# Patient Record
Sex: Male | Born: 1965 | Race: White | Hispanic: No | Smoking: Former smoker
Health system: Southern US, Community
[De-identification: ages and names within clinical notes are randomized; demographics above are authoritative.]

## PROBLEM LIST (undated history)

## (undated) DIAGNOSIS — K860 Alcohol-induced chronic pancreatitis: Secondary | ICD-10-CM

## (undated) DIAGNOSIS — E119 Type 2 diabetes mellitus without complications: Secondary | ICD-10-CM

## (undated) DIAGNOSIS — R609 Edema, unspecified: Secondary | ICD-10-CM

## (undated) DIAGNOSIS — M1990 Unspecified osteoarthritis, unspecified site: Secondary | ICD-10-CM

## (undated) DIAGNOSIS — E785 Hyperlipidemia, unspecified: Secondary | ICD-10-CM

## (undated) DIAGNOSIS — S83242A Other tear of medial meniscus, current injury, left knee, initial encounter: Secondary | ICD-10-CM

## (undated) DIAGNOSIS — F419 Anxiety disorder, unspecified: Secondary | ICD-10-CM

## (undated) DIAGNOSIS — G4730 Sleep apnea, unspecified: Secondary | ICD-10-CM

## (undated) DIAGNOSIS — N529 Male erectile dysfunction, unspecified: Secondary | ICD-10-CM

## (undated) DIAGNOSIS — T7840XA Allergy, unspecified, initial encounter: Secondary | ICD-10-CM

## (undated) DIAGNOSIS — F32A Depression, unspecified: Secondary | ICD-10-CM

## (undated) DIAGNOSIS — E039 Hypothyroidism, unspecified: Secondary | ICD-10-CM

## (undated) DIAGNOSIS — R011 Cardiac murmur, unspecified: Secondary | ICD-10-CM

## (undated) DIAGNOSIS — R002 Palpitations: Secondary | ICD-10-CM

## (undated) DIAGNOSIS — G709 Myoneural disorder, unspecified: Secondary | ICD-10-CM

## (undated) DIAGNOSIS — I10 Essential (primary) hypertension: Secondary | ICD-10-CM

## (undated) DIAGNOSIS — R0789 Other chest pain: Secondary | ICD-10-CM

## (undated) DIAGNOSIS — F329 Major depressive disorder, single episode, unspecified: Secondary | ICD-10-CM

## (undated) DIAGNOSIS — E049 Nontoxic goiter, unspecified: Secondary | ICD-10-CM

## (undated) DIAGNOSIS — K219 Gastro-esophageal reflux disease without esophagitis: Secondary | ICD-10-CM

## (undated) DIAGNOSIS — R0989 Other specified symptoms and signs involving the circulatory and respiratory systems: Secondary | ICD-10-CM

## (undated) DIAGNOSIS — K760 Fatty (change of) liver, not elsewhere classified: Secondary | ICD-10-CM

## (undated) DIAGNOSIS — R072 Precordial pain: Secondary | ICD-10-CM

## (undated) HISTORY — DX: Allergy, unspecified, initial encounter: T78.40XA

## (undated) HISTORY — DX: Precordial pain: R07.2

## (undated) HISTORY — DX: Other tear of medial meniscus, current injury, left knee, initial encounter: S83.242A

## (undated) HISTORY — DX: Cardiac murmur, unspecified: R01.1

## (undated) HISTORY — PX: CARDIAC CATHETERIZATION: SHX172

## (undated) HISTORY — DX: Anxiety disorder, unspecified: F41.9

## (undated) HISTORY — DX: Edema, unspecified: R60.9

## (undated) HISTORY — DX: Major depressive disorder, single episode, unspecified: F32.9

## (undated) HISTORY — PX: WISDOM TOOTH EXTRACTION: SHX21

## (undated) HISTORY — DX: Depression, unspecified: F32.A

## (undated) HISTORY — DX: Sleep apnea, unspecified: G47.30

## (undated) HISTORY — DX: Palpitations: R00.2

## (undated) HISTORY — DX: Other chest pain: R07.89

## (undated) HISTORY — DX: Other specified symptoms and signs involving the circulatory and respiratory systems: R09.89

## (undated) HISTORY — DX: Type 2 diabetes mellitus without complications: E11.9

## (undated) HISTORY — DX: Male erectile dysfunction, unspecified: N52.9

## (undated) HISTORY — DX: Hyperlipidemia, unspecified: E78.5

## (undated) HISTORY — PX: COLONOSCOPY: SHX174

## (undated) HISTORY — PX: TONSILLECTOMY: SUR1361

## (undated) HISTORY — PX: JOINT REPLACEMENT: SHX530

## (undated) HISTORY — DX: Nontoxic goiter, unspecified: E04.9

---
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
## 2000-04-24 HISTORY — PX: APPENDECTOMY: SHX54

## 2004-05-10 ENCOUNTER — Emergency Department: Payer: Self-pay | Admitting: Emergency Medicine

## 2004-05-17 ENCOUNTER — Ambulatory Visit: Payer: Self-pay | Admitting: Internal Medicine

## 2005-01-12 ENCOUNTER — Encounter: Admission: RE | Admit: 2005-01-12 | Discharge: 2005-01-12 | Payer: Self-pay | Admitting: Neurosurgery

## 2005-01-27 ENCOUNTER — Encounter: Admission: RE | Admit: 2005-01-27 | Discharge: 2005-01-27 | Payer: Self-pay | Admitting: Neurosurgery

## 2005-02-09 ENCOUNTER — Encounter: Admission: RE | Admit: 2005-02-09 | Discharge: 2005-02-09 | Payer: Self-pay | Admitting: Neurosurgery

## 2005-06-16 ENCOUNTER — Ambulatory Visit (HOSPITAL_COMMUNITY): Admission: RE | Admit: 2005-06-16 | Discharge: 2005-06-16 | Payer: Self-pay | Admitting: Neurosurgery

## 2005-10-15 ENCOUNTER — Other Ambulatory Visit: Payer: Self-pay

## 2005-10-15 ENCOUNTER — Emergency Department: Payer: Self-pay | Admitting: Emergency Medicine

## 2006-01-12 ENCOUNTER — Ambulatory Visit: Payer: Self-pay | Admitting: Internal Medicine

## 2006-01-19 ENCOUNTER — Ambulatory Visit: Payer: Self-pay | Admitting: Internal Medicine

## 2006-01-25 ENCOUNTER — Encounter: Payer: Self-pay | Admitting: Cardiology

## 2006-01-25 ENCOUNTER — Ambulatory Visit: Payer: Self-pay

## 2006-02-06 ENCOUNTER — Ambulatory Visit (HOSPITAL_COMMUNITY): Admission: RE | Admit: 2006-02-06 | Discharge: 2006-02-06 | Payer: Self-pay | Admitting: Internal Medicine

## 2006-05-02 ENCOUNTER — Emergency Department: Payer: Self-pay | Admitting: Emergency Medicine

## 2007-01-05 ENCOUNTER — Emergency Department: Payer: Self-pay | Admitting: Emergency Medicine

## 2007-01-05 ENCOUNTER — Other Ambulatory Visit: Payer: Self-pay

## 2007-01-08 ENCOUNTER — Ambulatory Visit: Payer: Self-pay | Admitting: Internal Medicine

## 2007-01-08 LAB — CONVERTED CEMR LAB
Basophils Absolute: 0 10*3/uL (ref 0.0–0.1)
Basophils Relative: 0 % (ref 0.0–1.0)
Eosinophils Absolute: 0.2 10*3/uL (ref 0.0–0.6)
Eosinophils Relative: 3.5 % (ref 0.0–5.0)
Free T4: 0.6 ng/dL (ref 0.6–1.6)
HCT: 40.1 % (ref 39.0–52.0)
Hemoglobin: 13.6 g/dL (ref 13.0–17.0)
Lymphocytes Relative: 17 % (ref 12.0–46.0)
MCHC: 33.9 g/dL (ref 30.0–36.0)
MCV: 91.3 fL (ref 78.0–100.0)
Monocytes Absolute: 0.4 10*3/uL (ref 0.2–0.7)
Monocytes Relative: 5.9 % (ref 3.0–11.0)
Neutro Abs: 4.6 10*3/uL (ref 1.4–7.7)
Neutrophils Relative %: 73.6 % (ref 43.0–77.0)
Platelets: 354 10*3/uL (ref 150–400)
RBC: 4.39 M/uL (ref 4.22–5.81)
RDW: 11.8 % (ref 11.5–14.6)
TSH: 1.79 microintl units/mL (ref 0.35–5.50)
WBC: 6.3 10*3/uL (ref 4.5–10.5)

## 2007-01-09 ENCOUNTER — Ambulatory Visit: Payer: Self-pay | Admitting: Pulmonary Disease

## 2007-01-21 ENCOUNTER — Ambulatory Visit: Payer: Self-pay | Admitting: Pulmonary Disease

## 2007-01-21 ENCOUNTER — Ambulatory Visit (HOSPITAL_BASED_OUTPATIENT_CLINIC_OR_DEPARTMENT_OTHER): Admission: RE | Admit: 2007-01-21 | Discharge: 2007-01-21 | Payer: Self-pay | Admitting: Pulmonary Disease

## 2007-03-15 ENCOUNTER — Ambulatory Visit: Payer: Self-pay | Admitting: Pulmonary Disease

## 2007-05-06 ENCOUNTER — Ambulatory Visit: Payer: Self-pay | Admitting: Internal Medicine

## 2007-05-23 ENCOUNTER — Ambulatory Visit: Payer: Self-pay | Admitting: Internal Medicine

## 2007-05-27 ENCOUNTER — Ambulatory Visit: Payer: Self-pay | Admitting: Internal Medicine

## 2007-09-20 ENCOUNTER — Ambulatory Visit: Payer: Self-pay | Admitting: Internal Medicine

## 2007-11-04 ENCOUNTER — Ambulatory Visit: Payer: Self-pay | Admitting: Internal Medicine

## 2007-12-31 ENCOUNTER — Ambulatory Visit: Payer: Self-pay | Admitting: Internal Medicine

## 2008-01-20 ENCOUNTER — Ambulatory Visit: Payer: Self-pay | Admitting: Internal Medicine

## 2008-01-20 LAB — CONVERTED CEMR LAB
Basophils Absolute: 0 10*3/uL (ref 0.0–0.1)
Basophils Relative: 0.7 % (ref 0.0–3.0)
Eosinophils Absolute: 0.2 10*3/uL (ref 0.0–0.7)
Eosinophils Relative: 3.5 % (ref 0.0–5.0)
HCT: 36.2 % — ABNORMAL LOW (ref 39.0–52.0)
Hemoglobin: 12.6 g/dL — ABNORMAL LOW (ref 13.0–17.0)
Lymphocytes Relative: 17.6 % (ref 12.0–46.0)
MCHC: 34.7 g/dL (ref 30.0–36.0)
MCV: 91.6 fL (ref 78.0–100.0)
Monocytes Absolute: 0.5 10*3/uL (ref 0.1–1.0)
Monocytes Relative: 9.1 % (ref 3.0–12.0)
Neutro Abs: 3.7 10*3/uL (ref 1.4–7.7)
Neutrophils Relative %: 69.1 % (ref 43.0–77.0)
Platelets: 319 10*3/uL (ref 150–400)
RBC: 3.95 M/uL — ABNORMAL LOW (ref 4.22–5.81)
RDW: 11.6 % (ref 11.5–14.6)
Sed Rate: 12 mm/hr (ref 0–16)
WBC: 5.3 10*3/uL (ref 4.5–10.5)

## 2008-09-24 ENCOUNTER — Encounter: Payer: Self-pay | Admitting: Internal Medicine

## 2008-10-21 ENCOUNTER — Encounter: Payer: Self-pay | Admitting: Internal Medicine

## 2008-12-29 ENCOUNTER — Emergency Department: Payer: Self-pay

## 2008-12-29 ENCOUNTER — Encounter: Payer: Self-pay | Admitting: Internal Medicine

## 2008-12-30 ENCOUNTER — Telehealth: Payer: Self-pay | Admitting: Internal Medicine

## 2008-12-30 ENCOUNTER — Encounter: Payer: Self-pay | Admitting: Internal Medicine

## 2008-12-31 ENCOUNTER — Encounter (INDEPENDENT_AMBULATORY_CARE_PROVIDER_SITE_OTHER): Payer: Self-pay | Admitting: *Deleted

## 2008-12-31 ENCOUNTER — Ambulatory Visit: Payer: Self-pay | Admitting: Internal Medicine

## 2008-12-31 DIAGNOSIS — I10 Essential (primary) hypertension: Secondary | ICD-10-CM | POA: Insufficient documentation

## 2008-12-31 DIAGNOSIS — R072 Precordial pain: Secondary | ICD-10-CM | POA: Insufficient documentation

## 2009-01-01 LAB — CONVERTED CEMR LAB
BUN: 15 mg/dL (ref 6–23)
CO2: 24 meq/L (ref 19–32)
Calcium: 9.7 mg/dL (ref 8.4–10.5)
Chloride: 101 meq/L (ref 96–112)
Creatinine, Ser: 0.91 mg/dL (ref 0.40–1.50)
Glucose, Bld: 87 mg/dL (ref 70–99)
HCT: 43.5 % (ref 39.0–52.0)
Hemoglobin: 14.2 g/dL (ref 13.0–17.0)
INR: 0.9 (ref 0.0–1.5)
MCHC: 32.6 g/dL (ref 30.0–36.0)
MCV: 94 fL (ref 78.0–100.0)
Platelets: 335 10*3/uL (ref 150–400)
Potassium: 4.3 meq/L (ref 3.5–5.3)
Prothrombin Time: 12.4 s (ref 11.6–15.2)
RBC: 4.63 M/uL (ref 4.22–5.81)
RDW: 13.1 % (ref 11.5–15.5)
Sodium: 138 meq/L (ref 135–145)
WBC: 5.5 10*3/uL (ref 4.0–10.5)

## 2009-01-04 ENCOUNTER — Inpatient Hospital Stay (HOSPITAL_BASED_OUTPATIENT_CLINIC_OR_DEPARTMENT_OTHER): Admission: RE | Admit: 2009-01-04 | Discharge: 2009-01-04 | Payer: Self-pay | Admitting: Internal Medicine

## 2009-01-04 ENCOUNTER — Ambulatory Visit: Payer: Self-pay | Admitting: Internal Medicine

## 2009-01-13 DIAGNOSIS — F411 Generalized anxiety disorder: Secondary | ICD-10-CM | POA: Insufficient documentation

## 2009-01-13 DIAGNOSIS — E785 Hyperlipidemia, unspecified: Secondary | ICD-10-CM

## 2009-01-13 DIAGNOSIS — E049 Nontoxic goiter, unspecified: Secondary | ICD-10-CM | POA: Insufficient documentation

## 2009-01-13 DIAGNOSIS — Z9889 Other specified postprocedural states: Secondary | ICD-10-CM | POA: Insufficient documentation

## 2009-01-13 DIAGNOSIS — I10 Essential (primary) hypertension: Secondary | ICD-10-CM | POA: Insufficient documentation

## 2009-01-13 DIAGNOSIS — Z8679 Personal history of other diseases of the circulatory system: Secondary | ICD-10-CM | POA: Insufficient documentation

## 2009-01-13 DIAGNOSIS — E1169 Type 2 diabetes mellitus with other specified complication: Secondary | ICD-10-CM | POA: Insufficient documentation

## 2009-01-13 DIAGNOSIS — Z9089 Acquired absence of other organs: Secondary | ICD-10-CM | POA: Insufficient documentation

## 2009-01-13 DIAGNOSIS — G4733 Obstructive sleep apnea (adult) (pediatric): Secondary | ICD-10-CM | POA: Insufficient documentation

## 2009-01-21 ENCOUNTER — Ambulatory Visit: Payer: Self-pay | Admitting: Internal Medicine

## 2009-04-24 HISTORY — PX: TOTAL KNEE ARTHROPLASTY: SHX125

## 2009-04-30 ENCOUNTER — Encounter: Payer: Self-pay | Admitting: Internal Medicine

## 2009-06-30 ENCOUNTER — Encounter: Payer: Self-pay | Admitting: Internal Medicine

## 2009-07-13 ENCOUNTER — Encounter: Payer: Self-pay | Admitting: Pulmonary Disease

## 2009-07-23 ENCOUNTER — Other Ambulatory Visit: Payer: Self-pay | Admitting: Internal Medicine

## 2009-08-24 ENCOUNTER — Ambulatory Visit: Payer: Self-pay | Admitting: Internal Medicine

## 2009-09-29 ENCOUNTER — Encounter: Admission: RE | Admit: 2009-09-29 | Discharge: 2009-09-29 | Payer: Self-pay | Admitting: Neurosurgery

## 2009-11-12 ENCOUNTER — Other Ambulatory Visit: Payer: Self-pay | Admitting: Internal Medicine

## 2010-03-22 ENCOUNTER — Other Ambulatory Visit: Payer: Self-pay | Admitting: Internal Medicine

## 2010-05-26 NOTE — Letter (Signed)
Summary: LMN for CPAP Supplies/[facility]  LMN for CPAP Supplies/[facility]   Imported By: Sherian Rein 07/16/2009 12:15:37  _____________________________________________________________________  External Attachment:    Type:   Image     Comment:   External Document

## 2010-05-26 NOTE — Progress Notes (Signed)
Summary: [facility]   Imported By: Harlon Flor 07/05/2009 14:38:53  _____________________________________________________________________  External Attachment:    Type:   Image     Comment:   External Document

## 2010-05-26 NOTE — Letter (Signed)
[facility]   Imported By: Harlon Flor 05/07/2009 16:12:46  _____________________________________________________________________  External Attachment:    Type:   Image     Comment:   External Document

## 2010-08-12 ENCOUNTER — Other Ambulatory Visit: Payer: Self-pay | Admitting: Internal Medicine

## 2010-08-12 LAB — TSH: TSH: 2.72 u[IU]/mL (ref <=5.90)

## 2010-09-06 NOTE — Procedures (Signed)
NAME:  [name], [name] NO.:  [id number]   MEDICAL RECORD NO.:  [id number]           PATIENT TYPE:  OUT   LOCATION:  SLEEP CENTER                 FACILITY:  [facility]   PHYSICIAN:  Coralyn Helling, MD        DATE OF BIRTH:  Mar 23, 1966   DATE OF STUDY:  01/21/2007                            NOCTURNAL POLYSOMNOGRAM   REFERRING PHYSICIAN:   REFERRING PHYSICIAN:  Coralyn Helling, MD.   INDICATION FOR STUDY:  This is an individual, who has a history of  hypertension, sleep disruption, and excessive daytime sleepiness.  He is  referred to the Sleep Lab for evaluation of hypersomnia with obstructive  sleep apnea.   EPWORTH SLEEPINESS SCORE:  Fifteen.   MEDICATIONS:  Wellbutrin-SR, Omeprazole, Lisinopril,  hydrochlorothiazide, Metoprolol, Celexa, Pravastatin, aspirin, Xanax,  Ultram, and a multivitamin.   SLEEP ARCHITECTURE:  Total recording time was 419 minutes, total sleep  time was 357 minutes, sleep efficiency was 85%, sleep latency was 21  minutes, REM latency was 185 minutes.  The patient was observed in all  stages of sleep.  The patient slept in both the supine and non-supine  position.   RESPIRATORY DATA:  The average respiratory rate was 18.  The patient  followed the split night study protocol.  During the diagnostic portion  of the test, he was found to have an apnea/hypopnea index of 18.  The  events were exclusively obstructive in nature.  Loud snoring was noted  by the technician.  It appeared the patient had a preponderance of  events in the supine position.  During the therapeutic portion of the  test, the patient was titrated from a CPAP pressure setting of 5 to 7  centimeters of water.  At a CPAP pressure setting of 7 centimeters of  water, the apnea/hypopnea index was reduced to 2.6.  At this pressure  setting, the patient was observed in both REM sleep and supine sleep,  and snoring was eliminated.   OXYGEN DATA:  The baseline oxygenation was 94%.  The  mean oxygen  saturation nadir was 56%.  At a CPAP pressure setting of 7 centimeters  of water the mean oxygenation during non-REM sleep was 96%, the mean  oxygenation during REM sleep was 95%, the minimal oxygenation during non-  REM sleep was 86%, and the minimal oxygenation during REM sleep was 80%.   CARDIAC DATA:  The rhythm strip showed a sinus rhythm with an average  heart rate of 56 and occasional PVCs.   MOVEMENT-PARASOMNIA:  The periodic limb movement index was 16.   IMPRESSIONS:  This was a split night study protocol.  During the  diagnostic portion of the test, the patient was found to have moderate  obstructive sleep apnea with an apnea/hypopnea index of 18 and an oxygen  saturation of 56%.  During the therapeutic portion of the test, he was  titrated to a continuous positive airway pressure (CPAP) setting of 7  centimeters of water with a reduction in his apnea/hypopnea index to  2.6.  At this pressure setting, he was observed in rapid eye movement  (REM) sleep, supine sleep, and snoring was  eliminated.      Coralyn Helling, MD  Diplomat, American Board of Sleep Medicine  Electronically Signed     VS/MEDQ  D:  01/22/2007 16:03:15  T:  01/22/2007 21:58:06  Job:  [id number]

## 2010-09-06 NOTE — Assessment & Plan Note (Signed)
[facility]                             PULMONARY OFFICE NOTE   [name], Joshua                      MRN:          [id number]  DATE:03/15/2007                            DOB:          1965/06/20    I saw Mr. Delay in follow up for his sleep apnea.   Since his last visit with me, he had undergone an overnight  polysomnogram on January 21, 2007. This was a split-night study  protocol. During the diagnostic portion of the test, he was found to  have moderate obstructive sleep apnea with an apnea/hypopnea index of 18  and an oxygen saturation nadir of 56%. During the therapeutic portion of  the test, he was titrated to a CPAP pressure setting of 7 with a  reduction of his apnea/hypopnea index to 2.6.   He has been since been started on CPAP of 7 cm of water. He said he was  initially tried on a nasal mask but had a problem with mouth leak. He  was then switched to a full face mask, and he has a humidifier. He is  not having any problems as far as nasal congestion, nasal dryness or  mouth dryness. He is not having any problems as far as air leaking from  his mask either. He says that he is sleeping much better and feels like  he has more energy during the day. He will occasionally wake up to find  the mask has been misplaced but then puts it back on and sleeps with it  through the remainder of the night. I had reviewed results of his sleep  study with him. I had again gone over the diagnosis of sleep apnea with  him. I also reviewed alternative treatments for his sleep apnea  including an oral appliance and surgical intervention. However, he  decided that given his good response to CPAP therapy he would like to  continue with this.   I have encouraged him to maintain his compliance with the CPAP. I plan  on following up with him in approximately four to six months. I have,  however, advised him to call me if he has any problems, and if that is  the case, then we could consider having him undergo an auto CPAP  titration study at home to determine if any further adjustments need to  be made on his pressure settings.     Coralyn Helling, MD  Electronically Signed    VS/MedQ  DD: 03/15/2007  DT: 03/16/2007  Job #: [id number]   cc:   Bevelyn Buckles. Bensimhon, MD  Virgel Manifold, M.D.

## 2010-09-06 NOTE — Assessment & Plan Note (Signed)
[facility]                             PULMONARY OFFICE NOTE   [name], [name]                      MRN:          [id number]  DATE:01/09/2007                            DOB:          11/20/1965    REFERRING PHYSICIAN:  Bevelyn Buckles. Bensimhon, MD   I met Mr. [name] today for evaluation of sleep difficulties.   He says his symptoms started about a year ago.  At that time he had  stopped smoking, and since then he has gained approximately 10 pounds.  He says since then he has always felt tired and falls asleep very  easily; for example, when he is watching TV.  He does snore at night and  will occasionally wake up with a choking sensation.  He tends to breathe  through his mouth.  He has trouble sleeping on his back.  He also wakes  up sometimes feeling anxious, although he sometimes relaxes during the  day as well.  He does talk in his sleep, and he will grind his teeth  while he is asleep at night.  His typical sleep pattern is that he goes  to bed between 9:30 and 11 o'clock.  He falls asleep very easily.  He  wakes up once or twice during the night to use the bathroom which he  says is a new occurrence.  He will wake up at 6 in the morning, but  still feels quite tired.  He will sleep in until about 8:00 in the  morning on weekends without much benefit.  He will also occasionally  take a nap, but this does not seem to help him either.  His Epworth  score today is 16/24.  He will occasionally take a Tylenol PM to help  him fall asleep at night, although he does not really need to do this  that often.  He drinks 3-5 cups of coffee during the day as well as  three sodas during the day.  There is no history of restless leg  syndrome.  There is no history of sleep walking.  He denies any history  of nightmares or night terrors, sleep hallucinations, sleep paralysis or  cataplexy.   PAST MEDICAL HISTORY:  Significant for hypertension, elevated  cholesterol, tonsillectomy, and appendectomy.   MEDICATIONS:  1. Lisinopril 20 mg daily.  2. Metoprolol 50 mg q.h.s.  3. Pravastatin daily.  4. Wellbutrin 150 mg b.i.d.  5. Multivitamin daily.   HE HAS AN ALLERGY  TO BIAXIN, which he said caused him to develop  swelling in his tongue.   SOCIAL HISTORY:  He is married.  He works as a Surveyor, quantity.  He quit  smoking in November 2007.  He used to smoke two packs of cigarettes a  day for 20 years.  Has occasional alcohol use.   FAMILY HISTORY:  Significant for his father with heart disease and his  mother who had hypothyroidism.   REVIEW OF SYSTEMS:  Unremarkable.   PHYSICAL EXAMINATION:  VITAL SIGNS:  He is 6 feet tall, 188 pounds,  temperature 98, blood pressure 110/70, heart rate  72, oxygen saturation  98% on room air.  HEENT:  Pupils reactive.  There is no sinus tenderness, no nasal  discharge.  He has a  Mallampati 2 airway with a slight overbite.  NECK:  There is no lymphadenopathy, no thyromegaly.  HEART:  With S1, S2.  CHEST:  No wheezing or rales.  ABDOMEN:  Soft, nontender, positive bowel sounds.  EXTREMITIES:  There is no edema, cyanosis or clubbing.  NEUROLOGICAL:  No focal deficits were appreciated.   IMPRESSION:  Symptoms suggestive of sleep apnea.  This is of particular  concern given the fact that he does have hypertension and is on two  medications for this.  To further evaluate this I will arrange for him  to undergo an overnight polysomnogram.  In the meantime it was discussed  with him the importance of diet, exercise, and weight reduction as well  as the avoidance of alcohol and sedatives.  Driving precautions were  reviewed with him as well.   I will follow up with him after I have a chance to review his sleep  study.     Coralyn Helling, MD  Electronically Signed    VS/MedQ  DD: 01/09/2007  DT: 01/10/2007  Job #: [id number]   cc:   Bevelyn Buckles. Bensimhon, MD

## 2010-09-06 NOTE — Assessment & Plan Note (Signed)
[facility]                            CARDIOLOGY OFFICE NOTE   KEATON, BEICHNER                      MRN:          [id number]  DATE:01/20/2008                            DOB:          1965/10/19    PRIMARY CARE PHYSICIAN:  Duncan Dull, MD   INTERVAL HISTORY:  [name] is a 45 year old male with a history of  hypertension, hyperlipidemia, chest pain with a negative Myoview back in  2007, and sleep apnea.  He presents today for routine followup.   He does not feel well today.  Over the past few months, he has been  having problems with increasing fatigue as well as profuse sweating.  This can happen at any time.  He does have night sweats but also has  sweats during the day or with just minimal activity.  He denies any  chest pain with this.  No significant dyspnea.  He does note  palpitations which occur once or twice a week.  These last a few minutes  and then resolve.   His primary care physician checked his thyroid panel and at both points  his TSH was moderately suppressed.  He had a thyroid scan, apparently  this showed no significant uptake but there seems to be a question of  whether or not he got enough tracer.  He is very concerned about his  thyroid status and asking for a possible referral to an endocrinologist.   CURRENT MEDICATIONS:  1. Lisinopril 20 a day.  2. Pravastatin 40 a day.  3. Wellbutrin 150 b.i.d.  4. Multivitamin.  5. Celexa.  6. Aspirin 81 mg a day.   PHYSICAL EXAMINATION:  GENERAL:  He is no acute distress.  He ambulates  around the clinic without any respiratory difficulty.  VITAL SIGNS:  Blood pressure is 115/60, heart rate 78, and weight is  193.  HEENT:  Normal.  NECK:  Supple.  There is no JVD.  Carotids are 2+ bilaterally without  bruits.  There appears to be a question of a mild goiter particularly on  the left.  I do not appreciate any lymphadenopathy.  CARDIAC:  PMI is nondisplaced.  Regular rate and  rhythm.  No murmurs,  rubs, or gallops.  LUNGS:  Clear.  ABDOMEN:  Soft, nontender, and nondistended.  No hepatosplenomegaly.  No  bruits.  No masses appreciated.  Good bowel sounds.  EXTREMITIES:  Warm with no cyanosis, clubbing, or edema.  Good pulses.  No rashes.  NEURO:  Alert and oriented x3.  Cranial nerves II through XII are  intact.  Moves all 4  extremities without difficulty.  Affect is  pleasant.   EKG shows sinus rhythm at a rate of 78.  No ST-T wave abnormalities.   ASSESSMENT/PLAN:  1. Fatigue and sweats, this is of unclear etiology.  We did discuss      the possibility of this being related to his thyroid; however with      suppressed TSH, I would suspect hyperthyroidism unless he had      overall failure of the pituitary and thyroid axis.  For his  request, I have referred him to the [facility] and      he is scheduled see Dr. Rosanne Gutting on February 05, 2008.  2. Palpitations.  We will put a monitor on him to further evaluate.  3. Sweats.  I am also concerned that possibly he may have an      underlying illness here.  He did say that on his most recent lab      work, his white count was a little bit low.  We will send off an      LDH, a sed rate, and also check a CBC with a manual diff.  I did      not at that time discuss with him about possible HIV panel.      However, if he does once again have leukopenia, I think this would      be reasonable to pursue as well.  4. Cardiac status.  On reviewing his Myoview from a few years ago, he      did have significant ST changes; however, the perfusion images were      totally normal.  I doubt this is cardiac, but he does have a      worsening exercise tolerance and exertional sweating.  Should his      remainder of his workup be negative, we did discuss the possibility      of cardiac catheterization down the road to clearly define his      coronary anatomy.     Bevelyn Buckles. Bensimhon, MD   Electronically Signed    DRB/MedQ  DD: 01/20/2008  DT: 01/21/2008  Job #: [id number]   cc:   Duncan Dull, M.D.  Alessandra Bevels. Julien Girt, MD

## 2010-09-06 NOTE — Assessment & Plan Note (Signed)
[facility]                            CARDIOLOGY OFFICE NOTE   [name], [name]                      MRN:          [id number]  DATE:01/08/2007                            DOB:          Jan 08, 1966    PRIMARY CARE PHYSICIAN:   INTERVAL HISTORY:  [name] is a very pleasant 45 year old male who is  married to [name], whom we follow in the Heart Failure Clinic.  He  has a history of hypertension, hyperlipidemia, and chest pain.  He  recently underwent an echocardiogram and Myoview, which were both  negative.  He presents today for routine followup.   He denies any further chest pain or shortness of breath.  His main  complaint is profound fatigue. He says he can fall asleep just at any  point.  His wife says he snores quite a bit, but denies any awaking to  apnea.  The last visit, we did notice a mild goiter.  He had a thyroid  ultrasound, which was negative.  His T4 was mildly low.  The TSH was in  a normal range.  He did follow up with his primary care physician, who  felt that this was within the normal range.   CURRENT MEDICATIONS:  1. Wellbutrin 150 b.i.d.  2. Celexa.  3. Lopressor 50 a day.  4. Aspirin 81.  5. Multivitamin.  6. Pravastatin 40.  7. Lisinopril/hydrochlorothiazide 20/25.   PHYSICAL EXAMINATION:  He is in no acute distress.  Ambulates around the  clinic without any respiratory difficulty.  Affect is pleasant.  Blood pressure 120/86.  Heart rate is 82.  Weight is 186.  HEENT:  Normal.  NECK:  Supple.  There is a mild goiter.  There is no obvious nodules.  There is no JVD.  Carotids are 2+ bilaterally without any bruits.  CARDIAC:  PMI is not displaced.  Regular rate and rhythm.  No murmurs,  rubs, or gallops.  LUNGS:  Clear.  ABDOMEN:  Soft, nontender, and non-distended.  No hepatosplenomegaly.  No bruits.  No masses.  Good bowel sounds.  EXTREMITIES:  Warm with no cyanosis, clubbing, or edema.  Good pulses.  No  rash.  His nails are normal.  NEUROLOGIC:  Alert and oriented x3.  Cranial nerves 2 through 12 grossly  intact.  Moves all  4 extremities without difficulty.  Affect is very  pleasant.  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  No acute ST-T wave abnormalities.   ASSESSMENT AND PLAN:  1. Fatigue.  I am very suspicious for sleep apnea.  We will refer him      to Dr. Coralyn [name] for possible sleep study.  We will also check a      CBC and a TSH, and free T4.  2. Chest pain.  This is resolved.  Stress test is reassuring.  3. Hypertension.  Well controlled.  4. Hyperlipidemia.  Followed by Dr. Arlana [name].   DISPOSITION:  We will see him back in clinic for routine followup in  several months.  [name]. Bensimhon, MD  Electronically Signed    DRB/MedQ  DD: 01/08/2007  DT: 01/09/2007  Job #: [id number]   cc:   Joshua Helling, MD  Joshua Salina, MD

## 2010-09-06 NOTE — Assessment & Plan Note (Signed)
[facility]                            CARDIOLOGY OFFICE NOTE   [name], [name]                      MRN:          [id number]  DATE:05/06/2007                            DOB:          12-Aug-1965    PRIMARY CARE PHYSICIAN:  Dr. Dewaine Oats.   INTERVAL HISTORY:  [name] is a very pleasant 45 year old male married  to [name] who we follow in the heart failure clinic.  He has a  history of hypertension, hyperlipidemia, chest pain and sleep apnea.  He  has had a recent negative Myoview.  He presents today for routine  followup.   He has been feeling much better since starting his CPAP.  He has much  more energy.  No chest pain or shortness of breath.   CURRENT MEDICATIONS:  1. Lisinopril 20 daily.  2. Metoprolol 50 daily.  3. Pravastatin 40 daily.  4. Wellbutrin 150 mg b.i.d.  5. Multivitamin.  6. Celexa.  7. Aspirin 81 a day.   PHYSICAL EXAMINATION:  GENERAL APPEARANCE:  He is well-appearing in no  acute distress.  He ambulates around the clinic without any respiratory  difficulty.  VITAL SIGNS:  Blood pressure is 126/72, heart rate is 59, weight is 196.  HEENT:  Normal.  NECK:  Supple.  Question of mild goiter.  No jugular venous distention.  Carotids are 2+ bilaterally without any bruits.  There is no  lymphadenopathy.  CARDIAC:  PMI is nondisplaced.  There is a regular rate and rhythm.  No  murmurs, rubs or gallops.  LUNGS:  Clear.  ABDOMEN:  Soft, nontender, nondistended.  No hepatosplenomegaly.  No  bruits.  No masses.  Good bowel sounds.  EXTREMITIES:  Are warm with no cyanosis, clubbing or edema.  Good  pulses.  No rashes.  NEUROLOGICAL:  Alert and oriented x3.  Cranial nerves II-XII are intact.  Moves all four extremities without difficulty.  Affect is pleasant.   CLINICAL DATA:  EKG shows sinus bradycardia at a rate of 59 with left  anterior fascicular block and nonspecific T wave changes, flattening  laterally.   ASSESSMENT AND PLAN:  1. Fatigue.  This is much improved with treatment of his sleep apnea.  2. Hypertension, well controlled.  3. Mildly abnormal electrocardiogram; T waves are slightly different      from previous, but not      diagnostically so.  He has recently had a normal Myoview.  We will      continue to follow and consider repeat stress testing in 6 months      when he returns.     Bevelyn Buckles. Bensimhon, MD  Electronically Signed    DRB/MedQ  DD: 05/06/2007  DT: 05/07/2007  Job #: [id number]   cc:   Dewaine Oats

## 2010-09-09 NOTE — Assessment & Plan Note (Signed)
[facility]                              CARDIOLOGY OFFICE NOTE   TERRIS, GERMANO                      MRN:          [id number]  DATE:01/12/2006                            DOB:          1966/01/03    PATIENT IDENTIFICATION:  Mr. Agne is very pleasant 45 year old male with  history of hypertension and hyperlipidemia who presents for a new patient  evaluation.   PAST MEDICAL HISTORY:  1. Hypertension.  2. Hyperlipidemia.      a.     Cholesterol from May of 2007, total cholesterol 187,       triglycerides 62, HDL 31, LDL 144.  3. History of chest pain, no previous catheterizations or stress test.  4. Depression.   CURRENT MEDICATIONS:  1. Wellbutrin 150 a day.  2. Celexa.  3. Lopressor 50 once a day.  4. Aspirin 81 mg a day.   ALLERGIES:  VICODIN.   SOCIAL HISTORY:  Lives in [place] with his wife and two children.  He  works as a Production designer, theatre/television/film man.  He smokes one pack of tobacco a day for 20  years.  Denies any significant alcohol intake.   HISTORY OF PRESENT ILLNESS:  Mr. Ausborn is a very pleasant 45 year old male  as above.  He denies any known history of coronary artery disease.  He has  never had a stress test or cardiac catheterization.  Several months ago, he  presented to the ER not feeling well.  He was found to have a blood pressure  in the 190/110 range.  He was started on Lopressor.  He recently came to the  Heart Failure Clinic with his wife Archie Patten and discussed his blood pressure  with Dorian Pod, ACNP.  He was referred here.  He also has a history of  chest pain in September of 2006 and was evaluated in [facility] Emergency  Room.  He was advised to stay overnight for a stress test; however, he  refused.  He has had occasional chest pain since that time but no clear  exertional symptoms.  He does stay fairly active at work, though he does not  exercise on a regular basis.  He also recently had an EKG and was noted  to  have a left axis deviation.  He was also concerned about that.   REVIEW OF SYSTEMS:  He denies orthopnea, PND, lower extremity edema,  palpitations, syncope, presyncope.  No bright red blood per rectum, no  melena, no claudication.  The remainder of the review of systems is negative  except for HPI and problem list.   PHYSICAL EXAMINATION:  He is well-appearing in no acute distress.  Ambulates  around the clinic quickly with no respiratory distress.  Blood pressure is  174/78 with a heart rate of 70.  His weight is 183.  HEENT:  Sclerae  anicteric.  EOMI.  There is no xanthelasma.  Mucus membranes are moist.  Neck has a very prominent, smooth goiter.  No JVD.  Carotids are 2+  bilaterally.  He has a soft bruit on the right.  CARDIAC:  Regular rate and rhythm with a 2/6 systolic ejection murmur at the  right sternal border.  S2 is well preserved.  There is no rub or gallop.  LUNGS:  Clear.  ABDOMEN:  Soft, nontender, and nondistended.  No hepatosplenomegaly, no  bruits, no masses.  EXTREMITIES:  Warm with no clubbing, cyanosis, or edema.  NEUROLOGICAL:  He is alert and oriented x3.  Cranial nerves II through XII  are intact.  Moves all four extremities without difficulty.  Affect is  bright.   EKG shows normal sinus rhythm with a left anterior hemiblock and a  nonspecific intraventricular conduction delay at 104 msec.  No significant  ST-T waves abnormalities.   ASSESSMENT/PLAN:  1. Hypertension:  This is poorly controlled.  We will start him on      lisinopril/HCTZ 20/25 mg a day.  We will check a BMET in one week.  See      him back in three weeks to continue titrating his medications.  2. Hyperlipidemia:  Start Pravastatin 40 a day.  3. Chest pain:  This is fairly atypical but he does have a significant      amount of risk factors.  We will proceed with a treadmill Myoview.  4. Goiter:  Check thyroid-stimulating hormones and free T4 and also      thyroid ultrasound.  5.  Left bruit:  He will need a carotid ultrasound.  6. Murmur:  Suspect this is a benign outflow tract murmur.  He will need      an echocardiogram to definitively evaluate.   DISPOSITION:  Return to clinic in three weeks for further evaluation.  I did  spend a significant amount of time explaining his various medical conditions  to both him and his wife.                                Bevelyn Buckles. Bensimhon, MD    DRB/MedQ  DD:  01/12/2006  DT:  01/15/2006  Job #:  [id number]

## 2010-10-03 ENCOUNTER — Telehealth: Payer: Self-pay | Admitting: *Deleted

## 2010-10-03 DIAGNOSIS — R609 Edema, unspecified: Secondary | ICD-10-CM

## 2010-10-03 NOTE — Telephone Encounter (Signed)
Per wife pt has been having lower ext edema and fatigue per Dr Gala Romney sch echo, order placed pt's wife will sch

## 2010-10-05 ENCOUNTER — Encounter: Payer: Self-pay | Admitting: *Deleted

## 2010-10-07 ENCOUNTER — Ambulatory Visit (HOSPITAL_COMMUNITY): Payer: BC Managed Care – PPO | Attending: Internal Medicine

## 2010-10-07 DIAGNOSIS — I059 Rheumatic mitral valve disease, unspecified: Secondary | ICD-10-CM | POA: Insufficient documentation

## 2010-10-07 DIAGNOSIS — R609 Edema, unspecified: Secondary | ICD-10-CM

## 2010-10-07 DIAGNOSIS — I10 Essential (primary) hypertension: Secondary | ICD-10-CM | POA: Insufficient documentation

## 2010-10-07 DIAGNOSIS — R5381 Other malaise: Secondary | ICD-10-CM | POA: Insufficient documentation

## 2010-10-07 DIAGNOSIS — E785 Hyperlipidemia, unspecified: Secondary | ICD-10-CM | POA: Insufficient documentation

## 2010-10-07 DIAGNOSIS — I079 Rheumatic tricuspid valve disease, unspecified: Secondary | ICD-10-CM | POA: Insufficient documentation

## 2010-10-13 ENCOUNTER — Telehealth: Payer: Self-pay | Admitting: Internal Medicine

## 2010-10-13 NOTE — Telephone Encounter (Signed)
Pt aware of results 

## 2010-10-13 NOTE — Telephone Encounter (Signed)
Pt would like results of echo °

## 2010-10-19 ENCOUNTER — Telehealth: Payer: Self-pay | Admitting: *Deleted

## 2010-10-19 DIAGNOSIS — R0602 Shortness of breath: Secondary | ICD-10-CM

## 2010-10-19 NOTE — Telephone Encounter (Signed)
Pt is aware order placed for CT scan

## 2010-10-19 NOTE — Telephone Encounter (Signed)
Message copied by Noralee Space on Wed Oct 19, 2010  3:11 PM ------      Message from: Dolores Patty      Created: Sun Oct 16, 2010 10:24 PM       RV is dilated. Given edema and dyspnea needs chest CT with contrast to exclude PE. Set him up for appt to see me afterwards.

## 2010-10-20 ENCOUNTER — Ambulatory Visit (INDEPENDENT_AMBULATORY_CARE_PROVIDER_SITE_OTHER)
Admission: RE | Admit: 2010-10-20 | Discharge: 2010-10-20 | Disposition: A | Discharge status: Home / Self Care | Payer: BC Managed Care – PPO | Source: Ambulatory Visit | Attending: Cardiology | Admitting: Cardiology

## 2010-10-20 ENCOUNTER — Encounter: Payer: Self-pay | Admitting: *Deleted

## 2010-10-20 DIAGNOSIS — R0602 Shortness of breath: Secondary | ICD-10-CM

## 2010-10-20 HISTORY — DX: Essential (primary) hypertension: I10

## 2010-10-20 MED ORDER — IOHEXOL 300 MG/ML  SOLN
100.0000 mL | Freq: Once | INTRAMUSCULAR | Status: AC | PRN
  Administered 2010-10-20: 100 mL via INTRAVENOUS

## 2010-11-09 ENCOUNTER — Encounter: Payer: Self-pay | Admitting: Internal Medicine

## 2010-12-08 ENCOUNTER — Encounter: Payer: Self-pay | Admitting: Internal Medicine

## 2010-12-15 ENCOUNTER — Ambulatory Visit (INDEPENDENT_AMBULATORY_CARE_PROVIDER_SITE_OTHER): Payer: BC Managed Care – PPO | Admitting: Pulmonary Disease

## 2010-12-15 ENCOUNTER — Encounter: Payer: Self-pay | Admitting: Pulmonary Disease

## 2010-12-15 VITALS — BP 120/8 | HR 70 | Temp 98.4°F | Ht 72.0 in | Wt 225.8 lb

## 2010-12-15 DIAGNOSIS — G4730 Sleep apnea, unspecified: Secondary | ICD-10-CM

## 2010-12-15 DIAGNOSIS — G4762 Sleep related leg cramps: Secondary | ICD-10-CM | POA: Insufficient documentation

## 2010-12-15 NOTE — Assessment & Plan Note (Signed)
He has noticed more frequent leg cramps.  This could be related to worsening sleep apnea.  Will reassess after his sleep apnea is controlled better.

## 2010-12-15 NOTE — Patient Instructions (Signed)
Will check CPAP settings at home with [facility] Follow up in 3 months

## 2010-12-15 NOTE — Assessment & Plan Note (Signed)
He has history of moderate sleep apnea.  He had initial good response to CPAP.  He has gained weight and noticed more trouble with his sleep since.  I suspect he needs to have his CPAP settings adjusted.  Will arrange for auto CPAP titration at home.  If this is unsuccessful, he will need an in lab study.  Emphasized the importance of weight loss.

## 2010-12-15 NOTE — Progress Notes (Signed)
  Subjective:    Patient ID: [name], male    DOB: 12/08/1965, 45 y.o.   MRN: [id number]  HPI 45 yo male with OSA.  I last saw [name] in 2008.  He had sleep study from 01/21/07>>AHI 18, SpO2 low 56%.  CPAP 7 cm H2O>>AHI 2.6.  He was started on CPAP, but did not follow up after that.  He goes to bed at 9 pm.  He takes about 30 minutes to fall asleep.  He wakes up several times during the night.  He gets out of bed at 630 am.  He does not feel like he gets the same pressure from his machine.  His wife hears him snoring some again.  He will occasionally talk in his sleep.  The patient denies sleep walking, bruxism, or nightmares.  There is no history of restless legs.  The patient denies sleep hallucinations, sleep paralysis, or cataplexy.  He has gained about 25 lbs.  He quit smoking.  He has occasional alcohol use.  He is not using anything to help him fall asleep. He drinks a Pepsi in the morning.  Epworth score is 15 out of 24.  Past Medical History  Diagnosis Date  . Hypertension     essential, benign  . Anxiety and depression   . Chest pain     hx  . Sleep apnea   . Goiter   . HLD (hyperlipidemia)   . Palpitation     hx  . Other chest pain     tightness, pressure  . Precordial pain   . Murmur   . Bruit     L  . Impotence of organic origin   . Edema      Family History  Problem Relation Age of Onset  . Heart disease Father      History   Social History  . Marital Status: Married   Occupational History  . Maintenance supervisor    Social History Main Topics  . Smoking status: Former Smoker -- 2.0 packs/day for 18 years    Types: Cigarettes    Quit date: 04/24/2005   Comment: quit 20 years ago   . Alcohol Use: Yes     1 a day  . Drug Use: No  . Sexually Active: Not on file    Social History Narrative   Married, gets regular exercise.      No Known Allergies   Review of Systems  Respiratory: Positive for shortness of breath.     Psychiatric/Behavioral: Positive for dysphoric mood.       Objective:   Physical Exam BP 120/8  Pulse 70  Temp(Src) 98.4 F (36.9 C) (Oral)  Ht 6' (1.829 m)  Wt 225 lb 12.8 oz (102.422 kg)  BMI 30.62 kg/m2  SpO2 96%  General - Obese HEENT - PERRLA, EOMI, no sinus tenderness, no oral exudate, MP 3, no LAN, no thyromegaly Cardiac - s1s2 regular, no murmur, pulses symmetric Chest - no wheeze/rales Abd - obese, soft, nontender Ext - no e/c/c Neuro - normal strength, CN intact Psych - normal mood/behavior     Assessment & Plan:

## 2010-12-23 ENCOUNTER — Encounter: Payer: Self-pay | Admitting: Internal Medicine

## 2010-12-23 ENCOUNTER — Ambulatory Visit (INDEPENDENT_AMBULATORY_CARE_PROVIDER_SITE_OTHER): Payer: BC Managed Care – PPO | Admitting: Internal Medicine

## 2010-12-23 ENCOUNTER — Other Ambulatory Visit: Payer: BC Managed Care – PPO

## 2010-12-23 VITALS — BP 129/84 | HR 97 | Temp 98.3°F | Resp 16 | Ht 72.0 in | Wt 224.5 lb

## 2010-12-23 DIAGNOSIS — M5431 Sciatica, right side: Secondary | ICD-10-CM

## 2010-12-23 DIAGNOSIS — R5383 Other fatigue: Secondary | ICD-10-CM

## 2010-12-23 DIAGNOSIS — I10 Essential (primary) hypertension: Secondary | ICD-10-CM

## 2010-12-23 DIAGNOSIS — E538 Deficiency of other specified B group vitamins: Secondary | ICD-10-CM

## 2010-12-23 DIAGNOSIS — M5430 Sciatica, unspecified side: Secondary | ICD-10-CM

## 2010-12-23 DIAGNOSIS — M2550 Pain in unspecified joint: Secondary | ICD-10-CM

## 2010-12-23 DIAGNOSIS — R5381 Other malaise: Secondary | ICD-10-CM

## 2010-12-23 DIAGNOSIS — Z9889 Other specified postprocedural states: Secondary | ICD-10-CM

## 2010-12-23 NOTE — Patient Instructions (Signed)
We will check bloodwork today to look for inflammatory arthritis.  We are scheduling you for an MRI of the lumbar spine and an x ray of the right shoulder.

## 2010-12-23 NOTE — Progress Notes (Signed)
  Subjective:    Patient ID: [name], male    DOB: May 01, 1965, 45 y.o.   MRN: [id number]  HPI 45 yo wi chief complaint of chronic fatigue since his thyroid disorder was daignosed,  2-3 yrs. Uses CPAP for sleep apnea and workup is underway to check setting,  Dr. Craige Cotta.  Occasional insomnia lasting an hour or so.  Also having burning sensatio nin right hip for the last 3 months, unprovoked,  Right shoulder has constant pain for 1 years , unprovoked. Both joints become more painful as the day goes on.  Also both knees somewhat painful, r knee replacement jan 2011  More painful.    Past Medical History  Diagnosis Date  . Hypertension     essential, benign  . Anxiety and depression   . Chest pain     hx  . Sleep apnea   . Goiter   . HLD (hyperlipidemia)   . Palpitation     hx  . Other chest pain     tightness, pressure  . Precordial pain   . Murmur   . Bruit     L  . Impotence of organic origin   . Edema       Review of Systems     Objective:  BP 129/84  Pulse 97  Temp(Src) 98.3 F (36.8 C) (Oral)  Resp 16  Ht 6' (1.829 m)  Wt 224 lb 8 oz (101.833 kg)  BMI 30.45 kg/m2  SpO2 96%    Physical Exam  Constitutional: He is oriented to person, place, and time.  HENT:  Head: Normocephalic and atraumatic.  Mouth/Throat: Oropharynx is clear and moist.  Eyes: Conjunctivae and EOM are normal.  Neck: Normal range of motion. Neck supple. No JVD present. No thyromegaly present.  Cardiovascular: Normal rate, regular rhythm and normal heart sounds.   Pulmonary/Chest: Effort normal and breath sounds normal. He has no wheezes. He has no rales.  Abdominal: Soft. Bowel sounds are normal. He exhibits no mass. There is no tenderness. There is no rebound.  Musculoskeletal: He exhibits tenderness. He exhibits no edema.       right shoulder movements are painful with abduction and internal rotation Right hip nontender but positive straight leg lift  Neurological: He is alert and  oriented to person, place, and time. He has normal reflexes.  Skin: Skin is warm and dry.  Psychiatric: He has a normal mood and affect.         Assessment & Plan:

## 2010-12-24 ENCOUNTER — Encounter: Payer: Self-pay | Admitting: Internal Medicine

## 2010-12-24 DIAGNOSIS — M2550 Pain in unspecified joint: Secondary | ICD-10-CM | POA: Insufficient documentation

## 2010-12-24 DIAGNOSIS — G8929 Other chronic pain: Secondary | ICD-10-CM | POA: Insufficient documentation

## 2010-12-24 DIAGNOSIS — M5430 Sciatica, unspecified side: Secondary | ICD-10-CM | POA: Insufficient documentation

## 2010-12-24 DIAGNOSIS — M5386 Other specified dorsopathies, lumbar region: Secondary | ICD-10-CM | POA: Insufficient documentation

## 2010-12-24 DIAGNOSIS — M5431 Sciatica, right side: Secondary | ICD-10-CM | POA: Insufficient documentation

## 2010-12-24 DIAGNOSIS — R5383 Other fatigue: Secondary | ICD-10-CM | POA: Insufficient documentation

## 2010-12-24 LAB — RHEUMATOID FACTOR: Rhuematoid fact SerPl-aCnc: <10 IU/mL (ref <=14)

## 2010-12-24 LAB — CBC WITH DIFFERENTIAL/PLATELET
Basophils Absolute: 0 10*3/uL (ref 0.0–0.1)
Eosinophils Relative: 3 % (ref 0–5)
HCT: 41 % (ref 39.0–52.0)
Hemoglobin: 13.5 g/dL (ref 13.0–17.0)
Lymphocytes Relative: 20 % (ref 12–46)
Lymphs Abs: 0.9 10*3/uL (ref 0.7–4.0)
MCV: 95.1 fL (ref 78.0–100.0)
Monocytes Absolute: 0.5 10*3/uL (ref 0.1–1.0)
Monocytes Relative: 10 % (ref 3–12)
Neutro Abs: 3.1 10*3/uL (ref 1.7–7.7)
RDW: 13.1 % (ref 11.5–15.5)
WBC: 4.7 10*3/uL (ref 4.0–10.5)

## 2010-12-24 LAB — VITAMIN B12: Vitamin B-12: 338 pg/mL (ref 211–911)

## 2010-12-24 NOTE — Assessment & Plan Note (Signed)
Well controlled on current medications.  No changes today. 

## 2010-12-24 NOTE — Assessment & Plan Note (Signed)
He continues to have knee pain aggravated by prolonged weight bearing.

## 2010-12-24 NOTE — Assessment & Plan Note (Signed)
Given his low back pain, right hip pain with positive straight leg lift. Will order MRI of lumbar spine to rule out compormised nerve root.

## 2010-12-24 NOTE — Assessment & Plan Note (Addendum)
He has multiple reasons for persistent fatigue, including anxiety aggravated by his wife Tanya's medical condition and prognosis, sleep apnea, chronic pain, and overweight with lack of regular  physical activity.  Will rule out anemia and autoimmune inflammatory arthritis as contributors.

## 2010-12-24 NOTE — Assessment & Plan Note (Addendum)
He has multiple large joint complaints with creiptus and limited ROM noted on exam.  No obvious synovitis but he is requesting workup for RA and SLE.  Labs ordered includign ESR, CRP ANA RF and anti CCP.

## 2010-12-27 ENCOUNTER — Telehealth: Payer: Self-pay | Admitting: Internal Medicine

## 2010-12-27 NOTE — Telephone Encounter (Signed)
Please call patient with lab results

## 2011-01-04 ENCOUNTER — Telehealth: Payer: Self-pay | Admitting: Pulmonary Disease

## 2011-01-04 NOTE — Telephone Encounter (Signed)
I spoke with pt and he states his DME company was faxing over his cpap download. Pt is requesting these results. Pt aware Dr. Craige Cotta is out of the office this week. Please advise Dr. Craige Cotta. Thanks  Carver Fila, CMA

## 2011-01-09 ENCOUNTER — Telehealth: Payer: Self-pay | Admitting: Pulmonary Disease

## 2011-01-09 ENCOUNTER — Encounter: Payer: Self-pay | Admitting: Pulmonary Disease

## 2011-01-09 NOTE — Telephone Encounter (Signed)
Duplicate msg.

## 2011-01-09 NOTE — Telephone Encounter (Signed)
Left message for patient to return my call.

## 2011-01-10 NOTE — Telephone Encounter (Signed)
Patient notified by Mikhaila Roh W. 

## 2011-01-10 NOTE — Telephone Encounter (Signed)
I spoke with [facility] and they state they are going to fax over download now to triage fax. Will await fax.

## 2011-01-10 NOTE — Telephone Encounter (Signed)
I have not received report yet.  Can we confirm that DME send report.

## 2011-01-10 NOTE — Telephone Encounter (Signed)
Please advise Dr. Craige Cotta I have placed these results in your look at. Thanks  Carver Fila, CMA

## 2011-01-12 ENCOUNTER — Telehealth: Payer: Self-pay | Admitting: Pulmonary Disease

## 2011-01-12 ENCOUNTER — Encounter: Payer: Self-pay | Admitting: Pulmonary Disease

## 2011-01-12 DIAGNOSIS — G4733 Obstructive sleep apnea (adult) (pediatric): Secondary | ICD-10-CM

## 2011-01-12 NOTE — Telephone Encounter (Signed)
Auto CPAP 12/19/10 to 12/29/10>>Used on 9 of 10 nights with average 6 hrs 41 min.  Optimal pressure 14 cm H2O with average AHI 2.7.  Results d/w pt over phone.  Will increase CPAP to 14 cm H2O.

## 2011-01-22 ENCOUNTER — Ambulatory Visit: Payer: Self-pay | Admitting: Internal Medicine

## 2011-01-24 ENCOUNTER — Telehealth: Payer: Self-pay | Admitting: Internal Medicine

## 2011-01-24 NOTE — Telephone Encounter (Signed)
Notified patient of results.  He stated he wanted to call me back about the PT and where he would like to go for that.

## 2011-01-24 NOTE — Telephone Encounter (Signed)
Please review phone note from 01/12/11.  I spoke with pt about download results then.

## 2011-01-24 NOTE — Telephone Encounter (Signed)
Message copied by Edd Fabian on Tue Jan 24, 2011  5:58 PM ------      Message from: Duncan Dull      Created: Tue Jan 24, 2011  1:16 PM      Regarding: MRI results        [name]'s lumbar MRI showed only mild disk herniation at L2 and L3 levels with no involvement of the spinal cord or the nerve roots. I wouel recommend PT for back strengthening exercises and trial of a TENS Unit./

## 2011-01-24 NOTE — Telephone Encounter (Signed)
Dr.  Craige Cotta, have you had a chance to review these results yet?  Pls advise.  Thanks!

## 2011-01-27 ENCOUNTER — Other Ambulatory Visit: Payer: Self-pay | Admitting: *Deleted

## 2011-01-27 MED ORDER — LEVOTHYROXINE SODIUM 88 MCG PO TABS
88.0000 ug | ORAL_TABLET | Freq: Every day | ORAL | Status: DC
Start: 2011-01-27 — End: 2011-03-22

## 2011-02-10 ENCOUNTER — Encounter: Payer: Self-pay | Admitting: Internal Medicine

## 2011-02-13 ENCOUNTER — Other Ambulatory Visit: Payer: Self-pay | Admitting: Internal Medicine

## 2011-02-13 MED ORDER — HYDROCODONE-ACETAMINOPHEN 7.5-750 MG PO TABS: 1.0000 | ORAL_TABLET | Freq: Four times a day (QID) | ORAL | Status: DC | PRN

## 2011-02-21 ENCOUNTER — Other Ambulatory Visit: Payer: Self-pay | Admitting: Internal Medicine

## 2011-02-21 MED ORDER — ALPRAZOLAM 0.5 MG PO TABS: 0.5000 mg | ORAL_TABLET | Freq: Every evening | ORAL | Status: DC | PRN

## 2011-02-24 ENCOUNTER — Other Ambulatory Visit: Payer: Self-pay | Admitting: Internal Medicine

## 2011-02-24 MED ORDER — LISINOPRIL-HYDROCHLOROTHIAZIDE 20-25 MG PO TABS: 1.0000 | ORAL_TABLET | Freq: Every day | ORAL | Status: DC

## 2011-03-13 ENCOUNTER — Other Ambulatory Visit: Payer: Self-pay | Admitting: Internal Medicine

## 2011-03-13 MED ORDER — METOPROLOL TARTRATE 25 MG PO TABS: 25.0000 mg | ORAL_TABLET | Freq: Two times a day (BID) | ORAL | Status: DC

## 2011-03-22 ENCOUNTER — Other Ambulatory Visit: Payer: Self-pay | Admitting: Internal Medicine

## 2011-03-22 MED ORDER — ARIPIPRAZOLE 5 MG PO TABS: 5.0000 mg | ORAL_TABLET | Freq: Every day | ORAL | Status: DC

## 2011-03-22 MED ORDER — BUPROPION HCL ER (SR) 150 MG PO TB12: 150.0000 mg | ORAL_TABLET | Freq: Two times a day (BID) | ORAL | Status: DC

## 2011-03-22 MED ORDER — LEVOTHYROXINE SODIUM 88 MCG PO TABS: 88.0000 ug | ORAL_TABLET | Freq: Every day | ORAL | Status: DC

## 2011-03-24 ENCOUNTER — Other Ambulatory Visit: Payer: Self-pay | Admitting: Internal Medicine

## 2011-03-24 MED ORDER — LISINOPRIL-HYDROCHLOROTHIAZIDE 20-25 MG PO TABS: 1.0000 | ORAL_TABLET | Freq: Every day | ORAL | Status: DC

## 2011-05-25 ENCOUNTER — Other Ambulatory Visit: Payer: Self-pay | Admitting: *Deleted

## 2011-05-25 MED ORDER — CITALOPRAM HYDROBROMIDE 20 MG PO TABS: 20.0000 mg | ORAL_TABLET | Freq: Every day | ORAL | Status: DC

## 2011-05-25 NOTE — Telephone Encounter (Signed)
Faxed request from [facility] [address], last filled 04/22/11.

## 2011-06-07 ENCOUNTER — Inpatient Hospital Stay: Payer: Self-pay | Admitting: Student

## 2011-06-07 LAB — URINALYSIS, COMPLETE
Blood: NEGATIVE
Glucose,UR: NEGATIVE mg/dL (ref 0–75)
Leukocyte Esterase: NEGATIVE
Nitrite: NEGATIVE
Ph: 5 (ref 4.5–8.0)
Protein: NEGATIVE
RBC,UR: NONE SEEN /HPF (ref 0–5)
WBC UR: 4 /HPF (ref 0–5)

## 2011-06-07 LAB — COMPREHENSIVE METABOLIC PANEL
Alkaline Phosphatase: 79 U/L (ref 50–136)
Anion Gap: 10 (ref 7–16)
BUN: 19 mg/dL — ABNORMAL HIGH (ref 7–18)
Bilirubin,Total: 1 mg/dL (ref 0.2–1.0)
Calcium, Total: 9.7 mg/dL (ref 8.5–10.1)
Chloride: 97 mmol/L — ABNORMAL LOW (ref 98–107)
Creatinine: 1.28 mg/dL (ref 0.60–1.30)
EGFR (African American): >60
EGFR (Non-African Amer.): >60
Osmolality: 279 (ref 275–301)
SGOT(AST): 102 U/L — ABNORMAL HIGH (ref 15–37)
SGPT (ALT): 121 U/L — ABNORMAL HIGH
Sodium: 138 mmol/L (ref 136–145)
Total Protein: 8 g/dL (ref 6.4–8.2)

## 2011-06-07 LAB — LIPASE, BLOOD: Lipase: 71 U/L — ABNORMAL LOW (ref 73–393)

## 2011-06-07 LAB — CLOSTRIDIUM DIFFICILE BY PCR

## 2011-06-07 LAB — CBC
HGB: 16.1 g/dL (ref 13.0–18.0)
MCHC: 33.5 g/dL (ref 32.0–36.0)
Platelet: 235 10*3/uL (ref 150–440)
RBC: 4.99 10*6/uL (ref 4.40–5.90)
RDW: 13.2 % (ref 11.5–14.5)
WBC: 10 10*3/uL (ref 3.8–10.6)

## 2011-06-08 LAB — BASIC METABOLIC PANEL
Calcium, Total: 7.7 mg/dL — ABNORMAL LOW (ref 8.5–10.1)
Chloride: 104 mmol/L (ref 98–107)
Co2: 28 mmol/L (ref 21–32)
Creatinine: 1.08 mg/dL (ref 0.60–1.30)
EGFR (African American): >60
EGFR (Non-African Amer.): >60
Glucose: 125 mg/dL — ABNORMAL HIGH (ref 65–99)
Osmolality: 283 (ref 275–301)
Sodium: 141 mmol/L (ref 136–145)

## 2011-06-10 LAB — BASIC METABOLIC PANEL
Calcium, Total: 8.3 mg/dL — ABNORMAL LOW (ref 8.5–10.1)
Chloride: 103 mmol/L (ref 98–107)
Co2: 31 mmol/L (ref 21–32)
Creatinine: 1.11 mg/dL (ref 0.60–1.30)
EGFR (Non-African Amer.): >60
Potassium: 4.1 mmol/L (ref 3.5–5.1)
Sodium: 142 mmol/L (ref 136–145)

## 2011-06-10 LAB — CBC WITH DIFFERENTIAL/PLATELET
Basophil #: 0 10*3/uL (ref 0.0–0.1)
Eosinophil %: 6.8 %
HCT: 39.7 % — ABNORMAL LOW (ref 40.0–52.0)
HGB: 13.5 g/dL (ref 13.0–18.0)
Lymphocyte #: 0.8 10*3/uL — ABNORMAL LOW (ref 1.0–3.6)
Lymphocyte %: 23.8 %
MCH: 32.4 pg (ref 26.0–34.0)
MCV: 96 fL (ref 80–100)
Monocyte #: 0.5 10*3/uL (ref 0.0–0.7)
Neutrophil #: 2 10*3/uL (ref 1.4–6.5)
Platelet: 215 10*3/uL (ref 150–440)
RBC: 4.16 10*6/uL — ABNORMAL LOW (ref 4.40–5.90)
RDW: 13.4 % (ref 11.5–14.5)

## 2011-06-21 ENCOUNTER — Other Ambulatory Visit: Payer: Self-pay | Admitting: Internal Medicine

## 2011-06-23 ENCOUNTER — Other Ambulatory Visit: Payer: Self-pay | Admitting: Internal Medicine

## 2011-06-23 MED ORDER — ALPRAZOLAM 0.5 MG PO TABS: 0.5000 mg | ORAL_TABLET | Freq: Every evening | ORAL | Status: DC | PRN

## 2011-06-25 MED ORDER — HYDROCODONE-ACETAMINOPHEN 7.5-750 MG PO TABS: 1.0000 | ORAL_TABLET | Freq: Four times a day (QID) | ORAL | Status: DC | PRN

## 2011-07-20 ENCOUNTER — Other Ambulatory Visit: Payer: Self-pay | Admitting: *Deleted

## 2011-07-20 MED ORDER — METOPROLOL TARTRATE 25 MG PO TABS: 25.0000 mg | ORAL_TABLET | Freq: Two times a day (BID) | ORAL | Status: DC

## 2011-07-20 MED ORDER — LEVOTHYROXINE SODIUM 88 MCG PO TABS: 88.0000 ug | ORAL_TABLET | Freq: Every day | ORAL | Status: DC

## 2011-07-20 MED ORDER — LISINOPRIL-HYDROCHLOROTHIAZIDE 20-25 MG PO TABS: 1.0000 | ORAL_TABLET | Freq: Every day | ORAL | Status: DC

## 2011-09-25 ENCOUNTER — Other Ambulatory Visit: Payer: Self-pay | Admitting: Internal Medicine

## 2011-09-25 MED ORDER — ARIPIPRAZOLE 5 MG PO TABS: 5.0000 mg | ORAL_TABLET | Freq: Every day | ORAL | Status: DC

## 2011-09-25 MED ORDER — BUPROPION HCL ER (SR) 150 MG PO TB12: 150.0000 mg | ORAL_TABLET | Freq: Two times a day (BID) | ORAL | Status: DC

## 2011-10-17 ENCOUNTER — Ambulatory Visit (INDEPENDENT_AMBULATORY_CARE_PROVIDER_SITE_OTHER): Payer: BC Managed Care – PPO | Admitting: Internal Medicine

## 2011-10-17 ENCOUNTER — Encounter: Payer: Self-pay | Admitting: Internal Medicine

## 2011-10-17 VITALS — BP 112/68 | HR 66 | Temp 98.4°F | Resp 16 | Ht 72.0 in | Wt 237.0 lb

## 2011-10-17 DIAGNOSIS — R7401 Elevation of levels of liver transaminase levels: Secondary | ICD-10-CM

## 2011-10-17 DIAGNOSIS — R748 Abnormal levels of other serum enzymes: Secondary | ICD-10-CM

## 2011-10-17 DIAGNOSIS — E785 Hyperlipidemia, unspecified: Secondary | ICD-10-CM

## 2011-10-17 DIAGNOSIS — Z8042 Family history of malignant neoplasm of prostate: Secondary | ICD-10-CM

## 2011-10-17 DIAGNOSIS — K219 Gastro-esophageal reflux disease without esophagitis: Secondary | ICD-10-CM

## 2011-10-17 DIAGNOSIS — Z9889 Other specified postprocedural states: Secondary | ICD-10-CM

## 2011-10-17 DIAGNOSIS — R5383 Other fatigue: Secondary | ICD-10-CM

## 2011-10-17 DIAGNOSIS — M2550 Pain in unspecified joint: Secondary | ICD-10-CM

## 2011-10-17 DIAGNOSIS — R5381 Other malaise: Secondary | ICD-10-CM

## 2011-10-17 DIAGNOSIS — E039 Hypothyroidism, unspecified: Secondary | ICD-10-CM

## 2011-10-17 DIAGNOSIS — F411 Generalized anxiety disorder: Secondary | ICD-10-CM

## 2011-10-17 DIAGNOSIS — E559 Vitamin D deficiency, unspecified: Secondary | ICD-10-CM

## 2011-10-17 DIAGNOSIS — G4733 Obstructive sleep apnea (adult) (pediatric): Secondary | ICD-10-CM

## 2011-10-17 DIAGNOSIS — I10 Essential (primary) hypertension: Secondary | ICD-10-CM

## 2011-10-17 LAB — LIPID PANEL
HDL: 37.2 mg/dL — ABNORMAL LOW (ref >39.00)
VLDL: 62.4 mg/dL — ABNORMAL HIGH (ref 0.0–40.0)

## 2011-10-17 LAB — PSA: PSA: 0.75 ng/mL (ref 0.10–4.00)

## 2011-10-17 LAB — TSH: TSH: 1.48 u[IU]/mL (ref 0.35–5.50)

## 2011-10-17 MED ORDER — TRAMADOL HCL 50 MG PO TABS: 50.0000 mg | ORAL_TABLET | Freq: Four times a day (QID) | ORAL | Status: DC | PRN

## 2011-10-17 MED ORDER — LISINOPRIL-HYDROCHLOROTHIAZIDE 20-25 MG PO TABS: 1.0000 | ORAL_TABLET | Freq: Every day | ORAL | Status: DC

## 2011-10-17 MED ORDER — METHOCARBAMOL 500 MG PO TABS: 500.0000 mg | ORAL_TABLET | Freq: Three times a day (TID) | ORAL | Status: DC | PRN

## 2011-10-17 MED ORDER — HYDROCODONE-ACETAMINOPHEN 7.5-750 MG PO TABS: 1.0000 | ORAL_TABLET | Freq: Four times a day (QID) | ORAL | Status: DC | PRN

## 2011-10-17 MED ORDER — CITALOPRAM HYDROBROMIDE 20 MG PO TABS: 20.0000 mg | ORAL_TABLET | Freq: Every day | ORAL | Status: DC

## 2011-10-17 MED ORDER — METOPROLOL TARTRATE 25 MG PO TABS: 25.0000 mg | ORAL_TABLET | Freq: Two times a day (BID) | ORAL | Status: DC

## 2011-10-17 MED ORDER — OMEPRAZOLE 20 MG PO CPDR: 20.0000 mg | DELAYED_RELEASE_CAPSULE | Freq: Every day | ORAL | Status: DC

## 2011-10-17 NOTE — Progress Notes (Signed)
Patient ID: [name], male   DOB: 1966-03-22, 47 y.o.   MRN: [id number] Patient Active Problem List  Diagnosis  . GOITER  . HYPERLIPIDEMIA  . ANXIETY DEPRESSION  . ESSENTIAL HYPERTENSION, BENIGN  . HYPERTENSION  . OSA (obstructive sleep apnea)  . PRECORDIAL PAIN  . Personal history of unspecified circulatory disease  . APPENDECTOMY, HX OF  . ARTHROSCOPY, KNEE, HX OF  . Sleep related leg cramps  . Fatigue  . Polyarthralgia  . Sciatica of right side  . Sciatica    Subjective:  CC:   Chief Complaint  Patient presents with  . Annual Exam    HPI:   [name] a 46 y.o. male who presents    The following portions of the patient's history were reviewed and updated as appropriate: Allergies, current medications, and problem list.  Past Medical History  Diagnosis Date  . Hypertension     essential, benign  . Anxiety and depression   . Chest pain     hx  . Sleep apnea   . HLD (hyperlipidemia)   . Palpitation     hx  . Other chest pain     tightness, pressure  . Precordial pain   . Murmur   . Bruit     L  . Impotence of organic origin   . Edema   . Goiter     Past Surgical History  Procedure Date  . Knee arthroscopy   . Appendectomy 2002    done at [facility]  . Total knee arthroplasty     right    Current Outpatient Prescriptions on File Prior to Visit  Medication Sig Dispense Refill  . ALPRAZolam (XANAX) 0.5 MG tablet Take 1 tablet (0.5 mg total) by mouth at bedtime as needed.  30 tablet  3  . ARIPiprazole (ABILIFY) 5 MG tablet Take 1 tablet (5 mg total) by mouth daily.  30 tablet  5  . aspirin 81 MG EC tablet Take 81 mg by mouth daily.        Marland Kitchen buPROPion (WELLBUTRIN SR) 150 MG 12 hr tablet Take 1 tablet (150 mg total) by mouth 2 (two) times daily.  60 tablet  5  . citalopram (CELEXA) 20 MG tablet Take 1 tablet (20 mg total) by mouth daily.  30 tablet  6  . Cyanocobalamin 2500 MCG TABS Take 1 tablet by mouth every other day.        .  levothyroxine (SYNTHROID) 88 MCG tablet Take 1 tablet (88 mcg total) by mouth daily.  30 tablet  3  . lisinopril-hydrochlorothiazide (PRINZIDE,ZESTORETIC) 20-25 MG per tablet Take 1 tablet by mouth daily.  30 tablet  3  . metoprolol tartrate (LOPRESSOR) 25 MG tablet Take 1 tablet (25 mg total) by mouth 2 (two) times daily.  60 tablet  3  . Multiple Vitamin (MULTIVITAMIN) tablet Take 1 tablet by mouth daily.        Marland Kitchen omeprazole (PRILOSEC) 20 MG capsule Take 1 capsule (20 mg total) by mouth daily.  30 capsule  6  . vitamin C (ASCORBIC ACID) 500 MG tablet Take 500 mg by mouth daily.           Review of Systems:  12 point review of systems is negative except what is addressed in the HPI,       Objective:  BP 112/68  Pulse 66  Temp 98.4 F (36.9 C) (Oral)  Resp 16  Ht 6' (1.829 m)  Wt 237 lb (107.502  kg)  BMI 32.14 kg/m2  SpO2 93%  General:   BP 112/68  Pulse 66  Temp 98.4 F (36.9 C) (Oral)  Resp 16  Ht 6' (1.829 m)  Wt 237 lb (107.502 kg)  BMI 32.14 kg/m2  SpO2 93%  General Appearance:    Obese, alert, cooperative, no distress, appears stated age,  Head:    Normocephalic, without obvious abnormality, atraumatic  Eyes:    PERRL, conjunctiva/corneas clear, EOM's intact, both eyes       Ears:    Normal TM's and external ear canals, both ears  Nose:   Nares normal, septum midline, mucosa normal, no drainage   or sinus tenderness  Throat:   Lips, mucosa, and tongue normal; teeth and gums normal  Neck:   Supple, symmetrical, trachea midline, no adenopathy;       thyroid:  No enlargement/tenderness/nodules; no carotid   bruit or JVD  Back:     Symmetric, no curvature, ROM normal, no CVA tenderness  Lungs:     Clear to auscultation bilaterally, respirations unlabored  Chest wall:    No tenderness or deformity  Heart:    Regular rate and rhythm, S1 and S2 normal, no murmur, rub   or gallop  Abdomen:     Soft, non-tender, bowel sounds active all four quadrants,    no  masses, no organomegaly  Extremities:   Extremities normal, atraumatic, no cyanosis or edema  Pulses:   2+ and symmetric all extremities  Skin:   Skin color, texture, turgor normal, no rashes or lesions  Lymph nodes:   Cervical, supraclavicular, and axillary nodes normal  Urologic:                                              Normal testicles, penis, no inguinal hernias  Neurologic:   CNII-XII intact. Normal strength, sensation and reflexes      Throughout    Assessment and Plan:   HYPERTENSION Well controlled on current regimen. Renal function stable, no changes today.  OSA (obstructive sleep apnea) Compliant with CPAP use per patient report  ARTHROSCOPY, KNEE, HX OF He continues to have bilateral knee pain  aggravated by his weight.  Recommended weight loss, regular exercise.,  Vicodin refill given.   Polyarthralgia He continues to have bilateral knee pain  aggravated by his weight.  Recommended weight loss, regular exercise.,  Vicodin refill given.    HYPERLIPIDEMIA Repeat lipids due   Updated Medication List Outpatient Encounter Prescriptions as of 10/17/2011  Medication Sig Dispense Refill  . ALPRAZolam (XANAX) 0.5 MG tablet Take 1 tablet (0.5 mg total) by mouth at bedtime as needed.  30 tablet  3  . ARIPiprazole (ABILIFY) 5 MG tablet Take 1 tablet (5 mg total) by mouth daily.  30 tablet  5  . aspirin 81 MG EC tablet Take 81 mg by mouth daily.        Marland Kitchen buPROPion (WELLBUTRIN SR) 150 MG 12 hr tablet Take 1 tablet (150 mg total) by mouth 2 (two) times daily.  60 tablet  5  . citalopram (CELEXA) 20 MG tablet Take 1 tablet (20 mg total) by mouth daily.  30 tablet  6  . Cyanocobalamin 2500 MCG TABS Take 1 tablet by mouth every other day.        Marland Kitchen HYDROcodone-acetaminophen (VICODIN ES) 7.5-750 MG per tablet Take 1 tablet by  mouth every 6 (six) hours as needed for pain.  120 tablet  1  . levothyroxine (SYNTHROID) 88 MCG tablet Take 1 tablet (88 mcg total) by mouth daily.  30 tablet   3  . lisinopril-hydrochlorothiazide (PRINZIDE,ZESTORETIC) 20-25 MG per tablet Take 1 tablet by mouth daily.  30 tablet  3  . methocarbamol (ROBAXIN) 500 MG tablet Take 1 tablet (500 mg total) by mouth every 8 (eight) hours as needed.  90 tablet  3  . metoprolol tartrate (LOPRESSOR) 25 MG tablet Take 1 tablet (25 mg total) by mouth 2 (two) times daily.  60 tablet  3  . Multiple Vitamin (MULTIVITAMIN) tablet Take 1 tablet by mouth daily.        Marland Kitchen omeprazole (PRILOSEC) 20 MG capsule Take 1 capsule (20 mg total) by mouth daily.  30 capsule  6  . traMADol (ULTRAM) 50 MG tablet Take 1 tablet (50 mg total) by mouth every 6 (six) hours as needed.  60 tablet  3  . vitamin C (ASCORBIC ACID) 500 MG tablet Take 500 mg by mouth daily.        Marland Kitchen DISCONTD: citalopram (CELEXA) 20 MG tablet Take 1 tablet (20 mg total) by mouth daily.  30 tablet  6  . DISCONTD: HYDROcodone-acetaminophen (VICODIN ES) 7.5-750 MG per tablet Take 1 tablet by mouth every 6 (six) hours as needed for pain.  120 tablet  1  . DISCONTD: lisinopril-hydrochlorothiazide (PRINZIDE,ZESTORETIC) 20-25 MG per tablet Take 1 tablet by mouth daily.  30 tablet  3  . DISCONTD: methocarbamol (ROBAXIN) 500 MG tablet Take 500 mg by mouth every 8 (eight) hours as needed.        Marland Kitchen DISCONTD: metoprolol tartrate (LOPRESSOR) 25 MG tablet Take 1 tablet (25 mg total) by mouth 2 (two) times daily.  60 tablet  3  . DISCONTD: omeprazole (PRILOSEC) 20 MG capsule Take 20 mg by mouth daily.        Marland Kitchen DISCONTD: traMADol (ULTRAM) 50 MG tablet Take 50 mg by mouth every 6 (six) hours as needed.        Marland Kitchen DISCONTD: lisinopril-hydrochlorothiazide (PRINZIDE,ZESTORETIC) 20-25 MG per tablet Take 1 tablet by mouth daily.  30 tablet  3  . DISCONTD: metoprolol tartrate (LOPRESSOR) 25 MG tablet Take 1 tablet (25 mg total) by mouth 2 (two) times daily.  60 tablet  3

## 2011-10-17 NOTE — Patient Instructions (Addendum)
Consider the Low Glycemic Index Diet and 6 smaller meals daily .  This boosts your metabolism and regulates your sugars:   7 AM Low carbohydrate Protein  Shakes (EAS Carb Control  Or Atkins ,  Available everywhere,   In  cases at [employer] )  2.5 carbs  (Add or substitute a toasted sandwhich thin w/ peanut butter)  10 AM: Protein bar by Atkins (snack size,  Chocolate lover's variety at  [employer])    Lunch: sandwich on pita bread or flatbread (Joseph's makes a pita bread and a flat bread , available at [employer] and [employer]; Toufayah makes a low carb flatbread available at [employer] and [employer]) Mission makes a low carb whole wheat tortilla available at [employer],and most grocery stores   3 PM:  Mid day :  Another protein bar,  Or a  cheese stick, 1/4 cup of almonds, walnuts, pistachios, pecans, peanuts,  Macadamia nuts  6 PM  Dinner:  "mean and green:"  Meat/chicken/fish, salad, and green veggie : use ranch, vinagrette,  Blue cheese, etc  9 PM snack : Breyer's low carb fudgsicle or  ice cream bar (Carb Smart), or  Weight Watcher's ice cream bar , or another protein shake  

## 2011-10-18 ENCOUNTER — Telehealth: Payer: Self-pay | Admitting: Internal Medicine

## 2011-10-18 ENCOUNTER — Encounter: Payer: Self-pay | Admitting: Internal Medicine

## 2011-10-18 LAB — COMPLETE METABOLIC PANEL WITH GFR
AST: 62 U/L — ABNORMAL HIGH (ref 0–37)
Albumin: 4.4 g/dL (ref 3.5–5.2)
Alkaline Phosphatase: 73 U/L (ref 39–117)
BUN: 18 mg/dL (ref 6–23)
Calcium: 9.7 mg/dL (ref 8.4–10.5)
Chloride: 96 mEq/L (ref 96–112)
Glucose, Bld: 89 mg/dL (ref 70–99)
Potassium: 4.5 mEq/L (ref 3.5–5.3)
Sodium: 137 mEq/L (ref 135–145)
Total Protein: 6.9 g/dL (ref 6.0–8.3)

## 2011-10-18 NOTE — Addendum Note (Signed)
Addended by: Duncan Dull on: 10/18/2011 05:39 PM   Modules accepted: Orders

## 2011-10-18 NOTE — Assessment & Plan Note (Signed)
Well controlled on current regimen. Renal function stable, no changes today. 

## 2011-10-18 NOTE — Assessment & Plan Note (Signed)
He continues to have bilateral knee pain  aggravated by his weight.  Recommended weight loss, regular exercise.,  Vicodin refill given.  

## 2011-10-18 NOTE — Telephone Encounter (Signed)
Pt called checking on lab results   Please put results on my chart

## 2011-10-18 NOTE — Assessment & Plan Note (Signed)
He continues to have bilateral knee pain  aggravated by his weight.  Recommended weight loss, regular exercise.,  Vicodin refill given.

## 2011-10-18 NOTE — Telephone Encounter (Signed)
Patient is requesting labs to be released to mychart.

## 2011-10-18 NOTE — Assessment & Plan Note (Signed)
Repeat lipids due.  

## 2011-10-18 NOTE — Assessment & Plan Note (Signed)
Compliant with CPAP use per patient report

## 2011-10-19 ENCOUNTER — Telehealth: Payer: Self-pay | Admitting: Internal Medicine

## 2011-10-19 NOTE — Telephone Encounter (Signed)
Patient notified

## 2011-10-19 NOTE — Telephone Encounter (Signed)
Patient sent this message through MyChart:  I would rather my ultrasound be conducted at [facility]. Thank You [name]

## 2011-10-19 NOTE — Telephone Encounter (Signed)
I anticipated that.  Order is in [employer]

## 2011-10-20 ENCOUNTER — Other Ambulatory Visit (INDEPENDENT_AMBULATORY_CARE_PROVIDER_SITE_OTHER): Payer: BC Managed Care – PPO | Admitting: *Deleted

## 2011-10-20 ENCOUNTER — Telehealth: Payer: Self-pay | Admitting: Internal Medicine

## 2011-10-20 DIAGNOSIS — R748 Abnormal levels of other serum enzymes: Secondary | ICD-10-CM

## 2011-10-20 DIAGNOSIS — R7401 Elevation of levels of liver transaminase levels: Secondary | ICD-10-CM

## 2011-10-20 NOTE — Telephone Encounter (Signed)
Added to lab schedule today

## 2011-10-21 LAB — HEPATITIS B SURFACE ANTIGEN: Hepatitis B Surface Ag: NEGATIVE

## 2011-10-21 LAB — IRON AND TIBC: Iron: 150 ug/dL (ref 42–165)

## 2011-10-24 ENCOUNTER — Telehealth: Payer: Self-pay | Admitting: Internal Medicine

## 2011-10-24 ENCOUNTER — Ambulatory Visit (HOSPITAL_COMMUNITY)
Admission: RE | Admit: 2011-10-24 | Discharge: 2011-10-24 | Disposition: A | Discharge status: Home / Self Care | Payer: BC Managed Care – PPO | Source: Ambulatory Visit | Attending: Internal Medicine | Admitting: Internal Medicine

## 2011-10-24 DIAGNOSIS — R7401 Elevation of levels of liver transaminase levels: Secondary | ICD-10-CM | POA: Insufficient documentation

## 2011-10-24 DIAGNOSIS — R748 Abnormal levels of other serum enzymes: Secondary | ICD-10-CM

## 2011-10-24 DIAGNOSIS — E7800 Pure hypercholesterolemia, unspecified: Secondary | ICD-10-CM | POA: Insufficient documentation

## 2011-10-24 DIAGNOSIS — K7689 Other specified diseases of liver: Secondary | ICD-10-CM | POA: Insufficient documentation

## 2011-10-24 DIAGNOSIS — R7402 Elevation of levels of lactic acid dehydrogenase (LDH): Secondary | ICD-10-CM | POA: Insufficient documentation

## 2011-10-24 LAB — PROTEIN ELECTROPHORESIS, SERUM
Albumin ELP: 63.7 % (ref 55.8–66.1)
Beta 2: 3.5 % (ref 3.2–6.5)

## 2011-10-24 NOTE — Telephone Encounter (Signed)
Please tell him I am aware that he wants them but that I will post them when they are complete.  They are not complete yet.

## 2011-10-24 NOTE — Telephone Encounter (Signed)
Patient is wanting his lab results from last week to be posted to my chart.

## 2011-10-24 NOTE — Telephone Encounter (Signed)
Patient notified

## 2011-10-30 ENCOUNTER — Telehealth: Payer: Self-pay | Admitting: Internal Medicine

## 2011-10-30 NOTE — Telephone Encounter (Signed)
His abdominal ultrasound was normal except for signs of fatty liver.

## 2011-11-01 ENCOUNTER — Other Ambulatory Visit: Payer: Self-pay | Admitting: *Deleted

## 2011-11-01 MED ORDER — ALPRAZOLAM 0.5 MG PO TABS: 0.5000 mg | ORAL_TABLET | Freq: Every evening | ORAL | Status: DC | PRN

## 2011-11-02 NOTE — Telephone Encounter (Signed)
Rx called to [facility]. 

## 2011-11-26 ENCOUNTER — Telehealth: Payer: Self-pay | Admitting: Internal Medicine

## 2011-11-26 DIAGNOSIS — I10 Essential (primary) hypertension: Secondary | ICD-10-CM

## 2011-11-26 MED ORDER — LISINOPRIL-HYDROCHLOROTHIAZIDE 20-25 MG PO TABS: 1.0000 | ORAL_TABLET | Freq: Every day | ORAL | Status: DC

## 2011-11-26 NOTE — Addendum Note (Signed)
Addended by: Duncan Dull on: 11/26/2011 10:28 PM   Modules accepted: Orders

## 2011-11-27 ENCOUNTER — Other Ambulatory Visit: Payer: Self-pay | Admitting: *Deleted

## 2011-11-27 MED ORDER — LEVOTHYROXINE SODIUM 88 MCG PO TABS: 88.0000 ug | ORAL_TABLET | Freq: Every day | ORAL | Status: DC

## 2011-11-30 ENCOUNTER — Other Ambulatory Visit: Payer: Self-pay | Admitting: Internal Medicine

## 2011-11-30 DIAGNOSIS — M2550 Pain in unspecified joint: Secondary | ICD-10-CM

## 2011-12-01 MED ORDER — HYDROCODONE-ACETAMINOPHEN 7.5-750 MG PO TABS: 1.0000 | ORAL_TABLET | Freq: Four times a day (QID) | ORAL | Status: DC | PRN

## 2011-12-04 ENCOUNTER — Other Ambulatory Visit: Payer: Self-pay | Admitting: *Deleted

## 2011-12-04 MED ORDER — ARIPIPRAZOLE 5 MG PO TABS: 5.0000 mg | ORAL_TABLET | Freq: Every day | ORAL | Status: DC

## 2011-12-13 ENCOUNTER — Telehealth: Payer: Self-pay | Admitting: Internal Medicine

## 2011-12-13 NOTE — Telephone Encounter (Signed)
Dr. Darrick Huntsman when would you like for patient to come back for follow up, and do you want him to have labs done prior to follow up. If so what labs would you like ordered.

## 2011-12-13 NOTE — Telephone Encounter (Signed)
I sent him an e mail on July 3 with rec's to return in 6 monhts ,  CMEt and fasting lipids prior to visit.

## 2011-12-13 NOTE — Telephone Encounter (Signed)
Pt wanted to know when he needed to come back for follow up appointment and labs

## 2011-12-14 NOTE — Telephone Encounter (Signed)
Patient notified. He will schedule appt .

## 2011-12-25 ENCOUNTER — Other Ambulatory Visit: Payer: Self-pay | Admitting: Internal Medicine

## 2011-12-26 ENCOUNTER — Other Ambulatory Visit: Payer: Self-pay | Admitting: *Deleted

## 2011-12-26 DIAGNOSIS — F411 Generalized anxiety disorder: Secondary | ICD-10-CM

## 2011-12-27 ENCOUNTER — Other Ambulatory Visit: Payer: Self-pay | Admitting: *Deleted

## 2011-12-27 DIAGNOSIS — F411 Generalized anxiety disorder: Secondary | ICD-10-CM

## 2011-12-27 MED ORDER — CITALOPRAM HYDROBROMIDE 20 MG PO TABS: 20.0000 mg | ORAL_TABLET | Freq: Every day | ORAL | Status: DC

## 2012-01-12 ENCOUNTER — Other Ambulatory Visit: Payer: Self-pay | Admitting: Internal Medicine

## 2012-01-12 DIAGNOSIS — M2550 Pain in unspecified joint: Secondary | ICD-10-CM

## 2012-01-12 MED ORDER — HYDROCODONE-ACETAMINOPHEN 7.5-750 MG PO TABS: 1.0000 | ORAL_TABLET | Freq: Four times a day (QID) | ORAL | Status: DC | PRN

## 2012-02-11 ENCOUNTER — Other Ambulatory Visit: Payer: Self-pay | Admitting: Internal Medicine

## 2012-02-20 ENCOUNTER — Other Ambulatory Visit: Payer: Self-pay | Admitting: Internal Medicine

## 2012-02-20 ENCOUNTER — Telehealth: Payer: Self-pay | Admitting: Internal Medicine

## 2012-02-20 DIAGNOSIS — M2550 Pain in unspecified joint: Secondary | ICD-10-CM

## 2012-02-20 MED ORDER — HYDROCODONE-ACETAMINOPHEN 7.5-750 MG PO TABS: 1.0000 | ORAL_TABLET | Freq: Three times a day (TID) | ORAL | Status: DC | PRN

## 2012-02-20 NOTE — Telephone Encounter (Signed)
maximum 3 tablets daily  On the hydrocodone or #90/month

## 2012-02-20 NOTE — Telephone Encounter (Signed)
Request for controlled substance Hydrocodon-acetaminoph 7.5-750 Sig: take 1 tablet every 6 hours as needed for pain

## 2012-02-21 ENCOUNTER — Other Ambulatory Visit: Payer: Self-pay

## 2012-02-21 ENCOUNTER — Telehealth: Payer: Self-pay

## 2012-02-21 NOTE — Telephone Encounter (Signed)
Rx faxed to [facility] @ [phone] 

## 2012-02-21 NOTE — Telephone Encounter (Signed)
Refilled ysterday.

## 2012-02-21 NOTE — Telephone Encounter (Signed)
Refill request for controlled substance Hydrocodon- acetaminoph 7.5 -750 Sig: take 1 tablet every 6 hours as needed for pain

## 2012-02-21 NOTE — Telephone Encounter (Signed)
Hydrocodone Acetaminophen 7.5-750 mg faxed to [facility] [phone]

## 2012-02-22 ENCOUNTER — Other Ambulatory Visit: Payer: Self-pay

## 2012-02-23 ENCOUNTER — Other Ambulatory Visit: Payer: Self-pay | Admitting: Internal Medicine

## 2012-02-23 MED ORDER — ALPRAZOLAM 0.5 MG PO TABS: 0.5000 mg | ORAL_TABLET | Freq: Every evening | ORAL | Status: DC | PRN

## 2012-03-10 ENCOUNTER — Other Ambulatory Visit: Payer: Self-pay | Admitting: Internal Medicine

## 2012-03-11 NOTE — Telephone Encounter (Signed)
Hey Dr. Darrick Huntsman,  I am not sure if protocol allows me to order this. Please advise if you want me to refill

## 2012-03-12 ENCOUNTER — Other Ambulatory Visit: Payer: Self-pay

## 2012-03-12 DIAGNOSIS — E291 Testicular hypofunction: Secondary | ICD-10-CM | POA: Insufficient documentation

## 2012-03-22 ENCOUNTER — Other Ambulatory Visit: Payer: Self-pay

## 2012-03-22 ENCOUNTER — Other Ambulatory Visit: Payer: Self-pay | Admitting: Internal Medicine

## 2012-03-22 DIAGNOSIS — I10 Essential (primary) hypertension: Secondary | ICD-10-CM

## 2012-03-22 MED ORDER — LISINOPRIL-HYDROCHLOROTHIAZIDE 20-25 MG PO TABS: 1.0000 | ORAL_TABLET | Freq: Every day | ORAL | Status: DC

## 2012-03-22 NOTE — Telephone Encounter (Signed)
Lisinopril- Hydrochlorothiazide 20-25 mg # 30 3 R sent electronic to [facility]

## 2012-03-29 ENCOUNTER — Ambulatory Visit (INDEPENDENT_AMBULATORY_CARE_PROVIDER_SITE_OTHER): Payer: BC Managed Care – PPO | Admitting: Pulmonary Disease

## 2012-03-29 ENCOUNTER — Encounter: Payer: Self-pay | Admitting: *Deleted

## 2012-03-29 ENCOUNTER — Encounter: Payer: Self-pay | Admitting: Pulmonary Disease

## 2012-03-29 VITALS — BP 138/82 | HR 75 | Temp 98.0°F | Ht 72.0 in | Wt 236.8 lb

## 2012-03-29 DIAGNOSIS — G4733 Obstructive sleep apnea (adult) (pediatric): Secondary | ICD-10-CM

## 2012-03-29 NOTE — Patient Instructions (Signed)
Will arrange for new CPAP machine and mask refit Will get report from new CPAP machine and call with results Follow up in 1 year

## 2012-03-29 NOTE — Assessment & Plan Note (Signed)
He needs a new machine.  Will have this provided through his DME.  Will have them check his mask fit.  Will get download from his new machine, and then determine if he needs adjustment to pressure settings.

## 2012-03-29 NOTE — Progress Notes (Signed)
Chief Complaint  Patient presents with  . Follow-up    Pt reports breathing is ok, wearing CPAP approx 9 hrs /day-- reports feeling like hes "not getting enough air in mask at times"    History of Present Illness: [name] is a 46 y.o. male with OSA.  He uses his machine every night.  He does not feel like he gets enough pressure from his machine at times.  His mouth has been getting dryer.  He is not sure if he is snoring again.   Tests: PSG 01/21/07>>AHI 18, SpO2 low 56%. CPAP 7 cm H2O>>AHI 2.6 Auto CPAP 12/19/10 to 12/29/10>>Used on 9 of 10 nights with average 6 hrs 41 min. Optimal pressure 14 cm H2O with average AHI 2.7.  Past Medical History  Diagnosis Date  . Hypertension     essential, benign  . Anxiety and depression   . Chest pain     hx  . Sleep apnea   . HLD (hyperlipidemia)   . Palpitation     hx  . Other chest pain     tightness, pressure  . Precordial pain   . Murmur   . Bruit     L  . Impotence of organic origin   . Edema   . Goiter     Past Surgical History  Procedure Date  . Knee arthroscopy   . Appendectomy 2002    done at [facility]  . Total knee arthroplasty     right    Current Outpatient Prescriptions on File Prior to Visit  Medication Sig Dispense Refill  . ALPRAZolam (XANAX) 0.5 MG tablet Take 1 tablet (0.5 mg total) by mouth at bedtime as needed.  30 tablet  3  . ARIPiprazole (ABILIFY) 5 MG tablet Take 1 tablet (5 mg total) by mouth daily.  30 tablet  5  . aspirin 81 MG EC tablet Take 81 mg by mouth daily.        Marland Kitchen buPROPion (WELLBUTRIN SR) 150 MG 12 hr tablet Take 1 tablet (150 mg total) by mouth 2 (two) times daily.  60 tablet  5  . citalopram (CELEXA) 20 MG tablet Take 1 tablet (20 mg total) by mouth daily.  30 tablet  6  . HYDROcodone-acetaminophen (VICODIN ES) 7.5-750 MG per tablet Take 1 tablet by mouth every 8 (eight) hours as needed for pain.  90 tablet  2  . levothyroxine (SYNTHROID, LEVOTHROID) 88 MCG tablet Take 1 tablet  (88 mcg total) by mouth daily.  30 tablet  3  . lisinopril-hydrochlorothiazide (PRINZIDE,ZESTORETIC) 20-25 MG per tablet Take 1 tablet by mouth daily.  90 tablet  3  . methocarbamol (ROBAXIN) 500 MG tablet TAKE 1 TABLET BY MOUTH EVERY 8 HOURS AS NEEDED  90 tablet  3  . Multiple Vitamin (MULTIVITAMIN) tablet Take 1 tablet by mouth daily.        Marland Kitchen omeprazole (PRILOSEC) 20 MG capsule Take 1 capsule (20 mg total) by mouth daily.  30 capsule  6  . traMADol (ULTRAM) 50 MG tablet Take 1 tablet (50 mg total) by mouth every 6 (six) hours as needed.  60 tablet  3  . vitamin C (ASCORBIC ACID) 500 MG tablet Take 500 mg by mouth daily.        . Cyanocobalamin 2500 MCG TABS Take 1 tablet by mouth every other day.        . metoprolol tartrate (LOPRESSOR) 25 MG tablet Take 1 tablet (25 mg total) by mouth 2 (two) times  daily.  60 tablet  3  . [DISCONTINUED] lisinopril-hydrochlorothiazide (PRINZIDE,ZESTORETIC) 20-25 MG per tablet TAKE 1 TABLET BY MOUTH DAILY.  30 tablet  3  . [DISCONTINUED] lisinopril-hydrochlorothiazide (PRINZIDE,ZESTORETIC) 20-25 MG per tablet Take 1 tablet by mouth daily.  30 tablet  3  . [DISCONTINUED] metoprolol tartrate (LOPRESSOR) 25 MG tablet TAKE 1 TABLET (25 MG TOTAL) BY MOUTH 2 (TWO) TIMES DAILY.  60 tablet  3    No Known Allergies   Physical Exam: Filed Vitals:   03/29/12 1520  BP: 138/82  Pulse: 75  Temp: 98 F (36.7 C)  TempSrc: Oral  Height: 6' (1.829 m)  Weight: 236 lb 12.8 oz (107.412 kg)  SpO2: 96%    Current Encounter SPO2  03/29/12 1520 96%  10/17/11 1322 93%  12/23/10 0931 96%    Wt Readings from Last 3 Encounters:  03/29/12 236 lb 12.8 oz (107.412 kg)  10/17/11 237 lb (107.502 kg)  12/23/10 224 lb 8 oz (101.833 kg)    Body mass index is 32.12 kg/(m^2).   General - No distress ENT - No sinus tenderness, no oral exudate, no LAN Cardiac - s1s2 regular, no murmur Chest - No wheeze/rales/dullness, good air entry, normal respiratory excursion Back - No  focal tenderness Abd - Soft, non-tender Ext - No edema Neuro - Normal strength Skin - No rashes Psych - Normal mood, and behavior   Assessment/Plan:  Coralyn Helling, MD [facility] Pager:  [phone] 03/29/2012, 3:30 PM

## 2012-04-11 ENCOUNTER — Telehealth: Payer: Self-pay | Admitting: Internal Medicine

## 2012-04-11 DIAGNOSIS — E039 Hypothyroidism, unspecified: Secondary | ICD-10-CM

## 2012-04-11 DIAGNOSIS — R7989 Other specified abnormal findings of blood chemistry: Secondary | ICD-10-CM

## 2012-04-11 DIAGNOSIS — E785 Hyperlipidemia, unspecified: Secondary | ICD-10-CM

## 2012-04-11 NOTE — Telephone Encounter (Signed)
Nothing is required from [place], just please see request for blood work.

## 2012-04-11 NOTE — Telephone Encounter (Signed)
How is he having a physical on the 26th?  I am not in the office that day.  I will order the labs but please clarify his appt date

## 2012-04-11 NOTE — Telephone Encounter (Signed)
Caller: Karen/Patient; Phone: [phone]; Reason for Call: Patient is scheduled for Physical on December 26th.  He wants to make sure on his blood work panel that Thyroid and Liver Enzymes are tested.  Advised I would forward request to the physician.  Understanding expressed.

## 2012-04-12 NOTE — Telephone Encounter (Signed)
He has a 6 month follow up with you on 04/22/12.

## 2012-04-18 ENCOUNTER — Other Ambulatory Visit (INDEPENDENT_AMBULATORY_CARE_PROVIDER_SITE_OTHER): Payer: BC Managed Care – PPO

## 2012-04-18 DIAGNOSIS — E039 Hypothyroidism, unspecified: Secondary | ICD-10-CM

## 2012-04-18 DIAGNOSIS — E785 Hyperlipidemia, unspecified: Secondary | ICD-10-CM

## 2012-04-18 DIAGNOSIS — R7989 Other specified abnormal findings of blood chemistry: Secondary | ICD-10-CM

## 2012-04-18 LAB — COMPREHENSIVE METABOLIC PANEL
ALT: 98 U/L — ABNORMAL HIGH (ref 0–53)
AST: 80 U/L — ABNORMAL HIGH (ref 0–37)
Alkaline Phosphatase: 86 U/L (ref 39–117)
BUN: 12 mg/dL (ref 6–23)
Creatinine, Ser: 1 mg/dL (ref 0.4–1.5)
Total Bilirubin: 0.8 mg/dL (ref 0.3–1.2)

## 2012-04-18 LAB — LIPID PANEL
HDL: 30.9 mg/dL — ABNORMAL LOW (ref >39.00)
Total CHOL/HDL Ratio: 7
Triglycerides: 373 mg/dL — ABNORMAL HIGH (ref 0.0–149.0)
VLDL: 74.6 mg/dL — ABNORMAL HIGH (ref 0.0–40.0)

## 2012-04-18 LAB — TSH: TSH: 2.18 u[IU]/mL (ref 0.35–5.50)

## 2012-04-18 LAB — LDL CHOLESTEROL, DIRECT: Direct LDL: 153.6 mg/dL

## 2012-04-18 NOTE — Addendum Note (Signed)
Addended by: [name] on: 04/18/2012 07:34 PM   Modules accepted: Orders

## 2012-04-19 ENCOUNTER — Other Ambulatory Visit: Payer: BC Managed Care – PPO

## 2012-04-19 DIAGNOSIS — R7989 Other specified abnormal findings of blood chemistry: Secondary | ICD-10-CM

## 2012-04-22 ENCOUNTER — Encounter: Payer: Self-pay | Admitting: Internal Medicine

## 2012-04-22 ENCOUNTER — Ambulatory Visit (INDEPENDENT_AMBULATORY_CARE_PROVIDER_SITE_OTHER): Payer: BC Managed Care – PPO | Admitting: Internal Medicine

## 2012-04-22 VITALS — BP 130/82 | HR 68 | Temp 98.7°F | Resp 16 | Wt 234.0 lb

## 2012-04-22 DIAGNOSIS — K760 Fatty (change of) liver, not elsewhere classified: Secondary | ICD-10-CM | POA: Insufficient documentation

## 2012-04-22 DIAGNOSIS — R7401 Elevation of levels of liver transaminase levels: Secondary | ICD-10-CM

## 2012-04-22 DIAGNOSIS — G4733 Obstructive sleep apnea (adult) (pediatric): Secondary | ICD-10-CM

## 2012-04-22 DIAGNOSIS — E785 Hyperlipidemia, unspecified: Secondary | ICD-10-CM

## 2012-04-22 DIAGNOSIS — R7309 Other abnormal glucose: Secondary | ICD-10-CM

## 2012-04-22 LAB — FERRITIN: Ferritin: 310.4 ng/mL (ref 22.0–322.0)

## 2012-04-22 NOTE — Assessment & Plan Note (Signed)
Diabetes suspected. a1c drawn and pending,  Low GI diet and wt loss recommended.

## 2012-04-22 NOTE — Assessment & Plan Note (Signed)
Elevated trigs, LDL and low HDL.  Low GI diet trial.

## 2012-04-22 NOTE — Assessment & Plan Note (Signed)
Fatty liver suspected,  But serologies for automimmune disorders ordered and abd ultrasound,

## 2012-04-22 NOTE — Patient Instructions (Addendum)
Your labs were abnormal in several ways.   1) You may be developing diabetes mellitus  2) Your liver enzymes suggest that you may have a condition called fatty liver.  I am ruling out other causes today with blood work.  3) Your cholesterol is high   Losing weight (24 lbs is your first goal) will help you manage all of these conditions.  I am recommending a low glycemic index diet.  This is  my version of a  "Low GI"  Diet:  All of the foods can be found at grocery stores and in bulk at [employer].  The Atkins protein bars and shakes are available in more varieties at [employer], [employer] and [employer].     7 AM Breakfast:  Low carbohydrate Protein  Shakes (I recommend the EAS AdvantEdge "Carb Control" shakes  Or the low carb shakes by Atkins.   Both are available everywhere:  In  cases at [employer]Js  Or in 4 packs at grocery stores and pharmacies  2.5 carbs  (Alternative is  a toasted Arnold's Sandwhich Thin w/ peanut butter, a "Bagel Thin" with cream cheese and salmon) or  a scrambled egg burrito made with a low carb tortilla .  Avoid cereal and bananas, oatmeal too unless you are cooking the old fashioned kind that takes 30-40 minutes to prepare.  the rest is overly processed, has minimal fiber, and is loaded with carbohydrates!   10 AM: Protein bar by Atkins (the snack size, under 200 cal).  There are many varieties , available widely again or in bulk in limited varieties at BJs)  Other so called "protein bars" tend to be loaded with carbohydrates.  Remember, in food advertising, the word "energy" is synonymous for " carbohydrate."  Lunch: sandwich of [place], (or any lunchmeat, grilled meat or canned tuna), fresh avocado, mayonnaise  and cheese on a lower carbohydrate pita bread, flatbread, or tortilla . Ok to use regular mayonnaise. The bread is the only source or carbohydrate that can be decreased (Joseph's makes a pita bread and a flat bread that are 50 cal and 4 net carbs ; Toufayan makes a low carb  flatbread that's 100 cal and 9 net carbs  and  Mission makes a low carb whole wheat tortilla  That is 210 cal and 6 net carbs)  3 PM:  Mid day :  Another protein bar,  Or a  cheese stick (100 cal, 0 carbs),  Or 1 ounce of  almonds, walnuts, pistachios, pecans, peanuts,  Macadamia nuts. Or a Dannon light n Fit greek yogurt, 80 cal 8 net carbs . Avoid "granola"; the dried cranberries and raisins are loaded with carbohydrates. Mixed nuts ok if no raisins or cranberries or dried fruit.      6 PM  Dinner:  "mean and green:"  Meat/chicken/fish or a high protein legume; , with a green salad, and a low GI  Veggie (broccoli, cauliflower, green beans, spinach, brussel sprouts. Lima beans) : Avoid "Low fat dressings, as well as Reyne Dumas and [address]! They are loaded with sugar! Instead use ranch, vinagrette,  Blue cheese, etc.  There is a low carb pasta by Dreamfield's available at [employer] that is acceptable and tastes great. Try Michel Angel's chicken piccata over low carb pasta. The chicken dish is 0 carbs, and can be found in frozen section at BJs and [employer]. Also try [employer]" (pulled pork, no sauce,  0 carbs) and his pot roast.   both  are in the refrigerated section at BJs  You should also substitute Dreamfield's pasta for regular pasta. It tastes great and is low carb  9 PM snack : Breyer's "low carb" fudgsicle or  ice cream bar (Carb Smart line), or  Weight Watcher's ice cream bar , or another "no sugar added" ice cream;a serving of fresh berries/cherries with whipped cream (Avoid bananas, pineapple, grapes  and watermelon on a regular basis because they are high in sugar)   Remember that snack Substitutions should be less than 15 to 20 carbs  Per serving. Remember to subtract fiber grams and sugar alcohols to get the "net carbs."

## 2012-04-22 NOTE — Progress Notes (Signed)
Patient ID: [name], male   DOB: [birth year], 46 y.o.   MRN: [id number]  Patient Active Problem List  Diagnosis  . HYPERLIPIDEMIA  . ANXIETY DEPRESSION  . HYPERTENSION  . OSA (obstructive sleep apnea)  . Polyarthralgia  . Sciatica of right side  . Elevated glucose    Subjective:  CC:   Chief Complaint  Patient presents with  . Follow-up    HPI:   [name] a 46 y.o. male who presents Follow up on chronic problems and new problems.  1) Heel spur right heel , large, no relief with injection,  [employer] orthotics ordered by [facility]  But has not picked it up yet. 2) Cradle cap diagnosed by derm in [place].  Itching ,  Put on a shamppo and itching and scaling are resolving. 2) abnormal labs.  Elevated fasting glucose, and elevated LFTS.    Past Medical History  Diagnosis Date  . Hypertension     essential, benign  . Anxiety and depression   . Chest pain     hx  . Sleep apnea   . HLD (hyperlipidemia)   . Palpitation     hx  . Other chest pain     tightness, pressure  . Precordial pain   . Murmur   . Bruit     L  . Impotence of organic origin   . Edema   . Goiter     Past Surgical History  Procedure Date  . Knee arthroscopy   . Appendectomy 2002    done at [facility]  . Total knee arthroplasty     right         The following portions of the patient's history were reviewed and updated as appropriate: Allergies, current medications, and problem list.    Review of Systems:   12 Pt  review of systems was negative except those addressed in the HPI,     History   Social History  . Marital Status: Married    Spouse Name: N/A    Number of Children: N/A  . Years of Education: N/A   Occupational History  . Maintenance supervisor    Social History Main Topics  . Smoking status: Former Smoker -- 2.0 packs/day for 18 years    Types: Cigarettes    Quit date: 04/24/2005  . Smokeless tobacco: Never Used     Comment: quit 20 years ago    . Alcohol Use: Yes     Comment: 1 a day  . Drug Use: No  . Sexually Active: Not on file   Other Topics Concern  . Not on file   Social History Narrative   Married, gets regular exercise.     Objective:  BP 130/82  Pulse 68  Temp 98.7 F (37.1 C) (Oral)  Resp 16  Wt 234 lb (106.142 kg)  SpO2 96%  General appearance: alert, cooperative and appears stated age Ears: normal TM's and external ear canals both ears Throat: lips, mucosa, and tongue normal; teeth and gums normal Neck: no adenopathy, no carotid bruit, supple, symmetrical, trachea midline and thyroid not enlarged, symmetric, no tenderness/mass/nodules Back: symmetric, no curvature. ROM normal. No CVA tenderness. Lungs: clear to auscultation bilaterally Heart: regular rate and rhythm, S1, S2 normal, no murmur, click, rub or gallop Abdomen: soft, non-tender; bowel sounds normal; no masses,  no organomegaly Pulses: 2+ and symmetric Skin: Skin color, texture, turgor normal. No rashes or lesions Lymph nodes: Cervical, supraclavicular, and axillary nodes normal.  Assessment and Plan:  HYPERLIPIDEMIA Elevated trigs, LDL and low HDL.  Low GI diet trial.   OSA (obstructive sleep apnea) Wearing CPAP a minimum of 6 hours per night   Elevated glucose Diabetes suspected. a1c drawn and pending,  Low GI diet and wt loss recommended.   Transaminitis Fatty liver suspected,  But serologies for automimmune disorders ordered and abd ultrasound,    Updated Medication List Outpatient Encounter Prescriptions as of 04/22/2012  Medication Sig Dispense Refill  . ALPRAZolam (XANAX) 0.5 MG tablet Take 1 tablet (0.5 mg total) by mouth at bedtime as needed.  30 tablet  3  . aspirin 81 MG EC tablet Take 81 mg by mouth daily.        Marland Kitchen buPROPion (WELLBUTRIN SR) 150 MG 12 hr tablet Take 1 tablet (150 mg total) by mouth 2 (two) times daily.  60 tablet  5  . citalopram (CELEXA) 20 MG tablet Take 1 tablet (20 mg total) by mouth daily.  30  tablet  6  . Cyanocobalamin 2500 MCG TABS Take 1 tablet by mouth every other day.        Marland Kitchen HYDROcodone-acetaminophen (VICODIN ES) 7.5-750 MG per tablet Take 1 tablet by mouth every 8 (eight) hours as needed for pain.  90 tablet  2  . levothyroxine (SYNTHROID, LEVOTHROID) 88 MCG tablet Take 1 tablet (88 mcg total) by mouth daily.  30 tablet  3  . lisinopril-hydrochlorothiazide (PRINZIDE,ZESTORETIC) 20-25 MG per tablet Take 1 tablet by mouth daily.  90 tablet  3  . methocarbamol (ROBAXIN) 500 MG tablet TAKE 1 TABLET BY MOUTH EVERY 8 HOURS AS NEEDED  90 tablet  3  . metoprolol tartrate (LOPRESSOR) 25 MG tablet Take 1 tablet (25 mg total) by mouth 2 (two) times daily.  60 tablet  3  . Multiple Vitamin (MULTIVITAMIN) tablet Take 1 tablet by mouth daily.        Marland Kitchen omeprazole (PRILOSEC) 20 MG capsule Take 1 capsule (20 mg total) by mouth daily.  30 capsule  6  . traMADol (ULTRAM) 50 MG tablet Take 1 tablet (50 mg total) by mouth every 6 (six) hours as needed.  60 tablet  3  . vitamin C (ASCORBIC ACID) 500 MG tablet Take 500 mg by mouth daily.        . [DISCONTINUED] ARIPiprazole (ABILIFY) 5 MG tablet Take 1 tablet (5 mg total) by mouth daily.  30 tablet  5

## 2012-04-22 NOTE — Assessment & Plan Note (Signed)
Wearing CPAP a minimum of 6 hours per night

## 2012-04-23 LAB — HEPATITIS C ANTIBODY: HCV Ab: NEGATIVE

## 2012-04-23 LAB — IRON AND TIBC: %SAT: 22 % (ref 20–55)

## 2012-04-23 LAB — HEPATITIS B SURFACE ANTIBODY,QUALITATIVE: Hep B S Ab: NONREACTIVE

## 2012-04-25 ENCOUNTER — Telehealth: Payer: Self-pay | Admitting: Internal Medicine

## 2012-04-25 LAB — PROTEIN ELECTROPHORESIS, SERUM
Albumin ELP: 61.7 % (ref 55.8–66.1)
Alpha-2-Globulin: 10 % (ref 7.1–11.8)
Beta 2: 5.1 % (ref 3.2–6.5)
Beta Globulin: 7 % (ref 4.7–7.2)
Total Protein, Serum Electrophoresis: 6.7 g/dL (ref 6.0–8.3)

## 2012-04-25 NOTE — Telephone Encounter (Signed)
Please ask patient to return for a hgba1c ,.  It was not drawn unfortunately on the last two draw despite being ordered.  And cannot be added.  No rush.

## 2012-04-29 ENCOUNTER — Other Ambulatory Visit: Payer: Self-pay | Admitting: Internal Medicine

## 2012-04-29 NOTE — Telephone Encounter (Signed)
Bupropion SR 150 mg tablet  Take 1 tablet by mouth 2 times daily  # 60

## 2012-04-29 NOTE — Telephone Encounter (Signed)
Patient notified of need to return to lab and have HGBA1C drawn.

## 2012-04-30 MED ORDER — BUPROPION HCL ER (SR) 150 MG PO TB12: 150.0000 mg | ORAL_TABLET | Freq: Two times a day (BID) | ORAL | Status: DC

## 2012-04-30 NOTE — Telephone Encounter (Signed)
Ok to refill,  Authorized in epic 

## 2012-04-30 NOTE — Telephone Encounter (Signed)
Buproprion refill request. Med last filled on 6/3 #60 with 5 refills. Pt last seen on 12/30. Ok to refill?

## 2012-04-30 NOTE — Addendum Note (Signed)
Addended by: [name] on: 04/30/2012 03:21 PM   Modules accepted: Orders

## 2012-04-30 NOTE — Addendum Note (Signed)
Addended by: Jackson Latino on: 04/30/2012 05:17 PM   Modules accepted: Orders

## 2012-04-30 NOTE — Telephone Encounter (Signed)
Med phoned in °

## 2012-05-02 ENCOUNTER — Other Ambulatory Visit: Payer: Self-pay | Admitting: Internal Medicine

## 2012-05-04 NOTE — Telephone Encounter (Signed)
Med filled.  

## 2012-05-17 ENCOUNTER — Encounter: Payer: Self-pay | Admitting: Internal Medicine

## 2012-05-17 DIAGNOSIS — L219 Seborrheic dermatitis, unspecified: Secondary | ICD-10-CM | POA: Insufficient documentation

## 2012-07-04 ENCOUNTER — Other Ambulatory Visit: Payer: Self-pay | Admitting: Internal Medicine

## 2012-07-22 ENCOUNTER — Ambulatory Visit: Payer: BC Managed Care – PPO | Admitting: Internal Medicine

## 2012-07-23 ENCOUNTER — Telehealth: Payer: Self-pay | Admitting: Emergency Medicine

## 2012-07-23 NOTE — Telephone Encounter (Signed)
Proceed with order placed

## 2012-07-23 NOTE — Telephone Encounter (Signed)
Patient called requesting to have a liver ultrasound done. Looks like he had an apt in January for an U/S. He states he cannot remember if he ever went to this apt. Would you like to see him before scheduling another u/s or just proceed with the order placed?

## 2012-07-27 ENCOUNTER — Other Ambulatory Visit: Payer: Self-pay | Admitting: Internal Medicine

## 2012-07-28 NOTE — Telephone Encounter (Signed)
Ok to refill vicodin ,  Authorized in epic

## 2012-07-29 NOTE — Telephone Encounter (Signed)
Med filled on 4/7.

## 2012-07-30 ENCOUNTER — Ambulatory Visit: Payer: Self-pay | Admitting: Internal Medicine

## 2012-07-31 ENCOUNTER — Telehealth: Payer: Self-pay | Admitting: Internal Medicine

## 2012-07-31 NOTE — Telephone Encounter (Signed)
Left message on voice mail  to call back

## 2012-07-31 NOTE — Telephone Encounter (Signed)
I have not received the report ,  Will call him tomorrow when i have received it.  Where did he have it done?

## 2012-07-31 NOTE — Telephone Encounter (Signed)
Patient calling to get results of Abdominal UltraSound done approximately 7:30 am yesterday 07/30/2012. Please review, patient can be reached at [phone].

## 2012-08-01 ENCOUNTER — Telehealth: Payer: Self-pay | Admitting: Internal Medicine

## 2012-08-01 NOTE — Telephone Encounter (Signed)
Waiting for [facility] to fax results to office.

## 2012-08-01 NOTE — Telephone Encounter (Signed)
He still has a fatty liver.  No other abnormalities seen

## 2012-08-01 NOTE — Telephone Encounter (Signed)
[facility] and requested the results again. Waiting for them to fax.

## 2012-08-01 NOTE — Telephone Encounter (Signed)
Pt called back please call him   [phone]

## 2012-08-01 NOTE — Telephone Encounter (Signed)
These results were placed in [facility] folder.

## 2012-08-01 NOTE — Telephone Encounter (Signed)
Pt is calling back about getting his abdominal scan results

## 2012-08-02 NOTE — Telephone Encounter (Signed)
Pt notified of the results.  

## 2012-08-04 ENCOUNTER — Other Ambulatory Visit: Payer: Self-pay | Admitting: Internal Medicine

## 2012-08-29 ENCOUNTER — Encounter: Payer: Self-pay | Admitting: Internal Medicine

## 2012-09-15 ENCOUNTER — Other Ambulatory Visit: Payer: Self-pay | Admitting: Internal Medicine

## 2012-09-17 NOTE — Telephone Encounter (Signed)
OK to refill, last visit 04/22/12?

## 2012-09-18 NOTE — Telephone Encounter (Signed)
Rx faxed to pharmacy. Advised pt that refill done for 1 month, but that he would need a followup visit before the next refill. States he will call back to schedule this appointment.

## 2012-09-18 NOTE — Telephone Encounter (Signed)
Refill one 30 days only. he needs a  6 month follow up prior to more refills on alpraqzolam

## 2012-10-11 ENCOUNTER — Ambulatory Visit (INDEPENDENT_AMBULATORY_CARE_PROVIDER_SITE_OTHER): Payer: BC Managed Care – PPO | Admitting: Adult Health

## 2012-10-11 ENCOUNTER — Telehealth: Payer: Self-pay | Admitting: Internal Medicine

## 2012-10-11 ENCOUNTER — Encounter: Payer: Self-pay | Admitting: Adult Health

## 2012-10-11 VITALS — BP 120/62 | HR 68 | Temp 98.0°F | Resp 12 | Wt 234.0 lb

## 2012-10-11 DIAGNOSIS — H60399 Other infective otitis externa, unspecified ear: Secondary | ICD-10-CM | POA: Insufficient documentation

## 2012-10-11 DIAGNOSIS — H9191 Unspecified hearing loss, right ear: Secondary | ICD-10-CM

## 2012-10-11 DIAGNOSIS — E039 Hypothyroidism, unspecified: Secondary | ICD-10-CM

## 2012-10-11 DIAGNOSIS — K760 Fatty (change of) liver, not elsewhere classified: Secondary | ICD-10-CM

## 2012-10-11 DIAGNOSIS — H9190 Unspecified hearing loss, unspecified ear: Secondary | ICD-10-CM

## 2012-10-11 MED ORDER — LEVOFLOXACIN 500 MG PO TABS: ORAL_TABLET | ORAL | Status: DC

## 2012-10-11 NOTE — Telephone Encounter (Signed)
Pt came in today wanting to know if he could get could get his labs done prior to make an appointment for a med refill.  Pt stated he would like to get thyroid checked

## 2012-10-11 NOTE — Patient Instructions (Addendum)
  Start Levaquin 500 mg daily for 7 days.  You may also want to try Dimetapp elixir (decongestant) to see if this helps with the fullness feeling.  Return to the office on Monday for follow up.

## 2012-10-11 NOTE — Telephone Encounter (Signed)
Patient Information:  Caller Name: Cardell  Phone: [phone]  Patient: [name], [name]  Gender: Male  DOB: 06-24-1965  Age: 47 Years  PCP: Duncan Dull (Adults only)  Office Follow Up:  Does the office need to follow up with this patient?: No  Instructions For The Office: N/A   Symptoms  Reason For Call & Symptoms: pt states he cannot hear out of right ear at all.  Pt states he woke up this am and it was like that.  Pt also reports having a headache  Reviewed Health History In EMR: Yes  Reviewed Medications In EMR: Yes  Reviewed Allergies In EMR: Yes  Reviewed Surgeries / Procedures: Yes  Date of Onset of Symptoms: 10/11/2012  Treatments Tried: Tylenol  Treatments Tried Worked: No  Guideline(s) Used:  Ear - Congestion  Hearing Loss  Disposition Per Guideline:   Go to Office Now  Reason For Disposition Reached:   Hearing loss in one or both ears of sudden onset and present now  Advice Given:  N/A  Patient Will Follow Care Advice:  YES  Appointment Scheduled:  10/11/2012 15:30:00 Appointment Scheduled Provider:  Orville Govern

## 2012-10-11 NOTE — Assessment & Plan Note (Addendum)
Acute onset upon awakening. White frothy substance from right ear. Gently irrigated but not improvement. Start levaquin and follow up on Monday. May need referral to ENT.

## 2012-10-11 NOTE — Progress Notes (Signed)
  Subjective:    Patient ID: [name], male    DOB: 1965/06/01, 47 y.o.   MRN: [id number]  HPI  Patient is a 47 year old male who presents with complaints of waking up with inability to hear out of the right ear. He denies recent upper respiratory infection. He denies postnasal drip or any drainage. Denies fever or chills. He does not have any tinnitus but does have any buzzing sound in his right ear.   Current Outpatient Prescriptions on File Prior to Visit  Medication Sig Dispense Refill  . ALPRAZolam (XANAX) 0.5 MG tablet Take 1 tablet (0.5 mg total) by mouth at bedtime as needed.  30 tablet  3  . aspirin 81 MG EC tablet Take 81 mg by mouth daily.        Marland Kitchen buPROPion (WELLBUTRIN SR) 150 MG 12 hr tablet Take 1 tablet (150 mg total) by mouth 2 (two) times daily.  60 tablet  5  . citalopram (CELEXA) 20 MG tablet TAKE 1 TABLET (20 MG TOTAL) BY MOUTH DAILY.  30 tablet  6  . HYDROcodone-acetaminophen (VICODIN ES) 7.5-750 MG per tablet TAKE 1 TABLETBY MOUTH EVERY 8HRS AS NEEDED FOR PAIN  90 tablet  2  . levothyroxine (SYNTHROID, LEVOTHROID) 88 MCG tablet TAKE 1 TABLET (88 MCG TOTAL) BY MOUTH DAILY.  30 tablet  5  . lisinopril-hydrochlorothiazide (PRINZIDE,ZESTORETIC) 20-25 MG per tablet TAKE 1 TABLET BY MOUTH DAILY.  30 tablet  3  . methocarbamol (ROBAXIN) 500 MG tablet TAKE 1 TABLET BY MOUTH EVERY 8 HOURS AS NEEDED  90 tablet  3  . metoprolol tartrate (LOPRESSOR) 25 MG tablet Take 1 tablet (25 mg total) by mouth 2 (two) times daily.  60 tablet  3  . Multiple Vitamin (MULTIVITAMIN) tablet Take 1 tablet by mouth daily.        Marland Kitchen omeprazole (PRILOSEC) 20 MG capsule TAKE 1 CAPSULE (20 MG TOTAL) BY MOUTH DAILY.  30 capsule  6  . traMADol (ULTRAM) 50 MG tablet Take 1 tablet (50 mg total) by mouth every 6 (six) hours as needed.  60 tablet  3  . vitamin C (ASCORBIC ACID) 500 MG tablet Take 500 mg by mouth daily.         No current facility-administered medications on file prior to visit.      Review of Systems  Constitutional: Negative for fever and chills.  HENT: Negative for congestion, rhinorrhea, postnasal drip, sinus pressure and tinnitus.        Buzzing in right ear. Denies ear pain     BP 120/62  Pulse 68  Temp(Src) 98 F (36.7 C) (Oral)  Resp 12  Wt 234 lb (106.142 kg)  BMI 31.73 kg/m2  SpO2 97%    Objective:   Physical Exam  Constitutional: He is oriented to person, place, and time.  HENT:  White frothy substance within right ear canal obstructing TM. Pt without pain.  Neurological: He is alert and oriented to person, place, and time.  Psychiatric: He has a normal mood and affect. His behavior is normal. Judgment and thought content normal.          Assessment & Plan:

## 2012-10-12 NOTE — Telephone Encounter (Signed)
Certainly ,  Labs ordered,   mychart message sent to patient

## 2012-10-14 ENCOUNTER — Ambulatory Visit: Payer: BC Managed Care – PPO | Admitting: Adult Health

## 2012-10-18 ENCOUNTER — Other Ambulatory Visit: Payer: BC Managed Care – PPO

## 2012-10-22 ENCOUNTER — Encounter: Payer: Self-pay | Admitting: *Deleted

## 2012-10-22 ENCOUNTER — Encounter: Payer: Self-pay | Admitting: Internal Medicine

## 2012-10-22 ENCOUNTER — Ambulatory Visit (INDEPENDENT_AMBULATORY_CARE_PROVIDER_SITE_OTHER): Payer: BC Managed Care – PPO | Admitting: Internal Medicine

## 2012-10-22 VITALS — BP 132/76 | HR 65 | Temp 98.6°F | Resp 14 | Wt 236.5 lb

## 2012-10-22 DIAGNOSIS — E039 Hypothyroidism, unspecified: Secondary | ICD-10-CM

## 2012-10-22 DIAGNOSIS — R7401 Elevation of levels of liver transaminase levels: Secondary | ICD-10-CM

## 2012-10-22 DIAGNOSIS — E1169 Type 2 diabetes mellitus with other specified complication: Secondary | ICD-10-CM

## 2012-10-22 DIAGNOSIS — E669 Obesity, unspecified: Secondary | ICD-10-CM

## 2012-10-22 DIAGNOSIS — E119 Type 2 diabetes mellitus without complications: Secondary | ICD-10-CM

## 2012-10-22 DIAGNOSIS — F411 Generalized anxiety disorder: Secondary | ICD-10-CM

## 2012-10-22 DIAGNOSIS — K7689 Other specified diseases of liver: Secondary | ICD-10-CM

## 2012-10-22 DIAGNOSIS — E785 Hyperlipidemia, unspecified: Secondary | ICD-10-CM

## 2012-10-22 DIAGNOSIS — K760 Fatty (change of) liver, not elsewhere classified: Secondary | ICD-10-CM

## 2012-10-22 DIAGNOSIS — H60391 Other infective otitis externa, right ear: Secondary | ICD-10-CM

## 2012-10-22 DIAGNOSIS — H60399 Other infective otitis externa, unspecified ear: Secondary | ICD-10-CM

## 2012-10-22 DIAGNOSIS — R7309 Other abnormal glucose: Secondary | ICD-10-CM

## 2012-10-22 LAB — COMPREHENSIVE METABOLIC PANEL
CO2: 33 mEq/L — ABNORMAL HIGH (ref 19–32)
Calcium: 9.9 mg/dL (ref 8.4–10.5)
GFR: 89.95 mL/min (ref >60.00)
Glucose, Bld: 178 mg/dL — ABNORMAL HIGH (ref 70–99)
Sodium: 134 mEq/L — ABNORMAL LOW (ref 135–145)
Total Bilirubin: 0.8 mg/dL (ref 0.3–1.2)
Total Protein: 7.2 g/dL (ref 6.0–8.3)

## 2012-10-22 LAB — LIPID PANEL: HDL: 37 mg/dL — ABNORMAL LOW (ref >39.00)

## 2012-10-22 LAB — TSH: TSH: 2.83 u[IU]/mL (ref 0.35–5.50)

## 2012-10-22 MED ORDER — ALPRAZOLAM 0.5 MG PO TABS
0.5000 mg | ORAL_TABLET | Freq: Every evening | ORAL | Status: DC | PRN
Start: 2012-10-22 — End: 2012-10-22

## 2012-10-22 MED ORDER — ALPRAZOLAM 0.5 MG PO TABS: 0.5000 mg | ORAL_TABLET | Freq: Every evening | ORAL | Status: DC | PRN

## 2012-10-22 MED ORDER — HYDROCODONE-ACETAMINOPHEN 7.5-750 MG PO TABS: ORAL_TABLET | ORAL | Status: DC

## 2012-10-22 NOTE — Patient Instructions (Addendum)
You need to lose 24 lbs over the next 6 to 8 months, This is  One version of a  "Low GI"  Diet:  It will  allow you to lose 6 to 10 lbs  per month if you follow it carefully.  Your goal with exercise is a minimum of 30 minutes of aerobic exercise 5 days per week (Walking does not count once it becomes easy!)    All of the foods can be found at grocery stores and in bulk at [employer].  The Atkins protein bars and shakes are available in more varieties at Target, WalMart and [employer]'s Foods.     7 AM Breakfast:  Choose from the following:  Low carbohydrate Protein  Shakes (I recommend the EAS AdvantEdge "Carb Control" shakes  Or the low carb shakes by Atkins.    2.5 carbs   Arnold's "Sandwhich Thin"toasted  w/ peanut butter (no jelly: about 20 net carbs  "Bagel Thin" with cream cheese and salmon: about 20 carbs   a scrambled egg/bacon/cheese burrito made with Mission's "carb balance" whole wheat tortilla  (about 10 net carbs )   Avoid cereal and bananas, oatmeal and cream of wheat and grits. They are loaded with carbohydrates!   10 AM: high protein snack  Protein bar by Atkins (the snack size, under 200 cal, usually < 6 net carbs).    A stick of cheese:  Around 1 carb,  100 cal     Dannon Light n Fit [place] Yogurt  (80 cal, 8 carbs)  Other so called "protein bars" and Greek yogurts tend to be loaded with carbohydrates.  Remember, in food advertising, the word "energy" is synonymous for " carbohydrate."  Lunch:   A Sandwich using the bread choices listed, Can use any  Eggs,  lunchmeat, grilled meat or canned tuna), avocado, regular mayo/mustard  and cheese.  A Salad using blue cheese, ranch,  Goddess or vinagrette,  No croutons or "confetti" and no "candied nuts" but regular nuts OK.   No pretzels or chips.  Pickles and miniature sweet peppers are a good low carb alternative that provide a "crunch"  The bread is the only source of carbohydrate in a sandwich and  can be decreased by trying some of  these alternatives to traditional loaf bread  Joseph's makes a pita bread and a flat bread that are 50 cal and 4 net carbs available at BJs and WalMart.  This can be toasted to use with hummous as well  Toufayan makes a low carb flatbread that's 100 cal and 9 net carbs available at [employer] and [employer] makes 2 sizes of  Low carb whole wheat tortilla  (The large one is 210 cal and 6 net carbs) Avoid "Low fat dressings, as well as Reyne Dumas and [address] dressings They are loaded with sugar!   3 PM/ Mid day  Snack:  Consider  1 ounce of  almonds, walnuts, pistachios, pecans, peanuts,  Macadamia nuts or a nut medley.  Avoid "granola"; the dried cranberries and raisins are loaded with carbohydrates. Mixed nuts as long as there are no raisins,  cranberries or dried fruit.     6 PM  Dinner:     Meat/fowl/fish with a green salad, and either broccoli, cauliflower, green beans, spinach, brussel sprouts or  Lima beans. DO NOT BREAD THE PROTEIN!!      There is a low carb pasta by Dreamfield's that is acceptable and tastes great: only 5 digestible carbs/serving.(  All grocery stores but BJs carry it )  Try Kai Levins Angelo's chicken piccata or chicken or eggplant parm over low carb pasta.(Lowes and BJs)   Clifton Custard Sanchez's "Carnitas" (pulled pork, no sauce,  0 carbs) or his beef pot roast to make a dinner burrito (at BJ's)  Pesto over low carb pasta (bj's sells a good quality pesto in the center refrigerated section of the deli   Whole wheat pasta is still full of digestible carbs and  Not as low in glycemic index as Dreamfield's.   Brown rice is still rice,  So skip the rice and noodles if you eat [place] or [name] (or at least limit to 1/2 cup)  9 PM snack :   Breyer's "low carb" fudgsicle or  ice cream bar (Carb Smart line), or  Weight Watcher's ice cream bar , or another "no sugar added" ice cream;  a serving of fresh berries/cherries with whipped cream   Cheese or DANNON[employer]S [place] [place]  YOGURT  Avoid bananas, pineapple, grapes  and watermelon on a regular basis because they are high in sugar.  THINK OF THEM AS DESSERT  Remember that snack Substitutions should be less than 10 NET carbs per serving and meals < 20 carbs. Remember to subtract fiber grams to get the "net carbs."

## 2012-10-22 NOTE — Progress Notes (Signed)
Patient ID: [name], male   DOB: 1965/12/30, 47 y.o.   MRN: [id number]  Patient Active Problem List   Diagnosis Date Noted  . Diabetes mellitus type 2 in obese 10/23/2012  . Otitis, externa, infective 10/11/2012  . Seborrheic dermatitis, unspecified 05/17/2012  . Elevated glucose 04/22/2012  . Fatty liver 04/22/2012  . Polyarthralgia 12/24/2010  . Sciatica of right side 12/24/2010  . HYPERLIPIDEMIA 01/13/2009  . ANXIETY DEPRESSION 01/13/2009  . HYPERTENSION 01/13/2009  . OSA (obstructive sleep apnea) 01/13/2009    Subjective:  CC:   Chief Complaint  Patient presents with  . Follow-up    medication refills    HPI:   [name] a 47 y.o. male who presents Follow up on elevated liver enzymes and abdominal ultrasound which suggest fatty liver.  abnornally elevated glucose readings also concerning for new onset diabetes  Hgba1c pending.  Diet reviewed. He drinks one alcoholic beverage per night.  Not exercising,  skips lunch a lot. Drinks a sugared soft drink daily .  Eats chicken dried steak every 2 weeks.  other meals are often restaurant food.  BMI is 32.    Past Medical History  Diagnosis Date  . Hypertension     essential, benign  . Anxiety and depression   . Chest pain     hx  . Sleep apnea   . HLD (hyperlipidemia)   . Palpitation     hx  . Other chest pain     tightness, pressure  . Precordial pain   . Murmur   . Bruit     L  . Impotence of organic origin   . Edema   . Goiter     Past Surgical History  Procedure Laterality Date  . Knee arthroscopy    . Appendectomy  2002    done at [facility]  . Total knee arthroplasty      right       The following portions of the patient's history were reviewed and updated as appropriate: Allergies, current medications, and problem list.    Review of Systems:   Patient denies headache, fevers, malaise, unintentional weight loss, skin rash, eye pain, sinus congestion and sinus pain, sore throat,  dysphagia,  hemoptysis , cough, dyspnea, wheezing, chest pain, palpitations, orthopnea, edema, abdominal pain, nausea, melena, diarrhea, constipation, flank pain, dysuria, hematuria, urinary  Frequency, nocturia, numbness, tingling, seizures,  Focal weakness, Loss of consciousness,  Tremor, insomnia, depression, anxiety, and suicidal ideation.     History   Social History  . Marital Status: Married    Spouse Name: N/A    Number of Children: N/A  . Years of Education: N/A   Occupational History  . Maintenance supervisor    Social History Main Topics  . Smoking status: Former Smoker -- 2.00 packs/day for 18 years    Types: Cigarettes    Quit date: 04/24/2005  . Smokeless tobacco: Never Used     Comment: quit 20 years ago   . Alcohol Use: Yes     Comment: 1 a day  . Drug Use: No  . Sexually Active: Not on file   Other Topics Concern  . Not on file   Social History Narrative   Married, gets regular exercise.     Objective:  BP 132/76  Pulse 65  Temp(Src) 98.6 F (37 C) (Oral)  Resp 14  Wt 236 lb 8 oz (107.276 kg)  BMI 32.07 kg/m2  SpO2 98%  General appearance: alert, cooperative and appears  stated age Ears: right TM occluded by pearly gray exudate filling canal.  Normal left  TM's  Throat: lips, mucosa, and tongue normal; teeth and gums normal Neck: no adenopathy, no carotid bruit, supple, symmetrical, trachea midline and thyroid not enlarged, symmetric, no tenderness/mass/nodules Back: symmetric, no curvature. ROM normal. No CVA tenderness. Lungs: clear to auscultation bilaterally Heart: regular rate and rhythm, S1, S2 normal, no murmur, click, rub or gallop Abdomen: soft, non-tender; bowel sounds normal; no masses,  no organomegaly Pulses: 2+ and symmetric Skin: Skin color, texture, turgor normal. No rashes or lesions Lymph nodes: Cervical, supraclavicular, and axillary nodes normal.  Assessment and Plan:  Fatty liver  Based on abdominal ultrasound, metabolic  syndrome and serologies negative for other causes of hepatitis.   Discussed  the long term consequences including cirrhosis,  as well as management with weight loss of 24 lbs (10%) and control of hyperlipidemia, and tentative diagosis of DM.  Low glycemic index advised and referral to [name] at [facility] for definitive diagnosis.    Otitis, externa, infective Persistent exudative infection of right ear noted on June 20th , treated with oral levaquin  Without improvement   Advised to follow up with ENT for drainage and treatment for presumed fungal infection    HYPERLIPIDEMIA LDl 163 and trigs 248 in the setting of new onset DM with fasting glucose of 178.  Will discuss therapy at follow up after low glycemic index diet. Has been employed.  Diabetes mellitus type 2 in obese fasting glucose was 178.,  But hgba1c was not resulted.  Return in one month for instruction on checking blood sugars. Low glyceminc index diet discussed and advised.   A total of 40 minutes was spent with patient more than half of which was spent in counseling, reviewing records from other prviders and coordination of care.  Updated Medication List Outpatient Encounter Prescriptions as of 10/22/2012  Medication Sig Dispense Refill  . ALPRAZolam (XANAX) 0.5 MG tablet Take 1 tablet (0.5 mg total) by mouth at bedtime as needed.  30 tablet  3  . aspirin 81 MG EC tablet Take 81 mg by mouth daily.        . Multiple Vitamin (MULTIVITAMIN) tablet Take 1 tablet by mouth daily.        . traMADol (ULTRAM) 50 MG tablet Take 1 tablet (50 mg total) by mouth every 6 (six) hours as needed.  60 tablet  3  . vitamin C (ASCORBIC ACID) 500 MG tablet Take 500 mg by mouth daily.        . [DISCONTINUED] ALPRAZolam (XANAX) 0.5 MG tablet Take 1 tablet (0.5 mg total) by mouth at bedtime as needed.  30 tablet  3  . [DISCONTINUED] ALPRAZolam (XANAX) 0.5 MG tablet Take 1 tablet (0.5 mg total) by mouth at bedtime as needed.  30 tablet  3  . [DISCONTINUED]  ALPRAZolam (XANAX) 0.5 MG tablet Take 1 tablet (0.5 mg total) by mouth at bedtime as needed.  30 tablet  3  . [DISCONTINUED] buPROPion (WELLBUTRIN SR) 150 MG 12 hr tablet Take 1 tablet (150 mg total) by mouth 2 (two) times daily.  60 tablet  5  . [DISCONTINUED] citalopram (CELEXA) 20 MG tablet TAKE 1 TABLET (20 MG TOTAL) BY MOUTH DAILY.  30 tablet  6  . [DISCONTINUED] HYDROcodone-acetaminophen (VICODIN ES) 7.5-750 MG per tablet TAKE 1 TABLETBY MOUTH EVERY 8HRS AS NEEDED FOR PAIN  90 tablet  2  . [DISCONTINUED] HYDROcodone-acetaminophen (VICODIN ES) 7.5-750 MG per tablet TAKE  1 TABLETBY MOUTH EVERY 8HRS AS NEEDED FOR PAIN  90 tablet  2  . [DISCONTINUED] levothyroxine (SYNTHROID, LEVOTHROID) 88 MCG tablet TAKE 1 TABLET (88 MCG TOTAL) BY MOUTH DAILY.  30 tablet  5  . [DISCONTINUED] lisinopril-hydrochlorothiazide (PRINZIDE,ZESTORETIC) 20-25 MG per tablet TAKE 1 TABLET BY MOUTH DAILY.  30 tablet  3  . [DISCONTINUED] methocarbamol (ROBAXIN) 500 MG tablet TAKE 1 TABLET BY MOUTH EVERY 8 HOURS AS NEEDED  90 tablet  3  . [DISCONTINUED] omeprazole (PRILOSEC) 20 MG capsule TAKE 1 CAPSULE (20 MG TOTAL) BY MOUTH DAILY.  30 capsule  6  . [DISCONTINUED] levofloxacin (LEVAQUIN) 500 MG tablet Take 1 tablet daily for 7 days.  7 tablet  0  . [DISCONTINUED] metoprolol tartrate (LOPRESSOR) 25 MG tablet Take 1 tablet (25 mg total) by mouth 2 (two) times daily.  60 tablet  3   No facility-administered encounter medications on file as of 10/22/2012.     Orders Placed This Encounter  Procedures  . LDL cholesterol, direct  . Hemoglobin A1c  . Microalbumin / creatinine urine ratio  . Ambulatory referral to Gastroenterology    Return in about 3 months (around 01/22/2013).

## 2012-10-23 ENCOUNTER — Encounter (HOSPITAL_COMMUNITY): Payer: Self-pay | Admitting: Emergency Medicine

## 2012-10-23 ENCOUNTER — Emergency Department (HOSPITAL_COMMUNITY)
Admission: EM | Admit: 2012-10-23 | Discharge: 2012-10-23 | Disposition: A | Discharge status: Home / Self Care | Payer: BC Managed Care – PPO | Attending: Emergency Medicine | Admitting: Emergency Medicine

## 2012-10-23 DIAGNOSIS — Z87891 Personal history of nicotine dependence: Secondary | ICD-10-CM | POA: Insufficient documentation

## 2012-10-23 DIAGNOSIS — Z79899 Other long term (current) drug therapy: Secondary | ICD-10-CM | POA: Insufficient documentation

## 2012-10-23 DIAGNOSIS — E049 Nontoxic goiter, unspecified: Secondary | ICD-10-CM | POA: Insufficient documentation

## 2012-10-23 DIAGNOSIS — Z7982 Long term (current) use of aspirin: Secondary | ICD-10-CM | POA: Insufficient documentation

## 2012-10-23 DIAGNOSIS — E669 Obesity, unspecified: Secondary | ICD-10-CM | POA: Insufficient documentation

## 2012-10-23 DIAGNOSIS — R011 Cardiac murmur, unspecified: Secondary | ICD-10-CM | POA: Insufficient documentation

## 2012-10-23 DIAGNOSIS — E119 Type 2 diabetes mellitus without complications: Secondary | ICD-10-CM | POA: Insufficient documentation

## 2012-10-23 DIAGNOSIS — H9191 Unspecified hearing loss, right ear: Secondary | ICD-10-CM

## 2012-10-23 DIAGNOSIS — E1140 Type 2 diabetes mellitus with diabetic neuropathy, unspecified: Secondary | ICD-10-CM | POA: Insufficient documentation

## 2012-10-23 DIAGNOSIS — F341 Dysthymic disorder: Secondary | ICD-10-CM | POA: Insufficient documentation

## 2012-10-23 DIAGNOSIS — H6091 Unspecified otitis externa, right ear: Secondary | ICD-10-CM

## 2012-10-23 DIAGNOSIS — I10 Essential (primary) hypertension: Secondary | ICD-10-CM | POA: Insufficient documentation

## 2012-10-23 DIAGNOSIS — H9319 Tinnitus, unspecified ear: Secondary | ICD-10-CM | POA: Insufficient documentation

## 2012-10-23 DIAGNOSIS — J3489 Other specified disorders of nose and nasal sinuses: Secondary | ICD-10-CM | POA: Insufficient documentation

## 2012-10-23 DIAGNOSIS — E785 Hyperlipidemia, unspecified: Secondary | ICD-10-CM | POA: Insufficient documentation

## 2012-10-23 DIAGNOSIS — H9190 Unspecified hearing loss, unspecified ear: Secondary | ICD-10-CM | POA: Insufficient documentation

## 2012-10-23 DIAGNOSIS — H6690 Otitis media, unspecified, unspecified ear: Secondary | ICD-10-CM | POA: Insufficient documentation

## 2012-10-23 DIAGNOSIS — H6691 Otitis media, unspecified, right ear: Secondary | ICD-10-CM

## 2012-10-23 DIAGNOSIS — H60399 Other infective otitis externa, unspecified ear: Secondary | ICD-10-CM | POA: Insufficient documentation

## 2012-10-23 MED ORDER — AMOXICILLIN-POT CLAVULANATE 875-125 MG PO TABS: 1.0000 | ORAL_TABLET | Freq: Two times a day (BID) | ORAL | Status: DC

## 2012-10-23 MED ORDER — OFLOXACIN 0.3 % OT SOLN: 5.0000 [drp] | Freq: Two times a day (BID) | OTIC | Status: DC

## 2012-10-23 NOTE — ED Notes (Signed)
Pt c/o right ear pain x 10 days; pt sts seen by ENT and given drops but not feeling better; pt sts itching in ear

## 2012-10-23 NOTE — Assessment & Plan Note (Signed)
LDl 163 and trigs 248 in the setting of new onset DM with fasting glucose of 178.  Will discuss therapy at follow up after low glycemic index diet. Has been employed.

## 2012-10-23 NOTE — Assessment & Plan Note (Addendum)
Persistent exudative infection of right ear noted on June 20th , treated with oral levaquin  Without improvement   Advised to follow up with ENT for drainage and treatment for presumed fungal infection

## 2012-10-23 NOTE — Assessment & Plan Note (Addendum)
Based on abdominal ultrasound, metabolic syndrome and serologies negative for other causes of hepatitis.   Discussed  the long term consequences including cirrhosis,  as well as management with weight loss of 24 lbs (10%) and control of hyperlipidemia, and tentative diagosis of DM.  Low glycemic index advised and referral to Marlou Starks at [facility] for definitive diagnosis.

## 2012-10-23 NOTE — ED Notes (Signed)
Pt returned to room  

## 2012-10-23 NOTE — Assessment & Plan Note (Signed)
hgba1c has been ordered but is still pending.

## 2012-10-23 NOTE — ED Notes (Signed)
Pt has pager from second floor OR waiting. His wife is having sx today. States he has to go to talk to her surgeon at this time.

## 2012-10-23 NOTE — Assessment & Plan Note (Addendum)
fasting glucose was 178.,  But hgba1c was not resulted.  Return in one month for instruction on checking blood sugars. Low glyceminc index diet discussed and advised.

## 2012-10-23 NOTE — ED Provider Notes (Signed)
History    CSN: [id number] Arrival date & time 10/23/12  1250  First MD Initiated Contact with Patient 10/23/12 1345     Chief Complaint  Patient presents with  . Otalgia   (Consider location/radiation/quality/duration/timing/severity/associated sxs/prior Treatment) Patient is a 47 y.o. male presenting with ear pain. The history is provided by the patient and medical records. No language interpreter was used.  Otalgia Location:  Right Quality:  Pressure Severity:  Mild Onset quality:  Gradual Duration:  2 weeks Timing:  Constant Progression:  Unchanged Chronicity:  New Context: not direct blow, not elevation change, not foreign body in ear, not loud noise and no water in ear   Relieved by:  Nothing Exacerbated by: nothing. Associated symptoms: tinnitus   Associated symptoms: no abdominal pain, no congestion, no cough, no diarrhea, no ear discharge, no fever, no headaches, no hearing loss, no neck pain, no rash, no rhinorrhea, no sore throat and no vomiting   Risk factors: no recent travel, no chronic ear infection and no prior ear surgery     JULIEN OSCAR is a 47 y.o. male  with a hx of HTN, goiter presents to the Emergency Department complaining of gradual, persistent, progressively worsening right ear itching and decreased beginning 2 weeks ago. Associated symptoms include mild pain, decreased hearing and severe itching in the ear.  Nothing makes it better and nothing makes it worse.  Pt denies fever, chills, neck pain, chest pain, SOB, abd pain, N/V/D, jaw pain, dental pain, sore throat, sinus congestion.  Pt saw PCP 2 weeks ago and gave Levaquin Rx for 7 days.  Pt saw ENT who gave drops for infection after clearing out cerumen from the right ear.  [facility] Dr Willeen Cass.     Past Medical History  Diagnosis Date  . Hypertension     essential, benign  . Anxiety and depression   . Chest pain     hx  . Sleep apnea   . HLD (hyperlipidemia)   . Palpitation     hx  .  Other chest pain     tightness, pressure  . Precordial pain   . Murmur   . Bruit     L  . Impotence of organic origin   . Edema   . Goiter    Past Surgical History  Procedure Laterality Date  . Knee arthroscopy    . Appendectomy  2002    done at [facility]  . Total knee arthroplasty      right   Family History  Problem Relation Age of Onset  . Heart disease Father    History  Substance Use Topics  . Smoking status: Former Smoker -- 2.00 packs/day for 18 years    Types: Cigarettes    Quit date: 04/24/2005  . Smokeless tobacco: Never Used     Comment: quit 20 years ago   . Alcohol Use: Yes     Comment: 1 a day    Review of Systems  Constitutional: Negative for fever, chills, appetite change and fatigue.  HENT: Positive for ear pain and tinnitus. Negative for hearing loss, congestion, sore throat, rhinorrhea, mouth sores, neck pain, neck stiffness, postnasal drip, sinus pressure and ear discharge.   Eyes: Negative for visual disturbance.  Respiratory: Negative for cough, chest tightness, shortness of breath, wheezing and stridor.   Cardiovascular: Negative for chest pain, palpitations and leg swelling.  Gastrointestinal: Negative for nausea, vomiting, abdominal pain and diarrhea.  Genitourinary: Negative for dysuria, urgency, frequency and  hematuria.  Musculoskeletal: Negative for myalgias, back pain and arthralgias.  Skin: Negative for rash.  Neurological: Negative for syncope, light-headedness, numbness and headaches.  Hematological: Negative for adenopathy.  Psychiatric/Behavioral: The patient is not nervous/anxious.   All other systems reviewed and are negative.    Allergies  Review of patient's allergies indicates no known allergies.  Home Medications   Current Outpatient Rx  Name  Route  Sig  Dispense  Refill  . ALPRAZolam (XANAX) 0.5 MG tablet   Oral   Take 1 tablet (0.5 mg total) by mouth at bedtime as needed.   30 tablet   3   . aspirin 81 MG EC tablet    Oral   Take 81 mg by mouth daily.           Marland Kitchen buPROPion (WELLBUTRIN SR) 150 MG 12 hr tablet   Oral   Take 150 mg by mouth 2 (two) times daily.         . citalopram (CELEXA) 20 MG tablet   Oral   Take 20 mg by mouth daily.         Marland Kitchen HYDROcodone-acetaminophen (VICODIN ES) 7.5-750 MG per tablet   Oral   Take 1 tablet by mouth every 6 (six) hours as needed for pain.         Marland Kitchen levothyroxine (SYNTHROID, LEVOTHROID) 88 MCG tablet   Oral   Take 88 mcg by mouth daily before breakfast.         . lisinopril-hydrochlorothiazide (PRINZIDE,ZESTORETIC) 20-25 MG per tablet   Oral   Take 1 tablet by mouth daily.         . methocarbamol (ROBAXIN) 500 MG tablet   Oral   Take 500 mg by mouth every 8 (eight) hours as needed (muscle spasms).         . metoprolol tartrate (LOPRESSOR) 25 MG tablet   Oral   Take 25 mg by mouth 2 (two) times daily.         . Multiple Vitamin (MULTIVITAMIN) tablet   Oral   Take 1 tablet by mouth daily.           Marland Kitchen omeprazole (PRILOSEC) 20 MG capsule   Oral   Take 20 mg by mouth daily.         . traMADol (ULTRAM) 50 MG tablet   Oral   Take 1 tablet (50 mg total) by mouth every 6 (six) hours as needed.   60 tablet   3   . vitamin C (ASCORBIC ACID) 500 MG tablet   Oral   Take 500 mg by mouth daily.           Marland Kitchen amoxicillin-clavulanate (AUGMENTIN) 875-125 MG per tablet   Oral   Take 1 tablet by mouth every 12 (twelve) hours.   14 tablet   0   . ofloxacin (FLOXIN) 0.3 % otic solution   Otic   Place 5 drops in ear(s) 2 (two) times daily.   5 mL   0    BP 124/73  Pulse 67  Temp(Src) 98 F (36.7 C) (Oral)  Resp 16  SpO2 96% Physical Exam  Constitutional: He is oriented to person, place, and time. He appears well-developed and well-nourished. No distress.  HENT:  Head: Normocephalic and atraumatic.  Right Ear: There is drainage. Decreased hearing is noted.  Left Ear: Tympanic membrane, external ear and ear canal normal.   Nose: Mucosal edema and rhinorrhea present. No epistaxis. Right sinus exhibits no maxillary sinus tenderness and  no frontal sinus tenderness. Left sinus exhibits no maxillary sinus tenderness and no frontal sinus tenderness.  Mouth/Throat: Uvula is midline and mucous membranes are normal. Mucous membranes are not pale and not cyanotic. No oropharyngeal exudate, posterior oropharyngeal edema, posterior oropharyngeal erythema or tonsillar abscesses.  Right ear with swelling and erythema of the canal Canal occluded by purulent drainage  Eyes: Conjunctivae and EOM are normal. Pupils are equal, round, and reactive to light.  Neck: Normal range of motion and full passive range of motion without pain. Neck supple.  Cardiovascular: Normal rate, normal heart sounds and intact distal pulses.   No murmur heard. Pulmonary/Chest: Effort normal and breath sounds normal. No stridor. No respiratory distress. He has no wheezes.  Musculoskeletal: Normal range of motion.  Lymphadenopathy:    He has no cervical adenopathy.  Neurological: He is alert and oriented to person, place, and time. No cranial nerve deficit. He exhibits normal muscle tone. Coordination normal.  No cranial nerve deficit  Skin: Skin is warm and dry. No rash noted. He is not diaphoretic. No erythema.  Psychiatric: He has a normal mood and affect.    ED Course  Procedures (including critical care time) Labs Reviewed - No data to display No results found. 1. Otitis media not resolved, right   2. Hearing loss of right ear   3. Otitis externa of right ear     MDM  [name] presents with persistent OE.  No canal occlusion, Pt afebrile in NAD. Exam non concerning for mastoiditis, cellulitis or malignant OE. Concern for possible fungal infection as exudate is "fluffy" in appearance.    After ear wash; canal significantly erythematous and mildly edematous.  TM erythematous, bulging with purulent middle ear effusion.  Will treat with  by mouth antibiotics as well as ofloxacin.  Advised ENT follow up in 2-3 days for further evaluation.  I have also discussed reasons to return immediately to the ER.  Patient expresses understanding and agrees with plan.    Dahlia Client Adrik Khim, PA-C 10/23/12 1447

## 2012-10-23 NOTE — ED Provider Notes (Signed)
Medical screening examination/treatment/procedure(s) were performed by non-physician practitioner and as supervising physician I was immediately available for consultation/collaboration.   Gwyneth Sprout, MD 10/23/12 1534

## 2012-10-27 NOTE — Addendum Note (Signed)
Addended by: [name] on: 10/27/2012 09:22 AM   Modules accepted: Orders

## 2012-11-06 ENCOUNTER — Other Ambulatory Visit: Payer: Self-pay | Admitting: Internal Medicine

## 2012-11-06 NOTE — Telephone Encounter (Signed)
Okay to refill? 

## 2012-11-10 ENCOUNTER — Other Ambulatory Visit: Payer: Self-pay | Admitting: Internal Medicine

## 2012-12-01 ENCOUNTER — Other Ambulatory Visit: Payer: Self-pay | Admitting: Internal Medicine

## 2012-12-06 ENCOUNTER — Other Ambulatory Visit: Payer: Self-pay | Admitting: Internal Medicine

## 2013-01-02 ENCOUNTER — Other Ambulatory Visit: Payer: Self-pay | Admitting: Internal Medicine

## 2013-01-02 NOTE — Telephone Encounter (Signed)
Okay to refill Robaxin 

## 2013-01-22 ENCOUNTER — Ambulatory Visit: Payer: BC Managed Care – PPO | Admitting: Internal Medicine

## 2013-01-23 LAB — HEMOGLOBIN A1C: Hgb A1c MFr Bld: 7.8 % — AB (ref 4.0–6.0)

## 2013-01-29 ENCOUNTER — Encounter: Payer: Self-pay | Admitting: *Deleted

## 2013-01-30 ENCOUNTER — Encounter: Payer: Self-pay | Admitting: Internal Medicine

## 2013-01-30 ENCOUNTER — Ambulatory Visit (INDEPENDENT_AMBULATORY_CARE_PROVIDER_SITE_OTHER): Payer: BC Managed Care – PPO | Admitting: Internal Medicine

## 2013-01-30 VITALS — BP 142/88 | HR 75 | Temp 98.4°F | Resp 14 | Ht 72.0 in | Wt 236.0 lb

## 2013-01-30 DIAGNOSIS — Z2911 Encounter for prophylactic immunotherapy for respiratory syncytial virus (RSV): Secondary | ICD-10-CM

## 2013-01-30 DIAGNOSIS — K760 Fatty (change of) liver, not elsewhere classified: Secondary | ICD-10-CM

## 2013-01-30 DIAGNOSIS — K7689 Other specified diseases of liver: Secondary | ICD-10-CM

## 2013-01-30 DIAGNOSIS — E785 Hyperlipidemia, unspecified: Secondary | ICD-10-CM

## 2013-01-30 DIAGNOSIS — E119 Type 2 diabetes mellitus without complications: Secondary | ICD-10-CM

## 2013-01-30 DIAGNOSIS — E669 Obesity, unspecified: Secondary | ICD-10-CM

## 2013-01-30 DIAGNOSIS — E1169 Type 2 diabetes mellitus with other specified complication: Secondary | ICD-10-CM

## 2013-01-30 MED ORDER — HYDROCODONE-ACETAMINOPHEN 7.5-325 MG PO TABS: ORAL_TABLET | ORAL | Status: DC

## 2013-01-30 NOTE — Patient Instructions (Signed)
Check your blood sugars once daily.   Choose from the following times:   Fasting   2 hr post prandial   Record in this book.  Jot down what you ate before  Send me the blood sugars via MyChart every 2 weeks   This is  One version of a  "Low GI"  Diet:  It will still lower your blood sugars and allow you to lose 4 to 8  lbs  per month if you follow it carefully.  Your goal with exercise is a minimum of 30 minutes of aerobic exercise 5 days per week (Walking does not count once it becomes easy!)    All of the foods can be found at grocery stores and in bulk at [employer].  The Atkins protein bars and shakes are available in more varieties at Target, WalMart and [employer]'s Foods.     7 AM Breakfast:  Choose from the following:  Low carbohydrate Protein  Shakes (I recommend the EAS AdvantEdge "Carb Control" shakes  Or the low carb shakes by Atkins.    2.5 carbs   Arnold's "Sandwhich Thin"toasted  w/ peanut butter (no jelly: about 20 net carbs  "Bagel Thin" with cream cheese and salmon: about 20 carbs   a scrambled egg/bacon/cheese burrito made with Mission's "carb balance" whole wheat tortilla  (about 10 net carbs )   Avoid cereal and bananas, oatmeal and cream of wheat and grits. They are loaded with carbohydrates!   10 AM: high protein snack  Protein bar by Atkins (the snack size, under 200 cal, usually < 6 net carbs).    A stick of cheese:  Around 1 carb,  100 cal     Dannon Light n Fit [place] Yogurt  (80 cal, 8 carbs)  Other so called "protein bars" and Greek yogurts tend to be loaded with carbohydrates.  Remember, in food advertising, the word "energy" is synonymous for " carbohydrate."  Lunch:   A Sandwich using the bread choices listed, Can use any  Eggs,  lunchmeat, grilled meat or canned tuna), avocado, regular mayo/mustard  and cheese.  A Salad using blue cheese, ranch,  Goddess or vinagrette,  No croutons or "confetti" and no "candied nuts" but regular nuts OK.   No pretzels or  chips.  Pickles and miniature sweet peppers are a good low carb alternative that provide a "crunch"  The bread is the only source of carbohydrate in a sandwich and  can be decreased by trying some of these alternatives to traditional loaf bread  Joseph's makes a pita bread and a flat bread that are 50 cal and 4 net carbs available at BJs and WalMart.  This can be toasted to use with hummous as well  Toufayan makes a low carb flatbread that's 100 cal and 9 net carbs available at [employer] and [employer] makes 2 sizes of  Low carb whole wheat tortilla  (The large one is 210 cal and 6 net carbs) Avoid "Low fat dressings, as well as Reyne Dumas and [address] dressings They are loaded with sugar!   3 PM/ Mid day  Snack:  Consider  1 ounce of  almonds, walnuts, pistachios, pecans, peanuts,  Macadamia nuts or a nut medley.  Avoid "granola"; the dried cranberries and raisins are loaded with carbohydrates. Mixed nuts as long as there are no raisins,  cranberries or dried fruit.     6 PM  Dinner:     Meat/fowl/fish with a green salad,  and either broccoli, cauliflower, green beans, spinach, brussel sprouts or  Lima beans. DO NOT BREAD THE PROTEIN!!      There is a low carb pasta by Dreamfield's that is acceptable and tastes great: only 5 digestible carbs/serving.( All grocery stores but BJs carry it )  Try Kai Levins Angelo's chicken piccata or chicken or eggplant parm over low carb pasta.(Lowes and BJs)   Clifton Custard Sanchez's "Carnitas" (pulled pork, no sauce,  0 carbs) or his beef pot roast to make a dinner burrito (at BJ's)  Pesto over low carb pasta (bj's sells a good quality pesto in the center refrigerated section of the deli   Whole wheat pasta is still full of digestible carbs and  Not as low in glycemic index as Dreamfield's.   Brown rice is still rice,  So skip the rice and noodles if you eat [place] or [name] (or at least limit to 1/2 cup)  9 PM snack :   Breyer's "low carb" fudgsicle or  ice  cream bar (Carb Smart line), or  Weight Watcher's ice cream bar , or another "no sugar added" ice cream;  a serving of fresh berries/cherries with whipped cream   Cheese or DANNON'S LlGHT N FIT [place] YOGURT  Avoid bananas, pineapple, grapes  and watermelon on a regular basis because they are high in sugar.  THINK OF THEM AS DESSERT  Remember that snack Substitutions should be less than 10 NET carbs per serving and meals < 20 carbs. Remember to subtract fiber grams to get the "net carbs."

## 2013-02-01 ENCOUNTER — Other Ambulatory Visit: Payer: Self-pay | Admitting: Internal Medicine

## 2013-02-01 ENCOUNTER — Encounter: Payer: Self-pay | Admitting: Internal Medicine

## 2013-02-01 NOTE — Progress Notes (Signed)
Patient ID: [name], male   DOB: 1965-07-07, 47 y.o.   MRN: [id number]   Patient Active Problem List   Diagnosis Date Noted  . Diabetes mellitus type 2 in obese 10/23/2012  . Seborrheic dermatitis, unspecified 05/17/2012  . Elevated glucose 04/22/2012  . Fatty liver 04/22/2012  . Polyarthralgia 12/24/2010  . Sciatica of right side 12/24/2010  . HYPERLIPIDEMIA 01/13/2009  . ANXIETY DEPRESSION 01/13/2009  . HYPERTENSION 01/13/2009  . OSA (obstructive sleep apnea) 01/13/2009    Subjective:  CC:   Chief Complaint  Patient presents with  . Follow-up    3 m0nth    HPI:   [name] a 47 y.o. male who presents for follow up on chronic issues including presumed fatty liver, hypothyroidism, obesity, and new diagnosis of diabetes mellitus type 2. hgba1c 7.8 .   After last visit in July, he was treated in ER the following day for persistent otitis and was treated for otitis externa as well as otitis media. The drainage has resolved and his hearing loss has resolved.  Patient did not return for the additional blood test that I have been trying to get since December until last week, has not lost any weight or changed his diet since July. His wife is on the heart transplant list  and the emotional and financial toll of her cardiomyopathy has been enormous on him. They have several children, one of which is still in high school.     Past Medical History  Diagnosis Date  . Hypertension     essential, benign  . Anxiety and depression   . Chest pain     hx  . Sleep apnea   . HLD (hyperlipidemia)   . Palpitation     hx  . Other chest pain     tightness, pressure  . Precordial pain   . Murmur   . Bruit     L  . Impotence of organic origin   . Edema   . Goiter     Past Surgical History  Procedure Laterality Date  . Knee arthroscopy    . Appendectomy  2002    done at [facility]  . Total knee arthroplasty      right    The following portions of the patient's history  were reviewed and updated as appropriate: Allergies, current medications, and problem list.    Review of Systems:  Patient denies headache, fevers, malaise, unintentional weight loss, skin rash, eye pain, sinus congestion and sinus pain, sore throat, dysphagia,  hemoptysis , cough, dyspnea, wheezing, chest pain, palpitations, orthopnea, edema, abdominal pain, nausea, melena, diarrhea, constipation, flank pain, dysuria, hematuria, urinary  Frequency, nocturia, numbness, tingling, seizures,  Focal weakness, Loss of consciousness,  Tremor, insomnia, depression, anxiety, and suicidal ideation.     History   Social History  . Marital Status: Married    Spouse Name: N/A    Number of Children: N/A  . Years of Education: N/A   Occupational History  . Maintenance supervisor    Social History Main Topics  . Smoking status: Former Smoker -- 2.00 packs/day for 18 years    Types: Cigarettes    Quit date: 04/24/2005  . Smokeless tobacco: Never Used     Comment: quit 20 years ago   . Alcohol Use: Yes     Comment: 1 a day  . Drug Use: No  . Sexual Activity: Not on file   Other Topics Concern  . Not on file   Social  History Narrative   Married, gets regular exercise.     Objective:  Filed Vitals:   01/30/13 0806  BP: 142/88  Pulse: 75  Temp: 98.4 F (36.9 C)  Resp: 14     General appearance: alert, cooperative and appears stated age Ears: normal TM's and external ear canals both ears Back: symmetric, no curvature. ROM normal. No CVA tenderness. Lungs: clear to auscultation bilaterally Heart: regular rate and rhythm, S1, S2 normal, no murmur, click, rub or gallop Abdomen: soft, non-tender; bowel sounds normal; no masses,  no organomegaly Pulses: 2+ and symmetric Skin: Skin color, texture, turgor normal. No rashes or lesions Lymph nodes: Cervical, supraclavicular, and axillary nodes normal. Foot exam:  Nails are well trimmed,  No callouses,  Sensation intact to  microfilament  Assessment and Plan:  Diabetes mellitus type 2 in obese I have addressed his Aqc of 7.8 , along with his  BMI and lipid panel ans recommended wt loss of 10% of body weigh over the next 6 months using a low glycemic index diet and regular exercise a minimum of 5 days per week. He was instructed on how to check his blood sugars, advised to check his blood sugars once daily at various times and keep a log. He will return in 3 months.   HYPERLIPIDEMIA Lab Results  Component Value Date   CHOL 228* 10/22/2012   HDL 37.00* 10/22/2012   LDLDIRECT 163.7 10/22/2012   TRIG 248.0* 10/22/2012   CHOLHDL 6 10/22/2012   New ACC guidelines recommend starting patients aged 76 or higher on moderate intensity statin therapy for history of diabetes and concurrent LDL between 70-189.  Discussed this with him today. He does not want to start statin therapy at this time. He would like to give a sincere effort to the low glycemic index diet and regular exercise to achieve weight loss and repeat his lipids in 3 months.. I have recommended that he get a diabetic eye exam today. I've also recommended that he start taking a baby aspirin daily. Foot Exam is normal.  Fatty liver  Based on abdominal ultrasound, new onset DM Type 2 and serologies negative for other causes of hepatitis.   Discussed  the long term consequences including cirrhosis,  as well as management with weight loss of 24 lbs (10%) and control of hyperlipidemia, and tentative diagosis of DM.  Low glycemic index advised and referral to Marlou Starks at [facility] was made for definitive diagnosis with liver biopsy     A total of 40 minutes was spent with patient more than half of which was spent in counseling, reviewing records and coordination of care.  Updated Medication List Outpatient Encounter Prescriptions as of 01/30/2013  Medication Sig Dispense Refill  . ALPRAZolam (XANAX) 0.5 MG tablet TAKE 1 TABLET BY MOUTH AT BEDTIME AS NEEDED  30 tablet  0  .  amoxicillin-clavulanate (AUGMENTIN) 875-125 MG per tablet Take 1 tablet by mouth every 12 (twelve) hours. Take four tablets one hour before dental work.      Marland Kitchen aspirin 81 MG EC tablet Take 81 mg by mouth daily.        Marland Kitchen buPROPion (WELLBUTRIN SR) 150 MG 12 hr tablet Take 150 mg by mouth 2 (two) times daily.      . citalopram (CELEXA) 20 MG tablet Take 20 mg by mouth daily.      Marland Kitchen HYDROcodone-acetaminophen (NORCO) 7.5-325 MG per tablet TAKE 1 TABLET BY MOUTH EVERY 8 HOURS AS NEEDED FOR PAIN  90 tablet  0  . levothyroxine (SYNTHROID, LEVOTHROID) 88 MCG tablet Take 88 mcg by mouth daily before breakfast.      . lisinopril-hydrochlorothiazide (PRINZIDE,ZESTORETIC) 20-25 MG per tablet Take 1 tablet by mouth daily.      . methocarbamol (ROBAXIN) 500 MG tablet TAKE 1 TABLET BY MOUTH EVERY 8 HOURS AS NEEDED  90 tablet  3  . metoprolol tartrate (LOPRESSOR) 25 MG tablet Take 25 mg by mouth 2 (two) times daily.      . Multiple Vitamin (MULTIVITAMIN) tablet Take 1 tablet by mouth daily.        Marland Kitchen omeprazole (PRILOSEC) 20 MG capsule Take 20 mg by mouth daily.      . traMADol (ULTRAM) 50 MG tablet Take 1 tablet (50 mg total) by mouth every 6 (six) hours as needed.  60 tablet  3  . vitamin C (ASCORBIC ACID) 500 MG tablet Take 500 mg by mouth daily.        . [DISCONTINUED] amoxicillin-clavulanate (AUGMENTIN) 875-125 MG per tablet Take 1 tablet by mouth every 12 (twelve) hours.  14 tablet  0  . [DISCONTINUED] HYDROcodone-acetaminophen (NORCO) 7.5-325 MG per tablet TAKE 1 TABLET BY MOUTH EVERY 8 HOURS AS NEEDED FOR PAIN  90 tablet  2  . [DISCONTINUED] citalopram (CELEXA) 20 MG tablet TAKE 1 TABLET (20 MG TOTAL) BY MOUTH DAILY.  30 tablet  2  . [DISCONTINUED] HYDROcodone-acetaminophen (VICODIN ES) 7.5-750 MG per tablet Take 1 tablet by mouth every 6 (six) hours as needed for pain.      . [DISCONTINUED] lisinopril-hydrochlorothiazide (PRINZIDE,ZESTORETIC) 20-25 MG per tablet TAKE 1 TABLET BY MOUTH DAILY.  30 tablet  5   . [DISCONTINUED] lisinopril-hydrochlorothiazide (PRINZIDE,ZESTORETIC) 20-25 MG per tablet TAKE 1 TABLET BY MOUTH DAILY.  30 tablet  3  . [DISCONTINUED] metoprolol tartrate (LOPRESSOR) 25 MG tablet TAKE 1 TABLET (25 MG TOTAL) BY MOUTH 2 (TWO) TIMES DAILY.  60 tablet  3  . [DISCONTINUED] ofloxacin (FLOXIN) 0.3 % otic solution Place 5 drops in ear(s) 2 (two) times daily.  5 mL  0  . [DISCONTINUED] omeprazole (PRILOSEC) 20 MG capsule TAKE 1 CAPSULE (20 MG TOTAL) BY MOUTH DAILY.  30 capsule  5   No facility-administered encounter medications on file as of 01/30/2013.     Orders Placed This Encounter  Procedures  . Flu Vaccine QUAD 36+ mos PF IM (Fluarix)  . Hemoglobin A1c  . Ambulatory referral to Ophthalmology    No Follow-up on file.

## 2013-02-01 NOTE — Assessment & Plan Note (Addendum)
Lab Results  Component Value Date   CHOL 228* 10/22/2012   HDL 37.00* 10/22/2012   LDLDIRECT 163.7 10/22/2012   TRIG 248.0* 10/22/2012   CHOLHDL 6 10/22/2012   New ACC guidelines recommend starting patients aged 48 or higher on moderate intensity statin therapy for history of diabetes and concurrent LDL between 70-189.  Discussed this with him today. He does not want to start statin therapy at this time. He would like to give a sincere effort to the low glycemic index diet and regular exercise to achieve weight loss and repeat his lipids in 3 months.. I have recommended that he get a diabetic eye exam today. I've also recommended that he start taking a baby aspirin daily. Foot Exam is normal.

## 2013-02-01 NOTE — Assessment & Plan Note (Signed)
I have addressed his Aqc of 7.8 , along with his  BMI and lipid panel ans recommended wt loss of 10% of body weigh over the next 6 months using a low glycemic index diet and regular exercise a minimum of 5 days per week. He was instructed on how to check his blood sugars, advised to check his blood sugars once daily at various times and keep a log. He will return in 3 months.

## 2013-02-01 NOTE — Assessment & Plan Note (Signed)
Based on abdominal ultrasound, new onset DM Type 2 and serologies negative for other causes of hepatitis.   Discussed  the long term consequences including cirrhosis,  as well as management with weight loss of 24 lbs (10%) and control of hyperlipidemia, and tentative diagosis of DM.  Low glycemic index advised and referral to [name] at [facility] was made for definitive diagnosis with liver biopsy

## 2013-02-03 ENCOUNTER — Other Ambulatory Visit: Payer: Self-pay | Admitting: Internal Medicine

## 2013-02-03 NOTE — Telephone Encounter (Signed)
Pt states he thought all of his meds were being electronically sent at his last visit 10/9.  States his pharmacy does not have any ready and that his pharmacy has sent [place] requests with no response

## 2013-02-04 MED ORDER — LISINOPRIL-HYDROCHLOROTHIAZIDE 20-25 MG PO TABS: 1.0000 | ORAL_TABLET | Freq: Every day | ORAL | Status: DC

## 2013-02-04 MED ORDER — METOPROLOL TARTRATE 25 MG PO TABS: 25.0000 mg | ORAL_TABLET | Freq: Two times a day (BID) | ORAL | Status: DC

## 2013-02-04 MED ORDER — CITALOPRAM HYDROBROMIDE 20 MG PO TABS: 20.0000 mg | ORAL_TABLET | Freq: Every day | ORAL | Status: DC

## 2013-02-04 MED ORDER — OMEPRAZOLE 20 MG PO CPDR: 20.0000 mg | DELAYED_RELEASE_CAPSULE | Freq: Every day | ORAL | Status: DC

## 2013-02-04 NOTE — Telephone Encounter (Signed)
Pt notified Rx's sent to pharmacy. 

## 2013-02-04 NOTE — Telephone Encounter (Signed)
Eprescribed.

## 2013-02-06 ENCOUNTER — Other Ambulatory Visit: Payer: Self-pay | Admitting: Internal Medicine

## 2013-02-06 MED ORDER — GLUCOSE BLOOD VI STRP: ORAL_STRIP | Status: DC

## 2013-02-06 MED ORDER — ONETOUCH ULTRASOFT LANCETS MISC: Status: DC

## 2013-02-06 NOTE — Telephone Encounter (Signed)
Asking if Contour Next  test strips and lancets can be sent to [facility] [address].  Also asking if this can have refills.  Pt would really like to keep a good check on his blood sugars and is making great efforts to improve.  Was told [facility].  Patient is completely out at this time and really needs these today.  Please contact pt to let him know this has been done.

## 2013-02-18 ENCOUNTER — Encounter: Payer: Self-pay | Admitting: Internal Medicine

## 2013-03-12 ENCOUNTER — Encounter: Payer: Self-pay | Admitting: Adult Health

## 2013-03-12 ENCOUNTER — Telehealth: Payer: Self-pay | Admitting: *Deleted

## 2013-03-12 ENCOUNTER — Ambulatory Visit (INDEPENDENT_AMBULATORY_CARE_PROVIDER_SITE_OTHER): Payer: BC Managed Care – PPO | Admitting: Adult Health

## 2013-03-12 ENCOUNTER — Encounter: Payer: Self-pay | Admitting: *Deleted

## 2013-03-12 VITALS — BP 132/80 | Temp 98.3°F | Resp 14 | Wt 227.0 lb

## 2013-03-12 DIAGNOSIS — K429 Umbilical hernia without obstruction or gangrene: Secondary | ICD-10-CM | POA: Insufficient documentation

## 2013-03-12 MED ORDER — GLUCOSE BLOOD VI STRP: ORAL_STRIP | Status: DC

## 2013-03-12 NOTE — Telephone Encounter (Signed)
Patient Information:  Caller Name: Jadarrius  Phone: [phone]  Patient: [name], [name]  Gender: Male  DOB: Sep 18, 1965  Age: 47 Years  PCP: Duncan Dull (Adults only)  Office Follow Up:  Does the office need to follow up with this patient?: No  Instructions For The Office: N/A   Symptoms  Reason For Call & Symptoms: Constant pain at small area in abdomen while sitting and pain in navel area if pressure applied today.-03/12/13. He has dime sized area of protrusion at [employer] for past 6 months but not hurting until today. Recently changed diet and lost 12 pounds in past 1 1/2 months. He is walking several times per week for exercise, denies any strenuous activity or lifting. Pain level 6-10.   Reviewed Health History In EMR: Yes  Reviewed Medications In EMR: Yes  Reviewed Allergies In EMR: Yes  Reviewed Surgeries / Procedures: Yes  Date of Onset of Symptoms: 03/12/2013  Guideline(s) Used:  Abdominal Pain - Male  Disposition Per Guideline:   Go to ED Now (or to Office with PCP Approval)  Reason For Disposition Reached:   Constant abdominal pain lasting > 2 hours  Advice Given:  Rest:  Lie down and rest until you feel better.  Expected Course:  With harmless causes, the pain is usually better or goes away within 2 hours. With viral gastroenteritis ("stomach flu"), belly cramps may precede each bout of vomiting or diarrhea and may last 2-3 days. With serious causes (such as appendicitis) the pain becomes constant and more severe.  Call Back If:  Abdominal pain is constant and present for more than 2 hours  Abdominal pains come and go and are present for more than 24 hours  You become worse.  Patient Will Follow Care Advice:  YES  Appointment Scheduled:  03/12/2013 14:45:00 Appointment Scheduled Provider:  Orville Govern

## 2013-03-12 NOTE — Progress Notes (Signed)
  Subjective:    Patient ID: [name], male    DOB: March 04, 1966, 47 y.o.   MRN: [id number]  HPI  Patient is a pleasant 47 year old male who presents to clinic with complaints of abdominal pain that began this morning. Patient had noticed an area right above his umbilicus approximately 6 months ago. It was not painful prior to today. He denies constipation, melena, nausea/ vomiting, fever.   Current Outpatient Prescriptions on File Prior to Visit  Medication Sig Dispense Refill  . ALPRAZolam (XANAX) 0.5 MG tablet TAKE 1 TABLET BY MOUTH AT BEDTIME AS NEEDED  30 tablet  0  . amoxicillin-clavulanate (AUGMENTIN) 875-125 MG per tablet Take 1 tablet by mouth every 12 (twelve) hours. Take four tablets one hour before dental work.      Marland Kitchen aspirin 81 MG EC tablet Take 81 mg by mouth daily.        Marland Kitchen buPROPion (WELLBUTRIN SR) 150 MG 12 hr tablet Take 150 mg by mouth 2 (two) times daily.      Marland Kitchen buPROPion (WELLBUTRIN SR) 150 MG 12 hr tablet TAKE 1 TABLET BY MOUTH TWICE A DAY  60 tablet  5  . citalopram (CELEXA) 20 MG tablet Take 1 tablet (20 mg total) by mouth daily.  30 tablet  2  . HYDROcodone-acetaminophen (NORCO) 7.5-325 MG per tablet TAKE 1 TABLET BY MOUTH EVERY 8 HOURS AS NEEDED FOR PAIN  90 tablet  0  . Lancets (ONETOUCH ULTRASOFT) lancets Use as instructed  100 each  3  . levothyroxine (SYNTHROID, LEVOTHROID) 88 MCG tablet Take 88 mcg by mouth daily before breakfast.      . levothyroxine (SYNTHROID, LEVOTHROID) 88 MCG tablet TAKE 1 TABLET (88 MCG TOTAL) BY MOUTH DAILY.  30 tablet  5  . lisinopril-hydrochlorothiazide (PRINZIDE,ZESTORETIC) 20-25 MG per tablet Take 1 tablet by mouth daily.  30 tablet  5  . methocarbamol (ROBAXIN) 500 MG tablet TAKE 1 TABLET BY MOUTH EVERY 8 HOURS AS NEEDED  90 tablet  3  . metoprolol tartrate (LOPRESSOR) 25 MG tablet Take 1 tablet (25 mg total) by mouth 2 (two) times daily.  60 tablet  5  . Multiple Vitamin (MULTIVITAMIN) tablet Take 1 tablet by mouth daily.         Marland Kitchen omeprazole (PRILOSEC) 20 MG capsule Take 1 capsule (20 mg total) by mouth daily.  30 capsule  5  . traMADol (ULTRAM) 50 MG tablet Take 1 tablet (50 mg total) by mouth every 6 (six) hours as needed.  60 tablet  3  . vitamin C (ASCORBIC ACID) 500 MG tablet Take 500 mg by mouth daily.         No current facility-administered medications on file prior to visit.     Review of Systems  Gastrointestinal: Positive for abdominal pain. Negative for nausea, vomiting, constipation, blood in stool and anal bleeding.       Right above umbilicus       Objective:   Physical Exam  Constitutional: He is oriented to person, place, and time.  Pulmonary/Chest: Effort normal. No respiratory distress.  Abdominal: Soft. Bowel sounds are normal. There is no rebound and no guarding.  Umbilical hernia. No discoloration noted.  Neurological: He is alert and oriented to person, place, and time.  Skin: Skin is warm and dry.  Psychiatric: He has a normal mood and affect. His behavior is normal.          Assessment & Plan:

## 2013-03-12 NOTE — Telephone Encounter (Signed)
Pt coming in today @ 2:45 to see [name]

## 2013-03-12 NOTE — Assessment & Plan Note (Signed)
First noted 6 months ago. Pain started today. Refer to surgery for further evaluation and recommendations.

## 2013-03-12 NOTE — Telephone Encounter (Signed)
Patient ask for sig to read on test strips to check CBG two times daily so insurance will pay. I corrected this and sent to new script to patient pharmacy. FYI

## 2013-04-06 ENCOUNTER — Emergency Department: Payer: Self-pay | Admitting: Emergency Medicine

## 2013-04-06 LAB — CBC WITH DIFFERENTIAL/PLATELET
HCT: 44.1 % (ref 40.0–52.0)
HGB: 15 g/dL (ref 13.0–18.0)
Lymphocyte #: 0.7 10*3/uL — ABNORMAL LOW (ref 1.0–3.6)
Lymphocyte %: 16.9 %
MCH: 31.7 pg (ref 26.0–34.0)
MCHC: 34.1 g/dL (ref 32.0–36.0)
MCV: 93 fL (ref 80–100)
Monocyte %: 8.9 %
Neutrophil #: 3 10*3/uL (ref 1.4–6.5)
Neutrophil %: 70.4 %
Platelet: 299 10*3/uL (ref 150–440)

## 2013-04-06 LAB — URINALYSIS, COMPLETE
Bacteria: NONE SEEN
Bilirubin,UR: NEGATIVE
Glucose,UR: 50 mg/dL (ref 0–75)
Ketone: NEGATIVE
Leukocyte Esterase: NEGATIVE
Nitrite: NEGATIVE
Ph: 5 (ref 4.5–8.0)
Protein: NEGATIVE
Squamous Epithelial: NONE SEEN
WBC UR: NONE SEEN /HPF (ref 0–5)

## 2013-04-06 LAB — ETHANOL
Ethanol %: 0.077 % (ref 0.000–0.080)
Ethanol: 77 mg/dL

## 2013-04-06 LAB — COMPREHENSIVE METABOLIC PANEL
Alkaline Phosphatase: 111 U/L
Anion Gap: 5 — ABNORMAL LOW (ref 7–16)
BUN: 11 mg/dL (ref 7–18)
Chloride: 105 mmol/L (ref 98–107)
Co2: 29 mmol/L (ref 21–32)
Creatinine: 0.86 mg/dL (ref 0.60–1.30)
EGFR (African American): >60
EGFR (Non-African Amer.): >60
Glucose: 163 mg/dL — ABNORMAL HIGH (ref 65–99)
Osmolality: 281 (ref 275–301)
Potassium: 3.9 mmol/L (ref 3.5–5.1)
SGOT(AST): 60 U/L — ABNORMAL HIGH (ref 15–37)
Total Protein: 7.7 g/dL (ref 6.4–8.2)

## 2013-04-06 LAB — LIPASE, BLOOD: Lipase: 125 U/L (ref 73–393)

## 2013-04-09 ENCOUNTER — Ambulatory Visit: Payer: BC Managed Care – PPO | Admitting: Pulmonary Disease

## 2013-04-10 ENCOUNTER — Encounter (HOSPITAL_COMMUNITY): Payer: Self-pay | Admitting: Emergency Medicine

## 2013-04-10 ENCOUNTER — Telehealth (HOSPITAL_COMMUNITY): Payer: Self-pay | Admitting: *Deleted

## 2013-04-10 ENCOUNTER — Emergency Department (HOSPITAL_COMMUNITY)
Admission: EM | Admit: 2013-04-10 | Discharge: 2013-04-10 | Disposition: A | Discharge status: Home / Self Care | Payer: BC Managed Care – PPO | Attending: Emergency Medicine | Admitting: Emergency Medicine

## 2013-04-10 ENCOUNTER — Emergency Department (HOSPITAL_COMMUNITY): Payer: BC Managed Care – PPO

## 2013-04-10 DIAGNOSIS — Z7982 Long term (current) use of aspirin: Secondary | ICD-10-CM | POA: Insufficient documentation

## 2013-04-10 DIAGNOSIS — E785 Hyperlipidemia, unspecified: Secondary | ICD-10-CM | POA: Insufficient documentation

## 2013-04-10 DIAGNOSIS — R1084 Generalized abdominal pain: Secondary | ICD-10-CM | POA: Insufficient documentation

## 2013-04-10 DIAGNOSIS — Z79899 Other long term (current) drug therapy: Secondary | ICD-10-CM | POA: Insufficient documentation

## 2013-04-10 DIAGNOSIS — R079 Chest pain, unspecified: Secondary | ICD-10-CM | POA: Insufficient documentation

## 2013-04-10 DIAGNOSIS — Z87448 Personal history of other diseases of urinary system: Secondary | ICD-10-CM | POA: Insufficient documentation

## 2013-04-10 DIAGNOSIS — Z8639 Personal history of other endocrine, nutritional and metabolic disease: Secondary | ICD-10-CM | POA: Insufficient documentation

## 2013-04-10 DIAGNOSIS — F411 Generalized anxiety disorder: Secondary | ICD-10-CM | POA: Insufficient documentation

## 2013-04-10 DIAGNOSIS — R011 Cardiac murmur, unspecified: Secondary | ICD-10-CM | POA: Insufficient documentation

## 2013-04-10 DIAGNOSIS — Z9089 Acquired absence of other organs: Secondary | ICD-10-CM | POA: Insufficient documentation

## 2013-04-10 DIAGNOSIS — K5900 Constipation, unspecified: Secondary | ICD-10-CM | POA: Insufficient documentation

## 2013-04-10 DIAGNOSIS — R110 Nausea: Secondary | ICD-10-CM | POA: Insufficient documentation

## 2013-04-10 DIAGNOSIS — I10 Essential (primary) hypertension: Secondary | ICD-10-CM | POA: Insufficient documentation

## 2013-04-10 DIAGNOSIS — F3289 Other specified depressive episodes: Secondary | ICD-10-CM | POA: Insufficient documentation

## 2013-04-10 DIAGNOSIS — Z87891 Personal history of nicotine dependence: Secondary | ICD-10-CM | POA: Insufficient documentation

## 2013-04-10 DIAGNOSIS — M549 Dorsalgia, unspecified: Secondary | ICD-10-CM | POA: Insufficient documentation

## 2013-04-10 DIAGNOSIS — Z862 Personal history of diseases of the blood and blood-forming organs and certain disorders involving the immune mechanism: Secondary | ICD-10-CM | POA: Insufficient documentation

## 2013-04-10 DIAGNOSIS — F329 Major depressive disorder, single episode, unspecified: Secondary | ICD-10-CM | POA: Insufficient documentation

## 2013-04-10 DIAGNOSIS — R109 Unspecified abdominal pain: Secondary | ICD-10-CM

## 2013-04-10 LAB — URINALYSIS, ROUTINE W REFLEX MICROSCOPIC
Bilirubin Urine: NEGATIVE
Hgb urine dipstick: NEGATIVE
Ketones, ur: 15 mg/dL — AB
Nitrite: NEGATIVE
Protein, ur: NEGATIVE mg/dL
Specific Gravity, Urine: 1.01 (ref 1.005–1.030)
Urobilinogen, UA: 0.2 mg/dL (ref 0.0–1.0)
pH: 7 (ref 5.0–8.0)

## 2013-04-10 LAB — HEPATIC FUNCTION PANEL
ALT: 39 U/L (ref 0–53)
AST: 27 U/L (ref 0–37)
Alkaline Phosphatase: 97 U/L (ref 39–117)
Bilirubin, Direct: 0.1 mg/dL (ref 0.0–0.3)
Indirect Bilirubin: 0.4 mg/dL (ref 0.3–0.9)
Total Bilirubin: 0.5 mg/dL (ref 0.3–1.2)

## 2013-04-10 LAB — POCT I-STAT TROPONIN I

## 2013-04-10 LAB — CBC
HCT: 42.8 % (ref 39.0–52.0)
MCHC: 34.6 g/dL (ref 30.0–36.0)
MCV: 92.8 fL (ref 78.0–100.0)
RDW: 12.3 % (ref 11.5–15.5)

## 2013-04-10 LAB — BASIC METABOLIC PANEL
BUN: 14 mg/dL (ref 6–23)
Creatinine, Ser: 1.05 mg/dL (ref 0.50–1.35)
GFR calc Af Amer: >90 mL/min (ref >90)
GFR calc non Af Amer: 82 mL/min — ABNORMAL LOW (ref >90)
Potassium: 4.4 mEq/L (ref 3.5–5.1)

## 2013-04-10 MED ORDER — FENTANYL CITRATE 0.05 MG/ML IJ SOLN
50.0000 ug | Freq: Once | INTRAMUSCULAR | Status: AC
  Administered 2013-04-10: 50 ug via INTRAVENOUS
  Filled 2013-04-10: qty 2

## 2013-04-10 MED ORDER — POLYETHYLENE GLYCOL 3350 17 G PO PACK: 17.0000 g | PACK | Freq: Two times a day (BID) | ORAL | Status: DC

## 2013-04-10 MED ORDER — ONDANSETRON HCL 4 MG/2ML IJ SOLN
4.0000 mg | Freq: Once | INTRAMUSCULAR | Status: AC
  Administered 2013-04-10: 4 mg via INTRAVENOUS
  Filled 2013-04-10: qty 2

## 2013-04-10 MED ORDER — ONDANSETRON HCL 4 MG/2ML IJ SOLN: 4.0000 mg | Freq: Once | INTRAMUSCULAR | Status: DC

## 2013-04-10 MED ORDER — HYDROMORPHONE HCL PF 1 MG/ML IJ SOLN: 1.0000 mg | Freq: Once | INTRAMUSCULAR | Status: DC

## 2013-04-10 MED ORDER — DICYCLOMINE HCL 20 MG PO TABS: 20.0000 mg | ORAL_TABLET | Freq: Two times a day (BID) | ORAL | Status: DC

## 2013-04-10 NOTE — ED Notes (Signed)
Patient unable to void at this time

## 2013-04-10 NOTE — ED Provider Notes (Signed)
CSN: [id number]     Arrival date & time 04/10/13  1155 History   First MD Initiated Contact with Patient 04/10/13 1509     Chief Complaint  Patient presents with  . Chest Pain  . Abdominal Pain   (Consider location/radiation/quality/duration/timing/severity/associated sxs/prior Treatment) HPI Comments: 47 year old male presents with 6 days of epigastric abdominal pain and has now become diffuse and occasionally radiates up his chest and back. Is also not had a bowel movement during this time. He denies any bloody or black stools before this. He said a little bit of nausea but no vomiting. He has double his Prilosec to 40 mg but has not had any help. Denies any fevers or chills. Denies ever having abdominal surgery. He's been to 2 hospitals during this time, to [facility] hospital and [place] get a CT scan and was supposedly negative. Yesterday he went to [facility] and had an ultrasound was negative. He feels like his abdomen is bloating during this time. He's tried "a stool softener" has not had any stools since then.   Past Medical History  Diagnosis Date  . Hypertension     essential, benign  . Anxiety and depression   . Chest pain     hx  . Sleep apnea   . HLD (hyperlipidemia)   . Palpitation     hx  . Other chest pain     tightness, pressure  . Precordial pain   . Murmur   . Bruit     L  . Impotence of organic origin   . Edema   . Goiter    Past Surgical History  Procedure Laterality Date  . Knee arthroscopy    . Appendectomy  2002    done at [facility]  . Total knee arthroplasty      right   Family History  Problem Relation Age of Onset  . Heart disease Father    History  Substance Use Topics  . Smoking status: Former Smoker -- 2.00 packs/day for 18 years    Types: Cigarettes    Quit date: 04/24/2005  . Smokeless tobacco: Never Used     Comment: quit 20 years ago   . Alcohol Use: Yes     Comment: 1 a day    Review of Systems  Constitutional: Negative for fever and  chills.  Respiratory: Negative for shortness of breath.   Cardiovascular: Positive for chest pain.  Gastrointestinal: Positive for nausea, abdominal pain and constipation. Negative for vomiting, diarrhea and blood in stool.  Genitourinary: Negative for dysuria.  Musculoskeletal: Positive for back pain.  All other systems reviewed and are negative.    Allergies  Review of patient's allergies indicates no known allergies.  Home Medications   Current Outpatient Rx  Name  Route  Sig  Dispense  Refill  . ALPRAZolam (XANAX) 0.5 MG tablet   Oral   Take 0.5 mg by mouth at bedtime as needed for sleep.         Marland Kitchen aspirin 81 MG EC tablet   Oral   Take 81 mg by mouth daily.           Marland Kitchen buPROPion (WELLBUTRIN SR) 150 MG 12 hr tablet   Oral   Take 150 mg by mouth 2 (two) times daily.         . citalopram (CELEXA) 20 MG tablet   Oral   Take 1 tablet (20 mg total) by mouth daily.   30 tablet   2   . HYDROcodone-acetaminophen (  NORCO/VICODIN) 5-325 MG per tablet   Oral   Take 1 tablet by mouth every morning.         Marland Kitchen levothyroxine (SYNTHROID, LEVOTHROID) 88 MCG tablet   Oral   Take 88 mcg by mouth daily before breakfast.         . lisinopril-hydrochlorothiazide (PRINZIDE,ZESTORETIC) 20-25 MG per tablet   Oral   Take 1 tablet by mouth daily.   30 tablet   5   . methocarbamol (ROBAXIN) 500 MG tablet   Oral   Take 500 mg by mouth every 8 (eight) hours as needed for muscle spasms.         . metoprolol tartrate (LOPRESSOR) 25 MG tablet   Oral   Take 1 tablet (25 mg total) by mouth 2 (two) times daily.   60 tablet   5   . Multiple Vitamin (MULTIVITAMIN) tablet   Oral   Take 1 tablet by mouth daily.           Marland Kitchen omeprazole (PRILOSEC) 40 MG capsule   Oral   Take 40 mg by mouth daily.         Bertram Gala Glycol-Propyl Glycol (SYSTANE OP)   Both Eyes   Place 1 drop into both eyes daily as needed (dryness).         . traMADol (ULTRAM) 50 MG tablet   Oral    Take 50 mg by mouth every 6 (six) hours as needed for moderate pain.         . vitamin C (ASCORBIC ACID) 500 MG tablet   Oral   Take 500 mg by mouth daily.            BP 161/87  Pulse 59  Temp(Src) 97.7 F (36.5 C) (Oral)  Resp 13  Ht 6' (1.829 m)  Wt 222 lb (100.699 kg)  BMI 30.10 kg/m2  SpO2 96% Physical Exam  Nursing note and vitals reviewed. Constitutional: He is oriented to person, place, and time. He appears well-developed and well-nourished.  HENT:  Head: Normocephalic and atraumatic.  Right Ear: External ear normal.  Left Ear: External ear normal.  Nose: Nose normal.  Eyes: Right eye exhibits no discharge. Left eye exhibits no discharge.  Neck: Neck supple.  Cardiovascular: Normal rate, regular rhythm, normal heart sounds and intact distal pulses.   Pulmonary/Chest: Effort normal and breath sounds normal. He has no wheezes. He has no rales. He exhibits no tenderness.  Abdominal: Soft. There is tenderness (mild).  Genitourinary: Prostate normal. Rectal exam shows no external hemorrhoid, no internal hemorrhoid, no mass and no tenderness.  No stool appreciated  Musculoskeletal: He exhibits no edema.  Neurological: He is alert and oriented to person, place, and time.  Skin: Skin is warm and dry.    ED Course  Procedures (including critical care time) Labs Review Labs Reviewed  BASIC METABOLIC PANEL - Abnormal; Notable for the following:    Glucose, Bld 112 (*)    GFR calc non Af Amer 82 (*)    All other components within normal limits  URINALYSIS, ROUTINE W REFLEX MICROSCOPIC - Abnormal; Notable for the following:    Ketones, ur 15 (*)    All other components within normal limits  CBC  HEPATIC FUNCTION PANEL  LIPASE, BLOOD  GLUCOSE, CAPILLARY  POCT I-STAT TROPONIN I   Imaging Review Dg Chest 2 View  04/10/2013   CLINICAL DATA:  Chest pain.  EXAM: CHEST  2 VIEW  COMPARISON:  None.  FINDINGS: The heart  size and mediastinal contours are within normal  limits. Both lungs are clear. No pleural effusion or pneumothorax is noted. The visualized skeletal structures are unremarkable.  IMPRESSION: No acute cardiopulmonary abnormality seen.   Electronically Signed   By: Roque Lias M.D.   On: 04/10/2013 13:23   Dg Abd Acute W/chest  04/10/2013   CLINICAL DATA:  Abdominal pain and constipation  EXAM: ACUTE ABDOMEN SERIES (ABDOMEN 2 VIEW & CHEST 1 VIEW)  COMPARISON:  Chest radiograph April 10, 2013  FINDINGS: PA chest: Lungs are clear. Heart size and pulmonary vascularity are normal. No adenopathy.  Supine and upright abdomen: There is stool throughout the colon. There is a relative paucity of gas. No obstruction or free air seen. No abnormal calcifications.  IMPRESSION: Paucity of gas. While this finding may be seen normally, it also may be seen as a function of enteritis or early ileus. Obstruction is not felt to be present. No edema or consolidation.   Electronically Signed   By: Bretta Bang M.D.   On: 04/10/2013 16:30    EKG Interpretation    Date/Time:  Thursday April 10 2013 11:59:16 EST Ventricular Rate:  66 PR Interval:  148 QRS Duration: 104 QT Interval:  424 QTC Calculation: 444 R Axis:   -96 Text Interpretation:  Normal sinus rhythm Right superior axis deviation Nonspecific T wave abnormality Confirmed by Clester Chlebowski  MD, Lilley Hubble ([id number]) on 04/10/2013 3:10:52 PM            MDM   1. Abdominal pain   2. Constipation    His abd exam is nonspecific, mild tenderness. He likely has some gastritis sx but has unchanged Hgb over 1 week per old records I reviewed. Has moderate stool on AAS, no obvious SBO. Possible early ileus. No fecal impaction on my exam. Has only tried stool softeners. By reviewed [facility] and [place] records, he has normal CT and U/S, I feel that constipation is likely the cause of his pain and distention. Recommend enemas and miralax, and encouraged PCP f/u. Wife requesting GI f/u.     Audree Camel,  MD 04/11/13 [phone]

## 2013-04-10 NOTE — ED Notes (Addendum)
Pt reports going to 3 hospitals for same. Having chest pain, upper and lower abd. Has been told its possibly an ulcer, having nausea, constipation and headache. ekg done at triage, no acute distress noted at this time.

## 2013-04-10 NOTE — ED Notes (Signed)
Pt wanting his work note faxed to his work office at [phone], Shelda Jakes.

## 2013-04-14 ENCOUNTER — Telehealth (HOSPITAL_COMMUNITY): Payer: Self-pay | Admitting: *Deleted

## 2013-04-14 DIAGNOSIS — R109 Unspecified abdominal pain: Secondary | ICD-10-CM

## 2013-04-14 NOTE — Telephone Encounter (Signed)
Per Dr Gala Romney pt needs referral to GI ASAP for continuous severe abd pain, Pt sch for appt tomorrow at 10 with Mike Gip, PA pt aware

## 2013-04-15 ENCOUNTER — Encounter: Payer: Self-pay | Admitting: General Surgery

## 2013-04-15 ENCOUNTER — Ambulatory Visit (INDEPENDENT_AMBULATORY_CARE_PROVIDER_SITE_OTHER): Payer: BC Managed Care – PPO | Admitting: Physician Assistant

## 2013-04-15 ENCOUNTER — Ambulatory Visit (INDEPENDENT_AMBULATORY_CARE_PROVIDER_SITE_OTHER): Payer: BC Managed Care – PPO | Admitting: General Surgery

## 2013-04-15 ENCOUNTER — Encounter: Payer: Self-pay | Admitting: Physician Assistant

## 2013-04-15 ENCOUNTER — Encounter: Payer: Self-pay | Admitting: Internal Medicine

## 2013-04-15 VITALS — BP 112/68 | HR 60 | Ht 72.0 in | Wt 225.6 lb

## 2013-04-15 VITALS — BP 164/84 | HR 74 | Resp 12 | Ht 72.0 in | Wt 223.0 lb

## 2013-04-15 DIAGNOSIS — R1013 Epigastric pain: Secondary | ICD-10-CM

## 2013-04-15 DIAGNOSIS — K429 Umbilical hernia without obstruction or gangrene: Secondary | ICD-10-CM

## 2013-04-15 DIAGNOSIS — R110 Nausea: Secondary | ICD-10-CM

## 2013-04-15 MED ORDER — HYDROCODONE-ACETAMINOPHEN 5-325 MG PO TABS: ORAL_TABLET | ORAL | Status: DC

## 2013-04-15 MED ORDER — OMEPRAZOLE 40 MG PO CPDR: DELAYED_RELEASE_CAPSULE | ORAL | Status: DC

## 2013-04-15 NOTE — Progress Notes (Signed)
Subjective:    Patient ID: [name], male    DOB: March 09, 1966, 47 y.o.   MRN: [id number]  HPI  [name] is a pleasant 47 year old white male new to GI today referred by primary care for evaluation of severe abdominal pain. Patient has history of hypertension ,hyperlipidemia, and anxiety as well as sleep apnea. He has diabetes maintained on oral agents and states that he has an umbilical hernia for which he is seeing a Careers adviser in [place] regarding Psychologist, forensic. He presents at this time with complaints of acute epigastric pain onset about 10 days ago. He stated this came on fairly abruptly and has been persistent. Patient states pain started in the epigastrium and then over the next few days seem to radiate into his lower abdomen. Since onset of his pain he has also been significantly constipated. He has not had any melena or hematochezia. He has had some nausea but no vomiting and his appetite has been decreased. Marland Kitchen He says at times it feels like the pain is going straight through into his back. He has been taking some MiraLax which has been helpful. He had been on a baby aspirin daily but no other regular aspirin or NSAIDs.  He had an ER visit in [place] on 04/10/2013 labs done there were unremarkable including hepatic panel CBC and be met. He had plain films done which were negative . He had had an ultrasound in April of 2014 which showed fatty liver and and unremarkable gallbladder. He has been on omeprazole 20 mg by mouth every morning long term for reflux symptoms that was increased to 40 mg over the past 10 days but has not noticed much difference.    Review of Systems  Constitutional: Positive for appetite change.  HENT: Negative.   Eyes: Negative.   Respiratory: Negative.   Cardiovascular: Negative.   Gastrointestinal: Positive for nausea and abdominal pain.  Endocrine: Negative.   Genitourinary: Negative.   Musculoskeletal: Negative.   Skin: Negative.   Allergic/Immunologic:  Negative.   Neurological: Negative.   Hematological: Negative.   Psychiatric/Behavioral: Negative.    Outpatient Prescriptions Prior to Visit  Medication Sig Dispense Refill  . ALPRAZolam (XANAX) 0.5 MG tablet Take 0.5 mg by mouth at bedtime as needed for sleep.      Marland Kitchen aspirin 81 MG EC tablet Take 81 mg by mouth daily.        Marland Kitchen buPROPion (WELLBUTRIN SR) 150 MG 12 hr tablet Take 150 mg by mouth 2 (two) times daily.      . citalopram (CELEXA) 20 MG tablet Take 1 tablet (20 mg total) by mouth daily.  30 tablet  2  . dicyclomine (BENTYL) 20 MG tablet Take 1 tablet (20 mg total) by mouth 2 (two) times daily.  20 tablet  0  . levothyroxine (SYNTHROID, LEVOTHROID) 88 MCG tablet Take 88 mcg by mouth daily before breakfast.      . lisinopril-hydrochlorothiazide (PRINZIDE,ZESTORETIC) 20-25 MG per tablet Take 1 tablet by mouth daily.  30 tablet  5  . methocarbamol (ROBAXIN) 500 MG tablet Take 500 mg by mouth every 8 (eight) hours as needed for muscle spasms.      . metoprolol tartrate (LOPRESSOR) 25 MG tablet Take 1 tablet (25 mg total) by mouth 2 (two) times daily.  60 tablet  5  . Multiple Vitamin (MULTIVITAMIN) tablet Take 1 tablet by mouth daily.        Bertram Gala Glycol-Propyl Glycol (SYSTANE OP) Place 1 drop into both eyes daily  as needed (dryness).      . polyethylene glycol (MIRALAX / GLYCOLAX) packet Take 17 g by mouth 2 (two) times daily.  14 each  0  . traMADol (ULTRAM) 50 MG tablet Take 50 mg by mouth every 6 (six) hours as needed for moderate pain.      . vitamin C (ASCORBIC ACID) 500 MG tablet Take 500 mg by mouth daily.        Marland Kitchen HYDROcodone-acetaminophen (NORCO/VICODIN) 5-325 MG per tablet Take 1 tablet by mouth every morning.      Marland Kitchen omeprazole (PRILOSEC) 40 MG capsule Take 40 mg by mouth daily.       No facility-administered medications prior to visit.   No Known Allergies Patient Active Problem List   Diagnosis Date Noted  . Umbilical hernia 03/12/2013  . Diabetes mellitus type 2  in obese 10/23/2012  . Seborrheic dermatitis, unspecified 05/17/2012  . Elevated glucose 04/22/2012  . Fatty liver 04/22/2012  . Polyarthralgia 12/24/2010  . Sciatica of right side 12/24/2010  . HYPERLIPIDEMIA 01/13/2009  . ANXIETY DEPRESSION 01/13/2009  . HYPERTENSION 01/13/2009  . OSA (obstructive sleep apnea) 01/13/2009   History  Substance Use Topics  . Smoking status: Former Smoker -- 2.00 packs/day for 18 years    Types: Cigarettes    Quit date: 04/24/2005  . Smokeless tobacco: Never Used     Comment: quit 20 years ago   . Alcohol Use: Yes     Comment: 1 a day   family history includes Heart disease in his father.     Objective:   Physical Exam  well-developed white male in no acute distress, pleasant blood pressure 112/68 pulse 60 height 6 foot weight 225. HEENT; nontraumatic normocephalic EOMI PERRLA sclera anicteric, Supple; no JVD, Cardiovascular; regular rate and rhythm with S1-S2 no murmur or gallop, Pulmonary; clear bilaterally, Abdomen; soft he has mild generalized tenderness but is more tender in the epigastrium and right upper quadrant there is no guarding or rebound no palpable mass or hepatosplenomegaly bowel sounds are present, Rectal; exam not done, Extremities ;no clubbing cyanosis or edema skin warm and dry, Psych; mood and affect appropriate        Assessment & Plan:    #61 47 year old male with 10 day history of epigastric and upper abdominal pain with nausea. Initial workup through the emergency room with plain films and labs both unremarkable. Thus far patient with minimal response to twice a day PPI. Upper abnormal ultrasound 6 months ago was unremarkable with the exception of fatty liver. Etiology of his current pain is not clear will rule out peptic ulcer disease, duodenitis gastritis versus other intra-abdominal inflammatory process #2 diabetes mellitus #3 umbilical hernia #4 sleep apnea #5 hypertension #6 hyperlipidemia  Plan; schedule for CT scan  of the abdomen and pelvis with contrast Increase omeprazole to 40 mg by mouth twice daily x2 weeks then back to once daily if better Continue MiraLax on a when necessary basis Avoid NSAIDs use Tylenol as needed for pain Schedule for upper endoscopy with Dr. Sandria Bales discussed in detail with the patient and he is agreeable to proceed Further plans pending results of above.

## 2013-04-15 NOTE — Patient Instructions (Addendum)
Patient to be scheduled for hernia repair.   Hernia, Surgical Repair A hernia occurs when an internal organ pushes out through a weak spot in the belly (abdominal) wall muscles. Hernias commonly occur in the groin and around the navel. Hernias often can be pushed back into place (reduced). Most hernias tend to get worse over time. Problems occur when abdominal contents get stuck in the opening (incarcerated hernia). The blood supply gets cut off (strangulated hernia). This is an emergency and needs surgery. Otherwise, hernia repair can be an elective procedure. This means you can schedule this at your convenience when an emergency is not present. Because complications can occur, if you decide to repair the hernia, it is best to do it soon. When it becomes an emergency procedure, there is increased risk of complications after surgery. CAUSES   Heavy lifting.  Obesity.  Prolonged coughing.  Straining to move your bowels.  Hernias can also occur through a cut (incision) by a surgeonafter an abdominal operation. HOME CARE INSTRUCTIONS Before the repair:  Bed rest is not required. You may continue your normal activities, but avoid heavy lifting (more than 10 pounds) or straining. Cough gently. If you are a smoker, it is best to stop. Even the best hernia repair can break down with the continual strain of coughing.  Do not wear anything tight over your hernia. Do not try to keep it in with an outside bandage or truss. These can damage abdominal contents if they are trapped in the hernia sac.  Eat a normal diet. Avoid constipation. Straining over long periods of time to have a bowel movement will increase hernia size. It also can breakdown repairs. If you cannot do this with diet alone, laxatives or stool softeners may be used. PRIOR TO SURGERY, SEEK IMMEDIATE MEDICAL CARE IF: You have problems (symptoms) of a trapped (incarcerated) hernia. Symptoms include:  An oral temperature above 102 F  (38.9 C) develops, or as your caregiver suggests.  Increasing abdominal pain.  Feeling sick to your stomach(nausea) and vomiting.  You stop passing gas or stool.  The hernia is stuck outside the abdomen, looks discolored, feels hard, or is tender.  You have any changes in your bowel habits or in the hernia that is unusual for you. LET YOUR CAREGIVERS KNOW ABOUT THE FOLLOWING:  Allergies.  Medications taken including herbs, eye drops, over the counter medications, and creams.  Use of steroids (by mouth or creams).  Family or personal history of problems with anesthetics or Novocaine.  Possibility of pregnancy, if this applies.  Personal history of blood clots (thrombophlebitis).  Family or personal history of bleeding or blood problems.  Previous surgery.  Other health problems. BEFORE THE PROCEDURE You should be present 1 hour prior to your procedure, or as directed by your caregiver.  AFTER THE PROCEDURE After surgery, you will be taken to the recovery area. A nurse will watch and check your progress there. Once you are awake, stable, and taking fluids well, you will be allowed to go home as long as there are no problems. Once home, an ice pack (wrapped in a light towel) applied to your operative site may help with discomfort. It may also keep the swelling down. Do not lift anything heavier than 10 pounds (4.55 kilograms). Take showers not baths. Do not drive while taking narcotics. Follow instructions as suggested by your caregiver.  SEEK IMMEDIATE MEDICAL CARE IF: After surgery:  There is redness, swelling, or increasing pain in the wound.  There  is pus coming from the wound.  There is drainage from a wound lasting longer than 1 day.  An unexplained oral temperature above 102 F (38.9 C) develops.  You notice a foul smell coming from the wound or dressing.  There is a breaking open of a wound (edged not staying together) after the sutures have been removed.  You  notice increasing pain in the shoulders (shoulder strap areas).  You develop dizzy episodes or fainting while standing.  You develop persistent nausea or vomiting.  You develop a rash.  You have difficulty breathing.  You develop any reaction or side effects to medications given. MAKE SURE YOU:   Understand these instructions.  Will watch your condition.  Will get help right away if you are not doing well or get worse. Document Released: 10/04/2000 Document Revised: 07/03/2011 Document Reviewed: 08/27/2007 [facility] Patient Information 2014 [place], [place].  Patient's surgery has been scheduled for 05-15-13 at [facility]. It is okay for patient to continue 81 mg aspirin.

## 2013-04-15 NOTE — Patient Instructions (Addendum)
You have been scheduled for an endoscopy with propofol. Please follow written instructions given to you at your visit today. If you use inhalers (even only as needed), please bring them with you on the day of your procedure. Your physician has requested that you go to www.startemmi.com and enter the access code given to you at your visit today. This web site gives a general overview about your procedure. However, you should still follow specific instructions given to you by our office regarding your preparation for the procedure.   We sent a prescription for Omeprazole 40 mg twice daily for one month. Call us a week before you are out and we will send a prescirpion for one daily.     You have been scheduled for a CT scan of the abdomen and pelvis at [facility] ([address]---this is in the same building as Architectural technologist).   You are scheduled on 04-21-2013 at 4:00 PM  You should arrive at 3:45 PM   prior to your appointment time for registration. Please follow the written instructions below on the day of your exam:  WARNING: IF YOU ARE ALLERGIC TO IODINE/X-RAY DYE, PLEASE NOTIFY RADIOLOGY IMMEDIATELY AT [phone]! YOU WILL BE GIVEN A 13 HOUR PREMEDICATION PREP.  1) Do not eat or drink anything after Noon (4 hours prior to your test) 2) You have been given 2 bottles of oral contrast to drink. The solution may taste better if refrigerated, but do NOT add ice or any other liquid to this solution. Shake  well before drinking.    Drink 1 bottle of contrast @ 2:00 Pm  (2 hours prior to your exam)  Drink 1 bottle of contrast @ 3:00 PM  (1 hour prior to your exam)  You may take any medications as prescribed with a small amount of water except for the following: Metformin, Glucophage, Glucovance, Avandamet, Riomet, Fortamet, Actoplus Met, Janumet, Glumetza or Metaglip. The above medications must be held the day of the exam AND 48 hours after the exam.  The purpose of you drinking  the oral contrast is to aid in the visualization of your intestinal tract. The contrast solution may cause some diarrhea. Before your exam is started, you will be given a small amount of fluid to drink. Depending on your individual set of symptoms, you may also receive an intravenous injection of x-ray contrast/dye. Plan on being at [facility] for 30 minutes or long, depending on the type of exam you are having performed.  If you have any questions regarding your exam or if you need to reschedule, you may call the CT department at [phone] between the hours of 8:00 am and 5:00 pm, Monday-Friday.  We have given you a written prescription for Norco Hydrocodone acetametophin 5/325 mg. Amy Esterwood PA-C signed the prescription for you to take to your pharmacy. ________________________________________________________________________

## 2013-04-15 NOTE — Progress Notes (Signed)
Patient ID: [name], male   DOB: 02/05/1966, 47 y.o.   MRN: [id number]  Chief Complaint  Patient presents with  . Other    Evaluation of umbilical hernia    HPI EDWARD GUTHMILLER is a 47 y.o. male who presents for an evaluation of an umbilical hernia. The patient states he noticed it approximately 6 months ago. It has gotten larger in size and has become painful. The describes the pain as an ache that is constant. It becomes more painful with touch.   The patient experienced some upper abdominal painEarlier this month, and this was associated with some nausea and vomiting as well as a change in his bowel pattern. He was evaluated at the ED on April 06, 2013. CT scan was obtained at that time showed no acute process. Laboratory studies were unremarkable.   HPI  Past Medical History  Diagnosis Date  . Hypertension     essential, benign  . Anxiety and depression   . Chest pain     hx  . Sleep apnea   . HLD (hyperlipidemia)   . Palpitation     hx  . Other chest pain     tightness, pressure  . Precordial pain   . Murmur   . Bruit     L  . Impotence of organic origin   . Edema   . Goiter     Past Surgical History  Procedure Laterality Date  . Knee arthroscopy    . Appendectomy  2002    done at [facility]  . Total knee arthroplasty      right    Family History  Problem Relation Age of Onset  . Heart disease Father     Social History History  Substance Use Topics  . Smoking status: Former Smoker -- 2.00 packs/day for 18 years    Types: Cigarettes    Quit date: 04/24/2005  . Smokeless tobacco: Never Used     Comment: quit 20 years ago   . Alcohol Use: Yes     Comment: 1 a day    No Known Allergies  Current Outpatient Prescriptions  Medication Sig Dispense Refill  . ALPRAZolam (XANAX) 0.5 MG tablet Take 0.5 mg by mouth at bedtime as needed for sleep.      Marland Kitchen amoxicillin (AMOXIL) 500 MG capsule Take 500 mg by mouth. Take 4 capsules prior to dental procedures.       Marland Kitchen aspirin 81 MG EC tablet Take 81 mg by mouth daily.        Marland Kitchen buPROPion (WELLBUTRIN SR) 150 MG 12 hr tablet Take 150 mg by mouth 2 (two) times daily.      . citalopram (CELEXA) 20 MG tablet Take 1 tablet (20 mg total) by mouth daily.  30 tablet  2  . HYDROcodone-acetaminophen (NORCO/VICODIN) 5-325 MG per tablet Take 1 twice daily as needed for pain.  20 tablet  0  . ibuprofen (ADVIL,MOTRIN) 200 MG tablet Take 400 mg by mouth every 6 (six) hours as needed.      Marland Kitchen levothyroxine (SYNTHROID, LEVOTHROID) 88 MCG tablet Take 88 mcg by mouth daily before breakfast.      . lisinopril-hydrochlorothiazide (PRINZIDE,ZESTORETIC) 20-25 MG per tablet Take 1 tablet by mouth daily.  30 tablet  5  . methocarbamol (ROBAXIN) 500 MG tablet Take 500 mg by mouth every 8 (eight) hours as needed for muscle spasms.      . metoprolol tartrate (LOPRESSOR) 25 MG tablet Take 1 tablet (25 mg  total) by mouth 2 (two) times daily.  60 tablet  5  . Multiple Vitamin (MULTIVITAMIN) tablet Take 1 tablet by mouth daily.        Marland Kitchen omeprazole (PRILOSEC) 40 MG capsule Take 1 tab twice daily for one month.  60 capsule  0  . traMADol (ULTRAM) 50 MG tablet Take 50 mg by mouth every 6 (six) hours as needed for moderate pain.       No current facility-administered medications for this visit.    Review of Systems Review of Systems  Constitutional: Negative.   Respiratory: Negative.   Cardiovascular: Negative.     Blood pressure 164/84, pulse 74, resp. rate 12, height 6' (1.829 m), weight 223 lb (101.152 kg).  Physical Exam Physical Exam  Constitutional: He is oriented to person, place, and time. He appears well-developed and well-nourished.  Neck: No thyromegaly present.  Cardiovascular: Normal rate, regular rhythm and normal heart sounds.   No murmur heard. Pulmonary/Chest: Effort normal and breath sounds normal.  Abdominal: Soft. Normal appearance and bowel sounds are normal. There is tenderness in the periumbilical area. A  hernia (8 mm defect at the umbilicus. ) is present.  Lymphadenopathy:    He has no cervical adenopathy.  Neurological: He is alert and oriented to person, place, and time.  Skin: Skin is warm and dry.    Data Reviewed Laboratory studies dated April 06, 2013 showed had elevated blood sugar 163, normal electrolytes, creatinine 0.86 with an estimated GFR of greater than 60. Lipase 125, alcohol 0.077%. Liver function studies notable for a scant elevation of the SGOT at 60. Negative troponin. CBC showed a white blood cell count of 4200 with 71% probably, 70% lymphocytes. Hemoglobin 15.0. ECG: Incomplete right bundle branch block, normal sinus rhythm, left axis deviation. Normal urinalysis.   Assessment    Abdominal pain, no clear etiology.  Symptomatic umbilical hernia.     Plan    The role of elective hernia repair was reviewed. It is unlikely that mesh will be required, but the patient is amenable to placement if clinically indicated. The GI office will be notified that he did have a CT scan earlier this month to avoid duplication of services.     Patient's surgery has been scheduled for 05-15-13 at [facility]. It is okay for patient to continue 81 mg aspirin.    Earline Mayotte 04/15/2013, 8:31 PM   who

## 2013-04-21 ENCOUNTER — Inpatient Hospital Stay: Admission: RE | Admit: 2013-04-21 | Payer: BC Managed Care – PPO | Source: Ambulatory Visit

## 2013-04-22 ENCOUNTER — Telehealth: Payer: Self-pay | Admitting: Internal Medicine

## 2013-04-22 NOTE — Telephone Encounter (Signed)
Pt called to get a refill on hydrocodine 7.5/325 His wife will be here tomorrow and he wanted to know if she could pick this up

## 2013-04-23 ENCOUNTER — Telehealth: Payer: Self-pay | Admitting: Internal Medicine

## 2013-04-23 ENCOUNTER — Other Ambulatory Visit: Payer: Self-pay | Admitting: Internal Medicine

## 2013-04-23 MED ORDER — HYDROCODONE-ACETAMINOPHEN 7.5-325 MG PO TABS: 1.0000 | ORAL_TABLET | Freq: Four times a day (QID) | ORAL | Status: DC | PRN

## 2013-04-23 NOTE — Telephone Encounter (Signed)
Please check with Mr [facility] pharmacy re hydrocodone refills.  Got #20 on Dec 23 from GI . Chart also suggests he got #90 on Dec 18th from ER doc. I cannot refill until Jan 28th if that is the case

## 2013-04-23 NOTE — Telephone Encounter (Signed)
Spoke with Tiffany at [facility], pt has not filled the 04/15/13 Rx 5/325 mg # 20, it is still on file. Last refill for Hydrocodone 7.5/325 mg #90 on 03/21/13

## 2013-04-23 NOTE — Telephone Encounter (Signed)
Pt notified Rx ready for pickup 

## 2013-04-23 NOTE — Telephone Encounter (Signed)
Patient received vicdoin on dec 23 rd from another doc so refill cannot be done at this time utnil i have time to research it.,

## 2013-04-25 NOTE — Telephone Encounter (Signed)
Rx was printed on 04/23/13

## 2013-04-30 ENCOUNTER — Ambulatory Visit (AMBULATORY_SURGERY_CENTER): Payer: BC Managed Care – PPO | Admitting: Gastroenterology

## 2013-04-30 ENCOUNTER — Other Ambulatory Visit: Payer: Self-pay | Admitting: General Surgery

## 2013-04-30 ENCOUNTER — Encounter: Payer: Self-pay | Admitting: Gastroenterology

## 2013-04-30 ENCOUNTER — Other Ambulatory Visit: Payer: Self-pay

## 2013-04-30 VITALS — BP 149/88 | HR 79 | Temp 98.2°F | Resp 12 | Ht 72.0 in | Wt 225.0 lb

## 2013-04-30 DIAGNOSIS — K429 Umbilical hernia without obstruction or gangrene: Secondary | ICD-10-CM

## 2013-04-30 DIAGNOSIS — R1013 Epigastric pain: Secondary | ICD-10-CM

## 2013-04-30 HISTORY — PX: UPPER GASTROINTESTINAL ENDOSCOPY: SHX188

## 2013-04-30 MED ORDER — CILIDINIUM-CHLORDIAZEPOXIDE 2.5-5 MG PO CAPS: 1.0000 | ORAL_CAPSULE | Freq: Three times a day (TID) | ORAL | Status: DC

## 2013-04-30 MED ORDER — SODIUM CHLORIDE 0.9 % IV SOLN: 500.0000 mL | INTRAVENOUS | Status: DC

## 2013-04-30 NOTE — Telephone Encounter (Signed)
Printed RX and faxed it, cannot go electronically

## 2013-04-30 NOTE — Addendum Note (Signed)
Addended by: Hope Pigeon A on: 04/30/2013 01:35 PM   Modules accepted: Orders

## 2013-04-30 NOTE — Progress Notes (Signed)
Report to pacu rn, vss, bbs=clear 

## 2013-04-30 NOTE — Op Note (Signed)
[facility]  [address]. [place], [address]   ENDOSCOPY PROCEDURE REPORT  PATIENT: Joshua, [name]  MR#: [id number] BIRTHDATE: 1965/08/17 , 47  yrs. old GENDER: Male ENDOSCOPIST:Shavana Calder Consuello Masse, MD, [facility] REFERRED BY: PROCEDURE DATE:  04/30/2013 PROCEDURE:   EGD w/ biopsy for H.pylori ASA CLASS:    Class III INDICATIONS: abdominal pain. MEDICATION: propofol (Diprivan) 200mg  IV TOPICAL ANESTHETIC:  DESCRIPTION OF PROCEDURE:   After the risks and benefits of the procedure were explained, informed consent was obtained.  The LB [id number]  endoscope was introduced through the mouth  and advanced to the second portion of the duodenum .  The instrument was slowly withdrawn as the mucosa was fully examined.      ESOPHAGUS: The mucosa of the esophagus appeared normal.  DUODENUM: The duodenal mucosa showed no abnormalities.  STOMACH: There was mild antral gastropathy noted. CLO biopsy done,no ulcers noted   Retroflexed views revealed no abnormalities.    The scope was then withdrawn from the patient and the procedure completed.  COMPLICATIONS: There were no complications.   ENDOSCOPIC IMPRESSION: 1.   The mucosa of the esophagus appeared normal 2.   The duodenal mucosa showed no abnormalities 3.   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

## 2013-04-30 NOTE — Patient Instructions (Signed)

## 2013-04-30 NOTE — Progress Notes (Signed)
Called into the procedure room after the egd was completed.  Nevin Bloodgood, CMA reported CLo test taken @ 11:50.  Maw

## 2013-05-01 ENCOUNTER — Telehealth: Payer: Self-pay | Admitting: *Deleted

## 2013-05-01 LAB — HELICOBACTER PYLORI SCREEN-BIOPSY: UREASE: NEGATIVE

## 2013-05-01 NOTE — Telephone Encounter (Signed)
  Follow up Call-  Call back number 04/30/2013  Post procedure Call Back phone  # [phone]  Permission to leave phone message Yes     Patient questions:  Do you have a fever, pain , or abdominal swelling? no Pain Score  0 *  Have you tolerated food without any problems? yes  Have you been able to return to your normal activities? yes  Do you have any questions about your discharge instructions: Diet   no Medications  no Follow up visit  no  Do you have questions or concerns about your Care? no  Actions: * If pain score is 4 or above: No action needed, pain <4.

## 2013-05-02 ENCOUNTER — Encounter: Payer: Self-pay | Admitting: Gastroenterology

## 2013-05-06 ENCOUNTER — Ambulatory Visit: Payer: Self-pay | Admitting: General Surgery

## 2013-05-06 LAB — POTASSIUM: Potassium: 4.2 mmol/L (ref 3.5–5.1)

## 2013-05-15 ENCOUNTER — Ambulatory Visit: Payer: Self-pay | Admitting: General Surgery

## 2013-05-15 DIAGNOSIS — K429 Umbilical hernia without obstruction or gangrene: Secondary | ICD-10-CM

## 2013-05-15 HISTORY — PX: HERNIA REPAIR: SHX51

## 2013-05-16 ENCOUNTER — Encounter: Payer: Self-pay | Admitting: General Surgery

## 2013-05-21 ENCOUNTER — Ambulatory Visit (INDEPENDENT_AMBULATORY_CARE_PROVIDER_SITE_OTHER): Payer: BC Managed Care – PPO | Admitting: General Surgery

## 2013-05-21 ENCOUNTER — Encounter: Payer: Self-pay | Admitting: General Surgery

## 2013-05-21 VITALS — BP 112/58 | HR 58 | Resp 12 | Ht 72.0 in | Wt 224.0 lb

## 2013-05-21 DIAGNOSIS — K429 Umbilical hernia without obstruction or gangrene: Secondary | ICD-10-CM

## 2013-05-21 NOTE — Progress Notes (Signed)
Patient ID: JABRE HEO, male   DOB: [birth year], 48 y.o.   MRN: [id number]  Chief Complaint  Patient presents with  . Routine Post Op    umbilical hernia    HPI SCOTTY WEIGELT is a 48 y.o. male.  Here today for postoperative visit he had umbilical hernia 10-31-60. States he is getting along well. No gastrointestinal issues. States he is back at work.  HPI  Past Medical History  Diagnosis Date  . Hypertension     essential, benign  . Anxiety and depression   . Chest pain     hx  . Sleep apnea   . HLD (hyperlipidemia)   . Palpitation     hx  . Other chest pain     tightness, pressure  . Precordial pain   . Murmur   . Bruit     L  . Impotence of organic origin   . Edema   . Goiter     Past Surgical History  Procedure Laterality Date  . Knee arthroscopy    . Appendectomy  2002    done at [facility]  . Total knee arthroplasty      right  . Hernia repair  6-94-85    umbilical    Family History  Problem Relation Age of Onset  . Heart disease Father     Social History History  Substance Use Topics  . Smoking status: Former Smoker -- 2.00 packs/day for 18 years    Types: Cigarettes    Quit date: 04/24/2005  . Smokeless tobacco: Never Used     Comment: quit 20 years ago   . Alcohol Use: Yes     Comment: 1 a day    No Known Allergies  Current Outpatient Prescriptions  Medication Sig Dispense Refill  . ALPRAZolam (XANAX) 0.5 MG tablet Take 0.5 mg by mouth at bedtime as needed for sleep.      Marland Kitchen amoxicillin (AMOXIL) 500 MG capsule Take 500 mg by mouth. Take 4 capsules prior to dental procedures.      Marland Kitchen aspirin 81 MG EC tablet Take 81 mg by mouth daily.        Marland Kitchen buPROPion (WELLBUTRIN SR) 150 MG 12 hr tablet Take 150 mg by mouth 2 (two) times daily.      . citalopram (CELEXA) 20 MG tablet Take 1 tablet (20 mg total) by mouth daily.  30 tablet  2  . clidinium-chlordiazePOXIDE (LIBRAX) 5-2.5 MG per capsule Take 1 capsule by mouth 3 (three) times daily before meals.   60 capsule  3  . HYDROcodone-acetaminophen (NORCO) 7.5-325 MG per tablet Take 1 tablet by mouth every 6 (six) hours as needed for moderate pain.  120 tablet  0  . ibuprofen (ADVIL,MOTRIN) 200 MG tablet Take 400 mg by mouth every 6 (six) hours as needed.      Marland Kitchen levothyroxine (SYNTHROID, LEVOTHROID) 88 MCG tablet Take 88 mcg by mouth daily before breakfast.      . lisinopril-hydrochlorothiazide (PRINZIDE,ZESTORETIC) 20-25 MG per tablet Take 1 tablet by mouth daily.  30 tablet  5  . methocarbamol (ROBAXIN) 500 MG tablet Take 500 mg by mouth every 8 (eight) hours as needed for muscle spasms.      . metoprolol tartrate (LOPRESSOR) 25 MG tablet Take 1 tablet (25 mg total) by mouth 2 (two) times daily.  60 tablet  5  . Multiple Vitamin (MULTIVITAMIN) tablet Take 1 tablet by mouth daily.        Marland Kitchen omeprazole (PRILOSEC)  40 MG capsule Take 1 tab twice daily for one month.  60 capsule  0  . traMADol (ULTRAM) 50 MG tablet Take 50 mg by mouth every 6 (six) hours as needed for moderate pain.       No current facility-administered medications for this visit.    Review of Systems Review of Systems  Constitutional: Negative.   Respiratory: Negative.   Cardiovascular: Negative.   Gastrointestinal: Negative for nausea, vomiting, diarrhea and constipation.    Blood pressure 112/58, pulse 58, resp. rate 12, height 6' (1.829 m), weight 224 lb (101.606 kg).  Physical Exam Physical Exam  Constitutional: He is oriented to person, place, and time. He appears well-developed and well-nourished.  Abdominal: Soft. Normal appearance.  Incision healing, minimal swelling.  Neurological: He is alert and oriented to person, place, and time.  Skin: Skin is warm and dry.    Data Reviewed None.   Assessment    Doing well status post primary repair of middle hernia.     Plan    Proper lifting techniques reviewed. Resume activities as tolerated. Follow up here will be on an as needed basis.       Robert Bellow 05/22/2013, 6:52 PM

## 2013-05-21 NOTE — Patient Instructions (Addendum)
The patient is aware to call back for any questions or concerns.    Proper lifting techniques reviewed. Resume activities as tolerated. 

## 2013-05-22 ENCOUNTER — Telehealth: Payer: Self-pay | Admitting: *Deleted

## 2013-05-22 NOTE — Telephone Encounter (Signed)
Talked with the patient, sates he is still using the heating pad for comfort and that it helps.  Encouraged to give healing several more weeks and to monitor the area for changes.  Call for further questions or concerns.

## 2013-05-22 NOTE — Telephone Encounter (Signed)
Pt called stating that he had umbilical hernia repair surgery about a week ago with Dr.Byrnett and he was seen yesterday 05/21/13 in the office, he wanted to ask you a question that he forgot to ask when he left, when he bends over to tie his shoes he has a burning sensation at the surgical site. He wanted to know is that normal? His number to reach him at is [phone]

## 2013-05-26 ENCOUNTER — Telehealth: Payer: Self-pay | Admitting: Gastroenterology

## 2013-05-26 NOTE — Telephone Encounter (Signed)
[facility]ed [facility] and spoke with Helyn App to change RX amount RX was changed from 60-90 and two refills

## 2013-05-30 ENCOUNTER — Other Ambulatory Visit: Payer: Self-pay | Admitting: Internal Medicine

## 2013-05-30 ENCOUNTER — Telehealth: Payer: Self-pay | Admitting: *Deleted

## 2013-05-30 MED ORDER — BAYER MICROLET LANCETS MISC: Status: DC

## 2013-05-30 NOTE — Telephone Encounter (Signed)
Refill sent electronically

## 2013-05-30 NOTE — Telephone Encounter (Signed)
Refill request  Microlet Lancets

## 2013-06-22 ENCOUNTER — Other Ambulatory Visit: Payer: Self-pay | Admitting: Internal Medicine

## 2013-06-23 NOTE — Telephone Encounter (Signed)
Last visit 01/30/13, refill?

## 2013-06-25 ENCOUNTER — Other Ambulatory Visit: Payer: Self-pay | Admitting: *Deleted

## 2013-06-26 ENCOUNTER — Other Ambulatory Visit: Payer: Self-pay | Admitting: *Deleted

## 2013-06-26 ENCOUNTER — Telehealth: Payer: Self-pay | Admitting: *Deleted

## 2013-06-26 NOTE — Telephone Encounter (Signed)
Okay to refill? 

## 2013-06-26 NOTE — Telephone Encounter (Signed)
He last saw me in October and was diagnosed with DM and asked to return in 3 months.  Since he has not done so, I will not refill his narcotics until he is seen.

## 2013-06-26 NOTE — Telephone Encounter (Signed)
Patient called requesting refill on Norco last OV 03/12/13 with Charolette Forward NP please advise.

## 2013-07-15 ENCOUNTER — Ambulatory Visit (INDEPENDENT_AMBULATORY_CARE_PROVIDER_SITE_OTHER): Payer: BC Managed Care – PPO | Admitting: Internal Medicine

## 2013-07-15 ENCOUNTER — Encounter: Payer: Self-pay | Admitting: Internal Medicine

## 2013-07-15 VITALS — BP 136/76 | HR 65 | Temp 98.3°F | Resp 16 | Wt 229.2 lb

## 2013-07-15 DIAGNOSIS — E119 Type 2 diabetes mellitus without complications: Secondary | ICD-10-CM

## 2013-07-15 DIAGNOSIS — E785 Hyperlipidemia, unspecified: Secondary | ICD-10-CM

## 2013-07-15 DIAGNOSIS — E669 Obesity, unspecified: Secondary | ICD-10-CM

## 2013-07-15 DIAGNOSIS — K7689 Other specified diseases of liver: Secondary | ICD-10-CM

## 2013-07-15 DIAGNOSIS — F341 Dysthymic disorder: Secondary | ICD-10-CM

## 2013-07-15 DIAGNOSIS — E039 Hypothyroidism, unspecified: Secondary | ICD-10-CM

## 2013-07-15 DIAGNOSIS — I10 Essential (primary) hypertension: Secondary | ICD-10-CM

## 2013-07-15 DIAGNOSIS — K760 Fatty (change of) liver, not elsewhere classified: Secondary | ICD-10-CM

## 2013-07-15 DIAGNOSIS — E1169 Type 2 diabetes mellitus with other specified complication: Secondary | ICD-10-CM

## 2013-07-15 MED ORDER — HYDROCODONE-ACETAMINOPHEN 7.5-325 MG PO TABS: 1.0000 | ORAL_TABLET | Freq: Four times a day (QID) | ORAL | Status: DC | PRN

## 2013-07-15 NOTE — Patient Instructions (Signed)
Your blood sugars are doing much better!  Your A1c should be close to 7.0 on your next check  Return for fasting   labs including thyroid as soon as possible

## 2013-07-15 NOTE — Progress Notes (Signed)
Patient ID: [name], male   DOB: 27-Oct-1965, 48 y.o.   MRN: [id number]   Patient Active Problem List   Diagnosis Date Noted  . Umbilical hernia 26/71/2458  . Diabetes mellitus type 2 in obese 10/23/2012  . Seborrheic dermatitis, unspecified 05/17/2012  . Elevated glucose 04/22/2012  . Fatty liver 04/22/2012  . Polyarthralgia 12/24/2010  . Sciatica of right side 12/24/2010  . HYPERLIPIDEMIA 01/13/2009  . ANXIETY DEPRESSION 01/13/2009  . HYPERTENSION 01/13/2009  . OSA (obstructive sleep apnea) 01/13/2009    Subjective:  CC:   Chief Complaint  Patient presents with  . Follow-up  . Diabetes    HPI:   [name] is a 48 y.o. male who presents for  Follow up on diabetes,  Last seen in October 2014.  At that time had not embraced the diagnosis and was not checking sugars or trying to lose weight.   Did not return for 3 month follow up.  Lab Results  Component Value Date   HGBA1C 7.8* 01/23/2013    He has  modified  His diet.  Has given up sodas.  Drinks Used to drink and sprite.  And has given up the sprite. Still drinks bourbon 2 ounces or less per night.  No potatoes.  Random sugars have been < 150  When he checks,  And  Have been no higher than 155.  No weight loss yet but his waist cirucference has been reduced by one men's size .   In the interim he was hospitalized  for abdominal pain.  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  Constipation resolved  With nightly use of a stool softener   He also had his umbilical  hernia repaired by Dr Job Founds. Marland Kitchen   He is wearing his CPAP every night 8-to 9 hours er night 8 yrs ago ,  Dr Halford Chessman   Past Medical History  Diagnosis Date  . Hypertension     essential, benign  . Anxiety and depression   . Chest pain     hx  . Sleep apnea   . HLD  (hyperlipidemia)   . Palpitation     hx  . Other chest pain     tightness, pressure  . Precordial pain   . Murmur   . Bruit     L  . Impotence of organic origin   . Edema   . Goiter     Past Surgical History  Procedure Laterality Date  . Knee arthroscopy    . Appendectomy  2002    done at [facility]  . Total knee arthroplasty      right  . Hernia repair  0-99-83    umbilical       The following portions of the patient's history were reviewed and updated as appropriate: Allergies, current medications, and problem list.    Review of Systems:   Patient denies headache, fevers, malaise, unintentional weight loss, skin rash, eye pain, sinus congestion and sinus pain, sore throat, dysphagia,  hemoptysis , cough, dyspnea, wheezing, chest pain, palpitations, orthopnea, edema, abdominal pain, nausea, melena, diarrhea, constipation, flank pain, dysuria, hematuria, urinary  Frequency, nocturia, numbness, tingling, seizures,  Focal weakness, Loss of consciousness,  Tremor, insomnia, depression, anxiety, and suicidal ideation.     History   Social History  . Marital  Status: Married    Spouse Name: N/A    Number of Children: 2  . Years of Education: N/A   Occupational History  . Maintenance supervisor   . SERVICE DIRECTOR    Social History Main Topics  . Smoking status: Former Smoker -- 2.00 packs/day for 18 years    Types: Cigarettes    Quit date: 04/24/2005  . Smokeless tobacco: Never Used     Comment: quit 20 years ago   . Alcohol Use: Yes     Comment: 1 a day  . Drug Use: No  . Sexual Activity: Not on file   Other Topics Concern  . Not on file   Social History Narrative   Married, gets regular exercise.     Objective:  Filed Vitals:   07/15/13 1556  BP: 136/76  Pulse: 65  Temp: 98.3 F (36.8 C)  Resp: 16     General appearance: alert, cooperative and appears stated age Ears: normal TM's and external ear canals both ears Throat: lips, mucosa, and tongue  normal; teeth and gums normal Neck: no adenopathy, no carotid bruit, supple, symmetrical, trachea midline and thyroid not enlarged, symmetric, no tenderness/mass/nodules Back: symmetric, no curvature. ROM normal. No CVA tenderness. Lungs: clear to auscultation bilaterally Heart: regular rate and rhythm, S1, S2 normal, no murmur, click, rub or gallop Abdomen: soft, non-tender; bowel sounds normal; no masses,  no organomegaly Pulses: 2+ and symmetric Skin: Skin color, texture, turgor normal. No rashes or lesions Lymph nodes: Cervical, supraclavicular, and axillary nodes normal.  Assessment and Plan:  HYPERLIPIDEMIA Patient has been modifying diet in an effort to avoid starting statin , which was recommended in October according to [facility] guidelines due to concurrent diagnosis of DM Type 2 and fatty liver .  He will return for fasting lipids. Lab Results  Component Value Date   CHOL 228* 10/22/2012   HDL 37.00* 10/22/2012   LDLDIRECT 163.7 10/22/2012   TRIG 248.0* 10/22/2012   CHOLHDL 6 10/22/2012     Fatty liver  Based on abdominal ultrasound, new onset DM Type 2 and serologies negative for other causes of hepatitis.   Discussed  the long term consequences including cirrhosis,  as well as management with weight loss of 24 lbs (10%) and control of hyperlipidemia, and  DM.  He is folowing a lower glycemic index and was referred to Deatra James at [facility] for definitive diagnosis with liver biopsy  Which was done in October but results are not reviewable  Despite ue of EPIC portal.        ANXIETY DEPRESSION With recent admission for abdominal pain at [facility] , normal endoscopy.  His discharge medications suggest diagnosis is  IBD (librium, prilosec and stool softener).  Records not viewable on EPIC portal .  He is symptim free currently.   Diabetes mellitus type 2 in obese He has been modifying his diet and check BS once daily .  Reminder for annual diabetic eye exam given..  Foot exam done. Meds reviewed  and he is on a baby aspirin daily,  No statin per patient preference and an ACE inhibitor.  Urine to be tested   for protein.    HYPERTENSION Well controlled on current regimen. Renal function stable, no changes today.  A total of 40 minutes was spent with patient more than half of which was spent in counseling, reviewing records from other providers and coordination of care.  Updated Medication List Outpatient Encounter Prescriptions as of 07/15/2013  Medication Sig  .  ALPRAZolam (XANAX) 0.5 MG tablet TAKE 1 TABLET BY MOUTH AT BEDTIME AS NEEDED  . amoxicillin (AMOXIL) 500 MG capsule Take 500 mg by mouth. Take 4 capsules prior to dental procedures.  Marland Kitchen aspirin 81 MG EC tablet Take 81 mg by mouth daily.    Marland Kitchen BAYER MICROLET LANCETS lancets Use as instructed  . buPROPion (WELLBUTRIN SR) 150 MG 12 hr tablet Take 150 mg by mouth 2 (two) times daily.  . citalopram (CELEXA) 20 MG tablet TAKE 1 TABLET (20 MG TOTAL) BY MOUTH DAILY.  . clidinium-chlordiazePOXIDE (LIBRAX) 5-2.5 MG per capsule Take 1 capsule by mouth 3 (three) times daily before meals.  Marland Kitchen HYDROcodone-acetaminophen (NORCO) 7.5-325 MG per tablet Take 1 tablet by mouth every 6 (six) hours as needed for moderate pain.  Marland Kitchen ibuprofen (ADVIL,MOTRIN) 200 MG tablet Take 400 mg by mouth every 6 (six) hours as needed.  Marland Kitchen levothyroxine (SYNTHROID, LEVOTHROID) 88 MCG tablet Take 88 mcg by mouth daily before breakfast.  . lisinopril-hydrochlorothiazide (PRINZIDE,ZESTORETIC) 20-25 MG per tablet Take 1 tablet by mouth daily.  . methocarbamol (ROBAXIN) 500 MG tablet Take 500 mg by mouth every 8 (eight) hours as needed for muscle spasms.  . metoprolol tartrate (LOPRESSOR) 25 MG tablet Take 1 tablet (25 mg total) by mouth 2 (two) times daily.  . Multiple Vitamin (MULTIVITAMIN) tablet Take 1 tablet by mouth daily.    Marland Kitchen omeprazole (PRILOSEC) 40 MG capsule Take 1 tab twice daily for one month.  . traMADol (ULTRAM) 50 MG tablet Take 50 mg by mouth every 6 (six)  hours as needed for moderate pain.  . [DISCONTINUED] HYDROcodone-acetaminophen (NORCO) 7.5-325 MG per tablet Take 1 tablet by mouth every 6 (six) hours as needed for moderate pain.  . [DISCONTINUED] citalopram (CELEXA) 20 MG tablet Take 1 tablet (20 mg total) by mouth daily.     Orders Placed This Encounter  Procedures  . TSH  . Hemoglobin A1c  . Lipid panel  . Microalbumin / creatinine urine ratio  . Comprehensive metabolic panel    Return in about 3 months (around 10/15/2013) for follow up diabetes.

## 2013-07-15 NOTE — Progress Notes (Signed)
Pre-visit discussion using our clinic review tool. No additional management support is needed unless otherwise documented below in the visit note.  

## 2013-07-17 ENCOUNTER — Encounter: Payer: Self-pay | Admitting: Internal Medicine

## 2013-07-17 NOTE — Assessment & Plan Note (Signed)
With recent admission for abdominal pain at [facility] , normal endoscopy.  His discharge medications suggest diagnosis is  IBD (librium, prilosec and stool softener).  Records not viewable on EPIC portal .  He is symptim free currently.

## 2013-07-17 NOTE — Assessment & Plan Note (Signed)
Well controlled on current regimen. Renal function stable, no changes today. 

## 2013-07-17 NOTE — Assessment & Plan Note (Signed)
He has been modifying his diet and check BS once daily .  Reminder for annual diabetic eye exam given..  Foot exam done. Meds reviewed and he is on a baby aspirin daily,  No statin per patient preference and an ACE inhibitor.  Urine to be tested   for protein.

## 2013-07-17 NOTE — Assessment & Plan Note (Addendum)
Based on abdominal ultrasound, new onset DM Type 2 and serologies negative for other causes of hepatitis.   Discussed  the long term consequences including cirrhosis,  as well as management with weight loss of 24 lbs (10%) and control of hyperlipidemia, and  DM.  He is folowing a lower glycemic index and was referred to Deatra James at [facility] for definitive diagnosis with liver biopsy  Which was done in October but results are not reviewable  Despite ue of EPIC portal.

## 2013-07-17 NOTE — Assessment & Plan Note (Addendum)
Patient has been modifying diet in an effort to avoid starting statin , which was recommended in October according to [facility] guidelines due to concurrent diagnosis of DM Type 2 and fatty liver .  He will return for fasting lipids. Lab Results  Component Value Date   CHOL 228* 10/22/2012   HDL 37.00* 10/22/2012   LDLDIRECT 163.7 10/22/2012   TRIG 248.0* 10/22/2012   CHOLHDL 6 10/22/2012

## 2013-07-22 ENCOUNTER — Telehealth: Payer: Self-pay

## 2013-07-22 NOTE — Telephone Encounter (Signed)
Relevant patient education assigned to patient using Emmi. ° °

## 2013-07-23 ENCOUNTER — Other Ambulatory Visit (INDEPENDENT_AMBULATORY_CARE_PROVIDER_SITE_OTHER): Payer: BC Managed Care – PPO

## 2013-07-23 DIAGNOSIS — E039 Hypothyroidism, unspecified: Secondary | ICD-10-CM

## 2013-07-23 DIAGNOSIS — E119 Type 2 diabetes mellitus without complications: Secondary | ICD-10-CM

## 2013-07-23 LAB — LIPID PANEL
CHOL/HDL RATIO: 7
Cholesterol: 219 mg/dL — ABNORMAL HIGH (ref 0–200)
HDL: 31.7 mg/dL — AB (ref >39.00)
LDL Cholesterol: 118 mg/dL — ABNORMAL HIGH (ref 0–99)
Triglycerides: 347 mg/dL — ABNORMAL HIGH (ref 0.0–149.0)
VLDL: 69.4 mg/dL — ABNORMAL HIGH (ref 0.0–40.0)

## 2013-07-23 LAB — COMPREHENSIVE METABOLIC PANEL
ALBUMIN: 4.3 g/dL (ref 3.5–5.2)
ALK PHOS: 76 U/L (ref 39–117)
ALT: 28 U/L (ref 0–53)
AST: 26 U/L (ref 0–37)
BILIRUBIN TOTAL: 0.7 mg/dL (ref 0.3–1.2)
BUN: 13 mg/dL (ref 6–23)
CO2: 28 mEq/L (ref 19–32)
Calcium: 8.9 mg/dL (ref 8.4–10.5)
Chloride: 101 mEq/L (ref 96–112)
Creatinine, Ser: 0.9 mg/dL (ref 0.4–1.5)
GFR: 93.05 mL/min (ref >60.00)
GLUCOSE: 135 mg/dL — AB (ref 70–99)
POTASSIUM: 4.2 meq/L (ref 3.5–5.1)
SODIUM: 136 meq/L (ref 135–145)
TOTAL PROTEIN: 6.9 g/dL (ref 6.0–8.3)

## 2013-07-23 LAB — TSH: TSH: 3.19 u[IU]/mL (ref 0.35–5.50)

## 2013-07-23 LAB — MICROALBUMIN / CREATININE URINE RATIO
Creatinine,U: 166.7 mg/dL
MICROALB/CREAT RATIO: 0.8 mg/g (ref 0.0–30.0)
Microalb, Ur: 1.3 mg/dL (ref 0.0–1.9)

## 2013-07-23 LAB — HEMOGLOBIN A1C: HEMOGLOBIN A1C: 6.2 % (ref 4.6–6.5)

## 2013-07-25 ENCOUNTER — Encounter: Payer: Self-pay | Admitting: Internal Medicine

## 2013-07-28 LAB — HM DIABETES EYE EXAM

## 2013-07-29 NOTE — Telephone Encounter (Signed)
Mailed unread MyChart message

## 2013-08-05 ENCOUNTER — Other Ambulatory Visit: Payer: Self-pay | Admitting: *Deleted

## 2013-08-05 MED ORDER — LEVOTHYROXINE SODIUM 88 MCG PO TABS: 88.0000 ug | ORAL_TABLET | Freq: Every day | ORAL | Status: DC

## 2013-08-05 MED ORDER — BUPROPION HCL ER (SR) 150 MG PO TB12: 150.0000 mg | ORAL_TABLET | Freq: Two times a day (BID) | ORAL | Status: DC

## 2013-08-06 ENCOUNTER — Encounter: Payer: Self-pay | Admitting: Pulmonary Disease

## 2013-08-06 ENCOUNTER — Ambulatory Visit (INDEPENDENT_AMBULATORY_CARE_PROVIDER_SITE_OTHER): Payer: BC Managed Care – PPO | Admitting: Pulmonary Disease

## 2013-08-06 VITALS — BP 142/96 | HR 65 | Ht 72.0 in | Wt 232.0 lb

## 2013-08-06 DIAGNOSIS — G4733 Obstructive sleep apnea (adult) (pediatric): Secondary | ICD-10-CM

## 2013-08-06 NOTE — Assessment & Plan Note (Signed)
He reports compliance with CPAP, but is having more difficulty with his sleep and daytime sleepiness.  His machine is > 49 years old, and he needs a new CPAP.  Will arrange for new auto CPAP device with range of 5 to 15 cm H2O.  Will then get download after he uses new device for several weeks.  If he still has difficulty after this, then he will need in lab sleep study to further assess.

## 2013-08-06 NOTE — Patient Instructions (Signed)
Will arrange for new CPAP machine Will get report from new CPAP machine and call with results Follow up in 1 year

## 2013-08-06 NOTE — Progress Notes (Signed)
Chief Complaint  Patient presents with  . Sleep Apnea    Currently using CPAP machine every night. Does not feel well rested after use. Not sleeping well.    History of Present Illness: [name] is a 48 y.o. male with OSA.  He remains compliant with CPAP.  He is concerned that he is not sleeping as well as before.  He is snoring more, and feels tired during the day.  He has a full face mask, and this fits well >> he last got new mask about 6 months ago.  He goes to bed at 10 pm, and wakes up at 630 am.  He sleeps through the night.  He has the same CPAP machine since 2008.  He uses [employer] for his DME.  He has not had any change to his health or medications since his last visit.  Tests: PSG 01/21/07>>AHI 18, SpO2 low 56%. CPAP 7 cm H2O>>AHI 2.6 Auto CPAP 12/19/10 to 12/29/10>>Used on 9 of 10 nights with average 6 hrs 41 min. Optimal pressure 14 cm H2O with average AHI 2.7.  He  has a past medical history of Hypertension; Anxiety and depression; Chest pain; Sleep apnea; HLD (hyperlipidemia); Palpitation; Other chest pain; Precordial pain; Murmur; Bruit; Impotence of organic origin; Edema; and Goiter.  He  has past surgical history that includes Knee arthroscopy; Appendectomy (2002); Total knee arthroplasty; and Hernia repair (05-15-13).   Current Outpatient Prescriptions on File Prior to Visit  Medication Sig Dispense Refill  . ALPRAZolam (XANAX) 0.5 MG tablet TAKE 1 TABLET BY MOUTH AT BEDTIME AS NEEDED  30 tablet  2  . amoxicillin (AMOXIL) 500 MG capsule Take 500 mg by mouth. Take 4 capsules prior to dental procedures.      Marland Kitchen aspirin 81 MG EC tablet Take 81 mg by mouth daily.        Marland Kitchen BAYER MICROLET LANCETS lancets Use as instructed  100 each  12  . buPROPion (WELLBUTRIN SR) 150 MG 12 hr tablet Take 1 tablet (150 mg total) by mouth 2 (two) times daily.  60 tablet  2  . citalopram (CELEXA) 20 MG tablet TAKE 1 TABLET (20 MG TOTAL) BY MOUTH DAILY.  30 tablet  2  .  clidinium-chlordiazePOXIDE (LIBRAX) 5-2.5 MG per capsule Take 1 capsule by mouth 3 (three) times daily before meals.  60 capsule  3  . HYDROcodone-acetaminophen (NORCO) 7.5-325 MG per tablet Take 1 tablet by mouth every 6 (six) hours as needed for moderate pain.  120 tablet  0  . ibuprofen (ADVIL,MOTRIN) 200 MG tablet Take 400 mg by mouth every 6 (six) hours as needed.      Marland Kitchen levothyroxine (SYNTHROID, LEVOTHROID) 88 MCG tablet Take 1 tablet (88 mcg total) by mouth daily before breakfast.  30 tablet  10  . lisinopril-hydrochlorothiazide (PRINZIDE,ZESTORETIC) 20-25 MG per tablet Take 1 tablet by mouth daily.  30 tablet  5  . methocarbamol (ROBAXIN) 500 MG tablet Take 500 mg by mouth every 8 (eight) hours as needed for muscle spasms.      . metoprolol tartrate (LOPRESSOR) 25 MG tablet Take 1 tablet (25 mg total) by mouth 2 (two) times daily.  60 tablet  5  . Multiple Vitamin (MULTIVITAMIN) tablet Take 1 tablet by mouth daily.        Marland Kitchen omeprazole (PRILOSEC) 40 MG capsule Take 1 tab twice daily for one month.  60 capsule  0  . traMADol (ULTRAM) 50 MG tablet Take 50 mg by mouth every 6 (  six) hours as needed for moderate pain.       No current facility-administered medications on file prior to visit.    No Known Allergies   Physical Exam:  General - No distress ENT - No sinus tenderness, no oral exudate, no LAN, MP 3 Cardiac - s1s2 regular, no murmur Chest - No wheeze/rales/dullness, good air entry, normal respiratory excursion Back - No focal tenderness Abd - Soft, non-tender Ext - No edema Neuro - Normal strength Skin - No rashes Psych - Normal mood, and behavior   Assessment/Plan:  Chesley Mires, MD [employer] Pulmonary/Critical Care/Sleep Pager:  [phone] 08/06/2013, 4:38 PM

## 2013-08-12 ENCOUNTER — Other Ambulatory Visit: Payer: Self-pay | Admitting: *Deleted

## 2013-08-12 MED ORDER — METOPROLOL TARTRATE 25 MG PO TABS: 25.0000 mg | ORAL_TABLET | Freq: Two times a day (BID) | ORAL | Status: DC

## 2013-08-17 ENCOUNTER — Other Ambulatory Visit: Payer: Self-pay | Admitting: Internal Medicine

## 2013-08-18 NOTE — Telephone Encounter (Signed)
Last visit 07/15/13

## 2013-08-20 ENCOUNTER — Other Ambulatory Visit: Payer: Self-pay | Admitting: Internal Medicine

## 2013-08-20 ENCOUNTER — Other Ambulatory Visit: Payer: Self-pay | Admitting: Gastroenterology

## 2013-08-20 NOTE — Telephone Encounter (Signed)
Patient had an office visit with you in 2014. Patient had a procedure with Dr. Sharlett Iles on 04-30-2013. Librax was prescribed on 04-30-2013. Patient is requesting a refill. Is it okay to refill?

## 2013-08-20 NOTE — Telephone Encounter (Signed)
Yes,ok to refill x8

## 2013-08-26 ENCOUNTER — Other Ambulatory Visit (HOSPITAL_COMMUNITY): Payer: Self-pay | Admitting: Orthopedic Surgery

## 2013-08-26 DIAGNOSIS — T84038A Mechanical loosening of other internal prosthetic joint, initial encounter: Secondary | ICD-10-CM

## 2013-08-26 DIAGNOSIS — Z96659 Presence of unspecified artificial knee joint: Secondary | ICD-10-CM

## 2013-09-09 ENCOUNTER — Encounter (HOSPITAL_COMMUNITY)
Admission: RE | Admit: 2013-09-09 | Discharge: 2013-09-09 | Disposition: A | Discharge status: Home / Self Care | Payer: BC Managed Care – PPO | Source: Ambulatory Visit | Attending: Orthopedic Surgery | Admitting: Orthopedic Surgery

## 2013-09-09 DIAGNOSIS — Z96659 Presence of unspecified artificial knee joint: Secondary | ICD-10-CM | POA: Insufficient documentation

## 2013-09-09 DIAGNOSIS — Y929 Unspecified place or not applicable: Secondary | ICD-10-CM | POA: Insufficient documentation

## 2013-09-09 DIAGNOSIS — T84038A Mechanical loosening of other internal prosthetic joint, initial encounter: Secondary | ICD-10-CM

## 2013-09-09 DIAGNOSIS — Y849 Medical procedure, unspecified as the cause of abnormal reaction of the patient, or of later complication, without mention of misadventure at the time of the procedure: Secondary | ICD-10-CM | POA: Insufficient documentation

## 2013-09-09 DIAGNOSIS — T84039A Mechanical loosening of unspecified internal prosthetic joint, initial encounter: Secondary | ICD-10-CM | POA: Insufficient documentation

## 2013-09-09 MED ORDER — TECHNETIUM TC 99M MEDRONATE IV KIT: 24.8000 | PACK | Freq: Once | INTRAVENOUS | Status: AC | PRN

## 2013-09-19 ENCOUNTER — Other Ambulatory Visit: Payer: Self-pay | Admitting: Internal Medicine

## 2013-09-22 ENCOUNTER — Telehealth: Payer: Self-pay | Admitting: Internal Medicine

## 2013-09-22 MED ORDER — HYDROCODONE-ACETAMINOPHEN 7.5-325 MG PO TABS: 1.0000 | ORAL_TABLET | Freq: Four times a day (QID) | ORAL | Status: DC | PRN

## 2013-09-22 NOTE — Telephone Encounter (Signed)
Ok to refill,  printed rx  

## 2013-09-22 NOTE — Telephone Encounter (Signed)
Patient requesting refill on Hydrocodone ok to fill last fill 07/15/13

## 2013-09-22 NOTE — Telephone Encounter (Signed)
Patient notified and script placed at front desk. 

## 2013-10-07 ENCOUNTER — Telehealth: Payer: Self-pay | Admitting: Pulmonary Disease

## 2013-10-07 NOTE — Telephone Encounter (Signed)
Spoke with pt, he is aware of cpap results and recs.  Nothing further needed.

## 2013-10-07 NOTE — Telephone Encounter (Signed)
Auto CPAP 08/19/13 to 09/17/13 >> Used on 30 of 30 nights with average 8 hrs 4 min.  Average AHI 4.1 with median CPAP 10 cm H2O and 95 th percentile CPAP 13 cm H2O.  Will have my nurse inform pt that CPAP report looks good.  No change to current set up needed.

## 2013-10-20 ENCOUNTER — Ambulatory Visit: Payer: BC Managed Care – PPO | Admitting: Internal Medicine

## 2013-10-27 ENCOUNTER — Encounter: Payer: Self-pay | Admitting: Internal Medicine

## 2013-10-27 ENCOUNTER — Ambulatory Visit (INDEPENDENT_AMBULATORY_CARE_PROVIDER_SITE_OTHER): Payer: BC Managed Care – PPO | Admitting: Internal Medicine

## 2013-10-27 VITALS — BP 114/58 | HR 71 | Temp 98.6°F | Resp 16 | Wt 226.1 lb

## 2013-10-27 DIAGNOSIS — M25569 Pain in unspecified knee: Secondary | ICD-10-CM

## 2013-10-27 DIAGNOSIS — G8929 Other chronic pain: Secondary | ICD-10-CM

## 2013-10-27 DIAGNOSIS — K7689 Other specified diseases of liver: Secondary | ICD-10-CM

## 2013-10-27 DIAGNOSIS — K760 Fatty (change of) liver, not elsewhere classified: Secondary | ICD-10-CM

## 2013-10-27 DIAGNOSIS — E785 Hyperlipidemia, unspecified: Secondary | ICD-10-CM

## 2013-10-27 DIAGNOSIS — E119 Type 2 diabetes mellitus without complications: Secondary | ICD-10-CM

## 2013-10-27 DIAGNOSIS — R1013 Epigastric pain: Secondary | ICD-10-CM

## 2013-10-27 DIAGNOSIS — E669 Obesity, unspecified: Secondary | ICD-10-CM

## 2013-10-27 DIAGNOSIS — F411 Generalized anxiety disorder: Secondary | ICD-10-CM

## 2013-10-27 DIAGNOSIS — G4733 Obstructive sleep apnea (adult) (pediatric): Secondary | ICD-10-CM

## 2013-10-27 DIAGNOSIS — E039 Hypothyroidism, unspecified: Secondary | ICD-10-CM

## 2013-10-27 DIAGNOSIS — M25561 Pain in right knee: Secondary | ICD-10-CM

## 2013-10-27 DIAGNOSIS — E1169 Type 2 diabetes mellitus with other specified complication: Secondary | ICD-10-CM

## 2013-10-27 DIAGNOSIS — R110 Nausea: Secondary | ICD-10-CM

## 2013-10-27 LAB — HM DIABETES FOOT EXAM: HM DIABETIC FOOT EXAM: NORMAL

## 2013-10-27 MED ORDER — HYDROCODONE-ACETAMINOPHEN 7.5-325 MG PO TABS: 1.0000 | ORAL_TABLET | Freq: Four times a day (QID) | ORAL | Status: DC | PRN

## 2013-10-27 MED ORDER — ALPRAZOLAM 0.5 MG PO TABS: ORAL_TABLET | ORAL | Status: DC

## 2013-10-27 MED ORDER — OMEPRAZOLE 40 MG PO CPDR: DELAYED_RELEASE_CAPSULE | ORAL | Status: DC

## 2013-10-27 NOTE — Progress Notes (Addendum)
Patient ID: [name], male   DOB: 1966/01/14, 48 y.o.   MRN: [id number]   Patient Active Problem List   Diagnosis Date Noted  . Obesity, unspecified 10/29/2013  . Chronic knee pain 10/29/2013  . Umbilical hernia 02/54/2706  . Diabetes mellitus type 2 in obese 10/23/2012  . Seborrheic dermatitis, unspecified 05/17/2012  . Fatty liver 04/22/2012  . Polyarthralgia 12/24/2010  . Sciatica of right side 12/24/2010  . HYPERLIPIDEMIA 01/13/2009  . ANXIETY DEPRESSION 01/13/2009  . HYPERTENSION 01/13/2009  . OSA (obstructive sleep apnea) 01/13/2009    Subjective:  CC:   No chief complaint on file.   HPI:   [name] is a 48 y.o. male who presents for 3 month follow up on uncontrolled DM, fatty liver, hyperlipidemia  His knee pain is worse.  He has Has seen 2 orthopedic surgeons about his pain s/p right knee replacement history and underwent a Bone scan which suggested he may be loosening hardware  He has been checking his sugars aboutthree tines per week  87 today at 3 pm   Am fasting 5:20 `124,  119    Past Medical History  Diagnosis Date  . Hypertension     essential, benign  . Anxiety and depression   . Chest pain     hx  . Sleep apnea   . HLD (hyperlipidemia)   . Palpitation     hx  . Other chest pain     tightness, pressure  . Precordial pain   . Murmur   . Bruit     L  . Impotence of organic origin   . Edema   . Goiter     Past Surgical History  Procedure Laterality Date  . Knee arthroscopy    . Appendectomy  2002    done at [facility]  . Total knee arthroplasty      right  . Hernia repair  2-37-62    umbilical       The following portions of the patient's history were reviewed and updated as appropriate: Allergies, current medications, and problem list.    Review of Systems:   Patient denies headache, fevers, malaise, unintentional weight loss, skin rash, eye pain, sinus congestion and sinus pain, sore throat, dysphagia,  hemoptysis ,  cough, dyspnea, wheezing, chest pain, palpitations, orthopnea, edema, abdominal pain, nausea, melena, diarrhea, constipation, flank pain, dysuria, hematuria, urinary  Frequency, nocturia, numbness, tingling, seizures,  Focal weakness, Loss of consciousness,  Tremor, insomnia, depression, anxiety, and suicidal ideation.     History   Social History  . Marital Status: Married    Spouse Name: N/A    Number of Children: 2  . Years of Education: N/A   Occupational History  . Maintenance supervisor   . SERVICE DIRECTOR    Social History Main Topics  . Smoking status: Former Smoker -- 2.00 packs/day for 18 years    Types: Cigarettes    Quit date: 04/24/2005  . Smokeless tobacco: Never Used     Comment: quit 20 years ago   . Alcohol Use: Yes     Comment: 1 a day  . Drug Use: No  . Sexual Activity: Not on file   Other Topics Concern  . Not on file   Social History Narrative   Married, gets regular exercise.     Objective:  Filed Vitals:   10/27/13 1621  BP: 114/58  Pulse: 71  Temp: 98.6 F (37 C)  Resp: 16     General  appearance: alert, cooperative and appears stated age Ears: normal TM's and external ear canals both ears Throat: lips, mucosa, and tongue normal; teeth and gums normal Neck: no adenopathy, no carotid bruit, supple, symmetrical, trachea midline and thyroid not enlarged, symmetric, no tenderness/mass/nodules Back: symmetric, no curvature. ROM normal. No CVA tenderness. Lungs: clear to auscultation bilaterally Heart: regular rate and rhythm, S1, S2 normal, no murmur, click, rub or gallop Abdomen: soft, non-tender; bowel sounds normal; no masses,  no organomegaly Pulses: 2+ and symmetric Skin: Skin color, texture, turgor normal. No rashes or lesions Lymph nodes: Cervical, supraclavicular, and axillary nodes normal.  Assessment and Plan:  OSA (obstructive sleep apnea) He is wearing CPAP a minimum of 6 hours per night     Diabetes mellitus type 2 in  obese Well controlled. He has been modifying his diet and check BS rarely .  Reminder for annual diabetic eye exam given..  Foot exam done. Meds reviewed and he is on a baby aspirin daily,  No statin per patient preference and an ACE inhibitor.  Urine to be tested   for protein.   Lab Results  Component Value Date   HGBA1C 6.3 10/27/2013   Lab Results  Component Value Date   MICROALBUR 0.6 10/27/2013       Fatty liver  Based on abdominal ultrasound, new onset DM Type 2 and serologies negative for other causes of hepatitis.   Discussed  the long term consequences including cirrhosis,  as well as management with weight loss of 24 lbs (10%) and control of hyperlipidemia, and  DM.  He is following a lower glycemic index and was referred to Deatra James at [facility] for definitive diagnosis with liver biopsy  Which was done in October but results are not reviewable  Despite ue of EPIC portal.          Obesity, unspecified I have congratulated him in reduction of   BMI and encouraged  Continued weight loss with goal of 10% of body weigh over the next 6 months using a low glycemic index diet and regular exercise a minimum of 5 days per week.    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  I would like to repeat them in 3 months with your next A1c,  If they are still > 300,  I will recommend starting fenofibrate to lower them.  Remember that excessive carbohydrates (including excessive alcohol),  Too many starches,  and lack of regular exercise are all associated with elevated triglycerides, so modifying any of these factors will help.    Updated Medication List Outpatient Encounter Prescriptions as of 10/27/2013  Medication Sig  . ALPRAZolam (XANAX) 0.5 MG tablet TAKE 1 TABLET BY MOUTH AT BEDTIME AS NEEDED  . amoxicillin (AMOXIL) 500 MG capsule Take  500 mg by mouth. Take 4 capsules prior to dental procedures.  Marland Kitchen aspirin 81 MG EC tablet Take 81 mg by mouth daily.    Marland Kitchen BAYER MICROLET LANCETS lancets Use as instructed  . buPROPion (WELLBUTRIN SR) 150 MG 12 hr tablet Take 1 tablet (150 mg total) by mouth 2 (two) times daily.  . citalopram (CELEXA) 20 MG tablet TAKE 1 TABLET BY MOUTH DAILY.  . clidinium-chlordiazePOXIDE (LIBRAX) 5-2.5 MG per capsule TAKE ONE CAPSULE BY MOUTH 3 TIMES A DAY BEFORE MEALS  . HYDROcodone-acetaminophen (NORCO) 7.5-325 MG per tablet Take 1 tablet  by mouth every 6 (six) hours as needed for moderate pain.  Marland Kitchen ibuprofen (ADVIL,MOTRIN) 200 MG tablet Take 400 mg by mouth every 6 (six) hours as needed.  Marland Kitchen levothyroxine (SYNTHROID, LEVOTHROID) 88 MCG tablet Take 1 tablet (88 mcg total) by mouth daily before breakfast.  . lisinopril-hydrochlorothiazide (PRINZIDE,ZESTORETIC) 20-25 MG per tablet Take 1 tablet by mouth daily.  Marland Kitchen lisinopril-hydrochlorothiazide (PRINZIDE,ZESTORETIC) 20-25 MG per tablet TAKE 1 TABLET BY MOUTH EVERY DAY  . methocarbamol (ROBAXIN) 500 MG tablet Take 500 mg by mouth every 8 (eight) hours as needed for muscle spasms.  . methocarbamol (ROBAXIN) 500 MG tablet TAKE 1 TABLET BY MOUTH EVERY 8 HOURS AS NEEDED  . metoprolol tartrate (LOPRESSOR) 25 MG tablet Take 1 tablet (25 mg total) by mouth 2 (two) times daily.  . Multiple Vitamin (MULTIVITAMIN) tablet Take 1 tablet by mouth daily.    Marland Kitchen omeprazole (PRILOSEC) 40 MG capsule Take 1 tab twice daily  . traMADol (ULTRAM) 50 MG tablet Take 50 mg by mouth every 6 (six) hours as needed for moderate pain.  . [DISCONTINUED] ALPRAZolam (XANAX) 0.5 MG tablet TAKE 1 TABLET BY MOUTH AT BEDTIME AS NEEDED  . [DISCONTINUED] HYDROcodone-acetaminophen (NORCO) 7.5-325 MG per tablet Take 1 tablet by mouth every 6 (six) hours as needed for moderate pain.  . [DISCONTINUED] omeprazole (PRILOSEC) 40 MG capsule Take 1 tab twice daily for one month.     Orders Placed This Encounter   Procedures  . Comprehensive metabolic panel  . Lipid panel  . Microalbumin / creatinine urine ratio  . TSH  . Hemoglobin A1c  . Microalbumin / creatinine urine ratio  . Lipid panel  . Comprehensive metabolic panel  . Hemoglobin A1c  . HM DIABETES EYE EXAM  . HM DIABETES FOOT EXAM    Return in about 3 months (around 01/27/2014) for follow up diabetes.

## 2013-10-27 NOTE — Progress Notes (Signed)
Pre visit review using our clinic review tool, if applicable. No additional management support is needed unless otherwise documented below in the visit note. 

## 2013-10-27 NOTE — Patient Instructions (Signed)
You should try to reduce your prilosec to once daily .Marland Kitchen It is not good to stay on twice daily dosing long term   Good job on the weight loss!!  Your goal is 23 lbs over the next 6 to 12 months  Try substituting the following to help cut calories and carbs from your diet :  [employer] low carb pita breads and flat breads ([employer] and [employer])  Tastes better toasted!!  Blue Diamond Unsweetened almond /coconut  Milk  45 cal   1 carb    Dannon Lt n fit greek Yogurt

## 2013-10-28 LAB — COMPREHENSIVE METABOLIC PANEL
ALK PHOS: 79 U/L (ref 39–117)
ALT: 26 U/L (ref 0–53)
AST: 28 U/L (ref 0–37)
Albumin: 4.3 g/dL (ref 3.5–5.2)
BILIRUBIN TOTAL: 0.7 mg/dL (ref 0.2–1.2)
BUN: 16 mg/dL (ref 6–23)
CO2: 32 mEq/L (ref 19–32)
Calcium: 9.8 mg/dL (ref 8.4–10.5)
Chloride: 97 mEq/L (ref 96–112)
Creatinine, Ser: 1 mg/dL (ref 0.4–1.5)
GFR: 86.41 mL/min (ref >60.00)
GLUCOSE: 98 mg/dL (ref 70–99)
Potassium: 4.4 mEq/L (ref 3.5–5.1)
SODIUM: 135 meq/L (ref 135–145)
TOTAL PROTEIN: 7 g/dL (ref 6.0–8.3)

## 2013-10-28 LAB — LIPID PANEL
CHOL/HDL RATIO: 7
CHOLESTEROL: 223 mg/dL — AB (ref 0–200)
HDL: 32.4 mg/dL — ABNORMAL LOW (ref >39.00)
LDL Cholesterol: 129 mg/dL — ABNORMAL HIGH (ref 0–99)
NonHDL: 190.6
Triglycerides: 307 mg/dL — ABNORMAL HIGH (ref 0.0–149.0)
VLDL: 61.4 mg/dL — AB (ref 0.0–40.0)

## 2013-10-28 LAB — HEMOGLOBIN A1C: Hgb A1c MFr Bld: 6.3 % (ref 4.6–6.5)

## 2013-10-28 LAB — MICROALBUMIN / CREATININE URINE RATIO
Creatinine,U: 56.7 mg/dL
Microalb Creat Ratio: 1.1 mg/g (ref 0.0–30.0)
Microalb, Ur: 0.6 mg/dL (ref 0.0–1.9)

## 2013-10-28 LAB — TSH: TSH: 2.58 u[IU]/mL (ref 0.35–4.50)

## 2013-10-29 DIAGNOSIS — M25569 Pain in unspecified knee: Secondary | ICD-10-CM

## 2013-10-29 DIAGNOSIS — G8929 Other chronic pain: Secondary | ICD-10-CM | POA: Insufficient documentation

## 2013-10-29 DIAGNOSIS — E663 Overweight: Secondary | ICD-10-CM | POA: Insufficient documentation

## 2013-10-29 NOTE — Assessment & Plan Note (Signed)
I have congratulated him in reduction of   BMI and encouraged  Continued weight loss with goal of 10% of body weigh over the next 6 months using a low glycemic index diet and regular exercise a minimum of 5 days per week.   

## 2013-10-29 NOTE — Assessment & Plan Note (Signed)
Well controlled. He has been modifying his diet and check BS rarely .  Reminder for annual diabetic eye exam given..  Foot exam done. Meds reviewed and he is on a baby aspirin daily,  No statin per patient preference and an ACE inhibitor.  Urine to be tested   for protein.   Lab Results  Component Value Date   HGBA1C 6.3 10/27/2013   Lab Results  Component Value Date   MICROALBUR 0.6 10/27/2013

## 2013-10-29 NOTE — Assessment & Plan Note (Signed)
Based on abdominal ultrasound, new onset DM Type 2 and serologies negative for other causes of hepatitis.   Discussed  the long term consequences including cirrhosis,  as well as management with weight loss of 24 lbs (10%) and control of hyperlipidemia, and  DM.  He is following a lower glycemic index and was referred to Deatra James at [facility] for definitive diagnosis with liver biopsy  Which was done in October but results are not reviewable  Despite ue of EPIC portal.

## 2013-10-29 NOTE — Assessment & Plan Note (Signed)
He is wearing CPAP a minimum of 6 hours per night

## 2013-10-29 NOTE — Assessment & Plan Note (Signed)
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

## 2013-10-30 ENCOUNTER — Encounter: Payer: Self-pay | Admitting: Internal Medicine

## 2013-11-02 ENCOUNTER — Encounter: Payer: Self-pay | Admitting: Internal Medicine

## 2013-11-02 NOTE — Addendum Note (Signed)
Addended by: [name] on: 11/02/2013 09:34 AM   Modules accepted: Orders

## 2013-11-02 NOTE — Assessment & Plan Note (Signed)
diabetes is now under excellent control good control but triglycerides remain  > 300.  I would like to repeat them in 3 months with your next A1c,  If they are still > 300,  I will recommend starting fenofibrate to lower them.  Remember that excessive carbohydrates (including excessive alcohol),  Too many starches,  and lack of regular exercise are all associated with elevated triglycerides, so modifying any of these factors will help.

## 2013-11-06 NOTE — Telephone Encounter (Signed)
Mailed unread message to pt  

## 2013-11-10 ENCOUNTER — Other Ambulatory Visit: Payer: Self-pay | Admitting: Internal Medicine

## 2013-11-10 NOTE — Telephone Encounter (Signed)
Refill

## 2013-11-12 NOTE — Telephone Encounter (Signed)
Ok to refill,  Refill sent  

## 2013-11-14 ENCOUNTER — Encounter: Payer: Self-pay | Admitting: *Deleted

## 2013-11-14 NOTE — Progress Notes (Signed)
Chart reviewed for DM bundle. Last OV 10/27/13, appt sch 10/15. BP within parameters at last check

## 2013-11-19 ENCOUNTER — Other Ambulatory Visit: Payer: Self-pay | Admitting: Orthopaedic Surgery

## 2013-11-19 DIAGNOSIS — M542 Cervicalgia: Secondary | ICD-10-CM

## 2013-11-26 ENCOUNTER — Ambulatory Visit
Admission: RE | Admit: 2013-11-26 | Discharge: 2013-11-26 | Disposition: A | Discharge status: Home / Self Care | Payer: BC Managed Care – PPO | Source: Ambulatory Visit | Attending: Orthopaedic Surgery | Admitting: Orthopaedic Surgery

## 2013-11-26 DIAGNOSIS — M542 Cervicalgia: Secondary | ICD-10-CM

## 2013-12-08 ENCOUNTER — Telehealth: Payer: Self-pay | Admitting: Internal Medicine

## 2013-12-08 DIAGNOSIS — G8929 Other chronic pain: Secondary | ICD-10-CM

## 2013-12-08 DIAGNOSIS — M25561 Pain in right knee: Principal | ICD-10-CM

## 2013-12-08 MED ORDER — HYDROCODONE-ACETAMINOPHEN 7.5-325 MG PO TABS: 1.0000 | ORAL_TABLET | Freq: Four times a day (QID) | ORAL | Status: DC | PRN

## 2013-12-08 NOTE — Telephone Encounter (Signed)
Ok to fill?  Last fill 10/27/13

## 2013-12-08 NOTE — Telephone Encounter (Signed)
Pt needs a refill of hydrocodone. °

## 2013-12-08 NOTE — Telephone Encounter (Signed)
Ok to refill,  printed rx  

## 2013-12-09 NOTE — Telephone Encounter (Signed)
Patient aware script ready for pick up placed  At front desk.

## 2014-01-12 ENCOUNTER — Other Ambulatory Visit: Payer: Self-pay | Admitting: *Deleted

## 2014-01-12 DIAGNOSIS — R110 Nausea: Secondary | ICD-10-CM

## 2014-01-12 DIAGNOSIS — R1013 Epigastric pain: Secondary | ICD-10-CM

## 2014-01-12 NOTE — Telephone Encounter (Signed)
error 

## 2014-01-16 ENCOUNTER — Telehealth: Payer: Self-pay | Admitting: Internal Medicine

## 2014-01-16 DIAGNOSIS — M25561 Pain in right knee: Principal | ICD-10-CM

## 2014-01-16 DIAGNOSIS — G8929 Other chronic pain: Secondary | ICD-10-CM

## 2014-01-16 MED ORDER — HYDROCODONE-ACETAMINOPHEN 7.5-325 MG PO TABS: 1.0000 | ORAL_TABLET | Freq: Four times a day (QID) | ORAL | Status: DC | PRN

## 2014-01-16 NOTE — Telephone Encounter (Signed)
Last refill #120 8.17.15, last OV 7.6.15.  Please advise refill

## 2014-01-16 NOTE — Telephone Encounter (Signed)
He can pick up on Monday

## 2014-01-16 NOTE — Telephone Encounter (Signed)
HYDROcodone-acetaminophen (NORCO) 7.5-325 MG per tablet

## 2014-01-19 MED ORDER — HYDROCODONE-ACETAMINOPHEN 7.5-325 MG PO TABS: 1.0000 | ORAL_TABLET | Freq: Four times a day (QID) | ORAL | Status: DC | PRN

## 2014-01-19 NOTE — Telephone Encounter (Signed)
Left message, notifying Rx ready for pickup

## 2014-01-19 NOTE — Telephone Encounter (Signed)
Reprinted for Dr. Derrel Nip script not on printer.,

## 2014-01-21 ENCOUNTER — Other Ambulatory Visit: Payer: Self-pay | Admitting: Internal Medicine

## 2014-01-21 NOTE — Telephone Encounter (Signed)
Last visit 10/27/13, ok refill?

## 2014-01-21 NOTE — Telephone Encounter (Signed)
Ok to refill,  Refill sent  

## 2014-01-26 ENCOUNTER — Ambulatory Visit: Payer: BC Managed Care – PPO | Admitting: Internal Medicine

## 2014-02-08 ENCOUNTER — Other Ambulatory Visit: Payer: Self-pay | Admitting: Internal Medicine

## 2014-02-10 ENCOUNTER — Telehealth: Payer: Self-pay | Admitting: *Deleted

## 2014-02-10 NOTE — Telephone Encounter (Signed)
Too soon,  Cannot refill until oct 28

## 2014-02-10 NOTE — Telephone Encounter (Signed)
Spoke with pt advised of MDs message.  Pt states he will request refill at appoint on 10.26.15.

## 2014-02-10 NOTE — Telephone Encounter (Signed)
Pt called requesting Hydrocodone refill.  Last appoint 7.6.15, last refill 9.28.15 next appoint 10.26.15.  Please advise refill

## 2014-02-16 ENCOUNTER — Ambulatory Visit (INDEPENDENT_AMBULATORY_CARE_PROVIDER_SITE_OTHER): Payer: BC Managed Care – PPO | Admitting: Internal Medicine

## 2014-02-16 ENCOUNTER — Encounter: Payer: Self-pay | Admitting: Internal Medicine

## 2014-02-16 VITALS — BP 144/82 | HR 65 | Temp 97.9°F | Resp 16 | Ht 72.0 in | Wt 226.0 lb

## 2014-02-16 DIAGNOSIS — I10 Essential (primary) hypertension: Secondary | ICD-10-CM

## 2014-02-16 DIAGNOSIS — E119 Type 2 diabetes mellitus without complications: Secondary | ICD-10-CM

## 2014-02-16 DIAGNOSIS — M5431 Sciatica, right side: Secondary | ICD-10-CM

## 2014-02-16 DIAGNOSIS — Z23 Encounter for immunization: Secondary | ICD-10-CM

## 2014-02-16 DIAGNOSIS — M792 Neuralgia and neuritis, unspecified: Secondary | ICD-10-CM

## 2014-02-16 DIAGNOSIS — E1169 Type 2 diabetes mellitus with other specified complication: Secondary | ICD-10-CM

## 2014-02-16 DIAGNOSIS — G8929 Other chronic pain: Secondary | ICD-10-CM

## 2014-02-16 DIAGNOSIS — M25561 Pain in right knee: Secondary | ICD-10-CM

## 2014-02-16 DIAGNOSIS — G4733 Obstructive sleep apnea (adult) (pediatric): Secondary | ICD-10-CM

## 2014-02-16 DIAGNOSIS — E669 Obesity, unspecified: Secondary | ICD-10-CM

## 2014-02-16 MED ORDER — HYDROCODONE-ACETAMINOPHEN 7.5-325 MG PO TABS: 1.0000 | ORAL_TABLET | Freq: Four times a day (QID) | ORAL | Status: DC | PRN

## 2014-02-16 NOTE — Patient Instructions (Signed)
Your neck and arm pai n is coming from degenerative changes in your cervical spine that are pushing on the nerve roots that supply your shoulder and arm  I have refilled your vicodin for 3 months  Please return for fasting labs when you can .  You should get the pneumonia vaccine when you come for the labs

## 2014-02-16 NOTE — Progress Notes (Signed)
Pre-visit discussion using our clinic review tool. No additional management support is needed unless otherwise documented below in the visit note.  

## 2014-02-16 NOTE — Progress Notes (Signed)
Patient ID: [name], male   DOB: Feb 13, 1966, 48 y.o.   MRN: [id number]  Patient Active Problem List   Diagnosis Date Noted  . Radicular pain in left arm 02/17/2014  . Obesity, unspecified 10/29/2013  . Chronic knee pain 10/29/2013  . Umbilical hernia 55/73/2202  . Diabetes mellitus type 2 in obese 10/23/2012  . Seborrheic dermatitis, unspecified 05/17/2012  . Fatty liver 04/22/2012  . Polyarthralgia 12/24/2010  . Sciatica of right side 12/24/2010  . HYPERLIPIDEMIA 01/13/2009  . ANXIETY DEPRESSION 01/13/2009  . Essential hypertension 01/13/2009  . OSA (obstructive sleep apnea) 01/13/2009    Subjective:  CC:   Chief Complaint  Patient presents with  . Follow-up    3 month follow up  . Diabetes    HPI:   [name] is a 48 y.o. male who presents for Follow up on multiple issues,  Including diet controlled DM complicated by obesity and chronic pain, hypertension, hyperlipidemia and fatty liver.  He continues to require use of 4 vicodin daily to manage pain from multiple orthopedic issues including right knee DJD and sciatica. Apparently was treated in an ER in August for left arm pain an d numbness extending to fingers of left hand.  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   His anxiety and fatigue are substantial.  Wife [name] still struggling with life threatening cardiomyopathy,  On the transplant list, but stable s/p placement of LVAD by [facility].  Patient is proud of her being featured in a 3 minute video,  Pulled it up on his cell phone to play it for me today   Family has gone through bankruptcy .  He is still working hard to retain the family house.   Right knee pain is persistent, prosthessis may have loosened m  Suggested by recent bone scan  ordered by [facility] during second opinion workup of  persistent pain post arthroplasty.   Checks his blood sugars periodically ,  All under 130 fasting and under 150 post prandially.    Past Medical History  Diagnosis Date  . Hypertension     essential, benign  . Anxiety and depression   . Chest pain     hx  . Sleep apnea   . HLD (hyperlipidemia)   . Palpitation     hx  . Other chest pain     tightness, pressure  . Precordial pain   . Murmur   . Bruit     L  . Impotence of organic origin   . Edema   . Goiter   . Diabetes mellitus without complication   . Anxiety     Past Surgical History  Procedure Laterality Date  . Knee arthroscopy    . Appendectomy  2002    done at [facility]  . Total knee arthroplasty      right  . Hernia repair  [id number]    umbilical  . Joint replacement Right     right knee       The following portions of the patient's history were reviewed and updated as appropriate: Allergies, current medications, and problem list.    Review of Systems:   Patient denies headache, fevers, malaise, unintentional weight loss, skin rash, eye pain, sinus congestion and sinus pain, sore throat, dysphagia,  hemoptysis , cough, dyspnea, wheezing,  chest pain, palpitations, orthopnea, edema, abdominal pain, nausea, melena, diarrhea, constipation, flank pain, dysuria, hematuria, urinary  Frequency, nocturia, numbness, tingling, seizures,  Focal weakness, Loss of consciousness,  Tremor, insomnia, depression, anxiety, and suicidal ideation.     History   Social History  . Marital Status: Married    Spouse Name: N/A    Number of Children: 2  . Years of Education: N/A   Occupational History  . Maintenance supervisor   . SERVICE DIRECTOR    Social History Main Topics  . Smoking status: Former Smoker -- 2.00 packs/day for 18 years    Types: Cigarettes    Quit date: 04/24/2005  . Smokeless tobacco: Never Used     Comment: quit 20 years ago   . Alcohol Use: 16.0 oz/week    14 Drinks containing 0.5 oz of alcohol, 3  Shots of liquor, 12 Cans of beer per week     Comment: 1 a day  . Drug Use: No  . Sexual Activity: Yes    Birth Control/ Protection: Post-menopausal   Other Topics Concern  . Not on file   Social History Narrative   Married, gets regular exercise.     Objective:  Filed Vitals:   02/16/14 1746  BP: 144/82  Pulse: 65  Temp: 97.9 F (36.6 C)  Resp: 16     General appearance: alert, cooperative and appears stated age Ears: normal TM's and external ear canals both ears Throat: lips, mucosa, and tongue normal; teeth and gums normal Neck: no adenopathy, no carotid bruit, supple, symmetrical, trachea midline and thyroid not enlarged, symmetric, no tenderness/mass/nodules Back: symmetric, no curvature. ROM normal. No CVA tenderness. Lungs: clear to auscultation bilaterally Heart: regular rate and rhythm, S1, S2 normal, no murmur, click, rub or gallop Abdomen: soft, non-tender; bowel sounds normal; no masses,  no organomegaly Pulses: 2+ and symmetric Skin: Skin color, texture, turgor normal. No rashes or lesions Lymph nodes: Cervical, supraclavicular, and axillary nodes normal.  Assessment and Plan:  Essential hypertension Elevated today.  Reviewed list of meds, patient is not taking OTC meds that could be causing,. It.  Have asked patient to recheck bp at home a minimum of 5 times over the next 4 weeks and call readings to office for adjustment of medications.    OSA (obstructive sleep apnea) Diagnosed by sleep study. She is wearing her CPAP every night a minimum of 6 hours per night and notes improved daytime wakefulness and decreased fatigue   Diabetes mellitus type 2 in obese Well controlled. He has been modifying his diet and checks BS rarely .  Reminder for annual diabetic eye exam given..  Foot exam done. Meds reviewed and he is on a baby aspirin daily,  No statin per patient preference and an ACE inhibitor.  Urine negative  for protein.   Lab Results  Component Value  Date   HGBA1C 6.3 10/27/2013   Lab Results  Component Value Date   MICROALBUR 0.6 10/27/2013         Sciatica of right side Symptoms of radiculopathy are intermittent,  MRI Lumbar spine done Sept 2012 showed no spinal stenosis or foraminal stenosis.   Continue activity modification,  Weight loss , core strengthening needed and vicodin refilled.    Radicular pain in left arm He has intermittent pain and numbness involving fingers 3,4,5, right hand without loss of strength,  cotntinue gabapentin and vicodin,  No surgery given myultiple financial burdens including recent bankruptcy,   Chronic knee pain Now wearing  brace and external fixator,  continue vicodin  ,  Refills for #120 /month x 3 months given .  Narcotics contract in place.   A total of 30 minutes was spent with patient more than half of which was spent in counseling patient on the above mentioned issues , reviewing and explaining recent labs and imaging studies done, and coordination of care.  Updated Medication List Outpatient Encounter Prescriptions as of 02/16/2014  Medication Sig  . ALPRAZolam (XANAX) 0.5 MG tablet TAKE 1 TABLET BY MOUTH AT BEDTIME AS NEEDED  . aspirin 81 MG EC tablet Take 81 mg by mouth daily.    Marland Kitchen BAYER MICROLET LANCETS lancets Use as instructed  . buPROPion (WELLBUTRIN SR) 150 MG 12 hr tablet Take 1 tablet (150 mg total) by mouth 2 (two) times daily.  . citalopram (CELEXA) 20 MG tablet TAKE 1 TABLET BY MOUTH DAILY.  . clidinium-chlordiazePOXIDE (LIBRAX) 5-2.5 MG per capsule TAKE ONE CAPSULE BY MOUTH 3 TIMES A DAY BEFORE MEALS  . gabapentin (NEURONTIN) 100 MG capsule Take 100 mg by mouth 2 (two) times daily.  Marland Kitchen HYDROcodone-acetaminophen (NORCO) 7.5-325 MG per tablet Take 1 tablet by mouth every 6 (six) hours as needed for moderate pain.  Marland Kitchen ibuprofen (ADVIL,MOTRIN) 200 MG tablet Take 400 mg by mouth every 6 (six) hours as needed.  Marland Kitchen levothyroxine (SYNTHROID, LEVOTHROID) 88 MCG tablet Take 1 tablet (88  mcg total) by mouth daily before breakfast.  . lisinopril-hydrochlorothiazide (PRINZIDE,ZESTORETIC) 20-25 MG per tablet Take 1 tablet by mouth daily.  Marland Kitchen lisinopril-hydrochlorothiazide (PRINZIDE,ZESTORETIC) 20-25 MG per tablet TAKE 1 TABLET BY MOUTH EVERY DAY  . methocarbamol (ROBAXIN) 500 MG tablet TAKE 1 TABLET BY MOUTH EVERY 8 HOURS AS NEEDED  . metoprolol tartrate (LOPRESSOR) 25 MG tablet Take 1 tablet (25 mg total) by mouth 2 (two) times daily.  . Multiple Vitamin (MULTIVITAMIN) tablet Take 1 tablet by mouth daily.    Marland Kitchen omeprazole (PRILOSEC) 40 MG capsule Take 1 tab twice daily  . traMADol (ULTRAM) 50 MG tablet Take 50 mg by mouth every 6 (six) hours as needed for moderate pain.  . [DISCONTINUED] HYDROcodone-acetaminophen (NORCO) 7.5-325 MG per tablet Take 1 tablet by mouth every 6 (six) hours as needed for moderate pain.  . [DISCONTINUED] HYDROcodone-acetaminophen (NORCO) 7.5-325 MG per tablet Take 1 tablet by mouth every 6 (six) hours as needed for moderate pain.  . [DISCONTINUED] HYDROcodone-acetaminophen (NORCO) 7.5-325 MG per tablet Take 1 tablet by mouth every 6 (six) hours as needed for moderate pain.  Marland Kitchen amoxicillin (AMOXIL) 500 MG capsule Take 500 mg by mouth. Take 4 capsules prior to dental procedures.     No orders of the defined types were placed in this encounter.    No Follow-up on file.

## 2014-02-17 ENCOUNTER — Encounter: Payer: Self-pay | Admitting: Internal Medicine

## 2014-02-17 DIAGNOSIS — M47812 Spondylosis without myelopathy or radiculopathy, cervical region: Secondary | ICD-10-CM | POA: Insufficient documentation

## 2014-02-17 NOTE — Assessment & Plan Note (Signed)
Diagnosed by sleep study. She is wearing her CPAP every night a minimum of 6 hours per night and notes improved daytime wakefulness and decreased fatigue  

## 2014-02-17 NOTE — Assessment & Plan Note (Signed)
Now wearing brace and external fixator,  continue vicodin  ,  Refills for #120 /month x 3 months given .  Narcotics contract in place.

## 2014-02-17 NOTE — Assessment & Plan Note (Signed)
He has intermittent pain and numbness involving fingers 3,4,5, right hand without loss of strength,  cotntinue gabapentin and vicodin,  No surgery given myultiple financial burdens including recent bankruptcy,

## 2014-02-17 NOTE — Assessment & Plan Note (Signed)
Well controlled. He has been modifying his diet and checks BS rarely .  Reminder for annual diabetic eye exam given..  Foot exam done. Meds reviewed and he is on a baby aspirin daily,  No statin per patient preference and an ACE inhibitor.  Urine negative  for protein.   Lab Results  Component Value Date   HGBA1C 6.3 10/27/2013   Lab Results  Component Value Date   MICROALBUR 0.6 10/27/2013

## 2014-02-17 NOTE — Assessment & Plan Note (Signed)
Elevated today.  Reviewed list of meds, patient is not taking OTC meds that could be causing,. It.  Have asked patient to recheck bp at home a minimum of 5 times over the next 4 weeks and call readings to office for adjustment of medications.   

## 2014-02-17 NOTE — Assessment & Plan Note (Addendum)
Symptoms of radiculopathy are intermittent,  MRI Lumbar spine done Sept 2012 showed no spinal stenosis or foraminal stenosis.   Continue activity modification,  Weight loss , core strengthening needed and vicodin refilled.

## 2014-03-09 ENCOUNTER — Telehealth: Payer: Self-pay | Admitting: Internal Medicine

## 2014-03-09 DIAGNOSIS — M5432 Sciatica, left side: Secondary | ICD-10-CM

## 2014-03-09 NOTE — Telephone Encounter (Signed)
Having lower back and left leg pain needing an appointment to be referred to a neurologist.

## 2014-03-09 NOTE — Telephone Encounter (Signed)
Is he sure about seeing a neurologist? Why?  A neurologist does not treat sciatica,  But a neurosurgeon does

## 2014-03-10 NOTE — Telephone Encounter (Signed)
Referral is in process as requested to [facility]

## 2014-03-10 NOTE — Telephone Encounter (Signed)
Patient notified

## 2014-03-10 NOTE — Telephone Encounter (Signed)
Spoke with pt.  He states he meant Neurosurgeon.  He has seen Dr Glenna Fellows in [place] before.  Please advise

## 2014-03-11 ENCOUNTER — Other Ambulatory Visit: Payer: Self-pay | Admitting: Physician Assistant

## 2014-03-13 ENCOUNTER — Telehealth: Payer: Self-pay

## 2014-03-13 NOTE — Telephone Encounter (Signed)
The patient called and is hoping to get an update on his referral.

## 2014-04-15 ENCOUNTER — Other Ambulatory Visit (INDEPENDENT_AMBULATORY_CARE_PROVIDER_SITE_OTHER): Payer: BC Managed Care – PPO

## 2014-04-15 ENCOUNTER — Other Ambulatory Visit: Payer: Self-pay | Admitting: Internal Medicine

## 2014-04-15 DIAGNOSIS — E669 Obesity, unspecified: Secondary | ICD-10-CM

## 2014-04-15 DIAGNOSIS — E1169 Type 2 diabetes mellitus with other specified complication: Secondary | ICD-10-CM

## 2014-04-15 DIAGNOSIS — E119 Type 2 diabetes mellitus without complications: Secondary | ICD-10-CM

## 2014-04-15 DIAGNOSIS — K760 Fatty (change of) liver, not elsewhere classified: Secondary | ICD-10-CM

## 2014-04-15 LAB — LIPID PANEL
Cholesterol: 218 mg/dL — ABNORMAL HIGH (ref 0–200)
HDL: 26 mg/dL — ABNORMAL LOW (ref >39.00)
NONHDL: 192
Total CHOL/HDL Ratio: 8
Triglycerides: 212 mg/dL — ABNORMAL HIGH (ref 0.0–149.0)
VLDL: 42.4 mg/dL — ABNORMAL HIGH (ref 0.0–40.0)

## 2014-04-15 LAB — COMPREHENSIVE METABOLIC PANEL
ALBUMIN: 4.2 g/dL (ref 3.5–5.2)
ALT: 21 U/L (ref 0–53)
AST: 21 U/L (ref 0–37)
Alkaline Phosphatase: 76 U/L (ref 39–117)
BUN: 17 mg/dL (ref 6–23)
CHLORIDE: 100 meq/L (ref 96–112)
CO2: 30 meq/L (ref 19–32)
Calcium: 9.2 mg/dL (ref 8.4–10.5)
Creatinine, Ser: 1 mg/dL (ref 0.4–1.5)
GFR: 84.26 mL/min (ref >60.00)
GLUCOSE: 128 mg/dL — AB (ref 70–99)
POTASSIUM: 4.7 meq/L (ref 3.5–5.1)
SODIUM: 137 meq/L (ref 135–145)
Total Bilirubin: 0.7 mg/dL (ref 0.2–1.2)
Total Protein: 7.4 g/dL (ref 6.0–8.3)

## 2014-04-15 LAB — LDL CHOLESTEROL, DIRECT: Direct LDL: 163 mg/dL

## 2014-04-15 LAB — HEMOGLOBIN A1C: HEMOGLOBIN A1C: 6.3 % (ref 4.6–6.5)

## 2014-04-20 ENCOUNTER — Other Ambulatory Visit: Payer: Self-pay | Admitting: *Deleted

## 2014-04-20 ENCOUNTER — Encounter: Payer: Self-pay | Admitting: Internal Medicine

## 2014-04-20 DIAGNOSIS — F411 Generalized anxiety disorder: Secondary | ICD-10-CM

## 2014-04-20 DIAGNOSIS — K219 Gastro-esophageal reflux disease without esophagitis: Secondary | ICD-10-CM | POA: Insufficient documentation

## 2014-04-20 MED ORDER — ALPRAZOLAM 0.5 MG PO TABS: ORAL_TABLET | ORAL | Status: DC

## 2014-04-20 NOTE — Telephone Encounter (Signed)
Last visit 02/16/14

## 2014-04-20 NOTE — Telephone Encounter (Signed)
Ok to refill,  Authorized in epic for phone in  

## 2014-04-20 NOTE — Telephone Encounter (Signed)
Phoned to pharmacy 

## 2014-04-21 ENCOUNTER — Other Ambulatory Visit: Payer: Self-pay | Admitting: Internal Medicine

## 2014-04-21 DIAGNOSIS — M25561 Pain in right knee: Principal | ICD-10-CM

## 2014-04-21 DIAGNOSIS — G8929 Other chronic pain: Secondary | ICD-10-CM

## 2014-04-22 MED ORDER — HYDROCODONE-ACETAMINOPHEN 7.5-325 MG PO TABS: 1.0000 | ORAL_TABLET | Freq: Four times a day (QID) | ORAL | Status: DC | PRN

## 2014-04-22 NOTE — Telephone Encounter (Signed)
Chart reflects that he was given a refill in late October that was dated for refill on Dec 28th.  Please clarify with patient. I cannot give him another for refill for same date so I have printed the one for refill jan 28.  If he states that he lost the other one,  You can reprint but I will no longer print refills for  3 months at a time going forward

## 2014-04-22 NOTE — Telephone Encounter (Signed)
Sent mcyhart for patient to clarify

## 2014-04-22 NOTE — Telephone Encounter (Signed)
Last visit 02/16/14

## 2014-05-02 ENCOUNTER — Other Ambulatory Visit: Payer: Self-pay | Admitting: Internal Medicine

## 2014-05-05 ENCOUNTER — Other Ambulatory Visit (HOSPITAL_COMMUNITY): Payer: Self-pay | Admitting: Orthopedic Surgery

## 2014-05-05 DIAGNOSIS — Z96651 Presence of right artificial knee joint: Secondary | ICD-10-CM

## 2014-05-05 DIAGNOSIS — M25561 Pain in right knee: Secondary | ICD-10-CM

## 2014-05-05 DIAGNOSIS — M5441 Lumbago with sciatica, right side: Secondary | ICD-10-CM

## 2014-05-14 ENCOUNTER — Other Ambulatory Visit: Payer: Self-pay | Admitting: *Deleted

## 2014-05-14 MED ORDER — METOPROLOL TARTRATE 25 MG PO TABS: 25.0000 mg | ORAL_TABLET | Freq: Two times a day (BID) | ORAL | Status: DC

## 2014-05-14 MED ORDER — BUPROPION HCL ER (SR) 150 MG PO TB12: 150.0000 mg | ORAL_TABLET | Freq: Two times a day (BID) | ORAL | Status: DC

## 2014-05-21 ENCOUNTER — Ambulatory Visit (HOSPITAL_COMMUNITY)
Admission: RE | Admit: 2014-05-21 | Discharge: 2014-05-21 | Disposition: A | Discharge status: Home / Self Care | Payer: BLUE CROSS/BLUE SHIELD | Source: Ambulatory Visit | Attending: Orthopedic Surgery | Admitting: Orthopedic Surgery

## 2014-05-21 DIAGNOSIS — M1288 Other specific arthropathies, not elsewhere classified, other specified site: Secondary | ICD-10-CM | POA: Diagnosis not present

## 2014-05-21 DIAGNOSIS — M5441 Lumbago with sciatica, right side: Secondary | ICD-10-CM

## 2014-05-21 DIAGNOSIS — Z96651 Presence of right artificial knee joint: Secondary | ICD-10-CM | POA: Diagnosis not present

## 2014-05-21 DIAGNOSIS — M5126 Other intervertebral disc displacement, lumbar region: Secondary | ICD-10-CM | POA: Insufficient documentation

## 2014-05-21 DIAGNOSIS — M25561 Pain in right knee: Secondary | ICD-10-CM | POA: Diagnosis not present

## 2014-05-21 DIAGNOSIS — M545 Low back pain: Secondary | ICD-10-CM | POA: Insufficient documentation

## 2014-05-21 MED ORDER — TECHNETIUM TC 99M MEDRONATE IV KIT
26.0000 | PACK | Freq: Once | INTRAVENOUS | Status: AC | PRN
  Administered 2014-05-21: 26 via INTRAVENOUS

## 2014-05-31 ENCOUNTER — Other Ambulatory Visit: Payer: Self-pay | Admitting: Internal Medicine

## 2014-06-02 ENCOUNTER — Other Ambulatory Visit (HOSPITAL_COMMUNITY): Payer: Self-pay | Admitting: Orthopedic Surgery

## 2014-06-11 ENCOUNTER — Encounter (HOSPITAL_COMMUNITY): Payer: Self-pay

## 2014-06-11 ENCOUNTER — Ambulatory Visit (HOSPITAL_COMMUNITY)
Admission: RE | Admit: 2014-06-11 | Discharge: 2014-06-11 | Disposition: A | Discharge status: Home / Self Care | Payer: BLUE CROSS/BLUE SHIELD | Source: Ambulatory Visit | Attending: Orthopedic Surgery | Admitting: Orthopedic Surgery

## 2014-06-11 ENCOUNTER — Encounter (HOSPITAL_COMMUNITY)
Admission: RE | Admit: 2014-06-11 | Discharge: 2014-06-11 | Disposition: A | Discharge status: Home / Self Care | Payer: BLUE CROSS/BLUE SHIELD | Source: Ambulatory Visit | Attending: Orthopedic Surgery | Admitting: Orthopedic Surgery

## 2014-06-11 DIAGNOSIS — Z01818 Encounter for other preprocedural examination: Secondary | ICD-10-CM | POA: Diagnosis present

## 2014-06-11 DIAGNOSIS — Z01812 Encounter for preprocedural laboratory examination: Secondary | ICD-10-CM | POA: Insufficient documentation

## 2014-06-11 DIAGNOSIS — I10 Essential (primary) hypertension: Secondary | ICD-10-CM | POA: Diagnosis not present

## 2014-06-11 DIAGNOSIS — Z01810 Encounter for preprocedural cardiovascular examination: Secondary | ICD-10-CM | POA: Diagnosis not present

## 2014-06-11 HISTORY — DX: Hypothyroidism, unspecified: E03.9

## 2014-06-11 HISTORY — DX: Fatty (change of) liver, not elsewhere classified: K76.0

## 2014-06-11 HISTORY — DX: Gastro-esophageal reflux disease without esophagitis: K21.9

## 2014-06-11 LAB — SURGICAL PCR SCREEN
MRSA, PCR: NEGATIVE
Staphylococcus aureus: NEGATIVE

## 2014-06-11 LAB — BASIC METABOLIC PANEL
Anion gap: 9 (ref 5–15)
BUN: 23 mg/dL (ref 6–23)
CO2: 26 mmol/L (ref 19–32)
CREATININE: 1.19 mg/dL (ref 0.50–1.35)
Calcium: 9.8 mg/dL (ref 8.4–10.5)
Chloride: 101 mmol/L (ref 96–112)
GFR calc non Af Amer: 70 mL/min — ABNORMAL LOW (ref >90)
GFR, EST AFRICAN AMERICAN: 81 mL/min — AB (ref >90)
Glucose, Bld: 97 mg/dL (ref 70–99)
Potassium: 4.3 mmol/L (ref 3.5–5.1)
Sodium: 136 mmol/L (ref 135–145)

## 2014-06-11 LAB — ABO/RH: ABO/RH(D): A POS

## 2014-06-11 LAB — CBC
HCT: 40.3 % (ref 39.0–52.0)
Hemoglobin: 13.6 g/dL (ref 13.0–17.0)
MCH: 30.7 pg (ref 26.0–34.0)
MCHC: 33.7 g/dL (ref 30.0–36.0)
MCV: 91 fL (ref 78.0–100.0)
PLATELETS: 280 10*3/uL (ref 150–400)
RBC: 4.43 MIL/uL (ref 4.22–5.81)
RDW: 12.8 % (ref 11.5–15.5)
WBC: 5.8 10*3/uL (ref 4.0–10.5)

## 2014-06-11 LAB — URINALYSIS, ROUTINE W REFLEX MICROSCOPIC
Bilirubin Urine: NEGATIVE
GLUCOSE, UA: NEGATIVE mg/dL
Hgb urine dipstick: NEGATIVE
KETONES UR: NEGATIVE mg/dL
Leukocytes, UA: NEGATIVE
NITRITE: NEGATIVE
Protein, ur: NEGATIVE mg/dL
Specific Gravity, Urine: 1.007 (ref 1.005–1.030)
Urobilinogen, UA: 0.2 mg/dL (ref 0.0–1.0)
pH: 5.5 (ref 5.0–8.0)

## 2014-06-11 LAB — TYPE AND SCREEN
ABO/RH(D): A POS
ANTIBODY SCREEN: NEGATIVE

## 2014-06-11 NOTE — Progress Notes (Signed)
Pre-op orders not signed. Called Dr. Randel Pigg office and spoke with receptionist and she will send him a message to sign orders.

## 2014-06-11 NOTE — Pre-Procedure Instructions (Signed)
[name]  06/11/2014   Your procedure is scheduled on:  Tuesday, June 23, 2014 at 12:00 PM.   Report to [facility] Entrance "A" Admitting Office at 10:00 AM.   Call this number if you have problems the morning of surgery: [phone]               Any questions prior to day of surgery, please call [phone] between 8 & 4 PM.   Remember:   Do not eat food or drink liquids after midnight Monday, 06/22/14.   Take these medicines the morning of surgery with A SIP OF WATER: Wellbutrin, Celexa, Gabapentin, Synthroid, Metoprolol, Omeprazole, Hydrocodone or Tramadol - if needed, Methocarbamol - if needed   Stop Aspirin, Vitamins and NSAIDS (Ibuprofen, Aleve, etc.) 7 days prior to surgery.   Do not wear jewelry.  Do not wear lotions, powders, or cologne. You may wear deodorant.  Men may shave face and neck.  Do not bring valuables to the hospital.  [facility] is not responsible                  for any belongings or valuables.               Contacts, dentures or bridgework may not be worn into surgery.  Leave suitcase in the car. After surgery it may be brought to your room.  For patients admitted to the hospital, discharge time is determined by your                treatment team.               Special Instructions: [facility] - Preparing for Surgery  Before surgery, you can play an important role.  Because skin is not sterile, your skin needs to be as free of germs as possible.  You can reduce the number of germs on you skin by washing with CHG (chlorahexidine gluconate) soap before surgery.  CHG is an antiseptic cleaner which kills germs and bonds with the skin to continue killing germs even after washing.  Please DO NOT use if you have an allergy to CHG or antibacterial soaps.  If your skin becomes reddened/irritated stop using the CHG and inform your nurse when you arrive at Short Stay.  Do not shave (including legs and underarms) for at least 48 hours prior to the  first CHG shower.  You may shave your face.  Please follow these instructions carefully:   1.  Shower with CHG Soap the night before surgery and the                                morning of Surgery.  2.  If you choose to wash your hair, wash your hair first as usual with your       normal shampoo.  3.  After you shampoo, rinse your hair and body thoroughly to remove the                      Shampoo.  4.  Use CHG as you would any other liquid soap.  You can apply chg directly       to the skin and wash gently with scrungie or a clean washcloth.  5.  Apply the CHG Soap to your body ONLY FROM THE NECK DOWN.        Do not use on open wounds or  open sores.  Avoid contact with your eyes, ears, mouth and genitals (private parts).  Wash genitals (private parts) with your normal soap.  6.  Wash thoroughly, paying special attention to the area where your surgery        will be performed.  7.  Thoroughly rinse your body with warm water from the neck down.  8.  DO NOT shower/wash with your normal soap after using and rinsing off       the CHG Soap.  9.  Pat yourself dry with a clean towel.            10.  Wear clean pajamas.            11.  Place clean sheets on your bed the night of your first shower and do not        sleep with pets.  Day of Surgery  Do not apply any lotions the morning of surgery.  Please wear clean clothes to the hospital.     Please read over the following fact sheets that you were given: Pain Booklet, Coughing and Deep Breathing, Blood Transfusion Information, MRSA Information and Surgical Site Infection Prevention

## 2014-06-12 NOTE — Progress Notes (Addendum)
Anesthesia Chart Review:  Pt is 50 year old male scheduled for R total knee revision on 06/23/2014 with Dr. Marlou Sa.   PMH: HTN, DM, hyperlipidemia, fatty liver, OSA, palpitation, bruit (not heard on 10/27/2013 pcp visit), goiter, hypothyroidism. Former smoker. BMI 31.  Preoperative labs reviewed.  LFTs normal in 03/2014.   Chest x-ray reviewed. No acute cardiopulmonary disease.  EKG: NSR. Left axis deviation. Nonspecific T wave abnormality. When compared to prior nonspecific T wave changes are new  Echo 10/07/2010: - Left ventricle: The cavity size was normal. Wall thickness was increased in a pattern of mild LVH. Systolic function was normal. The estimated ejection fraction was in the range of 55% to 60%. Wall motion was normal; there were no regional wall motion abnormalities. - Mitral valve: Mild regurgitation. - Left atrium: The atrium was mildly dilated. - Right ventricle: The cavity size was mildly dilated. Systolic function was mildly reduced.  Cardiac cath 01/04/2009: -Normal coronary arteries -Normal LV function with mildly elevated LV end-diastolic pressures.   Discussed case with Dr. Conrad Catawba. Pt will need cardiac clearance prior to surgery. Kim in Dr. Randel Pigg office notified.   Willeen Cass, FNP-BC [facility] Short Stay Surgical [facility]/Anesthesiology Phone: [phone] 06/12/2014 4:32 PM  Addendum: Patient was seen by cardiologist Dr. Haroldine Laws this afternoon.  His note states, "Pre-op CV exam - he has normal coronary artereies by cath in 2010 without any ischemic symptoms since. His ECG is chronically abnormally. He is at low-risk for cardiac complications with knee surgery and can proceed for TKA revision without further cardiac w/u. Given mildly dilated RV on previous echo, at some point should have repeat echo but this no rush as he is completely asx."  George Hugh [facility] Short Stay Center/Anesthesiology Phone [phone] 06/17/2014 6:35 PM

## 2014-06-12 NOTE — ED Notes (Signed)
Pt states he had a cardiac workup several years ago and he was told everything looked ok. He denies any recent chest pain or sob.

## 2014-06-13 LAB — URINE CULTURE
Colony Count: NO GROWTH
Culture: NO GROWTH

## 2014-06-16 ENCOUNTER — Other Ambulatory Visit: Payer: Self-pay | Admitting: Physician Assistant

## 2014-06-17 ENCOUNTER — Ambulatory Visit (HOSPITAL_COMMUNITY)
Admission: RE | Admit: 2014-06-17 | Discharge: 2014-06-17 | Disposition: A | Discharge status: Home / Self Care | Payer: BLUE CROSS/BLUE SHIELD | Source: Ambulatory Visit | Attending: Internal Medicine | Admitting: Internal Medicine

## 2014-06-17 VITALS — BP 140/76 | HR 73 | Wt 226.5 lb

## 2014-06-17 DIAGNOSIS — Z01810 Encounter for preprocedural cardiovascular examination: Secondary | ICD-10-CM | POA: Diagnosis present

## 2014-06-17 DIAGNOSIS — I10 Essential (primary) hypertension: Secondary | ICD-10-CM | POA: Insufficient documentation

## 2014-06-17 DIAGNOSIS — Z791 Long term (current) use of non-steroidal anti-inflammatories (NSAID): Secondary | ICD-10-CM | POA: Insufficient documentation

## 2014-06-17 DIAGNOSIS — Z79891 Long term (current) use of opiate analgesic: Secondary | ICD-10-CM | POA: Insufficient documentation

## 2014-06-17 DIAGNOSIS — R9431 Abnormal electrocardiogram [ECG] [EKG]: Secondary | ICD-10-CM | POA: Diagnosis not present

## 2014-06-17 DIAGNOSIS — E785 Hyperlipidemia, unspecified: Secondary | ICD-10-CM | POA: Diagnosis not present

## 2014-06-17 DIAGNOSIS — Z79899 Other long term (current) drug therapy: Secondary | ICD-10-CM | POA: Insufficient documentation

## 2014-06-17 DIAGNOSIS — Z87891 Personal history of nicotine dependence: Secondary | ICD-10-CM | POA: Insufficient documentation

## 2014-06-17 DIAGNOSIS — I517 Cardiomegaly: Secondary | ICD-10-CM | POA: Diagnosis not present

## 2014-06-17 DIAGNOSIS — Z0000 Encounter for general adult medical examination without abnormal findings: Secondary | ICD-10-CM | POA: Insufficient documentation

## 2014-06-17 DIAGNOSIS — Z8249 Family history of ischemic heart disease and other diseases of the circulatory system: Secondary | ICD-10-CM | POA: Insufficient documentation

## 2014-06-17 DIAGNOSIS — Z7982 Long term (current) use of aspirin: Secondary | ICD-10-CM | POA: Insufficient documentation

## 2014-06-17 NOTE — Progress Notes (Signed)
Patient ID: MADYX [name], male   DOB: Nov 08, 1965, 49 y.o.   MRN: [id number]  HPI:  [name] is a 49 y/o male with HTN and HL. Who is referred for pre-op cardiac evaluation prior to R TKA revision.  Denies h/o known cardiac disease. Has had abnormal T waves on ECG and had normal Myoview in 2009 followed by cath in 9/10 with normal coronaries and EF 65-70%. Echo 2012 with mildly dilated RV. Started on CPAP.    Has done well. No CP. Activity limited by knee pain. He is pending revision of R TKA by Dr. Marlou Sa.     Review of Systems:     Cardiac Review of Systems: {Y] = yes [ ]  = no  Chest Pain [    ]  Resting SOB [   ] Exertional SOB  [  ]  Orthopnea [  ]   Pedal Edema [   ]    Palpitations [  ] Syncope  [  ]   Presyncope [   ]  General Review of Systems: [Y] = yes [  ]=no Constitional: recent weight change [  ]; anorexia [  ]; fatigue [  ]; nausea [  ]; night sweats [  ]; fever [  ]; or chills [  ];                                                                                                                    Dental: poor dentition[  ];   Eye : blurred vision [  ]; diplopia [   ]; vision changes [  ];  Amaurosis fugax[  ]; Resp: cough [  ];  wheezing[  ];  hemoptysis[  ]; shortness of breath[  ]; paroxysmal nocturnal dyspnea[  ]; dyspnea on exertion[  ]; or orthopnea[  ];  GI:  gallstones[  ], vomiting[  ];  dysphagia[  ]; melena[  ];  hematochezia [  ]; heartburn[  ];   Hx of  Colonoscopy[  ]; GU: kidney stones [  ]; hematuria[  ];   dysuria [  ];  nocturia[  ];  history of     obstruction [  ];                 Skin: rash, swelling[  ];, hair loss[  ];  peripheral edema[  ];  or itching[  ]; Musculosketetal: myalgias[  ];  joint swelling[ y ];  joint erythema[  ];  joint pain[y  ];  back pain[  ];  Heme/Lymph: bruising[  ];  bleeding[  ];  anemia[  ];  Neuro: TIA[  ];  headaches[  ];  stroke[  ];  vertigo[  ];  seizures[  ];   paresthesias[  ];  difficulty walking[  ];  Psych:depression[  ];  anxiety[  ];  Endocrine: diabetes[  ];  thyroid dysfunction[  ];  Other:    Past Medical History  Diagnosis Date  . Hypertension     essential,  benign  . Anxiety and depression   . Chest pain     hx  . HLD (hyperlipidemia)   . Palpitation     hx  . Other chest pain     tightness, pressure  . Precordial pain   . Murmur   . Bruit     L  . Impotence of organic origin   . Edema   . Goiter   . Anxiety   . Sleep apnea     uses cpap  . Diabetes mellitus without complication     "borderline"  . Hypothyroidism   . GERD (gastroesophageal reflux disease)     uses Omeprazole  . Fatty liver     Current Outpatient Prescriptions  Medication Sig Dispense Refill  . ALPRAZolam (XANAX) 0.5 MG tablet TAKE 1 TABLET BY MOUTH AT BEDTIME AS NEEDED (Patient taking differently: TAKE 1 TABLET BY MOUTH AT BEDTIME AS NEEDED FOR ANXIETY and in AM if needed) 30 tablet 5  . amoxicillin (AMOXIL) 500 MG capsule Take 500 mg by mouth. Take 4 capsules prior to dental procedures.    Marland Kitchen aspirin 81 MG EC tablet Take 81 mg by mouth daily.      Marland Kitchen BAYER MICROLET LANCETS lancets Use as instructed 100 each 12  . buPROPion (WELLBUTRIN SR) 150 MG 12 hr tablet Take 1 tablet (150 mg total) by mouth 2 (two) times daily. 60 tablet 2  . citalopram (CELEXA) 20 MG tablet TAKE 1 TABLET BY MOUTH DAILY. 30 tablet 4  . clidinium-chlordiazePOXIDE (LIBRAX) 5-2.5 MG per capsule Take 1 capsule by mouth 3 (three) times daily before meals. 90 capsule 0  . gabapentin (NEURONTIN) 100 MG capsule Take 100 mg by mouth 2 (two) times daily.    Marland Kitchen HYDROcodone-acetaminophen (NORCO) 10-325 MG per tablet Take 1 tablet by mouth every 6 (six) hours as needed. for pain  0  . HYDROcodone-acetaminophen (NORCO) 7.5-325 MG per tablet Take 1 tablet by mouth every 6 (six) hours as needed for moderate pain. 120 tablet 0  . ibuprofen (ADVIL,MOTRIN) 200 MG tablet Take 400 mg by mouth every 6 (six) hours as needed for mild pain or moderate pain.     Marland Kitchen  levothyroxine (SYNTHROID, LEVOTHROID) 88 MCG tablet Take 1 tablet (88 mcg total) by mouth daily before breakfast. 30 tablet 10  . lisinopril-hydrochlorothiazide (PRINZIDE,ZESTORETIC) 20-25 MG per tablet Take 1 tablet by mouth daily. 30 tablet 5  . methocarbamol (ROBAXIN) 500 MG tablet TAKE 1 TABLET BY MOUTH EVERY 8 HOURS AS NEEDED (Patient taking differently: TAKE 1 TABLET BY MOUTH EVERY 8 HOURS AS NEEDED FOR MUSCLE SPASMS) 90 tablet 2  . metoprolol tartrate (LOPRESSOR) 25 MG tablet Take 1 tablet (25 mg total) by mouth 2 (two) times daily. 60 tablet 5  . Multiple Vitamin (MULTIVITAMIN) tablet Take 1 tablet by mouth daily.      Marland Kitchen omeprazole (PRILOSEC) 40 MG capsule TAKE ONE CAPSULE BY MOUTH TWICE A DAY 60 capsule 6  . traMADol (ULTRAM) 50 MG tablet Take 50 mg by mouth every 6 (six) hours as needed for moderate pain.     No current facility-administered medications for this encounter.     No Known Allergies  History   Social History  . Marital Status: Married    Spouse Name: N/A  . Number of Children: 2  . Years of Education: N/A   Occupational History  . Maintenance supervisor   . SERVICE DIRECTOR    Social History Main Topics  . Smoking status: Former Smoker --  2.00 packs/day for 18 years    Types: Cigarettes    Quit date: 04/24/2005  . Smokeless tobacco: Never Used     Comment: quit 20 years ago   . Alcohol Use: 16.0 oz/week    12 Cans of beer, 3 Shots of liquor, 14 Standard drinks or equivalent per week     Comment: 1 a day  . Drug Use: No  . Sexual Activity: Yes   Other Topics Concern  . Not on file   Social History Narrative   Married, gets regular exercise.     Family History  Problem Relation Age of Onset  . Heart disease Father     PHYSICAL EXAM: Filed Vitals:   06/17/14 1538  BP: 140/76  Pulse: 73   General:  Well appearing. No respiratory difficulty HEENT: normal Neck: supple. no JVD. Carotids 2+ bilat; no bruits. No lymphadenopathy or thryomegaly  appreciated. Cor: PMI nondisplaced. Regular rate & rhythm. No rubs, gallops or murmurs. Lungs: clear Abdomen: soft, nontender, nondistended. No hepatosplenomegaly. No bruits or masses. Good bowel sounds. Extremities: no cyanosis, clubbing, rash, edema Neuro: alert & oriented x 3, cranial nerves grossly intact. moves all 4 extremities w/o difficulty. Affect pleasant.  ECG: NSR 70 non-specific T wave abnormlaities   No results found for this or any previous visit (from the past 24 hour(s)). No results found.   ASSESSMENT & PLAN: 1. Pre-op CV exam - he has normal coronary artereies by cath in 2010 without any ischemic symptoms since. His ECG is chronically abnormally. He is at low-risk for cardiac complications with knee surgery and can proceed for TKA revision without further cardiac w/u. Given mildly dilated RV on previous echo, at some point should have repeat echo but this no rush as he is completely asx.    Raimundo Corbit,MD 4:18 PM

## 2014-06-18 NOTE — H&P (Signed)
TOTAL KNEE REVISION ADMISSION H&P  Patient is being admitted for right revision total knee arthroplasty.  Subjective:  Chief Complaint:right knee pain.  HPI: [name], 49 y.o. male, has a history of pain and functional disability in the right knee(s) due to arthritis and failed previous arthroplasty and patient has failed non-surgical conservative treatments for greater than 12 weeks to include NSAID's and/or analgesics, flexibility and strengthening excercises, use of assistive devices and activity modification. The indications for the revision of the total knee arthroplasty are loosening of one or more components. Onset of symptoms was gradual starting 5 years ago with gradually worsening course since that time.  Prior procedures on the right knee(s) include arthroplasty.  Patient currently rates pain in the right knee(s) at 8 out of 10 with activity. There is night pain, worsening of pain with activity and weight bearing, pain that interferes with activities of daily living, pain with passive range of motion, crepitus and joint swelling.  Patient has evidence of prosthetic loosening by imaging studies. This condition presents safety issues increasing the risk of falls. This patient has had A workup including negative laboratory and negative aspiration for infection.  There is no current active infection.  Patient Active Problem List   Diagnosis Date Noted  . Pre-operative cardiovascular examination 06/17/2014  . Enlarged RV (right ventricle) 06/17/2014  . Radicular pain in left arm 02/17/2014  . Obesity, unspecified 10/29/2013  . Chronic knee pain 10/29/2013  . Umbilical hernia 23/76/2831  . Diabetes mellitus type 2 in obese 10/23/2012  . Seborrheic dermatitis, unspecified 05/17/2012  . Fatty liver 04/22/2012  . Polyarthralgia 12/24/2010  . Sciatica of right side 12/24/2010  . HYPERLIPIDEMIA 01/13/2009  . ANXIETY DEPRESSION 01/13/2009  . Essential hypertension 01/13/2009  . OSA  (obstructive sleep apnea) 01/13/2009   Past Medical History  Diagnosis Date  . Hypertension     essential, benign  . Anxiety and depression   . Chest pain     hx  . HLD (hyperlipidemia)   . Palpitation     hx  . Other chest pain     tightness, pressure  . Precordial pain   . Murmur   . Bruit     L  . Impotence of organic origin   . Edema   . Goiter   . Anxiety   . Sleep apnea     uses cpap  . Diabetes mellitus without complication     "borderline"  . Hypothyroidism   . GERD (gastroesophageal reflux disease)     uses Omeprazole  . Fatty liver     Past Surgical History  Procedure Laterality Date  . Knee arthroscopy    . Appendectomy  2002    done at [facility]  . Total knee arthroplasty      right  . Hernia repair  09-08-59    umbilical  . Joint replacement Right     right knee  . Tonsillectomy    . Colonoscopy    . Cardiac catheterization      No prescriptions prior to admission   No Known Allergies  History  Substance Use Topics  . Smoking status: Former Smoker -- 2.00 packs/day for 18 years    Types: Cigarettes    Quit date: 04/24/2005  . Smokeless tobacco: Never Used     Comment: quit 20 years ago   . Alcohol Use: 16.0 oz/week    12 Cans of beer, 3 Shots of liquor, 14 Standard drinks or equivalent per week  Comment: 1 a day    Family History  Problem Relation Age of Onset  . Heart disease Father       Review of Systems  Constitutional: Negative.   HENT: Negative.   Eyes: Negative.   Respiratory: Negative.   Cardiovascular: Negative.   Gastrointestinal: Negative.   Genitourinary: Negative.   Musculoskeletal: Positive for joint pain.  Skin: Negative.   Neurological: Negative.   Endo/Heme/Allergies: Negative.   Psychiatric/Behavioral: Negative.      Objective:  Physical Exam  Constitutional: He appears well-developed.  HENT:  Head: Normocephalic.  Eyes: Pupils are equal, round, and reactive to light.  Neck: Normal range of motion.   Cardiovascular: Normal rate.   Respiratory: Effort normal.  Neurological: He is alert.  Skin: Skin is warm.  Psychiatric: He has a normal mood and affect.   examination of the right knee demonstrates well-healed surgical incision mild effusion medial joint line tenderness intact extensor mechanism no groin pain internal extra rotation leg no lymphadenopathy range of motion is from full extension to 110 of flexion there is good patellar stability  Vital signs in last 24 hours:    Labs:  Estimated body mass index is 30.64 kg/(m^2) as calculated from the following:   Height as of 02/16/14: 6' (1.829 m).   Weight as of 02/16/14: 102.513 kg (226 lb).  Imaging Review Plain radiographs demonstrate moderate degenerative joint disease of the right knee(s). The overall alignment is neutral.There is evidence of loosening of the femoral and tibial components. The bone quality appears to be good for age and reported activity level. There is cystic change below the medial aspect of the tibial plateau which is enlarged from prior radiographs loosening is present on bone scan in this area. Clinically the patient has most symptoms in this area as well.  Assessment/Plan:  End stage arthritis, right knee(s) with failed previous arthroplasty.   The patient history, physical examination, clinical judgment of the provider and imaging studies are consistent with end stage degenerative joint disease of the right knee(s), previous total knee arthroplasty. Revision total knee arthroplasty is deemed medically necessary. The treatment options including medical management, injection therapy, arthroscopy and revision arthroplasty were discussed at length. The risks and benefits of revision total knee arthroplasty were presented and reviewed. The risks due to aseptic loosening, infection, stiffness, patella tracking problems, thromboembolic complications and other imponderables were discussed. The patient acknowledged the  explanation, agreed to proceed with the plan and consent was signed. Patient is being admitted for inpatient treatment for surgery, pain control, PT, OT, prophylactic antibiotics, VTE prophylaxis, progressive ambulation and ADL's and discharge planning.The patient is planning to be discharged home with home health services

## 2014-06-22 MED ORDER — CEFAZOLIN SODIUM-DEXTROSE 2-3 GM-% IV SOLR
2.0000 g | INTRAVENOUS | Status: AC
  Administered 2014-06-23 (×2): 2 g via INTRAVENOUS
  Filled 2014-06-22: qty 50

## 2014-06-22 MED ORDER — CHLORHEXIDINE GLUCONATE 4 % EX LIQD
60.0000 mL | Freq: Once | CUTANEOUS | Status: DC
  Filled 2014-06-22: qty 60

## 2014-06-23 ENCOUNTER — Encounter (HOSPITAL_COMMUNITY)
Admission: RE | Disposition: A | Discharge status: Home Health | Payer: Self-pay | Source: Ambulatory Visit | Attending: Orthopedic Surgery

## 2014-06-23 ENCOUNTER — Inpatient Hospital Stay (HOSPITAL_COMMUNITY): Payer: BLUE CROSS/BLUE SHIELD | Admitting: Emergency Medicine

## 2014-06-23 ENCOUNTER — Encounter (HOSPITAL_COMMUNITY): Payer: Self-pay | Admitting: *Deleted

## 2014-06-23 ENCOUNTER — Inpatient Hospital Stay (HOSPITAL_COMMUNITY)
Admission: RE | Admit: 2014-06-23 | Discharge: 2014-06-26 | DRG: 468 | Disposition: A | Discharge status: Home Health | Payer: BLUE CROSS/BLUE SHIELD | Source: Ambulatory Visit | Attending: Orthopedic Surgery | Admitting: Orthopedic Surgery

## 2014-06-23 ENCOUNTER — Inpatient Hospital Stay (HOSPITAL_COMMUNITY): Payer: BLUE CROSS/BLUE SHIELD | Admitting: Certified Registered Nurse Anesthetist

## 2014-06-23 DIAGNOSIS — K219 Gastro-esophageal reflux disease without esophagitis: Secondary | ICD-10-CM | POA: Diagnosis present

## 2014-06-23 DIAGNOSIS — Z87891 Personal history of nicotine dependence: Secondary | ICD-10-CM | POA: Diagnosis not present

## 2014-06-23 DIAGNOSIS — E039 Hypothyroidism, unspecified: Secondary | ICD-10-CM | POA: Diagnosis present

## 2014-06-23 DIAGNOSIS — E669 Obesity, unspecified: Secondary | ICD-10-CM | POA: Diagnosis present

## 2014-06-23 DIAGNOSIS — M179 Osteoarthritis of knee, unspecified: Secondary | ICD-10-CM | POA: Diagnosis present

## 2014-06-23 DIAGNOSIS — E119 Type 2 diabetes mellitus without complications: Secondary | ICD-10-CM | POA: Diagnosis present

## 2014-06-23 DIAGNOSIS — E785 Hyperlipidemia, unspecified: Secondary | ICD-10-CM | POA: Diagnosis present

## 2014-06-23 DIAGNOSIS — K760 Fatty (change of) liver, not elsewhere classified: Secondary | ICD-10-CM | POA: Diagnosis present

## 2014-06-23 DIAGNOSIS — F418 Other specified anxiety disorders: Secondary | ICD-10-CM | POA: Diagnosis present

## 2014-06-23 DIAGNOSIS — G4733 Obstructive sleep apnea (adult) (pediatric): Secondary | ICD-10-CM | POA: Diagnosis present

## 2014-06-23 DIAGNOSIS — I10 Essential (primary) hypertension: Secondary | ICD-10-CM | POA: Diagnosis present

## 2014-06-23 DIAGNOSIS — Y838 Other surgical procedures as the cause of abnormal reaction of the patient, or of later complication, without mention of misadventure at the time of the procedure: Secondary | ICD-10-CM | POA: Diagnosis present

## 2014-06-23 DIAGNOSIS — T84032A Mechanical loosening of internal right knee prosthetic joint, initial encounter: Secondary | ICD-10-CM | POA: Diagnosis present

## 2014-06-23 DIAGNOSIS — M1710 Unilateral primary osteoarthritis, unspecified knee: Secondary | ICD-10-CM | POA: Diagnosis present

## 2014-06-23 DIAGNOSIS — M25561 Pain in right knee: Secondary | ICD-10-CM | POA: Diagnosis present

## 2014-06-23 HISTORY — PX: TOTAL KNEE REVISION: SHX996

## 2014-06-23 HISTORY — PX: REVISION TOTAL KNEE ARTHROPLASTY: SHX767

## 2014-06-23 LAB — GLUCOSE, CAPILLARY
GLUCOSE-CAPILLARY: 114 mg/dL — AB (ref 70–99)
GLUCOSE-CAPILLARY: 126 mg/dL — AB (ref 70–99)
Glucose-Capillary: 114 mg/dL — ABNORMAL HIGH (ref 70–99)
Glucose-Capillary: 94 mg/dL (ref 70–99)

## 2014-06-23 SURGERY — TOTAL KNEE REVISION
Anesthesia: Spinal | Site: Knee | Laterality: Right

## 2014-06-23 MED ORDER — BUPIVACAINE LIPOSOME 1.3 % IJ SUSP
INTRAMUSCULAR | Status: DC | PRN
  Administered 2014-06-23: 20 mL

## 2014-06-23 MED ORDER — PHENOL 1.4 % MT LIQD: 1.0000 | OROMUCOSAL | Status: DC | PRN

## 2014-06-23 MED ORDER — HYDROCHLOROTHIAZIDE 25 MG PO TABS
25.0000 mg | ORAL_TABLET | Freq: Every day | ORAL | Status: DC
  Administered 2014-06-25 – 2014-06-26 (×2): 25 mg via ORAL
  Filled 2014-06-23 (×2): qty 1

## 2014-06-23 MED ORDER — WARFARIN SODIUM 5 MG PO TABS
10.0000 mg | ORAL_TABLET | Freq: Once | ORAL | Status: AC
  Administered 2014-06-23: 10 mg via ORAL
  Filled 2014-06-23: qty 2

## 2014-06-23 MED ORDER — HYDROMORPHONE HCL 1 MG/ML IJ SOLN
INTRAMUSCULAR | Status: AC
  Administered 2014-06-23: 0.5 mg via INTRAVENOUS
  Filled 2014-06-23: qty 1

## 2014-06-23 MED ORDER — MIDAZOLAM HCL 5 MG/5ML IJ SOLN
INTRAMUSCULAR | Status: DC | PRN
  Administered 2014-06-23 (×2): 1 mg via INTRAVENOUS

## 2014-06-23 MED ORDER — ACETAMINOPHEN 650 MG RE SUPP: 650.0000 mg | Freq: Four times a day (QID) | RECTAL | Status: DC | PRN

## 2014-06-23 MED ORDER — METOPROLOL TARTRATE 25 MG PO TABS
25.0000 mg | ORAL_TABLET | Freq: Two times a day (BID) | ORAL | Status: DC
  Administered 2014-06-23 – 2014-06-26 (×5): 25 mg via ORAL
  Filled 2014-06-23 (×7): qty 1

## 2014-06-23 MED ORDER — OXYCODONE HCL 5 MG PO TABS
5.0000 mg | ORAL_TABLET | ORAL | Status: DC | PRN
  Administered 2014-06-23 – 2014-06-25 (×7): 10 mg via ORAL
  Administered 2014-06-25: 5 mg via ORAL
  Administered 2014-06-26: 10 mg via ORAL
  Filled 2014-06-23 (×3): qty 2
  Filled 2014-06-23: qty 1
  Filled 2014-06-23 (×4): qty 2

## 2014-06-23 MED ORDER — METHOCARBAMOL 500 MG PO TABS
500.0000 mg | ORAL_TABLET | Freq: Three times a day (TID) | ORAL | Status: DC | PRN
  Administered 2014-06-23 – 2014-06-24 (×3): 500 mg via ORAL
  Filled 2014-06-23 (×3): qty 1

## 2014-06-23 MED ORDER — METOCLOPRAMIDE HCL 10 MG PO TABS: 5.0000 mg | ORAL_TABLET | Freq: Three times a day (TID) | ORAL | Status: DC | PRN

## 2014-06-23 MED ORDER — 0.9 % SODIUM CHLORIDE (POUR BTL) OPTIME
TOPICAL | Status: DC | PRN
  Administered 2014-06-23 (×4): 1000 mL

## 2014-06-23 MED ORDER — OXYCODONE HCL 5 MG PO TABS
ORAL_TABLET | ORAL | Status: AC
  Administered 2014-06-23: 10 mg via ORAL
  Filled 2014-06-23: qty 2

## 2014-06-23 MED ORDER — PROPOFOL 10 MG/ML IV BOLUS
INTRAVENOUS | Status: AC
  Filled 2014-06-23: qty 20

## 2014-06-23 MED ORDER — ONDANSETRON HCL 4 MG/2ML IJ SOLN
INTRAMUSCULAR | Status: AC
  Filled 2014-06-23: qty 2

## 2014-06-23 MED ORDER — POTASSIUM CHLORIDE IN NACL 20-0.9 MEQ/L-% IV SOLN
INTRAVENOUS | Status: AC
  Administered 2014-06-23: 22:00:00 via INTRAVENOUS
  Filled 2014-06-23 (×2): qty 1000

## 2014-06-23 MED ORDER — CLONIDINE HCL (ANALGESIA) 100 MCG/ML EP SOLN
150.0000 ug | Freq: Once | EPIDURAL | Status: DC
  Filled 2014-06-23: qty 1.5

## 2014-06-23 MED ORDER — BUPROPION HCL ER (SR) 150 MG PO TB12
150.0000 mg | ORAL_TABLET | Freq: Two times a day (BID) | ORAL | Status: DC
  Administered 2014-06-23 – 2014-06-26 (×6): 150 mg via ORAL
  Filled 2014-06-23 (×6): qty 1

## 2014-06-23 MED ORDER — HYDROMORPHONE HCL 1 MG/ML IJ SOLN
0.2500 mg | INTRAMUSCULAR | Status: DC | PRN
  Administered 2014-06-23 (×2): 0.5 mg via INTRAVENOUS

## 2014-06-23 MED ORDER — LACTATED RINGERS IV SOLN
INTRAVENOUS | Status: DC
  Administered 2014-06-23 (×3): via INTRAVENOUS

## 2014-06-23 MED ORDER — METOCLOPRAMIDE HCL 5 MG/ML IJ SOLN: 5.0000 mg | Freq: Three times a day (TID) | INTRAMUSCULAR | Status: DC | PRN

## 2014-06-23 MED ORDER — MIDAZOLAM HCL 2 MG/2ML IJ SOLN
INTRAMUSCULAR | Status: AC
  Filled 2014-06-23: qty 2

## 2014-06-23 MED ORDER — ONDANSETRON HCL 4 MG/2ML IJ SOLN
INTRAMUSCULAR | Status: DC | PRN
  Administered 2014-06-23: 4 mg via INTRAVENOUS

## 2014-06-23 MED ORDER — BUPIVACAINE HCL (PF) 0.25 % IJ SOLN
INTRAMUSCULAR | Status: AC
  Filled 2014-06-23: qty 30

## 2014-06-23 MED ORDER — PHENYLEPHRINE 40 MCG/ML (10ML) SYRINGE FOR IV PUSH (FOR BLOOD PRESSURE SUPPORT)
PREFILLED_SYRINGE | INTRAVENOUS | Status: AC
  Filled 2014-06-23: qty 10

## 2014-06-23 MED ORDER — ONDANSETRON HCL 4 MG/2ML IJ SOLN
4.0000 mg | Freq: Four times a day (QID) | INTRAMUSCULAR | Status: DC | PRN
  Administered 2014-06-24: 4 mg via INTRAVENOUS
  Filled 2014-06-23: qty 2

## 2014-06-23 MED ORDER — LEVOTHYROXINE SODIUM 88 MCG PO TABS
88.0000 ug | ORAL_TABLET | Freq: Every day | ORAL | Status: DC
  Administered 2014-06-24 – 2014-06-25 (×2): 88 ug via ORAL
  Filled 2014-06-23 (×3): qty 1

## 2014-06-23 MED ORDER — ACETAMINOPHEN 325 MG PO TABS
650.0000 mg | ORAL_TABLET | Freq: Four times a day (QID) | ORAL | Status: DC | PRN
  Administered 2014-06-24: 650 mg via ORAL
  Filled 2014-06-23 (×2): qty 2

## 2014-06-23 MED ORDER — ALPRAZOLAM 0.5 MG PO TABS
0.5000 mg | ORAL_TABLET | Freq: Three times a day (TID) | ORAL | Status: DC | PRN
  Administered 2014-06-24 (×3): 0.5 mg via ORAL
  Filled 2014-06-23 (×3): qty 1

## 2014-06-23 MED ORDER — METHOCARBAMOL 500 MG PO TABS
ORAL_TABLET | ORAL | Status: AC
  Filled 2014-06-23: qty 1

## 2014-06-23 MED ORDER — ONDANSETRON HCL 4 MG PO TABS: 4.0000 mg | ORAL_TABLET | Freq: Four times a day (QID) | ORAL | Status: DC | PRN

## 2014-06-23 MED ORDER — WARFARIN VIDEO: Freq: Once | Status: DC

## 2014-06-23 MED ORDER — CEFAZOLIN SODIUM 1-5 GM-% IV SOLN
1.0000 g | Freq: Three times a day (TID) | INTRAVENOUS | Status: AC
  Administered 2014-06-24 (×2): 1 g via INTRAVENOUS
  Filled 2014-06-23 (×2): qty 50

## 2014-06-23 MED ORDER — COUMADIN BOOK
Freq: Once | Status: AC
  Administered 2014-06-24: 02:00:00
  Filled 2014-06-23: qty 1

## 2014-06-23 MED ORDER — MORPHINE SULFATE 4 MG/ML IJ SOLN
INTRAMUSCULAR | Status: AC
  Filled 2014-06-23: qty 2

## 2014-06-23 MED ORDER — FENTANYL CITRATE 0.05 MG/ML IJ SOLN
INTRAMUSCULAR | Status: DC | PRN
  Administered 2014-06-23: 50 ug via INTRAVENOUS

## 2014-06-23 MED ORDER — FENTANYL CITRATE 0.05 MG/ML IJ SOLN
INTRAMUSCULAR | Status: AC
  Filled 2014-06-23: qty 5

## 2014-06-23 MED ORDER — MENTHOL 3 MG MT LOZG
1.0000 | LOZENGE | OROMUCOSAL | Status: DC | PRN
  Filled 2014-06-23: qty 9

## 2014-06-23 MED ORDER — PHENYLEPHRINE HCL 10 MG/ML IJ SOLN
INTRAMUSCULAR | Status: DC | PRN
  Administered 2014-06-23: 40 ug via INTRAVENOUS

## 2014-06-23 MED ORDER — LISINOPRIL 20 MG PO TABS
20.0000 mg | ORAL_TABLET | Freq: Every day | ORAL | Status: DC
  Administered 2014-06-23 – 2014-06-26 (×4): 20 mg via ORAL
  Filled 2014-06-23 (×3): qty 1

## 2014-06-23 MED ORDER — HYDROMORPHONE HCL 1 MG/ML IJ SOLN
1.0000 mg | INTRAMUSCULAR | Status: DC | PRN
  Administered 2014-06-23 – 2014-06-25 (×12): 1 mg via INTRAVENOUS
  Filled 2014-06-23 (×12): qty 1

## 2014-06-23 MED ORDER — BUPIVACAINE LIPOSOME 1.3 % IJ SUSP
20.0000 mL | Freq: Once | INTRAMUSCULAR | Status: DC
  Filled 2014-06-23: qty 20

## 2014-06-23 MED ORDER — SODIUM CHLORIDE 0.9 % IR SOLN
Status: DC | PRN
  Administered 2014-06-23 (×2): 3000 mL

## 2014-06-23 MED ORDER — KETAMINE HCL 100 MG/ML IJ SOLN
INTRAMUSCULAR | Status: DC | PRN
  Administered 2014-06-23 (×4): 10 mg via INTRAVENOUS

## 2014-06-23 MED ORDER — ARTIFICIAL TEARS OP OINT
TOPICAL_OINTMENT | OPHTHALMIC | Status: AC
  Filled 2014-06-23: qty 3.5

## 2014-06-23 MED ORDER — WARFARIN - PHARMACIST DOSING INPATIENT: Freq: Every day | Status: DC

## 2014-06-23 MED ORDER — ADULT MULTIVITAMIN W/MINERALS CH
1.0000 | ORAL_TABLET | Freq: Every day | ORAL | Status: DC
  Administered 2014-06-24 – 2014-06-26 (×3): 1 via ORAL
  Filled 2014-06-23 (×5): qty 1

## 2014-06-23 MED ORDER — TETRACAINE HCL 1 % IJ SOLN
INTRAMUSCULAR | Status: DC | PRN
  Administered 2014-06-23: 15 mg via INTRASPINAL

## 2014-06-23 MED ORDER — BUPIVACAINE-EPINEPHRINE 0.5% -1:200000 IJ SOLN
INTRAMUSCULAR | Status: DC | PRN
Start: 2014-06-23 — End: 2014-06-23
  Administered 2014-06-23: 10 mL

## 2014-06-23 MED ORDER — CEFAZOLIN SODIUM-DEXTROSE 2-3 GM-% IV SOLR
INTRAVENOUS | Status: AC
  Filled 2014-06-23: qty 50

## 2014-06-23 MED ORDER — CILIDINIUM-CHLORDIAZEPOXIDE 2.5-5 MG PO CAPS
1.0000 | ORAL_CAPSULE | Freq: Three times a day (TID) | ORAL | Status: DC
  Administered 2014-06-24 – 2014-06-26 (×6): 1 via ORAL
  Filled 2014-06-23 (×10): qty 1

## 2014-06-23 MED ORDER — STERILE WATER FOR INJECTION IJ SOLN
INTRAMUSCULAR | Status: AC
  Filled 2014-06-23: qty 20

## 2014-06-23 MED ORDER — GABAPENTIN 100 MG PO CAPS
100.0000 mg | ORAL_CAPSULE | Freq: Two times a day (BID) | ORAL | Status: DC
  Administered 2014-06-23 – 2014-06-26 (×6): 100 mg via ORAL
  Filled 2014-06-23 (×6): qty 1

## 2014-06-23 MED ORDER — MORPHINE SULFATE 4 MG/ML IJ SOLN
INTRAMUSCULAR | Status: DC | PRN
  Administered 2014-06-23: 8 mg

## 2014-06-23 MED ORDER — CITALOPRAM HYDROBROMIDE 20 MG PO TABS
20.0000 mg | ORAL_TABLET | Freq: Every day | ORAL | Status: DC
  Administered 2014-06-24 – 2014-06-26 (×3): 20 mg via ORAL
  Filled 2014-06-23 (×3): qty 1

## 2014-06-23 MED ORDER — BUPIVACAINE-EPINEPHRINE (PF) 0.5% -1:200000 IJ SOLN
INTRAMUSCULAR | Status: AC
  Filled 2014-06-23: qty 30

## 2014-06-23 MED ORDER — TRANEXAMIC ACID 100 MG/ML IV SOLN
2000.0000 mg | INTRAVENOUS | Status: DC | PRN
  Administered 2014-06-23: 2000 mg via INTRAVENOUS

## 2014-06-23 MED ORDER — PROPOFOL INFUSION 10 MG/ML OPTIME
INTRAVENOUS | Status: DC | PRN
  Administered 2014-06-23 (×3): via INTRAVENOUS
  Administered 2014-06-23: 75 ug/kg/min via INTRAVENOUS

## 2014-06-23 MED ORDER — SODIUM CHLORIDE 0.9 % IJ SOLN
INTRAMUSCULAR | Status: DC | PRN
  Administered 2014-06-23: 40 mL

## 2014-06-23 MED ORDER — PANTOPRAZOLE SODIUM 40 MG PO TBEC
40.0000 mg | DELAYED_RELEASE_TABLET | Freq: Every day | ORAL | Status: DC
  Administered 2014-06-24 – 2014-06-26 (×3): 40 mg via ORAL
  Filled 2014-06-23 (×3): qty 1

## 2014-06-23 MED ORDER — TRANEXAMIC ACID 100 MG/ML IV SOLN
2000.0000 mg | Freq: Once | INTRAVENOUS | Status: DC
  Filled 2014-06-23 (×2): qty 20

## 2014-06-23 MED ORDER — LISINOPRIL-HYDROCHLOROTHIAZIDE 20-25 MG PO TABS: 1.0000 | ORAL_TABLET | Freq: Every day | ORAL | Status: DC

## 2014-06-23 MED ORDER — CLONIDINE HCL (ANALGESIA) 100 MCG/ML EP SOLN
EPIDURAL | Status: DC | PRN
  Administered 2014-06-23: 100 ug

## 2014-06-23 SURGICAL SUPPLY — 107 items
ADAPTER BOLT FEMORAL +2/-2 (Knees) ×2 IMPLANT
BANDAGE ELASTIC 4 VELCRO ST LF (GAUZE/BANDAGES/DRESSINGS) ×2 IMPLANT
BANDAGE ELASTIC 6 VELCRO ST LF (GAUZE/BANDAGES/DRESSINGS) ×6 IMPLANT
BANDAGE ESMARK 6X9 LF (GAUZE/BANDAGES/DRESSINGS) ×1 IMPLANT
BLADE OSCIL/SAGITTAL W/10 ST (BLADE) ×2 IMPLANT
BLADE SAG 18X100X1.27 (BLADE) ×2 IMPLANT
BLADE SAW SGTL 13.0X1.19X90.0M (BLADE) ×2 IMPLANT
BLADE SAW SGTL 81X20 HD (BLADE) ×2 IMPLANT
BLADE SURG 10 STRL SS (BLADE) ×8 IMPLANT
BNDG COHESIVE 6X5 TAN STRL LF (GAUZE/BANDAGES/DRESSINGS) ×2 IMPLANT
BNDG ELASTIC 6X15 VLCR STRL LF (GAUZE/BANDAGES/DRESSINGS) ×2 IMPLANT
BNDG ESMARK 6X9 LF (GAUZE/BANDAGES/DRESSINGS) ×2
BONE CANC CHIPS 20CC PCAN1/4 (Bone Implant) ×2 IMPLANT
BOWL SMART MIX CTS (DISPOSABLE) ×4 IMPLANT
CEMENT BONE SIMPLEX SPEEDSET (Cement) ×10 IMPLANT
CEMENT RESTRICTOR DEPUY SZ 3 (Cement) ×2 IMPLANT
CHIPS CANC BONE 20CC PCAN1/4 (Bone Implant) ×1 IMPLANT
COMP FEM CEM RT SZ3 (Orthopedic Implant) ×2 IMPLANT
COMPONENT FEM CEM RT SZ3 (Orthopedic Implant) ×1 IMPLANT
CONT SPEC 4OZ CLIKSEAL STRL BL (MISCELLANEOUS) ×4 IMPLANT
COVER BACK TABLE 24X17X13 BIG (DRAPES) IMPLANT
COVER SURGICAL LIGHT HANDLE (MISCELLANEOUS) ×2 IMPLANT
CUFF TOURNIQUET SINGLE 34IN LL (TOURNIQUET CUFF) ×2 IMPLANT
CUFF TOURNIQUET SINGLE 44IN (TOURNIQUET CUFF) IMPLANT
DISTAL WEDGE PFC 4MM RIGHT (Knees) ×4 IMPLANT
DRAPE IMP U-DRAPE 54X76 (DRAPES) ×2 IMPLANT
DRAPE INCISE IOBAN 66X45 STRL (DRAPES) IMPLANT
DRAPE ORTHO SPLIT 77X108 STRL (DRAPES) ×3
DRAPE PROXIMA HALF (DRAPES) ×2 IMPLANT
DRAPE SURG ORHT 6 SPLT 77X108 (DRAPES) ×3 IMPLANT
DRAPE U-SHAPE 47X51 STRL (DRAPES) ×2 IMPLANT
DRAPE X-RAY CASS 24X20 (DRAPES) IMPLANT
DRSG AQUACEL AG ADV 3.5X14 (GAUZE/BANDAGES/DRESSINGS) ×2 IMPLANT
DRSG PAD ABDOMINAL 8X10 ST (GAUZE/BANDAGES/DRESSINGS) ×4 IMPLANT
DURAPREP 26ML APPLICATOR (WOUND CARE) ×2 IMPLANT
ELECT REM PT RETURN 9FT ADLT (ELECTROSURGICAL) ×2
ELECTRODE REM PT RTRN 9FT ADLT (ELECTROSURGICAL) ×1 IMPLANT
EVACUATOR 1/8 PVC DRAIN (DRAIN) ×2 IMPLANT
FACESHIELD WRAPAROUND (MASK) ×2 IMPLANT
FEMORAL ADAPTER (Orthopedic Implant) ×2 IMPLANT
GAUZE SPONGE 4X4 12PLY STRL (GAUZE/BANDAGES/DRESSINGS) ×2 IMPLANT
GAUZE XEROFORM 5X9 LF (GAUZE/BANDAGES/DRESSINGS) ×2 IMPLANT
GLOVE BIO SURGEON ST LM GN SZ9 (GLOVE) ×2 IMPLANT
GLOVE BIOGEL PI IND STRL 7.0 (GLOVE) ×1 IMPLANT
GLOVE BIOGEL PI IND STRL 7.5 (GLOVE) IMPLANT
GLOVE BIOGEL PI IND STRL 8 (GLOVE) ×1 IMPLANT
GLOVE BIOGEL PI INDICATOR 7.0 (GLOVE) ×1
GLOVE BIOGEL PI INDICATOR 7.5 (GLOVE)
GLOVE BIOGEL PI INDICATOR 8 (GLOVE) ×1
GLOVE ECLIPSE 7.0 STRL STRAW (GLOVE) ×4 IMPLANT
GLOVE SURG ORTHO 8.0 STRL STRW (GLOVE) ×4 IMPLANT
GLOVE SURG SS PI 7.0 STRL IVOR (GLOVE) ×2 IMPLANT
GOWN STRL REUS W/ TWL LRG LVL3 (GOWN DISPOSABLE) ×3 IMPLANT
GOWN STRL REUS W/ TWL XL LVL3 (GOWN DISPOSABLE) ×1 IMPLANT
GOWN STRL REUS W/TWL LRG LVL3 (GOWN DISPOSABLE) ×3
GOWN STRL REUS W/TWL XL LVL3 (GOWN DISPOSABLE) ×1
HANDPIECE INTERPULSE COAX TIP (DISPOSABLE) ×1
HOOD PEEL AWAY FACE SHEILD DIS (HOOD) ×10 IMPLANT
IMMOBILIZER KNEE 20 (SOFTGOODS) IMPLANT
IMMOBILIZER KNEE 22 UNIV (SOFTGOODS) ×2 IMPLANT
IMMOBILIZER KNEE 24 THIGH 36 (MISCELLANEOUS) ×1 IMPLANT
IMMOBILIZER KNEE 24 UNIV (MISCELLANEOUS) ×2
INSERT TIBIAL TC3 15.0 (Knees) ×2 IMPLANT
KIT BASIN OR (CUSTOM PROCEDURE TRAY) ×2 IMPLANT
KIT ROOM TURNOVER OR (KITS) ×2 IMPLANT
MANIFOLD NEPTUNE II (INSTRUMENTS) ×2 IMPLANT
NEEDLE 18GX1X1/2 (RX/OR ONLY) (NEEDLE) ×2 IMPLANT
NEEDLE SPNL 18GX3.5 QUINCKE PK (NEEDLE) ×4 IMPLANT
NS IRRIG 1000ML POUR BTL (IV SOLUTION) ×2 IMPLANT
PACK TOTAL JOINT (CUSTOM PROCEDURE TRAY) ×2 IMPLANT
PACK UNIVERSAL I (CUSTOM PROCEDURE TRAY) ×2 IMPLANT
PAD ARMBOARD 7.5X6 YLW CONV (MISCELLANEOUS) ×4 IMPLANT
PAD CAST 4YDX4 CTTN HI CHSV (CAST SUPPLIES) ×1 IMPLANT
PADDING CAST COTTON 4X4 STRL (CAST SUPPLIES) ×1
PADDING CAST COTTON 6X4 STRL (CAST SUPPLIES) ×2 IMPLANT
POST AVE PFC 4MM (Knees) ×2 IMPLANT
POST AVE PFC 8MM (Knees) ×2 IMPLANT
RUBBERBAND STERILE (MISCELLANEOUS) ×2 IMPLANT
SET HNDPC FAN SPRY TIP SCT (DISPOSABLE) ×1 IMPLANT
SLEEVE UNIV FEM DIST PRO SZ 31 (Sleeve) ×2 IMPLANT
SPONGE LAP 18X18 X RAY DECT (DISPOSABLE) ×2 IMPLANT
STAPLER VISISTAT 35W (STAPLE) ×2 IMPLANT
STEM FLUTED UNIV REV 75X20 (Stem) ×2 IMPLANT
STEM TIBIA PFC 13X60MM (Stem) ×2 IMPLANT
STRIP CLOSURE SKIN 1/2X4 (GAUZE/BANDAGES/DRESSINGS) ×2 IMPLANT
SUCTION FRAZIER TIP 10 FR DISP (SUCTIONS) ×2 IMPLANT
SUT ETHILON 3 0 PS 1 (SUTURE) ×12 IMPLANT
SUT PROLENE 3 0 PS 2 (SUTURE) ×2 IMPLANT
SUT VIC AB 0 CTB1 27 (SUTURE) ×6 IMPLANT
SUT VIC AB 1 CT1 27 (SUTURE) ×7
SUT VIC AB 1 CT1 27XBRD ANBCTR (SUTURE) ×7 IMPLANT
SUT VIC AB 2-0 CT1 27 (SUTURE) ×4
SUT VIC AB 2-0 CT1 TAPERPNT 27 (SUTURE) ×4 IMPLANT
SWAB CULTURE LIQUID MINI MALE (MISCELLANEOUS) ×2 IMPLANT
SYR 30ML LL (SYRINGE) ×4 IMPLANT
SYR 30ML SLIP (SYRINGE) ×2 IMPLANT
SYR TB 1ML LUER SLIP (SYRINGE) ×2 IMPLANT
TOWEL OR 17X24 6PK STRL BLUE (TOWEL DISPOSABLE) ×2 IMPLANT
TOWEL OR 17X26 10 PK STRL BLUE (TOWEL DISPOSABLE) ×4 IMPLANT
TRAY FOLEY CATH 16FRSI W/METER (SET/KITS/TRAYS/PACK) ×2 IMPLANT
TRAY REVISION SZ 4 (Knees) ×2 IMPLANT
TRAY SLEEVE CEM ML (Knees) ×2 IMPLANT
TUBE ANAEROBIC SPECIMEN COL (MISCELLANEOUS) ×4 IMPLANT
WATER STERILE IRR 1000ML POUR (IV SOLUTION) ×6 IMPLANT
WEDGE DISTAL PFC RIGHT 4MM (Knees) ×2 IMPLANT
WEDGE SZ 4 15MM (Knees) ×4 IMPLANT
YANKAUER SUCT BULB TIP NO VENT (SUCTIONS) ×2 IMPLANT

## 2014-06-23 NOTE — Interval H&P Note (Signed)
History and Physical Interval Note:  06/23/2014 1:40 PM  [name]  has presented today for surgery, with the diagnosis of RIGHT LOOSE TOTAL KNEE ARTHROPLASTY  The various methods of treatment have been discussed with the patient and family. After consideration of risks, benefits and other options for treatment, the patient has consented to  Procedure(s): TOTAL KNEE REVISION (Right) as a surgical intervention .  The patient's history has been reviewed, patient examined, no change in status, stable for surgery.  I have reviewed the patient's chart and labs.  Questions were answered to the patient's satisfaction.     Kresta Templeman SCOTT

## 2014-06-23 NOTE — Anesthesia Preprocedure Evaluation (Addendum)
Anesthesia Evaluation  Patient identified by MRN, date of birth, ID band Patient awake    Reviewed: Allergy & Precautions, H&P , NPO status , Patient's Chart, lab work & pertinent test results, reviewed documented beta blocker date and time   Airway Mallampati: II  TM Distance: >3 FB Neck ROM: Full    Dental no notable dental hx. (+) Teeth Intact, Dental Advisory Given   Pulmonary sleep apnea and Continuous Positive Airway Pressure Ventilation , former smoker,  breath sounds clear to auscultation  Pulmonary exam normal       Cardiovascular hypertension, Pt. on medications and Pt. on home beta blockers Rhythm:Regular Rate:Normal     Neuro/Psych Anxiety Depression negative neurological ROS     GI/Hepatic Neg liver ROS, GERD-  Medicated and Controlled,  Endo/Other  diabetes, Type 2, Oral Hypoglycemic AgentsHypothyroidism   Renal/GU negative Renal ROS  negative genitourinary   Musculoskeletal   Abdominal   Peds  Hematology negative hematology ROS (+)   Anesthesia Other Findings   Reproductive/Obstetrics negative OB ROS                           Anesthesia Physical Anesthesia Plan  ASA: III  Anesthesia Plan: Spinal   Post-op Pain Management:    Induction: Intravenous  Airway Management Planned: Simple Face Mask  Additional Equipment:   Intra-op Plan:   Post-operative Plan:   Informed Consent: I have reviewed the patients History and Physical, chart, labs and discussed the procedure including the risks, benefits and alternatives for the proposed anesthesia with the patient or authorized representative who has indicated his/her understanding and acceptance.   Dental advisory given  Plan Discussed with: CRNA and Surgeon  Anesthesia Plan Comments: (Pt has his cpap machine with him and will wear tonight.)       Anesthesia Quick Evaluation

## 2014-06-23 NOTE — Brief Op Note (Signed)
06/23/2014  6:50 PM  PATIENT:  Sunday Corn  49 y.o. male  PRE-OPERATIVE DIAGNOSIS:  RIGHT LOOSE TOTAL KNEE ARTHROPLASTY  POST-OPERATIVE DIAGNOSIS:  Right loose total Knee arthroplasty  PROCEDURE:  Procedure(s): TOTAL KNEE REVISION  SURGEON:  Surgeon(s): Meredith Pel, MD  ASSISTANT: carla bethune rnfa  ANESTHESIA:   spinal  EBL: 400 ml    Total I/O In: 2500 [I.V.:2500] Out: 725 [Urine:325; Blood:400]  BLOOD ADMINISTERED: none  DRAINS: none   LOCAL MEDICATIONS USED: exparel marcaine morphine clonidine SPECIMEN:  Fluid and tissue for culture  COUNTS:  YES  TOURNIQUET:   Total Tourniquet Time Documented: Thigh (Right) - 121 minutes Thigh (Right) - 41 minutes Total: Thigh (Right) - 162 minutes   DICTATION: .Other Dictation: Dictation Number [id number]  PLAN OF CARE: Admit to inpatient   PATIENT DISPOSITION:  PACU - hemodynamically stable

## 2014-06-23 NOTE — Transfer of Care (Signed)
Immediate Anesthesia Transfer of Care Note  Patient: [name]  Procedure(s) Performed: Procedure(s): TOTAL KNEE REVISION (Right)  Patient Location: PACU  Anesthesia Type:MAC and Spinal  Level of Consciousness: awake, alert  and oriented  Airway & Oxygen Therapy: Patient Spontanous Breathing and Patient connected to nasal cannula oxygen  Post-op Assessment: Report given to RN and Post -op Vital signs reviewed and stable  Post vital signs: Reviewed and stable  Last Vitals:  Filed Vitals:   06/23/14 0954  BP: 158/99  Pulse: 71  Temp: 36.8 C  Resp: 20    Complications: No apparent anesthesia complications

## 2014-06-23 NOTE — Anesthesia Postprocedure Evaluation (Signed)
  Anesthesia Post-op Note  Patient: [name]  Procedure(s) Performed: Procedure(s): TOTAL KNEE REVISION (Right)  Patient Location: PACU  Anesthesia Type:Spinal  Level of Consciousness: awake and alert   Airway and Oxygen Therapy: Patient Spontanous Breathing  Post-op Pain: mild  Post-op Assessment: Post-op Vital signs reviewed  Post-op Vital Signs: stable  Last Vitals:  Filed Vitals:   06/23/14 2032  BP: 124/79  Pulse: 80  Temp: 36.8 C  Resp: 18    Complications: No apparent anesthesia complications

## 2014-06-23 NOTE — Progress Notes (Signed)
ANTICOAGULATION CONSULT NOTE - Initial Consult  Pharmacy Consult for warfarin Indication: VTE prophylaxis  No Known Allergies  Patient Measurements:    Vital Signs: Temp: 98.2 F (36.8 C) (03/01 2032) Temp Source: Oral (03/01 2032) BP: 124/79 mmHg (03/01 2032) Pulse Rate: 80 (03/01 2032)  Labs: No results for input(s): HGB, HCT, PLT, APTT, LABPROT, INR, HEPARINUNFRC, CREATININE, CKTOTAL, CKMB, TROPONINI in the last 72 hours.  Estimated Creatinine Clearance: 93 mL/min (by C-G formula based on Cr of 1.19).   Medical History: Past Medical History  Diagnosis Date  . Hypertension     essential, benign  . Anxiety and depression   . Chest pain     hx  . HLD (hyperlipidemia)   . Palpitation     hx  . Other chest pain     tightness, pressure  . Precordial pain   . Murmur   . Bruit     L  . Impotence of organic origin   . Edema   . Goiter   . Anxiety   . Sleep apnea     uses cpap  . Diabetes mellitus without complication     "borderline"  . Hypothyroidism   . GERD (gastroesophageal reflux disease)     uses Omeprazole  . Fatty liver     Medications:  Scheduled:  . buPROPion  150 mg Oral BID  .  ceFAZolin (ANCEF) IV  1 g Intravenous 3 times per day  . citalopram  20 mg Oral Daily  . [START ON 06/24/2014] clidinium-chlordiazePOXIDE  1 capsule Oral TID AC  . gabapentin  100 mg Oral BID  . [START ON 06/24/2014] levothyroxine  88 mcg Oral QAC breakfast  . lisinopril-hydrochlorothiazide  1 tablet Oral Daily  . methocarbamol      . metoprolol tartrate  25 mg Oral BID  . multivitamin  1 tablet Oral Daily  . pantoprazole  40 mg Oral Daily  . tranexamic acid (CYKLOKAPRON) topical -INTRAOP  2,000 mg Topical Once    Assessment: 49 year old male status post TKA to start warfarin for VTE prophylaxis.  Patient was not on anticoagulation prior to admission.  CBC on 2/18 was within normal limits. No further labs yet this admission.   Goal of Therapy:  INR 2-3 Monitor  platelets by anticoagulation protocol: Yes   Plan:  Warfarin 10mg  po x1 tonight per oral anticoagulation protocol.  Daily PT/INR.  Warfarin book and video to initiate education.   Sloan Leiter, PharmD, BCPS Clinical Pharmacist [phone] 06/23/2014,8:44 PM

## 2014-06-23 NOTE — Anesthesia Procedure Notes (Signed)
Spinal Patient location during procedure: OR Start time: 06/23/2014 2:03 PM End time: 06/23/2014 2:10 PM Staffing Anesthesiologist: Roderic Palau E Performed by: anesthesiologist  Preanesthetic Checklist Completed: patient identified, surgical consent, pre-op evaluation, timeout performed, IV checked, risks and benefits discussed and monitors and equipment checked Spinal Block Patient position: sitting Prep: Betadine Patient monitoring: cardiac monitor, continuous pulse ox and blood pressure Approach: midline Location: L3-4 Injection technique: single-shot Needle Needle type: Pencan  Needle gauge: 24 G Needle length: 9 cm Assessment Sensory level: T6 Additional Notes Functioning IV was confirmed and monitors were applied. Sterile prep and drape, including hand hygiene and sterile gloves were used. The patient was positioned and the spine was prepped. The skin was anesthetized with lidocaine.  Free flow of clear CSF was obtained prior to injecting local anesthetic into the CSF.  The spinal needle aspirated freely following injection.  The needle was carefully withdrawn.  The patient tolerated the procedure well.

## 2014-06-24 ENCOUNTER — Encounter (HOSPITAL_COMMUNITY): Payer: Self-pay | Admitting: General Practice

## 2014-06-24 LAB — PROTIME-INR
INR: 1.06 (ref 0.00–1.49)
PROTHROMBIN TIME: 13.9 s (ref 11.6–15.2)

## 2014-06-24 LAB — BASIC METABOLIC PANEL
Anion gap: 8 (ref 5–15)
BUN: 22 mg/dL (ref 6–23)
CALCIUM: 7.9 mg/dL — AB (ref 8.4–10.5)
CHLORIDE: 101 mmol/L (ref 96–112)
CO2: 26 mmol/L (ref 19–32)
Creatinine, Ser: 1.64 mg/dL — ABNORMAL HIGH (ref 0.50–1.35)
GFR calc Af Amer: 55 mL/min — ABNORMAL LOW (ref >90)
GFR, EST NON AFRICAN AMERICAN: 48 mL/min — AB (ref >90)
Glucose, Bld: 113 mg/dL — ABNORMAL HIGH (ref 70–99)
Potassium: 5.3 mmol/L — ABNORMAL HIGH (ref 3.5–5.1)
Sodium: 135 mmol/L (ref 135–145)

## 2014-06-24 LAB — CBC
HCT: 32.4 % — ABNORMAL LOW (ref 39.0–52.0)
Hemoglobin: 10.7 g/dL — ABNORMAL LOW (ref 13.0–17.0)
MCH: 31.4 pg (ref 26.0–34.0)
MCHC: 33 g/dL (ref 30.0–36.0)
MCV: 95 fL (ref 78.0–100.0)
PLATELETS: 287 10*3/uL (ref 150–400)
RBC: 3.41 MIL/uL — ABNORMAL LOW (ref 4.22–5.81)
RDW: 13.3 % (ref 11.5–15.5)
WBC: 9.2 10*3/uL (ref 4.0–10.5)

## 2014-06-24 MED ORDER — WARFARIN SODIUM 5 MG PO TABS
10.0000 mg | ORAL_TABLET | Freq: Once | ORAL | Status: AC
  Administered 2014-06-24: 10 mg via ORAL
  Filled 2014-06-24: qty 2

## 2014-06-24 MED ORDER — ZOLPIDEM TARTRATE 5 MG PO TABS
5.0000 mg | ORAL_TABLET | Freq: Every evening | ORAL | Status: DC | PRN
  Administered 2014-06-24 – 2014-06-25 (×2): 5 mg via ORAL
  Filled 2014-06-24 (×2): qty 1

## 2014-06-24 NOTE — Progress Notes (Signed)
Orthopedic Tech Progress Note Patient Details:  [name] 1965/12/02 [id number]  CPM Right Knee CPM Right Knee: On Right Knee Flexion (Degrees): 45 Right Knee Extension (Degrees): 0   [name] 06/24/2014, 11:05 AM

## 2014-06-24 NOTE — Op Note (Signed)
NAME:  [name], [name] NO.:  [id number]  MEDICAL RECORD NO.:  [id number]  LOCATION:  5N07C                        FACILITY:  [facility]  PHYSICIAN:  Anderson Malta, M.D.    DATE OF BIRTH:  Jun 07, 1965  DATE OF PROCEDURE: DATE OF DISCHARGE:                              OPERATIVE REPORT   PREOPERATIVE DIAGNOSIS:  Right loose total knee.  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  SURGEON:  Anderson Malta, M.D.  ASSISTANT:  Laure Kidney, RNFA.  TOURNIQUET TIME:  Two hours at 300 mmHg.  Tourniquet was released for about 45 minutes and then placed again for 40 minutes for cementing.  COMPONENTS UTILIZED:  DePuy tibial tray rotating platform revision MBT size 4 cemented with 15 mm step wedge, cemented metaphyseal sleeve, 60 mm cemented stem femur size 3, PC 3, 4 mm augments to distal aspect of joint lines, 8 mm posterolateral augments, 4 mm posteromedial augment, press-fit stem with press-fit femoral sleeve, stem was 20 mm in diameter, rotating platform tibial insert, 15 mm patella was not revised.  INDICATIONS:  [name] is a patient with incapacitating right knee pain, presents for operative management after explanation of risks and benefits.  OPERATIVE FINDINGS:  The patient had good range of motion, good stability to varus-valgus stress at 0 and 90 degrees.  Upon entering the knee, there was clear fluid, which was sent for culture and sensitivity. 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  No evidence of acute inflammation on pathologic analysis.  PROCEDURE IN DETAIL:  The patient was brought to operating room where spinal anesthetic was induced.  Preop IV antibiotics were given.  The time-out was called.  Right leg was prescrubbed with alcohol  and Betadine, allowed to air dry, prepped with DuraPrep solution and draped in sterile manner.  [name] was used to cover the operative field.  Leg was elevated and tagged with the Esmarch wrap.  Tourniquet was inflated. Anterior approach to knee was made.  Skin and subcutaneous tissue were sharply divided.  Upon entering the knee, there was a significant serous fluid, which was sent for culture.  Significant amount of rice bodies and particle reaction was present throughout all compartments of the knee.  The large portion of this was sent to pathology, which determined there was no acute inflammation.  This will also be sent for culture. At this time, patella could not be everted.  The interface of the patella was inspected, was essentially overgrown with soft tissue and thus, no evidence of significant particle disease was present on the patella.  However significant particle disease was present around the femoral and tibial components as well as underneath.  Using oscillating saw, ACL blade, the femoral and tibial components were extracted.  The tibial component was loose.  Femoral component was also loose, but not as loose as the tibial component.  Again undermining the cement, bone interface was a significant amount of cystic changes in the bone consistent with particle disease.  These areas were cleared out and  curetted.  They were also bone grafted later in the case.  Following extraction of the femoral and tibial components, the tibial revision was started.  The patient was then varus to begin with tibial component, a cleanup cut actually took about 1-2 mm of bone around the rim of the medial tibia, but about a cm on the lateral side.  Went down to the top of the fibular head.  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  There was one defect posteriorly, which measured about 1.5 cm, which was not incapsulated bicortical rim.  There was good cortical rim  support around the rest of the tibia.  Following the initial tibial resection, tibia was prepared for cemented stem and a cemented keel.  This was performed with the collaterals and posterior structures well protected.  The femur was then also prepared by sizing initially size 4, but then size 3.  Size 3 was chosen because of the necessity for posterior condylar augments.  The guide was placed into the femur and the distal femoral cut was made in 5 degrees of valgus. Chamfer cuts and posterior cuts were then made with 8 mm augment required laterally-posteriorly, 4 mm augment required medially- posteriorly.  At this time, the femoral canal was prepared and reamed up to a size 22 except a press-fit 20 stem.  It was also prepared for a press-fit metaphyseal sleeve.  Correct rotation of the component was noted.  Trial reduction was performed initially with the femoral trial and tibial trial, which was given a very large insert.  It was decided at that time to distalize the femur 4 mm distal augments and to move the joint line from the tibia proximally about 15 mm.  With those trials in position, the patient had excellent stability.  Full extension, full flexion in great stability varus-valgus stress with a 15 mm spacer in position.  Trial components removed.  Tourniquet initially released. Thorough irrigation was performed.  Synovectomy performed on all structures.  Exparel injected into the soft tissue.  Thorough irrigation again performed.  During preparation for cementing, the cement restrictor was placed.  During preparation, more irrigation was performed.  Leg was then elevated and exsanguinated.  The Esmarch wrap was placed again for 40 minutes while cementation occurred.  Again, the patella had minimal to no wear and was stable, and did not have evidence of particle disease.  At this time, the tibia was cemented and the femur was cemented.  In the same stability, parameters were  maintained with the 15 mm PC 3 rotating platform spacer.  Patella tracked excellent using no thumbs technique.  The tourniquet again released after cement had hardened and excess cement was removed.  Thorough irrigation was again performed and tranexamic acid topically was placed.  At this time, the knee was closed using #1 Vicryl, 0 Vicryl, 2-0 Vicryl, and running 3- 0 Prolene.  Solution of Marcaine, morphine, and clonidine were injected into the knee.  Bulky wrap and knee immobilizer were placed.  The patient tolerated the procedure well without immediate complication. Transferred to recovery room in stable condition.     Anderson Malta, M.D.     GSD/MEDQ  D:  06/23/2014  T:  06/24/2014  Job:  [id number]

## 2014-06-24 NOTE — Care Management Note (Signed)
CARE MANAGEMENT NOTE 06/24/2014  Patient:  YOUNG, MULVEY   Account Number:  [id number]  Date Initiated:  06/24/2014  Documentation initiated by:  Ricki Miller  Subjective/Objective Assessment:   45 yr. old male admitted with right loose total knee arthroplasty. patient had a right total knee arthroplasty.     Action/Plan:   CM spoke with patient concerning home health and DME needs. patient preoperatively setup with [facility], no changes. Has wife and daughter to assist at discharge.   Anticipated DC Date:  06/25/2014   Anticipated DC Plan:  [place]  CM consult      PAC Choice  [place]   Choice offered to / List presented to:  C-1 Patient   DME arranged  3-N-1  [place]  CPM      DME agency  [employer]     [place] arranged  HH-1 RN  [facility]      [place] agency  [facility]   Status of service:  Completed, signed off Medicare Important Message given?   (If response is "NO", the following Medicare IM given date fields will be blank) Date Medicare IM given:   Medicare IM given by:   Date Additional Medicare IM given:   Additional Medicare IM given by:    Discharge Disposition:  [place]  Per UR Regulation:  Reviewed for med. necessity/level of care/duration of stay

## 2014-06-24 NOTE — Progress Notes (Signed)
Physical Therapy Treatment Note  Clinical Impression: Pt with increased pain this afternoon compared to AM. Pt with minimal tolerance of CPM, on 50 min at 45 deg. Pt did tolerate amb well. From strength stand point can graduate from [facility] however awaiting MD to change orders. Pt c/o stiffness in R knee from being in [place]. Acute PT to con't to follow to progress mobility as able.    06/24/14 1600  PT Visit Information  Last PT Received On 06/24/14  Assistance Needed +1  History of Present Illness 49 yo old male s/p RIGHT LOOSE TOTAL KNEE ARTHROPLASTY  PT Time Calculation  PT Start Time (ACUTE ONLY) 1545  PT Stop Time (ACUTE ONLY) 1611  PT Time Calculation (min) (ACUTE ONLY) 26 min  Subjective Data  Subjective "I hurt so bad, I couldn't even tolerate CPM."  Patient Stated Goal stop hurting  Precautions  Precautions Knee  Precaution Comments Reviewed knee precautions and importance of no pillow under knee  Required Braces or Orthoses Knee Immobilizer - Right  Knee Immobilizer - Right On except when in CPM  Restrictions  Weight Bearing Restrictions Yes  RLE Weight Bearing WBAT  Pain Assessment  Pain Assessment 0-10  Pain Score 6  Pain Location R knee  Pain Descriptors / Indicators Aching (stiff)  Pain Intervention(s) Monitored during session  Cognition  Arousal/Alertness Awake/alert  Behavior During Therapy WFL for tasks assessed/performed  Overall Cognitive Status Within Functional Limits for tasks assessed  Bed Mobility  Overal bed mobility Needs Assistance  Bed Mobility Supine to Sit  Rolling Supervision  Sidelying to sit Supervision  General bed mobility comments increaed time, labored effort but able to manage LE indep  Transfers  Overall transfer level Needs assistance  Equipment used Rolling walker (2 wheeled)  Transfers Sit to/from Stand  Sit to Stand Supervision  General transfer comment Cues for hand placement and overall safety  Ambulation/Gait  Ambulation/Gait  assistance Min guard  Ambulation Distance (Feet) 200 Feet  Assistive device Rolling walker (2 wheeled)  Gait Pattern/deviations Step-to pattern;Step-through pattern;Decreased step length - right;Decreased stance time - right;Decreased weight shift to right  Gait velocity slow  General Gait Details pt able to transition to step through gait pattern with v/c's,  v/c's to relax shoulders and increase R LE WBing   Exercises  Exercises Other exercises  Other Exercises  Other Exercises worked on increasing R Knee ROM with CPM due to pt's significant pain with active and AA R knee ROM. pt unable to tolerate past 56 deg on CPM. pt reported 10/10 pain  PT - End of Session  Equipment Utilized During Treatment Gait belt;Right knee immobilizer  Activity Tolerance Patient limited by pain  Patient left in bed;with call bell/phone within reach;in CPM;with family/visitor present  Nurse Communication Mobility status  PT - Assessment/Plan  PT Plan Current plan remains appropriate  PT Frequency (ACUTE ONLY) 7X/week  Follow Up Recommendations Home health PT;Supervision/Assistance - 24 hour  PT equipment None recommended by PT  PT Goal Progression  Progress towards PT goals Progressing toward goals  PT General Charges  $$ ACUTE PT VISIT 1 Procedure  PT Treatments  $Gait Training 8-22 mins  $Therapeutic Exercise 8-22 mins   Kittie Plater, PT, DPT Pager #: [phone] Office #: [phone]

## 2014-06-24 NOTE — Progress Notes (Signed)
Utilization review completed.  

## 2014-06-24 NOTE — Discharge Instructions (Signed)
Information on my medicine - Coumadin   (Warfarin)  This medication education was reviewed with me or my healthcare representative as part of my discharge preparation.  The pharmacist that spoke with me during my hospital stay was:  Sindy Gu[place], [facility]  Why was Coumadin prescribed for you? Coumadin was prescribed for you because you have a blood clot or a medical condition that can cause an increased risk of forming blood clots. Blood clots can cause serious health problems by blocking the flow of blood to the heart, lung, or brain. Coumadin can prevent harmful blood clots from forming. As a reminder your indication for Coumadin is:   Blood Clot Prevention After Orthopedic Surgery  What test will check on my response to Coumadin? While on Coumadin (warfarin) you will need to have an INR test regularly to ensure that your dose is keeping you in the desired range. The INR (international normalized ratio) number is calculated from the result of the laboratory test called prothrombin time (PT).  If an INR APPOINTMENT HAS NOT ALREADY BEEN MADE FOR YOU please schedule an appointment to have this lab work done by your health care provider within 7 days. Your INR goal is usually a number between:  2 to 3 or your provider may give you a more narrow range like 2-2.5.  Ask your health care provider during an office visit what your goal INR is.  What  do you need to  know  About  COUMADIN? Take Coumadin (warfarin) exactly as prescribed by your healthcare provider about the same time each day.  DO NOT stop taking without talking to the doctor who prescribed the medication.  Stopping without other blood clot prevention medication to take the place of Coumadin may increase your risk of developing a new clot or stroke.  Get refills before you run out.  What do you do if you miss a dose? If you miss a dose, take it as soon as you remember on the same day then continue your regularly scheduled regimen the next  day.  Do not take two doses of Coumadin at the same time.  Important Safety Information A possible side effect of Coumadin (Warfarin) is an increased risk of bleeding. You should call your healthcare provider right away if you experience any of the following: ? Bleeding from an injury or your nose that does not stop. ? Unusual colored urine (red or dark brown) or unusual colored stools (red or black). ? Unusual bruising for unknown reasons. ? A serious fall or if you hit your head (even if there is no bleeding).  Some foods or medicines interact with Coumadin (warfarin) and might alter your response to warfarin. To help avoid this: ? Eat a balanced diet, maintaining a consistent amount of Vitamin K. ? Notify your provider about major diet changes you plan to make. ? Avoid alcohol or limit your intake to 1 drink for women and 2 drinks for men per day. (1 drink is 5 oz. wine, 12 oz. beer, or 1.5 oz. liquor.)  Make sure that ANY health care provider who prescribes medication for you knows that you are taking Coumadin (warfarin).  Also make sure the healthcare provider who is monitoring your Coumadin knows when you have started a new medication including herbals and non-prescription products.  Coumadin (Warfarin)  Major Drug Interactions  Increased Warfarin Effect Decreased Warfarin Effect  Alcohol (large quantities) Antibiotics (esp. Septra/Bactrim, Flagyl, Cipro) Amiodarone (Cordarone) Aspirin (ASA) Cimetidine (Tagamet) Megestrol (Megace) NSAIDs (ibuprofen,  naproxen, etc.) °Piroxicam (Feldene) °Propafenone (Rythmol SR) °Propranolol (Inderal) °Isoniazid (INH) °Posaconazole (Noxafil) Barbiturates (Phenobarbital) °Carbamazepine (Tegretol) °Chlordiazepoxide (Librium) °Cholestyramine (Questran) °Griseofulvin °Oral Contraceptives °Rifampin °Sucralfate (Carafate) °Vitamin K  ° °Coumadin® (Warfarin) Major Herbal Interactions  °Increased Warfarin Effect Decreased Warfarin Effect   °Garlic °Ginseng °Ginkgo biloba Coenzyme Q10 °Green tea °St. John’s wort   ° °Coumadin® (Warfarin) FOOD Interactions  °Eat a consistent number of servings per week of foods HIGH in Vitamin K °(1 serving = ½ cup)  °Collards (cooked, or boiled & drained) °Kale (cooked, or boiled & drained) °Mustard greens (cooked, or boiled & drained) °Parsley *serving size only = ¼ cup °Spinach (cooked, or boiled & drained) °Swiss chard (cooked, or boiled & drained) °Turnip greens (cooked, or boiled & drained)  °Eat a consistent number of servings per week of foods MEDIUM-HIGH in Vitamin K °(1 serving = 1 cup)  °Asparagus (cooked, or boiled & drained) °Broccoli (cooked, boiled & drained, or raw & chopped) °Brussel sprouts (cooked, or boiled & drained) *serving size only = ½ cup °Lettuce, raw (green leaf, endive, romaine) °Spinach, raw °Turnip greens, raw & chopped  ° °These websites have more information on Coumadin (warfarin):  www.coumadin.com; °www.ahrq.gov/consumer/coumadin.htm; ° ° °

## 2014-06-24 NOTE — Progress Notes (Signed)
Subjective: Pt stable - pain controlled   Objective: Vital signs in last 24 hours: Temp:  [97.9 F (36.6 C)-98.8 F (37.1 C)] 98.8 F (37.1 C) (03/02 0248) Pulse Rate:  [71-99] 91 (03/02 0248) Resp:  [8-20] 16 (03/02 0400) BP: (92-158)/(56-99) 95/56 mmHg (03/02 0248) SpO2:  [94 %-100 %] 96 % (03/02 0400)  Intake/Output from previous day: 03/01 0701 - 03/02 0700 In: 3220 [P.O.:720; I.V.:2500] Out: 850 [Urine:450; Blood:400] Intake/Output this shift: Total I/O In: 720 [P.O.:720] Out: 125 [Urine:125]  Exam:  Neurovascular intact Sensation intact distally Intact pulses distally  Labs: No results for input(s): HGB in the last 72 hours. No results for input(s): WBC, RBC, HCT, PLT in the last 72 hours. No results for input(s): NA, K, CL, CO2, BUN, CREATININE, GLUCOSE, CALCIUM in the last 72 hours. No results for input(s): LABPT, INR in the last 72 hours.  Assessment/Plan: PT and CPM today   DEAN,GREGORY SCOTT 06/24/2014, 6:56 AM

## 2014-06-24 NOTE — Evaluation (Signed)
Physical Therapy Evaluation Patient Details Name: [name] MRN: [id number] DOB: 1965/10/13 Today's Date: 06/24/2014   History of Present Illness  49 yo old male s/p RIGHT LOOSE TOTAL KNEE ARTHROPLASTY  Clinical Impression  Pt is s/p TKA resulting in the deficits listed below (see PT Problem List). Pt tolerating OOB mobility well. Pt will benefit from skilled PT to increase their independence and safety with mobility to allow discharge to the venue listed below.      Follow Up Recommendations Home health PT;Supervision/Assistance - 24 hour    Equipment Recommendations  None recommended by PT    Recommendations for Other Services       Precautions / Restrictions Precautions Precautions: Knee Precaution Comments: Reviewed knee precautions and importance of no pillow under knee Required Braces or Orthoses: Knee Immobilizer - Right Knee Immobilizer - Right: On except when in CPM Restrictions Weight Bearing Restrictions: Yes RLE Weight Bearing: Weight bearing as tolerated      Mobility  Bed Mobility Overal bed mobility: Needs Assistance Bed Mobility: Rolling;Sidelying to Sit Rolling: Supervision Sidelying to sit: Supervision       General bed mobility comments: pt up in chair upon PT arrival  Transfers Overall transfer level: Needs assistance Equipment used: Rolling walker (2 wheeled) Transfers: Sit to/from Stand Sit to Stand: Supervision         General transfer comment: Cues for hand placement and overall safety  Ambulation/Gait Ambulation/Gait assistance: Min guard Ambulation Distance (Feet): 200 Feet Assistive device: Rolling walker (2 wheeled) Gait Pattern/deviations: Step-through pattern;Antalgic Gait velocity: slow/guarded   General Gait Details: pt able to transition to step through gait pattern with v/c's,  v/c's to relax shoulders and increase R LE WBing   Stairs            Wheelchair Mobility    Modified Rankin (Stroke Patients  Only)       Balance Overall balance assessment: Needs assistance Sitting-balance support: No upper extremity supported;Feet supported Sitting balance-Leahy Scale: Good     Standing balance support: Bilateral upper extremity supported Standing balance-Leahy Scale: Fair                               Pertinent Vitals/Pain Pain Assessment: 0-10 Pain Score: 6  Pain Location: R knee Pain Descriptors / Indicators: Aching;Throbbing Pain Intervention(s): Monitored during session    Home Living Family/patient expects to be discharged to:: Private residence Living Arrangements: Spouse/significant other;Children Available Help at Discharge: Family;Available 24 hours/day (wife is diabled, on a heart transplant list) Type of Home: House Home Access: Stairs to enter   [employer] of Steps: 3 Home Layout: Two level;Bed/bath upstairs Home Equipment: Bedside commode;Walker - 2 wheels Additional Comments: Pt states he plans to sleep on the couch and take sponge baths post discharge until he is able to tolerate stairs    Prior Function Level of Independence: Independent               Hand Dominance   Dominant Hand: Right    Extremity/Trunk Assessment   Upper Extremity Assessment: Overall WFL for tasks assessed           Lower Extremity Assessment: RLE deficits/detail RLE Deficits / Details: able to complete quad set and SLR, initiate active knee flexion    Cervical / Trunk Assessment: Normal  Communication   Communication: No difficulties  Cognition Arousal/Alertness: Awake/alert Behavior During Therapy: WFL for tasks assessed/performed Overall Cognitive Status: Within Functional Limits  for tasks assessed                      General Comments      Exercises Total Joint Exercises Ankle Circles/Pumps: AROM;Both;10 reps;Supine Quad Sets: AROM;Right;10 reps;Supine Heel Slides: Right;AAROM;10 reps;Supine Goniometric ROM: 0-58 active R  knee flexion      Assessment/Plan    PT Assessment Patient needs continued PT services  PT Diagnosis Difficulty walking;Acute pain   PT Problem List Decreased strength;Decreased range of motion;Decreased activity tolerance;Decreased balance;Decreased mobility  PT Treatment Interventions DME instruction;Gait training;Stair training;Functional mobility training;Therapeutic activities;Therapeutic exercise   PT Goals (Current goals can be found in the Care Plan section) Acute Rehab PT Goals Patient Stated Goal: go home PT Goal Formulation: With patient Time For Goal Achievement: 07/01/14 Potential to Achieve Goals: Good    Frequency 7X/week   Barriers to discharge        Co-evaluation               End of Session Equipment Utilized During Treatment: Gait belt;Right knee immobilizer Activity Tolerance: Patient tolerated treatment well Patient left: in bed;with call bell/phone within reach Nurse Communication: Mobility status         Time: 2585-2778 PT Time Calculation (min) (ACUTE ONLY): 32 min   Charges:   PT Evaluation $Initial PT Evaluation Tier I: 1 Procedure PT Treatments $Gait Training: 8-22 mins   PT G CodesKingsley Callander 06/24/2014, 12:20 PM   Kittie Plater, PT, DPT Pager #: [phone] Office #: [phone]

## 2014-06-24 NOTE — Evaluation (Signed)
Occupational Therapy Evaluation Patient Details Name: [name] MRN: [id number] DOB: 1965/06/12 Today's Date: 06/24/2014    History of Present Illness 49 yo old male s/p RIGHT LOOSE TOTAL KNEE ARTHROPLASTY   Clinical Impression   Patient independent PTA. Patient currently requires up to mod assist for LB ADLs and supervision for functional mobility/transfers. Patient will benefit from acute OT to increase overall independence in the areas of ADLs, functional mobility, and overall safety in order to safely discharge home with wife.     Follow Up Recommendations  No OT follow up;Supervision - Intermittent    Equipment Recommendations  Other (comment) (AE - reacher, sock aid, LH sponge, LH shoe horn)    Recommendations for Other Services  None at this time     Precautions / Restrictions Precautions Precautions: Knee Precaution Comments: Reviewed knee precautions and importance of no pillow under knee Required Braces or Orthoses: Knee Immobilizer - Right Knee Immobilizer - Right: On except when in CPM Restrictions Weight Bearing Restrictions: Yes RLE Weight Bearing: Weight bearing as tolerated      Mobility Bed Mobility Overal bed mobility: Needs Assistance Bed Mobility: Rolling;Sidelying to Sit Rolling: Supervision Sidelying to sit: Supervision General bed mobility comments: Using bed rails and HOB elevated  Transfers Overall transfer level: Needs assistance Equipment used: Rolling walker (2 wheeled) Transfers: Sit to/from Stand Sit to Stand: Supervision General transfer comment: Cues for hand placement and overall safety    Balance Overall balance assessment: Needs assistance Sitting-balance support: No upper extremity supported;Feet supported Sitting balance-Leahy Scale: Good     Standing balance support: Bilateral upper extremity supported;During functional activity Standing balance-Leahy Scale: Fair     ADL Overall ADL's : Needs  assistance/impaired Eating/Feeding: Independent;Sitting   Grooming: Supervision/safety;Standing   Upper Body Bathing: Set up;Sitting   Lower Body Bathing: Moderate assistance;Sit to/from stand   Upper Body Dressing : Set up;Sitting   Lower Body Dressing: Moderate assistance;Sit to/from stand   Toilet Transfer: Supervision/safety;BSC;RW;Ambulation   Toileting- Water quality scientist and Hygiene: Supervision/safety;Sit to/from stand       Functional mobility during ADLs: Supervision/safety General ADL Comments: Patient unable to reach RLE for LB ADLs. Educated patient on AE for LB ADLs and patient interested, plan to introduce and demonstrate AE usage during next OT session prior to patient's d/c>home. Patient overall supervision for functional mobility/transfers using RW. Also plan to educate patient on transfers in/out of tub/shower using BSC.     Pertinent Vitals/Pain Pain Assessment: 0-10 Pain Score: 6  Pain Location: right knee Pain Descriptors / Indicators: Aching;Throbbing Pain Intervention(s): Monitored during session;Repositioned     Hand Dominance Right   Extremity/Trunk Assessment Upper Extremity Assessment Upper Extremity Assessment: Overall WFL for tasks assessed   Lower Extremity Assessment Lower Extremity Assessment: Defer to PT evaluation   Cervical / Trunk Assessment Cervical / Trunk Assessment: Normal   Communication Communication Communication: No difficulties   Cognition Arousal/Alertness: Awake/alert Behavior During Therapy: WFL for tasks assessed/performed Overall Cognitive Status: Within Functional Limits for tasks assessed             Home Living Family/patient expects to be discharged to:: Private residence Living Arrangements: Spouse/significant other;Children (wife and daughter) Available Help at Discharge: Family;Available 24 hours/day (wife is diabled, on a heart transplant list) Type of Home: House Home Access: Stairs to  enter [employer] of Steps: 3   Home Layout: Two level;Bed/bath upstairs Alternate Level Stairs-Number of Steps: flight Alternate Level Stairs-Rails: Can reach both Bathroom Shower/Tub: Tub/shower unit;Curtain   Biochemist, clinical:  Standard     Home Equipment: Bedside commode;Walker - 2 wheels   Additional Comments: Pt states he plans to sleep on the couch and take sponge baths post discharge until he is able to tolerate stairs      Prior Functioning/Environment Level of Independence: Independent     OT Diagnosis: Generalized weakness;Acute pain   OT Problem List: Decreased strength;Decreased range of motion;Decreased activity tolerance;Impaired balance (sitting and/or standing);Decreased safety awareness;Decreased knowledge of use of DME or AE;Decreased knowledge of precautions;Pain   OT Treatment/Interventions: Self-care/ADL training;Therapeutic exercise;Energy conservation;DME and/or AE instruction;Therapeutic activities;Patient/family education;Balance training    OT Goals(Current goals can be found in the care plan section) Acute Rehab OT Goals Patient Stated Goal: "get my foley out" OT Goal Formulation: With patient Time For Goal Achievement: 07/01/14 Potential to Achieve Goals: Good ADL Goals Pt Will Perform Grooming: with modified independence;standing Pt Will Perform Upper Body Bathing: Independently;sitting Pt Will Perform Lower Body Bathing: with modified independence;sit to/from stand;with adaptive equipment Pt Will Perform Upper Body Dressing: Independently;sitting Pt Will Perform Lower Body Dressing: with modified independence;sit to/from stand;with adaptive equipment Pt Will Transfer to Toilet: with modified independence;ambulating;bedside commode Pt Will Perform Toileting - Clothing Manipulation and hygiene: with modified independence;sit to/from stand Pt Will Perform Tub/Shower Transfer: Tub transfer;with modified independence;3 in 1;rolling  walker;ambulating Additional ADL Goal #1: Patient will be independent with verbalizing and adhereing to knee precautions 100% of the time.   OT Frequency: Min 2X/week   Barriers to D/C: None known at this time          End of Session Equipment Utilized During Treatment: Rolling walker;Right knee immobilizer  Activity Tolerance: Patient tolerated treatment well Patient left: in chair;with call bell/phone within reach   Time: 0811-0842 OT Time Calculation (min): 31 min Charges:  OT General Charges $OT Visit: 1 Procedure OT Evaluation $Initial OT Evaluation Tier I: 1 Procedure OT Treatments $Self Care/Home Management : 8-22 mins  Levita Monical , MS, OTR/L, CLT Pager: [phone]  06/24/2014, 8:56 AM

## 2014-06-24 NOTE — Progress Notes (Signed)
ANTICOAGULATION CONSULT NOTE - Follow-up Consult  Pharmacy Consult for warfarin Indication: VTE prophylaxis  No Known Allergies  Patient Measurements:    Vital Signs: Temp: 98.9 F (37.2 C) (03/02 0708) Temp Source: Oral (03/02 0708) BP: 92/59 mmHg (03/02 0708) Pulse Rate: 97 (03/02 0708)  Labs:  Recent Labs  06/24/14 0649  HGB 10.7*  HCT 32.4*  PLT 287  LABPROT 13.9  INR 1.06  CREATININE 1.64*    Estimated Creatinine Clearance: 67.5 mL/min (by C-G formula based on Cr of 1.64).  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 INR 1.06 past first 10mg  dose of coumadin given. No baseline done. Hgb down a bit, plt ok. No bleeding noted.  Goal of Therapy:  INR 2-3 Monitor platelets by anticoagulation protocol: Yes   Plan:  Coumadin 10mg  po again tonight Daily INR  Sherlon Handing, PharmD, BCPS Clinical pharmacist, pager [phone] 06/24/2014,8:31 AM

## 2014-06-25 LAB — CBC
HEMATOCRIT: 30.3 % — AB (ref 39.0–52.0)
Hemoglobin: 10.2 g/dL — ABNORMAL LOW (ref 13.0–17.0)
MCH: 31 pg (ref 26.0–34.0)
MCHC: 33.7 g/dL (ref 30.0–36.0)
MCV: 92.1 fL (ref 78.0–100.0)
Platelets: 222 10*3/uL (ref 150–400)
RBC: 3.29 MIL/uL — AB (ref 4.22–5.81)
RDW: 13.2 % (ref 11.5–15.5)
WBC: 7.4 10*3/uL (ref 4.0–10.5)

## 2014-06-25 LAB — PROTIME-INR
INR: 1.34 (ref 0.00–1.49)
Prothrombin Time: 16.7 seconds — ABNORMAL HIGH (ref 11.6–15.2)

## 2014-06-25 MED ORDER — WARFARIN SODIUM 5 MG PO TABS
10.0000 mg | ORAL_TABLET | Freq: Once | ORAL | Status: AC
  Administered 2014-06-25: 10 mg via ORAL
  Filled 2014-06-25: qty 2

## 2014-06-25 MED ORDER — OXYCODONE HCL ER 10 MG PO T12A
10.0000 mg | EXTENDED_RELEASE_TABLET | Freq: Two times a day (BID) | ORAL | Status: DC
  Administered 2014-06-25 – 2014-06-26 (×3): 10 mg via ORAL
  Filled 2014-06-25 (×3): qty 1

## 2014-06-25 MED ORDER — ACETAMINOPHEN 325 MG PO TABS
650.0000 mg | ORAL_TABLET | Freq: Four times a day (QID) | ORAL | Status: DC | PRN
  Administered 2014-06-25 – 2014-06-26 (×4): 650 mg via ORAL
  Filled 2014-06-25 (×4): qty 2

## 2014-06-25 NOTE — Progress Notes (Signed)
ANTICOAGULATION CONSULT NOTE - Follow-up Consult  Pharmacy Consult for warfarin Indication: VTE prophylaxis  No Known Allergies  Patient Measurements:    Vital Signs: Temp: 99.6 F (37.6 C) (03/03 0604) Temp Source: Oral (03/03 0604) BP: 158/84 mmHg (03/03 0604) Pulse Rate: 103 (03/03 0604)  Labs:  Recent Labs  06/24/14 0649 06/25/14 0548  HGB 10.7* 10.2*  HCT 32.4* 30.3*  PLT 287 222  LABPROT 13.9 16.7*  INR 1.06 1.34  CREATININE 1.64*  --     Estimated Creatinine Clearance: 67.5 mL/min (by C-G formula based on Cr of 1.64).  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 INR moving nicely - up to 1.34 on 10mg  coumadin x 2 days. Hgb down a bit, plt ok. No bleeding noted.  Goal of Therapy:  INR 2-3 Monitor platelets by anticoagulation protocol: Yes   Plan:  Coumadin 10mg  po again tonight Daily INR  Sherlon Handing, PharmD, BCPS Clinical pharmacist, pager [phone] 06/25/2014,10:09 AM

## 2014-06-25 NOTE — Progress Notes (Signed)
Physical Therapy Treatment Patient Details Name: [name] MRN: [id number] DOB: [birth year] Today's Date: 06/25/2014    History of Present Illness 49 yo old male s/p RIGHT  TOTAL KNEE ARTHROPLASTY    PT Comments    Pt is progressing well with his PT goals and is anticipating d/c to home w/ HHPT tomorrow or Sat.  Pt will be seen again tomorrow to continue ambulatory activities and therapeutic exercises to increase pt's functional independence.     Follow Up Recommendations  Home health PT;Supervision/Assistance - 24 hour     Equipment Recommendations  None recommended by PT    Recommendations for Other Services       Precautions / Restrictions Precautions Precautions: Knee Restrictions Weight Bearing Restrictions: Yes RLE Weight Bearing: Weight bearing as tolerated    Mobility  Bed Mobility Overal bed mobility: Needs Assistance Bed Mobility: Supine to Sit;Sit to Supine     Supine to sit: Modified independent (Device/Increase time) Sit to supine: Modified independent (Device/Increase time) (hook leg technique; increased time)   General bed mobility comments: L leg hook under R leg to get in and out of bed; increased time  Transfers Overall transfer level: Needs assistance Equipment used: Rolling walker (2 wheeled) Transfers: Sit to/from Stand Sit to Stand: Supervision         General transfer comment: verbal cue to be sure RW is in front of pt so it will be there when he stands  Ambulation/Gait Ambulation/Gait assistance: Supervision Ambulation Distance (Feet): 200 Feet Assistive device: Rolling walker (2 wheeled) Gait Pattern/deviations: Decreased step length - right;Decreased step length - left;Step-through pattern;Antalgic Gait velocity: decreased Gait velocity interpretation: Below normal speed for age/gender     Stairs            Wheelchair Mobility    Modified Rankin (Stroke Patients Only)       Balance Overall balance assessment:  Needs assistance Sitting-balance support: Feet supported;No upper extremity supported Sitting balance-Leahy Scale: Good     Standing balance support: Bilateral upper extremity supported Standing balance-Leahy Scale: Fair                      Cognition Arousal/Alertness: Awake/alert Behavior During Therapy: WFL for tasks assessed/performed Overall Cognitive Status: Within Functional Limits for tasks assessed                      Exercises Total Joint Exercises Ankle Circles/Pumps: AROM;Both;10 reps;Supine Short Arc Quad: 10 reps;Right;AROM;Supine Heel Slides: 10 reps;Supine;Right;AROM Hip ABduction/ADduction: 5 reps;Supine;AROM;Right Straight Leg Raises: 5 reps;Supine;Right;AROM Knee Flexion: 5 reps;Right;Seated;AROM    General Comments        Pertinent Vitals/Pain Pain Assessment: 0-10 Pain Score: 5  Pain Location: R knee Pain Descriptors / Indicators: Aching;Burning;Discomfort Pain Intervention(s): Limited activity within patient's tolerance;Monitored during session;Repositioned;Ice applied    Home Living                      Prior Function            PT Goals (current goals can now be found in the care plan section) Acute Rehab PT Goals Patient Stated Goal: stop hurting Progress towards PT goals: Progressing toward goals    Frequency  7X/week    PT Plan Current plan remains appropriate    Co-evaluation             End of Session Equipment Utilized During Treatment: Gait belt Activity Tolerance: Patient tolerated treatment well Patient left:  in bed;with call bell/phone within reach;with family/visitor present;with SCD's reapplied     Time: 9833-8250 PT Time Calculation (min) (ACUTE ONLY): 20 min  Charges:  $Gait Training: 8-22 mins                    G CodesJoslyn Hy PT, [place] [phone] 06/25/2014, 4:15 PM

## 2014-06-25 NOTE — Progress Notes (Signed)
Subjective: Pt stable - c/o headache  Objective: Vital signs in last 24 hours: Temp:  [99.6 F (37.6 C)-101.6 F (38.7 C)] 99.6 F (37.6 C) (03/03 0604) Pulse Rate:  [103-120] 103 (03/03 0604) Resp:  [16] 16 (03/03 0400) BP: (132-158)/(56-84) 158/84 mmHg (03/03 0604) SpO2:  [94 %-98 %] 96 % (03/03 0604)  Intake/Output from previous day: 03/02 0701 - 03/03 0700 In: 960 [P.O.:960] Out: 4200 [Urine:4200] Intake/Output this shift:    Exam:  Sensation intact distally Dorsiflexion/Plantar flexion intact Compartment soft  Labs:  Recent Labs  06/24/14 0649 06/25/14 0548  HGB 10.7* 10.2*    Recent Labs  06/24/14 0649 06/25/14 0548  WBC 9.2 7.4  RBC 3.41* 3.29*  HCT 32.4* 30.3*  PLT 287 222    Recent Labs  06/24/14 0649  NA 135  K 5.3*  CL 101  CO2 26  BUN 22  CREATININE 1.64*  GLUCOSE 113*  CALCIUM 7.9*    Recent Labs  06/24/14 0649 06/25/14 0548  INR 1.06 1.34    Assessment/Plan: Plan pt and cpm today - dc iv dilaudid - possible dc am   DEAN,GREGORY SCOTT 06/25/2014, 8:09 AM

## 2014-06-25 NOTE — Progress Notes (Signed)
Physical Therapy Treatment Patient Details Name: [name] MRN: [id number] DOB: 14-Mar-1966 Today's Date: 06/25/2014    History of Present Illness 49 yo old male s/p RIGHT LOOSE TOTAL KNEE ARTHROPLASTY    PT Comments    Pt was able to navigate 2 steps and ambulated 200 ft today.  Tolerated R TKA exercises well today.  Pt is expecting d/c to home tomorrow or Saturday.  Follow Up Recommendations  Home health PT;Supervision/Assistance - 24 hour     Equipment Recommendations  None recommended by PT    Recommendations for Other Services       Precautions / Restrictions Precautions Precautions: Knee Precaution Comments: Reviewed knee precautions and importance of no pillow under knee Required Braces or Orthoses:  (ambulated w/o KI this session) Restrictions Weight Bearing Restrictions: Yes RLE Weight Bearing: Weight bearing as tolerated    Mobility  Bed Mobility Overal bed mobility: Modified Independent Bed Mobility: Supine to Sit     Supine to sit: Modified independent (Device/Increase time) (increased time)        Transfers Overall transfer level: Needs assistance Equipment used: Rolling walker (2 wheeled) Transfers: Sit to/from Stand Sit to Stand: Supervision         General transfer comment: verbal cues to bend knee to tolerance during sit<>stand  Ambulation/Gait Ambulation/Gait assistance: Min guard Ambulation Distance (Feet): 200 Feet Assistive device: Rolling walker (2 wheeled) Gait Pattern/deviations: Step-to pattern;Decreased step length - left;Decreased stance time - right;Antalgic Gait velocity: decreased Gait velocity interpretation: Below normal speed for age/gender General Gait Details: pt able to gradually increase amount of WB through RLE   Stairs Stairs: Yes Stairs assistance: Min guard Stair Management: One rail Right;Forwards;Step to pattern Number of Stairs: 2 General stair comments: Pt grasping railing tightly using BUE strength  to dec WB RLE  Wheelchair Mobility    Modified Rankin (Stroke Patients Only)       Balance Overall balance assessment: Needs assistance Sitting-balance support: Feet supported;No upper extremity supported Sitting balance-Leahy Scale: Good     Standing balance support: Bilateral upper extremity supported Standing balance-Leahy Scale: Fair                      Cognition Arousal/Alertness: Awake/alert Behavior During Therapy: WFL for tasks assessed/performed Overall Cognitive Status: Within Functional Limits for tasks assessed                      Exercises Total Joint Exercises Towel Squeeze: Right;10 reps;Seated;AROM (5 second holds) Short Arc [employer]: AROM;AAROM;10 reps;Seated;Right Heel Slides: AROM;AAROM;Right;10 reps;Seated Knee Flexion: AROM;AAROM;Right;10 reps;Seated Goniometric ROM: 8-76 Marching in Standing: AROM;Seated;20 reps;Both (**Marching in sitting)    General Comments        Pertinent Vitals/Pain Pain Assessment: 0-10 Pain Score: 5  Pain Location: R knee Pain Descriptors / Indicators: Aching;Throbbing;Guarding;Grimacing;Discomfort Pain Intervention(s): Limited activity within patient's tolerance;Monitored during session;Repositioned;Patient requesting pain meds-RN notified;RN gave pain meds during session (notified RN of pt's request for tylenol, RN administered)    Home Living                      Prior Function            PT Goals (current goals can now be found in the care plan section) Acute Rehab PT Goals Patient Stated Goal: stop hurting Progress towards PT goals: Progressing toward goals    Frequency  7X/week    PT Plan Current plan remains appropriate    Co-evaluation  End of Session Equipment Utilized During Treatment: Gait belt Activity Tolerance: Patient limited by pain Patient left: in chair;with call bell/phone within reach;with family/visitor present     Time: 4859-2763 PT Time  Calculation (min) (ACUTE ONLY): 45 min  Charges:  $Gait Training: 23-37 mins $Therapeutic Exercise: 8-22 mins                    G CodesJoslyn Hy PT, [place] [phone] 06/25/2014, 9:36 AM

## 2014-06-25 NOTE — Clinical Social Work Note (Signed)
CSW Consult Acknowledged:   Covering CSW received a consult for SNF placement. Covering CSW reviewed chart, per  PT evaluation the appropriate level of care Home Health PT. Covering CSW will sign off please consult if SNF is needed.   [place], MSW, [place]

## 2014-06-25 NOTE — Progress Notes (Signed)
Occupational Therapy Treatment Patient Details Name: [name] MRN: [id number] DOB: 08-29-1965 Today's Date: 06/25/2014    History of present illness 49 yo old male s/p RIGHT  TOTAL KNEE ARTHROPLASTY   OT comments  All education completed regarding compensatory techniques and use of AE and DME for ADL and functional mobility. OT goals appropriate for D/C. Pt limited today by pain. OT signing off.   Follow Up Recommendations  No OT follow up;Supervision - Intermittent    Equipment Recommendations  Other (comment)    Recommendations for Other Services      Precautions / Restrictions Precautions Precautions: Knee Restrictions RLE Weight Bearing: Weight bearing as tolerated       Mobility Bed Mobility Overal bed mobility: Needs Assistance             General bed mobility comments: demonstrated how to use L foot under leg to slide leg on/off bed  Transfers       Sit to Stand: Supervision              Balance    S RW level                               ADL                                       Functional mobility during ADLs: Supervision/safety General ADL Comments: Completed education regarding use of AE for LB ADL. Educated wife on tub transfer techniques using 3 in 1. Wife verbalized understanding. Educated pt at bed level due to pain. Pt verbalized understanding and stated he did not need to perform transfer tomorrow.                                       Cognition   Behavior During Therapy: WFL for tasks assessed/performed Overall Cognitive Status: Within Functional Limits for tasks assessed                                                       Pertinent Vitals/ Pain       Pain Assessment: 0-10 Pain Score: 8  Pain Location: R knee Pain Descriptors / Indicators: Aching;Constant;Grimacing Pain Intervention(s): Limited activity within patient's tolerance;Monitored during  session;Repositioned;Ice applied  Home Living                                          Prior Functioning/Environment              Frequency       Progress Toward Goals  OT Goals(current goals can now be found in the care plan section)  Progress towards OT goals: Goals met/education completed, patient discharged from OT (appropriate for D/C)  Acute Rehab OT Goals Patient Stated Goal: stop hurting OT Goal Formulation: With patient Time For Goal Achievement: 07/01/14 Potential to Achieve Goals: Good ADL Goals Pt Will Perform Grooming: with modified independence;standing Pt Will Perform Upper Body Bathing: Independently;sitting Pt Will Perform Lower Body Bathing:  with modified independence;sit to/from stand;with adaptive equipment Pt Will Perform Upper Body Dressing: Independently;sitting Pt Will Perform Lower Body Dressing: with modified independence;sit to/from stand;with adaptive equipment Pt Will Transfer to Toilet: with modified independence;ambulating;bedside commode Pt Will Perform Toileting - Clothing Manipulation and hygiene: with modified independence;sit to/from stand Pt Will Perform Tub/Shower Transfer: Tub transfer;with modified independence;3 in 1;rolling walker;ambulating Additional ADL Goal #1: Patient will be independent with verbalizing and adhereing to knee precautions 100% of the time.   Plan Discharge plan remains appropriate;Other (comment) (goals appropriate for D/C)    Co-evaluation                 End of Session Equipment Utilized During Treatment: Rolling walker   Activity Tolerance Patient limited by pain   Patient Left in bed;with call bell/phone within reach;with family/visitor present   Nurse Communication Mobility status;Patient requests pain meds        Time: 5189-8421 OT Time Calculation (min): 23 min  Charges: OT General Charges $OT Visit: 1 Procedure OT Treatments $Self Care/Home Management : 23-37  mins  [name],[name] 06/25/2014, 1:19 PM   [facility], OTR/L  [phone] 06/25/2014

## 2014-06-26 LAB — CBC
HEMATOCRIT: 28.5 % — AB (ref 39.0–52.0)
HEMOGLOBIN: 9.6 g/dL — AB (ref 13.0–17.0)
MCH: 31.2 pg (ref 26.0–34.0)
MCHC: 33.7 g/dL (ref 30.0–36.0)
MCV: 92.5 fL (ref 78.0–100.0)
Platelets: 221 10*3/uL (ref 150–400)
RBC: 3.08 MIL/uL — AB (ref 4.22–5.81)
RDW: 13.2 % (ref 11.5–15.5)
WBC: 8.9 10*3/uL (ref 4.0–10.5)

## 2014-06-26 LAB — PROTIME-INR
INR: 1.62 — ABNORMAL HIGH (ref 0.00–1.49)
PROTHROMBIN TIME: 19.4 s — AB (ref 11.6–15.2)

## 2014-06-26 MED ORDER — OXYCODONE HCL 5 MG PO TABS: 5.0000 mg | ORAL_TABLET | ORAL | Status: DC | PRN

## 2014-06-26 MED ORDER — METHOCARBAMOL 500 MG PO TABS: 500.0000 mg | ORAL_TABLET | Freq: Three times a day (TID) | ORAL | Status: DC | PRN

## 2014-06-26 MED ORDER — WARFARIN SODIUM 5 MG PO TABS: 10.0000 mg | ORAL_TABLET | Freq: Once | ORAL | Status: DC

## 2014-06-26 MED ORDER — WARFARIN SODIUM 5 MG PO TABS: 5.0000 mg | ORAL_TABLET | Freq: Every day | ORAL | Status: DC

## 2014-06-26 MED ORDER — OXYCODONE HCL ER 10 MG PO T12A: 10.0000 mg | EXTENDED_RELEASE_TABLET | Freq: Two times a day (BID) | ORAL | Status: DC

## 2014-06-26 NOTE — Progress Notes (Signed)
Subjective: Pt stable - doing ok on cpm   Objective: Vital signs in last 24 hours: Temp:  [98.7 F (37.1 C)-99.1 F (37.3 C)] 98.7 F (37.1 C) (03/04 0603) Pulse Rate:  [51-95] 94 (03/04 0603) Resp:  [14-16] 16 (03/04 0603) BP: (119-165)/(68-86) 119/68 mmHg (03/04 0603) SpO2:  [90 %-93 %] 90 % (03/04 0603)  Intake/Output from previous day: 03/03 0701 - 03/04 0700 In: 720 [P.O.:720] Out: 1500 [Urine:1500] Intake/Output this shift:    Exam:  Neurovascular intact Sensation intact distally Intact pulses distally  Labs:  Recent Labs  06/24/14 0649 06/25/14 0548 06/26/14 0615  HGB 10.7* 10.2* 9.6*    Recent Labs  06/25/14 0548 06/26/14 0615  WBC 7.4 8.9  RBC 3.29* 3.08*  HCT 30.3* 28.5*  PLT 222 221    Recent Labs  06/24/14 0649  NA 135  K 5.3*  CL 101  CO2 26  BUN 22  CREATININE 1.64*  GLUCOSE 113*  CALCIUM 7.9*    Recent Labs  06/25/14 0548 06/26/14 0615  INR 1.34 1.62*    Assessment/Plan: Plan dc today or sat pending pain control   Torrence Hammack SCOTT 06/26/2014, 8:19 AM

## 2014-06-26 NOTE — Progress Notes (Signed)
Physical Therapy Treatment Patient Details Name: [name] MRN: [id number] DOB: July 10, 1965 Today's Date: 06/26/2014    History of Present Illness 49 yo old male s/p RIGHT  TOTAL KNEE ARTHROPLASTY    PT Comments    Pt is progressing well w/ PT exercises and gait improved following verbal cues to focus on heel strike and toe off.  Pt is anticipating d/c either today or tomorrow depending on pt's pain status.    Follow Up Recommendations  Home health PT;Supervision/Assistance - 24 hour     Equipment Recommendations  None recommended by PT    Recommendations for Other Services       Precautions / Restrictions Precautions Precautions: Knee Restrictions Weight Bearing Restrictions: Yes RLE Weight Bearing: Weight bearing as tolerated    Mobility  Bed Mobility Overal bed mobility: Modified Independent Bed Mobility: Supine to Sit;Sit to Supine     Supine to sit: Modified independent (Device/Increase time) Sit to supine: Modified independent (Device/Increase time)   General bed mobility comments:  (using LLE to hook under R to bring RLE onto/off of bed)  Transfers Overall transfer level: Needs assistance Equipment used: Rolling walker (2 wheeled) Transfers: Sit to/from Stand Sit to Stand: Supervision         General transfer comment: verbal cue to be sure RW is in front of pt so it will be there when he stands  Ambulation/Gait Ambulation/Gait assistance: Supervision Ambulation Distance (Feet): 220 Feet Assistive device: Rolling walker (2 wheeled) Gait Pattern/deviations: Step-through pattern;Antalgic;Decreased step length - right;Decreased step length - left Gait velocity: decreased Gait velocity interpretation: Below normal speed for age/gender General Gait Details:  verbal cues to keep RW close while ambulating and to practice heel strike and toe off which pt was able to demonstrate following instruction   Stairs            Wheelchair Mobility     Modified Rankin (Stroke Patients Only)       Balance Overall balance assessment: Needs assistance Sitting-balance support: No upper extremity supported;Feet supported Sitting balance-Leahy Scale: Good     Standing balance support: No upper extremity supported Standing balance-Leahy Scale: Fair                      Cognition Arousal/Alertness: Awake/alert Behavior During Therapy: WFL for tasks assessed/performed Overall Cognitive Status: Within Functional Limits for tasks assessed                      Exercises Total Joint Exercises Ankle Circles/Pumps: AROM;Both;10 reps;Supine Towel Squeeze: Right;10 reps;Seated;AROM (5 second holds) Short Arc Quad: 10 reps;Right;AROM;Supine Heel Slides: 10 reps;Supine;Right;AROM Hip ABduction/ADduction: 10 reps;Right;AROM;Supine Straight Leg Raises: 10 reps;Right;AROM;Supine Knee Flexion: 10 reps;Seated;AROM;Right Goniometric ROM: 6-81    General Comments General comments (skin integrity, edema, etc.): Doctor visited during session and PT communicated to doctor that pt was progressing well      Pertinent Vitals/Pain Pain Assessment: 0-10 Pain Score: 8  Pain Location: R knee Pain Descriptors / Indicators: Aching;Discomfort;Headache Pain Intervention(s): Limited activity within patient's tolerance;Monitored during session;Repositioned    Home Living                      Prior Function            PT Goals (current goals can now be found in the care plan section) Acute Rehab PT Goals Patient Stated Goal: stop hurting Progress towards PT goals: Progressing toward goals    Frequency  7X/week  PT Plan Current plan remains appropriate    Co-evaluation             End of Session Equipment Utilized During Treatment: Gait belt Activity Tolerance: Patient tolerated treatment well;Patient limited by fatigue Patient left: in bed;in CPM;with call bell/phone within reach;with family/visitor present  (pt instructed to continue ankle pumps)     Time: 2620-3559 PT Time Calculation (min) (ACUTE ONLY): 30 min  Charges:  $Gait Training: 8-22 mins $Therapeutic Exercise: 8-22 mins                     G CodesJoslyn Hy PT, [place] [phone] #[id number] 06/26/2014, 10:55 AM

## 2014-06-26 NOTE — Progress Notes (Signed)
ANTICOAGULATION CONSULT NOTE - Follow Up Consult  Pharmacy Consult for coumadin Indication: VTE prophylaxis  No Known Allergies   Vital Signs: Temp: 98.7 F (37.1 C) (03/04 0603) BP: 119/68 mmHg (03/04 0603) Pulse Rate: 94 (03/04 0603)  Labs:  Recent Labs  06/24/14 0649 06/25/14 0548 06/26/14 0615  HGB 10.7* 10.2* 9.6*  HCT 32.4* 30.3* 28.5*  PLT 287 222 221  LABPROT 13.9 16.7* 19.4*  INR 1.06 1.34 1.62*  CREATININE 1.64*  --   --     Estimated Creatinine Clearance: 67.5 mL/min (by C-G formula based on Cr of 1.64).  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  INR is sub-therapeutic but is trending up nicely to 1.62 today after three doses of 10mg .  Hgb down slightly to 9.6, plt ok.  No bleeding documented.  Goal of Therapy:  INR 2-3    Plan:  - Repeat coumadin 10mg  PO x1 today - d/c home soon  Lynelle Doctor 06/26/2014,11:07 AM

## 2014-06-26 NOTE — Discharge Summary (Signed)
Physician Discharge Summary  Patient ID: [name] MRN: [id number] DOB/AGE: 10/02/1965 49 y.o.  Admit date: 06/23/2014 Discharge date: 06/26/2014  Admission Diagnoses:  Active Problems:   Arthritis of knee loose TKA right  Discharge Diagnoses:  Same  Surgeries: Procedure(s): TOTAL KNEE REVISION on 06/23/2014   Consultants:    Discharged Condition: Stable  Hospital Course: [name] is an 49 y.o. male who was admitted 06/23/2014 with a chief complaint of right knee pain, and found to have a diagnosis of loose TKA.  They were brought to the operating room on 06/23/2014 and underwent the above named procedures.  Revision went well.Pain ok on dc - incision ok also - will be wbat and f/u 10 days  Antibiotics given:  Anti-infectives    Start     Dose/Rate Route Frequency Ordered Stop   06/24/14 0200  ceFAZolin (ANCEF) IVPB 1 g/50 mL premix     1 g 100 mL/hr over 30 Minutes Intravenous 3 times per day 06/23/14 2043 06/24/14 1026   06/23/14 0600  ceFAZolin (ANCEF) IVPB 2 g/50 mL premix     2 g 100 mL/hr over 30 Minutes Intravenous On call to O.R. 06/22/14 1409 06/23/14 1803    .  Recent vital signs:  Filed Vitals:   06/26/14 1116  BP: 119/68  Pulse:   Temp:   Resp:     Recent laboratory studies:  Results for orders placed or performed during the hospital encounter of 06/23/14  Tissue culture  Result Value Ref Range   Specimen Description TISSUE KNEE RIGHT    Special Requests NONE    Gram Stain      NO WBC SEEN NO ORGANISMS SEEN Performed at [facility]    Culture      NO GROWTH 2 DAYS Performed at [facility]    Report Status PENDING   Anaerobic culture  Result Value Ref Range   Specimen Description FLUID SYNOVIAL KNEE RIGHT    Special Requests NONE    Gram Stain      FEW WBC PRESENT,BOTH PMN AND MONONUCLEAR NO ORGANISMS SEEN Performed at [place]; CULTURE IN PROGRESS FOR 5  DAYS Performed at [facility]    Report Status PENDING   Body fluid culture  Result Value Ref Range   Specimen Description FLUID SYNOVIAL KNEE RIGHT    Special Requests NONE    Gram Stain      FEW WBC PRESENT,BOTH PMN AND MONONUCLEAR NO ORGANISMS SEEN Performed at [facility]    Culture      NO GROWTH 2 DAYS Performed at [facility]    Report Status PENDING   Tissue culture  Result Value Ref Range   Specimen Description TISSUE KNEE RIGHT    Special Requests NONE    Gram Stain      NO WBC SEEN NO ORGANISMS SEEN Performed at [facility]    Culture      NO GROWTH 2 DAYS Performed at [facility]    Report Status PENDING   Glucose, capillary  Result Value Ref Range   Glucose-Capillary 114 (H) 70 - 99 mg/dL  Glucose, capillary  Result Value Ref Range   Glucose-Capillary 114 (H) 70 - 99 mg/dL  Glucose, capillary  Result Value Ref Range   Glucose-Capillary 94 70 - 99 mg/dL  CBC  Result Value Ref Range   WBC 9.2 4.0 - 10.5  K/uL   RBC 3.41 (L) 4.22 - 5.81 MIL/uL   Hemoglobin 10.7 (L) 13.0 - 17.0 g/dL   HCT 32.4 (L) 39.0 - 52.0 %   MCV 95.0 78.0 - 100.0 fL   MCH 31.4 26.0 - 34.0 pg   MCHC 33.0 30.0 - 36.0 g/dL   RDW 13.3 11.5 - 15.5 %   Platelets 287 150 - 400 K/uL  Basic metabolic panel  Result Value Ref Range   Sodium 135 135 - 145 mmol/L   Potassium 5.3 (H) 3.5 - 5.1 mmol/L   Chloride 101 96 - 112 mmol/L   CO2 26 19 - 32 mmol/L   Glucose, Bld 113 (H) 70 - 99 mg/dL   BUN 22 6 - 23 mg/dL   Creatinine, Ser 1.64 (H) 0.50 - 1.35 mg/dL   Calcium 7.9 (L) 8.4 - 10.5 mg/dL   GFR calc non Af Amer 48 (L) >90 mL/min   GFR calc Af Amer 55 (L) >90 mL/min   Anion gap 8 5 - 15  Protime-INR  Result Value Ref Range   Prothrombin Time 13.9 11.6 - 15.2 seconds   INR 1.06 0.00 - 1.49  Glucose, capillary  Result Value Ref Range   Glucose-Capillary 126 (H) 70 - 99 mg/dL  Protime-INR  Result Value Ref Range   Prothrombin Time 16.7  (H) 11.6 - 15.2 seconds   INR 1.34 0.00 - 1.49  CBC  Result Value Ref Range   WBC 7.4 4.0 - 10.5 K/uL   RBC 3.29 (L) 4.22 - 5.81 MIL/uL   Hemoglobin 10.2 (L) 13.0 - 17.0 g/dL   HCT 30.3 (L) 39.0 - 52.0 %   MCV 92.1 78.0 - 100.0 fL   MCH 31.0 26.0 - 34.0 pg   MCHC 33.7 30.0 - 36.0 g/dL   RDW 13.2 11.5 - 15.5 %   Platelets 222 150 - 400 K/uL  Protime-INR  Result Value Ref Range   Prothrombin Time 19.4 (H) 11.6 - 15.2 seconds   INR 1.62 (H) 0.00 - 1.49  CBC  Result Value Ref Range   WBC 8.9 4.0 - 10.5 K/uL   RBC 3.08 (L) 4.22 - 5.81 MIL/uL   Hemoglobin 9.6 (L) 13.0 - 17.0 g/dL   HCT 28.5 (L) 39.0 - 52.0 %   MCV 92.5 78.0 - 100.0 fL   MCH 31.2 26.0 - 34.0 pg   MCHC 33.7 30.0 - 36.0 g/dL   RDW 13.2 11.5 - 15.5 %   Platelets 221 150 - 400 K/uL    Discharge Medications:     Medication List    STOP taking these medications        amoxicillin 500 MG capsule  Commonly known as:  AMOXIL     aspirin 81 MG EC tablet     gabapentin 100 MG capsule  Commonly known as:  NEURONTIN     HYDROcodone-acetaminophen 10-325 MG per tablet  Commonly known as:  NORCO     HYDROcodone-acetaminophen 7.5-325 MG per tablet  Commonly known as:  NORCO     ibuprofen 200 MG tablet  Commonly known as:  ADVIL,MOTRIN     traMADol 50 MG tablet  Commonly known as:  ULTRAM      TAKE these medications        ALPRAZolam 0.5 MG tablet  Commonly known as:  XANAX  TAKE 1 TABLET BY MOUTH AT BEDTIME AS NEEDED     BAYER MICROLET LANCETS lancets  Use as instructed     buPROPion 150 MG  12 hr tablet  Commonly known as:  WELLBUTRIN SR  Take 1 tablet (150 mg total) by mouth 2 (two) times daily.     citalopram 20 MG tablet  Commonly known as:  CELEXA  TAKE 1 TABLET BY MOUTH DAILY.     clidinium-chlordiazePOXIDE 5-2.5 MG per capsule  Commonly known as:  LIBRAX  Take 1 capsule by mouth 3 (three) times daily before meals.     levothyroxine 88 MCG tablet  Commonly known as:  SYNTHROID, LEVOTHROID   Take 1 tablet (88 mcg total) by mouth daily before breakfast.     lisinopril-hydrochlorothiazide 20-25 MG per tablet  Commonly known as:  PRINZIDE,ZESTORETIC  Take 1 tablet by mouth daily.     methocarbamol 500 MG tablet  Commonly known as:  ROBAXIN  Take 1 tablet (500 mg total) by mouth every 8 (eight) hours as needed for muscle spasms.     metoprolol tartrate 25 MG tablet  Commonly known as:  LOPRESSOR  Take 1 tablet (25 mg total) by mouth 2 (two) times daily.     multivitamin tablet  Take 1 tablet by mouth daily.     omeprazole 40 MG capsule  Commonly known as:  PRILOSEC  TAKE ONE CAPSULE BY MOUTH TWICE A DAY     oxyCODONE 5 MG immediate release tablet  Commonly known as:  Oxy IR/ROXICODONE  Take 1-2 tablets (5-10 mg total) by mouth every 3 (three) hours as needed for breakthrough pain.     OxyCODONE 10 mg T12a 12 hr tablet  Commonly known as:  OXYCONTIN  Take 1 tablet (10 mg total) by mouth every 12 (twelve) hours.     warfarin 5 MG tablet  Commonly known as:  COUMADIN  Take 1 tablet (5 mg total) by mouth daily.        Diagnostic Studies: Dg Chest 2 View  06/12/2014   CLINICAL DATA:  Hypertension.  EXAM: CHEST  2 VIEW  COMPARISON:  04/10/2013.  FINDINGS: Mediastinum and hilar structures normal. Heart size stable. Stable mild pleural thickening noted bilateral consistent with scarring. Pleural thickening is a slightly nodular component. No interim change. No pneumothorax. No acute bony abnormality.  IMPRESSION: No acute cardiopulmonary disease.   Electronically Signed   By: Marcello Moores  Register   On: 06/12/2014 08:18    Disposition: 01-Home or Self Care      Discharge Instructions    Call MD / Call 911    Complete by:  As directed   If you experience chest pain or shortness of breath, CALL 911 and be transported to the hospital emergency room.  If you develope a fever above 101 F, pus (white drainage) or increased drainage or redness at the wound, or calf pain, call  your surgeon's office.     Constipation Prevention    Complete by:  As directed   Drink plenty of fluids.  Prune juice may be helpful.  You may use a stool softener, such as Colace (over the counter) 100 mg twice a day.  Use MiraLax (over the counter) for constipation as needed.     Diet - low sodium heart healthy    Complete by:  As directed      Discharge instructions    Complete by:  As directed   Remove dressing on Tuesday cpm 4 - 6 hours per day Keep incision dry Weight bearing as tolerated     Increase activity slowly as tolerated    Complete by:  As directed  Follow-up Information    Follow up with [facility]   Why:  Someone from [facility] will contact you concerning start date and time for your physical therapy.   Contact information:   [address] [place] [address] [phone]        Signed: Meredith Pel 06/26/2014, 5:35 PM

## 2014-06-27 ENCOUNTER — Other Ambulatory Visit: Payer: Self-pay | Admitting: Internal Medicine

## 2014-06-27 LAB — BODY FLUID CULTURE: Culture: NO GROWTH

## 2014-06-27 LAB — TISSUE CULTURE
CULTURE: NO GROWTH
Culture: NO GROWTH
GRAM STAIN: NONE SEEN
Gram Stain: NONE SEEN

## 2014-06-28 LAB — ANAEROBIC CULTURE

## 2014-07-06 ENCOUNTER — Other Ambulatory Visit: Payer: Self-pay | Admitting: *Deleted

## 2014-07-06 DIAGNOSIS — M25561 Pain in right knee: Principal | ICD-10-CM

## 2014-07-06 DIAGNOSIS — G8929 Other chronic pain: Secondary | ICD-10-CM

## 2014-07-06 NOTE — Progress Notes (Signed)
error 

## 2014-07-06 NOTE — Telephone Encounter (Signed)
Pt called, requesting refill hydrocodone. Last refill 02/16/14

## 2014-07-07 MED ORDER — HYDROCODONE-ACETAMINOPHEN 7.5-325 MG PO TABS: 1.0000 | ORAL_TABLET | Freq: Four times a day (QID) | ORAL | Status: DC | PRN

## 2014-07-07 NOTE — Telephone Encounter (Signed)
Pt notified Rx ready for pickup 

## 2014-07-07 NOTE — Telephone Encounter (Signed)
Ok to refill,  printed rx  

## 2014-07-09 ENCOUNTER — Telehealth: Payer: Self-pay | Admitting: *Deleted

## 2014-07-09 NOTE — Telephone Encounter (Signed)
[name] from [facility] called states Dr Marlou Sa prescribed Coumadin after knee surgery, pt is now receiving outpatient care and [facility] from [place] requests whether Dr Derrel Nip would manage Coumadin for approximately 2 weeks.  Please advise

## 2014-07-10 NOTE — Telephone Encounter (Signed)
Yes i will manage

## 2014-07-10 NOTE — Telephone Encounter (Signed)
Spoke with Jenny Reichmann, advised of MDs message.  She reports pts last INR was drawn 3.15.16 and it was 1.7 pt is taking 5 mg Coumadin at this time.  His next INR is today.  She will fax all results to [facility] at [phone].

## 2014-07-12 ENCOUNTER — Telehealth: Payer: Self-pay | Admitting: Internal Medicine

## 2014-07-12 DIAGNOSIS — E033 Postinfectious hypothyroidism: Secondary | ICD-10-CM

## 2014-07-12 DIAGNOSIS — D62 Acute posthemorrhagic anemia: Secondary | ICD-10-CM

## 2014-07-12 DIAGNOSIS — E119 Type 2 diabetes mellitus without complications: Secondary | ICD-10-CM

## 2014-07-12 NOTE — Telephone Encounter (Signed)
I am still waiting for the INR from 3/18 from [place].  I only have the one from 3/15 which was 1.7

## 2014-07-13 NOTE — Telephone Encounter (Signed)
Patient is scheduled for this Thursday at this lab prior to receiving message this OK

## 2014-07-13 NOTE — Telephone Encounter (Signed)
Coumadin level is therapeutic,  Continue current regimen and repeat PT/INR in one week 

## 2014-07-13 NOTE — Telephone Encounter (Signed)
Called [facility] and ask Jenny Reichmann concerning PT_INR , awaiting return call.

## 2014-07-13 NOTE — Telephone Encounter (Signed)
INR 2.2 on Friday 07/10/14 Cindy called back from [place] to report and Is to Fax copy. Patient regimen  5 Mg Coumadin.

## 2014-07-13 NOTE — Telephone Encounter (Signed)
Yes Thursday is fine

## 2014-07-15 ENCOUNTER — Other Ambulatory Visit: Payer: Self-pay | Admitting: *Deleted

## 2014-07-15 ENCOUNTER — Other Ambulatory Visit: Payer: Self-pay | Admitting: Physician Assistant

## 2014-07-15 ENCOUNTER — Other Ambulatory Visit: Payer: Self-pay | Admitting: Internal Medicine

## 2014-07-15 MED ORDER — LEVOTHYROXINE SODIUM 88 MCG PO TABS
88.0000 ug | ORAL_TABLET | Freq: Every day | ORAL | Status: DC
Start: 2014-07-15 — End: 2014-11-17

## 2014-07-16 ENCOUNTER — Other Ambulatory Visit: Payer: BLUE CROSS/BLUE SHIELD

## 2014-08-04 ENCOUNTER — Ambulatory Visit (INDEPENDENT_AMBULATORY_CARE_PROVIDER_SITE_OTHER): Payer: BLUE CROSS/BLUE SHIELD | Admitting: Internal Medicine

## 2014-08-04 ENCOUNTER — Encounter: Payer: Self-pay | Admitting: Internal Medicine

## 2014-08-04 VITALS — BP 138/72 | HR 79 | Temp 98.2°F | Resp 12 | Ht 72.0 in | Wt 212.8 lb

## 2014-08-04 DIAGNOSIS — D62 Acute posthemorrhagic anemia: Secondary | ICD-10-CM

## 2014-08-04 DIAGNOSIS — E119 Type 2 diabetes mellitus without complications: Secondary | ICD-10-CM

## 2014-08-04 DIAGNOSIS — E785 Hyperlipidemia, unspecified: Secondary | ICD-10-CM

## 2014-08-04 DIAGNOSIS — M25561 Pain in right knee: Secondary | ICD-10-CM

## 2014-08-04 DIAGNOSIS — E1169 Type 2 diabetes mellitus with other specified complication: Secondary | ICD-10-CM

## 2014-08-04 DIAGNOSIS — K760 Fatty (change of) liver, not elsewhere classified: Secondary | ICD-10-CM

## 2014-08-04 DIAGNOSIS — I10 Essential (primary) hypertension: Secondary | ICD-10-CM | POA: Diagnosis not present

## 2014-08-04 DIAGNOSIS — G8929 Other chronic pain: Secondary | ICD-10-CM | POA: Diagnosis not present

## 2014-08-04 DIAGNOSIS — E669 Obesity, unspecified: Secondary | ICD-10-CM

## 2014-08-04 MED ORDER — HYDROCODONE-ACETAMINOPHEN 7.5-325 MG PO TABS: 1.0000 | ORAL_TABLET | Freq: Four times a day (QID) | ORAL | Status: AC | PRN

## 2014-08-04 MED ORDER — HYDROCODONE-ACETAMINOPHEN 7.5-325 MG PO TABS: 1.0000 | ORAL_TABLET | Freq: Four times a day (QID) | ORAL | Status: DC | PRN

## 2014-08-04 NOTE — Progress Notes (Signed)
Patient ID: [name], male   DOB: 10-Feb-1966, 49 y.o.   MRN: [id number]  Patient Active Problem List   Diagnosis Date Noted  . Arthritis of knee 06/23/2014  . Pre-operative cardiovascular examination 06/17/2014  . Enlarged RV (right ventricle) 06/17/2014  . Radicular pain in left arm 02/17/2014  . Obesity 10/29/2013  . Chronic knee pain 10/29/2013  . Umbilical hernia 13/11/6576  . Diabetes mellitus type 2 in obese 10/23/2012  . Seborrheic dermatitis, unspecified 05/17/2012  . Fatty liver 04/22/2012  . Polyarthralgia 12/24/2010  . Sciatica of right side 12/24/2010  . Hyperlipidemia associated with type 2 diabetes mellitus 01/13/2009  . ANXIETY DEPRESSION 01/13/2009  . Essential hypertension 01/13/2009  . OSA (obstructive sleep apnea) 01/13/2009    Subjective:  CC:   Chief Complaint  Patient presents with  . Follow-up    medication refill    HPI:   [name] is a 49 y.o. male who presents for  Follow up on type 2 Dm  With obesity and CKD.  Marland Kitchen He feels generally well, is exercising several times per week and checking blood sugars once daily at variable times.  BS have been under 130 fasting and < 150 post prandially.  Denies any recent hypoglyemic events.  Taking his medications as directed. Following a carbohydrate modified diet 6 days per week. Denies numbness, burning and tingling of extremities. Appetite is good.  Hast lost 14 lbs intentionally,  despite undergoing knee replacement on March 2 of the  right knee  by dr dean due to tibia loosening of prosthesis 5 hour surgery , including bone graft.      He is still having a lot of bone pain, from his surgery   But it is improving.        Past Medical History  Diagnosis Date  . Hypertension     essential, benign  . Anxiety and depression   . Chest pain     hx  . HLD (hyperlipidemia)   . Palpitation     hx  . Other chest pain     tightness, pressure  . Precordial pain   . Murmur   . Bruit     L  .  Impotence of organic origin   . Edema   . Goiter   . Anxiety   . Sleep apnea     uses cpap  . Diabetes mellitus without complication     "borderline"  . Hypothyroidism   . GERD (gastroesophageal reflux disease)     uses Omeprazole  . Fatty liver     Past Surgical History  Procedure Laterality Date  . Knee arthroscopy    . Appendectomy  2002    done at [facility]  . Total knee arthroplasty      right  . Hernia repair  [id number]    umbilical  . Joint replacement Right     right knee  . Tonsillectomy    . Colonoscopy    . Cardiac catheterization    . Revision total knee arthroplasty Right 06/23/2014    DR Marlou Sa  . Total knee revision Right 06/23/2014    Procedure: TOTAL KNEE REVISION;  Surgeon: Meredith Pel, MD;  Location: [place];  Service: Orthopedics;  Laterality: Right;       The following portions of the patient's history were reviewed and updated as appropriate: Allergies, current medications, and problem list.    Review of Systems:   Patient denies headache, fevers, malaise, unintentional weight loss, skin  rash, eye pain, sinus congestion and sinus pain, sore throat, dysphagia,  hemoptysis , cough, dyspnea, wheezing, chest pain, palpitations, orthopnea, edema, abdominal pain, nausea, melena, diarrhea, constipation, flank pain, dysuria, hematuria, urinary  Frequency, nocturia, numbness, tingling, seizures,  Focal weakness, Loss of consciousness,  Tremor, insomnia, depression, anxiety, and suicidal ideation.     History   Social History  . Marital Status: Married    Spouse Name: N/A  . Number of Children: 2  . Years of Education: N/A   Occupational History  . Maintenance supervisor   . SERVICE DIRECTOR    Social History Main Topics  . Smoking status: Former Smoker -- 2.00 packs/day for 18 years    Types: Cigarettes    Quit date: 04/24/2005  . Smokeless tobacco: Never Used     Comment: quit 20 years ago   . Alcohol Use: 16.0 oz/week    12 Cans of beer, 3  Shots of liquor, 14 Standard drinks or equivalent per week     Comment: 1 a day  . Drug Use: No  . Sexual Activity: Yes   Other Topics Concern  . Not on file   Social History Narrative   Married, gets regular exercise.     Objective:  Filed Vitals:   08/04/14 1557  BP: 138/72  Pulse: 79  Temp: 98.2 F (36.8 C)  Resp: 12     General appearance: alert, cooperative and appears stated age Ears: normal TM's and external ear canals both ears Throat: lips, mucosa, and tongue normal; teeth and gums normal Neck: no adenopathy, no carotid bruit, supple, symmetrical, trachea midline and thyroid not enlarged, symmetric, no tenderness/mass/nodules Back: symmetric, no curvature. ROM normal. No CVA tenderness. Lungs: clear to auscultation bilaterally Heart: regular rate and rhythm, S1, S2 normal, no murmur, click, rub or gallop Abdomen: soft, non-tender; bowel sounds normal; no masses,  no organomegaly Pulses: 2+ and symmetric Skin: Skin color, texture, turgor normal. No rashes or lesions Lymph nodes: Cervical, supraclavicular, and axillary nodes normal.  Assessment and Plan:  Essential hypertension Elevated today.  Reviewed list of meds, patient is not taking OTC meds that could be causing,. It.  Have asked patient to recheck bp at home a minimum of 5 times over the next 4 weeks and call readings to office for adjustment of medications.   Lab Results  Component Value Date   CREATININE 1.16 08/04/2014   Lab Results  Component Value Date   NA 136 08/04/2014   K 4.3 08/04/2014   CL 101 08/04/2014   CO2 26 08/04/2014       Fatty liver  coonfirmed with liver biopsy done at [facility] Nov 2014.  He  is following a lower glycemic index, has improved glycrmic control and lost  Weight.  Liver enzymes are now normal.     Lab Results  Component Value Date   ALT 15 08/04/2014   AST 20 08/04/2014   ALKPHOS 114 08/04/2014   BILITOT 0.3 08/04/2014             Diabetes  mellitus type 2 in obese Currently well-controlled on current medications .  hemoglobin A1c is at goal of less than 7.0 . Patient is reminded to schedule an annual eye exam and foot exam is normal today. Patient has no microalbuminuria. Patient is tolerating statin therapy for CAD risk reduction and on ACE/ARB for renal protection and hypertension.  Lab Results  Component Value Date   HGBA1C 5.3 08/04/2014   Lab Results  Component Value  Date   MICROALBUR 1.5 08/04/2014   Lab Results  Component Value Date   ALT 15 08/04/2014   AST 20 08/04/2014   ALKPHOS 114 08/04/2014   BILITOT 0.3 08/04/2014       Obesity I have congratulated him in reduction of   BMI and encouraged  Continued weight loss with goal of 10% of body weigh over the next 6 months using a low glycemic index diet and regular exercise a minimum of 5 days per week.     Hyperlipidemia associated with type 2 diabetes mellitus triglycerides have improved but are still > 300., with low HDL and LDL > 100.  Recommending trial of crestor. .  . Lab Results  Component Value Date   CHOL 224* 08/04/2014   HDL 36.50* 08/04/2014   LDLCALC 129* 10/27/2013   LDLDIRECT 155.0 08/04/2014   TRIG 305.0* 08/04/2014   CHOLHDL 6 08/04/2014        Updated Medication List Outpatient Encounter Prescriptions as of 08/04/2014  Medication Sig  . ALPRAZolam (XANAX) 0.5 MG tablet TAKE 1 TABLET BY MOUTH AT BEDTIME AS NEEDED (Patient taking differently: TAKE 1 TABLET BY MOUTH AT BEDTIME AS NEEDED FOR ANXIETY and in AM if needed)  . BAYER MICROLET LANCETS lancets Use as instructed  . buPROPion (WELLBUTRIN SR) 150 MG 12 hr tablet Take 1 tablet (150 mg total) by mouth 2 (two) times daily.  . citalopram (CELEXA) 20 MG tablet TAKE 1 TABLET BY MOUTH DAILY.  . clidinium-chlordiazePOXIDE (LIBRAX) 5-2.5 MG per capsule Take 1 capsule by mouth 3 (three) times daily before meals.  Marland Kitchen HYDROcodone-acetaminophen (NORCO) 7.5-325 MG per tablet Take 1 tablet  by mouth every 6 (six) hours as needed for moderate pain.  Marland Kitchen HYDROcodone-acetaminophen (NORCO) 7.5-325 MG per tablet Take 1 tablet by mouth every 6 (six) hours as needed for moderate pain.  Marland Kitchen levothyroxine (SYNTHROID, LEVOTHROID) 88 MCG tablet Take 1 tablet (88 mcg total) by mouth daily before breakfast.  . lisinopril-hydrochlorothiazide (PRINZIDE,ZESTORETIC) 20-25 MG per tablet Take 1 tablet by mouth daily.  . methocarbamol (ROBAXIN) 500 MG tablet Take 1 tablet (500 mg total) by mouth every 8 (eight) hours as needed for muscle spasms.  . metoprolol tartrate (LOPRESSOR) 25 MG tablet Take 1 tablet (25 mg total) by mouth 2 (two) times daily.  . Multiple Vitamin (MULTIVITAMIN) tablet Take 1 tablet by mouth daily.    Marland Kitchen omeprazole (PRILOSEC) 40 MG capsule TAKE ONE CAPSULE BY MOUTH TWICE A DAY  . [DISCONTINUED] HYDROcodone-acetaminophen (NORCO) 7.5-325 MG per tablet Take 1 tablet by mouth every 6 (six) hours as needed for moderate pain.  . [DISCONTINUED] HYDROcodone-acetaminophen (NORCO) 7.5-325 MG per tablet Take 1 tablet by mouth every 6 (six) hours as needed for moderate pain.  . [DISCONTINUED] oxyCODONE (OXY IR/ROXICODONE) 5 MG immediate release tablet Take 1-2 tablets (5-10 mg total) by mouth every 3 (three) hours as needed for breakthrough pain. (Patient not taking: Reported on 08/04/2014)  . [DISCONTINUED] OxyCODONE (OXYCONTIN) 10 mg T12A 12 hr tablet Take 1 tablet (10 mg total) by mouth every 12 (twelve) hours. (Patient not taking: Reported on 08/04/2014)  . [DISCONTINUED] warfarin (COUMADIN) 5 MG tablet Take 1 tablet (5 mg total) by mouth daily. (Patient not taking: Reported on 08/04/2014)     Orders Placed This Encounter  Procedures  . CBC with Differential/Platelet  . Lipid panel  . Hemoglobin A1c  . Comprehensive metabolic panel  . LDL cholesterol, direct    No Follow-up on file.

## 2014-08-04 NOTE — Patient Instructions (Signed)
CONGRATULATIONS!!!!    i'll see you again in 3  Months   Don't forget o=to have an annual eye exam   If your kidney function is normal,  i will send in an anti inflammatory

## 2014-08-05 LAB — COMPREHENSIVE METABOLIC PANEL
ALT: 15 U/L (ref 0–53)
AST: 20 U/L (ref 0–37)
Albumin: 4.1 g/dL (ref 3.5–5.2)
Alkaline Phosphatase: 114 U/L (ref 39–117)
BILIRUBIN TOTAL: 0.3 mg/dL (ref 0.2–1.2)
BUN: 20 mg/dL (ref 6–23)
CALCIUM: 9.7 mg/dL (ref 8.4–10.5)
CHLORIDE: 101 meq/L (ref 96–112)
CO2: 26 meq/L (ref 19–32)
Creatinine, Ser: 1.16 mg/dL (ref 0.40–1.50)
GFR: 70.91 mL/min (ref >60.00)
Glucose, Bld: 66 mg/dL — ABNORMAL LOW (ref 70–99)
Potassium: 4.3 mEq/L (ref 3.5–5.1)
Sodium: 136 mEq/L (ref 135–145)
Total Protein: 7.1 g/dL (ref 6.0–8.3)

## 2014-08-05 LAB — CBC WITH DIFFERENTIAL/PLATELET
BASOS ABS: 0.1 10*3/uL (ref 0.0–0.1)
BASOS PCT: 1.4 % (ref 0.0–3.0)
EOS ABS: 0.2 10*3/uL (ref 0.0–0.7)
Eosinophils Relative: 4.4 % (ref 0.0–5.0)
HEMATOCRIT: 36.5 % — AB (ref 39.0–52.0)
HEMOGLOBIN: 12.5 g/dL — AB (ref 13.0–17.0)
LYMPHS ABS: 0.8 10*3/uL (ref 0.7–4.0)
Lymphocytes Relative: 15.7 % (ref 12.0–46.0)
MCHC: 34.3 g/dL (ref 30.0–36.0)
MCV: 89.8 fl (ref 78.0–100.0)
MONO ABS: 0.4 10*3/uL (ref 0.1–1.0)
Monocytes Relative: 8.6 % (ref 3.0–12.0)
NEUTROS ABS: 3.5 10*3/uL (ref 1.4–7.7)
Neutrophils Relative %: 69.9 % (ref 43.0–77.0)
Platelets: 335 10*3/uL (ref 150.0–400.0)
RBC: 4.07 Mil/uL — ABNORMAL LOW (ref 4.22–5.81)
RDW: 14.3 % (ref 11.5–15.5)
WBC: 5 10*3/uL (ref 4.0–10.5)

## 2014-08-05 LAB — LIPID PANEL
Cholesterol: 224 mg/dL — ABNORMAL HIGH (ref 0–200)
HDL: 36.5 mg/dL — ABNORMAL LOW (ref >39.00)
NonHDL: 187.5
TRIGLYCERIDES: 305 mg/dL — AB (ref 0.0–149.0)
Total CHOL/HDL Ratio: 6
VLDL: 61 mg/dL — AB (ref 0.0–40.0)

## 2014-08-05 LAB — MICROALBUMIN / CREATININE URINE RATIO
Creatinine,U: 74.7 mg/dL
Microalb Creat Ratio: 2 mg/g (ref 0.0–30.0)
Microalb, Ur: 1.5 mg/dL (ref 0.0–1.9)

## 2014-08-05 LAB — HEMOGLOBIN A1C: HEMOGLOBIN A1C: 5.3 % (ref 4.6–6.5)

## 2014-08-05 LAB — LDL CHOLESTEROL, DIRECT: Direct LDL: 155 mg/dL

## 2014-08-06 ENCOUNTER — Encounter: Payer: Self-pay | Admitting: Internal Medicine

## 2014-08-06 NOTE — Assessment & Plan Note (Addendum)
coonfirmed with liver biopsy done at [facility] Nov 2014.  He  is following a lower glycemic index, has improved glycrmic control and lost  Weight.  Liver enzymes are now normal.     Lab Results  Component Value Date   ALT 15 08/04/2014   AST 20 08/04/2014   ALKPHOS 114 08/04/2014   BILITOT 0.3 08/04/2014

## 2014-08-06 NOTE — Assessment & Plan Note (Signed)
Currently well-controlled on current medications .  hemoglobin A1c is at goal of less than 7.0 . Patient is reminded to schedule an annual eye exam and foot exam is normal today. Patient has no microalbuminuria. Patient is tolerating statin therapy for CAD risk reduction and on ACE/ARB for renal protection and hypertension.  Lab Results  Component Value Date   HGBA1C 5.3 08/04/2014   Lab Results  Component Value Date   MICROALBUR 1.5 08/04/2014   Lab Results  Component Value Date   ALT 15 08/04/2014   AST 20 08/04/2014   ALKPHOS 114 08/04/2014   BILITOT 0.3 08/04/2014

## 2014-08-06 NOTE — Assessment & Plan Note (Signed)
I have congratulated him in reduction of   BMI and encouraged  Continued weight loss with goal of 10% of body weigh over the next 6 months using a low glycemic index diet and regular exercise a minimum of 5 days per week.   

## 2014-08-06 NOTE — Assessment & Plan Note (Addendum)
Elevated today.  Reviewed list of meds, patient is not taking OTC meds that could be causing,. It.  Have asked patient to recheck bp at home a minimum of 5 times over the next 4 weeks and call readings to office for adjustment of medications.   Lab Results  Component Value Date   CREATININE 1.16 08/04/2014   Lab Results  Component Value Date   NA 136 08/04/2014   K 4.3 08/04/2014   CL 101 08/04/2014   CO2 26 08/04/2014

## 2014-08-06 NOTE — Assessment & Plan Note (Signed)
triglycerides have improved but are still > 300., with low HDL and LDL > 100.  Recommending trial of crestor. .  . Lab Results  Component Value Date   CHOL 224* 08/04/2014   HDL 36.50* 08/04/2014   LDLCALC 129* 10/27/2013   LDLDIRECT 155.0 08/04/2014   TRIG 305.0* 08/04/2014   CHOLHDL 6 08/04/2014

## 2014-08-07 ENCOUNTER — Other Ambulatory Visit: Payer: Self-pay | Admitting: Internal Medicine

## 2014-08-07 ENCOUNTER — Encounter: Payer: Self-pay | Admitting: Internal Medicine

## 2014-08-07 MED ORDER — ROSUVASTATIN CALCIUM 20 MG PO TABS: 20.0000 mg | ORAL_TABLET | Freq: Every day | ORAL | Status: DC

## 2014-08-15 NOTE — Op Note (Signed)
PATIENT NAME:  [name], [name] MR#:  [id number] DATE OF BIRTH:  1965-06-25  DATE OF PROCEDURE:  05/15/2013  PREOPERATIVE DIAGNOSIS:  Symptomatic umbilical hernia.   POSTOPERATIVE DIAGNOSIS:  Symptomatic umbilical hernia.   OPERATIVE PROCEDURE:  Umbilical hernia repair.   OPERATING SURGEON:  Robert Bellow, MD  ANESTHESIA:  General by LMA under Dr. Boston Service, Marcaine 0.5% plain 30 mL local infiltration, Toradol 30 mg.   ESTIMATED BLOOD LOSS:  Minimal.   CLINICAL NOTE:  This 49 year old male has developed a symptomatic umbilical hernia. He was admitted for elective repair. He received Kefzol prior to the procedure.   OPERATIVE NOTE:  With the patient under adequate general anesthesia and hair previously removed with clippers, the abdomen was prepped with ChloraPrep and draped. An infraumbilical incision was made and carried down through the skin and subcutaneous tissue after infiltration of local anesthesia for postoperative comfort. The umbilical skin was elevated off the underlying hernia sac, and the sac transected at the level of the fascia. The adipose tissue was returned to the abdominal cavity. A simple repair with interrupted 0 Surgilon sutures was completed. The umbilical skin was tacked to the fascia with 3-0 Vicryl. Toradol was placed in the wound for postoperative analgesia. The adipose layer was closed with running 3-0 Vicryl, and the skin closed with a running 4-0 Vicryl subcuticular suture. Benzoin, Steri-Strips, Telfa, and Tegaderm dressing was then applied.   The patient tolerated the procedure well and was taken to the recovery room in stable condition.    ____________________________ Robert Bellow, MD jwb:ms D: 05/15/2013 20:14:13 ET T: 05/15/2013 21:22:13 ET JOB#: [id number]  cc: Robert Bellow, MD, <Dictator> Deborra Medina, MD Noami Bove Amedeo Kinsman MD ELECTRONICALLY SIGNED 05/17/2013 5:56

## 2014-08-16 NOTE — H&P (Signed)
PATIENT NAME:  [name], [name] MR#:  [id number] DATE OF BIRTH:  1965-09-13  DATE OF ADMISSION:  06/07/2011  PRIMARY CARE PHYSICIAN: Dr. Deborra Medina  CHIEF COMPLAINT: Diarrhea, nausea, vomiting.   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 He has had contact with patient's there that have had the Norovirus. Yesterday started feeling bad with body aches, this morning he started developing fever, diarrhea, nausea, and vomiting.   PAST MEDICAL HISTORY: Hypertension.   PAST SURGICAL HISTORY: Appendectomy.   ALLERGIES: Vicodin.   CURRENT MEDICATIONS:  1. Wellbutrin SR 150 mg daily (for smoking cessation). 2. Hydrochlorothiazide 25 mg daily.  3. Aspirin 81 mg daily.   SOCIAL HISTORY: Does not smoke. Drinks about 2 to 3 drinks a week of alcohol.   FAMILY HISTORY: Significant for coronary artery disease.    REVIEW OF SYSTEMS: CONSTITUTIONAL: He has had fevers. EYES: No blurred vision. ENT: No hearing loss. CARDIOVASCULAR: No chest pain. PULMONARY: No shortness of breath. GASTROINTESTINAL: He has had nausea, vomiting and diarrhea. GENITOURINARY: No dysuria. ENDOCRINE: No heat or cold intolerance. INTEGUMENT: No rash. MUSCULOSKELETAL: Occasional joint pain. NEUROLOGIC: No numbness or weakness.   PHYSICAL EXAMINATION:  VITAL SIGNS: Temperature 101.2, pulse 108, respirations 20, blood pressure 116/69.   GENERAL: This is a well-nourished white male who appears ill.   HEENT: The pupils are equal, round, reactive to light. Sclerae is not icteric. Oral mucosa is moist. Oropharynx is clear. Nasopharynx is clear.   NECK: Supple. No JVD, lymphadenopathy or thyromegaly.   CARDIOVASCULAR: Regular rate and rhythm. No murmurs, rubs, or gallops.   LUNGS: Clear to auscultation. No dullness to percussion.   ABDOMEN: Soft, nontender, nondistended. Bowel sounds are positive. No hepatosplenomegaly. No masses.   EXTREMITIES: There is no edema. No joint  deformity. There is full range of motion in all extremities.   NEUROLOGIC: Cranial nerves II through XII are intact. He is alert and oriented x4.   SKIN: Moist with no rash.   ASSESSMENT AND PLAN:  1. Viral gastroenteritis. I suspect this is Norovirus, will check for that. Will give him supportive care with IV fluids, antiemetics.  2. Diarrhea. Again, will give him supportive care.  3. Dehydration. Give him IV fluids aggressively.  4. Hypertension. Continue his hydrochlorothiazide.   TIME SPENT ON ADMISSION: 45 minutes.   ____________________________ Baxter Hire, MD jdj:cms D: 06/07/2011 17:41:45 ET T: 06/08/2011 06:37:03 ET JOB#: [id number] cc: Baxter Hire, MD, <Dictator> Deborra Medina, MD Baxter Hire MD ELECTRONICALLY SIGNED 06/17/2011 17:32

## 2014-08-16 NOTE — Discharge Summary (Signed)
PATIENT NAME:  [name], [name] MR#:  [id number] DATE OF BIRTH:  1965-10-07  DATE OF ADMISSION:  06/07/2011 DATE OF DISCHARGE:  06/10/2011  CHIEF COMPLAINT:  Nausea, vomiting, and diarrhea.  DISCHARGE DIAGNOSES:  1. Viral gastroenteritis.  2. Dehydration.  3. Mild acute renal failure.  4. Hypertension.  5. Elevated liver function tests.   DISCHARGE MEDICATIONS:  1. Aspirin 81 mg daily.  2. Hydrochlorothiazide/lisinopril 25/20 mg daily.  3. Wellbutrin SR 150 mg daily.  4. Zofran 4 mg p.o. every six hours as needed for nausea.   DISPOSITION: Home.   DIET: Low sodium, ADA diet.   ACTIVITY: As tolerated.   FOLLOWUP: Please follow up with your PCP in 1 to 2 weeks.   CHIEF COMPLAINT: The patient is a 49 year old Caucasian male with hypertension who had contact with a patient with Norovirus and presented with nausea, vomiting, and diarrhea. For full details, please see the History and Physical dictated on 06/07/2011 by Dr. Edwina Barth.   HOSPITAL COURSE: The patient was started on IV fluids. The patient was also started on p.r.n. Zofran and initially blood pressure medications were held. She had mild acute renal insufficiency with elevation of BUN to 19, and creatinine being 1.28. The patient also had mild elevation of LFTs. The patient's diet was slowly increased. IV fluids were continued and stool samples were sent. They have been negative for Clostridium difficile. No WBCs were found either. At this point the patient has been tolerating GI soft and will be discharged with outpatient followup with his PCP as his symptoms have resolved. The likely etiology is viral gastroenteritis.   CODE STATUS: The patient is a FULL CODE.   TOTAL TIME SPENT: 35 minutes.    ____________________________ Vivien Presto, MD sa:bjt D: 06/10/2011 13:30:35 ET T: 06/10/2011 16:12:06 ET JOB#: [id number]  cc: Vivien Presto, MD, <Dictator> Vivien Presto MD ELECTRONICALLY SIGNED 06/16/2011 15:26

## 2014-08-17 ENCOUNTER — Emergency Department
Admit: 2014-08-17 | Disposition: A | Discharge status: Home / Self Care | Payer: Self-pay | Admitting: Emergency Medicine

## 2014-08-17 LAB — CBC WITH DIFFERENTIAL/PLATELET
Basophil #: 0 10*3/uL (ref 0.0–0.1)
Basophil %: 0.4 %
EOS ABS: 0.1 10*3/uL (ref 0.0–0.7)
Eosinophil %: 1.8 %
HCT: 40.5 % (ref 40.0–52.0)
HGB: 13.5 g/dL (ref 13.0–18.0)
Lymphocyte #: 0.5 10*3/uL — ABNORMAL LOW (ref 1.0–3.6)
Lymphocyte %: 8.7 %
MCH: 30.7 pg (ref 26.0–34.0)
MCHC: 33.4 g/dL (ref 32.0–36.0)
MCV: 92 fL (ref 80–100)
Monocyte #: 0.5 x10 3/mm (ref 0.2–1.0)
Monocyte %: 9 %
NEUTROS ABS: 4.9 10*3/uL (ref 1.4–6.5)
Neutrophil %: 80.1 %
PLATELETS: 305 10*3/uL (ref 150–440)
RBC: 4.41 10*6/uL (ref 4.40–5.90)
RDW: 14.5 % (ref 11.5–14.5)
WBC: 6.1 10*3/uL (ref 3.8–10.6)

## 2014-08-17 LAB — URINALYSIS, COMPLETE
Bacteria: NONE SEEN
Bilirubin,UR: NEGATIVE
Blood: NEGATIVE
GLUCOSE, UR: NEGATIVE mg/dL (ref 0–75)
Ketone: NEGATIVE
LEUKOCYTE ESTERASE: NEGATIVE
Nitrite: NEGATIVE
Ph: 6 (ref 4.5–8.0)
Protein: NEGATIVE
SPECIFIC GRAVITY: 1.004 (ref 1.003–1.030)
Squamous Epithelial: NONE SEEN
WBC UR: NONE SEEN /HPF (ref 0–5)

## 2014-08-17 LAB — COMPREHENSIVE METABOLIC PANEL
ALK PHOS: 109 U/L
ANION GAP: 10 (ref 7–16)
Albumin: 4.4 g/dL
BILIRUBIN TOTAL: 0.4 mg/dL
BUN: 20 mg/dL
CHLORIDE: 101 mmol/L
CO2: 27 mmol/L
CREATININE: 1.18 mg/dL
Calcium, Total: 8.8 mg/dL — ABNORMAL LOW
EGFR (African American): >60
Glucose: 99 mg/dL
POTASSIUM: 2.9 mmol/L — AB
SGOT(AST): 25 U/L
SGPT (ALT): 17 U/L
Sodium: 138 mmol/L
Total Protein: 7.7 g/dL

## 2014-08-17 LAB — LIPASE, BLOOD: Lipase: 30 U/L

## 2014-08-17 LAB — CLOSTRIDIUM DIFFICILE(ARMC)

## 2014-08-18 ENCOUNTER — Other Ambulatory Visit: Payer: Self-pay | Admitting: Internal Medicine

## 2014-09-03 ENCOUNTER — Encounter: Payer: Self-pay | Admitting: Internal Medicine

## 2014-09-23 ENCOUNTER — Telehealth: Payer: Self-pay | Admitting: Internal Medicine

## 2014-09-23 NOTE — Telephone Encounter (Signed)
FYI, Patient is on the schedule tomorrow with Dr. Gilford Rile per [facility] note.

## 2014-09-23 NOTE — Telephone Encounter (Signed)
[facility] Patient Name: RYLEE NUZUM DOB: [birth year] Initial Comment Caller states he removed a Tick on Sunday. Now he has a red spot at the site, with pink. Sore to the touch, and itching. Nurse Assessment Nurse: Markus Daft, RN, Sherre Poot Date/Time (Eastern Time): 09/23/2014 12:30:19 PM Confirm and document reason for call. If symptomatic, describe symptoms. ---Caller states he removed a Tick by his front right waistline on Sunday. Noticed itching yesterday, and now he has a red spot at the site with pink. Sore to the touch. No fever/cold-hot chills. Has the patient traveled out of the country within the last 30 days? ---Not Applicable Does the patient require triage? ---Yes Related visit to physician within the last 2 weeks? ---No Does the PT have any chronic conditions? (i.e. diabetes, asthma, etc.) ---Yes List chronic conditions. ---HTN Guidelines Guideline Title Affirmed Question Affirmed Notes Tick Bite [1] 2 to 14 days following tick bite AND [2] widespread rash or headache AND [3] no fever headache today Final Disposition User See Physician within [address] Hours [place], [place], [place] Comments Appt made with Dr. Ronette Deter tomorrow, 09/24/14 at 7 am.

## 2014-09-24 ENCOUNTER — Ambulatory Visit (INDEPENDENT_AMBULATORY_CARE_PROVIDER_SITE_OTHER): Payer: BLUE CROSS/BLUE SHIELD | Admitting: Internal Medicine

## 2014-09-24 ENCOUNTER — Encounter: Payer: Self-pay | Admitting: Internal Medicine

## 2014-09-24 VITALS — BP 118/70 | HR 70 | Temp 98.2°F | Ht 72.0 in | Wt 212.5 lb

## 2014-09-24 DIAGNOSIS — W57XXXA Bitten or stung by nonvenomous insect and other nonvenomous arthropods, initial encounter: Secondary | ICD-10-CM | POA: Diagnosis not present

## 2014-09-24 DIAGNOSIS — T148 Other injury of unspecified body region: Secondary | ICD-10-CM | POA: Diagnosis not present

## 2014-09-24 MED ORDER — DOXYCYCLINE HYCLATE 100 MG PO TABS
100.0000 mg | ORAL_TABLET | Freq: Two times a day (BID) | ORAL | Status: DC
Start: 2014-09-24 — End: 2014-11-03

## 2014-09-24 MED ORDER — GENTAMICIN SULFATE 0.1 % EX OINT: 1.0000 "application " | TOPICAL_OINTMENT | Freq: Three times a day (TID) | CUTANEOUS | Status: DC

## 2014-09-24 NOTE — Patient Instructions (Signed)
Start Doxycycline 100mg  twice daily. Start topical Gentamicin, pea sized amount twice daily.   Tick Bite Information Ticks are insects that attach themselves to the skin and draw blood for food. There are various types of ticks. Common types include wood ticks and deer ticks. Most ticks live in shrubs and grassy areas. Ticks can climb onto your body when you make contact with leaves or grass where the tick is waiting. The most common places on the body for ticks to attach themselves are the scalp, neck, armpits, waist, and groin. Most tick bites are harmless, but sometimes ticks carry germs that cause diseases. These germs can be spread to a person during the tick's feeding process. The chance of a disease spreading through a tick bite depends on:   The type of tick.  Time of year.   How long the tick is attached.   Geographic location.  HOW CAN YOU PREVENT TICK BITES? Take these steps to help prevent tick bites when you are outdoors:  Wear protective clothing. Long sleeves and long pants are best.   Wear white clothes so you can see ticks more easily.  Tuck your pant legs into your socks.   If walking on a trail, stay in the middle of the trail to avoid brushing against bushes.  Avoid walking through areas with long grass.  Put insect repellent on all exposed skin and along boot tops, pant legs, and sleeve cuffs.   Check clothing, hair, and skin repeatedly and before going inside.   Brush off any ticks that are not attached.  Take a shower or bath as soon as possible after being outdoors.  WHAT IS THE PROPER WAY TO REMOVE A TICK? Ticks should be removed as soon as possible to help prevent diseases caused by tick bites. 1. If latex gloves are available, put them on before trying to remove a tick.  2. Using fine-point tweezers, grasp the tick as close to the skin as possible. You may also use curved forceps or a tick removal tool. Grasp the tick as close to its head as  possible. Avoid grasping the tick on its body. 3. Pull gently with steady upward pressure until the tick lets go. Do not twist the tick or jerk it suddenly. This may break off the tick's head or mouth parts. 4. Do not squeeze or crush the tick's body. This could force disease-carrying fluids from the tick into your body.  5. After the tick is removed, wash the bite area and your hands with soap and water or other disinfectant such as alcohol. 6. Apply a small amount of antiseptic cream or ointment to the bite site.  7. Wash and disinfect any instruments that were used.  Do not try to remove a tick by applying a hot match, petroleum jelly, or fingernail polish to the tick. These methods do not work and may increase the chances of disease being spread from the tick bite.  WHEN SHOULD YOU SEEK MEDICAL CARE? Contact your health care provider if you are unable to remove a tick from your skin or if a part of the tick breaks off and is stuck in the skin.  After a tick bite, you need to be aware of signs and symptoms that could be related to diseases spread by ticks. Contact your health care provider if you develop any of the following in the days or weeks after the tick bite:  Unexplained fever.  Rash. A circular rash that appears days or weeks  after the tick bite may indicate the possibility of Lyme disease. The rash may resemble a target with a bull's-eye and may occur at a different part of your body than the tick bite.  Redness and swelling in the area of the tick bite.   Tender, swollen lymph glands.   Diarrhea.   Weight loss.   Cough.   Fatigue.   Muscle, joint, or bone pain.   Abdominal pain.   Headache.   Lethargy or a change in your level of consciousness.  Difficulty walking or moving your legs.   Numbness in the legs.   Paralysis.  Shortness of breath.   Confusion.   Repeated vomiting.  Document Released: 04/07/2000 Document Revised: 01/29/2013  Document Reviewed: 09/18/2012 [facility] Patient Information 2015 [place], [place]. This information is not intended to replace advice given to you by your health care provider. Make sure you discuss any questions you have with your health care provider.

## 2014-09-24 NOTE — Progress Notes (Signed)
Pre visit review using our clinic review tool, if applicable. No additional management support is needed unless otherwise documented below in the visit note. 

## 2014-09-24 NOTE — Assessment & Plan Note (Signed)
Tick bite with headache. Will start Doxycycline to cover RMSF. Start Gentamicin to site. Follow up prn if recurrent headache, fever, chills, or other concerns.

## 2014-09-24 NOTE — Progress Notes (Signed)
   Subjective:    Patient ID: [name], male    DOB: 1966/01/15, 49 y.o.   MRN: [id number]  HPI  49YO male presents for acute visit.  Tick bite on Sunday, lower abdomen. Started having HA yesterday. No rash. No fever. Took Ibuprofen for headache yesterday with resolution. Notes some redness at site.  Past medical, surgical, family and social history per today's encounter.  Review of Systems  Constitutional: Negative for fever, chills, activity change, appetite change, fatigue and unexpected weight change.  Eyes: Negative for visual disturbance.  Respiratory: Negative for cough and shortness of breath.   Cardiovascular: Negative for chest pain, palpitations and leg swelling.  Gastrointestinal: Negative for abdominal pain and abdominal distention.  Genitourinary: Negative for dysuria, urgency and difficulty urinating.  Musculoskeletal: Negative for arthralgias and gait problem.  Skin: Positive for color change. Negative for rash.  Neurological: Positive for headaches.  Hematological: Negative for adenopathy.  Psychiatric/Behavioral: Negative for sleep disturbance and dysphoric mood. The patient is not nervous/anxious.        Objective:    BP 118/70 mmHg  Pulse 70  Temp(Src) 98.2 F (36.8 C) (Oral)  Ht 6' (1.829 m)  Wt 212 lb 8 oz (96.389 kg)  BMI 28.81 kg/m2  SpO2 97% Physical Exam  Constitutional: He is oriented to person, place, and time. He appears well-developed and well-nourished. No distress.  HENT:  Head: Normocephalic and atraumatic.  Right Ear: External ear normal.  Left Ear: External ear normal.  Nose: Nose normal.  Mouth/Throat: Oropharynx is clear and moist.  Eyes: Conjunctivae and EOM are normal. Pupils are equal, round, and reactive to light. Right eye exhibits no discharge. Left eye exhibits no discharge. No scleral icterus.  Neck: Normal range of motion. Neck supple. No tracheal deviation present. No thyromegaly present.  Cardiovascular: Normal rate,  regular rhythm and normal heart sounds.  Exam reveals no gallop and no friction rub.   No murmur heard. Pulmonary/Chest: Effort normal. No accessory muscle usage. No tachypnea. He has no decreased breath sounds. He has no rhonchi.  Musculoskeletal: Normal range of motion. He exhibits no edema.  Lymphadenopathy:    He has no cervical adenopathy.  Neurological: He is alert and oriented to person, place, and time. No cranial nerve deficit. Coordination normal.  Skin: Skin is warm and dry. No rash noted. He is not diaphoretic. No erythema. No pallor.     Psychiatric: He has a normal mood and affect. His behavior is normal. Judgment and thought content normal.          Assessment & Plan:   Problem List Items Addressed This Visit      Unprioritized   Tick bite - Primary    Tick bite with headache. Will start Doxycycline to cover RMSF. Start Gentamicin to site. Follow up prn if recurrent headache, fever, chills, or other concerns.      Relevant Medications   doxycycline (VIBRA-TABS) 100 MG tablet   gentamicin ointment (GARAMYCIN) 0.1 %       Return if symptoms worsen or fail to improve.

## 2014-10-18 ENCOUNTER — Other Ambulatory Visit: Payer: Self-pay | Admitting: Internal Medicine

## 2014-10-18 ENCOUNTER — Other Ambulatory Visit: Payer: Self-pay | Admitting: Physician Assistant

## 2014-10-20 ENCOUNTER — Other Ambulatory Visit: Payer: Self-pay | Admitting: Internal Medicine

## 2014-10-20 ENCOUNTER — Other Ambulatory Visit: Payer: Self-pay | Admitting: *Deleted

## 2014-10-20 DIAGNOSIS — F411 Generalized anxiety disorder: Secondary | ICD-10-CM

## 2014-10-20 MED ORDER — ALPRAZOLAM 0.5 MG PO TABS: ORAL_TABLET | ORAL | Status: DC

## 2014-10-20 NOTE — Telephone Encounter (Signed)
Rx phoned into pharmacy, see other enconuter

## 2014-10-20 NOTE — Telephone Encounter (Signed)
Has follow up appt scheduled 11/03/14, refill?

## 2014-10-20 NOTE — Progress Notes (Signed)
Rx phoned into pharmacy.

## 2014-11-03 ENCOUNTER — Encounter: Payer: Self-pay | Admitting: Internal Medicine

## 2014-11-03 ENCOUNTER — Ambulatory Visit (INDEPENDENT_AMBULATORY_CARE_PROVIDER_SITE_OTHER): Payer: BLUE CROSS/BLUE SHIELD | Admitting: Internal Medicine

## 2014-11-03 VITALS — BP 130/70 | HR 63 | Temp 98.3°F | Resp 14 | Ht 73.0 in | Wt 219.8 lb

## 2014-11-03 DIAGNOSIS — E119 Type 2 diabetes mellitus without complications: Secondary | ICD-10-CM | POA: Diagnosis not present

## 2014-11-03 DIAGNOSIS — E663 Overweight: Secondary | ICD-10-CM

## 2014-11-03 DIAGNOSIS — E669 Obesity, unspecified: Secondary | ICD-10-CM | POA: Diagnosis not present

## 2014-11-03 DIAGNOSIS — E785 Hyperlipidemia, unspecified: Secondary | ICD-10-CM

## 2014-11-03 DIAGNOSIS — K760 Fatty (change of) liver, not elsewhere classified: Secondary | ICD-10-CM | POA: Diagnosis not present

## 2014-11-03 DIAGNOSIS — I10 Essential (primary) hypertension: Secondary | ICD-10-CM

## 2014-11-03 DIAGNOSIS — M25561 Pain in right knee: Secondary | ICD-10-CM | POA: Diagnosis not present

## 2014-11-03 DIAGNOSIS — G8929 Other chronic pain: Secondary | ICD-10-CM

## 2014-11-03 DIAGNOSIS — E1169 Type 2 diabetes mellitus with other specified complication: Secondary | ICD-10-CM | POA: Diagnosis not present

## 2014-11-03 MED ORDER — HYDROCODONE-ACETAMINOPHEN 10-325 MG PO TABS: 1.0000 | ORAL_TABLET | Freq: Four times a day (QID) | ORAL | Status: DC | PRN

## 2014-11-03 MED ORDER — HYDROCODONE-ACETAMINOPHEN 10-325 MG PO TABS: 1.0000 | ORAL_TABLET | Freq: Four times a day (QID) | ORAL | Status: AC | PRN

## 2014-11-03 MED ORDER — ROSUVASTATIN CALCIUM 20 MG PO TABS
20.0000 mg | ORAL_TABLET | Freq: Every day | ORAL | Status: DC
Start: 2014-11-03 — End: 2015-04-06

## 2014-11-03 NOTE — Progress Notes (Signed)
Pre visit review using our clinic review tool, if applicable. No additional management support is needed unless otherwise documented below in the visit note. 

## 2014-11-04 LAB — COMPREHENSIVE METABOLIC PANEL
ALBUMIN: 4.3 g/dL (ref 3.5–5.2)
ALT: 16 U/L (ref 0–53)
AST: 20 U/L (ref 0–37)
Alkaline Phosphatase: 90 U/L (ref 39–117)
BILIRUBIN TOTAL: 0.4 mg/dL (ref 0.2–1.2)
BUN: 20 mg/dL (ref 6–23)
CHLORIDE: 101 meq/L (ref 96–112)
CO2: 28 mEq/L (ref 19–32)
Calcium: 9.2 mg/dL (ref 8.4–10.5)
Creatinine, Ser: 0.98 mg/dL (ref 0.40–1.50)
GFR: 86.05 mL/min (ref >60.00)
Glucose, Bld: 74 mg/dL (ref 70–99)
POTASSIUM: 4 meq/L (ref 3.5–5.1)
SODIUM: 137 meq/L (ref 135–145)
TOTAL PROTEIN: 7.1 g/dL (ref 6.0–8.3)

## 2014-11-04 LAB — LIPID PANEL
CHOLESTEROL: 213 mg/dL — AB (ref 0–200)
HDL: 46.1 mg/dL (ref >39.00)
LDL Cholesterol: 144 mg/dL — ABNORMAL HIGH (ref 0–99)
NONHDL: 166.9
Total CHOL/HDL Ratio: 5
Triglycerides: 117 mg/dL (ref 0.0–149.0)
VLDL: 23.4 mg/dL (ref 0.0–40.0)

## 2014-11-04 LAB — LDL CHOLESTEROL, DIRECT: Direct LDL: 159 mg/dL

## 2014-11-04 LAB — HEMOGLOBIN A1C: Hgb A1c MFr Bld: 5.9 % (ref 4.6–6.5)

## 2014-11-05 ENCOUNTER — Encounter: Payer: Self-pay | Admitting: Internal Medicine

## 2014-11-06 NOTE — Assessment & Plan Note (Signed)
I have congratulated him in reduction of   BMI and encouraged  Continued weight loss with goal of 10% of body weight over the next 6 months using a low glycemic index diet and regular exercise a minimum of 5 days per week.

## 2014-11-06 NOTE — Assessment & Plan Note (Signed)
Well controlled on current regimen. Renal function stable, no changes today.  Lab Results  Component Value Date   CREATININE 0.98 11/03/2014   Lab Results  Component Value Date   NA 137 11/03/2014   K 4.0 11/03/2014   CL 101 11/03/2014   CO2 28 11/03/2014

## 2014-11-06 NOTE — Progress Notes (Signed)
Subjective:  Patient ID: [name], male    DOB: Mar 16, 1966  Age: 49 y.o. MRN: [id number]  CC: The primary encounter diagnosis was Fatty liver. Diagnoses of Hyperlipidemia associated with type 2 diabetes mellitus, Diabetes mellitus type 2 in obese, Chronic knee pain, right, Essential hypertension, and Overweight were also pertinent to this visit.  HPI [name] presents for follow up on diabetes mellitus, diet controlled with obesity and fatty liver.    He feels generally well, is exercising several times per week and checking blood sugars once daily at variable times.  BS have been under 130 fasting and < 150 post prandially.  Denies any recent hypoglyemic events.  Taking his medications as directed. Following a carbohydrate modified diet 6 days per week. Denies numbness, burning and tingling of extremities. Appetite is good.    Outpatient Prescriptions Prior to Visit  Medication Sig Dispense Refill  . ALPRAZolam (XANAX) 0.5 MG tablet TAKE 1 TABLET BY MOUTH AT BEDTIME AS NEEDED 30 tablet 5  . BAYER MICROLET LANCETS lancets Use as instructed 100 each 12  . buPROPion (WELLBUTRIN SR) 150 MG 12 hr tablet TAKE 1 TABLET (150 MG TOTAL) BY MOUTH 2 (TWO) TIMES DAILY. 60 tablet 2  . citalopram (CELEXA) 20 MG tablet TAKE 1 TABLET BY MOUTH DAILY. 30 tablet 2  . clidinium-chlordiazePOXIDE (LIBRAX) 5-2.5 MG per capsule TAKE 1 CAPSULE BY MOUTH 3 TIMES DAILY BEFORE MEALS *NEED APPOINTMENT BEFORE FURTHER REFILLS* 90 capsule 0  . levothyroxine (SYNTHROID, LEVOTHROID) 88 MCG tablet Take 1 tablet (88 mcg total) by mouth daily before breakfast. 30 tablet 3  . lisinopril-hydrochlorothiazide (PRINZIDE,ZESTORETIC) 20-25 MG per tablet TAKE 1 TABLET BY MOUTH EVERY DAY 30 tablet 5  . methocarbamol (ROBAXIN) 500 MG tablet Take 1 tablet (500 mg total) by mouth every 8 (eight) hours as needed for muscle spasms. 30 tablet 0  . metoprolol tartrate (LOPRESSOR) 25 MG tablet Take 1 tablet (25 mg total) by mouth 2  (two) times daily. 60 tablet 5  . Multiple Vitamin (MULTIVITAMIN) tablet Take 1 tablet by mouth daily.      Marland Kitchen omeprazole (PRILOSEC) 40 MG capsule TAKE ONE CAPSULE BY MOUTH TWICE A DAY 60 capsule 6  . doxycycline (VIBRA-TABS) 100 MG tablet Take 1 tablet (100 mg total) by mouth 2 (two) times daily. 20 tablet 0  . gentamicin ointment (GARAMYCIN) 0.1 % Apply 1 application topically 3 (three) times daily. 15 g 0  . lisinopril-hydrochlorothiazide (PRINZIDE,ZESTORETIC) 20-25 MG per tablet Take 1 tablet by mouth daily. 30 tablet 5  . rosuvastatin (CRESTOR) 20 MG tablet Take 1 tablet (20 mg total) by mouth daily. 30 tablet 11   No facility-administered medications prior to visit.    Review of Systems;  Patient denies headache, fevers, malaise, unintentional weight loss, skin rash, eye pain, sinus congestion and sinus pain, sore throat, dysphagia,  hemoptysis , cough, dyspnea, wheezing, chest pain, palpitations, orthopnea, edema, abdominal pain, nausea, melena, diarrhea, constipation, flank pain, dysuria, hematuria, urinary  Frequency, nocturia, numbness, tingling, seizures,  Focal weakness, Loss of consciousness,  Tremor, insomnia, depression, anxiety, and suicidal ideation.      Objective:  BP 130/70 mmHg  Pulse 63  Temp(Src) 98.3 F (36.8 C) (Oral)  Resp 14  Ht 6\' 1"  (1.854 m)  Wt 219 lb 12.8 oz (99.701 kg)  BMI 29.01 kg/m2  SpO2 96%  BP Readings from Last 3 Encounters:  11/03/14 130/70  09/24/14 118/70  08/04/14 138/72    Wt Readings from Last 3 Encounters:  11/03/14 219 lb 12.8 oz (99.701 kg)  09/24/14 212 lb 8 oz (96.389 kg)  08/04/14 212 lb 12 oz (96.503 kg)    General appearance: alert, cooperative and appears stated age Ears: normal TM's and external ear canals both ears Throat: lips, mucosa, and tongue normal; teeth and gums normal Neck: no adenopathy, no carotid bruit, supple, symmetrical, trachea midline and thyroid not enlarged, symmetric, no  tenderness/mass/nodules Back: symmetric, no curvature. ROM normal. No CVA tenderness. Lungs: clear to auscultation bilaterally Heart: regular rate and rhythm, S1, S2 normal, no murmur, click, rub or gallop Abdomen: soft, non-tender; bowel sounds normal; no masses,  no organomegaly Pulses: 2+ and symmetric Skin: Skin color, texture, turgor normal. No rashes or lesions Lymph nodes: Cervical, supraclavicular, and axillary nodes normal.  Lab Results  Component Value Date   HGBA1C 5.9 11/03/2014   HGBA1C 5.3 08/04/2014   HGBA1C 6.3 04/15/2014    Lab Results  Component Value Date   CREATININE 0.98 11/03/2014   CREATININE 1.18 08/17/2014   CREATININE 1.16 08/04/2014    Lab Results  Component Value Date   WBC 6.1 08/17/2014   HGB 13.5 08/17/2014   HCT 40.5 08/17/2014   PLT 305 08/17/2014   GLUCOSE 74 11/03/2014   CHOL 213* 11/03/2014   TRIG 117.0 11/03/2014   HDL 46.10 11/03/2014   LDLDIRECT 159.0 11/03/2014   LDLCALC 144* 11/03/2014   ALT 16 11/03/2014   AST 20 11/03/2014   NA 137 11/03/2014   K 4.0 11/03/2014   CL 101 11/03/2014   CREATININE 0.98 11/03/2014   BUN 20 11/03/2014   CO2 28 11/03/2014   TSH 2.58 10/27/2013   PSA 0.75 10/17/2011   INR 1.62* 06/26/2014   HGBA1C 5.9 11/03/2014   MICROALBUR 1.5 08/04/2014    No results found.  Assessment & Plan:   Problem List Items Addressed This Visit      Unprioritized   Essential hypertension    Well controlled on current regimen. Renal function stable, no changes today.  Lab Results  Component Value Date   CREATININE 0.98 11/03/2014   Lab Results  Component Value Date   NA 137 11/03/2014   K 4.0 11/03/2014   CL 101 11/03/2014   CO2 28 11/03/2014         Relevant Medications   rosuvastatin (CRESTOR) 20 MG tablet   Fatty liver - Primary     coonfirmed with liver biopsy done at [facility] Nov 2014.  He  is following a lower glycemic index, has improved glycrmic control and lost  Weight.  Liver enzymes are now  normal.     Lab Results  Component Value Date   ALT 16 11/03/2014   AST 20 11/03/2014   ALKPHOS 90 11/03/2014   BILITOT 0.4 11/03/2014                  Relevant Orders   Comprehensive metabolic panel (Completed)   Diabetes mellitus type 2 in obese    Currently well-controlled on diet alone  .  hemoglobin A1c is at goal of less than 7.0 . Patient is reminded to schedule an annual eye exam and foot exam is normal today. Patient has no microalbuminuria. Patient is tolerating statin therapy for CAD risk reduction and on ACE/ARB for renal protection and hypertension.  Lab Results  Component Value Date   HGBA1C 5.9 11/03/2014   Lab Results  Component Value Date   MICROALBUR 1.5 08/04/2014   Lab Results  Component Value Date   ALT  16 11/03/2014   AST 20 11/03/2014   ALKPHOS 90 11/03/2014   BILITOT 0.4 11/03/2014            Relevant Medications   rosuvastatin (CRESTOR) 20 MG tablet   Other Relevant Orders   Hemoglobin A1c (Completed)   Overweight    I have congratulated him in reduction of   BMI and encouraged  Continued weight loss with goal of 10% of body weight over the next 6 months using a low glycemic index diet and regular exercise a minimum of 5 days per week.        Chronic knee pain      continue vicodin  ,  Refills for #120 /month x 3 months given .  Narcotics contract in place.         Hyperlipidemia associated with type 2 diabetes mellitus   Relevant Medications   rosuvastatin (CRESTOR) 20 MG tablet   Other Relevant Orders   Lipid panel (Completed)   LDL cholesterol, direct (Completed)      I have discontinued Mr. Hanssen's doxycycline and gentamicin ointment. I have also changed his HYDROcodone-acetaminophen. Additionally, I am having him maintain his multivitamin, BAYER MICROLET LANCETS, omeprazole, metoprolol tartrate, methocarbamol, levothyroxine, buPROPion, lisinopril-hydrochlorothiazide, citalopram, clidinium-chlordiazePOXIDE, ALPRAZolam,  HYDROcodone-acetaminophen, and rosuvastatin.  Meds ordered this encounter  Medications  . DISCONTD: HYDROcodone-acetaminophen (NORCO) 10-325 MG per tablet    Sig: Take 1 tablet by mouth every 6 (six) hours as needed. for pain    Dispense:  30 tablet    Refill:  0  . HYDROcodone-acetaminophen (NORCO) 10-325 MG per tablet    Sig: Take 1 tablet by mouth every 6 (six) hours as needed. for pain.  May refill on or after December 04 2014    Dispense:  30 tablet    Refill:  0  . HYDROcodone-acetaminophen (NORCO) 10-325 MG per tablet    Sig: Take 1 tablet by mouth every 6 (six) hours as needed. for pain    Dispense:  30 tablet    Refill:  0  . rosuvastatin (CRESTOR) 20 MG tablet    Sig: Take 1 tablet (20 mg total) by mouth daily.    Dispense:  30 tablet    Refill:  11    Medications Discontinued During This Encounter  Medication Reason  . doxycycline (VIBRA-TABS) 100 MG tablet Completed Course  . gentamicin ointment (GARAMYCIN) 0.1 % Completed Course  . lisinopril-hydrochlorothiazide (PRINZIDE,ZESTORETIC) 20-25 MG per tablet Completed Course  . HYDROcodone-acetaminophen (NORCO) 10-325 MG per tablet Reorder  . rosuvastatin (CRESTOR) 20 MG tablet Reorder    Follow-up: Return in about 8 weeks (around 12/29/2014).   [name], MD

## 2014-11-06 NOTE — Assessment & Plan Note (Signed)
coonfirmed with liver biopsy done at [facility] Nov 2014.  He  is following a lower glycemic index, has improved glycrmic control and lost  Weight.  Liver enzymes are now normal.     Lab Results  Component Value Date   ALT 16 11/03/2014   AST 20 11/03/2014   ALKPHOS 90 11/03/2014   BILITOT 0.4 11/03/2014

## 2014-11-06 NOTE — Assessment & Plan Note (Signed)
continue vicodin  ,  Refills for #120 /month x 3 months given .  Narcotics contract in place.

## 2014-11-06 NOTE — Assessment & Plan Note (Signed)
Currently well-controlled on diet alone  .  hemoglobin A1c is at goal of less than 7.0 . Patient is reminded to schedule an annual eye exam and foot exam is normal today. Patient has no microalbuminuria. Patient is tolerating statin therapy for CAD risk reduction and on ACE/ARB for renal protection and hypertension.  Lab Results  Component Value Date   HGBA1C 5.9 11/03/2014   Lab Results  Component Value Date   MICROALBUR 1.5 08/04/2014   Lab Results  Component Value Date   ALT 16 11/03/2014   AST 20 11/03/2014   ALKPHOS 90 11/03/2014   BILITOT 0.4 11/03/2014

## 2014-11-17 ENCOUNTER — Other Ambulatory Visit: Payer: Self-pay | Admitting: Internal Medicine

## 2014-12-30 ENCOUNTER — Other Ambulatory Visit: Payer: Self-pay | Admitting: Internal Medicine

## 2015-01-12 ENCOUNTER — Other Ambulatory Visit: Payer: Self-pay | Admitting: Sports Medicine

## 2015-01-12 DIAGNOSIS — G8929 Other chronic pain: Secondary | ICD-10-CM

## 2015-01-12 DIAGNOSIS — M25512 Pain in left shoulder: Principal | ICD-10-CM

## 2015-01-19 ENCOUNTER — Other Ambulatory Visit: Payer: Self-pay | Admitting: Internal Medicine

## 2015-01-24 ENCOUNTER — Other Ambulatory Visit: Payer: Self-pay | Admitting: Physician Assistant

## 2015-01-27 ENCOUNTER — Ambulatory Visit
Admission: RE | Admit: 2015-01-27 | Discharge: 2015-01-27 | Disposition: A | Discharge status: Home / Self Care | Payer: BLUE CROSS/BLUE SHIELD | Source: Ambulatory Visit | Attending: Sports Medicine | Admitting: Sports Medicine

## 2015-01-27 DIAGNOSIS — M25512 Pain in left shoulder: Principal | ICD-10-CM

## 2015-01-27 DIAGNOSIS — G8929 Other chronic pain: Secondary | ICD-10-CM

## 2015-01-27 MED ORDER — IOHEXOL 180 MG/ML  SOLN
15.0000 mL | Freq: Once | INTRAMUSCULAR | Status: DC | PRN
  Administered 2015-01-27: 15 mL via INTRA_ARTICULAR

## 2015-02-01 ENCOUNTER — Other Ambulatory Visit: Payer: Self-pay | Admitting: Physician Assistant

## 2015-02-11 ENCOUNTER — Other Ambulatory Visit (HOSPITAL_COMMUNITY): Payer: Self-pay | Admitting: Orthopedic Surgery

## 2015-02-15 ENCOUNTER — Encounter (HOSPITAL_COMMUNITY): Payer: Self-pay | Admitting: *Deleted

## 2015-02-15 MED ORDER — CEFAZOLIN SODIUM-DEXTROSE 2-3 GM-% IV SOLR
2.0000 g | INTRAVENOUS | Status: AC
  Administered 2015-02-16 (×2): 2 g via INTRAVENOUS
  Filled 2015-02-15: qty 50

## 2015-02-15 MED ORDER — CHLORHEXIDINE GLUCONATE 4 % EX LIQD: 60.0000 mL | Freq: Once | CUTANEOUS | Status: DC

## 2015-02-16 ENCOUNTER — Ambulatory Visit (HOSPITAL_COMMUNITY): Payer: BLUE CROSS/BLUE SHIELD | Admitting: Anesthesiology

## 2015-02-16 ENCOUNTER — Encounter (HOSPITAL_COMMUNITY)
Admission: RE | Disposition: A | Discharge status: Home / Self Care | Payer: Self-pay | Source: Ambulatory Visit | Attending: Orthopedic Surgery

## 2015-02-16 ENCOUNTER — Ambulatory Visit (HOSPITAL_COMMUNITY)
Admission: RE | Admit: 2015-02-16 | Discharge: 2015-02-16 | Disposition: A | Discharge status: Home / Self Care | Payer: BLUE CROSS/BLUE SHIELD | Source: Ambulatory Visit | Attending: Orthopedic Surgery | Admitting: Orthopedic Surgery

## 2015-02-16 ENCOUNTER — Encounter (HOSPITAL_COMMUNITY): Payer: Self-pay | Admitting: *Deleted

## 2015-02-16 DIAGNOSIS — X58XXXA Exposure to other specified factors, initial encounter: Secondary | ICD-10-CM | POA: Insufficient documentation

## 2015-02-16 DIAGNOSIS — E785 Hyperlipidemia, unspecified: Secondary | ICD-10-CM | POA: Insufficient documentation

## 2015-02-16 DIAGNOSIS — Z7982 Long term (current) use of aspirin: Secondary | ICD-10-CM | POA: Insufficient documentation

## 2015-02-16 DIAGNOSIS — F329 Major depressive disorder, single episode, unspecified: Secondary | ICD-10-CM | POA: Diagnosis not present

## 2015-02-16 DIAGNOSIS — M75102 Unspecified rotator cuff tear or rupture of left shoulder, not specified as traumatic: Secondary | ICD-10-CM | POA: Insufficient documentation

## 2015-02-16 DIAGNOSIS — G4730 Sleep apnea, unspecified: Secondary | ICD-10-CM | POA: Insufficient documentation

## 2015-02-16 DIAGNOSIS — F419 Anxiety disorder, unspecified: Secondary | ICD-10-CM | POA: Diagnosis not present

## 2015-02-16 DIAGNOSIS — E119 Type 2 diabetes mellitus without complications: Secondary | ICD-10-CM | POA: Diagnosis not present

## 2015-02-16 DIAGNOSIS — M7522 Bicipital tendinitis, left shoulder: Secondary | ICD-10-CM | POA: Insufficient documentation

## 2015-02-16 DIAGNOSIS — I10 Essential (primary) hypertension: Secondary | ICD-10-CM | POA: Diagnosis not present

## 2015-02-16 DIAGNOSIS — Z87891 Personal history of nicotine dependence: Secondary | ICD-10-CM | POA: Diagnosis not present

## 2015-02-16 DIAGNOSIS — E039 Hypothyroidism, unspecified: Secondary | ICD-10-CM | POA: Insufficient documentation

## 2015-02-16 DIAGNOSIS — Y9289 Other specified places as the place of occurrence of the external cause: Secondary | ICD-10-CM | POA: Diagnosis not present

## 2015-02-16 DIAGNOSIS — K219 Gastro-esophageal reflux disease without esophagitis: Secondary | ICD-10-CM | POA: Insufficient documentation

## 2015-02-16 DIAGNOSIS — Y998 Other external cause status: Secondary | ICD-10-CM | POA: Insufficient documentation

## 2015-02-16 DIAGNOSIS — S43432A Superior glenoid labrum lesion of left shoulder, initial encounter: Secondary | ICD-10-CM | POA: Diagnosis not present

## 2015-02-16 DIAGNOSIS — Y9389 Activity, other specified: Secondary | ICD-10-CM | POA: Diagnosis not present

## 2015-02-16 LAB — BASIC METABOLIC PANEL
ANION GAP: 9 (ref 5–15)
BUN: 14 mg/dL (ref 6–20)
CALCIUM: 9.2 mg/dL (ref 8.9–10.3)
CHLORIDE: 103 mmol/L (ref 101–111)
CO2: 28 mmol/L (ref 22–32)
Creatinine, Ser: 0.87 mg/dL (ref 0.61–1.24)
GFR calc Af Amer: >60 mL/min (ref >60)
GFR calc non Af Amer: >60 mL/min (ref >60)
GLUCOSE: 110 mg/dL — AB (ref 65–99)
Potassium: 4.9 mmol/L (ref 3.5–5.1)
Sodium: 140 mmol/L (ref 135–145)

## 2015-02-16 LAB — CBC
HEMATOCRIT: 42.8 % (ref 39.0–52.0)
HEMOGLOBIN: 14.1 g/dL (ref 13.0–17.0)
MCH: 30.6 pg (ref 26.0–34.0)
MCHC: 32.9 g/dL (ref 30.0–36.0)
MCV: 92.8 fL (ref 78.0–100.0)
Platelets: 259 10*3/uL (ref 150–400)
RBC: 4.61 MIL/uL (ref 4.22–5.81)
RDW: 12.8 % (ref 11.5–15.5)
WBC: 3.5 10*3/uL — ABNORMAL LOW (ref 4.0–10.5)

## 2015-02-16 SURGERY — SHOULDER ARTHROSCOPY WITH ROTATOR CUFF REPAIR AND OPEN BICEPS TENODESIS
Anesthesia: General | Site: Shoulder | Laterality: Left

## 2015-02-16 MED ORDER — PHENYLEPHRINE HCL 10 MG/ML IJ SOLN
INTRAMUSCULAR | Status: AC
  Filled 2015-02-16: qty 1

## 2015-02-16 MED ORDER — ROCURONIUM BROMIDE 100 MG/10ML IV SOLN
INTRAVENOUS | Status: DC | PRN
  Administered 2015-02-16: 50 mg via INTRAVENOUS

## 2015-02-16 MED ORDER — NEOSTIGMINE METHYLSULFATE 10 MG/10ML IV SOLN
INTRAVENOUS | Status: DC | PRN
  Administered 2015-02-16: 2 mg via INTRAVENOUS

## 2015-02-16 MED ORDER — BUPIVACAINE HCL (PF) 0.25 % IJ SOLN
INTRAMUSCULAR | Status: AC
  Filled 2015-02-16: qty 30

## 2015-02-16 MED ORDER — PHENYLEPHRINE HCL 10 MG/ML IJ SOLN
10.0000 mg | INTRAVENOUS | Status: DC | PRN
  Administered 2015-02-16: 17:00:00 via INTRAVENOUS
  Administered 2015-02-16: 80 ug/min via INTRAVENOUS

## 2015-02-16 MED ORDER — LACTATED RINGERS IV SOLN
INTRAVENOUS | Status: DC
  Administered 2015-02-16 (×3): via INTRAVENOUS

## 2015-02-16 MED ORDER — EPINEPHRINE HCL 1 MG/ML IJ SOLN
INTRAMUSCULAR | Status: AC
  Filled 2015-02-16: qty 1

## 2015-02-16 MED ORDER — EPINEPHRINE HCL 1 MG/ML IJ SOLN
INTRAMUSCULAR | Status: DC | PRN
  Administered 2015-02-16: .1 mg

## 2015-02-16 MED ORDER — SODIUM CHLORIDE 0.9 % IR SOLN
Status: DC | PRN
  Administered 2015-02-16: 6000 mL

## 2015-02-16 MED ORDER — MIDAZOLAM HCL 2 MG/2ML IJ SOLN
INTRAMUSCULAR | Status: AC
  Filled 2015-02-16: qty 4

## 2015-02-16 MED ORDER — ALBUMIN HUMAN 5 % IV SOLN
INTRAVENOUS | Status: DC | PRN
  Administered 2015-02-16 (×2): via INTRAVENOUS

## 2015-02-16 MED ORDER — MIDAZOLAM HCL 2 MG/2ML IJ SOLN
INTRAMUSCULAR | Status: AC
  Administered 2015-02-16: 2 mg via INTRAVENOUS
  Filled 2015-02-16: qty 2

## 2015-02-16 MED ORDER — HYDROMORPHONE HCL 1 MG/ML IJ SOLN
INTRAMUSCULAR | Status: AC
  Filled 2015-02-16: qty 1

## 2015-02-16 MED ORDER — PROPOFOL 10 MG/ML IV BOLUS
INTRAVENOUS | Status: AC
  Filled 2015-02-16: qty 20

## 2015-02-16 MED ORDER — ONDANSETRON HCL 4 MG/2ML IJ SOLN
INTRAMUSCULAR | Status: AC
  Filled 2015-02-16: qty 2

## 2015-02-16 MED ORDER — ONDANSETRON HCL 4 MG/2ML IJ SOLN
4.0000 mg | Freq: Once | INTRAMUSCULAR | Status: AC | PRN
  Administered 2015-02-16: 4 mg via INTRAVENOUS

## 2015-02-16 MED ORDER — HYDROMORPHONE HCL 1 MG/ML IJ SOLN
0.2500 mg | INTRAMUSCULAR | Status: DC | PRN
  Administered 2015-02-16 (×4): 0.5 mg via INTRAVENOUS

## 2015-02-16 MED ORDER — METHOCARBAMOL 500 MG PO TABS
ORAL_TABLET | ORAL | Status: AC
  Administered 2015-02-16: 500 mg
  Filled 2015-02-16: qty 1

## 2015-02-16 MED ORDER — FENTANYL CITRATE (PF) 100 MCG/2ML IJ SOLN
INTRAMUSCULAR | Status: AC
  Administered 2015-02-16: 100 ug via INTRAVENOUS
  Filled 2015-02-16: qty 2

## 2015-02-16 MED ORDER — FENTANYL CITRATE (PF) 250 MCG/5ML IJ SOLN
INTRAMUSCULAR | Status: AC
  Filled 2015-02-16: qty 5

## 2015-02-16 MED ORDER — LIDOCAINE HCL (CARDIAC) 20 MG/ML IV SOLN
INTRAVENOUS | Status: DC | PRN
  Administered 2015-02-16: 30 mg via INTRAVENOUS

## 2015-02-16 MED ORDER — ONDANSETRON HCL 4 MG/2ML IJ SOLN
INTRAMUSCULAR | Status: DC | PRN
  Administered 2015-02-16: 4 mg via INTRAVENOUS

## 2015-02-16 MED ORDER — OXYCODONE HCL 5 MG PO TABS
5.0000 mg | ORAL_TABLET | Freq: Once | ORAL | Status: AC | PRN
  Administered 2015-02-16: 5 mg via ORAL

## 2015-02-16 MED ORDER — GLYCOPYRROLATE 0.2 MG/ML IJ SOLN
INTRAMUSCULAR | Status: DC | PRN
  Administered 2015-02-16: .15 mg via INTRAVENOUS

## 2015-02-16 MED ORDER — FENTANYL CITRATE (PF) 100 MCG/2ML IJ SOLN
INTRAMUSCULAR | Status: DC | PRN
  Administered 2015-02-16: 100 ug via INTRAVENOUS
  Administered 2015-02-16: 50 ug via INTRAVENOUS

## 2015-02-16 MED ORDER — ARTIFICIAL TEARS OP OINT
TOPICAL_OINTMENT | OPHTHALMIC | Status: DC | PRN
  Administered 2015-02-16: 1 via OPHTHALMIC

## 2015-02-16 MED ORDER — OXYCODONE HCL 5 MG PO TABS
ORAL_TABLET | ORAL | Status: AC
  Filled 2015-02-16: qty 1

## 2015-02-16 MED ORDER — OXYCODONE HCL 5 MG/5ML PO SOLN: 5.0000 mg | Freq: Once | ORAL | Status: AC | PRN

## 2015-02-16 MED ORDER — BUPIVACAINE HCL (PF) 0.25 % IJ SOLN
INTRAMUSCULAR | Status: DC | PRN
  Administered 2015-02-16: 10 mL

## 2015-02-16 MED ORDER — SODIUM CHLORIDE 0.9 % IJ SOLN
INTRAMUSCULAR | Status: DC | PRN
  Administered 2015-02-16: 50 mL

## 2015-02-16 MED ORDER — PROPOFOL 10 MG/ML IV BOLUS
INTRAVENOUS | Status: DC | PRN
Start: 2015-02-16 — End: 2015-02-16
  Administered 2015-02-16: 180 mg via INTRAVENOUS

## 2015-02-16 SURGICAL SUPPLY — 72 items
ANCHOR PEEK CORKSCREW 4.5 (Anchor) ×2 IMPLANT
ANCHOR SUT  BIOC ST 3X14.5 (Orthopedic Implant) ×2 IMPLANT
ANCHOR SUT BIO SW 4.75X19.1 (Anchor) ×8 IMPLANT
ANCHOR SUT BIOC ST 3X14.5 (Orthopedic Implant) ×2 IMPLANT
BENZOIN TINCTURE PRP APPL 2/3 (GAUZE/BANDAGES/DRESSINGS) ×2 IMPLANT
BLADE CUTTER GATOR 3.5 (BLADE) ×2 IMPLANT
BLADE GREAT WHITE 4.2 (BLADE) ×2 IMPLANT
BLADE SURG 11 STRL SS (BLADE) ×2 IMPLANT
BUR OVAL 4.0 (BURR) ×2 IMPLANT
BUR OVAL 6.0 (BURR) ×2 IMPLANT
CANNULA TWIST IN 8.25X7CM (CANNULA) ×2 IMPLANT
CLSR STERI-STRIP ANTIMIC 1/2X4 (GAUZE/BANDAGES/DRESSINGS) ×2 IMPLANT
COVER SURGICAL LIGHT HANDLE (MISCELLANEOUS) ×2 IMPLANT
DRAPE INCISE IOBAN 66X45 STRL (DRAPES) ×4 IMPLANT
DRAPE STERI 35X30 U-POUCH (DRAPES) ×2 IMPLANT
DRAPE U-SHAPE 47X51 STRL (DRAPES) ×4 IMPLANT
DRSG MEPILEX BORDER 4X4 (GAUZE/BANDAGES/DRESSINGS) ×2 IMPLANT
DRSG MEPILEX BORDER 4X8 (GAUZE/BANDAGES/DRESSINGS) ×2 IMPLANT
DRSG PAD ABDOMINAL 8X10 ST (GAUZE/BANDAGES/DRESSINGS) ×6 IMPLANT
DURAPREP 26ML APPLICATOR (WOUND CARE) ×2 IMPLANT
ELECT REM PT RETURN 9FT ADLT (ELECTROSURGICAL) ×2
ELECTRODE REM PT RTRN 9FT ADLT (ELECTROSURGICAL) ×1 IMPLANT
FILTER STRAW FLUID ASPIR (MISCELLANEOUS) ×2 IMPLANT
GAUZE SPONGE 4X4 12PLY STRL (GAUZE/BANDAGES/DRESSINGS) ×2 IMPLANT
GAUZE XEROFORM 1X8 LF (GAUZE/BANDAGES/DRESSINGS) ×2 IMPLANT
GLOVE BIO SURGEON ST LM GN SZ9 (GLOVE) ×2 IMPLANT
GLOVE BIOGEL PI IND STRL 8 (GLOVE) ×1 IMPLANT
GLOVE BIOGEL PI INDICATOR 8 (GLOVE) ×1
GLOVE SURG ORTHO 8.0 STRL STRW (GLOVE) ×2 IMPLANT
GOWN STRL REUS W/ TWL LRG LVL3 (GOWN DISPOSABLE) ×3 IMPLANT
GOWN STRL REUS W/TWL LRG LVL3 (GOWN DISPOSABLE) ×3
KIT BASIN OR (CUSTOM PROCEDURE TRAY) ×2 IMPLANT
KIT ROOM TURNOVER OR (KITS) ×2 IMPLANT
KIT SUTURETAK 3.0 INSERT PERC (KITS) ×2 IMPLANT
LASSO CRESCENT QUICKPASS (SUTURE) ×2 IMPLANT
MANIFOLD NEPTUNE II (INSTRUMENTS) ×2 IMPLANT
NDL SUT 6 .5 CRC .975X.05 MAYO (NEEDLE) ×1 IMPLANT
NEEDLE HYPO 25X1 1.5 SAFETY (NEEDLE) ×2 IMPLANT
NEEDLE MAYO TAPER (NEEDLE) ×1
NEEDLE SCORPION MULTI FIRE (NEEDLE) ×2 IMPLANT
NEEDLE SPNL 18GX3.5 QUINCKE PK (NEEDLE) ×4 IMPLANT
NS IRRIG 1000ML POUR BTL (IV SOLUTION) ×2 IMPLANT
PACK SHOULDER (CUSTOM PROCEDURE TRAY) ×2 IMPLANT
PAD ARMBOARD 7.5X6 YLW CONV (MISCELLANEOUS) ×4 IMPLANT
PROBE MEASUREMENT AR SCOPE 60D (MISCELLANEOUS) ×2 IMPLANT
PUSHLOCK PEEK 4.5X24 (Orthopedic Implant) ×2 IMPLANT
RESTRAINT HEAD UNIVERSAL NS (MISCELLANEOUS) ×2 IMPLANT
SCREW TENODESIS BIOCOMP 7MM (Screw) ×2 IMPLANT
SET ARTHROSCOPY TUBING (MISCELLANEOUS) ×1
SET ARTHROSCOPY TUBING LN (MISCELLANEOUS) ×1 IMPLANT
SLING ARM IMMOBILIZER LRG (SOFTGOODS) IMPLANT
SPONGE LAP 4X18 X RAY DECT (DISPOSABLE) ×4 IMPLANT
STRIP CLOSURE SKIN 1/2X4 (GAUZE/BANDAGES/DRESSINGS) ×2 IMPLANT
SUCTION FRAZIER TIP 10 FR DISP (SUCTIONS) ×2 IMPLANT
SUT ETHILON 3 0 PS 1 (SUTURE) ×2 IMPLANT
SUT FIBERWIRE #2 38 T-5 BLUE (SUTURE)
SUT PROLENE 3 0 PS 2 (SUTURE) ×2 IMPLANT
SUT VIC AB 0 CT1 27 (SUTURE) ×2
SUT VIC AB 0 CT1 27XBRD ANBCTR (SUTURE) ×2 IMPLANT
SUT VIC AB 1 CT1 27 (SUTURE) ×1
SUT VIC AB 1 CT1 27XBRD ANBCTR (SUTURE) ×1 IMPLANT
SUT VIC AB 2-0 CT1 27 (SUTURE) ×1
SUT VIC AB 2-0 CT1 TAPERPNT 27 (SUTURE) ×1 IMPLANT
SUTURE FIBERWR #2 38 T-5 BLUE (SUTURE) IMPLANT
SYR 20CC LL (SYRINGE) ×4 IMPLANT
SYR 3ML LL SCALE MARK (SYRINGE) ×2 IMPLANT
SYR TB 1ML LUER SLIP (SYRINGE) ×2 IMPLANT
TISSUE ARTHOFLEX THICK 3MM (Tissue) ×2 IMPLANT
TOWEL OR 17X24 6PK STRL BLUE (TOWEL DISPOSABLE) ×2 IMPLANT
TOWEL OR 17X26 10 PK STRL BLUE (TOWEL DISPOSABLE) ×2 IMPLANT
WAND HAND CNTRL MULTIVAC 90 (MISCELLANEOUS) ×2 IMPLANT
WATER STERILE IRR 1000ML POUR (IV SOLUTION) ×2 IMPLANT

## 2015-02-16 NOTE — H&P (Signed)
[name] is an 49 y.o. male.   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 Patient reports pain and weakness in the left shoulder there is been refractory to nonoperative management does not report any prior symptoms with the shoulder prior to the injury. MRI scanning demonstrates significant rotator cuff tear with retraction but no muscle atrophy of the supraspinatus and infraspinatus no family history of DVT or pulmonary embolism patient is doing well from his right knee surgery revision  Past Medical History  Diagnosis Date  . Hypertension     essential, benign  . Anxiety and depression   . Chest pain     hx  . HLD (hyperlipidemia)   . Palpitation     hx  . Other chest pain     tightness, pressure  . Precordial pain   . Bruit     L  . Impotence of organic origin   . Edema   . Goiter   . Anxiety   . Sleep apnea     uses cpap  . Diabetes mellitus without complication ([place])     "borderline"  . Hypothyroidism   . GERD (gastroesophageal reflux disease)     uses Omeprazole  . Fatty liver   . Murmur     Past Surgical History  Procedure Laterality Date  . Appendectomy  2002    done at [facility]  . Total knee arthroplasty Right 2011    right  . Hernia repair  7-89-38    umbilical  . Joint replacement      right knee  . Tonsillectomy    . Colonoscopy    . Cardiac catheterization    . Revision total knee arthroplasty Right 06/23/2014    DR Marlou Sa  . Total knee revision Right 06/23/2014    Procedure: TOTAL KNEE REVISION;  Surgeon: Meredith Pel, MD;  Location: [facility];  Service: Orthopedics;  Laterality: Right;    Family History  Problem Relation Age of Onset  . Heart disease Father    Social History:  reports that he quit smoking about 9 years ago. His smoking use included Cigarettes. He has a 36 pack-year smoking history. He has never used  smokeless tobacco. He reports that he drinks about 10.2 oz of alcohol per week. He reports that he does not use illicit drugs.  Allergies: No Known Allergies  Medications Prior to Admission  Medication Sig Dispense Refill  . ALPRAZolam (XANAX) 0.5 MG tablet TAKE 1 TABLET BY MOUTH AT BEDTIME AS NEEDED (Patient taking differently: Take 0.5 mg by mouth at bedtime. ) 30 tablet 5  . aspirin EC 81 MG tablet Take 81 mg by mouth daily.    Marland Kitchen buPROPion (WELLBUTRIN SR) 150 MG 12 hr tablet TAKE 1 TABLET (150 MG TOTAL) BY MOUTH 2 (TWO) TIMES DAILY. 60 tablet 2  . citalopram (CELEXA) 20 MG tablet TAKE 1 TABLET BY MOUTH DAILY. 30 tablet 4  . clidinium-chlordiazePOXIDE (LIBRAX) 5-2.5 MG capsule Take 1 capsule by mouth 3 (three) times daily before meals. 90 capsule 0  . gabapentin (NEURONTIN) 100 MG capsule Take 100 mg by mouth 3 (three) times daily.  0  . HYDROcodone-acetaminophen (NORCO) 10-325 MG tablet Take 1 tablet by mouth every 6 (six) hours as needed (pain).   0  . ibuprofen (ADVIL,MOTRIN) 200 MG tablet Take 400 mg by mouth every 6 (six) hours as needed (  pain).    . levothyroxine (SYNTHROID, LEVOTHROID) 88 MCG tablet TAKE 1 TABLET BY MOUTH DAILY BEFORE BREAKFAST. 30 tablet 2  . lisinopril-hydrochlorothiazide (PRINZIDE,ZESTORETIC) 20-25 MG per tablet TAKE 1 TABLET BY MOUTH EVERY DAY 30 tablet 5  . methocarbamol (ROBAXIN) 500 MG tablet Take 1 tablet (500 mg total) by mouth every 8 (eight) hours as needed for muscle spasms. 30 tablet 0  . metoprolol tartrate (LOPRESSOR) 25 MG tablet TAKE 1 TABLET (25 MG TOTAL) BY MOUTH 2 (TWO) TIMES DAILY. 60 tablet 2  . Multiple Vitamin (MULTIVITAMIN WITH MINERALS) TABS tablet Take 1 tablet by mouth daily.    Marland Kitchen omeprazole (PRILOSEC) 40 MG capsule TAKE ONE CAPSULE BY MOUTH TWICE A DAY 60 capsule 6  . oxyCODONE (OXY IR/ROXICODONE) 5 MG immediate release tablet Take 5 mg by mouth 2 (two) times daily as needed for breakthrough pain.   0  . Polyethyl Glycol-Propyl Glycol  (SYSTANE OP) Place 1 drop into both eyes daily as needed (burning/irritation).    . rosuvastatin (CRESTOR) 20 MG tablet Take 1 tablet (20 mg total) by mouth daily. 30 tablet 11  . BAYER MICROLET LANCETS lancets Use as instructed 100 each 12    Results for orders placed or performed during the hospital encounter of 02/16/15 (from the past 48 hour(s))  Basic metabolic panel     Status: Abnormal   Collection Time: 02/16/15 10:32 AM  Result Value Ref Range   Sodium 140 135 - 145 mmol/L   Potassium 4.9 3.5 - 5.1 mmol/L   Chloride 103 101 - 111 mmol/L   CO2 28 22 - 32 mmol/L   Glucose, Bld 110 (H) 65 - 99 mg/dL   BUN 14 6 - 20 mg/dL   Creatinine, Ser 0.87 0.61 - 1.24 mg/dL   Calcium 9.2 8.9 - 10.3 mg/dL   GFR calc non Af Amer >60 >60 mL/min   GFR calc Af Amer >60 >60 mL/min    Comment: (NOTE) The eGFR has been calculated using the CKD EPI equation. This calculation has not been validated in all clinical situations. eGFR's persistently <60 mL/min signify possible Chronic Kidney Disease.    Anion gap 9 5 - 15  CBC     Status: Abnormal   Collection Time: 02/16/15 10:32 AM  Result Value Ref Range   WBC 3.5 (L) 4.0 - 10.5 K/uL   RBC 4.61 4.22 - 5.81 MIL/uL   Hemoglobin 14.1 13.0 - 17.0 g/dL   HCT 42.8 39.0 - 52.0 %   MCV 92.8 78.0 - 100.0 fL   MCH 30.6 26.0 - 34.0 pg   MCHC 32.9 30.0 - 36.0 g/dL   RDW 12.8 11.5 - 15.5 %   Platelets 259 150 - 400 K/uL   No results found.  Review of Systems  Constitutional: Negative.   HENT: Negative.   Eyes: Negative.   Respiratory: Negative.   Cardiovascular: Negative.   Gastrointestinal: Negative.   Genitourinary: Negative.   Musculoskeletal: Positive for joint pain.  Skin: Negative.   Neurological: Negative.   Endo/Heme/Allergies: Negative.   Psychiatric/Behavioral: Negative.     Blood pressure 133/83, pulse 59, temperature 97.1 F (36.2 C), temperature source Oral, resp. rate 13, height 6' (1.829 m), weight 95.255 kg (210 lb), SpO2 95  %. Physical Exam  Constitutional: He appears well-developed.  HENT:  Head: Normocephalic.  Eyes: Pupils are equal, round, and reactive to light.  Neck: Normal range of motion.  Cardiovascular: Normal rate.   Respiratory: Effort normal.  GI: Soft.  Neurological:  He is alert.  Skin: Skin is warm.  Psychiatric: He has a normal mood and affect.   examination of the left shoulder demonstrates full passive range of motion but weakness to external rotation strength. Has weakness to abduction as well. No Popeye deformity is present. Coarse grinding and crepitus present with passive range of motion of the shoulder there is no before meals joint tenderness   Assessment/Plan Impression is left shoulder rotator cuff tear. Plan patient has a large rotator cuff tear but without muscle atrophy. I think the tears retracted but may be repairable. Plan at this time is for arthroscopy biceps tendon release attempted rotator cuff repair does not possible or if we cannot reestablish the anterior posterior force couple on the shoulder then we will proceed with superior capsular reconstruction. Risk and benefits of either procedure discussed with patient we will and to infection or vessel damage shoulder stiffness weakness in complete healing as well as potential need for more surgery all questions answered  Jorma Tassinari SCOTT 02/16/2015, 1:00 PM

## 2015-02-16 NOTE — Brief Op Note (Signed)
02/16/2015  5:50 PM  PATIENT:  Sunday Corn  49 y.o. male  PRE-OPERATIVE DIAGNOSIS:  Left Shoulder Rotator Cuff Tear  POST-OPERATIVE DIAGNOSIS:  Left Shoulder Rotator Cuff Tear  PROCEDURE:  Procedure(s): LEFT SHOULDER ARTHROSCOPY WITHAND BICEPS TENODESIS,SUPERIOR CAPSULAR RECONSTRUCTION.  SURGEON:  Surgeon(s): Meredith Pel, MD  ASSISTANT: Clearnce Hasten RNFA  ANESTHESIA:   general  EBL: 25 ml    Total I/O In: 2500 [I.V.:2000; IV Piggyback:500] Out: 250 [Blood:250]  BLOOD ADMINISTERED: none  DRAINS: none   LOCAL MEDICATIONS USED:  none  SPECIMEN:  No Specimen  COUNTS:  YES  TOURNIQUET:  * No tourniquets in log *  DICTATION: .Other Dictation: Dictation Number [id number]  PLAN OF CARE: Discharge to home after PACU  PATIENT DISPOSITION:  PACU - hemodynamically stable

## 2015-02-16 NOTE — Progress Notes (Signed)
Orthopedic Tech Progress Note Patient Details:  [name] [birth year] [id number] Ordered by Dr. Alycia Rossetti Devices Type of Ortho Device: Shoulder abduction pillow Ortho Device/Splint Interventions: [name] 02/16/2015, 5:55 PM

## 2015-02-16 NOTE — Transfer of Care (Signed)
Immediate Anesthesia Transfer of Care Note  Patient: [name]  Procedure(s) Performed: Procedure(s) with comments: LEFT SHOULDER ARTHROSCOPY WITH ROTATOR CUFF REPAIR AND BICEPS TENODESIS, POSSIBLE SUPERIOR CAPSULAR RECONSTRUCTION. (Left) - LEFT SHOULDER DIAGNOSTIC OPERATIVE ARTHROSCOPY, BICEPS TENODESIS, ROTATOR CUFF REPAIR, POSSIBLE SUPERIOR CAPSULAR RECONSTRUCTION.   Patient Location: PACU  Anesthesia Type:General  Level of Consciousness: awake, alert  and oriented  Airway & Oxygen Therapy: Patient Spontanous Breathing and Patient connected to nasal cannula oxygen  Post-op Assessment: Report given to RN and Post -op Vital signs reviewed and stable  Post vital signs: Reviewed and stable  Last Vitals:  Filed Vitals:   02/16/15 1815  BP: 153/80  Pulse:   Temp:   Resp:     Complications: No apparent anesthesia complications

## 2015-02-16 NOTE — Anesthesia Procedure Notes (Addendum)
Procedure Name: Intubation Date/Time: 02/16/2015 1:32 PM Performed by: Williemae Area B Pre-anesthesia Checklist: Patient identified, Emergency Drugs available, Suction available and Patient being monitored Patient Re-evaluated:Patient Re-evaluated prior to inductionOxygen Delivery Method: Circle system utilized Preoxygenation: Pre-oxygenation with 100% oxygen Intubation Type: IV induction Ventilation: Mask ventilation without difficulty and Oral airway inserted - appropriate to patient size Laryngoscope Size: Mac, 4 and Glidescope Grade View: Grade II Tube type: Oral Tube size: 7.5 mm Number of attempts: 1 Airway Equipment and Method: Stylet and Video-laryngoscopy Placement Confirmation: ETT inserted through vocal cords under direct vision,  breath sounds checked- equal and bilateral and positive ETCO2 Secured at: 21 (cm at teeth) cm Tube secured with: Tape Dental Injury: Teeth and Oropharynx as per pre-operative assessment     Anesthesia Regional Block:  Interscalene brachial plexus block  Pre-Anesthetic Checklist: ,, timeout performed, Correct Patient, Correct Site, Correct Laterality, Correct Procedure, Correct Position, site marked, Risks and benefits discussed,  Surgical consent,  Pre-op evaluation,  At surgeon's request and post-op pain management  Laterality: Left  Prep: chloraprep       Needles:  Injection technique: Single-shot  Needle Type: Echogenic Stimulator Needle     Needle Length: 9cm 9 cm Needle Gauge: 21 and 21 G    Additional Needles:  Procedures: ultrasound guided (picture in chart) Interscalene brachial plexus block Narrative:  Start time: 02/16/2015 1:45 PM End time: 02/16/2015 1:50 PM Injection made incrementally with aspirations every 5 mL.  Performed by: Personally   Additional Notes: 20 cc 0.5% Bupivacaine 1:200 Epi injected easily

## 2015-02-16 NOTE — Anesthesia Preprocedure Evaluation (Addendum)
Anesthesia Evaluation  Patient identified by MRN, date of birth, ID band Patient awake    Reviewed: Allergy & Precautions, NPO status , Patient's Chart, lab work & pertinent test results  Airway Mallampati: II  TM Distance: >3 FB Neck ROM: Full    Dental  (+) Teeth Intact, Dental Advisory Given, Caps   Pulmonary sleep apnea and Continuous Positive Airway Pressure Ventilation , former smoker,    breath sounds clear to auscultation       Cardiovascular hypertension, Pt. on home beta blockers and Pt. on medications  Rhythm:Regular Rate:Normal     Neuro/Psych    GI/Hepatic   Endo/Other  diabetes, Well Controlled, Type 2Hypothyroidism   Renal/GU      Musculoskeletal   Abdominal   Peds  Hematology   Anesthesia Other Findings Crowns in back  Reproductive/Obstetrics                            Anesthesia Physical Anesthesia Plan  ASA: III  Anesthesia Plan: General   Post-op Pain Management:    Induction: Intravenous  Airway Management Planned: Oral ETT  Additional Equipment:   Intra-op Plan:   Post-operative Plan:   Informed Consent: I have reviewed the patients History and Physical, chart, labs and discussed the procedure including the risks, benefits and alternatives for the proposed anesthesia with the patient or authorized representative who has indicated his/her understanding and acceptance.   Dental advisory given  Plan Discussed with: CRNA and Anesthesiologist  Anesthesia Plan Comments:         Anesthesia Quick Evaluation

## 2015-02-16 NOTE — Anesthesia Postprocedure Evaluation (Signed)
  Anesthesia Post-op Note  Patient: [name]  Procedure(s) Performed: Procedure(s) with comments: LEFT SHOULDER ARTHROSCOPY WITH ROTATOR CUFF REPAIR AND BICEPS TENODESIS, POSSIBLE SUPERIOR CAPSULAR RECONSTRUCTION. (Left) - LEFT SHOULDER DIAGNOSTIC OPERATIVE ARTHROSCOPY, BICEPS TENODESIS, ROTATOR CUFF REPAIR, POSSIBLE SUPERIOR CAPSULAR RECONSTRUCTION.   Patient Location: PACU  Anesthesia Type: General   Level of Consciousness: awake, alert  and oriented  Airway and Oxygen Therapy: Patient Spontanous Breathing  Post-op Pain: moderate  Post-op Assessment: Post-op Vital signs reviewed  Post-op Vital Signs: Reviewed  Last Vitals:  Filed Vitals:   02/16/15 1915  BP:   Pulse: 92  Temp: 36.8 C  Resp: 14    Complications: No apparent anesthesia complications

## 2015-02-17 ENCOUNTER — Other Ambulatory Visit: Payer: Self-pay | Admitting: Internal Medicine

## 2015-02-17 NOTE — Op Note (Signed)
NAME:  Joshua, [name] NO.:  [id number]  MEDICAL RECORD NO.:  [id number]  LOCATION:  [facility]                         FACILITY:  [facility]  PHYSICIAN:  Anderson Malta, M.D.    DATE OF BIRTH:  Mar 16, 1966  DATE OF PROCEDURE: DATE OF DISCHARGE:  02/16/2015                              OPERATIVE REPORT   PREOPERATIVE DIAGNOSIS:  Left shoulder rotator cuff tear biceps tendonitis.  POSTOPERATIVE DIAGNOSIS:  Left shoulder rotator cuff tear biceps tendonitis.  PROCEDURE:  Left shoulder arthroscopy, labral debridement, biceps tendon release, attempted rotator cuff repair, open superior capsular reconstruction.  SURGEON:  Anderson Malta, MD  ASSISTANT:  Laure Kidney.  INDICATIONS:  [name] is a 49 year old patient with left shoulder pain presents for operative management of massive supraspinatus infraspinatus rotator cuff tear after explanation of risks and benefits.  OPERATIVE FINDINGS: 1. The patient had excellent passive range of motion with full forward     flexion, external rotation 15 degrees, abduction to about 60     degrees, femoral abduction was about 95 degrees. 2. Diagnostic arthroscopy. 3. Retracted tears of the supraspinatus and infraspinatus. 4. SLAP tear and biceps tendonitis. 5. Generally intact glenohumeral articular surfaces. 6. Mild bursitis.  PROCEDURE IN DETAIL:  The patient was brought to operating room where general endotracheal anesthesia was induced.  Preop antibiotics were administered.  Time-out was called.  The patient was placed in a beach- chair position with the head in neutral position.  Left shoulder was examined under anesthesia.  SCDs were placed.  Time-out was called. Left shoulder was prescrubbed with alcohol and Betadine and allowed to air dry, prepped with DuraPrep solution, draped in sterile manner. [name] was used to cover the axilla.  Posterior portal was created 2 cm medial and inferior to posterolateral margin of the  acromion. Diagnostic arthroscopy was performed.  Anterior portal created under direct visualization.  The patient had some remnants of rotator cuff, mostly bursitis was present.  The infraspinatus was retracted.  Biceps tendon had significant tendinitis and was released.  Labrum was debrided extensively.  An attempt was then made using both Arthrocare wand and the periosteal elevator to develop a plane between the rotator cuff in the glenoid.  Some mobilization was possible.  At this time, it was determined to make an attempt at repair.  Instruments were removed. Incision was made off the anterolateral margin of the acromion and deltoid split to measured distance of 4 cm which was marked with #1 Vicryl suture.  The remnant rotator cuff was attempted to be mobilized 1- 2 cm which was not enough to come near the articular margin footprint interface.  At this time, it was decided to perform superior capsule reconstruction, deltoid split closed, arthroscopy reperformed.  Superior glenoid was prepared with a rasp and bur and then 2 anchors were placed at about the 11 o'clock and 2 o'clock position.  This separation here was measured at about 20 mm.  The suture anchors were then placed percutaneously.  Posterior anchor was 2.9, anterior anchor had to be changed over to a 4.5 corkscrew because of the bone, but it did obtain good purchase.  Measurements were then made anterior to  posterior and medial to lateral.  The deltoid split was then reopened and superior capsular allograft was then cut to the appropriate size.  Suture was passed through the medial limb and then tied down.  Four sutures passed through each of the 2 limbs.  At this time, there was really no posterior infraspinatus with which to suture the graft.  SwiveLock were then placed at the anterior interface between the humeral head and the footprint as well as the posterior interface between the humeral head and the footprint near  the infraspinatus attachment.  Prior to doing this, the biceps tendon was tenodesed into the bicipital groove using a 7 mm bioabsorbable corkscrew.  Then, at this time, the graft was placed and under appropriate tension, holes were placed in the lateral aspect of the graft through which 2 tapes were placed through the anterior hole, 2 tapes placed through the posterior hole, and then the 2 other sutures then placed into the graft to achieve spread and tension.  At this time, with pulling under appropriate tension, the graft was secured using 2 SwiveLock using cross technique of the tapes and sutures.  On all, a good capsular reconstruction was achieved in order to allow for inferiorization of the humeral head.  Arm was taken through a range of motion, found to have good movement.  The CA ligament was not released. At this time, thorough irrigation was performed.  Deltoid split closed using 0 Vicryl suture, 3-0 Vicryl suture, and a 3-0 Monocryl. Arthroscopy portals closed using 3-0 nylon.  A Mepilex dressings and sling applied.  The patient tolerated the procedure well without immediate complication.     Anderson Malta, M.D.     GSD/MEDQ  D:  02/16/2015  T:  02/17/2015  Job:  [id number]

## 2015-02-25 ENCOUNTER — Encounter (HOSPITAL_COMMUNITY): Payer: Self-pay | Admitting: Orthopedic Surgery

## 2015-03-12 NOTE — Addendum Note (Signed)
Addendum  created 03/12/15 1017 by Roberts Gaudy, MD   Modules edited: Anesthesia Blocks and Procedures, Clinical Notes   Clinical Notes:  File: [id number]

## 2015-03-30 ENCOUNTER — Encounter: Payer: Self-pay | Admitting: Internal Medicine

## 2015-03-30 ENCOUNTER — Ambulatory Visit (INDEPENDENT_AMBULATORY_CARE_PROVIDER_SITE_OTHER): Payer: BLUE CROSS/BLUE SHIELD | Admitting: Internal Medicine

## 2015-03-30 VITALS — BP 118/74 | HR 70 | Temp 98.2°F | Resp 12 | Ht 73.0 in | Wt 212.5 lb

## 2015-03-30 DIAGNOSIS — F341 Dysthymic disorder: Secondary | ICD-10-CM | POA: Diagnosis not present

## 2015-03-30 DIAGNOSIS — E034 Atrophy of thyroid (acquired): Secondary | ICD-10-CM

## 2015-03-30 DIAGNOSIS — Z125 Encounter for screening for malignant neoplasm of prostate: Secondary | ICD-10-CM | POA: Diagnosis not present

## 2015-03-30 DIAGNOSIS — Z23 Encounter for immunization: Secondary | ICD-10-CM

## 2015-03-30 DIAGNOSIS — K760 Fatty (change of) liver, not elsewhere classified: Secondary | ICD-10-CM

## 2015-03-30 DIAGNOSIS — R232 Flushing: Secondary | ICD-10-CM | POA: Diagnosis not present

## 2015-03-30 DIAGNOSIS — M25561 Pain in right knee: Secondary | ICD-10-CM

## 2015-03-30 DIAGNOSIS — E669 Obesity, unspecified: Secondary | ICD-10-CM | POA: Diagnosis not present

## 2015-03-30 DIAGNOSIS — E119 Type 2 diabetes mellitus without complications: Secondary | ICD-10-CM

## 2015-03-30 DIAGNOSIS — E663 Overweight: Secondary | ICD-10-CM

## 2015-03-30 DIAGNOSIS — E785 Hyperlipidemia, unspecified: Secondary | ICD-10-CM

## 2015-03-30 DIAGNOSIS — E1169 Type 2 diabetes mellitus with other specified complication: Secondary | ICD-10-CM

## 2015-03-30 DIAGNOSIS — E038 Other specified hypothyroidism: Secondary | ICD-10-CM | POA: Diagnosis not present

## 2015-03-30 DIAGNOSIS — F411 Generalized anxiety disorder: Secondary | ICD-10-CM

## 2015-03-30 DIAGNOSIS — G8929 Other chronic pain: Secondary | ICD-10-CM

## 2015-03-30 DIAGNOSIS — L219 Seborrheic dermatitis, unspecified: Secondary | ICD-10-CM

## 2015-03-30 LAB — MICROALBUMIN / CREATININE URINE RATIO
Creatinine,U: 198 mg/dL
Microalb Creat Ratio: 0.4 mg/g (ref 0.0–30.0)
Microalb, Ur: 0.8 mg/dL (ref 0.0–1.9)

## 2015-03-30 LAB — CBC WITH DIFFERENTIAL/PLATELET
BASOS ABS: 0 10*3/uL (ref 0.0–0.1)
Basophils Relative: 0.2 % (ref 0.0–3.0)
EOS ABS: 0.2 10*3/uL (ref 0.0–0.7)
Eosinophils Relative: 3 % (ref 0.0–5.0)
HEMATOCRIT: 44.2 % (ref 39.0–52.0)
Hemoglobin: 14.8 g/dL (ref 13.0–17.0)
LYMPHS PCT: 17.5 % (ref 12.0–46.0)
Lymphs Abs: 1.3 10*3/uL (ref 0.7–4.0)
MCHC: 33.4 g/dL (ref 30.0–36.0)
MCV: 92.6 fl (ref 78.0–100.0)
MONOS PCT: 7.6 % (ref 3.0–12.0)
Monocytes Absolute: 0.6 10*3/uL (ref 0.1–1.0)
NEUTROS ABS: 5.4 10*3/uL (ref 1.4–7.7)
Neutrophils Relative %: 71.7 % (ref 43.0–77.0)
Platelets: 334 10*3/uL (ref 150.0–400.0)
RBC: 4.78 Mil/uL (ref 4.22–5.81)
RDW: 13 % (ref 11.5–15.5)
WBC: 7.6 10*3/uL (ref 4.0–10.5)

## 2015-03-30 LAB — LDL CHOLESTEROL, DIRECT: Direct LDL: 152 mg/dL

## 2015-03-30 LAB — COMPREHENSIVE METABOLIC PANEL
ALBUMIN: 4.2 g/dL (ref 3.5–5.2)
ALK PHOS: 113 U/L (ref 39–117)
ALT: 15 U/L (ref 0–53)
AST: 17 U/L (ref 0–37)
BILIRUBIN TOTAL: 0.4 mg/dL (ref 0.2–1.2)
BUN: 15 mg/dL (ref 6–23)
CALCIUM: 9.3 mg/dL (ref 8.4–10.5)
CO2: 31 mEq/L (ref 19–32)
Chloride: 99 mEq/L (ref 96–112)
Creatinine, Ser: 0.97 mg/dL (ref 0.40–1.50)
GFR: 86.93 mL/min (ref >60.00)
GLUCOSE: 90 mg/dL (ref 70–99)
POTASSIUM: 4.5 meq/L (ref 3.5–5.1)
Sodium: 138 mEq/L (ref 135–145)
TOTAL PROTEIN: 6.8 g/dL (ref 6.0–8.3)

## 2015-03-30 LAB — LIPID PANEL
CHOL/HDL RATIO: 6
Cholesterol: 213 mg/dL — ABNORMAL HIGH (ref 0–200)
HDL: 33.7 mg/dL — AB (ref >39.00)
NONHDL: 179.69
TRIGLYCERIDES: 306 mg/dL — AB (ref 0.0–149.0)
VLDL: 61.2 mg/dL — AB (ref 0.0–40.0)

## 2015-03-30 LAB — TSH: TSH: 2.3 u[IU]/mL (ref 0.35–4.50)

## 2015-03-30 LAB — HEMOGLOBIN A1C: Hgb A1c MFr Bld: 5.8 % (ref 4.6–6.5)

## 2015-03-30 LAB — PSA: PSA: 0.65 ng/mL (ref 0.10–4.00)

## 2015-03-30 MED ORDER — CLOBETASOL PROPIONATE 0.05 % EX LIQD: Freq: Two times a day (BID) | CUTANEOUS | Status: DC

## 2015-03-30 MED ORDER — TRAMADOL HCL 50 MG PO TABS: 50.0000 mg | ORAL_TABLET | Freq: Three times a day (TID) | ORAL | Status: DC | PRN

## 2015-03-30 MED ORDER — CLOBETASOL PROPIONATE 0.05 % EX SHAM: MEDICATED_SHAMPOO | CUTANEOUS | Status: DC

## 2015-03-30 MED ORDER — DICYCLOMINE HCL 20 MG PO TABS
20.0000 mg | ORAL_TABLET | Freq: Four times a day (QID) | ORAL | Status: DC
Start: 2015-03-30 — End: 2015-12-21

## 2015-03-30 MED ORDER — MELOXICAM 15 MG PO TABS: 15.0000 mg | ORAL_TABLET | Freq: Every day | ORAL | Status: DC

## 2015-03-30 NOTE — Progress Notes (Signed)
Subjective:  Patient ID: [name], male    DOB: [birth year]  Age: 49 y.o. MRN: [id number]  CC: The primary encounter diagnosis was Hyperlipidemia associated with type 2 diabetes mellitus ([place]). Diagnoses of ANXIETY DEPRESSION, Diabetes mellitus type 2 in obese [facility] Chronic knee pain, right, Seborrheic dermatitis, unspecified, Fatty liver, Flushing reaction, Prostate cancer screening, Hypothyroidism due to acquired atrophy of thyroid, Need for prophylactic vaccination against Streptococcus pneumoniae (pneumococcus), Generalized anxiety disorder, and Overweight were also pertinent to this visit.  HPI [name] presents for follow up on chronic issues . patient is reqquesting refill on Vicodin,  However he has recently had oxycodone 10 mg refilled on Nov 23,  prescribed by his orthopedic surgeon after his left shoulder surgery  By  Dr Marlou Sa.  He is using oxycodone before PT which is done 3 times weekly  And Has 12 left,.    .     He has been using Librax  without which he reports  having post prandial spasm and cramping,  Never tired  Dicyclomine.   Recurrence of sweating .  Last recurrence resulted in a testosterone replacement by endocrine   DM:  He has not been checking his blood sugars bd he does not have a working glucometer    Outpatient Prescriptions Prior to Visit  Medication Sig Dispense Refill  . aspirin EC 81 MG tablet Take 81 mg by mouth daily.    Marland Kitchen BAYER MICROLET LANCETS lancets Use as instructed 100 each 12  . ibuprofen (ADVIL,MOTRIN) 200 MG tablet Take 400 mg by mouth every 6 (six) hours as needed (pain).    Marland Kitchen levothyroxine (SYNTHROID, LEVOTHROID) 88 MCG tablet TAKE 1 TABLET BY MOUTH DAILY BEFORE BREAKFAST. 30 tablet 2  . Multiple Vitamin (MULTIVITAMIN WITH MINERALS) TABS tablet Take 1 tablet by mouth daily.    Vladimir Faster Glycol-Propyl Glycol (SYSTANE OP) Place 1 drop into both eyes daily as needed (burning/irritation).    . rosuvastatin (CRESTOR) 20 MG tablet Take  1 tablet (20 mg total) by mouth daily. 30 tablet 11  . ALPRAZolam (XANAX) 0.5 MG tablet TAKE 1 TABLET BY MOUTH AT BEDTIME AS NEEDED (Patient taking differently: Take 0.5 mg by mouth at bedtime. ) 30 tablet 5  . buPROPion (WELLBUTRIN SR) 150 MG 12 hr tablet TAKE 1 TABLET (150 MG TOTAL) BY MOUTH 2 (TWO) TIMES DAILY. 60 tablet 2  . citalopram (CELEXA) 20 MG tablet TAKE 1 TABLET BY MOUTH DAILY. 30 tablet 4  . clidinium-chlordiazePOXIDE (LIBRAX) 5-2.5 MG capsule Take 1 capsule by mouth 3 (three) times daily before meals. 90 capsule 0  . gabapentin (NEURONTIN) 100 MG capsule Take 100 mg by mouth 3 (three) times daily.  0  . lisinopril-hydrochlorothiazide (PRINZIDE,ZESTORETIC) 20-25 MG per tablet TAKE 1 TABLET BY MOUTH EVERY DAY 30 tablet 5  . methocarbamol (ROBAXIN) 500 MG tablet Take 1 tablet (500 mg total) by mouth every 8 (eight) hours as needed for muscle spasms. 30 tablet 0  . metoprolol tartrate (LOPRESSOR) 25 MG tablet TAKE 1 TABLET (25 MG TOTAL) BY MOUTH 2 (TWO) TIMES DAILY. 60 tablet 2  . omeprazole (PRILOSEC) 40 MG capsule TAKE ONE CAPSULE BY MOUTH TWICE A DAY 60 capsule 6   No facility-administered medications prior to visit.    Review of Systems;  Patient denies headache, fevers, malaise, unintentional weight loss, skin rash, eye pain, sinus congestion and sinus pain, sore throat, dysphagia,  hemoptysis , cough, dyspnea, wheezing, chest pain, palpitations, orthopnea, edema, abdominal pain, nausea, melena,  diarrhea, constipation, flank pain, dysuria, hematuria, urinary  Frequency, nocturia, numbness, tingling, seizures,  Focal weakness, Loss of consciousness,  Tremor, insomnia, depression, anxiety, and suicidal ideation.      Objective:  BP 118/74 mmHg  Pulse 70  Temp(Src) 98.2 F (36.8 C) (Oral)  Resp 12  Ht 6\' 1"  (1.854 m)  Wt 212 lb 8 oz (96.389 kg)  BMI 28.04 kg/m2  SpO2 95%  BP Readings from Last 3 Encounters:  03/30/15 118/74  02/16/15 146/85  11/03/14 130/70    Wt  Readings from Last 3 Encounters:  03/30/15 212 lb 8 oz (96.389 kg)  02/16/15 210 lb (95.255 kg)  11/03/14 219 lb 12.8 oz (99.701 kg)    General appearance: alert, cooperative and appears stated age Ears: normal TM's and external ear canals both ears Throat: lips, mucosa, and tongue normal; teeth and gums normal Neck: no adenopathy, no carotid bruit, supple, symmetrical, trachea midline and thyroid not enlarged, symmetric, no tenderness/mass/nodules Back: symmetric, no curvature. ROM normal. No CVA tenderness. Lungs: clear to auscultation bilaterally Heart: regular rate and rhythm, S1, S2 normal, no murmur, click, rub or gallop Abdomen: soft, non-tender; bowel sounds normal; no masses,  no organomegaly Pulses: 2+ and symmetric Skin: Skin color, texture, turgor normal. No rashes or lesions Lymph nodes: Cervical, supraclavicular, and axillary nodes normal.  Lab Results  Component Value Date   HGBA1C 5.8 03/30/2015   HGBA1C 5.9 11/03/2014   HGBA1C 5.3 08/04/2014    Lab Results  Component Value Date   CREATININE 0.97 03/30/2015   CREATININE 0.87 02/16/2015   CREATININE 0.98 11/03/2014    Lab Results  Component Value Date   WBC 7.6 03/30/2015   HGB 14.8 03/30/2015   HCT 44.2 03/30/2015   PLT 334.0 03/30/2015   GLUCOSE 90 03/30/2015   CHOL 213* 03/30/2015   TRIG 306.0* 03/30/2015   HDL 33.70* 03/30/2015   LDLDIRECT 152.0 03/30/2015   LDLCALC 144* 11/03/2014   ALT 15 03/30/2015   AST 17 03/30/2015   NA 138 03/30/2015   K 4.5 03/30/2015   CL 99 03/30/2015   CREATININE 0.97 03/30/2015   BUN 15 03/30/2015   CO2 31 03/30/2015   TSH 2.30 03/30/2015   PSA 0.65 03/30/2015   INR 1.62* 06/26/2014   HGBA1C 5.8 03/30/2015   MICROALBUR 0.8 03/30/2015    No results found.  Assessment & Plan:   Problem List Items Addressed This Visit    Hyperlipidemia associated with type 2 diabetes mellitus ([place]) - Primary    triglycerides have improved but are still > 300., with low HDL  and LDL > 100.  Recommending trial of crestor. .  . Lab Results  Component Value Date   CHOL 213* 03/30/2015   HDL 33.70* 03/30/2015   LDLCALC 144* 11/03/2014   LDLDIRECT 152.0 03/30/2015   TRIG 306.0* 03/30/2015   CHOLHDL 6 03/30/2015            Relevant Medications   lisinopril-hydrochlorothiazide (PRINZIDE,ZESTORETIC) 20-25 MG tablet   metoprolol tartrate (LOPRESSOR) 25 MG tablet   ANXIETY DEPRESSION    Manifested with IBS and insomnia.  Continue wellbutrin, celexa, alprazolam  qhs prn       Relevant Medications   buPROPion (WELLBUTRIN SR) 150 MG 12 hr tablet   citalopram (CELEXA) 20 MG tablet   Fatty liver     coonfirmed with liver biopsy done at [facility] Nov 2014.  He  is following a lower glycemic index, has improved glycemic control and lost  weight.  Liver  enzymes are now normal.   He has had Hep A and B vaccines   Lab Results  Component Value Date   ALT 15 03/30/2015   AST 17 03/30/2015   ALKPHOS 113 03/30/2015   BILITOT 0.4 03/30/2015                    Diabetes mellitus type 2 in obese ([facility])    Currently well-controlled on diet alone  .  hemoglobin A1c is  less than 6.0 .nd he is losing weight. Patient is reminded to schedule an annual eye exam and foot exam is normal today. Patient has no microalbuminuria. Patient is tolerating statin therapy for CAD risk reduction and on ACE/ARB for renal protection and hypertension.  Lab Results  Component Value Date   HGBA1C 5.8 03/30/2015   Lab Results  Component Value Date   MICROALBUR 0.8 03/30/2015   Lab Results  Component Value Date   ALT 15 03/30/2015   AST 17 03/30/2015   ALKPHOS 113 03/30/2015   BILITOT 0.4 03/30/2015              Relevant Medications   lisinopril-hydrochlorothiazide (PRINZIDE,ZESTORETIC) 20-25 MG tablet   Other Relevant Orders   Hemoglobin A1c (Completed)   LDL cholesterol, direct (Completed)   Lipid panel (Completed)   Microalbumin / creatinine urine ratio  (Completed)   Comprehensive metabolic panel (Completed)   Overweight    I have congratulated him in reduction of   BMI and encouraged  Continued weight loss with goal of 10% of body weigh over the next 6 months using a low glycemic index diet and regular exercise a minimum of 5 days per week.        Chronic knee pain    Discussed his need to wean off of narcotics due to dependence,  vicodin has not been refilled, since he is  currently taking oxycodone post shoulder surgery      Flushing reaction    Etiology unclear,  Previous endocrinology evaluation for same yielded diagnosis of low testosterone      Relevant Medications   lisinopril-hydrochlorothiazide (PRINZIDE,ZESTORETIC) 20-25 MG tablet   metoprolol tartrate (LOPRESSOR) 25 MG tablet   Other Relevant Orders   CBC with Differential/Platelet (Completed)   Testosterone, Free, Total, SHBG   Seborrheic dermatitis, unspecified    Other Visit Diagnoses    Prostate cancer screening        Relevant Orders    PSA (Completed)    Hypothyroidism due to acquired atrophy of thyroid        Relevant Medications    metoprolol tartrate (LOPRESSOR) 25 MG tablet    Other Relevant Orders    TSH (Completed)    Need for prophylactic vaccination against Streptococcus pneumoniae (pneumococcus)        Relevant Orders    Pneumococcal polysaccharide vaccine 23-valent greater than or equal to 2yo subcutaneous/IM (Completed)    Generalized anxiety disorder           I have discontinued [name]'s ALPRAZolam and clidinium-chlordiazePOXIDE. I have also changed his citalopram, gabapentin, lisinopril-hydrochlorothiazide, and omeprazole. Additionally, I am having him start on dicyclomine, Clobetasol Propionate, Clobetasol Propionate, traMADol, and meloxicam. Lastly, I am having him maintain his BAYER MICROLET LANCETS, rosuvastatin, multivitamin with minerals, ibuprofen, aspirin EC, Polyethyl Glycol-Propyl Glycol (SYSTANE OP), levothyroxine,  HYDROcodone-acetaminophen, Oxycodone HCl, buPROPion, methocarbamol, and metoprolol tartrate.  Meds ordered this encounter  Medications  . HYDROcodone-acetaminophen (NORCO) 10-325 MG tablet    Sig: Take 1 tablet by mouth every  6 (six) hours as needed.    Refill:  0  . Oxycodone HCl 10 MG TABS    Sig: Take 1 tablet by mouth 3 (three) times daily as needed. For severe pain    Refill:  0  . dicyclomine (BENTYL) 20 MG tablet    Sig: Take 1 tablet (20 mg total) by mouth every 6 (six) hours.    Dispense:  120 tablet    Refill:  5  . Clobetasol Propionate (TEMOVATE) 0.05 % external spray    Sig: Apply topically 2 (two) times daily.    Dispense:  125 mL    Refill:  5  . Clobetasol Propionate 0.05 % shampoo    Sig: Apply to dry scalp and leave on for 15 minutes    Dispense:  118 mL    Refill:  5  . traMADol (ULTRAM) 50 MG tablet    Sig: Take 1 tablet (50 mg total) by mouth every 8 (eight) hours as needed.    Dispense:  90 tablet    Refill:  2  . meloxicam (MOBIC) 15 MG tablet    Sig: Take 1 tablet (15 mg total) by mouth daily. For joint pain    Dispense:  30 tablet    Refill:  2  . DISCONTD: ALPRAZolam (XANAX) 0.5 MG tablet    Sig: TAKE 1 TABLET BY MOUTH AT BEDTIME AS NEEDED    Dispense:  30 tablet    Refill:  5  . buPROPion (WELLBUTRIN SR) 150 MG 12 hr tablet    Sig: TAKE 1 TABLET (150 MG TOTAL) BY MOUTH 2 (TWO) TIMES DAILY.    Dispense:  60 tablet    Refill:  2  . citalopram (CELEXA) 20 MG tablet    Sig: Take 1 tablet (20 mg total) by mouth daily.    Dispense:  30 tablet    Refill:  4  . gabapentin (NEURONTIN) 100 MG capsule    Sig: Take 1 capsule (100 mg total) by mouth 3 (three) times daily.    Dispense:  90 capsule    Refill:  4  . lisinopril-hydrochlorothiazide (PRINZIDE,ZESTORETIC) 20-25 MG tablet    Sig: Take 1 tablet by mouth daily.    Dispense:  30 tablet    Refill:  5  . methocarbamol (ROBAXIN) 500 MG tablet    Sig: Take 1 tablet (500 mg total) by mouth every 8  (eight) hours as needed for muscle spasms.    Dispense:  30 tablet    Refill:  0  . metoprolol tartrate (LOPRESSOR) 25 MG tablet    Sig: TAKE 1 TABLET (25 MG TOTAL) BY MOUTH 2 (TWO) TIMES DAILY.    Dispense:  60 tablet    Refill:  2  . omeprazole (PRILOSEC) 40 MG capsule    Sig: Take 1 capsule (40 mg total) by mouth 2 (two) times daily.    Dispense:  60 capsule    Refill:  6    Medications Discontinued During This Encounter  Medication Reason  . clidinium-chlordiazePOXIDE (LIBRAX) 5-2.5 MG capsule   . ALPRAZolam (XANAX) 0.5 MG tablet Reorder  . buPROPion (WELLBUTRIN SR) 150 MG 12 hr tablet Reorder  . citalopram (CELEXA) 20 MG tablet Reorder  . gabapentin (NEURONTIN) 100 MG capsule Reorder  . lisinopril-hydrochlorothiazide (PRINZIDE,ZESTORETIC) 20-25 MG per tablet Reorder  . methocarbamol (ROBAXIN) 500 MG tablet Reorder  . metoprolol tartrate (LOPRESSOR) 25 MG tablet Reorder  . omeprazole (PRILOSEC) 40 MG capsule Reorder    Follow-up: No Follow-up on  file.   [name], MD

## 2015-03-30 NOTE — Progress Notes (Signed)
Pre-visit discussion using our clinic review tool. No additional management support is needed unless otherwise documented below in the visit note.  

## 2015-03-30 NOTE — Patient Instructions (Addendum)
I want to work with you to reduce your narcotic dependence, now that your orthopedic issues are being corrected.  I will not be able to refill the hydrocodone currently because you are using oxycodone, so I want to take this opportunity to transition you off of Vicodin and onto tramadol , meloxicam,  And Robaxin  I will send tramadol and meloxicam to your pharmacy.  You cna use the tramadol up to 3 times daily if needed,  I have also substituted Bentyl for Librax,  Because it is not habit forming (Librax is a controlled substance and a lot like valium).  YOu can take it 30 minutes prior to all meals (4 daily) to reduce the cramping

## 2015-03-31 ENCOUNTER — Other Ambulatory Visit: Payer: Self-pay | Admitting: *Deleted

## 2015-03-31 DIAGNOSIS — F411 Generalized anxiety disorder: Secondary | ICD-10-CM

## 2015-03-31 MED ORDER — ALPRAZOLAM 0.5 MG PO TABS: ORAL_TABLET | ORAL | Status: DC

## 2015-03-31 MED ORDER — BUPROPION HCL ER (SR) 150 MG PO TB12: ORAL_TABLET | ORAL | Status: DC

## 2015-03-31 MED ORDER — GABAPENTIN 100 MG PO CAPS: 100.0000 mg | ORAL_CAPSULE | Freq: Three times a day (TID) | ORAL | Status: DC

## 2015-03-31 MED ORDER — OMEPRAZOLE 40 MG PO CPDR: 40.0000 mg | DELAYED_RELEASE_CAPSULE | Freq: Two times a day (BID) | ORAL | Status: DC

## 2015-03-31 MED ORDER — METOPROLOL TARTRATE 25 MG PO TABS: ORAL_TABLET | ORAL | Status: DC

## 2015-03-31 MED ORDER — METHOCARBAMOL 500 MG PO TABS: 500.0000 mg | ORAL_TABLET | Freq: Three times a day (TID) | ORAL | Status: DC | PRN

## 2015-03-31 MED ORDER — LISINOPRIL-HYDROCHLOROTHIAZIDE 20-25 MG PO TABS: 1.0000 | ORAL_TABLET | Freq: Every day | ORAL | Status: DC

## 2015-03-31 MED ORDER — CITALOPRAM HYDROBROMIDE 20 MG PO TABS: 20.0000 mg | ORAL_TABLET | Freq: Every day | ORAL | Status: DC

## 2015-03-31 NOTE — Assessment & Plan Note (Addendum)
Currently well-controlled on diet alone  .  hemoglobin A1c is  less than 6.0 .nd he is losing weight. Patient is reminded to schedule an annual eye exam and foot exam is normal today. Patient has no microalbuminuria. Patient is tolerating statin therapy for CAD risk reduction and on ACE/ARB for renal protection and hypertension. Glucometer given.   Lab Results  Component Value Date   HGBA1C 5.8 03/30/2015   Lab Results  Component Value Date   MICROALBUR 0.8 03/30/2015   Lab Results  Component Value Date   ALT 15 03/30/2015   AST 17 03/30/2015   ALKPHOS 113 03/30/2015   BILITOT 0.4 03/30/2015

## 2015-03-31 NOTE — Assessment & Plan Note (Addendum)
coonfirmed with liver biopsy done at [facility] Nov 2014.  He  is following a lower glycemic index, has improved glycemic control and lost  weight.  Liver enzymes are now normal.   He has had Hep A and B vaccines   Lab Results  Component Value Date   ALT 15 03/30/2015   AST 17 03/30/2015   ALKPHOS 113 03/30/2015   BILITOT 0.4 03/30/2015

## 2015-03-31 NOTE — Assessment & Plan Note (Signed)
Etiology unclear,  Previous endocrinology evaluation for same yielded diagnosis of low testosterone

## 2015-03-31 NOTE — Assessment & Plan Note (Signed)
triglycerides have improved but are still > 300., with low HDL and LDL > 100.  Recommending trial of crestor. .  . Lab Results  Component Value Date   CHOL 213* 03/30/2015   HDL 33.70* 03/30/2015   LDLCALC 144* 11/03/2014   LDLDIRECT 152.0 03/30/2015   TRIG 306.0* 03/30/2015   CHOLHDL 6 03/30/2015

## 2015-03-31 NOTE — Assessment & Plan Note (Signed)
Manifested with IBS and insomnia.  Continue wellbutrin, celexa, alprazolam  qhs prn

## 2015-03-31 NOTE — Assessment & Plan Note (Signed)
Discussed his need to wean off of narcotics due to dependence,  vicodin has not been refilled, since he is  currently taking oxycodone post shoulder surgery

## 2015-03-31 NOTE — Assessment & Plan Note (Signed)
I have congratulated him in reduction of   BMI and encouraged  Continued weight loss with goal of 10% of body weigh over the next 6 months using a low glycemic index diet and regular exercise a minimum of 5 days per week.   

## 2015-04-01 LAB — TESTOSTERONE, FREE, TOTAL, SHBG
SEX HORMONE BINDING: 15 nmol/L (ref 10–50)
TESTOSTERONE FREE: 68.3 pg/mL (ref 47.0–244.0)
TESTOSTERONE-% FREE: 2.8 % (ref 1.6–2.9)
Testosterone: 243 ng/dL — ABNORMAL LOW (ref 300–890)

## 2015-04-02 ENCOUNTER — Encounter: Payer: Self-pay | Admitting: Internal Medicine

## 2015-04-06 MED ORDER — ROSUVASTATIN CALCIUM 20 MG PO TABS: 20.0000 mg | ORAL_TABLET | Freq: Every day | ORAL | Status: DC

## 2015-05-15 ENCOUNTER — Other Ambulatory Visit: Payer: Self-pay | Admitting: Internal Medicine

## 2015-05-16 ENCOUNTER — Other Ambulatory Visit: Payer: Self-pay | Admitting: Internal Medicine

## 2015-06-20 ENCOUNTER — Other Ambulatory Visit: Payer: Self-pay | Admitting: Internal Medicine

## 2015-06-21 ENCOUNTER — Other Ambulatory Visit: Payer: Self-pay | Admitting: Internal Medicine

## 2015-06-21 NOTE — Telephone Encounter (Signed)
refilled 

## 2015-06-21 NOTE — Telephone Encounter (Signed)
Ok to refill Robaxin? °

## 2015-08-08 ENCOUNTER — Other Ambulatory Visit: Payer: Self-pay | Admitting: Internal Medicine

## 2015-08-26 LAB — BASIC METABOLIC PANEL
BUN: 23 mg/dL — AB (ref 4–21)
CREATININE: 1.3 mg/dL (ref 0.6–1.3)
GLUCOSE: 95 mg/dL
POTASSIUM: 3.8 mmol/L (ref 3.4–5.3)
Sodium: 139 mmol/L (ref 137–147)

## 2015-08-26 LAB — HEPATIC FUNCTION PANEL
ALT: 23 U/L (ref 10–40)
AST: 26 U/L (ref 14–40)
Alkaline Phosphatase: 83 U/L (ref 25–125)

## 2015-08-27 ENCOUNTER — Encounter (HOSPITAL_COMMUNITY)
Admission: RE | Disposition: A | Discharge status: Home / Self Care | Payer: Self-pay | Source: Ambulatory Visit | Attending: Internal Medicine

## 2015-08-27 ENCOUNTER — Other Ambulatory Visit: Payer: Self-pay | Admitting: Internal Medicine

## 2015-08-27 ENCOUNTER — Ambulatory Visit (HOSPITAL_COMMUNITY)
Admission: RE | Admit: 2015-08-27 | Discharge: 2015-08-27 | Disposition: A | Discharge status: Home / Self Care | Payer: BLUE CROSS/BLUE SHIELD | Source: Ambulatory Visit | Attending: Internal Medicine | Admitting: Internal Medicine

## 2015-08-27 ENCOUNTER — Telehealth (HOSPITAL_COMMUNITY): Payer: Self-pay | Admitting: *Deleted

## 2015-08-27 ENCOUNTER — Other Ambulatory Visit (HOSPITAL_COMMUNITY): Payer: Self-pay | Admitting: *Deleted

## 2015-08-27 DIAGNOSIS — I10 Essential (primary) hypertension: Secondary | ICD-10-CM | POA: Insufficient documentation

## 2015-08-27 DIAGNOSIS — E049 Nontoxic goiter, unspecified: Secondary | ICD-10-CM | POA: Insufficient documentation

## 2015-08-27 DIAGNOSIS — G4730 Sleep apnea, unspecified: Secondary | ICD-10-CM | POA: Insufficient documentation

## 2015-08-27 DIAGNOSIS — E119 Type 2 diabetes mellitus without complications: Secondary | ICD-10-CM | POA: Insufficient documentation

## 2015-08-27 DIAGNOSIS — Z7982 Long term (current) use of aspirin: Secondary | ICD-10-CM | POA: Insufficient documentation

## 2015-08-27 DIAGNOSIS — E039 Hypothyroidism, unspecified: Secondary | ICD-10-CM | POA: Diagnosis not present

## 2015-08-27 DIAGNOSIS — F419 Anxiety disorder, unspecified: Secondary | ICD-10-CM | POA: Insufficient documentation

## 2015-08-27 DIAGNOSIS — N521 Erectile dysfunction due to diseases classified elsewhere: Secondary | ICD-10-CM | POA: Diagnosis not present

## 2015-08-27 DIAGNOSIS — K760 Fatty (change of) liver, not elsewhere classified: Secondary | ICD-10-CM | POA: Diagnosis not present

## 2015-08-27 DIAGNOSIS — R011 Cardiac murmur, unspecified: Secondary | ICD-10-CM | POA: Insufficient documentation

## 2015-08-27 DIAGNOSIS — K219 Gastro-esophageal reflux disease without esophagitis: Secondary | ICD-10-CM | POA: Insufficient documentation

## 2015-08-27 DIAGNOSIS — R072 Precordial pain: Secondary | ICD-10-CM | POA: Diagnosis not present

## 2015-08-27 DIAGNOSIS — I2510 Atherosclerotic heart disease of native coronary artery without angina pectoris: Secondary | ICD-10-CM | POA: Insufficient documentation

## 2015-08-27 DIAGNOSIS — R079 Chest pain, unspecified: Secondary | ICD-10-CM | POA: Insufficient documentation

## 2015-08-27 DIAGNOSIS — E785 Hyperlipidemia, unspecified: Secondary | ICD-10-CM | POA: Diagnosis not present

## 2015-08-27 DIAGNOSIS — F329 Major depressive disorder, single episode, unspecified: Secondary | ICD-10-CM | POA: Insufficient documentation

## 2015-08-27 DIAGNOSIS — Z87891 Personal history of nicotine dependence: Secondary | ICD-10-CM | POA: Diagnosis not present

## 2015-08-27 HISTORY — PX: CARDIAC CATHETERIZATION: SHX172

## 2015-08-27 LAB — BASIC METABOLIC PANEL
Anion gap: 9 (ref 5–15)
BUN: 21 mg/dL — AB (ref 6–20)
CHLORIDE: 102 mmol/L (ref 101–111)
CO2: 26 mmol/L (ref 22–32)
CREATININE: 1.01 mg/dL (ref 0.61–1.24)
Calcium: 9.3 mg/dL (ref 8.9–10.3)
Glucose, Bld: 99 mg/dL (ref 65–99)
Potassium: 4 mmol/L (ref 3.5–5.1)
SODIUM: 137 mmol/L (ref 135–145)

## 2015-08-27 LAB — CBC
HCT: 41.1 % (ref 39.0–52.0)
HEMOGLOBIN: 14 g/dL (ref 13.0–17.0)
MCH: 31.5 pg (ref 26.0–34.0)
MCHC: 34.1 g/dL (ref 30.0–36.0)
MCV: 92.4 fL (ref 78.0–100.0)
PLATELETS: 272 10*3/uL (ref 150–400)
RBC: 4.45 MIL/uL (ref 4.22–5.81)
RDW: 12.1 % (ref 11.5–15.5)
WBC: 4.9 10*3/uL (ref 4.0–10.5)

## 2015-08-27 LAB — PROTIME-INR
INR: 0.98 (ref 0.00–1.49)
Prothrombin Time: 13.2 seconds (ref 11.6–15.2)

## 2015-08-27 SURGERY — LEFT HEART CATH AND CORONARY ANGIOGRAPHY

## 2015-08-27 MED ORDER — IOPAMIDOL (ISOVUE-370) INJECTION 76%
INTRAVENOUS | Status: DC | PRN
  Administered 2015-08-27: 40 mL via INTRA_ARTERIAL

## 2015-08-27 MED ORDER — SODIUM CHLORIDE 0.9 % IV SOLN: 250.0000 mL | INTRAVENOUS | Status: DC | PRN

## 2015-08-27 MED ORDER — LIDOCAINE HCL (PF) 1 % IJ SOLN
INTRAMUSCULAR | Status: AC
  Filled 2015-08-27: qty 30

## 2015-08-27 MED ORDER — HEPARIN (PORCINE) IN NACL 2-0.9 UNIT/ML-% IJ SOLN
INTRAMUSCULAR | Status: DC | PRN
  Administered 2015-08-27: 1500 mL

## 2015-08-27 MED ORDER — FENTANYL CITRATE (PF) 100 MCG/2ML IJ SOLN
INTRAMUSCULAR | Status: AC
  Filled 2015-08-27: qty 2

## 2015-08-27 MED ORDER — MIDAZOLAM HCL 2 MG/2ML IJ SOLN
INTRAMUSCULAR | Status: AC
  Filled 2015-08-27: qty 2

## 2015-08-27 MED ORDER — SODIUM CHLORIDE 0.9% FLUSH: 3.0000 mL | INTRAVENOUS | Status: DC | PRN

## 2015-08-27 MED ORDER — DIAZEPAM 5 MG PO TABS
5.0000 mg | ORAL_TABLET | ORAL | Status: AC
  Administered 2015-08-27: 5 mg via ORAL
  Filled 2015-08-27: qty 1

## 2015-08-27 MED ORDER — SODIUM CHLORIDE 0.9 % IV SOLN
INTRAVENOUS | Status: DC
  Administered 2015-08-27: 14:00:00 via INTRAVENOUS

## 2015-08-27 MED ORDER — IOPAMIDOL (ISOVUE-370) INJECTION 76%
INTRAVENOUS | Status: AC
  Filled 2015-08-27: qty 100

## 2015-08-27 MED ORDER — MIDAZOLAM HCL 2 MG/2ML IJ SOLN
INTRAMUSCULAR | Status: DC | PRN
  Administered 2015-08-27: 2 mg via INTRAVENOUS
  Administered 2015-08-27: 1 mg via INTRAVENOUS

## 2015-08-27 MED ORDER — FENTANYL CITRATE (PF) 100 MCG/2ML IJ SOLN
INTRAMUSCULAR | Status: DC | PRN
  Administered 2015-08-27: 50 ug via INTRAVENOUS

## 2015-08-27 MED ORDER — SODIUM CHLORIDE 0.9 % IV SOLN: INTRAVENOUS | Status: AC

## 2015-08-27 MED ORDER — SODIUM CHLORIDE 0.9% FLUSH: 3.0000 mL | Freq: Two times a day (BID) | INTRAVENOUS | Status: DC

## 2015-08-27 MED ORDER — HEPARIN SODIUM (PORCINE) 1000 UNIT/ML IJ SOLN
INTRAMUSCULAR | Status: DC | PRN
  Administered 2015-08-27: 4500 [IU] via INTRAVENOUS

## 2015-08-27 MED ORDER — ONDANSETRON HCL 4 MG/2ML IJ SOLN: 4.0000 mg | Freq: Four times a day (QID) | INTRAMUSCULAR | Status: DC | PRN

## 2015-08-27 MED ORDER — HEPARIN (PORCINE) IN NACL 2-0.9 UNIT/ML-% IJ SOLN
INTRAMUSCULAR | Status: AC
  Filled 2015-08-27: qty 1000

## 2015-08-27 MED ORDER — VERAPAMIL HCL 2.5 MG/ML IV SOLN
INTRAVENOUS | Status: AC
  Filled 2015-08-27: qty 2

## 2015-08-27 MED ORDER — LIDOCAINE HCL (PF) 1 % IJ SOLN
INTRAMUSCULAR | Status: DC | PRN
  Administered 2015-08-27: 2 mL via INTRADERMAL

## 2015-08-27 MED ORDER — HEPARIN (PORCINE) IN NACL 2-0.9 UNIT/ML-% IJ SOLN
INTRAMUSCULAR | Status: AC
  Filled 2015-08-27: qty 500

## 2015-08-27 MED ORDER — ACETAMINOPHEN 325 MG PO TABS
650.0000 mg | ORAL_TABLET | ORAL | Status: DC | PRN
  Administered 2015-08-27: 650 mg via ORAL

## 2015-08-27 MED ORDER — ACETAMINOPHEN 325 MG PO TABS
ORAL_TABLET | ORAL | Status: AC
  Filled 2015-08-27: qty 2

## 2015-08-27 MED ORDER — ASPIRIN 81 MG PO CHEW: 81.0000 mg | CHEWABLE_TABLET | ORAL | Status: DC

## 2015-08-27 MED ORDER — DIAZEPAM 5 MG PO TABS
ORAL_TABLET | ORAL | Status: AC
  Filled 2015-08-27: qty 1

## 2015-08-27 MED ORDER — VERAPAMIL HCL 2.5 MG/ML IV SOLN
INTRAVENOUS | Status: DC | PRN
  Administered 2015-08-27: 10 mL via INTRA_ARTERIAL

## 2015-08-27 SURGICAL SUPPLY — 10 items
CATH INFINITI 5 FR JL3.5 (CATHETERS) ×2 IMPLANT
CATH INFINITI 5FR ANG PIGTAIL (CATHETERS) ×2 IMPLANT
CATH INFINITI JR4 5F (CATHETERS) ×2 IMPLANT
DEVICE RAD COMP TR BAND LRG (VASCULAR PRODUCTS) ×2 IMPLANT
GLIDESHEATH SLEND SS 6F .021 (SHEATH) ×2 IMPLANT
KIT HEART LEFT (KITS) ×2 IMPLANT
PACK CARDIAC CATHETERIZATION (CUSTOM PROCEDURE TRAY) ×2 IMPLANT
TRANSDUCER W/STOPCOCK (MISCELLANEOUS) ×2 IMPLANT
TUBING CIL FLEX 10 FLL-RA (TUBING) ×2 IMPLANT
WIRE SAFE-T 1.5MM-J .035X260CM (WIRE) ×2 IMPLANT

## 2015-08-27 NOTE — Progress Notes (Signed)
C/o HA 7/10 pt took motrin this am without relief. Dr Jeffie Pollock was paged x 2, no return call, Amy Clegg PA was paged orders followed.

## 2015-08-27 NOTE — Telephone Encounter (Signed)
Per Dr Haroldine Laws pt seen in [facility] last night and needs LHC today for chest pain.  Pt sch for 3 pm, orders in pt aware

## 2015-08-27 NOTE — H&P (Signed)
Referring Physician: [facility]. [name]  Reason for Consultation: CP   HPI:  49 y/o male with HTN, HL, DM and anxiety.  No known CAD. Yesterday develop several episodes of exertional CP with diaphoresis. Went to [facility] ER. BP very elevated 180/110. ECG non-scute. Trop negative x 2. Unable to do coronary CT so referred for follow-up here.   Has intermittent episodes of CP. + DOE.    Review of Systems:     Cardiac Review of Systems: {Y] = yes [ ]  = no  Chest Pain Joshua.[name]    ]  Resting SOB [   ] Exertional SOB  Joshua.[name]  ]  [name] [  ]   Pedal Edema [   ]    Palpitations [  ] Syncope  [  ]   Presyncope [   ]  General Review of Systems: [Y] = yes [  ]=no Constitional: recent weight change [  ]; anorexia [  ]; fatigue [  ]; nausea [  ]; night sweats [  ]; fever [  ]; or chills [  ];                                                                     Eyes : blurred vision [  ]; diplopia [   ]; vision changes [  ];  Amaurosis fugax[  ]; Resp: cough [  ];  wheezing[  ];  hemoptysis[  ];  PND [  ];  GI:  gallstones[  ], vomiting[  ];  dysphagia[  ]; melena[  ];  hematochezia [  ]; heartburn[  ];   GU: kidney stones [  ]; hematuria[  ];   dysuria [  ];  nocturia[  ]; incontinence [  ];             Skin: rash, swelling[  ];, hair loss[  ];  peripheral edema[  ];  or itching[  ]; Musculosketetal: myalgias[  ];  joint swelling[y  ];  joint erythema[  ];  joint pain[ y ];  back pain[  ];  Heme/Lymph: bruising[  ];  bleeding[  ];  anemia[  ];  Neuro: TIA[  ];  headaches[  ];  stroke[  ];  vertigo[  ];  seizures[  ];   paresthesias[  ];  difficulty walking[  ];  Psych:depression[  ]; anxiety[ y ];  Endocrine: diabetes[  y];  thyroid dysfunction[  ];  Other:  Past Medical History  Diagnosis Date  . Hypertension     essential, benign  . Anxiety and depression   . Chest pain     hx  . HLD (hyperlipidemia)   . Palpitation     hx  . Other chest pain     tightness, pressure  . Precordial pain     . Bruit     L  . Impotence of organic origin   . Edema   . Goiter   . Anxiety   . Sleep apnea     uses cpap  . Diabetes mellitus without complication ([place])     "borderline"  . Hypothyroidism   . GERD (gastroesophageal reflux disease)     uses Omeprazole  . Fatty liver   . Murmur  Medications Prior to Admission  Medication Sig Dispense Refill  . ALPRAZolam (XANAX) 0.5 MG tablet TAKE 1 TABLET BY MOUTH AT BEDTIME AS NEEDED 30 tablet 5  . aspirin EC 81 MG tablet Take 81 mg by mouth daily.    Marland Kitchen BAYER MICROLET LANCETS lancets Use as instructed 100 each 12  . buPROPion (WELLBUTRIN SR) 150 MG 12 hr tablet TAKE 1 TABLET (150 MG TOTAL) BY MOUTH 2 (TWO) TIMES DAILY. 60 tablet 0  . citalopram (CELEXA) 20 MG tablet Take 1 tablet (20 mg total) by mouth daily. 30 tablet 4  . Clobetasol Propionate (TEMOVATE) 0.05 % external spray Apply topically 2 (two) times daily. 125 mL 5  . Clobetasol Propionate 0.05 % shampoo Apply to dry scalp and leave on for 15 minutes 118 mL 5  . dicyclomine (BENTYL) 20 MG tablet Take 1 tablet (20 mg total) by mouth every 6 (six) hours. 120 tablet 5  . gabapentin (NEURONTIN) 100 MG capsule Take 1 capsule (100 mg total) by mouth 3 (three) times daily. 90 capsule 4  . HYDROcodone-acetaminophen (NORCO) 10-325 MG tablet Take 1 tablet by mouth every 6 (six) hours as needed.  0  . ibuprofen (ADVIL,MOTRIN) 200 MG tablet Take 400 mg by mouth every 6 (six) hours as needed (pain).    Marland Kitchen levothyroxine (SYNTHROID, LEVOTHROID) 88 MCG tablet TAKE 1 TABLET BY MOUTH DAILY BEFORE BREAKFAST. 30 tablet 5  . levothyroxine (SYNTHROID, LEVOTHROID) 88 MCG tablet TAKE 1 TABLET BY MOUTH DAILY BEFORE BREAKFAST. 30 tablet 2  . lisinopril-hydrochlorothiazide (PRINZIDE,ZESTORETIC) 20-25 MG tablet Take 1 tablet by mouth daily. 30 tablet 5  . meloxicam (MOBIC) 15 MG tablet Take 1 tablet (15 mg total) by mouth daily. For joint pain 30 tablet 2  . methocarbamol (ROBAXIN) 500 MG tablet Take 1 tablet  (500 mg total) by mouth every 8 (eight) hours as needed for muscle spasms. 30 tablet 0  . methocarbamol (ROBAXIN) 500 MG tablet TAKE 1 TABLET BY MOUTH EVERY 8 HOURS AS NEEDED 90 tablet 0  . methocarbamol (ROBAXIN) 500 MG tablet TAKE 1 TABLET BY MOUTH EVERY 8 HOURS AS NEEDED 90 tablet 2  . metoprolol tartrate (LOPRESSOR) 25 MG tablet TAKE 1 TABLET (25 MG TOTAL) BY MOUTH 2 (TWO) TIMES DAILY. 60 tablet 0  . Multiple Vitamin (MULTIVITAMIN WITH MINERALS) TABS tablet Take 1 tablet by mouth daily.    Marland Kitchen omeprazole (PRILOSEC) 40 MG capsule Take 1 capsule (40 mg total) by mouth 2 (two) times daily. 60 capsule 6  . Oxycodone HCl 10 MG TABS Take 1 tablet by mouth 3 (three) times daily as needed. For severe pain  0  . Polyethyl Glycol-Propyl Glycol (SYSTANE OP) Place 1 drop into both eyes daily as needed (burning/irritation).    . rosuvastatin (CRESTOR) 20 MG tablet Take 1 tablet (20 mg total) by mouth daily. 90 tablet 2  . traMADol (ULTRAM) 50 MG tablet Take 1 tablet (50 mg total) by mouth every 8 (eight) hours as needed. 90 tablet 2       Infusions:   No Known Allergies  Social History   Social History  . Marital Status: Married    Spouse Name: N/A  . Number of Children: 2  . Years of Education: N/A   Occupational History  . Maintenance supervisor   . SERVICE DIRECTOR    Social History Main Topics  . Smoking status: Former Smoker -- 2.00 packs/day for 18 years    Types: Cigarettes    Quit date: 04/24/2005  .  Smokeless tobacco: Never Used     Comment: quit 20 years ago   . Alcohol Use: 10.2 oz/week    3 Shots of liquor, 14 Standard drinks or equivalent per week     Comment: 1 a day  . Drug Use: No  . Sexual Activity: Yes   Other Topics Concern  . Not on file   Social History Narrative   Married, gets regular exercise.     Family History  Problem Relation Age of Onset  . Heart disease Father     PHYSICAL EXAM: There were no vitals filed for this visit.  No intake or  output data in the 24 hours ending 08/27/15 1323  General:  Well appearing. No respiratory difficulty HEENT: normal Neck: supple. no JVD. Carotids 2+ bilat; no bruits. No lymphadenopathy or thryomegaly appreciated. Cor: PMI nondisplaced. Regular rate & rhythm. No rubs, gallops or murmurs. Lungs: clear Abdomen: soft, nontender, nondistended. No hepatosplenomegaly. No bruits or masses. Good bowel sounds. Extremities: no cyanosis, clubbing, rash, edema Neuro: alert & oriented x 3, cranial nerves grossly intact. moves all 4 extremities w/o difficulty. Affect pleasant.  ECG: pending  No results found for this or any previous visit (from the past 24 hour(s)). No results found.   ASSESSMENT: 1. Chest pain concerning for unstable angina 2. Severe HTN 3. HL 4. DM2 5. Anxiety  PLAN/DISCUSSION:  Given risk factors and nature of symptoms. Will proceed with cardiac cath to further evaluate.   I have reviewed the risks, indications, and alternatives to angioplasty and stenting with the patient. Risks include but are not limited to bleeding, infection, vascular injury, stroke, myocardial infection, arrhythmia, kidney injury, radiation-related injury in the case of prolonged fluoroscopy use, emergency cardiac surgery, and death. The patient understands the risks of serious complication is low (123456) and he agrees to proceed.   Bensimhon, Daniel,MD 1:27 PM

## 2015-08-27 NOTE — Interval H&P Note (Signed)
History and Physical Interval Note:  08/27/2015 3:33 PM  Sunday Corn  has presented today for surgery, with the diagnosis of cp  The various methods of treatment have been discussed with the patient and family. After consideration of risks, benefits and other options for treatment, the patient has consented to  Procedure(s): Left Heart Cath and Coronary Angiography (N/A) and possible angioplasty as a surgical intervention .  The patient's history has been reviewed, patient examined, no change in status, stable for surgery.  I have reviewed the patient's chart and labs.  Questions were answered to the patient's satisfaction.     Reyli Schroth, Quillian Quince

## 2015-08-27 NOTE — Discharge Instructions (Signed)
Radial Site Care Refer to this sheet in the next few weeks. These instructions provide you with information about caring for yourself after your procedure. Your health care provider may also give you more specific instructions. Your treatment has been planned according to current medical practices, but problems sometimes occur. Call your health care provider if you have any problems or questions after your procedure. WHAT TO EXPECT AFTER THE PROCEDURE After your procedure, it is typical to have the following:  Bruising at the radial site that usually fades within 1-2 weeks.  Blood collecting in the tissue (hematoma) that may be painful to the touch. It should usually decrease in size and tenderness within 1-2 weeks. HOME CARE INSTRUCTIONS DO NOT DRIVE TILL Sunday [id number]  Take medicines only as directed by your health care provider.  You may shower 24-48 hours after the procedure or as directed by your health care provider. Remove the bandage (dressing) and gently wash the site with plain soap and water. Pat the area dry with a clean towel. Do not rub the site, because this may cause bleeding.  Do not take baths, swim, or use a hot tub until your health care provider approves.  Check your insertion site every day for redness, swelling, or drainage.  Do not apply powder or lotion to the site.  Do not flex or bend the affected arm for 24 hours or as directed by your health care provider.  Do not push or pull heavy objects with the affected arm for 24 hours or as directed by your health care provider.  Do not lift over 10 lb (4.5 kg) for 5 days after your procedure or as directed by your health care provider.  Ask your health care provider when it is okay to:  Return to work or school.  Resume usual physical activities or sports.  Resume sexual activity.  Do not drive home if you are discharged the same day as the procedure. Have someone else drive you.  You may drive 24 hours after  the procedure unless otherwise instructed by your health care provider.  Do not operate machinery or power tools for 24 hours after the procedure.  If your procedure was done as an outpatient procedure, which means that you went home the same day as your procedure, a responsible adult should be with you for the first 24 hours after you arrive home.  Keep all follow-up visits as directed by your health care provider. This is important. SEEK MEDICAL CARE IF:  You have a fever.  You have chills.  You have increased bleeding from the radial site. Hold pressure on the site. CALL 911 SEEK IMMEDIATE MEDICAL CARE IF:  You have unusual pain at the radial site.  You have redness, warmth, or swelling at the radial site.  You have drainage (other than a small amount of blood on the dressing) from the radial site.  The radial site is bleeding, and the bleeding does not stop after 30 minutes of holding steady pressure on the site.  Your arm or hand becomes pale, cool, tingly, or numb.   This information is not intended to replace advice given to you by your health care provider. Make sure you discuss any questions you have with your health care provider.   Document Released: 05/13/2010 Document Revised: 05/01/2014 Document Reviewed: 10/27/2013 Elsevier Interactive Patient Education [employer].

## 2015-08-30 ENCOUNTER — Encounter (HOSPITAL_COMMUNITY): Payer: Self-pay | Admitting: Internal Medicine

## 2015-08-30 NOTE — Research (Signed)
CADLAD Informed Consent   Subject Name: ESEQUIEL KLEINFELTER  Subject met inclusion and exclusion criteria.  The informed consent form, study requirements and expectations were reviewed with the subject and questions and concerns were addressed prior to the signing of the consent form.  The subject verbalized understanding of the trail requirements.  The subject agreed to participate in the CADLAD trial and signed the informed consent.  The informed consent was obtained prior to performance of any protocol-specific procedures for the subject.  A copy of the signed informed consent was given to the subject and a copy was placed in the subject's medical record.  Sandie Ano 08/27/15 2:35 pm

## 2015-09-01 ENCOUNTER — Other Ambulatory Visit: Payer: Self-pay | Admitting: Internal Medicine

## 2015-09-02 NOTE — Telephone Encounter (Signed)
Last OV was in 12/16, please advise a refill, thanks

## 2015-09-03 NOTE — Telephone Encounter (Signed)
Refill for 30 days only.  OFFICE VISIT NEEDED prior to any more refills 

## 2015-09-06 ENCOUNTER — Other Ambulatory Visit: Payer: Self-pay | Admitting: Internal Medicine

## 2015-09-06 DIAGNOSIS — E785 Hyperlipidemia, unspecified: Secondary | ICD-10-CM

## 2015-09-06 DIAGNOSIS — E669 Obesity, unspecified: Secondary | ICD-10-CM

## 2015-09-06 DIAGNOSIS — E1169 Type 2 diabetes mellitus with other specified complication: Secondary | ICD-10-CM

## 2015-09-06 DIAGNOSIS — E034 Atrophy of thyroid (acquired): Secondary | ICD-10-CM

## 2015-09-06 DIAGNOSIS — K760 Fatty (change of) liver, not elsewhere classified: Secondary | ICD-10-CM

## 2015-09-07 NOTE — Telephone Encounter (Signed)
Patient was advised needs appointment for further refills Left message needs to call and schedule appointment and sent [facility] chart message. I scheduled patient for Monday at 4.30 awaiting patient to approve appointment time and date. Ok to fill for 30 days.

## 2015-09-13 ENCOUNTER — Encounter: Payer: Self-pay | Admitting: Internal Medicine

## 2015-09-13 ENCOUNTER — Ambulatory Visit (INDEPENDENT_AMBULATORY_CARE_PROVIDER_SITE_OTHER): Payer: BLUE CROSS/BLUE SHIELD | Admitting: Internal Medicine

## 2015-09-13 VITALS — BP 160/84 | HR 72 | Temp 98.1°F | Resp 12 | Ht 73.0 in | Wt 216.2 lb

## 2015-09-13 DIAGNOSIS — F411 Generalized anxiety disorder: Secondary | ICD-10-CM

## 2015-09-13 DIAGNOSIS — E663 Overweight: Secondary | ICD-10-CM

## 2015-09-13 DIAGNOSIS — M75122 Complete rotator cuff tear or rupture of left shoulder, not specified as traumatic: Secondary | ICD-10-CM

## 2015-09-13 DIAGNOSIS — E1169 Type 2 diabetes mellitus with other specified complication: Secondary | ICD-10-CM

## 2015-09-13 DIAGNOSIS — E038 Other specified hypothyroidism: Secondary | ICD-10-CM

## 2015-09-13 DIAGNOSIS — K760 Fatty (change of) liver, not elsewhere classified: Secondary | ICD-10-CM

## 2015-09-13 DIAGNOSIS — E785 Hyperlipidemia, unspecified: Secondary | ICD-10-CM

## 2015-09-13 DIAGNOSIS — I10 Essential (primary) hypertension: Secondary | ICD-10-CM

## 2015-09-13 DIAGNOSIS — E669 Obesity, unspecified: Secondary | ICD-10-CM | POA: Diagnosis not present

## 2015-09-13 DIAGNOSIS — E119 Type 2 diabetes mellitus without complications: Secondary | ICD-10-CM

## 2015-09-13 DIAGNOSIS — G4733 Obstructive sleep apnea (adult) (pediatric): Secondary | ICD-10-CM

## 2015-09-13 DIAGNOSIS — F341 Dysthymic disorder: Secondary | ICD-10-CM

## 2015-09-13 DIAGNOSIS — E034 Atrophy of thyroid (acquired): Secondary | ICD-10-CM

## 2015-09-13 MED ORDER — ALPRAZOLAM 0.5 MG PO TABS: ORAL_TABLET | ORAL | Status: DC

## 2015-09-13 MED ORDER — ALPRAZOLAM 0.5 MG PO TABS: 0.5000 mg | ORAL_TABLET | Freq: Two times a day (BID) | ORAL | Status: DC | PRN

## 2015-09-13 MED ORDER — SERTRALINE HCL 100 MG PO TABS: 100.0000 mg | ORAL_TABLET | Freq: Every day | ORAL | Status: DC

## 2015-09-13 NOTE — Patient Instructions (Addendum)
Changing celexa to zoloft on Friday to get your anxiety under better control:  Starting Friday:   take a whole celexa in am .  Take 1/2 zoloft in evening   Saturday:  Take no celexa,  But take a Full tablet of zoloft in the evening .    To avoid low blood sugars, keep a KIND bar on you!!

## 2015-09-13 NOTE — Progress Notes (Signed)
Subjective:  Patient ID: [name], male    DOB: [birth year]  Age: 50 y.o. MRN: [name]  CC: The primary encounter diagnosis was Generalized anxiety disorder. Diagnoses of Fatty liver, Diabetes mellitus type 2 in obese ([place]), Hyperlipidemia associated with type 2 diabetes mellitus ([place]), Hypothyroidism due to acquired atrophy of thyroid, Overweight, OSA (obstructive sleep apnea), Essential hypertension, Complete rotator cuff tear of left shoulder, and ANXIETY DEPRESSION were also pertinent to this visit.  HPI [name] presents for follow up on multiple chronic issues including  Hypertension, chronic pain, type 2 DM, diet controlled, overweight ,  fatty liver ,and  Hypothyroidism. Last seen Dec 2016.  HTN: BP is elevated today despite reported compliance with medications.  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  He cites recent stress before leaving work today as the cause.   Chronic pain:  Low back., left shoulder,  And right  knee.  He has weaned himself off of oxycodone and vicodin and is using tramadol .  No refills of oxycodone and hydrocodone since 2016. 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 His left shoulder has a rotator cuff syndrome with complete tear and retraction of  Supraspinatus and infraspinatus tendons by  Oct 2016  MRI .   DM: he checks sugars occasionally.  Not daily.  Reports fro memory that his fasting Sugars have been < 130  And post prandials  160.  Following a low GI diet most days. Not exercising due to orthopedic issues and work schedule. 4 lb wt gain since December.    Lab Results  Component Value Date   HGBA1C 5.8 03/30/2015        Outpatient Prescriptions Prior to Visit  Medication Sig Dispense Refill  . aspirin EC 81 MG tablet Take 81 mg by mouth daily.    Marland Kitchen BAYER MICROLET LANCETS lancets Use as instructed 100 each 12  . buPROPion (WELLBUTRIN SR) 150 MG 12 hr tablet TAKE 1 TABLET (150  MG TOTAL) BY MOUTH 2 (TWO) TIMES DAILY.**NEEDS OFFICE VISIT** 60 tablet 0  . Clobetasol Propionate (TEMOVATE) 0.05 % external spray Apply topically 2 (two) times daily. 125 mL 5  . Clobetasol Propionate 0.05 % shampoo Apply to dry scalp and leave on for 15 minutes 118 mL 5  . dicyclomine (BENTYL) 20 MG tablet Take 1 tablet (20 mg total) by mouth every 6 (six) hours. 120 tablet 5  . gabapentin (NEURONTIN) 100 MG capsule TAKE 1 CAPSULE (100 MG TOTAL) BY MOUTH 3 (THREE) TIMES DAILY.**NEEDS OFFICE VISIT** 90 capsule 0  . ibuprofen (ADVIL,MOTRIN) 200 MG tablet Take 400 mg by mouth every 6 (six) hours as needed (pain).    Marland Kitchen levothyroxine (SYNTHROID, LEVOTHROID) 88 MCG tablet TAKE 1 TABLET BY MOUTH DAILY BEFORE BREAKFAST. 30 tablet 5  . levothyroxine (SYNTHROID, LEVOTHROID) 88 MCG tablet TAKE 1 TABLET BY MOUTH DAILY BEFORE BREAKFAST. 30 tablet 2  . lisinopril-hydrochlorothiazide (PRINZIDE,ZESTORETIC) 20-25 MG tablet Take 1 tablet by mouth daily. 30 tablet 5  . meloxicam (MOBIC) 15 MG tablet Take 1 tablet (15 mg total) by mouth daily. For joint pain 30 tablet 2  . methocarbamol (ROBAXIN) 500 MG tablet Take 1 tablet (500 mg total) by mouth every 8 (eight) hours as needed for muscle spasms. 30 tablet 0  . methocarbamol (ROBAXIN) 500 MG tablet TAKE 1 TABLET BY MOUTH EVERY 8 HOURS AS NEEDED 90 tablet 0  . methocarbamol (ROBAXIN) 500 MG tablet TAKE  1 TABLET BY MOUTH EVERY 8 HOURS 90 tablet 2  . metoprolol tartrate (LOPRESSOR) 25 MG tablet TAKE 1 TABLET (25 MG TOTAL) BY MOUTH 2 (TWO) TIMES DAILY. 60 tablet 0  . Multiple Vitamin (MULTIVITAMIN WITH MINERALS) TABS tablet Take 1 tablet by mouth daily.    Marland Kitchen omeprazole (PRILOSEC) 40 MG capsule Take 1 capsule (40 mg total) by mouth 2 (two) times daily. 60 capsule 6  . Polyethyl Glycol-Propyl Glycol (SYSTANE OP) Place 1 drop into both eyes daily as needed (burning/irritation).    . rosuvastatin (CRESTOR) 20 MG tablet Take 1 tablet (20 mg total) by mouth daily. 90 tablet  2  . traMADol (ULTRAM) 50 MG tablet TAKE 1 TABLET BY MOUTH EVERY 8 HOURS AS NEEDED FOR PAIN 90 tablet 0  . ALPRAZolam (XANAX) 0.5 MG tablet TAKE 1 TABLET BY MOUTH AT BEDTIME AS NEEDED 30 tablet 5  . citalopram (CELEXA) 20 MG tablet Take 1 tablet (20 mg total) by mouth daily. 30 tablet 4  . HYDROcodone-acetaminophen (NORCO) 10-325 MG tablet Take 1 tablet by mouth every 6 (six) hours as needed. Reported on 09/13/2015  0  . Oxycodone HCl 10 MG TABS Take 1 tablet by mouth 3 (three) times daily as needed. Reported on 09/13/2015  0   No facility-administered medications prior to visit.    Review of Systems;  Patient denies headache, fevers, malaise, unintentional weight loss, skin rash, eye pain, sinus congestion and sinus pain, sore throat, dysphagia,  hemoptysis , cough, dyspnea, wheezing, chest pain, palpitations, orthopnea, edema, abdominal pain, nausea, melena, diarrhea, constipation, flank pain, dysuria, hematuria, urinary  Frequency, nocturia, numbness, tingling, seizures,  Focal weakness, Loss of consciousness,  Tremor,  depression,  and suicidal ideation.      Objective:  BP 160/84 mmHg  Pulse 72  Temp(Src) 98.1 F (36.7 C) (Oral)  Resp 12  Ht 6\' 1"  (1.854 m)  Wt 216 lb 4 oz (98.09 kg)  BMI 28.54 kg/m2  SpO2 95%  BP Readings from Last 3 Encounters:  09/13/15 160/84  08/27/15 121/60  03/30/15 118/74    Wt Readings from Last 3 Encounters:  09/13/15 216 lb 4 oz (98.09 kg)  08/27/15 210 lb (95.255 kg)  03/30/15 212 lb 8 oz (96.389 kg)    General appearance: alert, cooperative and appears stated age Ears: normal TM's and external ear canals both ears Throat: lips, mucosa, and tongue normal; teeth and gums normal Neck: no adenopathy, no carotid bruit, supple, symmetrical, trachea midline and thyroid not enlarged, symmetric, no tenderness/mass/nodules Back: symmetric, no curvature. ROM normal. No CVA tenderness. Lungs: clear to auscultation bilaterally Heart: regular rate and  rhythm, S1, S2 normal, no murmur, click, rub or gallop Abdomen: soft, non-tender; bowel sounds normal; no masses,  no organomegaly Pulses: 2+ and symmetric Skin: Skin color, texture, turgor normal. No rashes or lesions Lymph nodes: Cervical, supraclavicular, and axillary nodes normal.  Lab Results  Component Value Date   HGBA1C 5.8 03/30/2015   HGBA1C 5.9 11/03/2014   HGBA1C 5.3 08/04/2014    Lab Results  Component Value Date   CREATININE 1.01 08/27/2015   CREATININE 1.3 08/26/2015   CREATININE 0.97 03/30/2015    Lab Results  Component Value Date   WBC 4.9 08/27/2015   HGB 14.0 08/27/2015   HCT 41.1 08/27/2015   PLT 272 08/27/2015   GLUCOSE 99 08/27/2015   CHOL 213* 03/30/2015   TRIG 306.0* 03/30/2015   HDL 33.70* 03/30/2015   LDLDIRECT 152.0 03/30/2015   LDLCALC 144* 11/03/2014  ALT 23 08/26/2015   AST 26 08/26/2015   NA 137 08/27/2015   K 4.0 08/27/2015   CL 102 08/27/2015   CREATININE 1.01 08/27/2015   BUN 21* 08/27/2015   CO2 26 08/27/2015   TSH 2.30 03/30/2015   PSA 0.65 03/30/2015   INR 0.98 08/27/2015   HGBA1C 5.8 03/30/2015   MICROALBUR 0.8 03/30/2015    No results found.  Assessment & Plan:   Problem List Items Addressed This Visit    Hyperlipidemia associated with type 2 diabetes mellitus ([place])    He was prescribed  Crestor in December and  Will return for fasting lipids . Tolerating medication   . Lab Results  Component Value Date   CHOL 213* 03/30/2015   HDL 33.70* 03/30/2015   LDLCALC 144* 11/03/2014   LDLDIRECT 152.0 03/30/2015   TRIG 306.0* 03/30/2015   CHOLHDL 6 03/30/2015              ANXIETY DEPRESSION    With frequent insomnia and persistent feeligns of anxiety despite use of celexa 20 mg ..  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  Trial of zoloft as a substitution for celexa  100 mg dose given uncontrolled symptoms.  Refilling alprazolam for once daily use,  Prn  additional doses not more than 3/week       Relevant Medications   sertraline (ZOLOFT) 100 MG tablet   ALPRAZolam (XANAX) 0.5 MG tablet   Essential hypertension    elevated today despite compliance with medications . He will check his blood pressure several times over the next 3-4 weeks and to submit readings for evaluation. Urine microalbumin to creatinine ratio will be repeated today .  Lab Results  Component Value Date   MICROALBUR 0.8 03/30/2015         OSA (obstructive sleep apnea)    Diagnosed by sleep study. Managed by Pulmonology,  he is wearing his CPAP every night a minimum of 6 hours per night and notes improved daytime wakefulness and decreased fatigue         Fatty liver     coonfirmed with liver biopsy done at [facility] Nov 2014.  He  is following a lower glycemic index, has improved glycemic control and has tried to lose weight.   He has had Hep A and B vaccines . Repeat liver enzymes are normal per [facility].   Lab Results  Component Value Date   ALT 23 08/26/2015   AST 26 08/26/2015   ALKPHOS 83 08/26/2015   BILITOT 0.4 03/30/2015                      Diabetes mellitus type 2 in obese [facility])    Diagnosed in Oct 2014 with a1c of 7.8.  Currently well-controlled on diet alone  .  Last  A1c was  less than 6.0 .. Patient is reminded to schedule an annual eye exam and foot exam is normal today. Patient has no history of microalbuminuria. Patient is tolerating statin therapy for CAD risk reduction and on ACE/ARB for renal protection and hypertension.  Lab Results  Component Value Date   HGBA1C 5.8 03/30/2015   Lab Results  Component Value Date   MICROALBUR 0.8 03/30/2015   Lab Results  Component Value Date   ALT 23 08/26/2015   AST 26 08/26/2015   ALKPHOS 83 08/26/2015   BILITOT 0.4 03/30/2015  Overweight    I have addressed  BMI and recommended wt loss of 10% of body weight over the next 6 months using a low glycemic  index diet and regular exercise a minimum of 5 days per week.        Complete rotator cuff tear of left shoulder   Hypothyroidism due to acquired atrophy of thyroid    Previously treated for hyperthyroidism, now hypothyroid.  TSh due  Lab Results  Component Value Date   TSH 2.30 03/30/2015          Other Visit Diagnoses    Generalized anxiety disorder    -  Primary    Relevant Medications    sertraline (ZOLOFT) 100 MG tablet    ALPRAZolam (XANAX) 0.5 MG tablet       I have discontinued Mr. Reigel HYDROcodone-acetaminophen, Oxycodone HCl, citalopram, and ALPRAZolam. I have also changed his ALPRAZolam. Additionally, I am having him start on sertraline. Lastly, I am having him maintain his BAYER MICROLET LANCETS, multivitamin with minerals, ibuprofen, aspirin EC, Polyethyl Glycol-Propyl Glycol (SYSTANE OP), dicyclomine, Clobetasol Propionate, Clobetasol Propionate, meloxicam, lisinopril-hydrochlorothiazide, methocarbamol, omeprazole, rosuvastatin, methocarbamol, levothyroxine, levothyroxine, traMADol, methocarbamol, gabapentin, metoprolol tartrate, and buPROPion.  Meds ordered this encounter  Medications  . sertraline (ZOLOFT) 100 MG tablet    Sig: Take 1 tablet (100 mg total) by mouth daily.    Dispense:  30 tablet    Refill:  3  . DISCONTD: ALPRAZolam (XANAX) 0.5 MG tablet    Sig: TAKE 1 TABLET BY MOUTH AT BEDTIME AS NEEDED    Dispense:  45 tablet    Refill:  5  . ALPRAZolam (XANAX) 0.5 MG tablet    Sig: Take 1 tablet (0.5 mg total) by mouth 2 (two) times daily as needed for anxiety.    Dispense:  45 tablet    Refill:  5    Medications Discontinued During This Encounter  Medication Reason  . ALPRAZolam (XANAX) 0.5 MG tablet Reorder  . ALPRAZolam (XANAX) 0.5 MG tablet Reorder  . citalopram (CELEXA) 20 MG tablet Change in therapy  . Oxycodone HCl 10 MG TABS   . HYDROcodone-acetaminophen (NORCO) 10-325 MG tablet     Follow-up: No Follow-up on file.   [name], MD

## 2015-09-13 NOTE — Assessment & Plan Note (Addendum)
Diagnosed in Oct 2014 with a1c of 7.8.  Currently well-controlled on diet alone  .  Last  A1c was  less than 6.0 .. Patient is reminded to schedule an annual eye exam and foot exam is normal today. Patient has no history of microalbuminuria. Patient is tolerating statin therapy for CAD risk reduction and on ACE/ARB for renal protection and hypertension.  Lab Results  Component Value Date   HGBA1C 5.8 03/30/2015   Lab Results  Component Value Date   MICROALBUR 0.8 03/30/2015   Lab Results  Component Value Date   ALT 23 08/26/2015   AST 26 08/26/2015   ALKPHOS 83 08/26/2015   BILITOT 0.4 03/30/2015

## 2015-09-13 NOTE — Progress Notes (Signed)
Pre-visit discussion using our clinic review tool. No additional management support is needed unless otherwise documented below in the visit note.  

## 2015-09-14 DIAGNOSIS — M75121 Complete rotator cuff tear or rupture of right shoulder, not specified as traumatic: Secondary | ICD-10-CM | POA: Insufficient documentation

## 2015-09-14 DIAGNOSIS — E034 Atrophy of thyroid (acquired): Secondary | ICD-10-CM | POA: Insufficient documentation

## 2015-09-14 DIAGNOSIS — M75122 Complete rotator cuff tear or rupture of left shoulder, not specified as traumatic: Secondary | ICD-10-CM | POA: Insufficient documentation

## 2015-09-14 LAB — HEMOGLOBIN A1C: Hgb A1c MFr Bld: 6 % (ref 4.6–6.5)

## 2015-09-14 LAB — LIPID PANEL
CHOLESTEROL: 153 mg/dL (ref 0–200)
HDL: 37.4 mg/dL — AB (ref >39.00)
LDL Cholesterol: 75 mg/dL (ref 0–99)
NonHDL: 115.13
Total CHOL/HDL Ratio: 4
Triglycerides: 200 mg/dL — ABNORMAL HIGH (ref 0.0–149.0)
VLDL: 40 mg/dL (ref 0.0–40.0)

## 2015-09-14 LAB — MICROALBUMIN / CREATININE URINE RATIO
Creatinine,U: 20 mg/dL
Microalb Creat Ratio: 3.5 mg/g (ref 0.0–30.0)
Microalb, Ur: <0.7 mg/dL (ref 0.0–1.9)

## 2015-09-14 LAB — LDL CHOLESTEROL, DIRECT: LDL DIRECT: 99 mg/dL

## 2015-09-14 LAB — TSH: TSH: 2.58 u[IU]/mL (ref 0.35–4.50)

## 2015-09-14 NOTE — Assessment & Plan Note (Addendum)
coonfirmed with liver biopsy done at [facility] Nov 2014.  He  is following a lower glycemic index, has improved glycemic control and has tried to lose weight.   He has had Hep A and B vaccines . Repeat liver enzymes are normal per [facility].   Lab Results  Component Value Date   ALT 23 08/26/2015   AST 26 08/26/2015   ALKPHOS 83 08/26/2015   BILITOT 0.4 03/30/2015

## 2015-09-14 NOTE — Assessment & Plan Note (Signed)
Previously treated for hyperthyroidism, now hypothyroid.  TSh due  Lab Results  Component Value Date   TSH 2.30 03/30/2015

## 2015-09-14 NOTE — Assessment & Plan Note (Signed)
I have addressed  BMI and recommended wt loss of 10% of body weight over the next 6 months using a low glycemic index diet and regular exercise a minimum of 5 days per week.   

## 2015-09-14 NOTE — Assessment & Plan Note (Signed)
With frequent insomnia and persistent feeligns of anxiety despite use of celexa 20 mg ..  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  Trial of zoloft as a substitution for celexa  100 mg dose given uncontrolled symptoms.  Refilling alprazolam for once daily use,  Prn additional doses not more than 3/week

## 2015-09-14 NOTE — Assessment & Plan Note (Signed)
Diagnosed by sleep study. Managed by Pulmonology,  he is wearing his CPAP every night a minimum of 6 hours per night and notes improved daytime wakefulness and decreased fatigue

## 2015-09-14 NOTE — Assessment & Plan Note (Signed)
He was prescribed  Crestor in December and  Will return for fasting lipids . Tolerating medication   . Lab Results  Component Value Date   CHOL 213* 03/30/2015   HDL 33.70* 03/30/2015   LDLCALC 144* 11/03/2014   LDLDIRECT 152.0 03/30/2015   TRIG 306.0* 03/30/2015   CHOLHDL 6 03/30/2015

## 2015-09-14 NOTE — Assessment & Plan Note (Signed)
elevated today despite compliance with medications . He will check his blood pressure several times over the next 3-4 weeks and to submit readings for evaluation. Urine microalbumin to creatinine ratio will be repeated today .  Lab Results  Component Value Date   MICROALBUR 0.8 03/30/2015

## 2015-09-16 ENCOUNTER — Encounter: Payer: Self-pay | Admitting: Internal Medicine

## 2015-10-01 ENCOUNTER — Other Ambulatory Visit: Payer: Self-pay | Admitting: Internal Medicine

## 2015-10-06 ENCOUNTER — Other Ambulatory Visit: Payer: Self-pay | Admitting: Internal Medicine

## 2015-10-07 NOTE — Telephone Encounter (Signed)
Last 09/13/15 last fill was 5/16 for 90 no refills tramadol

## 2015-10-11 ENCOUNTER — Encounter: Payer: Self-pay | Admitting: Internal Medicine

## 2015-10-14 ENCOUNTER — Encounter: Payer: Self-pay | Admitting: Family Medicine

## 2015-10-14 ENCOUNTER — Ambulatory Visit (INDEPENDENT_AMBULATORY_CARE_PROVIDER_SITE_OTHER): Payer: BLUE CROSS/BLUE SHIELD | Admitting: Family Medicine

## 2015-10-14 VITALS — BP 132/82 | HR 69 | Temp 98.2°F | Ht 73.0 in | Wt 213.2 lb

## 2015-10-14 DIAGNOSIS — S70361A Insect bite (nonvenomous), right thigh, initial encounter: Secondary | ICD-10-CM | POA: Diagnosis not present

## 2015-10-14 DIAGNOSIS — W57XXXA Bitten or stung by nonvenomous insect and other nonvenomous arthropods, initial encounter: Secondary | ICD-10-CM

## 2015-10-14 MED ORDER — TRIAMCINOLONE ACETONIDE 0.1 % EX OINT: 1.0000 "application " | TOPICAL_OINTMENT | Freq: Two times a day (BID) | CUTANEOUS | Status: DC

## 2015-10-14 NOTE — Patient Instructions (Signed)
Appears to be a local reaction.  Use the topical ointment and follow up if it worsens or fails to improve.  Take care  Dr. Lacinda Axon   Tick Bite Information Ticks are insects that attach themselves to the skin and draw blood for food. There are various types of ticks. Common types include wood ticks and deer ticks. Most ticks live in shrubs and grassy areas. Ticks can climb onto your body when you make contact with leaves or grass where the tick is waiting. The most common places on the body for ticks to attach themselves are the scalp, neck, armpits, waist, and groin. Most tick bites are harmless, but sometimes ticks carry germs that cause diseases. These germs can be spread to a person during the tick's feeding process. The chance of a disease spreading through a tick bite depends on:   The type of tick.  Time of year.   How long the tick is attached.   Geographic location.  HOW CAN YOU PREVENT TICK BITES? Take these steps to help prevent tick bites when you are outdoors:  Wear protective clothing. Long sleeves and long pants are best.   Wear white clothes so you can see ticks more easily.  Tuck your pant legs into your socks.   If walking on a trail, stay in the middle of the trail to avoid brushing against bushes.  Avoid walking through areas with long grass.  Put insect repellent on all exposed skin and along boot tops, pant legs, and sleeve cuffs.   Check clothing, hair, and skin repeatedly and before going inside.   Brush off any ticks that are not attached.  Take a shower or bath as soon as possible after being outdoors.  WHAT IS THE PROPER WAY TO REMOVE A TICK? Ticks should be removed as soon as possible to help prevent diseases caused by tick bites. 1. If latex gloves are available, put them on before trying to remove a tick.  2. Using fine-point tweezers, grasp the tick as close to the skin as possible. You may also use curved forceps or a tick removal tool.  Grasp the tick as close to its head as possible. Avoid grasping the tick on its body. 3. Pull gently with steady upward pressure until the tick lets go. Do not twist the tick or jerk it suddenly. This may break off the tick's head or mouth parts. 4. Do not squeeze or crush the tick's body. This could force disease-carrying fluids from the tick into your body.  5. After the tick is removed, wash the bite area and your hands with soap and water or other disinfectant such as alcohol. 6. Apply a small amount of antiseptic cream or ointment to the bite site.  7. Wash and disinfect any instruments that were used.  Do not try to remove a tick by applying a hot match, petroleum jelly, or fingernail polish to the tick. These methods do not work and may increase the chances of disease being spread from the tick bite.  WHEN SHOULD YOU SEEK MEDICAL CARE? Contact your health care provider if you are unable to remove a tick from your skin or if a part of the tick breaks off and is stuck in the skin.  After a tick bite, you need to be aware of signs and symptoms that could be related to diseases spread by ticks. Contact your health care provider if you develop any of the following in the days or weeks after the tick  bite:  Unexplained fever.  Rash. A circular rash that appears days or weeks after the tick bite may indicate the possibility of Lyme disease. The rash may resemble a target with a bull's-eye and may occur at a different part of your body than the tick bite.  Redness and swelling in the area of the tick bite.   Tender, swollen lymph glands.   Diarrhea.   Weight loss.   Cough.   Fatigue.   Muscle, joint, or bone pain.   Abdominal pain.   Headache.   Lethargy or a change in your level of consciousness.  Difficulty walking or moving your legs.   Numbness in the legs.   Paralysis.  Shortness of breath.   Confusion.   Repeated vomiting.    This information is  not intended to replace advice given to you by your health care provider. Make sure you discuss any questions you have with your health care provider.   Document Released: 04/07/2000 Document Revised: 05/01/2014 Document Reviewed: 09/18/2012 Elsevier Interactive Patient Education [employer].

## 2015-10-14 NOTE — Progress Notes (Signed)
Pre visit review using our clinic review tool, if applicable. No additional management support is needed unless otherwise documented below in the visit note. 

## 2015-10-15 DIAGNOSIS — W57XXXA Bitten or stung by nonvenomous insect and other nonvenomous arthropods, initial encounter: Secondary | ICD-10-CM | POA: Insufficient documentation

## 2015-10-15 NOTE — Progress Notes (Signed)
Subjective:  Patient ID: [name], male    DOB: 07-Nov-1965  Age: 50 y.o. MRN: [id number]  CC: Tick bite  HPI:  50 year old male with a past medical history of arthritis and polyarthralgia presents with complaints of tick bite.  Patient reports that he started take on him on Saturday. He removed it. Tick bite was at his left upper thigh/inguinal region. Patient states that the area by has been worsening and he's been experiencing significant joint pain, headache. No associated fevers or chills. No known exacerbating or relieving factors. He is concerned about his symptoms and thinks it may be secondary to the tick bite. No other complaints at this time.  Social Hx   Social History   Social History  . Marital Status: Married    Spouse Name: N/A  . Number of Children: 2  . Years of Education: N/A   Occupational History  . Maintenance supervisor   . SERVICE DIRECTOR    Social History Main Topics  . Smoking status: Former Smoker -- 2.00 packs/day for 18 years    Types: Cigarettes    Quit date: 04/24/2005  . Smokeless tobacco: Never Used     Comment: quit 20 years ago   . Alcohol Use: 10.2 oz/week    3 Shots of liquor, 14 Standard drinks or equivalent per week     Comment: 1 a day  . Drug Use: No  . Sexual Activity: Yes   Other Topics Concern  . None   Social History Narrative   Married, gets regular exercise.    Review of Systems  Constitutional: Negative for fever.  Musculoskeletal: Positive for arthralgias.  Skin:       Tick bite.  Neurological: Positive for headaches.   Objective:  BP 132/82 mmHg  Pulse 69  Temp(Src) 98.2 F (36.8 C) (Oral)  Ht 6\' 1"  (1.854 m)  Wt 213 lb 4 oz (96.73 kg)  BMI 28.14 kg/m2  SpO2 96%  BP/Weight 10/14/2015 [id number]  Systolic BP [id number]  Diastolic BP 82 84 60  Wt. (Lbs) 213.25 216.25 210  BMI 28.14 28.54 28.47   Physical Exam  Constitutional: He is oriented to person, place, and time. He appears  well-developed. No distress.  Pulmonary/Chest: Effort normal.  Neurological: He is alert and oriented to person, place, and time.  Skin:  R upper thigh just below inguinal crease - area of tick bite with small central eschar and mild surrounding erythema (~2.5 cm). No fluctuance or drainage.  Psychiatric: He has a normal mood and affect.  Vitals reviewed.  Lab Results  Component Value Date   WBC 4.9 08/27/2015   HGB 14.0 08/27/2015   HCT 41.1 08/27/2015   PLT 272 08/27/2015   GLUCOSE 99 08/27/2015   CHOL 153 09/13/2015   TRIG 200.0* 09/13/2015   HDL 37.40* 09/13/2015   LDLDIRECT 99.0 09/13/2015   LDLCALC 75 09/13/2015   ALT 23 08/26/2015   AST 26 08/26/2015   NA 137 08/27/2015   K 4.0 08/27/2015   CL 102 08/27/2015   CREATININE 1.01 08/27/2015   BUN 21* 08/27/2015   CO2 26 08/27/2015   TSH 2.58 09/13/2015   PSA 0.65 03/30/2015   INR 0.98 08/27/2015   HGBA1C 6.0 09/13/2015   MICROALBUR <0.7 09/13/2015   Assessment & Plan:   Problem List Items Addressed This Visit    Tick bite - Primary    New acute problem. Site of tick bite appears to be just a local reaction.  No cellulitis or developing abscess. Patient's other associated symptoms appear to be chronic. He has known polyarthralgias and they're all documented in the chart. Will hold off on antibiotic treatment at this time. Triamcinolone for erythema/itching/local reaction.         Meds ordered this encounter  Medications  . triamcinolone ointment (KENALOG) 0.1 %    Sig: Apply 1 application topically 2 (two) times daily.    Dispense:  30 g    Refill:  0    Follow-up: PRN  Joy

## 2015-10-15 NOTE — Assessment & Plan Note (Signed)
New acute problem. Site of tick bite appears to be just a local reaction. No cellulitis or developing abscess. Patient's other associated symptoms appear to be chronic. He has known polyarthralgias and they're all documented in the chart. Will hold off on antibiotic treatment at this time. Triamcinolone for erythema/itching/local reaction.

## 2015-10-21 NOTE — Telephone Encounter (Signed)
This was missed bc it was not bold when sent

## 2015-10-22 NOTE — Telephone Encounter (Signed)
Don't know why was unbold was sent the way I always send sorry.

## 2015-10-25 ENCOUNTER — Other Ambulatory Visit: Payer: Self-pay

## 2015-10-25 MED ORDER — LISINOPRIL-HYDROCHLOROTHIAZIDE 20-25 MG PO TABS: 1.0000 | ORAL_TABLET | Freq: Every day | ORAL | Status: DC

## 2015-10-25 NOTE — Telephone Encounter (Signed)
Medication refilled

## 2015-10-30 ENCOUNTER — Other Ambulatory Visit: Payer: Self-pay | Admitting: Internal Medicine

## 2015-11-25 ENCOUNTER — Other Ambulatory Visit: Payer: Self-pay | Admitting: Internal Medicine

## 2015-12-21 ENCOUNTER — Other Ambulatory Visit: Payer: Self-pay | Admitting: Internal Medicine

## 2016-01-04 ENCOUNTER — Other Ambulatory Visit: Payer: Self-pay | Admitting: Internal Medicine

## 2016-01-07 ENCOUNTER — Encounter: Payer: Self-pay | Admitting: Internal Medicine

## 2016-01-08 ENCOUNTER — Other Ambulatory Visit: Payer: Self-pay | Admitting: Internal Medicine

## 2016-01-27 ENCOUNTER — Other Ambulatory Visit: Payer: Self-pay | Admitting: Internal Medicine

## 2016-01-27 DIAGNOSIS — F411 Generalized anxiety disorder: Secondary | ICD-10-CM

## 2016-01-29 ENCOUNTER — Other Ambulatory Visit: Payer: Self-pay | Admitting: Internal Medicine

## 2016-02-17 ENCOUNTER — Encounter: Payer: Self-pay | Admitting: Internal Medicine

## 2016-02-24 ENCOUNTER — Other Ambulatory Visit: Payer: Self-pay | Admitting: Internal Medicine

## 2016-02-28 ENCOUNTER — Other Ambulatory Visit: Payer: Self-pay | Admitting: Internal Medicine

## 2016-03-20 ENCOUNTER — Encounter: Payer: Self-pay | Admitting: Internal Medicine

## 2016-03-21 ENCOUNTER — Other Ambulatory Visit: Payer: Self-pay | Admitting: Internal Medicine

## 2016-03-31 ENCOUNTER — Ambulatory Visit (INDEPENDENT_AMBULATORY_CARE_PROVIDER_SITE_OTHER): Payer: BLUE CROSS/BLUE SHIELD | Admitting: Family Medicine

## 2016-03-31 ENCOUNTER — Encounter: Payer: Self-pay | Admitting: Family Medicine

## 2016-03-31 DIAGNOSIS — J069 Acute upper respiratory infection, unspecified: Secondary | ICD-10-CM | POA: Diagnosis not present

## 2016-03-31 DIAGNOSIS — B9789 Other viral agents as the cause of diseases classified elsewhere: Secondary | ICD-10-CM | POA: Diagnosis not present

## 2016-03-31 NOTE — Patient Instructions (Addendum)
This is viral.  Tylenol/ibuprofen as needed.  OTC Flonase and an antihistamine for the drainage.  Take care  Dr. Lacinda Axon

## 2016-03-31 NOTE — Progress Notes (Signed)
Subjective:  Patient ID: [name], male    DOB: Mar 14, 1966  Age: 50 y.o. MRN: [name]  CC: ST, congestion, HA, cough  HPI:  50 year old male presents for an acute visit with the above complaints.  Patient states he woke up this morning with sore throat, congestion, headache. He states that it is painful to cough. No associated fever. No medications or interventions tried. No known exacerbating or relieving factors. No other associated symptoms. No other complaints this time.  Social Hx   Social History   Social History  . Marital status: Married    Spouse name: N/A  . Number of children: 2  . Years of education: N/A   Occupational History  . Maintenance Lexicographer  . SERVICE DIRECTOR [employer]   Social History Main Topics  . Smoking status: Former Smoker    Packs/day: 2.00    Years: 18.00    Types: Cigarettes    Quit date: 04/24/2005  . Smokeless tobacco: Never Used     Comment: quit 20 years ago   . Alcohol use 10.2 oz/week    3 Shots of liquor, 14 Standard drinks or equivalent per week     Comment: 1 a day  . Drug use: No  . Sexual activity: Yes   Other Topics Concern  . None   Social History Narrative   Married, gets regular exercise.     Review of Systems  Constitutional: Negative for fever.  HENT: Positive for congestion and sore throat.   Respiratory: Positive for cough.   Neurological: Positive for headaches.   Objective:  BP 125/73 (BP Location: Left Arm, Patient Position: Sitting, Cuff Size: Normal)   Pulse 66   Temp 98.2 F (36.8 C) (Oral)   Resp 14   Wt 218 lb 4 oz (99 kg)   SpO2 98%   BMI 28.79 kg/m   BP/Weight 03/31/2016 10/14/2015 99991111  Systolic BP 0000000 [id number] 0000000  Diastolic BP 73 82 84  Wt. (Lbs) 218.25 213.25 216.25  BMI 28.79 28.14 28.54   Physical Exam  Constitutional: He is oriented to person, place, and time. He appears well-developed. No distress.  HENT:  Head: Normocephalic and  atraumatic.  Mouth/Throat: Oropharynx is clear and moist. No oropharyngeal exudate.  Normal TM's bilaterally.  Cardiovascular: Normal rate and regular rhythm.   Pulmonary/Chest: Effort normal and breath sounds normal.  Neurological: He is alert and oriented to person, place, and time.  Psychiatric: He has a normal mood and affect.  Vitals reviewed.  Lab Results  Component Value Date   WBC 4.9 08/27/2015   HGB 14.0 08/27/2015   HCT 41.1 08/27/2015   PLT 272 08/27/2015   GLUCOSE 99 08/27/2015   CHOL 153 09/13/2015   TRIG 200.0 (H) 09/13/2015   HDL 37.40 (L) 09/13/2015   LDLDIRECT 99.0 09/13/2015   LDLCALC 75 09/13/2015   ALT 23 08/26/2015   AST 26 08/26/2015   NA 137 08/27/2015   K 4.0 08/27/2015   CL 102 08/27/2015   CREATININE 1.01 08/27/2015   BUN 21 (H) 08/27/2015   CO2 26 08/27/2015   TSH 2.58 09/13/2015   PSA 0.65 03/30/2015   INR 0.98 08/27/2015   HGBA1C 6.0 09/13/2015   MICROALBUR <0.7 09/13/2015    Assessment & Plan:   Problem List Items Addressed This Visit    Viral URI    New acute problem. Exam unremarkable. No indication for antibiotics. Advised over-the-counter ibuprofen and Tylenol as well as Flonase and  an antihistamine.        Follow-up: PRN  [place]

## 2016-03-31 NOTE — Assessment & Plan Note (Signed)
New acute problem. Exam unremarkable. No indication for antibiotics. Advised over-the-counter ibuprofen and Tylenol as well as Flonase and an antihistamine.

## 2016-03-31 NOTE — Progress Notes (Signed)
Pre visit review using our clinic review tool, if applicable. No additional management support is needed unless otherwise documented below in the visit note. 

## 2016-04-18 ENCOUNTER — Other Ambulatory Visit: Payer: Self-pay | Admitting: Internal Medicine

## 2016-04-19 NOTE — Telephone Encounter (Signed)
Last OV 09/13/15 patient does not have a scheduled follow up.

## 2016-04-29 ENCOUNTER — Emergency Department: Payer: BLUE CROSS/BLUE SHIELD

## 2016-04-29 ENCOUNTER — Emergency Department
Admission: EM | Admit: 2016-04-29 | Discharge: 2016-04-29 | Disposition: A | Discharge status: Home / Self Care | Payer: BLUE CROSS/BLUE SHIELD | Attending: Emergency Medicine | Admitting: Emergency Medicine

## 2016-04-29 ENCOUNTER — Encounter: Payer: Self-pay | Admitting: Emergency Medicine

## 2016-04-29 DIAGNOSIS — E039 Hypothyroidism, unspecified: Secondary | ICD-10-CM | POA: Diagnosis not present

## 2016-04-29 DIAGNOSIS — Z87891 Personal history of nicotine dependence: Secondary | ICD-10-CM | POA: Insufficient documentation

## 2016-04-29 DIAGNOSIS — Z7982 Long term (current) use of aspirin: Secondary | ICD-10-CM | POA: Insufficient documentation

## 2016-04-29 DIAGNOSIS — Z79899 Other long term (current) drug therapy: Secondary | ICD-10-CM | POA: Insufficient documentation

## 2016-04-29 DIAGNOSIS — R1032 Left lower quadrant pain: Secondary | ICD-10-CM | POA: Diagnosis present

## 2016-04-29 DIAGNOSIS — I10 Essential (primary) hypertension: Secondary | ICD-10-CM | POA: Insufficient documentation

## 2016-04-29 DIAGNOSIS — E119 Type 2 diabetes mellitus without complications: Secondary | ICD-10-CM | POA: Insufficient documentation

## 2016-04-29 DIAGNOSIS — K5732 Diverticulitis of large intestine without perforation or abscess without bleeding: Secondary | ICD-10-CM | POA: Diagnosis not present

## 2016-04-29 LAB — URINALYSIS, COMPLETE (UACMP) WITH MICROSCOPIC
BACTERIA UA: NONE SEEN
Bilirubin Urine: NEGATIVE
Glucose, UA: NEGATIVE mg/dL
HGB URINE DIPSTICK: NEGATIVE
Ketones, ur: NEGATIVE mg/dL
Leukocytes, UA: NEGATIVE
NITRITE: NEGATIVE
PH: 6 (ref 5.0–8.0)
Protein, ur: NEGATIVE mg/dL
SPECIFIC GRAVITY, URINE: 1.004 — AB (ref 1.005–1.030)
Squamous Epithelial / LPF: NONE SEEN
WBC UA: NONE SEEN WBC/hpf (ref 0–5)

## 2016-04-29 LAB — CBC
HCT: 43 % (ref 40.0–52.0)
HEMOGLOBIN: 14.7 g/dL (ref 13.0–18.0)
MCH: 31.3 pg (ref 26.0–34.0)
MCHC: 34.1 g/dL (ref 32.0–36.0)
MCV: 91.8 fL (ref 80.0–100.0)
Platelets: 268 10*3/uL (ref 150–440)
RBC: 4.68 MIL/uL (ref 4.40–5.90)
RDW: 13.2 % (ref 11.5–14.5)
WBC: 11.3 10*3/uL — ABNORMAL HIGH (ref 3.8–10.6)

## 2016-04-29 LAB — COMPREHENSIVE METABOLIC PANEL
ALBUMIN: 4.8 g/dL (ref 3.5–5.0)
ALK PHOS: 102 U/L (ref 38–126)
ALT: 25 U/L (ref 17–63)
ANION GAP: 6 (ref 5–15)
AST: 26 U/L (ref 15–41)
BILIRUBIN TOTAL: 1 mg/dL (ref 0.3–1.2)
BUN: 22 mg/dL — ABNORMAL HIGH (ref 6–20)
CALCIUM: 9.3 mg/dL (ref 8.9–10.3)
CO2: 30 mmol/L (ref 22–32)
Chloride: 100 mmol/L — ABNORMAL LOW (ref 101–111)
Creatinine, Ser: 0.98 mg/dL (ref 0.61–1.24)
GFR calc Af Amer: >60 mL/min (ref >60)
GFR calc non Af Amer: >60 mL/min (ref >60)
GLUCOSE: 115 mg/dL — AB (ref 65–99)
Potassium: 4.1 mmol/L (ref 3.5–5.1)
SODIUM: 136 mmol/L (ref 135–145)
TOTAL PROTEIN: 7.9 g/dL (ref 6.5–8.1)

## 2016-04-29 LAB — LIPASE, BLOOD: Lipase: 28 U/L (ref 11–51)

## 2016-04-29 MED ORDER — IOPAMIDOL (ISOVUE-300) INJECTION 61%
100.0000 mL | Freq: Once | INTRAVENOUS | Status: AC | PRN
  Administered 2016-04-29: 100 mL via INTRAVENOUS

## 2016-04-29 MED ORDER — IOPAMIDOL (ISOVUE-300) INJECTION 61%
30.0000 mL | Freq: Once | INTRAVENOUS | Status: AC
Start: 2016-04-29 — End: 2016-04-29
  Administered 2016-04-29: 30 mL via ORAL

## 2016-04-29 MED ORDER — ONDANSETRON 4 MG PO TBDP: 4.0000 mg | ORAL_TABLET | Freq: Three times a day (TID) | ORAL | 0 refills | Status: DC | PRN

## 2016-04-29 MED ORDER — KETOROLAC TROMETHAMINE 30 MG/ML IJ SOLN
15.0000 mg | INTRAMUSCULAR | Status: AC
  Administered 2016-04-29: 15 mg via INTRAVENOUS
  Filled 2016-04-29: qty 1

## 2016-04-29 MED ORDER — CIPROFLOXACIN HCL 500 MG PO TABS: 500.0000 mg | ORAL_TABLET | Freq: Two times a day (BID) | ORAL | 0 refills | Status: DC

## 2016-04-29 MED ORDER — OXYCODONE-ACETAMINOPHEN 5-325 MG PO TABS
1.0000 | ORAL_TABLET | Freq: Four times a day (QID) | ORAL | 0 refills | Status: DC | PRN
Start: 2016-04-29 — End: 2016-09-06

## 2016-04-29 MED ORDER — SODIUM CHLORIDE 0.9 % IV BOLUS (SEPSIS)
1000.0000 mL | Freq: Once | INTRAVENOUS | Status: AC
  Administered 2016-04-29: 1000 mL via INTRAVENOUS

## 2016-04-29 MED ORDER — METRONIDAZOLE 500 MG PO TABS
500.0000 mg | ORAL_TABLET | Freq: Three times a day (TID) | ORAL | 0 refills | Status: DC
Start: 2016-04-29 — End: 2016-06-05

## 2016-04-29 MED ORDER — NAPROXEN 500 MG PO TABS: 500.0000 mg | ORAL_TABLET | Freq: Two times a day (BID) | ORAL | 0 refills | Status: DC

## 2016-04-29 NOTE — ED Provider Notes (Signed)
[facility] Emergency Department Provider Note  ____________________________________________  Time seen: Approximately 3:38 PM  I have reviewed the triage vital signs and the nursing notes.   HISTORY  Chief Complaint Groin Pain and Abdominal Pain (LLQ)    HPI [name] is a 51 y.o. male who complains of left lower quadrant abdominal pain that started this morning on getting out of bed. Was not sudden or related to any particular movement. No trauma. No fevers chills sweats vomiting or diarrhea. No chest pain or shortness of breath or other symptoms. Never had anything like this before. Did have a brief episode of sharp left flank pain yesterday afternoon while at work which resolved. He's been tolerating oral intake just fine. No history of kidney stones or diverticulitis. Pain is severe, sharp and aching. Worse with movement and palpation of the area. Nonradiating.    Past Medical History:  Diagnosis Date  . Anxiety   . Anxiety and depression   . Bruit    L  . Chest pain    hx  . Diabetes mellitus without complication ([place])    "borderline"  . Edema   . Fatty liver   . GERD (gastroesophageal reflux disease)    uses Omeprazole  . Goiter   . HLD (hyperlipidemia)   . Hypertension    essential, benign  . Hypothyroidism   . Impotence of organic origin   . Murmur   . Other chest pain    tightness, pressure  . Palpitation    hx  . Precordial pain   . Sleep apnea    uses cpap     Patient Active Problem List   Diagnosis Date Noted  . Viral URI 03/31/2016  . 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 Hypothyroidism due to acquired atrophy of thyroid 09/14/2015  . Flushing reaction 03/30/2015  . Arthritis of knee 06/23/2014  . Pre-operative cardiovascular examination 06/17/2014  . Enlarged RV (right ventricle) 06/17/2014  . Cervical stenosis of spine 02/17/2014  . Overweight 10/29/2013  . Chronic knee pain 10/29/2013  .  Umbilical hernia [id number]  . Diabetes mellitus type 2 in obese ([place]) 10/23/2012  . Seborrheic dermatitis, unspecified 05/17/2012  . Fatty liver 04/22/2012  . Polyarthralgia 12/24/2010  . Sciatica of right side 12/24/2010  . Hyperlipidemia associated with type 2 diabetes mellitus ([place]) 01/13/2009  . ANXIETY DEPRESSION 01/13/2009  . Essential hypertension 01/13/2009  . OSA (obstructive sleep apnea) 01/13/2009     Past Surgical History:  Procedure Laterality Date  . APPENDECTOMY  2002   done at [facility]  . CARDIAC CATHETERIZATION    . CARDIAC CATHETERIZATION N/A 08/27/2015   Procedure: Left Heart Cath and Coronary Angiography;  Surgeon: Jolaine Artist, MD;  Location: [facility];  Service: Cardiovascular;  Laterality: N/A;  . COLONOSCOPY    . HERNIA REPAIR  [id number]   umbilical  . JOINT REPLACEMENT     right knee  . REVISION TOTAL KNEE ARTHROPLASTY Right 06/23/2014   DR Marlou Sa  . TONSILLECTOMY    . TOTAL KNEE ARTHROPLASTY Right 2011   right  . TOTAL KNEE REVISION Right 06/23/2014   Procedure: TOTAL KNEE REVISION;  Surgeon: Meredith Pel, MD;  Location: [facility];  Service: Orthopedics;  Laterality: Right;     Prior to Admission medications   Medication Sig Start Date End Date Taking? Authorizing Provider  ALPRAZolam (XANAX) 0.5 MG tablet TAKE 1 TABLET BY MOUTH 2 TIMES DAILY AS NEEDED FOR ANXIETY 04/19/16  [name], MD  aspirin EC 81 MG tablet Take 81 mg by mouth daily.    Historical Provider, MD  BAYER MICROLET LANCETS lancets Use as instructed 05/30/13   [name], MD  buPROPion [facility] SR) 150 MG 12 hr tablet TAKE 1 TABLET BY MOUTH TWICE A DAY (NEEDS OFFICE VISIT) 03/22/16   [name], MD  ciprofloxacin (CIPRO) 500 MG tablet Take 1 tablet (500 mg total) by mouth 2 (two) times daily. 04/29/16   Carrie Mew, MD  Clobetasol Propionate (TEMOVATE) 0.05 % external spray Apply topically 2 (two) times daily. 03/30/15   [name], MD  Clobetasol  Propionate 0.05 % shampoo Apply to dry scalp and leave on for 15 minutes 03/30/15   [name], MD  dicyclomine (BENTYL) 20 MG tablet TAKE 1 TABLET (20 MG TOTAL) BY MOUTH EVERY 6 (SIX) HOURS. 12/21/15   [name], MD  gabapentin (NEURONTIN) 100 MG capsule TAKE 1 CAPSULE (100 MG TOTAL) BY MOUTH 3 (THREE) TIMES DAILY.**NEEDS OFFICE VISIT** 09/07/15   [name], MD  ibuprofen (ADVIL,MOTRIN) 200 MG tablet Take 400 mg by mouth every 6 (six) hours as needed (pain).    Historical Provider, MD  levothyroxine (SYNTHROID, LEVOTHROID) 88 MCG tablet TAKE 1 TABLET BY MOUTH DAILY BEFORE BREAKFAST. 01/10/16   [name], MD  lisinopril-hydrochlorothiazide (PRINZIDE,ZESTORETIC) 20-25 MG tablet Take 1 tablet by mouth daily. 10/25/15   [name], MD  meloxicam (MOBIC) 15 MG tablet Take 1 tablet (15 mg total) by mouth daily. For joint pain 03/30/15   [name], MD  methocarbamol (ROBAXIN) 500 MG tablet TAKE 1 TABLET BY MOUTH EVERY 8 HOURS AS NEEDED 05/17/15   [name], MD  methocarbamol (ROBAXIN) 500 MG tablet TAKE 1 TABLET BY MOUTH EVERY 8 HOURS 04/19/16   [name], MD  metoprolol tartrate (LOPRESSOR) 25 MG tablet TAKE 1 TABLET BY MOUTH TWICE A DAY 02/29/16   [name], MD  metroNIDAZOLE (FLAGYL) 500 MG tablet Take 1 tablet (500 mg total) by mouth 3 (three) times daily. 04/29/16   Carrie Mew, MD  Multiple Vitamin (MULTIVITAMIN WITH MINERALS) TABS tablet Take 1 tablet by mouth daily.    Historical Provider, MD  naproxen (NAPROSYN) 500 MG tablet Take 1 tablet (500 mg total) by mouth 2 (two) times daily with a meal. 04/29/16   Carrie Mew, MD  omeprazole (PRILOSEC) 40 MG capsule TAKE 1 CAPSULE (40 MG TOTAL) BY MOUTH 2 (TWO) TIMES DAILY. 02/24/16   [name], MD  ondansetron (ZOFRAN ODT) 4 MG disintegrating tablet Take 1 tablet (4 mg total) by mouth every 8 (eight) hours as needed for nausea or vomiting. 04/29/16   Carrie Mew, MD  oxyCODONE-acetaminophen (ROXICET) 5-325 MG  tablet Take 1 tablet by mouth every 6 (six) hours as needed for severe pain. 04/29/16   Carrie Mew, MD  Polyethyl Glycol-Propyl Glycol (SYSTANE OP) Place 1 drop into both eyes daily as needed (burning/irritation). Reported on 10/14/2015    Historical Provider, MD  rosuvastatin (CRESTOR) 20 MG tablet TAKE 1 TABLET (20 MG TOTAL) BY MOUTH DAILY. 01/04/16   [name], MD  sertraline (ZOLOFT) 100 MG tablet TAKE 1 TABLET (100 MG TOTAL) BY MOUTH DAILY. 01/27/16   [name], MD  traMADol (ULTRAM) 50 MG tablet TAKE 1 TABLET BY MOUTH EVERY 8 HOURS AS NEEDED FOR PAIN 10/21/15   [name], MD  triamcinolone ointment (KENALOG) 0.1 % Apply 1 application topically  2 (two) times daily. 10/14/15   Coral Spikes, DO     Allergies Patient has no known allergies.   Family History  Problem Relation Age of Onset  . Heart disease Father     Social History Social History  Substance Use Topics  . Smoking status: Former Smoker    Packs/day: 2.00    Years: 18.00    Types: Cigarettes    Quit date: 04/24/2005  . Smokeless tobacco: Never Used     Comment: quit 20 years ago   . Alcohol use 10.2 oz/week    3 Shots of liquor, 14 Standard drinks or equivalent per week     Comment: 1 a day    Review of Systems  Constitutional:   No fever or chills.  ENT:   No sore throat. No rhinorrhea. Cardiovascular:   No chest pain. Respiratory:   No dyspnea or cough. Gastrointestinal:   Left lower quadrant abdominal pain as above without vomiting and diarrhea.  Genitourinary:   Negative for dysuria or difficulty urinating. Musculoskeletal:   Negative for focal pain or swelling Neurological:   Negative for headaches 10-point ROS otherwise negative.  ____________________________________________   PHYSICAL EXAM:  VITAL SIGNS: ED Triage Vitals [04/29/16 1340]  Enc Vitals Group     BP (!) 141/86     Pulse Rate 99     Resp 20     Temp 98.8 F (37.1 C)     Temp Source Oral     SpO2 98 %     Weight  212 lb (96.2 kg)     Height 6' (1.829 m)     Head Circumference      Peak Flow      Pain Score 8     Pain Loc      Pain Edu?      Excl. in [place]?     Vital signs reviewed, nursing assessments reviewed.   Constitutional:   Alert and oriented. Well appearing and in no distress. Eyes:   No scleral icterus. No conjunctival pallor. PERRL. EOMI.  No nystagmus. ENT   Head:   Normocephalic and atraumatic.   Nose:   No congestion/rhinnorhea. No septal hematoma   Mouth/Throat:   MMM, no pharyngeal erythema. No peritonsillar mass.    Neck:   No stridor. No SubQ emphysema. No meningismus. Hematological/Lymphatic/Immunilogical:   No cervical lymphadenopathy. Cardiovascular:   RRR. Symmetric bilateral radial and DP pulses.  No murmurs.  Respiratory:   Normal respiratory effort without tachypnea nor retractions. Breath sounds are clear and equal bilaterally. No wheezes/rales/rhonchi. Gastrointestinal:   Soft With focal tenderness in the left lower quadrant.. Non distended. There is no CVA tenderness.  No rebound, rigidity, or guarding. Genitourinary:   Normal. No inguinal mass or tenderness. No palpable hernias. Musculoskeletal:   Nontender with normal range of motion in all extremities. No joint effusions.  No lower extremity tenderness.  No edema. Neurologic:   Normal speech and language.  CN 2-10 normal. Motor grossly intact. No gross focal neurologic deficits are appreciated.  Skin:    Skin is warm, dry and intact. No rash noted.  No petechiae, purpura, or bullae.  ____________________________________________    LABS (pertinent positives/negatives) (all labs ordered are listed, but only abnormal results are displayed) Labs Reviewed  COMPREHENSIVE METABOLIC PANEL - Abnormal; Notable for the following:       Result Value   Chloride 100 (*)    Glucose, Bld 115 (*)    BUN 22 (*)  All other components within normal limits  CBC - Abnormal; Notable for the following:    WBC  11.3 (*)    All other components within normal limits  URINALYSIS, COMPLETE (UACMP) WITH MICROSCOPIC - Abnormal; Notable for the following:    Color, Urine COLORLESS (*)    APPearance CLEAR (*)    Specific Gravity, Urine 1.004 (*)    All other components within normal limits  LIPASE, BLOOD   ____________________________________________   EKG    ____________________________________________    RADIOLOGY  CT abdomen and pelvis reveals uncomplicated sigmoid diverticulitis.  ____________________________________________   PROCEDURES Procedures  ____________________________________________   INITIAL IMPRESSION / ASSESSMENT AND PLAN / ED COURSE  Pertinent labs & imaging results that were available during my care of the patient were reviewed by me and considered in my medical decision making (see chart for details).  Patient well appearing no acute distress. Presents with left lower quadrant abdominal pain with severe tenderness in that area. We do CT scan given his unrevealing workup on labs. Highest differential diagnoses are ureterolithiasis and diverticulitis. Low suspicion for aneurysm dissection or mesenteric ischemia.     Clinical Course     ----------------------------------------- 5:37 PM on 04/29/2016 -----------------------------------------  Pain controlled. We'll discharge home with Cipro Flagyl and analgesics. Follow-up with primary care. ____________________________________________   FINAL CLINICAL IMPRESSION(S) / ED DIAGNOSES  Final diagnoses:  LLQ pain  Diverticulitis of large intestine without perforation or abscess without bleeding      New Prescriptions   CIPROFLOXACIN (CIPRO) 500 MG TABLET    Take 1 tablet (500 mg total) by mouth 2 (two) times daily.   METRONIDAZOLE (FLAGYL) 500 MG TABLET    Take 1 tablet (500 mg total) by mouth 3 (three) times daily.   NAPROXEN (NAPROSYN) 500 MG TABLET    Take 1 tablet (500 mg total) by mouth 2 (two) times  daily with a meal.   ONDANSETRON (ZOFRAN ODT) 4 MG DISINTEGRATING TABLET    Take 1 tablet (4 mg total) by mouth every 8 (eight) hours as needed for nausea or vomiting.   OXYCODONE-ACETAMINOPHEN (ROXICET) 5-325 MG TABLET    Take 1 tablet by mouth every 6 (six) hours as needed for severe pain.     Portions of this note were generated with dragon dictation software. Dictation errors may occur despite best attempts at proofreading.    Carrie Mew, MD 04/29/16 [phone]

## 2016-04-29 NOTE — ED Notes (Signed)
Notified CT scan that pt done with contrast

## 2016-04-29 NOTE — ED Triage Notes (Signed)
Pt c/o intermittent 8/10 LLQ abd/groin pain starting this morning. Took ibuprofen @ 1000. No c/o dysuria.

## 2016-04-29 NOTE — ED Notes (Signed)
Pt to CT scan.

## 2016-05-01 ENCOUNTER — Other Ambulatory Visit: Payer: Self-pay | Admitting: Internal Medicine

## 2016-05-02 ENCOUNTER — Encounter: Payer: Self-pay | Admitting: Emergency Medicine

## 2016-05-02 ENCOUNTER — Emergency Department
Admission: EM | Admit: 2016-05-02 | Discharge: 2016-05-02 | Disposition: A | Discharge status: Home / Self Care | Payer: BLUE CROSS/BLUE SHIELD | Attending: Student in an Organized Health Care Education/Training Program | Admitting: Student in an Organized Health Care Education/Training Program

## 2016-05-02 ENCOUNTER — Encounter: Payer: Self-pay | Admitting: Internal Medicine

## 2016-05-02 ENCOUNTER — Emergency Department: Payer: BLUE CROSS/BLUE SHIELD

## 2016-05-02 ENCOUNTER — Telehealth: Payer: Self-pay | Admitting: Internal Medicine

## 2016-05-02 DIAGNOSIS — R1032 Left lower quadrant pain: Secondary | ICD-10-CM

## 2016-05-02 DIAGNOSIS — Z96651 Presence of right artificial knee joint: Secondary | ICD-10-CM | POA: Diagnosis not present

## 2016-05-02 DIAGNOSIS — Z7982 Long term (current) use of aspirin: Secondary | ICD-10-CM | POA: Diagnosis not present

## 2016-05-02 DIAGNOSIS — I10 Essential (primary) hypertension: Secondary | ICD-10-CM | POA: Insufficient documentation

## 2016-05-02 DIAGNOSIS — E119 Type 2 diabetes mellitus without complications: Secondary | ICD-10-CM | POA: Insufficient documentation

## 2016-05-02 DIAGNOSIS — E039 Hypothyroidism, unspecified: Secondary | ICD-10-CM | POA: Insufficient documentation

## 2016-05-02 DIAGNOSIS — Z87891 Personal history of nicotine dependence: Secondary | ICD-10-CM | POA: Insufficient documentation

## 2016-05-02 DIAGNOSIS — K5732 Diverticulitis of large intestine without perforation or abscess without bleeding: Secondary | ICD-10-CM

## 2016-05-02 LAB — COMPREHENSIVE METABOLIC PANEL
ALBUMIN: 4.5 g/dL (ref 3.5–5.0)
ALT: 29 U/L (ref 17–63)
AST: 31 U/L (ref 15–41)
Alkaline Phosphatase: 84 U/L (ref 38–126)
Anion gap: 8 (ref 5–15)
BILIRUBIN TOTAL: 0.6 mg/dL (ref 0.3–1.2)
BUN: 17 mg/dL (ref 6–20)
CHLORIDE: 99 mmol/L — AB (ref 101–111)
CO2: 29 mmol/L (ref 22–32)
CREATININE: 0.93 mg/dL (ref 0.61–1.24)
Calcium: 9.6 mg/dL (ref 8.9–10.3)
GFR calc Af Amer: >60 mL/min (ref >60)
GLUCOSE: 130 mg/dL — AB (ref 65–99)
Potassium: 4.1 mmol/L (ref 3.5–5.1)
Sodium: 136 mmol/L (ref 135–145)
TOTAL PROTEIN: 7.8 g/dL (ref 6.5–8.1)

## 2016-05-02 LAB — CBC
HCT: 44.8 % (ref 40.0–52.0)
Hemoglobin: 15.3 g/dL (ref 13.0–18.0)
MCH: 31.5 pg (ref 26.0–34.0)
MCHC: 34.1 g/dL (ref 32.0–36.0)
MCV: 92.2 fL (ref 80.0–100.0)
PLATELETS: 298 10*3/uL (ref 150–440)
RBC: 4.86 MIL/uL (ref 4.40–5.90)
RDW: 12.7 % (ref 11.5–14.5)
WBC: 5.9 10*3/uL (ref 3.8–10.6)

## 2016-05-02 LAB — URINALYSIS, COMPLETE (UACMP) WITH MICROSCOPIC
BACTERIA UA: NONE SEEN
Bilirubin Urine: NEGATIVE
GLUCOSE, UA: NEGATIVE mg/dL
Hgb urine dipstick: NEGATIVE
KETONES UR: NEGATIVE mg/dL
LEUKOCYTES UA: NEGATIVE
Nitrite: NEGATIVE
PH: 6 (ref 5.0–8.0)
Protein, ur: NEGATIVE mg/dL
SPECIFIC GRAVITY, URINE: 1.005 (ref 1.005–1.030)
SQUAMOUS EPITHELIAL / LPF: NONE SEEN

## 2016-05-02 LAB — LIPASE, BLOOD: Lipase: 19 U/L (ref 11–51)

## 2016-05-02 MED ORDER — OXYCODONE-ACETAMINOPHEN 5-325 MG PO TABS
ORAL_TABLET | ORAL | Status: AC
  Filled 2016-05-02: qty 1

## 2016-05-02 MED ORDER — PROMETHAZINE HCL 25 MG/ML IJ SOLN
12.5000 mg | Freq: Once | INTRAMUSCULAR | Status: AC
  Administered 2016-05-02: 12.5 mg via INTRAVENOUS
  Filled 2016-05-02 (×2): qty 1

## 2016-05-02 MED ORDER — HYDROCODONE-ACETAMINOPHEN 5-325 MG PO TABS
1.0000 | ORAL_TABLET | Freq: Once | ORAL | Status: AC
  Administered 2016-05-02: 1 via ORAL
  Filled 2016-05-02: qty 1

## 2016-05-02 MED ORDER — OXYCODONE-ACETAMINOPHEN 5-325 MG PO TABS
1.0000 | ORAL_TABLET | ORAL | Status: DC | PRN
  Administered 2016-05-02: 1 via ORAL

## 2016-05-02 MED ORDER — PROMETHAZINE HCL 12.5 MG PO TABS: 12.5000 mg | ORAL_TABLET | Freq: Three times a day (TID) | ORAL | 0 refills | Status: DC | PRN

## 2016-05-02 MED ORDER — POLYETHYLENE GLYCOL 3350 17 G PO PACK: 17.0000 g | PACK | Freq: Every day | ORAL | 0 refills | Status: DC

## 2016-05-02 MED ORDER — POLYETHYLENE GLYCOL 3350 17 G PO PACK
PACK | ORAL | Status: AC
  Filled 2016-05-02: qty 1

## 2016-05-02 MED ORDER — SODIUM CHLORIDE 0.9 % IV BOLUS (SEPSIS)
1000.0000 mL | Freq: Once | INTRAVENOUS | Status: AC
  Administered 2016-05-02: 1000 mL via INTRAVENOUS

## 2016-05-02 MED ORDER — IOPAMIDOL (ISOVUE-300) INJECTION 61%
100.0000 mL | Freq: Once | INTRAVENOUS | Status: AC | PRN
  Administered 2016-05-02: 100 mL via INTRAVENOUS
  Filled 2016-05-02: qty 100

## 2016-05-02 MED ORDER — MORPHINE SULFATE (PF) 4 MG/ML IV SOLN
4.0000 mg | INTRAVENOUS | Status: DC | PRN
  Administered 2016-05-02: 4 mg via INTRAVENOUS
  Filled 2016-05-02: qty 1

## 2016-05-02 MED ORDER — POLYETHYLENE GLYCOL 3350 17 G PO PACK
17.0000 g | PACK | Freq: Every day | ORAL | Status: DC
  Administered 2016-05-02: 17 g via ORAL

## 2016-05-02 NOTE — Telephone Encounter (Signed)
CT report as follows:  1. Acute diverticulitis involving the sigmoid colon. No evidence of perforation or abscess. 2. 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 3. Mild median lobe prostate gland enlargement likely indicating early BPH.  WBC 11.3 Patient placed on Cipro 500mg  BID and  Flaygl 500 mg TID,  No probtotic.

## 2016-05-02 NOTE — Telephone Encounter (Signed)
CLEAR LIQUID DIET.  BOWELS NEED TO MOVE DAILY TO PREVENT MORE PAIN FORM CONSTIPATION.  CAN USE MIRALAX/STOOL SOFTENERS TO AID BOWELS.  I WILL CALL IN PHENERGAM FOR NAUSEA.   IF HE SPIKES A FEVER OF 101 OR HIGHER, IN PRESENCE OF SEVER PAIN,  NEEDS TO RETURN TO ER FOR URGENT EVALUATION TO MAKE SURE HE HAS NOT PERFORATED

## 2016-05-02 NOTE — ED Provider Notes (Signed)
[facility] Emergency Department Provider Note    First MD Initiated Contact with Patient 05/02/16 1947     (approximate)  I have reviewed the triage vital signs and the nursing notes.   HISTORY  Chief Complaint Abdominal Pain    HPI [name] is a 51 y.o. male patient with recent diagnosis of, located diverticulitis seen on the sixth of this month and discharged on Cipro Flagyl and symptomatically management we presents today due to worsening abdominal pain that occurs any time he is up walking or moving. States the pain is okay when he is laying down or sitting. States that he got up to try to get food and his medications and had severe 10 out of 10 pain. Pain is reproducible with palpation to left lower abdomen. Has had normal stools. No blood. Is feeling nauseated without any vomiting. No fevers or chills. He is been compliant with his antibiotics.   Past Medical History:  Diagnosis Date  . Anxiety   . Anxiety and depression   . Bruit    L  . Chest pain    hx  . Diabetes mellitus without complication ([place])    "borderline"  . Edema   . Fatty liver   . GERD (gastroesophageal reflux disease)    uses Omeprazole  . Goiter   . HLD (hyperlipidemia)   . Hypertension    essential, benign  . Hypothyroidism   . Impotence of organic origin   . Murmur   . Other chest pain    tightness, pressure  . Palpitation    hx  . Precordial pain   . Sleep apnea    uses cpap   Family History  Problem Relation Age of Onset  . Heart disease Father    Past Surgical History:  Procedure Laterality Date  . APPENDECTOMY  2002   done at [facility]  . CARDIAC CATHETERIZATION    . CARDIAC CATHETERIZATION N/A 08/27/2015   Procedure: Left Heart Cath and Coronary Angiography;  Surgeon: Jolaine Artist, MD;  Location: [facility];  Service: Cardiovascular;  Laterality: N/A;  . COLONOSCOPY    . HERNIA REPAIR  [id number]   umbilical  . JOINT REPLACEMENT     right knee  . REVISION TOTAL KNEE ARTHROPLASTY Right 06/23/2014   DR Marlou Sa  . TONSILLECTOMY    . TOTAL KNEE ARTHROPLASTY Right 2011   right  . TOTAL KNEE REVISION Right 06/23/2014   Procedure: TOTAL KNEE REVISION;  Surgeon: Meredith Pel, MD;  Location: [place];  Service: Orthopedics;  Laterality: Right;   Patient Active Problem List   Diagnosis Date Noted  . Viral URI 03/31/2016  . 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 Hypothyroidism due to acquired atrophy of thyroid 09/14/2015  . Flushing reaction 03/30/2015  . Arthritis of knee 06/23/2014  . Pre-operative cardiovascular examination 06/17/2014  . Enlarged RV (right ventricle) 06/17/2014  . Cervical stenosis of spine 02/17/2014  . Overweight 10/29/2013  . Chronic knee pain 10/29/2013  . Umbilical hernia [id number]  . Diabetes mellitus type 2 in obese ([place]) 10/23/2012  . Seborrheic dermatitis, unspecified 05/17/2012  . Fatty liver 04/22/2012  . Polyarthralgia 12/24/2010  . Sciatica of right side 12/24/2010  . Hyperlipidemia associated with type 2 diabetes mellitus ([place]) 01/13/2009  . ANXIETY DEPRESSION 01/13/2009  . Essential hypertension 01/13/2009  . OSA (obstructive sleep apnea) 01/13/2009      Prior to Admission medications   Medication Sig Start Date  End Date Taking? Authorizing Provider  ALPRAZolam Duanne Moron) 0.5 MG tablet TAKE 1 TABLET BY MOUTH 2 TIMES DAILY AS NEEDED FOR ANXIETY 04/19/16   [name], MD  aspirin EC 81 MG tablet Take 81 mg by mouth daily.    Historical Provider, MD  BAYER MICROLET LANCETS lancets Use as instructed 05/30/13   [name], MD  buPROPion [facility] SR) 150 MG 12 hr tablet TAKE 1 TABLET BY MOUTH TWICE A DAY (NEEDS OFFICE VISIT) 03/22/16   [name], MD  ciprofloxacin (CIPRO) 500 MG tablet Take 1 tablet (500 mg total) by mouth 2 (two) times daily. 04/29/16   Carrie Mew, MD  Clobetasol Propionate (TEMOVATE) 0.05 % external spray Apply topically 2 (two)  times daily. 03/30/15   [name], MD  Clobetasol Propionate 0.05 % shampoo Apply to dry scalp and leave on for 15 minutes 03/30/15   [name], MD  dicyclomine (BENTYL) 20 MG tablet TAKE 1 TABLET (20 MG TOTAL) BY MOUTH EVERY 6 (SIX) HOURS. 12/21/15   [name], MD  gabapentin (NEURONTIN) 100 MG capsule TAKE 1 CAPSULE (100 MG TOTAL) BY MOUTH 3 (THREE) TIMES DAILY.**NEEDS OFFICE VISIT** 09/07/15   [name], MD  ibuprofen (ADVIL,MOTRIN) 200 MG tablet Take 400 mg by mouth every 6 (six) hours as needed (pain).    Historical Provider, MD  levothyroxine (SYNTHROID, LEVOTHROID) 88 MCG tablet TAKE 1 TABLET BY MOUTH DAILY BEFORE BREAKFAST. 01/10/16   [name], MD  lisinopril-hydrochlorothiazide (PRINZIDE,ZESTORETIC) 20-25 MG tablet TAKE 1 TABLET BY MOUTH DAILY. 05/01/16   [name], MD  meloxicam (MOBIC) 15 MG tablet Take 1 tablet (15 mg total) by mouth daily. For joint pain 03/30/15   [name], MD  methocarbamol (ROBAXIN) 500 MG tablet TAKE 1 TABLET BY MOUTH EVERY 8 HOURS AS NEEDED 05/17/15   [name], MD  methocarbamol (ROBAXIN) 500 MG tablet TAKE 1 TABLET BY MOUTH EVERY 8 HOURS 04/19/16   [name], MD  metoprolol tartrate (LOPRESSOR) 25 MG tablet TAKE 1 TABLET BY MOUTH TWICE A DAY 02/29/16   [name], MD  metroNIDAZOLE (FLAGYL) 500 MG tablet Take 1 tablet (500 mg total) by mouth 3 (three) times daily. 04/29/16   Carrie Mew, MD  Multiple Vitamin (MULTIVITAMIN WITH MINERALS) TABS tablet Take 1 tablet by mouth daily.    Historical Provider, MD  naproxen (NAPROSYN) 500 MG tablet Take 1 tablet (500 mg total) by mouth 2 (two) times daily with a meal. 04/29/16   Carrie Mew, MD  omeprazole (PRILOSEC) 40 MG capsule TAKE 1 CAPSULE (40 MG TOTAL) BY MOUTH 2 (TWO) TIMES DAILY. 02/24/16   [name], MD  ondansetron (ZOFRAN ODT) 4 MG disintegrating tablet Take 1 tablet (4 mg total) by mouth every 8 (eight) hours as needed for nausea or vomiting. 04/29/16   Carrie Mew, MD  oxyCODONE-acetaminophen (ROXICET) 5-325 MG tablet Take 1 tablet by mouth every 6 (six) hours as needed for severe pain. 04/29/16   Carrie Mew, MD  Polyethyl Glycol-Propyl Glycol (SYSTANE OP) Place 1 drop into both eyes daily as needed (burning/irritation). Reported on 10/14/2015    Historical Provider, MD  promethazine (PHENERGAN) 12.5 MG tablet Take 1 tablet (12.5 mg total) by mouth every 8 (eight) hours as needed for nausea or vomiting. 05/02/16   [name], MD  rosuvastatin (CRESTOR) 20 MG tablet TAKE 1 TABLET (20 MG TOTAL) BY MOUTH DAILY. 01/04/16   [name], MD  sertraline (ZOLOFT) 100 MG tablet TAKE 1 TABLET (100 MG TOTAL) BY MOUTH DAILY. 01/27/16   [name], MD  traMADol (ULTRAM) 50 MG tablet TAKE 1 TABLET BY MOUTH EVERY 8 HOURS AS NEEDED FOR PAIN 10/21/15   [name], MD  triamcinolone ointment (KENALOG) 0.1 % Apply 1 application topically 2 (two) times daily. 10/14/15   Coral Spikes, DO    Allergies Patient has no known allergies.    Social History Social History  Substance Use Topics  . Smoking status: Former Smoker    Packs/day: 2.00    Years: 18.00    Types: Cigarettes    Quit date: 04/24/2005  . Smokeless tobacco: Never Used     Comment: quit 20 years ago   . Alcohol use 10.2 oz/week    3 Shots of liquor, 14 Standard drinks or equivalent per week     Comment: 1 a day    Review of Systems Patient denies headaches, rhinorrhea, blurry vision, numbness, shortness of breath, chest pain, edema, cough, abdominal pain, nausea, vomiting, diarrhea, dysuria, fevers, rashes or hallucinations unless otherwise stated above in HPI. ____________________________________________   PHYSICAL EXAM:  VITAL SIGNS: Vitals:   05/02/16 2004 05/02/16 2148  BP: (!) 158/120 (!) 172/85  Pulse: 97 77  Resp: 18 18  Temp:      Constitutional: Alert and oriented. Well appearing and in no acute distress. Eyes: Conjunctivae are normal. PERRL. EOMI. Head:  Atraumatic. Nose: No congestion/rhinnorhea. Mouth/Throat: Mucous membranes are moist.  Oropharynx non-erythematous. Neck: No stridor. Painless ROM. No cervical spine tenderness to palpation Hematological/Lymphatic/Immunilogical: No cervical lymphadenopathy. Cardiovascular: Normal rate, regular rhythm. Grossly normal heart sounds.  Good peripheral circulation. Respiratory: Normal respiratory effort.  No retractions. Lungs CTAB. Gastrointestinal: Soft with acute TTP to LLQ, no diffuse peritonitis. No distention. No abdominal bruits. No CVA tenderness. Musculoskeletal: No lower extremity tenderness nor edema.  No joint effusions. Neurologic:  Normal speech and language. No gross focal neurologic deficits are appreciated. No gait instability. Skin:  Skin is warm, dry and intact. No rash noted. Psychiatric: Mood and affect are normal. Speech and behavior are normal.  ____________________________________________   LABS (all labs ordered are listed, but only abnormal results are displayed)  Results for orders placed or performed during the hospital encounter of 05/02/16 (from the past 24 hour(s))  Lipase, blood     Status: None   Collection Time: 05/02/16  3:19 PM  Result Value Ref Range   Lipase 19 11 - 51 U/L  Comprehensive metabolic panel     Status: Abnormal   Collection Time: 05/02/16  3:19 PM  Result Value Ref Range   Sodium 136 135 - 145 mmol/L   Potassium 4.1 3.5 - 5.1 mmol/L   Chloride 99 (L) 101 - 111 mmol/L   CO2 29 22 - 32 mmol/L   Glucose, Bld 130 (H) 65 - 99 mg/dL   BUN 17 6 - 20 mg/dL   Creatinine, Ser 0.93 0.61 - 1.24 mg/dL   Calcium 9.6 8.9 - 10.3 mg/dL   Total Protein 7.8 6.5 - 8.1 g/dL   Albumin 4.5 3.5 - 5.0 g/dL   AST 31 15 - 41 U/L   ALT 29 17 - 63 U/L   Alkaline Phosphatase 84 38 - 126 U/L   Total Bilirubin 0.6 0.3 - 1.2 mg/dL   GFR calc non Af Amer >60 >60 mL/min   GFR calc Af Amer >60 >60 mL/min   Anion gap 8 5 - 15  CBC  Status: None   Collection  Time: 05/02/16  3:19 PM  Result Value Ref Range   WBC 5.9 3.8 - 10.6 K/uL   RBC 4.86 4.40 - 5.90 MIL/uL   Hemoglobin 15.3 13.0 - 18.0 g/dL   HCT 44.8 40.0 - 52.0 %   MCV 92.2 80.0 - 100.0 fL   MCH 31.5 26.0 - 34.0 pg   MCHC 34.1 32.0 - 36.0 g/dL   RDW 12.7 11.5 - 14.5 %   Platelets 298 150 - 440 K/uL  Urinalysis, Complete w Microscopic     Status: Abnormal   Collection Time: 05/02/16  3:19 PM  Result Value Ref Range   Color, Urine STRAW (A) YELLOW   APPearance CLEAR (A) CLEAR   Specific Gravity, Urine 1.005 1.005 - 1.030   pH 6.0 5.0 - 8.0   Glucose, UA NEGATIVE NEGATIVE mg/dL   Hgb urine dipstick NEGATIVE NEGATIVE   Bilirubin Urine NEGATIVE NEGATIVE   Ketones, ur NEGATIVE NEGATIVE mg/dL   Protein, ur NEGATIVE NEGATIVE mg/dL   Nitrite NEGATIVE NEGATIVE   Leukocytes, UA NEGATIVE NEGATIVE   RBC / HPF 0-5 0 - 5 RBC/hpf   WBC, UA 0-5 0 - 5 WBC/hpf   Bacteria, UA NONE SEEN NONE SEEN   Squamous Epithelial / LPF NONE SEEN NONE SEEN   ____________________________________________ ____________________________________________  RADIOLOGY  I personally reviewed all radiographic images ordered to evaluate for the above acute complaints and reviewed radiology reports and findings.  These findings were personally discussed with the patient.  Please see medical record for radiology report.  ____________________________________________   PROCEDURES  Procedure(s) performed:  Procedures    Critical Care performed: no ____________________________________________   INITIAL IMPRESSION / ASSESSMENT AND PLAN / ED COURSE  Pertinent labs & imaging results that were available during my care of the patient were reviewed by me and considered in my medical decision making (see chart for details).  DDX: diverticulitits, abscess, perf, colitis, constipation  [name] is a 51 y.o. who presents to the ED with left lower quadrant pain status post recent diagnosis of diverticulitis.  Patient is afebrile hemodynamically stable and appears well-perfused but is quite tender to palpation left lower quadrant. No leukocytosis or fever but patient has been on antibiotics. Based on the worsening pain I will order CT imaging to evaluate for any formation of abscess or perforation.  The patient will be placed on continuous pulse oximetry and telemetry for monitoring.  Laboratory evaluation will be sent to evaluate for the above complaints.     Clinical Course as of May 02 2345  Tue May 02, 2016  2124 CT imaging is without any evidence of perforation or abscess. It appears that the diverticular inflammation is improving. Blood work is otherwise reassuring. Patient states pain has resolved after pain medication. Requesting something to eat. He feel patient is stable for further outpatient management given his reassuring evaluation here with good outpatient follow-up.  Patient was able to tolerate PO and was able to ambulate with a steady gait.  Have discussed with the patient and available family all diagnostics and treatments performed thus far and all questions were answered to the best of my ability. The patient demonstrates understanding and agreement with plan.   [PR]    Clinical Course User Index [PR] Merlyn Lot, MD     ____________________________________________   FINAL CLINICAL IMPRESSION(S) / ED DIAGNOSES  Final diagnoses:  Left lower quadrant pain  Diverticulitis of large intestine without perforation or abscess without bleeding  NEW MEDICATIONS STARTED DURING THIS VISIT:  New Prescriptions   No medications on file     Note:  This document was prepared using Dragon voice recognition software and may include unintentional dictation errors.    Merlyn Lot, MD 05/02/16 [phone]

## 2016-05-02 NOTE — ED Triage Notes (Signed)
Patient presents to the ED with left lower quadrant pain since Saturday.  Patient was seen in the ED on 1/6 and was diagnosed with diverticulitis.  Patient was given prescription for flagyl, cipro, percocet, naproxen, and zofran.  Patient states pain is tolerable when sitting or standing but lying down pain is severe.  Patient states he was concerned because although the pain medication helps his pain is severe without the pain medication and he thought it should be better by now.  Patient called PCP and was not able to make an appointment and was instructed to come back to the ED if pain continued.

## 2016-05-02 NOTE — ED Notes (Signed)
Pt discharged to home.  Friend driving.  Discharge instructions reviewed.  Verbalized understanding.  No questions or concerns at this time.  Teach back verified.  Pt in NAD.  No items left in ED.   

## 2016-05-02 NOTE — ED Notes (Signed)
Pt states that he has PM dose of BP medications to take when he gets home.  EDP notified.

## 2016-05-02 NOTE — Telephone Encounter (Signed)
Pt called and stated that he went to the ED on Saturday c/o lower left abdominal pain, they did a CT scan and found that he has diverticulitis. They gave him metroNIDAZOLE (FLAGYL) 500 MG tablet, and ciprofloxacin (CIPRO) 500 MG tablet and since then he is complain of nausea, continuing lower left abdominal pain, when he stands and walks it is worse, better when he lays down. Please advise, thank you!  Call pt @ [phone]

## 2016-05-02 NOTE — Telephone Encounter (Signed)
Patient notified and voiced understanding.

## 2016-05-02 NOTE — Telephone Encounter (Signed)
Spoke with patient he was in ED states  Left lower abdominal pain became unbearable and went back to ED . He had already spoken with Juliann Pulse in office.

## 2016-05-02 NOTE — ED Notes (Signed)
Pt states that the pain is worse with walking and sitting straight up.

## 2016-05-17 ENCOUNTER — Telehealth: Payer: Self-pay | Admitting: Radiology

## 2016-05-17 ENCOUNTER — Other Ambulatory Visit: Payer: Self-pay | Admitting: Internal Medicine

## 2016-05-17 DIAGNOSIS — F411 Generalized anxiety disorder: Secondary | ICD-10-CM

## 2016-05-17 NOTE — Telephone Encounter (Signed)
Please disregard last telephone note, routed to [place] in [place].

## 2016-05-17 NOTE — Telephone Encounter (Signed)
Pt last refill for his Gabapentin was on 04/18/16 and last refill on Zoloft was on 04/20/16. Pt last OV to address issues were on 09/13/15. Pt has no future scheduled appts at this time. Need office visit before refilling?

## 2016-05-17 NOTE — Telephone Encounter (Signed)
No will refill or 30 days .  Please set up.

## 2016-05-17 NOTE — Telephone Encounter (Signed)
Can you please make appt for pt for next set of refill? Thank you.

## 2016-05-17 NOTE — Telephone Encounter (Signed)
Can you please make appt for pt for next set of refills?

## 2016-06-01 ENCOUNTER — Other Ambulatory Visit: Payer: Self-pay | Admitting: Internal Medicine

## 2016-06-01 NOTE — Telephone Encounter (Signed)
Last OV  09/13/15 Next OV 06/05/16 LF methocarbamol 04/19/16 # 90 w/ 0 Rf.  Ok to Rf?

## 2016-06-05 ENCOUNTER — Encounter: Payer: Self-pay | Admitting: Internal Medicine

## 2016-06-05 ENCOUNTER — Ambulatory Visit (INDEPENDENT_AMBULATORY_CARE_PROVIDER_SITE_OTHER): Payer: BLUE CROSS/BLUE SHIELD | Admitting: Internal Medicine

## 2016-06-05 VITALS — BP 150/80 | HR 82 | Resp 16 | Wt 213.0 lb

## 2016-06-05 DIAGNOSIS — E785 Hyperlipidemia, unspecified: Secondary | ICD-10-CM | POA: Diagnosis not present

## 2016-06-05 DIAGNOSIS — I10 Essential (primary) hypertension: Secondary | ICD-10-CM | POA: Diagnosis not present

## 2016-06-05 DIAGNOSIS — E038 Other specified hypothyroidism: Secondary | ICD-10-CM | POA: Diagnosis not present

## 2016-06-05 DIAGNOSIS — F411 Generalized anxiety disorder: Secondary | ICD-10-CM | POA: Diagnosis not present

## 2016-06-05 DIAGNOSIS — M75122 Complete rotator cuff tear or rupture of left shoulder, not specified as traumatic: Secondary | ICD-10-CM

## 2016-06-05 DIAGNOSIS — Z79899 Other long term (current) drug therapy: Secondary | ICD-10-CM | POA: Diagnosis not present

## 2016-06-05 DIAGNOSIS — K760 Fatty (change of) liver, not elsewhere classified: Secondary | ICD-10-CM

## 2016-06-05 DIAGNOSIS — E063 Autoimmune thyroiditis: Secondary | ICD-10-CM | POA: Diagnosis not present

## 2016-06-05 DIAGNOSIS — E669 Obesity, unspecified: Secondary | ICD-10-CM

## 2016-06-05 DIAGNOSIS — E1169 Type 2 diabetes mellitus with other specified complication: Secondary | ICD-10-CM | POA: Diagnosis not present

## 2016-06-05 DIAGNOSIS — E034 Atrophy of thyroid (acquired): Secondary | ICD-10-CM

## 2016-06-05 MED ORDER — DICYCLOMINE HCL 20 MG PO TABS: 20.0000 mg | ORAL_TABLET | Freq: Four times a day (QID) | ORAL | 5 refills | Status: DC

## 2016-06-05 MED ORDER — GABAPENTIN 100 MG PO CAPS: ORAL_CAPSULE | ORAL | 5 refills | Status: DC

## 2016-06-05 MED ORDER — ROSUVASTATIN CALCIUM 20 MG PO TABS: 20.0000 mg | ORAL_TABLET | Freq: Every day | ORAL | 2 refills | Status: DC

## 2016-06-05 MED ORDER — ONDANSETRON 4 MG PO TBDP: 4.0000 mg | ORAL_TABLET | Freq: Three times a day (TID) | ORAL | 5 refills | Status: DC | PRN

## 2016-06-05 MED ORDER — CLOBETASOL PROPIONATE 0.05 % EX LIQD: Freq: Two times a day (BID) | CUTANEOUS | 5 refills | Status: DC

## 2016-06-05 MED ORDER — METOPROLOL TARTRATE 25 MG PO TABS: 25.0000 mg | ORAL_TABLET | Freq: Two times a day (BID) | ORAL | 5 refills | Status: DC

## 2016-06-05 MED ORDER — CLOBETASOL PROPIONATE 0.05 % EX SHAM: MEDICATED_SHAMPOO | CUTANEOUS | 5 refills | Status: DC

## 2016-06-05 MED ORDER — LISINOPRIL-HYDROCHLOROTHIAZIDE 20-25 MG PO TABS
1.0000 | ORAL_TABLET | Freq: Every day | ORAL | 5 refills | Status: DC
Start: 2016-06-05 — End: 2017-02-01

## 2016-06-05 MED ORDER — PROMETHAZINE HCL 12.5 MG PO TABS: 12.5000 mg | ORAL_TABLET | Freq: Three times a day (TID) | ORAL | 0 refills | Status: DC | PRN

## 2016-06-05 MED ORDER — METHOCARBAMOL 500 MG PO TABS: 500.0000 mg | ORAL_TABLET | Freq: Three times a day (TID) | ORAL | 5 refills | Status: DC | PRN

## 2016-06-05 MED ORDER — ALPRAZOLAM 0.5 MG PO TABS: 0.5000 mg | ORAL_TABLET | Freq: Two times a day (BID) | ORAL | 5 refills | Status: DC | PRN

## 2016-06-05 MED ORDER — OMEPRAZOLE 40 MG PO CPDR: 40.0000 mg | DELAYED_RELEASE_CAPSULE | Freq: Two times a day (BID) | ORAL | 6 refills | Status: DC

## 2016-06-05 MED ORDER — TRAMADOL HCL 50 MG PO TABS: 50.0000 mg | ORAL_TABLET | Freq: Three times a day (TID) | ORAL | 0 refills | Status: DC | PRN

## 2016-06-05 MED ORDER — SERTRALINE HCL 100 MG PO TABS: 100.0000 mg | ORAL_TABLET | Freq: Every day | ORAL | 0 refills | Status: DC

## 2016-06-05 MED ORDER — BUPROPION HCL ER (SR) 150 MG PO TB12: 150.0000 mg | ORAL_TABLET | Freq: Two times a day (BID) | ORAL | 5 refills | Status: DC

## 2016-06-05 NOTE — Progress Notes (Signed)
Pre visit review using our clinic review tool, if applicable. No additional management support is needed unless otherwise documented below in the visit note. 

## 2016-06-05 NOTE — Progress Notes (Signed)
Subjective:  Patient ID: [name], male    DOB: [birth year]  Age: 51 y.o. MRN: [id number]  CC: The primary encounter diagnosis was Hyperlipidemia associated with type 2 diabetes mellitus ([place]). Diagnoses of Generalized anxiety disorder, Essential hypertension, Diabetes mellitus type 2 in obese [facility]), Long-term use of high-risk medication, Hypothyroidism due to Hashimoto's thyroiditis, Hypothyroidism due to acquired atrophy of thyroid, Complete rotator cuff tear of left shoulder, Anxiety state, and Fatty liver were also pertinent to this visit.  HPI [name] presents for follow up on multiple issues including GAD HTN, hypothyroidism, obesity with OSA and Type 2 DM .   he has had an intentional weight loss of 3 lbs since last visit, and 23 lbs since diagnosis in 2014 by reducing the sugar in his diet.  Hhe has been keeping her carbohydrate content well under the recommended  45 carbs per meal. He is not checking his blood sugars.    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 His shoulder struck the desk and chair. The injury occurred on October 4 and he reported the injury.  He has been receiving treatment that through workmen's comp. Had PT for several months without imaging, and when  the arm pain and loss of strength did not improve,  imaging was finally allowed and an MRi noted rotator cuff tear and a biceps tear.  He underwent corrective surgery of both tears on Jan 20 at [place] by [facility]. His  Pain is being managed by the orthopedist PA. He has been out of work since October and is concerned that his job is in jeopardy.  He was told recently that he was expected to return to work on March 1.  He is very anxious about the potential loss of income, since he has a wife to support along with a teenage son.  He has hired an Editor, commissioning Value Date   MICROALBUR <0.7 09/13/2015     Lab Results  Component Value Date   HGBA1C 6.0 09/13/2015     Outpatient Medications Prior to Visit  Medication Sig Dispense Refill  . aspirin EC 81 MG tablet Take 81 mg by mouth daily.    Marland Kitchen BAYER MICROLET LANCETS lancets Use as instructed 100 each 12  . ibuprofen (ADVIL,MOTRIN) 200 MG tablet Take 400 mg by mouth every 6 (six) hours as needed (pain).    Marland Kitchen levothyroxine (SYNTHROID, LEVOTHROID) 88 MCG tablet TAKE 1 TABLET BY MOUTH DAILY BEFORE BREAKFAST. 30 tablet 4  . Multiple Vitamin (MULTIVITAMIN WITH MINERALS) TABS tablet Take 1 tablet by mouth daily.    . naproxen (NAPROSYN) 500 MG tablet Take 1 tablet (500 mg total) by mouth 2 (two) times daily with a meal. 20 tablet 0  . oxyCODONE-acetaminophen (ROXICET) 5-325 MG tablet Take 1 tablet by mouth every 6 (six) hours as needed for severe pain. 10 tablet 0  . Polyethyl Glycol-Propyl Glycol (SYSTANE OP) Place 1 drop into both eyes daily as needed (burning/irritation). Reported on 10/14/2015    . ALPRAZolam (XANAX) 0.5 MG tablet TAKE 1 TABLET BY MOUTH 2 TIMES DAILY AS NEEDED FOR ANXIETY 45 tablet 0  . buPROPion (WELLBUTRIN SR) 150 MG 12 hr tablet TAKE 1 TABLET BY MOUTH TWICE A DAY (NEEDS OFFICE VISIT) 60 tablet 2  .  ciprofloxacin (CIPRO) 500 MG tablet Take 1 tablet (500 mg total) by mouth 2 (two) times daily. 14 tablet 0  . Clobetasol Propionate (TEMOVATE) 0.05 % external spray Apply topically 2 (two) times daily. 125 mL 5  . Clobetasol Propionate 0.05 % shampoo Apply to dry scalp and leave on for 15 minutes 118 mL 5  . dicyclomine (BENTYL) 20 MG tablet TAKE 1 TABLET (20 MG TOTAL) BY MOUTH EVERY 6 (SIX) HOURS. 120 tablet 5  . gabapentin (NEURONTIN) 100 MG capsule TAKE ONE CAPSULE BY MOUTH 3 TIMES A DAY (NEEDS OFFICE VISIT) 90 capsule 0  . lisinopril-hydrochlorothiazide (PRINZIDE,ZESTORETIC) 20-25 MG tablet TAKE 1 TABLET BY MOUTH DAILY. 30 tablet 2  . methocarbamol (ROBAXIN) 500  MG tablet TAKE 1 TABLET BY MOUTH EVERY 8 HOURS AS NEEDED 90 tablet 0  . methocarbamol (ROBAXIN) 500 MG tablet TAKE 1 TABLET BY MOUTH EVERY 8 HOURS 90 tablet 0  . metoprolol tartrate (LOPRESSOR) 25 MG tablet TAKE 1 TABLET BY MOUTH TWICE A DAY 60 tablet 2  . metroNIDAZOLE (FLAGYL) 500 MG tablet Take 1 tablet (500 mg total) by mouth 3 (three) times daily. 30 tablet 0  . omeprazole (PRILOSEC) 40 MG capsule TAKE 1 CAPSULE (40 MG TOTAL) BY MOUTH 2 (TWO) TIMES DAILY. 60 capsule 6  . ondansetron (ZOFRAN ODT) 4 MG disintegrating tablet Take 1 tablet (4 mg total) by mouth every 8 (eight) hours as needed for nausea or vomiting. 20 tablet 0  . promethazine (PHENERGAN) 12.5 MG tablet Take 1 tablet (12.5 mg total) by mouth every 8 (eight) hours as needed for nausea or vomiting. 20 tablet 0  . rosuvastatin (CRESTOR) 20 MG tablet TAKE 1 TABLET (20 MG TOTAL) BY MOUTH DAILY. 90 tablet 2  . sertraline (ZOLOFT) 100 MG tablet TAKE 1 TABLET (100 MG TOTAL) BY MOUTH DAILY. 30 tablet 0  . traMADol (ULTRAM) 50 MG tablet TAKE 1 TABLET BY MOUTH EVERY 8 HOURS AS NEEDED FOR PAIN 90 tablet 3  . meloxicam (MOBIC) 15 MG tablet Take 1 tablet (15 mg total) by mouth daily. For joint pain (Patient not taking: Reported on 06/05/2016) 30 tablet 2  . polyethylene glycol (MIRALAX / GLYCOLAX) packet Take 17 g by mouth daily. Mix one tablespoon with 8oz of your favorite juice or water every day until you are having soft formed stools. Then start taking once daily if you didn't have a stool the day before. (Patient not taking: Reported on 06/05/2016) 30 each 0  . triamcinolone ointment (KENALOG) 0.1 % Apply 1 application topically 2 (two) times daily. (Patient not taking: Reported on 06/05/2016) 30 g 0   No facility-administered medications prior to visit.     Review of Systems;  Patient denies headache, fevers, malaise, unintentional weight loss, skin rash, eye pain, sinus congestion and sinus pain, sore throat, dysphagia,  hemoptysis ,  cough, dyspnea, wheezing, chest pain, palpitations, orthopnea, edema, abdominal pain, nausea, melena, diarrhea, constipation, flank pain, dysuria, hematuria, urinary  Frequency, nocturia, numbness, tingling, seizures,  Focal weakness, Loss of consciousness,  Tremor, insomnia, depression, anxiety, and suicidal ideation.      Objective:  BP (!) 150/80   Pulse 82   Resp 16   Wt 213 lb (96.6 kg)   SpO2 97%   BMI 28.89 kg/m   BP Readings from Last 3 Encounters:  06/05/16 (!) 150/80  05/02/16 (!) 172/85  04/29/16 (!) 164/86    Wt Readings from Last 3 Encounters:  06/05/16 213 lb (96.6 kg)  05/02/16 210 lb (95.3  kg)  04/29/16 212 lb (96.2 kg)    General appearance: alert, cooperative and appears stated age Ears: normal TM's and external ear canals both ears Throat: lips, mucosa, and tongue normal; teeth and gums normal Neck: no adenopathy, no carotid bruit, supple, symmetrical, trachea midline and thyroid not enlarged, symmetric, no tenderness/mass/nodules Back: symmetric, no curvature. ROM normal. No CVA tenderness. Lungs: clear to auscultation bilaterally Heart: regular rate and rhythm, S1, S2 normal, no murmur, click, rub or gallop Abdomen: soft, non-tender; bowel sounds normal; no masses,  no organomegaly Pulses: 2+ and symmetric Skin: Skin color, texture, turgor normal. No rashes or lesions Lymph nodes: Cervical, supraclavicular, and axillary nodes normal.  Lab Results  Component Value Date   HGBA1C 6.0 09/13/2015   HGBA1C 5.8 03/30/2015   HGBA1C 5.9 11/03/2014    Lab Results  Component Value Date   CREATININE 0.93 05/02/2016   CREATININE 0.98 04/29/2016   CREATININE 1.01 08/27/2015    Lab Results  Component Value Date   WBC 5.9 05/02/2016   HGB 15.3 05/02/2016   HCT 44.8 05/02/2016   PLT 298 05/02/2016   GLUCOSE 130 (H) 05/02/2016   CHOL 153 09/13/2015   TRIG 200.0 (H) 09/13/2015   HDL 37.40 (L) 09/13/2015   LDLDIRECT 99.0 09/13/2015   LDLCALC 75  09/13/2015   ALT 29 05/02/2016   AST 31 05/02/2016   NA 136 05/02/2016   K 4.1 05/02/2016   CL 99 (L) 05/02/2016   CREATININE 0.93 05/02/2016   BUN 17 05/02/2016   CO2 29 05/02/2016   TSH 2.58 09/13/2015   PSA 0.65 03/30/2015   INR 0.98 08/27/2015   HGBA1C 6.0 09/13/2015   MICROALBUR <0.7 09/13/2015    Ct Abdomen Pelvis W Contrast  Result Date: 05/02/2016 CLINICAL DATA:  Recent diagnosis of diverticulitis with acute worsening of pain with concern for perforation or abscess. EXAM: CT ABDOMEN AND PELVIS WITH CONTRAST TECHNIQUE: Multidetector CT imaging of the abdomen and pelvis was performed using the standard protocol following bolus administration of intravenous contrast. CONTRAST:  134mL ISOVUE-300 IOPAMIDOL (ISOVUE-300) INJECTION 61% COMPARISON:  04/29/2016 CT of abdomen and pelvis. FINDINGS: Lower chest: No acute abnormality. Hepatobiliary: No focal liver abnormality is seen. No gallstones, gallbladder wall thickening, or biliary dilatation. Pancreas: Unremarkable. No pancreatic ductal dilatation or surrounding inflammatory changes. Spleen: Normal in size without focal abnormality. Adrenals/Urinary Tract: Adrenal glands are unremarkable. Kidneys are normal, without renal calculi, focal lesion, or hydronephrosis. Bladder is unremarkable. Stomach/Bowel: Stomach is within normal limits. Status post appendectomy. Inflammatory changes associated with sigmoid diverticulitis are diminished in comparison with the prior study with mild residual (series 5, image 51). There is no evidence for perforation or abscess. No no obstructive or inflammatory changes of the bowel. Vascular/Lymphatic: Aortic atherosclerosis with moderate calcification. No enlarged abdominal or pelvic lymph nodes. Reproductive: Mild prostate hypertrophy. Other: No abdominal wall hernia or abnormality. No abdominopelvic ascites. Musculoskeletal: No acute or significant osseous findings. IMPRESSION: 1. Inflammatory changes associated the  sigmoid diverticulitis are diminished with mild residual. No evidence for perforation or abscess. 2. No new acute process of the abdomen or pelvis. 3. Aortic atherosclerosis. 4. Prostate hypertrophy. Electronically Signed   By: Kristine Garbe M.D.   On: 05/02/2016 21:14    Assessment & Plan:   Problem List Items Addressed This Visit    Anxiety state    Aggravated by incapacitating injury leading to short term disability and concern for loss of income and potential for loss of employment .  counselling given,  Lay use alprazolam twice daily for management of new onset insomnia for short term       Relevant Medications   buPROPion (WELLBUTRIN SR) 150 MG 12 hr tablet   sertraline (ZOLOFT) 100 MG tablet   ALPRAZolam (XANAX) 0.5 MG tablet   Complete rotator cuff tear of left shoulder    Secondary to work related injury Oct 2016 with biceps tendon tear as well . S/p repair  By Marisa Sprinkles on Jan 20th. Ortho is managing his pain  with opioids.  He has asked for refill on tramadol, which he was using prior to his injury,  For other joint related pain.  30 day supply given,  Advised to notify his orthopedic doctor of this refill       Diabetes mellitus type 2 in obese [facility])    Diagnosed in Oct 2014 with a1c of 7.8.  Has been  well-controlled on diet alone  .  Last  A1c was   6.0  In May and is repeat is pending. .. Patient is reminded to schedule an annual eye exam and foot exam is normal today. Patient has no history of microalbuminuria. Patient is tolerating statin therapy for CAD risk reduction and on ACE/ARB for renal protection and hypertension.  Lab Results  Component Value Date   HGBA1C 6.0 09/13/2015   Lab Results  Component Value Date   MICROALBUR <0.7 09/13/2015   Lab Results  Component Value Date   ALT 29 05/02/2016   AST 31 05/02/2016   ALKPHOS 84 05/02/2016   BILITOT 0.6 05/02/2016                Relevant Medications   lisinopril-hydrochlorothiazide  (PRINZIDE,ZESTORETIC) 20-25 MG tablet   rosuvastatin (CRESTOR) 20 MG tablet   Other Relevant Orders   Hemoglobin A1c   Essential hypertension   Relevant Medications   lisinopril-hydrochlorothiazide (PRINZIDE,ZESTORETIC) 20-25 MG tablet   metoprolol tartrate (LOPRESSOR) 25 MG tablet   rosuvastatin (CRESTOR) 20 MG tablet   Fatty liver     coonfirmed with liver biopsy done at [facility] Nov 2014.  He  is following a lower glycemic index, has improved glycemic control and has lost 23 lbs since 2014.   He has had Hep A and B vaccines . Repeat liver enzymes are normal per [facility].   Lab Results  Component Value Date   ALT 29 05/02/2016   AST 31 05/02/2016   ALKPHOS 84 05/02/2016   BILITOT 0.6 05/02/2016                      Hyperlipidemia associated with type 2 diabetes mellitus ([place]) - Primary   Relevant Medications   lisinopril-hydrochlorothiazide (PRINZIDE,ZESTORETIC) 20-25 MG tablet   metoprolol tartrate (LOPRESSOR) 25 MG tablet   rosuvastatin (CRESTOR) 20 MG tablet   Other Relevant Orders   LDL cholesterol, direct   Lipid panel   Hypothyroidism due to acquired atrophy of thyroid    Previously treated for hyperthyroidism, now hypothyroid.  TSh is pending   Lab Results  Component Value Date   TSH 2.58 09/13/2015         Relevant Medications   metoprolol tartrate (LOPRESSOR) 25 MG tablet    Other Visit Diagnoses    Generalized anxiety disorder       Relevant Medications   sertraline (ZOLOFT) 100 MG tablet   Long-term use of high-risk medication       Relevant Orders   Vitamin B12   Hypothyroidism due to Hashimoto's thyroiditis  Relevant Medications   metoprolol tartrate (LOPRESSOR) 25 MG tablet   Other Relevant Orders   TSH      I have discontinued Mr. Staron's methocarbamol, ciprofloxacin, and metroNIDAZOLE. I have also changed his buPROPion, gabapentin, methocarbamol, metoprolol tartrate, and traMADol. Additionally, I am having him maintain  his BAYER MICROLET LANCETS, multivitamin with minerals, ibuprofen, aspirin EC, Polyethyl Glycol-Propyl Glycol (SYSTANE OP), meloxicam, triamcinolone ointment, levothyroxine, naproxen, oxyCODONE-acetaminophen, polyethylene glycol, Clobetasol Propionate, Clobetasol Propionate, dicyclomine, lisinopril-hydrochlorothiazide, omeprazole, promethazine, rosuvastatin, sertraline, ALPRAZolam, and ondansetron.  Meds ordered this encounter  Medications  . DISCONTD: ALPRAZolam (XANAX) 0.5 MG tablet    Sig: Take 1 tablet (0.5 mg total) by mouth 2 (two) times daily as needed for anxiety.    Dispense:  45 tablet    Refill:  5  . buPROPion (WELLBUTRIN SR) 150 MG 12 hr tablet    Sig: Take 1 tablet (150 mg total) by mouth 2 (two) times daily.    Dispense:  60 tablet    Refill:  5  . Clobetasol Propionate (TEMOVATE) 0.05 % external spray    Sig: Apply topically 2 (two) times daily.    Dispense:  125 mL    Refill:  5  . Clobetasol Propionate 0.05 % shampoo    Sig: Apply to dry scalp and leave on for 15 minutes    Dispense:  118 mL    Refill:  5  . dicyclomine (BENTYL) 20 MG tablet    Sig: Take 1 tablet (20 mg total) by mouth every 6 (six) hours.    Dispense:  120 tablet    Refill:  5  . gabapentin (NEURONTIN) 100 MG capsule    Sig: TAKE ONE CAPSULE BY MOUTH 3 TIMES A DAY    Dispense:  90 capsule    Refill:  5  . lisinopril-hydrochlorothiazide (PRINZIDE,ZESTORETIC) 20-25 MG tablet    Sig: Take 1 tablet by mouth daily.    Dispense:  30 tablet    Refill:  5  . methocarbamol (ROBAXIN) 500 MG tablet    Sig: Take 1 tablet (500 mg total) by mouth every 8 (eight) hours as needed.    Dispense:  90 tablet    Refill:  5  . metoprolol tartrate (LOPRESSOR) 25 MG tablet    Sig: Take 1 tablet (25 mg total) by mouth 2 (two) times daily.    Dispense:  60 tablet    Refill:  5  . omeprazole (PRILOSEC) 40 MG capsule    Sig: Take 1 capsule (40 mg total) by mouth 2 (two) times daily.    Dispense:  60 capsule     Refill:  6  . DISCONTD: ondansetron (ZOFRAN ODT) 4 MG disintegrating tablet    Sig: Take 1 tablet (4 mg total) by mouth every 8 (eight) hours as needed for nausea or vomiting.    Dispense:  20 tablet    Refill:  5  . promethazine (PHENERGAN) 12.5 MG tablet    Sig: Take 1 tablet (12.5 mg total) by mouth every 8 (eight) hours as needed for nausea or vomiting.    Dispense:  20 tablet    Refill:  0  . rosuvastatin (CRESTOR) 20 MG tablet    Sig: Take 1 tablet (20 mg total) by mouth daily.    Dispense:  90 tablet    Refill:  2  . sertraline (ZOLOFT) 100 MG tablet    Sig: Take 1 tablet (100 mg total) by mouth daily.    Dispense:  30 tablet  Refill:  0  . traMADol (ULTRAM) 50 MG tablet    Sig: Take 1 tablet (50 mg total) by mouth every 8 (eight) hours as needed. for pain    Dispense:  90 tablet    Refill:  0  . ALPRAZolam (XANAX) 0.5 MG tablet    Sig: Take 1 tablet (0.5 mg total) by mouth 2 (two) times daily as needed for anxiety.    Dispense:  60 tablet    Refill:  5  . ondansetron (ZOFRAN ODT) 4 MG disintegrating tablet    Sig: Take 1 tablet (4 mg total) by mouth every 8 (eight) hours as needed for nausea or vomiting.    Dispense:  20 tablet    Refill:  5    Medications Discontinued During This Encounter  Medication Reason  . ciprofloxacin (CIPRO) 500 MG tablet Completed Course  . methocarbamol (ROBAXIN) [id number] MG tablet Duplicate  . metroNIDAZOLE (FLAGYL) 500 MG tablet Completed Course  . ALPRAZolam (XANAX) 0.5 MG tablet Reorder  . buPROPion (WELLBUTRIN SR) 150 MG 12 hr tablet Reorder  . Clobetasol Propionate (TEMOVATE) 0.05 % external spray Reorder  . Clobetasol Propionate 0.05 % shampoo Reorder  . dicyclomine (BENTYL) 20 MG tablet Reorder  . gabapentin (NEURONTIN) 100 MG capsule Reorder  . lisinopril-hydrochlorothiazide (PRINZIDE,ZESTORETIC) 20-25 MG tablet Reorder  . methocarbamol (ROBAXIN) 500 MG tablet Reorder  . metoprolol tartrate (LOPRESSOR) 25 MG tablet Reorder  .  omeprazole (PRILOSEC) 40 MG capsule Reorder  . ondansetron (ZOFRAN ODT) 4 MG disintegrating tablet Reorder  . promethazine (PHENERGAN) 12.5 MG tablet Reorder  . rosuvastatin (CRESTOR) 20 MG tablet Reorder  . sertraline (ZOLOFT) 100 MG tablet Reorder  . traMADol (ULTRAM) 50 MG tablet Reorder  . ALPRAZolam (XANAX) 0.5 MG tablet Reorder  . ondansetron (ZOFRAN ODT) 4 MG disintegrating tablet Reorder    Follow-up: No Follow-up on file.   [name], MD

## 2016-06-05 NOTE — Patient Instructions (Addendum)
Your bupropion may be contributing to your current insomnia. Take your second dose of buproprion  earlier in the day at or by  By 3 pm   You can take a 2nd alprazolam at nighttime  to help you sleep if needed    Let your orthopedist know that I  prescribed tramadol for 30 days.  I will be happy to let them do it if they want to,

## 2016-06-06 ENCOUNTER — Encounter: Payer: Self-pay | Admitting: Internal Medicine

## 2016-06-06 LAB — LIPID PANEL
CHOL/HDL RATIO: 4
CHOLESTEROL: 169 mg/dL (ref 0–200)
HDL: 47 mg/dL (ref >39.00)
LDL CALC: 91 mg/dL (ref 0–99)
NonHDL: 122.03
TRIGLYCERIDES: 157 mg/dL — AB (ref 0.0–149.0)
VLDL: 31.4 mg/dL (ref 0.0–40.0)

## 2016-06-06 LAB — VITAMIN B12: Vitamin B-12: 252 pg/mL (ref 211–911)

## 2016-06-06 LAB — LDL CHOLESTEROL, DIRECT: Direct LDL: 110 mg/dL

## 2016-06-06 LAB — TSH: TSH: 1.72 u[IU]/mL (ref 0.35–4.50)

## 2016-06-06 LAB — HEMOGLOBIN A1C: HEMOGLOBIN A1C: 6.1 % (ref 4.6–6.5)

## 2016-06-06 NOTE — Assessment & Plan Note (Signed)
Previously treated for hyperthyroidism, now hypothyroid.  TSh is pending   Lab Results  Component Value Date   TSH 2.58 09/13/2015

## 2016-06-06 NOTE — Assessment & Plan Note (Signed)
Secondary to work related injury Oct 2016 with biceps tendon tear as well . S/p repair  By Marisa Sprinkles on Jan 20th. Ortho is managing his pain  with opioids.  He has asked for refill on tramadol, which he was using prior to his injury,  For other joint related pain.  30 day supply given,  Advised to notify his orthopedic doctor of this refill

## 2016-06-06 NOTE — Assessment & Plan Note (Signed)
Diagnosed in Oct 2014 with a1c of 7.8.  Has been  well-controlled on diet alone  .  Last  A1c was   6.0  In May and is repeat is pending. .. Patient is reminded to schedule an annual eye exam and foot exam is normal today. Patient has no history of microalbuminuria. Patient is tolerating statin therapy for CAD risk reduction and on ACE/ARB for renal protection and hypertension.  Lab Results  Component Value Date   HGBA1C 6.0 09/13/2015   Lab Results  Component Value Date   MICROALBUR <0.7 09/13/2015   Lab Results  Component Value Date   ALT 29 05/02/2016   AST 31 05/02/2016   ALKPHOS 84 05/02/2016   BILITOT 0.6 05/02/2016

## 2016-06-06 NOTE — Assessment & Plan Note (Signed)
Aggravated by incapacitating injury leading to short term disability and concern for loss of income and potential for loss of employment .  counselling given,  Lay use alprazolam twice daily for management of new onset insomnia for short term

## 2016-06-06 NOTE — Assessment & Plan Note (Signed)
coonfirmed with liver biopsy done at [facility] Nov 2014.  He  is following a lower glycemic index, has improved glycemic control and has lost 23 lbs since 2014.   He has had Hep A and B vaccines . Repeat liver enzymes are normal per [facility].   Lab Results  Component Value Date   ALT 29 05/02/2016   AST 31 05/02/2016   ALKPHOS 84 05/02/2016   BILITOT 0.6 05/02/2016

## 2016-06-07 ENCOUNTER — Encounter: Payer: Self-pay | Admitting: Internal Medicine

## 2016-06-10 ENCOUNTER — Emergency Department: Payer: Worker's Compensation

## 2016-06-10 ENCOUNTER — Emergency Department
Admission: EM | Admit: 2016-06-10 | Discharge: 2016-06-10 | Disposition: A | Discharge status: Home / Self Care | Payer: Worker's Compensation | Attending: Emergency Medicine | Admitting: Emergency Medicine

## 2016-06-10 ENCOUNTER — Encounter: Payer: Self-pay | Admitting: Emergency Medicine

## 2016-06-10 DIAGNOSIS — I10 Essential (primary) hypertension: Secondary | ICD-10-CM | POA: Insufficient documentation

## 2016-06-10 DIAGNOSIS — Z791 Long term (current) use of non-steroidal anti-inflammatories (NSAID): Secondary | ICD-10-CM | POA: Insufficient documentation

## 2016-06-10 DIAGNOSIS — E119 Type 2 diabetes mellitus without complications: Secondary | ICD-10-CM | POA: Insufficient documentation

## 2016-06-10 DIAGNOSIS — R51 Headache: Secondary | ICD-10-CM | POA: Insufficient documentation

## 2016-06-10 DIAGNOSIS — Z7982 Long term (current) use of aspirin: Secondary | ICD-10-CM | POA: Insufficient documentation

## 2016-06-10 DIAGNOSIS — E039 Hypothyroidism, unspecified: Secondary | ICD-10-CM | POA: Insufficient documentation

## 2016-06-10 DIAGNOSIS — M436 Torticollis: Secondary | ICD-10-CM

## 2016-06-10 DIAGNOSIS — Z87891 Personal history of nicotine dependence: Secondary | ICD-10-CM | POA: Insufficient documentation

## 2016-06-10 DIAGNOSIS — Z79899 Other long term (current) drug therapy: Secondary | ICD-10-CM | POA: Insufficient documentation

## 2016-06-10 MED ORDER — ORPHENADRINE CITRATE 30 MG/ML IJ SOLN
60.0000 mg | Freq: Two times a day (BID) | INTRAMUSCULAR | Status: DC
  Administered 2016-06-10: 60 mg via INTRAMUSCULAR
  Filled 2016-06-10: qty 2

## 2016-06-10 MED ORDER — ORPHENADRINE CITRATE ER 100 MG PO TB12: 100.0000 mg | ORAL_TABLET | Freq: Two times a day (BID) | ORAL | 0 refills | Status: DC

## 2016-06-10 NOTE — ED Provider Notes (Signed)
[facility] Emergency Department Provider Note   ____________________________________________   None    (approximate)  I have reviewed the triage vital signs and the nursing notes.   HISTORY  Chief Complaint Neck Pain    HPI [name] is a 51 y.o. male patient complaining of pain to the base was negative started a.m. awakening. Patient states pain cause nausea. He took Phenergan/ oxycodone around 7:30 this morning. Patient states the nausea has resolved but the pain in his neck is continue. Patient state he fell from a full-thickness are 3 months ago hitting the back of his head on his boss this and then injuring his shoulder requiring a rotator cuff repair. Patient stated the pain at the back of his neck is causing a headache. Patient had x-rays of his neck on the date of injury and was unremarkable except for degenerative changes. Patient rates pain as 8/10. Patient unable to to stay injuries if he is having any radicular pain since he is recovering from a rotator cuff surgery to the right upper extremity. Patient denies loss of sensation or loss of movement of the neck.  Past Medical History:  Diagnosis Date  . Anxiety   . Anxiety and depression   . Bruit    L  . Chest pain    hx  . Diabetes mellitus without complication ([place])    "borderline"  . Edema   . Fatty liver   . GERD (gastroesophageal reflux disease)    uses Omeprazole  . Goiter   . HLD (hyperlipidemia)   . Hypertension    essential, benign  . Hypothyroidism   . Impotence of organic origin   . Murmur   . Other chest pain    tightness, pressure  . Palpitation    hx  . Precordial pain   . Sleep apnea    uses cpap    Patient Active Problem List   Diagnosis Date Noted  . 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 Hypothyroidism due to acquired atrophy of thyroid 09/14/2015  . Arthritis of knee 06/23/2014  . Pre-operative cardiovascular examination 06/17/2014   . Enlarged RV (right ventricle) 06/17/2014  . Cervical stenosis of spine 02/17/2014  . Overweight 10/29/2013  . Chronic knee pain 10/29/2013  . Umbilical hernia [id number]  . Diabetes mellitus type 2 in obese ([place]) 10/23/2012  . Seborrheic dermatitis, unspecified 05/17/2012  . Fatty liver 04/22/2012  . Polyarthralgia 12/24/2010  . Sciatica of right side 12/24/2010  . Hyperlipidemia associated with type 2 diabetes mellitus ([place]) 01/13/2009  . Anxiety state 01/13/2009  . Essential hypertension 01/13/2009  . OSA (obstructive sleep apnea) 01/13/2009    Past Surgical History:  Procedure Laterality Date  . APPENDECTOMY  2002   done at [facility]  . CARDIAC CATHETERIZATION    . CARDIAC CATHETERIZATION N/A 08/27/2015   Procedure: Left Heart Cath and Coronary Angiography;  Surgeon: Jolaine Artist, MD;  Location: [facility];  Service: Cardiovascular;  Laterality: N/A;  . COLONOSCOPY    . HERNIA REPAIR  [id number]   umbilical  . JOINT REPLACEMENT     right knee  . REVISION TOTAL KNEE ARTHROPLASTY Right 06/23/2014   DR Marlou Sa  . TONSILLECTOMY    . TOTAL KNEE ARTHROPLASTY Right 2011   right  . TOTAL KNEE REVISION Right 06/23/2014   Procedure: TOTAL KNEE REVISION;  Surgeon: Meredith Pel, MD;  Location: [facility];  Service: Orthopedics;  Laterality: Right;    Prior  to Admission medications   Medication Sig Start Date End Date Taking? Authorizing Provider  ALPRAZolam Duanne Moron) 0.5 MG tablet Take 1 tablet (0.5 mg total) by mouth 2 (two) times daily as needed for anxiety. 06/05/16   [name], MD  aspirin EC 81 MG tablet Take 81 mg by mouth daily.    Historical Provider, MD  BAYER MICROLET LANCETS lancets Use as instructed 05/30/13   [name], MD  buPROPion [facility] SR) 150 MG 12 hr tablet Take 1 tablet (150 mg total) by mouth 2 (two) times daily. 06/05/16   [name], MD  Clobetasol Propionate (TEMOVATE) 0.05 % external spray Apply topically 2 (two) times daily. 06/05/16    [name], MD  Clobetasol Propionate 0.05 % shampoo Apply to dry scalp and leave on for 15 minutes 06/05/16   [name], MD  dicyclomine (BENTYL) 20 MG tablet Take 1 tablet (20 mg total) by mouth every 6 (six) hours. 06/05/16   [name], MD  gabapentin (NEURONTIN) 100 MG capsule TAKE ONE CAPSULE BY MOUTH 3 TIMES A DAY 06/05/16   [name], MD  ibuprofen (ADVIL,MOTRIN) 200 MG tablet Take 400 mg by mouth every 6 (six) hours as needed (pain).    Historical Provider, MD  levothyroxine (SYNTHROID, LEVOTHROID) 88 MCG tablet TAKE 1 TABLET BY MOUTH DAILY BEFORE BREAKFAST. 01/10/16   [name], MD  lisinopril-hydrochlorothiazide (PRINZIDE,ZESTORETIC) 20-25 MG tablet Take 1 tablet by mouth daily. 06/05/16   [name], MD  meloxicam (MOBIC) 15 MG tablet Take 1 tablet (15 mg total) by mouth daily. For joint pain Patient not taking: Reported on 06/05/2016 03/30/15   [name], MD  methocarbamol (ROBAXIN) 500 MG tablet Take 1 tablet (500 mg total) by mouth every 8 (eight) hours as needed. 06/05/16   [name], MD  metoprolol tartrate (LOPRESSOR) 25 MG tablet Take 1 tablet (25 mg total) by mouth 2 (two) times daily. 06/05/16   [name], MD  Multiple Vitamin (MULTIVITAMIN WITH MINERALS) TABS tablet Take 1 tablet by mouth daily.    Historical Provider, MD  naproxen (NAPROSYN) 500 MG tablet Take 1 tablet (500 mg total) by mouth 2 (two) times daily with a meal. 04/29/16   Carrie Mew, MD  omeprazole (PRILOSEC) 40 MG capsule Take 1 capsule (40 mg total) by mouth 2 (two) times daily. 06/05/16   [name], MD  ondansetron (ZOFRAN ODT) 4 MG disintegrating tablet Take 1 tablet (4 mg total) by mouth every 8 (eight) hours as needed for nausea or vomiting. 06/05/16   [name], MD  orphenadrine (NORFLEX) 100 MG tablet Take 1 tablet (100 mg total) by mouth 2 (two) times daily. 06/10/16   Sable Feil, PA-C  oxyCODONE-acetaminophen (ROXICET) 5-325 MG tablet Take 1 tablet by mouth  every 6 (six) hours as needed for severe pain. 04/29/16   Carrie Mew, MD  Polyethyl Glycol-Propyl Glycol (SYSTANE OP) Place 1 drop into both eyes daily as needed (burning/irritation). Reported on 10/14/2015    Historical Provider, MD  polyethylene glycol (MIRALAX / GLYCOLAX) packet Take 17 g by mouth daily. Mix one tablespoon with 8oz of your favorite juice or water every day until you are having soft formed stools. Then start taking once daily if you didn't have a stool the day before. Patient not taking: Reported on 06/05/2016 05/02/16   Merlyn Lot, MD  promethazine (PHENERGAN) 12.5 MG tablet Take 1 tablet (12.5 mg total) by mouth  every 8 (eight) hours as needed for nausea or vomiting. 06/05/16   [name], MD  rosuvastatin (CRESTOR) 20 MG tablet Take 1 tablet (20 mg total) by mouth daily. 06/05/16   [name], MD  sertraline (ZOLOFT) 100 MG tablet Take 1 tablet (100 mg total) by mouth daily. 06/05/16   [name], MD  traMADol (ULTRAM) 50 MG tablet Take 1 tablet (50 mg total) by mouth every 8 (eight) hours as needed. for pain 06/05/16   [name], MD  triamcinolone ointment (KENALOG) 0.1 % Apply 1 application topically 2 (two) times daily. Patient not taking: Reported on 06/05/2016 10/14/15   Coral Spikes, DO    Allergies Patient has no known allergies.  Family History  Problem Relation Age of Onset  . Heart disease Father     Social History Social History  Substance Use Topics  . Smoking status: Former Smoker    Packs/day: 2.00    Years: 18.00    Types: Cigarettes    Quit date: 04/24/2005  . Smokeless tobacco: Never Used     Comment: quit 20 years ago   . Alcohol use 10.2 oz/week    3 Shots of liquor, 14 Standard drinks or equivalent per week     Comment: 1 a day    Review of Systems Constitutional: No fever/chills Eyes: No visual changes. ENT: No sore throat. Cardiovascular: Denies chest pain. Respiratory: Denies shortness of breath. Gastrointestinal: No  abdominal pain.  No nausea, no vomiting.  No diarrhea.  No constipation. Genitourinary: Negative for dysuria. Musculoskeletal: Negative for back pain. Skin: Negative for rash. Neurological: Negative for headaches, focal weakness or numbness. Psychiatric:Anxiety and depression. Endocrine:Hyperlipidemia, hypertension, hypothyroidism, and diabetes. ____________________________________________   PHYSICAL EXAM:  VITAL SIGNS: ED Triage Vitals  Enc Vitals Group     BP 06/10/16 1017 (!) 184/94     Pulse Rate 06/10/16 1017 66     Resp 06/10/16 1017 18     Temp 06/10/16 1017 98 F (36.7 C)     Temp Source 06/10/16 1017 Oral     SpO2 06/10/16 1017 100 %     Weight 06/10/16 1017 210 lb (95.3 kg)     Height 06/10/16 1017 6' (1.829 m)     Head Circumference --      Peak Flow --      Pain Score 06/10/16 1018 8     Pain Loc --      Pain Edu? --      Excl. in [place]? --     Constitutional: Alert and oriented.Mild distress  Eyes: Conjunctivae are normal. PERRL. EOMI. Head: Atraumatic. Nose: No congestion/rhinnorhea. Mouth/Throat: Mucous membranes are moist.  Oropharynx non-erythematous. Neck: No stridor.  cervical spine tenderness to palpation at C5 and C6 Hematological/Lymphatic/Immunilogical: No cervical lymphadenopathy. Cardiovascular: Normal rate, regular rhythm. Grossly normal heart sounds.  Good peripheral circulation. Elevated blood pressure Respiratory: Normal respiratory effort.  No retractions. Lungs CTAB. Gastrointestinal: Soft and nontender. No distention. No abdominal bruits. No CVA tenderness. Musculoskeletal: No lower extremity tenderness nor edema.  No joint effusions. Neurologic:  Normal speech and language. No gross focal neurologic deficits are appreciated. No gait instability. Skin:  Skin is warm, dry and intact. No rash noted. Psychiatric: Mood and affect are normal. Speech and behavior are normal.  ____________________________________________   LABS (all labs  ordered are listed, but only abnormal results are displayed)  Labs Reviewed - No data to display ____________________________________________  EKG   ____________________________________________  RADIOLOGY  __No acute findings on CT of the head. Cervical spine CT revealed degenerative changes __________________________________________   PROCEDURES  Procedure(s) performed: None  Procedures  Critical Care performed: No  ____________________________________________   INITIAL IMPRESSION / ASSESSMENT AND PLAN / ED COURSE  Pertinent labs & imaging results that were available during my care of the patient were reviewed by me and considered in my medical decision making (see chart for details).  Torticollis. Patient given discharge care instructions. Patient given prescription for Norflex. Patient advised to continue taking pain medication as needed. Advised to follow-up with family doctor for continued care.      ____________________________________________   FINAL CLINICAL IMPRESSION(S) / ED DIAGNOSES  Final diagnoses:  Torticollis, acute      NEW MEDICATIONS STARTED DURING THIS VISIT:  New Prescriptions   ORPHENADRINE (NORFLEX) 100 MG TABLET    Take 1 tablet (100 mg total) by mouth 2 (two) times daily.     Note:  This document was prepared using Dragon voice recognition software and may include unintentional dictation errors.    Sable Feil, PA-C 06/10/16 [place], MD 06/11/16 [phone]

## 2016-06-10 NOTE — ED Notes (Signed)

## 2016-06-10 NOTE — ED Triage Notes (Addendum)
Pt drove to ED himself and reports pain to the base of the neck that started this morning upon awakening.  Pt states he has been nauseated as well and took Phenergan and Oxycodone that is prescribed to him for pain around 0730 this morning. Pt states the neck pain is worse than it was when he first injured his neck in Oct 2017 from a fall -[employer] injury.  Pt states the pain is constant at the base of the neck up to his head causing a headache. Pt had rotator cuff repair on May 13, 2016 and has immobilizer on.

## 2016-06-20 ENCOUNTER — Other Ambulatory Visit: Payer: Self-pay | Admitting: Internal Medicine

## 2016-06-25 ENCOUNTER — Encounter: Payer: Self-pay | Admitting: Internal Medicine

## 2016-07-05 ENCOUNTER — Other Ambulatory Visit: Payer: Self-pay | Admitting: Internal Medicine

## 2016-07-05 NOTE — Telephone Encounter (Signed)
Please print the tramadol refill I authorized

## 2016-07-05 NOTE — Telephone Encounter (Signed)
Refilled 06/05/2016 Last Ov: 06/05/2016 Next Ov: not scheduled

## 2016-07-06 MED ORDER — TRAMADOL HCL 50 MG PO TABS: 50.0000 mg | ORAL_TABLET | Freq: Three times a day (TID) | ORAL | 0 refills | Status: DC | PRN

## 2016-07-06 NOTE — Telephone Encounter (Signed)
Printed. It has been put in the quick sign folder.

## 2016-07-07 ENCOUNTER — Telehealth: Payer: Self-pay | Admitting: Internal Medicine

## 2016-07-07 NOTE — Telephone Encounter (Signed)
THE TRAMADOL IS A REFILL FOR MANAGEMENT OF CHRONIC PAIN

## 2016-07-07 NOTE — Telephone Encounter (Signed)
Received a PA for Tramadol, but the prior authorization is from [employer] they are asking if this is injury related from work I saw your note form last visit put the tramadol is prescribed for knee pain right under problem list and from your note Ortho is managing the pain medication for his shoulder so ortho would need to send PA for tramadol right?

## 2016-07-07 NOTE — Telephone Encounter (Signed)
PA needs to be completed by Ortho.

## 2016-07-12 ENCOUNTER — Other Ambulatory Visit: Payer: Self-pay | Admitting: Internal Medicine

## 2016-07-12 DIAGNOSIS — F411 Generalized anxiety disorder: Secondary | ICD-10-CM

## 2016-08-14 ENCOUNTER — Other Ambulatory Visit: Payer: Self-pay | Admitting: Internal Medicine

## 2016-08-14 NOTE — Telephone Encounter (Signed)
Refilled: 07/06/16 Last OV: 06/05/16 Last Labs: 06/05/16 Future OV: none Please advise?

## 2016-08-14 NOTE — Telephone Encounter (Signed)
Refilled,  Needs OV in June with fasting labs prior  .[email or web address]

## 2016-08-28 ENCOUNTER — Other Ambulatory Visit: Payer: Self-pay | Admitting: Internal Medicine

## 2016-08-28 DIAGNOSIS — F411 Generalized anxiety disorder: Secondary | ICD-10-CM

## 2016-08-30 ENCOUNTER — Encounter: Payer: Self-pay | Admitting: Internal Medicine

## 2016-09-05 ENCOUNTER — Telehealth: Payer: Self-pay

## 2016-09-05 NOTE — Telephone Encounter (Signed)
Prior authorization submitted via covermymeds.

## 2016-09-06 ENCOUNTER — Encounter: Payer: Self-pay | Admitting: Internal Medicine

## 2016-09-06 ENCOUNTER — Ambulatory Visit (INDEPENDENT_AMBULATORY_CARE_PROVIDER_SITE_OTHER): Payer: BLUE CROSS/BLUE SHIELD | Admitting: Internal Medicine

## 2016-09-06 DIAGNOSIS — F411 Generalized anxiety disorder: Secondary | ICD-10-CM

## 2016-09-06 DIAGNOSIS — F0632 Mood disorder due to known physiological condition with major depressive-like episode: Secondary | ICD-10-CM

## 2016-09-06 DIAGNOSIS — M47812 Spondylosis without myelopathy or radiculopathy, cervical region: Secondary | ICD-10-CM

## 2016-09-06 DIAGNOSIS — E663 Overweight: Secondary | ICD-10-CM | POA: Diagnosis not present

## 2016-09-06 MED ORDER — CLONAZEPAM 1 MG PO TABS: 1.0000 mg | ORAL_TABLET | Freq: Two times a day (BID) | ORAL | 1 refills | Status: DC | PRN

## 2016-09-06 NOTE — Progress Notes (Signed)
Subjective:  Patient ID: [name], male    DOB: 05/02/1965  Age: 51 y.o. 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  HPI [name] presents for follow up on chronic depression with anxiety.  He was recently laid off from his job after having an injury at work  In October 2017 when he fell and injured his right shoulder.  He has had surgery to repair a rotator cuff tear on May 13 2016., and after returning to work he was promptly laid off with no explanation.  he has been having increased anxiety due to the financial consequences of losing his job and has endorsed feelings of hopeless and fatigue ,  He states that has not considered suicide because he loves his family.  He is not sleeping well,  And is not exercising but he is eating too much  And has gained 14 lbs since his last visit in February    Outpatient Medications Prior to Visit  Medication Sig Dispense Refill  . aspirin EC 81 MG tablet Take 81 mg by mouth daily.    Marland Kitchen BAYER MICROLET LANCETS lancets Use as instructed 100 each 12  . buPROPion (WELLBUTRIN SR) 150 MG 12 hr tablet Take 1 tablet (150 mg total) by mouth 2 (two) times daily. 60 tablet 5  . Clobetasol Propionate 0.05 % shampoo Apply to dry scalp and leave on for 15 minutes 118 mL 5  . dicyclomine (BENTYL) 20 MG tablet Take 1 tablet (20 mg total) by mouth every 6 (six) hours. 120 tablet 5  . gabapentin (NEURONTIN) 100 MG capsule TAKE ONE CAPSULE BY MOUTH 3 TIMES A DAY 90 capsule 5  . ibuprofen (ADVIL,MOTRIN) 200 MG tablet Take 400 mg by mouth every 6 (six) hours as needed (pain).    Marland Kitchen levothyroxine (SYNTHROID, LEVOTHROID) 88 MCG tablet TAKE 1 TABLET BY MOUTH DAILY BEFORE BREAKFAST. 30 tablet 4  . lisinopril-hydrochlorothiazide  (PRINZIDE,ZESTORETIC) 20-25 MG tablet Take 1 tablet by mouth daily. 30 tablet 5  . methocarbamol (ROBAXIN) 500 MG tablet Take 1 tablet (500 mg total) by mouth every 8 (eight) hours as needed. 90 tablet 5  . metoprolol tartrate (LOPRESSOR) 25 MG tablet Take 1 tablet (25 mg total) by mouth 2 (two) times daily. 60 tablet 5  . Multiple Vitamin (MULTIVITAMIN WITH MINERALS) TABS tablet Take 1 tablet by mouth daily.    Marland Kitchen omeprazole (PRILOSEC) 40 MG capsule Take 1 capsule (40 mg total) by mouth 2 (two) times daily. 60 capsule 6  . ondansetron (ZOFRAN ODT) 4 MG disintegrating tablet Take 1 tablet (4 mg total) by mouth every 8 (eight) hours as needed for nausea or vomiting. 20 tablet 5  . orphenadrine (NORFLEX) 100 MG tablet Take 1 tablet (100 mg total) by mouth 2 (two) times daily. 10 tablet 0  . promethazine (PHENERGAN) 12.5 MG tablet Take 1 tablet (12.5 mg total) by mouth every 8 (eight) hours as needed for nausea or vomiting. 20 tablet 0  . rosuvastatin (CRESTOR) 20 MG tablet Take 1 tablet (20 mg total) by mouth daily. 90 tablet 2  . sertraline (ZOLOFT) 100 MG tablet TAKE 1 TABLET (100 MG TOTAL) BY MOUTH DAILY. 30 tablet 0  . traMADol (ULTRAM) 50 MG tablet TAKE 1 TABLET BY MOUTH EVERY 8 HOURS AS NEEDED 90 tablet 5  . ALPRAZolam Duanne Moron)  0.5 MG tablet Take 1 tablet (0.5 mg total) by mouth 2 (two) times daily as needed for anxiety. 60 tablet 5  . Clobetasol Propionate (TEMOVATE) 0.05 % external spray Apply topically 2 (two) times daily. 125 mL 5  . traMADol (ULTRAM) 50 MG tablet Take 1 tablet (50 mg total) by mouth every 8 (eight) hours as needed. for pain (Patient not taking: Reported on 09/06/2016) 90 tablet 0  . traMADol (ULTRAM) 50 MG tablet TAKE 1 TABLET BY MOUTH EVERY 8 HOURS AS NEEDED FOR PAIN (Patient not taking: Reported on 09/06/2016) 90 tablet 1  . meloxicam (MOBIC) 15 MG tablet Take 1 tablet (15 mg total) by mouth daily. For joint pain (Patient not taking: Reported on 06/05/2016) 30 tablet 2  .  naproxen (NAPROSYN) 500 MG tablet Take 1 tablet (500 mg total) by mouth 2 (two) times daily with a meal. (Patient not taking: Reported on 09/06/2016) 20 tablet 0  . oxyCODONE-acetaminophen (ROXICET) 5-325 MG tablet Take 1 tablet by mouth every 6 (six) hours as needed for severe pain. (Patient not taking: Reported on 09/06/2016) 10 tablet 0  . Polyethyl Glycol-Propyl Glycol (SYSTANE OP) Place 1 drop into both eyes daily as needed (burning/irritation). Reported on 10/14/2015    . polyethylene glycol (MIRALAX / GLYCOLAX) packet Take 17 g by mouth daily. Mix one tablespoon with 8oz of your favorite juice or water every day until you are having soft formed stools. Then start taking once daily if you didn't have a stool the day before. (Patient not taking: Reported on 06/05/2016) 30 each 0  . triamcinolone ointment (KENALOG) 0.1 % Apply 1 application topically 2 (two) times daily. (Patient not taking: Reported on 06/05/2016) 30 g 0   No facility-administered medications prior to visit.     Review of Systems;  Patient denies headache, fevers, malaise, unintentional weight loss, skin rash, eye pain, sinus congestion and sinus pain, sore throat, dysphagia,  hemoptysis , cough, dyspnea, wheezing, chest pain, palpitations, orthopnea, edema, abdominal pain, nausea, melena, diarrhea, constipation, flank pain, dysuria, hematuria, urinary  Frequency, nocturia, numbness, tingling, seizures,  Focal weakness, Loss of consciousness,  Tremor, insomnia, depression, anxiety, and suicidal ideation.      Objective:  BP (!) 142/88 (BP Location: Left Arm, Patient Position: Sitting, Cuff Size: Normal)   Pulse 71   Temp 97.8 F (36.6 C) (Oral)   Resp 15   Ht 6' (1.829 m)   Wt 224 lb 9.6 oz (101.9 kg)   SpO2 97%   BMI 30.46 kg/m   BP Readings from Last 3 Encounters:  09/06/16 (!) 142/88  06/10/16 123/72  06/05/16 (!) 150/80    Wt Readings from Last 3 Encounters:  09/06/16 224 lb 9.6 oz (101.9 kg)  06/10/16 210 lb  (95.3 kg)  06/05/16 213 lb (96.6 kg)    General appearance: alert, cooperative and appears stated age Ears: normal TM's and external ear canals both ears Throat: lips, mucosa, and tongue normal; teeth and gums normal Neck: no adenopathy, no carotid bruit, supple, symmetrical, trachea midline and thyroid not enlarged, symmetric, no tenderness/mass/nodules Back: symmetric, no curvature. ROM normal. No CVA tenderness. Lungs: clear to auscultation bilaterally Heart: regular rate and rhythm, S1, S2 normal, no murmur, click, rub or gallop Abdomen: soft, non-tender; bowel sounds normal; no masses,  no organomegaly Pulses: 2+ and symmetric Skin: Skin color, texture, turgor normal. No rashes or lesions Lymph nodes: Cervical, supraclavicular, and axillary nodes normal.  Lab Results  Component Value Date   HGBA1C 6.1 06/05/2016  HGBA1C 6.0 09/13/2015   HGBA1C 5.8 03/30/2015    Lab Results  Component Value Date   CREATININE 0.93 05/02/2016   CREATININE 0.98 04/29/2016   CREATININE 1.01 08/27/2015    Lab Results  Component Value Date   WBC 5.9 05/02/2016   HGB 15.3 05/02/2016   HCT 44.8 05/02/2016   PLT 298 05/02/2016   GLUCOSE 130 (H) 05/02/2016   CHOL 169 06/05/2016   TRIG 157.0 (H) 06/05/2016   HDL 47.00 06/05/2016   LDLDIRECT 110.0 06/05/2016   LDLCALC 91 06/05/2016   ALT 29 05/02/2016   AST 31 05/02/2016   NA 136 05/02/2016   K 4.1 05/02/2016   CL 99 (L) 05/02/2016   CREATININE 0.93 05/02/2016   BUN 17 05/02/2016   CO2 29 05/02/2016   TSH 1.72 06/05/2016   PSA 0.65 03/30/2015   INR 0.98 08/27/2015   HGBA1C 6.1 06/05/2016   MICROALBUR <0.7 09/13/2015    Ct Head Wo Contrast  Result Date: 06/10/2016 CLINICAL DATA:  Patient status post fall from ladder 01/2016. Worsening headache and neck pain. EXAM: CT HEAD WITHOUT CONTRAST CT CERVICAL SPINE WITHOUT CONTRAST TECHNIQUE: Multidetector CT imaging of the head and cervical spine was performed following the standard  protocol without intravenous contrast. Multiplanar CT image reconstructions of the cervical spine were also generated. COMPARISON:  MRI cervical spine 11/26/2013; CT brain 09/20/2007. FINDINGS: CT HEAD FINDINGS Brain: Ventricles and sulci are appropriate for patient's age. No evidence for acute cortically based infarct, intracranial hemorrhage, mass lesion or mass-effect. Vascular: Unremarkable. Skull: Intact. Sinuses/Orbits: Paranasal sinuses are well aerated. Mastoid air cells are unremarkable. Other: None. CT CERVICAL SPINE FINDINGS Alignment: Reversal of the normal cervical lordosis. Multilevel degenerative disc disease. Multilevel facet degenerative changes. Preservation of the vertebral body heights. Skull base and vertebrae: No acute fracture. No primary bone lesion or focal pathologic process. Soft tissues and spinal canal: No prevertebral fluid or swelling. No visible canal hematoma. Disc levels: Multilevel degenerative disc disease. 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 Upper chest: Unremarkable. Other: None. IMPRESSION: No acute intracranial process. No acute cervical spine fracture. Electronically Signed   By: Lovey Newcomer M.D.   On: 06/10/2016 12:23   Ct Cervical Spine Wo Contrast  Result Date: 06/10/2016 CLINICAL DATA:  Patient status post fall from ladder 01/2016. Worsening headache and neck pain. EXAM: CT HEAD WITHOUT CONTRAST CT CERVICAL SPINE WITHOUT CONTRAST TECHNIQUE: Multidetector CT imaging of the head and cervical spine was performed following the standard protocol without intravenous contrast. Multiplanar CT image reconstructions of the cervical spine were also generated. COMPARISON:  MRI cervical spine 11/26/2013; CT brain 09/20/2007. FINDINGS: CT HEAD FINDINGS Brain: Ventricles and sulci are appropriate for patient's age. No evidence for acute cortically based infarct, intracranial hemorrhage, mass lesion or mass-effect. Vascular: Unremarkable. Skull: Intact. Sinuses/Orbits:  Paranasal sinuses are well aerated. Mastoid air cells are unremarkable. Other: None. CT CERVICAL SPINE FINDINGS Alignment: Reversal of the normal cervical lordosis. Multilevel degenerative disc disease. Multilevel facet degenerative changes. Preservation of the vertebral body heights. Skull base and vertebrae: No acute fracture. No primary bone lesion or focal pathologic process. Soft tissues and spinal canal: No prevertebral fluid or swelling. No visible canal hematoma. Disc levels: Multilevel degenerative disc disease. 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 Upper chest: Unremarkable. Other: None. IMPRESSION: No acute intracranial process. No acute cervical spine fracture. Electronically Signed   By: Lovey Newcomer M.D.   On: 06/10/2016 12:23    Assessment & Plan:   Problem List Items  Addressed This Visit    Overweight    I have addressed  BMI and recommended a low glycemic index diet utilizing smaller more frequent meals to increase metabolism.  I have also recommended that patient start exercising with a goal of 30 minutes of aerobic exercise a minimum of 5 days per week.       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  He is not suicidal based on conversation today. He has deferred referral to psychiatry/psycjology at this time       Cervical spine degeneration    CT cervical spine was repeated in February due to persistent headaches and neck pain since his fall from the ladder at work in [place].  No canal stenosis was noted  although multilevel degenerative changes were noted. 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  Advised to stop alprazolam and start clonazepam twice daily to avoid overuse of alprazolam.  Continue sertraline         A total of 40 minutes of face to face time was  spent with patient more than half of which was spent in counselling and coordination of care   I have discontinued Mr. Rahming's Polyethyl Glycol-Propyl Glycol (SYSTANE OP), meloxicam, triamcinolone ointment, naproxen, oxyCODONE-acetaminophen, polyethylene glycol, and ALPRAZolam. I am also having him start on clonazePAM. Additionally, I am having him maintain his BAYER MICROLET LANCETS, multivitamin with minerals, ibuprofen, aspirin EC, buPROPion, Clobetasol Propionate, dicyclomine, gabapentin, lisinopril-hydrochlorothiazide, methocarbamol, metoprolol tartrate, omeprazole, promethazine, rosuvastatin, ondansetron, orphenadrine, levothyroxine, traMADol, traMADol, traMADol, and sertraline.  Meds ordered this encounter  Medications  . clonazePAM (KLONOPIN) 1 MG tablet    Sig: Take 1 tablet (1 mg total) by mouth 2 (two) times daily as needed for anxiety.    Dispense:  30 tablet    Refill:  1    Medications Discontinued During This Encounter  Medication Reason  . meloxicam (MOBIC) 15 MG tablet Patient has not taken in last 30 days  . naproxen (NAPROSYN) 500 MG tablet Patient has not taken in last 30 days  . oxyCODONE-acetaminophen (ROXICET) 5-325 MG tablet Patient has not taken in last 30 days  . Polyethyl Glycol-Propyl Glycol (SYSTANE OP) Patient has not taken in last 30 days  . polyethylene glycol (MIRALAX / GLYCOLAX) packet Patient has not taken in last 30 days  . triamcinolone ointment (KENALOG) 0.1 % Patient has not taken in last 30 days  . ALPRAZolam (XANAX) 0.5 MG tablet     Follow-up: No Follow-up on file.   [name], MD

## 2016-09-06 NOTE — Patient Instructions (Signed)
I am prescribing an alternative to alprazolam to help control your anxiety better   The new medication  is called clonazepam and lasts longer than alprazolam , so you can use it twice daily .   DO NOT TAKE BOTH.   If you are not feeling more focuses and refreshed after 2 weeks ,  We need to change your medication

## 2016-09-08 MED ORDER — HALOBETASOL PROPIONATE 0.05 % EX CREA: TOPICAL_CREAM | Freq: Two times a day (BID) | CUTANEOUS | 0 refills | Status: DC

## 2016-09-08 NOTE — Telephone Encounter (Signed)
Request for clobetasol 0.05 % liquid is denied.   Augmented betamethasone 0.05% gel/ointment, Halobetasol 0.05 % cream/ointment  Is preferred .  Please advise.

## 2016-09-08 NOTE — Addendum Note (Signed)
Addended by: [name] on: 09/08/2016 04:28 PM   Modules accepted: Orders

## 2016-09-08 NOTE — Telephone Encounter (Signed)
Patient advised and verbalized understanding 

## 2016-09-08 NOTE — Telephone Encounter (Signed)
Halobetasol cream

## 2016-09-09 DIAGNOSIS — F0632 Mood disorder due to known physiological condition with major depressive-like episode: Secondary | ICD-10-CM | POA: Insufficient documentation

## 2016-09-09 NOTE — Assessment & Plan Note (Signed)
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  Advised to stop alprazolam and start clonazepam twice daily to avoid overuse of alprazolam.  Continue sertraline

## 2016-09-09 NOTE — Assessment & Plan Note (Signed)
CT cervical spine was repeated in February due to persistent headaches and neck pain since his fall from the ladder at work in [place].  No canal stenosis was noted  although multilevel degenerative changes were noted. ,

## 2016-09-09 NOTE — Assessment & Plan Note (Addendum)
Continue wellbutrin 150 mg bid  And zoloft 100 mg daily .  He is not suicidal based on conversation today. He has deferred referral to psychiatry/psycjology at this time

## 2016-09-09 NOTE — Assessment & Plan Note (Signed)
I have addressed  BMI and recommended a low glycemic index diet utilizing smaller more frequent meals to increase metabolism.  I have also recommended that patient start exercising with a goal of 30 minutes of aerobic exercise a minimum of 5 days per week.  

## 2016-09-19 ENCOUNTER — Encounter: Payer: Self-pay | Admitting: Internal Medicine

## 2016-09-19 NOTE — Telephone Encounter (Signed)
He is taking 1 mg twice a day, prior prescription was for only 30pills so he is requesting a 30day supply for twice a day dosing. thanks

## 2016-09-21 MED ORDER — CLONAZEPAM 1 MG PO TABS: 1.0000 mg | ORAL_TABLET | Freq: Two times a day (BID) | ORAL | 2 refills | Status: DC | PRN

## 2016-09-21 NOTE — Telephone Encounter (Signed)
Refilled for #60/month 2 refills

## 2016-10-22 ENCOUNTER — Other Ambulatory Visit: Payer: Self-pay | Admitting: Internal Medicine

## 2016-10-22 DIAGNOSIS — F411 Generalized anxiety disorder: Secondary | ICD-10-CM

## 2016-10-23 ENCOUNTER — Ambulatory Visit (INDEPENDENT_AMBULATORY_CARE_PROVIDER_SITE_OTHER): Payer: BLUE CROSS/BLUE SHIELD | Admitting: Internal Medicine

## 2016-10-23 ENCOUNTER — Encounter: Payer: Self-pay | Admitting: Internal Medicine

## 2016-10-23 VITALS — BP 110/64 | HR 95 | Temp 98.3°F | Resp 15 | Ht 72.0 in | Wt 219.6 lb

## 2016-10-23 DIAGNOSIS — R419 Unspecified symptoms and signs involving cognitive functions and awareness: Secondary | ICD-10-CM

## 2016-10-23 DIAGNOSIS — E1169 Type 2 diabetes mellitus with other specified complication: Secondary | ICD-10-CM | POA: Diagnosis not present

## 2016-10-23 DIAGNOSIS — L03116 Cellulitis of left lower limb: Secondary | ICD-10-CM

## 2016-10-23 DIAGNOSIS — Z79899 Other long term (current) drug therapy: Secondary | ICD-10-CM

## 2016-10-23 DIAGNOSIS — E669 Obesity, unspecified: Secondary | ICD-10-CM | POA: Diagnosis not present

## 2016-10-23 DIAGNOSIS — F411 Generalized anxiety disorder: Secondary | ICD-10-CM | POA: Diagnosis not present

## 2016-10-23 HISTORY — DX: Unspecified symptoms and signs involving cognitive functions and awareness: R41.9

## 2016-10-23 LAB — HEMOGLOBIN A1C: HEMOGLOBIN A1C: 6 % (ref 4.6–6.5)

## 2016-10-23 LAB — COMPREHENSIVE METABOLIC PANEL
ALBUMIN: 4.4 g/dL (ref 3.5–5.2)
ALK PHOS: 81 U/L (ref 39–117)
ALT: 27 U/L (ref 0–53)
AST: 25 U/L (ref 0–37)
BUN: 19 mg/dL (ref 6–23)
CALCIUM: 9.6 mg/dL (ref 8.4–10.5)
CHLORIDE: 101 meq/L (ref 96–112)
CO2: 29 mEq/L (ref 19–32)
CREATININE: 1.01 mg/dL (ref 0.40–1.50)
GFR: 82.46 mL/min (ref >60.00)
Glucose, Bld: 80 mg/dL (ref 70–99)
POTASSIUM: 5.1 meq/L (ref 3.5–5.1)
SODIUM: 137 meq/L (ref 135–145)
TOTAL PROTEIN: 7.1 g/dL (ref 6.0–8.3)
Total Bilirubin: 0.5 mg/dL (ref 0.2–1.2)

## 2016-10-23 MED ORDER — CEPHALEXIN 500 MG PO CAPS: 500.0000 mg | ORAL_CAPSULE | Freq: Four times a day (QID) | ORAL | 0 refills | Status: DC

## 2016-10-23 MED ORDER — SERTRALINE HCL 100 MG PO TABS: 150.0000 mg | ORAL_TABLET | Freq: Every day | ORAL | 2 refills | Status: DC

## 2016-10-23 NOTE — Assessment & Plan Note (Signed)
His MMSE was 30/30.  Reassured that his anxiety was more likely the cause of his concentration deficits.

## 2016-10-23 NOTE — Assessment & Plan Note (Signed)
Secondary to tick bite. Keflex prescribed

## 2016-10-23 NOTE — Patient Instructions (Addendum)
Your memory is fine !  It's your anxiety that's making your memory a problem.  I want you to increase the zoloft to 150 mg daily .  If you develop agitation (increased nervousness( twitching or fevers,  STOP IT IMMEDIATELY ) and let me know

## 2016-10-23 NOTE — Progress Notes (Signed)
Subjective:  Patient ID: [name], male    DOB: 10/14/1965  Age: 51 y.o. MRN: [id number]  CC: The primary encounter diagnosis was Diabetes mellitus type 2 in obese ([place]). Diagnoses of Generalized anxiety disorder, Long-term use of high-risk medication, Cognitive complaints with normal neuropsychological exam, Cellulitis of left thigh, and Anxiety state were also pertinent to this visit.  HPI  [name] presents for evaluation of memory loss. Patient has FH of alzheimers Dementia and is concerned he has iinheridted a congnitive disorder  Patient  has GAD complicated by loss of work due to occupation injury at work [employer] comp  Has trouble finding words when speaking , random occurrences/words,   loses train of thought while talking ,  Feels like he is starting to stutter. Very anxious  Sleep ok averaging 7 to 8 hours,  Unemployed so waking up naturally .  "hard for me to be happy" at times, not all the time,  Enjoys social events.  Gets unhappy when alone .   2) GAD:   Taking wellbutrin twice daily,  zoloft at night and clonazepam twice daily    3) left groin cellultiis after tick bite.  No fevers, myalgias or headaches    Outpatient Medications Prior to Visit  Medication Sig Dispense Refill  . aspirin EC 81 MG tablet Take 81 mg by mouth daily.    Marland Kitchen BAYER MICROLET LANCETS lancets Use as instructed 100 each 12  . buPROPion (WELLBUTRIN SR) 150 MG 12 hr tablet Take 1 tablet (150 mg total) by mouth 2 (two) times daily. 60 tablet 5  . Clobetasol Propionate 0.05 % shampoo Apply to dry scalp and leave on for 15 minutes 118 mL 5  . clonazePAM (KLONOPIN) 1 MG tablet Take 1 tablet (1 mg total) by mouth 2 (two) times daily as needed for anxiety. 60 tablet 2  . dicyclomine (BENTYL) 20 MG tablet Take 1 tablet (20 mg total) by mouth every 6 (six) hours. 120 tablet 5  . gabapentin (NEURONTIN) 100 MG capsule TAKE ONE CAPSULE BY MOUTH 3 TIMES A DAY 90 capsule 5  . halobetasol  (ULTRAVATE) 0.05 % cream Apply topically 2 (two) times daily. 50 g 0  . ibuprofen (ADVIL,MOTRIN) 200 MG tablet Take 400 mg by mouth every 6 (six) hours as needed (pain).    Marland Kitchen levothyroxine (SYNTHROID, LEVOTHROID) 88 MCG tablet TAKE 1 TABLET BY MOUTH DAILY BEFORE BREAKFAST. 30 tablet 4  . lisinopril-hydrochlorothiazide (PRINZIDE,ZESTORETIC) 20-25 MG tablet Take 1 tablet by mouth daily. 30 tablet 5  . methocarbamol (ROBAXIN) 500 MG tablet Take 1 tablet (500 mg total) by mouth every 8 (eight) hours as needed. 90 tablet 5  . metoprolol tartrate (LOPRESSOR) 25 MG tablet Take 1 tablet (25 mg total) by mouth 2 (two) times daily. 60 tablet 5  . Multiple Vitamin (MULTIVITAMIN WITH MINERALS) TABS tablet Take 1 tablet by mouth daily.    Marland Kitchen omeprazole (PRILOSEC) 40 MG capsule Take 1 capsule (40 mg total) by mouth 2 (two) times daily. 60 capsule 6  . ondansetron (ZOFRAN ODT) 4 MG disintegrating tablet Take 1 tablet (4 mg total) by mouth every 8 (eight) hours as needed for nausea or vomiting. 20 tablet 5  . orphenadrine (NORFLEX) 100 MG tablet Take 1 tablet (100 mg total) by mouth 2 (two) times daily. 10 tablet 0  . promethazine (PHENERGAN) 12.5 MG tablet Take 1 tablet (12.5 mg total) by mouth every 8 (eight) hours as needed for nausea or vomiting. 20 tablet 0  .  rosuvastatin (CRESTOR) 20 MG tablet Take 1 tablet (20 mg total) by mouth daily. 90 tablet 2  . traMADol (ULTRAM) 50 MG tablet TAKE 1 TABLET BY MOUTH EVERY 8 HOURS AS NEEDED 90 tablet 5  . sertraline (ZOLOFT) 100 MG tablet TAKE 1 TABLET BY MOUTH EVERY DAY 30 tablet 0  . traMADol (ULTRAM) 50 MG tablet Take 1 tablet (50 mg total) by mouth every 8 (eight) hours as needed. for pain (Patient not taking: Reported on 09/06/2016) 90 tablet 0  . traMADol (ULTRAM) 50 MG tablet TAKE 1 TABLET BY MOUTH EVERY 8 HOURS AS NEEDED FOR PAIN (Patient not taking: Reported on 09/06/2016) 90 tablet 1   No facility-administered medications prior to visit.     Review of  Systems;  Patient denies headache, fevers, malaise, unintentional weight loss, skin rash, eye pain, sinus congestion and sinus pain, sore throat, dysphagia,  hemoptysis , cough, dyspnea, wheezing, chest pain, palpitations, orthopnea, edema, abdominal pain, nausea, melena, diarrhea, constipation, flank pain, dysuria, hematuria, urinary  Frequency, nocturia, numbness, tingling, seizures,  Focal weakness, Loss of consciousness,  Tremor, insomnia, depression, anxiety, and suicidal ideation.      Objective:  BP 110/64 (BP Location: Left Arm, Patient Position: Sitting, Cuff Size: Normal)   Pulse 95   Temp 98.3 F (36.8 C) (Oral)   Resp 15   Ht 6' (1.829 m)   Wt 219 lb 9.6 oz (99.6 kg)   SpO2 95%   BMI 29.78 kg/m   BP Readings from Last 3 Encounters:  10/23/16 110/64  09/06/16 (!) 142/88  06/10/16 123/72    Wt Readings from Last 3 Encounters:  10/23/16 219 lb 9.6 oz (99.6 kg)  09/06/16 224 lb 9.6 oz (101.9 kg)  06/10/16 210 lb (95.3 kg)    General appearance: alert, cooperative and appears stated age Ears: normal TM's and external ear canals both ears Throat: lips, mucosa, and tongue normal; teeth and gums normal Neck: no adenopathy, no carotid bruit, supple, symmetrical, trachea midline and thyroid not enlarged, symmetric, no tenderness/mass/nodules Back: symmetric, no curvature. ROM normal. No CVA tenderness. Lungs: clear to auscultation bilaterally Heart: regular rate and rhythm, S1, S2 normal, no murmur, click, rub or gallop Abdomen: soft, non-tender; bowel sounds normal; no masses,  no organomegaly Pulses: 2+ and symmetric Skin: Skin color, texture, turgor normal. No rashes or lesions Lymph nodes: Cervical, supraclavicular, and axillary nodes normal.  Lab Results  Component Value Date   HGBA1C 6.0 10/23/2016   HGBA1C 6.1 06/05/2016   HGBA1C 6.0 09/13/2015    Lab Results  Component Value Date   CREATININE 1.01 10/23/2016   CREATININE 0.93 05/02/2016   CREATININE 0.98  04/29/2016    Lab Results  Component Value Date   WBC 5.9 05/02/2016   HGB 15.3 05/02/2016   HCT 44.8 05/02/2016   PLT 298 05/02/2016   GLUCOSE 80 10/23/2016   CHOL 169 06/05/2016   TRIG 157.0 (H) 06/05/2016   HDL 47.00 06/05/2016   LDLDIRECT 110.0 06/05/2016   LDLCALC 91 06/05/2016   ALT 27 10/23/2016   AST 25 10/23/2016   NA 137 10/23/2016   K 5.1 10/23/2016   CL 101 10/23/2016   CREATININE 1.01 10/23/2016   BUN 19 10/23/2016   CO2 29 10/23/2016   TSH 1.72 06/05/2016   PSA 0.65 03/30/2015   INR 0.98 08/27/2015   HGBA1C 6.0 10/23/2016   MICROALBUR <0.7 09/13/2015    Ct Head Wo Contrast  Result Date: 06/10/2016 CLINICAL DATA:  Patient status post fall from  ladder 01/2016. Worsening headache and neck pain. EXAM: CT HEAD WITHOUT CONTRAST CT CERVICAL SPINE WITHOUT CONTRAST TECHNIQUE: Multidetector CT imaging of the head and cervical spine was performed following the standard protocol without intravenous contrast. Multiplanar CT image reconstructions of the cervical spine were also generated. COMPARISON:  MRI cervical spine 11/26/2013; CT brain 09/20/2007. FINDINGS: CT HEAD FINDINGS Brain: Ventricles and sulci are appropriate for patient's age. No evidence for acute cortically based infarct, intracranial hemorrhage, mass lesion or mass-effect. Vascular: Unremarkable. Skull: Intact. Sinuses/Orbits: Paranasal sinuses are well aerated. Mastoid air cells are unremarkable. Other: None. CT CERVICAL SPINE FINDINGS Alignment: Reversal of the normal cervical lordosis. Multilevel degenerative disc disease. Multilevel facet degenerative changes. Preservation of the vertebral body heights. Skull base and vertebrae: No acute fracture. No primary bone lesion or focal pathologic process. Soft tissues and spinal canal: No prevertebral fluid or swelling. No visible canal hematoma. Disc levels: Multilevel degenerative disc disease. 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 Upper chest:  Unremarkable. Other: None. IMPRESSION: No acute intracranial process. No acute cervical spine fracture. Electronically Signed   By: Lovey Newcomer M.D.   On: 06/10/2016 12:23   Ct Cervical Spine Wo Contrast  Result Date: 06/10/2016 CLINICAL DATA:  Patient status post fall from ladder 01/2016. Worsening headache and neck pain. EXAM: CT HEAD WITHOUT CONTRAST CT CERVICAL SPINE WITHOUT CONTRAST TECHNIQUE: Multidetector CT imaging of the head and cervical spine was performed following the standard protocol without intravenous contrast. Multiplanar CT image reconstructions of the cervical spine were also generated. COMPARISON:  MRI cervical spine 11/26/2013; CT brain 09/20/2007. FINDINGS: CT HEAD FINDINGS Brain: Ventricles and sulci are appropriate for patient's age. No evidence for acute cortically based infarct, intracranial hemorrhage, mass lesion or mass-effect. Vascular: Unremarkable. Skull: Intact. Sinuses/Orbits: Paranasal sinuses are well aerated. Mastoid air cells are unremarkable. Other: None. CT CERVICAL SPINE FINDINGS Alignment: Reversal of the normal cervical lordosis. Multilevel degenerative disc disease. Multilevel facet degenerative changes. Preservation of the vertebral body heights. Skull base and vertebrae: No acute fracture. No primary bone lesion or focal pathologic process. Soft tissues and spinal canal: No prevertebral fluid or swelling. No visible canal hematoma. Disc levels: Multilevel degenerative disc disease. 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 Upper chest: Unremarkable. Other: None. IMPRESSION: No acute intracranial process. No acute cervical spine fracture. Electronically Signed   By: Lovey Newcomer M.D.   On: 06/10/2016 12:23    Assessment & Plan:   Problem List Items Addressed This Visit    Anxiety state    Increasing zoloft to 150 mg daily       Relevant Medications   sertraline (ZOLOFT) 100 MG tablet   Diabetes mellitus type 2 in obese ([facility]) - Primary   Relevant  Orders   Hemoglobin A1c (Completed)   Cognitive complaints with normal neuropsychological exam    His MMSE was 30/30.  Reassured that his anxiety was more likely the cause of his concentration deficits.        Cellulitis of left thigh    Secondary to tick bite. Keflex prescribed        Other Visit Diagnoses    Generalized anxiety disorder       Relevant Medications   sertraline (ZOLOFT) 100 MG tablet   Long-term use of high-risk medication       Relevant Orders   Comprehensive metabolic panel (Completed)     A total of 25 minutes of face to face time was spent with patient more than half of which was spent  in counselling about the above mentioned conditions  and coordination of care  I have changed Mr. Teed's sertraline. I am also having him start on cephALEXin. Additionally, I am having him maintain his BAYER MICROLET LANCETS, multivitamin with minerals, ibuprofen, aspirin EC, buPROPion, Clobetasol Propionate, dicyclomine, gabapentin, lisinopril-hydrochlorothiazide, methocarbamol, metoprolol tartrate, omeprazole, promethazine, rosuvastatin, ondansetron, orphenadrine, levothyroxine, traMADol, halobetasol, and clonazePAM.  Meds ordered this encounter  Medications  . cephALEXin (KEFLEX) 500 MG capsule    Sig: Take 1 capsule (500 mg total) by mouth 4 (four) times daily.    Dispense:  28 capsule    Refill:  0  . sertraline (ZOLOFT) 100 MG tablet    Sig: Take 1.5 tablets (150 mg total) by mouth daily.    Dispense:  45 tablet    Refill:  2    Medications Discontinued During This Encounter  Medication Reason  . traMADol (ULTRAM) 50 MG tablet Patient has not taken in last 30 days  . traMADol (ULTRAM) 50 MG tablet Duplicate  . sertraline (ZOLOFT) 100 MG tablet Reorder    Follow-up: Return in about 3 months (around 01/23/2017).   [name], MD

## 2016-10-23 NOTE — Assessment & Plan Note (Signed)
Increasing zoloft to 150 mg daily

## 2016-10-24 ENCOUNTER — Encounter: Payer: Self-pay | Admitting: Internal Medicine

## 2016-11-19 ENCOUNTER — Other Ambulatory Visit: Payer: Self-pay | Admitting: Internal Medicine

## 2016-11-19 DIAGNOSIS — F411 Generalized anxiety disorder: Secondary | ICD-10-CM

## 2016-11-20 ENCOUNTER — Other Ambulatory Visit: Payer: Self-pay | Admitting: Internal Medicine

## 2016-11-20 ENCOUNTER — Emergency Department: Payer: BLUE CROSS/BLUE SHIELD

## 2016-11-20 ENCOUNTER — Encounter: Payer: Self-pay | Admitting: Emergency Medicine

## 2016-11-20 ENCOUNTER — Emergency Department
Admission: EM | Admit: 2016-11-20 | Discharge: 2016-11-20 | Disposition: A | Discharge status: Home / Self Care | Payer: BLUE CROSS/BLUE SHIELD | Attending: Emergency Medicine | Admitting: Emergency Medicine

## 2016-11-20 DIAGNOSIS — F411 Generalized anxiety disorder: Secondary | ICD-10-CM

## 2016-11-20 DIAGNOSIS — E119 Type 2 diabetes mellitus without complications: Secondary | ICD-10-CM | POA: Diagnosis not present

## 2016-11-20 DIAGNOSIS — R1011 Right upper quadrant pain: Secondary | ICD-10-CM

## 2016-11-20 DIAGNOSIS — Z96651 Presence of right artificial knee joint: Secondary | ICD-10-CM | POA: Diagnosis not present

## 2016-11-20 DIAGNOSIS — E039 Hypothyroidism, unspecified: Secondary | ICD-10-CM | POA: Diagnosis not present

## 2016-11-20 DIAGNOSIS — K859 Acute pancreatitis without necrosis or infection, unspecified: Secondary | ICD-10-CM | POA: Diagnosis not present

## 2016-11-20 DIAGNOSIS — K860 Alcohol-induced chronic pancreatitis: Secondary | ICD-10-CM

## 2016-11-20 DIAGNOSIS — I10 Essential (primary) hypertension: Secondary | ICD-10-CM | POA: Insufficient documentation

## 2016-11-20 DIAGNOSIS — R079 Chest pain, unspecified: Secondary | ICD-10-CM | POA: Diagnosis present

## 2016-11-20 DIAGNOSIS — Z87891 Personal history of nicotine dependence: Secondary | ICD-10-CM | POA: Diagnosis not present

## 2016-11-20 DIAGNOSIS — Z79899 Other long term (current) drug therapy: Secondary | ICD-10-CM | POA: Diagnosis not present

## 2016-11-20 DIAGNOSIS — Z7982 Long term (current) use of aspirin: Secondary | ICD-10-CM | POA: Insufficient documentation

## 2016-11-20 HISTORY — DX: Alcohol-induced chronic pancreatitis: K86.0

## 2016-11-20 LAB — CBC
HCT: 41.7 % (ref 40.0–52.0)
HEMOGLOBIN: 14.1 g/dL (ref 13.0–18.0)
MCH: 32.3 pg (ref 26.0–34.0)
MCHC: 33.9 g/dL (ref 32.0–36.0)
MCV: 95.1 fL (ref 80.0–100.0)
PLATELETS: 266 10*3/uL (ref 150–440)
RBC: 4.39 MIL/uL — AB (ref 4.40–5.90)
RDW: 13.3 % (ref 11.5–14.5)
WBC: 6.8 10*3/uL (ref 3.8–10.6)

## 2016-11-20 LAB — BASIC METABOLIC PANEL
ANION GAP: 6 (ref 5–15)
BUN: 30 mg/dL — ABNORMAL HIGH (ref 6–20)
CHLORIDE: 103 mmol/L (ref 101–111)
CO2: 25 mmol/L (ref 22–32)
CREATININE: 1.33 mg/dL — AB (ref 0.61–1.24)
Calcium: 9.1 mg/dL (ref 8.9–10.3)
GFR calc non Af Amer: 60 mL/min — ABNORMAL LOW (ref >60)
Glucose, Bld: 128 mg/dL — ABNORMAL HIGH (ref 65–99)
Potassium: 4.3 mmol/L (ref 3.5–5.1)
SODIUM: 134 mmol/L — AB (ref 135–145)

## 2016-11-20 LAB — HEPATIC FUNCTION PANEL
ALT: 26 U/L (ref 17–63)
AST: 29 U/L (ref 15–41)
Albumin: 4.3 g/dL (ref 3.5–5.0)
Alkaline Phosphatase: 107 U/L (ref 38–126)
BILIRUBIN TOTAL: 0.6 mg/dL (ref 0.3–1.2)
Total Protein: 7.4 g/dL (ref 6.5–8.1)

## 2016-11-20 LAB — TROPONIN I: Troponin I: <0.03 ng/mL (ref <0.03)

## 2016-11-20 LAB — LIPASE, BLOOD: LIPASE: 274 U/L — AB (ref 11–51)

## 2016-11-20 MED ORDER — MORPHINE SULFATE (PF) 4 MG/ML IV SOLN
4.0000 mg | Freq: Once | INTRAVENOUS | Status: AC
  Administered 2016-11-20: 4 mg via INTRAVENOUS
  Filled 2016-11-20: qty 1

## 2016-11-20 MED ORDER — OXYCODONE-ACETAMINOPHEN 5-325 MG PO TABS: 1.0000 | ORAL_TABLET | Freq: Four times a day (QID) | ORAL | 0 refills | Status: DC | PRN

## 2016-11-20 MED ORDER — ONDANSETRON HCL 4 MG/2ML IJ SOLN
4.0000 mg | Freq: Once | INTRAMUSCULAR | Status: AC
  Administered 2016-11-20: 4 mg via INTRAVENOUS
  Filled 2016-11-20: qty 2

## 2016-11-20 MED ORDER — ACETAMINOPHEN 500 MG PO TABS
1000.0000 mg | ORAL_TABLET | Freq: Once | ORAL | Status: AC
  Administered 2016-11-20: 1000 mg via ORAL
  Filled 2016-11-20: qty 2

## 2016-11-20 MED ORDER — ONDANSETRON HCL 4 MG PO TABS: 4.0000 mg | ORAL_TABLET | Freq: Three times a day (TID) | ORAL | 0 refills | Status: DC | PRN

## 2016-11-20 MED ORDER — OXYCODONE HCL 5 MG PO TABS
5.0000 mg | ORAL_TABLET | Freq: Once | ORAL | Status: AC
  Administered 2016-11-20: 5 mg via ORAL
  Filled 2016-11-20: qty 1

## 2016-11-20 MED ORDER — IOPAMIDOL (ISOVUE-300) INJECTION 61%
100.0000 mL | Freq: Once | INTRAVENOUS | Status: AC | PRN
  Administered 2016-11-20: 100 mL via INTRAVENOUS

## 2016-11-20 MED ORDER — SODIUM CHLORIDE 0.9 % IV BOLUS (SEPSIS)
1000.0000 mL | Freq: Once | INTRAVENOUS | Status: AC
  Administered 2016-11-20: 1000 mL via INTRAVENOUS

## 2016-11-20 NOTE — Telephone Encounter (Signed)
This was prescribed on in February 2018, he is currently in the ED.  Please advise, thanks

## 2016-11-20 NOTE — ED Provider Notes (Signed)
[facility] Emergency Department Provider Note  ____________________________________________  Time seen: Approximately 6:09 PM  I have reviewed the triage vital signs and the nursing notes.   HISTORY  Chief Complaint Chest Pain   HPI [name] is a 51 y.o. male with a history of fatty liver, GERD, and hypertension who presents for evaluation of abdominal pain. Patient reports that at 1:30 he had a biscuit sandwich and an hour later started having pain located underneath his chest bone. He reports the pain as a constant cramping radiating into his back between his shoulder blades associated with significant nausea. He reports that over the course of the last few hours the pain has now migrated diffusely to the left abdominal region. He is complaining currently of 8 out of 10 sharp pain located in his epigastric and left quadrants, constant and nonradiating. No vomiting, no diarrhea or constipation, no fever or chills, no dysuria or hematuria, no shortness of breath, no cough or congestion. Patient reports a similar episode a week ago however that one lasted an hour and resolved without intervention and patient did not seek care. His only prior abdominal surgery was an appendectomy many years ago. Patient denies heavy alcohol use or NSAID use.  Past Medical History:  Diagnosis Date  . Anxiety   . Anxiety and depression   . Bruit    L  . Chest pain    hx  . Diabetes mellitus without complication ([place])    "borderline"  . Edema   . Fatty liver   . GERD (gastroesophageal reflux disease)    uses Omeprazole  . Goiter   . HLD (hyperlipidemia)   . Hypertension    essential, benign  . Hypothyroidism   . Impotence of organic origin   . Murmur   . Other chest pain    tightness, pressure  . Palpitation    hx  . Precordial pain   . Sleep apnea    uses cpap    Patient Active Problem List   Diagnosis Date Noted  . Cognitive complaints with normal  neuropsychological exam 10/23/2016  . Cellulitis of left thigh 10/23/2016  . 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 Hypothyroidism due to acquired atrophy of thyroid 09/14/2015  . Arthritis of knee 06/23/2014  . Pre-operative cardiovascular examination 06/17/2014  . Enlarged RV (right ventricle) 06/17/2014  . Cervical spine degeneration 02/17/2014  . Overweight 10/29/2013  . Chronic knee pain 10/29/2013  . Umbilical hernia 64/33/2951  . Diabetes mellitus type 2 in obese ([place]) 10/23/2012  . Seborrheic dermatitis, unspecified 05/17/2012  . Fatty liver 04/22/2012  . Polyarthralgia 12/24/2010  . Sciatica of right side 12/24/2010  . Hyperlipidemia associated with type 2 diabetes mellitus ([place]) 01/13/2009  . Anxiety state 01/13/2009  . Essential hypertension 01/13/2009  . OSA (obstructive sleep apnea) 01/13/2009    Past Surgical History:  Procedure Laterality Date  . APPENDECTOMY  2002   done at [facility]  . CARDIAC CATHETERIZATION    . CARDIAC CATHETERIZATION N/A 08/27/2015   Procedure: Left Heart Cath and Coronary Angiography;  Surgeon: Jolaine Artist, MD;  Location: [place] [facility];  Service: Cardiovascular;  Laterality: N/A;  . COLONOSCOPY    . HERNIA REPAIR  8-84-16   umbilical  . JOINT REPLACEMENT     right knee  . REVISION TOTAL KNEE ARTHROPLASTY Right 06/23/2014   DR Marlou Sa  .  TONSILLECTOMY    . TOTAL KNEE ARTHROPLASTY Right 2011   right  . TOTAL KNEE REVISION Right 06/23/2014   Procedure: TOTAL KNEE REVISION;  Surgeon: Meredith Pel, MD;  Location: [place];  Service: Orthopedics;  Laterality: Right;    Prior to Admission medications   Medication Sig Start Date End Date Taking? Authorizing Provider  aspirin EC 81 MG tablet Take 81 mg by mouth daily.    [provider]  BAYER MICROLET LANCETS lancets Use as instructed 05/30/13   [name], MD  buPROPion [facility] SR) 150 MG 12 hr tablet Take 1 tablet (150 mg total) by mouth 2 (two) times daily. 06/05/16   [name], MD  cephALEXin (KEFLEX) 500 MG capsule Take 1 capsule (500 mg total) by mouth 4 (four) times daily. 10/23/16   [name], MD  Clobetasol Propionate 0.05 % shampoo Apply to dry scalp and leave on for 15 minutes 06/05/16   [name], MD  clonazePAM (KLONOPIN) 1 MG tablet Take 1 tablet (1 mg total) by mouth 2 (two) times daily as needed for anxiety. 09/21/16   [name], MD  dicyclomine (BENTYL) 20 MG tablet Take 1 tablet (20 mg total) by mouth every 6 (six) hours. 06/05/16   [name], MD  gabapentin (NEURONTIN) 100 MG capsule TAKE ONE CAPSULE BY MOUTH 3 TIMES A DAY 06/05/16   [name], MD  halobetasol (ULTRAVATE) 0.05 % cream Apply topically 2 (two) times daily. 09/08/16   [name], MD  ibuprofen (ADVIL,MOTRIN) 200 MG tablet Take 400 mg by mouth every 6 (six) hours as needed (pain).    [provider]  levothyroxine (SYNTHROID, LEVOTHROID) 88 MCG tablet TAKE 1 TABLET BY MOUTH DAILY BEFORE BREAKFAST. 06/20/16   [name], MD  lisinopril-hydrochlorothiazide (PRINZIDE,ZESTORETIC) 20-25 MG tablet Take 1 tablet by mouth daily. 06/05/16   [name], MD  methocarbamol (ROBAXIN) 500 MG tablet Take 1 tablet (500 mg total) by mouth every 8 (eight) hours as needed. 06/05/16   [name], MD  metoprolol tartrate (LOPRESSOR) 25 MG tablet Take 1 tablet (25 mg total) by mouth 2 (two) times daily. 06/05/16   [name], MD  Multiple Vitamin (MULTIVITAMIN WITH MINERALS) TABS tablet Take 1 tablet by mouth daily.    [provider]  omeprazole (PRILOSEC) 40 MG capsule Take 1 capsule (40 mg total) by mouth 2 (two) times daily. 06/05/16   [name], MD  ondansetron (ZOFRAN ODT) 4 MG disintegrating tablet Take 1 tablet (4 mg total) by mouth every 8 (eight) hours as needed for nausea or vomiting. 06/05/16   [name], MD  ondansetron (ZOFRAN) 4 MG tablet Take 1 tablet (4 mg total) by mouth every 8 (eight) hours as needed for nausea or vomiting. 11/20/16   Alfred Levins, [place], MD  orphenadrine (NORFLEX) 100 MG tablet Take 1 tablet (100 mg total) by mouth 2 (two) times daily. 06/10/16   Sable Feil, PA-C  oxyCODONE-acetaminophen (ROXICET) 5-325 MG tablet Take 1 tablet by mouth every 6 (six) hours as needed. 11/20/16 11/20/17  Rudene Re, MD  promethazine (PHENERGAN) 12.5 MG tablet Take 1 tablet (12.5 mg total) by mouth every 8 (eight) hours as needed for nausea or vomiting. 06/05/16   [name], MD  rosuvastatin (CRESTOR) 20 MG tablet Take 1 tablet (20 mg total) by mouth daily. 06/05/16   [name], MD  sertraline (ZOLOFT) 100 MG tablet Take  1.5 tablets (150 mg total) by mouth daily. 10/23/16   [name], MD  traMADol (ULTRAM) 50 MG tablet TAKE 1 TABLET BY MOUTH EVERY 8 HOURS AS NEEDED 07/05/16   [name], MD    Allergies Patient has no known allergies.  Family History  Problem Relation Age of Onset  . Heart disease Father     Social History Social History  Substance Use Topics  . Smoking status: Former Smoker    Packs/day: 2.00    Years: 18.00    Types: Cigarettes    Quit date: 04/24/2005  . Smokeless tobacco: Never Used     Comment: quit 20 years ago   . Alcohol use 10.2 oz/week    3 Shots of liquor, 14 Standard drinks or equivalent per week     Comment: 1 a day    Review of Systems  Constitutional: Negative for fever. Eyes: Negative for visual changes. ENT: Negative for sore throat. Neck: No neck pain  Cardiovascular: Negative for chest pain. Respiratory: Negative for shortness of breath. Gastrointestinal: + abdominal pain and nausea. No vomiting or diarrhea. Genitourinary: Negative for dysuria. Musculoskeletal: Negative for back pain. Skin: Negative for rash. Neurological: Negative for headaches, weakness or numbness. Psych: No SI or  HI  ____________________________________________   PHYSICAL EXAM:  VITAL SIGNS: ED Triage Vitals  Enc Vitals Group     BP 11/20/16 1441 104/74     Pulse Rate 11/20/16 1441 87     Resp 11/20/16 1441 18     Temp 11/20/16 1441 98.4 F (36.9 C)     Temp Source 11/20/16 1441 Oral     SpO2 11/20/16 1441 100 %     Weight 11/20/16 1441 214 lb (97.1 kg)     Height 11/20/16 1441 6' (1.829 m)     Head Circumference --      Peak Flow --      Pain Score 11/20/16 1440 10     Pain Loc --      Pain Edu? --      Excl. in [place]? --     Constitutional: Alert and oriented. Well appearing and in no apparent distress. HEENT:      Head: Normocephalic and atraumatic.         Eyes: Conjunctivae are normal. Sclera is non-icteric.       Mouth/Throat: Mucous membranes are moist.       Neck: Supple with no signs of meningismus. Cardiovascular: Regular rate and rhythm. No murmurs, gallops, or rubs. 2+ symmetrical distal pulses are present in all extremities. No JVD. Respiratory: Normal respiratory effort. Lungs are clear to auscultation bilaterally. No wheezes, crackles, or rhonchi.  Gastrointestinal: Soft, ttp over the epigastric region/ LUQ/LLQ, and non distended with positive bowel sounds. No rebound or guarding. Musculoskeletal: Nontender with normal range of motion in all extremities. No edema, cyanosis, or erythema of extremities. Neurologic: Normal speech and language. Face is symmetric. Moving all extremities. No gross focal neurologic deficits are appreciated. Skin: Skin is warm, dry and intact. No rash noted. Psychiatric: Mood and affect are normal. Speech and behavior are normal.  ____________________________________________   LABS (all labs ordered are listed, but only abnormal results are displayed)  Labs Reviewed  BASIC METABOLIC PANEL - Abnormal; Notable for the following:       Result Value   Sodium 134 (*)    Glucose, Bld 128 (*)    BUN 30 (*)    Creatinine, Ser 1.33 (*)    GFR  calc non Af Amer 60 (*)    All other components within normal limits  CBC - Abnormal; Notable for the following:    RBC 4.39 (*)    All other components within normal limits  HEPATIC FUNCTION PANEL - Abnormal; Notable for the following:    Bilirubin, Direct <0.1 (*)    All other components within normal limits  LIPASE, BLOOD - Abnormal; Notable for the following:    Lipase 274 (*)    All other components within normal limits  TROPONIN I   ____________________________________________  EKG  ED ECG REPORT I, Rudene Re, the attending physician, personally viewed and interpreted this ECG.  Normal sinus rhythm, rate of 76, normal intervals, normal axis, no ST elevations or depressions, T-wave inversion in lead 3 and flattening in aVF. Unchanged from prior from May 2017 ____________________________________________  RADIOLOGY  CXR: 1. Mild inflammatory changes abutting the second portion of the duodenum most consistent with do the 90s. An early acute pancreatitis as the primary cause of the inflammation is less likely but not excluded. Correlation with pancreatic enzymes recommended. 2. Moderate colonic stool burden. No bowel obstruction. 3. Fatty liver. 4. Aortic Atherosclerosis   RUQ [place]: Negative for gallstones Fatty infiltration liver with focal fatty sparing adjacent to the gallbladder. ____________________________________________   PROCEDURES  Procedure(s) performed: None Procedures Critical Care performed:  None ____________________________________________   INITIAL IMPRESSION / ASSESSMENT AND PLAN / ED COURSE  51 y.o. male with a history of fatty liver, GERD, and hypertension who presents for evaluation of abdominal pain. Ddx gastritis, peptic ulcer disease, diverticulitis, gallbladder disease, pancreatitis, kidney stone. Right upper quadrant ultrasound with no evidence of cholelithiasis or cholecystitis. Blood work showing slightly elevated creatinine of  1.33 we'll give IV fluids, remaining of the BMP, CBC, and troponin were no acute findings. We'll get LFTs and lipase will send patient for CT abdomen and pelvis. We'll treat his symptoms with IV fluids, IV morphine, and IV Zofran.  _________________________ 8:31 PM on 11/20/2016 -----------------------------------------  CT and labs concerning for acute pancreatitis. Patient tells me that he drinks a couple of beers every night and may be a few more over the weekend. I explained to him that with a negative ultrasound the most likely cause of his pancreatitis is due to alcohol. His pain is well-controlled and is tolerating by mouth. To be discharged home on Percocet, Zofran, no alcohol, slowly advancing his diet, and follow-up with his primary care doctor. Counseling on alcohol cessation was given to the patient.    Pertinent labs & imaging results that were available during my care of the patient were reviewed by me and considered in my medical decision making (see chart for details).    ____________________________________________   FINAL CLINICAL IMPRESSION(S) / ED DIAGNOSES  Final diagnoses:  Acute pancreatitis without infection or necrosis, unspecified pancreatitis type      NEW MEDICATIONS STARTED DURING THIS VISIT:  New Prescriptions   ONDANSETRON (ZOFRAN) 4 MG TABLET    Take 1 tablet (4 mg total) by mouth every 8 (eight) hours as needed for nausea or vomiting.   OXYCODONE-ACETAMINOPHEN (ROXICET) 5-325 MG TABLET    Take 1 tablet by mouth every 6 (six) hours as needed.     Note:  This document was prepared using Dragon voice recognition software and may include unintentional dictation errors.    Rudene Re, MD 11/20/16 2038

## 2016-11-20 NOTE — ED Notes (Signed)
Patient instructed not to drive for 6 hours following administration of pain meds. Patient verbalized understanding and states he has a ride waiting in the lobby. Ambulatory to lobby with NAD noted.

## 2016-11-20 NOTE — ED Triage Notes (Signed)
Pt reports central and right chest pain that radiates to back that began today. Pt reports associated nausea. Pt reports having an episode similar to this several weeks ago but did not seek treatment. Pt states he still has his gallbladder.

## 2016-11-20 NOTE — Discharge Instructions (Signed)
Take pain medication and nausea medication as prescribed. Slowly advance your diet starting with clear liquids, followed by bland solids after 24 hours. Follow-up with your primary care doctor in 2 days. Return to the emergency room if your pain is getting worse, if your nausea is worse, if you have a fever. Stop alcohol intake for 2 weeks. Please cut on your alcohol intake as this is the most likely cause of your pancreatitis.

## 2016-11-21 NOTE — Telephone Encounter (Signed)
Denied.  He was sent home from ED with alcoholic pancreatitis and was given Zofran.  He should use zofran,  He will need an appointment  asap to discuss his alcohol and concurrent narcotics use.

## 2016-11-24 ENCOUNTER — Encounter: Payer: Self-pay | Admitting: Internal Medicine

## 2016-11-24 ENCOUNTER — Telehealth: Payer: Self-pay | Admitting: Internal Medicine

## 2016-11-24 ENCOUNTER — Ambulatory Visit (INDEPENDENT_AMBULATORY_CARE_PROVIDER_SITE_OTHER): Payer: BLUE CROSS/BLUE SHIELD | Admitting: Internal Medicine

## 2016-11-24 VITALS — BP 112/62 | HR 68 | Temp 98.6°F | Resp 15 | Ht 72.0 in | Wt 211.8 lb

## 2016-11-24 DIAGNOSIS — K860 Alcohol-induced chronic pancreatitis: Secondary | ICD-10-CM | POA: Diagnosis not present

## 2016-11-24 DIAGNOSIS — T783XXA Angioneurotic edema, initial encounter: Secondary | ICD-10-CM | POA: Diagnosis not present

## 2016-11-24 DIAGNOSIS — K649 Unspecified hemorrhoids: Secondary | ICD-10-CM | POA: Diagnosis not present

## 2016-11-24 DIAGNOSIS — K852 Alcohol induced acute pancreatitis without necrosis or infection: Secondary | ICD-10-CM | POA: Diagnosis not present

## 2016-11-24 DIAGNOSIS — K760 Fatty (change of) liver, not elsewhere classified: Secondary | ICD-10-CM

## 2016-11-24 LAB — LIPASE: Lipase: 47 U/L (ref 11.0–59.0)

## 2016-11-24 MED ORDER — METHOCARBAMOL 500 MG PO TABS: 500.0000 mg | ORAL_TABLET | Freq: Three times a day (TID) | ORAL | 5 refills | Status: DC | PRN

## 2016-11-24 MED ORDER — PRAMOXINE-HC 1-2 % EX GEL: 1.0000 "application " | Freq: Two times a day (BID) | CUTANEOUS | 3 refills | Status: DC

## 2016-11-24 MED ORDER — TRAMADOL HCL 50 MG PO TABS: 50.0000 mg | ORAL_TABLET | Freq: Three times a day (TID) | ORAL | 5 refills | Status: DC | PRN

## 2016-11-24 MED ORDER — PREDNISONE 10 MG PO TABS: ORAL_TABLET | ORAL | 0 refills | Status: DC

## 2016-11-24 MED ORDER — HYDROCORTISONE 2.5 % RE CREA: 1.0000 "application " | TOPICAL_CREAM | Freq: Two times a day (BID) | RECTAL | 0 refills | Status: DC

## 2016-11-24 NOTE — Telephone Encounter (Signed)
Pharmacy called and stated that pt needs a PA for his traMADol (ULTRAM) 50 MG tablet for more than 7 days at a time. Please advise, thank you!  PA phone # [phone]

## 2016-11-24 NOTE — Patient Instructions (Addendum)
yOUR CONSTIPATION IS AGGRAVATED BY USE OF OXYCODONE.  YOU NEED TO ADD  Dulcolax .  Ex lax or sennakot  S for your constipation .  CONTINUE STOOL SOFTENER  PRAMOXINE AND HYDROCORTISONE FOR YOUR BLEEDING HEMORRHOIDS,  REFERRAL TO DR BYRNETT IN PROGRESS.   You had AN ALLERGIC REACTION  TO AMBIEN   Benadryl can be taken every 6 to 8 hours for allergic reactions Continue zantac every 6 hours for the next 48 hours  You can start the prednisone taper today and reduce the benadryl to evening only starting tomorrow (use to help you sleep)  SIMPLIFY YOUR DIET TO CLEAR LIQUIDS FOR THE NEXT 24 HOURS,  IF YOUR PAIN DOES NOT RETURN,  YOU CAN ADVANCE TO GRITS AND EGGS AGAIN    YOU HAVE THREE 3 REASONS TO AVOID ALCOHOL:  IT CAUSED YOUR PANCREATITIS IT CAN ACCELERATE YOUR FATTY LIVER INTO CIRRHOSIS YOU ARE TAKING CONTROLLED SUBSTANCES THAT SHOULD NOT BE COMBINED WITH ALCOHOL (TRAMADOL AND CLONAZEPAM)

## 2016-11-24 NOTE — Progress Notes (Signed)
Subjective:  Patient ID: [name], male    DOB: 1965/06/27  Age: 51 y.o. MRN: [id number]  CC: The primary encounter diagnosis was Bleeding hemorrhoid. Diagnoses of Alcohol-induced chronic pancreatitis ([place]), Fatty liver, Alcohol-induced acute pancreatitis without infection or necrosis, and Angioedema of lips, initial encounter were also pertinent to this visit.  HPI [name] presents for ER follow up and acute allergic reaction.    1) patient [name] for the first time last night, (prescribed by [facility] MD Dr Christella Hartigan )   Woke up with swollen , numb upper lip.  States that his upper lip was so swollen it felt hard.   Patient was triaged by phone, no tongue swelling or trouble swallowing.  Told to take 25 mg benadryl and 75 mg zantac, which he did around 9 am  This morning.  Swelling has improved slightly and he feels sedated by the benadryl.   2) alcoholic pancreatitis/duodenitis : patient seen in ER on July 30 for abdominal pain and diagnosed with acute pancreatitis.  U/s was normal,  He  told the ER doctor that he drinks "a few beers " every night , and a few more over the weekend. He was advised to stop drinking alcohol.  Given rx for percocet and zofran . In the interim, patient requested a refill on phenergan from this office, which was denied   He states that his pain improved so he advanced his diet  to egg and grits, potatoes and chicken noodle soup. Currently notes that his pain returns about  10 to 15 minutes after eating.  severity 6-7/10 , eases off with rest after 30 minutes to an hour.   3) has been suffering from a bleeding  hemorrhoid  And his constipation has not responded to two doses of metamucil      Outpatient Medications Prior to Visit  Medication Sig Dispense Refill  . aspirin EC 81 MG tablet Take 81 mg by mouth daily.    Marland Kitchen BAYER MICROLET LANCETS lancets Use as instructed 100 each 12  . buPROPion (WELLBUTRIN SR) 150 MG 12 hr tablet Take 1 tablet  (150 mg total) by mouth 2 (two) times daily. 60 tablet 5  . cephALEXin (KEFLEX) 500 MG capsule Take 1 capsule (500 mg total) by mouth 4 (four) times daily. 28 capsule 0  . Clobetasol Propionate 0.05 % shampoo Apply to dry scalp and leave on for 15 minutes 118 mL 5  . clonazePAM (KLONOPIN) 1 MG tablet Take 1 tablet (1 mg total) by mouth 2 (two) times daily as needed for anxiety. 60 tablet 2  . dicyclomine (BENTYL) 20 MG tablet Take 1 tablet (20 mg total) by mouth every 6 (six) hours. 120 tablet 5  . gabapentin (NEURONTIN) 100 MG capsule TAKE ONE CAPSULE BY MOUTH 3 TIMES A DAY 90 capsule 5  . halobetasol (ULTRAVATE) 0.05 % cream Apply topically 2 (two) times daily. 50 g 0  . ibuprofen (ADVIL,MOTRIN) 200 MG tablet Take 400 mg by mouth every 6 (six) hours as needed (pain).    Marland Kitchen levothyroxine (SYNTHROID, LEVOTHROID) 88 MCG tablet TAKE 1 TABLET BY MOUTH DAILY BEFORE BREAKFAST. 30 tablet 4  . lisinopril-hydrochlorothiazide (PRINZIDE,ZESTORETIC) 20-25 MG tablet Take 1 tablet by mouth daily. 30 tablet 5  . metoprolol tartrate (LOPRESSOR) 25 MG tablet Take 1 tablet (25 mg total) by mouth 2 (two) times daily. 60 tablet 5  . Multiple Vitamin (MULTIVITAMIN WITH MINERALS) TABS tablet Take 1 tablet by mouth daily.    Marland Kitchen  omeprazole (PRILOSEC) 40 MG capsule Take 1 capsule (40 mg total) by mouth 2 (two) times daily. 60 capsule 6  . ondansetron (ZOFRAN) 4 MG tablet Take 1 tablet (4 mg total) by mouth every 8 (eight) hours as needed for nausea or vomiting. 20 tablet 0  . orphenadrine (NORFLEX) 100 MG tablet Take 1 tablet (100 mg total) by mouth 2 (two) times daily. 10 tablet 0  . oxyCODONE-acetaminophen (ROXICET) 5-325 MG tablet Take 1 tablet by mouth every 6 (six) hours as needed. 20 tablet 0  . promethazine (PHENERGAN) 12.5 MG tablet Take 1 tablet (12.5 mg total) by mouth every 8 (eight) hours as needed for nausea or vomiting. 20 tablet 0  . rosuvastatin (CRESTOR) 20 MG tablet Take 1 tablet (20 mg total) by mouth  daily. 90 tablet 2  . sertraline (ZOLOFT) 100 MG tablet Take 1.5 tablets (150 mg total) by mouth daily. 45 tablet 2  . methocarbamol (ROBAXIN) 500 MG tablet Take 1 tablet (500 mg total) by mouth every 8 (eight) hours as needed. 90 tablet 5  . traMADol (ULTRAM) 50 MG tablet TAKE 1 TABLET BY MOUTH EVERY 8 HOURS AS NEEDED 90 tablet 5  . ondansetron (ZOFRAN ODT) 4 MG disintegrating tablet Take 1 tablet (4 mg total) by mouth every 8 (eight) hours as needed for nausea or vomiting. (Patient not taking: Reported on 11/24/2016) 20 tablet 5  . sertraline (ZOLOFT) 100 MG tablet TAKE 1 TABLET BY MOUTH EVERY DAY (Patient not taking: Reported on 11/24/2016) 30 tablet 0   No facility-administered medications prior to visit.     Review of Systems;  Patient denies headache, fevers, malaise, unintentional weight loss, skin rash, eye pain, sinus congestion and sinus pain, sore throat, dysphagia,  hemoptysis , cough, dyspnea, wheezing, chest pain, palpitations, orthopnea, edema, abdominal pain, nausea, melena, diarrhea, constipation, flank pain, dysuria, hematuria, urinary  Frequency, nocturia, numbness, tingling, seizures,  Focal weakness, Loss of consciousness,  Tremor, insomnia, depression, anxiety, and suicidal ideation.      Objective:    BP Readings from Last 3 Encounters:  11/24/16 112/62  11/20/16 116/71  10/23/16 110/64    Wt Readings from Last 3 Encounters:  11/24/16 211 lb 12.8 oz (96.1 kg)  11/20/16 214 lb (97.1 kg)  10/23/16 219 lb 9.6 oz (99.6 kg)    General appearance: alert, cooperative and appears stated age Ears: normal TM's and external ear canals both ears Throat: lips, mucosa, and tongue normal; teeth and gums normal Neck: no adenopathy, no carotid bruit, supple, symmetrical, trachea midline and thyroid not enlarged, symmetric, no tenderness/mass/nodules Back: symmetric, no curvature. ROM normal. No CVA tenderness. Lungs: clear to auscultation bilaterally Heart: regular rate tender  to deep palpation in mid ipgastric area without guarding vo, bowel sounds normal; no masses,  no organomegaly Pulses: 2+ and symmetric Skin: Skin color, texture, turgor normal. No rashes or lesions Lymph nodes: Cervical, supraclavicular, and axillary nodes normal.  Lab Results  Component Value Date   HGBA1C 6.0 10/23/2016   HGBA1C 6.1 06/05/2016   HGBA1C 6.0 09/13/2015    Lab Results  Component Value Date   CREATININE 1.33 (H) 11/20/2016   CREATININE 1.01 10/23/2016   CREATININE 0.93 05/02/2016    Lab Results  Component Value Date   WBC 6.8 11/20/2016   HGB 14.1 11/20/2016   HCT 41.7 11/20/2016   PLT 266 11/20/2016   GLUCOSE 128 (H) 11/20/2016   CHOL 169 06/05/2016   TRIG 157.0 (H) 06/05/2016   HDL 47.00 06/05/2016  LDLDIRECT 110.0 06/05/2016   LDLCALC 91 06/05/2016   ALT 26 11/20/2016   AST 29 11/20/2016   NA 134 (L) 11/20/2016   K 4.3 11/20/2016   CL 103 11/20/2016   CREATININE 1.33 (H) 11/20/2016   BUN 30 (H) 11/20/2016   CO2 25 11/20/2016   TSH 1.72 06/05/2016   PSA 0.65 03/30/2015   INR 0.98 08/27/2015   HGBA1C 6.0 10/23/2016   MICROALBUR <0.7 09/13/2015    Dg Chest 2 View  Result Date: 11/20/2016 CLINICAL DATA:  Chest pain radiating to the right ribcage into the back. Symptoms began 30 minutes prior to presentation. History of hypertension, former smoker. EXAM: CHEST  2 VIEW COMPARISON:  Chest x-ray of June 11, 2014 FINDINGS: The lungs are adequately inflated. There is no focal infiltrate, pleural effusion, or pneumothorax. The heart and pulmonary vascularity are normal. The mediastinum is normal in width. The trachea is midline. The bony thorax exhibits no acute abnormality. IMPRESSION: There is no pneumonia, CHF, nor other acute cardiopulmonary abnormality. Electronically Signed   By: [name]  Martinique M.D.   On: 11/20/2016 15:10   Ct Abdomen Pelvis W Contrast  Result Date: 11/20/2016 CLINICAL DATA:  51 year old male with generalized abdominal pain and  nausea. EXAM: CT ABDOMEN AND PELVIS WITH CONTRAST TECHNIQUE: Multidetector CT imaging of the abdomen and pelvis was performed using the standard protocol following bolus administration of intravenous contrast. CONTRAST:  173mL ISOVUE-300 IOPAMIDOL (ISOVUE-300) INJECTION 61% COMPARISON:  Abdominal ultrasound dated 11/20/2016 and CT dated 05/02/2016 FINDINGS: Lower chest: The visualized lung bases are clear. No intra-abdominal free air or free fluid. Hepatobiliary: Mild diffuse fatty infiltration of the liver. No intrahepatic biliary ductal dilatation. The gallbladder is unremarkable. Pancreas: The pancreas is unremarkable. Mild hazy appearance of the fat adjacent to the uncinate process of the pancreas likely related to inflammatory changes of the second portion of the duodenum. Acute pancreatitis as the primary cause of the inflammatory process is less likely but not entirely excluded. Correlation with pancreatic enzymes recommended. There is no peripancreatic fluid collection or abscess. Spleen: Normal in size without focal abnormality. Adrenals/Urinary Tract: Adrenal glands are unremarkable. Kidneys are normal, without renal calculi, focal lesion, or hydronephrosis. Bladder is unremarkable. Stomach/Bowel: There is mild inflammatory changes of the second portion of the duodenum concerning for duodenitis. Clinical correlation is recommended. Small scattered colonic diverticula without active inflammatory changes noted. There is moderate stool throughout the colon. No evidence of bowel obstruction. There is fecalization of multiple loops of distal small bowel which may represent increased transit time or small intestinal bacterial overgrowth. Appendectomy. Vascular/Lymphatic: There is moderate aortoiliac atherosclerotic disease. The origins of the celiac axis, SMA, IMA and the renal arteries are patent. The SMV, splenic vein, and main portal vein are patent. No portal venous gas identified. There is no adenopathy.  Reproductive: The prostate and seminal vesicles are grossly unremarkable. No pelvic mass. Other: Small fat containing bilateral inguinal hernia. Musculoskeletal: No acute or significant osseous findings. IMPRESSION: 1. Mild inflammatory changes abutting the second portion of the duodenum most consistent with do the 90s. An early acute pancreatitis as the primary cause of the inflammation is less likely but not excluded. Correlation with pancreatic enzymes recommended. 2. Moderate colonic stool burden.  No bowel obstruction. 3. Fatty liver. 4.  Aortic Atherosclerosis (ICD10-I70.0). Electronically Signed   By: Anner Crete M.D.   On: 11/20/2016 18:48   US Abdomen Limited Ruq  Result Date: 11/20/2016 CLINICAL DATA:  Right upper quadrant pain EXAM: ULTRASOUND ABDOMEN LIMITED  RIGHT UPPER QUADRANT COMPARISON:  CT 05/02/2016.  Ultrasound 07/30/2012 FINDINGS: Gallbladder: No gallstones or wall thickening visualized. No sonographic Murphy sign noted by sonographer. Common bile duct: Diameter: 3 mm Liver: Liver is diffusely hyperechoic with an area of ill-defined hypoechogenicity adjacent to the gallbladder fossa likely an area of fatty sparing. This measures 2.6 x 1.5 cm IMPRESSION: Negative for gallstones Fatty infiltration liver with focal fatty sparing adjacent to the gallbladder. Electronically Signed   By: Franchot Gallo M.D.   On: 11/20/2016 16:30    Assessment & Plan:   Problem List Items Addressed This Visit    Pancreatitis, alcoholic, acute    Reviewed diagnosis with patient., which was made with CT abdomen, elevated lipase and U/S negative for gallstones and biliary duct dilation .   CT was done,   Strongly advised to abstain from all alcohol indefinitely given concurrent diagnosis of fatty liver and chronic pain /insomnia managed with tramadol.  Current post prandial pain noted,  Advised to return to clear liquid diet until pain resolves.       Relevant Medications   traMADol (ULTRAM) 50 MG  tablet   Fatty liver    Reviewed the inherent risk in continuining to use alcohol with diagnosis of fatty liver      Angioedema of lips    Patient has been taking lisinopril for 6 months with no reaction.  Attributed to Ambien since Upper lip swelling started after taking zolpidem for the first time.  Depo medrol, steroid taper, antihistamine blockade. If symptoms recur will need to stop lisinopril as well        Other Visit Diagnoses    Bleeding hemorrhoid    -  Primary   Relevant Orders   Ambulatory referral to General Surgery   Alcohol-induced chronic pancreatitis (HCC)       Relevant Medications   traMADol (ULTRAM) 50 MG tablet   Other Relevant Orders   Lipase (Completed)   Ethanol     A total of 40 minutes was spent with patient more than half of which was spent in counseling patient on the above mentioned issues , reviewing and explaining recent labs and imaging studies done, and coordination of care.   I have changed Mr. Edmundson's traMADol. I am also having him start on Pramoxine-HC, hydrocortisone, and predniSONE. Additionally, I am having him maintain his BAYER MICROLET LANCETS, multivitamin with minerals, ibuprofen, aspirin EC, buPROPion, Clobetasol Propionate, dicyclomine, gabapentin, lisinopril-hydrochlorothiazide, metoprolol tartrate, omeprazole, promethazine, rosuvastatin, orphenadrine, levothyroxine, halobetasol, clonazePAM, cephALEXin, sertraline, ondansetron, oxyCODONE-acetaminophen, zolpidem, and methocarbamol.  Meds ordered this encounter  Medications  . zolpidem (AMBIEN) 10 MG tablet    Sig: Ambien 10 mg tablet  1 po hs prn insomnia  . Pramoxine-HC 1-2 % GEL    Sig: Apply 1 application topically 2 (two) times daily.    Dispense:  29 g    Refill:  3  . hydrocortisone (ANUSOL-HC) 2.5 % rectal cream    Sig: Place 1 application rectally 2 (two) times daily.    Dispense:  30 g    Refill:  0  . traMADol (ULTRAM) 50 MG tablet    Sig: Take 1 tablet (50 mg total) by  mouth every 8 (eight) hours as needed.    Dispense:  90 tablet    Refill:  5    Not to exceed 5 additional fills before 12/02/2016.  . methocarbamol (ROBAXIN) 500 MG tablet    Sig: Take 1 tablet (500 mg total) by mouth every 8 (eight) hours as  needed.    Dispense:  90 tablet    Refill:  5  . predniSONE (DELTASONE) 10 MG tablet    Sig: 6 tablets on Day 1 , then reduce by 1 tablet daily until gone    Dispense:  21 tablet    Refill:  0    Medications Discontinued During This Encounter  Medication Reason  . ondansetron (ZOFRAN ODT) 4 MG disintegrating tablet Duplicate  . sertraline (ZOLOFT) 100 MG tablet Patient has not taken in last 30 days  . traMADol (ULTRAM) 50 MG tablet Reorder  . methocarbamol (ROBAXIN) 500 MG tablet Reorder    Follow-up: No Follow-up on file.   [name], MD

## 2016-11-24 NOTE — Telephone Encounter (Signed)
Patient sent in MyChart message stating he took [place] last night and woke up with swollen upper lip. No difficulty breathing or acute distress. Instructed patient to take 25 mg of benadryl and 20 mg of pepcid or zantac per doctors orders. Scheduled to see PCP at 11:30.

## 2016-11-25 ENCOUNTER — Encounter: Payer: Self-pay | Admitting: Internal Medicine

## 2016-11-26 DIAGNOSIS — T783XXA Angioneurotic edema, initial encounter: Secondary | ICD-10-CM | POA: Insufficient documentation

## 2016-11-26 DIAGNOSIS — K852 Alcohol induced acute pancreatitis without necrosis or infection: Secondary | ICD-10-CM | POA: Insufficient documentation

## 2016-11-26 NOTE — Assessment & Plan Note (Signed)
Reviewed the inherent risk in continuining to use alcohol with diagnosis of fatty liver

## 2016-11-26 NOTE — Assessment & Plan Note (Addendum)
Reviewed diagnosis with patient., which was made with CT abdomen, elevated lipase and U/S negative for gallstones and biliary duct dilation .   CT was done,   Strongly advised to abstain from all alcohol indefinitely given concurrent diagnosis of fatty liver and chronic pain /insomnia managed with tramadol.  Current post prandial pain noted,  Advised to return to clear liquid diet until pain resolves.

## 2016-11-26 NOTE — Assessment & Plan Note (Signed)
Patient has been taking lisinopril for 6 months with no reaction.  Attributed to Ambien since Upper lip swelling started after taking zolpidem for the first time.  Depo medrol, steroid taper, antihistamine blockade. If symptoms recur will need to stop lisinopril as well

## 2016-11-27 ENCOUNTER — Encounter: Payer: Self-pay | Admitting: General Surgery

## 2016-11-27 ENCOUNTER — Ambulatory Visit: Payer: BLUE CROSS/BLUE SHIELD | Admitting: Internal Medicine

## 2016-11-28 LAB — ETHANOL: ETHANOL LVL: NEGATIVE %

## 2016-11-28 NOTE — Telephone Encounter (Signed)
PA submitted on covermymeds and has been approved. Pharmacist and pt have both been notified.

## 2016-12-09 ENCOUNTER — Other Ambulatory Visit: Payer: Self-pay | Admitting: Internal Medicine

## 2016-12-12 ENCOUNTER — Ambulatory Visit (INDEPENDENT_AMBULATORY_CARE_PROVIDER_SITE_OTHER): Payer: BLUE CROSS/BLUE SHIELD | Admitting: General Surgery

## 2016-12-12 ENCOUNTER — Encounter: Payer: Self-pay | Admitting: General Surgery

## 2016-12-12 VITALS — BP 130/68 | HR 70 | Resp 14 | Ht 72.0 in | Wt 216.0 lb

## 2016-12-12 DIAGNOSIS — K644 Residual hemorrhoidal skin tags: Secondary | ICD-10-CM | POA: Diagnosis not present

## 2016-12-12 NOTE — Progress Notes (Signed)
Patient ID: [name], male   DOB: September 24, 1965, 51 y.o.   MRN: [id number]  Chief Complaint  Patient presents with  . Hemorrhoids    HPI [name] is a 51 y.o. male here today for a evaluation of bleeding hemorrhoids. Patient states  He has had some constipation three weeks ago. He states he noticed some bright red blood on the paper and some in the bowl. He states he has hemorrhoids for about three years now.  Previous bleeding episode 6 months earlier. Patient states he uses stool softeners.  HPI  Past Medical History:  Diagnosis Date  . Anxiety   . Anxiety and depression   . Bruit    L  . Chest pain    hx  . Diabetes mellitus without complication ([place])    "borderline"  . Edema   . Fatty liver   . GERD (gastroesophageal reflux disease)    uses Omeprazole  . Goiter   . HLD (hyperlipidemia)   . Hypertension    essential, benign  . Hypothyroidism   . Impotence of organic origin   . Murmur   . Other chest pain    tightness, pressure  . Palpitation    hx  . Precordial pain   . Sleep apnea    uses cpap    Past Surgical History:  Procedure Laterality Date  . APPENDECTOMY  2002   done at [facility]  . CARDIAC CATHETERIZATION    . CARDIAC CATHETERIZATION N/A 08/27/2015   Procedure: Left Heart Cath and Coronary Angiography;  Surgeon: Jolaine Artist, MD;  Location: [facility];  Service: Cardiovascular;  Laterality: N/A;  . COLONOSCOPY    . HERNIA REPAIR  6-38-75   umbilical  . JOINT REPLACEMENT     right knee  . REVISION TOTAL KNEE ARTHROPLASTY Right 06/23/2014   DR Marlou Sa  . TONSILLECTOMY    . TOTAL KNEE ARTHROPLASTY Right 2011   right  . TOTAL KNEE REVISION Right 06/23/2014   Procedure: TOTAL KNEE REVISION;  Surgeon: Meredith Pel, MD;  Location: [place] [facility];  Service: Orthopedics;  Laterality: Right;    Family History  Problem Relation Age of Onset  . Heart disease Father     Social History Social History  Substance Use Topics  . Smoking status:  Former Smoker    Packs/day: 2.00    Years: 18.00    Types: Cigarettes    Quit date: 04/24/2005  . Smokeless tobacco: Never Used     Comment: quit 20 years ago   . Alcohol use 10.2 oz/week    3 Shots of liquor, 14 Standard drinks or equivalent per week     Comment: 1 a day    Allergies  Allergen Reactions  . Zolpidem     Current Outpatient Prescriptions  Medication Sig Dispense Refill  . aspirin EC 81 MG tablet Take 81 mg by mouth daily.    Marland Kitchen BAYER MICROLET LANCETS lancets Use as instructed 100 each 12  . buPROPion (WELLBUTRIN SR) 150 MG 12 hr tablet Take 1 tablet (150 mg total) by mouth 2 (two) times daily. 60 tablet 5  . cephALEXin (KEFLEX) 500 MG capsule Take 1 capsule (500 mg total) by mouth 4 (four) times daily. 28 capsule 0  . Clobetasol Propionate 0.05 % shampoo Apply to dry scalp and leave on for 15 minutes 118 mL 5  . clonazePAM (KLONOPIN) 1 MG tablet Take 1 tablet (1 mg total) by mouth 2 (two) times daily as needed  for anxiety. 60 tablet 2  . dicyclomine (BENTYL) 20 MG tablet TAKE 1 TABLET (20 MG TOTAL) BY MOUTH EVERY 6 (SIX) HOURS. 120 tablet 5  . gabapentin (NEURONTIN) 100 MG capsule TAKE ONE CAPSULE BY MOUTH 3 TIMES A DAY 90 capsule 5  . halobetasol (ULTRAVATE) 0.05 % cream Apply topically 2 (two) times daily. 50 g 0  . hydrocortisone (ANUSOL-HC) 2.5 % rectal cream Place 1 application rectally 2 (two) times daily. 30 g 0  . ibuprofen (ADVIL,MOTRIN) 200 MG tablet Take 400 mg by mouth every 6 (six) hours as needed (pain).    Marland Kitchen levothyroxine (SYNTHROID, LEVOTHROID) 88 MCG tablet TAKE 1 TABLET BY MOUTH DAILY BEFORE BREAKFAST. 30 tablet 4  . lisinopril-hydrochlorothiazide (PRINZIDE,ZESTORETIC) 20-25 MG tablet Take 1 tablet by mouth daily. 30 tablet 5  . methocarbamol (ROBAXIN) 500 MG tablet Take 1 tablet (500 mg total) by mouth every 8 (eight) hours as needed. 90 tablet 5  . metoprolol tartrate (LOPRESSOR) 25 MG tablet Take 1 tablet (25 mg total) by mouth 2 (two) times daily. 60  tablet 5  . Multiple Vitamin (MULTIVITAMIN WITH MINERALS) TABS tablet Take 1 tablet by mouth daily.    Marland Kitchen omeprazole (PRILOSEC) 40 MG capsule Take 1 capsule (40 mg total) by mouth 2 (two) times daily. 60 capsule 6  . ondansetron (ZOFRAN) 4 MG tablet Take 1 tablet (4 mg total) by mouth every 8 (eight) hours as needed for nausea or vomiting. 20 tablet 0  . orphenadrine (NORFLEX) 100 MG tablet Take 1 tablet (100 mg total) by mouth 2 (two) times daily. 10 tablet 0  . oxyCODONE-acetaminophen (ROXICET) 5-325 MG tablet Take 1 tablet by mouth every 6 (six) hours as needed. 20 tablet 0  . Pramoxine-HC 1-2 % GEL Apply 1 application topically 2 (two) times daily. 29 g 3  . predniSONE (DELTASONE) 10 MG tablet 6 tablets on Day 1 , then reduce by 1 tablet daily until gone 21 tablet 0  . promethazine (PHENERGAN) 12.5 MG tablet Take 1 tablet (12.5 mg total) by mouth every 8 (eight) hours as needed for nausea or vomiting. 20 tablet 0  . rosuvastatin (CRESTOR) 20 MG tablet Take 1 tablet (20 mg total) by mouth daily. 90 tablet 2  . sertraline (ZOLOFT) 100 MG tablet Take 1.5 tablets (150 mg total) by mouth daily. 45 tablet 2  . traMADol (ULTRAM) 50 MG tablet Take 1 tablet (50 mg total) by mouth every 8 (eight) hours as needed. 90 tablet 5  . zolpidem (AMBIEN) 10 MG tablet Ambien 10 mg tablet  1 po hs prn insomnia     No current facility-administered medications for this visit.     Review of Systems Review of Systems  Constitutional: Negative.   Respiratory: Negative.   Cardiovascular: Negative.   Gastrointestinal: Positive for anal bleeding.    Blood pressure 130/68, pulse 70, resp. rate 14, height 6' (1.829 m), weight 216 lb (98 kg).  Physical Exam Physical Exam  Constitutional: He is oriented to person, place, and time. He appears well-developed and well-nourished.  Eyes: Conjunctivae are normal. No scleral icterus.  Neck: Neck supple.  Cardiovascular: Normal rate, regular rhythm and normal heart  sounds.   Pulmonary/Chest: Effort normal and breath sounds normal.  Abdominal: Soft. Normal appearance and bowel sounds are normal. There is no tenderness.  Genitourinary: Rectum normal and prostate normal.  Lymphadenopathy:    He has no cervical adenopathy.  Neurological: He is alert and oriented to person, place, and time.  Skin: Skin  is warm and dry.    Data Reviewed CBC dated 11/20/2016 showed a hemoglobin of 14.1 with an MCV of 95, white blood cell count 6800.  Assessment    History of rectal bleeding, candidate for screening colonoscopy.    Plan          Colonoscopy with possible biopsy/polypectomy prn: Information regarding the procedure, including its potential risks and complications (including but not limited to perforation of the bowel, which may require emergency surgery to repair, and bleeding) was verbally given to the patient. Educational information regarding lower intestinal endoscopy was given to the patient. Written instructions for how to complete the bowel prep using Miralax were provided. The importance of drinking ample fluids to avoid dehydration as a result of the prep emphasized.  HPI, Physical Exam, Assessment and Plan have been scribed under the direction and in the presence of Hervey Ard, MD.  Gaspar Cola, CMA  I have completed the exam and reviewed the above documentation for accuracy and completeness.  I agree with the above.  Haematologist has been used and any errors in dictation or transcription are unintentional.  Hervey Ard, M.D., F.A.C.S.  Robert Bellow 12/15/2016, 5:44 PM

## 2016-12-12 NOTE — Patient Instructions (Signed)
Colonoscopy, Adult A colonoscopy is an exam to look at the entire large intestine. During the exam, a lubricated, bendable tube is inserted into the anus and then passed into the rectum, colon, and other parts of the large intestine. A colonoscopy is often done as a part of normal colorectal screening or in response to certain symptoms, such as anemia, persistent diarrhea, abdominal pain, and blood in the stool. The exam can help screen for and diagnose medical problems, including:  Tumors.  Polyps.  Inflammation.  Areas of bleeding.  Tell a health care provider about:  Any allergies you have.  All medicines you are taking, including vitamins, herbs, eye drops, creams, and over-the-counter medicines.  Any problems you or family members have had with anesthetic medicines.  Any blood disorders you have.  Any surgeries you have had.  Any medical conditions you have.  Any problems you have had passing stool. What are the risks? Generally, this is a safe procedure. However, problems may occur, including:  Bleeding.  A tear in the intestine.  A reaction to medicines given during the exam.  Infection (rare).  What happens before the procedure? Eating and drinking restrictions Follow instructions from your health care provider about eating and drinking, which may include:  A few days before the procedure - follow a low-fiber diet. Avoid nuts, seeds, dried fruit, raw fruits, and vegetables.  1-3 days before the procedure - follow a clear liquid diet. Drink only clear liquids, such as clear broth or bouillon, black coffee or tea, clear juice, clear soft drinks or sports drinks, gelatin dessert, and popsicles. Avoid any liquids that contain red or purple dye.  On the day of the procedure - do not eat or drink anything during the 2 hours before the procedure, or within the time period that your health care provider recommends.  Bowel prep If you were prescribed an oral bowel prep  to clean out your colon:  Take it as told by your health care provider. Starting the day before your procedure, you will need to drink a large amount of medicated liquid. The liquid will cause you to have multiple loose stools until your stool is almost clear or light green.  If your skin or anus gets irritated from diarrhea, you may use these to relieve the irritation: ? Medicated wipes, such as adult wet wipes with aloe and vitamin E. ? A skin soothing-product like petroleum jelly.  If you vomit while drinking the bowel prep, take a break for up to 60 minutes and then begin the bowel prep again. If vomiting continues and you cannot take the bowel prep without vomiting, call your health care provider.  General instructions  Ask your health care provider about changing or stopping your regular medicines. This is especially important if you are taking diabetes medicines or blood thinners.  Plan to have someone take you home from the hospital or clinic. What happens during the procedure?  An IV tube may be inserted into one of your veins.  You will be given medicine to help you relax (sedative).  To reduce your risk of infection: ? Your health care team will wash or sanitize their hands. ? Your anal area will be washed with soap.  You will be asked to lie on your side with your knees bent.  Your health care provider will lubricate a long, thin, flexible tube. The tube will have a camera and a light on the end.  The tube will be inserted into your   anus.  The tube will be gently eased through your rectum and colon.  Air will be delivered into your colon to keep it open. You may feel some pressure or cramping.  The camera will be used to take images during the procedure.  A small tissue sample may be removed from your body to be examined under a microscope (biopsy). If any potential problems are found, the tissue will be sent to a lab for testing.  If small polyps are found, your  health care provider may remove them and have them checked for cancer cells.  The tube that was inserted into your anus will be slowly removed. The procedure may vary among health care providers and hospitals. What happens after the procedure?  Your blood pressure, heart rate, breathing rate, and blood oxygen level will be monitored until the medicines you were given have worn off.  Do not drive for 24 hours after the exam.  You may have a small amount of blood in your stool.  You may pass gas and have mild abdominal cramping or bloating due to the air that was used to inflate your colon during the exam.  It is up to you to get the results of your procedure. Ask your health care provider, or the department performing the procedure, when your results will be ready. This information is not intended to replace advice given to you by your health care provider. Make sure you discuss any questions you have with your health care provider. Document Released: 04/07/2000 Document Revised: 02/09/2016 Document Reviewed: 06/22/2015 Elsevier Interactive Patient Education  2018 [employer]  

## 2016-12-19 ENCOUNTER — Other Ambulatory Visit: Payer: Self-pay | Admitting: Internal Medicine

## 2016-12-21 ENCOUNTER — Encounter (INDEPENDENT_AMBULATORY_CARE_PROVIDER_SITE_OTHER): Payer: Self-pay | Admitting: Orthopedic Surgery

## 2016-12-21 ENCOUNTER — Ambulatory Visit (INDEPENDENT_AMBULATORY_CARE_PROVIDER_SITE_OTHER): Payer: BLUE CROSS/BLUE SHIELD | Admitting: Orthopedic Surgery

## 2016-12-21 DIAGNOSIS — M75121 Complete rotator cuff tear or rupture of right shoulder, not specified as traumatic: Secondary | ICD-10-CM

## 2016-12-21 NOTE — Progress Notes (Signed)
Office Visit Note   Patient: [name]           Date of Birth: Feb 21, 1966           MRN: [id number] Visit Date: 12/21/2016 Requested by: Joshua Mc, MD [name] [place] [name], [name] [name] PCP: Joshua Mc, MD  Subjective: Chief Complaint  Patient presents with  . Right Shoulder - Pain    HPI: [name] is a 51 year old patient who injured his right shoulder at work in October 2017.  He had surgery in January 2018 at [facility] or toward the.  Into physical therapy.  He is still having a lot of pain.  The pain wakes him from sleep at night.  Describes constant pain.  He is still out on workman's comp.  He is actually RA settles his case with workman's comp.  He has likely lost his job according to [name].  He's had previous left shoulder rotator cuff repair done with good result.              ROS: All systems reviewed are negative as they relate to the chief complaint within the history of present illness.  Patient denies  fevers or chills.   Assessment & Plan: Visit Diagnoses: No diagnosis found.  Plan: Impression is recurrent rotator cuff tear right shoulder following arthroscopic rotator cuff repair done at [facility] or [facility].  MRI scan is reviewed.  He does have recurrent rotator cuff tearing.  He's gotten to do well with this long-term particularly in his type of physical work.  Plan is arthroscopy and cuff evaluation with likely mini open repair.  Risks and benefits are discussed including not limited to infection or vessel damage incomplete healing incomplete restoration of function as well as potential failure of the repair.  Patient understands risks and benefits and wishes to proceed.  All questions answered.  Anticipate out of work time on the order of 3 months minimum following the surgery.  He will also need a CPM machine or abduction brace following surgery.  Follow-Up Instructions: No Follow-up on file.   Orders:  No orders of the defined types were  placed in this encounter.  No orders of the defined types were placed in this encounter.     Procedures: No procedures performed   Clinical Data: No additional findings.  Objective: Vital Signs: There were no vitals taken for this visit.  Physical Exam:   Constitutional: Patient appears well-developed HEENT:  Head: Normocephalic Eyes:EOM are normal Neck: Normal range of motion Cardiovascular: Normal rate Pulmonary/chest: Effort normal Neurologic: Patient is alert Skin: Skin is warm Psychiatric: Patient has normal mood and affect    Ortho Exam: Orthopedic exam demonstrates full active and passive range of motion of the left shoulder.  The right shoulder has pain with abduction and forward flexion course grinding and crepitus with internal/external rotation.  Well-healed surgical incision from biceps tenodesis.  Arthroscopic portals are all well-healed.  No acromioclavicular joint tenderness to direct palpation.  Specialty Comments:  No specialty comments available.  Imaging: No results found.   PMFS History: Patient Active Problem List   Diagnosis Date Noted  . Pancreatitis, alcoholic, acute 09/73/5329  . Angioedema of lips 11/26/2016  . Cognitive complaints with normal neuropsychological exam 10/23/2016  . 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 Hypothyroidism due to acquired atrophy of thyroid 09/14/2015  . Arthritis of knee  06/23/2014  . Enlarged RV (right ventricle) 06/17/2014  . Cervical spine degeneration 02/17/2014  . Overweight 10/29/2013  . Chronic knee pain 10/29/2013  . Diabetes mellitus type 2 in obese ([place]) 10/23/2012  . Seborrheic dermatitis, unspecified 05/17/2012  . Fatty liver 04/22/2012  . Polyarthralgia 12/24/2010  . Sciatica of right side 12/24/2010  . Hyperlipidemia associated with type 2 diabetes mellitus ([place])  01/13/2009  . Anxiety state 01/13/2009  . Essential hypertension 01/13/2009  . OSA (obstructive sleep apnea) 01/13/2009   Past Medical History:  Diagnosis Date  . Anxiety   . Anxiety and depression   . Bruit    L  . Chest pain    hx  . Diabetes mellitus without complication ([place])    "borderline"  . Edema   . Fatty liver   . GERD (gastroesophageal reflux disease)    uses Omeprazole  . Goiter   . HLD (hyperlipidemia)   . Hypertension    essential, benign  . Hypothyroidism   . Impotence of organic origin   . Murmur   . Other chest pain    tightness, pressure  . Palpitation    hx  . Precordial pain   . Sleep apnea    uses cpap    Family History  Problem Relation Age of Onset  . Heart disease Father     Past Surgical History:  Procedure Laterality Date  . APPENDECTOMY  2002   done at [facility]  . CARDIAC CATHETERIZATION    . CARDIAC CATHETERIZATION N/A 08/27/2015   Procedure: Left Heart Cath and Coronary Angiography;  Surgeon: Jolaine Artist, MD;  Location: [place] [facility];  Service: Cardiovascular;  Laterality: N/A;  . COLONOSCOPY    . HERNIA REPAIR  1-44-81   umbilical  . JOINT REPLACEMENT     right knee  . REVISION TOTAL KNEE ARTHROPLASTY Right 06/23/2014   DR Marlou Sa  . TONSILLECTOMY    . TOTAL KNEE ARTHROPLASTY Right 2011   right  . TOTAL KNEE REVISION Right 06/23/2014   Procedure: TOTAL KNEE REVISION;  Surgeon: Meredith Pel, MD;  Location: [facility];  Service: Orthopedics;  Laterality: Right;   Social History   Occupational History  . Maintenance Lexicographer  . SERVICE DIRECTOR [employer]   Social History Main Topics  . Smoking status: Former Smoker    Packs/day: 2.00    Years: 18.00    Types: Cigarettes    Quit date: 04/24/2005  . Smokeless tobacco: Never Used     Comment: quit 20 years ago   . Alcohol use 10.2 oz/week    3 Shots of liquor, 14 Standard drinks or equivalent per week     Comment: 1 a day  . Drug  use: No  . Sexual activity: Yes

## 2016-12-27 ENCOUNTER — Encounter: Payer: Self-pay | Admitting: Internal Medicine

## 2016-12-28 ENCOUNTER — Encounter: Payer: Self-pay | Admitting: Internal Medicine

## 2016-12-29 ENCOUNTER — Other Ambulatory Visit: Payer: Self-pay | Admitting: Internal Medicine

## 2016-12-29 DIAGNOSIS — R6889 Other general symptoms and signs: Secondary | ICD-10-CM

## 2016-12-29 DIAGNOSIS — R5383 Other fatigue: Secondary | ICD-10-CM

## 2016-12-29 NOTE — Progress Notes (Unsigned)
th

## 2017-01-04 ENCOUNTER — Other Ambulatory Visit (INDEPENDENT_AMBULATORY_CARE_PROVIDER_SITE_OTHER): Payer: BLUE CROSS/BLUE SHIELD

## 2017-01-04 DIAGNOSIS — R5383 Other fatigue: Secondary | ICD-10-CM | POA: Diagnosis not present

## 2017-01-04 LAB — COMPREHENSIVE METABOLIC PANEL
ALBUMIN: 4.2 g/dL (ref 3.5–5.2)
ALK PHOS: 93 U/L (ref 39–117)
ALT: 24 U/L (ref 0–53)
AST: 24 U/L (ref 0–37)
BUN: 17 mg/dL (ref 6–23)
CALCIUM: 9.3 mg/dL (ref 8.4–10.5)
CO2: 31 mEq/L (ref 19–32)
Chloride: 103 mEq/L (ref 96–112)
Creatinine, Ser: 0.93 mg/dL (ref 0.40–1.50)
GFR: 90.63 mL/min (ref >60.00)
Glucose, Bld: 121 mg/dL — ABNORMAL HIGH (ref 70–99)
POTASSIUM: 4.4 meq/L (ref 3.5–5.1)
SODIUM: 141 meq/L (ref 135–145)
TOTAL PROTEIN: 6.6 g/dL (ref 6.0–8.3)
Total Bilirubin: 0.4 mg/dL (ref 0.2–1.2)

## 2017-01-04 LAB — T4, FREE: Free T4: 0.78 ng/dL (ref 0.60–1.60)

## 2017-01-04 LAB — TSH: TSH: 1.98 u[IU]/mL (ref 0.35–4.50)

## 2017-01-08 LAB — TESTOS,TOTAL,FREE AND SHBG (FEMALE)
FREE TESTOSTERONE: 51.4 pg/mL (ref 35.0–155.0)
SEX HORMONE BINDING: 16 nmol/L (ref 10–50)
TESTOSTERONE, TOTAL, LC-MS-MS: 345 ng/dL (ref 250–1100)

## 2017-01-10 ENCOUNTER — Encounter: Payer: Self-pay | Admitting: Internal Medicine

## 2017-01-11 ENCOUNTER — Other Ambulatory Visit: Payer: Self-pay | Admitting: Internal Medicine

## 2017-02-01 ENCOUNTER — Other Ambulatory Visit: Payer: Self-pay | Admitting: Internal Medicine

## 2017-02-04 ENCOUNTER — Other Ambulatory Visit: Payer: Self-pay | Admitting: Internal Medicine

## 2017-02-04 DIAGNOSIS — F411 Generalized anxiety disorder: Secondary | ICD-10-CM

## 2017-02-07 ENCOUNTER — Other Ambulatory Visit: Payer: Self-pay | Admitting: Internal Medicine

## 2017-02-08 ENCOUNTER — Other Ambulatory Visit: Payer: Self-pay | Admitting: Internal Medicine

## 2017-02-08 NOTE — Telephone Encounter (Signed)
CLONAZEPAM REFILLED FOR ONE MONTH .DOSE REDUCED TO 0.5 MG TWICE DAILY DUE TO CONCURRENT USE OF OXYCODONE   HAS TO BE SEEN FOR ADDITIONAL REFILLS

## 2017-02-08 NOTE — Telephone Encounter (Signed)
Last OV 11/24/2016 Next OV not scheduled Last refill 09/21/2016

## 2017-02-09 ENCOUNTER — Encounter: Payer: Self-pay | Admitting: Emergency Medicine

## 2017-02-09 ENCOUNTER — Telehealth: Payer: Self-pay

## 2017-02-09 ENCOUNTER — Emergency Department: Payer: BLUE CROSS/BLUE SHIELD

## 2017-02-09 ENCOUNTER — Emergency Department
Admission: EM | Admit: 2017-02-09 | Discharge: 2017-02-09 | Disposition: A | Discharge status: Home / Self Care | Payer: BLUE CROSS/BLUE SHIELD | Attending: Student in an Organized Health Care Education/Training Program | Admitting: Student in an Organized Health Care Education/Training Program

## 2017-02-09 DIAGNOSIS — E119 Type 2 diabetes mellitus without complications: Secondary | ICD-10-CM | POA: Diagnosis not present

## 2017-02-09 DIAGNOSIS — I10 Essential (primary) hypertension: Secondary | ICD-10-CM | POA: Insufficient documentation

## 2017-02-09 DIAGNOSIS — F13939 Sedative, hypnotic or anxiolytic use, unspecified with withdrawal, unspecified: Secondary | ICD-10-CM

## 2017-02-09 DIAGNOSIS — F418 Other specified anxiety disorders: Secondary | ICD-10-CM | POA: Insufficient documentation

## 2017-02-09 DIAGNOSIS — E039 Hypothyroidism, unspecified: Secondary | ICD-10-CM | POA: Insufficient documentation

## 2017-02-09 DIAGNOSIS — F13239 Sedative, hypnotic or anxiolytic dependence with withdrawal, unspecified: Secondary | ICD-10-CM | POA: Insufficient documentation

## 2017-02-09 DIAGNOSIS — Z7982 Long term (current) use of aspirin: Secondary | ICD-10-CM | POA: Insufficient documentation

## 2017-02-09 DIAGNOSIS — Z96651 Presence of right artificial knee joint: Secondary | ICD-10-CM | POA: Diagnosis not present

## 2017-02-09 DIAGNOSIS — Z87891 Personal history of nicotine dependence: Secondary | ICD-10-CM | POA: Insufficient documentation

## 2017-02-09 LAB — URINALYSIS, ROUTINE W REFLEX MICROSCOPIC
BILIRUBIN URINE: NEGATIVE
Glucose, UA: NEGATIVE mg/dL
Hgb urine dipstick: NEGATIVE
Ketones, ur: NEGATIVE mg/dL
Leukocytes, UA: NEGATIVE
NITRITE: NEGATIVE
PH: 6 (ref 5.0–8.0)
Protein, ur: NEGATIVE mg/dL
SPECIFIC GRAVITY, URINE: 1.008 (ref 1.005–1.030)

## 2017-02-09 LAB — COMPREHENSIVE METABOLIC PANEL
ALBUMIN: 4.1 g/dL (ref 3.5–5.0)
ALT: 26 U/L (ref 17–63)
ANION GAP: 10 (ref 5–15)
AST: 29 U/L (ref 15–41)
Alkaline Phosphatase: 82 U/L (ref 38–126)
BUN: 18 mg/dL (ref 6–20)
CHLORIDE: 101 mmol/L (ref 101–111)
CO2: 29 mmol/L (ref 22–32)
Calcium: 9.1 mg/dL (ref 8.9–10.3)
Creatinine, Ser: 0.92 mg/dL (ref 0.61–1.24)
GFR calc Af Amer: >60 mL/min (ref >60)
GLUCOSE: 104 mg/dL — AB (ref 65–99)
POTASSIUM: 3.9 mmol/L (ref 3.5–5.1)
Sodium: 140 mmol/L (ref 135–145)
TOTAL PROTEIN: 7 g/dL (ref 6.5–8.1)
Total Bilirubin: 0.6 mg/dL (ref 0.3–1.2)

## 2017-02-09 LAB — CBC WITH DIFFERENTIAL/PLATELET
Basophils Absolute: 0 10*3/uL (ref 0–0.1)
Basophils Relative: 1 %
Eosinophils Absolute: 0.2 10*3/uL (ref 0–0.7)
Eosinophils Relative: 4 %
HEMATOCRIT: 41.7 % (ref 40.0–52.0)
HEMOGLOBIN: 14.3 g/dL (ref 13.0–18.0)
LYMPHS ABS: 0.7 10*3/uL — AB (ref 1.0–3.6)
LYMPHS PCT: 14 %
MCH: 31.9 pg (ref 26.0–34.0)
MCHC: 34.3 g/dL (ref 32.0–36.0)
MCV: 93.2 fL (ref 80.0–100.0)
MONOS PCT: 10 %
Monocytes Absolute: 0.5 10*3/uL (ref 0.2–1.0)
NEUTROS ABS: 3.7 10*3/uL (ref 1.4–6.5)
NEUTROS PCT: 71 %
Platelets: 226 10*3/uL (ref 150–440)
RBC: 4.47 MIL/uL (ref 4.40–5.90)
RDW: 13 % (ref 11.5–14.5)
WBC: 5.1 10*3/uL (ref 3.8–10.6)

## 2017-02-09 LAB — URINE DRUG SCREEN, QUALITATIVE (ARMC ONLY)
AMPHETAMINES, UR SCREEN: NOT DETECTED
Barbiturates, Ur Screen: NOT DETECTED
Benzodiazepine, Ur Scrn: NOT DETECTED
CANNABINOID 50 NG, UR ~~LOC~~: NOT DETECTED
COCAINE METABOLITE, UR ~~LOC~~: NOT DETECTED
MDMA (ECSTASY) UR SCREEN: NOT DETECTED
Methadone Scn, Ur: NOT DETECTED
Opiate, Ur Screen: NOT DETECTED
Phencyclidine (PCP) Ur S: NOT DETECTED
TRICYCLIC, UR SCREEN: NOT DETECTED

## 2017-02-09 LAB — ETHANOL

## 2017-02-09 MED ORDER — LORAZEPAM 1 MG PO TABS
1.0000 mg | ORAL_TABLET | Freq: Once | ORAL | Status: AC
  Administered 2017-02-09: 1 mg via ORAL
  Filled 2017-02-09: qty 1

## 2017-02-09 MED ORDER — CHLORDIAZEPOXIDE HCL 25 MG PO CAPS
25.0000 mg | ORAL_CAPSULE | Freq: Once | ORAL | Status: AC
  Administered 2017-02-09: 25 mg via ORAL
  Filled 2017-02-09: qty 1

## 2017-02-09 MED ORDER — LORAZEPAM 2 MG/ML IJ SOLN
1.0000 mg | Freq: Once | INTRAMUSCULAR | Status: AC
  Administered 2017-02-09: 1 mg via INTRAVENOUS
  Filled 2017-02-09: qty 1

## 2017-02-09 NOTE — ED Notes (Signed)
Patient transported to MRI 

## 2017-02-09 NOTE — ED Notes (Signed)
Pt requested to use restroom outside of room. Pt ambulated to restroom in hallway without difficulty with steady gait.

## 2017-02-09 NOTE — ED Notes (Signed)
MRI screening patient on phone at this time 

## 2017-02-09 NOTE — Telephone Encounter (Signed)
Patient came in the office to pick up his Rx klonopin,  Normally takes in morning last took on Sunday, called Pharmacy for refill and refill was completed here yesterday. He tried to call the office last night and got the after hours service and they advised to go to a walk-in clinic and he declined.   Patient is very jittery and speech is jumbled. Hard to put words together, noticed this was really bad yesterday.  Yesterday had headache, nauseated. He worked yesterday and his manager told him that he should go get seen.  BP 158/96 in left arm , HR 67, oxygen saturation is 97%.  No Pain, no numbness, no chest pain noted. Took med's this morning  Except for the klonopin since he didn't have it.  BS 190, ate breakfast this am already.     Vitals repeated. 158/90 BP. Seems to have settled down.  Per the patient he is not on oxycodone any more, since July for his surgery.   Spoke with the PCP, reviewed symptoms.  Per PCP [place] database showed refill of oxycodone form ED that was filled in August.  Advised that he really needs ED evaluation as advised last night.    [place] office team lead got wife on the phone she is on her way to the office.  After speaking with the patients wife, she would like him taken via ambulance.   Called 911 and awaiting Ambulance.

## 2017-02-09 NOTE — ED Triage Notes (Signed)
Pt arrived via EMS for reports of possible medication withdrawal symptoms from PCP office. Pt reports has been out of klonopin since Sunday this week. Pt reports yesterday he was at work and began to feel anxious, nauseated, had a headache and his legs felt numb. Pt reports yesterday his speech became garbled as well. Pt reports took a coworker's klonopin and his symptoms got some better and his speech cleared. Pt reports was at PCP today for refill. Pt speech garbled on arrival but became clearer during the triage process.EMS reports 176/96, HR 66, 97% RA.

## 2017-02-09 NOTE — Telephone Encounter (Signed)
Patient is withdrawing from clonazepam .  I cannot treat him here,  He needs to be treated in ER to prevent seizures.  He will need to schedule a follow up appt to discuss weaning from this medication

## 2017-02-09 NOTE — ED Provider Notes (Signed)
[facility] Emergency Department Provider Note    First MD Initiated Contact with Patient 02/09/17 [phone]     (approximate)  I have reviewed the triage vital signs and the nursing notes.   HISTORY  Chief Complaint Medication Withdrawal    HPI [name] is a 51 y.o. male with a history of anxiety and depression on Klonopin twice daily for the past 6 months presents with jitteriness, confusion, difficulty completing sentences and sense of nervousness started yesterday.  Patient's last dose of Klonopin was on Sunday.  Was trying to get it refilled at [facility] but it did not get approved until this morning.  Due to worsening symptoms the patient called his primary care doctor who told him to come to the ER for further evaluation.  Patient does appear agitated and hypervigilant.  Denies any numbness or tingling at this time.  Does have a mild headache.  Past Medical History:  Diagnosis Date  . Anxiety   . Anxiety and depression   . Bruit    L  . Chest pain    hx  . Diabetes mellitus without complication ([place])    "borderline"  . Edema   . Fatty liver   . GERD (gastroesophageal reflux disease)    uses Omeprazole  . Goiter   . HLD (hyperlipidemia)   . Hypertension    essential, benign  . Hypothyroidism   . Impotence of organic origin   . Murmur   . Other chest pain    tightness, pressure  . Palpitation    hx  . Precordial pain   . Sleep apnea    uses cpap   Family History  Problem Relation Age of Onset  . Heart disease Father    Past Surgical History:  Procedure Laterality Date  . APPENDECTOMY  2002   done at [facility]  . CARDIAC CATHETERIZATION    . CARDIAC CATHETERIZATION N/A 08/27/2015   Procedure: Left Heart Cath and Coronary Angiography;  Surgeon: Jolaine Artist, MD;  Location: [place] [facility];  Service: Cardiovascular;  Laterality: N/A;  . COLONOSCOPY    . HERNIA REPAIR  0-34-74   umbilical  . JOINT REPLACEMENT     right knee  .  REVISION TOTAL KNEE ARTHROPLASTY Right 06/23/2014   DR Marlou Sa  . TONSILLECTOMY    . TOTAL KNEE ARTHROPLASTY Right 2011   right  . TOTAL KNEE REVISION Right 06/23/2014   Procedure: TOTAL KNEE REVISION;  Surgeon: Meredith Pel, MD;  Location: [place];  Service: Orthopedics;  Laterality: Right;   Patient Active Problem List   Diagnosis Date Noted  . Pancreatitis, alcoholic, acute 25/95/6387  . Angioedema of lips 11/26/2016  . Cognitive complaints with normal neuropsychological exam 10/23/2016  . 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 Hypothyroidism due to acquired atrophy of thyroid 09/14/2015  . Arthritis of knee 06/23/2014  . Enlarged RV (right ventricle) 06/17/2014  . Cervical spine degeneration 02/17/2014  . Overweight 10/29/2013  . Chronic knee pain 10/29/2013  . Diabetes mellitus type 2 in obese ([place]) 10/23/2012  . Seborrheic dermatitis, unspecified 05/17/2012  . Fatty liver 04/22/2012  . Polyarthralgia 12/24/2010  . Sciatica of right side 12/24/2010  . Hyperlipidemia associated with type 2 diabetes mellitus ([place]) 01/13/2009  . Anxiety state 01/13/2009  . Essential hypertension 01/13/2009  . OSA (obstructive sleep apnea) 01/13/2009      Prior  to Admission medications   Medication Sig Start Date End Date Taking? Authorizing Provider  aspirin EC 81 MG tablet Take 81 mg by mouth daily.    [provider]  BAYER MICROLET LANCETS lancets Use as instructed 05/30/13   [name], MD  buPROPion [facility] [address] SR) 150 MG 12 hr tablet Take 1 tablet (150 mg total) by mouth 2 (two) times daily. 06/05/16   [name], MD  cephALEXin (KEFLEX) 500 MG capsule Take 1 capsule (500 mg total) by mouth 4 (four) times daily. 10/23/16   [name], MD  Clobetasol Propionate 0.05 % shampoo Apply to dry scalp and leave on for 15 minutes 06/05/16   [name],  MD  clonazePAM (KLONOPIN) 0.5 MG tablet Take 1 tablet (0.5 mg total) by mouth 2 (two) times daily as needed for anxiety. 02/08/17   [name], MD  clonazePAM (KLONOPIN) 1 MG tablet TAKE 1 TABLET BY MOUTH TWICE A DAY AS NEEDED FOR ANXIETY 02/09/17   [name], MD  dicyclomine (BENTYL) 20 MG tablet TAKE 1 TABLET (20 MG TOTAL) BY MOUTH EVERY 6 (SIX) HOURS. 12/11/16   [name], MD  gabapentin (NEURONTIN) 100 MG capsule TAKE ONE CAPSULE BY MOUTH 3 TIMES A DAY 12/19/16   [name], MD  halobetasol (ULTRAVATE) 0.05 % cream Apply topically 2 (two) times daily. 09/08/16   [name], MD  hydrocortisone (ANUSOL-HC) 2.5 % rectal cream Place 1 application rectally 2 (two) times daily. 11/24/16   [name], MD  ibuprofen (ADVIL,MOTRIN) 200 MG tablet Take 400 mg by mouth every 6 (six) hours as needed (pain).    [provider]  levothyroxine (SYNTHROID, LEVOTHROID) 88 MCG tablet TAKE 1 TABLET BY MOUTH DAILY BEFORE BREAKFAST. 12/11/16   [name], MD  lisinopril-hydrochlorothiazide (PRINZIDE,ZESTORETIC) 20-25 MG tablet TAKE 1 TABLET BY MOUTH DAILY. 02/01/17   [name], MD  methocarbamol (ROBAXIN) 500 MG tablet Take 1 tablet (500 mg total) by mouth every 8 (eight) hours as needed. 11/24/16   [name], MD  metoprolol tartrate (LOPRESSOR) 25 MG tablet TAKE 1 TABLET (25 MG TOTAL) BY MOUTH 2 (TWO) TIMES DAILY. 02/01/17   [name], MD  Multiple Vitamin (MULTIVITAMIN WITH MINERALS) TABS tablet Take 1 tablet by mouth daily.    [provider]  omeprazole (PRILOSEC) 40 MG capsule Take 1 capsule (40 mg total) by mouth 2 (two) times daily. 06/05/16   [name], MD  ondansetron (ZOFRAN) 4 MG tablet Take 1 tablet (4 mg total) by mouth every 8 (eight) hours as needed for nausea or vomiting. 11/20/16   Alfred Levins, [place], MD  orphenadrine (NORFLEX) 100 MG tablet Take 1 tablet (100 mg total) by mouth 2 (two) times daily. 06/10/16   Sable Feil, PA-C    oxyCODONE-acetaminophen (ROXICET) 5-325 MG tablet Take 1 tablet by mouth every 6 (six) hours as needed. 11/20/16 11/20/17  Rudene Re, MD  Pramoxine-HC 1-2 % GEL Apply 1 application topically 2 (two) times daily. 11/24/16   [name], MD  predniSONE (DELTASONE) 10 MG tablet 6 tablets on Day 1 , then reduce by 1 tablet daily until gone 11/24/16   [name], MD  promethazine (PHENERGAN) 12.5 MG tablet Take 1 tablet (12.5 mg total) by mouth every 8 (eight) hours as needed for nausea or vomiting. 06/05/16   [name], MD  rosuvastatin (CRESTOR) 20 MG tablet Take 1 tablet (20 mg total) by  mouth daily. 06/05/16   [name], MD  sertraline (ZOLOFT) 100 MG tablet TAKE 1.5 TABLETS (150 MG TOTAL) BY MOUTH DAILY. 02/05/17   [name], MD  traMADol (ULTRAM) 50 MG tablet Take 1 tablet (50 mg total) by mouth every 8 (eight) hours as needed. 11/24/16   [name], MD  zolpidem (AMBIEN) 10 MG tablet Ambien 10 mg tablet  1 po hs prn insomnia    [provider]    Allergies Zolpidem    Social History Social History  Substance Use Topics  . Smoking status: Former Smoker    Packs/day: 2.00    Years: 18.00    Types: Cigarettes    Quit date: 04/24/2005  . Smokeless tobacco: Never Used     Comment: quit 20 years ago   . Alcohol use 10.2 oz/week    3 Shots of liquor, 14 Standard drinks or equivalent per week     Comment: 1 a day    Review of Systems Patient denies headaches, rhinorrhea, blurry vision, numbness, shortness of breath, chest pain, edema, cough, abdominal pain, nausea, vomiting, diarrhea, dysuria, fevers, rashes or hallucinations unless otherwise stated above in HPI. ____________________________________________   PHYSICAL EXAM:  VITAL SIGNS: Vitals:   02/09/17 0924 02/09/17 1028  BP: (!) 175/91   Pulse: 60   Resp: 18   Temp: 98.1 F (36.7 C) 98.1 F (36.7 C)  SpO2: 99%     Constitutional: Alert and oriented.  in no acute distress. Eyes:  Conjunctivae are normal.  Head: Atraumatic. Nose: No congestion/rhinnorhea. Mouth/Throat: Mucous membranes are moist.   Neck: No stridor. Painless ROM.  Cardiovascular: Normal rate, regular rhythm. Grossly normal heart sounds.  Good peripheral circulation. Respiratory: Normal respiratory effort.  No retractions. Lungs CTAB. Gastrointestinal: Soft and nontender. No distention. No abdominal bruits. No CVA tenderness. Genitourinary:  Musculoskeletal: No lower extremity tenderness nor edema.  No joint effusions. Neurologic:  + jittery, CN itact, no nystagmus, SILT throughout, strenght 5/5 bilaterally, + tongue fasciculations, does seem to have stutter but able to communicate appropriately Skin:  Skin is warm, dry and intact. No rash noted. Psychiatric: anxious and agitated appearing  ____________________________________________   LABS (all labs ordered are listed, but only abnormal results are displayed)  Results for orders placed or performed during the hospital encounter of 02/09/17 (from the past 24 hour(s))  Ethanol     Status: None   Collection Time: 02/09/17 10:21 AM  Result Value Ref Range   Alcohol, Ethyl (B) <10 <10 mg/dL  Comprehensive metabolic panel     Status: Abnormal   Collection Time: 02/09/17 10:21 AM  Result Value Ref Range   Sodium 140 135 - 145 mmol/L   Potassium 3.9 3.5 - 5.1 mmol/L   Chloride 101 101 - 111 mmol/L   CO2 29 22 - 32 mmol/L   Glucose, Bld 104 (H) 65 - 99 mg/dL   BUN 18 6 - 20 mg/dL   Creatinine, Ser 0.92 0.61 - 1.24 mg/dL   Calcium 9.1 8.9 - 10.3 mg/dL   Total Protein 7.0 6.5 - 8.1 g/dL   Albumin 4.1 3.5 - 5.0 g/dL   AST 29 15 - 41 U/L   ALT 26 17 - 63 U/L   Alkaline Phosphatase 82 38 - 126 U/L   Total Bilirubin 0.6 0.3 - 1.2 mg/dL   GFR calc non Af Amer >60 >60 mL/min   GFR calc Af Amer >60 >60 mL/min   Anion gap 10 5 - 15  Urine Drug Screen, Qualitative     Status: None   Collection Time: 02/09/17 10:21 AM  Result Value Ref Range    Tricyclic, Ur Screen NONE DETECTED NONE DETECTED   Amphetamines, Ur Screen NONE DETECTED NONE DETECTED   MDMA (Ecstasy)Ur Screen NONE DETECTED NONE DETECTED   Cocaine Metabolite,Ur [place] NONE DETECTED NONE DETECTED   Opiate, Ur Screen NONE DETECTED NONE DETECTED   Phencyclidine (PCP) Ur S NONE DETECTED NONE DETECTED   Cannabinoid 50 Ng, Ur [place] NONE DETECTED NONE DETECTED   Barbiturates, Ur Screen NONE DETECTED NONE DETECTED   Benzodiazepine, Ur Scrn NONE DETECTED NONE DETECTED   Methadone Scn, Ur NONE DETECTED NONE DETECTED  Urinalysis, Routine w reflex microscopic     Status: Abnormal   Collection Time: 02/09/17 10:21 AM  Result Value Ref Range   Color, Urine STRAW (A) YELLOW   APPearance CLEAR (A) CLEAR   Specific Gravity, Urine 1.008 1.005 - 1.030   pH 6.0 5.0 - 8.0   Glucose, UA NEGATIVE NEGATIVE mg/dL   Hgb urine dipstick NEGATIVE NEGATIVE   Bilirubin Urine NEGATIVE NEGATIVE   Ketones, ur NEGATIVE NEGATIVE mg/dL   Protein, ur NEGATIVE NEGATIVE mg/dL   Nitrite NEGATIVE NEGATIVE   Leukocytes, UA NEGATIVE NEGATIVE  CBC WITH DIFFERENTIAL     Status: Abnormal   Collection Time: 02/09/17 10:21 AM  Result Value Ref Range   WBC 5.1 3.8 - 10.6 K/uL   RBC 4.47 4.40 - 5.90 MIL/uL   Hemoglobin 14.3 13.0 - 18.0 g/dL   HCT 41.7 40.0 - 52.0 %   MCV 93.2 80.0 - 100.0 fL   MCH 31.9 26.0 - 34.0 pg   MCHC 34.3 32.0 - 36.0 g/dL   RDW 13.0 11.5 - 14.5 %   Platelets 226 150 - 440 K/uL   Neutrophils Relative % 71 %   Neutro Abs 3.7 1.4 - 6.5 K/uL   Lymphocytes Relative 14 %   Lymphs Abs 0.7 (L) 1.0 - 3.6 K/uL   Monocytes Relative 10 %   Monocytes Absolute 0.5 0.2 - 1.0 K/uL   Eosinophils Relative 4 %   Eosinophils Absolute 0.2 0 - 0.7 K/uL   Basophils Relative 1 %   Basophils Absolute 0.0 0 - 0.1 K/uL   ____________________________________________  EKG My review and personal interpretation at Time: 10:22   Indication: syncope  Rate: 60  Rhythm: sinus Axis: normal Other: low voltage, non  specific st and t wave changes, normal intervals ____________________________________________  RADIOLOGY  I personally reviewed all radiographic images ordered to evaluate for the above acute complaints and reviewed radiology reports and findings.  These findings were personally discussed with the patient.  Please see medical record for radiology report.  ____________________________________________   PROCEDURES  Procedure(s) performed:  Procedures    Critical Care performed: no ____________________________________________   INITIAL IMPRESSION / ASSESSMENT AND PLAN / ED COURSE  Pertinent labs & imaging results that were available during my care of the patient were reviewed by me and considered in my medical decision making (see chart for details).  DDX: Dehydration, sepsis, pna, uti, hypoglycemia, cva, drug effect, withdrawal, encephalitis   EAMONN SERMENO is a 51 y.o. who presents to the ED with tremor and confusion as described above.  Patient recently came off his benzodiazepines I do suspect a primary benzodiazepine withdrawal.  We will check blood work as well as provide IV and oral benzos.  No evidence of seizure-like activity at this time.  We will continue to monitor.  Clinical Course as of Feb 10 1223  Fri Feb 09, 2017  1119 Patient was significant improvement after administration but still having some difficulty speaking therefore will MRI to evaluate for any other evidence of ischemic event or structural lesion.  [PR]    Clinical Course User Index [PR] Merlyn Lot, MD   ----------------------------------------- 12:24 PM on 02/09/2017 -----------------------------------------  MRI is reassuring.  Symptoms have continued to improve after Ativan.  I do believe patient is stable and appropriate for discharge home.  ____________________________________________   FINAL CLINICAL IMPRESSION(S) / ED DIAGNOSES  Final diagnoses:  Benzodiazepine withdrawal with  complication ([place])      NEW MEDICATIONS STARTED DURING THIS VISIT:  New Prescriptions   No medications on file     Note:  This document was prepared using Dragon voice recognition software and may include unintentional dictation errors.    Merlyn Lot, MD 02/09/17 1224

## 2017-02-15 ENCOUNTER — Telehealth: Payer: Self-pay | Admitting: Internal Medicine

## 2017-02-15 ENCOUNTER — Other Ambulatory Visit: Payer: Self-pay

## 2017-02-15 DIAGNOSIS — F411 Generalized anxiety disorder: Secondary | ICD-10-CM

## 2017-02-15 MED ORDER — SERTRALINE HCL 100 MG PO TABS: 150.0000 mg | ORAL_TABLET | Freq: Every day | ORAL | 2 refills | Status: DC

## 2017-02-15 NOTE — Telephone Encounter (Signed)
No that time is reserved for [facility].  thursday at 11:30

## 2017-02-15 NOTE — Telephone Encounter (Signed)
Spoke with pt and he stated that he is taking 1mg  clonazepam. Also is it okay to schedule pt for a ED follow up on Tuesday 02/20/2017 at 4:30pm?

## 2017-02-15 NOTE — Telephone Encounter (Signed)
If he has been taking  Both on a regular basis,.  You can refill.  When is he scheduled to see me for ER follow up on hie Clonazepam withdrawal ?  And what dose of clonazepam is he currently taking?

## 2017-02-15 NOTE — Telephone Encounter (Signed)
After speaking with the pharmacist, she stated that the pt has not been taking the wellbutrin regularly because the last time he refilled it was in May. Pharmacist was advised not to refill the medication until pt comes for an office visit per Dr. Derrel Nip.

## 2017-02-15 NOTE — Telephone Encounter (Signed)
Is it still okay to refill the wellbutrin?

## 2017-02-15 NOTE — Telephone Encounter (Signed)
Pt can not do Thursday at 11:30 due to working in [place]. Can we schedule pt for Friday 02/23/2017 at 4:30pm?

## 2017-02-15 NOTE — Telephone Encounter (Signed)
Pharmacy called and stated that pt requested a refill on Wellbutrin. Pharmacist stated that there is a drug interaction between the Wellbutrin and Zoloft, it increases seziure. Please advise, thank you.

## 2017-02-16 NOTE — Telephone Encounter (Signed)
Yes Friday 4:30 is fine

## 2017-02-16 NOTE — Telephone Encounter (Signed)
Can you scheduled this ED follow for 02/23/2017 at 4:30pm.

## 2017-02-19 NOTE — Telephone Encounter (Signed)
I put him on the schedule and called and confirmed, thanks

## 2017-02-23 ENCOUNTER — Ambulatory Visit (INDEPENDENT_AMBULATORY_CARE_PROVIDER_SITE_OTHER): Payer: BLUE CROSS/BLUE SHIELD | Admitting: Internal Medicine

## 2017-02-23 ENCOUNTER — Encounter: Payer: Self-pay | Admitting: Internal Medicine

## 2017-02-23 VITALS — BP 138/82 | HR 63 | Temp 98.0°F | Resp 15 | Ht 72.0 in | Wt 219.4 lb

## 2017-02-23 DIAGNOSIS — F0632 Mood disorder due to known physiological condition with major depressive-like episode: Secondary | ICD-10-CM

## 2017-02-23 DIAGNOSIS — F411 Generalized anxiety disorder: Secondary | ICD-10-CM | POA: Diagnosis not present

## 2017-02-23 DIAGNOSIS — Z23 Encounter for immunization: Secondary | ICD-10-CM | POA: Diagnosis not present

## 2017-02-23 NOTE — Progress Notes (Signed)
Subjective:  Patient ID: [name], male    DOB: [birth year]  Age: 51 y.o. 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 Diagnoses of Need for immunization against influenza and Anxiety state were also pertinent to this visit.  HPI [name] presents for ER follow up . Patient  was treated in ED on Oct 19 for mental status changes attributed to clonazepam  withdrawal.  Patient had run out of his clonazepam and had requested a refill hrough his pharmacy that the office for several days.  The refill was authorized on October 18th but not sent to pharmacy by staff.  He presented to the office  as a walk in with mental status changes c/w with delirium and was sent to ER .He was treated with IV and oral ativan.  MRI brain was done , was normal   Feels depressed. And anxious,  Overwhelmed,  Tired all the time. Takes clonazepam twice daily, the evening dose before bedtime,  Sleeps anywhere from  6 to 10 hours,  Wakes up  Tired.  He has OSA diagnosed by sleep study   10 YRS AGO  But has not seen the ordering pulmonologist, Dr Halford Chessman at [place] in over 2 years. .    Wearing CPAP every night owns his own machine , which is  67 or 51 years OLD but has not had a sleep study in ten years discussed getting a new sleep study /  Has one night time void.     Working full time. Reports constant fatigue and chronic pain in his right shoulder that never healed correctly after his surgery for a work related injury,  But he cannot afford to lose any more time at work.  Working as a Therapist, music .  Still drinking alcohol 2-3 drinks per week.  Was drinking daily until ER visit.  Medications reviewed:  Taking wellbutrin SR 150 mg twice daily ,  Clonazepam 1 mg twice daily , sertraline 150 mg daily   Pain control:  Tramadol 50 mg every 8 hours prn, gabapentin and metocarbamol     Outpatient Medications  Prior to Visit  Medication Sig Dispense Refill  . aspirin EC 81 MG tablet Take 81 mg by mouth daily.    Marland Kitchen BAYER MICROLET LANCETS lancets Use as instructed 100 each 12  . buPROPion (WELLBUTRIN SR) 150 MG 12 hr tablet Take 1 tablet (150 mg total) by mouth 2 (two) times daily. 60 tablet 5  . Clobetasol Propionate 0.05 % shampoo Apply to dry scalp and leave on for 15 minutes 118 mL 5  . clonazePAM (KLONOPIN) 1 MG tablet TAKE 1 TABLET BY MOUTH TWICE A DAY AS NEEDED FOR ANXIETY 60 tablet 2  . dicyclomine (BENTYL) 20 MG tablet TAKE 1 TABLET (20 MG TOTAL) BY MOUTH EVERY 6 (SIX) HOURS. 120 tablet 5  . gabapentin (NEURONTIN) 100 MG capsule TAKE ONE CAPSULE BY MOUTH 3 TIMES A DAY 90 capsule 5  . halobetasol (ULTRAVATE) 0.05 % cream Apply topically 2 (two) times daily. 50 g 0  . hydrocortisone (ANUSOL-HC) 2.5 % rectal cream Place 1 application rectally 2 (two) times daily. 30 g 0  . ibuprofen (ADVIL,MOTRIN) 200 MG tablet Take 400 mg by mouth every 6 (six) hours as needed (pain).    Marland Kitchen levothyroxine (SYNTHROID, LEVOTHROID) 88 MCG tablet TAKE 1 TABLET BY MOUTH DAILY BEFORE BREAKFAST. 30 tablet 4  . lisinopril-hydrochlorothiazide (PRINZIDE,ZESTORETIC) 20-25 MG  tablet TAKE 1 TABLET BY MOUTH DAILY. 30 tablet 5  . methocarbamol (ROBAXIN) 500 MG tablet Take 1 tablet (500 mg total) by mouth every 8 (eight) hours as needed. 90 tablet 5  . metoprolol tartrate (LOPRESSOR) 25 MG tablet TAKE 1 TABLET (25 MG TOTAL) BY MOUTH 2 (TWO) TIMES DAILY. 60 tablet 5  . Multiple Vitamin (MULTIVITAMIN WITH MINERALS) TABS tablet Take 1 tablet by mouth daily.    Marland Kitchen omeprazole (PRILOSEC) 40 MG capsule Take 1 capsule (40 mg total) by mouth 2 (two) times daily. 60 capsule 6  . ondansetron (ZOFRAN) 4 MG tablet Take 1 tablet (4 mg total) by mouth every 8 (eight) hours as needed for nausea or vomiting. 20 tablet 0  . Pramoxine-HC 1-2 % GEL Apply 1 application topically 2 (two) times daily. 29 g 3  . promethazine (PHENERGAN) 12.5 MG tablet Take  1 tablet (12.5 mg total) by mouth every 8 (eight) hours as needed for nausea or vomiting. 20 tablet 0  . rosuvastatin (CRESTOR) 20 MG tablet Take 1 tablet (20 mg total) by mouth daily. 90 tablet 2  . sertraline (ZOLOFT) 100 MG tablet Take 1.5 tablets (150 mg total) by mouth daily. 45 tablet 2  . traMADol (ULTRAM) 50 MG tablet Take 1 tablet (50 mg total) by mouth every 8 (eight) hours as needed. 90 tablet 5  . cephALEXin (KEFLEX) 500 MG capsule Take 1 capsule (500 mg total) by mouth 4 (four) times daily. (Patient not taking: Reported on 02/23/2017) 28 capsule 0  . clonazePAM (KLONOPIN) 0.5 MG tablet Take 1 tablet (0.5 mg total) by mouth 2 (two) times daily as needed for anxiety. (Patient not taking: Reported on 02/23/2017) 60 tablet 0  . orphenadrine (NORFLEX) 100 MG tablet Take 1 tablet (100 mg total) by mouth 2 (two) times daily. (Patient not taking: Reported on 02/23/2017) 10 tablet 0  . oxyCODONE-acetaminophen (ROXICET) 5-325 MG tablet Take 1 tablet by mouth every 6 (six) hours as needed. (Patient not taking: Reported on 02/23/2017) 20 tablet 0  . predniSONE (DELTASONE) 10 MG tablet 6 tablets on Day 1 , then reduce by 1 tablet daily until gone (Patient not taking: Reported on 02/23/2017) 21 tablet 0  . zolpidem (AMBIEN) 10 MG tablet Ambien 10 mg tablet  1 po hs prn insomnia     No facility-administered medications prior to visit.     Review of Systems;  Patient denies headache, fevers, malaise, unintentional weight loss, skin rash, eye pain, sinus congestion and sinus pain, sore throat, dysphagia,  hemoptysis , cough, dyspnea, wheezing, chest pain, palpitations, orthopnea, edema, abdominal pain, nausea, melena, diarrhea, constipation, flank pain, dysuria, hematuria, urinary  Frequency, nocturia, numbness, tingling, seizures,  Focal weakness, Loss of consciousness,  Tremor, insomnia, depression, anxiety, and suicidal ideation.      Objective:  BP 138/82 (BP Location: Left Arm, Patient Position:  Sitting, Cuff Size: Normal)   Pulse 63   Temp 98 F (36.7 C) (Oral)   Resp 15   Ht 6' (1.829 m)   Wt 219 lb 6.4 oz (99.5 kg)   SpO2 97%   BMI 29.76 kg/m   BP Readings from Last 3 Encounters:  02/23/17 138/82  02/09/17 (!) 172/96  12/12/16 130/68    Wt Readings from Last 3 Encounters:  02/23/17 219 lb 6.4 oz (99.5 kg)  02/09/17 216 lb (98 kg)  12/12/16 216 lb (98 kg)    General appearance: alert, cooperative and appears stated age Ears: normal TM's and external ear canals both  ears Throat: lips, mucosa, and tongue normal; teeth and gums normal Neck: no adenopathy, no carotid bruit, supple, symmetrical, trachea midline and thyroid not enlarged, symmetric, no tenderness/mass/nodules Back: symmetric, no curvature. ROM normal. No CVA tenderness. Lungs: clear to auscultation bilaterally Heart: regular rate and rhythm, S1, S2 normal, no murmur, click, rub or gallop Abdomen: soft, non-tender; bowel sounds normal; no masses,  no organomegaly Pulses: 2+ and symmetric Skin: Skin color, texture, turgor normal. No rashes or lesions Lymph nodes: Cervical, supraclavicular, and axillary nodes normal.  Lab Results  Component Value Date   HGBA1C 6.0 10/23/2016   HGBA1C 6.1 06/05/2016   HGBA1C 6.0 09/13/2015    Lab Results  Component Value Date   CREATININE 0.92 02/09/2017   CREATININE 0.93 01/04/2017   CREATININE 1.33 (H) 11/20/2016    Lab Results  Component Value Date   WBC 5.1 02/09/2017   HGB 14.3 02/09/2017   HCT 41.7 02/09/2017   PLT 226 02/09/2017   GLUCOSE 104 (H) 02/09/2017   CHOL 169 06/05/2016   TRIG 157.0 (H) 06/05/2016   HDL 47.00 06/05/2016   LDLDIRECT 110.0 06/05/2016   LDLCALC 91 06/05/2016   ALT 26 02/09/2017   AST 29 02/09/2017   NA 140 02/09/2017   K 3.9 02/09/2017   CL 101 02/09/2017   CREATININE 0.92 02/09/2017   BUN 18 02/09/2017   CO2 29 02/09/2017   TSH 1.98 01/04/2017   PSA 0.65 03/30/2015   INR 0.98 08/27/2015   HGBA1C 6.0 10/23/2016    MICROALBUR <0.7 09/13/2015    Ct Head Wo Contrast  Result Date: 02/09/2017 CLINICAL DATA:  Anxiety, nausea, headache, leg numbness, altered speech sounding garbled, altered level of consciousness, question medication withdrawal ; history hypertension, diabetes mellitus, former smoker EXAM: CT HEAD WITHOUT CONTRAST TECHNIQUE: Contiguous axial images were obtained from the base of the skull through the vertex without intravenous contrast. Sagittal and coronal MPR images reconstructed from axial data set. COMPARISON:  06/10/2016 FINDINGS: Brain: Normal ventricular morphology. No midline shift or mass effect. Normal appearance of brain parenchyma. No intracranial hemorrhage, mass lesion or evidence acute infarction. No extra-axial fluid collections. Vascular: Unremarkable Skull: Intact Sinuses/Orbits: Visualized paranasal sinuses and mastoid air cells clear. Extensive pneumatization of the temporal bones by mastoid air cells especially on LEFT. Other: N/A IMPRESSION: No acute intracranial abnormalities. Electronically Signed   By: Lavonia Dana M.D.   On: 02/09/2017 10:22   Mr Brain Wo Contrast  Result Date: 02/09/2017 CLINICAL DATA:  Altered mental status. Possible medicine withdrawal. Speech disturbance. EXAM: MRI HEAD WITHOUT CONTRAST TECHNIQUE: Multiplanar, multiecho pulse sequences of the brain and surrounding structures were obtained without intravenous contrast. COMPARISON:  Head CT same day. FINDINGS: The study suffers from motion degradation but is sufficiently diagnostic. Brain: The brain has normal appearance without evidence of malformation, atrophy, old or acute small or large vessel infarction, hemorrhage, hydrocephalus or extra-axial collection. No pituitary abnormality. Vascular: Major vessels at the base of the brain show flow. Skull and upper cervical spine: Normal Sinuses/Orbits: Clear/ normal. Other: None significant. IMPRESSION: Motion degraded but otherwise normal appearing exam.  Electronically Signed   By: Nelson Chimes M.D.   On: 02/09/2017 12:16    Assessment & Plan:   Problem List Items Addressed This Visit    Anxiety state    Currently Imanaged with sertraline and clonazepam.  Given his recent episode of benzodiazepine withdrawal , I am reluctant to continue his current dose and have advised him that a gradual reduction in his use  of benzodiazepaines is advised. 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 Hadden's orphenadrine, cephALEXin, oxyCODONE-acetaminophen, zolpidem, and predniSONE. I am also having him maintain his BAYER MICROLET LANCETS, multivitamin with minerals, ibuprofen, aspirin EC, buPROPion, Clobetasol Propionate, omeprazole, promethazine, rosuvastatin, halobetasol, ondansetron, Pramoxine-HC, hydrocortisone, traMADol, methocarbamol, dicyclomine, levothyroxine, gabapentin, metoprolol tartrate, lisinopril-hydrochlorothiazide, clonazePAM, and sertraline.  No orders of the defined types were placed in this encounter.   Medications Discontinued During This Encounter  Medication Reason  . cephALEXin (KEFLEX) 500 MG capsule Patient has not taken in last 30 days  . clonazePAM (KLONOPIN) 0.5 MG tablet Patient has not taken in last 30 days  . orphenadrine (NORFLEX) 100 MG tablet Patient has not taken in last 30 days  .  oxyCODONE-acetaminophen (ROXICET) 5-325 MG tablet Patient has not taken in last 30 days  . predniSONE (DELTASONE) 10 MG tablet Patient has not taken in last 30 days  . zolpidem (AMBIEN) 10 MG tablet Patient has not taken in last 30 days    Follow-up: No Follow-up on file.   [name], MD

## 2017-02-23 NOTE — Patient Instructions (Addendum)
I will repeat your sleep study to see if your setting are correct.   I am recommending a referral to psychiatry if I cannot improve your depression with the medication changes today

## 2017-02-25 NOTE — Assessment & Plan Note (Signed)
Currently Imanaged with sertraline and clonazepam.  Given his recent episode of benzodiazepine withdrawal , I am reluctant to continue his current dose and have advised him that a gradual reduction in his use of benzodiazepaines is advised. Referring to Dr Nicolasa Ducking for help in managing his complicated depression and anxiety

## 2017-02-27 ENCOUNTER — Encounter: Payer: Self-pay | Admitting: Internal Medicine

## 2017-02-27 DIAGNOSIS — G4733 Obstructive sleep apnea (adult) (pediatric): Secondary | ICD-10-CM

## 2017-02-28 NOTE — Telephone Encounter (Signed)
Both are in process  Now

## 2017-03-07 ENCOUNTER — Other Ambulatory Visit: Payer: Self-pay | Admitting: Internal Medicine

## 2017-04-03 ENCOUNTER — Emergency Department
Admission: EM | Admit: 2017-04-03 | Discharge: 2017-04-03 | Disposition: A | Discharge status: Home / Self Care | Payer: Worker's Compensation | Attending: Emergency Medicine | Admitting: Emergency Medicine

## 2017-04-03 ENCOUNTER — Emergency Department: Payer: Worker's Compensation

## 2017-04-03 ENCOUNTER — Other Ambulatory Visit: Payer: Self-pay

## 2017-04-03 DIAGNOSIS — X500XXA Overexertion from strenuous movement or load, initial encounter: Secondary | ICD-10-CM | POA: Insufficient documentation

## 2017-04-03 DIAGNOSIS — Z7982 Long term (current) use of aspirin: Secondary | ICD-10-CM | POA: Diagnosis not present

## 2017-04-03 DIAGNOSIS — Y999 Unspecified external cause status: Secondary | ICD-10-CM | POA: Insufficient documentation

## 2017-04-03 DIAGNOSIS — E785 Hyperlipidemia, unspecified: Secondary | ICD-10-CM | POA: Insufficient documentation

## 2017-04-03 DIAGNOSIS — E119 Type 2 diabetes mellitus without complications: Secondary | ICD-10-CM | POA: Insufficient documentation

## 2017-04-03 DIAGNOSIS — Z79899 Other long term (current) drug therapy: Secondary | ICD-10-CM | POA: Diagnosis not present

## 2017-04-03 DIAGNOSIS — S6991XA Unspecified injury of right wrist, hand and finger(s), initial encounter: Secondary | ICD-10-CM | POA: Diagnosis present

## 2017-04-03 DIAGNOSIS — S63591A Other specified sprain of right wrist, initial encounter: Secondary | ICD-10-CM | POA: Insufficient documentation

## 2017-04-03 DIAGNOSIS — I10 Essential (primary) hypertension: Secondary | ICD-10-CM | POA: Insufficient documentation

## 2017-04-03 DIAGNOSIS — S63501A Unspecified sprain of right wrist, initial encounter: Secondary | ICD-10-CM

## 2017-04-03 DIAGNOSIS — Y9389 Activity, other specified: Secondary | ICD-10-CM | POA: Insufficient documentation

## 2017-04-03 DIAGNOSIS — E039 Hypothyroidism, unspecified: Secondary | ICD-10-CM | POA: Insufficient documentation

## 2017-04-03 DIAGNOSIS — G8929 Other chronic pain: Secondary | ICD-10-CM | POA: Diagnosis not present

## 2017-04-03 DIAGNOSIS — Y929 Unspecified place or not applicable: Secondary | ICD-10-CM | POA: Insufficient documentation

## 2017-04-03 DIAGNOSIS — Z87891 Personal history of nicotine dependence: Secondary | ICD-10-CM | POA: Insufficient documentation

## 2017-04-03 MED ORDER — TRAMADOL HCL 50 MG PO TABS: 50.0000 mg | ORAL_TABLET | Freq: Four times a day (QID) | ORAL | 0 refills | Status: DC | PRN

## 2017-04-03 MED ORDER — NAPROXEN 500 MG PO TABS: 500.0000 mg | ORAL_TABLET | Freq: Two times a day (BID) | ORAL | Status: DC

## 2017-04-03 MED ORDER — NAPROXEN 500 MG PO TABS
500.0000 mg | ORAL_TABLET | Freq: Once | ORAL | Status: AC
  Administered 2017-04-03: 500 mg via ORAL
  Filled 2017-04-03: qty 1

## 2017-04-03 NOTE — ED Provider Notes (Signed)
[facility] Emergency Department Provider Note   ____________________________________________   First MD Initiated Contact with Patient 04/03/17 1128     (approximate)  I have reviewed the triage vital signs and the nursing notes.   HISTORY  Chief Complaint Wrist Pain    HPI [name] is a 51 y.o. male patient complaining of right wrist pain secondary to contusion 3 days ago. Patient state he was a heavy bags of salt into the back of a truck when he slipped and the bag fell on his wrist. Patient stated pain is at the ulnar aspect of his right wrist.Patient rates pain as a 6/10. Patient described a pain as "achy". Patient stated pain increases with extension flexion of the wrist. No palliative measures for complaint.   Past Medical History:  Diagnosis Date  . Anxiety   . Anxiety and depression   . Bruit    L  . Chest pain    hx  . Diabetes mellitus without complication ([place])    "borderline"  . Edema   . Fatty liver   . GERD (gastroesophageal reflux disease)    uses Omeprazole  . Goiter   . HLD (hyperlipidemia)   . Hypertension    essential, benign  . Hypothyroidism   . Impotence of organic origin   . Murmur   . Other chest pain    tightness, pressure  . Palpitation    hx  . Precordial pain   . Sleep apnea    uses cpap    Patient Active Problem List   Diagnosis Date Noted  . Pancreatitis, alcoholic, acute 76/73/4193  . Angioedema of lips 11/26/2016  . Cognitive complaints with normal neuropsychological exam 10/23/2016  . 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 Hypothyroidism due to acquired atrophy of thyroid 09/14/2015  . Arthritis of knee 06/23/2014  . Enlarged RV (right ventricle) 06/17/2014  . Cervical spine degeneration 02/17/2014  . Overweight 10/29/2013  . Chronic knee pain 10/29/2013  . Diabetes  mellitus type 2 in obese ([place]) 10/23/2012  . Seborrheic dermatitis, unspecified 05/17/2012  . Fatty liver 04/22/2012  . Polyarthralgia 12/24/2010  . Sciatica of right side 12/24/2010  . Hyperlipidemia associated with type 2 diabetes mellitus ([place]) 01/13/2009  . Anxiety state 01/13/2009  . Essential hypertension 01/13/2009  . OSA (obstructive sleep apnea) 01/13/2009    Past Surgical History:  Procedure Laterality Date  . APPENDECTOMY  2002   done at [facility]  . CARDIAC CATHETERIZATION    . CARDIAC CATHETERIZATION N/A 08/27/2015   Procedure: Left Heart Cath and Coronary Angiography;  Surgeon: Jolaine Artist, MD;  Location: [place] [facility];  Service: Cardiovascular;  Laterality: N/A;  . COLONOSCOPY    . HERNIA REPAIR  7-90-24   umbilical  . JOINT REPLACEMENT     right knee  . REVISION TOTAL KNEE ARTHROPLASTY Right 06/23/2014   DR Marlou Sa  . TONSILLECTOMY    . TOTAL KNEE ARTHROPLASTY Right 2011   right  . TOTAL KNEE REVISION Right 06/23/2014   Procedure: TOTAL KNEE REVISION;  Surgeon: Meredith Pel, MD;  Location: [place];  Service: Orthopedics;  Laterality: Right;    Prior to Admission medications   Medication Sig Start Date End Date Taking? Authorizing Provider  aspirin EC 81 MG tablet Take 81 mg by mouth daily.    [provider]  BAYER MICROLET LANCETS lancets Use as  instructed 05/30/13   [name], MD  buPROPion [facility] SR) 150 MG 12 hr tablet Take 1 tablet (150 mg total) by mouth 2 (two) times daily. 06/05/16   [name], MD  Clobetasol Propionate 0.05 % shampoo Apply to dry scalp and leave on for 15 minutes 06/05/16   [name], MD  clonazePAM (KLONOPIN) 1 MG tablet TAKE 1 TABLET BY MOUTH TWICE A DAY AS NEEDED FOR ANXIETY 02/09/17   [name], MD  dicyclomine (BENTYL) 20 MG tablet TAKE 1 TABLET (20 MG TOTAL) BY MOUTH EVERY 6 (SIX) HOURS. 12/11/16   [name], MD  gabapentin (NEURONTIN) 100 MG capsule TAKE ONE CAPSULE BY MOUTH 3 TIMES A  DAY 12/19/16   [name], MD  halobetasol (ULTRAVATE) 0.05 % cream Apply topically 2 (two) times daily. 09/08/16   [name], MD  hydrocortisone (ANUSOL-HC) 2.5 % rectal cream Place 1 application rectally 2 (two) times daily. 11/24/16   [name], MD  ibuprofen (ADVIL,MOTRIN) 200 MG tablet Take 400 mg by mouth every 6 (six) hours as needed (pain).    [provider]  levothyroxine (SYNTHROID, LEVOTHROID) 88 MCG tablet TAKE 1 TABLET BY MOUTH DAILY BEFORE BREAKFAST. 12/11/16   [name], MD  lisinopril-hydrochlorothiazide (PRINZIDE,ZESTORETIC) 20-25 MG tablet TAKE 1 TABLET BY MOUTH DAILY. 02/01/17   [name], MD  methocarbamol (ROBAXIN) 500 MG tablet Take 1 tablet (500 mg total) by mouth every 8 (eight) hours as needed. 11/24/16   [name], MD  metoprolol tartrate (LOPRESSOR) 25 MG tablet TAKE 1 TABLET (25 MG TOTAL) BY MOUTH 2 (TWO) TIMES DAILY. 02/01/17   [name], MD  Multiple Vitamin (MULTIVITAMIN WITH MINERALS) TABS tablet Take 1 tablet by mouth daily.    [provider]  naproxen (NAPROSYN) 500 MG tablet Take 1 tablet (500 mg total) by mouth 2 (two) times daily with a meal. 04/03/17   Sable Feil, PA-C  omeprazole (PRILOSEC) 40 MG capsule Take 1 capsule (40 mg total) by mouth 2 (two) times daily. 06/05/16   [name], MD  ondansetron (ZOFRAN) 4 MG tablet Take 1 tablet (4 mg total) by mouth every 8 (eight) hours as needed for nausea or vomiting. 11/20/16   Rudene Re, MD  Pramoxine-HC 1-2 % GEL Apply 1 application topically 2 (two) times daily. 11/24/16   [name], MD  promethazine (PHENERGAN) 12.5 MG tablet Take 1 tablet (12.5 mg total) by mouth every 8 (eight) hours as needed for nausea or vomiting. 06/05/16   [name], MD  rosuvastatin (CRESTOR) 20 MG tablet TAKE 1 TABLET (20 MG TOTAL) BY MOUTH DAILY. 03/07/17   [name], MD  sertraline (ZOLOFT) 100 MG tablet Take 1.5 tablets (150 mg total) by mouth daily.  02/15/17   [name], MD  traMADol (ULTRAM) 50 MG tablet Take 1 tablet (50 mg total) by mouth every 8 (eight) hours as needed. 11/24/16   [name], MD  traMADol (ULTRAM) 50 MG tablet Take 1 tablet (50 mg total) by mouth every 6 (six) hours as needed. 04/03/17 04/03/18  Sable Feil, PA-C    Allergies Zolpidem  Family History  Problem Relation Age of Onset  . Heart disease Father     Social History Social History   Tobacco Use  . Smoking status: Former Smoker    Packs/day: 2.00    Years: 18.00    Pack years: 36.00  Types: Cigarettes    Last attempt to quit: 04/24/2005    Years since quitting: 11.9  . Smokeless tobacco: Never Used  . Tobacco comment: quit 20 years ago   Substance Use Topics  . Alcohol use: Yes    Alcohol/week: 10.2 oz    Types: 3 Shots of liquor, 14 Standard drinks or equivalent per week    Comment: 1 a day  . Drug use: No    Review of Systems  Constitutional: No fever/chills Eyes: No visual changes. ENT: No sore throat. Cardiovascular: Denies chest pain. Respiratory: Denies shortness of breath. Gastrointestinal: No abdominal pain.  No nausea, no vomiting.  No diarrhea.  No constipation. Genitourinary: Negative for dysuria. Musculoskeletal: Negative for back pain. Skin: Negative for rash. Neurological: Negative for headaches, focal weakness or numbness. Psychiatric:Anxiety and depression Endocrine:Diabetes, hyperlipidemia, hypothyroidism, and hypertension. Allergic/Immunilogical: Ambien ____________________________________________   PHYSICAL EXAM:  VITAL SIGNS: ED Triage Vitals  Enc Vitals Group     BP 04/03/17 1109 (!) 147/93     Pulse Rate 04/03/17 1109 69     Resp 04/03/17 1109 18     Temp 04/03/17 1109 98.3 F (36.8 C)     Temp Source 04/03/17 1109 Oral     SpO2 04/03/17 1109 98 %     Weight 04/03/17 1110 212 lb (96.2 kg)     Height 04/03/17 1110 6' (1.829 m)     Head Circumference --      Peak Flow --      Pain  Score 04/03/17 1058 6     Pain Loc --      Pain Edu? --      Excl. in [place]? --     Constitutional: Alert and oriented. Well appearing and in no acute distress. Cardiovascular: Normal rate, regular rhythm. Grossly normal heart sounds.  Good peripheral circulation. Respiratory: Normal respiratory effort.  No retractions. Lungs CTAB. Gastrointestinal: Soft and nontender. No distention. No abdominal bruits. No CVA tenderness. Musculoskeletal: No deformity to the right wrist. Patient decreased range of motion with flexion and extension. Neurologic:  Normal speech and language. No gross focal neurologic deficits are appreciated. No gait instability. Skin:  Skin is warm, dry and intact. No rash noted. Psychiatric: Mood and affect are normal. Speech and behavior are normal.  ____________________________________________   LABS (all labs ordered are listed, but only abnormal results are displayed)  Labs Reviewed - No data to display ____________________________________________  EKG   ____________________________________________  RADIOLOGY  Dg Wrist Complete Right  Result Date: 04/03/2017 CLINICAL DATA:  Work injury. EXAM: RIGHT WRIST - COMPLETE 3+ VIEW COMPARISON:  No recent. FINDINGS: Sclerotic density noted about the distal right radius. This may represent a bone island. Mild diffuse degenerative change. No acute bony or joint abnormality. IMPRESSION: Mild diffuse degenerative change.  No acute abnormality . Electronically Signed   By: Marcello Moores  Register   On: 04/03/2017 11:43    ____________________________________________   PROCEDURES  Procedure(s) performed: None  Procedures  Critical Care performed: No  ____________________________________________   INITIAL IMPRESSION / ASSESSMENT AND PLAN / ED COURSE  As part of my medical decision making, I reviewed the following data within the electronic MEDICAL RECORD NUMBER    Wrist pain secondary to sprain and contusion. Discussed  neck x-ray finding with patient. Patient given discharge care instruction placement of splint. Patient advised follow-up PCP if pain persists.      ____________________________________________   FINAL CLINICAL IMPRESSION(S) / ED DIAGNOSES  Final diagnoses:  Sprain of right wrist,  initial encounter     ED Discharge Orders        Ordered    traMADol (ULTRAM) 50 MG tablet  Every 6 hours PRN     04/03/17 1203    naproxen (NAPROSYN) 500 MG tablet  2 times daily with meals     04/03/17 1203       Note:  This document was prepared using Dragon voice recognition software and may include unintentional dictation errors.    Sable Feil, PA-C 04/03/17 1207    Rudene Re, MD 04/06/17 [phone]

## 2017-04-03 NOTE — ED Triage Notes (Signed)
Pt states he injured his right wrist Saturday lifting heavy bags while at work.Marland Kitchen

## 2017-04-03 NOTE — Discharge Instructions (Signed)
Wrist splint for 3-5 days as needed.

## 2017-04-18 ENCOUNTER — Telehealth: Payer: Self-pay | Admitting: *Deleted

## 2017-04-18 NOTE — Telephone Encounter (Signed)
Copied from [place] [id number]. Topic: Referral - Status >> Apr 18, 2017  7:57 AM Scherrie Gerlach wrote: Pt states he received a letter from [facility] his sleep study was not approved. Pt is scheduled for Friday Dec 28. Pt states they did not receive supporting info. Pt states the hospital called to set up. Pt needs a call back asap.  There is a note from 11/06 were Dr Derrel Nip states it is in process.

## 2017-04-19 NOTE — Telephone Encounter (Signed)
Yes,  Whatever his insurance will pay for!  Thank you MT

## 2017-04-19 NOTE — Telephone Encounter (Signed)
Spoke to pt-he is willing to do the home sleep study. I will send his order to [facility] so they can set this up for him if this is ok- Lenna Sciara

## 2017-04-19 NOTE — Telephone Encounter (Signed)
Copied from [place] [phone]. Topic: Referral - Status >> Apr 18, 2017  7:57 AM Scherrie Gerlach wrote: Pt states he received a letter from [facility] [place] his sleep study was not approved. Pt is scheduled for Friday Dec 28. Pt states they did not receive supporting info. Pt states the hospital called to set up. Pt needs a call back asap.  There is a note from 11/06 were Dr Derrel Nip states it is in process.   >> Apr 19, 2017 10:18 AM Yvette Rack wrote: Patient would like to speak with Juliann Pulse he states that she knows why he is calling and that he would like to speak with her today about sleep study and his insurance

## 2017-04-20 NOTE — Telephone Encounter (Signed)
Copied from [place] [phone]. Topic: Referral - Status >> Apr 18, 2017  7:57 AM Scherrie Gerlach wrote: Pt states he received a letter from [facility] [place] his sleep study was not approved. Pt is scheduled for Friday Dec 28. Pt states they did not receive supporting info. Pt states the hospital called to set up. Pt needs a call back asap.  There is a note from 11/06 were Dr Derrel Nip states it is in process.   >> Apr 19, 2017 10:18 AM Yvette Rack wrote: Patient would like to speak with Juliann Pulse he states that she knows why he is calling and that he would like to speak with her today about sleep study and his insurance >> Apr 20, 2017  4:00 PM [place] Kerbs wrote: Patient only wants to speak to [facility]

## 2017-04-20 NOTE — Telephone Encounter (Signed)
Pt would like Melissa to give him a call. States he has some very important questions to ask her.

## 2017-04-23 NOTE — Telephone Encounter (Signed)
Spoke to pt on Friday Dec. 28. He is upset. He was told that peer to peer was sent to Dr. Derrel Nip for her to do in order for him to do the in lab test. I told him that it was sent to her while she was on vacation and had expired by the time she came back. He does not want to do the home sleep study stating that "something can happen and disturb him". I told him that I can TRY to talk to his insurance to open up the peer to peer again (although it is very unlikely since it is closed) for Dr. Derrel Nip to do.  IF THEY REOPEN THE CASE can yo do the peer to peer?

## 2017-04-23 NOTE — Telephone Encounter (Signed)
yes

## 2017-04-24 HISTORY — PX: COLONOSCOPY: SHX174

## 2017-04-25 ENCOUNTER — Other Ambulatory Visit: Payer: Self-pay | Admitting: Internal Medicine

## 2017-04-25 DIAGNOSIS — G4733 Obstructive sleep apnea (adult) (pediatric): Secondary | ICD-10-CM

## 2017-04-27 ENCOUNTER — Telehealth: Payer: Self-pay

## 2017-04-27 NOTE — Telephone Encounter (Signed)
Copied from [place] [phone]. Topic: Referral - Status >> Apr 27, 2017  3:18 PM Ahmed Prima L wrote: Patient only wants to speak with [facility] regarding his sleep study & wants a call back today

## 2017-04-27 NOTE — Telephone Encounter (Signed)
Spoke with pt and he stated that he "did not any of the information" that was asked and he stated that he "did not know where we could get the information from but ya'll could try calling Dr. Collie Siad office or [place] that I get my supplies from".

## 2017-04-27 NOTE — Telephone Encounter (Signed)
LMTCB. PEC may ask pt the questions listed below from Dr. Derrel Nip.

## 2017-04-27 NOTE — Telephone Encounter (Signed)
I cannot write the appeal letter without the following information   [place] download or compliance report  Settings and make/model of his CPAP machine

## 2017-05-01 ENCOUNTER — Telehealth: Payer: Self-pay

## 2017-05-01 ENCOUNTER — Telehealth: Payer: Self-pay | Admitting: Internal Medicine

## 2017-05-01 DIAGNOSIS — G4733 Obstructive sleep apnea (adult) (pediatric): Secondary | ICD-10-CM

## 2017-05-01 NOTE — Telephone Encounter (Signed)
Copied from [place] [id number]. Topic: General - Other >> May 01, 2017 11:57 AM Lolita Rieger, RMA wrote: Reason for CRM: Pt stated that would like call bach in regards to his CPAP

## 2017-05-01 NOTE — Telephone Encounter (Signed)
Copied from [place] [phone]. Topic: General - Other >> May 01, 2017 11:57 AM Lolita Rieger, RMA wrote: Reason for CRM: Pt stated that would like call bach in regards to his CPAP

## 2017-05-01 NOTE — Telephone Encounter (Signed)
Spoke with pt and informed him that the appeal has been denied and that Dr. Derrel Nip has referred him back to pulmonology. Pt gave a verbal understanding.

## 2017-05-01 NOTE — Telephone Encounter (Signed)
Noted  

## 2017-05-01 NOTE — Telephone Encounter (Signed)
patient notified via my chart.  Pulmonology referral made.

## 2017-05-01 NOTE — Telephone Encounter (Signed)
Copied from [place] [phone]. Topic: General - Other >> May 01, 2017 12:06 PM Ether Griffins B wrote: Reason for CRM: Tiffany with [facility] calling in regards to a PA on sleep study for pt. It has been denied.  Please call back at [phone] if you have any further questions.

## 2017-05-02 ENCOUNTER — Other Ambulatory Visit: Payer: Self-pay | Admitting: Internal Medicine

## 2017-05-11 ENCOUNTER — Ambulatory Visit (INDEPENDENT_AMBULATORY_CARE_PROVIDER_SITE_OTHER): Payer: BLUE CROSS/BLUE SHIELD | Admitting: Orthopedic Surgery

## 2017-05-11 ENCOUNTER — Encounter (INDEPENDENT_AMBULATORY_CARE_PROVIDER_SITE_OTHER): Payer: Self-pay | Admitting: Orthopedic Surgery

## 2017-05-11 DIAGNOSIS — M75121 Complete rotator cuff tear or rupture of right shoulder, not specified as traumatic: Secondary | ICD-10-CM | POA: Diagnosis not present

## 2017-05-12 ENCOUNTER — Other Ambulatory Visit: Payer: Self-pay | Admitting: Internal Medicine

## 2017-05-12 NOTE — Progress Notes (Signed)
Office Visit Note   Patient: [name]           Date of Birth: 11-Sep-1965           MRN: [id number] Visit Date: 05/11/2017 Requested by: [name], MD [address], [place] [address] PCP: [name], MD  Subjective: Chief Complaint  Patient presents with  . Right Shoulder - Pain  . Left Shoulder - Pain    HPI: [name] is a patient with bilateral shoulder pain.  Reports left shoulder weakness.  He had superior capsular reconstruction on that side for unrepairable left shoulder rotator cuff tear.  He also had a right shoulder rotator cuff tear as a Workmen's Comp. injury treated with arthroscopic repair which subsequently failed.  Surgery was done in January 2018 and subsequent MRI scan in August 2018 demonstrated a retracted supraspinatus tear of about 3 cm with muscle atrophy he is on permanent restrictions of right shoulder no lifting more than 25 pounds.  He is rated at 20% of the right shoulder.  He is right-hand dominant.              ROS: All systems reviewed are negative as they relate to the chief complaint within the history of present illness.  Patient denies  fevers or chills.   Assessment & Plan: Visit Diagnoses:  1. Complete tear of right rotator cuff     Plan: Impression is bilateral shoulder pain with irreparable right sided rotator cuff tear.  General and in summary [name] has a problem with his right shoulder which may need surgical intervention in the form of obstruction.  As with the left shoulder that would be a salvage operation.  Following that his options for treatment would be reverse shoulder replacement.  It is very young for that.  He wanted to be aware of his options.  I think injections and conservative treatment are good place to start.  Superior capsular reconstruction would be an operation to consider for pain relief but not improved function.  Reverse shoulder replacement should be withheld until patient is closer to  52 years old.  Follow-Up Instructions: Return if symptoms worsen or fail to improve.   Orders:  No orders of the defined types were placed in this encounter.  No orders of the defined types were placed in this encounter.     Procedures: No procedures performed   Clinical Data: No additional findings.  Objective: Vital Signs: There were no vitals taken for this visit.  Physical Exam:   Constitutional: Patient appears well-developed HEENT:  Head: Normocephalic Eyes:EOM are normal Neck: Normal range of motion Cardiovascular: Normal rate Pulmonary/chest: Effort normal Neurologic: Patient is alert Skin: Skin is warm Psychiatric: Patient has normal mood and affect    Ortho Exam: Orthopedic exam demonstrates good cervical spine range of motion.  No restriction of passive external rotation at 15 degrees of abduction on either shoulder.  Patient has some weakness to infraspinatus and supraspinatus testing on both sides.  Not much in the way of coarse grinding or crepitus with passive range of motion on the right shoulder.  Subscap strength is intact on the right shoulder.  No other masses lymphadenopathy or skin changes noted in the right shoulder.Marland Kitchen  Specialty Comments:  No specialty comments available.  Imaging: No results found.   PMFS History: Patient Active Problem List   Diagnosis Date Noted  . Pancreatitis, alcoholic, acute 85/05/7739  . Angioedema of lips 11/26/2016  . Cognitive complaints  with normal neuropsychological exam 10/23/2016  . 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 Hypothyroidism due to acquired atrophy of thyroid 09/14/2015  . Arthritis of knee 06/23/2014  . Enlarged RV (right ventricle) 06/17/2014  . Cervical spine degeneration 02/17/2014  . Overweight 10/29/2013  . Chronic knee pain 10/29/2013  . Diabetes mellitus type 2 in  obese ([place]) 10/23/2012  . Seborrheic dermatitis, unspecified 05/17/2012  . Fatty liver 04/22/2012  . Polyarthralgia 12/24/2010  . Sciatica of right side 12/24/2010  . Hyperlipidemia associated with type 2 diabetes mellitus ([place]) 01/13/2009  . Anxiety state 01/13/2009  . Essential hypertension 01/13/2009  . OSA (obstructive sleep apnea) 01/13/2009   Past Medical History:  Diagnosis Date  . Anxiety   . Anxiety and depression   . Bruit    L  . Chest pain    hx  . Diabetes mellitus without complication ([place])    "borderline"  . Edema   . Fatty liver   . GERD (gastroesophageal reflux disease)    uses Omeprazole  . Goiter   . HLD (hyperlipidemia)   . Hypertension    essential, benign  . Hypothyroidism   . Impotence of organic origin   . Murmur   . Other chest pain    tightness, pressure  . Palpitation    hx  . Precordial pain   . Sleep apnea    uses cpap    Family History  Problem Relation Age of Onset  . Heart disease Father     Past Surgical History:  Procedure Laterality Date  . APPENDECTOMY  2002   done at [facility]  . CARDIAC CATHETERIZATION    . CARDIAC CATHETERIZATION N/A 08/27/2015   Procedure: Left Heart Cath and Coronary Angiography;  Surgeon: Jolaine Artist, MD;  Location: [place] [facility];  Service: Cardiovascular;  Laterality: N/A;  . COLONOSCOPY    . HERNIA REPAIR  8-65-78   umbilical  . JOINT REPLACEMENT     right knee  . REVISION TOTAL KNEE ARTHROPLASTY Right 06/23/2014   DR Marlou Sa  . TONSILLECTOMY    . TOTAL KNEE ARTHROPLASTY Right 2011   right  . TOTAL KNEE REVISION Right 06/23/2014   Procedure: TOTAL KNEE REVISION;  Surgeon: Meredith Pel, MD;  Location: [facility];  Service: Orthopedics;  Laterality: Right;   Social History   Occupational History  . Occupation: IT trainer  . Occupation: SERVICE DIRECTOR    Employer: [employer]  Tobacco Use  . Smoking status: Former Smoker     Packs/day: 2.00    Years: 18.00    Pack years: 36.00    Types: Cigarettes    Last attempt to quit: 04/24/2005    Years since quitting: 12.0  . Smokeless tobacco: Never Used  . Tobacco comment: quit 20 years ago   Substance and Sexual Activity  . Alcohol use: Yes    Alcohol/week: 10.2 oz    Types: 3 Shots of liquor, 14 Standard drinks or equivalent per week    Comment: 1 a day  . Drug use: No  . Sexual activity: Yes

## 2017-05-14 NOTE — Telephone Encounter (Signed)
Refilled

## 2017-05-14 NOTE — Telephone Encounter (Signed)
Refilled: 04/03/2017 Last OV: 02/23/2017 Next OV: not scheduled

## 2017-05-15 ENCOUNTER — Encounter: Payer: Self-pay | Admitting: Internal Medicine

## 2017-05-15 NOTE — Telephone Encounter (Signed)
Printed, signed and faxed.  

## 2017-05-16 ENCOUNTER — Ambulatory Visit (INDEPENDENT_AMBULATORY_CARE_PROVIDER_SITE_OTHER): Payer: BLUE CROSS/BLUE SHIELD | Admitting: Internal Medicine

## 2017-05-16 ENCOUNTER — Encounter: Payer: Self-pay | Admitting: Internal Medicine

## 2017-05-16 VITALS — BP 132/74 | HR 88 | Resp 16 | Ht 72.0 in | Wt 213.0 lb

## 2017-05-16 DIAGNOSIS — G4719 Other hypersomnia: Secondary | ICD-10-CM | POA: Diagnosis not present

## 2017-05-16 DIAGNOSIS — G4733 Obstructive sleep apnea (adult) (pediatric): Secondary | ICD-10-CM | POA: Diagnosis not present

## 2017-05-16 NOTE — Progress Notes (Signed)
[facility] Consultation      Assessment and Plan:  Obstructive sleep apnea with persistent excessive daytime sleepiness. - Review of the patient's download data shows that [name] has had inadequate use of CPAP with residual slightly elevated AHI.  Patient is also on multiple medications which could be contributing to excessive daytime sleepiness. -We discussed that [name] would make a concerted effort to use his CPAP nightly, for the whole night over the next 4-6 weeks to see if this helps with his symptoms.  We will then review download data at that time to see if this has made a difference.  If not we may consider repeat sleep study versus contributing persistent sleepiness to sedating medications.  Anxiety, GERD, nausea. -We discussed some of the anti-nausea and antianxiety medications, such as Klonopin, Bentyl, Robaxin may contribute to daytime sleepiness.  [name] is currently in the process of being weaned down on Klonopin which may help.   Date: 05/16/2017  MRN# [id number] [name] May 08, 1965   [name] is a 52 y.o. old male seen in consultation for chief complaint of:    Chief Complaint  Patient presents with  . Advice Only    referred by Dr. Derrel Nip for OSA, former Dr. Halford Chessman patient.    HPI:   Patient presents today with feeling of fatigue.  [name] typically goes to bed between 10 and 11 PM, [name] falls asleep within 20 minutes.  [name] wakes up 2-3 times per night, [name] gets out of bed at around 6:30 AM.  [name] is lost approximately 20 pounds over the last 2 years.  His Epworth score is 17 today [name] was last seen by pulmonary in [place] in 2015, review of that data from download shows that [name] had been using his CPAP every night for an average of 8 hours per night, with a residual AHI of 4.1.  Over review of most recent data shows that [name] is using it only approximately half of nights. She was sent for a sleep study by Dr. Derrel Nip but this was declined. [name] does not have any  issues with his current machine, and thinks that it is working ok. [name] does note that [name] has taken it off a few nights and does not have difficulty with that. [name] continues on a full face mask with and does ok with this.    [name] is on a number of medications which she was not on previously, when [name] was started on CPAP.  These include Klonopin 0.5mg  twice daily, Bentyl 20 mg bid, gabapentin bid, Robaxin 500 mg 4 times per week, Lopressor 25 mg twice daily, promethazine as needed for vomiting about once per month, sertraline 200 mg daily, tramadol about twice per week. [name] uses [facility] for his DME.  No sleep walking or sleep attacks.   Tests: PSG 01/21/07>>AHI 18, SpO2 low 56%. CPAP 7 cm H2O>>AHI 2.6 **Personal review of download data 30 days as of 05/15/17 usage greater than 4 hours is 15/30 days, average use  on days used is 6 hours 13 minutes.  Pressure is 5-15.  Median pressure is 9.2, 95th percentile pressure is 11.8, maximum pressure is 13.  Residual AHI is 4.8.  Imaging personally reviewed chest x-ray 11/20/16; lungs are unremarkable.   PMHX:   Past Medical History:  Diagnosis Date  . Anxiety   . Anxiety and depression   . Bruit    L  . Chest pain    hx  . Diabetes mellitus without  complication ([place])    "borderline"  . Edema   . Fatty liver   . GERD (gastroesophageal reflux disease)    uses Omeprazole  . Goiter   . HLD (hyperlipidemia)   . Hypertension    essential, benign  . Hypothyroidism   . Impotence of organic origin   . Murmur   . Other chest pain    tightness, pressure  . Palpitation    hx  . Precordial pain   . Sleep apnea    uses cpap   Surgical Hx:  Past Surgical History:  Procedure Laterality Date  . APPENDECTOMY  2002   done at [facility]  . CARDIAC CATHETERIZATION    . CARDIAC CATHETERIZATION N/A 08/27/2015   Procedure: Left Heart Cath and Coronary Angiography;  Surgeon: Jolaine Artist, MD;  Location: [facility];  Service: Cardiovascular;  Laterality:  N/A;  . COLONOSCOPY    . HERNIA REPAIR  7-62-83   umbilical  . JOINT REPLACEMENT     right knee  . REVISION TOTAL KNEE ARTHROPLASTY Right 06/23/2014   DR Marlou Sa  . TONSILLECTOMY    . TOTAL KNEE ARTHROPLASTY Right 2011   right  . TOTAL KNEE REVISION Right 06/23/2014   Procedure: TOTAL KNEE REVISION;  Surgeon: Meredith Pel, MD;  Location: [facility];  Service: Orthopedics;  Laterality: Right;   Family Hx:  Family History  Problem Relation Age of Onset  . Heart disease Father    Social Hx:   Social History   Tobacco Use  . Smoking status: Former Smoker    Packs/day: 2.00    Years: 18.00    Pack years: 36.00    Types: Cigarettes    Last attempt to quit: 04/24/2005    Years since quitting: 12.0  . Smokeless tobacco: Never Used  . Tobacco comment: quit 20 years ago   Substance Use Topics  . Alcohol use: Yes    Alcohol/week: 10.2 oz    Types: 3 Shots of liquor, 14 Standard drinks or equivalent per week    Comment: 1 a day  . Drug use: No   Medication:    Current Outpatient Medications:  .  aspirin EC 81 MG tablet, Take 81 mg by mouth daily., Disp: , Rfl:  .  BAYER MICROLET LANCETS lancets, Use as instructed, Disp: 100 each, Rfl: 12 .  Clobetasol Propionate 0.05 % shampoo, Apply to dry scalp and leave on for 15 minutes, Disp: 118 mL, Rfl: 5 .  clonazePAM (KLONOPIN) 1 MG tablet, TAKE 1 TABLET BY MOUTH TWICE A DAY AS NEEDED FOR ANXIETY, Disp: 60 tablet, Rfl: 2 .  dicyclomine (BENTYL) 20 MG tablet, TAKE 1 TABLET (20 MG TOTAL) BY MOUTH EVERY 6 (SIX) HOURS., Disp: 120 tablet, Rfl: 5 .  hydrocortisone (ANUSOL-HC) 2.5 % rectal cream, Place 1 application rectally 2 (two) times daily., Disp: 30 g, Rfl: 0 .  ibuprofen (ADVIL,MOTRIN) 200 MG tablet, Take 400 mg by mouth every 6 (six) hours as needed (pain)., Disp: , Rfl:  .  levothyroxine (SYNTHROID, LEVOTHROID) 88 MCG tablet, TAKE 1 TABLET BY MOUTH EVERY DAY BEFORE BREAKFAST, Disp: 30 tablet, Rfl: 4 .  lisinopril-hydrochlorothiazide  (PRINZIDE,ZESTORETIC) 20-25 MG tablet, TAKE 1 TABLET BY MOUTH DAILY., Disp: 30 tablet, Rfl: 5 .  methocarbamol (ROBAXIN) 500 MG tablet, Take 1 tablet (500 mg total) by mouth every 8 (eight) hours as needed., Disp: 90 tablet, Rfl: 5 .  metoprolol tartrate (LOPRESSOR) 25 MG tablet, TAKE 1 TABLET (25 MG TOTAL) BY MOUTH 2 (  TWO) TIMES DAILY., Disp: 60 tablet, Rfl: 5 .  Multiple Vitamin (MULTIVITAMIN WITH MINERALS) TABS tablet, Take 1 tablet by mouth daily., Disp: , Rfl:  .  naproxen (NAPROSYN) 500 MG tablet, Take 1 tablet (500 mg total) by mouth 2 (two) times daily with a meal., Disp: 20 tablet, Rfl: 00 .  omeprazole (PRILOSEC) 40 MG capsule, Take 1 capsule (40 mg total) by mouth 2 (two) times daily., Disp: 60 capsule, Rfl: 6 .  ondansetron (ZOFRAN) 4 MG tablet, Take 1 tablet (4 mg total) by mouth every 8 (eight) hours as needed for nausea or vomiting., Disp: 20 tablet, Rfl: 0 .  Pramoxine-HC 1-2 % GEL, Apply 1 application topically 2 (two) times daily., Disp: 29 g, Rfl: 3 .  promethazine (PHENERGAN) 12.5 MG tablet, Take 1 tablet (12.5 mg total) by mouth every 8 (eight) hours as needed for nausea or vomiting., Disp: 20 tablet, Rfl: 0 .  rosuvastatin (CRESTOR) 20 MG tablet, TAKE 1 TABLET (20 MG TOTAL) BY MOUTH DAILY., Disp: 90 tablet, Rfl: 2 .  sertraline (ZOLOFT) 100 MG tablet, Take 1.5 tablets (150 mg total) by mouth daily., Disp: 45 tablet, Rfl: 2 .  traMADol (ULTRAM) 50 MG tablet, TAKE 1 TABLET BY MOUTH EVERY 6 HOURS AS NEEDED, Disp: 30 tablet, Rfl: 5 .  clonazePAM (KLONOPIN) 0.5 MG tablet, Take 0.5 mg by mouth 2 (two) times daily., Disp: , Rfl: 1 .  gabapentin (NEURONTIN) 300 MG capsule, Take 300 mg by mouth 2 (two) times daily., Disp: , Rfl: 0 .  halobetasol (ULTRAVATE) 0.05 % cream, Apply topically 2 (two) times daily., Disp: 50 g, Rfl: 0   Allergies:  Zolpidem  Review of Systems: Gen:  Denies  fever, sweats, chills HEENT: Denies blurred vision, double vision. bleeds, sore throat Cvc:  No  dizziness, chest pain. Resp:   Denies cough or sputum production, shortness of breath Gi: Denies swallowing difficulty, stomach pain. Gu:  Denies bladder incontinence, burning urine Ext:   No Joint pain, stiffness. Skin: No skin rash,  hives  Endoc:  No polyuria, polydipsia. Psych: No depression, insomnia. Other:  All other systems were reviewed with the patient and were negative other that what is mentioned in the HPI.   Physical Examination:   VS: BP 132/74 (BP Location: Left Arm, Cuff Size: Normal)   Pulse 88   Resp 16   Ht 6' (1.829 m)   Wt 213 lb (96.6 kg)   SpO2 94%   BMI 28.89 kg/m   General Appearance: No distress  Neuro:without focal findings,  speech normal,  HEENT: PERRLA, EOM intact.  Mallampati 3 Pulmonary: normal breath sounds, No wheezing.  CardiovascularNormal S1,S2.  No m/r/g.   Abdomen: Benign, Soft, non-tender. Renal:  No costovertebral tenderness  GU:  No performed at this time. Endoc: No evident thyromegaly, no signs of acromegaly. Skin:   warm, no rashes, no ecchymosis  Extremities: normal, no cyanosis, clubbing.  Other findings:    LABORATORY PANEL:   CBC No results for input(s): WBC, HGB, HCT, PLT in the last 168 hours. ------------------------------------------------------------------------------------------------------------------  Chemistries  No results for input(s): NA, K, CL, CO2, GLUCOSE, BUN, CREATININE, CALCIUM, MG, AST, ALT, ALKPHOS, BILITOT in the last 168 hours.  Invalid input(s): GFRCGP ------------------------------------------------------------------------------------------------------------------  Cardiac Enzymes No results for input(s): TROPONINI in the last 168 hours. ------------------------------------------------------------  RADIOLOGY:  No results found.     Thank  you for the consultation and for allowing [facility] to assist in the care of your patient.  Our recommendations are noted  above.  Please contact us if we can be of further service.   Marda Stalker, MD.  Board Certified in Internal Medicine, Pulmonary Medicine, [place], and Sleep Medicine.  [facility] Office Number: [phone]  Patricia Pesa, M.D.  Merton Border, M.D  05/16/2017

## 2017-05-16 NOTE — Patient Instructions (Signed)
Try to use your cpap every night for the whole night.

## 2017-05-27 ENCOUNTER — Other Ambulatory Visit: Payer: Self-pay | Admitting: Internal Medicine

## 2017-06-11 ENCOUNTER — Telehealth: Payer: Self-pay | Admitting: Internal Medicine

## 2017-06-11 NOTE — Telephone Encounter (Signed)
Copied from [place]. Topic: General - Other >> Jun 11, 2017  9:49 AM [name], NT wrote: Reason for CRM: Pharmacy called and would like  to know if the Dr is aware the patient has tylenol # 3 was filled on 05/24/17  or can they fill the prescription for tramadol  or hold off until tylenol is completed ,at this time the patient is also on clonazepam as as well as duloxetime and trazodone , and sertraline and   please call     [place]  to confirm whether to fill or wait

## 2017-06-11 NOTE — Telephone Encounter (Signed)
Copied from [place]. Topic: General - Other >> Jun 11, 2017  9:49 AM Cecelia Byars, NT wrote: Reason for CRM: Pharmacy called and would like  to know if the Dr is aware the patient has tylenol # 3 was filled on 05/24/17  or can they fill the prescription for tramadol  or hold off until tylenol is completed ,at this time the patient is also on clonazepam as as well as duloxetime and trazodone , and sertraline and   please call     [place]  to confirm whether to fill or wait

## 2017-06-11 NOTE — Telephone Encounter (Signed)
Do not fill the tramadol.  He will need to discuss future refills with the doctor that prescribvedthe tylenol #3

## 2017-06-11 NOTE — Telephone Encounter (Signed)
MyChart message sent to patient .  trmadol dc'd/

## 2017-06-19 ENCOUNTER — Other Ambulatory Visit: Payer: Self-pay | Admitting: Internal Medicine

## 2017-06-20 DIAGNOSIS — R208 Other disturbances of skin sensation: Secondary | ICD-10-CM | POA: Insufficient documentation

## 2017-06-20 DIAGNOSIS — G8929 Other chronic pain: Secondary | ICD-10-CM | POA: Insufficient documentation

## 2017-06-20 NOTE — Telephone Encounter (Signed)
Spoke with pharmacy and they stated that the rx needs prior authorization not refills. Prior authorization has been submitted to covermymeds.

## 2017-06-23 ENCOUNTER — Other Ambulatory Visit: Payer: Self-pay | Admitting: Internal Medicine

## 2017-07-18 ENCOUNTER — Other Ambulatory Visit: Payer: Self-pay | Admitting: Internal Medicine

## 2017-07-23 ENCOUNTER — Ambulatory Visit: Payer: Self-pay | Admitting: Internal Medicine

## 2017-08-06 ENCOUNTER — Other Ambulatory Visit: Payer: Self-pay | Admitting: Internal Medicine

## 2017-08-24 DIAGNOSIS — M722 Plantar fascial fibromatosis: Secondary | ICD-10-CM | POA: Insufficient documentation

## 2017-09-03 ENCOUNTER — Encounter: Payer: Self-pay | Admitting: Podiatry

## 2017-09-03 ENCOUNTER — Ambulatory Visit (INDEPENDENT_AMBULATORY_CARE_PROVIDER_SITE_OTHER): Payer: BLUE CROSS/BLUE SHIELD

## 2017-09-03 ENCOUNTER — Ambulatory Visit: Payer: BLUE CROSS/BLUE SHIELD | Admitting: Podiatry

## 2017-09-03 VITALS — BP 105/60 | HR 67

## 2017-09-03 DIAGNOSIS — M205X2 Other deformities of toe(s) (acquired), left foot: Secondary | ICD-10-CM

## 2017-09-03 DIAGNOSIS — M205X1 Other deformities of toe(s) (acquired), right foot: Secondary | ICD-10-CM

## 2017-09-03 DIAGNOSIS — M79672 Pain in left foot: Secondary | ICD-10-CM | POA: Diagnosis not present

## 2017-09-03 DIAGNOSIS — M2011 Hallux valgus (acquired), right foot: Secondary | ICD-10-CM | POA: Diagnosis not present

## 2017-09-03 DIAGNOSIS — R52 Pain, unspecified: Secondary | ICD-10-CM

## 2017-09-03 DIAGNOSIS — G8929 Other chronic pain: Secondary | ICD-10-CM

## 2017-09-03 MED ORDER — MELOXICAM 15 MG PO TABS: 15.0000 mg | ORAL_TABLET | Freq: Every day | ORAL | 0 refills | Status: DC

## 2017-09-03 NOTE — Progress Notes (Signed)
This patient presents to the office with chief complaint of pain noted in the big toe of the left foot as well as the heel of the left foot.  He says that he works at [employer] which requires significant amount of walking and standing.  He says that after 4 hours. He has difficulty walking due to the pain in his left foot.  He denies any history of trauma or injury to the foot.  No self treatment was afforded. This patient is diabetic and taking gabapentin. Patient presents the office today for an evaluation and treatment of his painful left foot.  General Appearance  Alert, conversant and in no acute stress.  Vascular  Dorsalis pedis and posterior tibial  pulses are palpable  bilaterally.  Capillary return is within normal limits  bilaterally. Temperature is within normal limits  bilaterally.  Neurologic  Senn-Weinstein monofilament wire test within normal limits  bilaterally. Muscle power within normal limits bilaterally.  Nails ormal troponin nails with no evidence of bacterial or fungal infection.  Orthopedic  No limitations of motion of motion feet .  No crepitus or effusions noted.  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  .Limited dorsiflexion noted left first MPJ.  Normal range of motion first MPJ of the right with no crepitus noted.  Skin  normotropic skin with no porokeratosis noted bilaterally.  No signs of infections or ulcers noted.    Hallux limitus 1st MPJ left foot.  Heel pain left foot.    IE  X-rays reveal calcification at the insertion of the plantar fascia right heel.  Examination of the left foot reveals an osteophyte in the first MPJ left foot.  Dorsal swelling noted of the first metatarsal of her left foot.  Patient was told to wear his orthoses which [facility].  Prescribed Mobic to be taken daily.  An intra-articular and a periarticular injection was given in the first MPJ of the left foot.  My goal today was to eliminate his acute pain.   Patient return the office in 2 weeks at which time we will evaluate her progress and schedule orthoses therapy or surgical consideration.     Gardiner Barefoot DPM

## 2017-09-03 NOTE — Patient Instructions (Signed)
Hallux Limitus Hallux limitus is a condition involving pain and a loss of motion of the first (big) toe. The pain gets worse with lifting up (extension) of the toe. This is usually due to arthritic bony bumps (spurring) of the joint at the base of the big toe.  SYMPTOMS   Pain, with lifting up of the toe.  Tenderness over the joint where the big toe meets the foot.  Redness, swelling, and warmth over the top of the base of the big toe (sometimes).  Foot pain, stiffness, and limping. CAUSES  Hallux limitus is caused by arthritis of the joint where the big toe meets the foot. The arthritis creates a bone spur that pinches the soft tissues when the toe is extended. RISK INCREASES WITH:  Tight shoes with a narrow toe box.  Family history of foot problems.  Gout and rheumatoid and psoriatic arthritis.  History of previous toe injury, including "turf toe."  Long first toe, flat feet, and other big toe bony bumps.  Arthritis of the big toe. PREVENTION   Wear wide-toed shoes that fit well.  Tape the big toe to reduce motion and to prevent pinching of the tissues between the bone.  Maintain physical fitness:  Foot and ankle flexibility.  Muscle strength and endurance. PROGNOSIS  This condition can usually be managed with proper treatment. However, surgery is typically required to prevent the problem from recurring.  RELATED COMPLICATIONS  Injury to other areas of the foot or ankle, caused by abnormal walking in an attempt to avoid the pain felt when walking normally. TREATMENT Treatment first involves stopping the activities that aggravate your symptoms. Ice and medicine can be used to reduce the pain and inflammation. Modifications to shoes may help reduce pain, including wearing stiff-soled shoes, shoes with a wide toe box, inserting a padded donut to relieve pressure on top of the joint, or wearing an arch support. Corticosteroid injections may be given to reduce inflammation. If  nonsurgical treatment is unsuccessful, surgery may be needed. Surgical options include removing the arthritic bony spur, cutting a bone in the foot to change the arc of motion (allowing the toe to extend more), or fusion of the joint (eliminating all motion in the joint at the base of the big toe).  MEDICATION   If pain medicine is needed, nonsteroidal anti-inflammatory medicines (aspirin and ibuprofen), or other minor pain relievers (acetaminophen), are often advised.  Do not take pain medicine for 7 days before surgery.  Prescription pain relievers are usually prescribed only after surgery. Use only as directed and only as much as you need.  Ointments for arthritis, applied to the skin, may give some relief.  Injections of corticosteroids may be given to reduce inflammation. HEAT AND COLD  Cold treatment (icing) relieves pain and reduces inflammation. Cold treatment should be applied for 10 to 15 minutes every 2 to 3 hours, and immediately after activity that aggravates your symptoms. Use ice packs or an ice massage.  Heat treatment may be used before performing the stretching and strengthening activities prescribed by your caregiver, physical therapist, or athletic trainer. Use a heat pack or a warm water soak. SEEK MEDICAL CARE IF:   Symptoms get worse or do not improve in 2 weeks, despite treatment.  After surgery you develop fever, increasing pain, redness, swelling, drainage of fluids, bleeding, or increasing warmth.  New, unexplained symptoms develop.    

## 2017-09-10 ENCOUNTER — Other Ambulatory Visit: Payer: Self-pay | Admitting: Podiatry

## 2017-09-10 ENCOUNTER — Ambulatory Visit: Payer: BLUE CROSS/BLUE SHIELD | Admitting: Podiatry

## 2017-09-10 ENCOUNTER — Encounter: Payer: Self-pay | Admitting: Podiatry

## 2017-09-10 DIAGNOSIS — M205X1 Other deformities of toe(s) (acquired), right foot: Secondary | ICD-10-CM

## 2017-09-10 DIAGNOSIS — M2011 Hallux valgus (acquired), right foot: Secondary | ICD-10-CM | POA: Diagnosis not present

## 2017-09-10 DIAGNOSIS — M205X2 Other deformities of toe(s) (acquired), left foot: Secondary | ICD-10-CM | POA: Diagnosis not present

## 2017-09-10 NOTE — Progress Notes (Signed)
This patient presents to the office with continued pain noted in the big toe joint, left foot.  He was diagnosed with a hallux limitus first MPJ one week ago.  He was treated with a periarticular and intra-articular injectio to eliminate his acute pain left foot.  He says the injection lasted 4 days.  He says he has taken the Mobic but is not convinced it is helping.  He shows me his orthoses which she got from [facility] and they were poorly made.  He presents for follow-up for an evaluation of his foot.  General Appearance  Alert, conversant and in no acute stress.  Vascular  Dorsalis pedis and posterior tibial  pulses are palpable  bilaterally.  Capillary return is within normal limits  bilaterally. Temperature is within normal limits  bilaterally.  Neurologic  Senn-Weinstein monofilament wire test within normal limits  bilaterally. Muscle power within normal limits bilaterally.  Nails Normal nails noted with no evidence of bacterial or fungal infection.    Orthopedic  No limitations of motion of motion feet .  No crepitus or effusions noted.  Hallux limitus first MPJ left foot with crepitus.  Dorsomedial as well as dorsal exostosis first metatarsal, left foot.  Skin  normotropic skin with no porokeratosis noted bilaterally.  No signs of infections or ulcers noted.    Hallux limitus 1st MPJ left foot.  ROV.  After discussing this painful foot with this patient , he requests to be evaluated for possible surgical correction.  He will be referred to one of the 2 podiatrist in this office to perform his surgery.   Gardiner Barefoot DPM

## 2017-09-12 ENCOUNTER — Ambulatory Visit (INDEPENDENT_AMBULATORY_CARE_PROVIDER_SITE_OTHER): Payer: BLUE CROSS/BLUE SHIELD

## 2017-09-12 ENCOUNTER — Encounter (INDEPENDENT_AMBULATORY_CARE_PROVIDER_SITE_OTHER): Payer: Self-pay | Admitting: Orthopedic Surgery

## 2017-09-12 ENCOUNTER — Ambulatory Visit (INDEPENDENT_AMBULATORY_CARE_PROVIDER_SITE_OTHER): Payer: BLUE CROSS/BLUE SHIELD | Admitting: Orthopedic Surgery

## 2017-09-12 DIAGNOSIS — G8929 Other chronic pain: Secondary | ICD-10-CM

## 2017-09-12 DIAGNOSIS — M25561 Pain in right knee: Secondary | ICD-10-CM | POA: Diagnosis not present

## 2017-09-12 DIAGNOSIS — M79661 Pain in right lower leg: Secondary | ICD-10-CM | POA: Diagnosis not present

## 2017-09-12 DIAGNOSIS — M5441 Lumbago with sciatica, right side: Secondary | ICD-10-CM

## 2017-09-12 NOTE — Progress Notes (Signed)
Office Visit Note   Patient: [name]           Date of Birth: June 19, 1965           MRN: [id number] Visit Date: 09/12/2017 Requested by: [name], MD [place], [place] [address] PCP: [name], MD  Subjective: Chief Complaint  Patient presents with  . Right Knee - Pain  . Right Leg - Pain  . Lower Back - Pain    HPI: Ottis is a patient with right knee pain of 3 months duration.  Had revision TKA 2016.  He is has a new job now and is on his feet 7 hours a day.  This is a concrete floor.  Localizes pain in the proximal to mid tibial shaft region.  Has low back pain as well.  Since I seen him he had surgery for his right shoulder after a fall.  He was released from that job and now has to work at [employer] which is very much more physically demanding.  Not having any fevers or chills.              ROS: All systems reviewed are negative as they relate to the chief complaint within the history of present illness.  Patient denies  fevers or chills.   Assessment & Plan: Visit Diagnoses:  1. Chronic pain of right knee   2. Acute right-sided low back pain with right-sided sciatica   3. Pain in right lower leg     Plan: Impression is right knee pain with fairly reasonable looking radiographs.  Potentially slight lucency at the inferior aspect of the tibial plateau portion of the revision implant.  No lucency distally.  No evidence of stress reaction or stress fracture.  Plan at this time is L-spine MRI to evaluate for potential source of right-sided radiculopathy.  Follow-up with me after that study.  He may need to not work at [employer] in order to preserve the life of his knee.  Follow-Up Instructions: Return for after MRI.   Orders:  Orders Placed This Encounter  Procedures  . XR Knee 1-2 Views Right  . XR Lumbar Spine 2-3 Views  . XR Tibia/Fibula Right  . MR Lumbar Spine w/o contrast   No orders of the defined types were placed in this  encounter.     Procedures: No procedures performed   Clinical Data: No additional findings.  Objective: Vital Signs: There were no vitals taken for this visit.  Physical Exam:   Constitutional: Patient appears well-developed HEENT:  Head: Normocephalic Eyes:EOM are normal Neck: Normal range of motion Cardiovascular: Normal rate Pulmonary/chest: Effort normal Neurologic: Patient is alert Skin: Skin is warm Psychiatric: Patient has normal mood and affect    Ortho Exam: Ortho exam demonstrates excellent right knee range of motion with trace effusion but no warmth.  No real focal tenderness or swelling in the tibial shaft region.  No groin pain or nerve root tension signs on either leg.  Pedal pulses palpable.  No other masses lymphadenopathy or skin changes noted in that right knee region.  Specialty Comments:  No specialty comments available.  Imaging: Xr Knee 1-2 Views Right  Result Date: 09/12/2017  AP lateral right revision knee prosthesis visualized.  No acute complicating features.  No definite evidence of loosening of the tibial or femoral component.  No evidence of stress reaction or stress fracture within the components.  There is slight lucency noted on the AP  on the undersurface of the implant.  On the tibial side.   Xr Lumbar Spine 2-3 Views  Result Date: 09/12/2017 AP lateral lumbar spine reviewed.  Minimal degenerative disc space disease is present.  There is some lumbar facet disease in the lower lumbar spine.  Overall alignment intact.  There are endplate abnormalities at L2 and L3.  Some calcification and then the aorta also noted.   Xr Tibia/fibula Right  Result Date: 09/12/2017  AP lateral tib-fib reviewed.  No evidence of stress fracture noted in the tibial shaft.  Alignment of the prosthesis on the tibial side intact.     PMFS History: Patient Active Problem List   Diagnosis Date Noted  . Pancreatitis, alcoholic, acute 16/01/9603  . Angioedema of  lips 11/26/2016  . Cognitive complaints with normal neuropsychological exam 10/23/2016  . 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 Hypothyroidism due to acquired atrophy of thyroid 09/14/2015  . Arthritis of knee 06/23/2014  . Enlarged RV (right ventricle) 06/17/2014  . Cervical spine degeneration 02/17/2014  . Overweight 10/29/2013  . Chronic knee pain 10/29/2013  . Diabetes mellitus type 2 in obese ([place]) 10/23/2012  . Seborrheic dermatitis, unspecified 05/17/2012  . Fatty liver 04/22/2012  . Polyarthralgia 12/24/2010  . Sciatica of right side 12/24/2010  . Hyperlipidemia associated with type 2 diabetes mellitus ([place]) 01/13/2009  . Anxiety state 01/13/2009  . Essential hypertension 01/13/2009  . OSA (obstructive sleep apnea) 01/13/2009   Past Medical History:  Diagnosis Date  . Anxiety   . Anxiety and depression   . Bruit    L  . Chest pain    hx  . Diabetes mellitus without complication ([place])    "borderline"  . Edema   . Fatty liver   . GERD (gastroesophageal reflux disease)    uses Omeprazole  . Goiter   . HLD (hyperlipidemia)   . Hypertension    essential, benign  . Hypothyroidism   . Impotence of organic origin   . Murmur   . Other chest pain    tightness, pressure  . Palpitation    hx  . Precordial pain   . Sleep apnea    uses cpap    Family History  Problem Relation Age of Onset  . Heart disease Father     Past Surgical History:  Procedure Laterality Date  . APPENDECTOMY  2002   done at [facility]  . CARDIAC CATHETERIZATION    . CARDIAC CATHETERIZATION N/A 08/27/2015   Procedure: Left Heart Cath and Coronary Angiography;  Surgeon: Jolaine Artist, MD;  Location: [facility];  Service: Cardiovascular;  Laterality: N/A;  . COLONOSCOPY    . HERNIA REPAIR  5-40-98   umbilical  . JOINT REPLACEMENT     right knee  . REVISION TOTAL  KNEE ARTHROPLASTY Right 06/23/2014   DR Marlou Sa  . TONSILLECTOMY    . TOTAL KNEE ARTHROPLASTY Right 2011   right  . TOTAL KNEE REVISION Right 06/23/2014   Procedure: TOTAL KNEE REVISION;  Surgeon: Meredith Pel, MD;  Location: [facility];  Service: Orthopedics;  Laterality: Right;   Social History   Occupational History  . Occupation: IT trainer  . Occupation: SERVICE DIRECTOR    Employer: [employer]  Tobacco Use  . Smoking status: Former Smoker    Packs/day: 2.00    Years: 18.00  Pack years: 36.00    Types: Cigarettes    Last attempt to quit: 04/24/2005    Years since quitting: 12.3  . Smokeless tobacco: Never Used  . Tobacco comment: quit 20 years ago   Substance and Sexual Activity  . Alcohol use: Yes    Alcohol/week: 10.2 oz    Types: 3 Shots of liquor, 14 Standard drinks or equivalent per week    Comment: 1 a day  . Drug use: No  . Sexual activity: Yes

## 2017-09-14 ENCOUNTER — Ambulatory Visit (INDEPENDENT_AMBULATORY_CARE_PROVIDER_SITE_OTHER): Payer: BLUE CROSS/BLUE SHIELD | Admitting: Podiatry

## 2017-09-14 ENCOUNTER — Encounter: Payer: Self-pay | Admitting: Podiatry

## 2017-09-14 DIAGNOSIS — M205X2 Other deformities of toe(s) (acquired), left foot: Secondary | ICD-10-CM

## 2017-09-14 NOTE — Patient Instructions (Signed)
Pre-Operative Instructions  Congratulations, you have decided to take an important step towards improving your quality of life.  You can be assured that the doctors and staff at [facility] will be with you every step of the way.  Here are some important things you should know:  1. Plan to be at the surgery center/hospital at least 1 (one) hour prior to your scheduled time, unless otherwise directed by the surgical center/hospital staff.  You must have a responsible adult accompany you, remain during the surgery and drive you home.  Make sure you have directions to the surgical center/hospital to ensure you arrive on time. 2. If you are having surgery at [facility] or [facility] hospitals, you will need a copy of your medical history and physical form from your family physician within one month prior to the date of surgery. We will give you a form for your primary physician to complete.  3. We make every effort to accommodate the date you request for surgery.  However, there are times where surgery dates or times have to be moved.  We will contact you as soon as possible if a change in schedule is required.   4. No aspirin/ibuprofen for one week before surgery.  If you are on aspirin, any non-steroidal anti-inflammatory medications (Mobic, Aleve, Ibuprofen) should not be taken seven (7) days prior to your surgery.  You make take Tylenol for pain prior to surgery.  5. Medications - If you are taking daily heart and blood pressure medications, seizure, reflux, allergy, asthma, anxiety, pain or diabetes medications, make sure you notify the surgery center/hospital before the day of surgery so they can tell you which medications you should take or avoid the day of surgery. 6. No food or drink after midnight the night before surgery unless directed otherwise by surgical center/hospital staff. 7. No alcoholic beverages 24-hours prior to surgery.  No smoking 24-hours prior or 24-hours after  surgery. 8. Wear loose pants or shorts. They should be loose enough to fit over bandages, boots, and casts. 9. Don't wear slip-on shoes. Sneakers are preferred. 10. Bring your boot with you to the surgery center/hospital.  Also bring crutches or a walker if your physician has prescribed it for you.  If you do not have this equipment, it will be provided for you after surgery. 11. If you have not been contacted by the surgery center/hospital by the day before your surgery, call to confirm the date and time of your surgery. 12. Leave-time from work may vary depending on the type of surgery you have.  Appropriate arrangements should be made prior to surgery with your employer. 13. Prescriptions will be provided immediately following surgery by your doctor.  Fill these as soon as possible after surgery and take the medication as directed. Pain medications will not be refilled on weekends and must be approved by the doctor. 14. Remove nail polish on the operative foot and avoid getting pedicures prior to surgery. 15. Wash the night before surgery.  The night before surgery wash the foot and leg well with water and the antibacterial soap provided. Be sure to pay special attention to beneath the toenails and in between the toes.  Wash for at least three (3) minutes. Rinse thoroughly with water and dry well with a towel.  Perform this wash unless told not to do so by your physician.  Enclosed: 1 Ice pack (please put in freezer the night before surgery)   1 Hibiclens skin cleaner     Pre-op instructions  If you have any questions regarding the instructions, please do not hesitate to call our office.  [place]: [address], [place], [place] [address] -- [phone]  [place]: [address] [place], [place] [address] -- [phone]  [place]: [address]  [place], [place] [address] -- [phone]  [place]: [address], [address], [place], [place] [address] -- [phone]  Website:  [email or web address] 

## 2017-09-17 NOTE — Progress Notes (Signed)
   HPI: 52 year old male presenting today with a chief complaint of pain to the 1st MPJ of the left foot. He is interested in surgical intervention at this time. He was referred by Dr. Prudence Davidson. Patient is here for further evaluation and treatment.   Past Medical History:  Diagnosis Date  . Anxiety   . Anxiety and depression   . Bruit    L  . Chest pain    hx  . Diabetes mellitus without complication ([place])    "borderline"  . Edema   . Fatty liver   . GERD (gastroesophageal reflux disease)    uses Omeprazole  . Goiter   . HLD (hyperlipidemia)   . Hypertension    essential, benign  . Hypothyroidism   . Impotence of organic origin   . Murmur   . Other chest pain    tightness, pressure  . Palpitation    hx  . Precordial pain   . Sleep apnea    uses cpap     Physical Exam: General: The patient is alert and oriented x3 in no acute distress.  Dermatology: Skin is warm, dry and supple bilateral lower extremities. Negative for open lesions or macerations.  Vascular: Palpable pedal pulses bilaterally. No edema or erythema noted. Capillary refill within normal limits.  Neurological: Epicritic and protective threshold grossly intact bilaterally.   Musculoskeletal Exam: Pain on palpation with limited range of motion noted to the first MPJ left foot.    Assessment: 1. Hallux limitus left    Plan of Care:  1. Patient evaluated.  2. Today we discussed the conservative versus surgical management of the presenting pathology. The patient opts for surgical management. All possible complications and details of the procedure were explained. All patient questions were answered. No guarantees were expressed or implied. 3. Authorization for surgery was initiated today. Surgery will consist of cheilectomy with cartiva implant.  4. Post op shoe dispensed.  5. Return to clinic one week post op.      Edrick Kins, DPM [facility]  Dr. Edrick Kins, DPM    [address]  [place], [place] [address]                Office [phone]  Fax [phone]

## 2017-09-19 ENCOUNTER — Other Ambulatory Visit: Payer: BLUE CROSS/BLUE SHIELD | Admitting: Orthotics

## 2017-09-23 DIAGNOSIS — Z5321 Procedure and treatment not carried out due to patient leaving prior to being seen by health care provider: Secondary | ICD-10-CM | POA: Insufficient documentation

## 2017-09-23 DIAGNOSIS — R200 Anesthesia of skin: Secondary | ICD-10-CM | POA: Diagnosis present

## 2017-09-24 ENCOUNTER — Telehealth: Payer: Self-pay | Admitting: Emergency Medicine

## 2017-09-24 ENCOUNTER — Other Ambulatory Visit: Payer: Self-pay

## 2017-09-24 ENCOUNTER — Emergency Department: Payer: BLUE CROSS/BLUE SHIELD

## 2017-09-24 ENCOUNTER — Emergency Department
Admission: EM | Admit: 2017-09-24 | Discharge: 2017-09-24 | Disposition: A | Discharge status: Home / Self Care | Payer: BLUE CROSS/BLUE SHIELD | Attending: Emergency Medicine | Admitting: Emergency Medicine

## 2017-09-24 LAB — BASIC METABOLIC PANEL
ANION GAP: 10 (ref 5–15)
BUN: 15 mg/dL (ref 6–20)
CALCIUM: 9 mg/dL (ref 8.9–10.3)
CHLORIDE: 104 mmol/L (ref 101–111)
CO2: 23 mmol/L (ref 22–32)
Creatinine, Ser: 0.82 mg/dL (ref 0.61–1.24)
GFR calc non Af Amer: >60 mL/min (ref >60)
Glucose, Bld: 134 mg/dL — ABNORMAL HIGH (ref 65–99)
POTASSIUM: 3.2 mmol/L — AB (ref 3.5–5.1)
Sodium: 137 mmol/L (ref 135–145)

## 2017-09-24 LAB — CBC WITH DIFFERENTIAL/PLATELET
BASOS ABS: 0 10*3/uL (ref 0–0.1)
BASOS PCT: 1 %
Eosinophils Absolute: 0.3 10*3/uL (ref 0–0.7)
Eosinophils Relative: 6 %
HEMATOCRIT: 40.6 % (ref 40.0–52.0)
HEMOGLOBIN: 14.2 g/dL (ref 13.0–18.0)
LYMPHS PCT: 19 %
Lymphs Abs: 0.8 10*3/uL — ABNORMAL LOW (ref 1.0–3.6)
MCH: 32 pg (ref 26.0–34.0)
MCHC: 35 g/dL (ref 32.0–36.0)
MCV: 91.4 fL (ref 80.0–100.0)
Monocytes Absolute: 0.4 10*3/uL (ref 0.2–1.0)
Monocytes Relative: 9 %
NEUTROS ABS: 2.7 10*3/uL (ref 1.4–6.5)
NEUTROS PCT: 65 %
Platelets: 248 10*3/uL (ref 150–440)
RBC: 4.44 MIL/uL (ref 4.40–5.90)
RDW: 13 % (ref 11.5–14.5)
WBC: 4.1 10*3/uL (ref 3.8–10.6)

## 2017-09-24 NOTE — ED Notes (Signed)
Rounding in [place], pt is not present; checked outside front door and pt is not outside either;

## 2017-09-24 NOTE — ED Triage Notes (Signed)
Patient reports symptoms began around 4 pm with right sided headache (reports history of migraines and this feels similar though no migraine in years).  Also reports left arm feels numb.

## 2017-09-24 NOTE — Telephone Encounter (Signed)
Called patient due to lwot to inquire about condition and follow up plans. Left message.   

## 2017-09-24 NOTE — ED Notes (Signed)
Called for pt in waiting room to recheck vital signs; pt is not present; checked men's bathroom and pt is not in there either

## 2017-09-30 ENCOUNTER — Ambulatory Visit
Admission: RE | Admit: 2017-09-30 | Discharge: 2017-09-30 | Disposition: A | Discharge status: Home / Self Care | Payer: BLUE CROSS/BLUE SHIELD | Source: Ambulatory Visit | Attending: Orthopedic Surgery | Admitting: Orthopedic Surgery

## 2017-09-30 DIAGNOSIS — M5441 Lumbago with sciatica, right side: Secondary | ICD-10-CM

## 2017-10-01 ENCOUNTER — Ambulatory Visit (INDEPENDENT_AMBULATORY_CARE_PROVIDER_SITE_OTHER): Payer: BLUE CROSS/BLUE SHIELD | Admitting: Orthopedic Surgery

## 2017-10-01 ENCOUNTER — Encounter (INDEPENDENT_AMBULATORY_CARE_PROVIDER_SITE_OTHER): Payer: Self-pay | Admitting: Orthopedic Surgery

## 2017-10-01 ENCOUNTER — Telehealth (INDEPENDENT_AMBULATORY_CARE_PROVIDER_SITE_OTHER): Payer: Self-pay

## 2017-10-01 DIAGNOSIS — G8929 Other chronic pain: Secondary | ICD-10-CM

## 2017-10-01 DIAGNOSIS — M25561 Pain in right knee: Secondary | ICD-10-CM | POA: Diagnosis not present

## 2017-10-01 NOTE — Telephone Encounter (Signed)
Sched pt appt July 1st @ 4:00pm

## 2017-10-01 NOTE — Telephone Encounter (Signed)
Could you please call patient and schedule him an appointment with Dr Sharol Given (per Dr Forbes Cellar request) for his left toe cheilectomy. Thanks

## 2017-10-01 NOTE — Progress Notes (Signed)
Office Visit Note   Patient: [name]           Date of Birth: 06-11-1965           MRN: [id number] Visit Date: 10/01/2017 Requested by: [name], MD [address] [place], [place] [address] PCP: [name], MD  Subjective: Chief Complaint  Patient presents with  . Right Knee - Pain    HPI: Rein is a patient with right leg pain.  MRI of L-spine was obtained which shows some left-sided L3-4 disc issues but nothing on the right-hand side.  He localizes pain to the lateral side below the tibial tubercle.  He also has left great toe MTP arthritis and has been evaluated by foot doctors in the community for surgical management.  He would like to have another opinion about that.  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  He has not had any fevers or chills.              ROS: All systems reviewed are negative as they relate to the chief complaint within the history of present illness.  Patient denies  fevers or chills.   Assessment & Plan: Visit Diagnoses:  1. Chronic pain of right knee     Plan: Impression is right knee pain with some focal pain below the tibial tubercle.  The stem extends about 8 or 9 cm below the tibial tubercle.  Plan on this stem and right knee is that it does not appear to be coming from his back.  Draw CBC differential sed rate C-reactive protein today and if those are elevated I would favor aspiration of a mild effusion that in his right knee.  Regarding the bone on bone arthritis in his toe he needs to consider either cheilectomy fusion or joint replacement.  I would like him to follow-up with Dr. Sharol Given for further discussion on those matters.  He will try to get his radiographs from the foot doctor but if that is not possible more may need to be obtained.  Follow-Up Instructions: No follow-ups on file.   Orders:  Orders Placed This Encounter  Procedures  . CBC with Differential  . Sed  Rate (ESR)  . C-reactive protein   No orders of the defined types were placed in this encounter.     Procedures: No procedures performed   Clinical Data: No additional findings.  Objective: Vital Signs: There were no vitals taken for this visit.  Physical Exam:   Constitutional: Patient appears well-developed HEENT:  Head: Normocephalic Eyes:EOM are normal Neck: Normal range of motion Cardiovascular: Normal rate Pulmonary/chest: Effort normal Neurologic: Patient is alert Skin: Skin is warm Psychiatric: Patient has normal mood and affect    Ortho Exam: Ortho examination does show diminished range of motion of the MTP joint of the toe.  There are some bone spurs present dorsally.  EHL function is intact.  Examination of the right knee demonstrates excellent range of motion full extension to about 110 of flexion.  Mild effusion is present.  No focal tenderness below the stem tip.  He does have a little bit of tenderness about 2 fingerbreadths below the lateral joint line.  Extensor mechanism is intact.  Specialty Comments:  No specialty comments available.  Imaging: No results found.   PMFS History: Patient Active Problem List   Diagnosis Date Noted  . Pancreatitis, alcoholic, acute 00/71/2197  . Angioedema of lips  11/26/2016  . Cognitive complaints with normal neuropsychological exam 10/23/2016  . 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 Hypothyroidism due to acquired atrophy of thyroid 09/14/2015  . Arthritis of knee 06/23/2014  . Enlarged RV (right ventricle) 06/17/2014  . Cervical spine degeneration 02/17/2014  . Overweight 10/29/2013  . Chronic knee pain 10/29/2013  . Diabetes mellitus type 2 in obese ([place]) 10/23/2012  . Seborrheic dermatitis, unspecified 05/17/2012  . Fatty liver 04/22/2012  . Polyarthralgia 12/24/2010  . Sciatica of  right side 12/24/2010  . Hyperlipidemia associated with type 2 diabetes mellitus ([place]) 01/13/2009  . Anxiety state 01/13/2009  . Essential hypertension 01/13/2009  . OSA (obstructive sleep apnea) 01/13/2009   Past Medical History:  Diagnosis Date  . Anxiety   . Anxiety and depression   . Bruit    L  . Chest pain    hx  . Diabetes mellitus without complication ([place])    "borderline"  . Edema   . Fatty liver   . GERD (gastroesophageal reflux disease)    uses Omeprazole  . Goiter   . HLD (hyperlipidemia)   . Hypertension    essential, benign  . Hypothyroidism   . Impotence of organic origin   . Murmur   . Other chest pain    tightness, pressure  . Palpitation    hx  . Precordial pain   . Sleep apnea    uses cpap    Family History  Problem Relation Age of Onset  . Heart disease Father     Past Surgical History:  Procedure Laterality Date  . APPENDECTOMY  2002   done at [facility]  . CARDIAC CATHETERIZATION    . CARDIAC CATHETERIZATION N/A 08/27/2015   Procedure: Left Heart Cath and Coronary Angiography;  Surgeon: Jolaine Artist, MD;  Location: [facility];  Service: Cardiovascular;  Laterality: N/A;  . COLONOSCOPY    . HERNIA REPAIR  0-35-46   umbilical  . JOINT REPLACEMENT     right knee  . REVISION TOTAL KNEE ARTHROPLASTY Right 06/23/2014   DR Marlou Sa  . TONSILLECTOMY    . TOTAL KNEE ARTHROPLASTY Right 2011   right  . TOTAL KNEE REVISION Right 06/23/2014   Procedure: TOTAL KNEE REVISION;  Surgeon: Meredith Pel, MD;  Location: [facility];  Service: Orthopedics;  Laterality: Right;   Social History   Occupational History  . Occupation: IT trainer  . Occupation: SERVICE DIRECTOR    Employer: [employer]  Tobacco Use  . Smoking status: Former Smoker    Packs/day: 2.00    Years: 18.00    Pack years: 36.00    Types: Cigarettes    Last attempt to quit: 04/24/2005    Years since quitting: 12.4  .  Smokeless tobacco: Never Used  . Tobacco comment: quit 20 years ago   Substance and Sexual Activity  . Alcohol use: Yes    Alcohol/week: 10.2 oz    Types: 3 Shots of liquor, 14 Standard drinks or equivalent per week    Comment: 1 a day  . Drug use: No  . Sexual activity: Yes

## 2017-10-02 LAB — CBC WITH DIFFERENTIAL/PLATELET
BASOS PCT: 0.6 %
Basophils Absolute: 32 cells/uL (ref 0–200)
EOS ABS: 243 {cells}/uL (ref 15–500)
EOS PCT: 4.5 %
HCT: 40.4 % (ref 38.5–50.0)
HEMOGLOBIN: 14.2 g/dL (ref 13.2–17.1)
Lymphs Abs: 740 cells/uL — ABNORMAL LOW (ref 850–3900)
MCH: 31.5 pg (ref 27.0–33.0)
MCHC: 35.1 g/dL (ref 32.0–36.0)
MCV: 89.6 fL (ref 80.0–100.0)
MONOS PCT: 9.8 %
MPV: 10.2 fL (ref 7.5–12.5)
Neutro Abs: 3856 cells/uL (ref 1500–7800)
Neutrophils Relative %: 71.4 %
PLATELETS: 254 10*3/uL (ref 140–400)
RBC: 4.51 10*6/uL (ref 4.20–5.80)
RDW: 12.7 % (ref 11.0–15.0)
TOTAL LYMPHOCYTE: 13.7 %
WBC mixed population: 529 cells/uL (ref 200–950)
WBC: 5.4 10*3/uL (ref 3.8–10.8)

## 2017-10-02 LAB — SEDIMENTATION RATE: SED RATE: 2 mm/h (ref 0–20)

## 2017-10-02 LAB — C-REACTIVE PROTEIN: CRP: 4.1 mg/L (ref <8.0)

## 2017-10-02 NOTE — Progress Notes (Signed)
Please call patient with results. Thanks lab work shows no infection

## 2017-10-05 ENCOUNTER — Other Ambulatory Visit: Payer: Self-pay | Admitting: Internal Medicine

## 2017-10-16 ENCOUNTER — Encounter: Payer: Self-pay | Admitting: Internal Medicine

## 2017-10-16 ENCOUNTER — Ambulatory Visit (INDEPENDENT_AMBULATORY_CARE_PROVIDER_SITE_OTHER): Payer: BLUE CROSS/BLUE SHIELD | Admitting: Internal Medicine

## 2017-10-16 VITALS — BP 132/82 | HR 75 | Temp 98.5°F | Resp 15 | Ht 72.0 in | Wt 211.8 lb

## 2017-10-16 DIAGNOSIS — Z113 Encounter for screening for infections with a predominantly sexual mode of transmission: Secondary | ICD-10-CM | POA: Diagnosis not present

## 2017-10-16 DIAGNOSIS — E785 Hyperlipidemia, unspecified: Secondary | ICD-10-CM | POA: Diagnosis not present

## 2017-10-16 DIAGNOSIS — Z125 Encounter for screening for malignant neoplasm of prostate: Secondary | ICD-10-CM | POA: Diagnosis not present

## 2017-10-16 DIAGNOSIS — Z0000 Encounter for general adult medical examination without abnormal findings: Secondary | ICD-10-CM | POA: Diagnosis not present

## 2017-10-16 DIAGNOSIS — E034 Atrophy of thyroid (acquired): Secondary | ICD-10-CM

## 2017-10-16 DIAGNOSIS — Z23 Encounter for immunization: Secondary | ICD-10-CM

## 2017-10-16 DIAGNOSIS — E1169 Type 2 diabetes mellitus with other specified complication: Secondary | ICD-10-CM | POA: Diagnosis not present

## 2017-10-16 DIAGNOSIS — K760 Fatty (change of) liver, not elsewhere classified: Secondary | ICD-10-CM | POA: Diagnosis not present

## 2017-10-16 DIAGNOSIS — E669 Obesity, unspecified: Secondary | ICD-10-CM | POA: Diagnosis not present

## 2017-10-16 DIAGNOSIS — F0632 Mood disorder due to known physiological condition with major depressive-like episode: Secondary | ICD-10-CM

## 2017-10-16 DIAGNOSIS — I10 Essential (primary) hypertension: Secondary | ICD-10-CM | POA: Diagnosis not present

## 2017-10-16 LAB — COMPREHENSIVE METABOLIC PANEL
ALBUMIN: 4.6 g/dL (ref 3.5–5.2)
ALK PHOS: 98 U/L (ref 39–117)
ALT: 21 U/L (ref 0–53)
AST: 21 U/L (ref 0–37)
BILIRUBIN TOTAL: 0.4 mg/dL (ref 0.2–1.2)
BUN: 27 mg/dL — AB (ref 6–23)
CO2: 27 mEq/L (ref 19–32)
CREATININE: 0.89 mg/dL (ref 0.40–1.50)
Calcium: 9.3 mg/dL (ref 8.4–10.5)
Chloride: 99 mEq/L (ref 96–112)
GFR: 95.06 mL/min (ref >60.00)
Glucose, Bld: 105 mg/dL — ABNORMAL HIGH (ref 70–99)
POTASSIUM: 3.9 meq/L (ref 3.5–5.1)
SODIUM: 137 meq/L (ref 135–145)
TOTAL PROTEIN: 7 g/dL (ref 6.0–8.3)

## 2017-10-16 LAB — LIPID PANEL
Cholesterol: 164 mg/dL (ref 0–200)
HDL: 44.6 mg/dL (ref >39.00)
NONHDL: 119.71
Total CHOL/HDL Ratio: 4
Triglycerides: 221 mg/dL — ABNORMAL HIGH (ref 0.0–149.0)
VLDL: 44.2 mg/dL — ABNORMAL HIGH (ref 0.0–40.0)

## 2017-10-16 LAB — PSA: PSA: 0.7 ng/mL (ref 0.10–4.00)

## 2017-10-16 LAB — MICROALBUMIN / CREATININE URINE RATIO
CREATININE, U: 106.3 mg/dL
MICROALB/CREAT RATIO: 0.8 mg/g (ref 0.0–30.0)
Microalb, Ur: 0.8 mg/dL (ref 0.0–1.9)

## 2017-10-16 LAB — LDL CHOLESTEROL, DIRECT: Direct LDL: 101 mg/dL

## 2017-10-16 LAB — TSH: TSH: 2.24 u[IU]/mL (ref 0.35–4.50)

## 2017-10-16 MED ORDER — CLONAZEPAM 0.5 MG PO TABS: 0.5000 mg | ORAL_TABLET | Freq: Two times a day (BID) | ORAL | 5 refills | Status: DC

## 2017-10-16 MED ORDER — AMOXICILLIN 500 MG PO CAPS: ORAL_CAPSULE | ORAL | 1 refills | Status: DC

## 2017-10-16 MED ORDER — PROMETHAZINE HCL 12.5 MG PO TABS: 12.5000 mg | ORAL_TABLET | Freq: Three times a day (TID) | ORAL | 0 refills | Status: DC | PRN

## 2017-10-16 NOTE — Patient Instructions (Addendum)
You received your TdaP vaccine today  Your labs today include screening for HIV and prostate Cancer   I want you to reduce your clonazepam to 1/2 tablet in the morning and a full tablet  In the evening   After 2 weeks,  Reduce the evening dose to 1/2 tablet as well   If you develop any symptoms  of withdrawal,,  Resume prior dose  And let me know   Referral to dr Vira Agar for colonoscopy  Choose an eye doctor and get a diabetic eye exam ASP   congrats on the weight loss !  You look great!  You are well on your way to your goal.    Health Maintenance, Male A healthy lifestyle and preventive care is important for your health and wellness. Ask your health care provider about what schedule of regular examinations is right for you. What should I know about weight and diet? Eat a Healthy Diet  Eat plenty of vegetables, fruits, whole grains, low-fat dairy products, and lean protein.  Do not eat a lot of foods high in solid fats, added sugars, or salt.  Maintain a Healthy Weight Regular exercise can help you achieve or maintain a healthy weight. You should:  Do at least 150 minutes of exercise each week. The exercise should increase your heart rate and make you sweat (moderate-intensity exercise).  Do strength-training exercises at least twice a week.  Watch Your Levels of Cholesterol and Blood Lipids  Have your blood tested for lipids and cholesterol every 5 years starting at 53 years of age. If you are at high risk for heart disease, you should start having your blood tested when you are 53 years old. You may need to have your cholesterol levels checked more often if: ? Your lipid or cholesterol levels are high. ? You are older than 53 years of age. ? You are at high risk for heart disease.  What should I know about cancer screening? Many types of cancers can be detected early and may often be prevented. Lung Cancer  You should be screened every year for lung cancer if: ? You  are a current smoker who has smoked for at least 30 years. ? You are a former smoker who has quit within the past 15 years.  Talk to your health care provider about your screening options, when you should start screening, and how often you should be screened.  Colorectal Cancer  Routine colorectal cancer screening usually begins at 53 years of age and should be repeated every 5-10 years until you are 53 years old. You may need to be screened more often if early forms of precancerous polyps or small growths are found. Your health care provider may recommend screening at an earlier age if you have risk factors for colon cancer.  Your health care provider may recommend using home test kits to check for hidden blood in the stool.  A small camera at the end of a tube can be used to examine your colon (sigmoidoscopy or colonoscopy). This checks for the earliest forms of colorectal cancer.  Prostate and Testicular Cancer  Depending on your age and overall health, your health care provider may do certain tests to screen for prostate and testicular cancer.  Talk to your health care provider about any symptoms or concerns you have about testicular or prostate cancer.  Skin Cancer  Check your skin from head to toe regularly.  Tell your health care provider about any new moles or changes  in moles, especially if: ? There is a change in a mole's size, shape, or color. ? You have a mole that is larger than a pencil eraser.  Always use sunscreen. Apply sunscreen liberally and repeat throughout the day.  Protect yourself by wearing long sleeves, pants, a wide-brimmed hat, and sunglasses when outside.  What should I know about heart disease, diabetes, and high blood pressure?  If you are 22-11 years of age, have your blood pressure checked every 3-5 years. If you are 63 years of age or older, have your blood pressure checked every year. You should have your blood pressure measured twice-once when you  are at a hospital or clinic, and once when you are not at a hospital or clinic. Record the average of the two measurements. To check your blood pressure when you are not at a hospital or clinic, you can use: ? An automated blood pressure machine at a pharmacy. ? A home blood pressure monitor.  Talk to your health care provider about your target blood pressure.  If you are between 68-15 years old, ask your health care provider if you should take aspirin to prevent heart disease.  Have regular diabetes screenings by checking your fasting blood sugar level. ? If you are at a normal weight and have a low risk for diabetes, have this test once every three years after the age of 76. ? If you are overweight and have a high risk for diabetes, consider being tested at a younger age or more often.  A one-time screening for abdominal aortic aneurysm (AAA) by ultrasound is recommended for men aged 22-75 years who are current or former smokers. What should I know about preventing infection? Hepatitis B If you have a higher risk for hepatitis B, you should be screened for this virus. Talk with your health care provider to find out if you are at risk for hepatitis B infection. Hepatitis C Blood testing is recommended for:  Everyone born from [birth year] through 1966.  Anyone with known risk factors for hepatitis C.  Sexually Transmitted Diseases (STDs)  You should be screened each year for STDs including gonorrhea and chlamydia if: ? You are sexually active and are younger than 52 years of age. ? You are older than 53 years of age and your health care provider tells you that you are at risk for this type of infection. ? Your sexual activity has changed since you were last screened and you are at an increased risk for chlamydia or gonorrhea. Ask your health care provider if you are at risk.  Talk with your health care provider about whether you are at high risk of being infected with HIV. Your health care  provider may recommend a prescription medicine to help prevent HIV infection.  What else can I do?  Schedule regular health, dental, and eye exams.  Stay current with your vaccines (immunizations).  Do not use any tobacco products, such as cigarettes, chewing tobacco, and e-cigarettes. If you need help quitting, ask your health care provider.  Limit alcohol intake to no more than 2 drinks per day. One drink equals 12 ounces of beer, 5 ounces of wine, or 1 ounces of hard liquor.  Do not use street drugs.  Do not share needles.  Ask your health care provider for help if you need support or information about quitting drugs.  Tell your health care provider if you often feel depressed.  Tell your health care provider if you have ever been  abused or do not feel safe at home. This information is not intended to replace advice given to you by your health care provider. Make sure you discuss any questions you have with your health care provider. Document Released: 10/07/2007 Document Revised: 12/08/2015 Document Reviewed: 01/12/2015 Elsevier Interactive Patient Education  [employer].

## 2017-10-16 NOTE — Progress Notes (Signed)
Patient ID: [name], male    DOB: 06/23/1965  Age: 52 y.o. MRN: [id number]  The patient is here for annual  wellness examination and management of other chronic and acute problems.  No eye exam in over a year .   Needs Tetanus vaccine History of colonoscopy at age 39  by [facility].  Need a colonoscopy to be done in [facility]  Lab Results  Component Value Date   PSA 0.70 10/16/2017   PSA 0.65 03/30/2015   PSA 0.75 10/17/2011     The risk factors are reflected in the social history.  The roster of all physicians providing medical care to patient - is listed in the Snapshot section of the chart.  Activities of daily living:  The patient is 100% independent in all ADLs: dressing, toileting, feeding as well as independent mobility  Home safety : The patient has smoke detectors in the home. They wear seatbelts.  There are no firearms at home. There is no violence in the home.   There is no risks for hepatitis, STDs or HIV. There is no   history of blood transfusion. They have no travel history to infectious disease endemic areas of the world.  The patient has seen their dentist in the last six month. They have seen their eye doctor in the last year.   They do not  have excessive sun exposure. Discussed the need for sun protection: hats, long sleeves and use of sunscreen if there is significant sun exposure.   Diet: the importance of a healthy diet is discussed. They do have a healthy diet.  The benefits of regular aerobic exercise were discussed. He is exercising 4 times per week .    Depression screen: there are no signs or vegative symptoms of depression- irritability, change in appetite, anhedonia, sadness/tearfullness.  Cognitive assessment: the patient manages all their financial and personal affairs and is actively engaged. They could relate day,date,year and events; recalled 2/3 objects at 3 minutes; performed clock-face test normally.  The following portions of  the patient's history were reviewed and updated as appropriate: allergies, current medications, past family history, past medical history,  past surgical history, past social history  and problem list.  Visual acuity was not assessed per patient preference since she has regular follow up with her ophthalmologist. Hearing and body mass index were assessed and reviewed.   During the course of the visit the patient was educated and counseled about appropriate screening and preventive services including : fall prevention , diabetes screening, nutrition counseling, colorectal cancer screening, and recommended immunizations.    CC: The primary encounter diagnosis was Diabetes mellitus type 2 in obese ([place]). 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   3 month follow up on diabetes.  Patient has no complaints today.  Patient is following a low glycemic index diet , eating smaller portions and taking all prescribed medications regularly without side effects.  Fasting sugars have been under less than 140 most of the time and post prandials have been under 160 except on rare occasions. Patient is exercising about 3 times per week and intentionally trying to lose weight .  Patient has NOT had an eye exam in  the last 12 months and checks feet regularly for signs of infection.  Patient does not walk barefoot outside,  And denies any numbness tingling or burning in feet. Patient is up to date on all recommended vaccinations  Lab Results  Component Value Date   HGBA1C 6.0 10/23/2016     GAD:  Still  taking klonopin  twice daily  150  mg zoloft daily .  Things going well at home.   History Arieh has a past medical history of Anxiety, Anxiety and depression, Bruit, Chest pain, Diabetes mellitus without complication ([place]), Edema, Fatty liver, GERD (gastroesophageal reflux disease), Goiter, HLD (hyperlipidemia), Hypertension, Hypothyroidism, Impotence of organic origin, Murmur, Other chest pain, Palpitation, Precordial pain, and Sleep apnea.     He has a past surgical history that includes Appendectomy (2002); Total knee arthroplasty (Right, 2011); Hernia repair (05-15-13); Joint replacement; Tonsillectomy; Colonoscopy; Cardiac catheterization; Revision total knee arthroplasty (Right, 06/23/2014); Total knee revision (Right, 06/23/2014); and Cardiac catheterization (N/A, 08/27/2015).   His family history includes Heart disease in his father.He reports that he quit smoking about 12 years ago. His smoking use included cigarettes. He has a 36.00 pack-year smoking history. He has never used smokeless tobacco. He reports that he drinks about 10.2 oz of alcohol per week. He reports that he does not use drugs.  Outpatient Medications Prior to Visit  Medication Sig Dispense Refill  . Acetaminophen-Codeine (TYLENOL/CODEINE #3) 300-30 MG tablet Take 1 tablet by mouth every 8 (eight) hours.    Marland Kitchen aspirin EC 81 MG tablet Take 81 mg by mouth daily.    Marland Kitchen BAYER MICROLET LANCETS lancets Use as instructed 100 each 12  . Clobetasol Propionate 0.05 % shampoo Apply to dry scalp and leave on for 15 minutes 118 mL 5  . dicyclomine (BENTYL) 20 MG tablet TAKE 1 TABLET (20 MG TOTAL) BY MOUTH EVERY 6 (SIX) HOURS. 120 tablet 5  . DULoxetine (CYMBALTA) 60 MG capsule Take 60 mg by mouth daily with breakfast.  0  . gabapentin (NEURONTIN) 600 MG tablet gabapentin 600 mg tablet    . halobetasol (ULTRAVATE) 0.05 % cream Apply topically 2 (two) times daily. 50 g 0  . ibuprofen (ADVIL,MOTRIN) 200 MG tablet Take 400 mg by mouth every  6 (six) hours as needed (pain).    Marland Kitchen levothyroxine (SYNTHROID, LEVOTHROID) 88 MCG tablet TAKE 1 TABLET BY MOUTH EVERY DAY BEFORE BREAKFAST 30 tablet 0  . lisinopril-hydrochlorothiazide (PRINZIDE,ZESTORETIC) 20-25 MG tablet TAKE 1 TABLET BY MOUTH EVERY DAY 30 tablet 5  . meloxicam (MOBIC) 15 MG tablet Take 1 tablet (15 mg total) by mouth daily. 30 tablet 0  . methocarbamol (ROBAXIN) 500 MG tablet TAKE 1 TABLET (500 MG TOTAL) BY MOUTH EVERY 8 (EIGHT) HOURS AS NEEDED. 90 tablet 5  . metoprolol tartrate (LOPRESSOR) 25 MG tablet TAKE 1 TABLET BY MOUTH TWICE A DAY 60 tablet 5  . Multiple Vitamin (MULTIVITAMIN WITH MINERALS) TABS tablet Take 1 tablet by mouth daily.    Marland Kitchen omeprazole (PRILOSEC) 40 MG capsule TAKE 1 CAPSULE (40 MG TOTAL) BY MOUTH 2 (TWO) TIMES DAILY. 60 capsule 5  . ondansetron (ZOFRAN) 4 MG tablet Take 1 tablet (4 mg total) by mouth every 8 (eight) hours as needed for nausea or vomiting. 20 tablet 0  . Pramoxine-HC 1-2 % GEL Apply 1 application topically 2 (two) times daily. 29 g 3  . rosuvastatin (CRESTOR) 20 MG tablet TAKE 1 TABLET (20 MG TOTAL) BY MOUTH DAILY. 90 tablet 2  . sertraline (ZOLOFT) 100  MG tablet Take 1.5 tablets (150 mg total) by mouth daily. 45 tablet 2  . traZODone (DESYREL) 50 MG tablet TAKE 1-2 TABLETS BY MOUTH NIGHTLY AS NEEDED FOR SLEEP  2  . clonazePAM (KLONOPIN) 0.5 MG tablet Take 0.5 mg by mouth 2 (two) times daily.  1  . promethazine (PHENERGAN) 12.5 MG tablet Take 1 tablet (12.5 mg total) by mouth every 8 (eight) hours as needed for nausea or vomiting. 20 tablet 0  . amoxicillin (AMOXIL) 875 MG tablet Take 875 mg by mouth every 12 (twelve) hours.  0  . hydrocortisone (ANUSOL-HC) 2.5 % rectal cream Place 1 application rectally 2 (two) times daily. (Patient not taking: Reported on 10/16/2017) 30 g 0  . naproxen (NAPROSYN) 500 MG tablet Take 1 tablet (500 mg total) by mouth 2 (two) times daily with a meal. (Patient not taking: Reported on 10/16/2017) 20 tablet 00   No  facility-administered medications prior to visit.     Review of Systems  Objective:  BP 132/82 (BP Location: Left Arm, Patient Position: Sitting, Cuff Size: Normal)   Pulse 75   Temp 98.5 F (36.9 C) (Oral)   Resp 15   Ht 6' (1.829 m)   Wt 211 lb 12.8 oz (96.1 kg)   SpO2 96%   BMI 28.73 kg/m   Physical Exam    Assessment & Plan:   Problem List Items Addressed This Visit    Hyperlipidemia associated with type 2 diabetes mellitus ([place])   Relevant Orders   Lipid panel (Completed)   Hypothyroidism due to acquired atrophy of thyroid   Relevant Orders   TSH (Completed)   Essential hypertension    Well controlled on current regimen. Renal function stable, no changes today.  Lab Results  Component Value Date   CREATININE 0.89 10/16/2017   Lab Results  Component Value Date   NA 137 10/16/2017   K 3.9 10/16/2017   CL 99 10/16/2017   CO2 27 10/16/2017         Fatty liver    Liver enzymes normalized with alcohol abstinence and weight loss   Lab Results  Component Value Date   ALT 21 10/16/2017   AST 21 10/16/2017   ALKPHOS 98 10/16/2017   BILITOT 0.4 10/16/2017         Diabetes mellitus type 2 in obese [facility]) - Primary    Diagnosed in Oct 2014 with a1c of 7.8.  Has been  well-controlled on diet alone  .   Patient is reminded to schedule an annual eye exam and foot exam is normal today. Patient has no history of microalbuminuria. Patient is tolerating statin therapy for CAD risk reduction and on ACE/ARB for renal protection and hypertension.  Lab Results  Component Value Date   HGBA1C 6.0 10/23/2016   Lab Results  Component Value Date   MICROALBUR 0.8 10/16/2017   Lab Results  Component Value Date   ALT 21 10/16/2017   AST 21 10/16/2017   ALKPHOS 98 10/16/2017   BILITOT 0.4 10/16/2017                Relevant Orders   Microalbumin / creatinine urine ratio (Completed)   Comprehensive metabolic panel (Completed)   Hemoglobin A1c   Encounter  for preventive health examination    Annual comprehensive preventive exam was done as well as an evaluation and management of acute and chronic conditions .  During the course of the visit the patient was educated and counseled about appropriate screening and  preventive services including :  diabetes screening, lipid analysis with projected  10 year  risk for CAD , nutrition counseling, prostate and colorectal cancer screening, and recommended immunizations.  Printed recommendations for health maintenance screenings was given.   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 No changes today        Other Visit Diagnoses    Screen for STD (sexually transmitted disease)       Relevant Orders   HIV antibody (Completed)   Prostate cancer screening       Relevant Orders   PSA (Completed)   Need for diphtheria-tetanus-pertussis (Tdap) vaccine       Relevant Orders   Tdap vaccine greater than or equal to 7yo IM (Completed)      I have discontinued Gillian Shields. Marciano's hydrocortisone and naproxen. I have also changed his clonazePAM and amoxicillin. Additionally, I am having him maintain his BAYER MICROLET LANCETS, multivitamin with minerals, ibuprofen, aspirin EC, Clobetasol Propionate, halobetasol, ondansetron, Pramoxine-HC, sertraline, rosuvastatin, Acetaminophen-Codeine, DULoxetine, omeprazole, dicyclomine, methocarbamol, metoprolol tartrate, lisinopril-hydrochlorothiazide, meloxicam, gabapentin, amoxicillin, traZODone, levothyroxine, and promethazine.  Meds ordered this encounter  Medications  . clonazePAM (KLONOPIN) 0.5 MG tablet    Sig: Take 1 tablet (0.5 mg total) by mouth 2 (two) times daily.    Dispense:  60 tablet    Refill:  5  . promethazine (PHENERGAN) 12.5 MG tablet     Sig: Take 1 tablet (12.5 mg total) by mouth every 8 (eight) hours as needed for nausea or vomiting.    Dispense:  20 tablet    Refill:  0  . amoxicillin (AMOXIL) 500 MG capsule    Sig: Take 4 capsules prior to dental procedures.    Dispense:  4 capsule    Refill:  1    Medications Discontinued During This Encounter  Medication Reason  . hydrocortisone (ANUSOL-HC) 2.5 % rectal cream Patient has not taken in last 30 days  . naproxen (NAPROSYN) 500 MG tablet Patient has not taken in last 30 days  . clonazePAM (KLONOPIN) 0.5 MG tablet Reorder  . promethazine (PHENERGAN) 12.5 MG tablet Reorder    Follow-up: Return in about 6 months (around 04/17/2018).   [name], MD

## 2017-10-17 LAB — HIV ANTIBODY (ROUTINE TESTING W REFLEX): HIV 1&2 Ab, 4th Generation: NONREACTIVE

## 2017-10-17 NOTE — Assessment & Plan Note (Signed)
Diagnosed in Oct 2014 with a1c of 7.8.  Has been  well-controlled on diet alone  .   Patient is reminded to schedule an annual eye exam and foot exam is normal today. Patient has no history of microalbuminuria. Patient is tolerating statin therapy for CAD risk reduction and on ACE/ARB for renal protection and hypertension.  Lab Results  Component Value Date   HGBA1C 6.0 10/23/2016   Lab Results  Component Value Date   MICROALBUR 0.8 10/16/2017   Lab Results  Component Value Date   ALT 21 10/16/2017   AST 21 10/16/2017   ALKPHOS 98 10/16/2017   BILITOT 0.4 10/16/2017

## 2017-10-17 NOTE — Assessment & Plan Note (Signed)
Well controlled on  CYMBALTA   And zoloft 100 mg daily . No changes today

## 2017-10-17 NOTE — Assessment & Plan Note (Signed)
Liver enzymes normalized with alcohol abstinence and weight loss   Lab Results  Component Value Date   ALT 21 10/16/2017   AST 21 10/16/2017   ALKPHOS 98 10/16/2017   BILITOT 0.4 10/16/2017

## 2017-10-17 NOTE — Assessment & Plan Note (Signed)
Well controlled on current regimen. Renal function stable, no changes today.  Lab Results  Component Value Date   CREATININE 0.89 10/16/2017   Lab Results  Component Value Date   NA 137 10/16/2017   K 3.9 10/16/2017   CL 99 10/16/2017   CO2 27 10/16/2017

## 2017-10-17 NOTE — Assessment & Plan Note (Addendum)
Annual comprehensive preventive exam was done as well as an evaluation and management of acute and chronic conditions .  During the course of the visit the patient was educated and counseled about appropriate screening and preventive services including :  diabetes screening, lipid analysis with projected  10 year  risk for CAD , nutrition counseling, prostate and colorectal cancer screening, and recommended immunizations.  Printed recommendations for health maintenance screenings was given.   Lab Results  Component Value Date   PSA 0.70 10/16/2017   PSA 0.65 03/30/2015   PSA 0.75 10/17/2011

## 2017-10-22 ENCOUNTER — Ambulatory Visit (INDEPENDENT_AMBULATORY_CARE_PROVIDER_SITE_OTHER): Payer: BLUE CROSS/BLUE SHIELD | Admitting: Orthopedic Surgery

## 2017-11-02 ENCOUNTER — Other Ambulatory Visit: Payer: Self-pay | Admitting: Internal Medicine

## 2017-11-29 ENCOUNTER — Telehealth: Payer: Self-pay

## 2017-11-29 ENCOUNTER — Telehealth: Payer: Self-pay | Admitting: *Deleted

## 2017-11-29 NOTE — Telephone Encounter (Signed)
See previous phone note.  

## 2017-11-29 NOTE — Telephone Encounter (Signed)
Patient stated he has been taking clonazepam 0.25 mg in the morning and 0.5 mg at night trying to ween off medication but this is making him feel jittery patient wanted to speak with PCP. [phone], tried to call patient to find out how long he has been taking this dose but no answer, left message to call office.

## 2017-11-29 NOTE — Telephone Encounter (Signed)
Copied from [place] [id number]. Topic: Inquiry >> Nov 29, 2017  1:03 PM Pricilla Handler wrote: Reason for CRM: Patient called stating that he has been trying to ween off of clonazePAM (KLONOPIN) 0.5 MG tablet. Patient has been taking a half tablet in the morning, and a Whole tablet at night. Patient states that he has been jittery. Please ask Dr. Derrel Nip to call the patient regarding this medication change.       Thank You!!!

## 2017-11-29 NOTE — Telephone Encounter (Signed)
Copied from [place] [id number]. Topic: Quick Communication - See Telephone Encounter >> Nov 29, 2017  2:54 PM Nanci Pina, LPN wrote: CRM for notification. See Telephone encounter for: 11/29/17. >> Nov 29, 2017  3:00 PM Keene Breath wrote: Patient is returning call to [facility].  Patient requests that Juliann Pulse call him back again by end of day today regarding his anxiety.  CB# [phone].

## 2017-11-29 NOTE — Telephone Encounter (Signed)
Yes, I will.  If it is Is cymbalta or trazodone , you can refill. For 90 days

## 2017-11-29 NOTE — Telephone Encounter (Signed)
If he is feeling jittery he should increase the 0.25 mg dose back up to 0.5 mg in the morning.  He can try reducing the evening dose to 0.25 mg next week.  Isn't  he seeing Dr Nicolasa Ducking ?  She is managing his anxiety and depression and his Klonipin wean.

## 2017-11-29 NOTE — Telephone Encounter (Signed)
Patient says he is not seeing Dr. Nicolasa Ducking anymore, patient says at last visit you were weaning him off the clonazepam, patient says he has a job now and feels better about himself and feels does not need to see Dr. Nicolasa Ducking. Patient also wanted to let you know his wife is in hospital and she is not doing very well said " I may have to go back to Dr. Nicolasa Ducking" if something happens.Patient is going to call with a medication that Dr. Nicolasa Ducking was giving him and wants to know if PCP will write this script, patient was at hospital when I called so when he goes home he will my chart medication.

## 2017-11-29 NOTE — Telephone Encounter (Signed)
Left message for patient to return call to office. [facility] nurse may advise patient. 

## 2017-11-30 ENCOUNTER — Other Ambulatory Visit: Payer: Self-pay | Admitting: *Deleted

## 2017-11-30 MED ORDER — DULOXETINE HCL 60 MG PO CPEP: 60.0000 mg | ORAL_CAPSULE | Freq: Every day | ORAL | 0 refills | Status: DC

## 2017-11-30 NOTE — Telephone Encounter (Signed)
The Cymbalta  refilled 90 days per PCP verbal order below.

## 2017-12-04 ENCOUNTER — Other Ambulatory Visit: Payer: Self-pay | Admitting: Internal Medicine

## 2017-12-05 ENCOUNTER — Other Ambulatory Visit: Payer: Self-pay | Admitting: Internal Medicine

## 2017-12-11 ENCOUNTER — Ambulatory Visit: Payer: Self-pay

## 2017-12-11 NOTE — Telephone Encounter (Addendum)
Started speaking with pt. About how his wife has been in the hospital for 5 weeks, has had surgery and now is on a ventilator. His boss was calling and asked for nurse to call him in 15 minutes. Called pt. Back. Asking if he could increase any of his medications while he is going through this difficult time with wife. Does not want to leave the hospital at this time for a office visit. Please advise pt.

## 2017-12-11 NOTE — Telephone Encounter (Signed)
[name] is requesting possible increase for his Klonopin. He is currently taking 0.5mg  twice daily and is wanting to know if Dr. Derrel Nip could increase the medication or suggest another one while his wife is in the hospital. He does not want to have to leave the hospital at this time to have to come in for an appt.   [name]

## 2017-12-12 MED ORDER — BUSPIRONE HCL 10 MG PO TABS: 10.0000 mg | ORAL_TABLET | Freq: Three times a day (TID) | ORAL | 1 refills | Status: DC

## 2017-12-12 NOTE — Telephone Encounter (Signed)
I DO not want him to increase his Klonipin.  He has had withdrawal with higher doses and should not imcrease his dose.  He can increase his zoloft to 1.5 tablets

## 2017-12-12 NOTE — Telephone Encounter (Signed)
Then he can add a medication called buspirone 10 mg up to 3 times daily for anxiety.  Called to [facility]

## 2017-12-12 NOTE — Telephone Encounter (Signed)
Spoke with patient he is already on Zoloft 1.5 tablets .

## 2017-12-12 NOTE — Addendum Note (Signed)
Addended by: [name] on: 12/12/2017 02:38 PM   Modules accepted: Orders

## 2017-12-12 NOTE — Telephone Encounter (Signed)
Patient advised of result and verbalized an understanding.  

## 2017-12-13 NOTE — Telephone Encounter (Signed)
Spoke with pt and he stated that the medication that Dr. Derrel Nip added yesterday, Buspar, was never called in to pharmacy. I told pt that I would call the pharmacy to see if they ever received the prescription and would give him a call back. Called pt back to let him know that they received the prescription today at 2:15pm and that it is ready to be picked up. Pt gave a verbal understanding.

## 2017-12-14 ENCOUNTER — Other Ambulatory Visit: Payer: Self-pay | Admitting: Internal Medicine

## 2017-12-27 ENCOUNTER — Other Ambulatory Visit: Payer: Self-pay | Admitting: Podiatry

## 2018-01-04 ENCOUNTER — Other Ambulatory Visit: Payer: Self-pay | Admitting: Internal Medicine

## 2018-01-09 ENCOUNTER — Other Ambulatory Visit: Payer: Self-pay | Admitting: Internal Medicine

## 2018-01-19 ENCOUNTER — Other Ambulatory Visit: Payer: Self-pay | Admitting: Internal Medicine

## 2018-01-28 ENCOUNTER — Other Ambulatory Visit: Payer: Self-pay | Admitting: Internal Medicine

## 2018-02-09 ENCOUNTER — Other Ambulatory Visit: Payer: Self-pay | Admitting: Internal Medicine

## 2018-02-22 ENCOUNTER — Other Ambulatory Visit: Payer: Self-pay | Admitting: Internal Medicine

## 2018-03-19 ENCOUNTER — Other Ambulatory Visit: Payer: Self-pay | Admitting: Internal Medicine

## 2018-04-21 ENCOUNTER — Other Ambulatory Visit: Payer: Self-pay | Admitting: Internal Medicine

## 2018-05-05 ENCOUNTER — Other Ambulatory Visit: Payer: Self-pay | Admitting: Internal Medicine

## 2018-05-06 NOTE — Telephone Encounter (Signed)
Refilled: 10/16/2017 Last OV: 10/16/2017 Next OV: not scheduled

## 2018-05-09 ENCOUNTER — Telehealth (HOSPITAL_COMMUNITY): Payer: Self-pay | Admitting: Psychiatry

## 2018-05-14 ENCOUNTER — Encounter (HOSPITAL_COMMUNITY): Payer: Self-pay | Admitting: Psychiatry

## 2018-05-14 ENCOUNTER — Ambulatory Visit (INDEPENDENT_AMBULATORY_CARE_PROVIDER_SITE_OTHER): Payer: PRIVATE HEALTH INSURANCE | Admitting: Psychiatry

## 2018-05-14 VITALS — BP 146/80 | HR 74 | Ht 72.0 in | Wt 216.0 lb

## 2018-05-14 DIAGNOSIS — F411 Generalized anxiety disorder: Secondary | ICD-10-CM | POA: Diagnosis not present

## 2018-05-14 DIAGNOSIS — F331 Major depressive disorder, recurrent, moderate: Secondary | ICD-10-CM | POA: Diagnosis not present

## 2018-05-14 MED ORDER — DULOXETINE HCL 60 MG PO CPEP: 60.0000 mg | ORAL_CAPSULE | Freq: Every day | ORAL | 0 refills | Status: DC

## 2018-05-14 MED ORDER — LAMOTRIGINE 25 MG PO TABS: ORAL_TABLET | ORAL | 1 refills | Status: DC

## 2018-05-14 NOTE — Progress Notes (Signed)
Psychiatric Initial Adult Assessment   Patient Identification: [name] MRN:  [id number] Date of Evaluation:  05/14/2018   Referral Source: Self-referred.  Chief Complaint:  I have depression and anxiety and I need to find a new psychiatrist.  I moved from [place] to [place].  Visit Diagnosis:    ICD-10-CM   1. MDD (major depressive disorder), recurrent episode, moderate (HCC) F33.1 lamoTRIgine (LAMICTAL) 25 MG tablet  2. GAD (generalized anxiety disorder) F41.1 DULoxetine (CYMBALTA) 60 MG capsule    History of Present Illness: Travas is a 53 year old Caucasian, unemployed married male who is self-referred for seeking help.  Patient has history of depression and anxiety since his wife had health issues.  Patient told he saw Dr. Jake Michaelis in [place] who prescribed him Zoloft, Klonopin and Cymbalta.  Later his primary care physician added BuSpar because he wanted to wean him from [place].  Patient continues to struggle with anxiety and depression.  He endorsed multiple stressors as his wife have chronic health issues and she was in the hospital for 45 days in a medical coma.  Her wife had heart problem and she was admitted at [facility] in July and require on heart-lung machine and she was discharged in September but again readmitted in December 53.  Patient appears very emotional and tearful when talking about his wife.  He endorsed that he lost job due to taking care of his wife.  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  He mentioned that he already had lost 2 other job in the past because of taking care of his wife and unable to work.  Patient told 2019 was a difficult year for him.  He had to sell his house and now he moving to a small place.  He endorsed feeling very sad losing his job but he has no option.  Currently he is taking Cymbalta, BuSpar, Klonopin and trazodone.  He is no longer  taking Zoloft because his primary care physician did not renew Zoloft 5 months ago.  He was seeing Dr. Jake Michaelis but since he moved to different location he cannot continue seeing her.  Patient denies any paranoia, hallucination, mania, psychosis or any suicidal thoughts.  He admitted feeling sad, lonely and lack of motivation to do things.  He also endorsed difficulty in concentration and attention since he is suffering from severe depression.  Patient has 2 children 30 and 53 year old they are married.  He lives with his wife.  Patient endorsed multiple health issues including back pain, shoulder pain, tingling, numbness and degenerative joint disease.  He is seeing Dr. Cristy Folks for pain medication.  He has shoulder surgery and total knee replacement surgery.  He has physical limitation.  Patient denies drinking or using any drugs.  His energy level is low.  Denies any mania or any aggression.  He denies any self abusive behavior.  He is willing to try any other medication but remember when he tried to stop the Klonopin and Cymbalta he had severe withdrawal symptoms.    Associated Signs/Symptoms: Depression Symptoms:  depressed mood, insomnia, feelings of worthlessness/guilt, difficulty concentrating, hopelessness, anxiety, loss of energy/fatigue, disturbed sleep, (Hypo) Manic Symptoms:  Distractibility, Labiality of Mood, Anxiety Symptoms:  Excessive Worry, Psychotic Symptoms:  no psychotic symptoms. PTSD Symptoms: Negative  Past Psychiatric History:  No history of suicidal attempt or any psychiatric inpatient treatment.  Depression started after wife  got very sick.  Seen Dr. Jake Michaelis prescribed Zoloft, Klonopin and Cymbalta.  PCP continue Cymbalta and Klonopin and added BuSpar and trazodone.  No history of psychosis and mania.    Previous Psychotropic Medications: Yes   Substance Abuse History in the last 12 months:  No.  Consequences of Substance Abuse: Negative  Past Medical History:   Past Medical History:  Diagnosis Date  . Anxiety   . Anxiety and depression   . Bruit    L  . Chest pain    hx  . Diabetes mellitus without complication ([place])    "borderline"  . Edema   . Fatty liver   . GERD (gastroesophageal reflux disease)    uses Omeprazole  . Goiter   . HLD (hyperlipidemia)   . Hypertension    essential, benign  . Hypothyroidism   . Impotence of organic origin   . Murmur   . Other chest pain    tightness, pressure  . Palpitation    hx  . Precordial pain   . Sleep apnea    uses cpap    Past Surgical History:  Procedure Laterality Date  . APPENDECTOMY  2002   done at [facility]  . CARDIAC CATHETERIZATION    . CARDIAC CATHETERIZATION N/A 08/27/2015   Procedure: Left Heart Cath and Coronary Angiography;  Surgeon: Jolaine Artist, MD;  Location: [facility];  Service: Cardiovascular;  Laterality: N/A;  . COLONOSCOPY    . HERNIA REPAIR  [id number]   umbilical  . JOINT REPLACEMENT     right knee  . REVISION TOTAL KNEE ARTHROPLASTY Right 06/23/2014   DR Marlou Sa  . TONSILLECTOMY    . TOTAL KNEE ARTHROPLASTY Right 2011   right  . TOTAL KNEE REVISION Right 06/23/2014   Procedure: TOTAL KNEE REVISION;  Surgeon: Meredith Pel, MD;  Location: [facility];  Service: Orthopedics;  Laterality: Right;    Family Psychiatric History: Reviewed.  Family History:  Family History  Problem Relation Age of Onset  . Heart disease Father     Social History:   Social History   Socioeconomic History  . Marital status: Married    Spouse name: Not on file  . Number of children: 2  . Years of education: Not on file  . Highest education level: Not on file  Occupational History  . Occupation: IT trainer  . Occupation: SERVICE DIRECTOR    Employer: [employer]  Social Needs  . Financial resource strain: Not on file  . Food insecurity:    Worry: Not on file    Inability: Not on file  . Transportation  needs:    Medical: Not on file    Non-medical: Not on file  Tobacco Use  . Smoking status: Former Smoker    Packs/day: 2.00    Years: 18.00    Pack years: 36.00    Types: Cigarettes    Last attempt to quit: 04/24/2005    Years since quitting: 13.0  . Smokeless tobacco: Never Used  . Tobacco comment: quit 20 years ago   Substance and Sexual Activity  . Alcohol use: Yes    Comment: occasional  . Drug use: No  . Sexual activity: Yes  Lifestyle  . Physical activity:    Days per week: Not on file    Minutes per session: Not on file  . Stress: Not on file  Relationships  . Social connections:    Talks on phone:  Not on file    Gets together: Not on file    Attends religious service: Not on file    Active member of club or organization: Not on file    Attends meetings of clubs or organizations: Not on file    Relationship status: Not on file  Other Topics Concern  . Not on file  Social History Narrative   Married, gets regular exercise.     Additional Social History: Patient born and raised in [place].  Is been married for 33 years.  He has 2 children who are married.  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  In 2019 he lost 3 times as he need to be available for his wife who had health issues.  Allergies:   Allergies  Allergen Reactions  . Zolpidem Other (See Comments)    Metabolic Disorder Labs: Lab Results  Component Value Date   HGBA1C 6.0 10/23/2016   No results found for: PROLACTIN Lab Results  Component Value Date   CHOL 164 10/16/2017   TRIG 221.0 (H) 10/16/2017   HDL 44.60 10/16/2017   CHOLHDL 4 10/16/2017   VLDL 44.2 (H) 10/16/2017   LDLCALC 91 06/05/2016   LDLCALC 75 09/13/2015   Lab Results  Component Value Date   TSH 2.24 10/16/2017    Therapeutic Level Labs: No results found for: LITHIUM No results found for: CBMZ No results found for: VALPROATE  Current  Medications: Current Outpatient Medications  Medication Sig Dispense Refill  . Acetaminophen-Codeine (TYLENOL/CODEINE #3) 300-30 MG tablet Take 1 tablet by mouth every 8 (eight) hours.    Marland Kitchen aspirin EC 81 MG tablet Take 81 mg by mouth daily.    Marland Kitchen BAYER MICROLET LANCETS lancets Use as instructed 100 each 12  . busPIRone (BUSPAR) 10 MG tablet Take 1 tablet (10 mg total) by mouth 3 (three) times daily. 90 tablet 1  . Clobetasol Propionate 0.05 % shampoo Apply to dry scalp and leave on for 15 minutes 118 mL 5  . clonazePAM (KLONOPIN) 0.5 MG tablet TAKE 1 TABLET BY MOUTH TWICE DAILY 60 tablet 1  . dicyclomine (BENTYL) 20 MG tablet TAKE 1 TABLET (20 MG TOTAL) BY MOUTH EVERY 6 (SIX) HOURS. 120 tablet 5  . DULoxetine (CYMBALTA) 60 MG capsule TAKE 1 CAPSULE (60 MG TOTAL) BY MOUTH DAILY WITH BREAKFAST. 90 capsule 0  . gabapentin (NEURONTIN) 600 MG tablet gabapentin 600 mg tablet    . ibuprofen (ADVIL,MOTRIN) 200 MG tablet Take 400 mg by mouth every 6 (six) hours as needed (pain).    Marland Kitchen levothyroxine (SYNTHROID, LEVOTHROID) 88 MCG tablet TAKE 1 TABLET BY MOUTH EVERY DAY BEFORE BREAKFAST 90 tablet 1  . lisinopril-hydrochlorothiazide (PRINZIDE,ZESTORETIC) 20-25 MG tablet TAKE 1 TABLET BY MOUTH EVERY DAY 30 tablet 5  . meloxicam (MOBIC) 15 MG tablet Take 1 tablet (15 mg total) by mouth daily. 30 tablet 0  . methocarbamol (ROBAXIN) 500 MG tablet TAKE 1 TABLET (500 MG TOTAL) BY MOUTH EVERY 8 (EIGHT) HOURS AS NEEDED. 90 tablet 2  . metoprolol tartrate (LOPRESSOR) 25 MG tablet TAKE 1 TABLET BY MOUTH TWICE A DAY 60 tablet 5  . Multiple Vitamin (MULTIVITAMIN WITH MINERALS) TABS tablet Take 1 tablet by mouth daily.    Marland Kitchen omeprazole (PRILOSEC) 40 MG capsule TAKE 1 CAPSULE BY MOUTH TWICE A DAY 180 capsule 1  . ondansetron (ZOFRAN) 4 MG tablet Take 1 tablet (4 mg total) by  mouth every 8 (eight) hours as needed for nausea or vomiting. 20 tablet 0  . promethazine (PHENERGAN) 12.5 MG tablet TAKE 1 TABLET (12.5 MG TOTAL) BY  MOUTH EVERY 8 (EIGHT) HOURS AS NEEDED FOR NAUSEA OR VOMITING. 20 tablet 0  . rosuvastatin (CRESTOR) 20 MG tablet TAKE 1 TABLET BY MOUTH EVERY DAY 90 tablet 1  . sertraline (ZOLOFT) 100 MG tablet Take 1.5 tablets (150 mg total) by mouth daily. 45 tablet 2  . traZODone (DESYREL) 50 MG tablet TAKE 1-2 TABLETS BY MOUTH NIGHTLY AS NEEDED FOR SLEEP  2  . amoxicillin (AMOXIL) 500 MG capsule Take 4 capsules prior to dental procedures. (Patient not taking: Reported on 05/14/2018) 4 capsule 1  . halobetasol (ULTRAVATE) 0.05 % cream Apply topically 2 (two) times daily. (Patient not taking: Reported on 05/14/2018) 50 g 0   No current facility-administered medications for this visit.     Musculoskeletal: Strength & Muscle Tone: within normal limits Gait & Station: normal Patient leans: N/A  Psychiatric Specialty Exam: Review of Systems  Musculoskeletal: Positive for back pain and joint pain.  Neurological: Positive for tingling.  Psychiatric/Behavioral: Positive for depression. The patient is nervous/anxious and has insomnia.     Blood pressure (!) 146/80, pulse 74, height 6' (1.829 m), weight 216 lb (98 kg).Body mass index is 29.29 kg/m.  General Appearance: Casual  Eye Contact:  Fair  Speech:  Normal Rate  Volume:  Normal  Mood:  Anxious and Dysphoric  Affect:  Constricted  Thought Process:  Descriptions of Associations: Intact  Orientation:  Full (Time, Place, and Person)  Thought Content:  Rumination  Suicidal Thoughts:  No  Homicidal Thoughts:  No  Memory:  Immediate;   Good Recent;   Good Remote;   Good  Judgement:  Good  Insight:  Good  Psychomotor Activity:  Increased and Restlessness  Concentration:  Concentration: Fair and Attention Span: Fair  Recall:  Good  Fund of Knowledge:Good  Language: Good  Akathisia:  No  Handed:  Right  AIMS (if indicated):  not done  Assets:  Communication Skills Desire for Improvement Resilience Transportation  ADL's:  Intact  Cognition: WNL   Sleep:  Fair   Screenings: PHQ2-9     Office Visit from 02/23/2017 in [facility] Office Visit from 09/06/2016 in [facility]  PHQ-2 Total Score  3  2  PHQ-9 Total Score  17  8      Assessment and Plan: Major depressive disorder, recurrent.  Generalized anxiety disorder.  I reviewed psychosocial stressors and current medication.  Discussed benzodiazepine dependence tolerance and withdrawal.  Recommended to try Lamictal 25 mg daily for 1 week and then 50 mg daily to help his depression and anxiety symptoms.  Recommended to wean himself slowly from Klonopin and discuss potential withdrawal symptoms.  Continue Cymbalta 60 mg daily.  Continue trazodone 50 mg at bedtime.  Discussed medication side effect specially Lamictal can cause rash in that case he need to stop the medication immediately.  I also believe he should see a therapist in this office for CBT.  Patient lives near [place] and we will referred him to see a therapist in our [place] office.  Discussed safety concerns at any time having active suicidal thoughts or homicidal thought the need to call 911 or go to local emergency room.  Follow-up in 4 weeks.   Kathlee Nations, MD 1/21/20201:15 PM

## 2018-05-19 ENCOUNTER — Other Ambulatory Visit: Payer: Self-pay | Admitting: Internal Medicine

## 2018-05-19 DIAGNOSIS — F411 Generalized anxiety disorder: Secondary | ICD-10-CM

## 2018-05-22 ENCOUNTER — Encounter: Payer: Self-pay | Admitting: Podiatry

## 2018-05-22 ENCOUNTER — Ambulatory Visit (INDEPENDENT_AMBULATORY_CARE_PROVIDER_SITE_OTHER): Payer: PRIVATE HEALTH INSURANCE

## 2018-05-22 ENCOUNTER — Ambulatory Visit: Payer: PRIVATE HEALTH INSURANCE | Admitting: Podiatry

## 2018-05-22 DIAGNOSIS — M79672 Pain in left foot: Secondary | ICD-10-CM

## 2018-05-22 DIAGNOSIS — M205X2 Other deformities of toe(s) (acquired), left foot: Secondary | ICD-10-CM

## 2018-05-22 DIAGNOSIS — M205X9 Other deformities of toe(s) (acquired), unspecified foot: Secondary | ICD-10-CM

## 2018-05-22 DIAGNOSIS — G8929 Other chronic pain: Secondary | ICD-10-CM

## 2018-05-22 DIAGNOSIS — M205X1 Other deformities of toe(s) (acquired), right foot: Secondary | ICD-10-CM

## 2018-05-22 MED ORDER — MELOXICAM 15 MG PO TABS: 15.0000 mg | ORAL_TABLET | Freq: Every day | ORAL | 1 refills | Status: DC

## 2018-05-22 MED ORDER — METHYLPREDNISOLONE 4 MG PO TBPK: ORAL_TABLET | ORAL | 0 refills | Status: DC

## 2018-05-22 NOTE — Patient Instructions (Signed)
Pre-Operative Instructions  Congratulations, you have decided to take an important step towards improving your quality of life.  You can be assured that the doctors and staff at [facility] will be with you every step of the way.  Here are some important things you should know:  1. Plan to be at the surgery center/hospital at least 1 (one) hour prior to your scheduled time, unless otherwise directed by the surgical center/hospital staff.  You must have a responsible adult accompany you, remain during the surgery and drive you home.  Make sure you have directions to the surgical center/hospital to ensure you arrive on time. 2. If you are having surgery at [facility] or [facility] hospitals, you will need a copy of your medical history and physical form from your family physician within one month prior to the date of surgery. We will give you a form for your primary physician to complete.  3. We make every effort to accommodate the date you request for surgery.  However, there are times where surgery dates or times have to be moved.  We will contact you as soon as possible if a change in schedule is required.   4. No aspirin/ibuprofen for one week before surgery.  If you are on aspirin, any non-steroidal anti-inflammatory medications (Mobic, Aleve, Ibuprofen) should not be taken seven (7) days prior to your surgery.  You make take Tylenol for pain prior to surgery.  5. Medications - If you are taking daily heart and blood pressure medications, seizure, reflux, allergy, asthma, anxiety, pain or diabetes medications, make sure you notify the surgery center/hospital before the day of surgery so they can tell you which medications you should take or avoid the day of surgery. 6. No food or drink after midnight the night before surgery unless directed otherwise by surgical center/hospital staff. 7. No alcoholic beverages 24-hours prior to surgery.  No smoking 24-hours prior or 24-hours after  surgery. 8. Wear loose pants or shorts. They should be loose enough to fit over bandages, boots, and casts. 9. Don't wear slip-on shoes. Sneakers are preferred. 10. Bring your boot with you to the surgery center/hospital.  Also bring crutches or a walker if your physician has prescribed it for you.  If you do not have this equipment, it will be provided for you after surgery. 11. If you have not been contacted by the surgery center/hospital by the day before your surgery, call to confirm the date and time of your surgery. 12. Leave-time from work may vary depending on the type of surgery you have.  Appropriate arrangements should be made prior to surgery with your employer. 13. Prescriptions will be provided immediately following surgery by your doctor.  Fill these as soon as possible after surgery and take the medication as directed. Pain medications will not be refilled on weekends and must be approved by the doctor. 14. Remove nail polish on the operative foot and avoid getting pedicures prior to surgery. 15. Wash the night before surgery.  The night before surgery wash the foot and leg well with water and the antibacterial soap provided. Be sure to pay special attention to beneath the toenails and in between the toes.  Wash for at least three (3) minutes. Rinse thoroughly with water and dry well with a towel.  Perform this wash unless told not to do so by your physician.  Enclosed: 1 Ice pack (please put in freezer the night before surgery)   1 Hibiclens skin cleaner     Pre-op instructions  If you have any questions regarding the instructions, please do not hesitate to call our office.  [place]: [address], [place], [place] [address] -- [phone]  [place]: [address] [place], [place] [address] -- [phone]  [place]: [address]  [place], [place] [address] -- [phone]  [place]: [address], [address], [place], [place] [address] -- [phone]  Website:  [email or web address] 

## 2018-05-29 NOTE — Progress Notes (Signed)
   HPI: 53 year old male presenting today for follow up evaluation of hallux limitus of the left foot. He states the pain never really resolved and is now worse and more frequent. He reports associated intermittent numbness of the toe. Trying to move the toe increases the pain. He has taken pain medication for treatment with minimal relief. Patient is here for further evaluation and treatment.   Past Medical History:  Diagnosis Date  . Anxiety   . Anxiety and depression   . Bruit    L  . Chest pain    hx  . Diabetes mellitus without complication ([place])    "borderline"  . Edema   . Fatty liver   . GERD (gastroesophageal reflux disease)    uses Omeprazole  . Goiter   . HLD (hyperlipidemia)   . Hypertension    essential, benign  . Hypothyroidism   . Impotence of organic origin   . Murmur   . Other chest pain    tightness, pressure  . Palpitation    hx  . Precordial pain   . Sleep apnea    uses cpap     Physical Exam: General: The patient is alert and oriented x3 in no acute distress.  Dermatology: Skin is warm, dry and supple bilateral lower extremities. Negative for open lesions or macerations.  Vascular: Palpable pedal pulses bilaterally. No edema or erythema noted. Capillary refill within normal limits.  Neurological: Epicritic and protective threshold grossly intact bilaterally.   Musculoskeletal Exam: Pain on palpation with limited range of motion noted to the first MPJ left foot.  Radiographic Exam:  Normal osseous mineralization. Joint spaces preserved. No fracture/dislocation/boney destruction.     Assessment: 1. Hallux limitus left    Plan of Care:  1. Patient evaluated.  2. Today we discussed the conservative versus surgical management of the presenting pathology. The patient opts for surgical management. All possible complications and details of the procedure were explained. All patient questions were answered. No guarantees were expressed or implied. 3.  Authorization for surgery was reinitiated today. Surgery will consist of left great toe arthroplasty with silicone implant.  4. Prescription for Medrol Dose Pak provided to patient. 5. Prescription for Meloxicam provided to patient. 6. Return to clinic one week post op.       Edrick Kins, DPM [facility]  Dr. Edrick Kins, DPM    [address], [place] [address]                Office [phone]  Fax [phone]

## 2018-06-05 ENCOUNTER — Encounter (INDEPENDENT_AMBULATORY_CARE_PROVIDER_SITE_OTHER): Payer: Self-pay | Admitting: Orthopedic Surgery

## 2018-06-05 ENCOUNTER — Ambulatory Visit (INDEPENDENT_AMBULATORY_CARE_PROVIDER_SITE_OTHER): Payer: PRIVATE HEALTH INSURANCE | Admitting: Orthopedic Surgery

## 2018-06-05 DIAGNOSIS — M25511 Pain in right shoulder: Secondary | ICD-10-CM | POA: Diagnosis not present

## 2018-06-05 DIAGNOSIS — S46011D Strain of muscle(s) and tendon(s) of the rotator cuff of right shoulder, subsequent encounter: Secondary | ICD-10-CM | POA: Diagnosis not present

## 2018-06-05 DIAGNOSIS — G8929 Other chronic pain: Secondary | ICD-10-CM

## 2018-06-05 MED ORDER — ACETAMINOPHEN-CODEINE #3 300-30 MG PO TABS: ORAL_TABLET | ORAL | 0 refills | Status: DC

## 2018-06-05 NOTE — Progress Notes (Signed)
Office Visit Note   Patient: [name]           Date of Birth: [birth year]           MRN: [id number] Visit Date: 06/05/2018 Requested by: [name], MD [address] [place], [place] [address] PCP: [name], MD  Subjective: Chief Complaint  Patient presents with  . Right Shoulder - Pain    HPI: [name] is a patient with right shoulder pain.  2 years ago he fell at work.  I saw him at that time but he went to another provider for treatment.  That was mandated by [employer].  He had surgery and had subsequent MRI scan which showed re-tear of the repair.  He is not had that fixed yet.  Reports continued pain.  Had a lot of issues with his wife requiring hospitalizations this year.  He is in a pain clinic taking Tylenol 3.  Currently is out of the pain clinic because of insurance issues.  Does describe weakness and popping in that right shoulder but he is functional with the arm.  Had to sell his house and is currently living in a camper.              ROS: All systems reviewed are negative as they relate to the chief complaint within the history of present illness.  Patient denies  fevers or chills.   Assessment & Plan: Visit Diagnoses:  1. Chronic right shoulder pain   2. Traumatic complete tear of right rotator cuff, subsequent encounter     Plan: Impression is re-tear of the repaired rotator cuff tear done 2 years ago elsewhere.  Currently the patient is pretty functional.  I do not think this is a great time for him to do any type of massive surgery and rehab.  I will refer him to a pain clinic for evaluation.  Refill Tylenol 3 x1.  I will see him back as needed.  I think when the time is right he may need to have that right shoulder reimaged and evaluated for possible repair.  Strength is actually pretty reasonable at this time on exam today.  Follow-Up Instructions: Return if symptoms worsen or fail to improve.   Orders:  Orders Placed This  Encounter  Procedures  . Ambulatory referral to Pain Clinic   Meds ordered this encounter  Medications  . acetaminophen-codeine (TYLENOL #3) 300-30 MG tablet    Sig: TAKE 1 TAB PO Q8 HOURS    Dispense:  40 tablet    Refill:  0      Procedures: No procedures performed   Clinical Data: No additional findings.  Objective: Vital Signs: There were no vitals taken for this visit.  Physical Exam:   Constitutional: Patient appears well-developed HEENT:  Head: Normocephalic Eyes:EOM are normal Neck: Normal range of motion Cardiovascular: Normal rate Pulmonary/chest: Effort normal Neurologic: Patient is alert Skin: Skin is warm Psychiatric: Patient has normal mood and affect    Ortho Exam: Ortho exam demonstrates full active and passive range of motion of that right arm.  Does have a little weakness to supraspinatus testing on the right compared to the left.  Not as much coarse grinding and crepitus as I would expect from reading the MRI report which shows about a 20% re-tear of the repaired tendon.  Motor sensory function to the hand is intact.  Right knee is also examined there is no effusion and very good range of motion.  Specialty Comments:  No specialty comments available.  Imaging: No results found.   PMFS History: Patient Active Problem List   Diagnosis Date Noted  . Angioedema of lips 11/26/2016  . Cognitive complaints with normal neuropsychological exam 10/23/2016  . 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 Hypothyroidism due to acquired atrophy of thyroid 09/14/2015  . Arthritis of knee 06/23/2014  . Encounter for preventive health examination 06/17/2014  . Enlarged RV (right ventricle) 06/17/2014  . Cervical spine degeneration 02/17/2014  . Overweight 10/29/2013  . Chronic knee pain 10/29/2013  . Diabetes mellitus type 2 in obese ([place])  10/23/2012  . Seborrheic dermatitis, unspecified 05/17/2012  . Fatty liver 04/22/2012  . Polyarthralgia 12/24/2010  . Sciatica of right side 12/24/2010  . Hyperlipidemia associated with type 2 diabetes mellitus ([place]) 01/13/2009  . Anxiety state 01/13/2009  . Essential hypertension 01/13/2009  . OSA (obstructive sleep apnea) 01/13/2009   Past Medical History:  Diagnosis Date  . Anxiety   . Anxiety and depression   . Bruit    L  . Chest pain    hx  . Diabetes mellitus without complication ([place])    "borderline"  . Edema   . Fatty liver   . GERD (gastroesophageal reflux disease)    uses Omeprazole  . Goiter   . HLD (hyperlipidemia)   . Hypertension    essential, benign  . Hypothyroidism   . Impotence of organic origin   . Murmur   . Other chest pain    tightness, pressure  . Palpitation    hx  . Precordial pain   . Sleep apnea    uses cpap    Family History  Problem Relation Age of Onset  . Heart disease Father     Past Surgical History:  Procedure Laterality Date  . APPENDECTOMY  2002   done at [facility]  . CARDIAC CATHETERIZATION    . CARDIAC CATHETERIZATION N/A 08/27/2015   Procedure: Left Heart Cath and Coronary Angiography;  Surgeon: Jolaine Artist, MD;  Location: [facility];  Service: Cardiovascular;  Laterality: N/A;  . COLONOSCOPY    . HERNIA REPAIR  06-05-22   umbilical  . JOINT REPLACEMENT     right knee  . REVISION TOTAL KNEE ARTHROPLASTY Right 06/23/2014   DR Marlou Sa  . TONSILLECTOMY    . TOTAL KNEE ARTHROPLASTY Right 2011   right  . TOTAL KNEE REVISION Right 06/23/2014   Procedure: TOTAL KNEE REVISION;  Surgeon: Meredith Pel, MD;  Location: [facility];  Service: Orthopedics;  Laterality: Right;   Social History   Occupational History  . Occupation: IT trainer  . Occupation: SERVICE DIRECTOR    Employer: [employer]  Tobacco Use  . Smoking status: Former Smoker    Packs/day:  2.00    Years: 18.00    Pack years: 36.00    Types: Cigarettes    Last attempt to quit: 04/24/2005    Years since quitting: 13.1  . Smokeless tobacco: Never Used  . Tobacco comment: quit 20 years ago   Substance and Sexual Activity  . Alcohol use: Yes    Comment: occasional  . Drug use: No  . Sexual activity: Yes

## 2018-06-17 ENCOUNTER — Other Ambulatory Visit: Payer: Self-pay | Admitting: Internal Medicine

## 2018-06-24 ENCOUNTER — Telehealth: Payer: Self-pay | Admitting: Internal Medicine

## 2018-06-24 ENCOUNTER — Telehealth (HOSPITAL_COMMUNITY): Payer: Self-pay | Admitting: Unknown Physician Specialty

## 2018-06-24 MED ORDER — OSELTAMIVIR PHOSPHATE 75 MG PO CAPS: 75.0000 mg | ORAL_CAPSULE | Freq: Two times a day (BID) | ORAL | 0 refills | Status: AC

## 2018-06-24 MED ORDER — BUSPIRONE HCL 10 MG PO TABS: 10.0000 mg | ORAL_TABLET | Freq: Three times a day (TID) | ORAL | 1 refills | Status: DC

## 2018-06-24 NOTE — Telephone Encounter (Signed)
Copied from [place] [id number]. Topic: Quick Communication - Rx Refill/Question >> Jun 24, 2018 11:07 AM Virl Axe D wrote: Medication: busPIRone (BUSPAR) 10 MG tablet / Pt stated he will be out of busPIRone (BUSPAR) 10 MG tablet as of tomorrow morning. He would like to know if there is any way to have it refilled today. Please advise. Medication is not on current list.  Has the patient contacted their pharmacy? Yes.   (Agent: If no, request that the patient contact the pharmacy for the refill.) (Agent: If yes, when and what did the pharmacy advise?)  Preferred Pharmacy (with phone number or street name): [place], [place] (Phone) [phone] (Fax)  Agent: Please be advised that RX refills may take up to 3 business days. We ask that you follow-up with your pharmacy.

## 2018-06-24 NOTE — Telephone Encounter (Signed)
Buspar is not in the medications current medication list but it was last refilled on 04/22/2018.  Last OV: 10/16/2017 Next OV: not scheduled

## 2018-06-24 NOTE — Telephone Encounter (Signed)
See note below.Buspar 10 mg is not on pt's current med list.  LOV 10/16/17 with Dr. Derrel Nip

## 2018-06-24 NOTE — Telephone Encounter (Signed)
Pts wife is an [facility] patient both have been exposed to the flu. Tamiflu sent to pharmacy for prophylactic dose per Dr. Haroldine Laws.  Tanda Rockers RN, BSN VAD Coordinator 24/7 Pager [phone]

## 2018-06-27 ENCOUNTER — Ambulatory Visit (INDEPENDENT_AMBULATORY_CARE_PROVIDER_SITE_OTHER): Payer: PRIVATE HEALTH INSURANCE | Admitting: Psychiatry

## 2018-06-27 DIAGNOSIS — F331 Major depressive disorder, recurrent, moderate: Secondary | ICD-10-CM | POA: Diagnosis not present

## 2018-06-27 MED ORDER — LAMOTRIGINE 100 MG PO TABS: ORAL_TABLET | ORAL | 1 refills | Status: DC

## 2018-06-27 NOTE — Progress Notes (Signed)
BH MD/PA/NP OP Progress Note  06/27/2018 8:46 AM [name]  MRN:  [id number]  Chief Complaint: I am doing better.  New medicine helping.  I still have a lot of anxiety.  HPI: [name] came for his appointment.  He is a 53 year old Caucasian unemployed man who was seen first time on January 21.  He has a history of depression anxiety.  He is taking Cymbalta, BuSpar, Klonopin from his primary care physician.  We started him on Lamictal and he is taking 50 mg daily.  He tolerating his medication and he has no tremors shakes or any EPS.  He has no rash related to Lamictal.  He is pleased that his wife is at home and there has been no recent infection.  She is getting physical therapy at home.  He continued to have residual depression and cry at least once a week.  Patient has multiple health issues including chronic pain, tingling and he is looking for a pain management.  He had a pain specialist when he was living in [place] but since he moved here to find a new one.  He is sleeping on and off.  He applied for disability as he cannot work because of chronic pain.  Patient has 2 children who lives close by.  Patient denies any mania, psychosis or any hallucination.  We have recommended to see a therapist but he has been busy taking care of his wife but promised that he will call therapist at our [place] office.  Visit Diagnosis:    ICD-10-CM   1. MDD (major depressive disorder), recurrent episode, moderate (HCC) F33.1 lamoTRIgine (LAMICTAL) 100 MG tablet    Past Psychiatric History: Reviewed. H/O depression and anxiety.  No h/o suicidal attempt or inpatient treatment.  Seen Dr. Jake Michaelis and given Zoloft, Klonopin and Cymbalta.  Later PCP added BuSpar and trazodone.  Past Medical History:  Past Medical History:  Diagnosis Date  . Anxiety   . Anxiety and depression   . Bruit    L  . Chest pain    hx  . Diabetes mellitus without complication ([place])    "borderline"  . Edema   . Fatty  liver   . GERD (gastroesophageal reflux disease)    uses Omeprazole  . Goiter   . HLD (hyperlipidemia)   . Hypertension    essential, benign  . Hypothyroidism   . Impotence of organic origin   . Murmur   . Other chest pain    tightness, pressure  . Palpitation    hx  . Precordial pain   . Sleep apnea    uses cpap    Past Surgical History:  Procedure Laterality Date  . APPENDECTOMY  2002   done at [facility]  . CARDIAC CATHETERIZATION    . CARDIAC CATHETERIZATION N/A 08/27/2015   Procedure: Left Heart Cath and Coronary Angiography;  Surgeon: Jolaine Artist, MD;  Location: [facility];  Service: Cardiovascular;  Laterality: N/A;  . COLONOSCOPY    . HERNIA REPAIR  06-02-45   umbilical  . JOINT REPLACEMENT     right knee  . REVISION TOTAL KNEE ARTHROPLASTY Right 06/23/2014   DR Marlou Sa  . TONSILLECTOMY    . TOTAL KNEE ARTHROPLASTY Right 2011   right  . TOTAL KNEE REVISION Right 06/23/2014   Procedure: TOTAL KNEE REVISION;  Surgeon: Meredith Pel, MD;  Location: [facility];  Service: Orthopedics;  Laterality: Right;    Family Psychiatric History: Reviewed.  Family History:  Family History  Problem Relation Age of Onset  . Heart disease Father     Social History:  Social History   Socioeconomic History  . Marital status: Married    Spouse name: Not on file  . Number of children: 2  . Years of education: Not on file  . Highest education level: Not on file  Occupational History  . Occupation: IT trainer  . Occupation: SERVICE DIRECTOR    Employer: [employer]  Social Needs  . Financial resource strain: Not on file  . Food insecurity:    Worry: Not on file    Inability: Not on file  . Transportation needs:    Medical: Not on file    Non-medical: Not on file  Tobacco Use  . Smoking status: Former Smoker    Packs/day: 2.00    Years: 18.00    Pack years: 36.00    Types: Cigarettes    Last attempt  to quit: 04/24/2005    Years since quitting: 13.1  . Smokeless tobacco: Never Used  . Tobacco comment: quit 20 years ago   Substance and Sexual Activity  . Alcohol use: Yes    Comment: occasional  . Drug use: No  . Sexual activity: Yes  Lifestyle  . Physical activity:    Days per week: Not on file    Minutes per session: Not on file  . Stress: Not on file  Relationships  . Social connections:    Talks on phone: Not on file    Gets together: Not on file    Attends religious service: Not on file    Active member of club or organization: Not on file    Attends meetings of clubs or organizations: Not on file    Relationship status: Not on file  Other Topics Concern  . Not on file  Social History Narrative   Married, gets regular exercise.     Allergies:  Allergies  Allergen Reactions  . Zolpidem Other (See Comments)    Metabolic Disorder Labs: Lab Results  Component Value Date   HGBA1C 6.0 10/23/2016   No results found for: PROLACTIN Lab Results  Component Value Date   CHOL 164 10/16/2017   TRIG 221.0 (H) 10/16/2017   HDL 44.60 10/16/2017   CHOLHDL 4 10/16/2017   VLDL 44.2 (H) 10/16/2017   LDLCALC 91 06/05/2016   LDLCALC 75 09/13/2015   Lab Results  Component Value Date   TSH 2.24 10/16/2017   TSH 1.98 01/04/2017    Therapeutic Level Labs: No results found for: LITHIUM No results found for: VALPROATE No components found for:  CBMZ  Current Medications: Current Outpatient Medications  Medication Sig Dispense Refill  . acetaminophen-codeine (TYLENOL #3) 300-30 MG tablet TAKE 1 TAB PO Q8 HOURS 40 tablet 0  . Acetaminophen-Codeine (TYLENOL/CODEINE #3) 300-30 MG tablet Take 1 tablet by mouth every 8 (eight) hours.    Marland Kitchen aspirin EC 81 MG tablet Take 81 mg by mouth daily.    Marland Kitchen BAYER MICROLET LANCETS lancets Use as instructed 100 each 12  . busPIRone (BUSPAR) 10 MG tablet Take 1 tablet (10 mg total) by mouth 3 (three) times daily. 270 tablet 1  . Clobetasol  Propionate 0.05 % shampoo Apply to dry scalp and leave on for 15 minutes 118 mL 5  . clonazePAM (KLONOPIN) 0.5 MG tablet TAKE 1 TABLET BY MOUTH TWICE DAILY 60 tablet 1  . dicyclomine (BENTYL) 20 MG tablet TAKE 1  TABLET (20 MG TOTAL) BY MOUTH EVERY 6 (SIX) HOURS. 120 tablet 5  . DULoxetine (CYMBALTA) 60 MG capsule TAKE 1 CAPSULE (60 MG TOTAL) BY MOUTH DAILY WITH BREAKFAST. 90 capsule 1  . gabapentin (NEURONTIN) 600 MG tablet gabapentin 600 mg tablet    . halobetasol (ULTRAVATE) 0.05 % cream Apply topically 2 (two) times daily. 50 g 0  . ibuprofen (ADVIL,MOTRIN) 200 MG tablet Take 400 mg by mouth every 6 (six) hours as needed (pain).    Marland Kitchen lamoTRIgine (LAMICTAL) 25 MG tablet Take one tab daily for 1 week and than 2 tab daily 60 tablet 1  . levothyroxine (SYNTHROID, LEVOTHROID) 88 MCG tablet TAKE 1 TABLET BY MOUTH ONCE DAILY BEFORE BREAKFAST 90 tablet 0  . lisinopril-hydrochlorothiazide (PRINZIDE,ZESTORETIC) 20-25 MG tablet TAKE 1 TABLET BY MOUTH EVERY DAY 30 tablet 5  . methocarbamol (ROBAXIN) 500 MG tablet TAKE 1 TABLET (500 MG TOTAL) BY MOUTH EVERY 8 (EIGHT) HOURS AS NEEDED. 90 tablet 2  . methylPREDNISolone (MEDROL DOSEPAK) 4 MG TBPK tablet 6 day dose pack - take as directed 21 tablet 0  . metoprolol tartrate (LOPRESSOR) 25 MG tablet TAKE 1 TABLET BY MOUTH TWICE A DAY 60 tablet 5  . Multiple Vitamin (MULTIVITAMIN WITH MINERALS) TABS tablet Take 1 tablet by mouth daily.    Marland Kitchen omeprazole (PRILOSEC) 40 MG capsule TAKE 1 CAPSULE BY MOUTH TWICE A DAY 180 capsule 1  . ondansetron (ZOFRAN) 4 MG tablet Take 1 tablet (4 mg total) by mouth every 8 (eight) hours as needed for nausea or vomiting. 20 tablet 0  . oseltamivir (TAMIFLU) 75 MG capsule Take 1 capsule (75 mg total) by mouth 2 (two) times daily for 5 days. 10 capsule 0  . promethazine (PHENERGAN) 12.5 MG tablet TAKE 1 TABLET (12.5 MG TOTAL) BY MOUTH EVERY 8 (EIGHT) HOURS AS NEEDED FOR NAUSEA OR VOMITING. 20 tablet 0  . rosuvastatin (CRESTOR) 20 MG  tablet TAKE 1 TABLET BY MOUTH EVERY DAY 90 tablet 1  . traZODone (DESYREL) 50 MG tablet TAKE 1-2 TABLETS BY MOUTH NIGHTLY AS NEEDED FOR SLEEP  2   No current facility-administered medications for this visit.      Musculoskeletal: Strength & Muscle Tone: within normal limits Gait & Station: normal Patient leans: N/A  Psychiatric Specialty Exam: Review of Systems  Musculoskeletal: Positive for back pain and joint pain.  Neurological: Positive for tingling.    Blood pressure 140/80, height 6' (1.829 m), weight 221 lb (100.2 kg).Body mass index is 29.97 kg/m.  General Appearance: Casual and in shorts  Eye Contact:  Good  Speech:  Clear and Coherent  Volume:  Normal  Mood:  Anxious and Dysphoric  Affect:  Constricted and Depressed  Thought Process:  Descriptions of Associations: Intact  Orientation:  Full (Time, Place, and Person)  Thought Content: Rumination   Suicidal Thoughts:  No  Homicidal Thoughts:  No  Memory:  Immediate;   Good Recent;   Good Remote;   Good  Judgement:  Good  Insight:  Good  Psychomotor Activity:  Normal  Concentration:  Concentration: Fair and Attention Span: Fair  Recall:  Good  Fund of Knowledge: Good  Language: Good  Akathisia:  No  Handed:  Right  AIMS (if indicated): not done  Assets:  Communication Skills Desire for Improvement Housing Resilience  ADL's:  Intact  Cognition: WNL  Sleep:  Fair   Screenings: PHQ2-9     Office Visit from 02/23/2017 in [place] Office Visit from 09/06/2016 in  [facility]   PHQ-2 Total Score  3  2  PHQ-9 Total Score  17  8       Assessment and Plan: Major depressive disorder, recurrent.  Generalized anxiety disorder.  Patient seen some improvement with the Lamictal.  He has no rash, itching or tremors.  Recommended to try 75 mg for 1 week and then 100 mg daily.  He is getting Cymbalta, Klonopin, BuSpar from his primary care physician Dr. Kalman Jewels.  He is also  prescribed trazodone but he rarely takes it.  We discussed medication side effect specially combination of trazodone and Klonopin can make him very sedated.  I also encouraged to see therapist in our [place] office.  Reminded that if Lamictal caused rash then he need to stop the medication immediately.  Discussed medication side effects and benefits.  Discussed safety concern that anytime having active suicidal thoughts or homicidal thoughts that he need to call 911 or go to local emergency room.  Follow-up in 2 months.   Kathlee Nations, MD 06/27/2018, 8:46 AM

## 2018-06-27 NOTE — Patient Instructions (Signed)
Take remaining Lamictal 25 mg 3 tab daily and once finished than take Lamictal 100 mg daily

## 2018-07-02 ENCOUNTER — Other Ambulatory Visit: Payer: Self-pay | Admitting: Internal Medicine

## 2018-07-02 NOTE — Telephone Encounter (Signed)
He is 3 months OVERDUE FOR 6 MONTH FOLLOW UP.  LAST REFILL WITHOUT AN APPOINTMENT

## 2018-07-08 ENCOUNTER — Other Ambulatory Visit: Payer: Self-pay | Admitting: Internal Medicine

## 2018-07-30 ENCOUNTER — Ambulatory Visit (INDEPENDENT_AMBULATORY_CARE_PROVIDER_SITE_OTHER): Payer: Self-pay | Admitting: Internal Medicine

## 2018-07-30 ENCOUNTER — Telehealth: Payer: Self-pay | Admitting: Internal Medicine

## 2018-07-30 DIAGNOSIS — I10 Essential (primary) hypertension: Secondary | ICD-10-CM

## 2018-07-30 DIAGNOSIS — F411 Generalized anxiety disorder: Secondary | ICD-10-CM

## 2018-07-30 DIAGNOSIS — E669 Obesity, unspecified: Secondary | ICD-10-CM

## 2018-07-30 DIAGNOSIS — M5431 Sciatica, right side: Secondary | ICD-10-CM

## 2018-07-30 DIAGNOSIS — E663 Overweight: Secondary | ICD-10-CM

## 2018-07-30 DIAGNOSIS — E1169 Type 2 diabetes mellitus with other specified complication: Secondary | ICD-10-CM

## 2018-07-30 DIAGNOSIS — E785 Hyperlipidemia, unspecified: Secondary | ICD-10-CM

## 2018-07-30 MED ORDER — ROSUVASTATIN CALCIUM 20 MG PO TABS: 20.0000 mg | ORAL_TABLET | Freq: Every day | ORAL | 1 refills | Status: DC

## 2018-07-30 MED ORDER — LOSARTAN POTASSIUM-HCTZ 100-25 MG PO TABS: 1.0000 | ORAL_TABLET | Freq: Every day | ORAL | 3 refills | Status: DC

## 2018-07-30 MED ORDER — GABAPENTIN 600 MG PO TABS: 600.0000 mg | ORAL_TABLET | Freq: Two times a day (BID) | ORAL | 1 refills | Status: DC

## 2018-07-30 MED ORDER — METOPROLOL TARTRATE 25 MG PO TABS: 25.0000 mg | ORAL_TABLET | Freq: Two times a day (BID) | ORAL | 5 refills | Status: DC

## 2018-07-30 NOTE — Progress Notes (Addendum)
Virtual Visit via  Webex Note  This visit type was conducted due to national recommendations for restrictions regarding the COVID-19 pandemic (e.g. social distancing).  This format is felt to be most appropriate for this patient at this time.  All issues noted in this document were discussed and addressed.  No physical exam was performed (except for noted visual exam findings with Video Visits).   I connected with@ on 07/31/18 at  1:30 PM EDT by a telephone after multiple attempts to connect by [facility] and verified that I am speaking with the correct person using two identifiers. Location patient: home Location provider: work or home office Persons participating in the virtual visit: patient, provider  I discussed the limitations, risks, security and privacy concerns of performing an evaluation and management service by telephone and the availability of in person appointments. I also discussed with the patient that there may be a patient responsible charge related to this service. The patient expressed understanding and agreed to proceed.  Interactive audio and video telecommunications were attempted between this provider and patient, however failed, due to patient having technical difficulties .  We continued and completed visit with audio only.   Reason for visit: medication refill   HPI:  [name] was Last seen in June for follow up on Type  2 DM, hyperlipidemia, hypertension, and obesity with fatty liver .  His last visit was his CPE June 2019   He has relocated with his wife [place] to  [place]. He has had devastating financial losses due in part to losing his job, and in part to the mounting medical bills resulting from his wife's recent  Serious illness as well has her chronic medical issues.  They have simplified their lives acclimated to their new lifestyle.    He has lost 30 lbs intentionally   Needs eye exam.  Has bilateral foot numbness and left arm numbness.  Described as tingling.   Bothers him when he wakes up.  Has burning on top of both feet  with prolonged standing so had to quit working at [employer]. He has seen podiatry and diagnosed with a bone spur on top of great toe left foot  . Taking 900 mg gabapentin bid.  Last surveillance of diabetes was  July 2018 due to a lab error in June 2019. Marland Kitchen He did not return after June 2019 'f lab was incomplete. Marland Kitchen He is not checking blood sugars,  But he states that he has lost 30 lbs intentionally  Lab Results  Component Value Date   HGBA1C 6.0 10/23/2016      discussed letter via Deloris Ping for  labs to be drawn  at [facility].    Lab Results  Component Value Date   HGBA1C 6.0 10/23/2016       History of angioedema  Discussed stopping lisinopril   Seeing Psych for depression and anxiety , aggravated by chronic painow   [facility] for right shoulder pain   [facility] for foot issus  [facility] for sciatica      ROS: See pertinent positives and negatives per HPI.  Past Medical History:  Diagnosis Date  . Anxiety   . Anxiety and depression   . Bruit    L  . Chest pain    hx  . Diabetes mellitus without complication ([place])    "borderline"  . Edema   . Fatty liver   . GERD (gastroesophageal reflux disease)    uses Omeprazole  . Goiter   . HLD (hyperlipidemia)   .  Hypertension    essential, benign  . Hypothyroidism   . Impotence of organic origin   . Murmur   . Other chest pain    tightness, pressure  . Palpitation    hx  . Precordial pain   . Sleep apnea    uses cpap    Past Surgical History:  Procedure Laterality Date  . APPENDECTOMY  2002   done at [facility]  . CARDIAC CATHETERIZATION    . CARDIAC CATHETERIZATION N/A 08/27/2015   Procedure: Left Heart Cath and Coronary Angiography;  Surgeon: Jolaine Artist, MD;  Location: [facility];  Service: Cardiovascular;  Laterality: N/A;  . COLONOSCOPY    . HERNIA REPAIR  6-96-29   umbilical  . JOINT REPLACEMENT     right knee  . REVISION TOTAL KNEE  ARTHROPLASTY Right 06/23/2014   DR Marlou Sa  . TONSILLECTOMY    . TOTAL KNEE ARTHROPLASTY Right 2011   right  . TOTAL KNEE REVISION Right 06/23/2014   Procedure: TOTAL KNEE REVISION;  Surgeon: Meredith Pel, MD;  Location: [facility];  Service: Orthopedics;  Laterality: Right;    Family History  Problem Relation Age of Onset  . Heart disease Father     SOCIAL HX: unemployed   Current Outpatient Medications:  .  acetaminophen-codeine (TYLENOL #3) 300-30 MG tablet, TAKE 1 TAB PO Q8 HOURS, Disp: 40 tablet, Rfl: 0 .  Acetaminophen-Codeine (TYLENOL/CODEINE #3) 300-30 MG tablet, Take 1 tablet by mouth every 8 (eight) hours., Disp: , Rfl:  .  aspirin EC 81 MG tablet, Take 81 mg by mouth daily., Disp: , Rfl:  .  BAYER MICROLET LANCETS lancets, Use as instructed, Disp: 100 each, Rfl: 12 .  busPIRone (BUSPAR) 10 MG tablet, Take 1 tablet (10 mg total) by mouth 3 (three) times daily., Disp: 270 tablet, Rfl: 1 .  Clobetasol Propionate 0.05 % shampoo, Apply to dry scalp and leave on for 15 minutes, Disp: 118 mL, Rfl: 5 .  clonazePAM (KLONOPIN) 0.5 MG tablet, TAKE 1 TABLET BY MOUTH TWICE DAILY, Disp: 60 tablet, Rfl: 0 .  dicyclomine (BENTYL) 20 MG tablet, TAKE 1 TABLET (20 MG TOTAL) BY MOUTH EVERY 6 (SIX) HOURS., Disp: 120 tablet, Rfl: 5 .  DULoxetine (CYMBALTA) 60 MG capsule, TAKE 1 CAPSULE (60 MG TOTAL) BY MOUTH DAILY WITH BREAKFAST., Disp: 90 capsule, Rfl: 1 .  gabapentin (NEURONTIN) 600 MG tablet, Take 1 tablet (600 mg total) by mouth 2 (two) times daily., Disp: 270 tablet, Rfl: 1 .  halobetasol (ULTRAVATE) 0.05 % cream, Apply topically 2 (two) times daily., Disp: 50 g, Rfl: 0 .  ibuprofen (ADVIL,MOTRIN) 200 MG tablet, Take 400 mg by mouth every 6 (six) hours as needed (pain)., Disp: , Rfl:  .  lamoTRIgine (LAMICTAL) 100 MG tablet, Take one tab daily, Disp: 30 tablet, Rfl: 1 .  levothyroxine (SYNTHROID, LEVOTHROID) 88 MCG tablet, TAKE 1 TABLET BY MOUTH ONCE DAILY BEFORE BREAKFAST, Disp: 90 tablet, Rfl:  0 .  losartan-hydrochlorothiazide (HYZAAR) 100-25 MG tablet, Take 1 tablet by mouth daily., Disp: 90 tablet, Rfl: 3 .  methocarbamol (ROBAXIN) 500 MG tablet, TAKE 1 TABLET (500 MG TOTAL) BY MOUTH EVERY 8 (EIGHT) HOURS AS NEEDED., Disp: 90 tablet, Rfl: 2 .  methylPREDNISolone (MEDROL DOSEPAK) 4 MG TBPK tablet, 6 day dose pack - take as directed, Disp: 21 tablet, Rfl: 0 .  metoprolol tartrate (LOPRESSOR) 25 MG tablet, Take 1 tablet (25 mg total) by mouth 2 (two) times daily., Disp: 60 tablet, Rfl: 5 .  Multiple Vitamin (MULTIVITAMIN WITH MINERALS) TABS tablet, Take 1 tablet by mouth daily., Disp: , Rfl:  .  omeprazole (PRILOSEC) 40 MG capsule, Take 1 capsule by mouth twice daily, Disp: 180 capsule, Rfl: 0 .  ondansetron (ZOFRAN) 4 MG tablet, Take 1 tablet (4 mg total) by mouth every 8 (eight) hours as needed for nausea or vomiting., Disp: 20 tablet, Rfl: 0 .  promethazine (PHENERGAN) 12.5 MG tablet, TAKE 1 TABLET (12.5 MG TOTAL) BY MOUTH EVERY 8 (EIGHT) HOURS AS NEEDED FOR NAUSEA OR VOMITING., Disp: 20 tablet, Rfl: 0 .  rosuvastatin (CRESTOR) 20 MG tablet, Take 1 tablet (20 mg total) by mouth daily., Disp: 90 tablet, Rfl: 1 .  traZODone (DESYREL) 50 MG tablet, TAKE 1-2 TABLETS BY MOUTH NIGHTLY AS NEEDED FOR SLEEP, Disp: , Rfl: 2  EXAM:  VITALS per patient if applicable:  GENERAL: alert, oriented, appears well and in no acute distress  HEENT: atraumatic, conjunttiva clear, no obvious abnormalities on inspection of external nose and ears  NECK: normal movements of the head and neck  LUNGS: on inspection no signs of respiratory distress, breathing rate appears normal, no obvious gross SOB, gasping or wheezing  CV: no obvious cyanosis  MS: moves all visible extremities without noticeable abnormality  PSYCH/NEURO: pleasant and cooperative, no obvious depression or anxiety, speech and thought processing grossly intact  ASSESSMENT AND PLAN:  Essential hypertension Well controlled on  lisinopril ,  Given his remote history of angioedema,  changing to losartan.   current regimen per home readings  Renal function stable, no changes today.  Lab Results  Component Value Date   CREATININE 0.89 10/16/2017   Lab Results  Component Value Date   NA 137 10/16/2017   K 3.9 10/16/2017   CL 99 10/16/2017   CO2 27 10/16/2017     Hyperlipidemia associated with type 2 diabetes mellitus He has been lost to follow up and has not had lips since ce June 2019   Lab Results  Component Value Date   CHOL 164 10/16/2017   HDL 44.60 10/16/2017   LDLCALC 91 06/05/2016   LDLDIRECT 101.0 10/16/2017   TRIG 221.0 (H) 10/16/2017   CHOLHDL 4 10/16/2017     Diabetes mellitus type 2 in obese Diagnosed in Oct 2014 with a1c of 7.8.  Has been  well-controlled on diet alone  .   Patient is overdue for follow up and reminded to schedule an annual eye exam .  Patient has no history of microalbuminuria. Patient is tolerating statin therapy for CAD risk reduction and on ACE/ARB for renal protection and hypertension.  Lab Results  Component Value Date   HGBA1C 6.0 10/23/2016   Lab Results  Component Value Date   MICROALBUR 0.8 10/16/2017   Lab Results  Component Value Date   ALT 21 10/16/2017   AST 21 10/16/2017   ALKPHOS 98 10/16/2017   BILITOT 0.4 10/16/2017            Sciatica of right side Symptoms of radiculopathy are intermittent,  With peripheral neuropathy managed with  Gabapentin   Overweight I have congratulated him in reduction of   BMI and encouraged  Continued weight loss with goal of 10% of body weigh over the next 6 months using a low glycemic index diet and regular exercise a minimum of 5 days per week.    Anxiety state Aggravated by significant stressors.  Now seeing a psychiatrist , on multiple medications,  Needing refill on clonazepam.  History of withdrawal when  his refill was late.  Refilled today   But patient was advised to discuss management  With  his  psychiatrist    Updated Medication List Outpatient Encounter Medications as of 07/30/2018  Medication Sig  . acetaminophen-codeine (TYLENOL #3) 300-30 MG tablet TAKE 1 TAB PO Q8 HOURS  . Acetaminophen-Codeine (TYLENOL/CODEINE #3) 300-30 MG tablet Take 1 tablet by mouth every 8 (eight) hours.  Marland Kitchen aspirin EC 81 MG tablet Take 81 mg by mouth daily.  Marland Kitchen BAYER MICROLET LANCETS lancets Use as instructed  . busPIRone (BUSPAR) 10 MG tablet Take 1 tablet (10 mg total) by mouth 3 (three) times daily.  . Clobetasol Propionate 0.05 % shampoo Apply to dry scalp and leave on for 15 minutes  . clonazePAM (KLONOPIN) 0.5 MG tablet TAKE 1 TABLET BY MOUTH TWICE DAILY  . dicyclomine (BENTYL) 20 MG tablet TAKE 1 TABLET (20 MG TOTAL) BY MOUTH EVERY 6 (SIX) HOURS.  . DULoxetine (CYMBALTA) 60 MG capsule TAKE 1 CAPSULE (60 MG TOTAL) BY MOUTH DAILY WITH BREAKFAST.  Marland Kitchen gabapentin (NEURONTIN) 600 MG tablet Take 1 tablet (600 mg total) by mouth 2 (two) times daily.  . halobetasol (ULTRAVATE) 0.05 % cream Apply topically 2 (two) times daily.  Marland Kitchen ibuprofen (ADVIL,MOTRIN) 200 MG tablet Take 400 mg by mouth every 6 (six) hours as needed (pain).  Marland Kitchen lamoTRIgine (LAMICTAL) 100 MG tablet Take one tab daily  . levothyroxine (SYNTHROID, LEVOTHROID) 88 MCG tablet TAKE 1 TABLET BY MOUTH ONCE DAILY BEFORE BREAKFAST  . losartan-hydrochlorothiazide (HYZAAR) 100-25 MG tablet Take 1 tablet by mouth daily.  . methocarbamol (ROBAXIN) 500 MG tablet TAKE 1 TABLET (500 MG TOTAL) BY MOUTH EVERY 8 (EIGHT) HOURS AS NEEDED.  Marland Kitchen methylPREDNISolone (MEDROL DOSEPAK) 4 MG TBPK tablet 6 day dose pack - take as directed  . metoprolol tartrate (LOPRESSOR) 25 MG tablet Take 1 tablet (25 mg total) by mouth 2 (two) times daily.  . Multiple Vitamin (MULTIVITAMIN WITH MINERALS) TABS tablet Take 1 tablet by mouth daily.  Marland Kitchen omeprazole (PRILOSEC) 40 MG capsule Take 1 capsule by mouth twice daily  . ondansetron (ZOFRAN) 4 MG tablet Take 1 tablet (4 mg total) by mouth  every 8 (eight) hours as needed for nausea or vomiting.  . promethazine (PHENERGAN) 12.5 MG tablet TAKE 1 TABLET (12.5 MG TOTAL) BY MOUTH EVERY 8 (EIGHT) HOURS AS NEEDED FOR NAUSEA OR VOMITING.  . rosuvastatin (CRESTOR) 20 MG tablet Take 1 tablet (20 mg total) by mouth daily.  . traZODone (DESYREL) 50 MG tablet TAKE 1-2 TABLETS BY MOUTH NIGHTLY AS NEEDED FOR SLEEP  . [DISCONTINUED] gabapentin (NEURONTIN) 600 MG tablet gabapentin 600 mg tablet  . [DISCONTINUED] lisinopril-hydrochlorothiazide (PRINZIDE,ZESTORETIC) 20-25 MG tablet TAKE 1 TABLET BY MOUTH EVERY DAY  . [DISCONTINUED] metoprolol tartrate (LOPRESSOR) 25 MG tablet TAKE 1 TABLET BY MOUTH TWICE A DAY  . [DISCONTINUED] rosuvastatin (CRESTOR) 20 MG tablet TAKE 1 TABLET BY MOUTH EVERY DAY   No facility-administered encounter medications on file as of 07/30/2018.

## 2018-07-31 NOTE — Assessment & Plan Note (Addendum)
He has been lost to follow up and has not had lips since ce June 2019   Lab Results  Component Value Date   CHOL 164 10/16/2017   HDL 44.60 10/16/2017   LDLCALC 91 06/05/2016   LDLDIRECT 101.0 10/16/2017   TRIG 221.0 (H) 10/16/2017   CHOLHDL 4 10/16/2017

## 2018-07-31 NOTE — Assessment & Plan Note (Signed)
Aggravated by significant stressors.  Now seeing a psychiatrist , on multiple medications,  Needing refill on clonazepam.  History of withdrawal when his refill was late.  Refilled today   But patient was advised to discuss management  With  his psychiatrist

## 2018-07-31 NOTE — Assessment & Plan Note (Signed)
Symptoms of radiculopathy are intermittent,  With peripheral neuropathy managed with  Gabapentin

## 2018-07-31 NOTE — Assessment & Plan Note (Addendum)
Well controlled on lisinopril ,  Given his remote history of angioedema,  changing to losartan.   current regimen per home readings  Renal function stable, no changes today.  Lab Results  Component Value Date   CREATININE 0.89 10/16/2017   Lab Results  Component Value Date   NA 137 10/16/2017   K 3.9 10/16/2017   CL 99 10/16/2017   CO2 27 10/16/2017

## 2018-07-31 NOTE — Assessment & Plan Note (Signed)
Diagnosed in Oct 2014 with a1c of 7.8.  Has been  well-controlled on diet alone  .   Patient is overdue for follow up and reminded to schedule an annual eye exam .  Patient has no history of microalbuminuria. Patient is tolerating statin therapy for CAD risk reduction and on ACE/ARB for renal protection and hypertension.  Lab Results  Component Value Date   HGBA1C 6.0 10/23/2016   Lab Results  Component Value Date   MICROALBUR 0.8 10/16/2017   Lab Results  Component Value Date   ALT 21 10/16/2017   AST 21 10/16/2017   ALKPHOS 98 10/16/2017   BILITOT 0.4 10/16/2017

## 2018-07-31 NOTE — Assessment & Plan Note (Signed)
I have congratulated him in reduction of   BMI and encouraged  Continued weight loss with goal of 10% of body weigh over the next 6 months using a low glycemic index diet and regular exercise a minimum of 5 days per week.   

## 2018-08-05 ENCOUNTER — Other Ambulatory Visit: Payer: Self-pay | Admitting: Internal Medicine

## 2018-08-06 NOTE — Telephone Encounter (Signed)
Last OV 07/30/2018  Last refilled 07/02/2018 disp 60 with no refills   Next OV none scheduled   Sent to PCP for approval

## 2018-08-07 ENCOUNTER — Other Ambulatory Visit: Payer: Self-pay | Admitting: Internal Medicine

## 2018-08-16 ENCOUNTER — Encounter (INDEPENDENT_AMBULATORY_CARE_PROVIDER_SITE_OTHER): Payer: Self-pay | Admitting: Orthopedic Surgery

## 2018-08-16 ENCOUNTER — Ambulatory Visit (INDEPENDENT_AMBULATORY_CARE_PROVIDER_SITE_OTHER): Payer: PRIVATE HEALTH INSURANCE

## 2018-08-16 ENCOUNTER — Other Ambulatory Visit: Payer: Self-pay

## 2018-08-16 ENCOUNTER — Ambulatory Visit (INDEPENDENT_AMBULATORY_CARE_PROVIDER_SITE_OTHER): Payer: PRIVATE HEALTH INSURANCE | Admitting: Orthopedic Surgery

## 2018-08-16 DIAGNOSIS — M545 Low back pain, unspecified: Secondary | ICD-10-CM

## 2018-08-16 DIAGNOSIS — M542 Cervicalgia: Secondary | ICD-10-CM

## 2018-08-16 DIAGNOSIS — M25562 Pain in left knee: Secondary | ICD-10-CM

## 2018-08-16 NOTE — Progress Notes (Signed)
Office Visit Note   Patient: [name]           Date of Birth: 1966-01-09           MRN: [id number] Visit Date: 08/16/2018 Requested by: [name], MD [place], [place] [address] PCP: [name], MD  Subjective: No chief complaint on file.   HPI: [name] is a patient with multiple orthopedic issues.  He describes bilateral shoulder pain right worse than left.  He has had prior surgery on the right shoulder done is a Workmen's Comp. case.  He also describes neck pain and radicular left arm pain.  This is been going on for several months.  Describes dorsal sided hand numbness as well as weakness in that left arm.  He also describes left knee pain.  Has had right total knee revision done.  The left knee bothers him but he also describes shooting radicular pain down the leg into the foot.  Does have a history of lumbar disc problems 10 years ago and had epidural steroid injections at that time.  MRI scan from over a year ago does show left-sided transforaminal pathology at L3-4 as well as right-sided pathology.  He has daily symptoms in that left leg.  He also describes some mechanical symptoms in that left knee.              ROS: All systems reviewed are negative as they relate to the chief complaint within the history of present illness.  Patient denies  fevers or chills.   Assessment & Plan: Visit Diagnoses:  1. Low back pain, unspecified back pain laterality, unspecified chronicity, unspecified whether sciatica present   2. Left knee pain, unspecified chronicity   3. Neck pain   4. Cervicalgia     Plan: Impression is multiple orthopedic complaints.  Patient currently is not working at [employer].  I think he may have a radicular problem in that left leg coming from the back.  May also have some degenerative early arthritis or meniscal pathology in the left knee.  Left knee is injected today.  I think based on his exam and history that MRI scan with  epidural steroid injections to follow is indicated.  Regarding the neck and arm I think this is also cervical radiculopathy.  Does not have any frank weakness but is having daily pain and is been going on for several months.  Plan MRI scan lumbar spine evaluate left-sided radiculopathy.  Plan MRI cervical spine evaluate left-sided radiculopathy.  Form 1 injection into the knee performed today.  He is taking over-the-counter medication without much relief.  Follow-Up Instructions: Return for after MRI.   Orders:  Orders Placed This Encounter  Procedures  . XR Lumbar Spine 2-3 Views  . XR Knee 1-2 Views Left  . XR Cervical Spine 2 or 3 views  . MR Lumbar Spine w/o contrast  . MR Cervical Spine w/o contrast   No orders of the defined types were placed in this encounter.     Procedures: No procedures performed   Clinical Data: No additional findings.  Objective: Vital Signs: There were no vitals taken for this visit.  Physical Exam:   Constitutional: Patient appears well-developed HEENT:  Head: Normocephalic Eyes:EOM are normal Neck: Normal range of motion Cardiovascular: Normal rate Pulmonary/chest: Effort normal Neurologic: Patient is alert Skin: Skin is warm Psychiatric: Patient has normal mood and affect    Ortho Exam: Ortho exam demonstrates mild left knee  effusion.  Right knee has good range of motion with no effusion.  Does have positive nerve root tension signs on the left negative on the right.  Also has paresthesias in the L4-5 distribution left versus right.  Pedal pulses palpable.  No masses lymphadenopathy or skin changes noted in that bilateral lower extremity region.  Does have some pain with forward lateral bending.  Neck has pretty good range of motion.  Does have some weakness to rotator cuff testing on the right compared to the left.  He has stable collateral crucial ligaments in the left knee with intact extensor mechanism.  No paresthesias L1 S1 bilaterally.   On that cervical spine he does have some paresthesias in that C5 distribution on the left compared to the right with negative Tinel's cubital tunnel in the elbow bilaterally.  Specialty Comments:  No specialty comments available.  Imaging: Xr Knee 1-2 Views Left  Result Date: 08/16/2018 AP lateral left knee reviewed.  Mild left knee degenerative joint disease is present primarily on the medial compartment.  No bone-on-bone changes are noted.  No fracture or dislocation present.  Xr Cervical Spine 2 Or 3 Views  Result Date: 08/16/2018 AP lateral cervical spine reviewed.  Facet arthritis is noted on the AP view in the mid cervical region.  Normal lordosis is present.  No significant degenerative disc disease and joint space narrowing is present between the vertebral bodies.  Xr Lumbar Spine 2-3 Views  Result Date: 08/16/2018 AP lateral lumbar spine reviewed.  Mild scoliosis is present.  No spondylolisthesis or compression fractures.  Mild lower lumbar facet arthritis is noted but fairly minimal degenerative disc disease between the lumbar vertebral bodies is present.    PMFS History: Patient Active Problem List   Diagnosis Date Noted  . Angioedema of lips 11/26/2016  . Cognitive complaints with normal neuropsychological exam 10/23/2016  . 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 Hypothyroidism due to acquired atrophy of thyroid 09/14/2015  . Arthritis of knee 06/23/2014  . Encounter for preventive health examination 06/17/2014  . Enlarged RV (right ventricle) 06/17/2014  . Cervical spine degeneration 02/17/2014  . Overweight 10/29/2013  . Chronic knee pain 10/29/2013  . Diabetes mellitus type 2 in obese ([place]) 10/23/2012  . Seborrheic dermatitis, unspecified 05/17/2012  . Fatty liver 04/22/2012  . Sciatica of right side 12/24/2010  . Hyperlipidemia associated with  type 2 diabetes mellitus ([place]) 01/13/2009  . Anxiety state 01/13/2009  . Essential hypertension 01/13/2009  . OSA (obstructive sleep apnea) 01/13/2009   Past Medical History:  Diagnosis Date  . Anxiety   . Anxiety and depression   . Bruit    L  . Chest pain    hx  . Diabetes mellitus without complication ([facility])    "borderline"  . Edema   . Fatty liver   . GERD (gastroesophageal reflux disease)    uses Omeprazole  . Goiter   . HLD (hyperlipidemia)   . Hypertension    essential, benign  . Hypothyroidism   . Impotence of organic origin   . Murmur   . Other chest pain    tightness, pressure  . Palpitation    hx  . Precordial pain   . Sleep apnea    uses cpap    Family History  Problem Relation Age of Onset  . Heart disease Father     Past Surgical History:  Procedure Laterality  Date  . APPENDECTOMY  2002   done at [facility]  . CARDIAC CATHETERIZATION    . CARDIAC CATHETERIZATION N/A 08/27/2015   Procedure: Left Heart Cath and Coronary Angiography;  Surgeon: Jolaine Artist, MD;  Location: [facility];  Service: Cardiovascular;  Laterality: N/A;  . COLONOSCOPY    . HERNIA REPAIR  4-81-85   umbilical  . JOINT REPLACEMENT     right knee  . REVISION TOTAL KNEE ARTHROPLASTY Right 06/23/2014   DR Marlou Sa  . TONSILLECTOMY    . TOTAL KNEE ARTHROPLASTY Right 2011   right  . TOTAL KNEE REVISION Right 06/23/2014   Procedure: TOTAL KNEE REVISION;  Surgeon: Meredith Pel, MD;  Location: [facility];  Service: Orthopedics;  Laterality: Right;   Social History   Occupational History  . Occupation: IT trainer  . Occupation: SERVICE DIRECTOR    Employer: [employer]  Tobacco Use  . Smoking status: Former Smoker    Packs/day: 2.00    Years: 18.00    Pack years: 36.00    Types: Cigarettes    Last attempt to quit: 04/24/2005    Years since quitting: 13.3  . Smokeless tobacco: Never Used  . Tobacco comment: quit 20  years ago   Substance and Sexual Activity  . Alcohol use: Yes    Comment: occasional  . Drug use: No  . Sexual activity: Yes

## 2018-08-20 ENCOUNTER — Other Ambulatory Visit: Payer: Self-pay | Admitting: Internal Medicine

## 2018-08-20 NOTE — Telephone Encounter (Signed)
Patient should be taking losartan,  Sent in to pharmacy on April 7th,  Lisinopril was stopped ,

## 2018-08-20 NOTE — Telephone Encounter (Signed)
Refill request for lisinopril-hctz.

## 2018-08-26 IMAGING — CT CT ABD-PELV W/ CM
2 of 5 series · 15 of 46 positions shown, 17 images · IV contrast (APPLIED)
Comparison: Abdominal ultrasound dated 11/20/2016 and CT dated
05/02/2016

CLINICAL DATA: 51-year-old male with generalized abdominal pain and
nausea.

EXAM:
CT ABDOMEN AND PELVIS WITH CONTRAST
TECHNIQUE: Multidetector CT imaging of the abdomen and pelvis was performed
using the standard protocol following bolus administration of
intravenous contrast.
CONTRAST:  100mL 4C0WLN-M55 IOPAMIDOL ([id number]) INJECTION 61%

[Series 2: routine abd/pel with · axial · 0.79mm/px · z∈[-542,-62]mm · 12 of 110 slices shown, 14 images]
[im 7/110  soft-tissue]
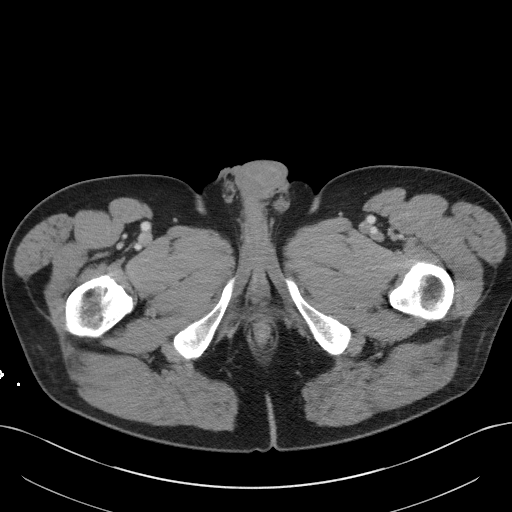
[im 7/110  bone]
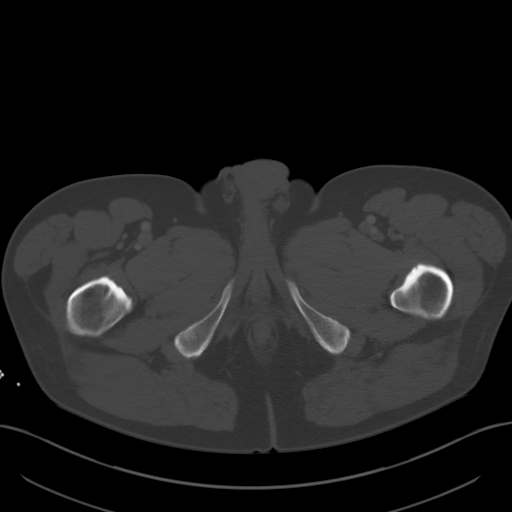
[im 19/110  soft-tissue]
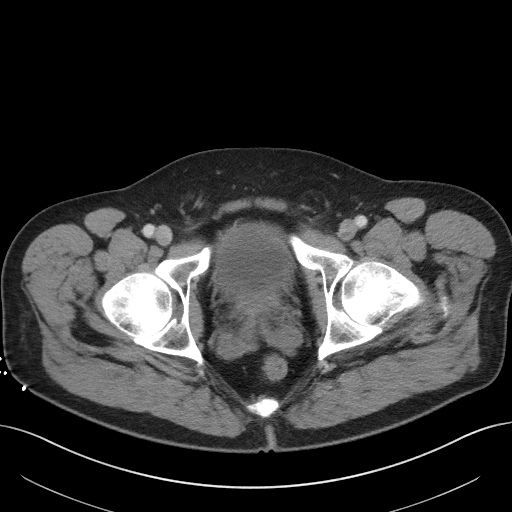
[im 25/110  soft-tissue]
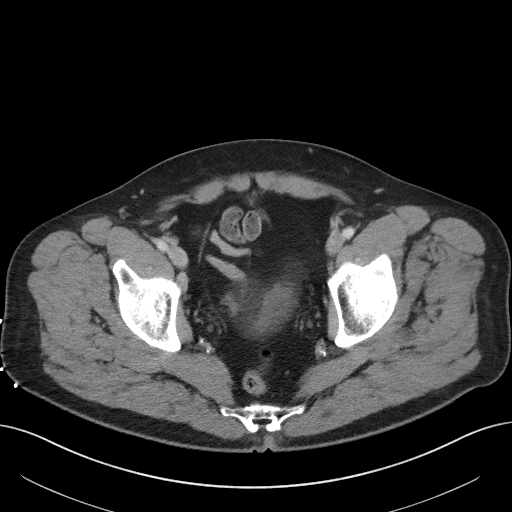
[im 31/110  soft-tissue]
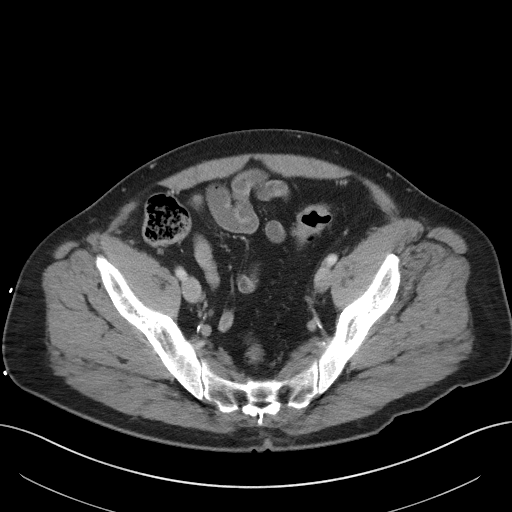
[im 43/110  soft-tissue]
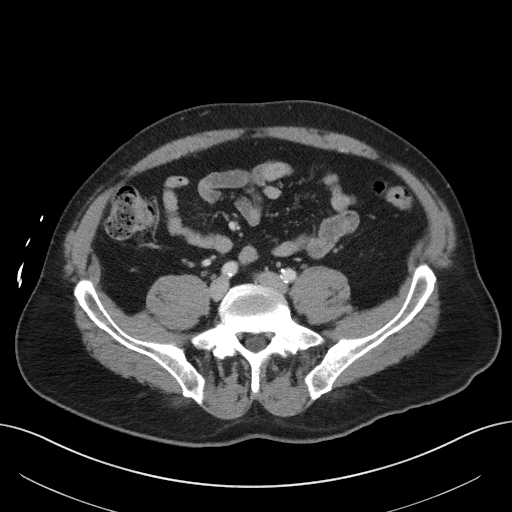
[im 49/110  soft-tissue]
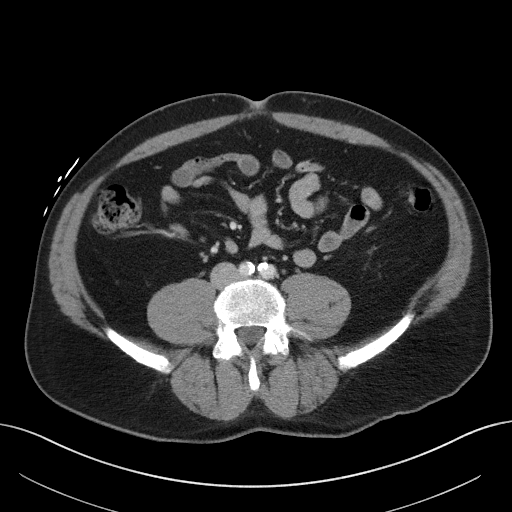
[im 61/110  soft-tissue]
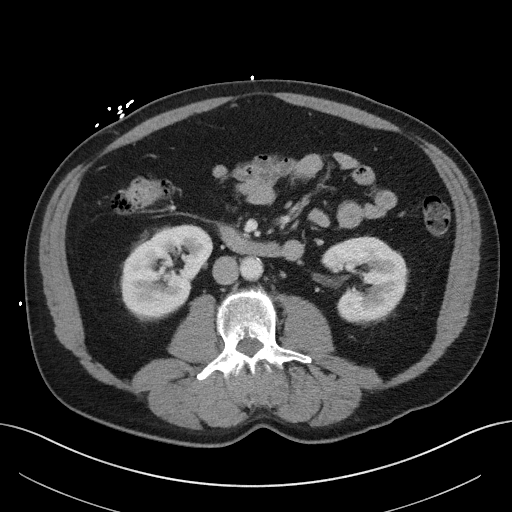
[im 67/110  soft-tissue]
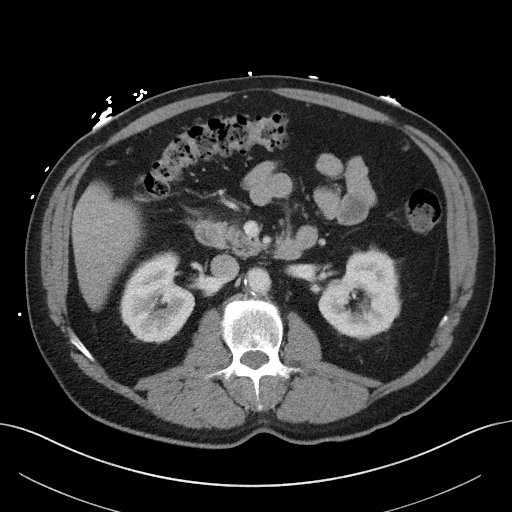
[im 79/110  soft-tissue]
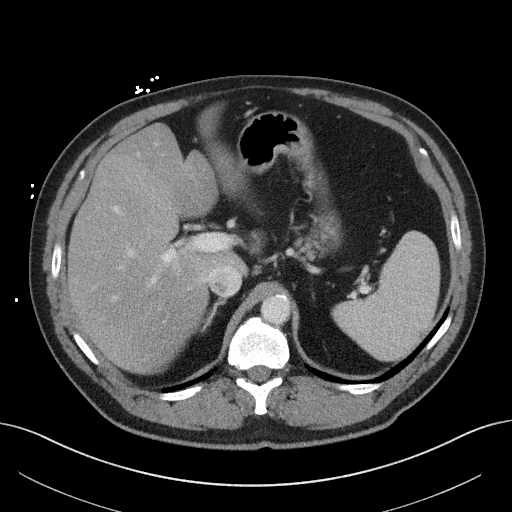
[im 79/110  bone]
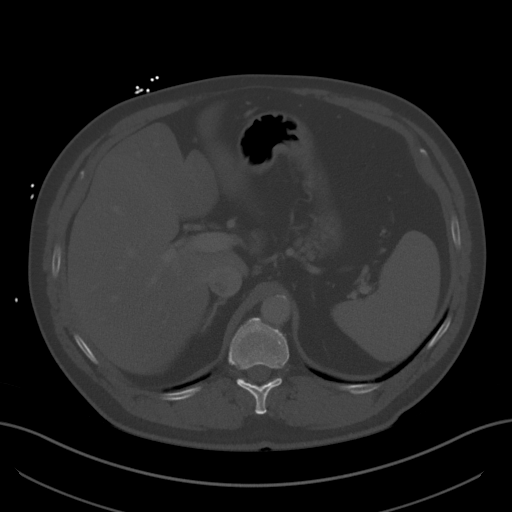
[im 85/110  soft-tissue]
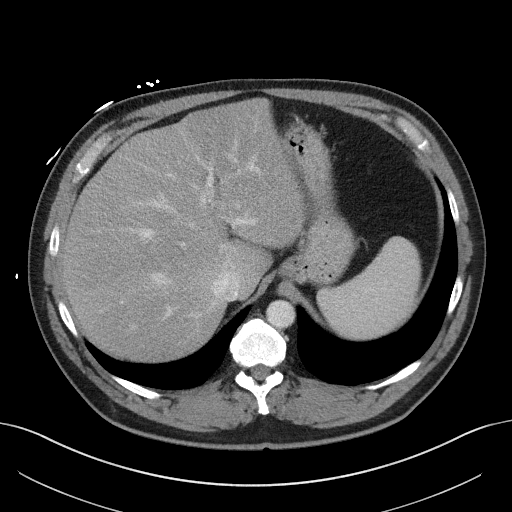
[im 91/110  soft-tissue]
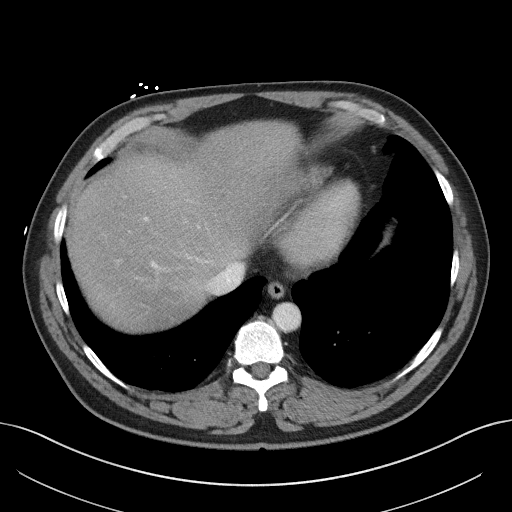
[im 103/110  soft-tissue]
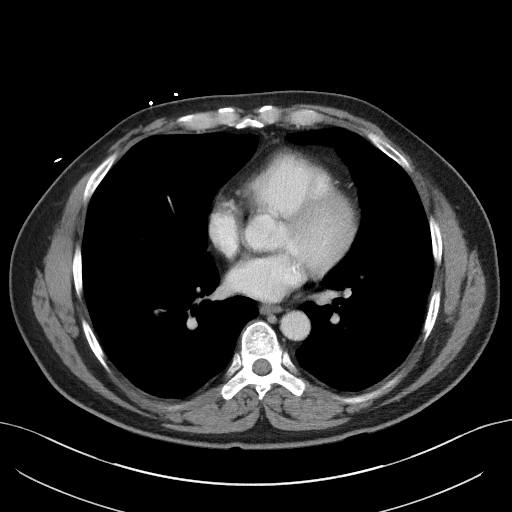

[Series 5: coronal st · coronal · 0.86mm/px · 3 of 97 slices shown]
[im 33/97  soft-tissue]
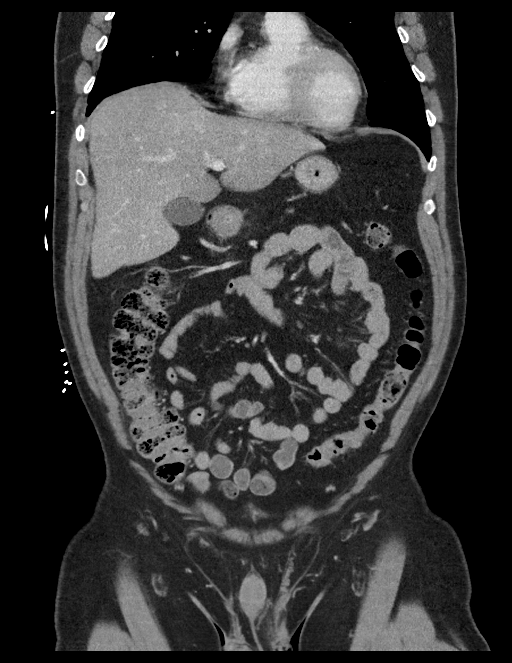
[im 43/97  soft-tissue]
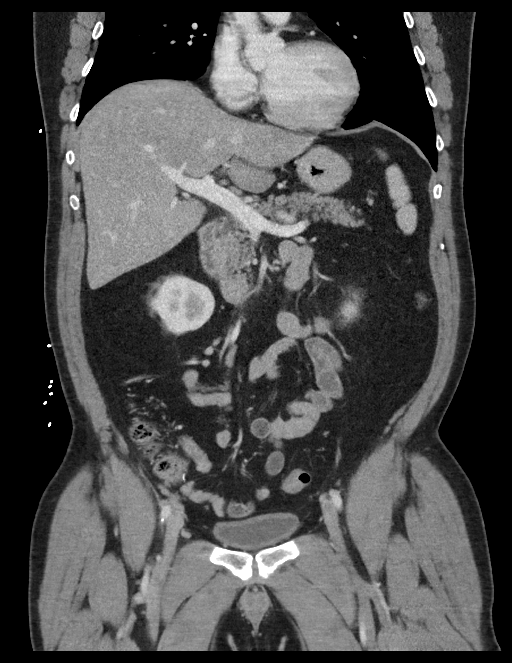
[im 54/97  soft-tissue]
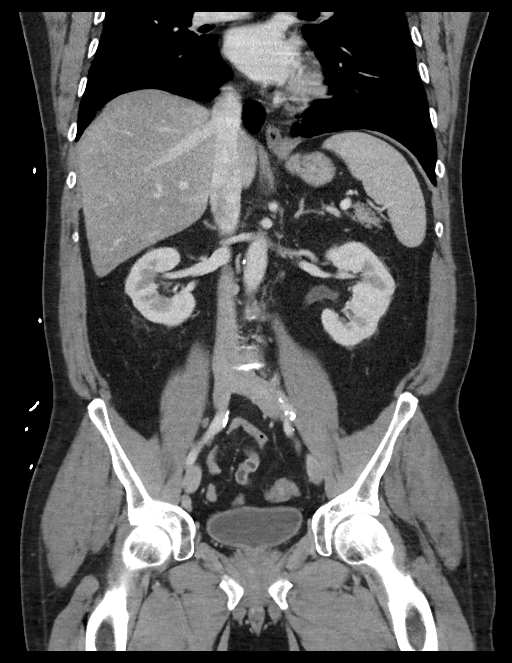

[15 of 46 positions shown; findings below may reference images not displayed]

FINDINGS: Lower chest: The visualized lung bases are clear.

No intra-abdominal free air or free fluid.

Hepatobiliary: Mild diffuse fatty infiltration of the liver. No
intrahepatic biliary ductal dilatation. The gallbladder is
unremarkable.

Pancreas: The pancreas is unremarkable. Mild hazy appearance of the
fat adjacent to the uncinate process of the pancreas likely related
to inflammatory changes of the second portion of the duodenum. Acute
pancreatitis as the primary cause of the inflammatory process is
less likely but not entirely excluded. Correlation with pancreatic
enzymes recommended. There is no peripancreatic fluid collection or
abscess.

Spleen: Normal in size without focal abnormality.

Adrenals/Urinary Tract: Adrenal glands are unremarkable. Kidneys are
normal, without renal calculi, focal lesion, or hydronephrosis.
Bladder is unremarkable.

Stomach/Bowel: There is mild inflammatory changes of the second
portion of the duodenum concerning for duodenitis. Clinical
correlation is recommended. Small scattered colonic diverticula
without active inflammatory changes noted. There is moderate stool
throughout the colon. No evidence of bowel obstruction. There is
fecalization of multiple loops of distal small bowel which may
represent increased transit time or small intestinal bacterial
overgrowth. Appendectomy.

Vascular/Lymphatic: There is moderate aortoiliac atherosclerotic
disease. The origins of the celiac axis, SMA, IMA and the renal
arteries are patent. The SMV, splenic vein, and main portal vein are
patent. No portal venous gas identified. There is no adenopathy.

Reproductive: The prostate and seminal vesicles are grossly
unremarkable. No pelvic mass.

Other: Small fat containing bilateral inguinal hernia.

Musculoskeletal: No acute or significant osseous findings.
IMPRESSION: 1. Mild inflammatory changes abutting the second portion of the
duodenum most consistent with do the 90s. An early acute
pancreatitis as the primary cause of the inflammation is less likely
but not excluded. Correlation with pancreatic enzymes recommended.
2. Moderate colonic stool burden.  No bowel obstruction.
3. Fatty liver.
4.  Aortic Atherosclerosis ([id number].I).

## 2018-08-29 ENCOUNTER — Other Ambulatory Visit: Payer: Self-pay

## 2018-08-29 ENCOUNTER — Ambulatory Visit (INDEPENDENT_AMBULATORY_CARE_PROVIDER_SITE_OTHER): Payer: PRIVATE HEALTH INSURANCE | Admitting: Psychiatry

## 2018-08-29 ENCOUNTER — Encounter (HOSPITAL_COMMUNITY): Payer: Self-pay | Admitting: Psychiatry

## 2018-08-29 DIAGNOSIS — F331 Major depressive disorder, recurrent, moderate: Secondary | ICD-10-CM

## 2018-08-29 DIAGNOSIS — F411 Generalized anxiety disorder: Secondary | ICD-10-CM | POA: Diagnosis not present

## 2018-08-29 MED ORDER — LAMOTRIGINE 150 MG PO TABS: ORAL_TABLET | ORAL | 2 refills | Status: DC

## 2018-08-29 NOTE — Progress Notes (Signed)
Virtual Visit via Telephone Note  I connected with [name] on 08/29/18 at 10:40 AM EDT by telephone and verified that I am speaking with the correct person using two identifiers.   I discussed the limitations, risks, security and privacy concerns of performing an evaluation and management service by telephone and the availability of in person appointments. I also discussed with the patient that there may be a patient responsible charge related to this service. The patient expressed understanding and agreed to proceed.   History of Present Illness: Patient was evaluated through phone session.  He continues to struggle with chronic anxiety and depression.  On his last visit we increase Lamictal 200 mg.  He admitted some help and crying spells but still feels sometimes hopeless helpless and sad.  His wife fell again and he is now taking care of his wife.  He does not leave the house.  He admitted lack of motivation and desire to do anything.  He recently filed for disability through the lawyer.  Patient has multiple health issues including back pain, knee pain, diabetes.  He is getting BuSpar, clonazepam, Cymbalta and gabapentin from his primary care physician Dr. Kalman Jewels.  Recently all his medications were renewed.  He is tolerating Lamictal and reported no rash or itching.  Though he denies any suicidal thoughts or homicidal thought but admitted sometimes feel hopeless and helpless.  He endorsed lack of energy.  We have recommended to see a therapist but due to taking care of his wife he is unable to schedule appointment.  Patient denies drinking or using any illegal substances.  He is trying to lose weight.  He reported his appetite is okay.    Past Psychiatric History: Reviewed. H/O depression and anxiety.  No h/o suicidal attempt or inpatient treatment.  Seen Dr. Jake Michaelis and given Zoloft, Klonopin and Cymbalta.  Later PCP added BuSpar and trazodone.    Observations/Objective: Mental status  examination done on the phone.  Patient describes his mood sad and anxious.  His speech is slow, soft with clear and coherent.  There were no flight of ideas or loose association.  His attention and concentration is fair.  He denies any suicidal thoughts or homicidal thought but admitted depressive thoughts.  He denies any auditory or visual hallucination.  There were no delusions, paranoia or any grandiosity.  He is alert and oriented x3.  His fund of knowledge is average.  His cognition is intact.  His insight judgment is fair.  Assessment and Plan: Major depressive disorder, recurrent.  Generalized anxiety disorder.  I reviewed his current medication.  He is taking BuSpar, Klonopin, Cymbalta from his primary care physician and Neurontin from his pain specialist.  Patient has multiple health issues including tingling and numbness.  He is tolerating Lamictal.  I recommend to try Lamictal 150 mg to help his residual mood lability and depression.  At this time patient is unable to see a therapist as he is taking care of his wife.  He has filed for disability through the [employer].  I told if his lawyer contact us to provide your records with your consent we will happy to send the records.  Discussed medication side effects and benefits specially if you develop a rash with the Lamictal then he should stop the medicine immediately and call us.  Recommended to call us back if he has any concerns or worsening of the symptom.  Follow-up in 3 months.    Follow Up Instructions:  I discussed the assessment and treatment plan with the patient. The patient was provided an opportunity to ask questions and all were answered. The patient agreed with the plan and demonstrated an understanding of the instructions.   The patient was advised to call back or seek an in-person evaluation if the symptoms worsen or if the condition fails to improve as anticipated.  I provided 20 minutes of non-face-to-face time during this  encounter.   Kathlee Nations, MD

## 2018-08-31 ENCOUNTER — Other Ambulatory Visit (HOSPITAL_COMMUNITY): Payer: Self-pay | Admitting: Psychiatry

## 2018-08-31 DIAGNOSIS — F411 Generalized anxiety disorder: Secondary | ICD-10-CM

## 2018-09-02 ENCOUNTER — Other Ambulatory Visit (HOSPITAL_COMMUNITY): Payer: Self-pay | Admitting: Psychiatry

## 2018-09-02 DIAGNOSIS — F411 Generalized anxiety disorder: Secondary | ICD-10-CM

## 2018-09-02 NOTE — Telephone Encounter (Signed)
Prescribed by his primary care physician.  Please call his PCP for refills.

## 2018-09-04 ENCOUNTER — Other Ambulatory Visit: Payer: Self-pay | Admitting: Internal Medicine

## 2018-09-04 DIAGNOSIS — F411 Generalized anxiety disorder: Secondary | ICD-10-CM

## 2018-09-04 MED ORDER — DULOXETINE HCL 60 MG PO CPEP: 60.0000 mg | ORAL_CAPSULE | Freq: Every day | ORAL | 1 refills | Status: DC

## 2018-09-04 NOTE — Telephone Encounter (Signed)
Refill sent.

## 2018-09-04 NOTE — Telephone Encounter (Signed)
Copied from [place] [id number]. Topic: Quick Communication - Rx Refill/Question >> Sep 04, 2018  1:07 PM Waylan Rocher, [place] L wrote: Medication: DULoxetine (CYMBALTA) 60 MG capsule (pills finished on Friday 09/06/18)  Has the patient contacted their pharmacy? {yes (Agent: If no, request that the patient contact the pharmacy for the refill.) (Agent: If yes, when and what did the pharmacy advise?) Pharmacy faxed on 09/02/2018  Preferred Pharmacy (with phone number or street name): [place], [place] [address] Phone: [phone] Fax: [phone]  Agent: Please be advised that RX refills may take up to 3 business days. We ask that you follow-up with your pharmacy.

## 2018-09-11 ENCOUNTER — Ambulatory Visit (INDEPENDENT_AMBULATORY_CARE_PROVIDER_SITE_OTHER): Payer: PRIVATE HEALTH INSURANCE | Admitting: Orthopedic Surgery

## 2018-09-11 ENCOUNTER — Encounter: Payer: Self-pay | Admitting: Orthopedic Surgery

## 2018-09-11 ENCOUNTER — Telehealth: Payer: Self-pay

## 2018-09-11 ENCOUNTER — Other Ambulatory Visit: Payer: Self-pay

## 2018-09-11 DIAGNOSIS — S838X2D Sprain of other specified parts of left knee, subsequent encounter: Secondary | ICD-10-CM | POA: Diagnosis not present

## 2018-09-11 MED ORDER — METHYLPREDNISOLONE ACETATE 40 MG/ML IJ SUSP
40.0000 mg | INTRAMUSCULAR | Status: AC | PRN
  Administered 2018-09-11: 40 mg via INTRA_ARTICULAR

## 2018-09-11 MED ORDER — BUPIVACAINE HCL 0.25 % IJ SOLN
4.0000 mL | INTRAMUSCULAR | Status: AC | PRN
  Administered 2018-09-11: 4 mL via INTRA_ARTICULAR

## 2018-09-11 MED ORDER — LIDOCAINE HCL 1 % IJ SOLN
5.0000 mL | INTRAMUSCULAR | Status: AC | PRN
  Administered 2018-09-11: 5 mL

## 2018-09-11 NOTE — Progress Notes (Signed)
Office Visit Note   Patient: [name]           Date of Birth: Jun 11, 1965           MRN: [id number] Visit Date: 09/11/2018 Requested by: [name], MD [place], [place] [address] PCP: [name], MD  Subjective: Chief Complaint  Patient presents with  . Left Knee - Pain    HPI: [name] is a 53 year old patient with left knee pain of 2 months duration.  Denies any history of injury but does report primarily medial sided pain.  He has had right total knee revised.  Currently he is not working.  Denies any fevers or chills or history of gout or pseudogout.  Reports some mechanical symptoms.  States that he is almost requiring a cane to walk.  Has been taking over-the-counter medications without relief.              ROS: All systems reviewed are negative as they relate to the chief complaint within the history of present illness.  Patient denies  fevers or chills.   Assessment & Plan: Visit Diagnoses:  1. Injury of meniscus of left knee, subsequent encounter     Plan: Impression is left knee pain with effusion and medial joint line tenderness.  Radiographs from April show only mild medial joint space narrowing.  I think at this time because of his long right knee history it would be advisable to isolate the pathology in the left knee does make sure that it is something which if we can treat we should treat.  I will see him back after that study.  Also aspiration injection performed today for pain relief.  Follow-Up Instructions: Return for after MRI.   Orders:  No orders of the defined types were placed in this encounter.  No orders of the defined types were placed in this encounter.     Procedures: Large Joint Inj: L knee on 09/11/2018 8:53 AM Indications: diagnostic evaluation, joint swelling and pain Details: 18 G 1.5 in needle, superolateral approach  Arthrogram: No  Medications: 5 mL lidocaine 1 %; 40 mg methylPREDNISolone  acetate 40 MG/ML; 4 mL bupivacaine 0.25 % Aspirate: 40 mL yellow Outcome: tolerated well, no immediate complications Procedure, treatment alternatives, risks and benefits explained, specific risks discussed. Consent was given by the patient. Immediately prior to procedure a time out was called to verify the correct patient, procedure, equipment, support staff and site/side marked as required. Patient was prepped and draped in the usual sterile fashion.       Clinical Data: No additional findings.  Objective: Vital Signs: There were no vitals taken for this visit.  Physical Exam:   Constitutional: Patient appears well-developed HEENT:  Head: Normocephalic Eyes:EOM are normal Neck: Normal range of motion Cardiovascular: Normal rate Pulmonary/chest: Effort normal Neurologic: Patient is alert Skin: Skin is warm Psychiatric: Patient has normal mood and affect    Ortho Exam: Ortho exam demonstrates full active and passive range of motion of that left knee but with medial joint line tenderness and palpable Baker's cyst.  Extensor mechanism is intact.  Collateral and cruciate ligaments are stable.  No groin pain with internal X rotation of the leg.  Specialty Comments:  No specialty comments available.  Imaging: No results found.   PMFS History: Patient Active Problem List   Diagnosis Date Noted  . Angioedema of lips 11/26/2016  . Cognitive complaints with normal neuropsychological exam 10/23/2016  . 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 Hypothyroidism due to acquired atrophy of thyroid 09/14/2015  . Arthritis of knee 06/23/2014  . Encounter for preventive health examination 06/17/2014  . Enlarged RV (right ventricle) 06/17/2014  . Cervical spine degeneration 02/17/2014  . Overweight 10/29/2013  . Chronic knee pain 10/29/2013  . Diabetes mellitus type  2 in obese ([place]) 10/23/2012  . Seborrheic dermatitis, unspecified 05/17/2012  . Fatty liver 04/22/2012  . Sciatica of right side 12/24/2010  . Hyperlipidemia associated with type 2 diabetes mellitus ([place]) 01/13/2009  . Anxiety state 01/13/2009  . Essential hypertension 01/13/2009  . OSA (obstructive sleep apnea) 01/13/2009   Past Medical History:  Diagnosis Date  . Anxiety   . Anxiety and depression   . Bruit    L  . Chest pain    hx  . Diabetes mellitus without complication ([place])    "borderline"  . Edema   . Fatty liver   . GERD (gastroesophageal reflux disease)    uses Omeprazole  . Goiter   . HLD (hyperlipidemia)   . Hypertension    essential, benign  . Hypothyroidism   . Impotence of organic origin   . Murmur   . Other chest pain    tightness, pressure  . Palpitation    hx  . Precordial pain   . Sleep apnea    uses cpap    Family History  Problem Relation Age of Onset  . Heart disease Father     Past Surgical History:  Procedure Laterality Date  . APPENDECTOMY  2002   [facility] at [facility]  . CARDIAC CATHETERIZATION    . CARDIAC CATHETERIZATION N/A 08/27/2015   Procedure: Left Heart Cath and Coronary Angiography;  Surgeon: Jolaine Artist, MD;  Location: [facility];  Service: Cardiovascular;  Laterality: N/A;  . COLONOSCOPY    . HERNIA REPAIR  [id number]   umbilical  . JOINT REPLACEMENT     right knee  . REVISION TOTAL KNEE ARTHROPLASTY Right 06/23/2014   DR Marlou Sa  . TONSILLECTOMY    . TOTAL KNEE ARTHROPLASTY Right 2011   right  . TOTAL KNEE REVISION Right 06/23/2014   Procedure: TOTAL KNEE REVISION;  Surgeon: Meredith Pel, MD;  Location: [facility];  Service: Orthopedics;  Laterality: Right;   Social History   Occupational History  . Occupation: IT trainer  . Occupation: SERVICE DIRECTOR    Employer: [employer]  Tobacco Use  . Smoking status: Former Smoker    Packs/day: 2.00    Years:  18.00    Pack years: 36.00    Types: Cigarettes    Last attempt to quit: 04/24/2005    Years since quitting: 13.3  . Smokeless tobacco: Never Used  . Tobacco comment: quit 20 years ago   Substance and Sexual Activity  . Alcohol use: Yes    Comment: occasional  . Drug use: No  . Sexual activity: Yes

## 2018-09-11 NOTE — Telephone Encounter (Signed)
Patient called triage phone this afternoon stating that his knee is hurting worse this afternoon than it did before he had it aspirated and injected by Dr Marlou Sa this morning. He is requesting something for pain.  However, I ran the narcotic search on him and he has been getting #90 Tylenol #3 about every 1 1/2 months from another provider. Please advise. Thanks.

## 2018-09-12 NOTE — Telephone Encounter (Signed)
When was his last prescription and for how many then we can decide if he gets any more thanks

## 2018-09-12 NOTE — Telephone Encounter (Signed)
I would see if he continues to get the Tylenol 3 from his other provider which she is due for in about 5 days.  In the meantime we can try him with tramadol 1 p.o. every 8 hours as needed pain #21 with no refills thanks

## 2018-09-12 NOTE — Telephone Encounter (Signed)
Patient states he cannot recall getting the Tylenol #3 and will check his medication box.

## 2018-09-12 NOTE — Telephone Encounter (Signed)
#  90 on 04/10

## 2018-09-21 ENCOUNTER — Telehealth: Payer: Self-pay | Admitting: Internal Medicine

## 2018-09-21 ENCOUNTER — Encounter: Payer: Self-pay | Admitting: Internal Medicine

## 2018-09-21 ENCOUNTER — Other Ambulatory Visit: Payer: Self-pay

## 2018-09-21 DIAGNOSIS — Z20822 Contact with and (suspected) exposure to covid-19: Secondary | ICD-10-CM

## 2018-09-21 NOTE — Telephone Encounter (Signed)
Spoke with the patient about possible Covid-19 exposure at 09/11/18 office visit to [facility].  RN explained the free Covid-19 screening offered for this exposure.  Notified that she and her driver should wear a masks and the drive-up screening will take place at the old [facility] on [address].  Patient agreed to come 09/22/18 at 1200 noon at the [facility] location.

## 2018-09-21 NOTE — Telephone Encounter (Signed)
error 

## 2018-09-22 ENCOUNTER — Other Ambulatory Visit: Payer: Self-pay | Admitting: Hematology

## 2018-09-22 ENCOUNTER — Other Ambulatory Visit: Payer: Self-pay

## 2018-09-22 ENCOUNTER — Telehealth: Payer: Self-pay

## 2018-09-22 DIAGNOSIS — Z20822 Contact with and (suspected) exposure to covid-19: Secondary | ICD-10-CM

## 2018-09-23 NOTE — Telephone Encounter (Signed)
Patient was on the 09/22/18 schedule for COVID testing.  He missed his appointment time and a call was made to him.  He stated that he was not having any symptoms, that his daughter worked in healthcare and that he and his wife kept their daughter's children.

## 2018-09-24 ENCOUNTER — Telehealth: Payer: Self-pay | Admitting: Radiology

## 2018-09-24 NOTE — Telephone Encounter (Signed)
Noted, thanks!

## 2018-09-24 NOTE — Telephone Encounter (Signed)
Mya from [place] with [employer] called to notify.  MR LUMBAR, MR CERVICAL, MR KNEE  All has been denied, reasoning will be faxed.  I have also made note of this in each order referral.

## 2018-09-27 ENCOUNTER — Telehealth: Payer: Self-pay

## 2018-09-27 ENCOUNTER — Other Ambulatory Visit: Payer: Self-pay

## 2018-09-27 ENCOUNTER — Ambulatory Visit
Admission: RE | Admit: 2018-09-27 | Discharge: 2018-09-27 | Disposition: A | Discharge status: Home / Self Care | Payer: PRIVATE HEALTH INSURANCE | Source: Ambulatory Visit | Attending: Orthopedic Surgery | Admitting: Orthopedic Surgery

## 2018-09-27 ENCOUNTER — Other Ambulatory Visit: Payer: Self-pay | Admitting: Orthopedic Surgery

## 2018-09-27 DIAGNOSIS — M545 Low back pain, unspecified: Secondary | ICD-10-CM

## 2018-09-27 DIAGNOSIS — M542 Cervicalgia: Secondary | ICD-10-CM

## 2018-09-27 NOTE — Telephone Encounter (Signed)
Patient called stating that he needs to speak with you and ask you some questions.  Would like a call back before you leave today.  Cb# is [phone].  Please advise.  Thank you.

## 2018-09-27 NOTE — Telephone Encounter (Signed)
Patient wanted to let you know that his knee has increased in pain since you aspirated the knee at his last OV.  He has an MRI scan pending for 06/11.  However, he is stating that he is out of pain medication and would like something for pain. He was previously a pain management patient but states he is no longer in pain management because his insurance does not cover it. Please advise. Thanks.

## 2018-09-27 NOTE — Telephone Encounter (Signed)
Can you please help me with this? Patient states he went for cervical and lumbar spine scans today and was told he could not have them because insurance denied them.  He states he was not aware of this until he arrived for the scans.  He also is scheduled for knee scan on 06/11 but does not want to make the drive if it is not going to be covered. He also wants to know what happened with spine scans. Can you please advise?

## 2018-09-30 NOTE — Telephone Encounter (Signed)
called and left vm to return my call on this matter

## 2018-09-30 NOTE — Telephone Encounter (Signed)
Ok for tramadol 1 po bid # 45

## 2018-10-01 MED ORDER — TRAMADOL HCL 50 MG PO TABS: ORAL_TABLET | ORAL | 0 refills | Status: DC

## 2018-10-01 NOTE — Addendum Note (Signed)
Addended byLaurann Montana on: 10/01/2018 11:52 AM   Modules accepted: Orders

## 2018-10-01 NOTE — Telephone Encounter (Signed)
Called to pharmacy 

## 2018-10-03 ENCOUNTER — Encounter: Payer: Self-pay | Admitting: *Deleted

## 2018-10-03 ENCOUNTER — Other Ambulatory Visit: Payer: PRIVATE HEALTH INSURANCE

## 2018-10-10 ENCOUNTER — Other Ambulatory Visit: Payer: Self-pay | Admitting: Internal Medicine

## 2018-10-10 MED ORDER — TRAZODONE HCL 50 MG PO TABS: ORAL_TABLET | ORAL | 1 refills | Status: DC

## 2018-10-18 ENCOUNTER — Other Ambulatory Visit: Payer: Self-pay | Admitting: Internal Medicine

## 2018-10-18 ENCOUNTER — Other Ambulatory Visit: Payer: Self-pay | Admitting: Orthopedic Surgery

## 2018-10-18 NOTE — Telephone Encounter (Signed)
y

## 2018-10-18 NOTE — Telephone Encounter (Signed)
Ok to rf? 

## 2018-10-21 ENCOUNTER — Other Ambulatory Visit: Payer: Self-pay | Admitting: Orthopedic Surgery

## 2018-10-21 ENCOUNTER — Telehealth: Payer: Self-pay | Admitting: Radiology

## 2018-10-21 MED ORDER — TRAMADOL HCL 50 MG PO TABS: ORAL_TABLET | ORAL | 0 refills | Status: DC

## 2018-10-21 NOTE — Telephone Encounter (Signed)
Patient called requesting to speak to Gabriel Cirri this afternoon. He states that he has been waiting for MRI scan, which was denied by his insurance. Per note in chart, Gabriel Cirri was going to discuss with patient.   Please call patient at [phone]

## 2018-10-23 ENCOUNTER — Telehealth: Payer: Self-pay | Admitting: Radiology

## 2018-10-23 NOTE — Telephone Encounter (Signed)
I contacted pt and advised him I am still waiting on the appeal to come back with decision, I will call to check on status but advised him that it could take up to 30 days for the appeal.

## 2018-10-23 NOTE — Telephone Encounter (Signed)
Patient has called again wanting to discuss insurance denial.  Call back # [phone]

## 2018-10-23 NOTE — Telephone Encounter (Signed)
sw pt already

## 2018-10-28 ENCOUNTER — Ambulatory Visit (INDEPENDENT_AMBULATORY_CARE_PROVIDER_SITE_OTHER): Payer: PRIVATE HEALTH INSURANCE

## 2018-10-28 ENCOUNTER — Other Ambulatory Visit: Payer: Self-pay

## 2018-10-28 ENCOUNTER — Encounter: Payer: Self-pay | Admitting: Podiatry

## 2018-10-28 ENCOUNTER — Ambulatory Visit: Payer: PRIVATE HEALTH INSURANCE | Admitting: Podiatry

## 2018-10-28 VITALS — Temp 98.4°F

## 2018-10-28 DIAGNOSIS — R600 Localized edema: Secondary | ICD-10-CM | POA: Diagnosis not present

## 2018-10-28 DIAGNOSIS — M205X2 Other deformities of toe(s) (acquired), left foot: Secondary | ICD-10-CM

## 2018-10-28 DIAGNOSIS — M779 Enthesopathy, unspecified: Secondary | ICD-10-CM | POA: Diagnosis not present

## 2018-10-28 DIAGNOSIS — M21622 Bunionette of left foot: Secondary | ICD-10-CM

## 2018-10-28 DIAGNOSIS — M659 Synovitis and tenosynovitis, unspecified: Secondary | ICD-10-CM | POA: Diagnosis not present

## 2018-10-30 NOTE — Progress Notes (Signed)
   HPI: 53 year old male presenting today for follow up evaluation of hallux limitus of the left foot. He states he believes the symptoms are worsening. He reports swelling and burning pain. Wearing shoes increases the pain. He has been icing and soaking the foot for treatment. Patient is here for further evaluation and treatment.   Past Medical History:  Diagnosis Date  . Anxiety   . Anxiety and depression   . Bruit    L  . Chest pain    hx  . Diabetes mellitus without complication ([place])    "borderline"  . Edema   . Fatty liver   . GERD (gastroesophageal reflux disease)    uses Omeprazole  . Goiter   . HLD (hyperlipidemia)   . Hypertension    essential, benign  . Hypothyroidism   . Impotence of organic origin   . Murmur   . Other chest pain    tightness, pressure  . Palpitation    hx  . Precordial pain   . Sleep apnea    uses cpap     Physical Exam: General: The patient is alert and oriented x3 in no acute distress.  Dermatology: Skin is warm, dry and supple bilateral lower extremities. Negative for open lesions or macerations.  Vascular: Left lower edema noted. Palpable pedal pulses bilaterally. No erythema noted. Capillary refill within normal limits.  Neurological: Epicritic and protective threshold grossly intact bilaterally.   Musculoskeletal Exam: Pain on palpation with limited range of motion noted to the first MPJ left foot. Clinical evidence of Tailor's bunion deformity noted to the respective foot. There is a moderate pain on palpation range of motion of the fifth MPJ. Pain with palpation noted to the anterior, lateral and medial aspects of the left ankle joint.   Radiographic Exam:  Increased intermetatarsal angle to the fourth interspace of the respective foot. Prominent fifth metatarsal head. Joint spaces preserved.      Assessment: 1. Hallux limitus left  2. Tailor's bunion left 3. LLE edema 4. H/o left knee meniscus tear with bursitis  5. Left  ankle synovitis    Plan of Care:  1. Patient evaluated. X-Rays reviewed.  2. Injection of 0.5 mLs Celestone Soluspan injected into the left ankle joint.  3. Unna boot applied to LLE.  4. Post op shoe dispensed.  5. Return to clinic in 2 weeks to discuss surgery for hallux limitus and Tailor's bunion.      Edrick Kins, DPM [facility]  Dr. Edrick Kins, DPM    [address], [place] [address]                Office [phone]  Fax [phone]

## 2018-10-30 NOTE — Telephone Encounter (Signed)
I sw Crystal with [employer] and she stated they had received the appeal letter and should have answer by Friday afternoon. Person to speak with if do not hear by end of day is Nojma Muhammed her number is [phone] ext [phone], I called pt and informed him of this.

## 2018-11-01 ENCOUNTER — Telehealth: Payer: Self-pay

## 2018-11-01 ENCOUNTER — Other Ambulatory Visit: Payer: Self-pay | Admitting: Orthopedic Surgery

## 2018-11-01 ENCOUNTER — Other Ambulatory Visit: Payer: Self-pay

## 2018-11-01 MED ORDER — OMEPRAZOLE 40 MG PO CPDR: 40.0000 mg | DELAYED_RELEASE_CAPSULE | Freq: Two times a day (BID) | ORAL | 0 refills | Status: DC

## 2018-11-01 MED ORDER — LEVOTHYROXINE SODIUM 88 MCG PO TABS: 88.0000 ug | ORAL_TABLET | Freq: Every day | ORAL | 0 refills | Status: DC

## 2018-11-01 MED ORDER — METHOCARBAMOL 500 MG PO TABS: 500.0000 mg | ORAL_TABLET | Freq: Three times a day (TID) | ORAL | 0 refills | Status: DC | PRN

## 2018-11-01 MED ORDER — PROMETHAZINE HCL 12.5 MG PO TABS: 12.5000 mg | ORAL_TABLET | Freq: Three times a day (TID) | ORAL | 0 refills | Status: DC | PRN

## 2018-11-01 NOTE — Telephone Encounter (Signed)
IC patient. I explained to him that you had started appeal process but had not heard anything about approval.  Can you please reach out to patient on Monday and let him know the current status?  Also patient was upset about his medication not getting authorized that was sent to pharmacy on 06/29.  I called pharmacy and according to records this was submitted for auth at [email or web address] on 07/08. I advised patient would follow up with this on Monday.

## 2018-11-01 NOTE — Telephone Encounter (Signed)
Dr Dean patient

## 2018-11-01 NOTE — Telephone Encounter (Signed)
Patient would like a CB today concerning his MRI.  Cb# is [phone].  Please advise.  Thank you.

## 2018-11-01 NOTE — Telephone Encounter (Signed)
Ok to rf? 

## 2018-11-01 NOTE — Telephone Encounter (Signed)
y

## 2018-11-04 MED ORDER — TRAMADOL HCL 50 MG PO TABS: ORAL_TABLET | ORAL | 0 refills | Status: DC

## 2018-11-04 NOTE — Telephone Encounter (Signed)
Can you please submit this electronically for me please?

## 2018-11-04 NOTE — Telephone Encounter (Signed)
I called [employer] to check on status of Appeal and left message using my celln# to return my call on status. Pending call back.

## 2018-11-06 NOTE — Telephone Encounter (Signed)
Received call from [place] with [employer] appeals stating this has been overturned and will send the infrmation by fax with the authorizaiton # and to go ahead and get scheduled. I called pt also, left vm stating this and to call if any quiestions

## 2018-11-06 NOTE — Telephone Encounter (Signed)
Received call from [place] with [employer] stating this has been overturned and will send the infrmation by fax with the authorizaiton # and to go ahead and get scheduled. I called pt also, left vm stating this and to call if any quiestions

## 2018-11-11 ENCOUNTER — Ambulatory Visit: Payer: PRIVATE HEALTH INSURANCE | Admitting: Podiatry

## 2018-11-11 ENCOUNTER — Encounter: Payer: Self-pay | Admitting: Podiatry

## 2018-11-11 ENCOUNTER — Other Ambulatory Visit: Payer: Self-pay

## 2018-11-11 VITALS — Temp 97.3°F

## 2018-11-11 DIAGNOSIS — M21622 Bunionette of left foot: Secondary | ICD-10-CM | POA: Diagnosis not present

## 2018-11-11 DIAGNOSIS — M659 Synovitis and tenosynovitis, unspecified: Secondary | ICD-10-CM

## 2018-11-11 DIAGNOSIS — M205X2 Other deformities of toe(s) (acquired), left foot: Secondary | ICD-10-CM

## 2018-11-11 DIAGNOSIS — R600 Localized edema: Secondary | ICD-10-CM | POA: Diagnosis not present

## 2018-11-11 NOTE — Patient Instructions (Signed)
Pre-Operative Instructions  Congratulations, you have decided to take an important step towards improving your quality of life.  You can be assured that the doctors and staff at [facility] will be with you every step of the way.  Here are some important things you should know:  1. Plan to be at the surgery center/hospital at least 1 (one) hour prior to your scheduled time, unless otherwise directed by the surgical center/hospital staff.  You must have a responsible adult accompany you, remain during the surgery and drive you home.  Make sure you have directions to the surgical center/hospital to ensure you arrive on time. 2. If you are having surgery at [facility] or [facility], you will need a copy of your medical history and physical form from your family physician within one month prior to the date of surgery. We will give you a form for your primary physician to complete.  3. We make every effort to accommodate the date you request for surgery.  However, there are times where surgery dates or times have to be moved.  We will contact you as soon as possible if a change in schedule is required.   4. No aspirin/ibuprofen for one week before surgery.  If you are on aspirin, any non-steroidal anti-inflammatory medications (Mobic, Aleve, Ibuprofen) should not be taken seven (7) days prior to your surgery.  You make take Tylenol for pain prior to surgery.  5. Medications - If you are taking daily heart and blood pressure medications, seizure, reflux, allergy, asthma, anxiety, pain or diabetes medications, make sure you notify the surgery center/hospital before the day of surgery so they can tell you which medications you should take or avoid the day of surgery. 6. No food or drink after midnight the night before surgery unless directed otherwise by surgical center/hospital staff. 7. No alcoholic beverages 24-hours prior to surgery.  No smoking 24-hours prior or 24-hours after  surgery. 8. Wear loose pants or shorts. They should be loose enough to fit over bandages, boots, and casts. 9. Don't wear slip-on shoes. Sneakers are preferred. 10. Bring your boot with you to the surgery center/hospital.  Also bring crutches or a walker if your physician has prescribed it for you.  If you do not have this equipment, it will be provided for you after surgery. 11. If you have not been contacted by the surgery center/hospital by the day before your surgery, call to confirm the date and time of your surgery. 12. Leave-time from work may vary depending on the type of surgery you have.  Appropriate arrangements should be made prior to surgery with your employer. 13. Prescriptions will be provided immediately following surgery by your doctor.  Fill these as soon as possible after surgery and take the medication as directed. Pain medications will not be refilled on weekends and must be approved by the doctor. 14. Remove nail polish on the operative foot and avoid getting pedicures prior to surgery. 15. Wash the night before surgery.  The night before surgery wash the foot and leg well with water and the antibacterial soap provided. Be sure to pay special attention to beneath the toenails and in between the toes.  Wash for at least three (3) minutes. Rinse thoroughly with water and dry well with a towel.  Perform this wash unless told not to do so by your physician.  Enclosed: 1 Ice pack (please put in freezer the night before surgery)   1 Hibiclens skin cleaner     Pre-op instructions  If you have any questions regarding the instructions, please do not hesitate to call our office.  [place]: [address], [place], [place] [address] -- [phone]  [place]: [address] [place], [place] [address] -- [phone]  [place]: [address]  [place], [place] [address] -- [phone]  [place]: [address], [address], [place], [place] [address] -- [phone]  Website:  [email or web address] 

## 2018-11-13 NOTE — Progress Notes (Signed)
   HPI: 53 year old male presenting today for follow up evaluation of left foot and ankle pain. He reports continued pain. He states the swelling has improved with use of the unna boot. Wearing shoes and being on his feet increases the pain. Patient is here for further evaluation and treatment.   Past Medical History:  Diagnosis Date  . Anxiety   . Anxiety and depression   . Bruit    L  . Chest pain    hx  . Diabetes mellitus without complication ([place])    "borderline"  . Edema   . Fatty liver   . GERD (gastroesophageal reflux disease)    uses Omeprazole  . Goiter   . HLD (hyperlipidemia)   . Hypertension    essential, benign  . Hypothyroidism   . Impotence of organic origin   . Murmur   . Other chest pain    tightness, pressure  . Palpitation    hx  . Precordial pain   . Sleep apnea    uses cpap     Physical Exam: General: The patient is alert and oriented x3 in no acute distress.  Dermatology: Skin is warm, dry and supple bilateral lower extremities. Negative for open lesions or macerations.  Vascular: Palpable pedal pulses bilaterally. No erythema or edema noted. Capillary refill within normal limits.  Neurological: Epicritic and protective threshold grossly intact bilaterally.   Musculoskeletal Exam: Pain on palpation with limited range of motion noted to the first MPJ left foot. Clinical evidence of Tailor's bunion deformity noted to the respective foot. There is a moderate pain on palpation range of motion of the fifth MPJ. Pain with palpation noted to the anterior, lateral and medial aspects of the left ankle joint.   Assessment: 1. Hallux limitus left  2. Tailor's bunion left 3. LLE edema - resolved  4. H/o left knee meniscus tear with bursitis  5. Left ankle synovitis    Plan of Care:  1. Patient evaluated. 2. Today we discussed the conservative versus surgical management of the presenting pathology. The patient opts for surgical management. All possible  complications and details of the procedure were explained. All patient questions were answered. No guarantees were expressed or implied. 3. Authorization for surgery was initiated today. Surgery will consist of left 1st MPJ arthroplasty with implant; Tailor's bunionectomy with osteotomy left.  4. Return to clinic one week post op.      Edrick Kins, DPM [facility]  Dr. Edrick Kins, DPM    [address], [place] [address]                Office [phone]  Fax [phone]

## 2018-11-21 ENCOUNTER — Telehealth: Payer: Self-pay | Admitting: *Deleted

## 2018-11-21 NOTE — Telephone Encounter (Signed)
"  Do you have a different insurance card for [name]?  He's scheduled for September 10."  I have [employer].  "Yes, that's what we have and we called the insurance company and it's saying it has been terminated."  I'll call the patient.  "Disregard my previous message.  We got it."  I was wondering because I e-verified it.  "What address do you have for it?"  It [place] [address] [place] [phone].

## 2018-11-26 ENCOUNTER — Encounter: Payer: Self-pay | Admitting: Podiatry

## 2018-11-26 ENCOUNTER — Telehealth: Payer: Self-pay | Admitting: *Deleted

## 2018-11-26 NOTE — Telephone Encounter (Signed)
Pt called again and states he did not have a time for his surgery. I told pt the surgery was 01/02/2019, and [facility] would call about 24-48 hours prior to the surgery with a time, if they had not called before noon the day before call them on the number listed on the back of their pamphlet. Pt states understanding.

## 2018-11-26 NOTE — Telephone Encounter (Signed)
Pt called left name, DOB and phone number.

## 2018-11-26 NOTE — Telephone Encounter (Signed)
Left message informing pt that if he would leave a message with a question or concern often I would be able to return the call with an answer.

## 2018-11-28 ENCOUNTER — Other Ambulatory Visit: Payer: Self-pay | Admitting: Internal Medicine

## 2018-11-28 ENCOUNTER — Encounter (HOSPITAL_COMMUNITY): Payer: Self-pay | Admitting: Psychiatry

## 2018-11-28 ENCOUNTER — Other Ambulatory Visit: Payer: Self-pay

## 2018-11-28 ENCOUNTER — Ambulatory Visit (INDEPENDENT_AMBULATORY_CARE_PROVIDER_SITE_OTHER): Payer: PRIVATE HEALTH INSURANCE | Admitting: Psychiatry

## 2018-11-28 DIAGNOSIS — F411 Generalized anxiety disorder: Secondary | ICD-10-CM | POA: Diagnosis not present

## 2018-11-28 DIAGNOSIS — F331 Major depressive disorder, recurrent, moderate: Secondary | ICD-10-CM

## 2018-11-28 MED ORDER — LAMOTRIGINE 200 MG PO TABS: ORAL_TABLET | ORAL | 2 refills | Status: DC

## 2018-11-28 NOTE — Progress Notes (Signed)
Virtual Visit via Telephone Note  I connected with [name] on 11/28/18 at  9:00 AM EDT by telephone and verified that I am speaking with the correct person using two identifiers.   I discussed the limitations, risks, security and privacy concerns of performing an evaluation and management service by telephone and the availability of in person appointments. I also discussed with the patient that there may be a patient responsible charge related to this service. The patient expressed understanding and agreed to proceed.   History of Present Illness: Patient was evaluated through phone session.  On his last visit we increased Lamictal 150 mg daily.  He noticed some improvement as he does not cry as much and duration of crying is less but he still struggle with anxiety, depression, hopelessness and sadness.  He is taking care of his wife and now recently he noticed he is having knee pain.  He was told he may have torn his meniscus.  He is getting MRI and have upcoming appointments to see specialist.  Patient has multiple health issues including chronic pain, back pain and diabetes.  He had filed for disability through the lawyer and now he is pleased that his court hearing is expedited.  He is hoping that his disability approved.  He admitted a lot of anxiety due to financial strain.  He cannot leave his home because he is taking care of his wife.  He admitted poor sleep, racing thoughts, sometimes hopelessness but denies any suicidal thoughts or homicidal thoughts.  He endorsed lack of energy, motivation to do things.  We have recommended to see a therapist but due to taking care of his wife he has unable to schedule appointment.  However now he is willing to try a virtual counseling.  He is tolerating higher dose of Lamictal.  He has no rash, itching, tremors or shakes.  He is also getting Klonopin 0.5 mg twice a day, BuSpar 10 mg 3 times a day, Cymbalta 60 mg daily and gabapentin 600 mg twice a day  from other provider.  He denies any agitation or any aggression.  He denies any severe mood swings.  He denies drinking or using any illegal substances.   Past Psychiatric History:Reviewed. H/Odepression and anxiety. No h/osuicidal attempt or inpatient treatment. Seen Dr. Jake [name] and given Zoloft, Klonopin and Cymbalta. Later PCP added BuSpar and trazodone.    Psychiatric Specialty Exam: Physical Exam  ROS  There were no vitals taken for this visit.There is no height or weight on file to calculate BMI.  General Appearance: NA  Eye Contact:  NA  Speech:  Clear and Coherent and Slow  Volume:  Normal  Mood:  Anxious and Dysphoric  Affect:  NA  Thought Process:  Goal Directed  Orientation:  Full (Time, Place, and Person)  Thought Content:  Rumination  Suicidal Thoughts:  No  Homicidal Thoughts:  No  Memory:  Immediate;   Good Recent;   Good Remote;   Good  Judgement:  Good  Insight:  Good  Psychomotor Activity:  NA  Concentration:  Concentration: Fair and Attention Span: Fair  Recall:  Good  Fund of Knowledge:  Good  Language:  Good  Akathisia:  No  Handed:  Right  AIMS (if indicated):     Assets:  Communication Skills Desire for Improvement Housing Resilience Transportation  ADL's:  Intact  Cognition:  WNL  Sleep:   fair      Assessment and Plan: Major depressive disorder, recurrent.  Generalized  anxiety disorder.  I reviewed his medication.  I recommend to try Lamictal 200 mg since he is tolerating well and reported no side effects.  I also recommend if increased Lamictal did not help then he should talk to Dr. Kalman Jewels who is managing his psychotropic medication and consider increasing either Cymbalta or BuSpar.  Patient is trying to get disability and wanted to have his lawyer contact us to get the records.  I agree that once we receive the consent we will provide the records to his lawyer.  Patient is agreed to see a virtual therapist to help his coping skills.   We will provide names and contact information of the therapist for counseling.  Discussed medication side effects and benefits.  Reminded that if Lamictal caused rash that he need to stop the medication immediately and call us back.  Discussed safety concerns at any time having active suicidal thoughts or homicidal thought that he need to call 911 of the local museum.  Follow-up in 3 months.  Follow Up Instructions:    I discussed the assessment and treatment plan with the patient. The patient was provided an opportunity to ask questions and all were answered. The patient agreed with the plan and demonstrated an understanding of the instructions.   The patient was advised to call back or seek an in-person evaluation if the symptoms worsen or if the condition fails to improve as anticipated.  I provided 20 minutes of non-face-to-face time during this encounter.   Kathlee Nations, MD

## 2018-11-28 NOTE — Telephone Encounter (Signed)
Refilled: 08/06/2018 Last OV: 07/30/2018 Next OV: not scheduled

## 2018-11-29 ENCOUNTER — Other Ambulatory Visit: Payer: Self-pay | Admitting: Internal Medicine

## 2018-11-29 DIAGNOSIS — F411 Generalized anxiety disorder: Secondary | ICD-10-CM

## 2018-12-05 ENCOUNTER — Ambulatory Visit
Admission: RE | Admit: 2018-12-05 | Discharge: 2018-12-05 | Disposition: A | Discharge status: Home / Self Care | Payer: PRIVATE HEALTH INSURANCE | Source: Ambulatory Visit | Attending: Orthopedic Surgery | Admitting: Orthopedic Surgery

## 2018-12-05 ENCOUNTER — Other Ambulatory Visit: Payer: Self-pay

## 2018-12-05 DIAGNOSIS — S838X2D Sprain of other specified parts of left knee, subsequent encounter: Secondary | ICD-10-CM

## 2018-12-09 ENCOUNTER — Ambulatory Visit (INDEPENDENT_AMBULATORY_CARE_PROVIDER_SITE_OTHER): Payer: PRIVATE HEALTH INSURANCE | Admitting: Orthopedic Surgery

## 2018-12-09 ENCOUNTER — Encounter: Payer: Self-pay | Admitting: Orthopedic Surgery

## 2018-12-09 DIAGNOSIS — M545 Low back pain, unspecified: Secondary | ICD-10-CM

## 2018-12-09 MED ORDER — TRAMADOL HCL 50 MG PO TABS: ORAL_TABLET | ORAL | 0 refills | Status: DC

## 2018-12-11 ENCOUNTER — Encounter: Payer: Self-pay | Admitting: Orthopedic Surgery

## 2018-12-11 NOTE — Progress Notes (Addendum)
Office Visit Note   Patient: [name]           Date of Birth: 05-01-1965           MRN: [id number] Visit Date: 12/09/2018 Requested by: Joshua Mc, MD [place],  [place] [address] PCP: Joshua Mc, MD  Subjective: Chief Complaint  Patient presents with  . Follow-up    HPI: Patient presents for follow-up after multiple MRI scans.  He had cervical MRI scan lumbar spine MRI scan and left knee MRI scan.  Patient does report some numbness and tingling in the left arm and left knee.  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  I think this is a surgical problem as he is having some weakness and is dropping things.  On the lumbar spine he has left-sided L3-4 lateral recess stenosis but no nerve root compression.  This looks like something that does not require surgery.  The left knee has medial meniscal tear with some arthritis in that medial compartment but not enough for total knee replacement.  I think that something that we can addressed arthroscopically.  In addition to the problems described above [name] also has a history of left shoulder rotator cuff surgery and repair along with right shoulder rotator cuff surgery and repair with subsequent biceps deformity on the right-hand side.  I think the right shoulder rotator cuff repair may have issues in terms of integrity of the repair.  Patient is also had right total knee revision due to loosening.  All of these issues combined with his neck issues and left arm weakness along with knee pain on the left-hand side contribute to significant musculoskeletal morbidity and disability in this patient.              ROS: All systems reviewed are negative as they relate to the chief complaint within the history of present illness.  Patient denies  fevers or chills.   Assessment & Plan: Visit Diagnoses:  1. 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  I think this is a neurosurgical problem.  Follow-up with [name] for neurosurgical evaluation.  Also can refill his tramadol because of this pain he is having.  I think we can address his knee arthroscopically.  Does not need knee replacement.  Has had that on the right-hand side.  I think we can delay his need for knee replacement on the left with arthroscopic meniscal debridement.  He has a Baker's cyst as well which I do not think is symptomatic and he also has a loose body in that anterior compartment.  That can be removed as well.  I think the back is something that can be treated with injections if needed.  I will see him back 7 days after surgery.  The risk and benefits of arthroscopic knee surgery are discussed including but not limited to infection nerve vessel damage knee stiffness incomplete pain relief and potential need for more surgery.  Follow-Up Instructions: No follow-ups on file.   Orders:  Orders Placed This Encounter  Procedures  . Ambulatory referral to Neurosurgery   Meds ordered this encounter  Medications  . traMADol (ULTRAM) 50 MG  tablet    Sig: 1 po bid prn pain    Dispense:  45 tablet    Refill:  0      Procedures: No procedures performed   Clinical Data: No additional findings.  Objective: Vital Signs: There were no vitals taken for this visit.  Physical Exam:   Constitutional: Patient appears well-developed HEENT:  Head: Normocephalic Eyes:EOM are normal Neck: Normal range of motion Cardiovascular: Normal rate Pulmonary/chest: Effort normal Neurologic: Patient is alert Skin: Skin is warm Psychiatric: Patient has normal mood and affect    Ortho Exam: Ortho  exam demonstrates full active and passive range of motion of that left knee with trace effusion.  He does have some medial greater than lateral joint line tenderness with intact extensor mechanism.  No nerve root tension signs today no definite paresthesias L1 S1 bilaterally.  No muscle atrophy in the legs.  Has reasonable cervical spine range of motion but definitely paresthesias on the left compared to the right.  Grip strength is also a little bit less on the left compared to right.  Specialty Comments:  No specialty comments available.  Imaging: No results found.   PMFS History: Patient Active Problem List   Diagnosis Date Noted  . Angioedema of lips 11/26/2016  . Cognitive complaints with normal neuropsychological exam 10/23/2016  . 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 Hypothyroidism due to acquired atrophy of thyroid 09/14/2015  . Arthritis of knee 06/23/2014  . Encounter for preventive health examination 06/17/2014  . Enlarged RV (right ventricle) 06/17/2014  . Cervical spine degeneration 02/17/2014  . Overweight 10/29/2013  . Chronic knee pain 10/29/2013  . Diabetes mellitus type 2 in obese ([place]) 10/23/2012  . Seborrheic dermatitis, unspecified 05/17/2012  . Fatty liver 04/22/2012  . Sciatica of right side 12/24/2010  . Hyperlipidemia associated with type 2 diabetes mellitus ([place]) 01/13/2009  . Anxiety state 01/13/2009  . Essential hypertension 01/13/2009  . OSA (obstructive sleep apnea) 01/13/2009   Past Medical History:  Diagnosis Date  . Anxiety   . Anxiety and depression   . Bruit    L  . Chest pain    hx  . Diabetes mellitus without complication ([place])    "borderline"  . Edema   . Fatty liver   . GERD (gastroesophageal reflux disease)    uses Omeprazole  . Goiter   . HLD (hyperlipidemia)   . Hypertension    essential, benign  .  Hypothyroidism   . Impotence of organic origin   . Murmur   . Other chest pain    tightness, pressure  . Palpitation    hx  . Precordial pain   . Sleep apnea    uses cpap    Family History  Problem Relation Age of Onset  . Heart disease Father     Past Surgical History:  Procedure Laterality Date  . APPENDECTOMY  2002   [facility]  . CARDIAC CATHETERIZATION    . CARDIAC CATHETERIZATION N/A 08/27/2015   Procedure: Left Heart Cath and Coronary Angiography;  Surgeon: Jolaine Artist, MD;  Location: [facility];  Service: Cardiovascular;  Laterality: N/A;  . COLONOSCOPY    . HERNIA REPAIR  0-86-76   umbilical  . JOINT REPLACEMENT     right knee  . REVISION TOTAL KNEE ARTHROPLASTY Right 06/23/2014   DR Marlou Sa  . TONSILLECTOMY    .  TOTAL KNEE ARTHROPLASTY Right 2011   right  . TOTAL KNEE REVISION Right 06/23/2014   Procedure: TOTAL KNEE REVISION;  Surgeon: Meredith Pel, MD;  Location: [facility];  Service: Orthopedics;  Laterality: Right;   Social History   Occupational History  . Occupation: IT trainer  . Occupation: SERVICE DIRECTOR    Employer: [employer]  Tobacco Use  . Smoking status: Former Smoker    Packs/day: 2.00    Years: 18.00    Pack years: 36.00    Types: Cigarettes    Quit date: 04/24/2005    Years since quitting: 13.6  . Smokeless tobacco: Never Used  . Tobacco comment: quit 20 years ago   Substance and Sexual Activity  . Alcohol use: Yes    Comment: occasional  . Drug use: No  . Sexual activity: Yes

## 2018-12-12 ENCOUNTER — Encounter: Payer: Self-pay | Admitting: Orthopedic Surgery

## 2018-12-13 ENCOUNTER — Other Ambulatory Visit (HOSPITAL_COMMUNITY)
Admission: RE | Admit: 2018-12-13 | Discharge: 2018-12-13 | Disposition: A | Discharge status: Home / Self Care | Payer: PRIVATE HEALTH INSURANCE | Source: Ambulatory Visit | Attending: Orthopedic Surgery | Admitting: Orthopedic Surgery

## 2018-12-13 DIAGNOSIS — Z20828 Contact with and (suspected) exposure to other viral communicable diseases: Secondary | ICD-10-CM | POA: Insufficient documentation

## 2018-12-13 DIAGNOSIS — Z01812 Encounter for preprocedural laboratory examination: Secondary | ICD-10-CM | POA: Diagnosis not present

## 2018-12-13 LAB — SARS CORONAVIRUS 2 (TAT 6-24 HRS): SARS Coronavirus 2: NEGATIVE

## 2018-12-13 NOTE — Telephone Encounter (Signed)
Note addended

## 2018-12-16 ENCOUNTER — Encounter (HOSPITAL_COMMUNITY): Payer: Self-pay | Admitting: *Deleted

## 2018-12-16 ENCOUNTER — Other Ambulatory Visit: Payer: Self-pay

## 2018-12-16 NOTE — Anesthesia Preprocedure Evaluation (Addendum)
Anesthesia Evaluation  Patient identified by MRN, date of birth, ID band Patient awake    Reviewed: Allergy & Precautions, NPO status , Patient's Chart, lab work & pertinent test results, reviewed documented beta blocker date and time   History of Anesthesia Complications Negative for: history of anesthetic complications  Airway Mallampati: I  TM Distance: >3 FB Neck ROM: Full    Dental no notable dental hx.    Pulmonary sleep apnea and Continuous Positive Airway Pressure Ventilation , former smoker,    Pulmonary exam normal breath sounds clear to auscultation       Cardiovascular hypertension, Pt. on home beta blockers and Pt. on medications Normal cardiovascular exam Rhythm:Regular Rate:Normal  ECG: rate 76. Normal sinus rhythm Right bundle branch block   Neuro/Psych PSYCHIATRIC DISORDERS Anxiety Depression negative neurological ROS     GI/Hepatic Neg liver ROS, GERD  Medicated and Controlled,  Endo/Other  diabetes, Type 2Hypothyroidism   Renal/GU negative Renal ROS     Musculoskeletal  (+) Arthritis ,   Abdominal (+) + obese,   Peds  Hematology HLD   Anesthesia Other Findings left knee medial meniscal tear and loose body  Reproductive/Obstetrics                           Anesthesia Physical Anesthesia Plan  ASA: III  Anesthesia Plan: General and Regional   Post-op Pain Management: GA combined w/ Regional for post-op pain   Induction: Intravenous  PONV Risk Score and Plan: 3 and Treatment may vary due to age or medical condition, Ondansetron, Dexamethasone and Midazolam  Airway Management Planned: LMA  Additional Equipment:   Intra-op Plan:   Post-operative Plan: Extubation in OR  Informed Consent: I have reviewed the patients History and Physical, chart, labs and discussed the procedure including the risks, benefits and alternatives for the proposed anesthesia with the  patient or authorized representative who has indicated his/her understanding and acceptance.     Dental advisory given  Plan Discussed with: CRNA  Anesthesia Plan Comments: (Reviewed PAT note written 12/16/2018 by Myra Gianotti, PA-C. SAME DAY WORK-UP   )     Anesthesia Quick Evaluation

## 2018-12-16 NOTE — Progress Notes (Signed)
Anesthesia Chart Review:  Case: [id number] Date/Time: 12/17/18 1456   Procedure: left knee arthroscopy, meniscal debridement, loose body removal (Left )   Anesthesia type: General   Pre-op diagnosis: left knee medial meniscal tear and loose body   Location: [facility] OR ROOM 06 / [facility]   Surgeon: Meredith Pel, MD      DISCUSSION: Patient is a 53 year old male scheduled for the above procedure.  History includes former smoker (quit 2007), HTN, HLD, DM ("borderline"), goiter, hypothyroidism, palpitations, murmur (mild MR 2012 echo), edema (2012), (history of) chest pain (normal cath 2010; 30% CX 2017), GERD, fatty liver, OSA (CPAP).   See below for recent cervical MRI that showed "congenital fusion at C2-3" with C5-6 spondylosis with disc bulge and moderate spinal canal stenosis with mild flattening of the ventral spinal cord. Dr. Marlou Sa has referred patient to neurosurgery Dr. Saintclair Halsted.   No significant CAD by 2017 LHC. He is a same day work-up and will need updated EKG and labs on arrival. Definitive plan following anesthesiologist evaluation. 12/13/18 COVID-19 test negative.   PROVIDERS: [name], MD is PCP  - Berniece Andreas, MD is psychiatrist - Genevie Cheshire, MD is pulmonologist. Last visit 05/16/17 for OSA. Kary Kos, MD is neurosurgeon - He is not followed routinely by cardiology, but has seen Bensimhon, Quillian Quince, MD for chest pain (2010), chronically abnormal EKG and preoperative evaluation (2016), and chest pain (2017, LHC 30% CX).   LABS: For day of surgery   IMAGES: MRI C-spine 12/05/18: IMPRESSION: - Motion degraded exam. - Redemonstrated C2-C3 congenital segmentation anomaly. - C5-C6 spondylosis has progressed since prior MRI 11/26/2013. At this level, a disc bulge with superimposed small right center disc protrusion contribute to moderate spinal canal stenosis with mild flattening of the ventral spinal cord. Moderate/severe left neural foraminal narrowing at this  level, also progressed. - No more than mild spinal canal stenosis at the remaining levels. Additional sites of neural foraminal narrowing as described, and greatest on the right at C3-C4 (moderate) and on the left at C4-C5 (moderate/severe).  MRI L-spine 12/05/18: IMPRESSION: - No new abnormality since the prior MRI. - Shallow left subarticular recess and foraminal protrusion at L3-4 causes mild narrowing in the left subarticular recess. No nerve root compression is identified.    EKG: For day of surgery if no EKG within the past year.   CV: Cardiac cath 08/27/15: Assessment & Plan: 1. Minimal CAD with 30% mid LCX lesion. LM, LAD, RCA angiographically normal. Plan Medical therapy.   Echo 10/07/10 (as outlined in my 05/2014 PAT note): Study Conclusions: - Left ventricle: The cavity size was normal. Wall thickness was increased in a pattern of mild LVH. Systolic function was normal. The estimated ejection fraction was in the range of 55% to 60%. Wall motion was normal; there were no regional wall motion abnormalities. - Mitral valve: Mild regurgitation. - Left atrium: The atrium was mildly dilated. - Right ventricle: The cavity size was mildly dilated. Systolic function was mildly reduced. - (Dr. Haroldine Laws due to mildly dilated RV, ordered CTA of the chest which was normal.)   Past Medical History:  Diagnosis Date  . Anxiety   . Anxiety and depression   . Bruit    L  . Chest pain    hx  . Diabetes mellitus without complication ([place])    "borderline"  . Edema   . Fatty liver   . GERD (gastroesophageal reflux disease)    uses Omeprazole  . Goiter   .  HLD (hyperlipidemia)   . Hypertension    essential, benign  . Hypothyroidism   . Impotence of organic origin   . Murmur   . Other chest pain    tightness, pressure  . Palpitation    hx  . Precordial pain   . Sleep apnea    uses cpap    Past Surgical History:  Procedure Laterality Date  . APPENDECTOMY  2002   done  at [facility]  . CARDIAC CATHETERIZATION    . CARDIAC CATHETERIZATION N/A 08/27/2015   Procedure: Left Heart Cath and Coronary Angiography;  Surgeon: Jolaine Artist, MD;  Location: [place] [facility];  Service: Cardiovascular;  Laterality: N/A;  . COLONOSCOPY    . HERNIA REPAIR  [id number]   umbilical  . JOINT REPLACEMENT     right knee  . REVISION TOTAL KNEE ARTHROPLASTY Right 06/23/2014   DR Marlou Sa  . TONSILLECTOMY    . TOTAL KNEE ARTHROPLASTY Right 2011   right  . TOTAL KNEE REVISION Right 06/23/2014   Procedure: TOTAL KNEE REVISION;  Surgeon: Meredith Pel, MD;  Location: [place];  Service: Orthopedics;  Laterality: Right;    MEDICATIONS: No current facility-administered medications for this encounter.    Marland Kitchen aspirin EC 81 MG tablet  . busPIRone (BUSPAR) 10 MG tablet  . Clobetasol Propionate 0.05 % shampoo  . clonazePAM (KLONOPIN) 0.5 MG tablet  . dicyclomine (BENTYL) 20 MG tablet  . DULoxetine (CYMBALTA) 60 MG capsule  . gabapentin (NEURONTIN) 600 MG tablet  . ibuprofen (ADVIL,MOTRIN) 200 MG tablet  . lamoTRIgine (LAMICTAL) 200 MG tablet  . levothyroxine (SYNTHROID) 88 MCG tablet  . losartan-hydrochlorothiazide (HYZAAR) 100-25 MG tablet  . methocarbamol (ROBAXIN) 500 MG tablet  . metoprolol tartrate (LOPRESSOR) 25 MG tablet  . Multiple Vitamin (MULTIVITAMIN WITH MINERALS) TABS tablet  . omeprazole (PRILOSEC) 40 MG capsule  . promethazine (PHENERGAN) 12.5 MG tablet  . rosuvastatin (CRESTOR) 20 MG tablet  . traMADol (ULTRAM) 50 MG tablet  . traZODone (DESYREL) 50 MG tablet  . BAYER MICROLET LANCETS lancets    Myra Gianotti, PA-C Surgical Short Stay/Anesthesiology [facility] Phone [phone] [facility] Phone [phone] 12/16/2018 1:42 PM

## 2018-12-16 NOTE — Progress Notes (Signed)
Patient denies shortness of breath, fever, cough and chest pain.  PCP -Dr Deborra Medina Cardiologist - Denies (Pt has seen Dr. Sela Hua in the past).  Chest x-ray - Denies  EKG - DOS 12/17/18 Stress Test - Denies ECHO - 10/07/10 Cardiac Cath - 08/27/15  Sleep Study - Yes, yrs ago, unknown location  CPAP - Uses CPAP but has lost 30 lbs  Borderline Diabetic per patient.  He does not check his blood sugar.  Diet controlled, no diabetes medication.  Patient has lost 30 lbs.  Aspirin Instructions: Last dose on 12/14/18 per patient.  ERAS: Clears til 32 Noon, no drink.  Anesthesia review: Yes, Ebony Hail, PA  STOP now taking any Aspirin (unless otherwise instructed by your surgeon), Aleve, Naproxen, Ibuprofen, Motrin, Advil, Goody's, BC's, all herbal medications, fish oil, and all vitamins.   Coronavirus Screening Have you or your wife experienced the following symptoms:  Cough yes/no: No Fever (>100.13F)  yes/no: No Runny nose yes/no: No Sore throat yes/no: No Difficulty breathing/shortness of breath  yes/no: No  Have you or your wife traveled in the last 14 days and where? yes/no: No

## 2018-12-17 ENCOUNTER — Encounter (HOSPITAL_COMMUNITY): Payer: Self-pay | Admitting: Surgery

## 2018-12-17 ENCOUNTER — Ambulatory Visit (HOSPITAL_COMMUNITY): Payer: PRIVATE HEALTH INSURANCE | Admitting: Vascular Surgery

## 2018-12-17 ENCOUNTER — Ambulatory Visit (HOSPITAL_COMMUNITY)
Admission: RE | Admit: 2018-12-17 | Discharge: 2018-12-17 | Disposition: A | Discharge status: Home / Self Care | Payer: PRIVATE HEALTH INSURANCE | Attending: Orthopedic Surgery | Admitting: Orthopedic Surgery

## 2018-12-17 ENCOUNTER — Encounter: Payer: Self-pay | Admitting: Orthopedic Surgery

## 2018-12-17 ENCOUNTER — Encounter (HOSPITAL_COMMUNITY)
Admission: RE | Disposition: A | Discharge status: Home / Self Care | Payer: Self-pay | Source: Home / Self Care | Attending: Orthopedic Surgery

## 2018-12-17 DIAGNOSIS — Z87891 Personal history of nicotine dependence: Secondary | ICD-10-CM | POA: Diagnosis not present

## 2018-12-17 DIAGNOSIS — K219 Gastro-esophageal reflux disease without esophagitis: Secondary | ICD-10-CM | POA: Diagnosis not present

## 2018-12-17 DIAGNOSIS — S83242A Other tear of medial meniscus, current injury, left knee, initial encounter: Secondary | ICD-10-CM

## 2018-12-17 DIAGNOSIS — E119 Type 2 diabetes mellitus without complications: Secondary | ICD-10-CM | POA: Insufficient documentation

## 2018-12-17 DIAGNOSIS — Z79890 Hormone replacement therapy (postmenopausal): Secondary | ICD-10-CM | POA: Diagnosis not present

## 2018-12-17 DIAGNOSIS — I10 Essential (primary) hypertension: Secondary | ICD-10-CM | POA: Diagnosis not present

## 2018-12-17 DIAGNOSIS — E785 Hyperlipidemia, unspecified: Secondary | ICD-10-CM | POA: Diagnosis not present

## 2018-12-17 DIAGNOSIS — Z7982 Long term (current) use of aspirin: Secondary | ICD-10-CM | POA: Insufficient documentation

## 2018-12-17 DIAGNOSIS — F329 Major depressive disorder, single episode, unspecified: Secondary | ICD-10-CM | POA: Diagnosis not present

## 2018-12-17 DIAGNOSIS — M23204 Derangement of unspecified medial meniscus due to old tear or injury, left knee: Secondary | ICD-10-CM | POA: Insufficient documentation

## 2018-12-17 DIAGNOSIS — M2342 Loose body in knee, left knee: Secondary | ICD-10-CM | POA: Insufficient documentation

## 2018-12-17 DIAGNOSIS — G4733 Obstructive sleep apnea (adult) (pediatric): Secondary | ICD-10-CM | POA: Insufficient documentation

## 2018-12-17 DIAGNOSIS — Z79899 Other long term (current) drug therapy: Secondary | ICD-10-CM | POA: Insufficient documentation

## 2018-12-17 DIAGNOSIS — F419 Anxiety disorder, unspecified: Secondary | ICD-10-CM | POA: Diagnosis not present

## 2018-12-17 DIAGNOSIS — G4730 Sleep apnea, unspecified: Secondary | ICD-10-CM | POA: Diagnosis not present

## 2018-12-17 DIAGNOSIS — E039 Hypothyroidism, unspecified: Secondary | ICD-10-CM | POA: Diagnosis not present

## 2018-12-17 DIAGNOSIS — Z96651 Presence of right artificial knee joint: Secondary | ICD-10-CM | POA: Diagnosis not present

## 2018-12-17 HISTORY — DX: Unspecified osteoarthritis, unspecified site: M19.90

## 2018-12-17 HISTORY — PX: KNEE ARTHROSCOPY: SHX127

## 2018-12-17 HISTORY — DX: Myoneural disorder, unspecified: G70.9

## 2018-12-17 LAB — CBC
HCT: 40.7 % (ref 39.0–52.0)
Hemoglobin: 13.9 g/dL (ref 13.0–17.0)
MCH: 32.2 pg (ref 26.0–34.0)
MCHC: 34.2 g/dL (ref 30.0–36.0)
MCV: 94.2 fL (ref 80.0–100.0)
Platelets: 236 10*3/uL (ref 150–400)
RBC: 4.32 MIL/uL (ref 4.22–5.81)
RDW: 12.1 % (ref 11.5–15.5)
WBC: 5.2 10*3/uL (ref 4.0–10.5)
nRBC: 0 % (ref 0.0–0.2)

## 2018-12-17 LAB — BASIC METABOLIC PANEL
Anion gap: 10 (ref 5–15)
BUN: 12 mg/dL (ref 6–20)
CO2: 26 mmol/L (ref 22–32)
Calcium: 9.1 mg/dL (ref 8.9–10.3)
Chloride: 101 mmol/L (ref 98–111)
Creatinine, Ser: 1 mg/dL (ref 0.61–1.24)
GFR calc Af Amer: >60 mL/min (ref >60)
GFR calc non Af Amer: >60 mL/min (ref >60)
Glucose, Bld: 111 mg/dL — ABNORMAL HIGH (ref 70–99)
Potassium: 3.7 mmol/L (ref 3.5–5.1)
Sodium: 137 mmol/L (ref 135–145)

## 2018-12-17 LAB — GLUCOSE, CAPILLARY: Glucose-Capillary: 124 mg/dL — ABNORMAL HIGH (ref 70–99)

## 2018-12-17 SURGERY — ARTHROSCOPY, KNEE
Anesthesia: General | Laterality: Left

## 2018-12-17 MED ORDER — BUPIVACAINE-EPINEPHRINE 0.25% -1:200000 IJ SOLN
INTRAMUSCULAR | Status: DC | PRN
  Administered 2018-12-17: 30 mL

## 2018-12-17 MED ORDER — OXYCODONE HCL 5 MG/5ML PO SOLN: 5.0000 mg | Freq: Once | ORAL | Status: DC | PRN

## 2018-12-17 MED ORDER — EPINEPHRINE PF 1 MG/ML IJ SOLN
INTRAMUSCULAR | Status: DC | PRN
  Administered 2018-12-17 (×2): 1 mg

## 2018-12-17 MED ORDER — MIDAZOLAM HCL 2 MG/2ML IJ SOLN
2.0000 mg | Freq: Once | INTRAMUSCULAR | Status: AC
  Administered 2018-12-17: 16:00:00 2 mg via INTRAVENOUS

## 2018-12-17 MED ORDER — OXYCODONE HCL 5 MG PO TABS: 5.0000 mg | ORAL_TABLET | Freq: Once | ORAL | Status: DC | PRN

## 2018-12-17 MED ORDER — ROPIVACAINE HCL 5 MG/ML IJ SOLN
INTRAMUSCULAR | Status: DC | PRN
  Administered 2018-12-17: 30 mL via PERINEURAL

## 2018-12-17 MED ORDER — FENTANYL CITRATE (PF) 100 MCG/2ML IJ SOLN: 25.0000 ug | INTRAMUSCULAR | Status: DC | PRN

## 2018-12-17 MED ORDER — LIDOCAINE 2% (20 MG/ML) 5 ML SYRINGE
INTRAMUSCULAR | Status: DC | PRN
  Administered 2018-12-17: 40 mg via INTRAVENOUS

## 2018-12-17 MED ORDER — ACETAMINOPHEN 500 MG PO TABS
1000.0000 mg | ORAL_TABLET | Freq: Once | ORAL | Status: AC
  Administered 2018-12-17: 1000 mg via ORAL
  Filled 2018-12-17: qty 2

## 2018-12-17 MED ORDER — PROPOFOL 10 MG/ML IV BOLUS
INTRAVENOUS | Status: DC | PRN
  Administered 2018-12-17: 200 mg via INTRAVENOUS

## 2018-12-17 MED ORDER — FENTANYL CITRATE (PF) 100 MCG/2ML IJ SOLN
INTRAMUSCULAR | Status: AC
  Administered 2018-12-17: 100 ug via INTRAVENOUS
  Filled 2018-12-17: qty 2

## 2018-12-17 MED ORDER — ACETAMINOPHEN 10 MG/ML IV SOLN: 1000.0000 mg | Freq: Once | INTRAVENOUS | Status: DC | PRN

## 2018-12-17 MED ORDER — FENTANYL CITRATE (PF) 100 MCG/2ML IJ SOLN
100.0000 ug | Freq: Once | INTRAMUSCULAR | Status: AC
  Administered 2018-12-17: 16:00:00 100 ug via INTRAVENOUS

## 2018-12-17 MED ORDER — KETOROLAC TROMETHAMINE 30 MG/ML IJ SOLN: 30.0000 mg | Freq: Once | INTRAMUSCULAR | Status: DC

## 2018-12-17 MED ORDER — EPHEDRINE 5 MG/ML INJ
INTRAVENOUS | Status: AC
  Filled 2018-12-17: qty 10

## 2018-12-17 MED ORDER — MORPHINE SULFATE (PF) 4 MG/ML IV SOLN
INTRAVENOUS | Status: AC
  Filled 2018-12-17: qty 2

## 2018-12-17 MED ORDER — SODIUM CHLORIDE 0.9 % IR SOLN
Status: DC | PRN
  Administered 2018-12-17 (×2): 3000 mL

## 2018-12-17 MED ORDER — 0.9 % SODIUM CHLORIDE (POUR BTL) OPTIME
TOPICAL | Status: DC | PRN
  Administered 2018-12-17: 1000 mL

## 2018-12-17 MED ORDER — OXYCODONE HCL 5 MG PO TABS: 5.0000 mg | ORAL_TABLET | Freq: Four times a day (QID) | ORAL | 0 refills | Status: DC | PRN

## 2018-12-17 MED ORDER — DEXMEDETOMIDINE HCL IN NACL 200 MCG/50ML IV SOLN
INTRAVENOUS | Status: DC | PRN
  Administered 2018-12-17 (×3): 4 ug via INTRAVENOUS

## 2018-12-17 MED ORDER — BUPIVACAINE HCL (PF) 0.25 % IJ SOLN
INTRAMUSCULAR | Status: DC | PRN
  Administered 2018-12-17: 30 mL

## 2018-12-17 MED ORDER — FENTANYL CITRATE (PF) 250 MCG/5ML IJ SOLN
INTRAMUSCULAR | Status: AC
  Filled 2018-12-17: qty 5

## 2018-12-17 MED ORDER — POVIDONE-IODINE 10 % EX SWAB: 2.0000 "application " | Freq: Once | CUTANEOUS | Status: DC

## 2018-12-17 MED ORDER — FENTANYL CITRATE (PF) 100 MCG/2ML IJ SOLN
INTRAMUSCULAR | Status: DC | PRN
  Administered 2018-12-17 (×2): 50 ug via INTRAVENOUS

## 2018-12-17 MED ORDER — CHLORHEXIDINE GLUCONATE 4 % EX LIQD: 60.0000 mL | Freq: Once | CUTANEOUS | Status: DC

## 2018-12-17 MED ORDER — PROMETHAZINE HCL 25 MG/ML IJ SOLN: 6.2500 mg | INTRAMUSCULAR | Status: DC | PRN

## 2018-12-17 MED ORDER — EPINEPHRINE PF 1 MG/ML IJ SOLN
INTRAMUSCULAR | Status: AC
  Filled 2018-12-17: qty 2

## 2018-12-17 MED ORDER — EPHEDRINE SULFATE-NACL 50-0.9 MG/10ML-% IV SOSY
PREFILLED_SYRINGE | INTRAVENOUS | Status: DC | PRN
  Administered 2018-12-17 (×3): 10 mg via INTRAVENOUS

## 2018-12-17 MED ORDER — LACTATED RINGERS IV SOLN
INTRAVENOUS | Status: DC | PRN
  Administered 2018-12-17 (×2): via INTRAVENOUS

## 2018-12-17 MED ORDER — ONDANSETRON HCL 4 MG/2ML IJ SOLN
INTRAMUSCULAR | Status: DC | PRN
  Administered 2018-12-17: 4 mg via INTRAVENOUS

## 2018-12-17 MED ORDER — METHOCARBAMOL 500 MG PO TABS: 500.0000 mg | ORAL_TABLET | Freq: Three times a day (TID) | ORAL | 0 refills | Status: DC | PRN

## 2018-12-17 MED ORDER — DEXAMETHASONE SODIUM PHOSPHATE 10 MG/ML IJ SOLN
INTRAMUSCULAR | Status: DC | PRN
  Administered 2018-12-17: 10 mg via INTRAVENOUS

## 2018-12-17 MED ORDER — PHENYLEPHRINE 40 MCG/ML (10ML) SYRINGE FOR IV PUSH (FOR BLOOD PRESSURE SUPPORT)
PREFILLED_SYRINGE | INTRAVENOUS | Status: AC
  Filled 2018-12-17: qty 10

## 2018-12-17 MED ORDER — PHENYLEPHRINE 40 MCG/ML (10ML) SYRINGE FOR IV PUSH (FOR BLOOD PRESSURE SUPPORT)
PREFILLED_SYRINGE | INTRAVENOUS | Status: DC | PRN
  Administered 2018-12-17: 80 ug via INTRAVENOUS
  Administered 2018-12-17: 120 ug via INTRAVENOUS

## 2018-12-17 MED ORDER — MIDAZOLAM HCL 2 MG/2ML IJ SOLN
INTRAMUSCULAR | Status: AC
  Administered 2018-12-17: 2 mg via INTRAVENOUS
  Filled 2018-12-17: qty 2

## 2018-12-17 MED ORDER — CLONIDINE HCL (ANALGESIA) 100 MCG/ML EP SOLN
EPIDURAL | Status: DC | PRN
  Administered 2018-12-17: 100 ug

## 2018-12-17 MED ORDER — CEFAZOLIN SODIUM-DEXTROSE 2-4 GM/100ML-% IV SOLN
2.0000 g | INTRAVENOUS | Status: AC
  Administered 2018-12-17: 2 g via INTRAVENOUS
  Filled 2018-12-17: qty 100

## 2018-12-17 MED ORDER — BUPIVACAINE HCL (PF) 0.25 % IJ SOLN
INTRAMUSCULAR | Status: AC
  Filled 2018-12-17: qty 30

## 2018-12-17 MED ORDER — BUPIVACAINE-EPINEPHRINE (PF) 0.25% -1:200000 IJ SOLN
INTRAMUSCULAR | Status: AC
  Filled 2018-12-17: qty 30

## 2018-12-17 MED ORDER — MORPHINE SULFATE 4 MG/ML IJ SOLN
INTRAMUSCULAR | Status: DC | PRN
  Administered 2018-12-17 (×2): 4 mg

## 2018-12-17 SURGICAL SUPPLY — 46 items
BANDAGE ESMARK 6X9 LF (GAUZE/BANDAGES/DRESSINGS) IMPLANT
BLADE CLIPPER SURG (BLADE) IMPLANT
BLADE EXCALIBUR 4.0X13 (MISCELLANEOUS) ×4 IMPLANT
BNDG ELASTIC 6X10 VLCR STRL LF (GAUZE/BANDAGES/DRESSINGS) ×2 IMPLANT
BNDG ESMARK 6X9 LF (GAUZE/BANDAGES/DRESSINGS)
COVER SURGICAL LIGHT HANDLE (MISCELLANEOUS) ×2 IMPLANT
COVER WAND RF STERILE (DRAPES) ×2 IMPLANT
CUFF TOURN SGL QUICK 34 (TOURNIQUET CUFF)
CUFF TOURN SGL QUICK 42 (TOURNIQUET CUFF) IMPLANT
CUFF TRNQT CYL 34X4.125X (TOURNIQUET CUFF) IMPLANT
DISSECTOR 4.0MM X 13CM (MISCELLANEOUS) ×2 IMPLANT
DRAPE ARTHROSCOPY W/POUCH 114 (DRAPES) ×2 IMPLANT
DRAPE U-SHAPE 47X51 STRL (DRAPES) ×2 IMPLANT
DRSG TEGADERM 4X4.75 (GAUZE/BANDAGES/DRESSINGS) ×6 IMPLANT
DURAPREP 26ML APPLICATOR (WOUND CARE) ×2 IMPLANT
DW OUTFLOW CASSETTE/TUBE SET (MISCELLANEOUS) ×2 IMPLANT
GAUZE SPONGE 4X4 12PLY STRL (GAUZE/BANDAGES/DRESSINGS) ×2 IMPLANT
GAUZE XEROFORM 1X8 LF (GAUZE/BANDAGES/DRESSINGS) ×2 IMPLANT
GLOVE BIOGEL PI IND STRL 8 (GLOVE) ×1 IMPLANT
GLOVE BIOGEL PI INDICATOR 8 (GLOVE) ×1
GLOVE SURG ORTHO 8.0 STRL STRW (GLOVE) ×2 IMPLANT
GOWN STRL REUS W/ TWL LRG LVL3 (GOWN DISPOSABLE) ×2 IMPLANT
GOWN STRL REUS W/ TWL XL LVL3 (GOWN DISPOSABLE) ×1 IMPLANT
GOWN STRL REUS W/TWL LRG LVL3 (GOWN DISPOSABLE) ×2
GOWN STRL REUS W/TWL XL LVL3 (GOWN DISPOSABLE) ×1
KIT BASIN OR (CUSTOM PROCEDURE TRAY) ×2 IMPLANT
KIT TURNOVER KIT B (KITS) ×2 IMPLANT
MANIFOLD NEPTUNE II (INSTRUMENTS) IMPLANT
NEEDLE 18GX1X1/2 (RX/OR ONLY) (NEEDLE) IMPLANT
NEEDLE HYPO 25GX1X1/2 BEV (NEEDLE) ×2 IMPLANT
NS IRRIG 1000ML POUR BTL (IV SOLUTION) IMPLANT
PACK ARTHROSCOPY DSU (CUSTOM PROCEDURE TRAY) ×2 IMPLANT
PAD ABD 8X10 STRL (GAUZE/BANDAGES/DRESSINGS) ×2 IMPLANT
PAD ARMBOARD 7.5X6 YLW CONV (MISCELLANEOUS) ×4 IMPLANT
PADDING CAST COTTON 6X4 STRL (CAST SUPPLIES) ×4 IMPLANT
SPONGE LAP 4X18 RFD (DISPOSABLE) ×2 IMPLANT
SUT ETHILON 3 0 PS 1 (SUTURE) ×2 IMPLANT
SYR 20ML ECCENTRIC (SYRINGE) ×2 IMPLANT
SYR CONTROL 10ML LL (SYRINGE) IMPLANT
SYR TB 1ML LUER SLIP (SYRINGE) ×2 IMPLANT
TOWEL GREEN STERILE (TOWEL DISPOSABLE) ×2 IMPLANT
TOWEL GREEN STERILE FF (TOWEL DISPOSABLE) ×2 IMPLANT
TUBE CONNECTING 12X1/4 (SUCTIONS) ×2 IMPLANT
TUBING ARTHROSCOPY IRRIG 16FT (MISCELLANEOUS) ×2 IMPLANT
WAND STAR VAC 90 (SURGICAL WAND) IMPLANT
WATER STERILE IRR 1000ML POUR (IV SOLUTION) ×2 IMPLANT

## 2018-12-17 NOTE — Anesthesia Procedure Notes (Signed)
Anesthesia Regional Block: Adductor canal block   Pre-Anesthetic Checklist: ,, timeout performed, Correct Patient, Correct Site, Correct Laterality, Correct Procedure,, site marked, risks and benefits discussed, Surgical consent,  Pre-op evaluation,  At surgeon's request and post-op pain management  Laterality: Left  Prep: chloraprep       Needles:  Injection technique: Single-shot  Needle Type: Echogenic Stimulator Needle     Needle Length: 9cm  Needle Gauge: 21     Additional Needles:   Procedures:,,,, ultrasound used (permanent image in chart),,,,  Narrative:  Start time: 12/17/2018 3:25 PM End time: 12/17/2018 3:35 PM Injection made incrementally with aspirations every 5 mL.  Performed by: Personally  Anesthesiologist: Murvin Natal, MD  Additional Notes: Functioning IV was confirmed and monitors were applied. A time-out was performed. Hand hygiene and sterile gloves were used. The thigh was placed in a frog-leg position and prepped in a sterile fashion. A 18mm 21ga Arrow echogenic stimulator needle was placed using ultrasound guidance.  Negative aspiration and negative test dose prior to incremental administration of local anesthetic. The patient tolerated the procedure well.

## 2018-12-17 NOTE — Op Note (Signed)
NAME: [name], [name] MEDICAL RECORD [id number] ACCOUNT [id number] DATE OF BIRTH:04/04/1966 FACILITY: [facility] LOCATION: [facility] PHYSICIAN:GREGORY Randel Pigg, MD  OPERATIVE REPORT  DATE OF PROCEDURE:  12/17/2018  PREOPERATIVE DIAGNOSIS:  Left knee medial meniscal tear and loose body.  POSTOPERATIVE DIAGNOSIS:  Left knee medial meniscal tear and loose body.  PROCEDURE:  Left knee arthroscopy with partial posterior medial meniscectomy and removal of loose body.  SURGEON:  Meredith Pel, MD  ASSISTANT:  Annie Main, PA  INDICATIONS:  This is a 53 year old patient with significant left knee pain, who presents for operative management after explanation of risks and benefits.  MRI scan shows meniscal tear and loose body.  PROCEDURE IN DETAIL:  The patient was brought to the operating room where general anesthetic was induced.  Preoperative antibiotics administered.  Timeout was called.  The left knee was examined under anesthesia and found to have full flexion, full  extension and about 2-3 mm of anterior laxity with ACL testing that he did have an endpoint.  No posterolateral rotatory instability was noted.  After examination under anesthesia, sterile prepping and draping and calling of timeout, the anterior  inferior medial portal was established under direct visualization.  Diagnostic arthroscopy was performed.  The patient had significant wear in the landing zone of the trochlea, grade IV chondromalacia diffusely over about a dime size area with  corresponding changes on the undersurface of the patella.  No loose bodies medial and lateral gutter.  ACL had partial tearing as well as mucoid degeneration.  About 50%-60% of the fibers were intact.  PCL was intact.  The lateral compartment had mild  meniscal fraying, but no discrete tear.  Articular cartilage was intact on that side.  On the medial side, there was diffuse grade II to III chondromalacia over essentially the entire  weightbearing surface area.  No exposed subchondral bone.  There was a  tear of the posterior horn of the medial meniscus involving about 50% anterior, posterior width of the meniscus.  This was debrided back to a stable rim using a combination of basket punch and shaver.  There was also a loose body that was moderately  embedded at the base of the ACL.  This was debrided with a shaver.  This was a loose bony fragment which had mobility within the soft tissue at the base of the ACL anteriorly.  Following this, a thorough irrigation was performed.  Instruments were  removed and portals were closed using 3-0 nylon.  A solution of Marcaine, morphine, clonidine injected into the knee for postop pain relief.  Impervious dressings and Ace wrap applied.  The patient tolerated the procedure well without immediate  complications.  He was transferred to the recovery room in stable condition.  TN/NUANCE  D:12/17/2018 T:12/17/2018 JOB:[id number]

## 2018-12-17 NOTE — H&P (Signed)
[name] is an 53 y.o. male.   Chief Complaint: Left knee pain HPI: [name] is a patient with several months history of left knee pain.  Reports mechanical and locking symptoms.  MRI scan shows meniscal pathology as well as loose body in the anterior compartment.  Presents now for operative management after failure of conservative management including activity modification and injection.  Patient has had right total knee and revision right total knee performed on the right hand side.  Past Medical History:  Diagnosis Date  . Anxiety   . Anxiety and depression   . Arthritis    knees and shoulders  . Bruit    L  . Chest pain    hx  . Diabetes mellitus without complication ([place])    "borderline", diet controlled, no meds, patient has lost 30 lbs  . Edema   . Fatty liver   . GERD (gastroesophageal reflux disease)    uses Omeprazole  . Goiter   . HLD (hyperlipidemia)   . Hypertension    essential, benign  . Hypothyroidism   . Impotence of organic origin   . Murmur    never has caused any problems  . Neuromuscular disorder ([place])   . Other chest pain    tightness, pressure  . Palpitation    hx  . Precordial pain   . Sleep apnea    uses cpap    Past Surgical History:  Procedure Laterality Date  . APPENDECTOMY  2002   done at [facility]  . CARDIAC CATHETERIZATION    . CARDIAC CATHETERIZATION N/A 08/27/2015   Procedure: Left Heart Cath and Coronary Angiography;  Surgeon: Jolaine Artist, MD;  Location: [place] [facility];  Service: Cardiovascular;  Laterality: N/A;  . COLONOSCOPY    . HERNIA REPAIR  [id number]   umbilical  . JOINT REPLACEMENT     right knee  . REVISION TOTAL KNEE ARTHROPLASTY Right 06/23/2014   DR Marlou Sa  . TONSILLECTOMY    . TOTAL KNEE ARTHROPLASTY Right 2011   right  . TOTAL KNEE REVISION Right 06/23/2014   Procedure: TOTAL KNEE REVISION;  Surgeon: Meredith Pel, MD;  Location: [place];  Service: Orthopedics;  Laterality: Right;  . WISDOM TOOTH  EXTRACTION      Family History  Problem Relation Age of Onset  . Heart disease Father    Social History:  reports that he quit smoking about 13 years ago. His smoking use included cigarettes. He has a 36.00 pack-year smoking history. He has never used smokeless tobacco. He reports current alcohol use of about 8.0 - 9.0 standard drinks of alcohol per week. He reports that he does not use drugs.  Allergies:  Allergies  Allergen Reactions  . Ambien [Zolpidem] Swelling and Other (See Comments)    Swelling in the throat    No medications prior to admission.    No results found for this or any previous visit (from the past 48 hour(s)). No results found.  Review of Systems  Musculoskeletal: Positive for joint pain.  All other systems reviewed and are negative.   Height 6' (1.829 m), weight 97.5 kg. Physical Exam  Constitutional: He appears well-developed.  HENT:  Head: Normocephalic.  Eyes: Pupils are equal, round, and reactive to light.  Neck: Normal range of motion.  Cardiovascular: Normal rate.  Respiratory: Effort normal.  Neurological: He is alert.  Skin: Skin is warm.  Psychiatric: He has a normal mood and affect.  Examination of the left knee demonstrates  trace effusion.  Extensor mechanism is intact.  Collateral and cruciate ligaments are stable.  Pedal pulses palpable.  Range of motion is from about full extension to 120 of flexion.  Medial joint line tenderness is present.  Assessment/Plan Impression is left knee meniscal pathology and tearing.  Plain radiographs show minimal arthritis but significant degenerative tearing present in the meniscus by MRI scanning.  Patient is failed conservative management.  Presents now for operative management after explanation of risks and benefits.  Risks and benefits are discussed include not limited to infection nerve vessel damage incomplete pain relief as well as potential need for more surgery in the future.  Patient does have  right total knee replacement.  All questions answered  Joshua Malta, MD 12/17/2018, 11:26 AM

## 2018-12-17 NOTE — Anesthesia Postprocedure Evaluation (Signed)
Anesthesia Post Note  Patient: [name]  Procedure(s) Performed: left knee arthroscopy, meniscal debridement, loose body removal (Left )     Patient location during evaluation: PACU Anesthesia Type: General and Regional Level of consciousness: awake and alert Pain management: pain level controlled Vital Signs Assessment: post-procedure vital signs reviewed and stable Respiratory status: spontaneous breathing, nonlabored ventilation, respiratory function stable and patient connected to nasal cannula oxygen Cardiovascular status: blood pressure returned to baseline and stable Postop Assessment: no apparent nausea or vomiting Anesthetic complications: no    Last Vitals:  Vitals:   12/17/18 1745 12/17/18 1800  BP: (!) 148/92 (!) 167/96  Pulse: 90 90  Resp: 14 (!) 24  Temp: 36.6 C (P) 36.6 C  SpO2: 92% 95%    Last Pain:  Vitals:   12/17/18 1800  TempSrc:   PainSc: 0-No pain                 Esmerelda Finnigan [address]

## 2018-12-17 NOTE — Transfer of Care (Signed)
Immediate Anesthesia Transfer of Care Note  Patient: [name]  Procedure(s) Performed: left knee arthroscopy, meniscal debridement, loose body removal (Left )  Patient Location: PACU  Anesthesia Type:GA combined with regional for post-op pain  Level of Consciousness: awake and patient cooperative  Airway & Oxygen Therapy: Patient Spontanous Breathing  Post-op Assessment: Report given to RN and Post -op Vital signs reviewed and stable  Post vital signs: Reviewed and stable  Last Vitals:  Vitals Value Taken Time  BP 148/92 12/17/18 1746  Temp    Pulse 91 12/17/18 1747  Resp 17 12/17/18 1747  SpO2 92 % 12/17/18 1747  Vitals shown include unvalidated device data.  Last Pain:  Vitals:   12/17/18 1317  TempSrc:   PainSc: 2       Patients Stated Pain Goal: 2 ([id number])  Complications: No apparent anesthesia complications

## 2018-12-17 NOTE — Brief Op Note (Signed)
   12/17/2018  5:41 PM  PATIENT:  [name]  53 y.o. male  PRE-OPERATIVE DIAGNOSIS:  left knee medial meniscal tear and loose body  POST-OPERATIVE DIAGNOSIS:  left knee medial meniscal tear and loose body  PROCEDURE:  Procedure(s): left knee arthroscopy, meniscal debridement, loose body removal  SURGEON:  Surgeon(s): Marlou Sa, Tonna Corner, MD  ASSISTANT: Magnant pa  ANESTHESIA:   general  EBL: 12 ml    Total I/O In: 500 [I.V.:500] Out: 710 [Urine:700; Blood:10]  BLOOD ADMINISTERED: none  DRAINS: none   LOCAL MEDICATIONS USED:  Marcaine mso4 clonidine  SPECIMEN:  No Specimen  COUNTS:  YES  TOURNIQUET:  * No tourniquets in log *  DICTATION: .Other Dictation: Dictation Number [id number]  PLAN OF CARE: Discharge to home after PACU  PATIENT DISPOSITION:  PACU - hemodynamically stable

## 2018-12-17 NOTE — Anesthesia Procedure Notes (Signed)
Procedure Name: LMA Insertion Date/Time: 12/17/2018 4:34 PM Performed by: Lance Coon, CRNA Pre-anesthesia Checklist: Patient identified, Emergency Drugs available, Suction available, Patient being monitored and Timeout performed Patient Re-evaluated:Patient Re-evaluated prior to induction Oxygen Delivery Method: Circle system utilized Preoxygenation: Pre-oxygenation with 100% oxygen Induction Type: IV induction LMA: LMA inserted LMA Size: 5.0 Number of attempts: 1 Placement Confirmation: positive ETCO2 and breath sounds checked- equal and bilateral Dental Injury: Teeth and Oropharynx as per pre-operative assessment

## 2018-12-18 ENCOUNTER — Encounter (HOSPITAL_COMMUNITY): Payer: Self-pay | Admitting: Orthopedic Surgery

## 2018-12-21 DIAGNOSIS — M2342 Loose body in knee, left knee: Secondary | ICD-10-CM

## 2018-12-21 DIAGNOSIS — S83242A Other tear of medial meniscus, current injury, left knee, initial encounter: Secondary | ICD-10-CM

## 2018-12-23 ENCOUNTER — Telehealth: Payer: Self-pay | Admitting: Orthopedic Surgery

## 2018-12-23 NOTE — Telephone Encounter (Signed)
Patient called wanting to pickup a copy if his records at his appt on Wed. 9/2. I told him we would have them ready and need to sign release form at that time.

## 2018-12-25 ENCOUNTER — Encounter: Payer: Self-pay | Admitting: Orthopedic Surgery

## 2018-12-25 ENCOUNTER — Telehealth: Payer: Self-pay

## 2018-12-25 ENCOUNTER — Ambulatory Visit (INDEPENDENT_AMBULATORY_CARE_PROVIDER_SITE_OTHER): Payer: PRIVATE HEALTH INSURANCE | Admitting: Orthopedic Surgery

## 2018-12-25 ENCOUNTER — Other Ambulatory Visit: Payer: Self-pay

## 2018-12-25 DIAGNOSIS — S838X2D Sprain of other specified parts of left knee, subsequent encounter: Secondary | ICD-10-CM

## 2018-12-25 MED ORDER — OXYCODONE HCL 5 MG PO TABS: 5.0000 mg | ORAL_TABLET | Freq: Four times a day (QID) | ORAL | 0 refills | Status: DC | PRN

## 2018-12-25 NOTE — Telephone Encounter (Signed)
Pt called and states that [facility] in [place] is needing a new rx for her oxycodone. He states that he ws in the office today and forgot to ask about this. That he had knee surgery and that he is needing to have this sent to them. If you have any questions or just to advise of approval of refill please call pt.

## 2018-12-25 NOTE — Telephone Encounter (Signed)
Did you or Dr Marlou Sa see this patient? If so can you please take care of his medication. Thanks.

## 2018-12-25 NOTE — Progress Notes (Signed)
Post-Op Visit Note   Patient: [name]           Date of Birth: 1965/10/01           MRN: [name] Visit Date: 12/25/2018 PCP: [name], MD   Assessment & Plan:  Chief Complaint:  Chief Complaint  Patient presents with  . Left Knee - Routine Post Op   Visit Diagnoses:  1. Injury of meniscus of left knee, subsequent encounter     Plan: Shaquell is a patient who is now a week out left knee arthroscopy partial medial meniscectomy and removal of loose body.  Patient is been doing reasonably well.  He had partial tearing of his ACL also.  On exam ACL is stable.  Moderate effusion is present and that is aspirated today.  We got about 65 cc of blood out.  He is flexes easily past 90.  Portal sutures removed.  Plan is non-loadbearing quad strengthening exercises on the stationary bike in 4-week return.  Follow-Up Instructions: No follow-ups on file.   Orders:  No orders of the defined types were placed in this encounter.  Meds ordered this encounter  Medications  . DISCONTD: oxyCODONE (ROXICODONE) 5 MG immediate release tablet    Sig: Take 1 tablet (5 mg total) by mouth every 6 (six) hours as needed.    Dispense:  30 tablet    Refill:  0  . oxyCODONE (ROXICODONE) 5 MG immediate release tablet    Sig: Take 1 tablet (5 mg total) by mouth every 6 (six) hours as needed.    Dispense:  30 tablet    Refill:  0    Imaging: No results found.  PMFS History: Patient Active Problem List   Diagnosis Date Noted  . Loose body of left knee   . Acute medial meniscus tear of left knee   . Angioedema of lips 11/26/2016  . Cognitive complaints with normal neuropsychological exam 10/23/2016  . 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 Hypothyroidism due to acquired atrophy of thyroid 09/14/2015  . Arthritis of knee 06/23/2014  . Encounter for preventive health  examination 06/17/2014  . Enlarged RV (right ventricle) 06/17/2014  . Cervical spine degeneration 02/17/2014  . Overweight 10/29/2013  . Chronic knee pain 10/29/2013  . Diabetes mellitus type 2 in obese ([place]) 10/23/2012  . Seborrheic dermatitis, unspecified 05/17/2012  . Fatty liver 04/22/2012  . Sciatica of right side 12/24/2010  . Hyperlipidemia associated with type 2 diabetes mellitus ([place]) 01/13/2009  . Anxiety state 01/13/2009  . Essential hypertension 01/13/2009  . OSA (obstructive sleep apnea) 01/13/2009   Past Medical History:  Diagnosis Date  . Anxiety   . Anxiety and depression   . Arthritis    knees and shoulders  . Bruit    L  . Chest pain    hx  . Diabetes mellitus without complication ([place])    "borderline", diet controlled, no meds, patient has lost 30 lbs  . Edema   . Fatty liver   . GERD (gastroesophageal reflux disease)    uses Omeprazole  . Goiter   . HLD (hyperlipidemia)   . Hypertension    essential, benign  . Hypothyroidism   . Impotence of organic origin   . Murmur    never has caused any problems  . Neuromuscular disorder ([place])   . Other chest pain    tightness, pressure  .  Palpitation    hx  . Precordial pain   . Sleep apnea    uses cpap    Family History  Problem Relation Age of Onset  . Heart disease Father     Past Surgical History:  Procedure Laterality Date  . APPENDECTOMY  2002   done at [facility]  . CARDIAC CATHETERIZATION    . CARDIAC CATHETERIZATION N/A 08/27/2015   Procedure: Left Heart Cath and Coronary Angiography;  Surgeon: Jolaine Artist, MD;  Location: [place] [facility];  Service: Cardiovascular;  Laterality: N/A;  . COLONOSCOPY    . HERNIA REPAIR  [id number]   umbilical  . JOINT REPLACEMENT     right knee  . KNEE ARTHROSCOPY Left 12/17/2018   Procedure: left knee arthroscopy, meniscal debridement, loose body removal;  Surgeon: Meredith Pel, MD;  Location: [place];  Service: Orthopedics;  Laterality: Left;  .  REVISION TOTAL KNEE ARTHROPLASTY Right 06/23/2014   DR Marlou Sa  . TONSILLECTOMY    . TOTAL KNEE ARTHROPLASTY Right 2011   right  . TOTAL KNEE REVISION Right 06/23/2014   Procedure: TOTAL KNEE REVISION;  Surgeon: Meredith Pel, MD;  Location: [place];  Service: Orthopedics;  Laterality: Right;  . WISDOM TOOTH EXTRACTION     Social History   Occupational History  . Occupation: IT trainer  . Occupation: SERVICE DIRECTOR    Employer: [employer]  Tobacco Use  . Smoking status: Former Smoker    Packs/day: 2.00    Years: 18.00    Pack years: 36.00    Types: Cigarettes    Quit date: 04/24/2005    Years since quitting: 13.6  . Smokeless tobacco: Never Used  . Tobacco comment: quit 20 years ago   Substance and Sexual Activity  . Alcohol use: Yes    Alcohol/week: 8.0 - 9.0 standard drinks    Types: 6 Cans of beer, 2 - 3 Shots of liquor per week    Comment: beer/liquor  . Drug use: No  . Sexual activity: Yes

## 2018-12-27 ENCOUNTER — Telehealth: Payer: Self-pay | Admitting: *Deleted

## 2018-12-27 NOTE — Telephone Encounter (Signed)
DOS 01/02/2019 KELLER BUNION IMPLANT - [id number] AND METATARSAL OSTEOTOMY 5TH - [id number] LT FOOT  AMBETTER OF : Effective Date - 04/24/2018  Jonelle Sidle ref. # [id number] Deductible - $0 Co-insurance - 75% / 25% Out of pocket - [id number]  Pre-certification is NOT REQUIRED, per Mordecai Maes Ref. # [id number]

## 2018-12-31 ENCOUNTER — Encounter: Payer: Self-pay | Admitting: Orthopaedic Surgery

## 2018-12-31 ENCOUNTER — Ambulatory Visit (INDEPENDENT_AMBULATORY_CARE_PROVIDER_SITE_OTHER): Payer: PRIVATE HEALTH INSURANCE | Admitting: Orthopaedic Surgery

## 2018-12-31 VITALS — BP 126/99 | HR 88 | Ht 72.0 in | Wt 215.0 lb

## 2018-12-31 DIAGNOSIS — R200 Anesthesia of skin: Secondary | ICD-10-CM | POA: Diagnosis not present

## 2018-12-31 DIAGNOSIS — Q761 Klippel-Feil syndrome: Secondary | ICD-10-CM | POA: Diagnosis not present

## 2018-12-31 DIAGNOSIS — M4802 Spinal stenosis, cervical region: Secondary | ICD-10-CM | POA: Diagnosis not present

## 2018-12-31 DIAGNOSIS — M47812 Spondylosis without myelopathy or radiculopathy, cervical region: Secondary | ICD-10-CM | POA: Diagnosis not present

## 2018-12-31 DIAGNOSIS — R202 Paresthesia of skin: Secondary | ICD-10-CM

## 2018-12-31 NOTE — Progress Notes (Signed)
Office Visit Note   Patient: [name]           Date of Birth: [birth year]           MRN: [name] Visit Date: 12/31/2018              Requested by: [name], MD [address] [place],  [place] [address] PCP: [name], MD   Assessment & Plan: Visit Diagnoses:  1. Numbness and tingling in left arm   2. Osteoarthritis of cervical spine, unspecified spinal osteoarthritis complication status   3. Klippel-Feil deformity   4. Spinal stenosis of cervical region     Plan: I recommend proceeding with nerve conduction velocity and make sure he does not have accompanying carpal tunnel syndrome since he has numbness in his radial fingers.  If electrical test show no evidence of carpal tunnel and single level cervical fusion with take care of his problem which was labeled by the radiologist at C5-6.  MRI scan is reviewed again with copy of the report.  Plan would be single level cervical fusion at C5-6 with allograft and plate overnight stay in the hospital and carpal tunnel release included if he has carpal tunnel syndrome as well.  We discussed double crush problem which may be potentially contributing to his left hand numbness.  Follow-Up Instructions: I will call patient after his electrical tests are completed.  He can then decide on if he like to proceed with surgical recommendations.  Orders:  Orders Placed This Encounter  Procedures  . Ambulatory referral to Physical Medicine Rehab   No orders of the defined types were placed in this encounter.     Procedures: No procedures performed   Clinical Data: No additional findings.   Subjective: Chief Complaint  Patient presents with  . Neck - Pain    HPI 53 year old male recently had knee arthroscopy by Dr. Marlou Sa is now having problems with progressive neck pain for greater than a year with left arm pain numbness and tingling with weakness.  He is right-hand dominant states at times he has trouble  holding objects.  He is also noticed some tingling in his legs.  MRI scan lumbar showed shallow left lateral recess and foraminal protrusion L3-4 causing mild narrowing without nerve root compression.  Cervical MRI scan 12/06/2018 showed Klippel-Feil deformity C2-3 which is fused.  Spondylosis which is labeled by radiologist as C5-6 with disc bulge right paracentral protrusion with moderate stenosis and flattening of the ventral spinal cord.  Moderate to severe left neuroforaminal stenosis at that level.  Patient's been on ibuprofen with not a lot of relief.  Patient not working.  Positive for pression and anxiety, file for disability has an attorney.  Review of Systems positive for cervical spondylosis with moderate cervical stenosis C5-6.  Depression, type 2 diabetes, hypertension, enlarged right ventricle, hyperlipidemia hypothyroidism and sleep apnea.   Objective: Vital Signs: BP (!) 126/99   Pulse 88   Ht 6' (1.829 m)   Wt 215 lb (97.5 kg)   BMI 29.16 kg/m   Physical Exam Constitutional:      Appearance: He is well-developed.  HENT:     Head: Normocephalic and atraumatic.  Eyes:     Pupils: Pupils are equal, round, and reactive to light.  Neck:     Thyroid: No thyromegaly.     Trachea: No tracheal deviation.  Cardiovascular:     Rate and Rhythm: Normal rate.  Pulmonary:  Effort: Pulmonary effort is normal.     Breath sounds: No wheezing.  Abdominal:     General: Bowel sounds are normal.     Palpations: Abdomen is soft.  Skin:    General: Skin is warm and dry.     Capillary Refill: Capillary refill takes less than 2 seconds.  Neurological:     Mental Status: He is alert and oriented to person, place, and time.  Psychiatric:        Behavior: Behavior normal.        Thought Content: Thought content normal.        Judgment: Judgment normal.     Ortho Exam patient has brachial plexus tenderness left more than right side positive Spurling on the left.  Negative Lhermitte  no lower extremity hyperreflexia.  Negative for shoulder impingement.  He does have some pain with carpal compression on the left more than right.  Biceps triceps deltoid wrist extension flexion is normal normal pronation supination.  Negative for lower extremity clonus.  Specialty Comments:  No specialty comments available.  Imaging: CLINICAL DATA:  Neck pain, initial exam, evaluate for source of left radicular arm pain  EXAM: MRI CERVICAL SPINE WITHOUT CONTRAST  TECHNIQUE: Multiplanar, multisequence MR imaging of the cervical spine was performed. No intravenous contrast was administered.  COMPARISON:  Cervical spine radiographs 08/16/2018, cervical spine MRI 11/26/2013  FINDINGS: Multiple sequences are motion degraded.  Alignment: Straightening of the expected cervical lordosis. No significant spondylolisthesis  Vertebrae: Congenital segmentation anomaly within the upper cervical spine in the form of incomplete C2-C3 segmentation. By this numbering system, the superior most ribs are designated T1. Vertebral body height is maintained. No suspicious osseous lesions.  Cord: Within the limitations of motion degradation, no spinal cord signal abnormality identified.  Posterior Fossa, vertebral arteries, paraspinal tissues: No abnormality  Disc levels:  Mild multilevel disc degeneration greatest at C5-C6.  C2-C3: Congenital anomaly with incomplete segmentation. No significant spinal canal narrowing.  C3-C4: Disc bulge. Right greater than left disc osteophyte ridge/uncinate hypertrophy. Facet hypertrophy (also greater on the right). No significant spinal canal stenosis. Moderate right with mild left neural foraminal narrowing. Findings are similar to prior MRI.  C4-C5: Disc bulge. Uncinate/facet hypertrophy. Mild spinal canal stenosis. Mild right with moderate/severe left neural foraminal narrowing. Findings are similar to prior MRI.  C5-C6: Interval  progression of disc bulge with superimposed small right center disc protrusion. Bilateral disc osteophyte ridge/uncinate hypertrophy. Facet hypertrophy. Moderate spinal canal stenosis with contact upon and mild flattening of the ventral spinal cord. Moderate/severe left neural foraminal narrowing also appears progressed from prior.  C6-C7: Mild uncinate/facet hypertrophy is new from prior exam. No significant disc herniation, spinal canal or neural foraminal narrowing.  C7-T1: No significant disc herniation.No significant spinal canal or neural foraminal narrowing.  IMPRESSION: Motion degraded exam.  Redemonstrated C2-C3 congenital segmentation anomaly.  C5-C6 spondylosis has progressed since prior MRI 11/26/2013. At this level, a disc bulge with superimposed small right center disc protrusion contribute to moderate spinal canal stenosis with mild flattening of the ventral spinal cord. Moderate/severe left neural foraminal narrowing at this level, also progressed.  No more than mild spinal canal stenosis at the remaining levels. Additional sites of neural foraminal narrowing as described, and greatest on the right at C3-C4 (moderate) and on the left at C4-C5 (moderate/severe).   Electronically Signed   By: Kellie Simmering   On: 12/06/2018 09:00    PMFS History: Patient Active Problem List   Diagnosis Date Noted  .  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 Spinal stenosis of cervical region 12/31/2018  . Loose body of left knee   . Acute medial meniscus tear of left knee   . Angioedema of lips 11/26/2016  . Cognitive complaints with normal neuropsychological exam 10/23/2016  . 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 Hypothyroidism due to acquired atrophy of thyroid 09/14/2015  . Arthritis of knee 06/23/2014  . Encounter for preventive health  examination 06/17/2014  . Enlarged RV (right ventricle) 06/17/2014  . Cervical spine degeneration 02/17/2014  . Overweight 10/29/2013  . Chronic knee pain 10/29/2013  . Diabetes mellitus type 2 in obese ([place]) 10/23/2012  . Seborrheic dermatitis, unspecified 05/17/2012  . Fatty liver 04/22/2012  . Sciatica of right side 12/24/2010  . Hyperlipidemia associated with type 2 diabetes mellitus ([place]) 01/13/2009  . Anxiety state 01/13/2009  . Essential hypertension 01/13/2009  . OSA (obstructive sleep apnea) 01/13/2009   Past Medical History:  Diagnosis Date  . Anxiety   . Anxiety and depression   . Arthritis    knees and shoulders  . Bruit    L  . Chest pain    hx  . Diabetes mellitus without complication ([place])    "borderline", diet controlled, no meds, patient has lost 30 lbs  . Edema   . Fatty liver   . GERD (gastroesophageal reflux disease)    uses Omeprazole  . Goiter   . HLD (hyperlipidemia)   . Hypertension    essential, benign  . Hypothyroidism   . Impotence of organic origin   . Murmur    never has caused any problems  . Neuromuscular disorder ([place])   . Other chest pain    tightness, pressure  . Palpitation    hx  . Precordial pain   . Sleep apnea    uses cpap    Family History  Problem Relation Age of Onset  . Heart disease Father     Past Surgical History:  Procedure Laterality Date  . APPENDECTOMY  2002   done at [facility]  . CARDIAC CATHETERIZATION    . CARDIAC CATHETERIZATION N/A 08/27/2015   Procedure: Left Heart Cath and Coronary Angiography;  Surgeon: Jolaine Artist, MD;  Location: [facility];  Service: Cardiovascular;  Laterality: N/A;  . COLONOSCOPY    . HERNIA REPAIR  [id number]   umbilical  . JOINT REPLACEMENT     right knee  . KNEE ARTHROSCOPY Left 12/17/2018   Procedure: left knee arthroscopy, meniscal debridement, loose body removal;  Surgeon: Meredith Pel, MD;  Location: [facility];  Service: Orthopedics;  Laterality: Left;  .  REVISION TOTAL KNEE ARTHROPLASTY Right 06/23/2014   DR Marlou Sa  . TONSILLECTOMY    . TOTAL KNEE ARTHROPLASTY Right 2011   right  . TOTAL KNEE REVISION Right 06/23/2014   Procedure: TOTAL KNEE REVISION;  Surgeon: Meredith Pel, MD;  Location: [facility];  Service: Orthopedics;  Laterality: Right;  . WISDOM TOOTH EXTRACTION     Social History   Occupational History  . Occupation: IT trainer  . Occupation: SERVICE DIRECTOR    Employer: [employer]  Tobacco Use  . Smoking status: Former Smoker    Packs/day: 2.00    Years: 18.00    Pack years: 36.00    Types: Cigarettes    Quit date: 04/24/2005    Years since  quitting: 13.6  . Smokeless tobacco: Never Used  . Tobacco comment: quit 20 years ago   Substance and Sexual Activity  . Alcohol use: Yes    Alcohol/week: 8.0 - 9.0 standard drinks    Types: 6 Cans of beer, 2 - 3 Shots of liquor per week    Comment: beer/liquor  . Drug use: No  . Sexual activity: Yes

## 2019-01-01 ENCOUNTER — Telehealth: Payer: Self-pay | Admitting: Podiatry

## 2019-01-01 ENCOUNTER — Telehealth: Payer: Self-pay | Admitting: *Deleted

## 2019-01-01 NOTE — Telephone Encounter (Signed)
"  Mr. [name] just called and canceled his surgery for tomorrow."  Why did he cancel?  "He said it was for personal reasons."  I'll let Dr. Amalia Hailey know.  I canceled the surgery scheduled for 01/02/2019 via the surgical center's [facility].

## 2019-01-01 NOTE — Telephone Encounter (Signed)
Patient called, he is scheduled for surgery tomorrow and needs to cancel due to a family emergency. Pt said he had already spoken to the surgical center but wanted to let our office know as well. Pt does want to r/s for a later date, please give patient a call.

## 2019-01-01 NOTE — Telephone Encounter (Signed)
"  I'm supposed to be having surgery tomorrow with Dr. Amalia Hailey at the [facility] in [place].  I'm not going to be able to make the surgery.  I'm going to have to get it rescheduled.  I got some personal stuff going on, I'm just not going to be able to do it.  I've already called the hospital and talked to them.  Call me back at your earliest convenience so I can reschedule as soon as possible?"

## 2019-01-06 ENCOUNTER — Other Ambulatory Visit: Payer: Self-pay | Admitting: Internal Medicine

## 2019-01-06 ENCOUNTER — Telehealth: Payer: Self-pay | Admitting: Internal Medicine

## 2019-01-06 ENCOUNTER — Telehealth: Payer: Self-pay | Admitting: Podiatry

## 2019-01-06 ENCOUNTER — Other Ambulatory Visit: Payer: Self-pay | Admitting: Podiatry

## 2019-01-06 DIAGNOSIS — F411 Generalized anxiety disorder: Secondary | ICD-10-CM

## 2019-01-06 MED ORDER — CLONAZEPAM 0.5 MG PO TABS: 0.5000 mg | ORAL_TABLET | Freq: Two times a day (BID) | ORAL | 0 refills | Status: DC | PRN

## 2019-01-06 NOTE — Telephone Encounter (Signed)
Patient aware that Rx for current dosage of clonazepam has been sent to pharmacy.  Patient scheduled a virtual visit w/ Dr. Derrel Nip on 01/08/19 @ 9:30 am to discuss increasing dosage of medication.

## 2019-01-06 NOTE — Telephone Encounter (Signed)
Pt is calling about rescheduling his surgery with Dr. Amalia Hailey. Pt states he has left several messages.

## 2019-01-06 NOTE — Telephone Encounter (Signed)
I am returning your call.  I apologize for not calling you sooner.  We have been short staffed her so I have been filling in as an Environmental consultant.  Dr. Rebekah Chesterfield next available date is not going to be until February 20, 2019.  "No worries, I understand.  Put me down for October 29.  Is there anyway possible my surgery can be closer to the afternoon?  I [place] half away.  It's hard for me to get there early in the morning."  From the way things are looking, you may need to be there around 10:45 am but this is just a guess.  "That's fine.  Thank you so much.  I hate I had to cancel it before."  I rescheduled the surgery to 02/20/2019 via the surgical center's [facility] Portal.

## 2019-01-06 NOTE — Telephone Encounter (Signed)
Refill for Cymbalta sent.   Last Refill:  clonazepam on 11/28/18  Last OV:  07/30/18  Patient's last dose of clonazepam was on yesterday.  Patient was due for his next dosage this morning.    Patient noticed that his hands are shaking and some increased anxiety.  Patient said that his psychiatrist Dr. Berniece Andreas recommended that his clonazepam be increased to 1 mg.

## 2019-01-06 NOTE — Telephone Encounter (Signed)
Patient called again regarding the Cymbalta and Klonopin. A request is already pending. Patient states that his hands are shaking and he needs refill right away. Patient will make appointment if he needs too

## 2019-01-06 NOTE — Telephone Encounter (Signed)
Last Refill on Klonopin:  11/28/18  Last OV:  07/30/18

## 2019-01-06 NOTE — Telephone Encounter (Signed)
Doses on controlled substances are not increased withoyut an office visit.  Refilled at prior dose for 30 days

## 2019-01-06 NOTE — Telephone Encounter (Signed)
Last Refills:  clonazepam on 11/28/18  Cymbalta on 09/04/18  Last OV:  07/30/18

## 2019-01-06 NOTE — Telephone Encounter (Signed)
clonazePAM (KLONOPIN) 0.5 MG tablet  DULoxetine (CYMBALTA) 60 MG capsule  [place], [address] [phone] (Phone) [phone] (Fax)   Pt states that hands are shaking and needs these refills, he states that if he needs an appt he is willing to do so but needs these asap as pharmacy is sending over

## 2019-01-08 ENCOUNTER — Other Ambulatory Visit: Payer: Self-pay

## 2019-01-08 ENCOUNTER — Encounter: Payer: PRIVATE HEALTH INSURANCE | Admitting: Podiatry

## 2019-01-08 ENCOUNTER — Encounter: Payer: Self-pay | Admitting: Internal Medicine

## 2019-01-08 ENCOUNTER — Ambulatory Visit (INDEPENDENT_AMBULATORY_CARE_PROVIDER_SITE_OTHER): Payer: PRIVATE HEALTH INSURANCE | Admitting: Internal Medicine

## 2019-01-08 DIAGNOSIS — E785 Hyperlipidemia, unspecified: Secondary | ICD-10-CM

## 2019-01-08 DIAGNOSIS — E034 Atrophy of thyroid (acquired): Secondary | ICD-10-CM | POA: Diagnosis not present

## 2019-01-08 DIAGNOSIS — I10 Essential (primary) hypertension: Secondary | ICD-10-CM

## 2019-01-08 DIAGNOSIS — E1169 Type 2 diabetes mellitus with other specified complication: Secondary | ICD-10-CM

## 2019-01-08 DIAGNOSIS — K760 Fatty (change of) liver, not elsewhere classified: Secondary | ICD-10-CM

## 2019-01-08 DIAGNOSIS — F13239 Sedative, hypnotic or anxiolytic dependence with withdrawal, unspecified: Secondary | ICD-10-CM

## 2019-01-08 DIAGNOSIS — F411 Generalized anxiety disorder: Secondary | ICD-10-CM

## 2019-01-08 DIAGNOSIS — F13939 Sedative, hypnotic or anxiolytic use, unspecified with withdrawal, unspecified: Secondary | ICD-10-CM

## 2019-01-08 DIAGNOSIS — R944 Abnormal results of kidney function studies: Secondary | ICD-10-CM

## 2019-01-08 DIAGNOSIS — E669 Obesity, unspecified: Secondary | ICD-10-CM

## 2019-01-08 DIAGNOSIS — K860 Alcohol-induced chronic pancreatitis: Secondary | ICD-10-CM

## 2019-01-08 MED ORDER — CLONAZEPAM 1 MG PO TABS: 1.0000 mg | ORAL_TABLET | Freq: Two times a day (BID) | ORAL | 0 refills | Status: DC | PRN

## 2019-01-08 NOTE — Progress Notes (Signed)
Virtual Visit via [email or web address]  This visit type was conducted due to national recommendations for restrictions regarding the COVID-19 pandemic (e.g. social distancing).  This format is felt to be most appropriate for this patient at this time.  All issues noted in this document were discussed and addressed.  No physical exam was performed (except for noted visual exam findings with Video Visits).   I connected with@ on 01/08/19 at  9:30 AM EDT by a video enabled telemedicine application  and verified that I am speaking with the correct person using two identifiers. Location patient: home Location provider: work or home office Persons participating in the virtual visit: patient, provider  I discussed the limitations, risks, security and privacy concerns of performing an evaluation and management service by telephone and the availability of in person appointments. I also discussed with the patient that there may be a patient responsible charge related to this service. The patient expressed understanding and agreed to proceed.  Reason for visit: depression ,  Diabetes   HPI:  Seeing psychiatrist in [place],  [place] part of [place].  Taking lamictal   Still anxious, frequently tearful ,  Trouble focussing . Unemployed,  Awaiting disability hearing  Expedited to Oct 15. 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  Needs clonazepam dose increased per psychiatrist.   Wife tanya hospitalized 5 times this year,  Defibrillator  fired 5 times recently  Has not had diabetes/hypertension/hypothyroid follow up   In over a year.  Taking medications as directed  Does not check blood  sugars.   ROS: See pertinent positives and negatives per HPI.  Past Medical History:  Diagnosis Date  . 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 Anxiety   . Anxiety and depression   . Arthritis    knees and shoulders  . Bruit    L  . Chest pain    hx  . Diabetes mellitus without complication ([place])    "borderline", diet controlled, no meds, patient has lost 30 lbs  . Edema   . Fatty liver   . GERD (gastroesophageal reflux disease)    uses Omeprazole  . Goiter   . HLD (hyperlipidemia)   . Hypertension    essential, benign  . Hypothyroidism   . Impotence of organic origin   . Murmur    never has caused any problems  . Neuromuscular disorder ([place])   . Other chest pain    tightness, pressure  . Palpitation    hx  . Precordial pain   . Sleep apnea    uses cpap    Past Surgical History:  Procedure Laterality Date  . APPENDECTOMY  2002   done at [facility]  . CARDIAC CATHETERIZATION    . CARDIAC CATHETERIZATION N/A 08/27/2015   Procedure: Left Heart Cath and Coronary Angiography;  Surgeon: Jolaine Artist, MD;  Location: [facility];  Service: Cardiovascular;  Laterality: N/A;  . COLONOSCOPY    . HERNIA REPAIR  [id number]   umbilical  . JOINT REPLACEMENT     right knee  . KNEE ARTHROSCOPY Left 12/17/2018   Procedure: left knee arthroscopy, meniscal debridement, loose body removal;  Surgeon: Meredith Pel, MD;  Location: [facility];  Service: Orthopedics;  Laterality: Left;  . REVISION TOTAL KNEE ARTHROPLASTY Right 06/23/2014   DR Marlou Sa  . TONSILLECTOMY    . TOTAL KNEE ARTHROPLASTY Right 2011   right  . TOTAL KNEE REVISION Right 06/23/2014   Procedure: TOTAL KNEE REVISION;  Surgeon: Meredith Pel, MD;  Location: [facility];  Service: Orthopedics;  Laterality: Right;  . WISDOM TOOTH EXTRACTION      Family History  Problem Relation Age of Onset  . Heart disease Father     SOCIAL HX:  reports that he quit smoking about 13 years ago. His smoking use  included cigarettes. He has a 36.00 pack-year smoking history. He has never used smokeless tobacco. He reports current alcohol use of about 8.0 - 9.0 standard drinks of alcohol per week. He reports that he does not use drugs.   Current Outpatient Medications:  .  aspirin EC 81 MG tablet, Take 81 mg by mouth daily., Disp: , Rfl:  .  BAYER MICROLET LANCETS lancets, Use as instructed, Disp: 100 each, Rfl: 12 .  busPIRone (BUSPAR) 10 MG tablet, Take 1 tablet (10 mg total) by mouth 3 (three) times daily., Disp: 270 tablet, Rfl: 1 .  Clobetasol Propionate 0.05 % shampoo, Apply to dry scalp and leave on for 15 minutes (Patient taking differently: Apply 1 application topically See admin instructions. Apply to dry scalp and leave on for 15 minutes as needed for itching.), Disp: 118 mL, Rfl: 5 .  [START ON 01/22/2019] clonazePAM (KLONOPIN) 1 MG tablet, Take 1 tablet (1 mg total) by mouth 2 (two) times daily as needed. for anxiety, Disp: 60 tablet, Rfl: 0 .  dicyclomine (BENTYL) 20 MG tablet, TAKE 1 TABLET (20 MG TOTAL) BY MOUTH EVERY 6 (SIX) HOURS. (Patient taking differently: Take 20 mg by mouth 4 (four) times daily -  before meals and at bedtime. ), Disp: 120 tablet, Rfl: 5 .  DULoxetine (CYMBALTA) 60 MG capsule, TAKE 1 CAPSULE BY MOUTH ONCE DAILY WITH BREAKFAST, Disp: 90 capsule, Rfl: 0 .  gabapentin (NEURONTIN) 600 MG tablet, Take 1 tablet (600 mg total) by mouth 2 (two) times daily., Disp: 270 tablet, Rfl: 1 .  ibuprofen (ADVIL,MOTRIN) 200 MG tablet, Take 400 mg by mouth every 6 (six) hours as needed (pain)., Disp: , Rfl:  .  lamoTRIgine (LAMICTAL) 200 MG tablet, Take one tab daily (Patient taking differently: Take 200 mg by mouth daily. ), Disp: 30 tablet, Rfl: 2 .  levothyroxine (SYNTHROID) 88 MCG tablet, Take 1 tablet (88 mcg total) by mouth daily before breakfast., Disp: 90 tablet, Rfl: 0 .  losartan-hydrochlorothiazide (HYZAAR) 100-25 MG tablet, Take 1 tablet by mouth daily., Disp: 90 tablet, Rfl: 3 .   meloxicam (MOBIC) 15 MG tablet, Take 1 tablet by mouth once daily for 30 days, Disp: 60 tablet, Rfl: 0 .  methocarbamol (ROBAXIN) 500 MG tablet, Take 1 tablet (500 mg total) by mouth every 8 (eight) hours as needed., Disp: 35 tablet, Rfl: 0 .  metoprolol tartrate (LOPRESSOR) 25 MG tablet, Take 1 tablet (25 mg total) by mouth 2 (two) times daily., Disp: 60 tablet, Rfl: 5 .  Multiple Vitamin (MULTIVITAMIN WITH MINERALS) TABS tablet, Take 1 tablet by mouth daily., Disp: , Rfl:  .  omeprazole (PRILOSEC) 40 MG capsule, Take 1 capsule (40 mg total) by mouth 2 (two) times daily., Disp: 180 capsule, Rfl: 0 .  promethazine (PHENERGAN) 12.5 MG tablet, Take 1 tablet (12.5 mg total)  by mouth every 8 (eight) hours as needed for nausea or vomiting., Disp: 20 tablet, Rfl: 0 .  rosuvastatin (CRESTOR) 20 MG tablet, Take 1 tablet (20 mg total) by mouth daily., Disp: 90 tablet, Rfl: 1 .  traZODone (DESYREL) 50 MG tablet, TAKE 1-2 TABLETS BY MOUTH NIGHTLY AS NEEDED FOR SLEEP (Patient taking differently: Take 50-100 mg by mouth at bedtime as needed for sleep. ), Disp: 180 tablet, Rfl: 1 .  oxyCODONE (ROXICODONE) 5 MG immediate release tablet, Take 1 tablet (5 mg total) by mouth every 8 (eight) hours., Disp: 30 tablet, Rfl: 0  EXAM:  VITALS per patient if applicable:  GENERAL: alert, oriented, appears well and in no acute distress  HEENT: atraumatic, conjunttiva clear, no obvious abnormalities on inspection of external nose and ears  NECK: normal movements of the head and neck  LUNGS: on inspection no signs of respiratory distress, breathing rate appears normal, no obvious gross SOB, gasping or wheezing  CV: no obvious cyanosis  MS: moves all visible extremities without noticeable abnormality  PSYCH/NEURO: pleasant and cooperative, no obvious depression or anxiety, speech and thought processing grossly intact  ASSESSMENT AND PLAN:  Discussed the following assessment and plan:  Diabetes mellitus type 2 in  obese ([place])  Hypothyroidism due to acquired atrophy of thyroid  Essential hypertension  Decreased GFR  Hyperlipidemia associated with type 2 diabetes mellitus ([place])  Fatty liver  Anxiety state  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  Has been  well-controlled on diet alone  .   Patient is overdue for follow up and reminded to schedule an annual eye exam .  Patient has no history of microalbuminuria. Patient is tolerating statin therapy for CAD risk reduction and on ACE/ARB for renal protection and hypertension.   Hypothyroidism due to acquired atrophy of thyroid Thyroid function is WNL on current dose.  No current changes needed.   Lab Results  Component Value Date   TSH 1.647 01/09/2019     Essential hypertension Patient is taking his medications as prescribed and notes no adverse effects.  Home BP readings have been done about once per week and are  generally < 140/80 .  he is avoiding added salt in her diet and not walking regularly   Lab Results  Component Value Date   CREATININE 1.25 (H) 01/09/2019   Lab Results  Component Value Date   NA 139 01/09/2019   K 4.6 01/09/2019   CL 103 01/09/2019   CO2 26 01/09/2019     Decreased GFR Likely due to fasting state.  Cr will need to be repeated when not fasting   Lab Results  Component Value Date   CREATININE 1.25 (H) 01/09/2019     Hyperlipidemia associated with type 2 diabetes mellitus Managed with rosuvastatin.  Direct LDL is at goal.   Lab Results  Component Value Date   CHOL 132 01/09/2019   HDL 33 (L) 01/09/2019   LDLCALC 44 01/09/2019   LDLDIRECT 101.0 10/16/2017   TRIG 277 (H) 01/09/2019   CHOLHDL 4.0 01/09/2019     Fatty liver Liver enzymes normalized with alcohol abstinence and weight loss .  Continue statin.  Will start metformin if a1c is > 6.5    Lab Results  Component Value Date   ALT 29 01/09/2019   AST 34 01/09/2019   ALKPHOS 111 01/09/2019   BILITOT 0.5 01/09/2019  Anxiety state Aggravated by financial hardship,  Physical disability,  And his wife Tanya's deteriorating medical condition as she waits for a heart transplant.  He is under the care of a local psychiatrist who has recommended an increase in his clonazepam dose,  Which I agree with and will do ,  To 1 mg bid     I discussed the assessment and treatment plan with the patient. The patient was provided an opportunity to ask questions and all were answered. The patient agreed with the plan and demonstrated an understanding of the instructions.   The patient was advised to call back or seek an in-person evaluation if the symptoms worsen or if the condition fails to improve as anticipated.  I provided  25 minutes of non-face-to-face time during this encounter reviewing patient's current problems and post surgeries.  Providing counseling on the above mentioned problems , and coordination  of care .   [name], MD

## 2019-01-08 NOTE — Assessment & Plan Note (Addendum)
Diagnosed in Oct 2014 with a1c of 7.8.  Has been  well-controlled on diet alone  .   Patient is overdue for follow up and reminded to schedule an annual eye exam .  Patient has no history of microalbuminuria. Patient is tolerating statin therapy for CAD risk reduction and on ACE/ARB for renal protection and hypertension.

## 2019-01-09 ENCOUNTER — Telehealth: Payer: Self-pay

## 2019-01-09 ENCOUNTER — Other Ambulatory Visit (HOSPITAL_COMMUNITY)
Admission: AD | Admit: 2019-01-09 | Discharge: 2019-01-09 | Disposition: A | Discharge status: Home / Self Care | Payer: PRIVATE HEALTH INSURANCE | Source: Ambulatory Visit | Attending: Internal Medicine | Admitting: Internal Medicine

## 2019-01-09 ENCOUNTER — Encounter: Payer: Self-pay | Admitting: Internal Medicine

## 2019-01-09 ENCOUNTER — Other Ambulatory Visit: Payer: Self-pay | Admitting: Surgical

## 2019-01-09 DIAGNOSIS — F13939 Sedative, hypnotic or anxiolytic use, unspecified with withdrawal, unspecified: Secondary | ICD-10-CM

## 2019-01-09 DIAGNOSIS — E669 Obesity, unspecified: Secondary | ICD-10-CM | POA: Insufficient documentation

## 2019-01-09 DIAGNOSIS — F13239 Sedative, hypnotic or anxiolytic dependence with withdrawal, unspecified: Secondary | ICD-10-CM

## 2019-01-09 DIAGNOSIS — Z125 Encounter for screening for malignant neoplasm of prostate: Secondary | ICD-10-CM | POA: Diagnosis not present

## 2019-01-09 DIAGNOSIS — E1169 Type 2 diabetes mellitus with other specified complication: Secondary | ICD-10-CM

## 2019-01-09 DIAGNOSIS — E785 Hyperlipidemia, unspecified: Secondary | ICD-10-CM | POA: Insufficient documentation

## 2019-01-09 DIAGNOSIS — R944 Abnormal results of kidney function studies: Secondary | ICD-10-CM | POA: Insufficient documentation

## 2019-01-09 DIAGNOSIS — I10 Essential (primary) hypertension: Secondary | ICD-10-CM | POA: Insufficient documentation

## 2019-01-09 DIAGNOSIS — E034 Atrophy of thyroid (acquired): Secondary | ICD-10-CM

## 2019-01-09 DIAGNOSIS — K860 Alcohol-induced chronic pancreatitis: Secondary | ICD-10-CM | POA: Insufficient documentation

## 2019-01-09 HISTORY — DX: Sedative, hypnotic or anxiolytic dependence with withdrawal, unspecified: F13.239

## 2019-01-09 HISTORY — DX: Sedative, hypnotic or anxiolytic use, unspecified with withdrawal, unspecified: F13.939

## 2019-01-09 HISTORY — DX: Alcohol-induced chronic pancreatitis: K86.0

## 2019-01-09 LAB — COMPREHENSIVE METABOLIC PANEL
ALT: 29 U/L (ref 0–44)
AST: 34 U/L (ref 15–41)
Albumin: 4 g/dL (ref 3.5–5.0)
Alkaline Phosphatase: 111 U/L (ref 38–126)
Anion gap: 10 (ref 5–15)
BUN: 18 mg/dL (ref 6–20)
CO2: 26 mmol/L (ref 22–32)
Calcium: 9 mg/dL (ref 8.9–10.3)
Chloride: 103 mmol/L (ref 98–111)
Creatinine, Ser: 1.25 mg/dL — ABNORMAL HIGH (ref 0.61–1.24)
GFR calc Af Amer: >60 mL/min (ref >60)
GFR calc non Af Amer: >60 mL/min (ref >60)
Glucose, Bld: 118 mg/dL — ABNORMAL HIGH (ref 70–99)
Potassium: 4.6 mmol/L (ref 3.5–5.1)
Sodium: 139 mmol/L (ref 135–145)
Total Bilirubin: 0.5 mg/dL (ref 0.3–1.2)
Total Protein: 6.7 g/dL (ref 6.5–8.1)

## 2019-01-09 LAB — LIPID PANEL
Cholesterol: 132 mg/dL (ref 0–200)
HDL: 33 mg/dL — ABNORMAL LOW (ref >40)
LDL Cholesterol: 44 mg/dL (ref 0–99)
Total CHOL/HDL Ratio: 4 RATIO
Triglycerides: 277 mg/dL — ABNORMAL HIGH (ref <150)
VLDL: 55 mg/dL — ABNORMAL HIGH (ref 0–40)

## 2019-01-09 LAB — PSA: Prostatic Specific Antigen: 0.97 ng/mL (ref 0.00–4.00)

## 2019-01-09 LAB — TSH: TSH: 1.647 u[IU]/mL (ref 0.350–4.500)

## 2019-01-09 MED ORDER — OXYCODONE HCL 5 MG PO TABS: 5.0000 mg | ORAL_TABLET | Freq: Three times a day (TID) | ORAL | 0 refills | Status: DC

## 2019-01-09 NOTE — Telephone Encounter (Signed)
Ok to rf x 1 thx

## 2019-01-09 NOTE — Telephone Encounter (Signed)
Abs have been placed for lab draw.

## 2019-01-09 NOTE — Telephone Encounter (Signed)
Can you please send this in?

## 2019-01-09 NOTE — Assessment & Plan Note (Signed)
Liver enzymes normalized with alcohol abstinence and weight loss .  Continue statin.  Will start metformin if a1c is > 6.5   Lab Results  Component Value Date   ALT 29 01/09/2019   AST 34 01/09/2019   ALKPHOS 111 01/09/2019   BILITOT 0.5 01/09/2019

## 2019-01-09 NOTE — Assessment & Plan Note (Signed)
Likely due to fasting state.  Cr will need to be repeated when not fasting   Lab Results  Component Value Date   CREATININE 1.25 (H) 01/09/2019

## 2019-01-09 NOTE — Telephone Encounter (Signed)
error 

## 2019-01-09 NOTE — Assessment & Plan Note (Signed)
Patient is taking his medications as prescribed and notes no adverse effects.  Home BP readings have been done about once per week and are  generally < 140/80 .  he is avoiding added salt in her diet and not walking regularly   Lab Results  Component Value Date   CREATININE 1.25 (H) 01/09/2019   Lab Results  Component Value Date   NA 139 01/09/2019   K 4.6 01/09/2019   CL 103 01/09/2019   CO2 26 01/09/2019

## 2019-01-09 NOTE — Assessment & Plan Note (Signed)
Managed with rosuvastatin.  Direct LDL is at goal.   Lab Results  Component Value Date   CHOL 132 01/09/2019   HDL 33 (L) 01/09/2019   LDLCALC 44 01/09/2019   LDLDIRECT 101.0 10/16/2017   TRIG 277 (H) 01/09/2019   CHOLHDL 4.0 01/09/2019    

## 2019-01-09 NOTE — Assessment & Plan Note (Signed)
Thyroid function is WNL on current dose.  No current changes needed.   Lab Results  Component Value Date   TSH 1.647 01/09/2019

## 2019-01-09 NOTE — Assessment & Plan Note (Signed)
Aggravated by financial hardship,  Physical disability,  And his wife [name]'s deteriorating medical condition as she waits for a heart transplant.  He is under the care of a local psychiatrist who has recommended an increase in his clonazepam dose,  Which I agree with and will do ,  To 1 mg bid

## 2019-01-09 NOTE — Telephone Encounter (Signed)
Received fax from patient pharmacy [facility] in [place] requesting rxrf for patient for oxy 5mg  please advise. Thanks.

## 2019-01-10 LAB — MICROALBUMIN / CREATININE URINE RATIO
Creatinine, Urine: 172.7 mg/dL
Microalb Creat Ratio: 27 mg/g creat (ref 0–29)
Microalb, Ur: 47.1 ug/mL — ABNORMAL HIGH

## 2019-01-10 LAB — HEMOGLOBIN A1C
Hgb A1c MFr Bld: 5.8 % — ABNORMAL HIGH (ref 4.8–5.6)
Mean Plasma Glucose: 120 mg/dL

## 2019-01-14 ENCOUNTER — Telehealth: Payer: Self-pay | Admitting: *Deleted

## 2019-01-14 ENCOUNTER — Other Ambulatory Visit: Payer: Self-pay | Admitting: Surgical

## 2019-01-14 MED ORDER — OXYCODONE HCL 5 MG PO TABS: 5.0000 mg | ORAL_TABLET | Freq: Three times a day (TID) | ORAL | 0 refills | Status: DC

## 2019-01-14 NOTE — Telephone Encounter (Signed)
Can you advise? 

## 2019-01-14 NOTE — Telephone Encounter (Signed)
Pt called stating he called his pharmacy to see if the oxycodone was ready to pick up and they told him they never received a response back from [place] to rf. Pt states this is the 2nd time he has called to check on it. Pt says he is having severe lbp and knee pain. Would like the oxycodone called in again and to call him to let him know.   CB [phone]

## 2019-01-15 ENCOUNTER — Ambulatory Visit (INDEPENDENT_AMBULATORY_CARE_PROVIDER_SITE_OTHER): Payer: PRIVATE HEALTH INSURANCE | Admitting: Physical Medicine and Rehabilitation

## 2019-01-15 ENCOUNTER — Other Ambulatory Visit: Payer: PRIVATE HEALTH INSURANCE

## 2019-01-15 ENCOUNTER — Encounter: Payer: Self-pay | Admitting: Internal Medicine

## 2019-01-15 ENCOUNTER — Telehealth: Payer: Self-pay | Admitting: Internal Medicine

## 2019-01-15 DIAGNOSIS — R202 Paresthesia of skin: Secondary | ICD-10-CM

## 2019-01-15 NOTE — Progress Notes (Signed)
 .  Numeric Pain Rating Scale and Functional Assessment Average Pain 8   In the last MONTH (on 0-10 scale) has pain interfered with the following?  1. General activity like being  able to carry out your everyday physical activities such as walking, climbing stairs, carrying groceries, or moving a chair?  Rating(8)   

## 2019-01-16 NOTE — Procedures (Signed)
EMG & NCV Findings: Evaluation of the left median motor nerve showed prolonged distal onset latency (5.8 ms) and decreased conduction velocity (Elbow-Wrist, 46 m/s).  The left median (across palm) sensory nerve showed prolonged distal peak latency (Wrist, 6.1 ms) and prolonged distal peak latency (Palm, 2.3 ms).  The left ulnar sensory nerve showed prolonged distal peak latency (3.9 ms), reduced amplitude (13.5 V), and decreased conduction velocity (Wrist-5th Digit, 36 m/s).  All remaining nerves (as indicated in the following tables) were within normal limits.    Needle evaluation of the left extensor digitorum communis muscle showed increased insertional activity.  The left triceps muscle showed diminished recruitment.  All remaining muscles (as indicated in the following table) showed no evidence of electrical instability.    Impression: The above electrodiagnostic study is ABNORMAL and reveals evidence of:  1.  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   2.  A mild chronic C6 radiculopathy on the left.    There is no significant electrodiagnostic evidence of any other focal nerve entrapment, brachial plexopathy or generalized peripheral neuropathy.   Recommendations: 1.  Follow-up with referring physician. 2.  Continue current management of symptoms. 3.  Suggestuse of resting splint at night-time and as needed during the day. 4.  Suggest surgical evaluation.  Could try diagnostic carpal tunnel injection.  ___________________________ Laurence Spates FAAPMR Board Certified, American Board of Physical Medicine and Rehabilitation    Nerve Conduction Studies Anti Sensory Summary Table   Stim Site NR Peak (ms) Norm Peak (ms) P-T Amp (V) Norm P-T Amp Site1 Site2 Delta-P (ms) Dist (cm) Vel (m/s) Norm Vel (m/s)  Left Median Acr Palm Anti Sensory (2nd Digit)  31.6C  Wrist    *6.1 <3.6 13.8 >10  Wrist Palm 3.8 0.0    Palm    *2.3 <2.0 7.9         Left Radial Anti Sensory (Base 1st Digit)  31.1C  Wrist    2.6 <3.1 16.0  Wrist Base 1st Digit 2.6 0.0    Left Ulnar Anti Sensory (5th Digit)  31.4C  Wrist    *3.9 <3.7 *13.5 >15.0 Wrist 5th Digit 3.9 14.0 *36 >38   Motor Summary Table   Stim Site NR Onset (ms) Norm Onset (ms) O-P Amp (mV) Norm O-P Amp Site1 Site2 Delta-0 (ms) Dist (cm) Vel (m/s) Norm Vel (m/s)  Left Median Motor (Abd Poll Brev)  31.1C  Wrist    *5.8 <4.2 5.1 >5 Elbow Wrist 5.5 25.5 *46 >50  Elbow    11.3  4.7         Left Ulnar Motor (Abd Dig Min)  31.1C  Wrist    3.4 <4.2 12.1 >3 B Elbow Wrist 4.3 23.0 53 >53  B Elbow    7.7  11.6  A Elbow B Elbow 1.8 10.5 58 >53  A Elbow    9.5  11.9          EMG   Side Muscle Nerve Root Ins Act Fibs Psw Amp Dur Poly Recrt Int Fraser Din Comment  Left 1stDorInt Ulnar C8-T1 Nml Nml Nml Nml Nml 0 Nml Nml   Left Abd Poll Brev Median C8-T1 Nml Nml Nml Nml Nml 0 Nml Nml   Left ExtDigCom   *Incr Nml Nml Nml Nml 0 Nml Nml   Left Triceps Radial C6-7-8 Nml Nml Nml Nml Nml 0 *Reduced Nml   Left Deltoid Axillary C5-6 Nml Nml Nml  Nml Nml 0 Nml Nml     Nerve Conduction Studies Anti Sensory Left/Right Comparison   Stim Site L Lat (ms) R Lat (ms) L-R Lat (ms) L Amp (V) R Amp (V) L-R Amp (%) Site1 Site2 L Vel (m/s) R Vel (m/s) L-R Vel (m/s)  Median Acr Palm Anti Sensory (2nd Digit)  31.6C  Wrist *6.1   13.8   Wrist Palm     Palm *2.3   7.9         Radial Anti Sensory (Base 1st Digit)  31.1C  Wrist 2.6   16.0   Wrist Base 1st Digit     Ulnar Anti Sensory (5th Digit)  31.4C  Wrist *3.9   *13.5   Wrist 5th Digit *36     Motor Left/Right Comparison   Stim Site L Lat (ms) R Lat (ms) L-R Lat (ms) L Amp (mV) R Amp (mV) L-R Amp (%) Site1 Site2 L Vel (m/s) R Vel (m/s) L-R Vel (m/s)  Median Motor (Abd Poll Brev)  31.1C  Wrist *5.8   5.1   Elbow Wrist *46    Elbow 11.3   4.7         Ulnar Motor (Abd Dig Min)  31.1C  Wrist 3.4   12.1   B  Elbow Wrist 53    B Elbow 7.7   11.6   A Elbow B Elbow 58    A Elbow 9.5   11.9            Waveforms:

## 2019-01-16 NOTE — Progress Notes (Signed)
[name] - 53 y.o. male MRN [id number]  Date of birth: 04-May-1965  Office Visit Note: Visit Date: 01/15/2019 PCP: [name], MD Referred by: [name], MD  Subjective: Chief Complaint  Patient presents with  . Left Arm - Pain, Numbness, Tingling  . Left Hand - Tingling, Pain, Numbness   HPI: [name] is a 53 y.o. male who comes in today At the request of Dr. Rodell Perna for electrodiagnostic study of the left upper limb.  Patient has been followed by Dr. Lorin Mercy an MRI of the cervical spine shows moderate narrowing at C5-6 with some other issues of the cervical spine.  Patient's complaints are still numbness and pain in the left arm and left hand.  He really states it is in all of his fingers and no real specific distribution.  He reports it started over a year ago when he is having trouble holding items and picking things up in the left hand.  He is right-hand dominant.  He has not had prior electrodiagnostic studies.  He is a diabetic and he has had problems with hypothyroid disease.  He rates his average pain as an 8 out of 10.  ROS Otherwise per HPI.  Assessment & Plan: Visit Diagnoses:  1. Paresthesia of skin     Plan: Impression: The above electrodiagnostic study is ABNORMAL and reveals evidence of:  1.  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   2.  A mild chronic C6 radiculopathy on the left.    There is no significant electrodiagnostic evidence of any other focal nerve entrapment, brachial plexopathy or generalized peripheral neuropathy.   Recommendations: 1.  Follow-up with referring physician. 2.  Continue current management of symptoms. 3.  Suggestuse of resting splint at night-time and as needed during the day. 4.  Suggest surgical evaluation.  Could try diagnostic carpal tunnel injection.  Meds & Orders: No orders of the defined types were placed  in this encounter.   Orders Placed This Encounter  Procedures  . NCV with EMG (electromyography)    Follow-up: Return for Rodell Perna, M.D..   Procedures: No procedures performed  EMG & NCV Findings: Evaluation of the left median motor nerve showed prolonged distal onset latency (5.8 ms) and decreased conduction velocity (Elbow-Wrist, 46 m/s).  The left median (across palm) sensory nerve showed prolonged distal peak latency (Wrist, 6.1 ms) and prolonged distal peak latency (Palm, 2.3 ms).  The left ulnar sensory nerve showed prolonged distal peak latency (3.9 ms), reduced amplitude (13.5 V), and decreased conduction velocity (Wrist-5th Digit, 36 m/s).  All remaining nerves (as indicated in the following tables) were within normal limits.    Needle evaluation of the left extensor digitorum communis muscle showed increased insertional activity.  The left triceps muscle showed diminished recruitment.  All remaining muscles (as indicated in the following table) showed no evidence of electrical instability.    Impression: The above electrodiagnostic study is ABNORMAL and reveals evidence of:  1.  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   2.  A mild chronic C6 radiculopathy on the left.    There is no significant electrodiagnostic evidence of any other focal nerve entrapment, brachial plexopathy or generalized peripheral neuropathy.   Recommendations: 1.  Follow-up with referring physician. 2.  Continue current management of symptoms. 3.  Suggestuse of resting  splint at night-time and as needed during the day. 4.  Suggest surgical evaluation.  Could try diagnostic carpal tunnel injection.  ___________________________ Laurence Spates FAAPMR Board Certified, American Board of Physical Medicine and Rehabilitation    Nerve Conduction Studies Anti Sensory Summary Table   Stim Site NR Peak (ms)  Norm Peak (ms) P-T Amp (V) Norm P-T Amp Site1 Site2 Delta-P (ms) Dist (cm) Vel (m/s) Norm Vel (m/s)  Left Median Acr Palm Anti Sensory (2nd Digit)  31.6C  Wrist    *6.1 <3.6 13.8 >10 Wrist Palm 3.8 0.0    Palm    *2.3 <2.0 7.9         Left Radial Anti Sensory (Base 1st Digit)  31.1C  Wrist    2.6 <3.1 16.0  Wrist Base 1st Digit 2.6 0.0    Left Ulnar Anti Sensory (5th Digit)  31.4C  Wrist    *3.9 <3.7 *13.5 >15.0 Wrist 5th Digit 3.9 14.0 *36 >38   Motor Summary Table   Stim Site NR Onset (ms) Norm Onset (ms) O-P Amp (mV) Norm O-P Amp Site1 Site2 Delta-0 (ms) Dist (cm) Vel (m/s) Norm Vel (m/s)  Left Median Motor (Abd Poll Brev)  31.1C  Wrist    *5.8 <4.2 5.1 >5 Elbow Wrist 5.5 25.5 *46 >50  Elbow    11.3  4.7         Left Ulnar Motor (Abd Dig Min)  31.1C  Wrist    3.4 <4.2 12.1 >3 B Elbow Wrist 4.3 23.0 53 >53  B Elbow    7.7  11.6  A Elbow B Elbow 1.8 10.5 58 >53  A Elbow    9.5  11.9          EMG   Side Muscle Nerve Root Ins Act Fibs Psw Amp Dur Poly Recrt Int Fraser Din Comment  Left 1stDorInt Ulnar C8-T1 Nml Nml Nml Nml Nml 0 Nml Nml   Left Abd Poll Brev Median C8-T1 Nml Nml Nml Nml Nml 0 Nml Nml   Left ExtDigCom   *Incr Nml Nml Nml Nml 0 Nml Nml   Left Triceps Radial C6-7-8 Nml Nml Nml Nml Nml 0 *Reduced Nml   Left Deltoid Axillary C5-6 Nml Nml Nml Nml Nml 0 Nml Nml     Nerve Conduction Studies Anti Sensory Left/Right Comparison   Stim Site L Lat (ms) R Lat (ms) L-R Lat (ms) L Amp (V) R Amp (V) L-R Amp (%) Site1 Site2 L Vel (m/s) R Vel (m/s) L-R Vel (m/s)  Median Acr Palm Anti Sensory (2nd Digit)  31.6C  Wrist *6.1   13.8   Wrist Palm     Palm *2.3   7.9         Radial Anti Sensory (Base 1st Digit)  31.1C  Wrist 2.6   16.0   Wrist Base 1st Digit     Ulnar Anti Sensory (5th Digit)  31.4C  Wrist *3.9   *13.5   Wrist 5th Digit *36     Motor Left/Right Comparison   Stim Site L Lat (ms) R Lat (ms) L-R Lat (ms) L Amp (mV) R Amp (mV) L-R Amp (%) Site1 Site2 L Vel (m/s) R  Vel (m/s) L-R Vel (m/s)  Median Motor (Abd Poll Brev)  31.1C  Wrist *5.8   5.1   Elbow Wrist *46    Elbow 11.3   4.7         Ulnar Motor (Abd Dig Min)  31.1C  Wrist 3.4   12.1  B Elbow Wrist 53    B Elbow 7.7   11.6   A Elbow B Elbow 58    A Elbow 9.5   11.9            Waveforms:            Clinical History: MRI CERVICAL SPINE WITHOUT CONTRAST  TECHNIQUE: Multiplanar, multisequence MR imaging of the cervical spine was performed. No intravenous contrast was administered.  COMPARISON:  Cervical spine radiographs 08/16/2018, cervical spine MRI 11/26/2013  FINDINGS: Multiple sequences are motion degraded.  Alignment: Straightening of the expected cervical lordosis. No significant spondylolisthesis  Vertebrae: Congenital segmentation anomaly within the upper cervical spine in the form of incomplete C2-C3 segmentation. By this numbering system, the superior most ribs are designated T1. Vertebral body height is maintained. No suspicious osseous lesions.  Cord: Within the limitations of motion degradation, no spinal cord signal abnormality identified.  Posterior Fossa, vertebral arteries, paraspinal tissues: No abnormality  Disc levels:  Mild multilevel disc degeneration greatest at C5-C6.  C2-C3: Congenital anomaly with incomplete segmentation. No significant spinal canal narrowing.  C3-C4: Disc bulge. Right greater than left disc osteophyte ridge/uncinate hypertrophy. Facet hypertrophy (also greater on the right). No significant spinal canal stenosis. Moderate right with mild left neural foraminal narrowing. Findings are similar to prior MRI.  C4-C5: Disc bulge. Uncinate/facet hypertrophy. Mild spinal canal stenosis. Mild right with moderate/severe left neural foraminal narrowing. Findings are similar to prior MRI.  C5-C6: Interval progression of disc bulge with superimposed small right center disc protrusion. Bilateral disc osteophyte  ridge/uncinate hypertrophy. Facet hypertrophy. Moderate spinal canal stenosis with contact upon and mild flattening of the ventral spinal cord. Moderate/severe left neural foraminal narrowing also appears progressed from prior.  C6-C7: Mild uncinate/facet hypertrophy is new from prior exam. No significant disc herniation, spinal canal or neural foraminal narrowing.  C7-T1: No significant disc herniation.No significant spinal canal or neural foraminal narrowing.  IMPRESSION: Motion degraded exam.  Redemonstrated C2-C3 congenital segmentation anomaly.  C5-C6 spondylosis has progressed since prior MRI 11/26/2013. At this level, a disc bulge with superimposed small right center disc protrusion contribute to moderate spinal canal stenosis with mild flattening of the ventral spinal cord. Moderate/severe left neural foraminal narrowing at this level, also progressed.  No more than mild spinal canal stenosis at the remaining levels. Additional sites of neural foraminal narrowing as described, and greatest on the right at C3-C4 (moderate) and on the left at C4-C5 (moderate/severe).   Electronically Signed   By: Kellie Simmering   On: 12/06/2018 09:00   He reports that he quit smoking about 13 years ago. His smoking use included cigarettes. He has a 36.00 pack-year smoking history. He has never used smokeless tobacco.  Recent Labs    01/09/19 1150  HGBA1C 5.8*    Objective:  VS:  HT:    WT:   BMI:     BP:   HR: bpm  TEMP: ( )  RESP:  Physical Exam Musculoskeletal:        General: No tenderness.     Comments: Inspection reveals no atrophy of the bilateral APB or FDI or hand intrinsics. There is no swelling, color changes, allodynia or dystrophic changes.  Patient does not want to give full effort do to pain but there is 5 out of 5 strength in the bilateral wrist extension, finger abduction and long finger flexion. There is intact sensation to light touch in all dermatomal  and peripheral nerve distributions. There is a negative  Hoffmann's test bilaterally.  Skin:    General: Skin is warm and dry.     Findings: No erythema or rash.  Neurological:     General: No focal deficit present.     Mental Status: He is alert and oriented to person, place, and time.     Sensory: No sensory deficit.     Motor: No weakness or abnormal muscle tone.     Coordination: Coordination normal.     Gait: Gait normal.  Psychiatric:        Mood and Affect: Mood normal.        Behavior: Behavior normal.        Thought Content: Thought content normal.     Ortho Exam Imaging: No results found.  Past Medical/Family/Surgical/Social History: Medications & Allergies reviewed per EMR, new medications updated. Patient Active Problem List   Diagnosis Date Noted  . Decreased GFR 01/09/2019  . Benzodiazepine withdrawal with complication ([place]) 123XX123  . 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 Spinal stenosis of cervical region 12/31/2018  . Loose body of left knee   . Acute medial meniscus tear of left knee   . Angioedema of lips 11/26/2016  . Cognitive complaints with normal neuropsychological exam 10/23/2016  . 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 Hypothyroidism due to acquired atrophy of thyroid 09/14/2015  . Arthritis of knee 06/23/2014  . Encounter for preventive health examination 06/17/2014  . Enlarged RV (right ventricle) 06/17/2014  . Cervical spine degeneration 02/17/2014  . Overweight 10/29/2013  . Chronic knee pain 10/29/2013  . Diabetes mellitus type 2 in obese ([place]) 10/23/2012  . Seborrheic dermatitis, unspecified 05/17/2012  . Fatty liver 04/22/2012  . Sciatica of right side 12/24/2010  . Hyperlipidemia associated with type 2 diabetes mellitus ([place]) 01/13/2009  . Anxiety state 01/13/2009  . Essential hypertension 01/13/2009  . OSA  (obstructive sleep apnea) 01/13/2009   Past Medical History:  Diagnosis Date  . Alcohol-induced chronic pancreatitis ([place]) 01/09/2019  . Anxiety   . Anxiety and depression   . Arthritis    knees and shoulders  . Bruit    L  . Chest pain    hx  . Diabetes mellitus without complication ([place])    "borderline", diet controlled, no meds, patient has lost 30 lbs  . Edema   . Fatty liver   . GERD (gastroesophageal reflux disease)    uses Omeprazole  . Goiter   . HLD (hyperlipidemia)   . Hypertension    essential, benign  . Hypothyroidism   . Impotence of organic origin   . Murmur    never has caused any problems  . Neuromuscular disorder ([place])   . Other chest pain    tightness, pressure  . Palpitation    hx  . Precordial pain   . Sleep apnea    uses cpap   Family History  Problem Relation Age of Onset  . Heart disease Father    Past Surgical History:  Procedure Laterality Date  . APPENDECTOMY  2002   done at [facility]  . CARDIAC CATHETERIZATION    . CARDIAC CATHETERIZATION N/A 08/27/2015   Procedure: Left Heart Cath and Coronary Angiography;  Surgeon: Jolaine Artist, MD;  Location: [place] [facility];  Service: Cardiovascular;  Laterality: N/A;  . COLONOSCOPY    . HERNIA REPAIR  [id number]   umbilical  . JOINT REPLACEMENT     right  knee  . KNEE ARTHROSCOPY Left 12/17/2018   Procedure: left knee arthroscopy, meniscal debridement, loose body removal;  Surgeon: Meredith Pel, MD;  Location: [place];  Service: Orthopedics;  Laterality: Left;  . REVISION TOTAL KNEE ARTHROPLASTY Right 06/23/2014   DR Marlou Sa  . TONSILLECTOMY    . TOTAL KNEE ARTHROPLASTY Right 2011   right  . TOTAL KNEE REVISION Right 06/23/2014   Procedure: TOTAL KNEE REVISION;  Surgeon: Meredith Pel, MD;  Location: [place];  Service: Orthopedics;  Laterality: Right;  . WISDOM TOOTH EXTRACTION     Social History   Occupational History  . Occupation: Health and safety inspector  . Occupation: SERVICE DIRECTOR    Employer: [employer]  Tobacco Use  . Smoking status: Former Smoker    Packs/day: 2.00    Years: 18.00    Pack years: 36.00    Types: Cigarettes    Quit date: 04/24/2005    Years since quitting: 13.7  . Smokeless tobacco: Never Used  . Tobacco comment: quit 20 years ago   Substance and Sexual Activity  . Alcohol use: Yes    Alcohol/week: 8.0 - 9.0 standard drinks    Types: 6 Cans of beer, 2 - 3 Shots of liquor per week    Comment: beer/liquor  . Drug use: No  . Sexual activity: Yes

## 2019-01-22 ENCOUNTER — Encounter: Payer: Self-pay | Admitting: Orthopedic Surgery

## 2019-01-22 ENCOUNTER — Ambulatory Visit (INDEPENDENT_AMBULATORY_CARE_PROVIDER_SITE_OTHER): Payer: PRIVATE HEALTH INSURANCE | Admitting: Orthopedic Surgery

## 2019-01-22 ENCOUNTER — Other Ambulatory Visit: Payer: Self-pay

## 2019-01-22 VITALS — Ht 70.0 in | Wt 215.0 lb

## 2019-01-22 DIAGNOSIS — M25562 Pain in left knee: Secondary | ICD-10-CM

## 2019-01-22 NOTE — Progress Notes (Addendum)
Post-Op Visit Note   Patient: [name]           Date of Birth: 1966-03-20           MRN: [name] Visit Date: 01/22/2019 PCP: Joshua Mc, MD   Assessment & Plan:  Chief Complaint:  Chief Complaint  Patient presents with  . Left Knee - Routine Post Op    12/17/2018 left knee arthro   Visit Diagnoses:  1. Left knee pain, unspecified chronicity     Plan: [name] is now about 5 weeks out left knee arthroscopy partial medial meniscectomy.  Also had partial ACL tear.  He has been having some popping in that left knee.  On exam he has good range of motion mild effusion stable collateral cruciate ligaments.  Good endpoint after about 2 to 2-1/2 mm anterior translation.  Plan at this time is to really continue to work on leg strengthening exercises.  Follow-up with me as needed.  Knee is aspirated today to see if that can help with any of his symptoms.  Toradol and Marcaine also injected.  The popping however is to be expected.  Follow-Up Instructions: Return if symptoms worsen or fail to improve.   Orders:  No orders of the defined types were placed in this encounter.  No orders of the defined types were placed in this encounter.   Imaging: No results found.  PMFS History: Patient Active Problem List   Diagnosis Date Noted  . Decreased GFR 01/09/2019  . Benzodiazepine withdrawal with complication ([place]) 123XX123  . 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 Spinal stenosis of cervical region 12/31/2018  . Loose body of left knee   . Acute medial meniscus tear of left knee   . Angioedema of lips 11/26/2016  . Cognitive complaints with normal neuropsychological exam 10/23/2016  . 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 Hypothyroidism due to acquired atrophy of thyroid 09/14/2015  . Arthritis of knee 06/23/2014  . Encounter for preventive health  examination 06/17/2014  . Enlarged RV (right ventricle) 06/17/2014  . Cervical spine degeneration 02/17/2014  . Overweight 10/29/2013  . Chronic knee pain 10/29/2013  . Diabetes mellitus type 2 in obese ([place]) 10/23/2012  . Seborrheic dermatitis, unspecified 05/17/2012  . Fatty liver 04/22/2012  . Sciatica of right side 12/24/2010  . Hyperlipidemia associated with type 2 diabetes mellitus ([place]) 01/13/2009  . Anxiety state 01/13/2009  . Essential hypertension 01/13/2009  . OSA (obstructive sleep apnea) 01/13/2009   Past Medical History:  Diagnosis Date  . 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 Anxiety   . Anxiety and depression   . Arthritis    knees and shoulders  . Bruit    L  . Chest pain    hx  . Diabetes mellitus without complication ([place])    "borderline", diet controlled, no meds, patient has lost 30 lbs  . Edema   . Fatty liver   . GERD (gastroesophageal reflux disease)    uses Omeprazole  . Goiter   . HLD (hyperlipidemia)   . Hypertension    essential, benign  . Hypothyroidism   . Impotence of organic origin   . Murmur    never has caused any problems  . Neuromuscular disorder ([place])   . Other chest pain    tightness, pressure  . Palpitation    hx  . Precordial pain   . Sleep apnea  uses cpap    Family History  Problem Relation Age of Onset  . Heart disease Father     Past Surgical History:  Procedure Laterality Date  . APPENDECTOMY  2002   done at [facility]  . CARDIAC CATHETERIZATION    . CARDIAC CATHETERIZATION N/A 08/27/2015   Procedure: Left Heart Cath and Coronary Angiography;  Surgeon: Jolaine Artist, MD;  Location: [place] [facility];  Service: Cardiovascular;  Laterality: N/A;  . COLONOSCOPY    . HERNIA REPAIR  [id number]   umbilical  . JOINT REPLACEMENT     right knee  . KNEE ARTHROSCOPY Left 12/17/2018   Procedure: left knee arthroscopy, meniscal debridement, loose body removal;  Surgeon: Meredith Pel, MD;  Location:  [place];  Service: Orthopedics;  Laterality: Left;  . REVISION TOTAL KNEE ARTHROPLASTY Right 06/23/2014   DR Marlou Sa  . TONSILLECTOMY    . TOTAL KNEE ARTHROPLASTY Right 2011   right  . TOTAL KNEE REVISION Right 06/23/2014   Procedure: TOTAL KNEE REVISION;  Surgeon: Meredith Pel, MD;  Location: [place];  Service: Orthopedics;  Laterality: Right;  . WISDOM TOOTH EXTRACTION     Social History   Occupational History  . Occupation: IT trainer  . Occupation: SERVICE DIRECTOR    Employer: [employer]  Tobacco Use  . Smoking status: Former Smoker    Packs/day: 2.00    Years: 18.00    Pack years: 36.00    Types: Cigarettes    Quit date: 04/24/2005    Years since quitting: 13.7  . Smokeless tobacco: Never Used  . Tobacco comment: quit 20 years ago   Substance and Sexual Activity  . Alcohol use: Yes    Alcohol/week: 8.0 - 9.0 standard drinks    Types: 6 Cans of beer, 2 - 3 Shots of liquor per week    Comment: beer/liquor  . Drug use: No  . Sexual activity: Yes

## 2019-01-31 ENCOUNTER — Telehealth: Payer: Self-pay | Admitting: Podiatry

## 2019-01-31 NOTE — Telephone Encounter (Signed)
DOS: 02/20/2019 SURGICAL PROCEDURES: Vilinda Blanks Implant Lt, Metatarsal Osteotomy 5th Lt CPT CODES: 90300, [phone] DX CODES: M20.5X2, M20.12  [facility]  Effective Date: 24 April 2018  There is no deductible.  Out of Pocket Max for Individual is $1,400 which has been met. Out of Pocket Max for Family is $2,800.  CoInsurance/Benefits are 75%/25%  No prior authorization is required.  Ref# [id number] per Algernon Huxley.

## 2019-02-01 ENCOUNTER — Other Ambulatory Visit: Payer: Self-pay | Admitting: Internal Medicine

## 2019-02-03 ENCOUNTER — Telehealth (HOSPITAL_COMMUNITY): Payer: Self-pay | Admitting: *Deleted

## 2019-02-03 NOTE — Telephone Encounter (Signed)
Pt left VM requesting asap appt with Dr.Bensimhon because of recent ED visit. Pt has not been seen by Dr.Bensimhon since 2016. ED note printed for Dr.Bensimhons RN to review.

## 2019-02-04 ENCOUNTER — Other Ambulatory Visit: Payer: Self-pay

## 2019-02-04 ENCOUNTER — Encounter (HOSPITAL_COMMUNITY): Payer: Self-pay

## 2019-02-04 ENCOUNTER — Ambulatory Visit (HOSPITAL_COMMUNITY)
Admission: RE | Admit: 2019-02-04 | Discharge: 2019-02-04 | Disposition: A | Discharge status: Home / Self Care | Payer: PRIVATE HEALTH INSURANCE | Source: Ambulatory Visit | Attending: Internal Medicine | Admitting: Internal Medicine

## 2019-02-04 DIAGNOSIS — R072 Precordial pain: Secondary | ICD-10-CM

## 2019-02-04 DIAGNOSIS — I2510 Atherosclerotic heart disease of native coronary artery without angina pectoris: Secondary | ICD-10-CM

## 2019-02-04 DIAGNOSIS — R079 Chest pain, unspecified: Secondary | ICD-10-CM

## 2019-02-04 NOTE — Telephone Encounter (Signed)
Patient came by office during wife's appointment to check the status of appt. Reports he was seen at [facility] ED for chest pain/pressure, was ordered a stress test however patient left AMA. Would like to have stress test done outpatient (NM/Lexiscan requested with torn ACL)  Advised will forward again to Dr Haroldine Laws and once he gives the ok to place order our office would be in contact.

## 2019-02-04 NOTE — Telephone Encounter (Signed)
Per Dr Haroldine Laws pt added onto his sch for virtual visit today

## 2019-02-04 NOTE — Progress Notes (Signed)
Visit summary sent to patient via mychart message. Coronary ct ordered.

## 2019-02-04 NOTE — Patient Instructions (Addendum)
Your physician ordered for you to have a Coronary CT.  This test is a special type of CT scan that uses a computer to produce multi-dimensional views of major blood vessels throughout the body. In CT angiography, a contrast material is injected through an IV to help visualize the blood vessels.  You will get a call to schedule this appointment.   Your physician will review the results of your scan and his office will contact you to discuss.  At the [facility], you and your health needs are our priority. As part of our continuing mission to provide you with exceptional heart care, we have created designated Provider Care Teams. These Care Teams include your primary Cardiologist (physician) and Advanced Practice Providers (APPs- Physician Assistants and Nurse Practitioners) who all work together to provide you with the care you need, when you need it.   You may see any of the following providers on your designated Care Team at your next follow up: Marland Kitchen Dr Glori Bickers . Dr Loralie Champagne . Darrick Grinder, NP . Lyda Jester, PA   Please be sure to bring in all your medications bottles to every appointment.

## 2019-02-04 NOTE — Addendum Note (Signed)
Encounter addended by: Valeda Malm, RN on: 02/04/2019 3:42 PM  Actions taken: Visit diagnoses modified, Order list changed, Diagnosis association updated, Clinical Note Signed

## 2019-02-04 NOTE — Progress Notes (Signed)
Heart Failure TeleHealth Note  Due to national recommendations of social distancing due to [place] 19, Audio/video telehealth visit is felt to be most appropriate for this patient at this time.  See MyChart message from today for patient consent regarding telehealth for [facility].  Date:  02/04/2019   ID:  [name], DOB 1965/05/31, MRN [name]  Location: Home  Provider location: [facility] Type of Visit: Established patient  PCP:  [name], MD  Cardiologist:  No primary care provider on file. Primary HF:   Chief Complaint: Heart Failure follow-up   History of Present Illness:  [name] is a 53 y/o male with HTN, HL, borderline DM, OSA, depression/anxiety and ETOH abuse who presents today for CP.   Had cath on 5/17 with normal cors except for LCX 30%  He presents via audio/video conferencing for a telehealth visit today.     On Friday night had some tingling in his lip which lasted 20-30 mins while eating. About 2-3 hours later got CP and SOB and nauseated and started throwing up. Got fatigued and weak. Called 911. On EMS arrival BP 212/110. Went to [facility]. ECG with RBBB (old). Troponin ok. D-dimer elevated. CT scan of chest without PE. Told him he needed to say the night for a stress test. But he left AMA to be with his wife who has an LVAD.   No CP at all since. Says he can't walk on TM due to torn ACL.     [name] denies symptoms worrisome for COVID 19.   Past Medical History:  Diagnosis Date  . Alcohol-induced chronic pancreatitis ([place]) 01/09/2019  . Anxiety   . Anxiety and depression   . Arthritis    knees and shoulders  . Bruit    L  . Chest pain    hx  . Diabetes mellitus without complication ([name])    "borderline", diet controlled, no meds, patient has lost 30 lbs  . Edema   . Fatty liver   . GERD (gastroesophageal reflux disease)    uses Omeprazole  . Goiter   . HLD (hyperlipidemia)   .  Hypertension    essential, benign  . Hypothyroidism   . Impotence of organic origin   . Murmur    never has caused any problems  . Neuromuscular disorder ([name])   . Other chest pain    tightness, pressure  . Palpitation    hx  . Precordial pain   . Sleep apnea    uses cpap   Past Surgical History:  Procedure Laterality Date  . APPENDECTOMY  2002   done at [facility]  . CARDIAC CATHETERIZATION    . CARDIAC CATHETERIZATION N/A 08/27/2015   Procedure: Left Heart Cath and Coronary Angiography;  Surgeon: Jolaine Artist, MD;  Location: [facility];  Service: Cardiovascular;  Laterality: N/A;  . COLONOSCOPY    . HERNIA REPAIR  [id number]   umbilical  . JOINT REPLACEMENT     right knee  . KNEE ARTHROSCOPY Left 12/17/2018   Procedure: left knee arthroscopy, meniscal debridement, loose body removal;  Surgeon: Meredith Pel, MD;  Location: [facility];  Service: Orthopedics;  Laterality: Left;  . REVISION TOTAL KNEE ARTHROPLASTY Right 06/23/2014   DR Marlou Sa  . TONSILLECTOMY    . TOTAL KNEE ARTHROPLASTY Right 2011   right  . TOTAL KNEE REVISION Right 06/23/2014   Procedure: TOTAL KNEE REVISION;  Surgeon: Meredith Pel, MD;  Location: [facility];  Service: Orthopedics;  Laterality: Right;  . WISDOM TOOTH EXTRACTION       Current Outpatient Medications  Medication Sig Dispense Refill  . aspirin EC 81 MG tablet Take 81 mg by mouth daily.    Marland Kitchen BAYER MICROLET LANCETS lancets Use as instructed 100 each 12  . busPIRone (BUSPAR) 10 MG tablet Take 1 tablet (10 mg total) by mouth 3 (three) times daily. 270 tablet 1  . Clobetasol Propionate 0.05 % shampoo Apply to dry scalp and leave on for 15 minutes (Patient taking differently: Apply 1 application topically See admin instructions. Apply to dry scalp and leave on for 15 minutes as needed for itching.) 118 mL 5  . clonazePAM (KLONOPIN) 1 MG tablet Take 1 tablet (1 mg total) by mouth 2 (two) times daily as needed. for anxiety 60 tablet 0  .  dicyclomine (BENTYL) 20 MG tablet TAKE 1 TABLET (20 MG TOTAL) BY MOUTH EVERY 6 (SIX) HOURS. (Patient taking differently: Take 20 mg by mouth 4 (four) times daily -  before meals and at bedtime. ) 120 tablet 5  . DULoxetine (CYMBALTA) 60 MG capsule TAKE 1 CAPSULE BY MOUTH ONCE DAILY WITH BREAKFAST 90 capsule 0  . gabapentin (NEURONTIN) 600 MG tablet Take 1 tablet (600 mg total) by mouth 2 (two) times daily. 270 tablet 1  . ibuprofen (ADVIL,MOTRIN) 200 MG tablet Take 400 mg by mouth every 6 (six) hours as needed (pain).    Marland Kitchen lamoTRIgine (LAMICTAL) 200 MG tablet Take one tab daily (Patient taking differently: Take 200 mg by mouth daily. ) 30 tablet 2  . levothyroxine (SYNTHROID) 88 MCG tablet Take 1 tablet (88 mcg total) by mouth daily before breakfast. 90 tablet 0  . losartan-hydrochlorothiazide (HYZAAR) 100-25 MG tablet Take 1 tablet by mouth daily. 90 tablet 3  . meloxicam (MOBIC) 15 MG tablet Take 1 tablet by mouth once daily for 30 days 60 tablet 0  . methocarbamol (ROBAXIN) 500 MG tablet Take 1 tablet (500 mg total) by mouth every 8 (eight) hours as needed. 35 tablet 0  . metoprolol tartrate (LOPRESSOR) 25 MG tablet Take 1 tablet (25 mg total) by mouth 2 (two) times daily. 60 tablet 5  . Multiple Vitamin (MULTIVITAMIN WITH MINERALS) TABS tablet Take 1 tablet by mouth daily.    Marland Kitchen omeprazole (PRILOSEC) 40 MG capsule Take 1 capsule by mouth twice daily 180 capsule 0  . oxyCODONE (ROXICODONE) 5 MG immediate release tablet Take 1 tablet (5 mg total) by mouth every 8 (eight) hours. 30 tablet 0  . promethazine (PHENERGAN) 12.5 MG tablet Take 1 tablet (12.5 mg total) by mouth every 8 (eight) hours as needed for nausea or vomiting. 20 tablet 0  . rosuvastatin (CRESTOR) 20 MG tablet Take 1 tablet (20 mg total) by mouth daily. 90 tablet 1  . traZODone (DESYREL) 50 MG tablet TAKE 1-2 TABLETS BY MOUTH NIGHTLY AS NEEDED FOR SLEEP (Patient taking differently: Take 50-100 mg by mouth at bedtime as needed for  sleep. ) 180 tablet 1   No current facility-administered medications for this encounter.     Allergies:   Ambien [zolpidem]   Social History:  The patient  reports that he quit smoking about 13 years ago. His smoking use included cigarettes. He has a 36.00 pack-year smoking history. He has never used smokeless tobacco. He reports current alcohol use of about 8.0 - 9.0 standard drinks of alcohol per week. He reports that he does not use drugs.  Family History:  The patient's family history includes Heart disease in his father.   ROS:  Please see the history of present illness.   All other systems are personally reviewed and negative.   Exam:  (Video/Tele Health Call; Exam is subjective and or/visual.) General:  Speaks in full sentences. No resp difficulty. Lungs: Normal respiratory effort with conversation.  Abdomen: Non-distended per patient report Extremities: Pt denies edema. Neuro: Alert & oriented x 3.   Recent Labs: 12/17/2018: Hemoglobin 13.9; Platelets 236 01/09/2019: ALT 29; BUN 18; Creatinine, Ser 1.25; Potassium 4.6; Sodium 139; TSH 1.647  Personally reviewed   Wt Readings from Last 3 Encounters:  01/22/19 97.5 kg (215 lb)  01/08/19 97.5 kg (215 lb)  12/31/18 97.5 kg (215 lb)      ASSESSMENT AND PLAN:  1.  Chest pain with multiple cardiac risk factors - cath 10/17 with minimal CAD. However given multiple CRFs and severity of symptoms will need further evaluation.  - plan CTA of coronaries to look for progression of CAD.  - Continue ASA/statin and b-blocker - Call 911 if symptoms recur.  COVID screen The patient does not have any symptoms that suggest any further testing/ screening at this time.  Social distancing reinforced today.  Recommended follow-up:  As above  Relevant cardiac medications were reviewed at length with the patient today.   The patient does not have concerns regarding their medications at this time.   The following changes were made today:   As above  Today, I have spent 16 minutes with the patient with telehealth technology discussing the above issues .    Signed, Glori Bickers, MD  02/04/2019 2:40 PM  [facility] [place] [address] Heart and [place] [address] [phone] (office) [phone] (fax)

## 2019-02-11 ENCOUNTER — Telehealth: Payer: Self-pay | Admitting: Orthopedic Surgery

## 2019-02-11 NOTE — Telephone Encounter (Signed)
Patient called. Stated he needed records within I year treatment. I advised him I mailed him a copy on 9/1. He stated he did not receive them and needed right away. I told him I would have him another copy ready to pick up tomorrow

## 2019-02-14 ENCOUNTER — Telehealth (HOSPITAL_COMMUNITY): Payer: Self-pay | Admitting: Emergency Medicine

## 2019-02-14 NOTE — Telephone Encounter (Signed)
Reaching out to patient to offer assistance regarding upcoming cardiac imaging study; pt verbalizes understanding of appt date/time, parking situation and where to check in, pre-test NPO status and medications ordered, and verified current allergies; name and call back number provided for further questions should they arise Marchia Bond RN Navigator Cardiac Imaging [facility] [phone] office [phone] cell  Patients last HR during office visit 76bpm, asked patient to take PO metoprolol 2 hr prior to scan; pt verbalized understanding

## 2019-02-17 ENCOUNTER — Ambulatory Visit (HOSPITAL_COMMUNITY)
Admission: RE | Admit: 2019-02-17 | Discharge: 2019-02-17 | Disposition: A | Discharge status: Home / Self Care | Payer: Self-pay | Source: Ambulatory Visit | Attending: Internal Medicine | Admitting: Internal Medicine

## 2019-02-17 ENCOUNTER — Ambulatory Visit (HOSPITAL_COMMUNITY): Payer: PRIVATE HEALTH INSURANCE

## 2019-02-17 ENCOUNTER — Other Ambulatory Visit: Payer: Self-pay

## 2019-02-17 DIAGNOSIS — R079 Chest pain, unspecified: Secondary | ICD-10-CM | POA: Insufficient documentation

## 2019-02-17 DIAGNOSIS — I2510 Atherosclerotic heart disease of native coronary artery without angina pectoris: Secondary | ICD-10-CM | POA: Diagnosis not present

## 2019-02-17 MED ORDER — NITROGLYCERIN 0.4 MG SL SUBL
0.8000 mg | SUBLINGUAL_TABLET | SUBLINGUAL | Status: DC | PRN
  Administered 2019-02-17: 0.8 mg via SUBLINGUAL

## 2019-02-17 MED ORDER — IOHEXOL 350 MG/ML SOLN
100.0000 mL | Freq: Once | INTRAVENOUS | Status: AC | PRN
  Administered 2019-02-17: 100 mL via INTRAVENOUS

## 2019-02-17 MED ORDER — NITROGLYCERIN 0.4 MG SL SUBL
SUBLINGUAL_TABLET | SUBLINGUAL | Status: AC
  Filled 2019-02-17: qty 2

## 2019-02-19 ENCOUNTER — Telehealth: Payer: Self-pay | Admitting: *Deleted

## 2019-02-19 NOTE — Telephone Encounter (Signed)
I am returning your call.  "I just wanted to know if I need to skip taking any of my meds for my surgery that's scheduled for tomorrow."  Dr. Amalia Hailey said you don't need to skip any medications.  He said if you take any in the morning on take it with a sip of water.  "Alright, that's all I needed to know."

## 2019-02-19 NOTE — Telephone Encounter (Signed)
"  I have a surgery scheduled with Dr. Amalia Hailey tomorrow.  I need you to call me please as soon as possible.  I need to know if there's any medications thats on my list that I do not take tomorrow morning.  Give me a call."

## 2019-02-20 ENCOUNTER — Other Ambulatory Visit: Payer: Self-pay | Admitting: Podiatry

## 2019-02-20 DIAGNOSIS — M2012 Hallux valgus (acquired), left foot: Secondary | ICD-10-CM | POA: Diagnosis not present

## 2019-02-20 DIAGNOSIS — M21542 Acquired clubfoot, left foot: Secondary | ICD-10-CM | POA: Diagnosis not present

## 2019-02-20 MED ORDER — OXYCODONE-ACETAMINOPHEN 5-325 MG PO TABS: 1.0000 | ORAL_TABLET | Freq: Four times a day (QID) | ORAL | 0 refills | Status: DC | PRN

## 2019-02-20 MED ORDER — CEPHALEXIN 500 MG PO CAPS: 500.0000 mg | ORAL_CAPSULE | Freq: Three times a day (TID) | ORAL | 0 refills | Status: DC

## 2019-02-20 NOTE — Progress Notes (Signed)
.  postop

## 2019-02-22 ENCOUNTER — Telehealth: Payer: Self-pay | Admitting: Podiatry

## 2019-02-22 NOTE — Telephone Encounter (Signed)
This patient called to office stating that he is concerned with the blood on his bandage.  He had an implant big toe joint and metatarsal osteotomy performed by Dr.  Amalia Hailey.    He removed his from his cam walker and described having blood on his bandage on both the top and bottom of his bandage over the surgical sites.  I told him this is common even though not expected.  I told him to check the bandage and he says the blood is moist.  I told him to ice his ankle and elevate his foot.  If the blood continues to spread on the bandage call back.   He was told when  the blood dries  the bandages helps stabilize the surgical site.   Gardiner Barefoot DPM

## 2019-02-26 ENCOUNTER — Ambulatory Visit (INDEPENDENT_AMBULATORY_CARE_PROVIDER_SITE_OTHER): Payer: Self-pay | Admitting: Podiatry

## 2019-02-26 ENCOUNTER — Ambulatory Visit (INDEPENDENT_AMBULATORY_CARE_PROVIDER_SITE_OTHER): Payer: PRIVATE HEALTH INSURANCE

## 2019-02-26 ENCOUNTER — Other Ambulatory Visit: Payer: Self-pay

## 2019-02-26 DIAGNOSIS — Z9889 Other specified postprocedural states: Secondary | ICD-10-CM

## 2019-02-26 DIAGNOSIS — M205X2 Other deformities of toe(s) (acquired), left foot: Secondary | ICD-10-CM | POA: Diagnosis not present

## 2019-02-28 ENCOUNTER — Encounter (HOSPITAL_COMMUNITY): Payer: Self-pay | Admitting: Psychiatry

## 2019-02-28 ENCOUNTER — Other Ambulatory Visit: Payer: Self-pay

## 2019-02-28 ENCOUNTER — Ambulatory Visit (INDEPENDENT_AMBULATORY_CARE_PROVIDER_SITE_OTHER): Payer: PRIVATE HEALTH INSURANCE | Admitting: Psychiatry

## 2019-02-28 DIAGNOSIS — F411 Generalized anxiety disorder: Secondary | ICD-10-CM

## 2019-02-28 DIAGNOSIS — F331 Major depressive disorder, recurrent, moderate: Secondary | ICD-10-CM

## 2019-02-28 MED ORDER — ARIPIPRAZOLE 5 MG PO TABS: ORAL_TABLET | ORAL | 1 refills | Status: DC

## 2019-02-28 NOTE — Progress Notes (Signed)
Virtual Visit via Telephone Note  I connected with [name] on 02/28/19 at  9:00 AM EST by telephone and verified that I am speaking with the correct person using two identifiers.   I discussed the limitations, risks, security and privacy concerns of performing an evaluation and management service by telephone and the availability of in person appointments. I also discussed with the patient that there may be a patient responsible charge related to this service. The patient expressed understanding and agreed to proceed.   History of Present Illness: Patient was evaluated by phone session.  We have increase Lamictal 200 mg on his last visit but it did not help.  He is still struggling with anxiety, depression, lack of sleep and fatigue.  He feels sometimes hopeless and helpless but denies any suicidal thoughts.  Finally he had a first hearing for his disability but so far no decision was made.  He is taking multiple psychotropic medication prescribed by PCP.  He had applied for disability.  He is taking care of his wife who has a lot of health issues.  Patient admitted sometimes crying spells but lack of energy, fatigue and motivation to do things.  We have recommended to see therapist but due to taking care of his wife he has not enough time for therapy.  He is taking BuSpar, Cymbalta, gabapentin, Klonopin and trazodone.  Recently his PCP increased Klonopin to 1 mg.  Patient denies drinking or using any illegal substances.  He denies any mania, psychosis or any hallucination.  Past Psychiatric History:Reviewed. H/Odepression and anxiety. No h/osuicidal attempt or inpatient treatment. Seen Dr. Jake Michaelis and given Zoloft, Klonopin and Cymbalta. Later PCP added BuSpar and trazodone. We tried Lamictal but did not help.    Psychiatric Specialty Exam: Physical Exam  ROS  There were no vitals taken for this visit.There is no height or weight on file to calculate BMI.  General Appearance: NA   Eye Contact:  NA  Speech:  Clear and Coherent and Slow  Volume:  Normal  Mood:  Anxious and Dysphoric  Affect:  NA  Thought Process:  Goal Directed  Orientation:  Full (Time, Place, and Person)  Thought Content:  Rumination  Suicidal Thoughts:  No  Homicidal Thoughts:  No  Memory:  Immediate;   Good Recent;   Good Remote;   Good  Judgement:  Good  Insight:  Good  Psychomotor Activity:  NA  Concentration:  Concentration: Fair and Attention Span: Fair  Recall:  Good  Fund of Knowledge:  Good  Language:  Good  Akathisia:  No  Handed:  Right  AIMS (if indicated):     Assets:  Communication Skills Desire for Improvement Housing Resilience  ADL's:  Intact  Cognition:  WNL  Sleep:   3 hrs       Assessment and Plan: Major depressive disorder, recurrent.  Generalized anxiety disorder.  I reviewed his current medication.  He is taking multiple psychotropic medication.  I recommend to discontinue Lamictal since it is not helping his mood.  Recommend to try low-dose Abilify to help his depression.  I explained if Abilify works then we may need to cut down his BuSpar.  He does not want to change his gabapentin since it is helping his neuropathy.  For now he will continue Cymbalta, trazodone, BuSpar, Klonopin from his PCP.  I will forward my note to his PCP as patient is taking multiple psychotropic medication.  Discussed medication side effects and benefits specially Abilify can  cause EPS and metabolic syndrome.  1 more time encouraged to see a therapist for coping skills.  Recommended to call us back if is any question of any concern.  Follow-up in 4 weeks.  Follow Up Instructions:    I discussed the assessment and treatment plan with the patient. The patient was provided an opportunity to ask questions and all were answered. The patient agreed with the plan and demonstrated an understanding of the instructions.   The patient was advised to call back or seek an in-person evaluation  if the symptoms worsen or if the condition fails to improve as anticipated.  I provided 20 minutes of non-face-to-face time during this encounter.   Kathlee Nations, MD

## 2019-03-03 ENCOUNTER — Other Ambulatory Visit: Payer: Self-pay | Admitting: Podiatry

## 2019-03-03 ENCOUNTER — Telehealth: Payer: Self-pay | Admitting: Podiatry

## 2019-03-03 MED ORDER — OXYCODONE-ACETAMINOPHEN 5-325 MG PO TABS: 1.0000 | ORAL_TABLET | Freq: Four times a day (QID) | ORAL | 0 refills | Status: DC | PRN

## 2019-03-03 NOTE — Progress Notes (Signed)
   Subjective:  Patient presents today status post 1st MPJ implant and Tailor's bunionectomy left. DOS: 02/17/2019. He reports some drainage from the incision site. He notes associated redness and swelling. He has been taking Keflex and using the CAM boot as directed. He denies any worsening factors at this time. Patient is here for further evaluation and treatment.   Past Medical History:  Diagnosis Date  . Alcohol-induced chronic pancreatitis ([place]) 01/09/2019  . Anxiety   . Anxiety and depression   . Arthritis    knees and shoulders  . Bruit    L  . Chest pain    hx  . Diabetes mellitus without complication ([place])    "borderline", diet controlled, no meds, patient has lost 30 lbs  . Edema   . Fatty liver   . GERD (gastroesophageal reflux disease)    uses Omeprazole  . Goiter   . HLD (hyperlipidemia)   . Hypertension    essential, benign  . Hypothyroidism   . Impotence of organic origin   . Murmur    never has caused any problems  . Neuromuscular disorder ([place])   . Other chest pain    tightness, pressure  . Palpitation    hx  . Precordial pain   . Sleep apnea    uses cpap      Objective/Physical Exam Neurovascular status intact.  Skin incisions appear to be well coapted with sutures and staples intact. No sign of infectious process noted. No dehiscence. No active bleeding noted. Moderate edema noted to the surgical extremity.  Radiographic Exam:  Orthopedic hardware and osteotomies sites appear to be stable with routine healing.  Assessment: 1. s/p 1st MPJ implant and Tailor's bunionectomy left. DOS: 02/17/2019   Plan of Care:  1. Patient was evaluated. X-rays reviewed 2. Dressing changed. Keep clean, dry and intact for one week.  3. Continue taking Keflex 500 mg TID.  4. Continue weightbearing in CAM boot.  5. Return to clinic in one week.    Edrick Kins, DPM [facility]  Dr. Edrick Kins, [address]                                         [place], [place] [address]                Office [phone]  Fax [phone]

## 2019-03-03 NOTE — Telephone Encounter (Signed)
Rx sent. - Dr. Kadeshia Kasparian

## 2019-03-03 NOTE — Telephone Encounter (Signed)
Pt was seen in office last Wednesday and was supposed to have a refill of oxycodone sent to his pharmacy but they have not received it.  Pharmacy is [facility] on [address] in [place]

## 2019-03-03 NOTE — Telephone Encounter (Signed)
Rx sent. - Dr. Taren Toops

## 2019-03-03 NOTE — Telephone Encounter (Signed)
Patient has called back again for his pain meds. Was suppose to be sent on Wednesday 02/26/2019 after his appointment.

## 2019-03-05 ENCOUNTER — Ambulatory Visit (INDEPENDENT_AMBULATORY_CARE_PROVIDER_SITE_OTHER): Payer: PRIVATE HEALTH INSURANCE | Admitting: Podiatry

## 2019-03-05 ENCOUNTER — Other Ambulatory Visit: Payer: Self-pay | Admitting: Podiatry

## 2019-03-05 ENCOUNTER — Other Ambulatory Visit: Payer: Self-pay

## 2019-03-05 ENCOUNTER — Other Ambulatory Visit (HOSPITAL_COMMUNITY): Payer: Self-pay | Admitting: Psychiatry

## 2019-03-05 DIAGNOSIS — Z9889 Other specified postprocedural states: Secondary | ICD-10-CM

## 2019-03-05 DIAGNOSIS — F411 Generalized anxiety disorder: Secondary | ICD-10-CM

## 2019-03-05 DIAGNOSIS — M205X2 Other deformities of toe(s) (acquired), left foot: Secondary | ICD-10-CM

## 2019-03-05 DIAGNOSIS — M21622 Bunionette of left foot: Secondary | ICD-10-CM

## 2019-03-10 NOTE — Progress Notes (Signed)
   Subjective:  Patient presents today status post 1st MPJ implant and Tailor's bunionectomy left. DOS: 02/17/2019. He states he is doing well overall. He reports some intermittent pain to the lateral foot and discoloration of the great toe. He has been using the CAM boot as directed. There are no modifying factors noted. Patient is here for further evaluation and treatment.   Past Medical History:  Diagnosis Date  . Alcohol-induced chronic pancreatitis ([place]) 01/09/2019  . Anxiety   . Anxiety and depression   . Arthritis    knees and shoulders  . Bruit    L  . Chest pain    hx  . Diabetes mellitus without complication ([place])    "borderline", diet controlled, no meds, patient has lost 30 lbs  . Edema   . Fatty liver   . GERD (gastroesophageal reflux disease)    uses Omeprazole  . Goiter   . HLD (hyperlipidemia)   . Hypertension    essential, benign  . Hypothyroidism   . Impotence of organic origin   . Murmur    never has caused any problems  . Neuromuscular disorder ([place])   . Other chest pain    tightness, pressure  . Palpitation    hx  . Precordial pain   . Sleep apnea    uses cpap      Objective/Physical Exam Neurovascular status intact.  Skin incisions appear to be well coapted with sutures and staples intact. No sign of infectious process noted. No dehiscence. No active bleeding noted. Moderate edema noted to the surgical extremity.  Assessment: 1. s/p 1st MPJ implant and Tailor's bunionectomy left. DOS: 02/17/2019   Plan of Care:  1. Patient was evaluated.  2. Partial staples removed.  3. Dressing changed.  4. Post op shoe dispensed. Discontinue using CAM boot.  5. Return to clinic in one week for remaining staple removal.    Edrick Kins, DPM [facility]  Dr. Edrick Kins, [address]                                        [place], [place] [address]                Office [phone]  Fax [phone]

## 2019-03-12 ENCOUNTER — Ambulatory Visit (INDEPENDENT_AMBULATORY_CARE_PROVIDER_SITE_OTHER): Payer: PRIVATE HEALTH INSURANCE

## 2019-03-12 ENCOUNTER — Encounter: Payer: PRIVATE HEALTH INSURANCE | Admitting: Podiatry

## 2019-03-12 ENCOUNTER — Ambulatory Visit (INDEPENDENT_AMBULATORY_CARE_PROVIDER_SITE_OTHER): Payer: PRIVATE HEALTH INSURANCE | Admitting: Podiatry

## 2019-03-12 ENCOUNTER — Other Ambulatory Visit: Payer: Self-pay | Admitting: Internal Medicine

## 2019-03-12 ENCOUNTER — Other Ambulatory Visit: Payer: Self-pay

## 2019-03-12 DIAGNOSIS — M21622 Bunionette of left foot: Secondary | ICD-10-CM

## 2019-03-12 DIAGNOSIS — M205X2 Other deformities of toe(s) (acquired), left foot: Secondary | ICD-10-CM

## 2019-03-12 DIAGNOSIS — Z9889 Other specified postprocedural states: Secondary | ICD-10-CM

## 2019-03-12 NOTE — Telephone Encounter (Signed)
Patient called in stating he would like refill sent today as he is completely out of medication and kept repeating "he gets very bad" when he does not have medication. Please advise and call patient when done.

## 2019-03-13 NOTE — Progress Notes (Signed)
Thank you very much 

## 2019-03-22 NOTE — Progress Notes (Signed)
   Subjective:  Patient presents today status post 1st MPJ implant and Tailor's bunionectomy left. DOS: 02/17/2019. He states he is doing well but reports some mild pain on the lateral left foot and around the surgical site. He has been using the post op shoe as directed. Being on the foot for long periods of time increases the pain. Patient is here for further evaluation and treatment.   Past Medical History:  Diagnosis Date  . Alcohol-induced chronic pancreatitis ([place]) 01/09/2019  . Anxiety   . Anxiety and depression   . Arthritis    knees and shoulders  . Bruit    L  . Chest pain    hx  . Diabetes mellitus without complication ([place])    "borderline", diet controlled, no meds, patient has lost 30 lbs  . Edema   . Fatty liver   . GERD (gastroesophageal reflux disease)    uses Omeprazole  . Goiter   . HLD (hyperlipidemia)   . Hypertension    essential, benign  . Hypothyroidism   . Impotence of organic origin   . Murmur    never has caused any problems  . Neuromuscular disorder ([place])   . Other chest pain    tightness, pressure  . Palpitation    hx  . Precordial pain   . Sleep apnea    uses cpap      Objective/Physical Exam Neurovascular status intact.  Skin incisions appear to be well coapted with sutures and staples intact. No sign of infectious process noted. No dehiscence. No active bleeding noted. Moderate edema noted to the surgical extremity.  Radiographic Exam:  Orthopedic hardware and osteotomies sites appear to be stable with routine healing.  Assessment: 1. s/p 1st MPJ implant and Tailor's bunionectomy left. DOS: 02/17/2019   Plan of Care:  1. Patient was evaluated. X-Rays reviewed.  2. Remaining staples removed.  3. Continue weightbearing in post op shoe.  4. Return to clinic in 4 weeks.    Edrick Kins, DPM [facility]  Dr. Edrick Kins, [address]                                        [place], [place] [address]                 Office [phone]  Fax [phone]

## 2019-03-27 ENCOUNTER — Encounter: Payer: Self-pay | Admitting: Podiatry

## 2019-03-28 ENCOUNTER — Encounter (HOSPITAL_COMMUNITY): Payer: Self-pay | Admitting: Psychiatry

## 2019-03-28 ENCOUNTER — Other Ambulatory Visit: Payer: Self-pay

## 2019-03-28 ENCOUNTER — Ambulatory Visit (INDEPENDENT_AMBULATORY_CARE_PROVIDER_SITE_OTHER): Payer: PRIVATE HEALTH INSURANCE | Admitting: Psychiatry

## 2019-03-28 DIAGNOSIS — F331 Major depressive disorder, recurrent, moderate: Secondary | ICD-10-CM | POA: Diagnosis not present

## 2019-03-28 DIAGNOSIS — F411 Generalized anxiety disorder: Secondary | ICD-10-CM | POA: Diagnosis not present

## 2019-03-28 MED ORDER — TRAZODONE HCL 100 MG PO TABS: 100.0000 mg | ORAL_TABLET | Freq: Every day | ORAL | 1 refills | Status: DC

## 2019-03-28 MED ORDER — DULOXETINE HCL 60 MG PO CPEP: ORAL_CAPSULE | ORAL | 1 refills | Status: DC

## 2019-03-28 MED ORDER — BUSPIRONE HCL 15 MG PO TABS: 15.0000 mg | ORAL_TABLET | Freq: Two times a day (BID) | ORAL | 1 refills | Status: DC

## 2019-03-28 MED ORDER — ARIPIPRAZOLE 5 MG PO TABS: ORAL_TABLET | ORAL | 1 refills | Status: DC

## 2019-03-28 NOTE — Progress Notes (Signed)
Virtual Visit via Telephone Note  I connected with [name] on 03/28/19 at  8:40 AM EST by telephone and verified that I am speaking with the correct person using two identifiers.   I discussed the limitations, risks, security and privacy concerns of performing an evaluation and management service by telephone and the availability of in person appointments. I also discussed with the patient that there may be a patient responsible charge related to this service. The patient expressed understanding and agreed to proceed.   History of Present Illness: Patient was evaluated by phone session.  On his last visit we tried Abilify since Lamictal did not help even up to 200 mg.  He is feeling somewhat better with Abilify.  He is sleeping better but he still have a lot of concerns about future.  His major stresses finances.  He had applied for disability and so far he has not here from them.  Most of his psychotropic medication is given by PCP and he feels they are not working as good.  Recently had another surgery on his big toe.  Patient has multiple health issues.  He feels some time hopeless and helpless but denies any suicidal thoughts.  Denies any mania or psychosis.  Denies drinking or using any illegal substances.  He admitted lack of energy, poor attention, concentration, anhedonia, lack of motivation to do things.  He is taking care of his wife who has also a lot of health issues.  He admitted there are days when he cried.  He has not started therapy yet.    Past Psychiatric History:Reviewed. H/Odepression and anxiety. No h/osuicidal attempt or inpatient treatment. Seen Dr. Jake Michaelis and given Zoloft, Klonopin and Cymbalta. Later PCP added BuSpar and trazodone. We tried Lamictal but did not help.  Psychiatric Specialty Exam: Physical Exam  ROS  There were no vitals taken for this visit.There is no height or weight on file to calculate BMI.  General Appearance: NA  Eye Contact:  NA   Speech:  Clear and Coherent and Slow  Volume:  Decreased  Mood:  Anxious and Dysphoric  Affect:  NA  Thought Process:  Goal Directed  Orientation:  Full (Time, Place, and Person)  Thought Content:  Rumination  Suicidal Thoughts:  No  Homicidal Thoughts:  No  Memory:  Immediate;   Good Recent;   Good Remote;   Good  Judgement:  Good  Insight:  Good  Psychomotor Activity:  NA  Concentration:  Concentration: Fair and Attention Span: Fair  Recall:  Good  Fund of Knowledge:  Good  Language:  Good  Akathisia:  No  Handed:  Right  AIMS (if indicated):     Assets:  Communication Skills Desire for Improvement Resilience Social Support  ADL's:  Intact  Cognition:  WNL  Sleep:   6 hrs      Assessment and Plan: Major depressive disorder, recurrent.  Generalized anxiety disorder.  Patient seen marginal improvement with Abilify.  He is taking Cymbalta, trazodone, BuSpar and Klonopin from his PCP.  Now he wants psychotropic medication to be given by our office because he feels that does need to be adjusted.  I agree with the plan.  I would increase trazodone to 100 mg so he can sleep better.  We will also increase BuSpar from 10 mg twice a day to 50 mg twice a day to help his anxiety.  Continue Cymbalta 60 mg daily and Abilify 5 mg daily.  His PCP is giving him Klonopin  1 mg twice a day.  I reminded again about the side effects of psychotropic medication especially Abilify that can cause EPS and metabolic syndrome.  He is now agreed to find a therapist for him and we will schedule appointment for a therapist in our office.  I recommended to call us back if is any question or any concern.  He is also requesting to send our records to his lawyer and I explained once we received the consent from him we are happy to send the records to his lawyer.  Follow-up in 2 months.  Time spent 30 minutes.  I will also forward my note to his primary care physician.  Follow Up Instructions:    I discussed  the assessment and treatment plan with the patient. The patient was provided an opportunity to ask questions and all were answered. The patient agreed with the plan and demonstrated an understanding of the instructions.   The patient was advised to call back or seek an in-person evaluation if the symptoms worsen or if the condition fails to improve as anticipated.  I provided 30 minutes of non-face-to-face time during this encounter.   Kathlee Nations, MD

## 2019-04-07 ENCOUNTER — Other Ambulatory Visit: Payer: Self-pay | Admitting: Internal Medicine

## 2019-04-07 NOTE — Telephone Encounter (Signed)
Last refilled by a different provider.  Last OV: 01/08/2019 Next OV: not scheduled

## 2019-04-10 ENCOUNTER — Other Ambulatory Visit: Payer: Self-pay

## 2019-04-10 ENCOUNTER — Other Ambulatory Visit: Payer: Self-pay | Admitting: Surgical

## 2019-04-10 ENCOUNTER — Telehealth: Payer: Self-pay

## 2019-04-10 ENCOUNTER — Ambulatory Visit (INDEPENDENT_AMBULATORY_CARE_PROVIDER_SITE_OTHER): Payer: PRIVATE HEALTH INSURANCE | Admitting: Orthopedic Surgery

## 2019-04-10 ENCOUNTER — Ambulatory Visit (INDEPENDENT_AMBULATORY_CARE_PROVIDER_SITE_OTHER): Payer: PRIVATE HEALTH INSURANCE

## 2019-04-10 DIAGNOSIS — Z96651 Presence of right artificial knee joint: Secondary | ICD-10-CM | POA: Diagnosis not present

## 2019-04-10 DIAGNOSIS — M7122 Synovial cyst of popliteal space [Baker], left knee: Secondary | ICD-10-CM

## 2019-04-10 MED ORDER — TRAMADOL HCL 50 MG PO TABS: 50.0000 mg | ORAL_TABLET | Freq: Every day | ORAL | 0 refills | Status: DC | PRN

## 2019-04-10 NOTE — Telephone Encounter (Signed)
Patient would like a Rx for left knee pain sent to [facility] in [place], [place].  Cb# [phone].  Please advise.  Thank you.

## 2019-04-10 NOTE — Telephone Encounter (Signed)
Please advise. Thanks.  

## 2019-04-11 ENCOUNTER — Encounter: Payer: Self-pay | Admitting: Orthopedic Surgery

## 2019-04-11 NOTE — Progress Notes (Signed)
Office Visit Note   Patient: [name]           Date of Birth: 1965/08/12           MRN: [name] Visit Date: 04/10/2019 Requested by: [name], MD [address],  [place] [address] PCP: [name], MD  Subjective: Chief Complaint  Patient presents with  . Right Knee - Pain  . Left Knee - Pain    HPI: [name] is a 53 y.o. male who presents to the office complaining of bilateral knee pain.  Patient returns for evaluation of left knee pain s/p left knee arthroscopy with meniscal debridement and removal of loose body on 12/17/2018.  He states that he has had significant swelling of the posterior and medial aspect of his left knee.  He denies any mechanical symptoms or instability.  Patient also notes pain of the right knee.  He has a history of right total knee arthroplasty with revision done in 2016.  He localizes his pain to the lateral aspect.  He denies any acute injury.  Denies any fevers, chills, malaise, swelling.  He has had pain for 3 weeks.  He also notes a popping sensation over the lateral aspect of his knee..                ROS:  All systems reviewed are negative as they relate to the chief complaint within the history of present illness.  Patient denies fevers or chills.  Assessment & Plan: Visit Diagnoses:  1. Baker's cyst of knee, left   2. History of arthroplasty of right knee     Plan: Patient is a 53 year old male who presents complaining of bilateral knee pain.  His main concern is the left knee pain and swelling.  He has significant swelling of the posterior and medial aspect of his left knee.  He is tender throughout these areas.  Ultrasound revealed a large bi-lobed Baker's cyst.  Discussed options available to the patient including aspiration versus conservative management versus surgery.  After lengthy discussion patient decided to try and treat her conservatively and let the cyst resorb with time.  He will return to  the office for aspiration if his pain worsens.  We could consider ultrasound-guided aspiration and injection of the popliteal cyst region versus excision if the symptoms do not improve.  The mass is in a location which could be more symptomatic than the standard Baker's cyst.  As for the right knee pain, he does have some small amount of scar tissue that seems to be popping over the lateral aspect of the tibial plateau.  X-rays are negative for any change in position of implant since the last x-rays.  Recommended the patient try topical Voltaren gel over this site of his pain.  Pain is very focal anterior lateral tibial plateau on the right knee.  I think there is anything really to be done about that on an acute basis.  I think he may be having some type of low level inflammatory reaction to the cement with some evidence of lucency at the inferior bone cement interface at the tibial plateau region.  Does not extend down the stem.  Patient agreed with the plan.  He will follow-up as needed.  Follow-Up Instructions: No follow-ups on file.   Orders:  Orders Placed This Encounter  Procedures  . XR Knee 1-2 Views Right   No orders of the defined types were placed in this  encounter.     Procedures: No procedures performed   Clinical Data: No additional findings.  Objective: Vital Signs: There were no vitals taken for this visit.  Physical Exam:  Constitutional: Patient appears well-developed HEENT:  Head: Normocephalic Eyes:EOM are normal Neck: Normal range of motion Cardiovascular: Normal rate Pulmonary/chest: Effort normal Neurologic: Patient is alert Skin: Skin is warm Psychiatric: Patient has normal mood and affect  Ortho Exam:  Bilateral knee Exam No effusion No redness or significant warmth of either knee Extensor mechanism intact Tender to palpation of the medial aspect of the left knee and the posterior Baker's cyst.  Lateral joint line tenderness to palpation of the left  knee. Lateral joint line tenderness palpation of the right knee with small localized scar tissue that is tender to palpation specifically.  This pops over the proximal tibia with range of motion of the right knee. Stable to varus/valgus stresses.  Stable to anterior/posterior drawer Extension to 0 degrees Flexion > 90 degrees  Specialty Comments:  No specialty comments available.  Imaging: No results found.   PMFS History: Patient Active Problem List   Diagnosis Date Noted  . Decreased GFR 01/09/2019  . Benzodiazepine withdrawal with complication ([place]) 123XX123  . 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 Spinal stenosis of cervical region 12/31/2018  . Loose body of left knee   . Acute medial meniscus tear of left knee   . Plantar fasciitis of left foot 08/24/2017  . Burning sensation of feet 06/20/2017  . Chronic pain of both hips 06/20/2017  . Angioedema of lips 11/26/2016  . Cognitive complaints with normal neuropsychological exam 10/23/2016  . 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 Hypothyroidism due to acquired atrophy of thyroid 09/14/2015  . Arthritis of knee 06/23/2014  . Encounter for preventive health examination 06/17/2014  . Enlarged RV (right ventricle) 06/17/2014  . GERD (gastroesophageal reflux disease) 04/20/2014  . Cervical spine degeneration 02/17/2014  . Overweight 10/29/2013  . Chronic knee pain 10/29/2013  . Diabetes mellitus type 2 in obese ([facility]) 10/23/2012  . Seborrheic dermatitis, unspecified 05/17/2012  . Fatty liver 04/22/2012  . Hypogonadism male 03/12/2012  . Sciatica of right side 12/24/2010  . Hyperlipidemia associated with type 2 diabetes mellitus ([facility]) 01/13/2009  . Anxiety state 01/13/2009  . Essential hypertension 01/13/2009  . OSA (obstructive sleep apnea) 01/13/2009   Past Medical History:  Diagnosis Date  .  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 Anxiety   . Anxiety and depression   . Arthritis    knees and shoulders  . Bruit    L  . Chest pain    hx  . Diabetes mellitus without complication ([place] [facility])    "borderline", diet controlled, no meds, patient has lost 30 lbs  . Edema   . Fatty liver   . GERD (gastroesophageal reflux disease)    uses Omeprazole  . Goiter   . HLD (hyperlipidemia)   . Hypertension    essential, benign  . Hypothyroidism   . Impotence of organic origin   . Murmur    never has caused any problems  . Neuromuscular disorder ([place])   . Other chest pain    tightness, pressure  . Palpitation    hx  . Precordial pain   . Sleep apnea    uses cpap    Family History  Problem Relation Age of Onset  . Heart disease Father  Past Surgical History:  Procedure Laterality Date  . APPENDECTOMY  2002   [facility] at [facility]  . CARDIAC CATHETERIZATION    . CARDIAC CATHETERIZATION N/A 08/27/2015   Procedure: Left Heart Cath and Coronary Angiography;  Surgeon: Jolaine Artist, MD;  Location: [facility];  Service: Cardiovascular;  Laterality: N/A;  . COLONOSCOPY    . HERNIA REPAIR  [id number]   umbilical  . JOINT REPLACEMENT     right knee  . KNEE ARTHROSCOPY Left 12/17/2018   Procedure: left knee arthroscopy, meniscal debridement, loose body removal;  Surgeon: Meredith Pel, MD;  Location: [facility];  Service: Orthopedics;  Laterality: Left;  . REVISION TOTAL KNEE ARTHROPLASTY Right 06/23/2014   DR Marlou Sa  . TONSILLECTOMY    . TOTAL KNEE ARTHROPLASTY Right 2011   right  . TOTAL KNEE REVISION Right 06/23/2014   Procedure: TOTAL KNEE REVISION;  Surgeon: Meredith Pel, MD;  Location: [facility];  Service: Orthopedics;  Laterality: Right;  . WISDOM TOOTH EXTRACTION     Social History   Occupational History  . Occupation: IT trainer  . Occupation: SERVICE DIRECTOR    Employer: [employer]    Tobacco Use  . Smoking status: Former Smoker    Packs/day: 2.00    Years: 18.00    Pack years: 36.00    Types: Cigarettes    Quit date: 04/24/2005    Years since quitting: 13.9  . Smokeless tobacco: Never Used  . Tobacco comment: quit 20 years ago   Substance and Sexual Activity  . Alcohol use: Yes    Alcohol/week: 8.0 - 9.0 standard drinks    Types: 6 Cans of beer, 2 - 3 Shots of liquor per week    Comment: beer/liquor  . Drug use: No  . Sexual activity: Yes

## 2019-04-21 ENCOUNTER — Ambulatory Visit (INDEPENDENT_AMBULATORY_CARE_PROVIDER_SITE_OTHER): Payer: PRIVATE HEALTH INSURANCE | Admitting: Podiatry

## 2019-04-21 ENCOUNTER — Encounter: Payer: Self-pay | Admitting: Podiatry

## 2019-04-21 ENCOUNTER — Other Ambulatory Visit: Payer: Self-pay

## 2019-04-21 DIAGNOSIS — M21622 Bunionette of left foot: Secondary | ICD-10-CM

## 2019-04-21 DIAGNOSIS — Z9889 Other specified postprocedural states: Secondary | ICD-10-CM

## 2019-04-21 DIAGNOSIS — M205X2 Other deformities of toe(s) (acquired), left foot: Secondary | ICD-10-CM

## 2019-04-22 ENCOUNTER — Other Ambulatory Visit: Payer: Self-pay | Admitting: Internal Medicine

## 2019-04-27 NOTE — Progress Notes (Signed)
   Subjective:  Patient presents today status post 1st MPJ implant and Tailor's bunionectomy left. DOS: 02/17/2019. He states he is doing well and improving. He reports some swelling of the bilateral ankles. He has been wearing a regular shoe because he states his CAM boot broke. There are no modifying factors noted. Patient is here for further evaluation and treatment.   Past Medical History:  Diagnosis Date  . Alcohol-induced chronic pancreatitis ([place]) 01/09/2019  . Anxiety   . Anxiety and depression   . Arthritis    knees and shoulders  . Bruit    L  . Chest pain    hx  . Diabetes mellitus without complication ([facility])    "borderline", diet controlled, no meds, patient has lost 30 lbs  . Edema   . Fatty liver   . GERD (gastroesophageal reflux disease)    uses Omeprazole  . Goiter   . HLD (hyperlipidemia)   . Hypertension    essential, benign  . Hypothyroidism   . Impotence of organic origin   . Murmur    never has caused any problems  . Neuromuscular disorder ([place])   . Other chest pain    tightness, pressure  . Palpitation    hx  . Precordial pain   . Sleep apnea    uses cpap      Objective/Physical Exam Neurovascular status intact.  Skin incisions appear to be well coapted. No sign of infectious process noted. No dehiscence. No active bleeding noted. Bilateral ankle edema noted.   Assessment: 1. s/p 1st MPJ implant and Tailor's bunionectomy left. DOS: 02/17/2019 2. Bilateral ankle edema    Plan of Care:  1. Patient was evaluated.  2. May resume full activity with no restrictions.  3. Recommended good shoe gear.  4. Compression anklets dispensed bilaterally.  5. Return to clinic as needed.   Edrick Kins, DPM [facility]  Dr. Edrick Kins, [address]                                        [place], [place] [address]                Office [phone]  Fax [phone]

## 2019-05-12 ENCOUNTER — Other Ambulatory Visit: Payer: Self-pay | Admitting: Podiatry

## 2019-06-03 ENCOUNTER — Other Ambulatory Visit: Payer: Self-pay

## 2019-06-03 ENCOUNTER — Encounter (HOSPITAL_COMMUNITY): Payer: Self-pay | Admitting: Psychiatry

## 2019-06-03 ENCOUNTER — Ambulatory Visit (INDEPENDENT_AMBULATORY_CARE_PROVIDER_SITE_OTHER): Payer: PRIVATE HEALTH INSURANCE | Admitting: Psychiatry

## 2019-06-03 DIAGNOSIS — F411 Generalized anxiety disorder: Secondary | ICD-10-CM

## 2019-06-03 DIAGNOSIS — F331 Major depressive disorder, recurrent, moderate: Secondary | ICD-10-CM

## 2019-06-03 MED ORDER — BUPROPION HCL ER (XL) 150 MG PO TB24: 150.0000 mg | ORAL_TABLET | Freq: Every day | ORAL | 0 refills | Status: DC

## 2019-06-03 MED ORDER — DULOXETINE HCL 30 MG PO CPEP: ORAL_CAPSULE | ORAL | 0 refills | Status: DC

## 2019-06-03 MED ORDER — BUSPIRONE HCL 15 MG PO TABS: 15.0000 mg | ORAL_TABLET | Freq: Two times a day (BID) | ORAL | 0 refills | Status: DC

## 2019-06-03 MED ORDER — ARIPIPRAZOLE 5 MG PO TABS: ORAL_TABLET | ORAL | 0 refills | Status: DC

## 2019-06-03 MED ORDER — TRAZODONE HCL 100 MG PO TABS: 100.0000 mg | ORAL_TABLET | Freq: Every day | ORAL | 0 refills | Status: DC

## 2019-06-03 NOTE — Progress Notes (Signed)
Virtual Visit via Telephone Note  I connected with [name] on 06/03/19 at 10:40 AM EST by telephone and verified that I am speaking with the correct person using two identifiers.   I discussed the limitations, risks, security and privacy concerns of performing an evaluation and management service by telephone and the availability of in person appointments. I also discussed with the patient that there may be a patient responsible charge related to this service. The patient expressed understanding and agreed to proceed.   History of Present Illness: Patient was evaluated through phone session.  He is very sad and disappointed because he received a letter last week that his Social Security disability is denied.  This is his third time and he had tried to Chief Executive Officer.  He is thinking to appeal the decision.  He admitted a lot of stress because of finances.  He admitted feeling hopeless, helpless but denies any suicidal thoughts.  Last month his wife had another stroke and he is very concerned about the situation.  He admitted fatigue, lack of attention, lack of energy, anhedonia and motivation to do things.  He reported his attention span is so low that he cannot function and keep any job.  He is not sure why his case did not approved as he was very hopeful.  On the last visit we have adjusted his psychotropic medication and he did notice some improvement in sleep.  He is tolerating his medication.  He is taking multiple medication but denies any tremors, shakes or any side effects.  He admitted having crying spells but denies any paranoia, hallucination.    Past Psychiatric History:Reviewed. H/Odepression and anxiety. No h/osuicidal attempt or inpatient treatment. Seen Dr. Jake Michaelis and given Zoloft, Klonopin and Cymbalta. Later PCP added BuSpar and trazodone.We tried Lamictal but did not help.  Psychiatric Specialty Exam: Physical Exam  Review of Systems  There were no vitals taken for this  visit.There is no height or weight on file to calculate BMI.  General Appearance: NA  Eye Contact:  NA  Speech:  Slow  Volume:  Decreased  Mood:  Anxious, Depressed and Hopeless  Affect:  NA  Thought Process:  Descriptions of Associations: Intact  Orientation:  Full (Time, Place, and Person)  Thought Content:  Rumination  Suicidal Thoughts:  No  Homicidal Thoughts:  No  Memory:  Immediate;   Fair Recent;   Fair Remote;   Fair  Judgement:  Intact  Insight:  Present  Psychomotor Activity:  NA  Concentration:  Concentration: Fair and Attention Span: Fair  Recall:  [employer] of Knowledge:  Good  Language:  Good  Akathisia:  No  Handed:  Right  AIMS (if indicated):     Assets:  Communication Skills Desire for Improvement Housing  ADL's:  Intact  Cognition:  WNL  Sleep:   fair      Assessment and Plan: Major depressive disorder, recurrent.  Generalized anxiety disorder.  Patient continued to endorse significant depressive symptoms and having difficulty in attention, concentration, anhedonia and lack of motivation to do things.  I reviewed his current medication.  We have increase his BuSpar, trazodone on the last visit.  He is also taking Cymbalta and Abilify.  He was getting his medication from his PCP until the last visit.  I discussed if he wants to try Wellbutrin which can help his attention focus and energy level since he has not tried before.  He agreed with the plan.  I will reduce Cymbalta  from 60 mg to 30 mg and you can try Wellbutrin XL 150 mg in the morning.  He will continue BuSpar 15 mg twice a day, Abilify 5 mg daily and trazodone 100 mg at bedtime.  I discussed medication side effect specially Wellbutrin can cause tremors shakes.  He is getting Klonopin from his PCP.  I also recommend that he should consider checking his thyroid and testosterone level since that may contribute to low energy.  He promised that he will discuss with his PCP.  We discussed safety concerns  and anytime having active suicidal thoughts or homicidal thought that he need to call 911 or go to local emergency room.  He is agreed to schedule appointment for therapy in our office and we will do that.  Follow-up in 4 weeks.  Follow Up Instructions:    I discussed the assessment and treatment plan with the patient. The patient was provided an opportunity to ask questions and all were answered. The patient agreed with the plan and demonstrated an understanding of the instructions.   The patient was advised to call back or seek an in-person evaluation if the symptoms worsen or if the condition fails to improve as anticipated.  I provided 25 minutes of non-face-to-face time during this encounter.   Kathlee Nations, MD

## 2019-06-04 ENCOUNTER — Telehealth: Payer: Self-pay | Admitting: Orthopaedic Surgery

## 2019-06-04 ENCOUNTER — Other Ambulatory Visit: Payer: Self-pay

## 2019-06-04 ENCOUNTER — Ambulatory Visit (INDEPENDENT_AMBULATORY_CARE_PROVIDER_SITE_OTHER): Payer: 59 | Admitting: Orthopedic Surgery

## 2019-06-04 DIAGNOSIS — M25462 Effusion, left knee: Secondary | ICD-10-CM | POA: Diagnosis not present

## 2019-06-04 DIAGNOSIS — M25562 Pain in left knee: Secondary | ICD-10-CM

## 2019-06-04 NOTE — Telephone Encounter (Signed)
Called patient left voicemail message to return call to schedule an appointment per his request on mychart with Dr. Lorin Mercy for cervical and spine pains

## 2019-06-07 ENCOUNTER — Encounter: Payer: Self-pay | Admitting: Orthopedic Surgery

## 2019-06-07 DIAGNOSIS — M25462 Effusion, left knee: Secondary | ICD-10-CM | POA: Diagnosis not present

## 2019-06-07 MED ORDER — METHYLPREDNISOLONE ACETATE 40 MG/ML IJ SUSP
40.0000 mg | INTRAMUSCULAR | Status: AC | PRN
  Administered 2019-06-07: 40 mg via INTRA_ARTICULAR

## 2019-06-07 MED ORDER — BUPIVACAINE HCL 0.25 % IJ SOLN
4.0000 mL | INTRAMUSCULAR | Status: AC | PRN
  Administered 2019-06-07: 4 mL via INTRA_ARTICULAR

## 2019-06-07 MED ORDER — LIDOCAINE HCL 1 % IJ SOLN
5.0000 mL | INTRAMUSCULAR | Status: AC | PRN
  Administered 2019-06-07: 5 mL

## 2019-06-07 NOTE — Progress Notes (Signed)
Office Visit Note   Patient: [name]           Date of Birth: [birth year]           MRN: [id number] Visit Date: 06/04/2019 Requested by: Joshua Mc, MD [address],  [place] [id number] PCP: Joshua Mc, MD  Subjective: Chief Complaint  Patient presents with  . Left Knee - Pain    HPI: [name] is a patient with left knee pain.  Reports pain both posterior and medial.  Describes some locking and popping.  Had left knee arthroscopy last year for meniscal pathology.  Has an atypical Baker's cyst which is more anterior than the typical Baker's cyst.  Patient states that the pain will wake him from sleep at night.  Does report some back pain but no discrete radicular symptoms.  Did have some arthritis in the knee at the time of arthroscopy.              ROS: All systems reviewed are negative as they relate to the chief complaint within the history of present illness.  Patient denies  fevers or chills.   Assessment & Plan: Visit Diagnoses:  1. Left knee pain, unspecified chronicity     Plan: Impression is left knee pain with possible exacerbation of some existing arthritis and possibly symptomatic atypical Baker's cyst which is slightly more proximal and anterior than the standard Baker's cyst.  Plan is aspiration and injection of that left knee today with MRI scan of that left knee to evaluate this atypical Baker's cyst.  Ultrasound examination today demonstrates less of a cystic structure and more of a solid type amorphous structure.  We will see him back after that study.  Follow-Up Instructions: Return for after MRI.   Orders:  Orders Placed This Encounter  Procedures  . MR Knee Left w/o contrast   No orders of the defined types were placed in this encounter.     Procedures: Large Joint Inj: L knee on 06/07/2019 1:19 PM Indications: diagnostic evaluation, joint swelling and pain Details: 18 G 1.5 in needle, superolateral  approach  Arthrogram: No  Medications: 5 mL lidocaine 1 %; 40 mg methylPREDNISolone acetate 40 MG/ML; 4 mL bupivacaine 0.25 % Outcome: tolerated well, no immediate complications Procedure, treatment alternatives, risks and benefits explained, specific risks discussed. Consent was given by the patient. Immediately prior to procedure a time out was called to verify the correct patient, procedure, equipment, support staff and site/side marked as required. Patient was prepped and draped in the usual sterile fashion.       Clinical Data: No additional findings.  Objective: Vital Signs: There were no vitals taken for this visit.  Physical Exam:   Constitutional: Patient appears well-developed HEENT:  Head: Normocephalic Eyes:EOM are normal Neck: Normal range of motion Cardiovascular: Normal rate Pulmonary/chest: Effort normal Neurologic: Patient is alert Skin: Skin is warm Psychiatric: Patient has normal mood and affect    Ortho Exam: Ortho exam demonstrates mild left knee effusion with stable collateral cruciate ligaments.  Range of motion is good with full extension to about 115 of flexion.  Does have tenderness to palpation around the posterior aspect of the knee slightly anterior.  No definite cystic structure is present but there is a fullness within the distal hamstring and VMO region medially.  This is examined under ultrasound and is found to have more of a congealed type of coagulants blood appearance versus cystic fluid appearance.  Specialty Comments:  No specialty comments available.  Imaging: No results found.   PMFS History: Patient Active Problem List   Diagnosis Date Noted  . Decreased GFR 01/09/2019  . Benzodiazepine withdrawal with complication ([place]) 123XX123  . 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 Spinal stenosis of cervical region 12/31/2018  . Loose body of left knee   . Acute medial meniscus tear of left knee   . Plantar fasciitis of left foot  08/24/2017  . Burning sensation of feet 06/20/2017  . Chronic pain of both hips 06/20/2017  . Angioedema of lips 11/26/2016  . Cognitive complaints with normal neuropsychological exam 10/23/2016  . 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 Hypothyroidism due to acquired atrophy of thyroid 09/14/2015  . Arthritis of knee 06/23/2014  . Encounter for preventive health examination 06/17/2014  . Enlarged RV (right ventricle) 06/17/2014  . GERD (gastroesophageal reflux disease) 04/20/2014  . Cervical spine degeneration 02/17/2014  . Overweight 10/29/2013  . Chronic knee pain 10/29/2013  . Diabetes mellitus type 2 in obese ([place]) 10/23/2012  . Seborrheic dermatitis, unspecified 05/17/2012  . Fatty liver 04/22/2012  . Hypogonadism male 03/12/2012  . Sciatica of right side 12/24/2010  . Hyperlipidemia associated with type 2 diabetes mellitus ([place]) 01/13/2009  . Anxiety state 01/13/2009  . Essential hypertension 01/13/2009  . OSA (obstructive sleep apnea) 01/13/2009   Past Medical History:  Diagnosis Date  . Alcohol-induced chronic pancreatitis ([place]) 01/09/2019  . Anxiety   . Anxiety and depression   . Arthritis    knees and shoulders  . Bruit    L  . Chest pain    hx  . Diabetes mellitus without complication ([place])    "borderline", diet controlled, no meds, patient has lost 30 lbs  . Edema   . Fatty liver   . GERD (gastroesophageal reflux disease)    uses Omeprazole  . Goiter   . HLD (hyperlipidemia)   . Hypertension    essential, benign  . Hypothyroidism   . Impotence of organic origin   . Murmur    never has caused any problems  . Neuromuscular disorder ([place])   . Other chest pain    tightness, pressure  . Palpitation    hx  . Precordial pain   . Sleep apnea    uses cpap    Family History  Problem Relation Age of Onset  . Heart disease Father      Past Surgical History:  Procedure Laterality Date  . APPENDECTOMY  2002   done at [facility]  . CARDIAC CATHETERIZATION    . CARDIAC CATHETERIZATION N/A 08/27/2015   Procedure: Left Heart Cath and Coronary Angiography;  Surgeon: Jolaine Artist, MD;  Location: [place] [facility];  Service: Cardiovascular;  Laterality: N/A;  . COLONOSCOPY    . HERNIA REPAIR  [id number]   umbilical  . JOINT REPLACEMENT     right knee  . KNEE ARTHROSCOPY Left 12/17/2018   Procedure: left knee arthroscopy, meniscal debridement, loose body removal;  Surgeon: Meredith Pel, MD;  Location: [place];  Service: Orthopedics;  Laterality: Left;  . REVISION TOTAL KNEE ARTHROPLASTY Right 06/23/2014   DR Marlou Sa  . TONSILLECTOMY    . TOTAL KNEE ARTHROPLASTY Right 2011   right  . TOTAL KNEE REVISION Right 06/23/2014   Procedure: TOTAL KNEE REVISION;  Surgeon: Meredith Pel, MD;  Location: [facility];  Service: Orthopedics;  Laterality: Right;  .  WISDOM TOOTH EXTRACTION     Social History   Occupational History  . Occupation: IT trainer  . Occupation: SERVICE DIRECTOR    Employer: [employer]  Tobacco Use  . Smoking status: Former Smoker    Packs/day: 2.00    Years: 18.00    Pack years: 36.00    Types: Cigarettes    Quit date: 04/24/2005    Years since quitting: 14.1  . Smokeless tobacco: Never Used  . Tobacco comment: quit 20 years ago   Substance and Sexual Activity  . Alcohol use: Yes    Alcohol/week: 8.0 - 9.0 standard drinks    Types: 6 Cans of beer, 2 - 3 Shots of liquor per week    Comment: beer/liquor  . Drug use: No  . Sexual activity: Yes

## 2019-06-09 ENCOUNTER — Ambulatory Visit (INDEPENDENT_AMBULATORY_CARE_PROVIDER_SITE_OTHER): Payer: 59 | Admitting: Licensed Clinical Social Worker

## 2019-06-09 ENCOUNTER — Other Ambulatory Visit: Payer: Self-pay | Admitting: Surgical

## 2019-06-09 ENCOUNTER — Telehealth: Payer: Self-pay

## 2019-06-09 ENCOUNTER — Other Ambulatory Visit: Payer: Self-pay

## 2019-06-09 DIAGNOSIS — F0632 Mood disorder due to known physiological condition with major depressive-like episode: Secondary | ICD-10-CM

## 2019-06-09 MED ORDER — TRAMADOL HCL 50 MG PO TABS: 50.0000 mg | ORAL_TABLET | Freq: Every day | ORAL | 0 refills | Status: DC | PRN
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
## 2019-06-09 NOTE — Telephone Encounter (Signed)
Patient called wantint to know the status of his MRI scan being scheduled. Please advise. Thanks.  [phone]

## 2019-06-09 NOTE — Telephone Encounter (Signed)
Patient said he was seen last week by Dr Marlou Sa and something was supposed to be called in for him for pain? Uses [facility] [place]

## 2019-06-10 ENCOUNTER — Other Ambulatory Visit: Payer: Self-pay

## 2019-06-10 ENCOUNTER — Encounter: Payer: Self-pay | Admitting: Orthopaedic Surgery

## 2019-06-10 ENCOUNTER — Ambulatory Visit (INDEPENDENT_AMBULATORY_CARE_PROVIDER_SITE_OTHER): Payer: 59 | Admitting: Orthopaedic Surgery

## 2019-06-10 VITALS — BP 105/62 | HR 72 | Ht 72.0 in | Wt 218.0 lb

## 2019-06-10 DIAGNOSIS — G5602 Carpal tunnel syndrome, left upper limb: Secondary | ICD-10-CM | POA: Diagnosis not present

## 2019-06-10 DIAGNOSIS — M4802 Spinal stenosis, cervical region: Secondary | ICD-10-CM

## 2019-06-10 DIAGNOSIS — M5412 Radiculopathy, cervical region: Secondary | ICD-10-CM

## 2019-06-10 NOTE — Progress Notes (Signed)
Office Visit Note   Patient: [name]           Date of Birth: 19-Jan-1966           MRN: [name] Visit Date: 06/10/2019              Requested by: [name], MD [id number] [place],  [place] [address] PCP: [name], MD   Assessment & Plan: Visit Diagnoses:  1. Spinal stenosis of cervical region   2. Radiculopathy, cervical region   3. Carpal tunnel syndrome, left upper limb     Plan: Patient might proceed with single level cervical fusion C5-6.  We would perform his left carpal tunnel release during the same anesthetic setting.  He stay overnight be discharged the following morning.  We discussed use of a soft collar for 6 weeks.  Surgery discussed including risks of dysphagia dysphonia, pseudoarthrosis.  He understands he may develop carpal tunnel syndrome at some point later in the opposite hand.  Questions were elicited and answered he understands and requests we proceed since his symptoms have gradually progressed over the last 6 months denies having problems with activities of daily living pain that wakes him up at night from sleeping and problems driving and doing household activities helping his wife.  Follow-Up Instructions: No follow-ups on file.   Orders:  No orders of the defined types were placed in this encounter.  No orders of the defined types were placed in this encounter.     Procedures: No procedures performed   Clinical Data: No additional findings.   Subjective: Chief Complaint  Patient presents with  . Neck - Pain  . Lower Back - Pain    HPI 54 year old male returns for cervical radiculopathy with cervical stenosis at C5-6 disc protrusion and also moderately severe left carpal tunnel syndrome with neck and left hand numbness and weakness.  I last saw him in September and in the interim his wife went into heart failure ended up having a left ventricular assist device is finally back home after many weeks in the  hospital and in the intensive care now she is finally back walking and still has the left ventricular assist device.  He states he is now ready to proceed with cervical fusion at C5-6 and left carpal tunnel release due to his persistent symptoms now going on for greater than a year.  He has been on gabapentin and also ibuprofen.  He is used a splint on his hand.  Recently got tramadol 50 mg 20 tablets sent by Dr. Marlou Sa.  Patient states his neck and left arm bothers him all the time with weakness.  His weakness when he does pulling or lifting numbness in his hand problems when he drives the car and drops objects.  Denies problems with his opposite right arm no gait disturbance no chills or fever.  No history of falls.  Patient does have some mild low back pain.  He is not been working since he is having to help his wife and after being out for 6 months they were placed on the job.  He states he left to look for employment with a different company but feels like he needs to get his neck taken care of as well as carpal tunnel first.  Review of Systems positive history of depression type 2 diabetes, hypertension hyperlipidemia and hypothyroidism.  He does have some mild sleep apnea.  Chronic left hand numbness with carpal tunnel.  Negative for angina negative for CVA.   Objective: Vital Signs: BP 105/62   Pulse 72   Ht 6' (1.829 m)   Wt 218 lb (98.9 kg)   BMI 29.57 kg/m   Physical Exam Constitutional:      Appearance: He is well-developed.  HENT:     Head: Normocephalic and atraumatic.  Eyes:     Pupils: Pupils are equal, round, and reactive to light.  Neck:     Thyroid: No thyromegaly.     Trachea: No tracheal deviation.  Cardiovascular:     Rate and Rhythm: Normal rate.  Pulmonary:     Effort: Pulmonary effort is normal.     Breath sounds: No wheezing.  Abdominal:     General: Bowel sounds are normal.     Palpations: Abdomen is soft.  Skin:    General: Skin is warm and dry.      Capillary Refill: Capillary refill takes less than 2 seconds.  Neurological:     Mental Status: He is alert and oriented to person, place, and time.  Psychiatric:        Behavior: Behavior normal.        Thought Content: Thought content normal.        Judgment: Judgment normal.     Ortho Exam positive Spurling on the left positive brachial plexus tenderness moderate to severe on the left minimal on the right.  Pain with carpal compression on the left minimal on the right positive Phalen's on the left.  No biceps or triceps weakness on manual testing.  He has some slight supination weakness on the left normal on the right.  Knee and ankle jerk are intact.  Biceps triceps brachial radialis reflex testing shows absent brachioradialis on the left otherwise all 2+.  Decreased two-point radial 3 fingers.  Slight thumb abductor weakness with resisted testing.  No interosseous weakness no weakness of finger flexors.  Specialty Comments:  No specialty comments available.  Imaging: CLINICAL DATA:  Neck pain, initial exam, evaluate for source of left radicular arm pain  EXAM: MRI CERVICAL SPINE WITHOUT CONTRAST  TECHNIQUE: Multiplanar, multisequence MR imaging of the cervical spine was performed. No intravenous contrast was administered.  COMPARISON:  Cervical spine radiographs 08/16/2018, cervical spine MRI 11/26/2013  FINDINGS: Multiple sequences are motion degraded.  Alignment: Straightening of the expected cervical lordosis. No significant spondylolisthesis  Vertebrae: Congenital segmentation anomaly within the upper cervical spine in the form of incomplete C2-C3 segmentation. By this numbering system, the superior most ribs are designated T1. Vertebral body height is maintained. No suspicious osseous lesions.  Cord: Within the limitations of motion degradation, no spinal cord signal abnormality identified.  Posterior Fossa, vertebral arteries, paraspinal tissues:  No abnormality  Disc levels:  Mild multilevel disc degeneration greatest at C5-C6.  C2-C3: Congenital anomaly with incomplete segmentation. No significant spinal canal narrowing.  C3-C4: Disc bulge. Right greater than left disc osteophyte ridge/uncinate hypertrophy. Facet hypertrophy (also greater on the right). No significant spinal canal stenosis. Moderate right with mild left neural foraminal narrowing. Findings are similar to prior MRI.  C4-C5: Disc bulge. Uncinate/facet hypertrophy. Mild spinal canal stenosis. Mild right with moderate/severe left neural foraminal narrowing. Findings are similar to prior MRI.  C5-C6: Interval progression of disc bulge with superimposed small right center disc protrusion. Bilateral disc osteophyte ridge/uncinate hypertrophy. Facet hypertrophy. Moderate spinal canal stenosis with contact upon and mild flattening of the ventral spinal cord. Moderate/severe left neural foraminal narrowing also appears progressed from prior.  C6-C7:  Mild uncinate/facet hypertrophy is new from prior exam. No significant disc herniation, spinal canal or neural foraminal narrowing.  C7-T1: No significant disc herniation.No significant spinal canal or neural foraminal narrowing.  IMPRESSION: Motion degraded exam.  Redemonstrated C2-C3 congenital segmentation anomaly.  C5-C6 spondylosis has progressed since prior MRI 11/26/2013. At this level, a disc bulge with superimposed small right center disc protrusion contribute to moderate spinal canal stenosis with mild flattening of the ventral spinal cord. Moderate/severe left neural foraminal narrowing at this level, also progressed.  No more than mild spinal canal stenosis at the remaining levels. Additional sites of neural foraminal narrowing as described, and greatest on the right at C3-C4 (moderate) and on the left at C4-C5 (moderate/severe).   Electronically Signed   By: Kellie Simmering   On:  12/06/2018 09:00  Sunday [place] [name] 54 y.o. male  Interpretation Summary  EMG & NCV Findings: Evaluation of the left median motor nerve showed prolonged distal onset latency (5.8 ms) and decreased conduction velocity (Elbow-Wrist, 46 m/s).  The left median (across palm) sensory nerve showed prolonged distal peak latency (Wrist, 6.1 ms) and prolonged distal peak latency (Palm, 2.3 ms).  The left ulnar sensory nerve showed prolonged distal peak latency (3.9 ms), reduced amplitude (13.5 V), and decreased conduction velocity (Wrist-5th Digit, 36 m/s).  All remaining nerves (as indicated in the following tables) were within normal limits.    Needle evaluation of the left extensor digitorum communis muscle showed increased insertional activity.  The left triceps muscle showed diminished recruitment.  All remaining muscles (as indicated in the following table) showed no evidence of electrical instability.    Impression: The above electrodiagnostic study is ABNORMAL and reveals evidence of:  1.  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   2.  A mild chronic C6 radiculopathy on the left.    There is no significant electrodiagnostic evidence of any other focal nerve entrapment, brachial plexopathy or generalized peripheral neuropathy.   Recommendations: 1.  Follow-up with referring physician. 2.  Continue current management of symptoms. 3.  Suggestuse of resting splint at night-time and as needed during the day. 4.  Suggest surgical evaluation.  Could try diagnostic carpal tunnel injection.  ___________________________ Laurence Spates FAAPMR Board Certified, American Board of Physical Medicine and Rehabilitation   Nerve Conduction Studies Anti Sensory Summary Table   Stim Site NR Peak (ms) Norm Peak (ms) P-T Amp (V) Norm P-T Amp Site1 Site2 Delta-P (ms) Dist (cm) Vel (m/s) Norm Vel  (m/s)  Left Median Acr Palm Anti Sensory (2nd Digit)  31.6C  Wrist    *6.1 <3.6 13.8 >10 Wrist Palm 3.8 0.0    Palm    *2.3 <2.0 7.9         Left Radial Anti Sensory (Base 1st Digit)  31.1C  Wrist    2.6 <3.1 16.0  Wrist Base 1st Digit 2.6 0.0    Left Ulnar Anti Sensory (5th Digit)  31.4C  Wrist    *3.9 <3.7 *13.5 >15.0 Wrist 5th Digit 3.9 14.0 *36 >38   Motor Summary Table   Stim Site NR Onset (ms) Norm Onset (ms) O-P Amp (mV) Norm O-P Amp Site1 Site2 Delta-0 (ms) Dist (cm) Vel (m/s) Norm Vel (m/s)  Left Median Motor (Abd Poll Brev)  31.1C  Wrist    *5.8 <4.2 5.1 >5 Elbow Wrist 5.5 25.5 *46 >50  Elbow    11.3  4.7  Left Ulnar Motor (Abd Dig Min)  31.1C  Wrist    3.4 <4.2 12.1 >3 B Elbow Wrist 4.3 23.0 53 >53  B Elbow    7.7  11.6  A Elbow B Elbow 1.8 10.5 58 >53  A Elbow    9.5  11.9          EMG   Side Muscle Nerve Root Ins Act Fibs Psw Amp Dur Poly Recrt Int Fraser Din Comment  Left 1stDorInt Ulnar C8-T1 Nml Nml Nml Nml Nml 0 Nml Nml   Left Abd Poll Brev Median C8-T1 Nml Nml Nml Nml Nml 0 Nml Nml   Left ExtDigCom   *Incr Nml Nml Nml Nml 0 Nml Nml   Left Triceps Radial C6-7-8 Nml Nml Nml Nml Nml 0 *Reduced Nml   Left Deltoid Axillary C5-6 Nml Nml Nml Nml Nml 0 Nml Nml     Nerve Conduction Studies Anti Sensory Left/Right Comparison   Stim Site L Lat (ms) R Lat (ms) L-R Lat (ms) L Amp (V) R Amp (V) L-R Amp (%) Site1 Site2 L Vel (m/s) R Vel (m/s) L-R Vel (m/s)  Median Acr Palm Anti Sensory (2nd Digit)  31.6C  Wrist *6.1   13.8   Wrist Palm     Palm *2.3   7.9         Radial Anti Sensory (Base 1st Digit)  31.1C  Wrist 2.6   16.0   Wrist Base 1st Digit     Ulnar Anti Sensory (5th Digit)  31.4C  Wrist *3.9   *13.5   Wrist 5th Digit *36     Motor Left/Right Comparison   Stim Site L Lat (ms) R Lat (ms) L-R Lat (ms) L Amp (mV) R Amp (mV) L-R Amp (%) Site1 Site2 L Vel (m/s) R Vel (m/s) L-R Vel  (m/s)  Median Motor (Abd Poll Brev)  31.1C  Wrist *5.8   5.1   Elbow Wrist *46    Elbow 11.3   4.7         Ulnar Motor (Abd Dig Min)  31.1C  Wrist 3.4   12.1   B Elbow Wrist 53    B Elbow 7.7   11.6   A Elbow B Elbow 58    A Elbow 9.5   11.9           Waveforms:              PMFS History: Patient Active Problem List   Diagnosis Date Noted  . Radiculopathy, cervical region 06/10/2019  . Carpal tunnel syndrome, left upper limb 06/10/2019  . Decreased GFR 01/09/2019  . Benzodiazepine withdrawal with complication ([place]) 123XX123  . 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 Spinal stenosis of cervical region 12/31/2018  . Loose body of left knee   . Acute medial meniscus tear of left knee   . Plantar fasciitis of left foot 08/24/2017  . Burning sensation of feet 06/20/2017  . Chronic pain of both hips 06/20/2017  . Angioedema of lips 11/26/2016  . Cognitive complaints with normal neuropsychological exam 10/23/2016  . 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 Hypothyroidism due to acquired atrophy of thyroid 09/14/2015  . Arthritis of knee 06/23/2014  . Encounter for preventive health examination 06/17/2014  . Enlarged RV (right ventricle) 06/17/2014  . GERD (gastroesophageal reflux disease) 04/20/2014  . Cervical spine degeneration 02/17/2014  . Overweight 10/29/2013  . Chronic  knee pain 10/29/2013  . Diabetes mellitus type 2 in obese ([place]) 10/23/2012  . Seborrheic dermatitis, unspecified 05/17/2012  . Fatty liver 04/22/2012  . Hypogonadism male 03/12/2012  . Sciatica of right side 12/24/2010  . Hyperlipidemia associated with type 2 diabetes mellitus ([place]) 01/13/2009  . Anxiety state 01/13/2009  . Essential hypertension 01/13/2009  . OSA (obstructive sleep apnea) 01/13/2009   Past Medical History:  Diagnosis Date    . 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 Anxiety   . Anxiety and depression   . Arthritis    knees and shoulders  . Bruit    L  . Chest pain    hx  . Diabetes mellitus without complication ([place])    "borderline", diet controlled, no meds, patient has lost 30 lbs  . Edema   . Fatty liver   . GERD (gastroesophageal reflux disease)    uses Omeprazole  . Goiter   . HLD (hyperlipidemia)   . Hypertension    essential, benign  . Hypothyroidism   . Impotence of organic origin   . Murmur    never has caused any problems  . Neuromuscular disorder ([place])   . Other chest pain    tightness, pressure  . Palpitation    hx  . Precordial pain   . Sleep apnea    uses cpap    Family History  Problem Relation Age of Onset  . Heart disease Father     Past Surgical History:  Procedure Laterality Date  . APPENDECTOMY  2002   done at [facility]  . CARDIAC CATHETERIZATION    . CARDIAC CATHETERIZATION N/A 08/27/2015   Procedure: Left Heart Cath and Coronary Angiography;  Surgeon: Jolaine Artist, MD;  Location: [facility];  Service: Cardiovascular;  Laterality: N/A;  . COLONOSCOPY    . HERNIA REPAIR  [id number]   umbilical  . JOINT REPLACEMENT     right knee  . KNEE ARTHROSCOPY Left 12/17/2018   Procedure: left knee arthroscopy, meniscal debridement, loose body removal;  Surgeon: Meredith Pel, MD;  Location: [facility];  Service: Orthopedics;  Laterality: Left;  . REVISION TOTAL KNEE ARTHROPLASTY Right 06/23/2014   DR Marlou Sa  . TONSILLECTOMY    . TOTAL KNEE ARTHROPLASTY Right 2011   right  . TOTAL KNEE REVISION Right 06/23/2014   Procedure: TOTAL KNEE REVISION;  Surgeon: Meredith Pel, MD;  Location: [facility];  Service: Orthopedics;  Laterality: Right;  . WISDOM TOOTH EXTRACTION     Social History   Occupational History  . Occupation: IT trainer  . Occupation: SERVICE DIRECTOR    Employer: [employer]   Tobacco Use  . Smoking status: Former Smoker    Packs/day: 2.00    Years: 18.00    Pack years: 36.00    Types: Cigarettes    Quit date: 04/24/2005    Years since quitting: 14.1  . Smokeless tobacco: Never Used  . Tobacco comment: quit 20 years ago   Substance and Sexual Activity  . Alcohol use: Yes    Alcohol/week: 8.0 - 9.0 standard drinks    Types: 6 Cans of beer, 2 - 3 Shots of liquor per week    Comment: beer/liquor  . Drug use: No  . Sexual activity: Yes

## 2019-06-10 NOTE — Telephone Encounter (Signed)
IC pt and advised him I was waiting on authorization from [facility] health and informed him it has been approved and has been sent to [facility] imaging and they should be calling him soon. Pt voiced understanding and will wait on their call. I did also advise him if has not heard anything from imaging by end of week to call them and he stated he would.

## 2019-06-10 NOTE — Telephone Encounter (Signed)
IC s/w pt and advised. 

## 2019-06-14 ENCOUNTER — Ambulatory Visit
Admission: RE | Admit: 2019-06-14 | Discharge: 2019-06-14 | Disposition: A | Discharge status: Home / Self Care | Payer: 59 | Source: Ambulatory Visit | Attending: Orthopedic Surgery | Admitting: Orthopedic Surgery

## 2019-06-14 DIAGNOSIS — M25562 Pain in left knee: Secondary | ICD-10-CM

## 2019-06-19 ENCOUNTER — Other Ambulatory Visit: Payer: Self-pay

## 2019-06-19 ENCOUNTER — Telehealth: Payer: Self-pay | Admitting: Internal Medicine

## 2019-06-19 ENCOUNTER — Ambulatory Visit (INDEPENDENT_AMBULATORY_CARE_PROVIDER_SITE_OTHER): Payer: 59 | Admitting: Orthopedic Surgery

## 2019-06-19 DIAGNOSIS — M7120 Synovial cyst of popliteal space [Baker], unspecified knee: Secondary | ICD-10-CM | POA: Diagnosis not present

## 2019-06-19 NOTE — Telephone Encounter (Signed)
Placed form in the quick sign folder.

## 2019-06-19 NOTE — Telephone Encounter (Signed)
Faxed

## 2019-06-19 NOTE — Telephone Encounter (Signed)
Form completed,  He is cleared

## 2019-06-19 NOTE — Telephone Encounter (Signed)
[name] with Dr Rodell Perna at [facility] states that they need surgical clearance. They received the signature from Dr Derrel Nip but it wasn't checked on the form whether he was cleared. They are going to fax it back. Please fax to them when complete @ [phone]

## 2019-06-20 ENCOUNTER — Encounter: Payer: Self-pay | Admitting: Orthopedic Surgery

## 2019-06-20 NOTE — Progress Notes (Signed)
Office Visit Note   Patient: [name]           Date of Birth: 08/08/1965           MRN: [name] Visit Date: 06/19/2019 Requested by: Joshua Mc, MD [place],  [place] [address] PCP: Joshua Mc, MD  Subjective: Chief Complaint  Patient presents with  . Follow-up    HPI: [name] is a patient here for MRI scan review of his left knee.  Had right total knee revision done recently.  Left knee bothers him with an enlarging Baker's cyst.  MRI scan is reviewed.  He has had arthroscopy on the meniscal tear but does report pain in that posterior knee region.  MRI scan shows Baker cyst is enlarging and is actually expanding in an atypical location anteriorly and proximally.  He has had an aspiration which helped but the pain recurred.  He would like to have that cyst removed.              ROS: All systems reviewed are negative as they relate to the chief complaint within the history of present illness.  Patient denies  fevers or chills.   Assessment & Plan: Visit Diagnoses:  1. Synovial cyst of popliteal space, unspecified laterality     Plan: Impression is left knee symptomatic Baker's cyst which is enlarging.  I think he has a certain amount of arthritis in the knee as well but the cyst is big enough and symptomatic enough above the joint line that I think excision would be indicated.  Would likely do that prone for ease of access to the stalk.  Risk benefits of surgery are discussed including but limited to recurrence incomplete pain relief as well as pain with range of motion of the knee.  He will consider his options and may elect for removal in the future.  Follow-up with me as needed.  Follow-Up Instructions: No follow-ups on file.   Orders:  No orders of the defined types were placed in this encounter.  No orders of the defined types were placed in this encounter.     Procedures: No procedures performed   Clinical Data: No  additional findings.  Objective: Vital Signs: There were no vitals taken for this visit.  Physical Exam:   Constitutional: Patient appears well-developed HEENT:  Head: Normocephalic Eyes:EOM are normal Neck: Normal range of motion Cardiovascular: Normal rate Pulmonary/chest: Effort normal Neurologic: Patient is alert Skin: Skin is warm Psychiatric: Patient has normal mood and affect    Ortho Exam: Ortho exam demonstrates normal gait alignment.  Does have palpable and somewhat tender Baker's cyst posterior medial at the joint line as well as extending proximally around the posterior hamstring and inferior adductor's.  Collateral and cruciate ligaments are stable in the left knee.  Mild effusion is present.  Extensor mechanism is intact.  Specialty Comments:  No specialty comments available.  Imaging: No results found.   PMFS History: Patient Active Problem List   Diagnosis Date Noted  . Radiculopathy, cervical region 06/10/2019  . Carpal tunnel syndrome, left upper limb 06/10/2019  . Decreased GFR 01/09/2019  . Benzodiazepine withdrawal with complication ([place]) 123XX123  . 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 Spinal stenosis of cervical region 12/31/2018  . Loose body of left knee   . Acute medial meniscus tear of left knee   . Plantar fasciitis of left foot 08/24/2017  . Burning sensation of feet 06/20/2017  . Chronic pain of  both hips 06/20/2017  . Angioedema of lips 11/26/2016  . Cognitive complaints with normal neuropsychological exam 10/23/2016  . 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 Hypothyroidism due to acquired atrophy of thyroid 09/14/2015  . Arthritis of knee 06/23/2014  . Encounter for preventive health examination 06/17/2014  . Enlarged RV (right ventricle) 06/17/2014  . GERD (gastroesophageal reflux disease) 04/20/2014  . Cervical  spine degeneration 02/17/2014  . Overweight 10/29/2013  . Chronic knee pain 10/29/2013  . Diabetes mellitus type 2 in obese ([place]) 10/23/2012  . Seborrheic dermatitis, unspecified 05/17/2012  . Fatty liver 04/22/2012  . Hypogonadism male 03/12/2012  . Sciatica of right side 12/24/2010  . Hyperlipidemia associated with type 2 diabetes mellitus ([place]) 01/13/2009  . Anxiety state 01/13/2009  . Essential hypertension 01/13/2009  . OSA (obstructive sleep apnea) 01/13/2009   Past Medical History:  Diagnosis Date  . 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 Anxiety   . Anxiety and depression   . Arthritis    knees and shoulders  . Bruit    L  . Chest pain    hx  . Diabetes mellitus without complication ([place])    "borderline", diet controlled, no meds, patient has lost 30 lbs  . Edema   . Fatty liver   . GERD (gastroesophageal reflux disease)    uses Omeprazole  . Goiter   . HLD (hyperlipidemia)   . Hypertension    essential, benign  . Hypothyroidism   . Impotence of organic origin   . Murmur    never has caused any problems  . Neuromuscular disorder ([place])   . Other chest pain    tightness, pressure  . Palpitation    hx  . Precordial pain   . Sleep apnea    uses cpap    Family History  Problem Relation Age of Onset  . Heart disease Father     Past Surgical History:  Procedure Laterality Date  . APPENDECTOMY  2002   done at [facility]  . CARDIAC CATHETERIZATION    . CARDIAC CATHETERIZATION N/A 08/27/2015   Procedure: Left Heart Cath and Coronary Angiography;  Surgeon: Jolaine Artist, MD;  Location: [facility];  Service: Cardiovascular;  Laterality: N/A;  . COLONOSCOPY    . HERNIA REPAIR  [id number]   umbilical  . JOINT REPLACEMENT     right knee  . KNEE ARTHROSCOPY Left 12/17/2018   Procedure: left knee arthroscopy, meniscal debridement, loose body removal;  Surgeon: Meredith Pel, MD;  Location: [facility];  Service: Orthopedics;  Laterality: Left;    . REVISION TOTAL KNEE ARTHROPLASTY Right 06/23/2014   DR Marlou Sa  . TONSILLECTOMY    . TOTAL KNEE ARTHROPLASTY Right 2011   right  . TOTAL KNEE REVISION Right 06/23/2014   Procedure: TOTAL KNEE REVISION;  Surgeon: Meredith Pel, MD;  Location: [facility];  Service: Orthopedics;  Laterality: Right;  . WISDOM TOOTH EXTRACTION     Social History   Occupational History  . Occupation: IT trainer  . Occupation: SERVICE DIRECTOR    Employer: [employer]  Tobacco Use  . Smoking status: Former Smoker    Packs/day: 2.00    Years: 18.00    Pack years: 36.00    Types: Cigarettes    Quit date: 04/24/2005    Years since quitting: 14.1  . Smokeless tobacco:  Never Used  . Tobacco comment: quit 20 years ago   Substance and Sexual Activity  . Alcohol use: Yes    Alcohol/week: 8.0 - 9.0 standard drinks    Types: 6 Cans of beer, 2 - 3 Shots of liquor per week    Comment: beer/liquor  . Drug use: No  . Sexual activity: Yes

## 2019-06-23 ENCOUNTER — Ambulatory Visit (INDEPENDENT_AMBULATORY_CARE_PROVIDER_SITE_OTHER): Payer: 59 | Admitting: Licensed Clinical Social Worker

## 2019-06-23 ENCOUNTER — Other Ambulatory Visit: Payer: Self-pay

## 2019-06-23 DIAGNOSIS — F411 Generalized anxiety disorder: Secondary | ICD-10-CM | POA: Diagnosis not present

## 2019-06-23 DIAGNOSIS — F0632 Mood disorder due to known physiological condition with major depressive-like episode: Secondary | ICD-10-CM | POA: Diagnosis not present

## 2019-06-23 NOTE — Progress Notes (Signed)
Virtual Visit via Telephone Note  I connected with [name] on 06/23/19 at 10:00 AM EST by telephone and verified that I am speaking with the correct person using two identifiers.   I discussed the limitations, risks, security and privacy concerns of performing an evaluation and management service by telephone and the availability of in person appointments. I also discussed with the patient that there may be a patient responsible charge related to this service. The patient expressed understanding and agreed to proceed.   I discussed the assessment and treatment plan with the patient. The patient was provided an opportunity to ask questions and all were answered. The patient agreed with the plan and demonstrated an understanding of the instructions.   The patient was advised to call back or seek an in-person evaluation if the symptoms worsen or if the condition fails to improve as anticipated.  I provided 30 minutes of non-face-to-face time during this encounter.   Renee Harder, LCSW      THERAPIST PROGRESS NOTE  Session Time: 10am-10:30am  Participation Level: Active  Behavioral Response: NAAlertDepressed  Type of Therapy: Individual Therapy  Treatment Goals addressed: Coping, Anxiety  Interventions: Motivational Interviewing, solution focused, strength based  Summary: Client presented alert and oriented x5 for today's session via telephone. Client checked in and reported continuing to feel overwhelmed by stressors related to finances, health concerns, and his pending disability application. Client was receptive to feedback and agreed to utilize skills discussed during this session prior to our next meeting. Client identified specific supports however denied reaching out to them or informing them of his current stressors and reported they "are in the dark". Client agreed that he does not appropriately utilize supports. Client was receptive to information related to potential  services that he and his wife may qualify for and agrees to contact the department of social services to inquire about potential benefits that may alleviate financial stress. Client is looking forward to seeing his family this weekend and identified his wife and children as things that he is grateful for. Client denied current SI/HI, psychosis.  Suicidal/Homicidal: Nowithout intent/plan  Therapist Response: Clinician joined client for session and assessed mood, current stressors, and SI/HI/psychosis. Client assessed client's current coping strategies as well as strategies that may have been helpful in the past. Clinician encouraged client to decrease focus on situations that are out of his control and instead shift his thoughts to situations that he can do in the moment to alleviate stress. Clinician praised client's willingness to try new skills. Clinician validated client's feelings and encouraged him to reach out to identified supports within his comfort level. Clinician provided information for resources within client's county for potential services that he may qualify for. Clinician agreed to see client again on 07/22/19 at 1pm.  Plan: Return again in 3-4 weeks.  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

## 2019-06-27 ENCOUNTER — Other Ambulatory Visit: Payer: Self-pay | Admitting: Surgical

## 2019-06-27 NOTE — Telephone Encounter (Signed)
Ok to rf? 

## 2019-06-30 ENCOUNTER — Telehealth: Payer: Self-pay | Admitting: Orthopedic Surgery

## 2019-06-30 NOTE — Telephone Encounter (Signed)
Please complete blue sheet. Thanks.

## 2019-06-30 NOTE — Telephone Encounter (Signed)
Patient would like to proceed with having cyst removed from left knee.  He has [employer], therefore the case with need to be scheduled at either [facility] or [facility].  Please provide surgery sheet when possible.    Patient would also like to know if he will be on crutches after the surgery, and if so how long.

## 2019-06-30 NOTE — Telephone Encounter (Signed)
Done thx

## 2019-07-01 ENCOUNTER — Other Ambulatory Visit: Payer: Self-pay

## 2019-07-01 NOTE — Telephone Encounter (Signed)
Please advise on crutches.

## 2019-07-01 NOTE — Telephone Encounter (Signed)
Patient is scheduled at [facility] on 07-08-19 for open excision baker's cyst left knee with Dr. Marlou Sa.  Patient would like to know if he will need crutches after the surgery. If the answer is yes, he would like to know how long.   Pt's cb  [phone]

## 2019-07-01 NOTE — Telephone Encounter (Signed)
Blue sheet complete

## 2019-07-01 NOTE — Telephone Encounter (Signed)
Yes 3 w

## 2019-07-01 NOTE — Telephone Encounter (Signed)
I called and spoke with patient. He thought that you all discussed he would be weightbearing. He wanted me to check and be sure you felt he would have to have the crutches. If so, he will need to postpone surgery because he is potty training a puppy right now and does not feel he will be able to do that on crutches.   Please advise.

## 2019-07-02 NOTE — Telephone Encounter (Signed)
FYI  I called patient and advised. He will continue with surgery next week.

## 2019-07-02 NOTE — Telephone Encounter (Signed)
He will be weightbearing as tolerated using the crutches as needed.  He will be able to get off the crutches as soon as he feels able to.  I think that in relation to the knee arthroscopy most people are on crutches weightbearing as tolerated with that for 3 or 4 days.  With this surgery I think it would be more like a week where he would feel like he would need them in order to have enough balance to get around but he does not have to have them.  Please call thanks

## 2019-07-03 ENCOUNTER — Other Ambulatory Visit: Payer: Self-pay

## 2019-07-03 ENCOUNTER — Ambulatory Visit (INDEPENDENT_AMBULATORY_CARE_PROVIDER_SITE_OTHER): Payer: 59 | Admitting: Psychiatry

## 2019-07-03 ENCOUNTER — Encounter (HOSPITAL_COMMUNITY): Payer: Self-pay | Admitting: Psychiatry

## 2019-07-03 DIAGNOSIS — F331 Major depressive disorder, recurrent, moderate: Secondary | ICD-10-CM | POA: Diagnosis not present

## 2019-07-03 DIAGNOSIS — F411 Generalized anxiety disorder: Secondary | ICD-10-CM

## 2019-07-03 MED ORDER — BUSPIRONE HCL 15 MG PO TABS: 15.0000 mg | ORAL_TABLET | Freq: Two times a day (BID) | ORAL | 1 refills | Status: DC

## 2019-07-03 MED ORDER — TRAZODONE HCL 100 MG PO TABS: 100.0000 mg | ORAL_TABLET | Freq: Every day | ORAL | 1 refills | Status: DC

## 2019-07-03 MED ORDER — DULOXETINE HCL 30 MG PO CPEP: ORAL_CAPSULE | ORAL | 0 refills | Status: DC

## 2019-07-03 MED ORDER — ARIPIPRAZOLE 5 MG PO TABS: ORAL_TABLET | ORAL | 1 refills | Status: DC

## 2019-07-03 MED ORDER — BUPROPION HCL ER (XL) 300 MG PO TB24: 300.0000 mg | ORAL_TABLET | Freq: Every day | ORAL | 1 refills | Status: DC

## 2019-07-03 NOTE — Progress Notes (Signed)
Virtual Visit via Telephone Note  I connected with [name] on 07/03/19 at 10:40 AM EST by telephone and verified that I am speaking with the correct person using two identifiers.   I discussed the limitations, risks, security and privacy concerns of performing an evaluation and management service by telephone and the availability of in person appointments. I also discussed with the patient that there may be a patient responsible charge related to this service. The patient expressed understanding and agreed to proceed.   History of Present Illness: Patient was evaluated by phone session. On the last visit we started him on Wellbutrin. He is doing better with the Wellbutrin. He has more energy and he is more motivated to do things. However he still have ruminative thoughts and sometimes having crying spells. He is sad because he got denied from Social Security disability and he is now in the process of appeal. He is a still taking Cymbalta, trazodone, BuSpar, Abilify and PCP prescribed Klonopin. He reported he is a sole taker of his wife who has multiple health issues. He started therapy and he like talking to someone about his issues. So far he is tolerating his medication and reported no tremors, shakes or any EPS. He sleeps better with the trazodone. He has chronic pain and recently seen by orthopedic for knee joint pain. He is no longer taking narcotic pain medication and he may need to see a pain management. Denies drinking or using any illegal substances.    Past Psychiatric History:Reviewed. H/Odepression and anxiety. No h/osuicidal attempt or inpatient treatment. Seen Dr. Jake Michaelis and given Zoloft, Klonopin and Cymbalta. Later PCP added BuSpar and trazodone.We tried Lamictal but did not help.   Psychiatric Specialty Exam: Physical Exam  Review of Systems  There were no vitals taken for this visit.There is no height or weight on file to calculate BMI.  General Appearance: NA   Eye Contact:  NA  Speech:  Slow  Volume:  Decreased  Mood:  Dysphoric  Affect:  NA  Thought Process:  Descriptions of Associations: Intact  Orientation:  Full (Time, Place, and Person)  Thought Content:  Rumination  Suicidal Thoughts:  No  Homicidal Thoughts:  No  Memory:  Immediate;   Good Recent;   Fair Remote;   Fair  Judgement:  Intact  Insight:  Present  Psychomotor Activity:  NA  Concentration:  Concentration: Fair and Attention Span: Fair  Recall:  [employer] of Knowledge:  Good  Language:  Good  Akathisia:  No  Handed:  Right  AIMS (if indicated):     Assets:  Communication Skills Desire for Improvement Housing Resilience  ADL's:  Intact  Cognition:  WNL  Sleep:   fair      Assessment and Plan: Major depressive disorder, recurrent. Generalized anxiety disorder.  Patient seems to be doing a little better with Wellbutrin. I recommend to try Wellbutrin 300 mg to help his residual depression. So far he is tolerating and reported no tremors shakes. I recommend to cut down Cymbalta to take 30 mg every other day for 2 weeks and then stop. We discussed possible withdrawal from Cymbalta. He is taking BuSpar, Klonopin, trazodone and now Wellbutrin. Discussed serotonin syndrome with multiple antidepressant. Continue Abilify 5 mg daily, continue BuSpar 15 mg twice a day and trazodone 100 mg at bedtime. We will eventually taken off from Cymbalta and if needed consider going up the dose of BuSpar. We discussed he is getting Klonopin from PCP and long-term  goal is to come off from benzodiazepine and if needed we can adjust the dose of BuSpar. Encouraged to keep appointment with therapy. Recommended to call us back if he has any question of any concern. Follow-up in 4 weeks.  Follow Up Instructions:    I discussed the assessment and treatment plan with the patient. The patient was provided an opportunity to ask questions and all were answered. The patient agreed with the plan and  demonstrated an understanding of the instructions.   The patient was advised to call back or seek an in-person evaluation if the symptoms worsen or if the condition fails to improve as anticipated.  I provided 20 minutes of non-face-to-face time during this encounter.   Kathlee Nations, MD

## 2019-07-04 ENCOUNTER — Encounter (HOSPITAL_COMMUNITY): Payer: Self-pay

## 2019-07-04 ENCOUNTER — Encounter (HOSPITAL_COMMUNITY)
Admission: RE | Admit: 2019-07-04 | Discharge: 2019-07-04 | Disposition: A | Discharge status: Home / Self Care | Payer: 59 | Source: Ambulatory Visit | Attending: Orthopedic Surgery | Admitting: Orthopedic Surgery

## 2019-07-04 ENCOUNTER — Telehealth: Payer: Self-pay | Admitting: Orthopedic Surgery

## 2019-07-04 ENCOUNTER — Other Ambulatory Visit (HOSPITAL_COMMUNITY)
Admission: RE | Admit: 2019-07-04 | Discharge: 2019-07-04 | Disposition: A | Discharge status: Home / Self Care | Payer: 59 | Source: Ambulatory Visit | Attending: Orthopedic Surgery | Admitting: Orthopedic Surgery

## 2019-07-04 ENCOUNTER — Other Ambulatory Visit: Payer: Self-pay | Admitting: Surgical

## 2019-07-04 ENCOUNTER — Other Ambulatory Visit: Payer: Self-pay

## 2019-07-04 DIAGNOSIS — Z01812 Encounter for preprocedural laboratory examination: Secondary | ICD-10-CM | POA: Insufficient documentation

## 2019-07-04 DIAGNOSIS — M7122 Synovial cyst of popliteal space [Baker], left knee: Secondary | ICD-10-CM | POA: Insufficient documentation

## 2019-07-04 DIAGNOSIS — Z20822 Contact with and (suspected) exposure to covid-19: Secondary | ICD-10-CM | POA: Diagnosis not present

## 2019-07-04 LAB — BASIC METABOLIC PANEL
Anion gap: 10 (ref 5–15)
BUN: 18 mg/dL (ref 6–20)
CO2: 28 mmol/L (ref 22–32)
Calcium: 9 mg/dL (ref 8.9–10.3)
Chloride: 99 mmol/L (ref 98–111)
Creatinine, Ser: 1.03 mg/dL (ref 0.61–1.24)
GFR calc Af Amer: >60 mL/min (ref >60)
GFR calc non Af Amer: >60 mL/min (ref >60)
Glucose, Bld: 141 mg/dL — ABNORMAL HIGH (ref 70–99)
Potassium: 4 mmol/L (ref 3.5–5.1)
Sodium: 137 mmol/L (ref 135–145)

## 2019-07-04 LAB — CBC
HCT: 45 % (ref 39.0–52.0)
Hemoglobin: 15 g/dL (ref 13.0–17.0)
MCH: 31.6 pg (ref 26.0–34.0)
MCHC: 33.3 g/dL (ref 30.0–36.0)
MCV: 94.9 fL (ref 80.0–100.0)
Platelets: 256 10*3/uL (ref 150–400)
RBC: 4.74 MIL/uL (ref 4.22–5.81)
RDW: 12.6 % (ref 11.5–15.5)
WBC: 6.1 10*3/uL (ref 4.0–10.5)
nRBC: 0 % (ref 0.0–0.2)

## 2019-07-04 LAB — HEMOGLOBIN A1C
Hgb A1c MFr Bld: 6.7 % — ABNORMAL HIGH (ref 4.8–5.6)
Mean Plasma Glucose: 145.59 mg/dL

## 2019-07-04 LAB — GLUCOSE, CAPILLARY: Glucose-Capillary: 134 mg/dL — ABNORMAL HIGH (ref 70–99)

## 2019-07-04 LAB — SARS CORONAVIRUS 2 (TAT 6-24 HRS): SARS Coronavirus 2: NEGATIVE

## 2019-07-04 MED ORDER — METHOCARBAMOL 500 MG PO TABS: 500.0000 mg | ORAL_TABLET | Freq: Three times a day (TID) | ORAL | 0 refills | Status: DC | PRN

## 2019-07-04 MED ORDER — ASPIRIN EC 81 MG PO TBEC: 81.0000 mg | DELAYED_RELEASE_TABLET | Freq: Every day | ORAL | 0 refills | Status: DC

## 2019-07-04 MED ORDER — OXYCODONE HCL 5 MG PO TABS: 5.0000 mg | ORAL_TABLET | ORAL | 0 refills | Status: DC | PRN

## 2019-07-04 NOTE — Telephone Encounter (Signed)
IC s/w patient and advised. I also advised this is for his post op medication He verbalized understanding

## 2019-07-04 NOTE — Progress Notes (Signed)
PCP - DR Sherrlyn Hock medical clearance Cardiologist - Dr Haroldine Laws      Chest x-ray - na EKG - 8/20 Stress Test - na ECHO - 6/12 Cardiac Cath - 5/17  Sleep Study - yes CPAP - yes  Fasting Blood Sugar - ? Checks Blood Sugar __0___ times a day  Blood Thinner Instructions:stop Aspirin Instructions:stop  ERAS Protcol -yes PRE-SURGERY Ensure or G2- water given    Pre diabetic  COVID TEST- for today   Anesthesia review: heart history  Patient denies shortness of breath, fever, cough and chest pain at PAT appointment   All instructions explained to the patient, with a verbal understanding of the material. Patient agrees to go over the instructions while at home for a better understanding. Patient also instructed to self quarantine after being tested for COVID-19. The opportunity to ask questions was provided.

## 2019-07-04 NOTE — Pre-Procedure Instructions (Signed)
[name]  07/04/2019      [facility] [address], [place] - [id number] [place] [id number] Phone: [phone] Fax: [phone]    Your procedure is scheduled on 07/08/19.  Report to [facility] Admitting at 925 A.M.  Call this number if you have problems the morning of surgery:  [phone]   Remember:  Do not eat or drink after midnight.  You may drink clear liquids until 825 AM .  Clear liquids allowed are:                    Water, Juice (non-citric and without pulp), Carbonated beverages, Clear Tea, Black Coffee only and Plain Jell-O only Please complete your PRE-SURGERY ENSURE that was provided to you by ... the morning of surgery.  Please, if able, drink it in one setting. DO NOT SIP.    Take these medicines the morning of surgery with A SIP OF WATER ---  Buspar,klonopin,cymbalta,neurontin,lopressor,ultram,AMBILIFY    Do not wear jewelry, make-up or nail polish.  Do not wear lotions, powders, or perfumes, or deodorant.  Do not shave 48 hours prior to surgery.  Men may shave face and neck.  Do not bring valuables to the hospital.  [facility] is not responsible for any belongings or valuables. Do not take any aspirin,anti-inflammatories,vitamins,or herbal supplements 5-7 days prior to surgery. Contacts, dentures or bridgework may not be worn into surgery.  Leave your suitcase in the car.  After surgery it may be brought to your room.  For patients admitted to the hospital, discharge time will be determined by your treatment team.  Patients discharged the day of surgery will not be allowed to drive home.   Special instructions:  [place] - Preparing for Surgery  Before surgery, you can play an important role.  Because skin is not sterile, your skin needs to be as free of germs as possible.  You can reduce the number of germs on you skin by washing with CHG (chlorahexidine gluconate) soap before surgery.  CHG  is an antiseptic cleaner which kills germs and bonds with the skin to continue killing germs even after washing.  Oral Hygiene is also important in reducing the risk of infection.  Remember to brush your teeth with your regular toothpaste the morning of surgery.  Please DO NOT use if you have an allergy to CHG or antibacterial soaps.  If your skin becomes reddened/irritated stop using the CHG and inform your nurse when you arrive at Short Stay.  Do not shave (including legs and underarms) for at least 48 hours prior to the first CHG shower.  You may shave your face.  Please follow these instructions carefully:   1.  Shower with CHG Soap the night before surgery and the morning of Surgery.  2.  If you choose to wash your hair, wash your hair first as usual with your normal shampoo.  3.  After you shampoo, rinse your hair and body thoroughly to remove the shampoo. 4.  Use CHG as you would any other liquid soap.  You can apply chg directly to the skin and wash gently with a      scrungie or washcloth.           5.  Apply the CHG Soap to your body ONLY FROM THE NECK DOWN.   Do not use on open wounds or open sores. Avoid contact with your eyes, ears, mouth  and genitals (private parts).  Wash genitals (private parts) with your normal soap.  6.  Wash thoroughly, paying special attention to the area where your surgery will be performed.  7.  Thoroughly rinse your body with warm water from the neck down.  8.  DO NOT shower/wash with your normal soap after using and rinsing off the CHG Soap.  9.  Pat yourself dry with a clean towel.            10.  Wear clean pajamas.            11.  Place clean sheets on your bed the night of your first shower and do not sleep with pets.  Day of Surgery  Do not apply any lotions/deoderants the morning of surgery.   Please wear clean clothes to the hospital/surgery center. Remember to brush your teeth with toothpaste.    Please read over the following fact sheets  that you were given.

## 2019-07-04 NOTE — Telephone Encounter (Signed)
Please advise. Thanks.  

## 2019-07-04 NOTE — Telephone Encounter (Signed)
Patient called and stated he is having surgery on 07/08/19 and wants to know if Dr Marlou Sa can call in his medications for after the surgery in on today or 3/15 so he can pick them up before surgery.  Please call patient [phone]

## 2019-07-07 NOTE — Progress Notes (Signed)
Anesthesia Chart Review:  Follows with cardiology for history of HTN, HLD, and chest pain. In 2017 he was evaluated by Dr. Haroldine Laws for chest pain and ultimately underwent cath on 5/17 with normal cors except for LCX 30%. Medical therapy was recommended. More recently he was seen at the ED at [facility] on 01/31/19 for eval of chest pain. ED workup was benign. EKG showed tachycardia with chronic RBBB. . CT PE study obtained without evidence of acute PE or other findings. Troponin negative x 2. Chest pain resolved during ED workup. Overnight observation with stress test was recommended, however pt stated he needed to return home to are for wife who has LVAD. It was discussed that he would followup with Dr. Haroldine Laws outpatient. He was seen by Dr. Haroldine Laws on 02/04/19. At that visit he reported no further chest pain. Coronary CTA was ordered which showed only mild CAD. Dr. Haroldine Laws commented on result stating "Very mild CAD. Ok to have surgery. Please let him know." Clearance at that time was for a podiatric surgery which he underwent on 02/17/19.  Pt also has clearance from PCP Dr. Derrel Nip dated 06/10/19. This clearance was originally for ACDF and carpal tunnel release, however that has been rescheduled and pt is now scheduled for excision of baker's cyst.   DMII well controlled A1c 6.7.  Preop labs reviewed, unremarkable.   EKG 12/17/18: Normal sinus rhythm. Rate 76. Right bundle branch block. Possible Anterior infarct , age undetermined. No significant change since last tracing  Coronary CTA 02/17/19: IMPRESSION: 1. Coronary artery calcium score 50 Agatston units. This places the patient in the 82nd percentile for age and gender, suggesting high risk for future cardiac events.  2.  Nonobstructive disease in the LAD and RCA systems.  3. Misregistration artifact makes interpretation of the mid LCx difficult. I suspect mild stenosis in the mid LCx. Due to misregistration, I do not think that FFR would  be successful on this study.  Cath 08/27/15:  Mid Cx to Dist Cx lesion, 30% stenosed.   Assessment & Plan:  1. Minimal CAD with 30% mid LCX lesion.   Plan Medical therapy.   [name] [facility] Short Stay Center/Anesthesiology Phone [phone] 07/07/2019 10:07 AM

## 2019-07-07 NOTE — Anesthesia Preprocedure Evaluation (Addendum)
Anesthesia Evaluation  Patient identified by MRN, date of birth, ID band Patient awake    Reviewed: Allergy & Precautions, NPO status , Patient's Chart, lab work & pertinent test results  Airway Mallampati: II  TM Distance: >3 FB Neck ROM: Full    Dental no notable dental hx.    Pulmonary sleep apnea and Continuous Positive Airway Pressure Ventilation , former smoker,    Pulmonary exam normal breath sounds clear to auscultation       Cardiovascular hypertension, Pt. on medications and Pt. on home beta blockers Normal cardiovascular exam Rhythm:Regular Rate:Normal  ECG: rate 76. Normal sinus rhythm Right bundle branch block   Neuro/Psych PSYCHIATRIC DISORDERS Anxiety Depression negative neurological ROS     GI/Hepatic Neg liver ROS, GERD  Medicated and Controlled,  Endo/Other  diabetesHypothyroidism   Renal/GU negative Renal ROS     Musculoskeletal  (+) Arthritis ,   Abdominal (+) + obese,   Peds  Hematology negative hematology ROS (+)   Anesthesia Other Findings left knee baker's cyst  Reproductive/Obstetrics                          Anesthesia Physical Anesthesia Plan  ASA: III  Anesthesia Plan: General   Post-op Pain Management:    Induction: Intravenous  PONV Risk Score and Plan: 2 and Ondansetron, Dexamethasone, Midazolam and Treatment may vary due to age or medical condition  Airway Management Planned: Oral ETT and Video Laryngoscope Planned  Additional Equipment:   Intra-op Plan:   Post-operative Plan: Extubation in OR  Informed Consent: I have reviewed the patients History and Physical, chart, labs and discussed the procedure including the risks, benefits and alternatives for the proposed anesthesia with the patient or authorized representative who has indicated his/her understanding and acceptance.     Dental advisory given  Plan Discussed with: CRNA  Anesthesia  Plan Comments: (Per PA-C: Follows with cardiology for history of HTN, HLD, and chest pain. In 2017 he was evaluated by Dr. Haroldine Laws for chest pain and ultimately underwent cath on 5/17 with normal cors except for LCX 30%. Medical therapy was recommended. More recently he was seen at the ED at [facility] on 01/31/19 for eval of chest pain. ED workup was benign. EKG showed tachycardia with chronic RBBB. . CT PE study obtained without evidence of acute PE or other findings. Troponin negative x 2. Chest pain resolved during ED workup. Overnight observation with stress test was recommended, however pt stated he needed to return home to are for wife who has LVAD. It was discussed that he would followup with Dr. Haroldine Laws outpatient. He was seen by Dr. Haroldine Laws on 02/04/19. At that visit he reported no further chest pain. Coronary CTA was ordered which showed only mild CAD. Dr. Haroldine Laws commented on result stating "Very mild CAD. Ok to have surgery. Please let him know." Clearance at that time was for a podiatric surgery which he underwent on 02/17/19.  Pt also has clearance from PCP Dr. Derrel Nip dated 06/10/19. This clearance was originally for ACDF and carpal tunnel release, however that has been rescheduled and pt is now scheduled for excision of baker's cyst.   DMII well controlled A1c 6.7.  Preop labs reviewed, unremarkable.   EKG 12/17/18: Normal sinus rhythm. Rate 76. Right bundle branch block. Possible Anterior infarct , age undetermined. No significant change since last tracing  Coronary CTA 02/17/19: IMPRESSION: 1. Coronary artery calcium score 50 Agatston units. This places the patient in the  82nd percentile for age and gender, suggesting high risk for future cardiac events.  2.  Nonobstructive disease in the LAD and RCA systems.  3. Misregistration artifact makes interpretation of the mid LCx difficult. I suspect mild stenosis in the mid LCx. Due to misregistration, I do not think that FFR would be  successful on this study.  Cath 08/27/15: Mid Cx to Dist Cx lesion, 30% stenosed.   Assessment & Plan:  1. Minimal CAD with 30% mid LCX lesion.   Plan Medical therapy.  )     Anesthesia Quick Evaluation

## 2019-07-08 ENCOUNTER — Ambulatory Visit (HOSPITAL_COMMUNITY): Payer: 59 | Admitting: Certified Registered"

## 2019-07-08 ENCOUNTER — Encounter (HOSPITAL_COMMUNITY)
Admission: RE | Disposition: A | Discharge status: Home / Self Care | Payer: Self-pay | Source: Home / Self Care | Attending: Orthopedic Surgery

## 2019-07-08 ENCOUNTER — Encounter (HOSPITAL_COMMUNITY): Payer: Self-pay | Admitting: Orthopedic Surgery

## 2019-07-08 ENCOUNTER — Ambulatory Visit (HOSPITAL_COMMUNITY): Payer: 59 | Admitting: Physician Assistant

## 2019-07-08 ENCOUNTER — Ambulatory Visit (HOSPITAL_COMMUNITY)
Admission: RE | Admit: 2019-07-08 | Discharge: 2019-07-08 | Disposition: A | Discharge status: Home / Self Care | Payer: 59 | Attending: Orthopedic Surgery | Admitting: Orthopedic Surgery

## 2019-07-08 ENCOUNTER — Other Ambulatory Visit: Payer: Self-pay

## 2019-07-08 DIAGNOSIS — K219 Gastro-esophageal reflux disease without esophagitis: Secondary | ICD-10-CM | POA: Insufficient documentation

## 2019-07-08 DIAGNOSIS — F419 Anxiety disorder, unspecified: Secondary | ICD-10-CM | POA: Insufficient documentation

## 2019-07-08 DIAGNOSIS — I10 Essential (primary) hypertension: Secondary | ICD-10-CM | POA: Diagnosis not present

## 2019-07-08 DIAGNOSIS — E119 Type 2 diabetes mellitus without complications: Secondary | ICD-10-CM | POA: Insufficient documentation

## 2019-07-08 DIAGNOSIS — F329 Major depressive disorder, single episode, unspecified: Secondary | ICD-10-CM | POA: Insufficient documentation

## 2019-07-08 DIAGNOSIS — K760 Fatty (change of) liver, not elsewhere classified: Secondary | ICD-10-CM | POA: Insufficient documentation

## 2019-07-08 DIAGNOSIS — Z7982 Long term (current) use of aspirin: Secondary | ICD-10-CM | POA: Diagnosis not present

## 2019-07-08 DIAGNOSIS — E785 Hyperlipidemia, unspecified: Secondary | ICD-10-CM | POA: Insufficient documentation

## 2019-07-08 DIAGNOSIS — F17210 Nicotine dependence, cigarettes, uncomplicated: Secondary | ICD-10-CM | POA: Insufficient documentation

## 2019-07-08 DIAGNOSIS — M7122 Synovial cyst of popliteal space [Baker], left knee: Secondary | ICD-10-CM | POA: Diagnosis present

## 2019-07-08 DIAGNOSIS — E039 Hypothyroidism, unspecified: Secondary | ICD-10-CM | POA: Insufficient documentation

## 2019-07-08 DIAGNOSIS — Z791 Long term (current) use of non-steroidal anti-inflammatories (NSAID): Secondary | ICD-10-CM | POA: Insufficient documentation

## 2019-07-08 DIAGNOSIS — M1990 Unspecified osteoarthritis, unspecified site: Secondary | ICD-10-CM | POA: Diagnosis not present

## 2019-07-08 DIAGNOSIS — Z79899 Other long term (current) drug therapy: Secondary | ICD-10-CM | POA: Diagnosis not present

## 2019-07-08 HISTORY — PX: EAR CYST EXCISION: SHX22

## 2019-07-08 LAB — GLUCOSE, CAPILLARY
Glucose-Capillary: 141 mg/dL — ABNORMAL HIGH (ref 70–99)
Glucose-Capillary: 117 mg/dL — ABNORMAL HIGH (ref 70–99)

## 2019-07-08 SURGERY — EXCISION, SYNOVIAL CYST, POPLITEAL SPACE
Anesthesia: General | Laterality: Left

## 2019-07-08 MED ORDER — KETOROLAC TROMETHAMINE 30 MG/ML IJ SOLN
INTRAMUSCULAR | Status: AC
  Filled 2019-07-08: qty 1

## 2019-07-08 MED ORDER — ACETAMINOPHEN 500 MG PO TABS: 1000.0000 mg | ORAL_TABLET | Freq: Once | ORAL | Status: DC

## 2019-07-08 MED ORDER — FENTANYL CITRATE (PF) 250 MCG/5ML IJ SOLN
INTRAMUSCULAR | Status: AC
  Filled 2019-07-08: qty 5

## 2019-07-08 MED ORDER — LACTATED RINGERS IV SOLN: INTRAVENOUS | Status: DC | PRN

## 2019-07-08 MED ORDER — KETOROLAC TROMETHAMINE 30 MG/ML IJ SOLN
30.0000 mg | Freq: Once | INTRAMUSCULAR | Status: AC
  Administered 2019-07-08: 30 mg via INTRAVENOUS

## 2019-07-08 MED ORDER — MIDAZOLAM HCL 2 MG/2ML IJ SOLN
INTRAMUSCULAR | Status: AC
  Filled 2019-07-08: qty 2

## 2019-07-08 MED ORDER — MORPHINE SULFATE (PF) 4 MG/ML IV SOLN
INTRAVENOUS | Status: DC | PRN
  Administered 2019-07-08 (×2): 4 mg

## 2019-07-08 MED ORDER — GLYCOPYRROLATE PF 0.2 MG/ML IJ SOSY
PREFILLED_SYRINGE | INTRAMUSCULAR | Status: DC | PRN
  Administered 2019-07-08: .1 mg via INTRAVENOUS

## 2019-07-08 MED ORDER — ONDANSETRON HCL 4 MG/2ML IJ SOLN
INTRAMUSCULAR | Status: DC | PRN
  Administered 2019-07-08: 4 mg via INTRAVENOUS

## 2019-07-08 MED ORDER — CHLORHEXIDINE GLUCONATE 4 % EX LIQD: 60.0000 mL | Freq: Once | CUTANEOUS | Status: DC

## 2019-07-08 MED ORDER — PROMETHAZINE HCL 25 MG/ML IJ SOLN
6.2500 mg | INTRAMUSCULAR | Status: DC | PRN
  Administered 2019-07-08: 6.25 mg via INTRAVENOUS

## 2019-07-08 MED ORDER — HYDROMORPHONE HCL 1 MG/ML IJ SOLN
INTRAMUSCULAR | Status: AC
  Filled 2019-07-08: qty 1

## 2019-07-08 MED ORDER — ACETAMINOPHEN 10 MG/ML IV SOLN
1000.0000 mg | Freq: Once | INTRAVENOUS | Status: DC | PRN
  Administered 2019-07-08: 1000 mg via INTRAVENOUS

## 2019-07-08 MED ORDER — CLONIDINE HCL (ANALGESIA) 100 MCG/ML EP SOLN
EPIDURAL | Status: DC | PRN
  Administered 2019-07-08: 100 ug

## 2019-07-08 MED ORDER — OXYCODONE HCL 5 MG PO TABS
ORAL_TABLET | ORAL | Status: AC
  Filled 2019-07-08: qty 1

## 2019-07-08 MED ORDER — ONDANSETRON HCL 4 MG/2ML IJ SOLN
INTRAMUSCULAR | Status: AC
  Filled 2019-07-08: qty 2

## 2019-07-08 MED ORDER — 0.9 % SODIUM CHLORIDE (POUR BTL) OPTIME
TOPICAL | Status: DC | PRN
  Administered 2019-07-08: 2000 mL
  Administered 2019-07-08: 1000 mL

## 2019-07-08 MED ORDER — BUPIVACAINE HCL (PF) 0.25 % IJ SOLN
INTRAMUSCULAR | Status: DC | PRN
  Administered 2019-07-08: 30 mL

## 2019-07-08 MED ORDER — ROCURONIUM BROMIDE 50 MG/5ML IV SOSY
PREFILLED_SYRINGE | INTRAVENOUS | Status: DC | PRN
  Administered 2019-07-08: 20 mg via INTRAVENOUS
  Administered 2019-07-08: 60 mg via INTRAVENOUS

## 2019-07-08 MED ORDER — LIDOCAINE 2% (20 MG/ML) 5 ML SYRINGE
INTRAMUSCULAR | Status: AC
  Filled 2019-07-08: qty 5

## 2019-07-08 MED ORDER — PROMETHAZINE HCL 25 MG/ML IJ SOLN
INTRAMUSCULAR | Status: AC
  Filled 2019-07-08: qty 1

## 2019-07-08 MED ORDER — VANCOMYCIN HCL 1000 MG IV SOLR
INTRAVENOUS | Status: AC
  Filled 2019-07-08: qty 1000

## 2019-07-08 MED ORDER — LACTATED RINGERS IV SOLN: INTRAVENOUS | Status: DC

## 2019-07-08 MED ORDER — ACETAMINOPHEN 10 MG/ML IV SOLN
INTRAVENOUS | Status: AC
  Filled 2019-07-08: qty 100

## 2019-07-08 MED ORDER — PHENYLEPHRINE 40 MCG/ML (10ML) SYRINGE FOR IV PUSH (FOR BLOOD PRESSURE SUPPORT)
PREFILLED_SYRINGE | INTRAVENOUS | Status: DC | PRN
  Administered 2019-07-08: 80 ug via INTRAVENOUS

## 2019-07-08 MED ORDER — DEXAMETHASONE SODIUM PHOSPHATE 10 MG/ML IJ SOLN
INTRAMUSCULAR | Status: DC | PRN
  Administered 2019-07-08: 5 mg via INTRAVENOUS

## 2019-07-08 MED ORDER — FENTANYL CITRATE (PF) 100 MCG/2ML IJ SOLN
INTRAMUSCULAR | Status: DC | PRN
  Administered 2019-07-08 (×5): 50 ug via INTRAVENOUS

## 2019-07-08 MED ORDER — OXYCODONE HCL 5 MG/5ML PO SOLN: 5.0000 mg | Freq: Once | ORAL | Status: AC | PRN

## 2019-07-08 MED ORDER — GLYCOPYRROLATE PF 0.2 MG/ML IJ SOSY
PREFILLED_SYRINGE | INTRAMUSCULAR | Status: AC
  Filled 2019-07-08: qty 1

## 2019-07-08 MED ORDER — HYDROMORPHONE HCL 1 MG/ML IJ SOLN
0.2500 mg | INTRAMUSCULAR | Status: DC | PRN
  Administered 2019-07-08: 0.5 mg via INTRAVENOUS

## 2019-07-08 MED ORDER — LIDOCAINE 2% (20 MG/ML) 5 ML SYRINGE
INTRAMUSCULAR | Status: DC | PRN
  Administered 2019-07-08: 100 mg via INTRAVENOUS

## 2019-07-08 MED ORDER — RIVAROXABAN 10 MG PO TABS: 10.0000 mg | ORAL_TABLET | Freq: Every day | ORAL | 0 refills | Status: DC

## 2019-07-08 MED ORDER — EPHEDRINE SULFATE-NACL 50-0.9 MG/10ML-% IV SOSY
PREFILLED_SYRINGE | INTRAVENOUS | Status: DC | PRN
  Administered 2019-07-08 (×4): 5 mg via INTRAVENOUS

## 2019-07-08 MED ORDER — DEXAMETHASONE SODIUM PHOSPHATE 10 MG/ML IJ SOLN
INTRAMUSCULAR | Status: AC
  Filled 2019-07-08: qty 1

## 2019-07-08 MED ORDER — OXYCODONE HCL 5 MG PO TABS
5.0000 mg | ORAL_TABLET | Freq: Once | ORAL | Status: AC | PRN
  Administered 2019-07-08: 5 mg via ORAL

## 2019-07-08 MED ORDER — MIDAZOLAM HCL 5 MG/5ML IJ SOLN
INTRAMUSCULAR | Status: DC | PRN
  Administered 2019-07-08: 2 mg via INTRAVENOUS

## 2019-07-08 MED ORDER — MORPHINE SULFATE (PF) 4 MG/ML IV SOLN
INTRAVENOUS | Status: AC
  Filled 2019-07-08: qty 2

## 2019-07-08 MED ORDER — PHENYLEPHRINE HCL-NACL 10-0.9 MG/250ML-% IV SOLN
INTRAVENOUS | Status: DC | PRN
  Administered 2019-07-08: 60 ug/min via INTRAVENOUS

## 2019-07-08 MED ORDER — PROPOFOL 10 MG/ML IV BOLUS
INTRAVENOUS | Status: AC
  Filled 2019-07-08: qty 20

## 2019-07-08 MED ORDER — PHENYLEPHRINE 40 MCG/ML (10ML) SYRINGE FOR IV PUSH (FOR BLOOD PRESSURE SUPPORT)
PREFILLED_SYRINGE | INTRAVENOUS | Status: AC
  Filled 2019-07-08: qty 10

## 2019-07-08 MED ORDER — ROCURONIUM BROMIDE 10 MG/ML (PF) SYRINGE
PREFILLED_SYRINGE | INTRAVENOUS | Status: AC
  Filled 2019-07-08: qty 10

## 2019-07-08 MED ORDER — EPHEDRINE 5 MG/ML INJ
INTRAVENOUS | Status: AC
  Filled 2019-07-08: qty 10

## 2019-07-08 MED ORDER — PROPOFOL 10 MG/ML IV BOLUS
INTRAVENOUS | Status: DC | PRN
  Administered 2019-07-08: 200 mg via INTRAVENOUS

## 2019-07-08 MED ORDER — CEFAZOLIN SODIUM-DEXTROSE 2-4 GM/100ML-% IV SOLN
2.0000 g | INTRAVENOUS | Status: AC
  Administered 2019-07-08: 2 g via INTRAVENOUS
  Filled 2019-07-08: qty 100

## 2019-07-08 SURGICAL SUPPLY — 59 items
BANDAGE ESMARK 6X9 LF (GAUZE/BANDAGES/DRESSINGS) ×1 IMPLANT
BIT DRILL 7/64X5 DISP (BIT) ×2 IMPLANT
BNDG COHESIVE 4X5 TAN STRL (GAUZE/BANDAGES/DRESSINGS) ×2 IMPLANT
BNDG ELASTIC 6X10 VLCR STRL LF (GAUZE/BANDAGES/DRESSINGS) ×1 IMPLANT
BNDG ELASTIC 6X5.8 VLCR STR LF (GAUZE/BANDAGES/DRESSINGS) ×2 IMPLANT
BNDG ESMARK 6X9 LF (GAUZE/BANDAGES/DRESSINGS) ×2
BNDG GAUZE ELAST 4 BULKY (GAUZE/BANDAGES/DRESSINGS) ×2 IMPLANT
CLSR STERI-STRIP ANTIMIC 1/2X4 (GAUZE/BANDAGES/DRESSINGS) ×1 IMPLANT
COVER MAYO STAND STRL (DRAPES) ×2 IMPLANT
COVER WAND RF STERILE (DRAPES) ×2 IMPLANT
CUFF TOURN SGL QUICK 34 (TOURNIQUET CUFF)
CUFF TOURN SGL QUICK 42 (TOURNIQUET CUFF) IMPLANT
CUFF TRNQT CYL 34X4.125X (TOURNIQUET CUFF) IMPLANT
DRAPE U-SHAPE 47X51 STRL (DRAPES) ×2 IMPLANT
DRSG ADAPTIC 3X8 NADH LF (GAUZE/BANDAGES/DRESSINGS) ×2 IMPLANT
DRSG PAD ABDOMINAL 8X10 ST (GAUZE/BANDAGES/DRESSINGS) ×2 IMPLANT
DRSG TEGADERM 4X4.5 CHG (GAUZE/BANDAGES/DRESSINGS) ×1 IMPLANT
DURAPREP 26ML APPLICATOR (WOUND CARE) ×2 IMPLANT
ELECT REM PT RETURN 9FT ADLT (ELECTROSURGICAL) ×2
ELECTRODE REM PT RTRN 9FT ADLT (ELECTROSURGICAL) ×1 IMPLANT
GAUZE SPONGE 4X4 12PLY STRL (GAUZE/BANDAGES/DRESSINGS) ×2 IMPLANT
GAUZE SPONGE 4X4 16PLY XRAY LF (GAUZE/BANDAGES/DRESSINGS) ×1 IMPLANT
GAUZE XEROFORM 5X9 LF (GAUZE/BANDAGES/DRESSINGS) ×2 IMPLANT
GLOVE BIOGEL PI IND STRL 8 (GLOVE) ×1 IMPLANT
GLOVE BIOGEL PI INDICATOR 8 (GLOVE) ×1
GLOVE ECLIPSE 8.0 STRL XLNG CF (GLOVE) ×2 IMPLANT
GOWN STRL REUS W/ TWL LRG LVL3 (GOWN DISPOSABLE) ×2 IMPLANT
GOWN STRL REUS W/TWL 2XL LVL3 (GOWN DISPOSABLE) ×2 IMPLANT
GOWN STRL REUS W/TWL LRG LVL3 (GOWN DISPOSABLE) ×4
IMMOBILIZER KNEE 22 UNIV (SOFTGOODS) IMPLANT
KIT BASIN OR (CUSTOM PROCEDURE TRAY) ×2 IMPLANT
KIT TURNOVER KIT B (KITS) ×2 IMPLANT
MANIFOLD NEPTUNE II (INSTRUMENTS) ×2 IMPLANT
NDL HYPO 21X1.5 SAFETY (NEEDLE) IMPLANT
NDL MAYO TROCAR (NEEDLE) ×1 IMPLANT
NEEDLE HYPO 21X1.5 SAFETY (NEEDLE) ×2 IMPLANT
NEEDLE MAYO TROCAR (NEEDLE) ×2 IMPLANT
NS IRRIG 1000ML POUR BTL (IV SOLUTION) ×2 IMPLANT
PACK ORTHO EXTREMITY (CUSTOM PROCEDURE TRAY) ×2 IMPLANT
PAD ARMBOARD 7.5X6 YLW CONV (MISCELLANEOUS) ×4 IMPLANT
PADDING CAST COTTON 6X4 STRL (CAST SUPPLIES) ×2 IMPLANT
PASSER SUT SWANSON 36MM LOOP (INSTRUMENTS) ×2 IMPLANT
SPONGE LAP 18X18 RF (DISPOSABLE) ×2 IMPLANT
STAPLER VISISTAT 35W (STAPLE) ×2 IMPLANT
STOCKINETTE IMPERVIOUS 9X36 MD (GAUZE/BANDAGES/DRESSINGS) ×2 IMPLANT
SUT FIBERWIRE #2 38 T-5 BLUE (SUTURE) ×2
SUT VIC AB 0 CT1 27 (SUTURE) ×2
SUT VIC AB 0 CT1 27XBRD ANBCTR (SUTURE) ×1 IMPLANT
SUT VIC AB 2-0 CT1 27 (SUTURE) ×2
SUT VIC AB 2-0 CT1 TAPERPNT 27 (SUTURE) ×1 IMPLANT
SUT VICRYL 0 UR6 27IN ABS (SUTURE) ×4 IMPLANT
SUTURE FIBERWR #2 38 T-5 BLUE (SUTURE) ×1 IMPLANT
SYR 30ML LL (SYRINGE) ×1 IMPLANT
TOWEL GREEN STERILE (TOWEL DISPOSABLE) ×2 IMPLANT
TOWEL GREEN STERILE FF (TOWEL DISPOSABLE) ×2 IMPLANT
TUBE CONNECTING 12X1/4 (SUCTIONS) ×2 IMPLANT
UNDERPAD 30X30 (UNDERPADS AND DIAPERS) ×2 IMPLANT
WATER STERILE IRR 1000ML POUR (IV SOLUTION) ×2 IMPLANT
YANKAUER SUCT BULB TIP NO VENT (SUCTIONS) ×2 IMPLANT

## 2019-07-08 NOTE — H&P (Signed)
[name] is an 54 y.o. male.   Chief Complaint: Left knee Baker cyst symptomatic HPI: [name] is a 54 year old patient with enlarging left knee Baker's cyst.  MRI scan confirms the cystic nature of this lesion.  He has failed an aspiration and injection of the Baker cyst.  Presents now for operative management after explanation of risks and benefits.  He has had prior arthroscopy were meniscal pathology was addressed.  No personal or family history of DVT or pulmonary embolism.  Past Medical History:  Diagnosis Date  . Alcohol-induced chronic pancreatitis ([place]) 01/09/2019  . Anxiety   . Anxiety and depression   . Arthritis    knees and shoulders  . Bruit    L  . Chest pain    hx  . Diabetes mellitus without complication ([place])    "borderline", diet controlled, no meds, patient has lost 30 lbs  . Edema   . Fatty liver   . GERD (gastroesophageal reflux disease)    uses Omeprazole  . Goiter   . HLD (hyperlipidemia)   . Hypertension    essential, benign  . Hypothyroidism   . Impotence of organic origin   . Murmur    never has caused any problems  . Neuromuscular disorder ([name])   . Other chest pain    tightness, pressure  . Palpitation    hx  . Precordial pain   . Sleep apnea    uses cpap    Past Surgical History:  Procedure Laterality Date  . APPENDECTOMY  2002   done at [facility]  . CARDIAC CATHETERIZATION    . CARDIAC CATHETERIZATION N/A 08/27/2015   Procedure: Left Heart Cath and Coronary Angiography;  Surgeon: Jolaine Artist, MD;  Location: [place] [facility];  Service: Cardiovascular;  Laterality: N/A;  . COLONOSCOPY    . HERNIA REPAIR  [id number]   umbilical  . JOINT REPLACEMENT     right knee  . KNEE ARTHROSCOPY Left 12/17/2018   Procedure: left knee arthroscopy, meniscal debridement, loose body removal;  Surgeon: Meredith Pel, MD;  Location: [place];  Service: Orthopedics;  Laterality: Left;  . REVISION TOTAL KNEE ARTHROPLASTY Right 06/23/2014   DR Marlou Sa   . TONSILLECTOMY    . TOTAL KNEE ARTHROPLASTY Right 2011   right  . TOTAL KNEE REVISION Right 06/23/2014   Procedure: TOTAL KNEE REVISION;  Surgeon: Meredith Pel, MD;  Location: [place];  Service: Orthopedics;  Laterality: Right;  . WISDOM TOOTH EXTRACTION      Family History  Problem Relation Age of Onset  . Heart disease Father    Social History:  reports that he quit smoking about 14 years ago. His smoking use included cigarettes. He has a 36.00 pack-year smoking history. He has never used smokeless tobacco. He reports current alcohol use of about 8.0 - 9.0 standard drinks of alcohol per week. He reports that he does not use drugs.  Allergies:  Allergies  Allergen Reactions  . Ambien [Zolpidem] Swelling and Other (See Comments)    Swelling in the throat    Medications Prior to Admission  Medication Sig Dispense Refill  . ARIPiprazole (ABILIFY) 5 MG tablet Take one tab daily (Patient taking differently: Take 5 mg by mouth daily. ) 30 tablet 1  . buPROPion (WELLBUTRIN XL) 300 MG 24 hr tablet Take 1 tablet (300 mg total) by mouth daily. 30 tablet 1  . busPIRone (BUSPAR) 15 MG tablet Take 1 tablet (15 mg total) by mouth 2 (two)  times daily. 60 tablet 1  . clonazePAM (KLONOPIN) 1 MG tablet Take 1 tablet by mouth twice daily as needed for anxiety (Patient taking differently: Take 1 mg by mouth 2 (two) times daily. ) 60 tablet 2  . dicyclomine (BENTYL) 20 MG tablet TAKE 1 TABLET (20 MG TOTAL) BY MOUTH EVERY 6 (SIX) HOURS. (Patient taking differently: Take 20 mg by mouth 4 (four) times daily -  before meals and at bedtime. ) 120 tablet 5  . DULoxetine (CYMBALTA) 30 MG capsule TAKE 1 CAPSULE BY MOUTH ONCE DAILY WITH BREAKFAST (Patient taking differently: Take 30 mg by mouth daily with breakfast. ) 30 capsule 0  . EUTHYROX 88 MCG tablet TAKE 1 TABLET BY MOUTH ONCE DAILY BEFORE BREAKFAST (Patient taking differently: Take 88 mcg by mouth daily before breakfast. ) 90 tablet 1  . gabapentin  (NEURONTIN) 600 MG tablet Take 1 tablet (600 mg total) by mouth 2 (two) times daily. 270 tablet 1  . ibuprofen (ADVIL,MOTRIN) 200 MG tablet Take 400 mg by mouth every 6 (six) hours as needed (pain).    Marland Kitchen losartan-hydrochlorothiazide (HYZAAR) 100-25 MG tablet Take 1 tablet by mouth daily. (Patient taking differently: Take 1 tablet by mouth at bedtime. ) 90 tablet 3  . meloxicam (MOBIC) 15 MG tablet Take 1 tablet by mouth once daily (Patient taking differently: Take 15 mg by mouth daily. ) 60 tablet 0  . methocarbamol (ROBAXIN) 500 MG tablet Take 1 tablet (500 mg total) by mouth every 8 (eight) hours as needed. 30 tablet 0  . metoprolol tartrate (LOPRESSOR) 25 MG tablet Take 1 tablet by mouth twice daily (Patient taking differently: Take 25 mg by mouth 2 (two) times daily. ) 180 tablet 1  . Multiple Vitamin (MULTIVITAMIN WITH MINERALS) TABS tablet Take 1 tablet by mouth daily.    Marland Kitchen omeprazole (PRILOSEC) 40 MG capsule Take 1 capsule by mouth twice daily (Patient taking differently: Take 40 mg by mouth in the morning and at bedtime. ) 180 capsule 1  . promethazine (PHENERGAN) 12.5 MG tablet Take 1 tablet (12.5 mg total) by mouth every 8 (eight) hours as needed for nausea or vomiting. 20 tablet 0  . SUPER B COMPLEX/C PO Take 1 tablet by mouth daily.    . traMADol (ULTRAM) 50 MG tablet TAKE 1 TABLET BY MOUTH ONCE DAILY AS NEEDED (Patient taking differently: Take 50 mg by mouth daily as needed (pain). ) 20 tablet 0  . traZODone (DESYREL) 100 MG tablet Take 1 tablet (100 mg total) by mouth at bedtime. 30 tablet 1  . aspirin EC 81 MG tablet Take 1 tablet (81 mg total) by mouth at bedtime. 30 tablet 0  . cephALEXin (KEFLEX) 500 MG capsule Take 1 capsule (500 mg total) by mouth 3 (three) times daily. (Patient not taking: Reported on 07/03/2019) 30 capsule 0  . clobetasol (TEMOVATE) 0.05 % external solution Apply 1 application topically 2 (two) times daily as needed (scalp irritation).    . Clobetasol Propionate  0.05 % shampoo Apply to dry scalp and leave on for 15 minutes (Patient taking differently: Apply 1 application topically 2 (two) times a week. Apply to dry scalp and leave on for 15 minutes as needed for itching.) 118 mL 5  . oxyCODONE (ROXICODONE) 5 MG immediate release tablet Take 1 tablet (5 mg total) by mouth every 4 (four) hours as needed for severe pain. Only take after surgery on 07/08/2019 35 tablet 0  . oxyCODONE-acetaminophen (PERCOCET) 5-325 MG tablet Take 1 tablet  by mouth every 6 (six) hours as needed for severe pain. (Patient not taking: Reported on 06/10/2019) 30 tablet 0    Results for orders placed or performed during the hospital encounter of 07/08/19 (from the past 48 hour(s))  Glucose, capillary     Status: Abnormal   Collection Time: 07/08/19  9:37 AM  Result Value Ref Range   Glucose-Capillary 141 (H) 70 - 99 mg/dL    Comment: Glucose reference range applies only to samples taken after fasting for at least 8 hours.   No results found.  Review of Systems  Musculoskeletal: Positive for arthralgias.  All other systems reviewed and are negative.   Blood pressure (!) 144/94, pulse (!) 57, temperature 97.7 F (36.5 C), temperature source Oral, resp. rate 18, height 6' (1.829 m), weight 99.8 kg, SpO2 99 %. Physical Exam  Constitutional: He appears well-developed.  HENT:  Head: Normocephalic.  Eyes: Pupils are equal, round, and reactive to light.  Cardiovascular: Normal rate.  Respiratory: Effort normal.  Musculoskeletal:     Cervical back: Normal range of motion.  Neurological: He is alert.  Skin: Skin is warm.  Psychiatric: He has a normal mood and affect.    Examination of the left knee demonstrates no effusion but sizable Baker's cyst between the medial head of the gastroc and the semimember gnosis and semitendinosis.  Is extending proximal and slightly anterior.  Is fluid-filled.  Mildly tender to palpation.  Collateral and cruciate ligaments of the left knee  intact.  Pedal pulses palpable. Assessment/Plan Impression is symptomatic left knee Baker cyst with anterior extension and loculated nature based on ultrasound examination and MRI examination.  Plan is open excision of the Baker cyst.  May use gastroc fascial flap to assist in closure.  Risk benefits are discussed include not limited to infection nerve vessel damage saphenous nerve paresthesias.  Patient understands risk benefits including recurrence.  All questions answered.  Anticipate weightbearing as tolerated after about 3 to 4 days on crutches.  Anderson Malta, MD 07/08/2019, 10:24 AM

## 2019-07-08 NOTE — Op Note (Signed)
NAME: [name], [name] MEDICAL RECORD [id number] ACCOUNT [id number] DATE OF BIRTH:10/24/1965 FACILITY: [facility] LOCATION: [facility] PHYSICIAN:Dazha Kempa Randel Pigg, MD  OPERATIVE REPORT  DATE OF PROCEDURE:  07/08/2019  PREOPERATIVE DIAGNOSIS:  Left knee Baker cyst.  POSTOPERATIVE DIAGNOSIS:  Left knee Baker cyst.  PROCEDURE:  Open excision posterior approach, left knee Baker cyst.  SURGEON:  Meredith Pel, MD  ASSISTANT:  Annie Main, PA  INDICATIONS:  The patient is a 54 year old patient with symptomatic left knee Baker cyst, which has been refractory to nonoperative management.  He presents for operative management after explanation of risks and benefits.  PROCEDURE IN DETAIL:  The patient was brought to the operating room where general anesthetic was induced.  Preoperative antibiotics administered.  Timeout was called.  Left leg was prescrubbed with alcohol and Betadine, allowed to air dry, prepped with  DuraPrep solution and draped in a sterile manner.  Ioban used to cover the operative field.  Timeout was called.  Leg was elevated and exsanguinated with the Esmarch wrapped, tourniquet inflated to 300 mmHg for 48 minutes.  A curvilinear approach was  made to the posterior aspect of the knee beginning proximal medial and extending distal to the midportion of the gastroc.  Skin and subcutaneous tissue were sharply divided.  The fascia was incised.  Plane developed between the medial head of the gastroc  and semimembranosus and semitendinosis.  Blunt dissection was performed.  Care was taken to avoid injury to neurovascular structures.  The saphenous vein and nerve were identified and protected.  Next, the cyst was identified.  It was a rather large  cyst.  Careful dissection was performed using a Cobb elevator as well as scissors.  Digital dissection was also performed.  The cyst was removed from surrounding tissues.  The stalk was traced back to the capsule.  The cyst was truncated and  sent to  pathology.  The stalk was oversewn and the capsule was also oversewn and coagulated.  The one on the capsular rent was also closed using 0 Vicryl suture.  Next, thorough irrigation was performed, skin edges anesthetized using combination of Marcaine,  morphine, clonidine.  Tourniquet was released.  Bleeding points encountered were controlled using electrocautery.  No significant bleeding points were encountered.  The skin was closed using 0 Vicryl suture, 2-0 Vicryl suture and 3-0 Monocryl.   Steri-Strips and an impervious dressings applied, along with an Ace wrap.  The patient tolerated the procedure well without immediate complications.  He was transferred to the recovery room.  Weightbearing as tolerated, range of motion as tolerated  moving forward.  He will use crutches for the first several days.  He will also be placed on Xarelto 10 mg daily until his next clinic appointment, starting tomorrow.  Does have a history of superficial DVT in the right leg.  [name]'s assistance was  required at all times for the case for retraction, opening, closing and cyst mobilization.  His assistance was a medical necessity.  VN/NUANCE  D:07/08/2019 T:07/08/2019 JOB:[id number]

## 2019-07-08 NOTE — Anesthesia Postprocedure Evaluation (Signed)
Anesthesia Post Note  Patient: [name]  Procedure(s) Performed: open excision baker's cyst left knee (Left )     Patient location during evaluation: PACU Anesthesia Type: General Level of consciousness: awake and alert Pain management: pain level controlled Vital Signs Assessment: post-procedure vital signs reviewed and stable Respiratory status: spontaneous breathing, nonlabored ventilation, respiratory function stable and patient connected to nasal cannula oxygen Cardiovascular status: blood pressure returned to baseline and stable Postop Assessment: no apparent nausea or vomiting Anesthetic complications: no    Last Vitals:  Vitals:   07/08/19 1345 07/08/19 1400  BP: 116/68 102/69  Pulse: 67 76  Resp: 13 15  Temp:  36.6 C  SpO2: 98% 96%    Last Pain:  Vitals:   07/08/19 1400  TempSrc:   PainSc: 4                  Audrea Bolte P Faisal Stradling

## 2019-07-08 NOTE — Anesthesia Procedure Notes (Signed)
Procedure Name: Intubation Date/Time: 07/08/2019 10:58 AM Performed by: Orlie Dakin, CRNA Pre-anesthesia Checklist: Patient identified, Emergency Drugs available, Suction available and Patient being monitored Patient Re-evaluated:Patient Re-evaluated prior to induction Oxygen Delivery Method: Circle system utilized Preoxygenation: Pre-oxygenation with 100% oxygen Induction Type: IV induction Ventilation: Mask ventilation without difficulty and Oral airway inserted - appropriate to patient size Laryngoscope Size: Glidescope and 4 Grade View: Grade I Tube type: Oral Tube size: 7.5 mm Number of attempts: 1 Airway Equipment and Method: Stylet and Video-laryngoscopy Placement Confirmation: ETT inserted through vocal cords under direct vision,  positive ETCO2 and breath sounds checked- equal and bilateral Secured at: 23 cm Tube secured with: Tape Dental Injury: Teeth and Oropharynx as per pre-operative assessment  Comments: Glidescope electively used due to H/O cervical pain at rest, during sleep.  Full ROM of neck without pain or discomfort in Short-Stay.  Head, neck positioned in extension by patient, "ok with this" position prior to induction.  Minimal neck movement with DL. 4x4s bite block used.

## 2019-07-08 NOTE — Transfer of Care (Signed)
Immediate Anesthesia Transfer of Care Note  Patient: [name]  Procedure(s) Performed: open excision baker's cyst left knee (Left )  Patient Location: PACU  Anesthesia Type:General  Level of Consciousness: drowsy  Airway & Oxygen Therapy: Patient Spontanous Breathing and Patient connected to face mask oxygen  Post-op Assessment: Report given to RN and Post -op Vital signs reviewed and stable  Post vital signs: Reviewed and stable  Last Vitals:  Vitals Value Taken Time  BP 114/66 07/08/19 1300  Temp    Pulse 61 07/08/19 1301  Resp 9 07/08/19 1301  SpO2 95 % 07/08/19 1301  Vitals shown include unvalidated device data.  Last Pain:  Vitals:   07/08/19 0939  TempSrc: Oral  PainSc:       Patients Stated Pain Goal: 3 ([id number])  Complications: No apparent anesthesia complications

## 2019-07-08 NOTE — Brief Op Note (Signed)
   07/08/2019  12:37 PM  PATIENT:  [name]  54 y.o. male  PRE-OPERATIVE DIAGNOSIS:  left knee baker's cyst  POST-OPERATIVE DIAGNOSIS:  left knee baker's cyst  PROCEDURE:  Procedure(s): open excision baker's cyst left knee  SURGEON:  Surgeon(s): Meredith Pel, MD  ASSISTANT: magnant pa  ANESTHESIA:   general  EBL: 20 ml    Total I/O In: 1000 [I.V.:1000] Out: -   BLOOD ADMINISTERED: none  DRAINS: none   LOCAL MEDICATIONS USED:  none  SPECIMEN:  Cyst to path  COUNTS:  YES  TOURNIQUET:   Total Tourniquet Time Documented: Thigh (Left) - 49 minutes Total: Thigh (Left) - 49 minutes   DICTATION: .Other Dictation: Dictation Number [id number]  PLAN OF CARE: Discharge to home after PACU  PATIENT DISPOSITION:  PACU - hemodynamically stable

## 2019-07-09 ENCOUNTER — Other Ambulatory Visit: Payer: Self-pay | Admitting: Internal Medicine

## 2019-07-09 LAB — SURGICAL PATHOLOGY

## 2019-07-09 NOTE — Telephone Encounter (Signed)
Last Fill 03/12/19 for 60 tablets on clonazepam and last OV 01/08/19

## 2019-07-09 NOTE — Telephone Encounter (Signed)
LAST REFILL WITHOUT AN OFFICE VISIT

## 2019-07-16 ENCOUNTER — Telehealth: Payer: Self-pay

## 2019-07-16 ENCOUNTER — Encounter: Payer: Self-pay | Admitting: Orthopedic Surgery

## 2019-07-16 ENCOUNTER — Ambulatory Visit (INDEPENDENT_AMBULATORY_CARE_PROVIDER_SITE_OTHER): Payer: 59 | Admitting: Orthopedic Surgery

## 2019-07-16 ENCOUNTER — Other Ambulatory Visit: Payer: Self-pay

## 2019-07-16 DIAGNOSIS — M7120 Synovial cyst of popliteal space [Baker], unspecified knee: Secondary | ICD-10-CM

## 2019-07-16 MED ORDER — OXYCODONE HCL 5 MG PO TABS: 5.0000 mg | ORAL_TABLET | Freq: Three times a day (TID) | ORAL | 0 refills | Status: DC | PRN

## 2019-07-16 NOTE — Telephone Encounter (Signed)
submitted

## 2019-07-16 NOTE — Telephone Encounter (Signed)
Patient seen by Dr Marlou Sa Can you please submit refill of oxy 5 1 po q 8 hrs prn pain #35

## 2019-07-17 ENCOUNTER — Ambulatory Visit (HOSPITAL_COMMUNITY)
Admission: RE | Admit: 2019-07-17 | Discharge: 2019-07-17 | Disposition: A | Discharge status: Home / Self Care | Payer: 59 | Source: Ambulatory Visit | Attending: Orthopedic Surgery | Admitting: Orthopedic Surgery

## 2019-07-17 DIAGNOSIS — M7120 Synovial cyst of popliteal space [Baker], unspecified knee: Secondary | ICD-10-CM

## 2019-07-17 DIAGNOSIS — M7122 Synovial cyst of popliteal space [Baker], left knee: Secondary | ICD-10-CM

## 2019-07-17 NOTE — Progress Notes (Signed)
VASCULAR LAB PRELIMINARY  PRELIMINARY  PRELIMINARY  PRELIMINARY  Left lower extremity venous duplex completed.    Preliminary report:  See CV proc for preliminary results.  Called Dr. Marlou Sa with results.   Madison Albea, RVT 07/17/2019, 11:30 AM

## 2019-07-17 NOTE — Progress Notes (Signed)
I took this message.  I have requested that the technologist tell [name] to stop the Xarelto and take 1 baby aspirin per day.  That should be sufficient.

## 2019-07-18 ENCOUNTER — Encounter: Payer: Self-pay | Admitting: Orthopedic Surgery

## 2019-07-18 NOTE — Progress Notes (Signed)
Post-Op Visit Note   Patient: [name]           Date of Birth: 10-23-1965           MRN: [name] Visit Date: 07/16/2019 PCP: [name], MD   Assessment & Plan:  Chief Complaint:  Chief Complaint  Patient presents with  . Left Knee - Pain   Visit Diagnoses:  1. Synovial cyst of popliteal space, unspecified laterality     Plan: Patient is a 54 year old male who presents s/p left knee Baker cyst excision on 07/08/2019.  Patient complains of a lot of swelling and posterior pain.  He is on Xarelto for DVT prophylaxis.  Incision is healing well on exam today.  He has 0 degrees of knee extension with greater than 90 degrees of flexion.  He is taking oxycodone for pain.  This was refilled today.  Ultrasound of the left lower extremity was ordered to rule out DVT.  This was found to be negative for DVT and patient will be transition from Xarelto to aspirin.  Patient will do his own physical therapy.  Plan for return office visit in 3 weeks for clinical recheck.  Follow-Up Instructions: No follow-ups on file.   Orders:  Orders Placed This Encounter  Procedures  . VAS [place] LOWER EXTREMITY VENOUS (DVT)   Meds ordered this encounter  Medications  . oxyCODONE (ROXICODONE) 5 MG immediate release tablet    Sig: Take 1 tablet (5 mg total) by mouth every 8 (eight) hours as needed for severe pain. Only take after surgery on 07/08/2019    Dispense:  35 tablet    Refill:  0    Imaging: No results found.  PMFS History: Patient Active Problem List   Diagnosis Date Noted  . Radiculopathy, cervical region 06/10/2019  . Carpal tunnel syndrome, left upper limb 06/10/2019  . Decreased GFR 01/09/2019  . Benzodiazepine withdrawal with complication ([place]) [id number]  . 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 Spinal stenosis of cervical region 12/31/2018  . Loose body of left knee   . Acute medial meniscus tear of left knee   . Plantar fasciitis of left foot 08/24/2017  . Burning  sensation of feet 06/20/2017  . Chronic pain of both hips 06/20/2017  . Angioedema of lips 11/26/2016  . Cognitive complaints with normal neuropsychological exam 10/23/2016  . 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 Hypothyroidism due to acquired atrophy of thyroid 09/14/2015  . Arthritis of knee 06/23/2014  . Encounter for preventive health examination 06/17/2014  . Enlarged RV (right ventricle) 06/17/2014  . GERD (gastroesophageal reflux disease) 04/20/2014  . Cervical spine degeneration 02/17/2014  . Overweight 10/29/2013  . Chronic knee pain 10/29/2013  . Diabetes mellitus type 2 in obese ([place]) 10/23/2012  . Seborrheic dermatitis, unspecified 05/17/2012  . Fatty liver 04/22/2012  . Hypogonadism male 03/12/2012  . Sciatica of right side 12/24/2010  . Hyperlipidemia associated with type 2 diabetes mellitus ([place]) 01/13/2009  . Anxiety state 01/13/2009  . Essential hypertension 01/13/2009  . OSA (obstructive sleep apnea) 01/13/2009   Past Medical History:  Diagnosis Date  . Alcohol-induced chronic pancreatitis ([place]) 01/09/2019  . Anxiety   . Anxiety and depression   . Arthritis    knees and shoulders  . Bruit    L  . Chest pain    hx  . Diabetes mellitus without complication ([place])    "  borderline", diet controlled, no meds, patient has lost 30 lbs  . Edema   . Fatty liver   . GERD (gastroesophageal reflux disease)    uses Omeprazole  . Goiter   . HLD (hyperlipidemia)   . Hypertension    essential, benign  . Hypothyroidism   . Impotence of organic origin   . Murmur    never has caused any problems  . Neuromuscular disorder ([place])   . Other chest pain    tightness, pressure  . Palpitation    hx  . Precordial pain   . Sleep apnea    uses cpap    Family History  Problem Relation Age of Onset  . Heart disease Father     Past Surgical History:    Procedure Laterality Date  . APPENDECTOMY  2002   done at [facility]  . CARDIAC CATHETERIZATION    . CARDIAC CATHETERIZATION N/A 08/27/2015   Procedure: Left Heart Cath and Coronary Angiography;  Surgeon: Jolaine Artist, MD;  Location: [place] [facility];  Service: Cardiovascular;  Laterality: N/A;  . COLONOSCOPY    . EAR CYST EXCISION Left 07/08/2019   Procedure: open excision baker's cyst left knee;  Surgeon: Meredith Pel, MD;  Location: [place];  Service: Orthopedics;  Laterality: Left;  . HERNIA REPAIR  [id number]   umbilical  . JOINT REPLACEMENT     right knee  . KNEE ARTHROSCOPY Left 12/17/2018   Procedure: left knee arthroscopy, meniscal debridement, loose body removal;  Surgeon: Meredith Pel, MD;  Location: [place];  Service: Orthopedics;  Laterality: Left;  . REVISION TOTAL KNEE ARTHROPLASTY Right 06/23/2014   DR Marlou Sa  . TONSILLECTOMY    . TOTAL KNEE ARTHROPLASTY Right 2011   right  . TOTAL KNEE REVISION Right 06/23/2014   Procedure: TOTAL KNEE REVISION;  Surgeon: Meredith Pel, MD;  Location: [place];  Service: Orthopedics;  Laterality: Right;  . WISDOM TOOTH EXTRACTION     Social History   Occupational History  . Occupation: IT trainer  . Occupation: SERVICE DIRECTOR    Employer: [employer]  Tobacco Use  . Smoking status: Former Smoker    Packs/day: 2.00    Years: 18.00    Pack years: 36.00    Types: Cigarettes    Quit date: 04/24/2005    Years since quitting: 14.2  . Smokeless tobacco: Never Used  . Tobacco comment: quit 20 years ago   Substance and Sexual Activity  . Alcohol use: Yes    Alcohol/week: 8.0 - 9.0 standard drinks    Types: 6 Cans of beer, 2 - 3 Shots of liquor per week    Comment: beer/liquor  . Drug use: No  . Sexual activity: Yes

## 2019-07-21 ENCOUNTER — Other Ambulatory Visit: Payer: Self-pay | Admitting: Podiatry

## 2019-07-21 ENCOUNTER — Other Ambulatory Visit: Payer: Self-pay | Admitting: Internal Medicine

## 2019-07-22 ENCOUNTER — Other Ambulatory Visit: Payer: Self-pay

## 2019-07-22 ENCOUNTER — Ambulatory Visit (INDEPENDENT_AMBULATORY_CARE_PROVIDER_SITE_OTHER): Payer: 59 | Admitting: Licensed Clinical Social Worker

## 2019-07-22 DIAGNOSIS — F411 Generalized anxiety disorder: Secondary | ICD-10-CM | POA: Diagnosis not present

## 2019-07-22 DIAGNOSIS — F0632 Mood disorder due to known physiological condition with major depressive-like episode: Secondary | ICD-10-CM

## 2019-07-22 NOTE — Progress Notes (Signed)
Virtual Visit via Telephone Note  I connected with [name] on 07/22/19 at  1:00 PM EDT by telephone and verified that I am speaking with the correct person using two identifiers.  The patient was advised to call back or seek an in-person evaluation if the symptoms worsen or if the condition fails to improve as anticipated.  I provided 29 minutes of non-face-to-face time during this encounter.   Renee Harder, LCSW    THERAPIST PROGRESS NOTE  Session Time: 1:00pm-1:29pm  Participation Level: Active  Behavioral Response: NAAlertEuthymic  Type of Therapy: Individual Therapy  Treatment Goals addressed: Coping  Interventions: Motivational Interviewing  Summary: Client presented alert and oriented x5 for today's session. Client checked in and provided an update by reporting increased energy and motivation. Client reported that his anxiety level has stayed about the same however he has felt more inclined to socialize and spend time outside when able. Client reported that he was approved for nutritional benefits and this has alleviated some financial stress however he is still waiting to hear on whether his social security benefits will be approved. Client processed his feelings regarding taking advantage of available benefits because he was always "taught to work for what you have". Client discussed that he has stepped out of his comfort zone in processing his feelings and stressors with family members and this has helped him to feel better and more supported. Client reported that he underwent orthopedic surgery and is hopeful to be medically cleared within the coming weeks. Client was receptive to feedback and reported that he plans to continue seeking support from loved ones and will be intentional on taking steps to engage in self-care. Client denied current SI/HI/psychosis.  Suicidal/Homicidal: Nowithout intent/plan  Therapist Response: Clinician joined client for today's session  and assessed for recent stressors, changes in mood, and SI/HI/psychosis. Clinician praised client's follow through with inquiring on any benefits that he is eligible for and validated feelings and statements regarding stigma surrounding those receiving financial benefits. Clinician utilized CBT to challenge client's thoughts regarding those statements and praised his insight and willingness to push through uncomfortable things to meet his own needs. 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 Clinician provided psycho-education on the importance of self-care and encouraged client to take time to himself by sitting outside for a little bit each day to alleviate stress and care for himself. Clinician scheduled client to be seen again on 09/02/19.  Plan: Return again in 4-5 weeks or soonest available appointment.  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

## 2019-07-30 ENCOUNTER — Other Ambulatory Visit: Payer: Self-pay

## 2019-07-30 ENCOUNTER — Encounter (HOSPITAL_COMMUNITY): Payer: Self-pay | Admitting: Psychiatry

## 2019-07-30 ENCOUNTER — Ambulatory Visit (INDEPENDENT_AMBULATORY_CARE_PROVIDER_SITE_OTHER): Payer: 59 | Admitting: Psychiatry

## 2019-07-30 DIAGNOSIS — F331 Major depressive disorder, recurrent, moderate: Secondary | ICD-10-CM | POA: Diagnosis not present

## 2019-07-30 DIAGNOSIS — F411 Generalized anxiety disorder: Secondary | ICD-10-CM

## 2019-07-30 MED ORDER — BUPROPION HCL ER (XL) 300 MG PO TB24: 300.0000 mg | ORAL_TABLET | Freq: Every day | ORAL | 2 refills | Status: DC

## 2019-07-30 MED ORDER — TRAZODONE HCL 100 MG PO TABS: 100.0000 mg | ORAL_TABLET | Freq: Every day | ORAL | 2 refills | Status: DC

## 2019-07-30 MED ORDER — BUSPIRONE HCL 15 MG PO TABS: 15.0000 mg | ORAL_TABLET | Freq: Two times a day (BID) | ORAL | 2 refills | Status: DC

## 2019-07-30 MED ORDER — ARIPIPRAZOLE 5 MG PO TABS: 5.0000 mg | ORAL_TABLET | Freq: Every day | ORAL | 2 refills | Status: DC

## 2019-07-30 NOTE — Progress Notes (Signed)
Virtual Visit via Telephone Note  I connected with [name] on 07/30/19 at 10:40 AM EDT by telephone and verified that I am speaking with the correct person using two identifiers.   I discussed the limitations, risks, security and privacy concerns of performing an evaluation and management service by telephone and the availability of in person appointments. I also discussed with the patient that there may be a patient responsible charge related to this service. The patient expressed understanding and agreed to proceed.   History of Present Illness: Patient was evaluated by phone session.  We increase Wellbutrin to 300 and he noticed much improvement in his energy, motivation.  He is still sometimes sleeps not as good but better than before.  He is taking trazodone and Klonopin.  Recently he had a surgery for his Baker's cyst and prescribed pain medication which he is hoping to finish in 2 days.  His pain is better.  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  His Social Security disability was denied and now he is in the process of appeal.  He had a good Easter and he was able to see family members.  Patient has chronic pain.  He is taking multiple medication but he feels seems to be doing better job and lately he is not as depressed and anxious.  His energy level improved.  His appetite is okay.  He had recently blood work and his hemoglobin A1c was 6.2.  He has no tremors or shakes.     Past Psychiatric History:Reviewed. H/Odepression and anxiety. No h/osuicidal attempt or inpatient treatment. Seen Dr. Jake [name] and given Zoloft, Klonopin and Cymbalta. Later PCP added BuSpar and trazodone.We tried Lamictal but did not help.  Recent Results (from the past 2160 hour(s))  Glucose, capillary     Status: Abnormal   Collection Time: 07/04/19 11:59 AM  Result Value Ref Range   Glucose-Capillary 134 (H) 70 - 99 mg/dL    Comment: Glucose reference range applies only  to samples taken after fasting for at least 8 hours.  Basic metabolic panel     Status: Abnormal   Collection Time: 07/04/19 12:25 PM  Result Value Ref Range   Sodium 137 135 - 145 mmol/L   Potassium 4.0 3.5 - 5.1 mmol/L   Chloride 99 98 - 111 mmol/L   CO2 28 22 - 32 mmol/L   Glucose, Bld 141 (H) 70 - 99 mg/dL    Comment: Glucose reference range applies only to samples taken after fasting for at least 8 hours.   BUN 18 6 - 20 mg/dL   Creatinine, Ser 1.03 0.61 - 1.24 mg/dL   Calcium 9.0 8.9 - 10.3 mg/dL   GFR calc non Af Amer >60 >60 mL/min   GFR calc Af Amer >60 >60 mL/min   Anion gap 10 5 - 15    Comment: Performed at [facility] [address], [place] [address]  CBC     Status: None   Collection Time: 07/04/19 12:25 PM  Result Value Ref Range   WBC 6.1 4.0 - 10.5 K/uL   RBC 4.74 4.22 - 5.81 MIL/uL   Hemoglobin 15.0 13.0 - 17.0 g/dL   HCT 45.0 39.0 - 52.0 %   MCV 94.9 80.0 - 100.0 fL   MCH 31.6 26.0 - 34.0 pg   MCHC 33.3 30.0 - 36.0 g/dL   RDW 12.6 11.5 - 15.5 %   Platelets 256 150 -  400 K/uL   nRBC 0.0 0.0 - 0.2 %    Comment: Performed at [facility], [place] [address], [place], [place] [address]  Hemoglobin A1c     Status: Abnormal   Collection Time: 07/04/19 12:25 PM  Result Value Ref Range   Hgb A1c MFr Bld 6.7 (H) 4.8 - 5.6 %    Comment: (NOTE) Pre diabetes:          5.7%-6.4% Diabetes:              >6.4% Glycemic control for   <7.0% adults with diabetes    Mean Plasma Glucose 145.59 mg/dL    Comment: Performed at [facility] [address], [place], [place] [address]  SARS CORONAVIRUS 2 (TAT 6-24 HRS) Nasopharyngeal Nasopharyngeal Swab     Status: None   Collection Time: 07/04/19  1:16 PM   Specimen: Nasopharyngeal Swab  Result Value Ref Range   SARS Coronavirus 2 NEGATIVE NEGATIVE    Comment: (NOTE) SARS-CoV-2 target nucleic acids are NOT DETECTED. The SARS-CoV-2 RNA is generally detectable in upper and lower respiratory  specimens during the acute phase of infection. Negative results do not preclude SARS-CoV-2 infection, do not rule out co-infections with other pathogens, and should not be used as the sole basis for treatment or other patient management decisions. Negative results must be combined with clinical observations, patient history, and epidemiological information. The expected result is Negative. Fact Sheet for Patients: SugarRoll.be Fact Sheet for Healthcare Providers: https://www.woods-mathews.com/ This test is not yet approved or cleared by the [place] FDA and  has been authorized for detection and/or diagnosis of SARS-CoV-2 by FDA under an Emergency Use Authorization (EUA). This EUA will remain  in effect (meaning this test can be used) for the duration of the COVID-19 declaration under Section 56 4(b)(1) of the Act, 21 U.S.C. section 360bbb-3(b)(1), unless the authorization is terminated or revoked sooner. Performed at [facility], [place] [address], [place], [place] [address]   Glucose, capillary     Status: Abnormal   Collection Time: 07/08/19  9:37 AM  Result Value Ref Range   Glucose-Capillary 141 (H) 70 - 99 mg/dL    Comment: Glucose reference range applies only to samples taken after fasting for at least 8 hours.  Surgical pathology     Status: None   Collection Time: 07/08/19 12:20 PM  Result Value Ref Range   SURGICAL PATHOLOGY      SURGICAL PATHOLOGY CASE: [id number] PATIENT: [name] Surgical Pathology Report     Clinical History: left knee Baker's cyst (cm)   FINAL MICROSCOPIC DIAGNOSIS:  A. BAKER'S CYST, LEFT KNEE, EXCISION: -  Benign synovium and fibroadipose tissue with chronic inflammation   GROSS DESCRIPTION:  Received fresh is a 5.7 x 4.3 x 1.1 cm focally disrupted cystic tissue which has pink-white to hyperemic external surface with scattered, adherent scant fatty tissue, and the inner surface  is pink-white, varies from smooth and glistening to granular, and the wall is up to 0.6 cm thick.  Representative sections in one block.  SW 07/08/2019    Final Diagnosis performed by Thressa Sheller, MD.   Electronically signed 07/09/2019 Technical and / or Professional components performed at [facility]. [facility], [place] [address], [place], [place] [address].  Immunohistochemistry Technical component (if applicable) was performed at [facility]. [address], [place], [place], [place] [address].   IMMUNOHISTOCHEMISTRY DISCLAIMER (if applicable): Some of these immunohistochemical stains may have  been developed and the performance characteristics determine by [facility]. Some may not have been cleared or approved by the U.S. Food and Drug Administration. The FDA has determined that such clearance or approval is not necessary. This test is used for clinical purposes. It should not be regarded as investigational or for research. This laboratory is certified under the [place] (CLIA-88) as qualified to perform high complexity clinical laboratory testing.  The controls stained appropriately.   Glucose, capillary     Status: Abnormal   Collection Time: 07/08/19  1:01 PM  Result Value Ref Range   Glucose-Capillary 117 (H) 70 - 99 mg/dL    Comment: Glucose reference range applies only to samples taken after fasting for at least 8 hours.      Psychiatric Specialty Exam: Physical Exam  Review of Systems  There were no vitals taken for this visit.There is no height or weight on file to calculate BMI.  General Appearance: NA  Eye Contact:  NA  Speech:  Slow  Volume:  Normal  Mood:  Dysphoric  Affect:  NA  Thought Process:  Descriptions of Associations: Intact  Orientation:  Full (Time, Place, and Person)  Thought Content:  Rumination  Suicidal Thoughts:  No  Homicidal Thoughts:  No  Memory:  Immediate;    Good Recent;   Fair Remote;   Fair  Judgement:  Intact  Insight:  Present  Psychomotor Activity:  NA  Concentration:  Concentration: Fair and Attention Span: Fair  Recall:  [employer] of Knowledge:  Good  Language:  Good  Akathisia:  No  Handed:  Right  AIMS (if indicated):     Assets:  Communication Skills Desire for Improvement Housing Social Support  ADL's:  Intact  Cognition:  WNL  Sleep:   fair      Assessment and Plan: Major depressive disorder, recurrent.  Generalized anxiety disorder.  Patient is no longer taking Cymbalta and now taking Wellbutrin XL 300 mg daily.  He is also taking BuSpar 15 mg twice a day, trazodone 100 mg at bedtime and Abilify 5 mg daily.  He is also taking Klonopin his PCP.  We discussed polypharmacy but patient feels that current medication regimen is doing much better and does not want to lower the dose.  We discussed medication side effects and benefits.  Patient is hoping to stop his pain medicine after 2 days which was prescribed for his recent knee surgery.  Encouraged to keep therapy.  Recommended to call us back if is any question of any concern.  Follow-up in 3 months.  Follow Up Instructions:    I discussed the assessment and treatment plan with the patient. The patient was provided an opportunity to ask questions and all were answered. The patient agreed with the plan and demonstrated an understanding of the instructions.   The patient was advised to call back or seek an in-person evaluation if the symptoms worsen or if the condition fails to improve as anticipated.  I provided 20 minutes of non-face-to-face time during this encounter.   Kathlee Nations, MD

## 2019-08-06 ENCOUNTER — Other Ambulatory Visit: Payer: Self-pay

## 2019-08-06 ENCOUNTER — Ambulatory Visit (INDEPENDENT_AMBULATORY_CARE_PROVIDER_SITE_OTHER): Payer: 59 | Admitting: Orthopedic Surgery

## 2019-08-06 DIAGNOSIS — M7120 Synovial cyst of popliteal space [Baker], unspecified knee: Secondary | ICD-10-CM

## 2019-08-08 ENCOUNTER — Encounter: Payer: Self-pay | Admitting: Orthopedic Surgery

## 2019-08-08 NOTE — Progress Notes (Signed)
Post-Op Visit Note   Patient: [name]           Date of Birth: 10/20/1965           MRN: [name] Visit Date: 08/06/2019 PCP: [name], MD   Assessment & Plan:  Chief Complaint:  Chief Complaint  Patient presents with  . Left Knee - Follow-up   Visit Diagnoses:  1. Synovial cyst of popliteal space, unspecified laterality     Plan: [place] is now about a month out left knee Baker cyst excision.  Does have some pain with prolonged standing.  Ultrasound negative for DVT.  Taking aspirin 81 mg a day.  On exam his gait is better today.  Slight effusion in the knee.  The incision is intact.  Does have some postop swelling around the incision but no evidence of cyst recurrence.  Plan at this time is to continue with activities of daily living along with leg strengthening and range of motion exercises.  Follow-up with me in 4 weeks for clinical recheck.  Follow-Up Instructions: No follow-ups on file.   Orders:  No orders of the defined types were placed in this encounter.  No orders of the defined types were placed in this encounter.   Imaging: No results found.  PMFS History: Patient Active Problem List   Diagnosis Date Noted  . Radiculopathy, cervical region 06/10/2019  . Carpal tunnel syndrome, left upper limb 06/10/2019  . Decreased GFR 01/09/2019  . Benzodiazepine withdrawal with complication ([place]) [id number]  . 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 Spinal stenosis of cervical region 12/31/2018  . Loose body of left knee   . Acute medial meniscus tear of left knee   . Plantar fasciitis of left foot 08/24/2017  . Burning sensation of feet 06/20/2017  . Chronic pain of both hips 06/20/2017  . Angioedema of lips 11/26/2016  . Cognitive complaints with normal neuropsychological exam 10/23/2016  . 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  Hypothyroidism due to acquired atrophy of thyroid 09/14/2015  . Arthritis of knee 06/23/2014  . Encounter for preventive health examination 06/17/2014  . Enlarged RV (right ventricle) 06/17/2014  . GERD (gastroesophageal reflux disease) 04/20/2014  . Cervical spine degeneration 02/17/2014  . Overweight 10/29/2013  . Chronic knee pain 10/29/2013  . Diabetes mellitus type 2 in obese ([place]) 10/23/2012  . Seborrheic dermatitis, unspecified 05/17/2012  . Fatty liver 04/22/2012  . Hypogonadism male 03/12/2012  . Sciatica of right side 12/24/2010  . Hyperlipidemia associated with type 2 diabetes mellitus ([place]) 01/13/2009  . Anxiety state 01/13/2009  . Essential hypertension 01/13/2009  . OSA (obstructive sleep apnea) 01/13/2009   Past Medical History:  Diagnosis Date  . Alcohol-induced chronic pancreatitis ([place]) 01/09/2019  . Anxiety   . Anxiety and depression   . Arthritis    knees and shoulders  . Bruit    L  . Chest pain    hx  . Diabetes mellitus without complication ([place])    "borderline", diet controlled, no meds, patient has lost 30 lbs  . Edema   . Fatty liver   . GERD (gastroesophageal reflux disease)    uses Omeprazole  . Goiter   . HLD (hyperlipidemia)   . Hypertension    essential, benign  . Hypothyroidism   . Impotence of organic origin   . Murmur    never has caused any problems  .  Neuromuscular disorder ([place])   . Other chest pain    tightness, pressure  . Palpitation    hx  . Precordial pain   . Sleep apnea    uses cpap    Family History  Problem Relation Age of Onset  . Heart disease Father     Past Surgical History:  Procedure Laterality Date  . APPENDECTOMY  2002   done at [facility]  . CARDIAC CATHETERIZATION    . CARDIAC CATHETERIZATION N/A 08/27/2015   Procedure: Left Heart Cath and Coronary Angiography;  Surgeon: Jolaine Artist, MD;  Location: [facility];  Service: Cardiovascular;  Laterality: N/A;  . COLONOSCOPY    . EAR CYST EXCISION  Left 07/08/2019   Procedure: open excision baker's cyst left knee;  Surgeon: Meredith Pel, MD;  Location: [facility];  Service: Orthopedics;  Laterality: Left;  . HERNIA REPAIR  [id number]   umbilical  . JOINT REPLACEMENT     right knee  . KNEE ARTHROSCOPY Left 12/17/2018   Procedure: left knee arthroscopy, meniscal debridement, loose body removal;  Surgeon: Meredith Pel, MD;  Location: [facility];  Service: Orthopedics;  Laterality: Left;  . REVISION TOTAL KNEE ARTHROPLASTY Right 06/23/2014   DR Marlou Sa  . TONSILLECTOMY    . TOTAL KNEE ARTHROPLASTY Right 2011   right  . TOTAL KNEE REVISION Right 06/23/2014   Procedure: TOTAL KNEE REVISION;  Surgeon: Meredith Pel, MD;  Location: [facility];  Service: Orthopedics;  Laterality: Right;  . WISDOM TOOTH EXTRACTION     Social History   Occupational History  . Occupation: IT trainer  . Occupation: SERVICE DIRECTOR    Employer: [employer]  Tobacco Use  . Smoking status: Former Smoker    Packs/day: 2.00    Years: 18.00    Pack years: 36.00    Types: Cigarettes    Quit date: 04/24/2005    Years since quitting: 14.2  . Smokeless tobacco: Never Used  . Tobacco comment: quit 20 years ago   Substance and Sexual Activity  . Alcohol use: Yes    Alcohol/week: 8.0 - 9.0 standard drinks    Types: 6 Cans of beer, 2 - 3 Shots of liquor per week    Comment: beer/liquor  . Drug use: No  . Sexual activity: Yes

## 2019-08-12 ENCOUNTER — Other Ambulatory Visit (HOSPITAL_COMMUNITY): Payer: Self-pay | Admitting: Psychiatry

## 2019-08-12 ENCOUNTER — Other Ambulatory Visit: Payer: Self-pay | Admitting: Internal Medicine

## 2019-08-12 DIAGNOSIS — F411 Generalized anxiety disorder: Secondary | ICD-10-CM

## 2019-08-12 NOTE — Telephone Encounter (Signed)
Pt is scheduled for a follow up on 08/26/2019. Pt is aware of appt date and time.

## 2019-08-12 NOTE — Telephone Encounter (Signed)
Refill request for Klonopin, last seen 01-08-19, last filled 07-09-19.  Please advise.

## 2019-08-12 NOTE — Telephone Encounter (Signed)
HE HAS ALREADY RECEIVED A NOTIFICATION THA THE NEEDS AN OFFICE VISIT.  LET HIM KNOW THAT  I WILL Courtesy refill for 30 days,   NEEDS APPOINTMENT OR NO MORE REFILLS

## 2019-08-26 ENCOUNTER — Telehealth (INDEPENDENT_AMBULATORY_CARE_PROVIDER_SITE_OTHER): Payer: 59 | Admitting: Internal Medicine

## 2019-08-26 ENCOUNTER — Encounter: Payer: Self-pay | Admitting: Internal Medicine

## 2019-08-26 DIAGNOSIS — I10 Essential (primary) hypertension: Secondary | ICD-10-CM

## 2019-08-26 DIAGNOSIS — E1169 Type 2 diabetes mellitus with other specified complication: Secondary | ICD-10-CM

## 2019-08-26 DIAGNOSIS — F331 Major depressive disorder, recurrent, moderate: Secondary | ICD-10-CM | POA: Diagnosis not present

## 2019-08-26 DIAGNOSIS — E1121 Type 2 diabetes mellitus with diabetic nephropathy: Secondary | ICD-10-CM | POA: Insufficient documentation

## 2019-08-26 DIAGNOSIS — F411 Generalized anxiety disorder: Secondary | ICD-10-CM | POA: Diagnosis not present

## 2019-08-26 DIAGNOSIS — E785 Hyperlipidemia, unspecified: Secondary | ICD-10-CM

## 2019-08-26 DIAGNOSIS — E669 Obesity, unspecified: Secondary | ICD-10-CM

## 2019-08-26 MED ORDER — MELOXICAM 15 MG PO TABS: 15.0000 mg | ORAL_TABLET | Freq: Every day | ORAL | 1 refills | Status: DC

## 2019-08-26 MED ORDER — OMEPRAZOLE 40 MG PO CPDR: 40.0000 mg | DELAYED_RELEASE_CAPSULE | Freq: Two times a day (BID) | ORAL | 1 refills | Status: DC

## 2019-08-26 MED ORDER — METOPROLOL TARTRATE 25 MG PO TABS: 25.0000 mg | ORAL_TABLET | Freq: Two times a day (BID) | ORAL | 1 refills | Status: DC

## 2019-08-26 MED ORDER — GABAPENTIN 600 MG PO TABS: 600.0000 mg | ORAL_TABLET | Freq: Three times a day (TID) | ORAL | 0 refills | Status: DC

## 2019-08-26 MED ORDER — BUSPIRONE HCL 15 MG PO TABS: 15.0000 mg | ORAL_TABLET | Freq: Three times a day (TID) | ORAL | 2 refills | Status: DC

## 2019-08-26 MED ORDER — PROMETHAZINE HCL 12.5 MG PO TABS: 12.5000 mg | ORAL_TABLET | Freq: Three times a day (TID) | ORAL | 0 refills | Status: DC | PRN

## 2019-08-26 MED ORDER — LEVOTHYROXINE SODIUM 88 MCG PO TABS: ORAL_TABLET | ORAL | 1 refills | Status: DC

## 2019-08-26 MED ORDER — CLONAZEPAM 1 MG PO TABS: ORAL_TABLET | ORAL | 5 refills | Status: DC

## 2019-08-26 MED ORDER — TRAZODONE HCL 100 MG PO TABS: 150.0000 mg | ORAL_TABLET | Freq: Every day | ORAL | 2 refills | Status: DC

## 2019-08-26 NOTE — Assessment & Plan Note (Signed)
Diagnosed in Oct 2014 with a1c of 7.8.  Has been  well-controlled on diet alone  .   Patient is overdue for follow up and reminded to schedule an annual eye exam .  Patient has recently developed  microalbuminuria. Patient is tolerating statin therapy for CAD risk reduction and on ACE/ARB for renal protection and hypertension.   Lab Results  Component Value Date   HGBA1C 6.7 (H) 07/04/2019   Lab Results  Component Value Date   MICROALBUR 47.1 (H) 01/09/2019   MICROALBUR 0.8 10/16/2017

## 2019-08-26 NOTE — Progress Notes (Signed)
Virtual Visit via [facility]  This visit type was conducted due to national recommendations for restrictions regarding the COVID-19 pandemic (e.g. social distancing).  This format is felt to be most appropriate for this patient at this time.  All issues noted in this document were discussed and addressed.  No physical exam was performed (except for noted visual exam findings with Video Visits).   I connected with@ on 08/26/19 at  2:00 PM EDT by a video enabled telemedicine application  and verified that I am speaking with the correct person using two identifiers. Location patient: home Location provider: work or home office Persons participating in the virtual visit: patient, provider  I discussed the limitations, risks, security and privacy concerns of performing an evaluation and management service by telephone and the availability of in person appointments. I also discussed with the patient that there may be a patient responsible charge related to this service. The patient expressed understanding and agreed to proceed.  Reason for visit:  Follow up on multiple issues  HPI:  54 yr old male with T2DM, fatty liver, GAD/depression, chronic shoulder and knee pain, disabled by orthopedic issues.  S/p Baker's Cyst removal 6 weeks ago.  Severe financial difficulties,  Wife [name] has been waiting for a heart transplant and suffered a hemorrhagic stroke  In December.  They have lost their house and are living in a camper.  They have filed for medicaid.    Knee pain has improved since surgery .  Neuropathy has worsened due to gabapentin directions changed erroneously to bid, (was taking it tid previously) Energy level is better since his psychiatrist increased his wellbutrin to 300 mg , but he is More anxious,  Waking up early  Discussed increasing trazodone to 150 mg,  increasing buspirone to 22.5 mg bid or 15 mg tid   ROS: See pertinent positives and negatives per HPI.  Past Medical History:   Diagnosis Date  . Alcohol-induced chronic pancreatitis ([place]) 01/09/2019  . Anxiety   . Anxiety and depression   . Arthritis    knees and shoulders  . Benzodiazepine withdrawal with complication ([place]) [id number]  . Bruit    L  . Chest pain    hx  . Cognitive complaints with normal neuropsychological exam 10/23/2016  . Diabetes mellitus without complication ([place])    "borderline", diet controlled, no meds, patient has lost 30 lbs  . Edema   . Fatty liver   . GERD (gastroesophageal reflux disease)    uses Omeprazole  . Goiter   . HLD (hyperlipidemia)   . Hypertension    essential, benign  . Hypothyroidism   . Impotence of organic origin   . Murmur    never has caused any problems  . Neuromuscular disorder ([place])   . Other chest pain    tightness, pressure  . Palpitation    hx  . Precordial pain   . Sleep apnea    uses cpap    Past Surgical History:  Procedure Laterality Date  . APPENDECTOMY  2002   done at [facility]  . CARDIAC CATHETERIZATION    . CARDIAC CATHETERIZATION N/A 08/27/2015   Procedure: Left Heart Cath and Coronary Angiography;  Surgeon: Jolaine Artist, MD;  Location: [place] [facility];  Service: Cardiovascular;  Laterality: N/A;  . COLONOSCOPY    . EAR CYST EXCISION Left 07/08/2019   Procedure: open excision baker's cyst left knee;  Surgeon: Meredith Pel, MD;  Location: [place];  Service: Orthopedics;  Laterality: Left;  .  HERNIA REPAIR  [id number]   umbilical  . JOINT REPLACEMENT     right knee  . KNEE ARTHROSCOPY Left 12/17/2018   Procedure: left knee arthroscopy, meniscal debridement, loose body removal;  Surgeon: Meredith Pel, MD;  Location: [place];  Service: Orthopedics;  Laterality: Left;  . REVISION TOTAL KNEE ARTHROPLASTY Right 06/23/2014   DR Marlou Sa  . TONSILLECTOMY    . TOTAL KNEE ARTHROPLASTY Right 2011   right  . TOTAL KNEE REVISION Right 06/23/2014   Procedure: TOTAL KNEE REVISION;  Surgeon: Meredith Pel, MD;  Location: [place];   Service: Orthopedics;  Laterality: Right;  . WISDOM TOOTH EXTRACTION      Family History  Problem Relation Age of Onset  . Heart disease Father     SOCIAL HX:  reports that he quit smoking about 14 years ago. His smoking use included cigarettes. He has a 36.00 pack-year smoking history. He has never used smokeless tobacco. He reports current alcohol use of about 8.0 - 9.0 standard drinks of alcohol per week. He reports that he does not use drugs.   Current Outpatient Medications:  .  ARIPiprazole (ABILIFY) 5 MG tablet, Take 1 tablet (5 mg total) by mouth at bedtime., Disp: 30 tablet, Rfl: 2 .  buPROPion (WELLBUTRIN XL) 300 MG 24 hr tablet, Take 1 tablet (300 mg total) by mouth daily., Disp: 30 tablet, Rfl: 2 .  busPIRone (BUSPAR) 15 MG tablet, Take 1 tablet (15 mg total) by mouth 3 (three) times daily., Disp: 90 tablet, Rfl: 2 .  clobetasol (TEMOVATE) 0.05 % external solution, Apply 1 application topically 2 (two) times daily as needed (scalp irritation)., Disp: , Rfl:  .  Clobetasol Propionate 0.05 % shampoo, Apply to dry scalp and leave on for 15 minutes (Patient taking differently: Apply 1 application topically 2 (two) times a week. Apply to dry scalp and leave on for 15 minutes as needed for itching.), Disp: 118 mL, Rfl: 5 .  clonazePAM (KLONOPIN) 1 MG tablet, TAKE 1 TABLET BY MOUTH TWICE DAILY AS NEEDED FOR ANXIETY, Disp: 60 tablet, Rfl: 5 .  dicyclomine (BENTYL) 20 MG tablet, TAKE 1 TABLET (20 MG TOTAL) BY MOUTH EVERY 6 (SIX) HOURS. (Patient taking differently: Take 20 mg by mouth 4 (four) times daily -  before meals and at bedtime. ), Disp: 120 tablet, Rfl: 5 .  gabapentin (NEURONTIN) 600 MG tablet, Take 1 tablet (600 mg total) by mouth 3 (three) times daily., Disp: 270 tablet, Rfl: 0 .  levothyroxine (EUTHYROX) 88 MCG tablet, TAKE 1 TABLET BY MOUTH ONCE DAILY BEFORE BREAKFAST, Disp: 90 tablet, Rfl: 1 .  losartan-hydrochlorothiazide (HYZAAR) 100-25 MG tablet, Take 1 tablet by mouth  daily. (Patient taking differently: Take 1 tablet by mouth at bedtime. ), Disp: 90 tablet, Rfl: 3 .  meloxicam (MOBIC) 15 MG tablet, Take 1 tablet (15 mg total) by mouth daily., Disp: 90 tablet, Rfl: 1 .  methocarbamol (ROBAXIN) 500 MG tablet, Take 1 tablet (500 mg total) by mouth every 8 (eight) hours as needed., Disp: 30 tablet, Rfl: 0 .  metoprolol tartrate (LOPRESSOR) 25 MG tablet, Take 1 tablet (25 mg total) by mouth 2 (two) times daily., Disp: 180 tablet, Rfl: 1 .  Multiple Vitamin (MULTIVITAMIN WITH MINERALS) TABS tablet, Take 1 tablet by mouth daily., Disp: , Rfl:  .  omeprazole (PRILOSEC) 40 MG capsule, Take 1 capsule (40 mg total) by mouth in the morning and at bedtime., Disp: 180 capsule, Rfl: 1 .  promethazine (PHENERGAN) 12.5  MG tablet, Take 1 tablet (12.5 mg total) by mouth every 8 (eight) hours as needed for nausea or vomiting., Disp: 20 tablet, Rfl: 0 .  SUPER B COMPLEX/C PO, Take 1 tablet by mouth daily., Disp: , Rfl:  .  traZODone (DESYREL) 100 MG tablet, Take 1.5 tablets (150 mg total) by mouth at bedtime., Disp: 135 tablet, Rfl: 2  EXAM:  VITALS per patient if applicable:  GENERAL: alert, oriented, appears well and in no acute distress  HEENT: atraumatic, conjunttiva clear, no obvious abnormalities on inspection of external nose and ears  NECK: normal movements of the head and neck  LUNGS: on inspection no signs of respiratory distress, breathing rate appears normal, no obvious gross SOB, gasping or wheezing  CV: no obvious cyanosis  MS: moves all visible extremities without noticeable abnormality  PSYCH/NEURO: pleasant and cooperative, no obvious depression or anxiety, speech and thought processing grossly intact  ASSESSMENT AND PLAN:  Discussed the following assessment and plan:  MDD (major depressive disorder), recurrent episode, moderate (HCC) - Plan: busPIRone (BUSPAR) 15 MG tablet, traZODone (DESYREL) 100 MG tablet  GAD (generalized anxiety disorder) -  Plan: busPIRone (BUSPAR) 15 MG tablet, traZODone (DESYREL) 100 MG tablet  Diabetes mellitus type 2 in obese ([place])  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  Has been  well-controlled on diet alone  .   Patient is overdue for follow up and reminded to schedule an annual eye exam .  Patient has recently developed  microalbuminuria. Patient is tolerating statin therapy for CAD risk reduction and on ACE/ARB for renal protection and hypertension.   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 Continue ARB.  Lab Results  Component Value Date   MICROALBUR 47.1 (H) 01/09/2019   MICROALBUR 0.8 10/16/2017       Anxiety state Aggravated by his wife Tanya's recent hemorrhagic stroke  And financial hardship. Continue clonazepam. Increase buspirone to 15 mg tid. And trazodone 150 mg qhs  Essential hypertension Home readings have been under 130/80 when checked.  No changes today   Hyperlipidemia associated with type 2 diabetes mellitus Managed with rosuvastatin.  Direct LDL is at goal.   Lab Results  Component Value Date   CHOL 132 01/09/2019   HDL 33 (L) 01/09/2019   LDLCALC 44 01/09/2019   LDLDIRECT 101.0 10/16/2017   TRIG 277 (H) 01/09/2019   CHOLHDL 4.0 01/09/2019       I discussed the assessment and treatment plan with the patient. The patient was provided an opportunity to ask questions and all were answered. The patient agreed with the plan and demonstrated an understanding of the instructions.   The  patient was advised to call back or seek an in-person evaluation if the symptoms worsen or if the condition fails to improve as anticipated.  I provided  30 minutes of non-face-to-face time during this encounter reviewing patient's current problems and past surgeries, labs and imaging studies, providing counseling on the above mentioned problems , and coordination  of care .  Aris Everts  Derrel Nip, MD

## 2019-08-26 NOTE — Assessment & Plan Note (Signed)
Home readings have been under 130/80 when checked.  No changes today

## 2019-08-26 NOTE — Assessment & Plan Note (Addendum)
Most recent a1c was 6.7 on diet alone. Continue ARB.  Lab Results  Component Value Date   MICROALBUR 47.1 (H) 01/09/2019   MICROALBUR 0.8 10/16/2017

## 2019-08-26 NOTE — Assessment & Plan Note (Signed)
Managed with rosuvastatin.  Direct LDL is at goal.   Lab Results  Component Value Date   CHOL 132 01/09/2019   HDL 33 (L) 01/09/2019   LDLCALC 44 01/09/2019   LDLDIRECT 101.0 10/16/2017   TRIG 277 (H) 01/09/2019   CHOLHDL 4.0 01/09/2019

## 2019-08-26 NOTE — Assessment & Plan Note (Signed)
Aggravated by his wife Tanya's recent hemorrhagic stroke  And financial hardship. Continue clonazepam. Increase buspirone to 15 mg tid. And trazodone 150 mg qhs

## 2019-09-02 ENCOUNTER — Telehealth (HOSPITAL_COMMUNITY): Payer: Self-pay | Admitting: Licensed Clinical Social Worker

## 2019-09-02 ENCOUNTER — Ambulatory Visit (HOSPITAL_COMMUNITY): Payer: 59 | Admitting: Licensed Clinical Social Worker

## 2019-09-02 ENCOUNTER — Other Ambulatory Visit: Payer: Self-pay

## 2019-09-02 NOTE — Telephone Encounter (Signed)
Spoke with patient who agreed to contact insurance company to ensure we are in network and will speak during next scheduled session on 09/23/19.

## 2019-09-03 ENCOUNTER — Ambulatory Visit (INDEPENDENT_AMBULATORY_CARE_PROVIDER_SITE_OTHER): Payer: 59 | Admitting: Orthopedic Surgery

## 2019-09-03 ENCOUNTER — Other Ambulatory Visit: Payer: Self-pay

## 2019-09-03 DIAGNOSIS — M7122 Synovial cyst of popliteal space [Baker], left knee: Secondary | ICD-10-CM

## 2019-09-04 ENCOUNTER — Other Ambulatory Visit: Payer: Self-pay

## 2019-09-04 MED ORDER — METHOCARBAMOL 500 MG PO TABS: 500.0000 mg | ORAL_TABLET | Freq: Three times a day (TID) | ORAL | 11 refills | Status: DC | PRN

## 2019-09-04 MED ORDER — METHOCARBAMOL 500 MG PO TABS: 500.0000 mg | ORAL_TABLET | Freq: Three times a day (TID) | ORAL | 0 refills | Status: DC | PRN

## 2019-09-04 NOTE — Telephone Encounter (Signed)
Ok to refill 

## 2019-09-06 ENCOUNTER — Encounter: Payer: Self-pay | Admitting: Orthopedic Surgery

## 2019-09-06 DIAGNOSIS — M7122 Synovial cyst of popliteal space [Baker], left knee: Secondary | ICD-10-CM | POA: Diagnosis not present

## 2019-09-06 MED ORDER — METHYLPREDNISOLONE ACETATE 40 MG/ML IJ SUSP
40.0000 mg | INTRAMUSCULAR | Status: AC | PRN
  Administered 2019-09-06: 40 mg via INTRA_ARTICULAR

## 2019-09-06 MED ORDER — BUPIVACAINE HCL 0.25 % IJ SOLN
4.0000 mL | INTRAMUSCULAR | Status: AC | PRN
  Administered 2019-09-06: 4 mL via INTRA_ARTICULAR

## 2019-09-06 MED ORDER — LIDOCAINE HCL 1 % IJ SOLN
5.0000 mL | INTRAMUSCULAR | Status: AC | PRN
  Administered 2019-09-06: 5 mL

## 2019-09-06 NOTE — Progress Notes (Signed)
Post-Op Visit Note   Patient: [name]           Date of Birth: 1965/12/15           MRN: [id number] Visit Date: 09/03/2019 PCP: [name], MD   Assessment & Plan:  Chief Complaint:  Chief Complaint  Patient presents with  . Left Knee - Follow-up   Visit Diagnoses:  1. Baker's cyst of knee, left     Plan: [place] is now about 2 months out left knee Baker cyst excision.  Doing better but he is has had some recurrent swelling in the knee as well as in that posterior Baker's cyst region.  No fevers.  On exam he is got good range of motion with mild to moderate effusion in the knee as well as some recurrence of fluid in that knee region.  No definite cystic structure but there is some fluid in the back of the knee.  The knee is aspirated and injected today.  Also aspirated under ultrasound guidance that cyst.  Compression applied.  Will need to be careful with activity in order to keep the left knee from producing a lot of intra-articular fluid which could cause that Baker's cyst to recur.  I will see him back as needed.   Procedure Note  Patient: [name]             Date of Birth: May 27, 1965           MRN: [id number]             Visit Date: 09/03/2019  Procedures: Visit Diagnoses:  1. Baker's cyst of knee, left     Large Joint Inj: L knee on 09/06/2019 6:39 PM Indications: diagnostic evaluation, joint swelling and pain Details: 18 G 1.5 in needle, superolateral approach  Arthrogram: No  Medications: 5 mL lidocaine 1 %; 40 mg methylPREDNISolone acetate 40 MG/ML; 4 mL bupivacaine 0.25 % Outcome: tolerated well, no immediate complications Procedure, treatment alternatives, risks and benefits explained, specific risks discussed. Consent was given by the patient. Immediately prior to procedure a time out was called to verify the correct patient, procedure, equipment, support staff and site/side marked as required. Patient was prepped and draped in the usual  sterile fashion.    20 cc aspirated clear.    Follow-Up Instructions: No follow-ups on file.   Orders:  No orders of the defined types were placed in this encounter.  No orders of the defined types were placed in this encounter.   Imaging: No results found.  PMFS History: Patient Active Problem List   Diagnosis Date Noted  . 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 Radiculopathy, cervical region 06/10/2019  . Carpal tunnel syndrome, left upper limb 06/10/2019  . 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 Spinal stenosis of cervical region 12/31/2018  . Loose body of left knee   . Acute medial meniscus tear of left knee   . Plantar fasciitis of left foot 08/24/2017  . Chronic pain of both hips 06/20/2017  . Angioedema of lips 11/26/2016  . 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 Hypothyroidism due to acquired atrophy of thyroid 09/14/2015  . Arthritis of knee 06/23/2014  . Encounter for preventive health examination 06/17/2014  . Enlarged RV (right ventricle) 06/17/2014  . GERD (gastroesophageal reflux disease) 04/20/2014  . Cervical spine degeneration 02/17/2014  .  Overweight 10/29/2013  . Chronic knee pain 10/29/2013  . Diabetes mellitus type 2 in obese ([place]) 10/23/2012  . Seborrheic dermatitis, unspecified 05/17/2012  . Fatty liver 04/22/2012  . Hypogonadism male 03/12/2012  . Sciatica of right side 12/24/2010  . Hyperlipidemia associated with type 2 diabetes mellitus ([place]) 01/13/2009  . Anxiety state 01/13/2009  . Essential hypertension 01/13/2009  . OSA (obstructive sleep apnea) 01/13/2009   Past Medical History:  Diagnosis Date  . Alcohol-induced chronic pancreatitis ([place]) 01/09/2019  . Anxiety   . Anxiety and depression   . Arthritis    knees and shoulders  . Benzodiazepine withdrawal  with complication ([place]) [id number]  . Bruit    L  . Chest pain    hx  . Cognitive complaints with normal neuropsychological exam 10/23/2016  . Diabetes mellitus without complication ([place])    "borderline", diet controlled, no meds, patient has lost 30 lbs  . Edema   . Fatty liver   . GERD (gastroesophageal reflux disease)    uses Omeprazole  . Goiter   . HLD (hyperlipidemia)   . Hypertension    essential, benign  . Hypothyroidism   . Impotence of organic origin   . Murmur    never has caused any problems  . Neuromuscular disorder ([place])   . Other chest pain    tightness, pressure  . Palpitation    hx  . Precordial pain   . Sleep apnea    uses cpap    Family History  Problem Relation Age of Onset  . Heart disease Father     Past Surgical History:  Procedure Laterality Date  . APPENDECTOMY  2002   [facility] at [facility]  . CARDIAC CATHETERIZATION    . CARDIAC CATHETERIZATION N/A 08/27/2015   Procedure: Left Heart Cath and Coronary Angiography;  Surgeon: Jolaine Artist, MD;  Location: [place] [facility];  Service: Cardiovascular;  Laterality: N/A;  . COLONOSCOPY    . EAR CYST EXCISION Left 07/08/2019   Procedure: open excision baker's cyst left knee;  Surgeon: Meredith Pel, MD;  Location: [place];  Service: Orthopedics;  Laterality: Left;  . HERNIA REPAIR  [id number]   umbilical  . JOINT REPLACEMENT     right knee  . KNEE ARTHROSCOPY Left 12/17/2018   Procedure: left knee arthroscopy, meniscal debridement, loose body removal;  Surgeon: Meredith Pel, MD;  Location: [place];  Service: Orthopedics;  Laterality: Left;  . REVISION TOTAL KNEE ARTHROPLASTY Right 06/23/2014   DR Marlou Sa  . TONSILLECTOMY    . TOTAL KNEE ARTHROPLASTY Right 2011   right  . TOTAL KNEE REVISION Right 06/23/2014   Procedure: TOTAL KNEE REVISION;  Surgeon: Meredith Pel, MD;  Location: [place];  Service: Orthopedics;  Laterality: Right;  . WISDOM TOOTH EXTRACTION     Social History   Occupational  History  . Occupation: IT trainer  . Occupation: SERVICE DIRECTOR    Employer: [employer]  Tobacco Use  . Smoking status: Former Smoker    Packs/day: 2.00    Years: 18.00    Pack years: 36.00    Types: Cigarettes    Quit date: 04/24/2005    Years since quitting: 14.3  . Smokeless tobacco: Never Used  . Tobacco comment: quit 20 years ago   Substance and Sexual Activity  . Alcohol use: Yes    Alcohol/week: 8.0 - 9.0 standard drinks    Types: 6 Cans of beer, 2 -  3 Shots of liquor per week    Comment: beer/liquor  . Drug use: No  . Sexual activity: Yes

## 2019-09-10 ENCOUNTER — Encounter: Payer: Self-pay | Admitting: Orthopedic Surgery

## 2019-09-10 ENCOUNTER — Ambulatory Visit (INDEPENDENT_AMBULATORY_CARE_PROVIDER_SITE_OTHER): Payer: 59 | Admitting: Pulmonary Disease

## 2019-09-10 ENCOUNTER — Encounter: Payer: Self-pay | Admitting: Pulmonary Disease

## 2019-09-10 ENCOUNTER — Other Ambulatory Visit: Payer: Self-pay

## 2019-09-10 ENCOUNTER — Ambulatory Visit (INDEPENDENT_AMBULATORY_CARE_PROVIDER_SITE_OTHER): Payer: 59 | Admitting: Orthopedic Surgery

## 2019-09-10 VITALS — BP 134/70 | HR 66 | Temp 98.3°F | Ht 72.0 in | Wt 233.2 lb

## 2019-09-10 VITALS — Ht 72.0 in | Wt 222.0 lb

## 2019-09-10 DIAGNOSIS — Z7189 Other specified counseling: Secondary | ICD-10-CM

## 2019-09-10 DIAGNOSIS — G4733 Obstructive sleep apnea (adult) (pediatric): Secondary | ICD-10-CM

## 2019-09-10 DIAGNOSIS — M7122 Synovial cyst of popliteal space [Baker], left knee: Secondary | ICD-10-CM

## 2019-09-10 MED ORDER — CELECOXIB 100 MG PO CAPS
ORAL_CAPSULE | ORAL | 0 refills | Status: DC
Start: 2019-09-10 — End: 2019-10-28

## 2019-09-10 NOTE — Patient Instructions (Signed)
Will arrange for CPAP titration study in sleep lab  Follow up in 3 months

## 2019-09-10 NOTE — Progress Notes (Signed)
[facility]  Chief Complaint  Patient presents with  . Follow-up    Constitutional:  BP 134/70 (BP Location: Right Arm, Cuff Size: Normal)   Pulse 66   Temp 98.3 F (36.8 C) (Temporal)   Ht 6' (1.829 m)   Wt 233 lb 3.2 oz (105.8 kg)   SpO2 98% Comment: RA  BMI 31.63 kg/m   Past Medical History:  Chronic pancreatitis, Anxiety, Depression, OA, DM, Fatty liver, GERD, Goiter, HLD, HTN, Hypothyroidism  Summary:  [name] is a 54 y.o. male former smoker with obstructive sleep apnea.  Subjective:   Last saw him in 2015.  Most recently seen by Dr. Ashby Dawes.  He is having more trouble staying asleep.  Goes to bed at 10 pm.  Falls asleep in 30 minutes after taking trazodone.  Wakes up in 2 hours and feels like he can't breath.  Takes off CPAP and goes back to sleep.  Feels tired during the day.  His machine is several years old.   Physical Exam:   Appearance - well kempt  ENMT - no sinus tenderness, no nasal discharge, no oral exudate, Mallampati 3  Respiratory - no wheeze, or rales  CV - regular rate and rhythm, no murmurs  GI - soft, non tender  Lymph - no adenopathy noted in neck  Ext - no edema  Skin - no rashes  Neuro - normal strength, oriented x 3  Psych - normal mood and affect   Assessment/Plan:   Obstructive sleep apnea. - he has been on auto CPAP, but having trouble staying asleep - will arrange for in lab CPAP titration study - will likely also need to arrange for new CPAP machine since his device is more than 55 yrs old and likely not working or amenable to repair  COVID advice. - discussed pros/cons of COVID vaccines - he will get either Pfizer or Moderna vaccine  A total of 26 minutes addressing patient care on the day of the visit.  Follow up:  Patient Instructions  Will arrange for CPAP titration study in sleep lab  Follow up in 3 months   Signature:  Chesley Mires, MD [place] Pager: [phone] 09/10/2019, 10:45 AM  Flow Sheet    Sleep tests:   PSG 01/21/07 >> AHI 18, SpO2 low 56%. CPAP 7 cm H2O>>AHI 2.6  Medications:   Allergies as of 09/10/2019      Reactions   Ambien [zolpidem] Swelling, Other (See Comments)   Swelling in the throat      Medication List       Accurate as of Sep 10, 2019 10:45 AM. If you have any questions, ask your nurse or doctor.        ARIPiprazole 5 MG tablet Commonly known as: ABILIFY Take 1 tablet (5 mg total) by mouth at bedtime.   buPROPion 300 MG 24 hr tablet Commonly known as: Wellbutrin XL Take 1 tablet (300 mg total) by mouth daily.   busPIRone 15 MG tablet Commonly known as: BUSPAR Take 1 tablet (15 mg total) by mouth 3 (three) times daily.   clobetasol 0.05 % external solution Commonly known as: TEMOVATE Apply 1 application topically 2 (two) times daily as needed (scalp irritation). What changed: Another medication with the same name was changed. Make sure you understand how and when to take each.   Clobetasol Propionate 0.05 % shampoo Apply to dry scalp and leave on for 15 minutes What changed:  how much to take  how to take this  when to take this  additional instructions   clonazePAM 1 MG tablet Commonly known as: KLONOPIN TAKE 1 TABLET BY MOUTH TWICE DAILY AS NEEDED FOR ANXIETY   dicyclomine 20 MG tablet Commonly known as: BENTYL TAKE 1 TABLET (20 MG TOTAL) BY MOUTH EVERY 6 (SIX) HOURS. What changed: when to take this   gabapentin 600 MG tablet Commonly known as: NEURONTIN Take 1 tablet (600 mg total) by mouth 3 (three) times daily.   levothyroxine 88 MCG tablet Commonly known as: Euthyrox TAKE 1 TABLET BY MOUTH ONCE DAILY BEFORE BREAKFAST   losartan-hydrochlorothiazide 100-25 MG tablet Commonly known as: HYZAAR Take 1 tablet by mouth daily. What changed: when to take this   meloxicam 15 MG tablet Commonly known as: MOBIC Take 1 tablet (15 mg total)  by mouth daily.   methocarbamol 500 MG tablet Commonly known as: ROBAXIN Take 1 tablet (500 mg total) by mouth every 8 (eight) hours as needed.   metoprolol tartrate 25 MG tablet Commonly known as: LOPRESSOR Take 1 tablet (25 mg total) by mouth 2 (two) times daily.   multivitamin with minerals Tabs tablet Take 1 tablet by mouth daily.   omeprazole 40 MG capsule Commonly known as: PRILOSEC Take 1 capsule (40 mg total) by mouth in the morning and at bedtime.   promethazine 12.5 MG tablet Commonly known as: PHENERGAN Take 1 tablet (12.5 mg total) by mouth every 8 (eight) hours as needed for nausea or vomiting.   SUPER B COMPLEX/C PO Take 1 tablet by mouth daily.   traZODone 100 MG tablet Commonly known as: DESYREL Take 1.5 tablets (150 mg total) by mouth at bedtime.       Past Surgical History:  He  has a past surgical history that includes Appendectomy (2002); Total knee arthroplasty (Right, 2011); Hernia repair (05-15-13); Joint replacement; Tonsillectomy; Colonoscopy; Cardiac catheterization; Revision total knee arthroplasty (Right, 06/23/2014); Total knee revision (Right, 06/23/2014); Cardiac catheterization (N/A, 08/27/2015); Wisdom tooth extraction; Knee arthroscopy (Left, 12/17/2018); and Ear Cyst Excision (Left, 07/08/2019).  Family History:  His family history includes Heart disease in his father.  Social History:  He  reports that he quit smoking about 14 years ago. His smoking use included cigarettes. He has a 36.00 pack-year smoking history. He has never used smokeless tobacco. He reports current alcohol use of about 8.0 - 9.0 standard drinks of alcohol per week. He reports that he does not use drugs.

## 2019-09-12 ENCOUNTER — Encounter: Payer: Self-pay | Admitting: Orthopedic Surgery

## 2019-09-12 NOTE — Progress Notes (Signed)
Post-Op Visit Note   Patient: [name]           Date of Birth: 25-Aug-1965           MRN: [id number] Visit Date: 09/10/2019 PCP: [name], MD   Assessment & Plan:  Chief Complaint:  Chief Complaint  Patient presents with  . Left Knee - Pain   Visit Diagnoses:  1. Baker's cyst of knee, left     Plan: Rayniel presents for follow-up left knee.  Had aspiration injection 09/03/2019.  Reports some recurrent swelling in the knee.  Ibuprofen bothers his stomach.  He is also tried Aleve which bothers his stomach.  He is on aspirin.  Ultrasound after surgery negative for DVT.  On exam does have some recurrent mild effusion in the knee as well as recurrent swelling in that Baker's cyst region.  Negative Homans and no calf tenderness.  Range of motion excellent.  Plan at this time is for Celebrex and a hinged knee brace.  I think he is having some recurrent swelling in that knee from fluid leaking out from that resected Baker's cyst region.  Capsule in the posterior part of the knee was weak.  Treatment options at this time include revision cyst excision with allograft placement on the capsule versus observation.  I would favor the latter for now.  In general he has pretty significant arthritis in the medial compartment of the knee which is pushing fluid back into the knee.  We will see him back as needed.  Follow-Up Instructions: No follow-ups on file.   Orders:  No orders of the defined types were placed in this encounter.  Meds ordered this encounter  Medications  . celecoxib (CELEBREX) 100 MG capsule    Sig: Take one capsule twice a day x 10 days, then one capsule daily as needed.    Dispense:  30 capsule    Refill:  0    Imaging: No results found.  PMFS History: Patient Active Problem List   Diagnosis Date Noted  . 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 Radiculopathy, cervical region 06/10/2019  . Carpal  tunnel syndrome, left upper limb 06/10/2019  . 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 Spinal stenosis of cervical region 12/31/2018  . Loose body of left knee   . Acute medial meniscus tear of left knee   . Plantar fasciitis of left foot 08/24/2017  . Chronic pain of both hips 06/20/2017  . Angioedema of lips 11/26/2016  . 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 Hypothyroidism due to acquired atrophy of thyroid 09/14/2015  . Arthritis of knee 06/23/2014  . Encounter for preventive health examination 06/17/2014  . Enlarged RV (right ventricle) 06/17/2014  . GERD (gastroesophageal reflux disease) 04/20/2014  . Cervical spine degeneration 02/17/2014  . Overweight 10/29/2013  . Chronic knee pain 10/29/2013  . Diabetes mellitus type 2 in obese ([place]) 10/23/2012  . Seborrheic dermatitis, unspecified 05/17/2012  . Fatty liver 04/22/2012  . Hypogonadism male 03/12/2012  . Sciatica of right side 12/24/2010  . Hyperlipidemia associated with type 2 diabetes mellitus ([place]) 01/13/2009  . Anxiety state 01/13/2009  . Essential hypertension 01/13/2009  . OSA (obstructive sleep apnea) 01/13/2009   Past Medical History:  Diagnosis Date  . Alcohol-induced chronic pancreatitis ([place]) 01/09/2019  . Anxiety   . Anxiety and depression   . Arthritis  knees and shoulders  . Benzodiazepine withdrawal with complication ([place]) [id number]  . Bruit    L  . Chest pain    hx  . Cognitive complaints with normal neuropsychological exam 10/23/2016  . Diabetes mellitus without complication ([place])    "borderline", diet controlled, no meds, patient has lost 30 lbs  . Edema   . Fatty liver   . GERD (gastroesophageal reflux disease)    uses Omeprazole  . Goiter   . HLD (hyperlipidemia)   . Hypertension    essential, benign  . Hypothyroidism   . Impotence of organic origin   . Murmur    never  has caused any problems  . Neuromuscular disorder ([place])   . Other chest pain    tightness, pressure  . Palpitation    hx  . Precordial pain   . Sleep apnea    uses cpap    Family History  Problem Relation Age of Onset  . Heart disease Father     Past Surgical History:  Procedure Laterality Date  . APPENDECTOMY  2002   done at [facility]  . CARDIAC CATHETERIZATION    . CARDIAC CATHETERIZATION N/A 08/27/2015   Procedure: Left Heart Cath and Coronary Angiography;  Surgeon: Jolaine Artist, MD;  Location: [facility];  Service: Cardiovascular;  Laterality: N/A;  . COLONOSCOPY    . EAR CYST EXCISION Left 07/08/2019   Procedure: open excision baker's cyst left knee;  Surgeon: Meredith Pel, MD;  Location: [facility];  Service: Orthopedics;  Laterality: Left;  . HERNIA REPAIR  [id number]   umbilical  . JOINT REPLACEMENT     right knee  . KNEE ARTHROSCOPY Left 12/17/2018   Procedure: left knee arthroscopy, meniscal debridement, loose body removal;  Surgeon: Meredith Pel, MD;  Location: [facility];  Service: Orthopedics;  Laterality: Left;  . REVISION TOTAL KNEE ARTHROPLASTY Right 06/23/2014   DR Marlou Sa  . TONSILLECTOMY    . TOTAL KNEE ARTHROPLASTY Right 2011   right  . TOTAL KNEE REVISION Right 06/23/2014   Procedure: TOTAL KNEE REVISION;  Surgeon: Meredith Pel, MD;  Location: [place];  Service: Orthopedics;  Laterality: Right;  . WISDOM TOOTH EXTRACTION     Social History   Occupational History  . Occupation: IT trainer  . Occupation: SERVICE DIRECTOR    Employer: [employer]  Tobacco Use  . Smoking status: Former Smoker    Packs/day: 2.00    Years: 18.00    Pack years: 36.00    Types: Cigarettes    Quit date: 04/24/2005    Years since quitting: 14.3  . Smokeless tobacco: Never Used  . Tobacco comment: quit 20 years ago   Substance and Sexual Activity  . Alcohol use: Yes    Alcohol/week: 8.0 - 9.0 standard  drinks    Types: 6 Cans of beer, 2 - 3 Shots of liquor per week    Comment: beer/liquor  . Drug use: No  . Sexual activity: Yes

## 2019-09-16 ENCOUNTER — Telehealth: Payer: Self-pay | Admitting: Pulmonary Disease

## 2019-09-16 NOTE — Telephone Encounter (Signed)
PCC's do you have any idea what this means?

## 2019-09-17 NOTE — Telephone Encounter (Signed)
This is auth# for sleep study it has been entered in the appt data [name]

## 2019-09-23 ENCOUNTER — Other Ambulatory Visit: Payer: Self-pay

## 2019-09-23 ENCOUNTER — Other Ambulatory Visit (HOSPITAL_COMMUNITY): Payer: 59

## 2019-09-23 ENCOUNTER — Ambulatory Visit (INDEPENDENT_AMBULATORY_CARE_PROVIDER_SITE_OTHER): Payer: 59 | Admitting: Licensed Clinical Social Worker

## 2019-09-23 DIAGNOSIS — F411 Generalized anxiety disorder: Secondary | ICD-10-CM

## 2019-09-23 DIAGNOSIS — F0632 Mood disorder due to known physiological condition with major depressive-like episode: Secondary | ICD-10-CM | POA: Diagnosis not present

## 2019-09-23 NOTE — Progress Notes (Signed)
Virtual Visit via Telephone Note  I connected with [name] on 09/23/19 at  1:00 PM EDT by telephone and verified that I am speaking with the correct person using two identifiers.  The patient was advised to call back or seek an in-person evaluation if the symptoms worsen or if the condition fails to improve as anticipated.  I provided 16 minutes of non-face-to-face time during this encounter.   Renee Harder, LCSW    THERAPIST PROGRESS NOTE  Session Time: 1pm-1:16pm  Participation Level: Active  Behavioral Response: NAAlertEuthymic  Type of Therapy: Individual Therapy  Treatment Goals addressed: Coping  Interventions: Motivational Interviewing, Solution Focused and Supportive  Summary: Client presented alert and oriented for scheduled therapy session via telephone. Client checked in and reported that things have been "pretty good". Client elaborated in identifying several positives that have occurred since our last session such as him seeing his family more frequently, his disability case being re-reviewed, and receiving emotional and financial support from the [place] group he is a member of. Client reported feeling hopeful and optimistic towards the future and further processed feelings related to these positive changes. Client reported he is willing to continue services with clinician B. Mackenzie after this therapist leaves this clinic within the coming weeks. Client did not endorse any SI/HI/psychosis during this engagement.  Suicidal/Homicidal: Nowithout intent/plan  Therapist Response: Clinician met with client for scheduled session and facilitated check in assessing recent stressors, changes in mood, and SI/HI/psychosis. Clinician engaged in discussion on identified positives and utilized MI, OARS to assess how these have impacted client's mood. Clinician allowed space for further processing of emotions and provided supportive statements throughout session. Clinician  informed client that I will be resigning from this clinic effective 10/10/19 and provided the name of clinician in this office who would be available to see client moving forward. Clinician agreed to have front desk staff contact client to schedule future appointment.  Plan: Pt to be scheduled with clinician B. Mackenzie for services moving forward.   Diagnosis: Axis I: Depressive d/o due to medical condition      Renee Harder, LCSW 09/23/2019

## 2019-09-25 ENCOUNTER — Encounter (HOSPITAL_BASED_OUTPATIENT_CLINIC_OR_DEPARTMENT_OTHER): Payer: 59 | Admitting: Pulmonary Disease

## 2019-10-01 ENCOUNTER — Other Ambulatory Visit: Payer: Self-pay | Admitting: Internal Medicine

## 2019-10-04 ENCOUNTER — Other Ambulatory Visit (HOSPITAL_COMMUNITY)
Admission: RE | Admit: 2019-10-04 | Discharge: 2019-10-04 | Disposition: A | Discharge status: Home / Self Care | Payer: 59 | Source: Ambulatory Visit | Attending: Pulmonary Disease | Admitting: Pulmonary Disease

## 2019-10-04 DIAGNOSIS — Z20822 Contact with and (suspected) exposure to covid-19: Secondary | ICD-10-CM | POA: Diagnosis not present

## 2019-10-04 DIAGNOSIS — Z01812 Encounter for preprocedural laboratory examination: Secondary | ICD-10-CM | POA: Diagnosis present

## 2019-10-04 LAB — SARS CORONAVIRUS 2 (TAT 6-24 HRS): SARS Coronavirus 2: NEGATIVE

## 2019-10-06 ENCOUNTER — Ambulatory Visit (HOSPITAL_BASED_OUTPATIENT_CLINIC_OR_DEPARTMENT_OTHER): Payer: 59 | Attending: Pulmonary Disease | Admitting: Pulmonary Disease

## 2019-10-06 ENCOUNTER — Other Ambulatory Visit: Payer: Self-pay

## 2019-10-06 DIAGNOSIS — R442 Other hallucinations: Secondary | ICD-10-CM | POA: Insufficient documentation

## 2019-10-06 DIAGNOSIS — G4733 Obstructive sleep apnea (adult) (pediatric): Secondary | ICD-10-CM | POA: Diagnosis not present

## 2019-10-06 DIAGNOSIS — F329 Major depressive disorder, single episode, unspecified: Secondary | ICD-10-CM | POA: Insufficient documentation

## 2019-10-10 ENCOUNTER — Telehealth: Payer: Self-pay | Admitting: Pulmonary Disease

## 2019-10-10 DIAGNOSIS — G4733 Obstructive sleep apnea (adult) (pediatric): Secondary | ICD-10-CM

## 2019-10-10 NOTE — Telephone Encounter (Signed)
Please let him know he did well with CPAP 14 cm H2O during his sleep study.  Please send order to his DME to arrange for new CPAP machine at 14 cm H2O with heated humidity.  He needs ROV in 2 months with me or NP.

## 2019-10-10 NOTE — Procedures (Signed)
    Patient Name: [name], [name] Date: 10/06/2019 Gender: Male D.O.B: 01/25/1966 Age (years): 35 Referring Provider: Chesley Mires MD, ABSM Height (inches): 72 Interpreting Physician: Chesley Mires MD, ABSM Weight (lbs): 225 RPSGT: Carolin Coy BMI: 31 MRN: [id number] Neck Size: 18.00  CLINICAL INFORMATION The patient is referred for a CPAP titration to treat sleep apnea.  Date of NPSG 01/21/07: AHI 18, SpO2 low 56%.  SLEEP STUDY TECHNIQUE As per the AASM Manual for the Scoring of Sleep and Associated Events v2.3 (April 2016) with a hypopnea requiring 4% desaturations.  The channels recorded and monitored were frontal, central and occipital EEG, electrooculogram (EOG), submentalis EMG (chin), nasal and oral airflow, thoracic and abdominal wall motion, anterior tibialis EMG, snore microphone, electrocardiogram, and pulse oximetry. Continuous positive airway pressure (CPAP) was initiated at the beginning of the study and titrated to treat sleep-disordered breathing.  MEDICATIONS Medications self-administered by patient taken the night of the study : BUSPAR, CELEBREX, KLONOPIN, BENTYL, GABAPENTIN, ROBAXIN, METOPROLOL TARTRATE, PRILOSEC, TRAZODONE  TECHNICIAN COMMENTS Comments added by technician: PATIENT WAS ORDERED AS A CPAP TITRATION. Comments added by scorer: N/A  RESPIRATORY PARAMETERS Optimal PAP Pressure (cm): 12 AHI at Optimal Pressure (/hr): 0.7 Overall Minimal O2 (%): 86.0 Supine % at Optimal Pressure (%): 99 Minimal O2 at Optimal Pressure (%): 86.0   SLEEP ARCHITECTURE The study was initiated at 9:49:06 PM and ended at 5:06:37 AM.  Sleep onset time was 3.1 minutes and the sleep efficiency was 98.1%%. The total sleep time was 429.4 minutes.  The patient spent 5.9%% of the night in stage N1 sleep, 75.0%% in stage N2 sleep, 0.0%% in stage N3 and 19.1% in REM.Stage REM latency was 163.5 minutes  Wake after sleep onset was 5.0. Alpha intrusion was absent. Supine  sleep was 54.99%.  CARDIAC DATA The 2 lead EKG demonstrated sinus rhythm. The mean heart rate was 75.5 beats per minute. Other EKG findings include: None.  LEG MOVEMENT DATA The total Periodic Limb Movements of Sleep (PLMS) were 0. The PLMS index was 0.0. A PLMS index of <15 is considered normal in adults.  IMPRESSIONS - The optimal PAP pressure was 12 cm of water.  DIAGNOSIS - Obstructive Sleep Apnea (327.23 [G47.33 ICD-10])  RECOMMENDATIONS - Trial of CPAP therapy on 12 cm H2O with a Medium size Resmed Full Face Mask AirFit F10 mask and heated humidification.  [Electronically signed] 10/10/2019 05:22 PM  Chesley Mires MD, [place], American Board of Sleep Medicine   NPI: [id number]

## 2019-10-10 NOTE — Telephone Encounter (Signed)
Called pt and advised message from the provider. Pt understood and verbalized understanding. Nothing further is needed.   Rx for new CPAP sent to [facility]

## 2019-10-14 ENCOUNTER — Ambulatory Visit (HOSPITAL_COMMUNITY): Payer: 59 | Admitting: Licensed Clinical Social Worker

## 2019-10-24 ENCOUNTER — Other Ambulatory Visit (HOSPITAL_COMMUNITY)
Admission: RE | Admit: 2019-10-24 | Discharge: 2019-10-24 | Disposition: A | Discharge status: Home / Self Care | Payer: 59 | Source: Ambulatory Visit | Attending: Internal Medicine | Admitting: Internal Medicine

## 2019-10-24 ENCOUNTER — Telehealth (INDEPENDENT_AMBULATORY_CARE_PROVIDER_SITE_OTHER): Payer: 59 | Admitting: Internal Medicine

## 2019-10-24 ENCOUNTER — Other Ambulatory Visit (HOSPITAL_COMMUNITY): Admission: RE | Admit: 2019-10-24 | Payer: 59 | Source: Home / Self Care | Admitting: Internal Medicine

## 2019-10-24 ENCOUNTER — Encounter: Payer: Self-pay | Admitting: Internal Medicine

## 2019-10-24 VITALS — Ht 72.01 in | Wt 222.0 lb

## 2019-10-24 DIAGNOSIS — E039 Hypothyroidism, unspecified: Secondary | ICD-10-CM | POA: Insufficient documentation

## 2019-10-24 DIAGNOSIS — E119 Type 2 diabetes mellitus without complications: Secondary | ICD-10-CM | POA: Insufficient documentation

## 2019-10-24 DIAGNOSIS — K760 Fatty (change of) liver, not elsewhere classified: Secondary | ICD-10-CM

## 2019-10-24 DIAGNOSIS — I2510 Atherosclerotic heart disease of native coronary artery without angina pectoris: Secondary | ICD-10-CM | POA: Insufficient documentation

## 2019-10-24 DIAGNOSIS — E034 Atrophy of thyroid (acquired): Secondary | ICD-10-CM

## 2019-10-24 DIAGNOSIS — E1169 Type 2 diabetes mellitus with other specified complication: Secondary | ICD-10-CM

## 2019-10-24 DIAGNOSIS — M130 Polyarthritis, unspecified: Secondary | ICD-10-CM

## 2019-10-24 DIAGNOSIS — Z87891 Personal history of nicotine dependence: Secondary | ICD-10-CM | POA: Insufficient documentation

## 2019-10-24 DIAGNOSIS — M47892 Other spondylosis, cervical region: Secondary | ICD-10-CM | POA: Diagnosis not present

## 2019-10-24 DIAGNOSIS — M47896 Other spondylosis, lumbar region: Secondary | ICD-10-CM | POA: Insufficient documentation

## 2019-10-24 DIAGNOSIS — E669 Obesity, unspecified: Secondary | ICD-10-CM

## 2019-10-24 LAB — CBC WITH DIFFERENTIAL/PLATELET
Abs Immature Granulocytes: 0.05 10*3/uL (ref 0.00–0.07)
Basophils Absolute: 0 10*3/uL (ref 0.0–0.1)
Basophils Relative: 1 %
Eosinophils Absolute: 0.3 10*3/uL (ref 0.0–0.5)
Eosinophils Relative: 5 %
HCT: 44.9 % (ref 39.0–52.0)
Hemoglobin: 15.2 g/dL (ref 13.0–17.0)
Immature Granulocytes: 1 %
Lymphocytes Relative: 14 %
Lymphs Abs: 0.8 10*3/uL (ref 0.7–4.0)
MCH: 31.8 pg (ref 26.0–34.0)
MCHC: 33.9 g/dL (ref 30.0–36.0)
MCV: 93.9 fL (ref 80.0–100.0)
Monocytes Absolute: 0.5 10*3/uL (ref 0.1–1.0)
Monocytes Relative: 9 %
Neutro Abs: 4.1 10*3/uL (ref 1.7–7.7)
Neutrophils Relative %: 70 %
Platelets: 243 10*3/uL (ref 150–400)
RBC: 4.78 MIL/uL (ref 4.22–5.81)
RDW: 12.5 % (ref 11.5–15.5)
WBC: 5.8 10*3/uL (ref 4.0–10.5)
nRBC: 0 % (ref 0.0–0.2)

## 2019-10-24 LAB — COMPREHENSIVE METABOLIC PANEL
ALT: 32 U/L (ref 0–44)
AST: 25 U/L (ref 15–41)
Albumin: 3.9 g/dL (ref 3.5–5.0)
Alkaline Phosphatase: 105 U/L (ref 38–126)
Anion gap: 8 (ref 5–15)
BUN: 13 mg/dL (ref 6–20)
CO2: 28 mmol/L (ref 22–32)
Calcium: 9 mg/dL (ref 8.9–10.3)
Chloride: 104 mmol/L (ref 98–111)
Creatinine, Ser: 0.92 mg/dL (ref 0.61–1.24)
GFR calc Af Amer: >60 mL/min (ref >60)
GFR calc non Af Amer: >60 mL/min (ref >60)
Glucose, Bld: 129 mg/dL — ABNORMAL HIGH (ref 70–99)
Potassium: 4 mmol/L (ref 3.5–5.1)
Sodium: 140 mmol/L (ref 135–145)
Total Bilirubin: 0.4 mg/dL (ref 0.3–1.2)
Total Protein: 6.5 g/dL (ref 6.5–8.1)

## 2019-10-24 LAB — TSH: TSH: 5.113 u[IU]/mL — ABNORMAL HIGH (ref 0.350–4.500)

## 2019-10-24 LAB — HEMOGLOBIN A1C
Hgb A1c MFr Bld: 6.6 % — ABNORMAL HIGH (ref 4.8–5.6)
Mean Plasma Glucose: 142.72 mg/dL

## 2019-10-24 LAB — IRON AND TIBC
Iron: 98 ug/dL (ref 45–182)
Saturation Ratios: 19 % (ref 17.9–39.5)
TIBC: 526 ug/dL — ABNORMAL HIGH (ref 250–450)
UIBC: 428 ug/dL

## 2019-10-24 LAB — URIC ACID: Uric Acid, Serum: 6.5 mg/dL (ref 3.7–8.6)

## 2019-10-24 LAB — C-REACTIVE PROTEIN: CRP: 1.5 mg/dL — ABNORMAL HIGH (ref <1.0)

## 2019-10-24 LAB — SEDIMENTATION RATE: Sed Rate: 1 mm/hr (ref 0–16)

## 2019-10-24 LAB — FERRITIN: Ferritin: 32 ng/mL (ref 24–336)

## 2019-10-24 MED ORDER — OMEPRAZOLE 40 MG PO CPDR: 40.0000 mg | DELAYED_RELEASE_CAPSULE | Freq: Two times a day (BID) | ORAL | 1 refills | Status: DC

## 2019-10-24 MED ORDER — HYDROCODONE-ACETAMINOPHEN 10-325 MG PO TABS: 1.0000 | ORAL_TABLET | Freq: Four times a day (QID) | ORAL | 0 refills | Status: DC | PRN

## 2019-10-24 NOTE — Progress Notes (Signed)
Virtual Visit via caregiver   This visit type was conducted due to national recommendations for restrictions regarding the COVID-19 pandemic (e.g. social distancing).  This format is felt to be most appropriate for this patient at this time.  All issues noted in this document were discussed and addressed.  No physical exam was performed (except for noted visual exam findings with Video Visits).   I connected with@ on 10/24/19 at 11:30 AM EDT by a video enabled telemedicine application  and verified that I am speaking with the correct person using two identifiers. Location patient: home Location provider: work or home office Persons participating in the virtual visit: patient, provider  I discussed the limitations, risks, security and privacy concerns of performing an evaluation and management service by telephone and the availability of in person appointments. I also discussed with the patient that there may be a patient responsible charge related to this service. The patient expressed understanding and agreed to proceed.  Reason for visit: joint pain , request for rheumatology referral   HPI:  [name] is a 54 yr old male with a history of type 2 DM, CAD, and  hypothyroidism presents with cc of Constant pain involving multiple joints, including bilateral ankles and knees, left shoulder and lower back.  His pain is worse in the morning, states that his ankles are too painful to bear weight.  Ankles remain swollen even in the morning before getting out of bed. Right greater than left. He also notes a numb feeling in the soles of feet and sharp pains involving the entire sole of his foot for the last 3 months. 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  He has had toe joint replacement on the left  great toe by Dr Daylene Katayama in 2020 but described his current foot pain as different.   ROS: See pertinent positives and negatives per HPI.  Past Medical History:  Diagnosis Date  . Alcohol-induced chronic pancreatitis ([place]) 01/09/2019  . Anxiety   . Anxiety and depression   . Arthritis    knees and shoulders  . Benzodiazepine withdrawal with complication ([place]) 2/44/6286  . Bruit    L  . Chest pain    hx  . Cognitive complaints with normal neuropsychological exam 10/23/2016  . Diabetes mellitus without complication ([place])    "borderline", diet controlled, no meds, patient has lost 30 lbs  . Edema   . Fatty liver   . GERD (gastroesophageal reflux disease)    uses Omeprazole  . Goiter   . HLD (hyperlipidemia)   . Hypertension    essential, benign  . Hypothyroidism   . Impotence of organic origin   . Murmur    never has caused any problems  . Neuromuscular disorder ([place])   . Other chest pain    tightness, pressure  . Palpitation    hx  . Precordial pain   . Sleep apnea    uses cpap    Past Surgical History:  Procedure Laterality Date  . APPENDECTOMY  2002   done at [facility]  . CARDIAC CATHETERIZATION    . CARDIAC CATHETERIZATION N/A 08/27/2015   Procedure: Left Heart Cath and Coronary Angiography;  Surgeon: Jolaine Artist, MD;  Location: [facility] [facility];  Service: Cardiovascular;  Laterality: N/A;  . COLONOSCOPY    . EAR CYST EXCISION Left 07/08/2019   Procedure: open excision baker's cyst left knee;  Surgeon: Meredith Pel, MD;  Location: [facility];  Service: Orthopedics;  Laterality: Left;  . HERNIA REPAIR  6-64-40   umbilical  . JOINT REPLACEMENT     right knee  . KNEE ARTHROSCOPY Left 12/17/2018   Procedure: left knee arthroscopy, meniscal debridement, loose body removal;  Surgeon: Meredith Pel, MD;  Location: [place];  Service: Orthopedics;  Laterality: Left;  . REVISION TOTAL KNEE ARTHROPLASTY Right 06/23/2014   DR Marlou Sa  . TONSILLECTOMY    . TOTAL KNEE  ARTHROPLASTY Right 2011   right  . TOTAL KNEE REVISION Right 06/23/2014   Procedure: TOTAL KNEE REVISION;  Surgeon: Meredith Pel, MD;  Location: [facility];  Service: Orthopedics;  Laterality: Right;  . WISDOM TOOTH EXTRACTION      Family History  Problem Relation Age of Onset  . Heart disease Father     SOCIAL HX:  reports that he quit smoking about 14 years ago. His smoking use included cigarettes. He has a 36.00 pack-year smoking history. He has never used smokeless tobacco. He reports current alcohol use of about 8.0 - 9.0 standard drinks of alcohol per week. He reports that he does not use drugs.   Current Outpatient Medications:  .  ARIPiprazole (ABILIFY) 5 MG tablet, Take 1 tablet (5 mg total) by mouth at bedtime., Disp: 30 tablet, Rfl: 2 .  buPROPion (WELLBUTRIN XL) 300 MG 24 hr tablet, Take 1 tablet (300 mg total) by mouth daily., Disp: 30 tablet, Rfl: 2 .  busPIRone (BUSPAR) 15 MG tablet, Take 1 tablet (15 mg total) by mouth 3 (three) times daily., Disp: 90 tablet, Rfl: 2 .  celecoxib (CELEBREX) 100 MG capsule, Take one capsule twice a day x 10 days, then one capsule daily as needed., Disp: 30 capsule, Rfl: 0 .  clobetasol (TEMOVATE) 0.05 % external solution, Apply 1 application topically 2 (two) times daily as needed (scalp irritation)., Disp: , Rfl:  .  Clobetasol Propionate 0.05 % shampoo, Apply to dry scalp and leave on for 15 minutes (Patient taking differently: Apply 1 application topically 2 (two) times a week. Apply to dry scalp and leave on for 15 minutes as needed for itching.), Disp: 118 mL, Rfl: 5 .  clonazePAM (KLONOPIN) 1 MG tablet, TAKE 1 TABLET BY MOUTH TWICE DAILY AS NEEDED FOR ANXIETY, Disp: 60 tablet, Rfl: 5 .  dicyclomine (BENTYL) 20 MG tablet, TAKE 1 TABLET BY MOUTH EVERY 6 HOURS, Disp: 120 tablet, Rfl: 0 .  gabapentin (NEURONTIN) 600 MG tablet, Take 1 tablet (600 mg total) by mouth 3 (three) times daily., Disp: 270 tablet, Rfl: 0 .  levothyroxine (EUTHYROX) 88  MCG tablet, TAKE 1 TABLET BY MOUTH ONCE DAILY BEFORE BREAKFAST, Disp: 90 tablet, Rfl: 1 .  losartan-hydrochlorothiazide (HYZAAR) 100-25 MG tablet, Take 1 tablet by mouth daily. (Patient taking differently: Take 1 tablet by mouth at bedtime. ), Disp: 90 tablet, Rfl: 3 .  methocarbamol (ROBAXIN) 500 MG tablet, Take 1 tablet (500 mg total) by mouth every 8 (eight) hours as needed., Disp: 90 tablet, Rfl: 11 .  metoprolol tartrate (LOPRESSOR) 25 MG tablet, Take 1 tablet (25 mg total) by mouth 2 (two) times daily., Disp: 180 tablet, Rfl: 1 .  Multiple Vitamin (MULTIVITAMIN WITH MINERALS) TABS tablet, Take 1 tablet by mouth daily., Disp: , Rfl:  .  omeprazole (PRILOSEC) 40 MG capsule, Take  1 capsule (40 mg total) by mouth in the morning and at bedtime., Disp: 180 capsule, Rfl: 1 .  promethazine (PHENERGAN) 12.5 MG tablet, Take 1 tablet (12.5 mg total) by mouth every 8 (eight) hours as needed for nausea or vomiting., Disp: 20 tablet, Rfl: 0 .  SUPER B COMPLEX/C PO, Take 1 tablet by mouth daily., Disp: , Rfl:  .  traZODone (DESYREL) 100 MG tablet, Take 1.5 tablets (150 mg total) by mouth at bedtime., Disp: 135 tablet, Rfl: 2 .  HYDROcodone-acetaminophen (NORCO) 10-325 MG tablet, Take 1 tablet by mouth every 6 (six) hours as needed., Disp: 30 tablet, Rfl: 0  EXAM:  VITALS per patient if applicable:  GENERAL: alert, oriented, appears well and in no acute distress  HEENT: atraumatic, conjunttiva clear, no obvious abnormalities on inspection of external nose and ears  NECK: normal movements of the head and neck  LUNGS: on inspection no signs of respiratory distress, breathing rate appears normal, no obvious gross SOB, gasping or wheezing  CV: no obvious cyanosis  MS: moves all visible extremities without noticeable abnormality  PSYCH/NEURO: pleasant and cooperative, no obvious depression or anxiety, speech and thought processing grossly intact  ASSESSMENT AND PLAN:  Discussed the following  assessment and plan:  Polyarthritis - Plan: Sedimentation rate, Comprehensive metabolic panel, MEQ-A83 antigen, ANA w/Reflex if Positive, Iron and TIBC, C-reactive protein, Cyclic citrul peptide antibody, IgG, Ferritin, CBC with Differential/Platelet, Uric acid, Rheumatoid factor, Ambulatory referral to Rheumatology  Diabetes mellitus type 2 in obese ([place]) - Plan: Hemoglobin A1c  Hypothyroidism due to acquired atrophy of thyroid - Plan: TSH  Polyarthritis of multiple sites  Fatty liver  Coronary artery calcification seen on CT scan  Polyarthritis of multiple sites He has requested rheumatology referral ; rheumatologic serologic workup was done today prior to referral and all labs were normal except for CRP.  HLA B27 is still pending .  Given his chronic pain and objective evidence of DJD  And his overuse of NSAIDS without relief.  I have agreed to treat hi. Prescribing hydrocodone for use not more than twice daily.  Reduce NSAID use to EITHER meloxicam or ibuprofren and resume a daily PPI   Lab Results  Component Value Date   ESRSEDRATE 1 10/24/2019      Component Value Date/Time   CRP 1.5 (H) 10/24/2019 1305   CRP 4.1 10/01/2017 1523   CRP 0.71 (H) 12/23/2010 1610   Lab Results  Component Value Date   LABURIC 6.5 10/24/2019   Lab Results  Component Value Date   WBC 5.8 10/24/2019   HGB 15.2 10/24/2019   HCT 44.9 10/24/2019   MCV 93.9 10/24/2019   PLT 243 10/24/2019   Lab Results  Component Value Date   ANA Negative 10/24/2019   RF <10.0 10/24/2019    Fatty liver Liver enzymes normalized with alcohol abstinence and weight loss .  Continue statin.  Will start metformin for a1c  > 6.5   Lab Results  Component Value Date   ALT 32 10/24/2019   AST 25 10/24/2019   ALKPHOS 105 10/24/2019   BILITOT 0.4 10/24/2019   Lab Results  Component Value Date   HGBA1C 6.6 (H) 10/24/2019     Diabetes mellitus type 2 in obese ([place]) Most recent a1c has improved from 6.7 to  6.6 on diet alone.  will recommend starting asa  And statin for CAD evidenced on cardiac CT and metformin for concurrent fatty liver management.  continue ARB.  Lab Results  Component  Value Date   HGBA1C 6.6 (H) 10/24/2019     Lab Results  Component Value Date   MICROALBUR 47.1 (H) 01/09/2019   MICROALBUR 0.8 10/16/2017       Hypothyroidism due to acquired atrophy of thyroid Patient's thyroid function is underactive on current levothyroxine dose of 88 mcg.  Will have him take one extra dose per week until current supply has been used up  Ad then refill at 100 mcg daily   Lab Results  Component Value Date   TSH 5.113 (H) 10/24/2019     Coronary artery calcification seen on CT scan Will recommend starting  Asa. LDL was < 50 last September     I discussed the assessment and treatment plan with the patient. The patient was provided an opportunity to ask questions and all were answered. The patient agreed with the plan and demonstrated an understanding of the instructions.   The patient was advised to call back or seek an in-person evaluation if the symptoms worsen or if the condition fails to improve as anticipated. A total of 40 minutes was spent with patient more than half of which was spent in counseling patient on the above mentioned issues , reviewing and explaining recent labs and imaging studies done, and coordination of care.  [name], MD

## 2019-10-24 NOTE — Patient Instructions (Signed)
STOP USING MELOXICAM AND IBUPROFEN ON THE SAME Day!    I am prescribing Hydrocodone .  Do not take more than 2 daily   Referral to rheumatology in progress

## 2019-10-25 LAB — ANA W/REFLEX: Anti Nuclear Antibody (ANA): NEGATIVE

## 2019-10-25 LAB — RHEUMATOID FACTOR: Rheumatoid fact SerPl-aCnc: <10 IU/mL (ref 0.0–13.9)

## 2019-10-26 LAB — CYCLIC CITRUL PEPTIDE ANTIBODY, IGG/IGA: CCP Antibodies IgG/IgA: 6 units (ref 0–19)

## 2019-10-27 ENCOUNTER — Encounter: Payer: Self-pay | Admitting: Podiatry

## 2019-10-27 ENCOUNTER — Other Ambulatory Visit: Payer: Self-pay | Admitting: Orthopedic Surgery

## 2019-10-27 ENCOUNTER — Other Ambulatory Visit: Payer: Self-pay | Admitting: Internal Medicine

## 2019-10-27 DIAGNOSIS — M130 Polyarthritis, unspecified: Secondary | ICD-10-CM | POA: Insufficient documentation

## 2019-10-27 DIAGNOSIS — I2510 Atherosclerotic heart disease of native coronary artery without angina pectoris: Secondary | ICD-10-CM | POA: Insufficient documentation

## 2019-10-27 MED ORDER — METFORMIN HCL 500 MG PO TABS
500.0000 mg | ORAL_TABLET | Freq: Two times a day (BID) | ORAL | 3 refills | Status: DC
Start: 2019-10-27 — End: 2020-11-11

## 2019-10-27 MED ORDER — LEVOTHYROXINE SODIUM 88 MCG PO TABS: ORAL_TABLET | ORAL | 1 refills | Status: DC

## 2019-10-27 NOTE — Assessment & Plan Note (Signed)
Liver enzymes normalized with alcohol abstinence and weight loss .  Continue statin.  Will start metformin for a1c  > 6.5   Lab Results  Component Value Date   ALT 32 10/24/2019   AST 25 10/24/2019   ALKPHOS 105 10/24/2019   BILITOT 0.4 10/24/2019   Lab Results  Component Value Date   HGBA1C 6.6 (H) 10/24/2019

## 2019-10-27 NOTE — Assessment & Plan Note (Addendum)
Will recommend starting  Asa. LDL was < 50 last September

## 2019-10-27 NOTE — Assessment & Plan Note (Addendum)
He has requested rheumatology referral ; rheumatologic serologic workup was done today prior to referral and all labs were normal except for CRP.  HLA B27 is still pending .  Given his chronic pain and objective evidence of DJD  And his overuse of NSAIDS without relief.  I have agreed to treat hi. Prescribing hydrocodone for use not more than twice daily.  Reduce NSAID use to EITHER meloxicam or ibuprofren and resume a daily PPI   Lab Results  Component Value Date   ESRSEDRATE 1 10/24/2019      Component Value Date/Time   CRP 1.5 (H) 10/24/2019 1305   CRP 4.1 10/01/2017 1523   CRP 0.71 (H) 12/23/2010 1610   Lab Results  Component Value Date   LABURIC 6.5 10/24/2019   Lab Results  Component Value Date   WBC 5.8 10/24/2019   HGB 15.2 10/24/2019   HCT 44.9 10/24/2019   MCV 93.9 10/24/2019   PLT 243 10/24/2019   Lab Results  Component Value Date   ANA Negative 10/24/2019   RF <10.0 10/24/2019

## 2019-10-27 NOTE — Assessment & Plan Note (Signed)
Patient's thyroid function is underactive on current levothyroxine dose of 88 mcg.  Will have him take one extra dose per week until current supply has been used up  Ad then refill at 100 mcg daily   Lab Results  Component Value Date   TSH 5.113 (H) 10/24/2019

## 2019-10-27 NOTE — Assessment & Plan Note (Signed)
Most recent a1c has improved from 6.7 to 6.6 on diet alone.  will recommend starting asa  And statin for CAD evidenced on cardiac CT and metformin for concurrent fatty liver management.  continue ARB.  Lab Results  Component Value Date   HGBA1C 6.6 (H) 10/24/2019     Lab Results  Component Value Date   MICROALBUR 47.1 (H) 01/09/2019   MICROALBUR 0.8 10/16/2017      

## 2019-10-28 ENCOUNTER — Other Ambulatory Visit: Payer: Self-pay | Admitting: Internal Medicine

## 2019-10-28 ENCOUNTER — Other Ambulatory Visit: Payer: Self-pay

## 2019-10-28 MED ORDER — GABAPENTIN 600 MG PO TABS: 600.0000 mg | ORAL_TABLET | Freq: Three times a day (TID) | ORAL | 0 refills | Status: DC

## 2019-10-28 MED ORDER — HYDROCODONE-ACETAMINOPHEN 10-325 MG PO TABS: 1.0000 | ORAL_TABLET | Freq: Four times a day (QID) | ORAL | 0 refills | Status: DC | PRN

## 2019-10-28 NOTE — Telephone Encounter (Signed)
Pls advise.  

## 2019-10-29 ENCOUNTER — Other Ambulatory Visit: Payer: Self-pay

## 2019-10-29 ENCOUNTER — Telehealth: Payer: Self-pay

## 2019-10-29 ENCOUNTER — Encounter (HOSPITAL_COMMUNITY): Payer: Self-pay | Admitting: Psychiatry

## 2019-10-29 ENCOUNTER — Telehealth (INDEPENDENT_AMBULATORY_CARE_PROVIDER_SITE_OTHER): Payer: 59 | Admitting: Psychiatry

## 2019-10-29 DIAGNOSIS — F331 Major depressive disorder, recurrent, moderate: Secondary | ICD-10-CM

## 2019-10-29 DIAGNOSIS — F411 Generalized anxiety disorder: Secondary | ICD-10-CM

## 2019-10-29 MED ORDER — ARIPIPRAZOLE 5 MG PO TABS: 5.0000 mg | ORAL_TABLET | Freq: Every day | ORAL | 2 refills | Status: DC

## 2019-10-29 MED ORDER — BUPROPION HCL ER (XL) 300 MG PO TB24: 300.0000 mg | ORAL_TABLET | Freq: Every day | ORAL | 2 refills | Status: DC

## 2019-10-29 NOTE — Progress Notes (Signed)
Virtual Visit via Telephone Note  I connected with [name] on 10/29/19 at 10:40 AM EDT by telephone and verified that I am speaking with the correct person using two identifiers.  Location: Patient: In hospital with his wife who is admitted Provider: Home Office   I discussed the limitations, risks, security and privacy concerns of performing an evaluation and management service by telephone and the availability of in person appointments. I also discussed with the patient that there may be a patient responsible charge related to this service. The patient expressed understanding and agreed to proceed.   History of Present Illness: Patient is evaluated by phone session.  Currently he is in the hospital taking care of his wife who is admitted.  Patient told that wife admitted recently because of worsening of her chronic health issues.  Patient also complaining of leg pain and he feels he may have a bunion and need to see physician.  Patient is a sole caretaker of his wife who has multiple health issues.  He is sad because his Social Security disability is denied and now he is trying to get court hearing date to appeal the process.  He endorsed financial issues.  He gets some time overwhelmed because he is the only person who helps his wife.  Patient has his own health issues.  He is taking multiple medication.  He feels the Wellbutrin and Abilify is helping.  He also taking trazodone, gabapentin, BuSpar and Klonopin from the PCP.  He has no tremors, shakes or any EPS.  Denies any suicidal thoughts or homicidal thoughts.  He is seen therapist Leandro Reasoner regularly for coping skills.   Past Psychiatric History:Reviewed. H/Odepression and anxiety. No h/osuicidal attempt or inpatient treatment. Seen Dr. Jake Michaelis and given Zoloft, Klonopin and Cymbalta. Later PCP added BuSpar and trazodone.We tried Lamictal but did not help.  Recent Results (from the past 2160 hour(s))  SARS CORONAVIRUS 2  (TAT 6-24 HRS) Nasopharyngeal Nasopharyngeal Swab     Status: None   Collection Time: 10/04/19  1:36 PM   Specimen: Nasopharyngeal Swab  Result Value Ref Range   SARS Coronavirus 2 NEGATIVE NEGATIVE    Comment: (NOTE) SARS-CoV-2 target nucleic acids are NOT DETECTED.  The SARS-CoV-2 RNA is generally detectable in upper and lower respiratory specimens during the acute phase of infection. Negative results do not preclude SARS-CoV-2 infection, do not rule out co-infections with other pathogens, and should not be used as the sole basis for treatment or other patient management decisions. Negative results must be combined with clinical observations, patient history, and epidemiological information. The expected result is Negative.  Fact Sheet for Patients: SugarRoll.be  Fact Sheet for Healthcare Providers: https://www.woods-mathews.com/  This test is not yet approved or cleared by the [place] FDA and  has been authorized for detection and/or diagnosis of SARS-CoV-2 by FDA under an Emergency Use Authorization (EUA). This EUA will remain  in effect (meaning this test can be used) for the duration of the COVID-19 declaration under Se ction 564(b)(1) of the Act, 21 U.S.C. section 360bbb-3(b)(1), unless the authorization is terminated or revoked sooner.  Performed at [facility], [place] [address], [place], [place] [address]   CBC with Differential/Platelet     Status: None   Collection Time: 10/24/19  1:05 PM  Result Value Ref Range   WBC 5.8 4.0 - 10.5 K/uL   RBC 4.78 4.22 - 5.81 MIL/uL   Hemoglobin 15.2 13.0 - 17.0 g/dL   HCT 44.9  39 - 52 %   MCV 93.9 80.0 - 100.0 fL   MCH 31.8 26.0 - 34.0 pg   MCHC 33.9 30.0 - 36.0 g/dL   RDW 12.5 11.5 - 15.5 %   Platelets 243 150 - 400 K/uL   nRBC 0.0 0.0 - 0.2 %   Neutrophils Relative % 70 %   Neutro Abs 4.1 1.7 - 7.7 K/uL   Lymphocytes Relative 14 %   Lymphs Abs 0.8 0.7 - 4.0 K/uL    Monocytes Relative 9 %   Monocytes Absolute 0.5 0 - 1 K/uL   Eosinophils Relative 5 %   Eosinophils Absolute 0.3 0 - 0 K/uL   Basophils Relative 1 %   Basophils Absolute 0.0 0 - 0 K/uL   Immature Granulocytes 1 %   Abs Immature Granulocytes 0.05 0.00 - 0.07 K/uL    Comment: Performed at [facility] [address], [place], [place] [address]  Comprehensive metabolic panel     Status: Abnormal   Collection Time: 10/24/19  1:05 PM  Result Value Ref Range   Sodium 140 135 - 145 mmol/L   Potassium 4.0 3.5 - 5.1 mmol/L   Chloride 104 98 - 111 mmol/L   CO2 28 22 - 32 mmol/L   Glucose, Bld 129 (H) 70 - 99 mg/dL    Comment: Glucose reference range applies only to samples taken after fasting for at least 8 hours.   BUN 13 6 - 20 mg/dL   Creatinine, Ser 0.92 0.61 - 1.24 mg/dL   Calcium 9.0 8.9 - 10.3 mg/dL   Total Protein 6.5 6.5 - 8.1 g/dL   Albumin 3.9 3.5 - 5.0 g/dL   AST 25 15 - 41 U/L   ALT 32 0 - 44 U/L   Alkaline Phosphatase 105 38 - 126 U/L   Total Bilirubin 0.4 0.3 - 1.2 mg/dL   GFR calc non Af Amer >60 >60 mL/min   GFR calc Af Amer >60 >60 mL/min   Anion gap 8 5 - 15    Comment: Performed at [facility], [place] [address], [place], [place] [address]  C-reactive protein     Status: Abnormal   Collection Time: 10/24/19  1:05 PM  Result Value Ref Range   CRP 1.5 (H) <1.0 mg/dL    Comment: Performed at [facility], [place] [address], [place], [place] [address]  Iron and TIBC     Status: Abnormal   Collection Time: 10/24/19  1:05 PM  Result Value Ref Range   Iron 98 45 - 182 ug/dL   TIBC 526 (H) 250 - 450 ug/dL   Saturation Ratios 19 17.9 - 39.5 %   UIBC 428 ug/dL    Comment: Performed at [facility], [place] [address], [place], [place] [address]  Ferritin     Status: None   Collection Time: 10/24/19  1:05 PM  Result Value Ref Range   Ferritin 32 24 - 336 ng/mL    Comment: Performed at [facility] [address] [place], [place] [address]  TSH      Status: Abnormal   Collection Time: 10/24/19  1:05 PM  Result Value Ref Range   TSH 5.113 (H) 0.350 - 4.500 uIU/mL    Comment: Performed by a 3rd Generation assay with a functional sensitivity of <=0.01 uIU/mL. Performed at [facility], [place] [address] [place], [place] [address]   Uric acid     Status: None  Collection Time: 10/24/19  1:05 PM  Result Value Ref Range   Uric Acid, Serum 6.5 3.7 - 8.6 mg/dL    Comment: Performed at [facility], [place] [address] [place], [place] [address]  ANA w/Reflex     Status: None   Collection Time: 10/24/19  1:05 PM  Result Value Ref Range   Anti Nuclear Antibody (ANA) Negative Negative    Comment: (NOTE) Performed At: [facility] [address] [place], [place] [id number] Rush Farmer MD [id number]   CYCLIC CITRUL PEPTIDE ANTIBODY, IGG/IGA     Status: None   Collection Time: 10/24/19  1:05 PM  Result Value Ref Range   CCP Antibodies IgG/IgA 6 0 - 19 units    Comment: (NOTE)                          Negative               <20                          Weak positive      20 - 39                          Moderate positive  40 - 59                          Strong positive        >59 Performed At: [facility] [place], [place] [id number] Rush Farmer MD [id number]   Sedimentation rate     Status: None   Collection Time: 10/24/19  1:05 PM  Result Value Ref Range   Sed Rate 1 0 - 16 mm/hr    Comment: Performed at [facility], [place] [address], [place], [place] [address]  Rheumatoid factor     Status: None   Collection Time: 10/24/19  1:05 PM  Result Value Ref Range   Rhuematoid fact SerPl-aCnc <10.0 0.0 - 13.9 IU/mL    Comment: (NOTE) Performed At: [facility] [place], [place] [id number] Rush Farmer MD [id number]   Hemoglobin A1c     Status: Abnormal   Collection Time: 10/24/19  1:05 PM  Result Value Ref Range   Hgb A1c MFr Bld 6.6 (H) 4.8 - 5.6  %    Comment: (NOTE) Pre diabetes:          5.7%-6.4%  Diabetes:              >6.4%  Glycemic control for   <7.0% adults with diabetes    Mean Plasma Glucose 142.72 mg/dL    Comment: Performed at [facility], [place] [address] [place], [place] [address]       Psychiatric Specialty Exam: Physical Exam  Review of Systems  Weight 222 lb (100.7 kg).There is no height or weight on file to calculate BMI.  General Appearance: NA  Eye Contact:  NA  Speech:  Clear and Coherent and Slow  Volume:  Normal  Mood:  Anxious and Dysphoric  Affect:  NA  Thought Process:  Goal Directed  Orientation:  Full (Time, Place, and Person)  Thought Content:  Rumination  Suicidal Thoughts:  No  Homicidal Thoughts:  No  Memory:  Immediate;   Good Recent;   Fair Remote;   Fair  Judgement:  Intact  Insight:  Present  Psychomotor Activity:  NA  Concentration:  Concentration: Fair and Attention Span: Fair  Recall:  Good  Fund of Knowledge:  Good  Language:  Good  Akathisia:  No  Handed:  Right  AIMS (if indicated):     Assets:  Communication Skills Desire for Improvement Housing Resilience  ADL's:  Intact  Cognition:  WNL  Sleep:   6-7 hrs      Assessment and Plan: Major depressive disorder, recurrent.  Generalized anxiety disorder.  Patient stable on his psych medicine.  He does not want to change even though we discussed polypharmacy and side effects.  Patient feels the combination is working and that he is seeing stable.  So far he has no side effects.  We will continue Abilify 5 mg daily and Wellbutrin XL 300 mg daily.  He is also getting BuSpar, gabapentin, Klonopin, trazodone from his PCP.  I encouraged to continue therapy with Leandro Reasoner.  Recommended to call us back if he has any question or any concern.  We will forward our note to his PCP Dr. Geanie Logan.  Follow-up in 3 months.  Follow Up Instructions:    I discussed the assessment and treatment plan with the patient. The  patient was provided an opportunity to ask questions and all were answered. The patient agreed with the plan and demonstrated an understanding of the instructions.   The patient was advised to call back or seek an in-person evaluation if the symptoms worsen or if the condition fails to improve as anticipated.  I provided 20 minutes of non-face-to-face time during this encounter.   Kathlee Nations, MD

## 2019-10-29 NOTE — Telephone Encounter (Signed)
Submitted RX yesterday

## 2019-10-29 NOTE — Telephone Encounter (Signed)
Patient pharmacy [facility] request rxrf for clelcoxib100mg  #30

## 2019-10-30 LAB — HLA-B27 ANTIGEN: HLA-B27: NEGATIVE

## 2019-11-01 ENCOUNTER — Encounter (HOSPITAL_COMMUNITY): Payer: Self-pay | Admitting: Emergency Medicine

## 2019-11-01 ENCOUNTER — Other Ambulatory Visit: Payer: Self-pay

## 2019-11-01 ENCOUNTER — Emergency Department (HOSPITAL_COMMUNITY)
Admission: EM | Admit: 2019-11-01 | Discharge: 2019-11-01 | Disposition: A | Discharge status: Home / Self Care | Payer: 59 | Attending: Emergency Medicine | Admitting: Emergency Medicine

## 2019-11-01 DIAGNOSIS — M79671 Pain in right foot: Secondary | ICD-10-CM | POA: Diagnosis not present

## 2019-11-01 DIAGNOSIS — R2241 Localized swelling, mass and lump, right lower limb: Secondary | ICD-10-CM | POA: Diagnosis not present

## 2019-11-01 DIAGNOSIS — Z5321 Procedure and treatment not carried out due to patient leaving prior to being seen by health care provider: Secondary | ICD-10-CM | POA: Diagnosis not present

## 2019-11-01 NOTE — ED Notes (Signed)
Pt name called to be roomed 3x, no response. Not in bathroom or outside.

## 2019-11-01 NOTE — ED Triage Notes (Signed)
C/o R foot pain and swelling x 2 days.  No known injury.

## 2019-11-11 ENCOUNTER — Telehealth: Payer: Self-pay | Admitting: Internal Medicine

## 2019-11-11 DIAGNOSIS — I10 Essential (primary) hypertension: Secondary | ICD-10-CM

## 2019-11-11 MED ORDER — AMLODIPINE BESYLATE 5 MG PO TABS: 5.0000 mg | ORAL_TABLET | Freq: Every day | ORAL | 3 refills | Status: DC

## 2019-11-11 MED ORDER — LOSARTAN POTASSIUM 100 MG PO TABS
100.0000 mg | ORAL_TABLET | Freq: Every day | ORAL | 3 refills | Status: DC
Start: 2019-11-11 — End: 2020-12-22

## 2019-11-11 NOTE — Assessment & Plan Note (Signed)
Medication changed due to gout flare per dr  Lavera Guise tot he following:  Losartan 100 mg and amlodipine 5 mg daily.   hct stopped

## 2019-11-11 NOTE — Telephone Encounter (Signed)
My Chart message sent

## 2019-11-12 ENCOUNTER — Telehealth: Payer: Self-pay

## 2019-11-12 ENCOUNTER — Encounter: Payer: Self-pay | Admitting: Orthopedic Surgery

## 2019-11-12 ENCOUNTER — Ambulatory Visit (INDEPENDENT_AMBULATORY_CARE_PROVIDER_SITE_OTHER): Payer: 59 | Admitting: Orthopedic Surgery

## 2019-11-12 DIAGNOSIS — M7122 Synovial cyst of popliteal space [Baker], left knee: Secondary | ICD-10-CM

## 2019-11-12 MED ORDER — LIDOCAINE HCL 1 % IJ SOLN
5.0000 mL | INTRAMUSCULAR | Status: AC | PRN
  Administered 2019-11-12: 5 mL

## 2019-11-12 MED ORDER — BUPIVACAINE HCL 0.25 % IJ SOLN
4.0000 mL | INTRAMUSCULAR | Status: AC | PRN
  Administered 2019-11-12: 4 mL via INTRA_ARTICULAR

## 2019-11-12 MED ORDER — METHYLPREDNISOLONE ACETATE 40 MG/ML IJ SUSP
40.0000 mg | INTRAMUSCULAR | Status: AC | PRN
  Administered 2019-11-12: 40 mg via INTRA_ARTICULAR

## 2019-11-12 NOTE — Progress Notes (Signed)
Office Visit Note   Patient: [name]           Date of Birth: 01/17/1966           MRN: [id number] Visit Date: 11/12/2019 Requested by: [name], MD [place],  [place] [address] PCP: [name], MD  Subjective: Chief Complaint  Patient presents with  . Left Knee - Pain    HPI: [name] is a patient with left knee pain.  Reports some popping in the knee.  Had left knee arthroscopy in 820 and cyst removal 321.  In general the cyst is smaller.  He is pretty functional with the knee.              ROS: All systems reviewed are negative as they relate to the chief complaint within the history of present illness.  Patient denies  fevers or chills.   Assessment & Plan: Visit Diagnoses:  1. Baker's cyst of knee, left     Plan: Impression is left knee pain with only mild effusion today.  Plan is aspiration injection today.  I will think he is really there for total knee yet but he does have some degenerative changes present.  Cyst itself is smaller and more livable.  I do think he would do well with gel injection which we will try in 4 weeks. Unlikely  This patient is diagnosed with osteoarthritis of the knee(s).    Radiographs show evidence of joint space narrowing, osteophytes, subchondral sclerosis and/or subchondral cysts.  This patient has knee pain which interferes with functional and activities of daily living.    This patient has experienced inadequate response, adverse effects and/or intolerance with conservative treatments such as acetaminophen, NSAIDS, topical creams, physical therapy or regular exercise, knee bracing and/or weight loss.   This patient has experienced inadequate response or has a contraindication to intra articular steroid injections for at least 3 months.   This patient is not scheduled to have a total knee replacement within 6 months of starting treatment with viscosupplementation.  Follow-Up Instructions: Return in  about 4 weeks (around 12/10/2019).   Orders:  No orders of the defined types were placed in this encounter.  No orders of the defined types were placed in this encounter.     Procedures: Large Joint Inj: L knee on 11/12/2019 6:16 PM Indications: diagnostic evaluation, joint swelling and pain Details: 18 G 1.5 in needle, superolateral approach  Arthrogram: No  Medications: 5 mL lidocaine 1 %; 40 mg methylPREDNISolone acetate 40 MG/ML; 4 mL bupivacaine 0.25 % Outcome: tolerated well, no immediate complications Procedure, treatment alternatives, risks and benefits explained, specific risks discussed. Consent was given by the patient. Immediately prior to procedure a time out was called to verify the correct patient, procedure, equipment, support staff and site/side marked as required. Patient was prepped and draped in the usual sterile fashion.       Clinical Data: No additional findings.  Objective: Vital Signs: There were no vitals taken for this visit.  Physical Exam: .    Constitutional: Patient appears well-developed HEENT:  Head: Normocephalic Eyes:EOM are normal Neck: Normal range of motion Cardiovascular: Normal rate Pulmonary/chest: Effort normal Neurologic: Patient is alert Skin: Skin is warm Psychiatric: Patient has normal mood and affect  l  Ortho Exam: Ortho exam demonstrates mild effusion left knee with excellent range of motion.  Medial joint line tenderness is present.  Collateral crucial ligaments are stable.  Small Baker's cyst present  3 x 3 cm.  Nontender to palpation.  No groin pain with internal extra rotation of the leg.  Specialty Comments:  No specialty comments available.  Imaging: No results found.   PMFS History: Patient Active Problem List   Diagnosis Date Noted  . Polyarthritis of multiple sites 10/27/2019  . Coronary artery calcification seen on CT scan 10/27/2019  . 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 Radiculopathy, cervical region 06/10/2019  . Carpal tunnel syndrome, left upper limb 06/10/2019  . 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 Spinal stenosis of cervical region 12/31/2018  . Loose body of left knee   . Acute medial meniscus tear of left knee   . Plantar fasciitis of left foot 08/24/2017  . Chronic pain of both hips 06/20/2017  . Angioedema of lips 11/26/2016  . 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 Hypothyroidism due to acquired atrophy of thyroid 09/14/2015  . Arthritis of knee 06/23/2014  . Encounter for preventive health examination 06/17/2014  . Enlarged RV (right ventricle) 06/17/2014  . GERD (gastroesophageal reflux disease) 04/20/2014  . Cervical spine degeneration 02/17/2014  . Overweight 10/29/2013  . Chronic knee pain 10/29/2013  . Diabetes mellitus type 2 in obese ([place]) 10/23/2012  . Seborrheic dermatitis, unspecified 05/17/2012  . Fatty liver 04/22/2012  . Hypogonadism male 03/12/2012  . Sciatica of right side 12/24/2010  . Hyperlipidemia associated with type 2 diabetes mellitus ([place]) 01/13/2009  . Anxiety state 01/13/2009  . Essential hypertension 01/13/2009  . OSA (obstructive sleep apnea) 01/13/2009   Past Medical History:  Diagnosis Date  . Alcohol-induced chronic pancreatitis ([place]) 01/09/2019  . Anxiety   . Anxiety and depression   . Arthritis    knees and shoulders  . Benzodiazepine withdrawal with complication ([place]) 09/08/15  . Bruit    L  . Chest pain    hx  . Cognitive complaints with normal neuropsychological exam 10/23/2016  . Diabetes mellitus without complication ([place])    "borderline", diet controlled, no meds, patient has lost 30 lbs  . Edema   . Fatty liver   . GERD (gastroesophageal reflux disease)    uses Omeprazole  . Goiter   . HLD (hyperlipidemia)   . Hypertension     essential, benign  . Hypothyroidism   . Impotence of organic origin   . Murmur    never has caused any problems  . Neuromuscular disorder ([place])   . Other chest pain    tightness, pressure  . Palpitation    hx  . Precordial pain   . Sleep apnea    uses cpap    Family History  Problem Relation Age of Onset  . Heart disease Father     Past Surgical History:  Procedure Laterality Date  . APPENDECTOMY  2002   done at [facility]  . CARDIAC CATHETERIZATION    . CARDIAC CATHETERIZATION N/A 08/27/2015   Procedure: Left Heart Cath and Coronary Angiography;  Surgeon: Jolaine Artist, MD;  Location: [facility];  Service: Cardiovascular;  Laterality: N/A;  . COLONOSCOPY    . EAR CYST EXCISION Left 07/08/2019   Procedure: open excision baker's cyst left knee;  Surgeon: Meredith Pel, MD;  Location: [facility];  Service: Orthopedics;  Laterality: Left;  . HERNIA REPAIR  4-94-49   umbilical  . JOINT REPLACEMENT     right knee  .  KNEE ARTHROSCOPY Left 12/17/2018   Procedure: left knee arthroscopy, meniscal debridement, loose body removal;  Surgeon: Meredith Pel, MD;  Location: [facility];  Service: Orthopedics;  Laterality: Left;  . REVISION TOTAL KNEE ARTHROPLASTY Right 06/23/2014   DR Marlou Sa  . TONSILLECTOMY    . TOTAL KNEE ARTHROPLASTY Right 2011   right  . TOTAL KNEE REVISION Right 06/23/2014   Procedure: TOTAL KNEE REVISION;  Surgeon: Meredith Pel, MD;  Location: [facility];  Service: Orthopedics;  Laterality: Right;  . WISDOM TOOTH EXTRACTION     Social History   Occupational History  . Occupation: IT trainer  . Occupation: SERVICE DIRECTOR    Employer: [employer]  Tobacco Use  . Smoking status: Former Smoker    Packs/day: 2.00    Years: 18.00    Pack years: 36.00    Types: Cigarettes    Quit date: 04/24/2005    Years since quitting: 14.5  . Smokeless tobacco: Never Used  . Tobacco comment: quit 20 years ago     Vaping Use  . Vaping Use: Never used  Substance and Sexual Activity  . Alcohol use: Yes    Alcohol/week: 8.0 - 9.0 standard drinks    Types: 6 Cans of beer, 2 - 3 Shots of liquor per week    Comment: beer/liquor  . Drug use: No  . Sexual activity: Yes

## 2019-11-12 NOTE — Telephone Encounter (Signed)
Can we please get auth for left knee gel injection? 

## 2019-11-13 NOTE — Telephone Encounter (Signed)
Noted  

## 2019-11-21 ENCOUNTER — Telehealth: Payer: Self-pay

## 2019-11-21 NOTE — Telephone Encounter (Signed)
Submitted VOB, [employer], left knee. 

## 2019-11-24 ENCOUNTER — Ambulatory Visit (HOSPITAL_COMMUNITY): Payer: 59 | Admitting: Licensed Clinical Social Worker

## 2019-11-27 ENCOUNTER — Telehealth: Payer: Self-pay | Admitting: Orthopedic Surgery

## 2019-11-27 NOTE — Telephone Encounter (Signed)
Received Denial from both [employer] and [employer]. Forwarding to MD team

## 2019-11-27 NOTE — Telephone Encounter (Signed)
Please advise patient and I dont l think that anything else to do until he wants to do something else.

## 2019-11-27 NOTE — Telephone Encounter (Signed)
Received call from [facility] with [facility] stating medical injection was denied      Denial # [id number]    CPT code 20610 and [id number]   Request was denied today 11/27/2019   Request was thru 11/25/2019-02/23/2020   The number to contact Almyra Free is [phone]

## 2019-11-27 NOTE — Telephone Encounter (Signed)
See below. Please advise.  

## 2019-11-27 NOTE — Telephone Encounter (Signed)
IC s/w patient and advised  

## 2019-12-02 ENCOUNTER — Ambulatory Visit: Payer: 59 | Admitting: Orthopaedic Surgery

## 2019-12-03 ENCOUNTER — Other Ambulatory Visit: Payer: Self-pay

## 2019-12-03 ENCOUNTER — Ambulatory Visit (INDEPENDENT_AMBULATORY_CARE_PROVIDER_SITE_OTHER): Payer: 59 | Admitting: Licensed Clinical Social Worker

## 2019-12-03 ENCOUNTER — Encounter (HOSPITAL_COMMUNITY): Payer: Self-pay | Admitting: Licensed Clinical Social Worker

## 2019-12-03 ENCOUNTER — Other Ambulatory Visit: Payer: Self-pay | Admitting: Surgical

## 2019-12-03 DIAGNOSIS — F331 Major depressive disorder, recurrent, moderate: Secondary | ICD-10-CM | POA: Diagnosis not present

## 2019-12-03 DIAGNOSIS — F411 Generalized anxiety disorder: Secondary | ICD-10-CM

## 2019-12-03 NOTE — Progress Notes (Addendum)
Virtual Visit via Telephone Note  I connected with [name] on 12/03/19 at  2:00 PM EDT by telephone and verified that I am speaking with the correct person using two identifiers.     THERAPIST PROGRESS NOTE  Session Time: 2pm-2:35pm  Participation Level: Active  Behavioral Response: NAAlertEuthymic  Type of Therapy: Individual Therapy  Treatment Goals addressed: Coping  Interventions: Motivational Interviewing, Solution Focused and Supportive  Summary: Client presented alert and oriented for scheduled therapy session via telephone. Client checked in and reported that things have been "pretty good". Today is the initial session after transferring from [place], LCSW. He was ok to change his appointments to monthly sessions to build a trusting, therapeutic relationship. Reviewed tx plan with pt who verbalized acceptance of the plan. 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 They live off her disability check. During her care and hospital stays they lost the house and patient lost his job. They were able to buy a trailer to live in. Patient reports he has reached out to family members who have helped them out some, "though I don't like to ask." Patient has filed for disability and has been denied 2x and has an upcoming appeal hearing in October. He has an Forensic psychologist. Patient reports they have 2 adult children who are unable to help them financially. Patient reports, "I feel hopeful."  Question: [name]    Suicidal/Homicidal: Nowithout intent/plan  .   Diagnosis: Axis I: Depressive d/o due to medical condition      Earnest Mcgillis S, LCAS 12/03/2019

## 2019-12-08 ENCOUNTER — Encounter: Payer: Self-pay | Admitting: Internal Medicine

## 2019-12-08 ENCOUNTER — Ambulatory Visit (INDEPENDENT_AMBULATORY_CARE_PROVIDER_SITE_OTHER): Payer: 59 | Admitting: Internal Medicine

## 2019-12-08 ENCOUNTER — Other Ambulatory Visit: Payer: Self-pay

## 2019-12-08 VITALS — BP 130/74 | HR 79 | Temp 98.2°F | Resp 15 | Ht 72.0 in | Wt 230.2 lb

## 2019-12-08 DIAGNOSIS — R609 Edema, unspecified: Secondary | ICD-10-CM | POA: Insufficient documentation

## 2019-12-08 DIAGNOSIS — E1140 Type 2 diabetes mellitus with diabetic neuropathy, unspecified: Secondary | ICD-10-CM

## 2019-12-08 DIAGNOSIS — E1121 Type 2 diabetes mellitus with diabetic nephropathy: Secondary | ICD-10-CM

## 2019-12-08 DIAGNOSIS — E034 Atrophy of thyroid (acquired): Secondary | ICD-10-CM

## 2019-12-08 DIAGNOSIS — R600 Localized edema: Secondary | ICD-10-CM

## 2019-12-08 DIAGNOSIS — M5412 Radiculopathy, cervical region: Secondary | ICD-10-CM

## 2019-12-08 DIAGNOSIS — M25562 Pain in left knee: Secondary | ICD-10-CM

## 2019-12-08 DIAGNOSIS — I2510 Atherosclerotic heart disease of native coronary artery without angina pectoris: Secondary | ICD-10-CM

## 2019-12-08 DIAGNOSIS — H60543 Acute eczematoid otitis externa, bilateral: Secondary | ICD-10-CM | POA: Insufficient documentation

## 2019-12-08 DIAGNOSIS — Z1211 Encounter for screening for malignant neoplasm of colon: Secondary | ICD-10-CM

## 2019-12-08 DIAGNOSIS — M25561 Pain in right knee: Secondary | ICD-10-CM

## 2019-12-08 DIAGNOSIS — G8929 Other chronic pain: Secondary | ICD-10-CM

## 2019-12-08 MED ORDER — MOMETASONE FUROATE 0.1 % EX CREA: 1.0000 "application " | TOPICAL_CREAM | Freq: Every day | CUTANEOUS | 0 refills | Status: DC

## 2019-12-08 MED ORDER — HYDROCODONE-ACETAMINOPHEN 10-325 MG PO TABS: 1.0000 | ORAL_TABLET | Freq: Four times a day (QID) | ORAL | 0 refills | Status: DC | PRN

## 2019-12-08 MED ORDER — GLUCOSE BLOOD VI STRP: ORAL_STRIP | 12 refills | Status: DC

## 2019-12-08 MED ORDER — LEVOTHYROXINE SODIUM 100 MCG PO TABS: ORAL_TABLET | ORAL | 1 refills | Status: DC

## 2019-12-08 MED ORDER — ATORVASTATIN CALCIUM 20 MG PO TABS: 20.0000 mg | ORAL_TABLET | Freq: Every day | ORAL | 3 refills | Status: DC

## 2019-12-08 NOTE — Assessment & Plan Note (Addendum)
Bilateral,  With history of right knee replacement and new diagnosis of meniscal tear In the left.  He is not able to proceed with surgery at the present time due to his wife's condition

## 2019-12-08 NOTE — Assessment & Plan Note (Addendum)
Patient's thyroid function is underactive on current levothyroxine dose of 88   mcg daily.  UNFORTUNATELY  IT WAS JUST filled,  So he will add one extra dose per week (2 tablets ) on Sundays only,  until she uses up the current supply.  New rx for 100 mcg sent to local pharmacy with instructiion not to fill but keep on file for next refill

## 2019-12-08 NOTE — Assessment & Plan Note (Signed)
Mometasone ointment prescribed

## 2019-12-08 NOTE — Assessment & Plan Note (Addendum)
He has pitting edema and nonpitting edema due to venous  Insufficiency.  Advised to use furosemide not more than every other day and to start wearing compression stockings.

## 2019-12-08 NOTE — Assessment & Plan Note (Signed)
Most recent a1c has improved from 6.7 to 6.6 on diet alone.  will recommend starting asa  And statin for CAD evidenced on cardiac CT and metformin for concurrent fatty liver management.  continue ARB.  Lab Results  Component Value Date   HGBA1C 6.6 (H) 10/24/2019     Lab Results  Component Value Date   MICROALBUR 47.1 (H) 01/09/2019   MICROALBUR 0.8 10/16/2017

## 2019-12-08 NOTE — Assessment & Plan Note (Addendum)
Diabetes is well controlled ; metformin started last month.   he has loss of sensation over several areas of both feet,  And peripheral edema . Warned to not walk barefoot outside  .  Taking ARB .  Statin therapy deferred due to msucle pain  Lab Results  Component Value Date   CHOL 132 01/09/2019   HDL 33 (L) 01/09/2019   LDLCALC 44 01/09/2019   LDLDIRECT 101.0 10/16/2017   TRIG 277 (H) 01/09/2019   CHOLHDL 4.0 01/09/2019

## 2019-12-08 NOTE — Assessment & Plan Note (Signed)
Reviewed with patient today. He is at high risk for future cardiac events. .  Statin therapy strongly recommended . Trial of atorvastatin

## 2019-12-08 NOTE — Progress Notes (Signed)
Subjective:  Patient ID: [name], male    DOB: [birth year]  Age: 54 y.o. MRN: [id number]  CC: The primary encounter diagnosis was Type 2 diabetes mellitus with diabetic neuropathy, without long-term current use of insulin ([place]). 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  HPI [name] presents for follow up on  Type 2 DM,  Hypertension, chronic pain and anxiety managed with hydrocodone and clonazepam  This visit occurred during the SARS-CoV-2 public health emergency.  Safety protocols were in place, including screening questions prior to the visit, additional usage of staff PPE, and extensive cleaning of exam room while observing appropriate contact time as indicated for disinfecting solutions.    Patient has received both doses of the available COVID 19 vaccine without complications.  Patient continues to mask when outside of the home except when walking in yard or at safe distances from others .  Patient denies any change in mood or development of unhealthy behaviors resuting from the pandemic's restriction of activities and socialization.    T2DM: he does not feel well due to constant MSK pain.  He is  not  exercising regularly due to bilateral knee pain and an untreated meniscal tear in his left knee.   Not checking  blood sugars  But wants to start.  Tolerating metformin started one month  ago.  Denies any recent hypoglyemic symptoms .  Taking   medications as directed. Following a carbohydrate modified diet  About half of the time due to financial stressors . Denies numbness, burning and tingling of extremities. Appetite is good.   HE IS IN  CONSTANT pain from multiple sources:   low back pain,  Bilateral knee pain  And cervical spine pain.  He cannot sit for more than 30 minutes without having to stretch.  He has pai I both knees.  History of right knee replacement and has a torn ACL on the left. He has bilateral shoulder pain due to injuries that resulted in rotator cuff tears. He Cannot lift more than 15 lbs and also has a history of  biceps tear on the right, and rotator cuff surgery on both shoulders .  Has C2-3 cervical disk issue causing cervical  radiculitis on the left        Outpatient Medications Prior to Visit  Medication Sig Dispense Refill  . amLODipine (NORVASC) 5 MG tablet Take 1 tablet (5 mg total) by mouth daily. 90 tablet 3  . ARIPiprazole (ABILIFY) 5 MG tablet Take 1 tablet (5 mg total) by mouth at bedtime. 30 tablet 2  . buPROPion (WELLBUTRIN XL) 300 MG 24 hr tablet Take 1 tablet (300 mg total) by mouth daily. 30 tablet 2  . busPIRone (BUSPAR) 15 MG tablet Take 1 tablet (15 mg total) by mouth 3 (three) times daily. 90 tablet 2  . celecoxib (CELEBREX) 100 MG capsule TAKE 1 CAPSULE BY MOUTH TWICE DAILY FOR 10 DAYS, THEN 1 CAPSULE DAILY AS NEEDED 30 capsule 0  . clobetasol (TEMOVATE) 0.05 % external solution Apply 1 application topically 2 (two) times daily as needed (scalp irritation).    . Clobetasol Propionate 0.05 % shampoo Apply to dry scalp and leave on for 15 minutes (Patient taking  differently: Apply 1 application topically 2 (two) times a week. Apply to dry scalp and leave on for 15 minutes as needed for itching.) 118 mL 5  . clonazePAM (KLONOPIN) 1 MG tablet TAKE 1 TABLET BY MOUTH TWICE DAILY AS NEEDED FOR ANXIETY 60 tablet 5  . dicyclomine (BENTYL) 20 MG tablet TAKE 1 TABLET BY MOUTH EVERY 6 HOURS 120 tablet 0  . gabapentin (NEURONTIN) 600 MG tablet Take 1 tablet (600 mg total) by mouth 3 (three) times daily. 270 tablet 0  . losartan (COZAAR) 100 MG tablet Take 1 tablet (100 mg total) by mouth daily. 90 tablet  3  . meloxicam (MOBIC) 15 MG tablet Take 15 mg by mouth daily.    . metFORMIN (GLUCOPHAGE) 500 MG tablet Take 1 tablet (500 mg total) by mouth 2 (two) times daily with a meal. 180 tablet 3  . methocarbamol (ROBAXIN) 500 MG tablet Take 1 tablet (500 mg total) by mouth every 8 (eight) hours as needed. 90 tablet 11  . metoprolol tartrate (LOPRESSOR) 25 MG tablet Take 1 tablet (25 mg total) by mouth 2 (two) times daily. 180 tablet 1  . Multiple Vitamin (MULTIVITAMIN WITH MINERALS) TABS tablet Take 1 tablet by mouth daily.    Marland Kitchen omeprazole (PRILOSEC) 40 MG capsule Take 1 capsule (40 mg total) by mouth in the morning and at bedtime. 180 capsule 1  . promethazine (PHENERGAN) 12.5 MG tablet Take 1 tablet (12.5 mg total) by mouth every 8 (eight) hours as needed for nausea or vomiting. 20 tablet 0  . SUPER B COMPLEX/C PO Take 1 tablet by mouth daily.    . traZODone (DESYREL) 100 MG tablet Take 1.5 tablets (150 mg total) by mouth at bedtime. 135 tablet 2  . HYDROcodone-acetaminophen (NORCO) 10-325 MG tablet Take 1 tablet by mouth every 6 (six) hours as needed. 30 tablet 0  . levothyroxine (EUTHYROX) 88 MCG tablet TAKE 1 TABLET BY MOUTH ONCE DAILY BEFORE BREAKFAST 90 tablet 1   No facility-administered medications prior to visit.    Review of Systems;  Patient denies headache, fevers, malaise, unintentional weight loss, skin rash, eye pain, sinus congestion and sinus pain, sore throat, dysphagia,  hemoptysis , cough, dyspnea, wheezing, chest pain, palpitations, orthopnea, edema, abdominal pain, nausea, melena, diarrhea, constipation, flank pain, dysuria, hematuria, urinary  Frequency, nocturia, numbness, tingling, seizures,  Focal weakness, Loss of consciousness,  Tremor, insomnia, depression, anxiety, and suicidal ideation.      Objective:  BP 130/74 (BP Location: Left Arm, Patient Position: Sitting, Cuff Size: Large)   Pulse 79   Temp 98.2 F (36.8 C) (Oral)   Resp 15   Ht 6' (1.829 m)   Wt 230 lb  3.2 oz (104.4 kg)   SpO2 96%   BMI 31.22 kg/m   BP Readings from Last 3 Encounters:  12/08/19 130/74  11/01/19 139/83  09/10/19 134/70    Wt Readings from Last 3 Encounters:  12/08/19 230 lb 3.2 oz (104.4 kg)  10/24/19 222 lb (100.7 kg)  10/06/19 225 lb (102.1 kg)    General appearance: alert, cooperative and appears stated age Ears: normal TM's and external ear canals both ears Throat: lips, mucosa, and tongue normal; teeth and gums normal Neck: no adenopathy, no carotid bruit, supple, symmetrical, trachea midline and thyroid not enlarged, symmetric, no tenderness/mass/nodules Back: symmetric, no curvature. ROM normal. No CVA tenderness. Lungs: clear to auscultation bilaterally Heart: regular rate and rhythm, S1, S2 normal, no murmur, click, rub or gallop Abdomen: soft, non-tender; bowel  sounds normal; no masses,  no organomegaly Pulses: 2+ and symmetric Skin: Skin color, texture, turgor normal. No rashes or lesions Lymph nodes: Cervical, supraclavicular, and axillary nodes normal.  Lab Results  Component Value Date   HGBA1C 6.6 (H) 10/24/2019   HGBA1C 6.7 (H) 07/04/2019   HGBA1C 5.8 (H) 01/09/2019    Lab Results  Component Value Date   CREATININE 0.92 10/24/2019   CREATININE 1.03 07/04/2019   CREATININE 1.25 (H) 01/09/2019    Lab Results  Component Value Date   WBC 5.8 10/24/2019   HGB 15.2 10/24/2019   HCT 44.9 10/24/2019   PLT 243 10/24/2019   GLUCOSE 129 (H) 10/24/2019   CHOL 132 01/09/2019   TRIG 277 (H) 01/09/2019   HDL 33 (L) 01/09/2019   LDLDIRECT 101.0 10/16/2017   LDLCALC 44 01/09/2019   ALT 32 10/24/2019   AST 25 10/24/2019   NA 140 10/24/2019   K 4.0 10/24/2019   CL 104 10/24/2019   CREATININE 0.92 10/24/2019   BUN 13 10/24/2019   CO2 28 10/24/2019   TSH 5.113 (H) 10/24/2019   PSA 0.70 10/16/2017   INR 0.98 08/27/2015   HGBA1C 6.6 (H) 10/24/2019   MICROALBUR 47.1 (H) 01/09/2019    No results found.  Assessment & Plan:   Problem  List Items Addressed This Visit      Unprioritized   Chronic knee pain    Bilateral,  With history of right knee replacement and new diagnosis of meniscal tear In the left.  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  will recommend starting asa  And statin for CAD evidenced on cardiac CT and metformin for concurrent fatty liver management.  continue ARB.  Lab Results  Component Value Date   HGBA1C 6.6 (H) 10/24/2019     Lab Results  Component Value Date   MICROALBUR 47.1 (H) 01/09/2019   MICROALBUR 0.8 10/16/2017           Relevant Medications   atorvastatin (LIPITOR) 20 MG tablet   Coronary artery calcification seen on CT scan    Reviewed with patient today. He is at high risk for future cardiac events. .  Statin therapy strongly recommended . Trial of atorvastatin       Relevant Medications   atorvastatin (LIPITOR) 20 MG tablet   Eczema of both external ears    Mometasone ointment prescribed       Edema of right lower extremity    He has pitting edema and nonpitting edema due to venous  Insufficiency.  Advised to use furosemide not more than every other day.       Hypothyroidism due to acquired atrophy of thyroid    Patient's thyroid function is underactive on current levothyroxine dose of 88   mcg daily.  UNFORTUNATELY  IT WAS JUST filled,  So he will add one extra dose per week (2 tablets ) on Sundays only,  until she uses up the current supply.  New rx for 100 mcg sent to local pharmacy with instructiion not to fill but keep on file for next refill       Relevant Medications   levothyroxine (EUTHYROX) 100 MCG  tablet   Radiculopathy, cervical region    His  pain radiates to left shoulder due to multi level disk disease by August 2020 MRI showing moderate spinal stenosis ct C5-6 level with mild cord flattening and moderate/severe let neural foraminal narrowing       Type 2 diabetes mellitus with diabetic neuropathy, unspecified ([place]) - Primary    Diabetes is well controlled ; metformin started last month.   he has loss of sensation over several areas of both feet,  And peripheral edema . Warned to not walk barefoot outside  .  Taking ARB .  Statin therapy deferred due to msucle pain  Lab Results  Component Value Date   CHOL 132 01/09/2019   HDL 33 (L) 01/09/2019   LDLCALC 44 01/09/2019   LDLDIRECT 101.0 10/16/2017   TRIG 277 (H) 01/09/2019   CHOLHDL 4.0 01/09/2019         Relevant Medications   atorvastatin (LIPITOR) 20 MG tablet   Other Relevant Orders   Lipid panel   Hemoglobin A1c   Microalbumin / creatinine urine ratio   Comprehensive metabolic panel    Other Visit Diagnoses    Screening for colon cancer       Relevant Orders   Ambulatory referral to Gastroenterology      I provided  40 minutes of  face-to-face time during this encounter reviewing patient's current problems and past surgeries, labs and imaging studies, providing counseling on the above mentioned problems , and coordination  of care .  I have changed Joshua Shields. Cockerill "Neil"'s levothyroxine. I am also having him start on glucose blood, mometasone, and atorvastatin. Additionally, I am having him maintain his multivitamin with minerals, Clobetasol Propionate, SUPER B COMPLEX/C PO, clobetasol, busPIRone, clonazePAM, traZODone, promethazine, metoprolol tartrate, methocarbamol, dicyclomine, omeprazole, metFORMIN, gabapentin, ARIPiprazole, buPROPion, losartan, amLODipine, celecoxib, meloxicam, and HYDROcodone-acetaminophen.  Meds ordered this encounter  Medications  . glucose blood test strip    Sig: Use once daily to check BS at various times  E11.9    Dispense:   100 each    Refill:  12  . levothyroxine (EUTHYROX) 100 MCG tablet    Sig: TAKE 1 TABLET BY MOUTH ONCE DAILY BEFORE BREAKFAST    Dispense:  90 tablet    Refill:  1    NOTE DOSE INCREASE. DO NOT FILL UNTIL PATIENT REQUESTS IT  . HYDROcodone-acetaminophen (NORCO) 10-325 MG tablet    Sig: Take 1 tablet by mouth every 6 (six) hours as needed.    Dispense:  30 tablet    Refill:  0  . mometasone (ELOCON) 0.1 % cream    Sig: Apply 1 application topically daily. To ear canal as needed for itching    Dispense:  45 g    Refill:  0  . atorvastatin (LIPITOR) 20 MG tablet    Sig: Take 1 tablet (20 mg total) by mouth daily.    Dispense:  90 tablet    Refill:  3    Medications Discontinued During This Encounter  Medication Reason  . levothyroxine (EUTHYROX) 88 MCG tablet   . HYDROcodone-acetaminophen (NORCO) 10-325 MG tablet Reorder    Follow-up: Return in about 3 months (around 03/02/2020).   [name], MD

## 2019-12-08 NOTE — Assessment & Plan Note (Addendum)
His pain radiates to left shoulder due to multi level disk disease by August 2020 MRI showing moderate spinal stenosis ct C5-6 level with mild cord flattening and moderate/severe let neural foraminal narrowing

## 2019-12-08 NOTE — Patient Instructions (Addendum)
For your swelling, you can use furosemide 20 mg every other day maximum to removal of some fluid. DO NOT OVERUSE IT  IT CAN CAUSE DEHYDRATION AND KIDNEY FAILURE.  Compression socks  Will help and leg elevation always helps    For your underactive thryoid:  Continue using the 88 mcg levothyroxine tablets ,   but take 2 every Sunday  Instead of one ( or pick  another day ) until you run out.  The new rx for 100 mcg daily is on file at [facility] now  I am refilling the hydrocodone for 3 months.   Mometasone for eczema of ear.  Once or twice daily as needed  Referral to [facility] for colonoscopy   Referral to [facility]

## 2019-12-09 ENCOUNTER — Telehealth: Payer: Self-pay | Admitting: Internal Medicine

## 2019-12-09 NOTE — Telephone Encounter (Signed)
[facility]  called in stated that patient needs a glucose meter and accu-check guide, and a lancet soft click pharmacy number is [phone]

## 2019-12-10 ENCOUNTER — Ambulatory Visit: Payer: 59 | Admitting: Orthopedic Surgery

## 2019-12-10 NOTE — Telephone Encounter (Signed)
How many times a day should pt be checking his blood sugars?

## 2019-12-10 NOTE — Telephone Encounter (Signed)
JUST ONCE DAILY FOR CBG'S

## 2019-12-11 MED ORDER — BLOOD GLUCOSE METER KIT: PACK | 0 refills | Status: DC

## 2019-12-11 NOTE — Telephone Encounter (Signed)
rx has been printed and placed in quick sign folder for signature.

## 2019-12-12 NOTE — Telephone Encounter (Signed)
Rx has been signed and faxed  

## 2019-12-15 ENCOUNTER — Telehealth: Payer: Self-pay | Admitting: Internal Medicine

## 2019-12-15 NOTE — Telephone Encounter (Signed)
Pt called and said that he was waiting on a letter to his attorney in regards to disability that was talked about at his last appt  And needs this by tomorrow

## 2019-12-16 NOTE — Telephone Encounter (Signed)
Paperwork has been mailed

## 2019-12-16 NOTE — Telephone Encounter (Signed)
Patient called in on 12-16-19 and stated that his wife is in hospital and he is unable to pick-up paperwork he requested that it be mail to him.

## 2019-12-23 ENCOUNTER — Ambulatory Visit (INDEPENDENT_AMBULATORY_CARE_PROVIDER_SITE_OTHER): Payer: 59 | Admitting: Orthopaedic Surgery

## 2019-12-23 ENCOUNTER — Encounter: Payer: Self-pay | Admitting: Orthopaedic Surgery

## 2019-12-23 VITALS — BP 143/89 | HR 61 | Ht 72.0 in | Wt 230.0 lb

## 2019-12-23 DIAGNOSIS — G5602 Carpal tunnel syndrome, left upper limb: Secondary | ICD-10-CM

## 2019-12-23 DIAGNOSIS — M4722 Other spondylosis with radiculopathy, cervical region: Secondary | ICD-10-CM | POA: Diagnosis not present

## 2019-12-23 NOTE — Progress Notes (Signed)
Office Visit Note   Patient: [name]           Date of Birth: 08/15/1965           MRN: [id number] Visit Date: 12/23/2019              Requested by: [name], MD [address] [place],  [place] [address] PCP: [name], MD   Assessment & Plan: Visit Diagnoses:  1. Osteoarthritis of spine with radiculopathy, cervical region   2. Carpal tunnel syndrome, left upper limb     Plan: Patient with spondylosis disc protrusion at C5-6 with moderate canal stenosis.  Last MRI was over a year old he needs a new MRI scan and then he can return for discussion of cervical fusion at C5-6.  He may need the C4-5 level included since he has severe left foraminal stenosis at that level as well.  At C5-6 he does have central stenosis.  We discussed additionally carpal tunnel release during the same anesthetic session for his carpal tunnel syndrome.  Office follow-up after new cervical MRI for review.  Follow-Up Instructions: return after MRI cervical  Orders:  No orders of the defined types were placed in this encounter.  No orders of the defined types were placed in this encounter.     Procedures: No procedures performed   Clinical Data: No additional findings.   Subjective: Chief Complaint  Patient presents with  . Neck - Pain  . Lower Back - Pain    HPI 54 year old male seen with neck pain and back pain.  His last seen 6 months ago and at that time we had discussed C5-6 fusion and left carpal tunnel release.  Patient states his symptoms are gradually progressing with more neck pain and left arm pain and hand numbness.  He also has some back pain.  Has difficulty driving more than 30 miles has stopped to get up walk around.  Has numbness in his arm hand drops objects has trouble sleeping.  He is used hydrocodone off-and-on given by his PCP states it really does not help.  He does have past history of depression and type 2 diabetes with neuropathy. Patient  states he is now ready to consider proceeding with cervical fusion and carpal tunnel release. 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  Cervical spondylosis.  Type 2 diabetes hypertension, right ventricle enlargement, hyperlipidemia, hypothyroidism, history of sleep apnea without obesity.  Possible depression.  The rest of review of systems are noncontributory.   Objective: Vital Signs: BP (!) 143/89   Pulse 61   Ht 6' (1.829 m)   Wt 230 lb (104.3 kg)   BMI 31.19 kg/m   Physical Exam Constitutional:      Appearance: He is well-developed.  HENT:     Head: Normocephalic and atraumatic.  Eyes:     Pupils: Pupils are equal, round, and reactive to light.  Neck:     Thyroid: No thyromegaly.     Trachea: No tracheal deviation.  Cardiovascular:     Rate and Rhythm: Normal rate.  Pulmonary:     Effort: Pulmonary effort is normal.     Breath sounds: No wheezing.  Abdominal:     General: Bowel sounds are normal.     Palpations: Abdomen is soft.  Skin:    General: Skin is warm and dry.     Capillary Refill: Capillary refill takes less than 2 seconds.  Neurological:     Mental  Status: He is alert and oriented to person, place, and time.  Psychiatric:        Behavior: Behavior normal.        Thought Content: Thought content normal.        Judgment: Judgment normal.     Ortho Exam patient has brachial plexus tenderness on the left positive Phalen's test on the left positive carpal compression test on the left.  Left thumb abductor weakness.  Knee and ankle jerk are 2+ and symmetrical.  There is absent left brachioradialis reflex all other upper extremity reflexes are 2+ including right brachial radialis reflex.  No interossei weakness finger flexion and finger extension is intact.  Specialty Comments:  No specialty comments available.  Imaging: No results found.   PMFS History: Patient Active Problem List   Diagnosis Date Noted  . Eczema of both external ears  12/08/2019  . Edema of right lower extremity 12/08/2019  . Polyarthritis of multiple sites 10/27/2019  . Coronary artery calcification seen on CT scan 10/27/2019  . 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 Radiculopathy, cervical region 06/10/2019  . Carpal tunnel syndrome, left upper limb 06/10/2019  . 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 Spinal stenosis of cervical region 12/31/2018  . Loose body of left knee   . Acute medial meniscus tear of left knee   . Plantar fasciitis of left foot 08/24/2017  . Chronic pain of both hips 06/20/2017  . Angioedema of lips 11/26/2016  . 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 Hypothyroidism due to acquired atrophy of thyroid 09/14/2015  . Arthritis of knee 06/23/2014  . Encounter for preventive health examination 06/17/2014  . Enlarged RV (right ventricle) 06/17/2014  . GERD (gastroesophageal reflux disease) 04/20/2014  . Cervical spine degeneration 02/17/2014  . Overweight 10/29/2013  . Chronic knee pain 10/29/2013  . Type 2 diabetes mellitus with diabetic neuropathy, unspecified ([place]) 10/23/2012  . Seborrheic dermatitis, unspecified 05/17/2012  . Fatty liver 04/22/2012  . Hypogonadism male 03/12/2012  . Sciatica of right side 12/24/2010  . Hyperlipidemia associated with type 2 diabetes mellitus ([place]) 01/13/2009  . Anxiety state 01/13/2009  . Essential hypertension 01/13/2009  . OSA (obstructive sleep apnea) 01/13/2009   Past Medical History:  Diagnosis Date  . Alcohol-induced chronic pancreatitis ([place]) 01/09/2019  . Anxiety   . Anxiety and depression   . Arthritis    knees and shoulders  . Benzodiazepine withdrawal with complication ([place]) 6/75/9163  . Bruit    L  . Chest pain    hx  . Cognitive complaints with normal neuropsychological exam 10/23/2016  .  Diabetes mellitus without complication ([place])    "borderline", diet controlled, no meds, patient has lost 30 lbs  . Edema   . Fatty liver   . GERD (gastroesophageal reflux disease)    uses Omeprazole  . Goiter   . HLD (hyperlipidemia)   . Hypertension    essential, benign  . Hypothyroidism   . Impotence of organic origin   . Murmur    never has caused any problems  . Neuromuscular disorder ([place])   . Other chest pain    tightness, pressure  . Palpitation    hx  . Precordial pain   . Sleep apnea    uses cpap    Family History  Problem Relation Age of Onset  . Heart disease Father  Past Surgical History:  Procedure Laterality Date  . APPENDECTOMY  2002   done at [facility]  . CARDIAC CATHETERIZATION    . CARDIAC CATHETERIZATION N/A 08/27/2015   Procedure: Left Heart Cath and Coronary Angiography;  Surgeon: Jolaine Artist, MD;  Location: [facility];  Service: Cardiovascular;  Laterality: N/A;  . COLONOSCOPY    . EAR CYST EXCISION Left 07/08/2019   Procedure: open excision baker's cyst left knee;  Surgeon: Meredith Pel, MD;  Location: [name];  Service: Orthopedics;  Laterality: Left;  . HERNIA REPAIR  5-36-14   umbilical  . JOINT REPLACEMENT     right knee  . KNEE ARTHROSCOPY Left 12/17/2018   Procedure: left knee arthroscopy, meniscal debridement, loose body removal;  Surgeon: Meredith Pel, MD;  Location: [facility];  Service: Orthopedics;  Laterality: Left;  . REVISION TOTAL KNEE ARTHROPLASTY Right 06/23/2014   DR Marlou Sa  . TONSILLECTOMY    . TOTAL KNEE ARTHROPLASTY Right 2011   right  . TOTAL KNEE REVISION Right 06/23/2014   Procedure: TOTAL KNEE REVISION;  Surgeon: Meredith Pel, MD;  Location: [facility];  Service: Orthopedics;  Laterality: Right;  . WISDOM TOOTH EXTRACTION     Social History   Occupational History  . Occupation: IT trainer  . Occupation: SERVICE DIRECTOR    Employer: [employer]  Tobacco Use  . Smoking status: Former Smoker    Packs/day: 2.00    Years: 18.00    Pack years: 36.00    Types: Cigarettes    Quit date: 04/24/2005    Years since quitting: 14.6  . Smokeless tobacco: Never Used  . Tobacco comment: quit 20 years ago   Vaping Use  . Vaping Use: Never used  Substance and Sexual Activity  . Alcohol use: Yes    Alcohol/week: 8.0 - 9.0 standard drinks    Types: 6 Cans of beer, 2 - 3 Shots of liquor per week    Comment: beer/liquor  . Drug use: No  . Sexual activity: Yes

## 2019-12-30 ENCOUNTER — Other Ambulatory Visit: Payer: Self-pay | Admitting: Radiology

## 2019-12-30 DIAGNOSIS — M4802 Spinal stenosis, cervical region: Secondary | ICD-10-CM

## 2019-12-30 DIAGNOSIS — M4722 Other spondylosis with radiculopathy, cervical region: Secondary | ICD-10-CM

## 2020-01-06 ENCOUNTER — Telehealth: Payer: Self-pay

## 2020-01-06 NOTE — Telephone Encounter (Signed)
Called pt to see why he missed his appt with [facility] on 01/02/2020 per Dr. Lupita Dawn request. Pt stated that his wife is in the hospital and was unable to make the appt.

## 2020-01-06 NOTE — Telephone Encounter (Signed)
Please send him their phone number via mychart so he can call to reschedule.

## 2020-01-09 ENCOUNTER — Other Ambulatory Visit: Payer: Self-pay | Admitting: Orthopedic Surgery

## 2020-01-09 NOTE — Telephone Encounter (Signed)
Pls advise.  

## 2020-01-12 ENCOUNTER — Other Ambulatory Visit: Payer: Self-pay

## 2020-01-12 ENCOUNTER — Telehealth: Payer: Self-pay | Admitting: *Deleted

## 2020-01-12 ENCOUNTER — Ambulatory Visit (INDEPENDENT_AMBULATORY_CARE_PROVIDER_SITE_OTHER): Payer: 59

## 2020-01-12 ENCOUNTER — Ambulatory Visit (HOSPITAL_COMMUNITY): Payer: 59 | Admitting: Licensed Clinical Social Worker

## 2020-01-12 ENCOUNTER — Ambulatory Visit (INDEPENDENT_AMBULATORY_CARE_PROVIDER_SITE_OTHER): Payer: 59 | Admitting: Podiatry

## 2020-01-12 DIAGNOSIS — M7751 Other enthesopathy of right foot: Secondary | ICD-10-CM | POA: Diagnosis not present

## 2020-01-12 DIAGNOSIS — R600 Localized edema: Secondary | ICD-10-CM

## 2020-01-12 DIAGNOSIS — M7752 Other enthesopathy of left foot: Secondary | ICD-10-CM

## 2020-01-12 DIAGNOSIS — M2011 Hallux valgus (acquired), right foot: Secondary | ICD-10-CM

## 2020-01-12 DIAGNOSIS — M7742 Metatarsalgia, left foot: Secondary | ICD-10-CM

## 2020-01-12 NOTE — Telephone Encounter (Signed)
I called pt today to find out where he would like to have his MRI done due to his insurance will not cover [facility] to [phone], [phone] where I was going to send him because of being [place]. There are 3 options I can send to. Left message for pt to return my call.

## 2020-01-12 NOTE — Progress Notes (Signed)
HPI: 53 y.o. male presenting today for new complaint regarding left forefoot pain as well as right foot pain.  Patient is currently unemployed however he is having a significant amount of pain and tenderness to the bilateral feet.  He denies injury or trauma to the area.  He has undergone first MTPJ implant and tailor's bunionectomy surgery to the left foot 02/17/2019.  He has a past medical history of diabetes mellitus and hypertension.  Past Medical History:  Diagnosis Date  . Alcohol-induced chronic pancreatitis ([place]) 01/09/2019  . Anxiety   . Anxiety and depression   . Arthritis    knees and shoulders  . Benzodiazepine withdrawal with complication ([place]) 2/59/5638  . Bruit    L  . Chest pain    hx  . Cognitive complaints with normal neuropsychological exam 10/23/2016  . Diabetes mellitus without complication ([place])    "borderline", diet controlled, no meds, patient has lost 30 lbs  . Edema   . Fatty liver   . GERD (gastroesophageal reflux disease)    uses Omeprazole  . Goiter   . HLD (hyperlipidemia)   . Hypertension    essential, benign  . Hypothyroidism   . Impotence of organic origin   . Murmur    never has caused any problems  . Neuromuscular disorder ([place])   . Other chest pain    tightness, pressure  . Palpitation    hx  . Precordial pain   . Sleep apnea    uses cpap     Physical Exam: General: The patient is alert and oriented x3 in no acute distress.  Dermatology: Skin is warm, dry and supple bilateral lower extremities. Negative for open lesions or macerations.  Vascular: Palpable pedal pulses bilaterally.  Bilateral foot and ankle edema is noted.  Pitting edema.  No erythema noted.  Capillary refill within normal limits.  Neurological: Epicritic and protective threshold grossly intact bilaterally.   Musculoskeletal Exam: Range of motion within normal limits to all pedal and ankle joints bilateral. Muscle strength 5/5 in all groups bilateral.  Pain on  palpation and range of motion noted to the first MTPJ right and fifth MTPJ left  Radiographic Exam:  Normal osseous mineralization. Joint spaces preserved. No fracture/dislocation/boney destruction.  The orthopedic screw and the implant to the left foot appears to be stable and intact.  There is only a mild increase in the intermetatarsal space to the right foot.  Assessment: 1.  Fifth MTPJ capsulitis left 2.  First MTPJ capsulitis right 3.  Metatarsalgia left 4.  Edema bilateral feet  5.  History of first MTPJ implant with tailor's bunionectomy left.  DOS: 02/17/2019   Plan of Care:  1. Patient evaluated. X-Rays reviewed.  2.  Injection of 0.5 cc Celestone Soluspan injected into the first MTPJ right and fifth MTPJ left 3.  Patient is on active chronic pain management from his PCP.  Continue hydrocodone prescription as per PCP as needed pain 4.  Recommend good supportive shoes 5.  Compression ankle sleeves were dispensed to help with the edema of the bilateral feet.  Recommend follow-up with PCP for lower extremity edema 6.  Return to clinic in 4 weeks  *Currently unemployed     Edrick Kins, DPM [facility]  Dr. Edrick Kins, DPM    [address]  [place], [place] [address]                Office [phone]  Fax [phone]

## 2020-01-15 NOTE — Telephone Encounter (Signed)
Pt called back and states he is closer to [facility] on [address]

## 2020-01-19 ENCOUNTER — Other Ambulatory Visit: Payer: Self-pay | Admitting: Internal Medicine

## 2020-01-20 MED ORDER — HYDROCODONE-ACETAMINOPHEN 10-325 MG PO TABS: 1.0000 | ORAL_TABLET | Freq: Two times a day (BID) | ORAL | 0 refills | Status: DC | PRN

## 2020-01-21 ENCOUNTER — Encounter: Payer: Self-pay | Admitting: Orthopaedic Surgery

## 2020-01-21 ENCOUNTER — Ambulatory Visit (INDEPENDENT_AMBULATORY_CARE_PROVIDER_SITE_OTHER): Payer: 59 | Admitting: Orthopaedic Surgery

## 2020-01-21 VITALS — BP 123/78 | HR 64

## 2020-01-21 DIAGNOSIS — M4722 Other spondylosis with radiculopathy, cervical region: Secondary | ICD-10-CM

## 2020-01-21 DIAGNOSIS — G5602 Carpal tunnel syndrome, left upper limb: Secondary | ICD-10-CM

## 2020-01-21 NOTE — Progress Notes (Signed)
Office Visit Note   Patient: [name]           Date of Birth: 1966/01/20           MRN: [id number] Visit Date: 01/21/2020              Requested by: [name], MD [address] [place],  [place] [address] PCP: [name], MD   Assessment & Plan: Visit Diagnoses:  1. Osteoarthritis of spine with radiculopathy, cervical region   2. Carpal tunnel syndrome, left upper limb     Plan: Patient will get his disc bring it by so we can review the images.  I am unsure if the disc at C5-6 resolved.  He did have significant foraminal stenosis which would not have resolved.  There may be difference in radiologist labeling different levels which is unlikely as well.  He certainly does have carpal tunnel syndrome with positive electrical tests.  Will check him in a week and review his images at that time and then make a determination about surgical recommendations.  Follow-Up Instructions: Return in about 1 week (around 01/28/2020).   Orders:  No orders of the defined types were placed in this encounter.  No orders of the defined types were placed in this encounter.     Procedures: No procedures performed   Clinical Data: No additional findings.   Subjective: Chief Complaint  Patient presents with  . Neck - Pain    HPI 54 year old male returns with ongoing problems with left hand numbness thumb index and long finger.  He drops objects.  Pain wakes him up at night has been taking hydrocodone for pain.  He also has problems with his neck and had previous cervical MRI scan in 2015 and then follow-up scan 12/06/2018 which I reviewed with him previously which showed moderate spinal stenosis with spondylosis and severe left neural foraminal narrowing at C5-6.  Patient switched insurance.  He had nerve conduction velocities which showed moderate carpal tunnel syndrome on the left as well as mild chronic C6 radiculopathy on the left.  Patient's insurance sent him for  an MRI with [facility] and this was read as C5-6 level being normal.  There is mild disc bulge at C4-5 with with no foraminal stenosis.  Mild facet arthropathy on the left at C6-7 without spinal or foraminal stenosis.  Patient did not bring the disc I am unable to see the images.  Patient states she has more problems with hand numbness and pain from the elbow down then left side neck pain and shoulder pain that radiates down his arm.  No gait disturbance.  Review of Systems 14 point update unchanged from 12/23/2019 office visit.   Objective: Vital Signs: BP 123/78   Pulse 64   Physical Exam Constitutional:      Appearance: He is well-developed.  HENT:     Head: Normocephalic and atraumatic.  Eyes:     Pupils: Pupils are equal, round, and reactive to light.  Neck:     Thyroid: No thyromegaly.     Trachea: No tracheal deviation.  Cardiovascular:     Rate and Rhythm: Normal rate.  Pulmonary:     Effort: Pulmonary effort is normal.     Breath sounds: No wheezing.  Abdominal:     General: Bowel sounds are normal.     Palpations: Abdomen is soft.  Skin:    General: Skin is warm and dry.     Capillary Refill: Capillary  refill takes less than 2 seconds.  Neurological:     Mental Status: He is alert and oriented to person, place, and time.  Psychiatric:        Behavior: Behavior normal.        Thought Content: Thought content normal.        Judgment: Judgment normal.     Ortho Exam patient has positive Tinel's positive Phalen's on the left.  He still has brachial plexus tenderness on the left.  Absent brachioradialis reflex on the left all others are 2+.  No ulnar weakness or interosseous atrophy and normal ulnar sensory right and left.  No lower extremity clonus. Specialty Comments:  No specialty comments available.  Imaging: No results found.   PMFS History: Patient Active Problem List   Diagnosis Date Noted  . Eczema of both external ears 12/08/2019  . Edema of right lower  extremity 12/08/2019  . Polyarthritis of multiple sites 10/27/2019  . Coronary artery calcification seen on CT scan 10/27/2019  . 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 Radiculopathy, cervical region 06/10/2019  . Carpal tunnel syndrome, left upper limb 06/10/2019  . 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 Spinal stenosis of cervical region 12/31/2018  . Loose body of left knee   . Acute medial meniscus tear of left knee   . Plantar fasciitis of left foot 08/24/2017  . Chronic pain of both hips 06/20/2017  . Angioedema of lips 11/26/2016  . 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 Hypothyroidism due to acquired atrophy of thyroid 09/14/2015  . Arthritis of knee 06/23/2014  . Encounter for preventive health examination 06/17/2014  . Enlarged RV (right ventricle) 06/17/2014  . GERD (gastroesophageal reflux disease) 04/20/2014  . Cervical spine degeneration 02/17/2014  . Overweight 10/29/2013  . Chronic knee pain 10/29/2013  . Type 2 diabetes mellitus with diabetic neuropathy, unspecified ([place]) 10/23/2012  . Seborrheic dermatitis, unspecified 05/17/2012  . Fatty liver 04/22/2012  . Hypogonadism male 03/12/2012  . Sciatica of right side 12/24/2010  . Hyperlipidemia associated with type 2 diabetes mellitus ([place]) 01/13/2009  . Anxiety state 01/13/2009  . Essential hypertension 01/13/2009  . OSA (obstructive sleep apnea) 01/13/2009   Past Medical History:  Diagnosis Date  . Alcohol-induced chronic pancreatitis ([place]) 01/09/2019  . Anxiety   . Anxiety and depression   . Arthritis    knees and shoulders  . Benzodiazepine withdrawal with complication ([place]) 1/60/7371  . Bruit    L  . Chest pain    hx  . Cognitive complaints with normal neuropsychological exam 10/23/2016  . Diabetes mellitus without complication  ([place])    "borderline", diet controlled, no meds, patient has lost 30 lbs  . Edema   . Fatty liver   . GERD (gastroesophageal reflux disease)    uses Omeprazole  . Goiter   . HLD (hyperlipidemia)   . Hypertension    essential, benign  . Hypothyroidism   . Impotence of organic origin   . Murmur    never has caused any problems  . Neuromuscular disorder ([place])   . Other chest pain    tightness, pressure  . Palpitation    hx  . Precordial pain   . Sleep apnea    uses cpap    Family History  Problem Relation Age of Onset  . Heart disease Father     Past Surgical History:  Procedure Laterality Date  . APPENDECTOMY  2002   done at [facility]  . CARDIAC CATHETERIZATION    . CARDIAC CATHETERIZATION N/A 08/27/2015   Procedure: Left Heart Cath and Coronary Angiography;  Surgeon: Jolaine Artist, MD;  Location: [facility];  Service: Cardiovascular;  Laterality: N/A;  . COLONOSCOPY    . EAR CYST EXCISION Left 07/08/2019   Procedure: open excision baker's cyst left knee;  Surgeon: Meredith Pel, MD;  Location: [facility];  Service: Orthopedics;  Laterality: Left;  . HERNIA REPAIR  8-58-85   umbilical  . JOINT REPLACEMENT     right knee  . KNEE ARTHROSCOPY Left 12/17/2018   Procedure: left knee arthroscopy, meniscal debridement, loose body removal;  Surgeon: Meredith Pel, MD;  Location: [facility];  Service: Orthopedics;  Laterality: Left;  . REVISION TOTAL KNEE ARTHROPLASTY Right 06/23/2014   DR Marlou Sa  . TONSILLECTOMY    . TOTAL KNEE ARTHROPLASTY Right 2011   right  . TOTAL KNEE REVISION Right 06/23/2014   Procedure: TOTAL KNEE REVISION;  Surgeon: Meredith Pel, MD;  Location: [facility];  Service: Orthopedics;  Laterality: Right;  . WISDOM TOOTH EXTRACTION     Social History   Occupational History  . Occupation: IT trainer  . Occupation: SERVICE DIRECTOR    Employer: [employer]  Tobacco Use  . Smoking status:  Former Smoker    Packs/day: 2.00    Years: 18.00    Pack years: 36.00    Types: Cigarettes    Quit date: 04/24/2005    Years since quitting: 14.7  . Smokeless tobacco: Never Used  . Tobacco comment: quit 20 years ago   Vaping Use  . Vaping Use: Never used  Substance and Sexual Activity  . Alcohol use: Yes    Alcohol/week: 8.0 - 9.0 standard drinks    Types: 6 Cans of beer, 2 - 3 Shots of liquor per week    Comment: beer/liquor  . Drug use: No  . Sexual activity: Yes

## 2020-01-26 ENCOUNTER — Other Ambulatory Visit: Payer: Self-pay | Admitting: Internal Medicine

## 2020-01-28 ENCOUNTER — Telehealth (HOSPITAL_COMMUNITY): Payer: 59 | Admitting: Psychiatry

## 2020-01-28 ENCOUNTER — Encounter: Payer: Self-pay | Admitting: Orthopaedic Surgery

## 2020-01-28 ENCOUNTER — Encounter (HOSPITAL_COMMUNITY): Payer: Self-pay | Admitting: Psychiatry

## 2020-01-28 ENCOUNTER — Ambulatory Visit (INDEPENDENT_AMBULATORY_CARE_PROVIDER_SITE_OTHER): Payer: 59 | Admitting: Orthopaedic Surgery

## 2020-01-28 ENCOUNTER — Telehealth (INDEPENDENT_AMBULATORY_CARE_PROVIDER_SITE_OTHER): Payer: 59 | Admitting: Psychiatry

## 2020-01-28 ENCOUNTER — Other Ambulatory Visit: Payer: Self-pay

## 2020-01-28 VITALS — Wt 230.0 lb

## 2020-01-28 VITALS — BP 128/77 | HR 71 | Ht 72.0 in | Wt 230.0 lb

## 2020-01-28 DIAGNOSIS — F419 Anxiety disorder, unspecified: Secondary | ICD-10-CM

## 2020-01-28 DIAGNOSIS — M4722 Other spondylosis with radiculopathy, cervical region: Secondary | ICD-10-CM | POA: Diagnosis not present

## 2020-01-28 DIAGNOSIS — F331 Major depressive disorder, recurrent, moderate: Secondary | ICD-10-CM

## 2020-01-28 DIAGNOSIS — G5602 Carpal tunnel syndrome, left upper limb: Secondary | ICD-10-CM

## 2020-01-28 MED ORDER — BUPROPION HCL ER (XL) 300 MG PO TB24: 300.0000 mg | ORAL_TABLET | Freq: Every day | ORAL | 2 refills | Status: DC

## 2020-01-28 MED ORDER — ARIPIPRAZOLE 5 MG PO TABS: 5.0000 mg | ORAL_TABLET | Freq: Every day | ORAL | 2 refills | Status: DC

## 2020-01-28 NOTE — Progress Notes (Signed)
Virtual Visit via Telephone Note  I connected with [name] on 01/28/20 at  2:00 PM EDT by telephone and verified that I am speaking with the correct person using two identifiers.  Location: Patient: home Provider: home office   I discussed the limitations, risks, security and privacy concerns of performing an evaluation and management service by telephone and the availability of in person appointments. I also discussed with the patient that there may be a patient responsible charge related to this service. The patient expressed understanding and agreed to proceed.   History of Present Illness: Patient is evaluated by phone session.  He is taking Abilify and Wellbutrin and reported no side effects.  He is also getting Klonopin, gabapentin, trazodone and BuSpar from his PCP.  Patient told since the last visit his wife been hospitalized few times and it was stressful.  He admitted sometimes feeling sad and dysphoria because of his wife's condition but he feels the current medicine is helping.  He does not want to change medication.  He sleeps at least 6 to 7 hours.  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  Patient is in therapy with Charolotte Eke.  He recently had a visit with his PCP and there was no changes in the medication.  Patient denies any suicidal thoughts or homicidal thoughts.  His appetite is okay.  His energy level is okay.  Denies any panic attack.   Past Psychiatric History:Reviewed. H/Odepression and anxiety. No h/osuicidal attempt or inpatient treatment. Seen Dr. Jake Michaelis and given Zoloft, Klonopin and Cymbalta. Later PCP added BuSpar and trazodone.We tried Lamictal but did not help.  Psychiatric Specialty Exam: Physical Exam  Review of Systems  Weight 230 lb (104.3 kg).There is no height or weight on file to calculate BMI.  General Appearance: NA  Eye Contact:  NA  Speech:  Clear and Coherent and Slow  Volume:  Normal  Mood:  Dysphoric   Affect:  NA  Thought Process:  Goal Directed  Orientation:  Full (Time, Place, and Person)  Thought Content:  Logical  Suicidal Thoughts:  No  Homicidal Thoughts:  No  Memory:  Immediate;   Good Recent;   Fair Remote;   Fair  Judgement:  Intact  Insight:  Present  Psychomotor Activity:  NA  Concentration:  Concentration: Fair and Attention Span: Fair  Recall:  Good  Fund of Knowledge:  Good  Language:  Good  Akathisia:  No  Handed:  Right  AIMS (if indicated):     Assets:  Communication Skills Desire for Improvement Housing Resilience  ADL's:  Intact  Cognition:  WNL  Sleep:   6-7 hrs      Assessment and Plan: Major depressive disorder, recurrent.  Anxiety.  Patient is taking multiple medication but also reporting that he is stable and does not want to change.  We will continue Abilify 5 mg daily and Wellbutrin XL 300 mg daily.  He is taking BuSpar, gabapentin, Klonopin and trazodone from his PCP.  We discussed side effects and polypharmacy but patient is comfortable taking psychotropic medication.  Encouraged to continue therapy with Charolotte Eke.  Recommended to call us If he has any question or any concern.  Follow-up in 3 months.  Follow Up Instructions:    I discussed the assessment and treatment plan with the patient. The patient was provided an opportunity to ask questions and all were answered. The patient agreed with the plan and demonstrated an understanding of  the instructions.   The patient was advised to call back or seek an in-person evaluation if the symptoms worsen or if the condition fails to improve as anticipated.  I provided 12 minutes of non-face-to-face time during this encounter.   Kathlee Nations, MD

## 2020-01-28 NOTE — Progress Notes (Signed)
Office Visit Note   Patient: [name]           Date of Birth: [birth year]           MRN: [id number] Visit Date: 01/28/2020              Requested by: [name], MD [address] [place],  [place] [address] PCP: [name], MD   Assessment & Plan: Visit Diagnoses:  1. Carpal tunnel syndrome, left upper limb   2. Osteoarthritis of spine with radiculopathy, cervical region     Plan: Patient will make his third trip back to the scanner that was required by his insurance.  He will take the disc they will need to download the images so they can be opened and viewed.  Again as I asked him last time at his last visit he will make sure that they can open and actually see the images before they handed this to him and he can drop it by here so I can review it and call him.  We will make a decision about cervical plus carpal tunnel surgery or just carpal tunnel surgery alone.  Follow-Up Instructions: No follow-ups on file.   Orders:  No orders of the defined types were placed in this encounter.  No orders of the defined types were placed in this encounter.     Procedures: No procedures performed   Clinical Data: No additional findings.   Subjective: Chief Complaint  Patient presents with   Neck - Pain   Left Hand - Pain    HPI 54 year old male returns this is a second visit after having an MRI scan done out of town at [facility] and this time he brought a disc.  I have asked him to have them open the disc to make sure the images could be visualized and he brought a disc but there is no accompanying software on the disc in order to open the images.  It appears the images are present on the disc and megabytes but no associated program to open it.  Multiple attempts on multiple different computers were unsuccessful.  Patient continues to have positive carpal tunnel complaints in his left hand and did have positive electrical test for carpal tunnel.  Previous  MRI showed moderate Spondylosis C5-6 with foraminal narrowing severe on the left and the readout for the most recent MRI which I have not been able to see the images stated that the C5-6 level was normal.  His electrical test did show chronic C6 left nerve root changes in addition to the moderately severe carpal tunnel on the left.  Review of Systems unchanged from last visit.   Objective: Vital Signs: BP 128/77 (BP Location: Left Arm, Patient Position: Sitting, Cuff Size: Large)    Pulse 71    Ht 6' (1.829 m)    Wt 230 lb (104.3 kg)    BMI 31.19 kg/m   Physical Exam Constitutional:      Appearance: He is well-developed.  HENT:     Head: Normocephalic and atraumatic.  Eyes:     Pupils: Pupils are equal, round, and reactive to light.  Neck:     Thyroid: No thyromegaly.     Trachea: No tracheal deviation.  Cardiovascular:     Rate and Rhythm: Normal rate.  Pulmonary:     Effort: Pulmonary effort is normal.     Breath sounds: No wheezing.  Abdominal:     General: Bowel sounds are  normal.     Palpations: Abdomen is soft.  Skin:    General: Skin is warm and dry.     Capillary Refill: Capillary refill takes less than 2 seconds.  Neurological:     Mental Status: He is alert and oriented to person, place, and time.  Psychiatric:        Behavior: Behavior normal.        Thought Content: Thought content normal.        Judgment: Judgment normal.     Ortho Exam patient has brachial plexus tenderness on the left positive Phalen's on the left positive carpal compression on the left.  Some abductor weakness on the left.  Specialty Comments:  No specialty comments available.  Imaging: No results found.   PMFS History: Patient Active Problem List   Diagnosis Date Noted   Eczema of both external ears 12/08/2019   Edema of right lower extremity 12/08/2019   Polyarthritis of multiple sites 10/27/2019   Coronary artery calcification seen on CT scan 10/27/2019   Controlled type  2 diabetes mellitus with microalbuminuric diabetic nephropathy ([place]) 08/26/2019   Radiculopathy, cervical region 06/10/2019   Carpal tunnel syndrome, left upper limb 06/10/2019   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   Spinal stenosis of cervical region 12/31/2018   Loose body of left knee    Acute medial meniscus tear of left knee    Plantar fasciitis of left foot 08/24/2017   Chronic pain of both hips 06/20/2017   Angioedema of lips 11/26/2016   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 09/09/2016   Complete rotator cuff tear of left shoulder 09/14/2015   Hypothyroidism due to acquired atrophy of thyroid 09/14/2015   Arthritis of knee 06/23/2014   Encounter for preventive health examination 06/17/2014   Enlarged RV (right ventricle) 06/17/2014   GERD (gastroesophageal reflux disease) 04/20/2014   Cervical spine degeneration 02/17/2014   Overweight 10/29/2013   Chronic knee pain 10/29/2013   Type 2 diabetes mellitus with diabetic neuropathy, unspecified ([place]) 10/23/2012   Seborrheic dermatitis, unspecified 05/17/2012   Fatty liver 04/22/2012   Hypogonadism male 03/12/2012   Sciatica of right side 12/24/2010   Hyperlipidemia associated with type 2 diabetes mellitus ([place]) 01/13/2009   Anxiety state 01/13/2009   Essential hypertension 01/13/2009   OSA (obstructive sleep apnea) 01/13/2009   Past Medical History:  Diagnosis Date   Alcohol-induced chronic pancreatitis ([place]) 01/09/2019   Anxiety    Anxiety and depression    Arthritis    knees and shoulders   Benzodiazepine withdrawal with complication ([place]) 3/71/6967   Bruit    L   Chest pain    hx   Cognitive complaints with normal neuropsychological exam 10/23/2016   Diabetes mellitus without complication ([facility])    "borderline", diet controlled, no meds, patient has lost 30 lbs   Edema    Fatty liver    GERD (gastroesophageal reflux disease)     uses Omeprazole   Goiter    HLD (hyperlipidemia)    Hypertension    essential, benign   Hypothyroidism    Impotence of organic origin    Murmur    never has caused any problems   Neuromuscular disorder ([place])    Other chest pain    tightness, pressure   Palpitation    hx   Precordial pain    Sleep apnea    uses cpap    Family History  Problem Relation Age of Onset   Heart disease Father  Past Surgical History:  Procedure Laterality Date   APPENDECTOMY  2002   done at [facility] N/A 08/27/2015   Procedure: Left Heart Cath and Coronary Angiography;  Surgeon: Jolaine Artist, MD;  Location: [facility];  Service: Cardiovascular;  Laterality: N/A;   COLONOSCOPY     EAR CYST EXCISION Left 07/08/2019   Procedure: open excision baker's cyst left knee;  Surgeon: Meredith Pel, MD;  Location: [facility];  Service: Orthopedics;  Laterality: Left;   HERNIA REPAIR  08-01-79   umbilical   JOINT REPLACEMENT     right knee   KNEE ARTHROSCOPY Left 12/17/2018   Procedure: left knee arthroscopy, meniscal debridement, loose body removal;  Surgeon: Meredith Pel, MD;  Location: [facility];  Service: Orthopedics;  Laterality: Left;   REVISION TOTAL KNEE ARTHROPLASTY Right 06/23/2014   DR DEAN   TONSILLECTOMY     TOTAL KNEE ARTHROPLASTY Right 2011   right   TOTAL KNEE REVISION Right 06/23/2014   Procedure: TOTAL KNEE REVISION;  Surgeon: Meredith Pel, MD;  Location: [facility];  Service: Orthopedics;  Laterality: Right;   WISDOM TOOTH EXTRACTION     Social History   Occupational History   Occupation: Personnel officer: Public relations account executive   Occupation: SERVICE DIRECTOR    Employer: [employer]  Tobacco Use   Smoking status: Former Smoker    Packs/day: 2.00    Years: 18.00    Pack years: 36.00    Types: Cigarettes    Quit date: 04/24/2005    Years since quitting: 14.7    Smokeless tobacco: Never Used   Tobacco comment: quit 20 years ago   Vaping Use   Vaping Use: Never used  Substance and Sexual Activity   Alcohol use: Yes    Alcohol/week: 8.0 - 9.0 standard drinks    Types: 6 Cans of beer, 2 - 3 Shots of liquor per week    Comment: beer/liquor   Drug use: No   Sexual activity: Yes

## 2020-01-29 ENCOUNTER — Telehealth (HOSPITAL_COMMUNITY): Payer: 59 | Admitting: Psychiatry

## 2020-01-30 ENCOUNTER — Encounter: Payer: Self-pay | Admitting: Gastroenterology

## 2020-02-02 ENCOUNTER — Telehealth: Payer: Self-pay | Admitting: Internal Medicine

## 2020-02-02 DIAGNOSIS — Z1211 Encounter for screening for malignant neoplasm of colon: Secondary | ICD-10-CM | POA: Insufficient documentation

## 2020-02-04 ENCOUNTER — Ambulatory Visit: Payer: 59 | Admitting: Orthopedic Surgery

## 2020-02-09 ENCOUNTER — Ambulatory Visit: Payer: 59 | Admitting: Podiatry

## 2020-02-13 ENCOUNTER — Other Ambulatory Visit: Payer: Self-pay | Admitting: Internal Medicine

## 2020-02-13 DIAGNOSIS — F331 Major depressive disorder, recurrent, moderate: Secondary | ICD-10-CM

## 2020-02-13 DIAGNOSIS — F411 Generalized anxiety disorder: Secondary | ICD-10-CM

## 2020-02-25 ENCOUNTER — Other Ambulatory Visit: Payer: Self-pay | Admitting: Internal Medicine

## 2020-02-25 MED ORDER — HYDROCODONE-ACETAMINOPHEN 10-325 MG PO TABS
1.0000 | ORAL_TABLET | Freq: Two times a day (BID) | ORAL | 0 refills | Status: DC | PRN
Start: 2020-02-25 — End: 2020-04-06

## 2020-03-10 ENCOUNTER — Telehealth (INDEPENDENT_AMBULATORY_CARE_PROVIDER_SITE_OTHER): Payer: 59 | Admitting: Internal Medicine

## 2020-03-10 ENCOUNTER — Encounter: Payer: Self-pay | Admitting: Internal Medicine

## 2020-03-10 VITALS — Ht 72.0 in | Wt 222.0 lb

## 2020-03-10 DIAGNOSIS — M4802 Spinal stenosis, cervical region: Secondary | ICD-10-CM | POA: Diagnosis not present

## 2020-03-10 DIAGNOSIS — E785 Hyperlipidemia, unspecified: Secondary | ICD-10-CM

## 2020-03-10 DIAGNOSIS — M5412 Radiculopathy, cervical region: Secondary | ICD-10-CM

## 2020-03-10 DIAGNOSIS — M5386 Other specified dorsopathies, lumbar region: Secondary | ICD-10-CM

## 2020-03-10 DIAGNOSIS — E1140 Type 2 diabetes mellitus with diabetic neuropathy, unspecified: Secondary | ICD-10-CM | POA: Diagnosis not present

## 2020-03-10 DIAGNOSIS — E1169 Type 2 diabetes mellitus with other specified complication: Secondary | ICD-10-CM | POA: Diagnosis not present

## 2020-03-10 DIAGNOSIS — F0632 Mood disorder due to known physiological condition with major depressive-like episode: Secondary | ICD-10-CM

## 2020-03-10 DIAGNOSIS — E034 Atrophy of thyroid (acquired): Secondary | ICD-10-CM

## 2020-03-10 MED ORDER — CLONAZEPAM 1 MG PO TABS
ORAL_TABLET | ORAL | 5 refills | Status: DC
Start: 2020-03-10 — End: 2020-09-02

## 2020-03-10 NOTE — Progress Notes (Signed)
Virtual Visit converted to Telephone  Note  This visit type was conducted due to national recommendations for restrictions regarding the COVID-19 pandemic (e.g. social distancing).  This format is felt to be most appropriate for this patient at this time.  All issues noted in this document were discussed and addressed.  No physical exam was performed (except for noted visual exam findings with Video Visits).   I connected with@ on 03/10/20 at  9:00 AM EST by a video enabled telemedicine application  and verified that I am speaking with the correct person using two identifiers. Location patient: home Location provider: work or home office Persons participating in the virtual visit: patient, provider  I discussed the limitations, risks, security and privacy concerns of performing an evaluation and management service by telephone and the availability of in person appointments. I also discussed with the patient that there may be a patient responsible charge related to this service. The patient expressed understanding and agreed to proceed.  Interactive audio and video telecommunications were attempted between this provider and patient, however failed, due to patient having technical difficulties .  We continued and completed visit with audio only.   Reason for visit: medication refill; pain management   HPI:  54 yr old male with TYPE 2 DM, CHRONIC PAIN INVOLVING MULTIPLE JOINTS AND SPINE, and depression   He reports that his pain continues to be activity limiting and constant.  He reports persistent numbness of the left foot ,  Tingling of both arms which is brought on by raising arms.  He cannot sit comfortably for  MORE THAN 30 MINUTES due to increased pain in lower back. He is  Using prescribed hydrocodone/apap 1-2 TIMES FOR PAIN MANAGEMENT.    He has been diagnosed with CTS of the left wrist and is waiting  UNTIL AFTER THE HOLIDAYS TO SCHEDULE  SURGERY WITH DR Lorin Mercy  Depression/anxiety:   Aggravated by chronic disabling pain,  Financial hardship,  Wife Tonya's frequent hospitalizations for heart failure.   meds reviewed  Last psychiatry OV note reviewed with Dr. Adele Schilder in October.  Dr Adele Schilder is aware that he is taking clonazepam 1 mg bid and discussed medication changes with [name] but did not make changes due to patient feeling stable on current regimen.   Type 2 dm:  Reviewed dietary  control.   Tolerating metformin,  Not checking sugars daily.  Last  a1c 6.6 in July.    ROS: See pertinent positives and negatives per HPI.  Past Medical History:  Diagnosis Date  . Alcohol-induced chronic pancreatitis ([place]) 01/09/2019  . Anxiety   . Anxiety and depression   . Arthritis    knees and shoulders  . Benzodiazepine withdrawal with complication ([place]) 0/86/5784  . Bruit    L  . Chest pain    hx  . Cognitive complaints with normal neuropsychological exam 10/23/2016  . Diabetes mellitus without complication ([place])    "borderline", diet controlled, no meds, patient has lost 30 lbs  . Edema   . Fatty liver   . GERD (gastroesophageal reflux disease)    uses Omeprazole  . Goiter   . HLD (hyperlipidemia)   . Hypertension    essential, benign  . Hypothyroidism   . Impotence of organic origin   . Murmur    never has caused any problems  . Neuromuscular disorder ([place])   . Other chest pain    tightness, pressure  . Palpitation    hx  . Precordial pain   .  Sleep apnea    uses cpap    Past Surgical History:  Procedure Laterality Date  . APPENDECTOMY  2002   done at [facility]  . CARDIAC CATHETERIZATION    . CARDIAC CATHETERIZATION N/A 08/27/2015   Procedure: Left Heart Cath and Coronary Angiography;  Surgeon: Jolaine Artist, MD;  Location: [facility];  Service: Cardiovascular;  Laterality: N/A;  . COLONOSCOPY    . EAR CYST EXCISION Left 07/08/2019   Procedure: open excision baker's cyst left knee;  Surgeon: Meredith Pel, MD;  Location: [place];  Service: Orthopedics;   Laterality: Left;  . HERNIA REPAIR  2-70-78   umbilical  . JOINT REPLACEMENT     right knee  . KNEE ARTHROSCOPY Left 12/17/2018   Procedure: left knee arthroscopy, meniscal debridement, loose body removal;  Surgeon: Meredith Pel, MD;  Location: [place];  Service: Orthopedics;  Laterality: Left;  . REVISION TOTAL KNEE ARTHROPLASTY Right 06/23/2014   DR Marlou Sa  . TONSILLECTOMY    . TOTAL KNEE ARTHROPLASTY Right 2011   right  . TOTAL KNEE REVISION Right 06/23/2014   Procedure: TOTAL KNEE REVISION;  Surgeon: Meredith Pel, MD;  Location: [place];  Service: Orthopedics;  Laterality: Right;  . WISDOM TOOTH EXTRACTION      Family History  Problem Relation Age of Onset  . Heart disease Father     SOCIAL HX:  reports that he quit smoking about 14 years ago. His smoking use included cigarettes. He has a 36.00 pack-year smoking history. He has never used smokeless tobacco. He reports current alcohol use of about 8.0 - 9.0 standard drinks of alcohol per week. He reports that he does not use drugs.  Current Outpatient Medications:  .  amLODipine (NORVASC) 5 MG tablet, Take 1 tablet (5 mg total) by mouth daily., Disp: 90 tablet, Rfl: 3 .  ARIPiprazole (ABILIFY) 5 MG tablet, Take 1 tablet (5 mg total) by mouth at bedtime., Disp: 30 tablet, Rfl: 2 .  atorvastatin (LIPITOR) 20 MG tablet, Take 1 tablet (20 mg total) by mouth daily., Disp: 90 tablet, Rfl: 3 .  blood glucose meter kit and supplies, Dispense based on patient and insurance preference. Use to check blood sugars once daily., Disp: 1 each, Rfl: 0 .  buPROPion (WELLBUTRIN XL) 300 MG 24 hr tablet, Take 1 tablet (300 mg total) by mouth daily., Disp: 30 tablet, Rfl: 2 .  busPIRone (BUSPAR) 15 MG tablet, TAKE 1 TABLET BY MOUTH THREE TIMES DAILY, Disp: 90 tablet, Rfl: 0 .  celecoxib (CELEBREX) 100 MG capsule, TAKE 1 CAPSULE BY MOUTH TWICE DAILY FOR 10 DAYS THEN 1 ONCE DAILY AS NEEDED, Disp: 30 capsule, Rfl: 0 .  clobetasol (TEMOVATE) 0.05 %  external solution, Apply 1 application topically 2 (two) times daily as needed (scalp irritation)., Disp: , Rfl:  .  Clobetasol Propionate 0.05 % shampoo, Apply to dry scalp and leave on for 15 minutes (Patient taking differently: Apply 1 application topically 2 (two) times a week. Apply to dry scalp and leave on for 15 minutes as needed for itching.), Disp: 118 mL, Rfl: 5 .  clonazePAM (KLONOPIN) 1 MG tablet, TAKE 1 TABLET BY MOUTH TWICE DAILY AS NEEDED FOR ANXIETY, Disp: 60 tablet, Rfl: 5 .  dicyclomine (BENTYL) 20 MG tablet, TAKE 1 TABLET BY MOUTH EVERY 6 HOURS, Disp: 120 tablet, Rfl: 0 .  gabapentin (NEURONTIN) 600 MG tablet, Take 1 tablet (600 mg total) by mouth 3 (three) times daily., Disp: 270 tablet, Rfl:  0 .  glucose blood test strip, Use once daily to check BS at various times  E11.9, Disp: 100 each, Rfl: 12 .  HYDROcodone-acetaminophen (NORCO) 10-325 MG tablet, Take 1 tablet by mouth 2 (two) times daily as needed., Disp: 60 tablet, Rfl: 0 .  levothyroxine (EUTHYROX) 100 MCG tablet, TAKE 1 TABLET BY MOUTH ONCE DAILY BEFORE BREAKFAST, Disp: 90 tablet, Rfl: 1 .  losartan (COZAAR) 100 MG tablet, Take 1 tablet (100 mg total) by mouth daily., Disp: 90 tablet, Rfl: 3 .  metFORMIN (GLUCOPHAGE) 500 MG tablet, Take 1 tablet (500 mg total) by mouth 2 (two) times daily with a meal., Disp: 180 tablet, Rfl: 3 .  methocarbamol (ROBAXIN) 500 MG tablet, Take 1 tablet (500 mg total) by mouth every 8 (eight) hours as needed., Disp: 90 tablet, Rfl: 11 .  metoprolol tartrate (LOPRESSOR) 25 MG tablet, Take 1 tablet (25 mg total) by mouth 2 (two) times daily., Disp: 180 tablet, Rfl: 1 .  mometasone (ELOCON) 0.1 % cream, Apply 1 application topically daily. To ear canal as needed for itching, Disp: 45 g, Rfl: 0 .  Multiple Vitamin (MULTIVITAMIN WITH MINERALS) TABS tablet, Take 1 tablet by mouth daily., Disp: , Rfl:  .  omeprazole (PRILOSEC) 40 MG capsule, Take 1 capsule (40 mg total) by mouth in the morning and at  bedtime., Disp: 180 capsule, Rfl: 1 .  promethazine (PHENERGAN) 12.5 MG tablet, TAKE 1 TABLET BY MOUTH EVERY 8 HOURS AS NEEDED FOR NAUSEA AND  VOMITING, Disp: 20 tablet, Rfl: 0 .  SUPER B COMPLEX/C PO, Take 1 tablet by mouth daily., Disp: , Rfl:  .  traZODone (DESYREL) 100 MG tablet, Take 1.5 tablets (150 mg total) by mouth at bedtime., Disp: 135 tablet, Rfl: 2  EXAM:  VITALS per patient if applicable:  GENERAL: alert, oriented, appears well and in no acute distress  HEENT: atraumatic, conjunttiva clear, no obvious abnormalities on inspection of external nose and ears  NECK: normal movements of the head and neck  LUNGS: on inspection no signs of respiratory distress, breathing rate appears normal, no obvious gross SOB, gasping or wheezing  CV: no obvious cyanosis  MS: moves all visible extremities without noticeable abnormality  PSYCH/NEURO: pleasant and cooperative, no obvious depression or anxiety, speech and thought processing grossly intact  ASSESSMENT AND PLAN:  Discussed the following assessment and plan:  Type 2 diabetes mellitus with diabetic neuropathy, without long-term current use of insulin (HCC) - Plan: Hemoglobin A1c, Comprehensive metabolic panel, Microalbumin / creatinine urine ratio  Hyperlipidemia associated with type 2 diabetes mellitus ([place]) - Plan: Lipid panel  Sciatica of left side associated with disorder of lumbar spine  Spinal stenosis of cervical region  Radiculopathy, cervical region  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 He is reminded to schedule fasting labs  Lab Results  Component Value Date   CHOL 132 01/09/2019   HDL 33 (L) 01/09/2019   LDLCALC 44 01/09/2019   LDLDIRECT 101.0 10/16/2017   TRIG 277 (H) 01/09/2019   CHOLHDL 4.0 01/09/2019      Radiculopathy, cervical region His pain radiates to left shoulder due to multi level disk disease by August 2020 MRI showing moderate spinal stenosis ct C5-6 level with mild cord flattening and moderate/severe let neural foraminal narrowing . Refill history confirmed via [place] Controlled Substance databas, accessed by me today..he  has not had any ER visits  And has not requested any early refills.  Her Refill history was confirmed via [place] Controlled Substance database by me today during her visit and there have been no prescriptions of controlled substances filled from any providers other than me. .  continue Vicodin for pain management  Sciatica of left side associated with disorder of lumbar spine .  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  Clonazepam trazodone and buspirone are managing concurrent anxiety.  No changes today. encouaraged continued follow up with psych and psychology  Type 2 diabetes mellitus with diabetic neuropathy, unspecified ([place]) Diabetes is well controlled ; tolerating metformi.   he has loss of sensation over several areas of both feet,  And peripheral edema . Warned to not walk barefoot outside  .  Taking ARB  And statin   Lab Results  Component Value Date   CHOL 132 01/09/2019   HDL 33 (L) 01/09/2019   LDLCALC 44 01/09/2019   LDLDIRECT 101.0 10/16/2017   TRIG 277 (H) 01/09/2019   CHOLHDL 4.0 01/09/2019       I discussed the assessment and treatment plan with the patient. The patient was provided an opportunity to ask questions and all were answered. The patient agreed with the plan and demonstrated an understanding of the instructions.   The patient was advised to call back or seek an in-person evaluation if the symptoms worsen or if the condition fails to improve as anticipated.  I provided 220 minutes of non face-to-face time during this  encounter.   [name], MD

## 2020-03-13 NOTE — Assessment & Plan Note (Addendum)
His pain radiates to left shoulder due to multi level disk disease by August 2020 MRI showing moderate spinal stenosis ct C5-6 level with mild cord flattening and moderate/severe let neural foraminal narrowing . Refill history confirmed via [place] Controlled Substance databas, accessed by me today..he has not had any ER visits  And has not requested any early refills.  Her Refill history was confirmed via [place] Controlled Substance database by me today during her visit and there have been no prescriptions of controlled substances filled from any providers other than me. .  continue Vicodin for pain management

## 2020-03-13 NOTE — Assessment & Plan Note (Addendum)
Diabetes is well controlled ; tolerating metformi.   he has loss of sensation over several areas of both feet,  And peripheral edema . Warned to not walk barefoot outside  .  Taking ARB  And statin   Lab Results  Component Value Date   CHOL 132 01/09/2019   HDL 33 (L) 01/09/2019   LDLCALC 44 01/09/2019   LDLDIRECT 101.0 10/16/2017   TRIG 277 (H) 01/09/2019   CHOLHDL 4.0 01/09/2019

## 2020-03-13 NOTE — Assessment & Plan Note (Signed)
His symptoms are controlled on current regimen of Wellbutrin , Abilify.  Clonazepam trazodone and buspirone are managing concurrent anxiety.  No changes today. encouaraged continued follow up with psych and psychology

## 2020-03-13 NOTE — Assessment & Plan Note (Signed)
Managed with rosuvastatin. He is reminded to schedule fasting labs  Lab Results  Component Value Date   CHOL 132 01/09/2019   HDL 33 (L) 01/09/2019   LDLCALC 44 01/09/2019   LDLDIRECT 101.0 10/16/2017   TRIG 277 (H) 01/09/2019   CHOLHDL 4.0 01/09/2019

## 2020-03-25 ENCOUNTER — Other Ambulatory Visit: Payer: Self-pay

## 2020-03-25 ENCOUNTER — Ambulatory Visit (AMBULATORY_SURGERY_CENTER): Payer: Self-pay | Admitting: *Deleted

## 2020-03-25 ENCOUNTER — Other Ambulatory Visit: Payer: Self-pay | Admitting: Internal Medicine

## 2020-03-25 VITALS — Ht 72.0 in | Wt 224.0 lb

## 2020-03-25 DIAGNOSIS — Z1211 Encounter for screening for malignant neoplasm of colon: Secondary | ICD-10-CM

## 2020-03-25 MED ORDER — NA SULFATE-K SULFATE-MG SULF 17.5-3.13-1.6 GM/177ML PO SOLN: ORAL | 0 refills | Status: DC

## 2020-03-25 NOTE — Progress Notes (Signed)
Patient is here in-person for PV. Patient denies any allergies to eggs or soy. Patient denies any problems with anesthesia/sedation. Patient denies any oxygen use at home. Patient denies taking any diet/weight loss medications or blood thinners. Patient is not being treated for MRSA or C-diff. Patient is aware of our care-partner policy and MWUXL-24 safety protocol. EMMI education assigned to the patient for the procedure, sent to [facility].   COVID-19 vaccines completed on 11/2019, per patient.   t.

## 2020-03-29 ENCOUNTER — Other Ambulatory Visit: Payer: Self-pay | Admitting: Internal Medicine

## 2020-03-29 DIAGNOSIS — F411 Generalized anxiety disorder: Secondary | ICD-10-CM

## 2020-03-29 DIAGNOSIS — F331 Major depressive disorder, recurrent, moderate: Secondary | ICD-10-CM

## 2020-04-06 MED ORDER — HYDROCODONE-ACETAMINOPHEN 10-325 MG PO TABS: 1.0000 | ORAL_TABLET | Freq: Two times a day (BID) | ORAL | 0 refills | Status: DC | PRN

## 2020-04-08 ENCOUNTER — Encounter: Payer: Self-pay | Admitting: Gastroenterology

## 2020-04-08 ENCOUNTER — Other Ambulatory Visit: Payer: Self-pay

## 2020-04-08 ENCOUNTER — Ambulatory Visit (AMBULATORY_SURGERY_CENTER): Payer: 59 | Admitting: Gastroenterology

## 2020-04-08 VITALS — BP 119/76 | HR 75 | Temp 97.3°F | Resp 17 | Ht 72.0 in | Wt 224.0 lb

## 2020-04-08 DIAGNOSIS — D122 Benign neoplasm of ascending colon: Secondary | ICD-10-CM

## 2020-04-08 DIAGNOSIS — D120 Benign neoplasm of cecum: Secondary | ICD-10-CM

## 2020-04-08 DIAGNOSIS — Z1211 Encounter for screening for malignant neoplasm of colon: Secondary | ICD-10-CM

## 2020-04-08 DIAGNOSIS — D125 Benign neoplasm of sigmoid colon: Secondary | ICD-10-CM

## 2020-04-08 MED ORDER — SODIUM CHLORIDE 0.9 % IV SOLN: 500.0000 mL | Freq: Once | INTRAVENOUS | Status: DC

## 2020-04-08 NOTE — Op Note (Signed)
[facility] Patient Name: [name] Procedure Date: 04/08/2020 11:19 AM MRN: [id number] Endoscopist: Remo Lipps P. Havery Moros , MD Age: 54 Referring MD:  Date of Birth: [birth year] Gender: Male Account #: [id number] Procedure:                Colonoscopy Indications:              Screening for colorectal malignant neoplasm Medicines:                Monitored Anesthesia Care Procedure:                Pre-Anesthesia Assessment:                           - Prior to the procedure, a History and Physical                            was performed, and patient medications and                            allergies were reviewed. The patient's tolerance of                            previous anesthesia was also reviewed. The risks                            and benefits of the procedure and the sedation                            options and risks were discussed with the patient.                            All questions were answered, and informed consent                            was obtained. Prior Anticoagulants: The patient has                            taken no previous anticoagulant or antiplatelet                            agents. ASA Grade Assessment: III - A patient with                            severe systemic disease. After reviewing the risks                            and benefits, the patient was deemed in                            satisfactory condition to undergo the procedure.                           After obtaining informed consent, the colonoscope  was passed under direct vision. Throughout the                            procedure, the patient's blood pressure, pulse, and                            oxygen saturations were monitored continuously. The                            Olympus [id number]) Colonoscope was                            introduced through the anus and advanced to the the                            cecum,  identified by appendiceal orifice and                            ileocecal valve. The colonoscopy was performed                            without difficulty. The patient tolerated the                            procedure well. The quality of the bowel                            preparation was good. The ileocecal valve,                            appendiceal orifice, and rectum were photographed. Scope In: 11:27:56 AM Scope Out: 11:48:27 AM Scope Withdrawal Time: 0 hours 17 minutes 59 seconds  Total Procedure Duration: 0 hours 20 minutes 31 seconds  Findings:                 The perianal and digital rectal examinations were                            normal.                           Two sessile polyps were found in the cecum. The                            polyps were 3 mm in size. These polyps were removed                            with a cold snare. Resection and retrieval were                            complete.                           Three flat and sessile polyps were found in the  ascending colon. The polyps were 2 to 4 mm in size.                            These polyps were removed with a cold snare.                            Resection and retrieval were complete.                           A 4 mm polyp was found in the sigmoid colon. The                            polyp was sessile. The polyp was removed with a                            cold snare. Resection and retrieval were complete.                           Multiple small-mouthed diverticula were found in                            the sigmoid colon.                           Anal papilla(e) were hypertrophied.                           Internal hemorrhoids were found during retroflexion.                           The exam was otherwise without abnormality. Complications:            No immediate complications. Estimated blood loss:                            Minimal. Estimated Blood Loss:      Estimated blood loss was minimal. Impression:               - Two 3 mm polyps in the cecum, removed with a cold                            snare. Resected and retrieved.                           - Three 2 to 4 mm polyps in the ascending colon,                            removed with a cold snare. Resected and retrieved.                           - One 4 mm polyp in the sigmoid colon, removed with                            a cold snare. Resected and retrieved.                           -  Diverticulosis in the sigmoid colon.                           - Anal papilla(e) were hypertrophied.                           - Internal hemorrhoids.                           - The examination was otherwise normal. Recommendation:           - Patient has a contact number available for                            emergencies. The signs and symptoms of potential                            delayed complications were discussed with the                            patient. Return to normal activities tomorrow.                            Written discharge instructions were provided to the                            patient.                           - Resume previous diet.                           - Continue present medications.                           - Await pathology results. Remo Lipps P. Sheridyn Canino, MD 04/08/2020 11:55:06 AM This report has been signed electronically.

## 2020-04-08 NOTE — Patient Instructions (Addendum)
Handout on polyps and diverticulosis.    YOU HAD AN ENDOSCOPIC PROCEDURE TODAY AT THE [facility]:   Refer to the procedure report that was given to you for any specific questions about what was found during the examination.  If the procedure report does not answer your questions, please call your gastroenterologist to clarify.  If you requested that your care partner not be given the details of your procedure findings, then the procedure report has been included in a sealed envelope for you to review at your convenience later.  YOU SHOULD EXPECT: Some feelings of bloating in the abdomen. Passage of more gas than usual.  Walking can help get rid of the air that was put into your GI tract during the procedure and reduce the bloating. If you had a lower endoscopy (such as a colonoscopy or flexible sigmoidoscopy) you may notice spotting of blood in your stool or on the toilet paper. If you underwent a bowel prep for your procedure, you may not have a normal bowel movement for a few days.  Please Note:  You might notice some irritation and congestion in your nose or some drainage.  This is from the oxygen used during your procedure.  There is no need for concern and it should clear up in a day or so.  SYMPTOMS TO REPORT IMMEDIATELY:  Following lower endoscopy (colonoscopy or flexible sigmoidoscopy):  Excessive amounts of blood in the stool  Significant tenderness or worsening of abdominal pains  Swelling of the abdomen that is new, acute  Fever of 100F or higher   For urgent or emergent issues, a gastroenterologist can be reached at any hour by calling [phone]. Do not use MyChart messaging for urgent concerns.    DIET:  We do recommend a small meal at first, but then you may proceed to your regular diet.  Drink plenty of fluids but you should avoid alcoholic beverages for 24 hours.  ACTIVITY:  You should plan to take it easy for the rest of today and you should NOT DRIVE or  use heavy machinery until tomorrow (because of the sedation medicines used during the test).    FOLLOW UP: Our staff will call the number listed on your records 48-72 hours following your procedure to check on you and address any questions or concerns that you may have regarding the information given to you following your procedure. If we do not reach you, we will leave a message.  We will attempt to reach you two times.  During this call, we will ask if you have developed any symptoms of COVID 19. If you develop any symptoms (ie: fever, flu-like symptoms, shortness of breath, cough etc.) before then, please call [phone].  If you test positive for Covid 19 in the 2 weeks post procedure, please call and report this information to us.    If any biopsies were taken you will be contacted by phone or by letter within the next 1-3 weeks.  Please call us at [phone] if you have not heard about the biopsies in 3 weeks.    SIGNATURES/CONFIDENTIALITY: You and/or your care partner have signed paperwork which will be entered into your electronic medical record.  These signatures attest to the fact that that the information above on your After Visit Summary has been reviewed and is understood.  Full responsibility of the confidentiality of this discharge information lies with you and/or your care-partner.  

## 2020-04-08 NOTE — Progress Notes (Signed)
Called to room to assist during endoscopic procedure.  Patient ID and intended procedure confirmed with present staff. Received instructions for my participation in the procedure from the performing physician.  

## 2020-04-08 NOTE — Progress Notes (Signed)
Pt's states no medical or surgical changes since previsit or office visit.  VS taken by [facility]

## 2020-04-08 NOTE — Progress Notes (Signed)
To PACU, VSS. Report to [email or web address] 

## 2020-04-12 ENCOUNTER — Telehealth: Payer: Self-pay | Admitting: *Deleted

## 2020-04-12 NOTE — Telephone Encounter (Signed)
Left message on f/u call 

## 2020-04-28 ENCOUNTER — Encounter (HOSPITAL_COMMUNITY): Payer: Self-pay | Admitting: Psychiatry

## 2020-04-28 ENCOUNTER — Telehealth (INDEPENDENT_AMBULATORY_CARE_PROVIDER_SITE_OTHER): Payer: 59 | Admitting: Psychiatry

## 2020-04-28 ENCOUNTER — Other Ambulatory Visit: Payer: Self-pay

## 2020-04-28 DIAGNOSIS — F331 Major depressive disorder, recurrent, moderate: Secondary | ICD-10-CM

## 2020-04-28 DIAGNOSIS — F419 Anxiety disorder, unspecified: Secondary | ICD-10-CM | POA: Diagnosis not present

## 2020-04-28 MED ORDER — ARIPIPRAZOLE 5 MG PO TABS: 5.0000 mg | ORAL_TABLET | Freq: Every day | ORAL | 2 refills | Status: DC

## 2020-04-28 MED ORDER — BUPROPION HCL ER (XL) 300 MG PO TB24: 300.0000 mg | ORAL_TABLET | Freq: Every day | ORAL | 2 refills | Status: DC

## 2020-04-28 NOTE — Progress Notes (Signed)
Virtual Visit via Telephone Note  I connected with [name] on 04/28/20 at  2:00 PM EST by telephone and verified that I am speaking with the correct person using two identifiers.  Location: Patient: Home Provider: Home Office   I discussed the limitations, risks, security and privacy concerns of performing an evaluation and management service by telephone and the availability of in person appointments. I also discussed with the patient that there may be a patient responsible charge related to this service. The patient expressed understanding and agreed to proceed.   History of Present Illness: Patient is evaluated by phone session.  He is on the phone by himself.  Lately he noticed more pain in his back and he is scheduled to have an MRI.  Patient told his daughter is pregnant and due in May and sometimes he has to travel however and a half to check on her.  Patient told it has been a difficult when he drives because of the back pain.  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  However lately his wife is somewhat better and able to walk with the help of walker.  Patient is trying to lose weight and he had lost few pounds since the last visit.  He noticed lately not sleeping as good.  When I questioned about his medicine compliance patient told that he is taking trazodone 1 pill which is 100 mg.  His physician has recommended to take trazodone 150 mg but he has not look at the bottle.  He is also getting Klonopin, BuSpar, and gabapentin from other provider.  He is compliant with Abilify and Wellbutrin which she believes helped his depression.  Denies any suicidal thoughts, crying spells or any homicidal thoughts.  He has no tremors, shakes or any EPS.  Denies any panic attack.  Past Psychiatric History:Reviewed. H/Odepression and anxiety. No h/osuicidal attempt or inpatient treatment. Seen Dr. Edwin Dada and given Zoloft, Klonopin and Cymbalta. Later PCP added  BuSpar and trazodone.We tried Lamictal but did not help.   Psychiatric Specialty Exam: Physical Exam  Review of Systems  Weight 224 lb (101.6 kg).Body mass index is 30.38 kg/m.  General Appearance: NA  Eye Contact:  NA  Speech:  Clear and Coherent and Slow  Volume:  Normal  Mood:  Dysphoric  Affect:  NA  Thought Process:  Goal Directed  Orientation:  Full (Time, Place, and Person)  Thought Content:  Rumination  Suicidal Thoughts:  No  Homicidal Thoughts:  No  Memory:  Immediate;   Good Recent;   Fair Remote;   Fair  Judgement:  Intact  Insight:  Present  Psychomotor Activity:  NA  Concentration:  Concentration: Fair and Attention Span: Fair  Recall:  Good  Fund of Knowledge:  Good  Language:  Good  Akathisia:  No  Handed:  Right  AIMS (if indicated):     Assets:  Communication Skills Desire for Improvement Housing Resilience  ADL's:  Intact  Cognition:  WNL  Sleep:   fair      Assessment and Plan: Major depressive disorder, recurrent.  Anxiety.  Recommended to take the trazodone 150 mg as prescribed by his PCP.  Currently he is taking only 100 mg.  We discussed polypharmacy.  He is getting gabapentin, Klonopin and BuSpar from his PCP.  He does not like to cut down the medication because he feel he needed this medicine which is keeping him is stable for depression and anxiety.  He like to keep the current dose of Abilify and Wellbutrin.  He has no tremors.  Continue Abilify 5 mg daily and Wellbutrin XL 300 mg daily.  Encourage therapy with Charolotte Eke.  Recommended to call us back with any question or any concern.  Follow-up in 3 months.  Follow Up Instructions:    I discussed the assessment and treatment plan with the patient. The patient was provided an opportunity to ask questions and all were answered. The patient agreed with the plan and demonstrated an understanding of the instructions.   The patient was advised to call back or seek an in-person evaluation  if the symptoms worsen or if the condition fails to improve as anticipated.  I provided 12 minutes of non-face-to-face time during this encounter.   Kathlee Nations, MD

## 2020-05-02 ENCOUNTER — Other Ambulatory Visit: Payer: Self-pay | Admitting: Internal Medicine

## 2020-05-02 DIAGNOSIS — F331 Major depressive disorder, recurrent, moderate: Secondary | ICD-10-CM

## 2020-05-02 DIAGNOSIS — F411 Generalized anxiety disorder: Secondary | ICD-10-CM

## 2020-05-10 ENCOUNTER — Ambulatory Visit: Payer: 59 | Admitting: Orthopedic Surgery

## 2020-05-10 ENCOUNTER — Other Ambulatory Visit: Payer: Self-pay | Admitting: Internal Medicine

## 2020-05-11 MED ORDER — HYDROCODONE-ACETAMINOPHEN 10-325 MG PO TABS: 1.0000 | ORAL_TABLET | Freq: Two times a day (BID) | ORAL | 0 refills | Status: DC | PRN

## 2020-05-17 ENCOUNTER — Ambulatory Visit (INDEPENDENT_AMBULATORY_CARE_PROVIDER_SITE_OTHER): Payer: 59 | Admitting: Orthopedic Surgery

## 2020-05-17 DIAGNOSIS — M79605 Pain in left leg: Secondary | ICD-10-CM

## 2020-05-17 DIAGNOSIS — M1712 Unilateral primary osteoarthritis, left knee: Secondary | ICD-10-CM

## 2020-05-17 DIAGNOSIS — M5410 Radiculopathy, site unspecified: Secondary | ICD-10-CM | POA: Diagnosis not present

## 2020-05-22 ENCOUNTER — Encounter: Payer: Self-pay | Admitting: Orthopedic Surgery

## 2020-05-22 NOTE — Progress Notes (Unsigned)
Office Visit Note   Patient: [name]           Date of Birth: [birth year]           MRN: [id number] Visit Date: 05/17/2020 Requested by: [name], MD [address],  [place] [address] PCP: [name], MD  Subjective: Chief Complaint  Patient presents with  . Left Knee - Pain    HPI: [name] is a 55 y.o. male who presents to the office complaining of left knee pain.  Patient has continued left knee pain.  He is ambulating with a cane.  He complains of decrease in balance and complains of falling a few times since his last visit.  Localizes pain to the medial/lateral/anterior left knee.  He also complains of radicular pain down the left leg with numbness and tingling and low back pain.  He wakes with pain about 2-3 times per week from the knee.  He feels his left leg is weak at times.  His last knee injection lasted 2 months and gave him about 80% relief initially from his knee pain.  He has 15-minute walking endurance.  Takes hydrocodone twice daily that he receives from his primary care provider.  He has history of right total knee arthroplasty in 2016.  He did well following his right knee replacement.  MRI of the left knee from February 2021 shows small fissure in patellofemoral cartilage, medial compartment diffusely thinned without focal defect, lateral compartment with mild degenerative changes.  Overall he feels his knee is bothering him more than the radicular/back pain that he has.  He also has history of MRI of his lumbar spine in 2020 that showed an L3-L4 foraminal protrusion.  He has no history of ESI's..                ROS: All systems reviewed are negative as they relate to the chief complaint within the history of present illness.  Patient denies fevers or chills.  Assessment & Plan: Visit Diagnoses:  1. Radicular syndrome of left leg   2. Pain in left leg   3. Arthritis of left knee     Plan: Patient is a 55 year old male who  presents complaint of left knee pain.  He has history of left knee pain that he has received injections for.  Last injection lasted 2 months and provided 80% relief initially.  However his MRI from February 2021 is not significantly impressive regarding the amount of degeneration in the left knee.  He also has radicular pain with an MRI from 2020 of his lumbar spine showing an L3-L4 foraminal protrusion.  Differential diagnosis includes left knee pain from the degenerative changes in his knee versus referred pain from the lumbar spine.  He states that he would like to proceed with left knee replacement as he does not like having to come in every 3 to 4 months for knee injections.  However, would like to try L3-L4 foraminal injection in the lumbar spine by Dr. Ernestina Patches for diagnostic and therapeutic purposes.  Follow-up after injection to see how much relief it gives him.  Can decide for or against knee replacement based on his response to that injection.  Follow-Up Instructions: No follow-ups on file.   Orders:  Orders Placed This Encounter  Procedures  . Ambulatory referral to Physical Medicine Rehab   No orders of the defined types were placed in this encounter.     Procedures: No procedures  performed   Clinical Data: No additional findings.  Objective: Vital Signs: There were no vitals taken for this visit.  Physical Exam:  Constitutional: Patient appears well-developed HEENT:  Head: Normocephalic Eyes:EOM are normal Neck: Normal range of motion Cardiovascular: Normal rate Pulmonary/chest: Effort normal Neurologic: Patient is alert Skin: Skin is warm Psychiatric: Patient has normal mood and affect  Ortho Exam: Ortho exam demonstrates left knee with no effusion.  Tenderness over the medial joint line primarily but also tenderness over the lateral joint line.  Mild pain with side to side motion of the patella.  Able to perform straight leg raise.  No calf tenderness.  Negative  Homans' sign.  No pain with hip range of motion.  No significant weakness of the lower extremity muscle groups with 5/5 motor strength of bilateral hip flexor, quadricep, hamstring, dorsiflexion, plantarflexion.  Specialty Comments:  No specialty comments available.  Imaging: No results found.   PMFS History: Patient Active Problem List   Diagnosis Date Noted  . Colon cancer screening 02/02/2020  . Eczema of both external ears 12/08/2019  . Edema of right lower extremity 12/08/2019  . Polyarthritis of multiple sites 10/27/2019  . Coronary artery calcification seen on CT scan 10/27/2019  . 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 Radiculopathy, cervical region 06/10/2019  . Carpal tunnel syndrome, left upper limb 06/10/2019  . 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 Spinal stenosis of cervical region 12/31/2018  . Loose body of left knee   . Acute medial meniscus tear of left knee   . Plantar fasciitis of left foot 08/24/2017  . Chronic pain of both hips 06/20/2017  . Angioedema of lips 11/26/2016  . 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 Hypothyroidism due to acquired atrophy of thyroid 09/14/2015  . Arthritis of knee 06/23/2014  . Encounter for preventive health examination 06/17/2014  . Enlarged RV (right ventricle) 06/17/2014  . GERD (gastroesophageal reflux disease) 04/20/2014  . Cervical spine degeneration 02/17/2014  . Overweight 10/29/2013  . Chronic knee pain 10/29/2013  . Type 2 diabetes mellitus with diabetic neuropathy, unspecified ([place]) 10/23/2012  . Seborrheic dermatitis, unspecified 05/17/2012  . Fatty liver 04/22/2012  . Hypogonadism male 03/12/2012  . Sciatica of left side associated with disorder of lumbar spine 12/24/2010  . Hyperlipidemia associated with type 2 diabetes mellitus  ([place]) 01/13/2009  . Anxiety state 01/13/2009  . Essential hypertension 01/13/2009  . OSA (obstructive sleep apnea) 01/13/2009   Past Medical History:  Diagnosis Date  . Alcohol-induced chronic pancreatitis ([place]) 01/09/2019  . Anxiety   . Anxiety and depression   . Arthritis    knees and shoulders  . Benzodiazepine withdrawal with complication ([place]) 7/82/9562  . Bruit    L  . Chest pain    hx  . Cognitive complaints with normal neuropsychological exam 10/23/2016  . Diabetes mellitus without complication ([place])    "borderline", diet controlled, no meds, patient has lost 30 lbs  . Edema   . Fatty liver   . GERD (gastroesophageal reflux disease)    uses Omeprazole  . Goiter   . HLD (hyperlipidemia)   . Hypertension    essential, benign  . Hypothyroidism   . Impotence of organic origin   . Murmur    never has caused any problems  . Neuromuscular disorder ([place])   . Other chest pain    tightness, pressure  .  Palpitation    hx  . Precordial pain   . Sleep apnea    uses cpap    Family History  Problem Relation Age of Onset  . Heart disease Father   . Colon cancer Neg Hx   . Colon polyps Neg Hx   . Esophageal cancer Neg Hx   . Rectal cancer Neg Hx   . Stomach cancer Neg Hx     Past Surgical History:  Procedure Laterality Date  . APPENDECTOMY  2002   done at [facility]  . CARDIAC CATHETERIZATION    . CARDIAC CATHETERIZATION N/A 08/27/2015   Procedure: Left Heart Cath and Coronary Angiography;  Surgeon: Jolaine Artist, MD;  Location: [facility];  Service: Cardiovascular;  Laterality: N/A;  . COLONOSCOPY  in his 20's  . EAR CYST EXCISION Left 07/08/2019   Procedure: open excision baker's cyst left knee;  Surgeon: Meredith Pel, MD;  Location: [facility];  Service: Orthopedics;  Laterality: Left;  . HERNIA REPAIR  [id number]   umbilical  . JOINT REPLACEMENT     right knee  . KNEE ARTHROSCOPY Left 12/17/2018   Procedure: left knee arthroscopy, meniscal debridement, loose  body removal;  Surgeon: Meredith Pel, MD;  Location: [facility];  Service: Orthopedics;  Laterality: Left;  . REVISION TOTAL KNEE ARTHROPLASTY Right 06/23/2014   DR Marlou Sa  . TONSILLECTOMY    . TOTAL KNEE ARTHROPLASTY Right 2011   right  . TOTAL KNEE REVISION Right 06/23/2014   Procedure: TOTAL KNEE REVISION;  Surgeon: Meredith Pel, MD;  Location: [facility];  Service: Orthopedics;  Laterality: Right;  . UPPER GASTROINTESTINAL ENDOSCOPY  04/30/2013   Sharlett Iles   . WISDOM TOOTH EXTRACTION     Social History   Occupational History  . Occupation: IT trainer  . Occupation: SERVICE DIRECTOR    Employer: [employer]  Tobacco Use  . Smoking status: Former Smoker    Packs/day: 2.00    Years: 18.00    Pack years: 36.00    Types: Cigarettes    Quit date: 04/24/2005    Years since quitting: 15.0  . Smokeless tobacco: Never Used  . Tobacco comment: quit 20 years ago   Vaping Use  . Vaping Use: Never used  Substance and Sexual Activity  . Alcohol use: Yes    Alcohol/week: 4.0 standard drinks    Types: 4 Cans of beer per week  . Drug use: No  . Sexual activity: Yes

## 2020-05-28 ENCOUNTER — Other Ambulatory Visit: Payer: Self-pay

## 2020-05-28 ENCOUNTER — Encounter: Payer: Self-pay | Admitting: Internal Medicine

## 2020-05-28 ENCOUNTER — Ambulatory Visit (INDEPENDENT_AMBULATORY_CARE_PROVIDER_SITE_OTHER): Payer: 59 | Admitting: Internal Medicine

## 2020-05-28 VITALS — BP 126/78 | HR 77 | Temp 97.9°F | Ht 72.01 in | Wt 220.8 lb

## 2020-05-28 DIAGNOSIS — M2550 Pain in unspecified joint: Secondary | ICD-10-CM

## 2020-05-28 DIAGNOSIS — G8929 Other chronic pain: Secondary | ICD-10-CM

## 2020-05-28 DIAGNOSIS — I2510 Atherosclerotic heart disease of native coronary artery without angina pectoris: Secondary | ICD-10-CM

## 2020-05-28 DIAGNOSIS — F411 Generalized anxiety disorder: Secondary | ICD-10-CM

## 2020-05-28 DIAGNOSIS — Z23 Encounter for immunization: Secondary | ICD-10-CM

## 2020-05-28 DIAGNOSIS — E1140 Type 2 diabetes mellitus with diabetic neuropathy, unspecified: Secondary | ICD-10-CM

## 2020-05-28 DIAGNOSIS — E785 Hyperlipidemia, unspecified: Secondary | ICD-10-CM

## 2020-05-28 DIAGNOSIS — F0632 Mood disorder due to known physiological condition with major depressive-like episode: Secondary | ICD-10-CM

## 2020-05-28 DIAGNOSIS — E1121 Type 2 diabetes mellitus with diabetic nephropathy: Secondary | ICD-10-CM | POA: Diagnosis not present

## 2020-05-28 DIAGNOSIS — E1169 Type 2 diabetes mellitus with other specified complication: Secondary | ICD-10-CM

## 2020-05-28 DIAGNOSIS — E034 Atrophy of thyroid (acquired): Secondary | ICD-10-CM

## 2020-05-28 DIAGNOSIS — F331 Major depressive disorder, recurrent, moderate: Secondary | ICD-10-CM

## 2020-05-28 DIAGNOSIS — M4802 Spinal stenosis, cervical region: Secondary | ICD-10-CM

## 2020-05-28 LAB — COMPREHENSIVE METABOLIC PANEL
ALT: 21 U/L (ref 0–53)
AST: 17 U/L (ref 0–37)
Albumin: 4.4 g/dL (ref 3.5–5.2)
Alkaline Phosphatase: 126 U/L — ABNORMAL HIGH (ref 39–117)
BUN: 14 mg/dL (ref 6–23)
CO2: 32 mEq/L (ref 19–32)
Calcium: 9.7 mg/dL (ref 8.4–10.5)
Chloride: 102 mEq/L (ref 96–112)
Creatinine, Ser: 0.78 mg/dL (ref 0.40–1.50)
GFR: 100.36 mL/min (ref >60.00)
Glucose, Bld: 94 mg/dL (ref 70–99)
Potassium: 4 mEq/L (ref 3.5–5.1)
Sodium: 140 mEq/L (ref 135–145)
Total Bilirubin: 0.6 mg/dL (ref 0.2–1.2)
Total Protein: 6.9 g/dL (ref 6.0–8.3)

## 2020-05-28 LAB — LIPID PANEL
Cholesterol: 148 mg/dL (ref 0–200)
HDL: 31.2 mg/dL — ABNORMAL LOW (ref >39.00)
LDL Cholesterol: 84 mg/dL (ref 0–99)
NonHDL: 116.75
Total CHOL/HDL Ratio: 5
Triglycerides: 165 mg/dL — ABNORMAL HIGH (ref 0.0–149.0)
VLDL: 33 mg/dL (ref 0.0–40.0)

## 2020-05-28 LAB — MICROALBUMIN / CREATININE URINE RATIO
Creatinine,U: 68.8 mg/dL
Microalb Creat Ratio: 1 mg/g (ref 0.0–30.0)
Microalb, Ur: <0.7 mg/dL (ref 0.0–1.9)

## 2020-05-28 LAB — TSH: TSH: 2.41 u[IU]/mL (ref 0.35–4.50)

## 2020-05-28 LAB — HEMOGLOBIN A1C: Hgb A1c MFr Bld: 6.4 % (ref 4.6–6.5)

## 2020-05-28 MED ORDER — HYDROCODONE-ACETAMINOPHEN 10-325 MG PO TABS
1.0000 | ORAL_TABLET | Freq: Two times a day (BID) | ORAL | 0 refills | Status: DC | PRN
Start: 2020-06-10 — End: 2020-09-02

## 2020-05-28 NOTE — Patient Instructions (Signed)
I have refilled your hydrocodone for 3 more months (Feb 17, March 19  And April 18)  I will refill your thyroid medication once I see your level from today's labs    RTC 3 months

## 2020-05-28 NOTE — Progress Notes (Signed)
Subjective:  Patient ID: [name], male    DOB: 10-Apr-1966  Age: 55 y.o. MRN: [id number]  CC: The primary encounter diagnosis was Need for immunization against influenza. 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  HPI [name] presents for follow up on multiple issues, including type 2 DM and chronic pain    This visit occurred during the SARS-CoV-2 public health emergency.  Safety protocols were in place, including screening questions prior to the visit, additional usage of staff PPE, and extensive cleaning of exam room while observing appropriate contact time as indicated for disinfecting solutions.    1) dry mouth,  Occurs every morning.  He has OSA and wears a full face mask for CPAP, and is using the water in the reservoir. Average water intake is 4 to 5 bottles plus coffee  2 cups     2) he notes increased Muscle stiffness in the mornings only. Improves with time and movement. He has  chronic paine to DJD. polyarthralgias and degenerative disk disease (cervical, lumbar)  managed with hydrocodone taken every 12 hours, celebrex 100 mg bid,  And gabapentin for sciatica.  3) T2DM;   Checking BS occasionally  Notes that his highest fasting readings have been at or less than 120 .   Highest post prandial was 172 .  No lows.   Has noticed some changes in vision,  He is  using OTC readers and has had  to increase the strength of them..  No issues with night driving.  Last eye exam over 2 years ago. Referral requested for diabetic eye exam/   4)Hypothyroidism,  He has been  taking 88 mcg levothyrxine   NOT 100 MCG   5) Hypertension: patient checks blood pressure twice weekly at home.  Readings have been for the most part  < 140/80 at rest . Patient is following a reduce salt diet most days and is taking medications as prescribed (amlodipine 5 mg,  Losartan 100 mg and metoprolol 25 mg )   Outpatient Medications Prior to Visit  Medication Sig Dispense Refill  . amLODipine (NORVASC) 5 MG tablet Take 1 tablet (5 mg total) by mouth daily. 90 tablet 3  . ARIPiprazole (ABILIFY) 5 MG tablet Take 1 tablet (5 mg total) by mouth at bedtime. 30 tablet 2  . atorvastatin (LIPITOR) 20 MG tablet Take 1 tablet (20 mg total) by mouth daily. 90 tablet 3  . blood glucose meter kit and supplies Dispense based on patient and insurance preference. Use to check blood sugars once daily. 1 each 0  . buPROPion (WELLBUTRIN XL) 300 MG 24 hr tablet Take 1 tablet (300 mg total) by mouth daily. 30 tablet 2  . busPIRone (BUSPAR) 15 MG tablet TAKE 1 TABLET BY MOUTH THREE TIMES DAILY 90 tablet 0  . clobetasol (TEMOVATE) 0.05 % external solution Apply 1 application topically 2 (two) times daily as needed (scalp irritation).    . Clobetasol Propionate 0.05 % shampoo Apply to dry scalp and leave on for 15 minutes (Patient  taking differently: Apply 1 application topically 2 (two) times a week. Apply to dry scalp and leave on for 15 minutes as needed for itching.) 118 mL 5  . clonazePAM (KLONOPIN) 1 MG tablet TAKE 1 TABLET BY MOUTH TWICE DAILY AS NEEDED FOR ANXIETY 60 tablet 5  . dicyclomine (BENTYL) 20 MG tablet TAKE 1 TABLET BY MOUTH EVERY 6 HOURS 120 tablet 0  . EUTHYROX 88 MCG tablet TAKE 1 TABLET BY MOUTH ONCE DAILY BEFORE BREAKFAST 90 tablet 0  . gabapentin (NEURONTIN) 600 MG tablet Take 1 tablet (600 mg total) by mouth 3  (three) times daily. 270 tablet 0  . glucose blood test strip Use once daily to check BS at various times  E11.9 100 each 12  . losartan (COZAAR) 100 MG tablet Take 1 tablet (100 mg total) by mouth daily. 90 tablet 3  . metFORMIN (GLUCOPHAGE) 500 MG tablet Take 1 tablet (500 mg total) by mouth 2 (two) times daily with a meal. 180 tablet 3  . methocarbamol (ROBAXIN) 500 MG tablet Take 1 tablet (500 mg total) by mouth every 8 (eight) hours as needed. 90 tablet 11  . metoprolol tartrate (LOPRESSOR) 25 MG tablet Take 1 tablet by mouth twice daily 180 tablet 0  . mometasone (ELOCON) 0.1 % cream Apply 1 application topically daily. To ear canal as needed for itching 45 g 0  . Multiple Vitamin (MULTIVITAMIN WITH MINERALS) TABS tablet Take 1 tablet by mouth daily.    Marland Kitchen omeprazole (PRILOSEC) 40 MG capsule Take 1 capsule (40 mg total) by mouth in the morning and at bedtime. 180 capsule 1  . promethazine (PHENERGAN) 12.5 MG tablet TAKE 1 TABLET BY MOUTH EVERY 8 HOURS AS NEEDED FOR NAUSEA AND  VOMITING 20 tablet 0  . SUPER B COMPLEX/C PO Take 1 tablet by mouth daily.    . traZODone (DESYREL) 100 MG tablet Take 1.5 tablets (150 mg total) by mouth at bedtime. 135 tablet 2  . celecoxib (CELEBREX) 100 MG capsule TAKE 1 CAPSULE BY MOUTH TWICE DAILY FOR 10 DAYS THEN 1 ONCE DAILY AS NEEDED 30 capsule 0  . HYDROcodone-acetaminophen (NORCO) 10-325 MG tablet Take 1 tablet by mouth 2 (two) times daily as needed. 60 tablet 0  . levothyroxine (EUTHYROX) 100 MCG tablet TAKE 1 TABLET BY MOUTH ONCE DAILY BEFORE BREAKFAST 90 tablet 1   No facility-administered medications prior to visit.    Review of Systems;  Patient denies headache, fevers, malaise, unintentional weight loss, skin rash, eye pain, sinus congestion and sinus pain, sore throat, dysphagia,  hemoptysis , cough, dyspnea, wheezing, chest pain, palpitations, orthopnea, edema, abdominal pain, nausea, melena, diarrhea, constipation, flank pain, dysuria, hematuria,  urinary  Frequency, nocturia, numbness, tingling, seizures,  Focal weakness, Loss of consciousness,  Tremor, insomnia, depression, anxiety, and suicidal ideation.      Objective:  BP 126/78 (BP Location: Left Arm, Patient Position: Sitting)   Pulse 77   Temp 97.9 F (36.6 C)   Ht 6' 0.01" (1.829 m)   Wt 220 lb 12.8 oz (100.2 kg)   SpO2 97%   BMI 29.94 kg/m   BP Readings from Last 3 Encounters:  05/28/20 126/78  04/08/20 119/76  01/28/20 128/77    Wt Readings from Last 3 Encounters:  05/28/20 220 lb 12.8 oz (100.2 kg)  04/28/20 224 lb (101.6 kg)  04/08/20 224 lb (101.6 kg)    General appearance: alert, cooperative and appears stated age Ears: normal TM's and external ear canals both ears Throat: lips,  mucosa, and tongue normal; teeth and gums normal Neck: no adenopathy, no carotid bruit, supple, symmetrical, trachea midline and thyroid not enlarged, symmetric, no tenderness/mass/nodules Back: symmetric, no curvature. ROM normal. No CVA tenderness. Lungs: clear to auscultation bilaterally Heart: regular rate and rhythm, S1, S2 normal, no murmur, click, rub or gallop Abdomen: soft, non-tender; bowel sounds normal; no masses,  no organomegaly Pulses: 2+ and symmetric Skin: Skin color, texture, turgor normal. No rashes or lesions Lymph nodes: Cervical, supraclavicular, and axillary nodes normal.  Lab Results  Component Value Date   HGBA1C 6.4 05/28/2020   HGBA1C 6.6 (H) 10/24/2019   HGBA1C 6.7 (H) 07/04/2019    Lab Results  Component Value Date   CREATININE 0.78 05/28/2020   CREATININE 0.92 10/24/2019   CREATININE 1.03 07/04/2019    Lab Results  Component Value Date   WBC 5.8 10/24/2019   HGB 15.2 10/24/2019   HCT 44.9 10/24/2019   PLT 243 10/24/2019   GLUCOSE 94 05/28/2020   CHOL 148 05/28/2020   TRIG 165.0 (H) 05/28/2020   HDL 31.20 (L) 05/28/2020   LDLDIRECT 101.0 10/16/2017   LDLCALC 84 05/28/2020   ALT 21 05/28/2020   AST 17 05/28/2020   NA 140  05/28/2020   K 4.0 05/28/2020   CL 102 05/28/2020   CREATININE 0.78 05/28/2020   BUN 14 05/28/2020   CO2 32 05/28/2020   TSH 2.41 05/28/2020   PSA 0.70 10/16/2017   INR 0.98 08/27/2015   HGBA1C 6.4 05/28/2020   MICROALBUR <0.7 05/28/2020    No results found.  Assessment & Plan:   Problem List Items Addressed This Visit      Unprioritized   Anxiety state    Aggravated by his wife [name]'s health problems, his loss of employment and the consequent financial hardship. His anxiety is managed with scheduled clonazepam, buspironeAnd trazodone 150 mg qhs.  His has recently been awarded disability , which has alleviated the financial strain. Will encourage him to consider actively trying to reduce his dependance on  clonazepam      Chronic pain of multiple joints    He has  chronic pain and documented objective evidence of DJD and ligament injuries  involving shoulders and knees.  He has a history of overuse of NSAIDS without relief.  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   he has loss of sensation over several areas of both feet,  But his feet are in excellent condition.   .  Taking ARB  And statin   Lab Results  Component Value Date   CHOL 148 05/28/2020   HDL 31.20 (L) 05/28/2020   LDLCALC 84 05/28/2020   LDLDIRECT 101.0 10/16/2017   TRIG 165.0 (H) 05/28/2020   CHOLHDL 5 05/28/2020         Relevant Medications   aspirin EC  81 MG tablet   Other Relevant Orders   Ambulatory referral to Ophthalmology   Coronary artery calcification seen on CT scan  He is tolerating atorvastatin for primary prevention  LDL is now 84,   And transaminases are WNL.  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  Clonazepam trazodone and buspirone are managing concurrent anxiety.  No changes today. encouaraged continued follow up with psych and psychology      Hyperlipidemia associated with type 2 diabetes mellitus ([place])    Managed with atorvastatin 20 mg daily. LDL is < 100 Lab Results  Component Value Date   CHOL 148 05/28/2020   HDL 31.20 (L) 05/28/2020   LDLCALC 84 05/28/2020   LDLDIRECT 101.0 10/16/2017   TRIG 165.0 (H) 05/28/2020   CHOLHDL 5 05/28/2020         Relevant Medications   aspirin EC 81 MG tablet   Hypothyroidism due to acquired atrophy of thyroid    Thyroid function is WNL on current dose of 88 mcg daily   No current changes needed.   Lab Results  Component Value Date   TSH 2.41 05/28/2020         MDD (major depressive disorder), recurrent episode, moderate ([facility])    His symptoms are controlled on current regimen of Wellbutrin , Abilify.  Clonazepam trazodone and buspirone are managing concurrent anxiety.  No changes today. encouaraged continued follow up with psych and psychology      Spinal stenosis of cervical region    His pain radiates to left shoulder  due to multi level disk disease by August 2020 MRI showing moderate spinal stenosis ct C5-6 level with mild cord flattening and moderate/severe let neural foraminal narrowing . Refill history confirmed via [place] Controlled Substance databas, accessed by me today..he has not had any ER visits  And has not requested any early refills.  Refill history was confirmed via [place] Controlled Substance database by me today during  visit and there have been no prescriptions of controlled substances filled from any providers other than me. .  continue Vicodin for pain management      Type 2 diabetes mellitus with diabetic neuropathy, unspecified ([facility])   Relevant Medications   aspirin EC 81 MG tablet    Other Visit Diagnoses    Need for immunization against influenza    -  Primary   Relevant Orders   Flu Vaccine QUAD 36+ mos IM (Completed)      I spent 30 minutes dedicated to the care of this patient on the date of this encounter to include pre-visit review of his medical history,  Face-to-face time with the patient , and post visit ordering of testing and therapeutics.  I have discontinued Gillian Shields. Madry "Neil"'s levothyroxine. I have also changed his celecoxib. Additionally, I am having him start on aspirin EC. Lastly, I am having him maintain his multivitamin with minerals, Clobetasol Propionate, SUPER B COMPLEX/C PO, clobetasol, traZODone, methocarbamol, omeprazole, metFORMIN, gabapentin, losartan, amLODipine, glucose blood, mometasone,  atorvastatin, blood glucose meter kit and supplies, promethazine, clonazePAM, buPROPion, ARIPiprazole, dicyclomine, busPIRone, Euthyrox, metoprolol tartrate, HYDROcodone-acetaminophen, HYDROcodone-acetaminophen, and HYDROcodone-acetaminophen.  Meds ordered this encounter  Medications  . HYDROcodone-acetaminophen (NORCO) 10-325 MG tablet    Sig: Take 1 tablet by mouth 2 (two) times daily as needed.    Dispense:  60 tablet    Refill:  0  . celecoxib (CELEBREX) 200 MG capsule     Sig: Take 1 capsule (200 mg total) by mouth daily.    Dispense:  90 capsule    Refill:  1  . HYDROcodone-acetaminophen (NORCO) 10-325 MG tablet    Sig: Take 1 tablet by mouth 2 (two) times daily as needed.    Dispense:  60 tablet    Refill:  0  . HYDROcodone-acetaminophen (NORCO) 10-325 MG tablet    Sig: Take 1 tablet by mouth 2 (two) times daily as needed.    Dispense:  60 tablet    Refill:  0  . aspirin EC 81 MG tablet    Sig: Take 1 tablet (81 mg total) by mouth daily. Swallow whole.    Dispense:  30 tablet    Refill:  11    Medications Discontinued During This Encounter  Medication Reason  . levothyroxine (EUTHYROX) 100 MCG tablet   . HYDROcodone-acetaminophen (NORCO) 10-325 MG tablet Reorder  . celecoxib (CELEBREX) 100 MG capsule Reorder    Follow-up: Return in about 3 months (around 08/25/2020).   [name], MD

## 2020-05-29 ENCOUNTER — Encounter: Payer: Self-pay | Admitting: Internal Medicine

## 2020-05-29 DIAGNOSIS — F331 Major depressive disorder, recurrent, moderate: Secondary | ICD-10-CM | POA: Insufficient documentation

## 2020-05-29 MED ORDER — CELECOXIB 200 MG PO CAPS: 200.0000 mg | ORAL_CAPSULE | Freq: Every day | ORAL | 1 refills | Status: DC

## 2020-05-29 MED ORDER — HYDROCODONE-ACETAMINOPHEN 10-325 MG PO TABS: 1.0000 | ORAL_TABLET | Freq: Two times a day (BID) | ORAL | 0 refills | Status: DC | PRN

## 2020-05-29 MED ORDER — ASPIRIN EC 81 MG PO TBEC: 81.0000 mg | DELAYED_RELEASE_TABLET | Freq: Every day | ORAL | 11 refills | Status: DC

## 2020-05-29 NOTE — Assessment & Plan Note (Signed)
He has  chronic pain and documented objective evidence of DJD and ligament injuries  involving shoulders and knees.  He has a history of overuse of NSAIDS without relief.  I have agreed to continue  Prescribing hydrocodone for use not more than twice daily and continue celebrex once daily along with a daily PPI   

## 2020-05-29 NOTE — Progress Notes (Signed)
Your thyroid function is normal  on your current levothyroxine  88 mcg daily  dose.  Please continue your current dose and we will repeat in 6 months .  Your diabetes and cholesterol are at goal as well.  Continue metformin and atorvastatin  I have refilled the celebrex to use once daily as your anti inflammatory,  and the Vicodin has been refilled  for 3 months.  Regards,   Deborra Medina, MD

## 2020-05-29 NOTE — Assessment & Plan Note (Addendum)
Managed with atorvastatin 20 mg daily. LDL is < 100 Lab Results  Component Value Date   CHOL 148 05/28/2020   HDL 31.20 (L) 05/28/2020   LDLCALC 84 05/28/2020   LDLDIRECT 101.0 10/16/2017   TRIG 165.0 (H) 05/28/2020   CHOLHDL 5 05/28/2020

## 2020-05-29 NOTE — Assessment & Plan Note (Signed)
Thyroid function is WNL on current dose of 88 mcg daily   No current changes needed.   Lab Results  Component Value Date   TSH 2.41 05/28/2020

## 2020-05-29 NOTE — Assessment & Plan Note (Addendum)
He is tolerating atorvastatin for primary prevention  LDL is now 84,   And transaminases are WNL.  Advised to add baby aspirin   Lab Results  Component Value Date   CHOL 148 05/28/2020   HDL 31.20 (L) 05/28/2020   LDLCALC 84 05/28/2020   LDLDIRECT 101.0 10/16/2017   TRIG 165.0 (H) 05/28/2020   CHOLHDL 5 05/28/2020   Lab Results  Component Value Date   ALT 21 05/28/2020   AST 17 05/28/2020   ALKPHOS 126 (H) 05/28/2020   BILITOT 0.6 05/28/2020

## 2020-05-29 NOTE — Assessment & Plan Note (Signed)
Aggravated by his wife Tanya's health problems, his loss of employment and the consequent financial hardship. His anxiety is managed with scheduled clonazepam, buspironeAnd trazodone 150 mg qhs.  His has recently been awarded disability , which has alleviated the financial strain. Will encourage him to consider actively trying to reduce his dependance on  clonazepam

## 2020-05-29 NOTE — Assessment & Plan Note (Signed)
His pain radiates to left shoulder due to multi level disk disease by August 2020 MRI showing moderate spinal stenosis ct C5-6 level with mild cord flattening and moderate/severe let neural foraminal narrowing . Refill history confirmed via [place] Controlled Substance databas, accessed by me today..he has not had any ER visits  And has not requested any early refills.  Refill history was confirmed via [place] Controlled Substance database by me today during  visit and there have been no prescriptions of controlled substances filled from any providers other than me. .  continue Vicodin for pain management

## 2020-05-29 NOTE — Assessment & Plan Note (Signed)
Diabetes is well controlled ; tolerating metformin.   he has loss of sensation over several areas of both feet,  But his feet are in excellent condition.   .  Taking ARB  And statin   Lab Results  Component Value Date   CHOL 148 05/28/2020   HDL 31.20 (L) 05/28/2020   LDLCALC 84 05/28/2020   LDLDIRECT 101.0 10/16/2017   TRIG 165.0 (H) 05/28/2020   CHOLHDL 5 05/28/2020

## 2020-05-29 NOTE — Assessment & Plan Note (Signed)
His symptoms are controlled on current regimen of Wellbutrin , Abilify.  Clonazepam trazodone and buspirone are managing concurrent anxiety.  No changes today. encouaraged continued follow up with psych and psychology 

## 2020-06-14 ENCOUNTER — Other Ambulatory Visit: Payer: Self-pay | Admitting: Internal Medicine

## 2020-06-14 DIAGNOSIS — F331 Major depressive disorder, recurrent, moderate: Secondary | ICD-10-CM

## 2020-06-14 DIAGNOSIS — F411 Generalized anxiety disorder: Secondary | ICD-10-CM

## 2020-06-21 ENCOUNTER — Encounter: Payer: Self-pay | Admitting: Physical Medicine and Rehabilitation

## 2020-06-21 ENCOUNTER — Ambulatory Visit (INDEPENDENT_AMBULATORY_CARE_PROVIDER_SITE_OTHER): Payer: 59 | Admitting: Physical Medicine and Rehabilitation

## 2020-06-21 ENCOUNTER — Ambulatory Visit: Payer: Self-pay

## 2020-06-21 ENCOUNTER — Other Ambulatory Visit: Payer: Self-pay

## 2020-06-21 VITALS — BP 131/78 | HR 64

## 2020-06-21 DIAGNOSIS — M5416 Radiculopathy, lumbar region: Secondary | ICD-10-CM | POA: Diagnosis not present

## 2020-06-21 MED ORDER — DEXAMETHASONE SODIUM PHOSPHATE 10 MG/ML IJ SOLN
15.0000 mg | Freq: Once | INTRAMUSCULAR | Status: AC
  Administered 2020-06-21: 15 mg

## 2020-06-21 NOTE — Patient Instructions (Signed)

## 2020-06-21 NOTE — Progress Notes (Signed)
Pt state lower back pain that travels down his left leg to his foot.. Pt state walking, standing and sitting her a long time makes the pain worse. Pt state he take pain meds to help ease the pain.  Numeric Pain Rating Scale and Functional Assessment Average Pain 7   In the last MONTH (on 0-10 scale) has pain interfered with the following?  1. General activity like being  able to carry out your everyday physical activities such as walking, climbing stairs, carrying groceries, or moving a chair?  Rating(10)   +Driver, -BT, -Dye Allergies.

## 2020-06-21 NOTE — Progress Notes (Signed)
[name] - 55 y.o. male MRN [id number]  Date of birth: 02-09-1966  Office Visit Note: Visit Date: 06/21/2020 PCP: [name], MD Referred by: [name], MD  Subjective: Chief Complaint  Patient presents with  . Lower Back - Pain  . Left Leg - Pain  . Left Knee - Pain  . Left Foot - Pain   HPI:  [name] is a 55 y.o. male who comes in today at the request of Dr. Anderson Malta for planned Left L3-L4 Lumbar epidural steroid injection with fluoroscopic guidance.  The patient has failed conservative care including home exercise, medications, time and activity modification.  This injection will be diagnostic and hopefully therapeutic.  Please see requesting physician notes for further details and justification. MRI reviewed with images and spine model.  MRI reviewed in the note below.   ROS Otherwise per HPI.  Assessment & Plan: Visit Diagnoses:    ICD-10-CM   1. Lumbar radiculopathy  M54.16 XR C-ARM NO REPORT    Epidural Steroid injection    dexamethasone (DECADRON) injection 15 mg    Plan: No additional findings.   Meds & Orders:  Meds ordered this encounter  Medications  . dexamethasone (DECADRON) injection 15 mg    Orders Placed This Encounter  Procedures  . XR C-ARM NO REPORT  . Epidural Steroid injection    Follow-up: Return if symptoms worsen or fail to improve.   Procedures: No procedures performed  Lumbosacral Transforaminal Epidural Steroid Injection - Sub-Pedicular Approach with Fluoroscopic Guidance  Patient: [name]      Date of Birth: 09/08/1965 MRN: [id number] PCP: [name], MD      Visit Date: 06/21/2020   Universal Protocol:    Date/Time: 06/21/2020  Consent Given By: the patient  Position: PRONE  Additional Comments: Vital signs were monitored before and after the procedure. Patient was prepped and draped in the usual sterile fashion. The correct patient, procedure, and site was verified.   Injection  Procedure Details:   Procedure diagnoses: Lumbar radiculopathy [M54.16]    Meds Administered:  Meds ordered this encounter  Medications  . dexamethasone (DECADRON) injection 15 mg    Laterality: Left  Location/Site:  L3-L4  Needle:5.0 in., 22 ga.  Short bevel or Quincke spinal needle  Needle Placement: Transforaminal  Findings:    -Comments: Excellent flow of contrast along the nerve, nerve root and into the epidural space.  Procedure Details: After squaring off the end-plates to get a true AP view, the C-arm was positioned so that an oblique view of the foramen as noted above was visualized. The target area is just inferior to the "nose of the scotty dog" or sub pedicular. The soft tissues overlying this structure were infiltrated with 2-3 ml. of 1% Lidocaine without Epinephrine.  The spinal needle was inserted toward the target using a "trajectory" view along the fluoroscope beam.  Under AP and lateral visualization, the needle was advanced so it did not puncture dura and was located close the 6 O'Clock position of the pedical in AP tracterory. Biplanar projections were used to confirm position. Aspiration was confirmed to be negative for CSF and/or blood. A 1-2 ml. volume of Isovue-250 was injected and flow of contrast was noted at each level. Radiographs were obtained for documentation purposes.   After attaining the desired flow of contrast documented above, a 0.5 to 1.0 ml test dose of 0.25% Marcaine was injected into each respective transforaminal space.  The  patient was observed for 90 seconds post injection.  After no sensory deficits were reported, and normal lower extremity motor function was noted,   the above injectate was administered so that equal amounts of the injectate were placed at each foramen (level) into the transforaminal epidural space.   Additional Comments:  The patient tolerated the procedure well Dressing: 2 x 2 sterile gauze and Band-Aid     Post-procedure details: Patient was observed during the procedure. Post-procedure instructions were reviewed.  Patient left the clinic in stable condition.      Clinical History: MRI LUMBAR SPINE WITHOUT CONTRAST  TECHNIQUE: Multiplanar, multisequence MR imaging of the lumbar spine was performed. No intravenous contrast was administered.  COMPARISON:  Plain films lumbar spine from [facility] 08/16/2018. MRI lumbar spine 09/30/2017.  FINDINGS: Segmentation:  Standard.  Alignment:  Trace retrolisthesis L2 on L3 and L3 on L4 is unchanged.  Vertebrae:  No fracture, evidence of discitis, or bone lesion.  Conus medullaris and cauda equina: Conus extends to the L1-2 level. Conus and cauda equina appear normal.  Paraspinal and other soft tissues: Negative.  Disc levels:  T11-12 is imaged in the sagittal plane only and negative.  T12-L1: Negative.  L1-2: Negative.  L2-3: Shallow disc bulge.  No stenosis.  L3-4: Moderate facet degenerative disease. Shallow left subarticular recess and foraminal protrusion is unchanged. There is minimal narrowing in the left subarticular recess. The foramina remain open.  L4-5: Mild facet arthropathy.  Otherwise negative.  L5-S1: Mild facet arthropathy and a shallow disc bulge. No stenosis.  IMPRESSION: No new abnormality since the prior MRI.  Shallow left subarticular recess and foraminal protrusion at L3-4 causes mild narrowing in the left subarticular recess. No nerve root compression is identified.   Electronically Signed   By: Inge Rise M.D.   On: 12/06/2018 09:24     Objective:  VS:  HT:    WT:   BMI:     BP:131/78  HR:64bpm  TEMP: ( )  RESP:  Physical Exam Vitals and nursing note reviewed.  Constitutional:      General: He is not in acute distress.    Appearance: Normal appearance. He is not ill-appearing.  HENT:     Head: Normocephalic and atraumatic.     Right Ear:  External ear normal.     Left Ear: External ear normal.     Nose: No congestion.  Eyes:     Extraocular Movements: Extraocular movements intact.  Cardiovascular:     Rate and Rhythm: Normal rate.     Pulses: Normal pulses.  Pulmonary:     Effort: Pulmonary effort is normal. No respiratory distress.  Abdominal:     General: There is no distension.     Palpations: Abdomen is soft.  Musculoskeletal:        General: No tenderness or signs of injury.     Cervical back: Neck supple.     Right lower leg: No edema.     Left lower leg: No edema.     Comments: Patient has good distal strength without clonus.  Skin:    Findings: No erythema or rash.  Neurological:     General: No focal deficit present.     Mental Status: He is alert and oriented to person, place, and time.     Sensory: No sensory deficit.     Motor: No weakness or abnormal muscle tone.     Coordination: Coordination normal.  Psychiatric:  Mood and Affect: Mood normal.        Behavior: Behavior normal.      Imaging: No results found.

## 2020-07-07 ENCOUNTER — Other Ambulatory Visit: Payer: Self-pay | Admitting: Internal Medicine

## 2020-07-19 LAB — HM DIABETES EYE EXAM

## 2020-07-26 ENCOUNTER — Ambulatory Visit (INDEPENDENT_AMBULATORY_CARE_PROVIDER_SITE_OTHER): Payer: 59 | Admitting: Podiatry

## 2020-07-26 ENCOUNTER — Other Ambulatory Visit: Payer: Self-pay

## 2020-07-26 DIAGNOSIS — M722 Plantar fascial fibromatosis: Secondary | ICD-10-CM

## 2020-07-27 ENCOUNTER — Other Ambulatory Visit: Payer: Self-pay | Admitting: Internal Medicine

## 2020-07-27 DIAGNOSIS — F411 Generalized anxiety disorder: Secondary | ICD-10-CM

## 2020-07-27 DIAGNOSIS — F331 Major depressive disorder, recurrent, moderate: Secondary | ICD-10-CM

## 2020-07-27 NOTE — Procedures (Signed)
Lumbosacral Transforaminal Epidural Steroid Injection - Sub-Pedicular Approach with Fluoroscopic Guidance  Patient: [name]      Date of Birth: [birth year] MRN: [id number] PCP: [name], MD      Visit Date: 06/21/2020   Universal Protocol:    Date/Time: 06/21/2020  Consent Given By: the patient  Position: PRONE  Additional Comments: Vital signs were monitored before and after the procedure. Patient was prepped and draped in the usual sterile fashion. The correct patient, procedure, and site was verified.   Injection Procedure Details:   Procedure diagnoses: Lumbar radiculopathy [M54.16]    Meds Administered:  Meds ordered this encounter  Medications  . dexamethasone (DECADRON) injection 15 mg    Laterality: Left  Location/Site:  L3-L4  Needle:5.0 in., 22 ga.  Short bevel or Quincke spinal needle  Needle Placement: Transforaminal  Findings:    -Comments: Excellent flow of contrast along the nerve, nerve root and into the epidural space.  Procedure Details: After squaring off the end-plates to get a true AP view, the C-arm was positioned so that an oblique view of the foramen as noted above was visualized. The target area is just inferior to the "nose of the scotty dog" or sub pedicular. The soft tissues overlying this structure were infiltrated with 2-3 ml. of 1% Lidocaine without Epinephrine.  The spinal needle was inserted toward the target using a "trajectory" view along the fluoroscope beam.  Under AP and lateral visualization, the needle was advanced so it did not puncture dura and was located close the 6 O'Clock position of the pedical in AP tracterory. Biplanar projections were used to confirm position. Aspiration was confirmed to be negative for CSF and/or blood. A 1-2 ml. volume of Isovue-250 was injected and flow of contrast was noted at each level. Radiographs were obtained for documentation purposes.   After attaining the desired flow of  contrast documented above, a 0.5 to 1.0 ml test dose of 0.25% Marcaine was injected into each respective transforaminal space.  The patient was observed for 90 seconds post injection.  After no sensory deficits were reported, and normal lower extremity motor function was noted,   the above injectate was administered so that equal amounts of the injectate were placed at each foramen (level) into the transforaminal epidural space.   Additional Comments:  The patient tolerated the procedure well Dressing: 2 x 2 sterile gauze and Band-Aid    Post-procedure details: Patient was observed during the procedure. Post-procedure instructions were reviewed.  Patient left the clinic in stable condition.

## 2020-07-28 ENCOUNTER — Encounter (HOSPITAL_COMMUNITY): Payer: Self-pay | Admitting: Psychiatry

## 2020-07-28 ENCOUNTER — Telehealth (INDEPENDENT_AMBULATORY_CARE_PROVIDER_SITE_OTHER): Payer: 59 | Admitting: Psychiatry

## 2020-07-28 ENCOUNTER — Other Ambulatory Visit: Payer: Self-pay

## 2020-07-28 DIAGNOSIS — F419 Anxiety disorder, unspecified: Secondary | ICD-10-CM

## 2020-07-28 DIAGNOSIS — F331 Major depressive disorder, recurrent, moderate: Secondary | ICD-10-CM | POA: Diagnosis not present

## 2020-07-28 MED ORDER — BUPROPION HCL ER (XL) 300 MG PO TB24: 300.0000 mg | ORAL_TABLET | Freq: Every day | ORAL | 2 refills | Status: DC

## 2020-07-28 MED ORDER — ARIPIPRAZOLE 5 MG PO TABS: 5.0000 mg | ORAL_TABLET | Freq: Every day | ORAL | 2 refills | Status: DC

## 2020-07-28 NOTE — Progress Notes (Signed)
Virtual Visit via Telephone Note  I connected with [name] on 07/28/20 at  3:20 PM EDT by telephone and verified that I am speaking with the correct person using two identifiers.  Location: Patient: Home Provider: Home Office   I discussed the limitations, risks, security and privacy concerns of performing an evaluation and management service by telephone and the availability of in person appointments. I also discussed with the patient that there may be a patient responsible charge related to this service. The patient expressed understanding and agreed to proceed.   History of Present Illness: Patient is evaluated by phone session.  He is doing better as sleep is improved from the past.  He is taking trazodone 150 mg at bedtime.  Most of his psychiatric medication is given by his PCP.  He is also happy that his wife is making progress and doing physical therapy.  His wife is walking better with the help of walker.  Patient noticed that his thinking is much better and he is not as anxious and less depressed.  Denies any crying spells.  He has no tremors shakes or any EPS.  Recently he has seen his PCP and blood work drawn.  His hemoglobin A1c is improved from the past.  His total cholesterol is normal.  He is trying to lose weight and he has lost 3 pounds since the last visit.  He is more active.  Denies drinking or using any illegal substances.  He lives with his wife in a camper.  His daughter is pregnant and due in May.  He had missed appointment with [facility] but like to restart therapy.  Past Psychiatric History:Reviewed. H/Odepression and anxiety. No h/osuicidal attempt or inpatient treatment. Seen Dr. Jake [name] and given Zoloft, Klonopin and Cymbalta. Later PCP added BuSpar and trazodone.We tried Lamictal but did not help.  Recent Results (from the past 2160 hour(s))  TSH     Status: None   Collection Time: 05/28/20 10:42 AM  Result Value Ref Range   TSH 2.41 0.35 - 4.50 uIU/mL   Microalbumin / creatinine urine ratio     Status: None   Collection Time: 05/28/20 10:42 AM  Result Value Ref Range   Microalb, Ur <0.7 0.0 - 1.9 mg/dL   Creatinine,U 68.8 mg/dL   Microalb Creat Ratio 1.0 0.0 - 30.0 mg/g  Comprehensive metabolic panel     Status: Abnormal   Collection Time: 05/28/20 10:42 AM  Result Value Ref Range   Sodium 140 135 - 145 mEq/L   Potassium 4.0 3.5 - 5.1 mEq/L   Chloride 102 96 - 112 mEq/L   CO2 32 19 - 32 mEq/L   Glucose, Bld 94 70 - 99 mg/dL   BUN 14 6 - 23 mg/dL   Creatinine, Ser 0.78 0.40 - 1.50 mg/dL   Total Bilirubin 0.6 0.2 - 1.2 mg/dL   Alkaline Phosphatase 126 (H) 39 - 117 U/L   AST 17 0 - 37 U/L   ALT 21 0 - 53 U/L   Total Protein 6.9 6.0 - 8.3 g/dL   Albumin 4.4 3.5 - 5.2 g/dL   GFR 100.36 >60.00 mL/min    Comment: Calculated using the CKD-EPI Creatinine Equation (2021)   Calcium 9.7 8.4 - 10.5 mg/dL  Lipid panel     Status: Abnormal   Collection Time: 05/28/20 10:42 AM  Result Value Ref Range   Cholesterol 148 0 - 200 mg/dL    Comment: ATP III Classification  Desirable:  < 200 mg/dL               Borderline High:  200 - 239 mg/dL          High:  > = 240 mg/dL   Triglycerides 165.0 (H) 0.0 - 149.0 mg/dL    Comment: Normal:  <150 mg/dLBorderline High:  150 - 199 mg/dL   HDL 31.20 (L) >39.00 mg/dL   VLDL 33.0 0.0 - 40.0 mg/dL   LDL Cholesterol 84 0 - 99 mg/dL   Total CHOL/HDL Ratio 5     Comment:                Men          Women1/2 Average Risk     3.4          3.3Average Risk          5.0          4.42X Average Risk          9.6          7.13X Average Risk          15.0          11.0                       NonHDL 116.75     Comment: NOTE:  Non-HDL goal should be 30 mg/dL higher than patient's LDL goal (i.e. LDL goal of < 70 mg/dL, would have non-HDL goal of < 100 mg/dL)  Hemoglobin A1c     Status: None   Collection Time: 05/28/20 10:42 AM  Result Value Ref Range   Hgb A1c MFr Bld 6.4 4.6 - 6.5 %    Comment: Glycemic Control  Guidelines for People with Diabetes:Non Diabetic:  <6%Goal of Therapy: <7%Additional Action Suggested:  >8%   HM DIABETES EYE EXAM     Status: None   Collection Time: 07/19/20 12:00 AM  Result Value Ref Range   HM Diabetic Eye Exam No Retinopathy No Retinopathy    Psychiatric Specialty Exam: Physical Exam  Review of Systems  Weight 221 lb (100.2 kg).There is no height or weight on file to calculate BMI.  General Appearance: NA  Eye Contact:  NA  Speech:  Clear and Coherent  Volume:  Normal  Mood:  Euthymic  Affect:  NA  Thought Process:  Goal Directed  Orientation:  Full (Time, Place, and Person)  Thought Content:  Logical  Suicidal Thoughts:  No  Homicidal Thoughts:  No  Memory:  Immediate;   Good Recent;   Fair Remote;   Fair  Judgement:  Intact  Insight:  Present  Psychomotor Activity:  NA  Concentration:  Concentration: Fair and Attention Span: Fair  Recall:  [employer] of Knowledge:  Good  Language:  Good  Akathisia:  No  Handed:  Right  AIMS (if indicated):     Assets:  Communication Skills Desire for Improvement Housing Resilience Social Support  ADL's:  Intact  Cognition:  WNL  Sleep:   better     Assessment and Plan: Major depressive disorder, recurrent.  Anxiety  Patient is stable on his current medication.  He is feeling better since his sleep is improved with increased trazodone.  He is taking trazodone 150 mg at bedtime, and his gabapentin, Klonopin, BuSpar is also given by the PCP.  He also takes muscle relaxant.  He does not want to cut down the medication because it.  They are working well and his symptoms are manageable.  I will continue Wellbutrin XL 300 mg in the morning and Abilify 5 mg daily.  I reviewed blood work results.  His hemoglobin A1c is slightly improved from the past.  Recommended to call us back if is any question or any concern.  We will resume therapy with [name].  Follow-up in 3 months.  Follow Up Instructions:    I  discussed the assessment and treatment plan with the patient. The patient was provided an opportunity to ask questions and all were answered. The patient agreed with the plan and demonstrated an understanding of the instructions.   The patient was advised to call back or seek an in-person evaluation if the symptoms worsen or if the condition fails to improve as anticipated.  I provided 19 minutes of non-face-to-face time during this encounter.   Kathlee Nations, MD

## 2020-08-02 ENCOUNTER — Other Ambulatory Visit (HOSPITAL_COMMUNITY): Payer: 59

## 2020-08-04 NOTE — Progress Notes (Signed)
   Subjective: 55 y.o. male presenting for recurring pain to the bilateral heels.  Patient states that the injections are not helping.  When first standing up after sitting for prolonged period of time he has severe pain and tenderness.  He presents for further treatment evaluation   Past Medical History:  Diagnosis Date  . Acute medial meniscus tear of left knee   . 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 Anxiety   . Anxiety and depression   . Arthritis    knees and shoulders  . Benzodiazepine withdrawal with complication ([place]) 07/18/7122  . Bruit    L  . Chest pain    hx  . Cognitive complaints with normal neuropsychological exam 10/23/2016  . Diabetes mellitus without complication ([place])    "borderline", diet controlled, no meds, patient has lost 30 lbs  . Edema   . Fatty liver   . GERD (gastroesophageal reflux disease)    uses Omeprazole  . Goiter   . HLD (hyperlipidemia)   . Hypertension    essential, benign  . Hypothyroidism   . Impotence of organic origin   . Murmur    never has caused any problems  . Neuromuscular disorder ([place])   . Other chest pain    tightness, pressure  . Palpitation    hx  . Precordial pain   . Sleep apnea    uses cpap     Objective: Physical Exam General: The patient is alert and oriented x3 in no acute distress.  Dermatology: Skin is warm, dry and supple bilateral lower extremities. Negative for open lesions or macerations bilateral.   Vascular: Dorsalis Pedis and Posterior Tibial pulses palpable bilateral.  Capillary fill time is immediate to all digits.  Neurological: Epicritic and protective threshold intact bilateral.   Musculoskeletal: Tenderness to palpation to the plantar aspect of the bilateral heels along the plantar fascia. All other joints range of motion within normal limits bilateral. Strength 5/5 in all groups bilateral.  Not adhered fibromas noted along the plantar fascia of the right foot with some  associated tenderness to palpation  Assessment: 1. plantar fasciitis bilateral feet 2.  Pain plantar fibromas right foot  Plan of Care:  1. Patient evaluated. Xrays reviewed.   2.  Today we are going to try physical therapy.  Prescription for physical therapy placed to [facility] 3.  Continue wearing good supportive shoes and sneakers 4.  Return to clinic in 6 weeks  Edrick Kins, DPM [facility]  Dr. Edrick Kins, DPM    [address], [place] [address]                Office [phone]  Fax [phone]

## 2020-08-16 ENCOUNTER — Other Ambulatory Visit (HOSPITAL_COMMUNITY): Payer: Self-pay | Admitting: Psychiatry

## 2020-08-16 ENCOUNTER — Other Ambulatory Visit: Payer: Self-pay | Admitting: Internal Medicine

## 2020-08-16 DIAGNOSIS — F419 Anxiety disorder, unspecified: Secondary | ICD-10-CM

## 2020-08-16 DIAGNOSIS — F331 Major depressive disorder, recurrent, moderate: Secondary | ICD-10-CM

## 2020-08-19 ENCOUNTER — Telehealth: Payer: Self-pay | Admitting: Orthopedic Surgery

## 2020-08-19 ENCOUNTER — Inpatient Hospital Stay: Payer: 59 | Admitting: Orthopedic Surgery

## 2020-08-19 NOTE — Telephone Encounter (Signed)
Patient is scheduled for left total knee with Dr. Marlou Sa on June 16th.  Please note: He would like to go to Physical Therapy in [place] (where he lives) when it's time to start out patient physical therapy.     [place]  Phone  [phone] Fax  [phone]    Please let patient know when order has been sent.  cb  [phone]

## 2020-08-19 NOTE — Telephone Encounter (Signed)
Will have Dr Dean/[name] to write order

## 2020-08-26 ENCOUNTER — Ambulatory Visit: Payer: 59 | Admitting: Internal Medicine

## 2020-08-30 NOTE — Telephone Encounter (Signed)
faxed

## 2020-08-30 NOTE — Telephone Encounter (Signed)
Prescription given to you

## 2020-08-31 ENCOUNTER — Telehealth: Payer: Self-pay | Admitting: *Deleted

## 2020-08-31 NOTE — Telephone Encounter (Signed)
Denise with [facility] called wanting to know the Date to start PT and protocol for PT and records to be sent over.  p- [phone] f- [phone]

## 2020-08-31 NOTE — Telephone Encounter (Signed)
Pls advise on protocol.

## 2020-08-31 NOTE — Telephone Encounter (Signed)
Pls advise.  

## 2020-09-01 NOTE — Telephone Encounter (Signed)
faxed

## 2020-09-01 NOTE — Telephone Encounter (Signed)
Okay for leg strengthening exercises to focus on quadriceps hamstring abductor's and adductor's.  Avoid loadbearing exercises as much as possible.

## 2020-09-02 ENCOUNTER — Other Ambulatory Visit: Payer: Self-pay

## 2020-09-02 ENCOUNTER — Ambulatory Visit (INDEPENDENT_AMBULATORY_CARE_PROVIDER_SITE_OTHER): Payer: 59 | Admitting: Internal Medicine

## 2020-09-02 ENCOUNTER — Encounter: Payer: Self-pay | Admitting: Internal Medicine

## 2020-09-02 VITALS — BP 138/88 | HR 61 | Temp 96.8°F | Resp 15 | Ht 72.0 in | Wt 226.0 lb

## 2020-09-02 DIAGNOSIS — E1121 Type 2 diabetes mellitus with diabetic nephropathy: Secondary | ICD-10-CM | POA: Diagnosis not present

## 2020-09-02 DIAGNOSIS — E1169 Type 2 diabetes mellitus with other specified complication: Secondary | ICD-10-CM

## 2020-09-02 DIAGNOSIS — R600 Localized edema: Secondary | ICD-10-CM

## 2020-09-02 DIAGNOSIS — Z01810 Encounter for preprocedural cardiovascular examination: Secondary | ICD-10-CM | POA: Diagnosis not present

## 2020-09-02 DIAGNOSIS — Z01818 Encounter for other preprocedural examination: Secondary | ICD-10-CM

## 2020-09-02 DIAGNOSIS — E785 Hyperlipidemia, unspecified: Secondary | ICD-10-CM

## 2020-09-02 DIAGNOSIS — M4802 Spinal stenosis, cervical region: Secondary | ICD-10-CM

## 2020-09-02 DIAGNOSIS — R9431 Abnormal electrocardiogram [ECG] [EKG]: Secondary | ICD-10-CM | POA: Insufficient documentation

## 2020-09-02 DIAGNOSIS — R609 Edema, unspecified: Secondary | ICD-10-CM

## 2020-09-02 LAB — COMPREHENSIVE METABOLIC PANEL
ALT: 40 U/L (ref 0–53)
AST: 36 U/L (ref 0–37)
Albumin: 4.2 g/dL (ref 3.5–5.2)
Alkaline Phosphatase: 111 U/L (ref 39–117)
BUN: 8 mg/dL (ref 6–23)
CO2: 34 mEq/L — ABNORMAL HIGH (ref 19–32)
Calcium: 9.1 mg/dL (ref 8.4–10.5)
Chloride: 100 mEq/L (ref 96–112)
Creatinine, Ser: 0.7 mg/dL (ref 0.40–1.50)
GFR: 103.5 mL/min (ref >60.00)
Glucose, Bld: 120 mg/dL — ABNORMAL HIGH (ref 70–99)
Potassium: 3.7 mEq/L (ref 3.5–5.1)
Sodium: 141 mEq/L (ref 135–145)
Total Bilirubin: 0.5 mg/dL (ref 0.2–1.2)
Total Protein: 6.4 g/dL (ref 6.0–8.3)

## 2020-09-02 LAB — CBC WITH DIFFERENTIAL/PLATELET
Basophils Absolute: 0 10*3/uL (ref 0.0–0.1)
Basophils Relative: 0.9 % (ref 0.0–3.0)
Eosinophils Absolute: 0.2 10*3/uL (ref 0.0–0.7)
Eosinophils Relative: 4.1 % (ref 0.0–5.0)
HCT: 42.7 % (ref 39.0–52.0)
Hemoglobin: 14.8 g/dL (ref 13.0–17.0)
Lymphocytes Relative: 16.3 % (ref 12.0–46.0)
Lymphs Abs: 0.8 10*3/uL (ref 0.7–4.0)
MCHC: 34.7 g/dL (ref 30.0–36.0)
MCV: 92.3 fl (ref 78.0–100.0)
Monocytes Absolute: 0.4 10*3/uL (ref 0.1–1.0)
Monocytes Relative: 8.2 % (ref 3.0–12.0)
Neutro Abs: 3.6 10*3/uL (ref 1.4–7.7)
Neutrophils Relative %: 70.5 % (ref 43.0–77.0)
Platelets: 230 10*3/uL (ref 150.0–400.0)
RBC: 4.63 Mil/uL (ref 4.22–5.81)
RDW: 13.6 % (ref 11.5–15.5)
WBC: 5.1 10*3/uL (ref 4.0–10.5)

## 2020-09-02 LAB — HEMOGLOBIN A1C: Hgb A1c MFr Bld: 6.9 % — ABNORMAL HIGH (ref 4.6–6.5)

## 2020-09-02 MED ORDER — FUROSEMIDE 20 MG PO TABS: 20.0000 mg | ORAL_TABLET | ORAL | 3 refills | Status: DC

## 2020-09-02 MED ORDER — HYDROCODONE-ACETAMINOPHEN 10-325 MG PO TABS
1.0000 | ORAL_TABLET | Freq: Four times a day (QID) | ORAL | 0 refills | Status: DC | PRN
Start: 2020-10-02 — End: 2020-12-01

## 2020-09-02 MED ORDER — HYDROCODONE-ACETAMINOPHEN 10-325 MG PO TABS: 1.0000 | ORAL_TABLET | Freq: Two times a day (BID) | ORAL | 0 refills | Status: DC | PRN

## 2020-09-02 MED ORDER — CLONAZEPAM 1 MG PO TABS: ORAL_TABLET | ORAL | 5 refills | Status: DC

## 2020-09-02 NOTE — Assessment & Plan Note (Addendum)
Etiology unclear.  No cardiac history but no prior evaluation .  I am prescribing every other day furosemide 20 mg and referring to  Cardiology for preoperative evaluation needed   Lab Results  Component Value Date   NA 141 09/02/2020   K 3.7 09/02/2020   CL 100 09/02/2020   CO2 34 (H) 09/02/2020   Lab Results  Component Value Date   CREATININE 0.70 09/02/2020

## 2020-09-02 NOTE — Assessment & Plan Note (Signed)
Managed with atorvastatin 20 mg daily. LDL is < 100 Lab Results  Component Value Date   CHOL 148 05/28/2020   HDL 31.20 (L) 05/28/2020   LDLCALC 84 05/28/2020   LDLDIRECT 101.0 10/16/2017   TRIG 165.0 (H) 05/28/2020   CHOLHDL 5 05/28/2020

## 2020-09-02 NOTE — Assessment & Plan Note (Signed)
I am referring him for cardiac evaluation given his abnormal EKG and multiple risk factors for CAD

## 2020-09-02 NOTE — Patient Instructions (Signed)
I have done your preoperative evaluation today in anticipation for a request I may receive from Dr Marlou Sa in the near future  I have prescribed furosemide to use not more than every other day for the fluid reenetion in your legs  Refills on clonazepam and hydrocodone sent today

## 2020-09-02 NOTE — Assessment & Plan Note (Addendum)
Preoperative evaluation today included a baseline EKG, which I have reviewed and find to be abnormal due to short PR segment ,  Widened QRS . and poor R wave progression.  Given the presence of a LAFB, he would benefit from a preoperative cardiac evaluation .  Referring to dr Wonda Cerise in progress.

## 2020-09-02 NOTE — Progress Notes (Signed)
Subjective:  Patient ID: [name], male    DOB: March 11, 1966  Age: 55 y.o. MRN: [id number]  CC: The primary encounter diagnosis was Preoperative cardiovascular examination. 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 .  This visit occurred during the SARS-CoV-2 public health emergency.  Safety protocols were in place, including screening questions prior to the visit, additional usage of staff PPE, and extensive cleaning of exam room while observing appropriate contact time as indicated for disinfecting solutions.   GAD/Depression:  He is seeing Psychiatry for depression management . Last appt was in April;  OV reviewed;   no changes to regimen.  He is homesick for [place], but has not been able to find an affordable apartment.   He and Tonya  Have had devastating financial loss due to his disability and her medical issues. They have been living in a camper in [place].  Fortunately both are receiving monthly disability payments totalling  Totaling $3900/month, but their overhead includes significant medical costs, leaving them a maximal rent capability of $1000/month .  He remains dependent on clonazepam twice daily and has a history of benzo withdrawal in the past when his prescription ran out    He is scheduled to have a TKR on the left  in June by Dr [facility] [place] .  The surgery is scheduled for June 16 at [facility].  A preoperative evaluation  has NOT been requested ,  But was done today    He has type  2 DM managed without insulin,  And has normal renal function .  He has no history of chest pain and no prior cardiac risk stratification.   No prior adverse reaction to anesthesia  No cough or shortness of breath   T2DM:  he  feels generally well,  But is not  exercising regularly due to knee pain. Checking  blood sugars less than once daily at variable times, usually only if he feels he may be having a hypoglycemic event. .  BS have been under 130 fasting and < 150 post prandially.  Denies any recent hypoglyemic events.  Tolerating metformin as directed. Following a carbohydrate modified diet 6 days per week. Denies numbness, burning and tingling of extremities. Appetite is good.    Outpatient Medications Prior to Visit  Medication Sig Dispense Refill  . amLODipine (NORVASC) 5 MG tablet Take 1 tablet (5 mg total) by mouth daily. 90 tablet 3  . ARIPiprazole (ABILIFY) 5 MG tablet Take 1 tablet (5 mg total) by mouth at bedtime. 30 tablet 2  . aspirin EC 81 MG tablet Take 1 tablet (81 mg total) by mouth daily. Swallow whole. 30 tablet 11  . atorvastatin (LIPITOR) 20 MG tablet Take 1 tablet (20 mg total) by mouth daily. 90 tablet 3  . blood glucose meter kit and supplies Dispense based on patient and insurance preference. Use to check blood sugars once daily. 1 each 0  . buPROPion (WELLBUTRIN XL) 300 MG 24 hr tablet Take 1 tablet (300 mg total) by mouth daily. 30 tablet 2  . busPIRone (BUSPAR) 15  MG tablet TAKE 1 TABLET BY MOUTH THREE TIMES DAILY 90 tablet 0  . celecoxib (CELEBREX) 200 MG capsule Take 1 capsule (200 mg total) by mouth daily. 90 capsule 1  . clobetasol (TEMOVATE) 0.05 % external solution Apply 1 application topically 2 (two) times daily as needed (scalp irritation).    . Clobetasol Propionate 0.05 % shampoo Apply to dry scalp and leave on for 15 minutes (Patient taking differently: Apply 1 application topically 2 (two) times a week. Apply to dry scalp and leave on for 15 minutes as  needed for itching.) 118 mL 5  . dicyclomine (BENTYL) 20 MG tablet TAKE 1 TABLET BY MOUTH EVERY 6 HOURS 120 tablet 0  . EUTHYROX 88 MCG tablet TAKE 1 TABLET BY MOUTH ONCE DAILY BEFORE BREAKFAST 90 tablet 0  . gabapentin (NEURONTIN) 600 MG tablet TAKE 1 TABLET BY MOUTH THREE TIMES DAILY 270 tablet 0  . glucose blood test strip Use once daily to check BS at various times  E11.9 100 each 12  . HYDROcodone-acetaminophen (NORCO) 10-325 MG tablet Take 1 tablet by mouth 2 (two) times daily as needed. 60 tablet 0  . losartan (COZAAR) 100 MG tablet Take 1 tablet (100 mg total) by mouth daily. 90 tablet 3  . metFORMIN (GLUCOPHAGE) 500 MG tablet Take 1 tablet (500 mg total) by mouth 2 (two) times daily with a meal. 180 tablet 3  . methocarbamol (ROBAXIN) 500 MG tablet Take 1 tablet (500 mg total) by mouth every 8 (eight) hours as needed. 90 tablet 11  . metoprolol tartrate (LOPRESSOR) 25 MG tablet Take 1 tablet by mouth twice daily 180 tablet 0  . mometasone (ELOCON) 0.1 % cream Apply 1 application topically daily. To ear canal as needed for itching 45 g 0  . Multiple Vitamin (MULTIVITAMIN WITH MINERALS) TABS tablet Take 1 tablet by mouth daily.    Marland Kitchen omeprazole (PRILOSEC) 40 MG capsule Take 1 capsule (40 mg total) by mouth in the morning and at bedtime. 180 capsule 1  . promethazine (PHENERGAN) 12.5 MG tablet TAKE 1 TABLET BY MOUTH EVERY 8 HOURS AS NEEDED FOR NAUSEA AND  VOMITING 20 tablet 0  . SUPER B COMPLEX/C PO Take 1 tablet by mouth daily.    . traZODone (DESYREL) 100 MG tablet TAKE 1 & 1/2 (ONE & ONE-HALF) TABLETS BY MOUTH AT BEDTIME 135 tablet 0  . clonazePAM (KLONOPIN) 1 MG tablet TAKE 1 TABLET BY MOUTH TWICE DAILY AS NEEDED FOR ANXIETY 60 tablet 5  . HYDROcodone-acetaminophen (NORCO) 10-325 MG tablet Take 1 tablet by mouth 2 (two) times daily as needed. 60 tablet 0  . HYDROcodone-acetaminophen (NORCO) 10-325 MG tablet Take 1 tablet by mouth 2 (two) times daily as needed. 60 tablet 0   No  facility-administered medications prior to visit.    Review of Systems;  Patient denies headache, fevers, malaise, unintentional weight loss, skin rash, eye pain, sinus congestion and sinus pain, sore throat, dysphagia,  hemoptysis , cough, dyspnea, wheezing, chest pain, palpitations, orthopnea, edema, abdominal pain, nausea, melena, diarrhea, constipation, flank pain, dysuria, hematuria, urinary  Frequency, nocturia, numbness, tingling, seizures,  Focal weakness, Loss of consciousness,  Tremor, insomnia, depression, anxiety, and suicidal ideation.      Objective:  BP 138/88 (BP Location: Left Arm, Patient Position: Sitting, Cuff Size: Large)   Pulse 61   Temp (!) 96.8 F (36 C) (Temporal)   Resp 15   Ht 6' (1.829 m)   Wt 226 lb (102.5 kg)   SpO2 97%  BMI 30.65 kg/m   BP Readings from Last 3 Encounters:  09/02/20 138/88  06/21/20 131/78  05/28/20 126/78    Wt Readings from Last 3 Encounters:  09/02/20 226 lb (102.5 kg)  07/28/20 221 lb (100.2 kg)  05/28/20 220 lb 12.8 oz (100.2 kg)    General appearance: alert, cooperative and appears stated age Ears: normal TM's and external ear canals both ears Throat: lips, mucosa, and tongue normal; teeth and gums normal Neck: no adenopathy, no carotid bruit, supple, symmetrical, trachea midline and thyroid not enlarged, symmetric, no tenderness/mass/nodules Back: symmetric, no curvature. ROM normal. No CVA tenderness. Lungs: clear to auscultation bilaterally Heart: regular rate and rhythm, S1, S2 normal, no murmur, click, rub or gallop Abdomen: soft, non-tender; bowel sounds normal; no masses,  no organomegaly Pulses: 2+ and symmetric Skin: Skin color, texture, turgor normal. No rashes or lesions. 1+ pitting edema bilaterally Lymph nodes: Cervical, supraclavicular, and axillary nodes normal.  Lab Results  Component Value Date   HGBA1C 6.9 (H) 09/02/2020   HGBA1C 6.4 05/28/2020   HGBA1C 6.6 (H) 10/24/2019    Lab Results   Component Value Date   CREATININE 0.70 09/02/2020   CREATININE 0.78 05/28/2020   CREATININE 0.92 10/24/2019    Lab Results  Component Value Date   WBC 5.1 09/02/2020   HGB 14.8 09/02/2020   HCT 42.7 09/02/2020   PLT 230.0 09/02/2020   GLUCOSE 120 (H) 09/02/2020   CHOL 148 05/28/2020   TRIG 165.0 (H) 05/28/2020   HDL 31.20 (L) 05/28/2020   LDLDIRECT 101.0 10/16/2017   LDLCALC 84 05/28/2020   ALT 40 09/02/2020   AST 36 09/02/2020   NA 141 09/02/2020   K 3.7 09/02/2020   CL 100 09/02/2020   CREATININE 0.70 09/02/2020   BUN 8 09/02/2020   CO2 34 (H) 09/02/2020   TSH 2.41 05/28/2020   PSA 0.70 10/16/2017   INR 0.98 08/27/2015   HGBA1C 6.9 (H) 09/02/2020   MICROALBUR <0.7 05/28/2020    No results found.  Assessment & Plan:   Problem List Items Addressed This Visit      Unprioritized   1+ pitting edema    Etiology unclear.  No cardiac history but no prior evaluation .  I am prescribing every other day furosemide 20 mg and referring to  Cardiology for preoperative evaluation needed   Lab Results  Component Value Date   NA 141 09/02/2020   K 3.7 09/02/2020   CL 100 09/02/2020   CO2 34 (H) 09/02/2020   Lab Results  Component Value Date   CREATININE 0.70 09/02/2020         Abnormal electrocardiogram (ECG) (EKG)    Preoperative evaluation today included a baseline EKG, which I have reviewed and find to be abnormal due to short PR segment ,  Widened QRS . and poor R wave progression.  Given the presence of a LAFB, he would benefit from a preoperative cardiac evaluation .  Referring to dr Wonda Cerise in progress.      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   he has loss of sensation over  several areas of both feet from prior podiatry surgeries,  But his feet are in excellent condition.  Tolerating every  other day asa,  ARB  And statin   Lab Results  Component Value Date   HGBA1C 6.9 (  H) 09/02/2020   Lab Results  Component Value Date   MICROALBUR <0.7 05/28/2020   MICROALBUR 47.1 (H) 01/09/2019    Lab Results  Component Value Date   CHOL 148 05/28/2020   HDL 31.20 (L) 05/28/2020   LDLCALC 84 05/28/2020   LDLDIRECT 101.0 10/16/2017   TRIG 165.0 (H) 05/28/2020   CHOLHDL 5 05/28/2020         Relevant Orders   CBC with Differential/Platelet (Completed)   Hemoglobin A1c (Completed)   Comprehensive metabolic panel (Completed)   Hyperlipidemia associated with type 2 diabetes mellitus ([place])    Managed with atorvastatin 20 mg daily. LDL is < 100 Lab Results  Component Value Date   CHOL 148 05/28/2020   HDL 31.20 (L) 05/28/2020   LDLCALC 84 05/28/2020   LDLDIRECT 101.0 10/16/2017   TRIG 165.0 (H) 05/28/2020   CHOLHDL 5 05/28/2020         Preoperative evaluation to rule out surgical contraindication    I am referring him for cardiac evaluation given his abnormal EKG and multiple risk factors for CAD       Spinal stenosis of cervical region    His pain radiates to left shoulder due to multi level disk disease by August 2020 MRI showing moderate spinal stenosis ct C5-6 level with mild cord flattening and moderate/severe let neural foraminal narrowing . Refill history confirmed via [place] Controlled Substance databas, accessed by me today..he has not had any ER visits  And has not requested any early refills.  Refill history was confirmed via [place] Controlled Substance database by me today during  visit and there have been no prescriptions of controlled substances filled from any providers other than me. .  continue Vicodin for pain management       Other Visit Diagnoses    Preoperative cardiovascular examination    -  Primary   Relevant Orders   Ambulatory referral to Cardiology      I have changed Gillian Shields. [place] "Neil"'s HYDROcodone-acetaminophen. I am also having him start  on furosemide. Additionally, I am having him maintain his multivitamin with minerals, Clobetasol Propionate, SUPER B COMPLEX/C PO, clobetasol, methocarbamol, omeprazole, metFORMIN, losartan, amLODipine, glucose blood, mometasone, atorvastatin, blood glucose meter kit and supplies, promethazine, Euthyrox, celecoxib, HYDROcodone-acetaminophen, aspirin EC, traZODone, gabapentin, busPIRone, buPROPion, ARIPiprazole, metoprolol tartrate, dicyclomine, clonazePAM, and HYDROcodone-acetaminophen.  Meds ordered this encounter  Medications  . furosemide (LASIX) 20 MG tablet    Sig: Take 1 tablet (20 mg total) by mouth every other day.    Dispense:  45 tablet    Refill:  3  . clonazePAM (KLONOPIN) 1 MG tablet    Sig: TAKE 1 TABLET BY MOUTH TWICE DAILY AS NEEDED FOR ANXIETY    Dispense:  60 tablet    Refill:  5  . HYDROcodone-acetaminophen (NORCO) 10-325 MG tablet    Sig: Take 1 tablet by mouth 2 (two) times daily as needed.    Dispense:  60 tablet    Refill:  0  . HYDROcodone-acetaminophen (NORCO) 10-325 MG tablet    Sig: Take 1 tablet by mouth every 6 (six) hours as needed.    Dispense:  60 tablet    Refill:  0   A total of 40 minutes was spent with patient more than half of which was spent in counseling patient on his diabetes ,  Reviewing current and prior EKGs.  reviewing and explaining recent labs and imaging studies done, and coordination of care. Medications Discontinued During This Encounter  Medication Reason  .  clonazePAM (KLONOPIN) 1 MG tablet Reorder  . HYDROcodone-acetaminophen (NORCO) 10-325 MG tablet Reorder  . HYDROcodone-acetaminophen (NORCO) 10-325 MG tablet Reorder    Follow-up: Return in about 3 months (around 12/03/2020).   [name], MD

## 2020-09-02 NOTE — Assessment & Plan Note (Signed)
His pain radiates to left shoulder due to multi level disk disease by August 2020 MRI showing moderate spinal stenosis ct C5-6 level with mild cord flattening and moderate/severe let neural foraminal narrowing . Refill history confirmed via [place] Controlled Substance databas, accessed by me today..he has not had any ER visits  And has not requested any early refills.  Refill history was confirmed via [place] Controlled Substance database by me today during  visit and there have been no prescriptions of controlled substances filled from any providers other than me. .  continue Vicodin for pain management

## 2020-09-02 NOTE — Assessment & Plan Note (Signed)
Diabetes is well controlled ; he is  tolerating metformin.   he has loss of sensation over  several areas of both feet from prior podiatry surgeries,  But his feet are in excellent condition.  Tolerating every other day asa,  ARB  And statin   Lab Results  Component Value Date   HGBA1C 6.9 (H) 09/02/2020   Lab Results  Component Value Date   MICROALBUR <0.7 05/28/2020   MICROALBUR 47.1 (H) 01/09/2019    Lab Results  Component Value Date   CHOL 148 05/28/2020   HDL 31.20 (L) 05/28/2020   LDLCALC 84 05/28/2020   LDLDIRECT 101.0 10/16/2017   TRIG 165.0 (H) 05/28/2020   CHOLHDL 5 05/28/2020

## 2020-09-03 ENCOUNTER — Telehealth: Payer: Self-pay | Admitting: Internal Medicine

## 2020-09-03 ENCOUNTER — Other Ambulatory Visit: Payer: Self-pay | Admitting: Internal Medicine

## 2020-09-03 DIAGNOSIS — Z01810 Encounter for preprocedural cardiovascular examination: Secondary | ICD-10-CM

## 2020-09-03 DIAGNOSIS — R9431 Abnormal electrocardiogram [ECG] [EKG]: Secondary | ICD-10-CM

## 2020-09-03 NOTE — Telephone Encounter (Signed)
Urgent ECHO ordered;  MyChart message sent to patient to expect a call in the next 48 hours

## 2020-09-06 ENCOUNTER — Ambulatory Visit: Payer: 59 | Admitting: Podiatry

## 2020-09-07 ENCOUNTER — Telehealth: Payer: Self-pay | Admitting: *Deleted

## 2020-09-07 NOTE — Telephone Encounter (Signed)
IC advised that I was under the impression patients insurance requires him to complete PT before they will pay for his surgery.  Is this correct? Do they require a certain amt of visits be completed? Debbie from PT is asking for documentation. She stated she works with [employer] all of the time and has never had this to be an issue. Can either of you please help with this?

## 2020-09-07 NOTE — Telephone Encounter (Signed)
[name] with [facility] called stating she sees pt has upcoming surgery on June 16th and order was faxed to her stating she could go ahead and start PT, but says they do normally start PT until after pt has surgery. Needs to know when after surgery can they start PT. Please advise.   CB. [phone]

## 2020-09-08 ENCOUNTER — Encounter: Payer: Self-pay | Admitting: Cardiology

## 2020-09-08 ENCOUNTER — Other Ambulatory Visit: Payer: Self-pay

## 2020-09-08 ENCOUNTER — Ambulatory Visit (INDEPENDENT_AMBULATORY_CARE_PROVIDER_SITE_OTHER): Payer: 59 | Admitting: Cardiology

## 2020-09-08 VITALS — BP 171/91 | HR 70 | Ht 72.0 in | Wt 231.8 lb

## 2020-09-08 DIAGNOSIS — Z01810 Encounter for preprocedural cardiovascular examination: Secondary | ICD-10-CM

## 2020-09-08 DIAGNOSIS — R9431 Abnormal electrocardiogram [ECG] [EKG]: Secondary | ICD-10-CM

## 2020-09-08 DIAGNOSIS — I2510 Atherosclerotic heart disease of native coronary artery without angina pectoris: Secondary | ICD-10-CM

## 2020-09-08 DIAGNOSIS — Z01818 Encounter for other preprocedural examination: Secondary | ICD-10-CM

## 2020-09-08 MED ORDER — ATORVASTATIN CALCIUM 80 MG PO TABS: 80.0000 mg | ORAL_TABLET | Freq: Every day | ORAL | 3 refills | Status: DC

## 2020-09-08 NOTE — Telephone Encounter (Signed)
IC no answer  LMVM advising per below.  

## 2020-09-08 NOTE — Progress Notes (Signed)
HPI: FU CAD and preoperative evaluation prior to TKA. Previously followed by Dr Haroldine Laws. Cath 5/17 showed 30 Lcx. Cardiac CTA 10/20 showed Ca score 50, mild (<50) D1, mild (<50) Lcx and minimal RCA. Scheduled for TKA and added to my schedule for preoperative evaluation. Since last seen, patient denies dyspnea, chest pain, palpitations or syncope.  He is limited predominantly by pain in his left knee.  Current Outpatient Medications  Medication Sig Dispense Refill  . amLODipine (NORVASC) 5 MG tablet Take 1 tablet (5 mg total) by mouth daily. 90 tablet 3  . ARIPiprazole (ABILIFY) 5 MG tablet Take 1 tablet (5 mg total) by mouth at bedtime. 30 tablet 2  . aspirin EC 81 MG tablet Take 1 tablet (81 mg total) by mouth daily. Swallow whole. 30 tablet 11  . atorvastatin (LIPITOR) 20 MG tablet Take 1 tablet (20 mg total) by mouth daily. 90 tablet 3  . blood glucose meter kit and supplies Dispense based on patient and insurance preference. Use to check blood sugars once daily. 1 each 0  . buPROPion (WELLBUTRIN XL) 300 MG 24 hr tablet Take 1 tablet (300 mg total) by mouth daily. 30 tablet 2  . busPIRone (BUSPAR) 15 MG tablet TAKE 1 TABLET BY MOUTH THREE TIMES DAILY 90 tablet 0  . celecoxib (CELEBREX) 200 MG capsule Take 1 capsule (200 mg total) by mouth daily. 90 capsule 1  . clobetasol (TEMOVATE) 0.05 % external solution Apply 1 application topically 2 (two) times daily as needed (scalp irritation).    . Clobetasol Propionate 0.05 % shampoo Apply to dry scalp and leave on for 15 minutes (Patient taking differently: Apply 1 application topically 2 (two) times a week. Apply to dry scalp and leave on for 15 minutes as needed for itching.) 118 mL 5  . clonazePAM (KLONOPIN) 1 MG tablet TAKE 1 TABLET BY MOUTH TWICE DAILY AS NEEDED FOR ANXIETY 60 tablet 5  . dicyclomine (BENTYL) 20 MG tablet TAKE 1 TABLET BY MOUTH EVERY 6 HOURS 120 tablet 0  . EUTHYROX 88 MCG tablet TAKE 1 TABLET BY MOUTH ONCE DAILY BEFORE  BREAKFAST 90 tablet 0  . furosemide (LASIX) 20 MG tablet Take 1 tablet (20 mg total) by mouth every other day. 45 tablet 3  . gabapentin (NEURONTIN) 600 MG tablet TAKE 1 TABLET BY MOUTH THREE TIMES DAILY 270 tablet 0  . glucose blood test strip Use once daily to check BS at various times  E11.9 100 each 12  . HYDROcodone-acetaminophen (NORCO) 10-325 MG tablet Take 1 tablet by mouth 2 (two) times daily as needed. 60 tablet 0  . [START ON 11/01/2020] HYDROcodone-acetaminophen (NORCO) 10-325 MG tablet Take 1 tablet by mouth 2 (two) times daily as needed. 60 tablet 0  . [START ON 10/02/2020] HYDROcodone-acetaminophen (NORCO) 10-325 MG tablet Take 1 tablet by mouth every 6 (six) hours as needed. 60 tablet 0  . losartan (COZAAR) 100 MG tablet Take 1 tablet (100 mg total) by mouth daily. 90 tablet 3  . metFORMIN (GLUCOPHAGE) 500 MG tablet Take 1 tablet (500 mg total) by mouth 2 (two) times daily with a meal. 180 tablet 3  . methocarbamol (ROBAXIN) 500 MG tablet Take 1 tablet (500 mg total) by mouth every 8 (eight) hours as needed. 90 tablet 11  . metoprolol tartrate (LOPRESSOR) 25 MG tablet Take 1 tablet by mouth twice daily 180 tablet 0  . mometasone (ELOCON) 0.1 % cream Apply 1 application topically daily. To ear canal as  needed for itching 45 g 0  . Multiple Vitamin (MULTIVITAMIN WITH MINERALS) TABS tablet Take 1 tablet by mouth daily.    Marland Kitchen omeprazole (PRILOSEC) 40 MG capsule Take 1 capsule (40 mg total) by mouth in the morning and at bedtime. 180 capsule 1  . promethazine (PHENERGAN) 12.5 MG tablet TAKE 1 TABLET BY MOUTH EVERY 8 HOURS AS NEEDED FOR NAUSEA AND  VOMITING 20 tablet 0  . SUPER B COMPLEX/C PO Take 1 tablet by mouth daily.    . traZODone (DESYREL) 100 MG tablet TAKE 1 & 1/2 (ONE & ONE-HALF) TABLETS BY MOUTH AT BEDTIME 135 tablet 0   No current facility-administered medications for this visit.     Past Medical History:  Diagnosis Date  . Acute medial meniscus tear of left knee   .  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 Anxiety   . Anxiety and depression   . Arthritis    knees and shoulders  . Benzodiazepine withdrawal with complication ([place]) 1/61/0960  . Bruit    L  . Chest pain    hx  . Cognitive complaints with normal neuropsychological exam 10/23/2016  . Diabetes mellitus without complication ([place])    "borderline", diet controlled, no meds, patient has lost 30 lbs  . Edema   . Fatty liver   . GERD (gastroesophageal reflux disease)    uses Omeprazole  . Goiter   . HLD (hyperlipidemia)   . Hypertension    essential, benign  . Hypothyroidism   . Impotence of organic origin   . Murmur    never has caused any problems  . Neuromuscular disorder ([place])   . Other chest pain    tightness, pressure  . Palpitation    hx  . Precordial pain   . Sleep apnea    uses cpap    Past Surgical History:  Procedure Laterality Date  . APPENDECTOMY  2002   done at [facility]  . CARDIAC CATHETERIZATION    . CARDIAC CATHETERIZATION N/A 08/27/2015   Procedure: Left Heart Cath and Coronary Angiography;  Surgeon: Jolaine Artist, MD;  Location: [facility];  Service: Cardiovascular;  Laterality: N/A;  . COLONOSCOPY  in his 20's  . EAR CYST EXCISION Left 07/08/2019   Procedure: open excision baker's cyst left knee;  Surgeon: Meredith Pel, MD;  Location: [facility];  Service: Orthopedics;  Laterality: Left;  . HERNIA REPAIR  4-54-09   umbilical  . JOINT REPLACEMENT     right knee  . KNEE ARTHROSCOPY Left 12/17/2018   Procedure: left knee arthroscopy, meniscal debridement, loose body removal;  Surgeon: Meredith Pel, MD;  Location: [facility];  Service: Orthopedics;  Laterality: Left;  . REVISION TOTAL KNEE ARTHROPLASTY Right 06/23/2014   DR Marlou Sa  . TONSILLECTOMY    . TOTAL KNEE ARTHROPLASTY Right 2011   right  . TOTAL KNEE REVISION Right 06/23/2014   Procedure: TOTAL KNEE REVISION;  Surgeon: Meredith Pel, MD;  Location: [facility];  Service:  Orthopedics;  Laterality: Right;  . UPPER GASTROINTESTINAL ENDOSCOPY  04/30/2013   Sharlett Iles   . WISDOM TOOTH EXTRACTION      Social History   Socioeconomic History  . Marital status: Married    Spouse name: Not on file  . Number of children: 2  . Years of education: Not on file  . Highest education level: Not on file  Occupational History  . Occupation: IT trainer  . Occupation: SERVICE  DIRECTOR    Employer: [id number]  Tobacco Use  . Smoking status: Former Smoker    Packs/day: 2.00    Years: 18.00    Pack years: 36.00    Types: Cigarettes    Quit date: 04/24/2005    Years since quitting: 15.3  . Smokeless tobacco: Never Used  . Tobacco comment: quit 20 years ago   Vaping Use  . Vaping Use: Never used  Substance and Sexual Activity  . Alcohol use: Yes    Alcohol/week: 4.0 standard drinks    Types: 4 Cans of beer per week  . Drug use: No  . Sexual activity: Yes  Other Topics Concern  . Not on file  Social History Narrative   Married, gets regular exercise.    Social Determinants of Health   Financial Resource Strain: Not on file  Food Insecurity: Not on file  Transportation Needs: Not on file  Physical Activity: Not on file  Stress: Not on file  Social Connections: Not on file  Intimate Partner Violence: Not on file    Family History  Problem Relation Age of Onset  . Heart disease Father   . Colon cancer Neg Hx   . Colon polyps Neg Hx   . Esophageal cancer Neg Hx   . Rectal cancer Neg Hx   . Stomach cancer Neg Hx     ROS: Left knee pain but no fevers or chills, productive cough, hemoptysis, dysphasia, odynophagia, melena, hematochezia, dysuria, hematuria, rash, seizure activity, orthopnea, PND, pedal edema, claudication. Remaining systems are negative.  Physical Exam: Well-developed well-nourished in no acute distress.  Skin is warm and dry.  HEENT is normal.  Neck is supple.  Chest is  clear to auscultation with normal expansion.  Cardiovascular exam is regular rate and rhythm.  Abdominal exam nontender or distended. No masses palpated. Extremities show no edema. neuro grossly intact  ECG- 09/02/20-Sinus bradycardia; cannot R/O septal and inferior MI; personally reviewed  A/P  1 Preoperative evauation prior to TKA-patient has had no cardiac symptoms.  Previous CTA showed mild disease.  Electrocardiogram shows question previous inferior and septal infarct.  I will arrange an echocardiogram to assess LV function and wall motion.  If normal he may proceed with his knee replacement without further cardiac evaluation.  2 CAD-no chest pain.  Plan medical therapy.  Continue aspirin and statin.  3 hypertension-blood pressure elevated but typically controlled at home by his report.  We will follow and add additional medications as needed.  4 Hyperlipidemia-given documented coronary disease will increase Lipitor to 80 mg daily.  Check lipids and liver in 12 weeks.  5 OSA-continue CPAP.  Kirk Ruths, MD

## 2020-09-08 NOTE — Patient Instructions (Signed)
Medication Instructions:   INCREASE ATORVASTATIN TO 80 MG ONCE DAILY -4 OF THE 20 MG TABLETS ONCE DAILY  *If you need a refill on your cardiac medications before your next appointment, please call your pharmacy*   Lab Work:  Your physician recommends that you return for lab work in:[facility]  If you have labs (blood work) drawn today and your tests are completely normal, you will receive your results only by: Marland Kitchen MyChart Message (if you have MyChart) OR . A paper copy in the mail If you have any lab test that is abnormal or we need to change your treatment, we will call you to review the results.   Testing/Procedures:  Your physician has requested that you have an echocardiogram. Echocardiography is a painless test that uses sound waves to create images of your heart. It provides your doctor with information about the size and shape of your heart and how well your heart's chambers and valves are working. This procedure takes approximately one hour. There are no restrictions for this procedure.[place]     Follow-Up: At [facility], you and your health needs are our priority.  As part of our continuing mission to provide you with exceptional heart care, we have created designated Provider Care Teams.  These Care Teams include your primary Cardiologist (physician) and Advanced Practice Providers (APPs -  Physician Assistants and Nurse Practitioners) who all work together to provide you with the care you need, when you need it.  We recommend signing up for the patient portal called "MyChart".  Sign up information is provided on this After Visit Summary.  MyChart is used to connect with patients for Virtual Visits (Telemedicine).  Patients are able to view lab/test results, encounter notes, upcoming appointments, etc.  Non-urgent messages can be sent to your provider as well.   To learn more about what you can do with MyChart, go to [email or web address].     Your next appointment:   12 month(s)  The format for your next appointment:   In Person  Provider:   Kirk Ruths, MD

## 2020-09-09 MED ORDER — PROMETHAZINE HCL 12.5 MG PO TABS: 12.5000 mg | ORAL_TABLET | ORAL | 0 refills | Status: AC | PRN

## 2020-09-16 ENCOUNTER — Other Ambulatory Visit: Payer: Self-pay | Admitting: Internal Medicine

## 2020-09-16 DIAGNOSIS — F411 Generalized anxiety disorder: Secondary | ICD-10-CM

## 2020-09-16 DIAGNOSIS — F331 Major depressive disorder, recurrent, moderate: Secondary | ICD-10-CM

## 2020-10-04 ENCOUNTER — Ambulatory Visit (HOSPITAL_COMMUNITY): Payer: 59 | Attending: Cardiovascular Disease

## 2020-10-04 ENCOUNTER — Other Ambulatory Visit: Payer: Self-pay

## 2020-10-04 DIAGNOSIS — Z01810 Encounter for preprocedural cardiovascular examination: Secondary | ICD-10-CM | POA: Insufficient documentation

## 2020-10-04 DIAGNOSIS — R9431 Abnormal electrocardiogram [ECG] [EKG]: Secondary | ICD-10-CM | POA: Diagnosis not present

## 2020-10-04 LAB — ECHOCARDIOGRAM COMPLETE
Area-P 1/2: 3.82 cm2
P 1/2 time: 497 msec
S' Lateral: 3.1 cm

## 2020-10-05 ENCOUNTER — Other Ambulatory Visit: Payer: Self-pay | Admitting: Internal Medicine

## 2020-10-05 DIAGNOSIS — F331 Major depressive disorder, recurrent, moderate: Secondary | ICD-10-CM

## 2020-10-05 DIAGNOSIS — F411 Generalized anxiety disorder: Secondary | ICD-10-CM

## 2020-10-07 ENCOUNTER — Ambulatory Visit: Admit: 2020-10-07 | Payer: 59 | Admitting: Orthopedic Surgery

## 2020-10-07 SURGERY — ARTHROPLASTY, KNEE, TOTAL
Anesthesia: Spinal | Site: Knee | Laterality: Left

## 2020-10-21 ENCOUNTER — Encounter: Payer: 59 | Admitting: Orthopedic Surgery

## 2020-10-25 ENCOUNTER — Other Ambulatory Visit: Payer: Self-pay | Admitting: Internal Medicine

## 2020-10-27 ENCOUNTER — Ambulatory Visit (HOSPITAL_COMMUNITY): Payer: 59 | Admitting: Licensed Clinical Social Worker

## 2020-10-28 ENCOUNTER — Encounter (HOSPITAL_COMMUNITY): Payer: Self-pay | Admitting: Psychiatry

## 2020-10-28 ENCOUNTER — Telehealth (INDEPENDENT_AMBULATORY_CARE_PROVIDER_SITE_OTHER): Payer: 59 | Admitting: Psychiatry

## 2020-10-28 ENCOUNTER — Other Ambulatory Visit: Payer: Self-pay

## 2020-10-28 VITALS — Wt 218.0 lb

## 2020-10-28 DIAGNOSIS — F4321 Adjustment disorder with depressed mood: Secondary | ICD-10-CM

## 2020-10-28 DIAGNOSIS — F331 Major depressive disorder, recurrent, moderate: Secondary | ICD-10-CM | POA: Diagnosis not present

## 2020-10-28 DIAGNOSIS — F419 Anxiety disorder, unspecified: Secondary | ICD-10-CM

## 2020-10-28 MED ORDER — BUPROPION HCL ER (XL) 300 MG PO TB24: 300.0000 mg | ORAL_TABLET | Freq: Every day | ORAL | 1 refills | Status: DC

## 2020-10-28 MED ORDER — ARIPIPRAZOLE 5 MG PO TABS: 5.0000 mg | ORAL_TABLET | Freq: Every day | ORAL | 1 refills | Status: DC

## 2020-10-28 NOTE — Progress Notes (Addendum)
Virtual Visit via Telephone Note  I connected with [name] on 10/28/20 at  3:20 PM EDT by telephone and verified that I am speaking with the correct person using two identifiers.  Location: Patient: Home Provider: Home Office   I discussed the limitations, risks, security and privacy concerns of performing an evaluation and management service by telephone and the availability of in person appointments. I also discussed with the patient that there may be a patient responsible charge related to this service. The patient expressed understanding and agreed to proceed.   History of Present Illness: Patient is evaluated by phone session.  He is very sad because his wife died in 06-Nov-2022.  His wife has a lot of health issues and he is very sad about it.  He had contact hospice care for grief counseling but has not started yet.  He also had appointment with a therapist in our office on July 27.  He admitted days are good but nights are very difficult and sometimes he does not have appetite and he do not sleep better.  He is taking trazodone 100 mg prescribed by his PCP.  He admitted lack of energy, motivation to do things but denies any hallucination, paranoia, suicidal thoughts or any anhedonia.  He wants to live but he is going through grief and he understands it will take some time.  He has a good supportive friends and family.  He also trying to lose weight and he does not want to take any medicine that cause weight gain.  He denies drinking or using any illegal substances.     Past Psychiatric History: Reviewed. H/O depression and anxiety.  No h/o suicidal attempt or inpatient treatment.  Seen Dr. Jake Michaelis and given Zoloft, Klonopin and Cymbalta.  Later PCP added BuSpar and trazodone.  We tried Lamictal but did not help.  Psychiatric Specialty Exam: Physical Exam  Review of Systems  Weight 218 lb (98.9 kg).There is no height or weight on file to calculate BMI.  General Appearance: NA  Eye  Contact:  NA  Speech:  Slow  Volume:  Decreased  Mood:  Dysphoric  Affect:  NA  Thought Process:  Goal Directed  Orientation:  Full (Time, Place, and Person)  Thought Content:  Rumination  Suicidal Thoughts:  No  Homicidal Thoughts:  No  Memory:  Immediate;   Good Recent;   Fair Remote;   Fair  Judgement:  Fair  Insight:  Present  Psychomotor Activity:  NA  Concentration:  Concentration: Fair and Attention Span: Fair  Recall:  [employer] of Knowledge:  Good  Language:  Good  Akathisia:  No  Handed:  Right  AIMS (if indicated):     Assets:  Communication Skills Desire for Improvement Housing Social Support  ADL's:  Intact  Cognition:  WNL  Sleep:   poor      Assessment and Plan: Major depressive disorder, recurrent.  Anxiety.  Grief.  Discussed loss of wife who had chronically illness and multiple health issues.  Patient going through grief.  Encourage to contact hospice for grief counseling and patient also had appointment on July 27 to start therapy.  Currently he is taking trazodone 100 mg even though prescribed 150 and I recommend to try full dose of trazodone given by PCP to help his sleep.  He does not want any medication changes.  He lost weight but he is trying to lose weight and does not want mirtazapine which I offer to help  sleep, anxiety and increased appetite.  Like to keep the Wellbutrin and Abilify as he is also taking Klonopin, BuSpar, gabapentin from PCP.  Discussed safety concern that anytime he feels worsening of symptoms then he should call us immediately.  We will follow up in 6 weeks.  Follow Up Instructions:    I discussed the assessment and treatment plan with the patient. The patient was provided an opportunity to ask questions and all were answered. The patient agreed with the plan and demonstrated an understanding of the instructions.   The patient was advised to call back or seek an in-person evaluation if the symptoms worsen or if the condition  fails to improve as anticipated.  I provided 18 minutes of non-face-to-face time during this encounter.   Kathlee Nations, MD

## 2020-11-08 ENCOUNTER — Other Ambulatory Visit: Payer: Self-pay | Admitting: Internal Medicine

## 2020-11-08 ENCOUNTER — Other Ambulatory Visit (HOSPITAL_COMMUNITY): Payer: 59

## 2020-11-08 DIAGNOSIS — F331 Major depressive disorder, recurrent, moderate: Secondary | ICD-10-CM

## 2020-11-08 DIAGNOSIS — F411 Generalized anxiety disorder: Secondary | ICD-10-CM

## 2020-11-09 ENCOUNTER — Telehealth: Payer: Self-pay | Admitting: Internal Medicine

## 2020-11-09 NOTE — Telephone Encounter (Signed)
Patient called in and stated his wife passed on 2022/11/14 and he would like his medication adjusted and to see if he can come in sooner for an appointment.There are no sooner openings,please advise.

## 2020-11-09 NOTE — Telephone Encounter (Signed)
Spoke with pt and moved his appt up to 11/16/2020, the first available. Pt is wanting to discuss increasing his medication since his wife passed away. While on the phone with pt he sounded as if he were crying. Wasn't sure if you had somewhere you would like to work the pt in sooner than 11/16/2020.

## 2020-11-10 NOTE — Telephone Encounter (Signed)
Spoke with pt and moved his appt up to 11/11/2020 at 1:30 per verbal from Dr. Derrel Nip.

## 2020-11-11 ENCOUNTER — Telehealth (INDEPENDENT_AMBULATORY_CARE_PROVIDER_SITE_OTHER): Payer: 59 | Admitting: Internal Medicine

## 2020-11-11 DIAGNOSIS — E1121 Type 2 diabetes mellitus with diabetic nephropathy: Secondary | ICD-10-CM | POA: Diagnosis not present

## 2020-11-11 DIAGNOSIS — Z7189 Other specified counseling: Secondary | ICD-10-CM

## 2020-11-11 MED ORDER — METFORMIN HCL 500 MG PO TABS: 500.0000 mg | ORAL_TABLET | Freq: Two times a day (BID) | ORAL | 3 refills | Status: DC

## 2020-11-11 MED ORDER — CELECOXIB 200 MG PO CAPS: 200.0000 mg | ORAL_CAPSULE | Freq: Every day | ORAL | 1 refills | Status: DC

## 2020-11-14 DIAGNOSIS — Z7189 Other specified counseling: Secondary | ICD-10-CM | POA: Insufficient documentation

## 2020-11-14 NOTE — Assessment & Plan Note (Signed)
Patient is dealing with the unexpected loss of spouse and has adequate coping skills and emotional support .  i have asked patient to return in one month to examine for signs of unresolving grief.  

## 2020-11-14 NOTE — Progress Notes (Addendum)
Telephone Visit Note  This visit type was conducted due to national recommendations for restrictions regarding the COVID-19 pandemic (e.g. social distancing).  This format is felt to be most appropriate for this patient at this time.  All issues noted in this document were discussed and addressed.  No physical exam was performed (except for noted visual exam findings with Video Visits).   I connected withNAME@ on 11/11/20 at  1:30 PM EDT by telephone and verified that I am speaking with the correct person using two identifiers. Location patient: home Location provider: work or home office Persons participating in the virtual visit: patient, provider  I discussed the limitations, risks, security and privacy concerns of performing an evaluation and management service by telephone and the availability of in person appointments. I also discussed with the patient that there may be a patient responsible charge related to this service. The patient expressed understanding and agreed to proceed.  Interactive audio and video telecommunications were attempted between this provider and patient, however failed, due to patient having technical difficulties   We continued and completed visit with audio only.   Reason for visit:   HPI:  presents for grief counselling .  He lost his wife of 30 years recently after succumbing to heart failure.  He has been distraught ,  but has had the emotional support of his son and daughter who live in [place].  He is not sleeping well,  not eating well.  Taking his medications as directed.   Discussed his support network.  He denies excessive use of alcohol.     ROS: Patient denies headache, fevers, malaise, unintentional weight loss, skin rash, eye pain, sinus congestion and sinus pain, sore throat, dysphagia,  hemoptysis , cough, dyspnea, wheezing, chest pain, palpitations, orthopnea, edema, abdominal pain, nausea, melena, diarrhea, constipation, flank pain, dysuria,  hematuria, urinary  Frequency, nocturia, numbness, tingling, seizures,  Focal weakness, Loss of consciousness,  Tremor, and suicidal ideation.     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   Anxiety    Anxiety and depression    Arthritis    knees and shoulders   Benzodiazepine withdrawal with complication ([place]) 10/26/8887   Bruit    L   Chest pain    hx   Cognitive complaints with normal neuropsychological exam 10/23/2016   Diabetes mellitus without complication ([place])    "borderline", diet controlled, no meds, patient has lost 30 lbs   Edema    Fatty liver    GERD (gastroesophageal reflux disease)    uses Omeprazole   Goiter    HLD (hyperlipidemia)    Hypertension    essential, benign   Hypothyroidism    Impotence of organic origin    Murmur    never has caused any problems   Neuromuscular disorder ([place])    Other chest pain    tightness, pressure   Palpitation    hx   Precordial pain    Sleep apnea    uses cpap    Past Surgical History:  Procedure Laterality Date   APPENDECTOMY  2002   done at [facility] N/A 08/27/2015   Procedure: Left Heart Cath and Coronary Angiography;  Surgeon: Jolaine Artist, MD;  Location: [facility];  Service: Cardiovascular;  Laterality: N/A;   COLONOSCOPY  in his 20's   EAR CYST EXCISION Left  07/08/2019   Procedure: open excision baker's cyst left knee;  Surgeon: Meredith Pel, MD;  Location: [facility];  Service: Orthopedics;  Laterality: Left;   HERNIA REPAIR  6-44-03   umbilical   JOINT REPLACEMENT     right knee   KNEE ARTHROSCOPY Left 12/17/2018   Procedure: left knee arthroscopy, meniscal debridement, loose body removal;  Surgeon: Meredith Pel, MD;  Location: [facility];  Service: Orthopedics;  Laterality: Left;   REVISION TOTAL KNEE ARTHROPLASTY Right 06/23/2014   DR DEAN    TONSILLECTOMY     TOTAL KNEE ARTHROPLASTY Right 2011   right   TOTAL KNEE REVISION Right 06/23/2014   Procedure: TOTAL KNEE REVISION;  Surgeon: Meredith Pel, MD;  Location: [facility];  Service: Orthopedics;  Laterality: Right;   UPPER GASTROINTESTINAL ENDOSCOPY  04/30/2013   Judieth Keens TOOTH EXTRACTION      Family History  Problem Relation Age of Onset   Heart disease Father    Colon cancer Neg Hx    Colon polyps Neg Hx    Esophageal cancer Neg Hx    Rectal cancer Neg Hx    Stomach cancer Neg Hx     SOCIAL HX:   reports that he quit smoking about 15 years ago. His smoking use included cigarettes. He has a 36.00 pack-year smoking history. He has never used smokeless tobacco. He reports current alcohol use of about 4.0 standard drinks per week. He reports that he does not use drugs.    Current Outpatient Medications:    ARIPiprazole (ABILIFY) 5 MG tablet, Take 1 tablet (5 mg total) by mouth at bedtime., Disp: 30 tablet, Rfl: 1   aspirin EC 81 MG tablet, Take 1 tablet (81 mg total) by mouth daily. Swallow whole., Disp: 30 tablet, Rfl: 11   atorvastatin (LIPITOR) 80 MG tablet, Take 1 tablet (80 mg total) by mouth daily., Disp: 90 tablet, Rfl: 3   blood glucose meter kit and supplies, Dispense based on patient and insurance preference. Use to check blood sugars once daily., Disp: 1 each, Rfl: 0   buPROPion (WELLBUTRIN XL) 300 MG 24 hr tablet, Take 1 tablet (300 mg total) by mouth daily., Disp: 30 tablet, Rfl: 1   clobetasol (TEMOVATE) 0.05 % external solution, Apply 1 application topically 2 (two) times daily as needed (scalp irritation)., Disp: , Rfl:    Clobetasol Propionate 0.05 % shampoo, Apply to dry scalp and leave on for 15 minutes (Patient taking differently: Apply 1 application topically 2 (two) times a week. Apply to dry scalp and leave on for 15 minutes as needed for itching.), Disp: 118 mL, Rfl: 5   clonazePAM (KLONOPIN) 1 MG tablet, TAKE 1 TABLET BY MOUTH TWICE DAILY  AS NEEDED FOR ANXIETY, Disp: 60 tablet, Rfl: 5   dicyclomine (BENTYL) 20 MG tablet, TAKE 1 TABLET BY MOUTH EVERY 6 HOURS, Disp: 120 tablet, Rfl: 0   EUTHYROX 88 MCG tablet, TAKE 1 TABLET BY MOUTH ONCE DAILY BEFORE BREAKFAST, Disp: 90 tablet, Rfl: 0   furosemide (LASIX) 20 MG tablet, Take 1 tablet (20 mg total) by mouth every other day., Disp: 45 tablet, Rfl: 3   gabapentin (NEURONTIN) 600 MG tablet, TAKE 1 TABLET BY MOUTH THREE TIMES DAILY, Disp: 270 tablet, Rfl: 0   glucose blood test strip, Use once daily to check BS at various times  E11.9, Disp: 100 each, Rfl: 12   Multiple Vitamin (MULTIVITAMIN WITH MINERALS) TABS tablet, Take 1 tablet by mouth daily., Disp: ,  Rfl:    promethazine (PHENERGAN) 12.5 MG tablet, Take 1 tablet (12.5 mg total) by mouth every 4 (four) hours as needed for nausea or vomiting., Disp: 20 tablet, Rfl: 0   SUPER B COMPLEX/C PO, Take 1 tablet by mouth daily., Disp: , Rfl:    traZODone (DESYREL) 100 MG tablet, TAKE 1 & 1/2 (ONE & ONE-HALF) TABLETS BY MOUTH AT BEDTIME, Disp: 135 tablet, Rfl: 0   amLODipine (NORVASC) 5 MG tablet, Take 1 tablet by mouth once daily, Disp: 90 tablet, Rfl: 0   busPIRone (BUSPAR) 15 MG tablet, TAKE 1 TABLET BY MOUTH THREE TIMES DAILY, Disp: 90 tablet, Rfl: 0   busPIRone (BUSPAR) 15 MG tablet, Take 1 tablet (15 mg total) by mouth 3 (three) times daily., Disp: 90 tablet, Rfl: 1   celecoxib (CELEBREX) 200 MG capsule, Take 1 capsule (200 mg total) by mouth daily., Disp: 90 capsule, Rfl: 1   ezetimibe (ZETIA) 10 MG tablet, Take 1 tablet (10 mg total) by mouth daily., Disp: 90 tablet, Rfl: 3   HYDROcodone-acetaminophen (NORCO) 10-325 MG tablet, Take 1 tablet by mouth 2 (two) times daily as needed., Disp: 60 tablet, Rfl: 0   losartan (COZAAR) 100 MG tablet, Take 1 tablet by mouth once daily, Disp: 90 tablet, Rfl: 0   losartan (COZAAR) 100 MG tablet, Take 1 tablet (100 mg total) by mouth daily., Disp: 90 tablet, Rfl: 3   metFORMIN (GLUCOPHAGE) 500 MG  tablet, Take 1 tablet (500 mg total) by mouth 2 (two) times daily with a meal., Disp: 180 tablet, Rfl: 3   methocarbamol (ROBAXIN) 500 MG tablet, TAKE 1 TABLET BY MOUTH EVERY 8 HOURS AS NEEDED, Disp: 90 tablet, Rfl: 0   metoprolol tartrate (LOPRESSOR) 25 MG tablet, Take 1 tablet by mouth twice daily, Disp: 180 tablet, Rfl: 0   omeprazole (PRILOSEC) 40 MG capsule, TAKE 1 CAPSULE BY MOUTH IN THE MORNING AND AT BEDTIME, Disp: 180 capsule, Rfl: 0  EXAM:   General impression: alert, cooperative and articulate.  No signs of being in distress  Lungs: speech is fluent sentence length suggests that patient is not short of breath and not punctuated by cough, sneezing or sniffing. Marland Kitchen   Psych: affect normal.  speech is articulate and non pressured .  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  i have asked patient to return in one month to examine for signs of unresolving grief.    I discussed the assessment and treatment plan with the patient. The patient was provided an opportunity to ask questions and all were answered. The patient agreed with the plan and demonstrated an understanding of the instructions.   The patient was advised to call back or seek an in-person evaluation if the symptoms worsen or if the condition fails to improve as anticipated.  I provided  22  minutes of non-face-to-face time during this encounter reviewing patient's current problems and post surgeries.  Providing grief  counseling , and coordination  of care .   [name], MD

## 2020-11-16 ENCOUNTER — Telehealth: Payer: 59 | Admitting: Internal Medicine

## 2020-11-17 ENCOUNTER — Ambulatory Visit (HOSPITAL_COMMUNITY): Payer: 59 | Admitting: Licensed Clinical Social Worker

## 2020-11-22 ENCOUNTER — Other Ambulatory Visit: Payer: Self-pay | Admitting: Internal Medicine

## 2020-11-23 ENCOUNTER — Ambulatory Visit (INDEPENDENT_AMBULATORY_CARE_PROVIDER_SITE_OTHER): Payer: 59 | Admitting: Licensed Clinical Social Worker

## 2020-11-23 ENCOUNTER — Other Ambulatory Visit: Payer: Self-pay

## 2020-11-23 DIAGNOSIS — F331 Major depressive disorder, recurrent, moderate: Secondary | ICD-10-CM
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
## 2020-12-01 ENCOUNTER — Other Ambulatory Visit: Payer: Self-pay | Admitting: Internal Medicine

## 2020-12-01 ENCOUNTER — Other Ambulatory Visit: Payer: Self-pay

## 2020-12-01 MED ORDER — HYDROCODONE-ACETAMINOPHEN 10-325 MG PO TABS: 1.0000 | ORAL_TABLET | Freq: Two times a day (BID) | ORAL | 0 refills | Status: DC | PRN

## 2020-12-01 NOTE — Telephone Encounter (Signed)
RX Refill:norco Last Seen:11-11-20 Last ordered:11-01-20

## 2020-12-07 ENCOUNTER — Encounter: Payer: Self-pay | Admitting: Internal Medicine

## 2020-12-07 ENCOUNTER — Telehealth (INDEPENDENT_AMBULATORY_CARE_PROVIDER_SITE_OTHER): Payer: 59 | Admitting: Internal Medicine

## 2020-12-07 VITALS — Ht 72.0 in | Wt 224.0 lb

## 2020-12-07 DIAGNOSIS — M5412 Radiculopathy, cervical region: Secondary | ICD-10-CM

## 2020-12-07 DIAGNOSIS — E1169 Type 2 diabetes mellitus with other specified complication: Secondary | ICD-10-CM | POA: Diagnosis not present

## 2020-12-07 DIAGNOSIS — E1121 Type 2 diabetes mellitus with diabetic nephropathy: Secondary | ICD-10-CM

## 2020-12-07 DIAGNOSIS — Z7189 Other specified counseling: Secondary | ICD-10-CM

## 2020-12-07 DIAGNOSIS — E785 Hyperlipidemia, unspecified: Secondary | ICD-10-CM

## 2020-12-07 NOTE — Progress Notes (Deleted)
Virtual Visit via [place] Note  This visit type was conducted due to national recommendations for restrictions regarding the COVID-19 pandemic (e.g. social distancing).  This format is felt to be most appropriate for this patient at this time.  All issues noted in this document were discussed and addressed.  No physical exam was performed (except for noted visual exam findings with Video Visits).   I connected withNAME@ on 12/07/20 at  1:30 PM EDT by a video enabled telemedicine application or telephone and verified that I am speaking with the correct person using two identifiers. Location patient: home Location provider: work or home office Persons participating in the virtual visit: patient, provider  I discussed the limitations, risks, security and privacy concerns of performing an evaluation and management service by telephone and the availability of in person appointments. I also discussed with the patient that there may be a patient responsible charge related to this service. The patient expressed understanding and agreed to proceed.  Reason for visit:  medication refill   HPI:  [name] is a 55 yr old male with chronic  joint pain due to multiple unresolved orthopedic issues who presents for med refill  Lost his wife [name] less than 3 months ago   ROS: See pertinent positives and negatives per HPI.  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   Anxiety    Anxiety and depression    Arthritis    knees and shoulders   Benzodiazepine withdrawal with complication ([facility]) 3/61/4431   Bruit    L   Chest pain    hx   Cognitive complaints with normal neuropsychological exam 10/23/2016   Diabetes mellitus without complication ([facility])    "borderline", diet controlled, no meds, patient has lost 30 lbs   Edema    Fatty liver    GERD (gastroesophageal reflux disease)    uses Omeprazole   Goiter     HLD (hyperlipidemia)    Hypertension    essential, benign   Hypothyroidism    Impotence of organic origin    Murmur    never has caused any problems   Neuromuscular disorder ([facility])    Other chest pain    tightness, pressure   Palpitation    hx   Precordial pain    Sleep apnea    uses cpap    Past Surgical History:  Procedure Laterality Date   APPENDECTOMY  2002   done at [facility] N/A 08/27/2015   Procedure: Left Heart Cath and Coronary Angiography;  Surgeon: Jolaine Artist, MD;  Location: [facility];  Service: Cardiovascular;  Laterality: N/A;   COLONOSCOPY  in his 20's   EAR CYST EXCISION Left 07/08/2019   Procedure: open excision baker's cyst left knee;  Surgeon: Meredith Pel, MD;  Location: [facility];  Service: Orthopedics;  Laterality: Left;   HERNIA REPAIR  5-40-08   umbilical   JOINT REPLACEMENT     right knee   KNEE ARTHROSCOPY Left 12/17/2018   Procedure: left knee arthroscopy, meniscal debridement, loose body removal;  Surgeon: Meredith Pel, MD;  Location: [facility];  Service: Orthopedics;  Laterality: Left;   REVISION TOTAL KNEE ARTHROPLASTY Right 06/23/2014   DR DEAN   TONSILLECTOMY     TOTAL KNEE ARTHROPLASTY Right 2011   right   TOTAL KNEE REVISION Right 06/23/2014   Procedure:  TOTAL KNEE REVISION;  Surgeon: Meredith Pel, MD;  Location: [facility];  Service: Orthopedics;  Laterality: Right;   UPPER GASTROINTESTINAL ENDOSCOPY  04/30/2013   Judieth Keens TOOTH EXTRACTION      Family History  Problem Relation Age of Onset   Heart disease Father    Colon cancer Neg Hx    Colon polyps Neg Hx    Esophageal cancer Neg Hx    Rectal cancer Neg Hx    Stomach cancer Neg Hx     SOCIAL HX: ***   Current Outpatient Medications:    amLODipine (NORVASC) 5 MG tablet, Take 1 tablet by mouth once daily, Disp: 90 tablet, Rfl: 0   ARIPiprazole (ABILIFY) 5 MG tablet, Take 1 tablet (5 mg total) by  mouth at bedtime., Disp: 30 tablet, Rfl: 1   aspirin EC 81 MG tablet, Take 1 tablet (81 mg total) by mouth daily. Swallow whole., Disp: 30 tablet, Rfl: 11   atorvastatin (LIPITOR) 80 MG tablet, Take 1 tablet (80 mg total) by mouth daily., Disp: 90 tablet, Rfl: 3   blood glucose meter kit and supplies, Dispense based on patient and insurance preference. Use to check blood sugars once daily., Disp: 1 each, Rfl: 0   buPROPion (WELLBUTRIN XL) 300 MG 24 hr tablet, Take 1 tablet (300 mg total) by mouth daily., Disp: 30 tablet, Rfl: 1   busPIRone (BUSPAR) 15 MG tablet, TAKE 1 TABLET BY MOUTH THREE TIMES DAILY, Disp: 90 tablet, Rfl: 0   celecoxib (CELEBREX) 200 MG capsule, Take 1 capsule (200 mg total) by mouth daily., Disp: 90 capsule, Rfl: 1   clobetasol (TEMOVATE) 0.05 % external solution, Apply 1 application topically 2 (two) times daily as needed (scalp irritation)., Disp: , Rfl:    Clobetasol Propionate 0.05 % shampoo, Apply to dry scalp and leave on for 15 minutes (Patient taking differently: Apply 1 application topically 2 (two) times a week. Apply to dry scalp and leave on for 15 minutes as needed for itching.), Disp: 118 mL, Rfl: 5   clonazePAM (KLONOPIN) 1 MG tablet, TAKE 1 TABLET BY MOUTH TWICE DAILY AS NEEDED FOR ANXIETY, Disp: 60 tablet, Rfl: 5   dicyclomine (BENTYL) 20 MG tablet, TAKE 1 TABLET BY MOUTH EVERY 6 HOURS, Disp: 120 tablet, Rfl: 0   EUTHYROX 88 MCG tablet, TAKE 1 TABLET BY MOUTH ONCE DAILY BEFORE BREAKFAST, Disp: 90 tablet, Rfl: 0   furosemide (LASIX) 20 MG tablet, Take 1 tablet (20 mg total) by mouth every other day., Disp: 45 tablet, Rfl: 3   gabapentin (NEURONTIN) 600 MG tablet, TAKE 1 TABLET BY MOUTH THREE TIMES DAILY, Disp: 270 tablet, Rfl: 0   glucose blood test strip, Use once daily to check BS at various times  E11.9, Disp: 100 each, Rfl: 12   HYDROcodone-acetaminophen (NORCO) 10-325 MG tablet, Take 1 tablet by mouth 2 (two) times daily as needed., Disp: 60 tablet, Rfl: 0    losartan (COZAAR) 100 MG tablet, Take 1 tablet (100 mg total) by mouth daily., Disp: 90 tablet, Rfl: 3   metFORMIN (GLUCOPHAGE) 500 MG tablet, Take 1 tablet (500 mg total) by mouth 2 (two) times daily with a meal., Disp: 180 tablet, Rfl: 3   methocarbamol (ROBAXIN) 500 MG tablet, TAKE 1 TABLET BY MOUTH EVERY 8 HOURS AS NEEDED, Disp: 90 tablet, Rfl: 0   metoprolol tartrate (LOPRESSOR) 25 MG tablet, Take 1 tablet by mouth twice daily, Disp: 180 tablet, Rfl: 0   Multiple Vitamin (MULTIVITAMIN WITH MINERALS)  TABS tablet, Take 1 tablet by mouth daily., Disp: , Rfl:    omeprazole (PRILOSEC) 40 MG capsule, TAKE 1 CAPSULE BY MOUTH IN THE MORNING AND AT BEDTIME, Disp: 180 capsule, Rfl: 0   promethazine (PHENERGAN) 12.5 MG tablet, Take 1 tablet (12.5 mg total) by mouth every 4 (four) hours as needed for nausea or vomiting., Disp: 20 tablet, Rfl: 0   SUPER B COMPLEX/C PO, Take 1 tablet by mouth daily., Disp: , Rfl:    traZODone (DESYREL) 100 MG tablet, TAKE 1 & 1/2 (ONE & ONE-HALF) TABLETS BY MOUTH AT BEDTIME, Disp: 135 tablet, Rfl: 0  EXAM:  VITALS per patient if applicable:  GENERAL: alert, oriented, appears well and in no acute distress  HEENT: atraumatic, conjunttiva clear, no obvious abnormalities on inspection of external nose and ears  NECK: normal movements of the head and neck  LUNGS: on inspection no signs of respiratory distress, breathing rate appears normal, no obvious gross SOB, gasping or wheezing  CV: no obvious cyanosis  MS: moves all visible extremities without noticeable abnormality  PSYCH/NEURO: pleasant and cooperative, no obvious depression or anxiety, speech and thought processing grossly intact  ASSESSMENT AND PLAN:  Discussed the following assessment and plan:  No diagnosis found.  No problem-specific Assessment & Plan notes found for this encounter.    I discussed the assessment and treatment plan with the patient. The patient was provided an opportunity to ask  questions and all were answered. The patient agreed with the plan and demonstrated an understanding of the instructions.   The patient was advised to call back or seek an in-person evaluation if the symptoms worsen or if the condition fails to improve as anticipated.   I spent 30 minutes dedicated to the care of this patient on the date of this encounter to include pre-visit review of his medical history,  Face-to-face time with the patient , and post visit ordering of testing and therapeutics.    [name], MD

## 2020-12-07 NOTE — Progress Notes (Signed)
Virtual Visit [facility]via Caregility Note  This visit type was conducted due to national recommendations for restrictions regarding the COVID-19 pandemic (e.g. social distancing).  This format is felt to be most appropriate for this patient at this time.  All issues noted in this document were discussed and addressed.  No physical exam was performed (except for noted visual exam findings with Video Visits).   I connected withNAME@ on 12/08/20 at  1:30 PM EDT by a video enabled telemedicine application  and verified that I am speaking with the correct person using two identifiers. Location patient: home Location provider: work or home office Persons participating in the virtual visit: patient, provider  I discussed the limitations, risks, security and privacy concerns of performing an evaluation and management service by telephone and the availability of in person appointments. I also discussed with the patient that there may be a patient responsible charge related to this service. The patient expressed understanding and agreed to proceed.  Reason for visit: follow up    HPI:   Widowed recently.    Has not gone o counselling yet.  Sleeping about 6 hours per night.  Feeling less overwhelmed.  Speech is slightly slurred but he denies use of alcohol .   T2DM:  cbgs 120-130 at 10 am  which could be random or fasting.   Has been eating less.    Enjoys Cooking some meals.    Left knee for meniscal tear surgery postponed due to Tanya's decline , recurrent hospitalizations and eventual death.  Still painful but no longer red or swollen    Chronic depression:  taking wellbutrin, abilify, clonazepam and buspirone prn, trazodone at night    ROS: See pertinent positives and negatives per HPI.  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   Anxiety    Anxiety and depression    Arthritis    knees and shoulders    Benzodiazepine withdrawal with complication ([place]) 9/38/1017   Bruit    L   Chest pain    hx   Cognitive complaints with normal neuropsychological exam 10/23/2016   Diabetes mellitus without complication ([place])    "borderline", diet controlled, no meds, patient has lost 30 lbs   Edema    Fatty liver    GERD (gastroesophageal reflux disease)    uses Omeprazole   Goiter    HLD (hyperlipidemia)    Hypertension    essential, benign   Hypothyroidism    Impotence of organic origin    Murmur    never has caused any problems   Neuromuscular disorder ([place])    Other chest pain    tightness, pressure   Palpitation    hx   Precordial pain    Sleep apnea    uses cpap    Past Surgical History:  Procedure Laterality Date   APPENDECTOMY  2002   done at [facility] N/A 08/27/2015   Procedure: Left Heart Cath and Coronary Angiography;  Surgeon: Jolaine Artist, MD;  Location: [facility];  Service: Cardiovascular;  Laterality: N/A;   COLONOSCOPY  in his 20's   EAR CYST EXCISION Left 07/08/2019   Procedure: open excision baker's cyst left knee;  Surgeon: Meredith Pel, MD;  Location: [facility];  Service: Orthopedics;  Laterality: Left;   HERNIA REPAIR  09-01-23   umbilical   JOINT REPLACEMENT  right knee   KNEE ARTHROSCOPY Left 12/17/2018   Procedure: left knee arthroscopy, meniscal debridement, loose body removal;  Surgeon: Meredith Pel, MD;  Location: [facility];  Service: Orthopedics;  Laterality: Left;   REVISION TOTAL KNEE ARTHROPLASTY Right 06/23/2014   DR DEAN   TONSILLECTOMY     TOTAL KNEE ARTHROPLASTY Right 2011   right   TOTAL KNEE REVISION Right 06/23/2014   Procedure: TOTAL KNEE REVISION;  Surgeon: Meredith Pel, MD;  Location: [facility];  Service: Orthopedics;  Laterality: Right;   UPPER GASTROINTESTINAL ENDOSCOPY  04/30/2013   Judieth Keens TOOTH EXTRACTION      Family History  Problem Relation Age of  Onset   Heart disease Father    Colon cancer Neg Hx    Colon polyps Neg Hx    Esophageal cancer Neg Hx    Rectal cancer Neg Hx    Stomach cancer Neg Hx     SOCIAL HX:  reports that he quit smoking about 15 years ago. His smoking use included cigarettes. He has a 36.00 pack-year smoking history. He has never used smokeless tobacco. He reports current alcohol use of about 4.0 standard drinks per week. He reports that he does not use drugs.    Current Outpatient Medications:    amLODipine (NORVASC) 5 MG tablet, Take 1 tablet by mouth once daily, Disp: 90 tablet, Rfl: 0   ARIPiprazole (ABILIFY) 5 MG tablet, Take 1 tablet (5 mg total) by mouth at bedtime., Disp: 30 tablet, Rfl: 1   aspirin EC 81 MG tablet, Take 1 tablet (81 mg total) by mouth daily. Swallow whole., Disp: 30 tablet, Rfl: 11   atorvastatin (LIPITOR) 80 MG tablet, Take 1 tablet (80 mg total) by mouth daily., Disp: 90 tablet, Rfl: 3   blood glucose meter kit and supplies, Dispense based on patient and insurance preference. Use to check blood sugars once daily., Disp: 1 each, Rfl: 0   buPROPion (WELLBUTRIN XL) 300 MG 24 hr tablet, Take 1 tablet (300 mg total) by mouth daily., Disp: 30 tablet, Rfl: 1   busPIRone (BUSPAR) 15 MG tablet, TAKE 1 TABLET BY MOUTH THREE TIMES DAILY, Disp: 90 tablet, Rfl: 0   celecoxib (CELEBREX) 200 MG capsule, Take 1 capsule (200 mg total) by mouth daily., Disp: 90 capsule, Rfl: 1   clobetasol (TEMOVATE) 0.05 % external solution, Apply 1 application topically 2 (two) times daily as needed (scalp irritation)., Disp: , Rfl:    Clobetasol Propionate 0.05 % shampoo, Apply to dry scalp and leave on for 15 minutes (Patient taking differently: Apply 1 application topically 2 (two) times a week. Apply to dry scalp and leave on for 15 minutes as needed for itching.), Disp: 118 mL, Rfl: 5   clonazePAM (KLONOPIN) 1 MG tablet, TAKE 1 TABLET BY MOUTH TWICE DAILY AS NEEDED FOR ANXIETY, Disp: 60 tablet, Rfl: 5   dicyclomine  (BENTYL) 20 MG tablet, TAKE 1 TABLET BY MOUTH EVERY 6 HOURS, Disp: 120 tablet, Rfl: 0   EUTHYROX 88 MCG tablet, TAKE 1 TABLET BY MOUTH ONCE DAILY BEFORE BREAKFAST, Disp: 90 tablet, Rfl: 0   furosemide (LASIX) 20 MG tablet, Take 1 tablet (20 mg total) by mouth every other day., Disp: 45 tablet, Rfl: 3   gabapentin (NEURONTIN) 600 MG tablet, TAKE 1 TABLET BY MOUTH THREE TIMES DAILY, Disp: 270 tablet, Rfl: 0   glucose blood test strip, Use once daily to check BS at various times  E11.9, Disp: 100 each, Rfl: 12  HYDROcodone-acetaminophen (NORCO) 10-325 MG tablet, Take 1 tablet by mouth 2 (two) times daily as needed., Disp: 60 tablet, Rfl: 0   losartan (COZAAR) 100 MG tablet, Take 1 tablet (100 mg total) by mouth daily., Disp: 90 tablet, Rfl: 3   metFORMIN (GLUCOPHAGE) 500 MG tablet, Take 1 tablet (500 mg total) by mouth 2 (two) times daily with a meal., Disp: 180 tablet, Rfl: 3   methocarbamol (ROBAXIN) 500 MG tablet, TAKE 1 TABLET BY MOUTH EVERY 8 HOURS AS NEEDED, Disp: 90 tablet, Rfl: 0   metoprolol tartrate (LOPRESSOR) 25 MG tablet, Take 1 tablet by mouth twice daily, Disp: 180 tablet, Rfl: 0   Multiple Vitamin (MULTIVITAMIN WITH MINERALS) TABS tablet, Take 1 tablet by mouth daily., Disp: , Rfl:    omeprazole (PRILOSEC) 40 MG capsule, TAKE 1 CAPSULE BY MOUTH IN THE MORNING AND AT BEDTIME, Disp: 180 capsule, Rfl: 0   promethazine (PHENERGAN) 12.5 MG tablet, Take 1 tablet (12.5 mg total) by mouth every 4 (four) hours as needed for nausea or vomiting., Disp: 20 tablet, Rfl: 0   SUPER B COMPLEX/C PO, Take 1 tablet by mouth daily., Disp: , Rfl:    traZODone (DESYREL) 100 MG tablet, TAKE 1 & 1/2 (ONE & ONE-HALF) TABLETS BY MOUTH AT BEDTIME, Disp: 135 tablet, Rfl: 0  EXAM:  VITALS per patient if applicable:  GENERAL: alert, oriented, appears well and in no acute distress  HEENT: atraumatic, conjunttiva clear, no obvious abnormalities on inspection of external nose and ears  NECK: normal movements of  the head and neck  LUNGS: on inspection no signs of respiratory distress, breathing rate appears normal, no obvious gross SOB, gasping or wheezing  CV: no obvious cyanosis  MS: moves all visible extremities without noticeable abnormality  PSYCH/NEURO: pleasant and cooperative, no obvious depression or anxiety, speech and thought processing grossly intact  ASSESSMENT AND PLAN:  Discussed the following assessment and plan:  Hyperlipidemia associated with type 2 diabetes mellitus (HCC) - Plan: Comprehensive metabolic panel, Lipid panel  Grief counseling  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   he has loss of sensation over  several areas of both feet from prior podiatry surgeries,  But his feet are in excellent condition.  Tolerating every other day asa,  ARB  And statin .  Repeat labs are due   Lab Results  Component Value Date   HGBA1C 6.9 (H) 09/02/2020   Lab Results  Component Value Date   MICROALBUR <0.7 05/28/2020   MICROALBUR 47.1 (H) 01/09/2019    Lab Results  Component Value Date   CHOL 148 05/28/2020   HDL 31.20 (L) 05/28/2020   LDLCALC 84 05/28/2020   LDLDIRECT 101.0 10/16/2017   TRIG 165.0 (H) 05/28/2020   CHOLHDL 5 05/28/2020     Radiculopathy, cervical region His pain radiates to left shoulder due to multi level disk disease by August 2020 MRI showing moderate spinal stenosis ct C5-6 level with mild cord flattening and moderate/severe let neural foraminal narrowing . Refill history confirmed via [place] Controlled Substance databas, accessed by me  today..he has not had any ER visits  And has not requested any early  refills.  Refill history was confirmed via [place] Controlled Substance database by me today during  visit and there have been no prescriptions of controlled substances filled from any providers other than me. .  continue Vicodin for pain management    I discussed the assessment and treatment plan with the patient. The patient was provided an opportunity to ask questions and all were answered. The patient agreed with the plan and demonstrated an understanding of the instructions.   The patient was advised to call back or seek an in-person evaluation if the symptoms worsen or if the condition fails to improve as anticipated.   I spent 20 minutes dedicated to the care of this patient on the date of this encounter to include pre-visit review of his medical history,  Face-to-face time with the patient , and post visit ordering of testing and therapeutics.    [name], MD

## 2020-12-08 NOTE — Assessment & Plan Note (Signed)
His pain radiates to left shoulder due to multi level disk disease by August 2020 MRI showing moderate spinal stenosis ct C5-6 level with mild cord flattening and moderate/severe let neural foraminal narrowing . Refill history confirmed via [place] Controlled Substance databas, accessed by me today..he has not had any ER visits  And has not requested any early refills.  Refill history was confirmed via [place] Controlled Substance database by me today during  visit and there have been no prescriptions of controlled substances filled from any providers other than me. .  continue Vicodin for pain management

## 2020-12-08 NOTE — Assessment & Plan Note (Signed)
Diabetes  Has been well controlled ; he is  tolerating metformin.   he has loss of sensation over  several areas of both feet from prior podiatry surgeries,  But his feet are in excellent condition.  Tolerating every other day asa,  ARB  And statin .  Repeat labs are due   Lab Results  Component Value Date   HGBA1C 6.9 (H) 09/02/2020   Lab Results  Component Value Date   MICROALBUR <0.7 05/28/2020   MICROALBUR 47.1 (H) 01/09/2019    Lab Results  Component Value Date   CHOL 148 05/28/2020   HDL 31.20 (L) 05/28/2020   LDLCALC 84 05/28/2020   LDLDIRECT 101.0 10/16/2017   TRIG 165.0 (H) 05/28/2020   CHOLHDL 5 05/28/2020

## 2020-12-08 NOTE — Assessment & Plan Note (Signed)
Patient is dealing with the unexpected loss of spouse and has adequate coping skills and emotional support . I have encouraged him to make grief counselling a priority

## 2020-12-16 ENCOUNTER — Other Ambulatory Visit: Payer: Self-pay

## 2020-12-16 ENCOUNTER — Other Ambulatory Visit (INDEPENDENT_AMBULATORY_CARE_PROVIDER_SITE_OTHER): Payer: 59

## 2020-12-16 DIAGNOSIS — E785 Hyperlipidemia, unspecified: Secondary | ICD-10-CM | POA: Diagnosis not present

## 2020-12-16 DIAGNOSIS — E1169 Type 2 diabetes mellitus with other specified complication: Secondary | ICD-10-CM

## 2020-12-16 DIAGNOSIS — E1121 Type 2 diabetes mellitus with diabetic nephropathy: Secondary | ICD-10-CM

## 2020-12-16 LAB — COMPREHENSIVE METABOLIC PANEL
ALT: 23 U/L (ref 0–53)
AST: 20 U/L (ref 0–37)
Albumin: 4.3 g/dL (ref 3.5–5.2)
Alkaline Phosphatase: 118 U/L — ABNORMAL HIGH (ref 39–117)
BUN: 9 mg/dL (ref 6–23)
CO2: 32 mEq/L (ref 19–32)
Calcium: 9.6 mg/dL (ref 8.4–10.5)
Chloride: 100 mEq/L (ref 96–112)
Creatinine, Ser: 0.98 mg/dL (ref 0.40–1.50)
GFR: 86.44 mL/min (ref >60.00)
Glucose, Bld: 122 mg/dL — ABNORMAL HIGH (ref 70–99)
Potassium: 3.9 mEq/L (ref 3.5–5.1)
Sodium: 139 mEq/L (ref 135–145)
Total Bilirubin: 0.5 mg/dL (ref 0.2–1.2)
Total Protein: 6.5 g/dL (ref 6.0–8.3)

## 2020-12-16 LAB — LIPID PANEL
Cholesterol: 152 mg/dL (ref 0–200)
HDL: 32.9 mg/dL — ABNORMAL LOW (ref >39.00)
NonHDL: 119.21
Total CHOL/HDL Ratio: 5
Triglycerides: 215 mg/dL — ABNORMAL HIGH (ref 0.0–149.0)
VLDL: 43 mg/dL — ABNORMAL HIGH (ref 0.0–40.0)

## 2020-12-16 LAB — HEMOGLOBIN A1C: Hgb A1c MFr Bld: 6.6 % — ABNORMAL HIGH (ref 4.6–6.5)

## 2020-12-16 LAB — LDL CHOLESTEROL, DIRECT: Direct LDL: 99 mg/dL

## 2020-12-19 DIAGNOSIS — F411 Generalized anxiety disorder: Secondary | ICD-10-CM

## 2020-12-19 DIAGNOSIS — E785 Hyperlipidemia, unspecified: Secondary | ICD-10-CM

## 2020-12-19 DIAGNOSIS — E1169 Type 2 diabetes mellitus with other specified complication: Secondary | ICD-10-CM

## 2020-12-19 DIAGNOSIS — E034 Atrophy of thyroid (acquired): Secondary | ICD-10-CM

## 2020-12-19 DIAGNOSIS — E1140 Type 2 diabetes mellitus with diabetic neuropathy, unspecified: Secondary | ICD-10-CM

## 2020-12-19 DIAGNOSIS — F331 Major depressive disorder, recurrent, moderate: Secondary | ICD-10-CM

## 2020-12-20 ENCOUNTER — Other Ambulatory Visit: Payer: Self-pay | Admitting: Internal Medicine

## 2020-12-20 DIAGNOSIS — F411 Generalized anxiety disorder: Secondary | ICD-10-CM

## 2020-12-20 DIAGNOSIS — F331 Major depressive disorder, recurrent, moderate: Secondary | ICD-10-CM

## 2020-12-20 MED ORDER — EZETIMIBE 10 MG PO TABS: 10.0000 mg | ORAL_TABLET | Freq: Every day | ORAL | 3 refills | Status: DC

## 2020-12-20 NOTE — Assessment & Plan Note (Signed)
Not at goal on max dose statin.  addding Zetia  Lab Results  Component Value Date   CHOL 152 12/16/2020   HDL 32.90 (L) 12/16/2020   LDLCALC 84 05/28/2020   LDLDIRECT 99.0 12/16/2020   TRIG 215.0 (H) 12/16/2020   CHOLHDL 5 12/16/2020

## 2020-12-22 MED ORDER — BUSPIRONE HCL 15 MG PO TABS: 15.0000 mg | ORAL_TABLET | Freq: Three times a day (TID) | ORAL | 1 refills | Status: DC

## 2020-12-22 MED ORDER — LOSARTAN POTASSIUM 100 MG PO TABS: 100.0000 mg | ORAL_TABLET | Freq: Every day | ORAL | 3 refills | Status: DC

## 2020-12-22 NOTE — Addendum Note (Signed)
Addended by: Adair Laundry on: 12/22/2020 09:44 AM   Modules accepted: Orders

## 2020-12-22 NOTE — Addendum Note (Signed)
Addended by: [name] on: 12/22/2020 10:07 PM   Modules accepted: Level of Service

## 2020-12-25 ENCOUNTER — Other Ambulatory Visit (HOSPITAL_COMMUNITY): Payer: Self-pay | Admitting: Psychiatry

## 2020-12-25 ENCOUNTER — Other Ambulatory Visit: Payer: Self-pay | Admitting: Internal Medicine

## 2020-12-25 DIAGNOSIS — F331 Major depressive disorder, recurrent, moderate: Secondary | ICD-10-CM

## 2020-12-25 DIAGNOSIS — F419 Anxiety disorder, unspecified: Secondary | ICD-10-CM

## 2020-12-31 ENCOUNTER — Encounter (HOSPITAL_COMMUNITY): Payer: Self-pay | Admitting: Psychiatry

## 2020-12-31 ENCOUNTER — Other Ambulatory Visit: Payer: Self-pay

## 2020-12-31 ENCOUNTER — Telehealth (INDEPENDENT_AMBULATORY_CARE_PROVIDER_SITE_OTHER): Payer: Self-pay | Admitting: Psychiatry

## 2020-12-31 VITALS — Wt 218.0 lb

## 2020-12-31 DIAGNOSIS — F4321 Adjustment disorder with depressed mood: Secondary | ICD-10-CM

## 2020-12-31 DIAGNOSIS — F331 Major depressive disorder, recurrent, moderate: Secondary | ICD-10-CM

## 2020-12-31 DIAGNOSIS — F419 Anxiety disorder, unspecified: Secondary | ICD-10-CM

## 2020-12-31 MED ORDER — BUSPIRONE HCL 30 MG PO TABS: 30.0000 mg | ORAL_TABLET | Freq: Two times a day (BID) | ORAL | 1 refills | Status: DC

## 2020-12-31 MED ORDER — BUPROPION HCL ER (XL) 300 MG PO TB24: 300.0000 mg | ORAL_TABLET | Freq: Every day | ORAL | 1 refills | Status: DC

## 2020-12-31 MED ORDER — ARIPIPRAZOLE 5 MG PO TABS: 5.0000 mg | ORAL_TABLET | Freq: Every day | ORAL | 1 refills | Status: DC

## 2020-12-31 NOTE — Progress Notes (Signed)
Virtual Visit via Telephone Note  I connected with [name] on 12/31/20 at  9:00 AM EDT by telephone and verified that I am speaking with the correct person using two identifiers.  Location: Patient: Home Provider: Home Office   I discussed the limitations, risks, security and privacy concerns of performing an evaluation and management service by telephone and the availability of in person appointments. I also discussed with the patient that there may be a patient responsible charge related to this service. The patient expressed understanding and agreed to proceed.   History of Present Illness: Patient is evaluated by phone session.  He is still struggling with the loss of his wife and have some time crying spells and lack of energy.  His wife died Nov 12, 2022 due to chronic illness.  He started therapy for grief through hospice once a month.  We have recommended increase trazodone which is prescribed by his PCP.  His sleep is better and he is getting few hours of sleep but during the day he ruminates a lot.  He denies any suicidal thoughts or any paranoia or hallucination but feels tired and lack of motivation to do things.  He feels very anxious because he had to adjust the new life.  He has family but they live in [place] with his other and a half where he lives.  He is trying to move there and hoping to have his place close to his parents, brother and nephews.  Recently he had a blood work with his PCP.  His cholesterol remains high.  His hemoglobin A1c is 6.6 which is improved from the past.  His appetite is okay but he believes he had lost few pounds.  He has a scale at home shows a different way from his PCP office.  He admitted that he needs to keep himself busy during the day as before he was taking care of his wife.  He has no tremors, shakes or any EPS.  He takes Klonopin and BuSpar along with trazodone from PCP.  He is also compliant with Wellbutrin and Abilify.  He has no tremor or  shakes or any EPS.  Past Psychiatric History: Reviewed. H/O depression and anxiety.  No h/o suicidal attempt or inpatient treatment.  Seen Dr. Jake Michaelis and given Zoloft, Klonopin and Cymbalta.  Later PCP added BuSpar and trazodone.  We tried Lamictal but did not help.  Recent Results (from the past 2160 hour(s))  ECHOCARDIOGRAM COMPLETE     Status: None   Collection Time: 10/04/20  2:51 PM  Result Value Ref Range   Area-P 1/2 3.82 cm2   S' Lateral 3.10 cm   P 1/2 time 497 msec  Lipid panel     Status: Abnormal   Collection Time: 12/16/20  8:45 AM  Result Value Ref Range   Cholesterol 152 0 - 200 mg/dL    Comment: ATP III Classification       Desirable:  < 200 mg/dL               Borderline High:  200 - 239 mg/dL          High:  > = 240 mg/dL   Triglycerides 215.0 (H) 0.0 - 149.0 mg/dL    Comment: Normal:  <150 mg/dLBorderline High:  150 - 199 mg/dL   HDL 32.90 (L) >39.00 mg/dL   VLDL 43.0 (H) 0.0 - 40.0 mg/dL   Total CHOL/HDL Ratio 5     Comment:  Men          Women1/2 Average Risk     3.4          3.3Average Risk          5.0          4.42X Average Risk          9.6          7.13X Average Risk          15.0          11.0                       NonHDL 119.21     Comment: NOTE:  Non-HDL goal should be 30 mg/dL higher than patient's LDL goal (i.e. LDL goal of < 70 mg/dL, would have non-HDL goal of < 100 mg/dL)  Comprehensive metabolic panel     Status: Abnormal   Collection Time: 12/16/20  8:45 AM  Result Value Ref Range   Sodium 139 135 - 145 mEq/L   Potassium 3.9 3.5 - 5.1 mEq/L   Chloride 100 96 - 112 mEq/L   CO2 32 19 - 32 mEq/L   Glucose, Bld 122 (H) 70 - 99 mg/dL   BUN 9 6 - 23 mg/dL   Creatinine, Ser 0.98 0.40 - 1.50 mg/dL   Total Bilirubin 0.5 0.2 - 1.2 mg/dL   Alkaline Phosphatase 118 (H) 39 - 117 U/L   AST 20 0 - 37 U/L   ALT 23 0 - 53 U/L   Total Protein 6.5 6.0 - 8.3 g/dL   Albumin 4.3 3.5 - 5.2 g/dL   GFR 86.44 >60.00 mL/min    Comment: Calculated using  the CKD-EPI Creatinine Equation (2021)   Calcium 9.6 8.4 - 10.5 mg/dL  Hemoglobin A1c     Status: Abnormal   Collection Time: 12/16/20  8:45 AM  Result Value Ref Range   Hgb A1c MFr Bld 6.6 (H) 4.6 - 6.5 %    Comment: Glycemic Control Guidelines for People with Diabetes:Non Diabetic:  <6%Goal of Therapy: <7%Additional Action Suggested:  >8%   LDL cholesterol, direct     Status: None   Collection Time: 12/16/20  8:45 AM  Result Value Ref Range   Direct LDL 99.0 mg/dL    Comment: Optimal:  <100 mg/dLNear or Above Optimal:  100-129 mg/dLBorderline High:  130-159 mg/dLHigh:  160-189 mg/dLVery High:  >190 mg/dL      Psychiatric Specialty Exam: Physical Exam  Review of Systems  Weight 218 lb (98.9 kg).There is no height or weight on file to calculate BMI.  General Appearance: NA  Eye Contact:  NA  Speech:  Slow  Volume:  Decreased  Mood:  Depressed and Dysphoric  Affect:  NA  Thought Process:  Goal Directed  Orientation:  Full (Time, Place, and Person)  Thought Content:  Rumination  Suicidal Thoughts:  No  Homicidal Thoughts:  No  Memory:  Immediate;   Good Recent;   Good Remote;   Good  Judgement:  Fair  Insight:  Present  Psychomotor Activity:  NA  Concentration:  Concentration: Fair and Attention Span: Fair  Recall:  Good  Fund of Knowledge:  Good  Language:  Good  Akathisia:  No  Handed:  Right  AIMS (if indicated):     Assets:  Communication Skills Desire for Improvement Housing Transportation  ADL's:  Intact  Cognition:  WNL  Sleep:   6 hrs  Assessment and Plan: Major depressive disorder, recurrent.  Anxiety.  Grief.  I reviewed blood work results.  Discussed grief process and encouraged to continue therapy with hospice however recommend if possible may see every 2 weeks.  Patient still feels anxious.  He is taking buspirone 15 mg 3 times a day from PCP.  I recommend trying 30 mg twice a day to 60 mg a day to help his anxiety.  We will continue Wellbutrin  XL 300 mg daily and Abilify 5 mg daily.  His hemoglobin A1c is stable.  He will continue trazodone 150 mg at bedtime, Klonopin 1 mg twice a day from his PCP.  Discuss if patient continues to feel sad depressed and anxious then we may consider switching his medication however explained about the grief process it takes time.  I also recommend to call us back if he feels worsening of symptoms.  Patient is hoping to move to [facility] close to his family.  Follow-up in 2 months.  Follow Up Instructions:    I discussed the assessment and treatment plan with the patient. The patient was provided an opportunity to ask questions and all were answered. The patient agreed with the plan and demonstrated an understanding of the instructions.   The patient was advised to call back or seek an in-person evaluation if the symptoms worsen or if the condition fails to improve as anticipated.  I provided 20 minutes of non-face-to-face time during this encounter.   Kathlee Nations, MD

## 2021-01-06 ENCOUNTER — Other Ambulatory Visit: Payer: Self-pay

## 2021-01-07 ENCOUNTER — Other Ambulatory Visit: Payer: Self-pay | Admitting: Internal Medicine

## 2021-01-07 MED ORDER — HYDROCODONE-ACETAMINOPHEN 10-325 MG PO TABS: 1.0000 | ORAL_TABLET | Freq: Two times a day (BID) | ORAL | 0 refills | Status: DC | PRN

## 2021-01-07 MED ORDER — METHOCARBAMOL 500 MG PO TABS: 500.0000 mg | ORAL_TABLET | Freq: Three times a day (TID) | ORAL | 0 refills | Status: DC | PRN

## 2021-01-07 NOTE — Telephone Encounter (Signed)
[id number] Refill:robaxin and norco Last Seen:12-07-20 Last ordered:12-01-20

## 2021-01-10 ENCOUNTER — Ambulatory Visit (INDEPENDENT_AMBULATORY_CARE_PROVIDER_SITE_OTHER): Payer: Medicare HMO

## 2021-01-10 ENCOUNTER — Other Ambulatory Visit: Payer: Self-pay

## 2021-01-10 ENCOUNTER — Ambulatory Visit (INDEPENDENT_AMBULATORY_CARE_PROVIDER_SITE_OTHER): Payer: Medicare HMO | Admitting: Orthopedic Surgery

## 2021-01-10 VITALS — Ht 70.5 in | Wt 233.2 lb

## 2021-01-10 DIAGNOSIS — M79605 Pain in left leg: Secondary | ICD-10-CM

## 2021-01-12 ENCOUNTER — Encounter: Payer: Self-pay | Admitting: Orthopedic Surgery

## 2021-01-12 NOTE — Progress Notes (Signed)
Office Visit Note   Patient: [name]           Date of Birth: 1965-06-02           MRN: [id number] Visit Date: 01/10/2021 Requested by: [name], MD [address] [place],  [place] [address] PCP: [name], MD  Subjective: Chief Complaint  Patient presents with   Left Knee - Pain    HPI: [name] is a 55 year old patient with severe left knee pain.  He reports pain which wakes him up from sleep at night as well as pain which interferes with his activities of daily living.  Reports rest pain as well.  Has significantly diminished standing and walking endurance due to the left knee.  His wife recently died in 10-28-22.  He has failed all measures of conservative treatment including injections of cortisone and gel shot.  Also strengthening exercises and activity modification.  He is tried sleeves before without any help.  His symptoms have worsened and progressed over the past year and a half.              ROS: All systems reviewed are negative as they relate to the chief complaint within the history of present illness.  Patient denies  fevers or chills.   Assessment & Plan: Visit Diagnoses:  1. Pain in left leg     Plan: Impression is end-stage left knee arthritis severely progressed since prior radiographs.  He has bone-on-bone changes in both the medial and patellofemoral compartments.  Spurring is present.  Discussed operative and nonoperative treatment options with him including continuation with symptoms versus total knee replacement.  He has had total knee replacement on the right revised as well and has done well with that.  Total knee replacement on the left is discussed.  The risk and benefits include not limited to infection nerve vessel damage incomplete pain relief knee stiffness as well as potential for revision in his lifetime are also discussed.  For his young age and bone quality I would definitely plan on press-fit prosthesis.  Patient understands  risk and benefits and wishes to proceed.  All questions answered  Follow-Up Instructions: No follow-ups on file.   Orders:  Orders Placed This Encounter  Procedures   XR KNEE 3 VIEW LEFT   No orders of the defined types were placed in this encounter.     Procedures: No procedures performed   Clinical Data: No additional findings.  Objective: Vital Signs: Ht 5' 10.5" (1.791 m)   Wt 233 lb 3.2 oz (105.8 kg)   BMI 32.99 kg/m   Physical Exam:   Constitutional: Patient appears well-developed HEENT:  Head: Normocephalic Eyes:EOM are normal Neck: Normal range of motion Cardiovascular: Normal rate Pulmonary/chest: Effort normal Neurologic: Patient is alert Skin: Skin is warm Psychiatric: Patient has normal mood and affect   Ortho Exam: Ortho exam demonstrates trace effusion in the left knee.  Range of motion is 5 to 105 degrees.  Collaterals are stable.  Extensor mechanism is intact.  Pedal pulses palpable.  Has medial greater than lateral joint line tenderness with significant patellofemoral crepitus.  No groin pain on the left with internal and external rotation of the leg.  Ankle dorsiflexion intact.  Specialty Comments:  No specialty comments available.  Imaging: No results found.   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 ([facility]) 08/26/2019   Radiculopathy, cervical region 06/10/2019   Carpal tunnel syndrome, left  upper limb 06/10/2019   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   Spinal stenosis of cervical region 12/31/2018   Loose body of left knee    Plantar fasciitis of left foot 08/24/2017   Chronic pain of both hips 06/20/2017   Complete rotator cuff tear of left shoulder 09/14/2015   Hypothyroidism due to acquired atrophy of thyroid 09/14/2015   Arthritis of knee 06/23/2014   Encounter for preventive health examination 06/17/2014   Enlarged RV (right ventricle) 06/17/2014   GERD (gastroesophageal reflux disease) 04/20/2014   Cervical spine degeneration 02/17/2014   Overweight 10/29/2013   Chronic knee pain 10/29/2013   Type 2 diabetes mellitus with diabetic neuropathy, unspecified ([place]) 10/23/2012   Seborrheic dermatitis, unspecified 05/17/2012   Fatty liver 04/22/2012   Hypogonadism male 03/12/2012   Chronic pain of multiple joints 12/24/2010   Sciatica of left side associated with disorder of lumbar spine 12/24/2010   Hyperlipidemia associated with type 2 diabetes mellitus ([place]) 01/13/2009   Anxiety state 01/13/2009   Essential hypertension 01/13/2009   OSA (obstructive sleep apnea) 01/13/2009   Past Medical History:  Diagnosis Date   Acute medial meniscus tear of left knee    Alcohol-induced chronic pancreatitis ([facility]) 01/09/2019   Anxiety    Anxiety and depression    Arthritis    knees and shoulders   Benzodiazepine withdrawal with complication ([facility]) 5/97/4163   Bruit    L   Chest pain    hx   Cognitive complaints with normal neuropsychological exam 10/23/2016   Diabetes mellitus without complication ([facility])    "borderline", diet controlled, no meds, patient has lost 30 lbs   Edema    Fatty liver    GERD (gastroesophageal reflux disease)    uses Omeprazole   Goiter    HLD (hyperlipidemia)    Hypertension    essential, benign   Hypothyroidism    Impotence of organic origin    Murmur    never has caused any problems   Neuromuscular disorder ([facility])    Other chest pain     tightness, pressure   Palpitation    hx   Precordial pain    Sleep apnea    uses cpap    Family History  Problem Relation Age of Onset   Heart disease Father    Colon cancer Neg Hx    Colon polyps Neg Hx    Esophageal cancer Neg Hx    Rectal cancer Neg Hx    Stomach cancer Neg Hx     Past Surgical History:  Procedure Laterality Date   APPENDECTOMY  2002   done at [facility] N/A 08/27/2015   Procedure: Left Heart Cath and Coronary Angiography;  Surgeon: Jolaine Artist, MD;  Location: [facility];  Service: Cardiovascular;  Laterality: N/A;   COLONOSCOPY  in his 20's   EAR CYST EXCISION Left 07/08/2019   Procedure: open excision baker's cyst left knee;  Surgeon: Meredith Pel, MD;  Location: [facility];  Service: Orthopedics;  Laterality: Left;   HERNIA REPAIR  2-37-62   umbilical   JOINT REPLACEMENT     right knee   KNEE ARTHROSCOPY Left 12/17/2018   Procedure: left knee arthroscopy, meniscal debridement, loose body removal;  Surgeon: Meredith Pel, MD;  Location: [facility];  Service: Orthopedics;  Laterality: Left;   REVISION TOTAL KNEE ARTHROPLASTY Right 06/23/2014   DR Asma Boldon   TONSILLECTOMY     TOTAL KNEE ARTHROPLASTY Right 2011   right   TOTAL KNEE REVISION Right 06/23/2014   Procedure: TOTAL KNEE REVISION;  Surgeon: Meredith Pel, MD;  Location: [facility];  Service: Orthopedics;  Laterality: Right;   UPPER GASTROINTESTINAL ENDOSCOPY  04/30/2013   Judieth Keens TOOTH EXTRACTION     Social History   Occupational History   Occupation: Personnel officer: Public relations account executive   Occupation: SERVICE DIRECTOR    Employer: [employer]  Tobacco Use   Smoking status: Former    Packs/day: 2.00    Years: 18.00    Pack years: 36.00    Types: Cigarettes    Quit date: 04/24/2005    Years since quitting: 15.7   Smokeless tobacco: Never   Tobacco comments:    quit 20 years ago    Vaping Use   Vaping Use: Never used  Substance and Sexual Activity   Alcohol use: Yes    Alcohol/week: 4.0 standard drinks    Types: 4 Cans of beer per week   Drug use: No   Sexual activity: Yes

## 2021-01-13 ENCOUNTER — Telehealth: Payer: Self-pay

## 2021-01-13 NOTE — Telephone Encounter (Signed)
PA for hydrocodone has been approved through 04/23/2021.

## 2021-01-24 ENCOUNTER — Other Ambulatory Visit: Payer: Self-pay | Admitting: Internal Medicine

## 2021-01-27 ENCOUNTER — Other Ambulatory Visit: Payer: Self-pay

## 2021-02-04 NOTE — Progress Notes (Addendum)
Surgical Instructions    Your procedure is scheduled on Thursday, October 20th, 2022.   Report to [facility] Main Entrance "A" at 11:50 A.M., then check in with the Admitting office.  Call this number if you have problems the morning of surgery:  [phone]   If you have any questions prior to your surgery date call [phone]: Open Monday-Friday 8am-4pm    Remember:  Do not eat after midnight the night before your surgery  You may drink clear liquids until 10:50 the morning of your surgery.   Clear liquids allowed are: Water, Non-Citrus Juices (without pulp), Carbonated Beverages, Clear Tea, Black Coffee ONLY (NO MILK, CREAM OR POWDERED CREAMER of any kind), and Gatorade  Patient Instructions  The day of surgery (if you have diabetes): Drink ONE (1) 12 oz G2 given to you in your pre admission testing appointment by 10:50 the morning of surgery. Drink in one sitting. Do not sip.  This drink was given to you during your hospital  pre-op appointment visit.  Nothing else to drink after completing the  12 oz bottle of G2.         If you have questions, please contact your surgeon's office.     Take these medicines the morning of surgery with A SIP OF WATER:  amLODipine (NORVASC) atorvastatin (LIPITOR) buPROPion (WELLBUTRIN XL) busPIRone (BUSPAR)  clonazePAM (KLONOPIN) dicyclomine (BENTYL)  ezetimibe (ZETIA)  gabapentin (NEURONTIN) levothyroxine (SYNTHROID)  omeprazole (PRILOSEC)  If needed:  HYDROcodone-acetaminophen (NORCO)  methocarbamol (ROBAXIN)  promethazine (PHENERGAN)   Follow your surgeon's instructions on when to stop Aspirin.  If no instructions were given by your surgeon then you will need to call the office to get those instructions.     As of today, STOP taking any Aspirin (unless otherwise instructed by your surgeon) Aleve, Naproxen, Ibuprofen, Motrin, Advil, Goody's, BC's, all herbal medications, fish oil, and all vitamins.   WHAT DO I DO ABOUT MY  DIABETES MEDICATION?   Do not take metFORMIN (GLUCOPHAGE) the morning of surgery.   HOW TO MANAGE YOUR DIABETES BEFORE AND AFTER SURGERY  Why is it important to control my blood sugar before and after surgery? Improving blood sugar levels before and after surgery helps healing and can limit problems. A way of improving blood sugar control is eating a healthy diet by:  Eating less sugar and carbohydrates  Increasing activity/exercise  Talking with your doctor about reaching your blood sugar goals High blood sugars (greater than 180 mg/dL) can raise your risk of infections and slow your recovery, so you will need to focus on controlling your diabetes during the weeks before surgery. Make sure that the doctor who takes care of your diabetes knows about your planned surgery including the date and location.  How do I manage my blood sugar before surgery? Check your blood sugar at least 4 times a day, starting 2 days before surgery, to make sure that the level is not too high or low.  Check your blood sugar the morning of your surgery when you wake up and every 2 hours until you get to the Short Stay unit.  If your blood sugar is less than 70 mg/dL, you will need to treat for low blood sugar: Do not take insulin. Treat a low blood sugar (less than 70 mg/dL) with  cup of clear juice (cranberry or apple), 4 glucose tablets, OR glucose gel. Recheck blood sugar in 15 minutes after treatment (to make sure it is greater than 70 mg/dL). If your  blood sugar is not greater than 70 mg/dL on recheck, call [phone] for further instructions. Report your blood sugar to the short stay nurse when you get to Short Stay.  If you are admitted to the hospital after surgery: Your blood sugar will be checked by the staff and you will probably be given insulin after surgery (instead of oral diabetes medicines) to make sure you have good blood sugar levels. The goal for blood sugar control after surgery is  80-180 mg/dL.      After your COVID test   You are not required to quarantine however you are required to wear a well-fitting mask when you are out and around people not in your household.  If your mask becomes wet or soiled, replace with a new one.  Wash your hands often with soap and water for 20 seconds or clean your hands with an alcohol-based hand sanitizer that contains at least 60% alcohol.  Do not share personal items.  Notify your provider: if you are in close contact with someone who has COVID  or if you develop a fever of 100.4 or greater, sneezing, cough, sore throat, shortness of breath or body aches.             Do not wear jewelry or makeup Do not wear lotions, powders, perfumes/colognes, or deodorant. Do not shave 48 hours prior to surgery.  Men may shave face and neck. Do not bring valuables to the hospital. DO Not wear nail polish, gel polish, artificial nails, or any other type of covering on natural nails including finger and toenails. If patients have artificial nails, gel coating, etc. that need to be removed by a nail salon, please have this removed prior to surgery or surgery may need to be canceled/delayed if the surgeon/ anesthesia feels like the patient is unable to be adequately monitored.             [facility] is not responsible for any belongings or valuables.  Do NOT Smoke (Tobacco/Vaping)  24 hours prior to your procedure  If you use a CPAP at night, you may bring your mask for your overnight stay.   Contacts, glasses, hearing aids, dentures or partials may not be worn into surgery, please bring cases for these belongings   For patients admitted to the hospital, discharge time will be determined by your treatment team.   Patients discharged the day of surgery will not be allowed to drive home, and someone needs to stay with them for 24 hours.  NO VISITORS WILL BE ALLOWED IN PRE-OP WHERE PATIENTS ARE PREPPED FOR SURGERY.  ONLY 1 SUPPORT PERSON  MAY BE PRESENT IN THE WAITING ROOM WHILE YOU ARE IN SURGERY.  IF YOU ARE TO BE ADMITTED, ONCE YOU ARE IN YOUR ROOM YOU WILL BE ALLOWED TWO (2) VISITORS. 1 (ONE) VISITOR MAY STAY OVERNIGHT BUT MUST ARRIVE TO THE ROOM BY 8pm.  Minor children may have two parents present. Special consideration for safety and communication needs will be reviewed on a case by case basis.  Special instructions:    Oral Hygiene is also important to reduce your risk of infection.  Remember - BRUSH YOUR TEETH THE MORNING OF SURGERY WITH YOUR REGULAR TOOTHPASTE   [facility]- Preparing For Surgery  Before surgery, you can play an important role. Because skin is not sterile, your skin needs to be as free of germs as possible. You can reduce the number of germs on your skin by washing with CHG (chlorahexidine  gluconate) Soap before surgery.  CHG is an antiseptic cleaner which kills germs and bonds with the skin to continue killing germs even after washing.     Please do not use if you have an allergy to CHG or antibacterial soaps. If your skin becomes reddened/irritated stop using the CHG.  Do not shave (including legs and underarms) for at least 48 hours prior to first CHG shower. It is OK to shave your face.  Please follow these instructions carefully.     Shower the NIGHT BEFORE SURGERY and the MORNING OF SURGERY with CHG Soap.   If you chose to wash your hair, wash your hair first as usual with your normal shampoo. After you shampoo, rinse your hair and body thoroughly to remove the shampoo.  Then [employer] and genitals (private parts) with your normal soap and rinse thoroughly to remove soap.  After that Use CHG Soap as you would any other liquid soap. You can apply CHG directly to the skin and wash gently with a scrungie or a clean washcloth.   Apply the CHG Soap to your body ONLY FROM THE NECK DOWN.  Do not use on open wounds or open sores. Avoid contact with your eyes, ears, mouth and genitals (private parts).  Wash Face and genitals (private parts)  with your normal soap.   Wash thoroughly, paying special attention to the area where your surgery will be performed.  Thoroughly rinse your body with warm water from the neck down.  DO NOT shower/wash with your normal soap after using and rinsing off the CHG Soap.  Pat yourself dry with a CLEAN TOWEL.  Wear CLEAN PAJAMAS to bed the night before surgery  Place CLEAN SHEETS on your bed the night before your surgery  DO NOT SLEEP WITH PETS.   Day of Surgery:  Take a shower with CHG soap. Wear Clean/Comfortable clothing the morning of surgery Do not apply any deodorants/lotions.   Remember to brush your teeth WITH YOUR REGULAR TOOTHPASTE.   Please read over the following fact sheets that you were given.

## 2021-02-07 ENCOUNTER — Encounter (HOSPITAL_COMMUNITY)
Admission: RE | Admit: 2021-02-07 | Discharge: 2021-02-07 | Disposition: A | Discharge status: Home / Self Care | Payer: Medicare HMO | Source: Ambulatory Visit | Attending: Orthopedic Surgery | Admitting: Orthopedic Surgery

## 2021-02-07 ENCOUNTER — Encounter (HOSPITAL_COMMUNITY): Payer: Self-pay

## 2021-02-07 ENCOUNTER — Other Ambulatory Visit: Payer: Self-pay

## 2021-02-07 ENCOUNTER — Telehealth: Payer: Self-pay | Admitting: Orthopedic Surgery

## 2021-02-07 DIAGNOSIS — Z634 Disappearance and death of family member: Secondary | ICD-10-CM | POA: Diagnosis not present

## 2021-02-07 DIAGNOSIS — I2510 Atherosclerotic heart disease of native coronary artery without angina pectoris: Secondary | ICD-10-CM | POA: Insufficient documentation

## 2021-02-07 DIAGNOSIS — Z79890 Hormone replacement therapy (postmenopausal): Secondary | ICD-10-CM | POA: Insufficient documentation

## 2021-02-07 DIAGNOSIS — Z6831 Body mass index (BMI) 31.0-31.9, adult: Secondary | ICD-10-CM | POA: Insufficient documentation

## 2021-02-07 DIAGNOSIS — M1712 Unilateral primary osteoarthritis, left knee: Secondary | ICD-10-CM | POA: Diagnosis not present

## 2021-02-07 DIAGNOSIS — K219 Gastro-esophageal reflux disease without esophagitis: Secondary | ICD-10-CM | POA: Diagnosis not present

## 2021-02-07 DIAGNOSIS — Z9989 Dependence on other enabling machines and devices: Secondary | ICD-10-CM | POA: Diagnosis not present

## 2021-02-07 DIAGNOSIS — K760 Fatty (change of) liver, not elsewhere classified: Secondary | ICD-10-CM | POA: Diagnosis not present

## 2021-02-07 DIAGNOSIS — Z79899 Other long term (current) drug therapy: Secondary | ICD-10-CM | POA: Diagnosis not present

## 2021-02-07 DIAGNOSIS — E039 Hypothyroidism, unspecified: Secondary | ICD-10-CM | POA: Insufficient documentation

## 2021-02-07 DIAGNOSIS — E669 Obesity, unspecified: Secondary | ICD-10-CM | POA: Diagnosis not present

## 2021-02-07 DIAGNOSIS — Z01818 Encounter for other preprocedural examination: Secondary | ICD-10-CM

## 2021-02-07 DIAGNOSIS — R011 Cardiac murmur, unspecified: Secondary | ICD-10-CM | POA: Insufficient documentation

## 2021-02-07 DIAGNOSIS — Z87891 Personal history of nicotine dependence: Secondary | ICD-10-CM | POA: Insufficient documentation

## 2021-02-07 DIAGNOSIS — E785 Hyperlipidemia, unspecified: Secondary | ICD-10-CM | POA: Insufficient documentation

## 2021-02-07 DIAGNOSIS — I10 Essential (primary) hypertension: Secondary | ICD-10-CM | POA: Diagnosis not present

## 2021-02-07 DIAGNOSIS — Z7982 Long term (current) use of aspirin: Secondary | ICD-10-CM | POA: Insufficient documentation

## 2021-02-07 DIAGNOSIS — R7303 Prediabetes: Secondary | ICD-10-CM | POA: Diagnosis not present

## 2021-02-07 DIAGNOSIS — G4733 Obstructive sleep apnea (adult) (pediatric): Secondary | ICD-10-CM | POA: Diagnosis not present

## 2021-02-07 DIAGNOSIS — Z7984 Long term (current) use of oral hypoglycemic drugs: Secondary | ICD-10-CM | POA: Insufficient documentation

## 2021-02-07 DIAGNOSIS — Z20822 Contact with and (suspected) exposure to covid-19: Secondary | ICD-10-CM | POA: Insufficient documentation

## 2021-02-07 DIAGNOSIS — Z01812 Encounter for preprocedural laboratory examination: Secondary | ICD-10-CM | POA: Insufficient documentation

## 2021-02-07 LAB — URINALYSIS, ROUTINE W REFLEX MICROSCOPIC
Bilirubin Urine: NEGATIVE
Glucose, UA: 50 mg/dL — AB
Hgb urine dipstick: NEGATIVE
Ketones, ur: NEGATIVE mg/dL
Leukocytes,Ua: NEGATIVE
Nitrite: NEGATIVE
Protein, ur: NEGATIVE mg/dL
Specific Gravity, Urine: 1.008 (ref 1.005–1.030)
pH: 6 (ref 5.0–8.0)

## 2021-02-07 LAB — CBC
HCT: 45.4 % (ref 39.0–52.0)
Hemoglobin: 15.6 g/dL (ref 13.0–17.0)
MCH: 32.4 pg (ref 26.0–34.0)
MCHC: 34.4 g/dL (ref 30.0–36.0)
MCV: 94.4 fL (ref 80.0–100.0)
Platelets: 268 10*3/uL (ref 150–400)
RBC: 4.81 MIL/uL (ref 4.22–5.81)
RDW: 12.8 % (ref 11.5–15.5)
WBC: 11.1 10*3/uL — ABNORMAL HIGH (ref 4.0–10.5)
nRBC: 0 % (ref 0.0–0.2)

## 2021-02-07 LAB — BASIC METABOLIC PANEL
Anion gap: 8 (ref 5–15)
BUN: 13 mg/dL (ref 6–20)
CO2: 31 mmol/L (ref 22–32)
Calcium: 8.9 mg/dL (ref 8.9–10.3)
Chloride: 100 mmol/L (ref 98–111)
Creatinine, Ser: 0.92 mg/dL (ref 0.61–1.24)
GFR, Estimated: >60 mL/min (ref >60)
Glucose, Bld: 134 mg/dL — ABNORMAL HIGH (ref 70–99)
Potassium: 3.5 mmol/L (ref 3.5–5.1)
Sodium: 139 mmol/L (ref 135–145)

## 2021-02-07 LAB — HEMOGLOBIN A1C
Hgb A1c MFr Bld: 6.5 % — ABNORMAL HIGH (ref 4.8–5.6)
Mean Plasma Glucose: 139.85 mg/dL

## 2021-02-07 LAB — SARS CORONAVIRUS 2 (TAT 6-24 HRS): SARS Coronavirus 2: NEGATIVE

## 2021-02-07 LAB — GLUCOSE, CAPILLARY: Glucose-Capillary: 140 mg/dL — ABNORMAL HIGH (ref 70–99)

## 2021-02-07 LAB — SURGICAL PCR SCREEN
MRSA, PCR: NEGATIVE
Staphylococcus aureus: NEGATIVE

## 2021-02-07 NOTE — Progress Notes (Signed)
PCP - Deborra Medina, MD Cardiologist - Kirk Ruths, MD  PPM/ICD - denies Device Orders - n/a Rep Notified - n/a  Chest x-ray - n/a EKG - 09/02/2020 Stress Test - denies ECHO - 10/04/2020 Cardiac Cath - 08/27/2015  Sleep Study - yes CPAP - yes  Fasting Blood Sugar - 86 - 120 Checks Blood Sugar once a day CBG today - 140 A1C - done today in PAT  Blood Thinner Instructions: n/a  Aspirin Instructions: Patient was instructed to ask MD if he must hold Aspirin prior surgery. Patient verbalized understanding.  Patient was instructed: As of today, STOP taking any Aspirin (unless otherwise instructed by your surgeon) Aleve, Naproxen, Ibuprofen, Motrin, Advil, Goody's, BC's, all herbal medications, fish oil, and all vitamins.  ERAS Protcol - yes PRE-SURGERY G2- yes  COVID TEST- yes in PAT on 02/07/2021  Anesthesia review: yes, cardiac history  Patient denies shortness of breath, fever, cough and chest pain at PAT appointment   All instructions explained to the patient, with a verbal understanding of the material. Patient agrees to go over the instructions while at home for a better understanding. Patient also instructed to self quarantine after being tested for COVID-19. The opportunity to ask questions was provided.

## 2021-02-07 NOTE — Progress Notes (Signed)
Abnormal UA in PAT: Glucose, UA - 50. Dr. Marlou Sa office was notified [name], surgery scheduler).

## 2021-02-07 NOTE — Telephone Encounter (Signed)
GRETA with [facility] pre-admissions called to inform:    Pateint's UA GLUCOSE IS 50   LEFT TOTAL KNEE is scheduled for 02-10-2021

## 2021-02-07 NOTE — Telephone Encounter (Signed)
A1c ok thx

## 2021-02-08 ENCOUNTER — Encounter (HOSPITAL_COMMUNITY): Payer: Self-pay

## 2021-02-08 LAB — URINE CULTURE: Culture: NO GROWTH

## 2021-02-08 NOTE — Progress Notes (Signed)
Anesthesia Chart Review:  Case: [id number] Date/Time: 02/10/21 1335   Procedure: LEFT TOTAL KNEE ARTHROPLASTY (Left: Knee)   Anesthesia type: Spinal   Pre-op diagnosis: LEFT KNEE OSTEOARTHRITIS   Location: [facility] ROOM 07 / [facility]   Surgeons: Meredith Pel, MD       DISCUSSION: Patient is a 55 year old male scheduled for the above procedure.  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  BMI is consistent with obesity.  He is a fairly recent widow (wife died 2/59/56 from complications CHF by notes). ETOH intake is documented as 4 standard drinks/week.  He had preoperative cardiology evaluation by Dr. Stanford Breed on 09/08/20: "Preoperative evauation prior to TKA-patient has had no cardiac symptoms.  Previous CTA showed mild disease.  Electrocardiogram shows question previous inferior and septal infarct.  I will arrange an echocardiogram to assess LV function and wall motion.  If normal he may proceed with his knee replacement without further cardiac evaluation." 6//13/22 echo showed LVEF 65-70%, no regional wall motion abnormalities, grade II DD, normal RVSF, normal PASP, trivial MR/AR.   02/07/2021 presurgical COVID-19 test negative.  Anesthesia team to evaluate on the day of surgery.   VS: BP (!) 149/88   Pulse 63   Temp 36.8 C (Oral)   Resp 18   Ht 6' (1.829 m)   Wt 105.2 kg   SpO2 99%   BMI 31.45 kg/m    PROVIDERS: [name], MD is PCP  - Kirk Ruths, MD is cardiologist - Berniece Andreas, MD is psychiatrist - Chesley Mires, MD. Last visit 09/10/19 for OSA. Kary Kos, MD is neurosurgeon    LABS: Labs reviewed: Acceptable for surgery. AST 20, ALT 23 on 12/16/20.  (all  labs ordered are listed, but only abnormal results are displayed)  Labs Reviewed  CBC - Abnormal; Notable for the following components:      Result Value   WBC 11.1 (*)    All other components within normal limits  BASIC METABOLIC PANEL - Abnormal; Notable for the following components:   Glucose, Bld 134 (*)    All other components within normal limits  URINALYSIS, ROUTINE W REFLEX MICROSCOPIC - Abnormal; Notable for the following components:   Glucose, UA 50 (*)    All other components within normal limits  HEMOGLOBIN A1C - Abnormal; Notable for the following components:   Hgb A1c MFr Bld 6.5 (*)    All other components within normal limits  GLUCOSE, CAPILLARY - Abnormal; Notable for the following components:   Glucose-Capillary 140 (*)    All other components within normal limits  URINE CULTURE  SURGICAL PCR SCREEN  SARS CORONAVIRUS 2 (TAT 6-24 HRS)     IMAGES: MRI C-spine 01/19/20 ([facility]): IMPRESSION:  Multilevel mild degenerative changes.  There is a moderate left neuroforaminal stenosis at C3-4.  Otherwise no specific cause for a left arm radiculopathy seen.   MRI L-spine 12/05/18: IMPRESSION: - No new abnormality since the prior MRI. - Shallow left subarticular recess and foraminal protrusion at L3-4 causes mild narrowing in the left subarticular recess. No nerve root compression is identified.       EKG: 09/02/20: Sinus  Bradycardia at 59 bpm - Short PR syndrome  PRi = 112 Low voltage in limb leads.  -  Nonspecific QRS widening and anterior fascicular block.  - Nonspecific T-abnormality.  ABNORMAL     CV: Echo 10/04/20: IMPRESSIONS   1. Left ventricular ejection fraction, by estimation, is 65 to 70%. The  left ventricle has normal function. The left ventricle has no regional  wall motion abnormalities. Left ventricular diastolic parameters are  consistent with Grade II diastolic  dysfunction (pseudonormalization).   2. Right ventricular systolic  function is normal. The right ventricular  size is normal. There is normal pulmonary artery systolic pressure.   3. Right atrial size was mildly dilated.   4. The mitral valve is normal in structure. Trivial mitral valve  regurgitation.   5. The aortic valve is normal in structure. Aortic valve regurgitation is  trivial.    Coronary CT 02/17/19: IMPRESSION: 1. Coronary artery calcium score 50 Agatston units. This places the patient in the 82nd percentile for age and gender, suggesting high risk for future cardiac events. 2.  Nonobstructive disease in the LAD and RCA systems. 3. Misregistration artifact makes interpretation of the mid LCx difficult. I suspect mild stenosis in the mid LCx. Due to misregistration, I do not think that FFR would be successful on this study.   Cardiac cath 08/27/15: Assessment & Plan: 1. Minimal CAD with 30% mid LCX lesion. LM, LAD, RCA angiographically normal. Plan Medical therapy.      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   Anxiety    Anxiety and depression    Arthritis    knees and shoulders   Benzodiazepine withdrawal with complication ([place]) 07/31/8117   Bruit    L   Chest pain    hx   Cognitive complaints with normal neuropsychological exam 10/23/2016   Diabetes mellitus without complication ([place])    "borderline", diet controlled, no meds, patient has lost 30 lbs   Edema    Fatty liver    GERD (gastroesophageal reflux disease)    uses Omeprazole   Goiter    HLD (hyperlipidemia)    Hypertension    essential, benign   Hypothyroidism    Impotence of organic origin    Murmur    never has caused any problems   Neuromuscular disorder ([place])    Other chest pain    tightness, pressure   Palpitation    hx   Precordial pain    Sleep apnea    uses cpap    Past Surgical History:  Procedure Laterality Date   APPENDECTOMY  2002   done at  [facility] N/A 08/27/2015   Procedure: Left Heart Cath and Coronary Angiography;  Surgeon: Jolaine Artist, MD;  Location: [facility];  Service: Cardiovascular;  Laterality: N/A;   COLONOSCOPY  in his 20's   EAR CYST EXCISION Left 07/08/2019   Procedure: open excision baker's cyst left knee;  Surgeon: Meredith Pel, MD;  Location: [facility];  Service: Orthopedics;  Laterality: Left;   HERNIA REPAIR  1-47-82   umbilical   JOINT REPLACEMENT     right knee   KNEE ARTHROSCOPY Left 12/17/2018   Procedure: left knee arthroscopy, meniscal debridement, loose body removal;  Surgeon: Meredith Pel, MD;  Location: [facility];  Service: Orthopedics;  Laterality: Left;   REVISION TOTAL KNEE ARTHROPLASTY Right 06/23/2014   DR Marlou Sa   TONSILLECTOMY     TOTAL KNEE ARTHROPLASTY Right 2011  right   TOTAL KNEE REVISION Right 06/23/2014   Procedure: TOTAL KNEE REVISION;  Surgeon: Meredith Pel, MD;  Location: [facility];  Service: Orthopedics;  Laterality: Right;   UPPER GASTROINTESTINAL ENDOSCOPY  04/30/2013   Judieth Keens TOOTH EXTRACTION      MEDICATIONS:  amLODipine (NORVASC) 5 MG tablet   ARIPiprazole (ABILIFY) 5 MG tablet   aspirin EC 81 MG tablet   atorvastatin (LIPITOR) 80 MG tablet   blood glucose meter kit and supplies   buPROPion (WELLBUTRIN XL) 300 MG 24 hr tablet   busPIRone (BUSPAR) 30 MG tablet   celecoxib (CELEBREX) 200 MG capsule   clobetasol (TEMOVATE) 0.05 % external solution   Clobetasol Propionate 0.05 % shampoo   clonazePAM (KLONOPIN) 1 MG tablet   dicyclomine (BENTYL) 20 MG tablet   ezetimibe (ZETIA) 10 MG tablet   furosemide (LASIX) 20 MG tablet   gabapentin (NEURONTIN) 600 MG tablet   glucose blood test strip   HYDROcodone-acetaminophen (NORCO) 10-325 MG tablet   levothyroxine (SYNTHROID) 88 MCG tablet   losartan (COZAAR) 100 MG tablet   metFORMIN (GLUCOPHAGE) 500 MG tablet   methocarbamol (ROBAXIN) 500  MG tablet   metoprolol tartrate (LOPRESSOR) 25 MG tablet   Multiple Vitamin (MULTIVITAMIN WITH MINERALS) TABS tablet   omeprazole (PRILOSEC) 40 MG capsule   promethazine (PHENERGAN) 12.5 MG tablet   traZODone (DESYREL) 100 MG tablet   No current facility-administered medications for this encounter.  Advised by PAT RN to contact surgeon regarding perioperative ASA instructions.    Myra Gianotti, PA-C Surgical Short Stay/Anesthesiology [facility] Phone [phone] [employer] Phone [phone] 02/08/2021 12:59 PM

## 2021-02-08 NOTE — Anesthesia Preprocedure Evaluation (Addendum)
Anesthesia Evaluation  Patient identified by MRN, date of birth, ID band Patient awake    Reviewed: Allergy & Precautions, NPO status , Patient's Chart, lab work & pertinent test results, reviewed documented beta blocker date and time   History of Anesthesia Complications Negative for: history of anesthetic complications  Airway Mallampati: II  TM Distance: >3 FB Neck ROM: Full    Dental  (+) Dental Advisory Given   Pulmonary sleep apnea and Continuous Positive Airway Pressure Ventilation , former smoker,  02/07/2021 SARS coronavirus NEG   breath sounds clear to auscultation       Cardiovascular hypertension, Pt. on medications and Pt. on home beta blockers (-) angina+ CAD (non-obstructive)   Rhythm:Regular Rate:Normal  09/2020 ECHO:  LVEF 65-70%, no regional wall motion abnormalities, grade II DD, normal RVSF, normal PASP, trivial MR/AR.    Neuro/Psych Anxiety Depression    GI/Hepatic negative GI ROS, (+)     substance abuse  alcohol use, H/o pancreatitis   Endo/Other  diabetes (glu 127), Oral Hypoglycemic AgentsHypothyroidism obese  Renal/GU      Musculoskeletal   Abdominal (+) + obese,   Peds  Hematology negative hematology ROS (+)   Anesthesia Other Findings   Reproductive/Obstetrics                           Anesthesia Physical Anesthesia Plan  ASA: 3  Anesthesia Plan: Spinal   Post-op Pain Management:  Regional for Post-op pain   Induction:   PONV Risk Score and Plan: 1 and Ondansetron and Treatment may vary due to age or medical condition  Airway Management Planned: Natural Airway and Simple Face Mask  Additional Equipment: None  Intra-op Plan:   Post-operative Plan:   Informed Consent: I have reviewed the patients History and Physical, chart, labs and discussed the procedure including the risks, benefits and alternatives for the proposed anesthesia with the patient  or authorized representative who has indicated his/her understanding and acceptance.     Dental advisory given  Plan Discussed with: CRNA and Surgeon  Anesthesia Plan Comments: (PAT note written 02/08/2021 by Myra Gianotti, PA-C.  Plan routine monitors, SAB with adductor canal block for post op analgesia )      Anesthesia Quick Evaluation

## 2021-02-09 MED ORDER — TRANEXAMIC ACID 1000 MG/10ML IV SOLN
2000.0000 mg | INTRAVENOUS | Status: AC
  Administered 2021-02-10: 2000 mg via TOPICAL
  Filled 2021-02-09: qty 20

## 2021-02-10 ENCOUNTER — Ambulatory Visit (HOSPITAL_COMMUNITY): Payer: Medicare HMO | Admitting: Anesthesiology

## 2021-02-10 ENCOUNTER — Encounter (HOSPITAL_COMMUNITY): Payer: Self-pay | Admitting: Orthopedic Surgery

## 2021-02-10 ENCOUNTER — Encounter (HOSPITAL_COMMUNITY)
Admission: RE | Disposition: A | Discharge status: Home / Self Care | Payer: Self-pay | Source: Home / Self Care | Attending: Orthopedic Surgery

## 2021-02-10 ENCOUNTER — Observation Stay (HOSPITAL_COMMUNITY)
Admission: RE | Admit: 2021-02-10 | Discharge: 2021-02-12 | Disposition: A | Discharge status: Home / Self Care | Payer: Medicare HMO | Attending: Orthopedic Surgery | Admitting: Orthopedic Surgery

## 2021-02-10 ENCOUNTER — Ambulatory Visit (HOSPITAL_COMMUNITY): Payer: Medicare HMO | Admitting: Vascular Surgery

## 2021-02-10 DIAGNOSIS — Z87891 Personal history of nicotine dependence: Secondary | ICD-10-CM | POA: Insufficient documentation

## 2021-02-10 DIAGNOSIS — E119 Type 2 diabetes mellitus without complications: Secondary | ICD-10-CM | POA: Insufficient documentation

## 2021-02-10 DIAGNOSIS — E039 Hypothyroidism, unspecified: Secondary | ICD-10-CM | POA: Insufficient documentation

## 2021-02-10 DIAGNOSIS — Z01818 Encounter for other preprocedural examination: Secondary | ICD-10-CM

## 2021-02-10 DIAGNOSIS — G8918 Other acute postprocedural pain: Secondary | ICD-10-CM | POA: Diagnosis not present

## 2021-02-10 DIAGNOSIS — M1712 Unilateral primary osteoarthritis, left knee: Principal | ICD-10-CM | POA: Insufficient documentation

## 2021-02-10 DIAGNOSIS — Z96652 Presence of left artificial knee joint: Secondary | ICD-10-CM

## 2021-02-10 DIAGNOSIS — Z96651 Presence of right artificial knee joint: Secondary | ICD-10-CM | POA: Insufficient documentation

## 2021-02-10 DIAGNOSIS — I10 Essential (primary) hypertension: Secondary | ICD-10-CM | POA: Insufficient documentation

## 2021-02-10 DIAGNOSIS — K219 Gastro-esophageal reflux disease without esophagitis: Secondary | ICD-10-CM | POA: Diagnosis not present

## 2021-02-10 DIAGNOSIS — G8929 Other chronic pain: Secondary | ICD-10-CM | POA: Insufficient documentation

## 2021-02-10 DIAGNOSIS — M1990 Unspecified osteoarthritis, unspecified site: Secondary | ICD-10-CM | POA: Diagnosis present

## 2021-02-10 DIAGNOSIS — Z9989 Dependence on other enabling machines and devices: Secondary | ICD-10-CM | POA: Diagnosis not present

## 2021-02-10 DIAGNOSIS — Z79899 Other long term (current) drug therapy: Secondary | ICD-10-CM | POA: Insufficient documentation

## 2021-02-10 DIAGNOSIS — Z23 Encounter for immunization: Secondary | ICD-10-CM | POA: Insufficient documentation

## 2021-02-10 DIAGNOSIS — G4733 Obstructive sleep apnea (adult) (pediatric): Secondary | ICD-10-CM | POA: Diagnosis not present

## 2021-02-10 HISTORY — PX: TOTAL KNEE ARTHROPLASTY: SHX125

## 2021-02-10 LAB — GLUCOSE, CAPILLARY
Glucose-Capillary: 127 mg/dL — ABNORMAL HIGH (ref 70–99)
Glucose-Capillary: 98 mg/dL (ref 70–99)

## 2021-02-10 SURGERY — ARTHROPLASTY, KNEE, TOTAL
Anesthesia: Spinal | Site: Knee | Laterality: Left

## 2021-02-10 MED ORDER — LABETALOL HCL 5 MG/ML IV SOLN: 5.0000 mg | INTRAVENOUS | Status: DC | PRN

## 2021-02-10 MED ORDER — ORAL CARE MOUTH RINSE: 15.0000 mL | Freq: Once | OROMUCOSAL | Status: DC

## 2021-02-10 MED ORDER — CELECOXIB 200 MG PO CAPS
200.0000 mg | ORAL_CAPSULE | Freq: Two times a day (BID) | ORAL | Status: DC
  Administered 2021-02-10 – 2021-02-12 (×4): 200 mg via ORAL
  Filled 2021-02-10 (×4): qty 1

## 2021-02-10 MED ORDER — FENTANYL CITRATE (PF) 250 MCG/5ML IJ SOLN
INTRAMUSCULAR | Status: DC | PRN
  Administered 2021-02-10: 100 ug via INTRAVENOUS

## 2021-02-10 MED ORDER — GABAPENTIN 300 MG PO CAPS
600.0000 mg | ORAL_CAPSULE | Freq: Three times a day (TID) | ORAL | Status: DC
  Administered 2021-02-10 – 2021-02-12 (×5): 600 mg via ORAL
  Filled 2021-02-10 (×5): qty 2

## 2021-02-10 MED ORDER — LIDOCAINE 2% (20 MG/ML) 5 ML SYRINGE
INTRAMUSCULAR | Status: DC | PRN
  Administered 2021-02-10: 100 mg via INTRAVENOUS

## 2021-02-10 MED ORDER — CEFAZOLIN SODIUM-DEXTROSE 2-4 GM/100ML-% IV SOLN
2.0000 g | Freq: Three times a day (TID) | INTRAVENOUS | Status: AC
  Administered 2021-02-11 (×2): 2 g via INTRAVENOUS
  Filled 2021-02-10 (×4): qty 100

## 2021-02-10 MED ORDER — BUPIVACAINE-EPINEPHRINE (PF) 0.5% -1:200000 IJ SOLN
INTRAMUSCULAR | Status: DC | PRN
  Administered 2021-02-10: 25 mL via PERINEURAL

## 2021-02-10 MED ORDER — VANCOMYCIN HCL 1000 MG IV SOLR
INTRAVENOUS | Status: AC
  Filled 2021-02-10: qty 20

## 2021-02-10 MED ORDER — ONDANSETRON HCL 4 MG PO TABS: 4.0000 mg | ORAL_TABLET | Freq: Four times a day (QID) | ORAL | Status: DC | PRN

## 2021-02-10 MED ORDER — MIDAZOLAM HCL 2 MG/2ML IJ SOLN: 0.5000 mg | Freq: Once | INTRAMUSCULAR | Status: DC | PRN

## 2021-02-10 MED ORDER — PROMETHAZINE HCL 25 MG/ML IJ SOLN: 6.2500 mg | INTRAMUSCULAR | Status: DC | PRN

## 2021-02-10 MED ORDER — METOCLOPRAMIDE HCL 5 MG PO TABS
5.0000 mg | ORAL_TABLET | Freq: Three times a day (TID) | ORAL | Status: DC | PRN
Start: 2021-02-10 — End: 2021-02-12

## 2021-02-10 MED ORDER — LACTATED RINGERS IV SOLN: INTRAVENOUS | Status: DC

## 2021-02-10 MED ORDER — SODIUM CHLORIDE 0.9 % IR SOLN
Status: DC | PRN
  Administered 2021-02-10: 3000 mL

## 2021-02-10 MED ORDER — MORPHINE SULFATE (PF) 4 MG/ML IV SOLN
INTRAVENOUS | Status: AC
  Filled 2021-02-10: qty 2

## 2021-02-10 MED ORDER — ONDANSETRON HCL 4 MG/2ML IJ SOLN: 4.0000 mg | Freq: Four times a day (QID) | INTRAMUSCULAR | Status: DC | PRN

## 2021-02-10 MED ORDER — BUPIVACAINE HCL (PF) 0.25 % IJ SOLN
INTRAMUSCULAR | Status: AC
  Filled 2021-02-10: qty 20

## 2021-02-10 MED ORDER — MIDAZOLAM HCL 2 MG/2ML IJ SOLN
INTRAMUSCULAR | Status: AC
  Filled 2021-02-10: qty 2

## 2021-02-10 MED ORDER — FENTANYL CITRATE (PF) 250 MCG/5ML IJ SOLN
INTRAMUSCULAR | Status: AC
  Filled 2021-02-10: qty 5

## 2021-02-10 MED ORDER — ACETAMINOPHEN 500 MG PO TABS
1000.0000 mg | ORAL_TABLET | Freq: Four times a day (QID) | ORAL | Status: AC
  Administered 2021-02-10 – 2021-02-11 (×4): 1000 mg via ORAL
  Filled 2021-02-10 (×4): qty 2

## 2021-02-10 MED ORDER — BUPIVACAINE HCL 0.25 % IJ SOLN
INTRAMUSCULAR | Status: DC | PRN
  Administered 2021-02-10: 20 mL
  Administered 2021-02-10: 10 mL

## 2021-02-10 MED ORDER — DICYCLOMINE HCL 20 MG PO TABS
20.0000 mg | ORAL_TABLET | Freq: Four times a day (QID) | ORAL | Status: DC
  Administered 2021-02-11 – 2021-02-12 (×7): 20 mg via ORAL
  Filled 2021-02-10 (×11): qty 1

## 2021-02-10 MED ORDER — ACETAMINOPHEN 325 MG PO TABS
325.0000 mg | ORAL_TABLET | Freq: Four times a day (QID) | ORAL | Status: DC | PRN
  Administered 2021-02-11 – 2021-02-12 (×2): 650 mg via ORAL
  Filled 2021-02-10 (×2): qty 2

## 2021-02-10 MED ORDER — EZETIMIBE 10 MG PO TABS
10.0000 mg | ORAL_TABLET | Freq: Every day | ORAL | Status: DC
  Administered 2021-02-11 – 2021-02-12 (×2): 10 mg via ORAL
  Filled 2021-02-10 (×2): qty 1

## 2021-02-10 MED ORDER — VANCOMYCIN HCL 1000 MG IV SOLR
INTRAVENOUS | Status: DC | PRN
  Administered 2021-02-10: 1000 mg via TOPICAL

## 2021-02-10 MED ORDER — PROPOFOL 10 MG/ML IV BOLUS
INTRAVENOUS | Status: AC
  Filled 2021-02-10: qty 20

## 2021-02-10 MED ORDER — MEPERIDINE HCL 25 MG/ML IJ SOLN: 6.2500 mg | INTRAMUSCULAR | Status: DC | PRN

## 2021-02-10 MED ORDER — MIDAZOLAM HCL 2 MG/2ML IJ SOLN
INTRAMUSCULAR | Status: DC | PRN
  Administered 2021-02-10: 2 mg via INTRAVENOUS

## 2021-02-10 MED ORDER — POVIDONE-IODINE 10 % EX SWAB: 2.0000 "application " | Freq: Once | CUTANEOUS | Status: DC

## 2021-02-10 MED ORDER — ACETAMINOPHEN 500 MG PO TABS: 1000.0000 mg | ORAL_TABLET | Freq: Once | ORAL | Status: DC

## 2021-02-10 MED ORDER — PANTOPRAZOLE SODIUM 40 MG PO TBEC
40.0000 mg | DELAYED_RELEASE_TABLET | Freq: Every day | ORAL | Status: DC
  Administered 2021-02-11 – 2021-02-12 (×2): 40 mg via ORAL
  Filled 2021-02-10 (×2): qty 1

## 2021-02-10 MED ORDER — BUPIVACAINE HCL (PF) 0.25 % IJ SOLN
INTRAMUSCULAR | Status: AC
  Filled 2021-02-10: qty 30

## 2021-02-10 MED ORDER — BUPIVACAINE LIPOSOME 1.3 % IJ SUSP
INTRAMUSCULAR | Status: AC
  Filled 2021-02-10: qty 20

## 2021-02-10 MED ORDER — FUROSEMIDE 20 MG PO TABS
20.0000 mg | ORAL_TABLET | ORAL | Status: DC
  Administered 2021-02-11: 20 mg via ORAL
  Filled 2021-02-10: qty 1

## 2021-02-10 MED ORDER — MORPHINE SULFATE (PF) 4 MG/ML IV SOLN
INTRAVENOUS | Status: DC | PRN
  Administered 2021-02-10: 8 mg

## 2021-02-10 MED ORDER — PHENOL 1.4 % MT LIQD: 1.0000 | OROMUCOSAL | Status: DC | PRN

## 2021-02-10 MED ORDER — 0.9 % SODIUM CHLORIDE (POUR BTL) OPTIME
TOPICAL | Status: DC | PRN
  Administered 2021-02-10: 1000 mL

## 2021-02-10 MED ORDER — AMLODIPINE BESYLATE 5 MG PO TABS
5.0000 mg | ORAL_TABLET | Freq: Every day | ORAL | Status: DC
  Administered 2021-02-11 – 2021-02-12 (×2): 5 mg via ORAL
  Filled 2021-02-10 (×2): qty 1

## 2021-02-10 MED ORDER — PROPOFOL 10 MG/ML IV BOLUS
INTRAVENOUS | Status: DC | PRN
  Administered 2021-02-10: 60 mg via INTRAVENOUS

## 2021-02-10 MED ORDER — CLONIDINE HCL (ANALGESIA) 100 MCG/ML EP SOLN
EPIDURAL | Status: AC
  Filled 2021-02-10: qty 10

## 2021-02-10 MED ORDER — FENTANYL CITRATE (PF) 100 MCG/2ML IJ SOLN
INTRAMUSCULAR | Status: AC
  Filled 2021-02-10: qty 2

## 2021-02-10 MED ORDER — CHLORHEXIDINE GLUCONATE 0.12 % MT SOLN
15.0000 mL | Freq: Once | OROMUCOSAL | Status: DC
Start: 2021-02-10 — End: 2021-02-10
  Filled 2021-02-10: qty 15

## 2021-02-10 MED ORDER — ASPIRIN 81 MG PO CHEW
81.0000 mg | CHEWABLE_TABLET | Freq: Two times a day (BID) | ORAL | Status: DC
  Administered 2021-02-10 – 2021-02-12 (×4): 81 mg via ORAL
  Filled 2021-02-10 (×4): qty 1

## 2021-02-10 MED ORDER — METOCLOPRAMIDE HCL 5 MG/ML IJ SOLN
5.0000 mg | Freq: Three times a day (TID) | INTRAMUSCULAR | Status: DC | PRN
Start: 2021-02-10 — End: 2021-02-12

## 2021-02-10 MED ORDER — EPHEDRINE 5 MG/ML INJ
INTRAVENOUS | Status: AC
  Filled 2021-02-10: qty 5

## 2021-02-10 MED ORDER — OXYCODONE HCL 5 MG PO TABS
5.0000 mg | ORAL_TABLET | Freq: Once | ORAL | Status: AC | PRN
  Administered 2021-02-10: 5 mg via ORAL

## 2021-02-10 MED ORDER — CEFAZOLIN SODIUM-DEXTROSE 2-4 GM/100ML-% IV SOLN
2.0000 g | INTRAVENOUS | Status: AC
  Administered 2021-02-10: 2 g via INTRAVENOUS
  Filled 2021-02-10: qty 100

## 2021-02-10 MED ORDER — HYDROMORPHONE HCL 1 MG/ML IJ SOLN: 0.2500 mg | INTRAMUSCULAR | Status: DC | PRN

## 2021-02-10 MED ORDER — BUPIVACAINE-EPINEPHRINE (PF) 0.25% -1:200000 IJ SOLN
INTRAMUSCULAR | Status: AC
  Filled 2021-02-10: qty 30

## 2021-02-10 MED ORDER — BUPIVACAINE LIPOSOME 1.3 % IJ SUSP
INTRAMUSCULAR | Status: DC | PRN
  Administered 2021-02-10: 20 mL

## 2021-02-10 MED ORDER — BUPIVACAINE IN DEXTROSE 0.75-8.25 % IT SOLN
INTRATHECAL | Status: DC | PRN
  Administered 2021-02-10: 2 mL via INTRATHECAL

## 2021-02-10 MED ORDER — METOPROLOL TARTRATE 25 MG PO TABS
25.0000 mg | ORAL_TABLET | Freq: Two times a day (BID) | ORAL | Status: DC
  Administered 2021-02-10 – 2021-02-12 (×4): 25 mg via ORAL
  Filled 2021-02-10 (×4): qty 1

## 2021-02-10 MED ORDER — METFORMIN HCL 500 MG PO TABS
500.0000 mg | ORAL_TABLET | Freq: Two times a day (BID) | ORAL | Status: DC
  Administered 2021-02-11 – 2021-02-12 (×3): 500 mg via ORAL
  Filled 2021-02-10 (×3): qty 1

## 2021-02-10 MED ORDER — DOCUSATE SODIUM 100 MG PO CAPS
100.0000 mg | ORAL_CAPSULE | Freq: Two times a day (BID) | ORAL | Status: DC
  Administered 2021-02-10 – 2021-02-12 (×4): 100 mg via ORAL
  Filled 2021-02-10 (×4): qty 1

## 2021-02-10 MED ORDER — OXYCODONE HCL 5 MG PO TABS
5.0000 mg | ORAL_TABLET | ORAL | Status: DC | PRN
  Administered 2021-02-11 – 2021-02-12 (×8): 10 mg via ORAL
  Filled 2021-02-10 (×9): qty 2

## 2021-02-10 MED ORDER — EPHEDRINE SULFATE-NACL 50-0.9 MG/10ML-% IV SOSY
PREFILLED_SYRINGE | INTRAVENOUS | Status: DC | PRN
  Administered 2021-02-10 (×2): 10 mg via INTRAVENOUS

## 2021-02-10 MED ORDER — TRANEXAMIC ACID-NACL 1000-0.7 MG/100ML-% IV SOLN
1000.0000 mg | INTRAVENOUS | Status: AC
  Administered 2021-02-10: 1000 mg via INTRAVENOUS
  Filled 2021-02-10: qty 100

## 2021-02-10 MED ORDER — POVIDONE-IODINE 7.5 % EX SOLN
Freq: Once | CUTANEOUS | Status: DC
  Filled 2021-02-10: qty 118

## 2021-02-10 MED ORDER — BUPROPION HCL ER (XL) 300 MG PO TB24
300.0000 mg | ORAL_TABLET | Freq: Every day | ORAL | Status: DC
  Administered 2021-02-11 – 2021-02-12 (×2): 300 mg via ORAL
  Filled 2021-02-10 (×2): qty 1

## 2021-02-10 MED ORDER — HYDROMORPHONE HCL 1 MG/ML IJ SOLN
0.5000 mg | INTRAMUSCULAR | Status: DC | PRN
  Administered 2021-02-10: 0.5 mg via INTRAVENOUS
  Filled 2021-02-10 (×2): qty 0.5

## 2021-02-10 MED ORDER — IRRISEPT - 450ML BOTTLE WITH 0.05% CHG IN STERILE WATER, USP 99.95% OPTIME
TOPICAL | Status: DC | PRN
  Administered 2021-02-10: 450 mL via TOPICAL

## 2021-02-10 MED ORDER — LEVOTHYROXINE SODIUM 88 MCG PO TABS
88.0000 ug | ORAL_TABLET | Freq: Every day | ORAL | Status: DC
  Administered 2021-02-11 – 2021-02-12 (×2): 88 ug via ORAL
  Filled 2021-02-10 (×2): qty 1

## 2021-02-10 MED ORDER — METHOCARBAMOL 500 MG PO TABS
500.0000 mg | ORAL_TABLET | Freq: Four times a day (QID) | ORAL | Status: DC | PRN
  Administered 2021-02-11 – 2021-02-12 (×4): 500 mg via ORAL
  Filled 2021-02-10 (×4): qty 1

## 2021-02-10 MED ORDER — METHOCARBAMOL 1000 MG/10ML IJ SOLN
500.0000 mg | Freq: Four times a day (QID) | INTRAVENOUS | Status: DC | PRN
  Filled 2021-02-10: qty 5

## 2021-02-10 MED ORDER — SODIUM CHLORIDE 0.9% FLUSH
INTRAVENOUS | Status: DC | PRN
  Administered 2021-02-10: 20 mL

## 2021-02-10 MED ORDER — PROPOFOL 500 MG/50ML IV EMUL
INTRAVENOUS | Status: DC | PRN
  Administered 2021-02-10: 75 ug/kg/min via INTRAVENOUS

## 2021-02-10 MED ORDER — INFLUENZA VAC SPLIT QUAD 0.5 ML IM SUSY
0.5000 mL | PREFILLED_SYRINGE | INTRAMUSCULAR | Status: AC
  Administered 2021-02-11: 0.5 mL via INTRAMUSCULAR
  Filled 2021-02-10: qty 0.5

## 2021-02-10 MED ORDER — CLONIDINE HCL (ANALGESIA) 100 MCG/ML EP SOLN
EPIDURAL | Status: DC | PRN
  Administered 2021-02-10: 1 mL

## 2021-02-10 MED ORDER — MENTHOL 3 MG MT LOZG: 1.0000 | LOZENGE | OROMUCOSAL | Status: DC | PRN

## 2021-02-10 MED ORDER — OXYCODONE HCL 5 MG/5ML PO SOLN
5.0000 mg | Freq: Once | ORAL | Status: AC | PRN
Start: 2021-02-10 — End: 2021-02-10

## 2021-02-10 SURGICAL SUPPLY — 78 items
BAG COUNTER SPONGE SURGICOUNT (BAG) ×2 IMPLANT
BAG DECANTER FOR FLEXI CONT (MISCELLANEOUS) ×2 IMPLANT
BANDAGE ESMARK 6X9 LF (GAUZE/BANDAGES/DRESSINGS) ×1 IMPLANT
BLADE SAG 18X100X1.27 (BLADE) ×2 IMPLANT
BLADE SAGITTAL (BLADE) ×2
BLADE SAW THK.89X75X18XSGTL (BLADE) ×1 IMPLANT
BNDG COHESIVE 6X5 TAN STRL LF (GAUZE/BANDAGES/DRESSINGS) ×2 IMPLANT
BNDG ELASTIC 6X15 VLCR STRL LF (GAUZE/BANDAGES/DRESSINGS) ×2 IMPLANT
BNDG ESMARK 6X9 LF (GAUZE/BANDAGES/DRESSINGS) ×2
BOWL SMART MIX CTS (DISPOSABLE) IMPLANT
CNTNR URN SCR LID CUP LEK RST (MISCELLANEOUS) ×1 IMPLANT
COMP FEM SZ5 CRUC LEFT RETAIN (Orthopedic Implant) ×2 IMPLANT
COMPONENT FEM SZ5 CRU LT RETN (Orthopedic Implant) ×1 IMPLANT
CONT SPEC 4OZ STRL OR WHT (MISCELLANEOUS) ×2
COOLER ICEMAN CLASSIC (MISCELLANEOUS) ×2 IMPLANT
COVER SURGICAL LIGHT HANDLE (MISCELLANEOUS) ×2 IMPLANT
CUFF TOURN SGL QUICK 34 (TOURNIQUET CUFF) ×2
CUFF TOURN SGL QUICK 42 (TOURNIQUET CUFF) IMPLANT
CUFF TRNQT CYL 34X4.125X (TOURNIQUET CUFF) ×1 IMPLANT
DECANTER SPIKE VIAL GLASS SM (MISCELLANEOUS) ×2 IMPLANT
DRAPE INCISE IOBAN 66X45 STRL (DRAPES) IMPLANT
DRAPE ORTHO SPLIT 77X108 STRL (DRAPES) ×6
DRAPE SURG ORHT 6 SPLT 77X108 (DRAPES) ×3 IMPLANT
DRAPE U-SHAPE 47X51 STRL (DRAPES) ×2 IMPLANT
DRSG AQUACEL AG ADV 3.5X14 (GAUZE/BANDAGES/DRESSINGS) ×2 IMPLANT
DURAPREP 26ML APPLICATOR (WOUND CARE) ×6 IMPLANT
ELECT CAUTERY BLADE 6.4 (BLADE) ×2 IMPLANT
ELECT REM PT RETURN 9FT ADLT (ELECTROSURGICAL) ×2
ELECTRODE REM PT RTRN 9FT ADLT (ELECTROSURGICAL) ×1 IMPLANT
GAUZE SPONGE 4X4 12PLY STRL (GAUZE/BANDAGES/DRESSINGS) IMPLANT
GLOVE SRG 8 PF TXTR STRL LF DI (GLOVE) ×1 IMPLANT
GLOVE SURG LTX SZ7 (GLOVE) ×2 IMPLANT
GLOVE SURG LTX SZ8 (GLOVE) ×2 IMPLANT
GLOVE SURG UNDER POLY LF SZ7 (GLOVE) ×2 IMPLANT
GLOVE SURG UNDER POLY LF SZ8 (GLOVE) ×2
GOWN STRL REUS W/ TWL LRG LVL3 (GOWN DISPOSABLE) ×3 IMPLANT
GOWN STRL REUS W/TWL LRG LVL3 (GOWN DISPOSABLE) ×6
HANDPIECE INTERPULSE COAX TIP (DISPOSABLE) ×2
HOOD PEEL AWAY FLYTE STAYCOOL (MISCELLANEOUS) ×6 IMPLANT
IMMOBILIZER KNEE 20 (SOFTGOODS)
IMMOBILIZER KNEE 20 THIGH 36 (SOFTGOODS) IMPLANT
IMMOBILIZER KNEE 22 UNIV (SOFTGOODS) ×2 IMPLANT
IMMOBILIZER KNEE 24 THIGH 36 (MISCELLANEOUS) IMPLANT
IMMOBILIZER KNEE 24 UNIV (MISCELLANEOUS)
INSERT TRIATH CS X3 11 (Insert) ×2 IMPLANT
KIT BASIN OR (CUSTOM PROCEDURE TRAY) ×2 IMPLANT
KIT TURNOVER KIT B (KITS) ×2 IMPLANT
KNEE PATELLA ASYMMETRIC 10X35 (Knees) ×2 IMPLANT
KNEE TIBIAL COMPONENT SZ5 (Knees) ×2 IMPLANT
MANIFOLD NEPTUNE II (INSTRUMENTS) ×2 IMPLANT
NEEDLE 22X1 1/2 (OR ONLY) (NEEDLE) ×2 IMPLANT
NEEDLE SPNL 18GX3.5 QUINCKE PK (NEEDLE) ×2 IMPLANT
NS IRRIG 1000ML POUR BTL (IV SOLUTION) ×4 IMPLANT
PACK TOTAL JOINT (CUSTOM PROCEDURE TRAY) ×2 IMPLANT
PAD ARMBOARD 7.5X6 YLW CONV (MISCELLANEOUS) ×4 IMPLANT
PAD CAST 4YDX4 CTTN HI CHSV (CAST SUPPLIES) IMPLANT
PAD COLD SHLDR WRAP-ON (PAD) ×2 IMPLANT
PADDING CAST COTTON 4X4 STRL (CAST SUPPLIES)
PADDING CAST COTTON 6X4 STRL (CAST SUPPLIES) IMPLANT
PIN FLUTED HEDLESS FIX 3.5X1/8 (PIN) ×2 IMPLANT
SET HNDPC FAN SPRY TIP SCT (DISPOSABLE) ×1 IMPLANT
SPONGE T-LAP 18X18 ~~LOC~~+RFID (SPONGE) ×8 IMPLANT
STRIP CLOSURE SKIN 1/2X4 (GAUZE/BANDAGES/DRESSINGS) ×2 IMPLANT
SUCTION FRAZIER HANDLE 10FR (MISCELLANEOUS) ×2
SUCTION TUBE FRAZIER 10FR DISP (MISCELLANEOUS) ×1 IMPLANT
SUT MNCRL AB 3-0 PS2 18 (SUTURE) ×2 IMPLANT
SUT VIC AB 0 CT1 27 (SUTURE) ×6
SUT VIC AB 0 CT1 27XBRD ANBCTR (SUTURE) ×3 IMPLANT
SUT VIC AB 1 CT1 36 (SUTURE) ×10 IMPLANT
SUT VIC AB 2-0 CT1 27 (SUTURE) ×8
SUT VIC AB 2-0 CT1 TAPERPNT 27 (SUTURE) ×4 IMPLANT
SYR 30ML LL (SYRINGE) ×6 IMPLANT
SYR TB 1ML LUER SLIP (SYRINGE) ×2 IMPLANT
TOWEL GREEN STERILE (TOWEL DISPOSABLE) ×4 IMPLANT
TOWEL GREEN STERILE FF (TOWEL DISPOSABLE) ×4 IMPLANT
TRAY CATH 16FR W/PLASTIC CATH (SET/KITS/TRAYS/PACK) IMPLANT
WATER STERILE IRR 1000ML POUR (IV SOLUTION) IMPLANT
YANKAUER SUCT BULB TIP NO VENT (SUCTIONS) ×2 IMPLANT

## 2021-02-10 NOTE — Anesthesia Postprocedure Evaluation (Signed)
Anesthesia Post Note  Patient: [name]  Procedure(s) Performed: LEFT TOTAL KNEE ARTHROPLASTY (Left: Knee)     Patient location during evaluation: PACU Anesthesia Type: Spinal and Regional Level of consciousness: awake Pain management: pain level controlled Vital Signs Assessment: post-procedure vital signs reviewed and stable Respiratory status: spontaneous breathing, respiratory function stable and patient connected to nasal cannula oxygen Cardiovascular status: blood pressure returned to baseline and stable Postop Assessment: no headache, no backache and no apparent nausea or vomiting Anesthetic complications: no   No notable events documented.  Last Vitals:  Vitals:   02/10/21 1950 02/10/21 2006  BP: (!) 151/92 (!) 151/98  Pulse: 75 71  Resp: 15 16  Temp:  36.5 C  SpO2: 96% 97%    Last Pain:  Vitals:   02/10/21 2025  TempSrc:   PainSc: 4                  Bernarda Erck P Kayleana Waites

## 2021-02-10 NOTE — Plan of Care (Signed)
  Problem: Education: Goal: Knowledge of General Education information will improve Description: Including pain rating scale, medication(s)/side effects and non-pharmacologic comfort measures Outcome: Progressing   Problem: Health Behavior/Discharge Planning: Goal: Ability to manage health-related needs will improve Outcome: Progressing   Problem: Clinical Measurements: Goal: Ability to maintain clinical measurements within normal limits will improve Outcome: Progressing Goal: Will remain free from infection Outcome: Progressing Goal: Diagnostic test results will improve Outcome: Progressing   Problem: Activity: Goal: Risk for activity intolerance will decrease Outcome: Progressing   Problem: Nutrition: Goal: Adequate nutrition will be maintained Outcome: Progressing   Problem: Elimination: Goal: Will not experience complications related to bowel motility Outcome: Progressing Goal: Will not experience complications related to urinary retention Outcome: Progressing   Problem: Pain Managment: Goal: General experience of comfort will improve Outcome: Progressing   Problem: Safety: Goal: Ability to remain free from injury will improve Outcome: Progressing   Problem: Skin Integrity: Goal: Risk for impaired skin integrity will decrease Outcome: Progressing   Problem: Activity: Goal: Range of joint motion will improve Outcome: Progressing   Problem: Clinical Measurements: Goal: Postoperative complications will be avoided or minimized Outcome: Progressing   Problem: Skin Integrity: Goal: Will show signs of wound healing Outcome: Progressing   Report received and care assumed from recovery room RN. Progressing towards goals as outlined above. VS obtained, shift/admission assessments completed - see flowsheets. PRN pain medications given as needed for c/o L knee surgical pain - see MAR; on call ortho provider called as patient c/o 10/10 severe pain to L knee despite all  pain medication interventions and ice - orders received to increase frequency of dilaudid IV and for x1 dose of oxycontin 10mg  (see MAR). L knee dressing remains CDI. Iceman in place to surgical site. Currently resting in bed, bed in lowest position. Denies needs. Call bell within reach.

## 2021-02-10 NOTE — Transfer of Care (Signed)
Immediate Anesthesia Transfer of Care Note  Patient: [name]  Procedure(s) Performed: LEFT TOTAL KNEE ARTHROPLASTY (Left: Knee)  Patient Location: PACU  Anesthesia Type:General and MAC combined with regional for post-op pain  Level of Consciousness: drowsy and patient cooperative  Airway & Oxygen Therapy: Patient Spontanous Breathing  Post-op Assessment: Report given to RN and Post -op Vital signs reviewed and stable  Post vital signs: Reviewed and stable  Last Vitals:  Vitals Value Taken Time  BP    Temp    Pulse 70 02/10/21 1833  Resp 15 02/10/21 1833  SpO2 95 % 02/10/21 1833  Vitals shown include unvalidated device data.  Last Pain:  Vitals:   02/10/21 1203  TempSrc:   PainSc: 8       Patients Stated Pain Goal: 5 (38/75/64 3329)  Complications: No notable events documented.

## 2021-02-10 NOTE — H&P (Signed)
TOTAL KNEE ADMISSION H&P  Patient is being admitted for left total knee arthroplasty.  Subjective:  Chief Complaint:left knee pain.  HPI: [name], 55 y.o. male, has a history of pain and functional disability in the left knee due to arthritis and has failed non-surgical conservative treatments for greater than 12 weeks to includeNSAID's and/or analgesics, corticosteriod injections, viscosupplementation injections, flexibility and strengthening excercises, and activity modification.  Onset of symptoms was gradual, starting >10 years ago with gradually worsening course since that time. The patient noted prior procedures on the knee to include  arthroscopy and menisectomy on the left knee(s).  Patient currently rates pain in the left knee(s) at 9 out of 10 with activity. Patient has night pain, worsening of pain with activity and weight bearing, pain that interferes with activities of daily living, pain with passive range of motion, crepitus, and joint swelling.  Patient has evidence of subchondral sclerosis and joint space narrowing by imaging studies. This patient has had  a good result with his right total knee revision.  He understands the rehabilitation requirements for knee replacement. . There is no active infection.  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 ([place]) 08/26/2019   Radiculopathy, cervical region 06/10/2019   Carpal tunnel syndrome, left  upper limb 06/10/2019   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   Spinal stenosis of cervical region 12/31/2018   Loose body of left knee    Plantar fasciitis of left foot 08/24/2017   Chronic pain of both hips 06/20/2017   Complete rotator cuff tear of left shoulder 09/14/2015   Hypothyroidism due to acquired atrophy of thyroid 09/14/2015   Arthritis of knee 06/23/2014   Encounter for preventive health examination 06/17/2014   Enlarged RV (right ventricle) 06/17/2014   GERD (gastroesophageal reflux disease) 04/20/2014   Cervical spine degeneration 02/17/2014   Overweight 10/29/2013   Chronic knee pain 10/29/2013   Type 2 diabetes mellitus with diabetic neuropathy, unspecified ([place]) 10/23/2012   Seborrheic dermatitis, unspecified 05/17/2012   Fatty liver 04/22/2012   Hypogonadism male 03/12/2012   Chronic pain of multiple joints 12/24/2010   Sciatica of left side associated with disorder of lumbar spine 12/24/2010   Hyperlipidemia associated with type 2 diabetes mellitus ([place]) 01/13/2009   Anxiety state 01/13/2009   Essential hypertension 01/13/2009   OSA (obstructive sleep apnea) 01/13/2009   Past Medical History:  Diagnosis Date   Acute medial meniscus tear of left knee    Alcohol-induced chronic pancreatitis ([place]) 11/20/2016   Anxiety    Anxiety and depression    Arthritis    knees and shoulders   Benzodiazepine withdrawal with complication ([place]) 38/01/1750   Bruit    L   Chest pain    hx   Cognitive complaints with normal neuropsychological exam 10/23/2016   Diabetes mellitus without complication ([place])    "borderline", diet controlled, no meds, patient has lost 30 lbs   Edema    Fatty liver    GERD (gastroesophageal reflux disease)    uses Omeprazole   Goiter  HLD (hyperlipidemia)    Hypertension    essential, benign   Hypothyroidism    Impotence of organic origin    Murmur    never has caused any problems   Neuromuscular disorder ([place])    Other chest pain     tightness, pressure   Palpitation    hx   Precordial pain    Sleep apnea    uses cpap    Past Surgical History:  Procedure Laterality Date   APPENDECTOMY  2002   done at [facility] 08/27/2015   Procedure: Left Heart Cath and Coronary Angiography;  Surgeon: Jolaine Artist, MD;  Location: [facility];  Service: Cardiovascular;  Laterality: N/A;   COLONOSCOPY  in his 20's   EAR CYST EXCISION Left 07/08/2019   Procedure: open excision baker's cyst left knee;  Surgeon: Meredith Pel, MD;  Location: [facility];  Service: Orthopedics;  Laterality: Left;   HERNIA REPAIR  9-44-96   umbilical   JOINT REPLACEMENT     right knee   KNEE ARTHROSCOPY Left 12/17/2018   Procedure: left knee arthroscopy, meniscal debridement, loose body removal;  Surgeon: Meredith Pel, MD;  Location: [facility];  Service: Orthopedics;  Laterality: Left;   REVISION TOTAL KNEE ARTHROPLASTY Right 06/23/2014   DR Ammon Muscatello   TONSILLECTOMY     TOTAL KNEE ARTHROPLASTY Right 2011   right   TOTAL KNEE REVISION Right 06/23/2014   Procedure: TOTAL KNEE REVISION;  Surgeon: Meredith Pel, MD;  Location: [facility];  Service: Orthopedics;  Laterality: Right;   UPPER GASTROINTESTINAL ENDOSCOPY  04/30/2013   Judieth Keens TOOTH EXTRACTION      Current Facility-Administered Medications  Medication Dose Route Frequency Provider Last Rate Last Admin   ceFAZolin (ANCEF) IVPB 2g/100 mL premix  2 g Intravenous On Call to OR Magnant, Gerrianne Scale, PA-C       chlorhexidine (PERIDEX) 0.12 % solution 15 mL  15 mL Mouth/Throat Once Roderic Palau, MD       Or   MEDLINE mouth rinse  15 mL Mouth Rinse Once Roderic Palau, MD       fentaNYL (SUBLIMAZE) 100 MCG/2ML injection            lactated ringers infusion   Intravenous Continuous Roderic Palau, MD       midazolam (VERSED) 2 MG/2ML injection            povidone-iodine (BETADINE) 7.5 % scrub   Topical Once  Magnant, [name] L, PA-C       povidone-iodine 10 % swab 2 application  2 application Topical Once Magnant, [name] L, PA-C       povidone-iodine 10 % swab 2 application  2 application Topical Once Magnant, [name] L, PA-C       tranexamic acid (CYKLOKAPRON) 2,000 mg in sodium chloride 0.9 % 50 mL Topical Application  7,591 mg Topical To OR Meredith Pel, MD       tranexamic acid (CYKLOKAPRON) IVPB 1,000 mg  1,000 mg Intravenous To OR Magnant, [name] L, PA-C       Allergies  Allergen Reactions   Ambien [Zolpidem] Swelling and Other (See Comments)    Swelling in the throat    Social History   Tobacco Use   Smoking status: Former    Packs/day: 2.00    Years: 18.00    Pack years: 36.00    Types: Cigarettes    Quit  date: 04/24/2005    Years since quitting: 15.8   Smokeless tobacco: Never   Tobacco comments:    quit 20 years ago   Substance Use Topics   Alcohol use: Yes    Alcohol/week: 4.0 standard drinks    Types: 4 Cans of beer per week    Family History  Problem Relation Age of Onset   Heart disease Father    Colon cancer Neg Hx    Colon polyps Neg Hx    Esophageal cancer Neg Hx    Rectal cancer Neg Hx    Stomach cancer Neg Hx      Review of Systems  Musculoskeletal:  Positive for arthralgias.  All other systems reviewed and are negative.  Objective:  Physical Exam Vitals reviewed.  HENT:     Head: Normocephalic.     Nose: Nose normal.     Mouth/Throat:     Mouth: Mucous membranes are moist.  Eyes:     Pupils: Pupils are equal, round, and reactive to light.  Cardiovascular:     Rate and Rhythm: Normal rate.     Pulses: Normal pulses.  Pulmonary:     Effort: Pulmonary effort is normal.  Abdominal:     General: Abdomen is flat.  Musculoskeletal:     Cervical back: Normal range of motion.  Skin:    General: Skin is warm.     Capillary Refill: Capillary refill takes less than 2 seconds.  Neurological:     General: No focal deficit present.      Mental Status: He is alert.  Psychiatric:        Mood and Affect: Mood normal.    Vital signs in last 24 hours: Temp:  [98.9 F (37.2 C)] 98.9 F (37.2 C) (10/20 1151) Pulse Rate:  [69] 69 (10/20 1151) Resp:  [18] 18 (10/20 1151) BP: (171)/(103) 171/103 (10/20 1151) SpO2:  [97 %] 97 % (10/20 1151) Weight:  [104.8 kg] 104.8 kg (10/20 1151)  Labs:   Estimated body mass index is 31.33 kg/m as calculated from the following:   Height as of this encounter: 6' (1.829 m).   Weight as of this encounter: 104.8 kg.   Imaging Review Plain radiographs demonstrate severe degenerative joint disease of the left knee(s). The overall alignment ismild varus. The bone quality appears to be good for age and reported activity level.      Assessment/Plan:  End stage arthritis, left knee   The patient history, physical examination, clinical judgment of the provider and imaging studies are consistent with end stage degenerative joint disease of the left knee(s) and total knee arthroplasty is deemed medically necessary. The treatment options including medical management, injection therapy arthroscopy and arthroplasty were discussed at length. The risks and benefits of total knee arthroplasty were presented and reviewed. The risks due to aseptic loosening, infection, stiffness, patella tracking problems, thromboembolic complications and other imponderables were discussed. The patient acknowledged the explanation, agreed to proceed with the plan and consent was signed. Patient is being admitted for inpatient treatment for surgery, pain control, PT, OT, prophylactic antibiotics, VTE prophylaxis, progressive ambulation and ADL's and discharge planning. The patient is planning to be discharged home with home health services     Patient's anticipated LOS is less than 2 midnights, meeting these requirements: - Younger than 18 - Lives within 1 hour of care - Has a competent adult at home to recover with  post-op recover - NO history of  - Chronic pain requiring opiods  -  Diabetes  - Coronary Artery Disease  - Heart failure  - Heart attack  - Stroke  - DVT/VTE  - Cardiac arrhythmia  - Respiratory Failure/COPD  - Renal failure  - Anemia  - Advanced Liver disease

## 2021-02-10 NOTE — Anesthesia Procedure Notes (Signed)
Anesthesia Regional Block: Adductor canal block   Pre-Anesthetic Checklist: , timeout performed,  Correct Patient, Correct Site, Correct Laterality,  Correct Procedure, Correct Position, site marked,  Risks and benefits discussed,  Surgical consent,  Pre-op evaluation,  At surgeon's request and post-op pain management  Laterality: Left and Lower  Prep: chloraprep       Needles:  Injection technique: Single-shot  Needle Type: Echogenic Needle     Needle Length: 9cm  Needle Gauge: 21     Additional Needles:   Procedures:,,,, ultrasound used (permanent image in chart),,    Narrative:  Start time: 02/10/2021 1:58 PM End time: 02/10/2021 2:04 PM Injection made incrementally with aspirations every 5 mL.  Performed by: Personally  Anesthesiologist: Annye Asa, MD  Additional Notes: Pt identified in Holding room.  Monitors applied. Working IV access confirmed. Sterile prep L thigh.  #21ga ECHOgenic Arrow block needle into adductor canal with US guidance.  25cc 0.5% Ropivacaine injected incrementally after negative test dose.  Patient asymptomatic, VSS, no heme aspirated, tolerated well.   Jenita Seashore, MD

## 2021-02-10 NOTE — Anesthesia Procedure Notes (Signed)
Spinal  Patient location during procedure: OR Start time: 02/10/2021 3:20 PM End time: 02/10/2021 3:25 PM Reason for block: surgical anesthesia Staffing Performed: anesthesiologist  Anesthesiologist: Roderic Palau, MD Preanesthetic Checklist Completed: patient identified, IV checked, risks and benefits discussed, surgical consent, monitors and equipment checked, pre-op evaluation and timeout performed Spinal Block Patient position: sitting Prep: DuraPrep Patient monitoring: cardiac monitor, continuous pulse ox and blood pressure Approach: midline Location: L3-4 Injection technique: single-shot Needle Needle type: Pencan  Needle gauge: 24 G Needle length: 9 cm Assessment Sensory level: T8 Events: CSF return Additional Notes Functioning IV was confirmed and monitors were applied. Sterile prep and drape, including hand hygiene and sterile gloves were used. The patient was positioned and the spine was prepped. The skin was anesthetized with lidocaine.  Free flow of clear CSF was obtained prior to injecting local anesthetic into the CSF.  The spinal needle aspirated freely following injection.  The needle was carefully withdrawn.  The patient tolerated the procedure well.

## 2021-02-11 ENCOUNTER — Other Ambulatory Visit: Payer: Self-pay

## 2021-02-11 ENCOUNTER — Encounter (HOSPITAL_COMMUNITY): Payer: Self-pay | Admitting: Orthopedic Surgery

## 2021-02-11 DIAGNOSIS — Z79899 Other long term (current) drug therapy: Secondary | ICD-10-CM | POA: Diagnosis not present

## 2021-02-11 DIAGNOSIS — I10 Essential (primary) hypertension: Secondary | ICD-10-CM | POA: Diagnosis not present

## 2021-02-11 DIAGNOSIS — G8929 Other chronic pain: Secondary | ICD-10-CM | POA: Diagnosis not present

## 2021-02-11 DIAGNOSIS — Z96651 Presence of right artificial knee joint: Secondary | ICD-10-CM | POA: Diagnosis not present

## 2021-02-11 DIAGNOSIS — M1712 Unilateral primary osteoarthritis, left knee: Secondary | ICD-10-CM | POA: Diagnosis not present

## 2021-02-11 DIAGNOSIS — E039 Hypothyroidism, unspecified: Secondary | ICD-10-CM | POA: Diagnosis not present

## 2021-02-11 DIAGNOSIS — E119 Type 2 diabetes mellitus without complications: Secondary | ICD-10-CM | POA: Diagnosis not present

## 2021-02-11 DIAGNOSIS — Z87891 Personal history of nicotine dependence: Secondary | ICD-10-CM | POA: Diagnosis not present

## 2021-02-11 DIAGNOSIS — Z23 Encounter for immunization: Secondary | ICD-10-CM | POA: Diagnosis not present

## 2021-02-11 LAB — CBC
HCT: 38.7 % — ABNORMAL LOW (ref 39.0–52.0)
Hemoglobin: 13.3 g/dL (ref 13.0–17.0)
MCH: 32.8 pg (ref 26.0–34.0)
MCHC: 34.4 g/dL (ref 30.0–36.0)
MCV: 95.3 fL (ref 80.0–100.0)
Platelets: 213 10*3/uL (ref 150–400)
RBC: 4.06 MIL/uL — ABNORMAL LOW (ref 4.22–5.81)
RDW: 12.9 % (ref 11.5–15.5)
WBC: 13.8 10*3/uL — ABNORMAL HIGH (ref 4.0–10.5)
nRBC: 0 % (ref 0.0–0.2)

## 2021-02-11 LAB — BASIC METABOLIC PANEL
Anion gap: 6 (ref 5–15)
BUN: 9 mg/dL (ref 6–20)
CO2: 31 mmol/L (ref 22–32)
Calcium: 8.3 mg/dL — ABNORMAL LOW (ref 8.9–10.3)
Chloride: 99 mmol/L (ref 98–111)
Creatinine, Ser: 0.9 mg/dL (ref 0.61–1.24)
GFR, Estimated: >60 mL/min (ref >60)
Glucose, Bld: 147 mg/dL — ABNORMAL HIGH (ref 70–99)
Potassium: 3.7 mmol/L (ref 3.5–5.1)
Sodium: 136 mmol/L (ref 135–145)

## 2021-02-11 MED ORDER — HYDROMORPHONE HCL 1 MG/ML IJ SOLN
0.5000 mg | INTRAMUSCULAR | Status: DC | PRN
  Administered 2021-02-11 – 2021-02-12 (×10): 0.5 mg via INTRAVENOUS
  Filled 2021-02-11 (×9): qty 0.5

## 2021-02-11 MED ORDER — OXYCODONE HCL ER 10 MG PO T12A
10.0000 mg | EXTENDED_RELEASE_TABLET | Freq: Once | ORAL | Status: AC
Start: 2021-02-11 — End: 2021-02-11
  Administered 2021-02-11: 10 mg via ORAL
  Filled 2021-02-11: qty 1

## 2021-02-11 NOTE — Plan of Care (Signed)
  Problem: Education: Goal: Knowledge of General Education information will improve Description: Including pain rating scale, medication(s)/side effects and non-pharmacologic comfort measures Outcome: Progressing   Problem: Health Behavior/Discharge Planning: Goal: Ability to manage health-related needs will improve Outcome: Progressing   Problem: Clinical Measurements: Goal: Ability to maintain clinical measurements within normal limits will improve Outcome: Progressing Goal: Will remain free from infection Outcome: Progressing Goal: Diagnostic test results will improve Outcome: Progressing   Problem: Activity: Goal: Risk for activity intolerance will decrease Outcome: Progressing   Problem: Nutrition: Goal: Adequate nutrition will be maintained Outcome: Progressing   Problem: Elimination: Goal: Will not experience complications related to bowel motility Outcome: Progressing Goal: Will not experience complications related to urinary retention Outcome: Progressing   Problem: Pain Managment: Goal: General experience of comfort will improve Outcome: Progressing   Problem: Safety: Goal: Ability to remain free from injury will improve Outcome: Progressing   Problem: Skin Integrity: Goal: Risk for impaired skin integrity will decrease Outcome: Progressing   Problem: Activity: Goal: Range of joint motion will improve Outcome: Progressing   Problem: Clinical Measurements: Goal: Postoperative complications will be avoided or minimized Outcome: Progressing   Problem: Skin Integrity: Goal: Will show signs of wound healing Outcome: Progressing   Report received and care assumed from previous shift RN. Progressing towards goals as outlined above. VS obtained, shift assessments completed - see flowsheets. PRN pain medications given as needed for c/o surgical pain to L knee - see MAR. Turns and repositions self in bed. OOB with standby assist and walker, tolerating activity  well maintaining WBAT status to LLE; ambulated in hallway x1 this shift. L knee dressing CDI. Ortho tech called and on floor to deliver CPM and bone foam. Iceman machine in place to LLE. Patient currently resting in bed, bed in lowest position. Denies needs. Call bell within reach.

## 2021-02-11 NOTE — Progress Notes (Signed)
  Subjective: Patient is a 55 year old male who is POD 1 s/p left total knee arthroplasty.  Doing well overall.  Pain is controlled but moderate.  He denies any chest pain, shortness of breath, other complaints.  He did well with physical therapy this morning and was able to ambulate about 50 feet according to him.  He would like to be discharged tomorrow as he lives in a camper and he wants some more therapy in order to optimize his mobility because spaces cramped in his camper.   Objective: Vital signs in last 24 hours: Temp:  [97.7 F (36.5 C)-99.6 F (37.6 C)] 99.6 F (37.6 C) (10/21 1139) Pulse Rate:  [65-99] 99 (10/21 1139) Resp:  [12-16] 15 (10/21 0748) BP: (121-160)/(79-102) 121/79 (10/21 1139) SpO2:  [90 %-97 %] 90 % (10/21 1139) FiO2 (%):  [24 %] 24 % (10/20 1950) Weight:  [105 kg] 105 kg (10/20 2006)  Intake/Output from previous day: 10/20 0701 - 10/21 0700 In: 3649 [P.O.:720; I.V.:2829; IV Piggyback:100] Out: 8338 [Urine:1700; Blood:50] Intake/Output this shift: No intake/output data recorded.  Exam:  2+ DP pulse of the operative extremity No gross blood or drainage overlying the dressing Able to perform straight leg raise with 5 degree extensor lag and with great effort Mild calf tenderness but not out of proportion to postop Negative Homans' sign No significant erythema surrounding the incision.  Small postop effusion of the operative knee.  Labs: Recent Labs    02/11/21 0243  HGB 13.3   Recent Labs    02/11/21 0243  WBC 13.8*  RBC 4.06*  HCT 38.7*  PLT 213   Recent Labs    02/11/21 0243  NA 136  K 3.7  CL 99  CO2 31  BUN 9  CREATININE 0.90  GLUCOSE 147*  CALCIUM 8.3*   No results for input(s): LABPT, INR in the last 72 hours.  Assessment/Plan: Patient is a 55 year old male who is POD 1 s/p left total knee arthroplasty.  Doing well overall.  Performed well with PT today.  Plan for weightbearing as tolerated with a walker.  He lives in a  camper and plan for discharge home tomorrow after 1-2 sessions of therapy.  Patient agreed with this plan.  He is able to perform straight leg raise today.  No sign of infection or significant blood loss from procedure.   Landry Dyke Yavonne Kiss 02/11/2021, 11:51 AM

## 2021-02-11 NOTE — Brief Op Note (Addendum)
   02/10/2021  6:59 AM  PATIENT:  [name]  55 y.o. male  PRE-OPERATIVE DIAGNOSIS:  LEFT KNEE OSTEOARTHRITIS  POST-OPERATIVE DIAGNOSIS:  LEFT KNEE OSTEOARTHRITIS  PROCEDURE:  Procedure(s): LEFT TOTAL KNEE ARTHROPLASTY  SURGEON:  Surgeon(s): Meredith Pel, MD  ASSISTANT: magnant pa  ANESTHESIA:   spinal  EBL: 100 ml    Total I/O In: 1368 [P.O.:720; I.V.:729; IV Piggyback:100] Out: 700 [Urine:700]  BLOOD ADMINISTERED: none  DRAINS: none   LOCAL MEDICATIONS USED:  marcaine mso4 clonidine exparel vanco SPECIMEN:  No Specimen  COUNTS:  YES  TOURNIQUET:   Total Tourniquet Time Documented: Thigh (Left) - 78 minutes Total: Thigh (Left) - 78 minutes   DICTATION: .Other Dictation: Dictation Number pending [id number]  PLAN OF CARE: Admit for overnight observation  PATIENT DISPOSITION:  PACU - hemodynamically stable

## 2021-02-11 NOTE — Progress Notes (Signed)
Orthopedic Tech Progress Note Patient Details:  [name] Jul 01, 1965 [id number] Patient did not want to get in CPM tonight asked could he do it in the morning. Left CPM in room and placed patient in bonefoam Ortho Devices Type of Ortho Device: Bone foam zero knee Ortho Device/Splint Location: LLE Ortho Device/Splint Interventions: Ordered, Application   Post Interventions Patient Tolerated: Well Instructions Provided: Care of device, Poper ambulation with device  [name] 02/11/2021, 11:54 PM

## 2021-02-11 NOTE — Progress Notes (Signed)
02/11/21 1400  PT Visit Information  Last PT Received On 02/11/21  Assistance Needed +1  History of Present Illness Patient is a 55 year old male admitted 10/20 s/p left total knee arthroplasty. PMH:  anxiety, depression, GERD, HTN, right TKA, DM, CAD, OSA, Benzo withdrawal  Subjective Data  Patient Stated Goal to go home  Precautions  Precautions Fall;Knee  Required Braces or Orthoses Knee Immobilizer - Left (MD told pt he could remove the immobilizer)  Restrictions  LLE Weight Bearing WBAT  Pain Assessment  Pain Assessment Faces  Faces Pain Scale 8  Pain Location left knee  Pain Descriptors / Indicators Aching;Grimacing;Guarding  Pain Intervention(s) Limited activity within patient's tolerance;Monitored during session;Repositioned;Premedicated before session;Ice applied  Cognition  Arousal/Alertness Awake/alert  Behavior During Therapy WFL for tasks assessed/performed  Overall Cognitive Status Within Functional Limits for tasks assessed  Bed Mobility  Overal bed mobility Needs Assistance  Bed Mobility Sit to Supine  Supine to sit Min guard  General bed mobility comments did not need help to get back into bed. Cued him to use his right LE under his left to get into bed.  Transfers  Overall transfer level Needs assistance  Equipment used Rolling walker (2 wheeled)  Transfers Sit to/from Stand  Sit to Stand Min guard  General transfer comment cues for technique and hand placement.  Ambulation/Gait  Ambulation/Gait assistance Min guard  Gait Distance (Feet) 200 Feet  Assistive device Rolling walker (2 wheeled)  Gait Pattern/deviations Antalgic;Drifts right/left;Step-to pattern;Decreased stance time - left;Decreased stride length;Decreased weight shift to left  General Gait Details Pt needed cues to sequence steps and RW. Also cues to unweight the left LE at times for comfort.  Gait velocity interpretation <1.31 ft/sec, indicative of household ambulator  Balance  Overall  balance assessment Needs assistance  Sitting-balance support No upper extremity supported;Feet supported  Sitting balance-Leahy Scale Fair  Standing balance support Bilateral upper extremity supported;During functional activity  Standing balance-Leahy Scale Poor  Standing balance comment relies on UE support for balance  Exercises  Exercises General Lower Extremity;Total Joint  Total Joint Exercises  Ankle Circles/Pumps AROM;Both;10 reps;Supine  Quad Sets AROM;Both;10 reps;Supine  Straight Leg Raises AAROM;Both;10 reps;Supine  Long Arc Bock;Left;5 reps;Seated  Knee Flexion AAROM;Left;5 reps;Seated  Goniometric ROM 3-75  PT - End of Session  Equipment Utilized During Treatment Gait belt (MD said pt can remove the immobilizer)  Activity Tolerance Patient limited by fatigue;Patient limited by pain  Patient left with call bell/phone within reach;in bed  Nurse Communication Mobility status;Weight bearing status   PT - Assessment/Plan  PT Plan Current plan remains appropriate  PT Visit Diagnosis Unsteadiness on feet (R26.81);Muscle weakness (generalized) (M62.81);Pain  Pain - Right/Left Left  Pain - part of body Knee  PT Frequency (ACUTE ONLY) 7X/week  Follow Up Recommendations Follow surgeon's recommendation for DC plan and follow-up therapies  PT equipment None recommended by PT  AM-PAC PT "6 Clicks" Mobility Outcome Measure (Version 2)  Help needed turning from your back to your side while in a flat bed without using bedrails? 4  Help needed moving from lying on your back to sitting on the side of a flat bed without using bedrails? 4  Help needed moving to and from a bed to a chair (including a wheelchair)? 3  Help needed standing up from a chair using your arms (e.g., wheelchair or bedside chair)? 3  Help needed to walk in hospital room? 3  Help needed climbing 3-5 steps with a railing?  2  6 Click Score 19  Consider Recommendation of Discharge To: Home with [facility]  Progressive  Mobility  What is the highest level of mobility based on the progressive mobility assessment? Level 5 (Walks with assist in room/hall) - Balance while stepping forward/back and can walk in room with assist - Complete  Mobility Ambulated with assistance in hallway  PT Goal Progression  Progress towards PT goals Progressing toward goals  PT Time Calculation  PT Start Time (ACUTE ONLY) 1307  PT Stop Time (ACUTE ONLY) 1326  PT Time Calculation (min) (ACUTE ONLY) 19 min  PT General Charges  $$ ACUTE PT VISIT 1 Visit  PT Treatments  $Gait Training 8-22 mins  Pt progressing well. Able to incr distance this pm. Does have incr pain and nurse brought pain meds.  Wilma Michaelson M,PT Acute Rehab Services [phone] (pager)

## 2021-02-11 NOTE — Evaluation (Signed)
Physical Therapy Evaluation Patient Details Name: [name] MRN: [id number] DOB: 1966/04/09 Today's Date: 02/11/2021  History of Present Illness  Patient is a 55 year old male admitted 10/20 s/p left total knee arthroplasty. PMH:  anxiety, depression, GERD, HTN, right TKA, DM, CAD, OSA, Benzo withdrawal  Clinical Impression  Pt admitted with above diagnosis. Pt was able to ambulate with RW with good technique overall with cues and min guard assist. Pt in pain but is progressing and should progress well.   Pt currently with functional limitations due to the deficits listed below (see PT Problem List). Pt will benefit from skilled PT to increase their independence and safety with mobility to allow discharge to the venue listed below.          Recommendations for follow up therapy are one component of a multi-disciplinary discharge planning process, led by the attending physician.  Recommendations may be updated based on patient status, additional functional criteria and insurance authorization.  Follow Up Recommendations Follow surgeon's recommendation for DC plan and follow-up therapies    Equipment Recommendations  None recommended by PT    Recommendations for Other Services       Precautions / Restrictions Precautions Precautions: Fall;Knee Restrictions Weight Bearing Restrictions: Yes LLE Weight Bearing: Weight bearing as tolerated      Mobility  Bed Mobility Overal bed mobility: Needs Assistance Bed Mobility: Supine to Sit     Supine to sit: Min guard     General bed mobility comments: Able to come to eOB without assist    Transfers Overall transfer level: Needs assistance Equipment used: Rolling walker (2 wheeled) Transfers: Sit to/from Stand Sit to Stand: Min guard         General transfer comment: cues for technique and hand placement.  Ambulation/Gait Ambulation/Gait assistance: Min guard Gait Distance (Feet): 80 Feet Assistive device: Rolling  walker (2 wheeled) Gait Pattern/deviations: Antalgic;Drifts right/left;Step-to pattern;Decreased stance time - left;Decreased stride length;Decreased weight shift to left   Gait velocity interpretation: <1.31 ft/sec, indicative of household ambulator General Gait Details: Pt needed cues to sequence steps and RW.  Stairs            Wheelchair Mobility    Modified Rankin (Stroke Patients Only)       Balance Overall balance assessment: Needs assistance Sitting-balance support: No upper extremity supported;Feet supported Sitting balance-Leahy Scale: Fair     Standing balance support: Bilateral upper extremity supported;During functional activity Standing balance-Leahy Scale: Poor Standing balance comment: relies on UE support for balance                             Pertinent Vitals/Pain Pain Assessment: Faces Faces Pain Scale: Hurts whole lot Pain Location: left knee Pain Descriptors / Indicators: Aching;Grimacing;Guarding Pain Intervention(s): Limited activity within patient's tolerance;Monitored during session;Repositioned;Premedicated before session;Ice applied    Home Living Family/patient expects to be discharged to:: Private residence Living Arrangements: Alone Available Help at Discharge: Family;Available 24 hours/day (friends and neighbors can assist) Type of Home:  (camper) Home Access: Stairs to enter Entrance Stairs-Rails: Right (bar) Entrance Stairs-Number of Steps: 3 Home Layout: Two level Home Equipment: Toilet riser;Walker - 2 wheels;Cane - single point Additional Comments: has recliner that he may sleep in    Prior Function Level of Independence: Independent with assistive device(s)         Comments: used cane PTA     Hand Dominance   Dominant Hand: Right    Extremity/Trunk Assessment  Upper Extremity Assessment Upper Extremity Assessment: Defer to OT evaluation    Lower Extremity Assessment Lower Extremity Assessment: LLE  deficits/detail LLE: Unable to fully assess due to pain    Cervical / Trunk Assessment Cervical / Trunk Assessment: Normal  Communication   Communication: No difficulties  Cognition Arousal/Alertness: Awake/alert Behavior During Therapy: WFL for tasks assessed/performed Overall Cognitive Status: Within Functional Limits for tasks assessed                                        General Comments      Exercises Total Joint Exercises Ankle Circles/Pumps: AROM;Both;10 reps;Supine Quad Sets: AROM;Both;10 reps;Supine Straight Leg Raises: AAROM;Both;10 reps;Supine   Assessment/Plan    PT Assessment Patient needs continued PT services  PT Problem List Decreased balance;Decreased activity tolerance;Decreased strength;Decreased range of motion;Decreased mobility;Decreased knowledge of use of DME;Decreased safety awareness;Decreased knowledge of precautions;Pain       PT Treatment Interventions DME instruction;Gait training;Functional mobility training;Stair training;Therapeutic activities;Therapeutic exercise;Balance training;Patient/family education    PT Goals (Current goals can be found in the Care Plan section)  Acute Rehab PT Goals Patient Stated Goal: to go home PT Goal Formulation: With patient Time For Goal Achievement: 02/18/21 Potential to Achieve Goals: Good    Frequency 7X/week   Barriers to discharge Decreased caregiver support      Co-evaluation               AM-PAC PT "6 Clicks" Mobility  Outcome Measure Help needed turning from your back to your side while in a flat bed without using bedrails?: None Help needed moving from lying on your back to sitting on the side of a flat bed without using bedrails?: None Help needed moving to and from a bed to a chair (including a wheelchair)?: A Little Help needed standing up from a chair using your arms (e.g., wheelchair or bedside chair)?: A Little Help needed to walk in hospital room?: A  Little Help needed climbing 3-5 steps with a railing? : A Lot 6 Click Score: 19    End of Session Equipment Utilized During Treatment: Gait belt;Left knee immobilizer Activity Tolerance: Patient limited by fatigue;Patient limited by pain Patient left: in chair;with call bell/phone within reach;with chair alarm set Nurse Communication: Mobility status;Weight bearing status PT Visit Diagnosis: Unsteadiness on feet (R26.81);Muscle weakness (generalized) (M62.81);Pain Pain - Right/Left: Left Pain - part of body: Knee    Time: 5681-2751 PT Time Calculation (min) (ACUTE ONLY): 40 min   Charges:   PT Evaluation $PT Eval Moderate Complexity: 1 Mod PT Treatments $Gait Training: 8-22 mins $Therapeutic Exercise: 8-22 mins        Swain Acree M,PT Acute Rehab Services [phone] (pager)   Alvira Philips 02/11/2021, 12:55 PM

## 2021-02-12 DIAGNOSIS — Z96652 Presence of left artificial knee joint: Secondary | ICD-10-CM | POA: Diagnosis not present

## 2021-02-12 DIAGNOSIS — Z96651 Presence of right artificial knee joint: Secondary | ICD-10-CM | POA: Diagnosis not present

## 2021-02-12 DIAGNOSIS — M1712 Unilateral primary osteoarthritis, left knee: Secondary | ICD-10-CM | POA: Diagnosis not present

## 2021-02-12 DIAGNOSIS — G8929 Other chronic pain: Secondary | ICD-10-CM | POA: Diagnosis not present

## 2021-02-12 DIAGNOSIS — Z79899 Other long term (current) drug therapy: Secondary | ICD-10-CM | POA: Diagnosis not present

## 2021-02-12 DIAGNOSIS — I10 Essential (primary) hypertension: Secondary | ICD-10-CM | POA: Diagnosis not present

## 2021-02-12 DIAGNOSIS — E039 Hypothyroidism, unspecified: Secondary | ICD-10-CM | POA: Diagnosis not present

## 2021-02-12 DIAGNOSIS — E119 Type 2 diabetes mellitus without complications: Secondary | ICD-10-CM | POA: Diagnosis not present

## 2021-02-12 DIAGNOSIS — Z23 Encounter for immunization: Secondary | ICD-10-CM | POA: Diagnosis not present

## 2021-02-12 DIAGNOSIS — Z87891 Personal history of nicotine dependence: Secondary | ICD-10-CM | POA: Diagnosis not present

## 2021-02-12 LAB — CBC
HCT: 34.4 % — ABNORMAL LOW (ref 39.0–52.0)
Hemoglobin: 11.9 g/dL — ABNORMAL LOW (ref 13.0–17.0)
MCH: 32.5 pg (ref 26.0–34.0)
MCHC: 34.6 g/dL (ref 30.0–36.0)
MCV: 94 fL (ref 80.0–100.0)
Platelets: 189 10*3/uL (ref 150–400)
RBC: 3.66 MIL/uL — ABNORMAL LOW (ref 4.22–5.81)
RDW: 12.8 % (ref 11.5–15.5)
WBC: 10.9 10*3/uL — ABNORMAL HIGH (ref 4.0–10.5)
nRBC: 0 % (ref 0.0–0.2)

## 2021-02-12 MED ORDER — ACETAMINOPHEN 325 MG PO TABS: 325.0000 mg | ORAL_TABLET | Freq: Four times a day (QID) | ORAL | 0 refills | Status: DC | PRN

## 2021-02-12 MED ORDER — OXYCODONE HCL 5 MG PO TABS: 5.0000 mg | ORAL_TABLET | ORAL | 0 refills | Status: DC | PRN

## 2021-02-12 MED ORDER — DOCUSATE SODIUM 100 MG PO CAPS: 100.0000 mg | ORAL_CAPSULE | Freq: Two times a day (BID) | ORAL | 0 refills | Status: DC

## 2021-02-12 NOTE — Progress Notes (Signed)
  Subjective: Pt stable - making progress with PT   Objective: Vital signs in last 24 hours: Temp:  [98.2 F (36.8 C)-99.6 F (37.6 C)] 99.2 F (37.3 C) (10/21 2011) Pulse Rate:  [78-99] 91 (10/21 2011) Resp:  [15-17] 17 (10/21 2011) BP: (104-136)/(50-93) 118/74 (10/21 2011) SpO2:  [90 %-95 %] 94 % (10/21 2011)  Intake/Output from previous day: 10/21 0701 - 10/22 0700 In: 1073.6 [P.O.:840; I.V.:233.6] Out: 1 [Urine:1] Intake/Output this shift: No intake/output data recorded.  Exam:  Intact pulses distally Dorsiflexion/Plantar flexion intact  Labs: Recent Labs    02/11/21 0243 02/12/21 0236  HGB 13.3 11.9*   Recent Labs    02/11/21 0243 02/12/21 0236  WBC 13.8* 10.9*  RBC 4.06* 3.66*  HCT 38.7* 34.4*  PLT 213 189   Recent Labs    02/11/21 0243  NA 136  K 3.7  CL 99  CO2 31  BUN 9  CREATININE 0.90  GLUCOSE 147*  CALCIUM 8.3*   No results for input(s): LABPT, INR in the last 72 hours.  Assessment/Plan: Dc home today after PT this am Does have some family support   Anderson Malta 02/12/2021, 7:37 AM

## 2021-02-12 NOTE — TOC Transition Note (Signed)
Transition of Care [facility]) - CM/SW Discharge Note   Patient Details  Name: [name] MRN: [id number] Date of Birth: 12/23/1965  Transition of Care [facility]) CM/SW Contact:  Maebelle Munroe, RN Phone Number: 02/12/2021, 1:49 PM   Clinical Narrative:  [facility] team for discharge planning. Spoke to patient at bedside. He voice pain and grimaces. He shares that he has no one to take him home today. Call to daughter- [name]. She voices her uncle- [name] will pick him up. Pt will recover at [facility] in [place] where he will have assistance and family close by. Pt is in agreement with plan. Call to [facility]- [phone] to confirm plan for pick up and recovery. [name] voices agreement with plan. Call to [facility] health to notify that patient will be going to [id number]. Rep shares they can no longer staff the [facility] location for 10-14 days. Call to [facility]. Home health PT arranged. Notified pt., daughter and brother of the home health agency change. Will continue to monitor for discharge needs.     Final next level of care: [place] Barriers to Discharge: No Barriers Identified   Patient Goals and CMS Choice Patient states their goals for this hospitalization and ongoing recovery are:: Return home and be more mobile.   Choice offered to / list presented to : Patient  Discharge Placement                       Discharge Plan and Services                          HH Arranged: PT [facility] Agency: [place] Date [place]: 02/12/21 Time [place]: 2820 Representative spoke with at [place]: [name]  Social Determinants of Health ([place]) Interventions     Readmission Risk Interventions No flowsheet data found.

## 2021-02-12 NOTE — Progress Notes (Signed)
Physical Therapy Treatment Patient Details Name: [name] MRN: [id number] DOB: November 26, 1965 Today's Date: 02/12/2021   History of Present Illness Patient is a 55 year old male admitted 10/20 s/p left total knee arthroplasty. PMH:  anxiety, depression, GERD, HTN, right TKA, DM, CAD, OSA, Benzo withdrawal    PT Comments    Pt supine in bed on arrival.  He remains most limited by pain but continues to progress well functionally with PT.  Pt performed HEP this session.  Plan for d/c home later today.  RN aware.    Recommendations for follow up therapy are one component of a multi-disciplinary discharge planning process, led by the attending physician.  Recommendations may be updated based on patient status, additional functional criteria and insurance authorization.  Follow Up Recommendations  Follow surgeon's recommendation for DC plan and follow-up therapies     Equipment Recommendations  None recommended by PT (has all DME at home)    Recommendations for Other Services       Precautions / Restrictions Precautions Precautions: Fall;Knee Precaution Booklet Issued: Yes (comment) Restrictions Weight Bearing Restrictions: Yes LLE Weight Bearing: Weight bearing as tolerated     Mobility  Bed Mobility Overal bed mobility: Needs Assistance Bed Mobility: Supine to Sit;Sit to Supine     Supine to sit: Supervision Sit to supine: Supervision   General bed mobility comments: Used gt belt to lift LLE back to bed against gravity. Increased time and effort due to pain.    Transfers Overall transfer level: Needs assistance Equipment used: Rolling walker (2 wheeled) Transfers: Sit to/from Stand Sit to Stand: Supervision;From elevated surface         General transfer comment: Cues for hand placement.  Adjusted RW height for improved fit and educated on how to size his RW at home.  Ambulation/Gait Ambulation/Gait assistance: Supervision Gait Distance (Feet): 120  Feet Assistive device: Rolling walker (2 wheeled) Gait Pattern/deviations: Step-to pattern;Decreased stride length;Decreased weight shift to left;Decreased step length - right;Decreased stance time - left;Antalgic     General Gait Details: Cues for UE use to offload LLE due to pain.   Stairs             Wheelchair Mobility    Modified Rankin (Stroke Patients Only)       Balance Overall balance assessment: Needs assistance Sitting-balance support: No upper extremity supported;Feet supported Sitting balance-Leahy Scale: Fair       Standing balance-Leahy Scale: Fair                              Cognition Arousal/Alertness: Awake/alert Behavior During Therapy: WFL for tasks assessed/performed Overall Cognitive Status: Within Functional Limits for tasks assessed                                        Exercises Total Joint Exercises Ankle Circles/Pumps: AROM;Both;10 reps;Supine Quad Sets: AROM;10 reps;Supine;Left Heel Slides: AAROM;Left;10 reps;Supine Hip ABduction/ADduction: AAROM;Left;10 reps;Supine Straight Leg Raises: AAROM;Left;10 reps;Supine Goniometric ROM: 0-60 L knee.    General Comments        Pertinent Vitals/Pain Pain Assessment: 0-10 Pain Score: 8  Pain Location: left knee Pain Descriptors / Indicators: Aching;Grimacing;Guarding Pain Intervention(s): Monitored during session;Repositioned    Home Living                      Prior Function  PT Goals (current goals can now be found in the care plan section) Acute Rehab PT Goals Patient Stated Goal: to go home Potential to Achieve Goals: Good Progress towards PT goals: Progressing toward goals    Frequency    7X/week      PT Plan Current plan remains appropriate    Co-evaluation              AM-PAC PT "6 Clicks" Mobility   Outcome Measure  Help needed turning from your back to your side while in a flat bed without using  bedrails?: A Little Help needed moving from lying on your back to sitting on the side of a flat bed without using bedrails?: A Little Help needed moving to and from a bed to a chair (including a wheelchair)?: A Little Help needed standing up from a chair using your arms (e.g., wheelchair or bedside chair)?: A Little Help needed to walk in hospital room?: A Little Help needed climbing 3-5 steps with a railing? : A Little 6 Click Score: 18    End of Session Equipment Utilized During Treatment: Gait belt Activity Tolerance: Patient tolerated treatment well Patient left: in bed;with call bell/phone within reach Nurse Communication: Mobility status;Other (comment);Patient requests pain meds (ready for d/c from a PT stand point.) PT Visit Diagnosis: Unsteadiness on feet (R26.81);Muscle weakness (generalized) (M62.81);Pain Pain - Right/Left: Left Pain - part of body: Knee     Time: 1151-1220 PT Time Calculation (min) (ACUTE ONLY): 29 min  Charges:  $Gait Training: 8-22 mins $Therapeutic Exercise: 8-22 mins                     [name] , PTA Acute Rehabilitation Services Pager [phone] Office [phone]    [name] Eli Hose 02/12/2021, 12:25 PM

## 2021-02-12 NOTE — Progress Notes (Signed)
Physical Therapy Treatment Patient Details Name: [name] MRN: [id number] DOB: 1965-11-14 Today's Date: 02/12/2021   History of Present Illness Patient is a 55 year old male admitted 10/20 s/p left total knee arthroplasty. PMH:  anxiety, depression, GERD, HTN, right TKA, DM, CAD, OSA, Benzo withdrawal    PT Comments    The pt was able to demo good progress with bed mobility that mimics home environment, hallway ambulation with use of RW and single crutch, and stair navigation. The pt demos improved speed and reports improved comfort with use of RW with gait, but expressed he is not sure that the RW would fit in his camper and therefore a single crutch was tried to provide support with less bulky DME. The pt was given HEP for exercises and post-op education as well as educated on car transfers, reduced fall risk in the home, and progressive walking program. Pt was agreeable to all education and reports he feels less anxious about mobility at home following session. Continue to plan for d/c home later today.    Recommendations for follow up therapy are one component of a multi-disciplinary discharge planning process, led by the attending physician.  Recommendations may be updated based on patient status, additional functional criteria and insurance authorization.  Follow Up Recommendations  Follow surgeon's recommendation for DC plan and follow-up therapies     Equipment Recommendations  None recommended by PT (pt has needed DME (RW and crutches))    Recommendations for Other Services       Precautions / Restrictions Precautions Precautions: Fall;Knee Precaution Booklet Issued: Yes (comment) Precaution Comments: handout given and reviewed Required Braces or Orthoses: Knee Immobilizer - Left (MD told pt he could remove the immobilizer) Restrictions Weight Bearing Restrictions: Yes LLE Weight Bearing: Weight bearing as tolerated     Mobility  Bed Mobility Overal bed mobility:  Needs Assistance Bed Mobility: Sit to Supine     Supine to sit: Supervision     General bed mobility comments: pt came to long sitting to scoot to end og bed to mimic home environment with cues for technique initially but pt able to complete without issue. supervision for safety during session but likely modI (increased time)    Transfers Overall transfer level: Needs assistance Equipment used: Rolling walker (2 wheeled);Crutches Transfers: Sit to/from Stand Sit to Stand: Min guard         General transfer comment: cues for technique, but no assist given. pt unsure RW fits through camper, trialed use of single crutch for improved mobility. pt able to complete sit-stand without assist  Ambulation/Gait Ambulation/Gait assistance: Supervision Gait Distance (Feet): 150 Feet Assistive device: Rolling walker (2 wheeled);Crutches Gait Pattern/deviations: Step-to pattern;Decreased stride length;Decreased weight shift to left;Decreased step length - right;Decreased stance time - left;Antalgic Gait velocity: 0.20 m/s Gait velocity interpretation: <1.31 ft/sec, indicative of household ambulator General Gait Details: antalgic with pt unable to advance RLE past LLE. shorter strides and slower gait with use of single crutch, but no instability   Stairs Stairs: Yes Stairs assistance: Supervision Stair Management: One rail Left;Step to pattern;Sideways Number of Stairs: 2 General stair comments: pt educated in side stepping with RLE leading ascending and LLE leading descending. no LOB      Balance Overall balance assessment: Needs assistance Sitting-balance support: No upper extremity supported;Feet supported Sitting balance-Leahy Scale: Fair     Standing balance support: Single extremity supported Standing balance-Leahy Scale: Fair Standing balance comment: static stance with at least single UE support due to pain in  LLE.                            Cognition  Arousal/Alertness: Awake/alert Behavior During Therapy: WFL for tasks assessed/performed Overall Cognitive Status: Within Functional Limits for tasks assessed                                           General Comments General comments (skin integrity, edema, etc.): VSS on RA. pt expressed concern that RW would not fit in camper therefore trialed use of RW, and single crutch to improve support and mobility without RW      Pertinent Vitals/Pain Pain Assessment: Faces Faces Pain Scale: Hurts little more Pain Location: left knee Pain Descriptors / Indicators: Aching;Grimacing;Guarding Pain Intervention(s): Limited activity within patient's tolerance;Monitored during session;Repositioned;Ice applied     PT Goals (current goals can now be found in the care plan section) Acute Rehab PT Goals Patient Stated Goal: to go home PT Goal Formulation: With patient Time For Goal Achievement: 02/18/21 Potential to Achieve Goals: Good Progress towards PT goals: Progressing toward goals    Frequency    7X/week      PT Plan Current plan remains appropriate       AM-PAC PT "6 Clicks" Mobility   Outcome Measure  Help needed turning from your back to your side while in a flat bed without using bedrails?: None Help needed moving from lying on your back to sitting on the side of a flat bed without using bedrails?: None Help needed moving to and from a bed to a chair (including a wheelchair)?: A Little Help needed standing up from a chair using your arms (e.g., wheelchair or bedside chair)?: A Little Help needed to walk in hospital room?: A Little Help needed climbing 3-5 steps with a railing? : A Little 6 Click Score: 20    End of Session Equipment Utilized During Treatment: Gait belt Activity Tolerance: Patient tolerated treatment well Patient left: with call bell/phone within reach;in bed (Ice machine refilled) Nurse Communication: Mobility status;Weight bearing  status PT Visit Diagnosis: Unsteadiness on feet (R26.81);Muscle weakness (generalized) (M62.81);Pain Pain - Right/Left: Left Pain - part of body: Knee     Time: 0812-0853 PT Time Calculation (min) (ACUTE ONLY): 41 min  Charges:  $Gait Training: 23-37 mins $Therapeutic Activity: 8-22 mins                     [place] Car[place], PT, DPT   Acute Rehabilitation Department Pager #: [phone]   Sandra Cockayne 02/12/2021, 9:06 AM

## 2021-02-13 DIAGNOSIS — E1169 Type 2 diabetes mellitus with other specified complication: Secondary | ICD-10-CM | POA: Diagnosis not present

## 2021-02-13 DIAGNOSIS — E1140 Type 2 diabetes mellitus with diabetic neuropathy, unspecified: Secondary | ICD-10-CM | POA: Diagnosis not present

## 2021-02-13 DIAGNOSIS — Z471 Aftercare following joint replacement surgery: Secondary | ICD-10-CM | POA: Diagnosis not present

## 2021-02-13 DIAGNOSIS — E1121 Type 2 diabetes mellitus with diabetic nephropathy: Secondary | ICD-10-CM | POA: Diagnosis not present

## 2021-02-13 DIAGNOSIS — E785 Hyperlipidemia, unspecified: Secondary | ICD-10-CM | POA: Diagnosis not present

## 2021-02-13 DIAGNOSIS — Z96652 Presence of left artificial knee joint: Secondary | ICD-10-CM | POA: Diagnosis not present

## 2021-02-13 DIAGNOSIS — E291 Testicular hypofunction: Secondary | ICD-10-CM | POA: Diagnosis not present

## 2021-02-13 DIAGNOSIS — E034 Atrophy of thyroid (acquired): Secondary | ICD-10-CM | POA: Diagnosis not present

## 2021-02-13 DIAGNOSIS — I10 Essential (primary) hypertension: Secondary | ICD-10-CM | POA: Diagnosis not present

## 2021-02-15 NOTE — Op Note (Signed)
NAME: [name], LINKER MEDICAL RECORD NO: [id number] ACCOUNT NO: [id number] DATE OF BIRTH: 03/07/1966 FACILITY: [facility] LOCATION: [facility] PHYSICIAN: Yetta Barre. Marlou Sa, MD  Operative Report   DATE OF PROCEDURE: 02/10/2021  PREOPERATIVE DIAGNOSIS:  Left knee arthritis.  POSTOPERATIVE DIAGNOSIS:  Left knee arthritis.  PROCEDURE:  Left total knee replacement using [employer]ker press-fit Triathlon components, posterior cruciate retaining 5 femur, 5 tibia, 11 mm deep dish polyethylene spacer, 35 mm press-fit 3-peg patella.  SURGEON ATTENDING:  Meredith Pel, MD  ASSISTANT:  Annie Main.  INDICATIONS:  The patient is a 55 year old patient with end-stage knee arthritis, who presents for operative management after explanation of risks and benefits and failure of conservative treatment.  DESCRIPTION OF PROCEDURE:  The patient was brought to the operating room where spinal anesthetic was induced.  A timeout was called.  Left leg was pre-scrubbed with alcohol and Betadine, allowed to air dry, prepped with DuraPrep solution and draped in a  sterile manner.  Ioban used to cover the operative field.  The leg was elevated, exsanguinated with the Esmarch wrap.  Tourniquet was inflated.  A median parapatellar approach was made.  Skin and subcutaneous tissue was sharply divided.  The patient had  severe medial and patellofemoral compartment arthritis.  Fat pad partially excised.  Lateral patellofemoral ligament released.  Soft tissue removed from the anterior distal femur, mild to moderate soft tissue dissection performed in this varus knee.   Anterior horn of the lateral meniscus and ACL were released.  Intramedullary alignment then used to make a cut perpendicular to the mechanical axis off of the least affected lateral tibial plateau.  This was initially at 9 mm and later revised to 11 mm  in order to facilitate full extension after the distal femoral cut.  Distal femoral cut was then made using intramedullary  alignment 5 degrees of valgus.  This was made at 8 mm.  Spacer block placed and the patient did achieve full extension.  Good  collateral ligament stability.  Femur was sized to a size 5.  Anterior, posterior and chamfer cuts were made.  Final preparation performed on the tibia in the correct rotation.  Trial components were placed and with the 11 spacer in position, the patient  had about 3 degrees of hyperextension, full flexion.  Patella was then cut down from 25 to 14 and a 3-peg patellar trial was placed, which gave excellent tracking.  Component position was marked.  Final tibial preparation was made.  Trial components  were removed.  Thorough irrigation was performed.  Tranexamic acid sponge allowed to sit within the knee and the capsule was anesthetized using a combination of Marcaine, saline and Exparel.  Next, the true components were placed with excellent press fit  obtained and high quality bone.  Same stability parameters were maintained.  Tourniquet was released. Bleeding points encountered were controlled using electrocautery.  Thorough irrigation was performed.  Vancomycin powder was placed into the  intramedullary canal and the IrriSept solution was used after the incisions as well as after the arthrotomy at multiple times during the case.  The arthrotomy was closed over bolster using #1 Vicryl suture followed by IrriSept solution and vancomycin  powder within the joint followed by interrupted inverted 0 Vicryl suture, 2-0 Vicryl suture, and 3-0 Monocryl.  It should also be noted that the joint was injected with Marcaine, morphine, clonidine for postoperative pain relief.  Aquacel dressing, Ace  wrap, knee immobilizer and IceMan placed.  The patient tolerated the procedure well  without immediate complications and transferred to the recovery room in stable condition.  [name]'s assistance was required at all times for retraction, open and closing,  mobilization of tissue.  His assistance was a  medical necessity.   SHW D: 02/11/2021 11:42:15 am T: 02/11/2021 11:57:00 pm  JOB: [id number]/ [id number]

## 2021-02-16 ENCOUNTER — Telehealth: Payer: Self-pay | Admitting: Orthopedic Surgery

## 2021-02-16 DIAGNOSIS — E034 Atrophy of thyroid (acquired): Secondary | ICD-10-CM | POA: Diagnosis not present

## 2021-02-16 DIAGNOSIS — E1121 Type 2 diabetes mellitus with diabetic nephropathy: Secondary | ICD-10-CM | POA: Diagnosis not present

## 2021-02-16 DIAGNOSIS — E1169 Type 2 diabetes mellitus with other specified complication: Secondary | ICD-10-CM | POA: Diagnosis not present

## 2021-02-16 DIAGNOSIS — E785 Hyperlipidemia, unspecified: Secondary | ICD-10-CM | POA: Diagnosis not present

## 2021-02-16 DIAGNOSIS — I10 Essential (primary) hypertension: Secondary | ICD-10-CM | POA: Diagnosis not present

## 2021-02-16 DIAGNOSIS — Z471 Aftercare following joint replacement surgery: Secondary | ICD-10-CM | POA: Diagnosis not present

## 2021-02-16 DIAGNOSIS — E1140 Type 2 diabetes mellitus with diabetic neuropathy, unspecified: Secondary | ICD-10-CM | POA: Diagnosis not present

## 2021-02-16 DIAGNOSIS — E291 Testicular hypofunction: Secondary | ICD-10-CM | POA: Diagnosis not present

## 2021-02-16 DIAGNOSIS — Z96652 Presence of left artificial knee joint: Secondary | ICD-10-CM | POA: Diagnosis not present

## 2021-02-16 NOTE — Telephone Encounter (Signed)
Tiffany with [facility] called stating she saw the pt today and his BP was elevated. She states it was 170/80 and she just wanted to make Dr.Dean aware.   Tiffany CB# [phone]

## 2021-02-16 NOTE — Telephone Encounter (Signed)
Can you have him see his primary care provider for this problem.  Thanks

## 2021-02-16 NOTE — Telephone Encounter (Signed)
Just as a FYI. Patient is s/p L TKA preformed on 02/10/2021.

## 2021-02-17 NOTE — Telephone Encounter (Signed)
Attempted to contact patient however had to leave a voicemail. Informed patient's of Dr. Forbes Cellar request for him to see his primary care doctor for his elevated blood pressure.

## 2021-02-18 ENCOUNTER — Telehealth: Payer: Self-pay

## 2021-02-18 DIAGNOSIS — E1121 Type 2 diabetes mellitus with diabetic nephropathy: Secondary | ICD-10-CM | POA: Diagnosis not present

## 2021-02-18 DIAGNOSIS — I10 Essential (primary) hypertension: Secondary | ICD-10-CM | POA: Diagnosis not present

## 2021-02-18 DIAGNOSIS — E1140 Type 2 diabetes mellitus with diabetic neuropathy, unspecified: Secondary | ICD-10-CM | POA: Diagnosis not present

## 2021-02-18 DIAGNOSIS — E785 Hyperlipidemia, unspecified: Secondary | ICD-10-CM | POA: Diagnosis not present

## 2021-02-18 DIAGNOSIS — E291 Testicular hypofunction: Secondary | ICD-10-CM | POA: Diagnosis not present

## 2021-02-18 DIAGNOSIS — Z471 Aftercare following joint replacement surgery: Secondary | ICD-10-CM | POA: Diagnosis not present

## 2021-02-18 DIAGNOSIS — E034 Atrophy of thyroid (acquired): Secondary | ICD-10-CM | POA: Diagnosis not present

## 2021-02-18 DIAGNOSIS — E1169 Type 2 diabetes mellitus with other specified complication: Secondary | ICD-10-CM | POA: Diagnosis not present

## 2021-02-18 DIAGNOSIS — Z96652 Presence of left artificial knee joint: Secondary | ICD-10-CM | POA: Diagnosis not present

## 2021-02-18 NOTE — Telephone Encounter (Signed)
Transition Care Management Follow-up Telephone Call Date of discharge and from where: 02/12/2021  [facility] How have you been since you were released from the hospital? " Doing well" Any questions or concerns? No  Items Reviewed: Did the pt receive and understand the discharge instructions provided? Yes  Medications obtained and verified? Yes  Other? No  Any new allergies since your discharge? No  Dietary orders reviewed? Yes Do you have support at home? Yes   Home Care and Equipment/Supplies: Were home health services ordered? yes If so, what is the name of the agency? [facility]  Has the agency set up a time to come to the patient's home? yes Were any new equipment or medical supplies ordered?  No What is the name of the medical supply agency?  Were you able to get the supplies/equipment? not applicable Do you have any questions related to the use of the equipment or supplies? No  Functional Questionnaire: (I = Independent and D = Dependent) ADLs: I  Bathing/Dressing- I  Meal Prep- I  Eating- I  Maintaining continence- I  Transferring/Ambulation- I  Managing Meds-   Follow up appointments reviewed:  PCP Hospital f/u appt confirmed?  no Specialist Hospital f/u appt confirmed? Yes  Scheduled to see Dr. Marlou Sa on 02/24/2021  Are transportation arrangements needed? No  If their condition worsens, is the pt aware to call PCP or go to the Emergency Dept.? Yes Was the patient provided with contact information for the PCP's office or ED? Yes Was to pt encouraged to call back with questions or concerns? Yes Tomasa Rand, RN, BSN, CEN [facility] [employer] [phone]

## 2021-02-19 DIAGNOSIS — M1712 Unilateral primary osteoarthritis, left knee: Secondary | ICD-10-CM

## 2021-02-20 ENCOUNTER — Other Ambulatory Visit: Payer: Self-pay | Admitting: Internal Medicine

## 2021-02-20 DIAGNOSIS — I10 Essential (primary) hypertension: Secondary | ICD-10-CM | POA: Diagnosis not present

## 2021-02-20 DIAGNOSIS — E785 Hyperlipidemia, unspecified: Secondary | ICD-10-CM | POA: Diagnosis not present

## 2021-02-20 DIAGNOSIS — E1121 Type 2 diabetes mellitus with diabetic nephropathy: Secondary | ICD-10-CM | POA: Diagnosis not present

## 2021-02-20 DIAGNOSIS — E1140 Type 2 diabetes mellitus with diabetic neuropathy, unspecified: Secondary | ICD-10-CM | POA: Diagnosis not present

## 2021-02-20 DIAGNOSIS — Z96652 Presence of left artificial knee joint: Secondary | ICD-10-CM | POA: Diagnosis not present

## 2021-02-20 DIAGNOSIS — Z471 Aftercare following joint replacement surgery: Secondary | ICD-10-CM | POA: Diagnosis not present

## 2021-02-20 DIAGNOSIS — E291 Testicular hypofunction: Secondary | ICD-10-CM | POA: Diagnosis not present

## 2021-02-20 DIAGNOSIS — E034 Atrophy of thyroid (acquired): Secondary | ICD-10-CM | POA: Diagnosis not present

## 2021-02-20 DIAGNOSIS — E1169 Type 2 diabetes mellitus with other specified complication: Secondary | ICD-10-CM | POA: Diagnosis not present

## 2021-02-21 ENCOUNTER — Telehealth: Payer: Self-pay

## 2021-02-21 DIAGNOSIS — E1121 Type 2 diabetes mellitus with diabetic nephropathy: Secondary | ICD-10-CM | POA: Diagnosis not present

## 2021-02-21 DIAGNOSIS — E1140 Type 2 diabetes mellitus with diabetic neuropathy, unspecified: Secondary | ICD-10-CM | POA: Diagnosis not present

## 2021-02-21 DIAGNOSIS — E785 Hyperlipidemia, unspecified: Secondary | ICD-10-CM | POA: Diagnosis not present

## 2021-02-21 DIAGNOSIS — E1169 Type 2 diabetes mellitus with other specified complication: Secondary | ICD-10-CM | POA: Diagnosis not present

## 2021-02-21 DIAGNOSIS — E034 Atrophy of thyroid (acquired): Secondary | ICD-10-CM | POA: Diagnosis not present

## 2021-02-21 DIAGNOSIS — E291 Testicular hypofunction: Secondary | ICD-10-CM | POA: Diagnosis not present

## 2021-02-21 DIAGNOSIS — I10 Essential (primary) hypertension: Secondary | ICD-10-CM | POA: Diagnosis not present

## 2021-02-21 DIAGNOSIS — Z96652 Presence of left artificial knee joint: Secondary | ICD-10-CM | POA: Diagnosis not present

## 2021-02-21 DIAGNOSIS — Z471 Aftercare following joint replacement surgery: Secondary | ICD-10-CM | POA: Diagnosis not present

## 2021-02-21 NOTE — Telephone Encounter (Signed)
FYI

## 2021-02-21 NOTE — Telephone Encounter (Signed)
Tiffany with [facility] wanted to let Dr. Marlou Sa know that patient's BP was elevated, but no signs or symptoms. BP was 155/90.  Cb# [phone].  Please advise.  Thank you.

## 2021-02-21 NOTE — Telephone Encounter (Signed)
thx

## 2021-02-23 DIAGNOSIS — Z96652 Presence of left artificial knee joint: Secondary | ICD-10-CM | POA: Diagnosis not present

## 2021-02-23 DIAGNOSIS — E785 Hyperlipidemia, unspecified: Secondary | ICD-10-CM | POA: Diagnosis not present

## 2021-02-23 DIAGNOSIS — E1121 Type 2 diabetes mellitus with diabetic nephropathy: Secondary | ICD-10-CM | POA: Diagnosis not present

## 2021-02-23 DIAGNOSIS — I10 Essential (primary) hypertension: Secondary | ICD-10-CM | POA: Diagnosis not present

## 2021-02-23 DIAGNOSIS — Z471 Aftercare following joint replacement surgery: Secondary | ICD-10-CM | POA: Diagnosis not present

## 2021-02-23 DIAGNOSIS — E034 Atrophy of thyroid (acquired): Secondary | ICD-10-CM | POA: Diagnosis not present

## 2021-02-23 DIAGNOSIS — E291 Testicular hypofunction: Secondary | ICD-10-CM | POA: Diagnosis not present

## 2021-02-23 DIAGNOSIS — E1169 Type 2 diabetes mellitus with other specified complication: Secondary | ICD-10-CM | POA: Diagnosis not present

## 2021-02-23 DIAGNOSIS — E1140 Type 2 diabetes mellitus with diabetic neuropathy, unspecified: Secondary | ICD-10-CM | POA: Diagnosis not present

## 2021-02-23 NOTE — Discharge Summary (Signed)
Physician Discharge Summary      Patient ID: [name] MRN: [id number] DOB/AGE: [birth year] 56 y.o.  Admit date: 02/10/2021 Discharge date: 02/12/2021  Admission Diagnoses:  Active Problems:   Osteoarthritis   S/P TKR (total knee replacement), left   Arthritis of left knee   Discharge Diagnoses:  Same  Surgeries: Procedure(s): LEFT TOTAL KNEE ARTHROPLASTY on 02/10/2021   Consultants:   Discharged Condition: Stable  Hospital Course: [name] is an 55 y.o. male who was admitted 02/10/2021 with a chief complaint of left knee pain, and found to have a diagnosis of left knee osteoarthritis.  They were brought to the operating room on 02/10/2021 and underwent the above named procedures.  Pt awoke from anesthesia without complication and was transferred to the floor. On POD1, patients pain was moderate but controlled. He was able to mobilize well with PT. Denied CP or SOB.  Wanted to ensure he was confident with mobility so he was discharged on POD2 after 2 sessions of PT.  Pt will f/u with Dr. Marlou Sa in clinic in ~2 weeks.   Antibiotics given:  Anti-infectives (From admission, onward)    Start     Dose/Rate Route Frequency Ordered Stop   02/10/21 2200  ceFAZolin (ANCEF) IVPB 2g/100 mL premix        2 g 200 mL/hr over 30 Minutes Intravenous Every 8 hours 02/10/21 1949 02/11/21 1114   02/10/21 1733  vancomycin (VANCOCIN) powder  Status:  Discontinued          As needed 02/10/21 1733 02/10/21 1828   02/10/21 1030  ceFAZolin (ANCEF) IVPB 2g/100 mL premix        2 g 200 mL/hr over 30 Minutes Intravenous On call to O.R. 02/10/21 1024 02/10/21 1531     .  Recent vital signs:  Vitals:   02/11/21 2011 02/12/21 0920  BP: 118/74 106/63  Pulse: 91 89  Resp: 17 16  Temp: 99.2 F (37.3 C)   SpO2: 94% 100%    Recent laboratory studies:  Results for orders placed or performed during the hospital encounter of 02/10/21  Glucose, capillary  Result Value Ref Range    Glucose-Capillary 127 (H) 70 - 99 mg/dL  Glucose, capillary  Result Value Ref Range   Glucose-Capillary 98 70 - 99 mg/dL  CBC  Result Value Ref Range   WBC 13.8 (H) 4.0 - 10.5 K/uL   RBC 4.06 (L) 4.22 - 5.81 MIL/uL   Hemoglobin 13.3 13.0 - 17.0 g/dL   HCT 38.7 (L) 39.0 - 52.0 %   MCV 95.3 80.0 - 100.0 fL   MCH 32.8 26.0 - 34.0 pg   MCHC 34.4 30.0 - 36.0 g/dL   RDW 12.9 11.5 - 15.5 %   Platelets 213 150 - 400 K/uL   nRBC 0.0 0.0 - 0.2 %  Basic metabolic panel  Result Value Ref Range   Sodium 136 135 - 145 mmol/L   Potassium 3.7 3.5 - 5.1 mmol/L   Chloride 99 98 - 111 mmol/L   CO2 31 22 - 32 mmol/L   Glucose, Bld 147 (H) 70 - 99 mg/dL   BUN 9 6 - 20 mg/dL   Creatinine, Ser 0.90 0.61 - 1.24 mg/dL   Calcium 8.3 (L) 8.9 - 10.3 mg/dL   GFR, Estimated >60 >60 mL/min   Anion gap 6 5 - 15  CBC  Result Value Ref Range   WBC 10.9 (H) 4.0 - 10.5 K/uL   RBC 3.66 (L) 4.22 -  5.81 MIL/uL   Hemoglobin 11.9 (L) 13.0 - 17.0 g/dL   HCT 34.4 (L) 39.0 - 52.0 %   MCV 94.0 80.0 - 100.0 fL   MCH 32.5 26.0 - 34.0 pg   MCHC 34.6 30.0 - 36.0 g/dL   RDW 12.8 11.5 - 15.5 %   Platelets 189 150 - 400 K/uL   nRBC 0.0 0.0 - 0.2 %    Discharge Medications:   Allergies as of 02/12/2021       Reactions   Ambien [zolpidem] Swelling, Other (See Comments)   Swelling in the throat        Medication List     STOP taking these medications    ARIPiprazole 5 MG tablet Commonly known as: ABILIFY   busPIRone 30 MG tablet Commonly known as: BUSPAR   clonazePAM 1 MG tablet Commonly known as: KLONOPIN   HYDROcodone-acetaminophen 10-325 MG tablet Commonly known as: NORCO   traZODone 100 MG tablet Commonly known as: DESYREL       TAKE these medications    acetaminophen 325 MG tablet Commonly known as: TYLENOL Take 1-2 tablets (325-650 mg total) by mouth every 6 (six) hours as needed for mild pain (pain score 1-3 or temp > 100.5).   aspirin EC 81 MG tablet Take 1 tablet (81 mg total) by  mouth daily. Swallow whole.   atorvastatin 80 MG tablet Commonly known as: LIPITOR Take 80 mg by mouth daily.   blood glucose meter kit and supplies Dispense based on patient and insurance preference. Use to check blood sugars once daily.   buPROPion 300 MG 24 hr tablet Commonly known as: Wellbutrin XL Take 1 tablet (300 mg total) by mouth daily.   celecoxib 200 MG capsule Commonly known as: CELEBREX Take 1 capsule (200 mg total) by mouth daily.   clobetasol 0.05 % external solution Commonly known as: TEMOVATE Apply 1 application topically 2 (two) times daily as needed (scalp irritation). What changed: Another medication with the same name was changed. Make sure you understand how and when to take each.   Clobetasol Propionate 0.05 % shampoo Apply to dry scalp and leave on for 15 minutes What changed:  how much to take how to take this when to take this additional instructions   dicyclomine 20 MG tablet Commonly known as: BENTYL TAKE 1 TABLET BY MOUTH EVERY 6 HOURS   docusate sodium 100 MG capsule Commonly known as: COLACE Take 1 capsule (100 mg total) by mouth 2 (two) times daily.   ezetimibe 10 MG tablet Commonly known as: Zetia Take 1 tablet (10 mg total) by mouth daily.   furosemide 20 MG tablet Commonly known as: LASIX Take 1 tablet (20 mg total) by mouth every other day.   gabapentin 600 MG tablet Commonly known as: NEURONTIN TAKE 1 TABLET BY MOUTH THREE TIMES DAILY   glucose blood test strip Use once daily to check BS at various times  E11.9   levothyroxine 88 MCG tablet Commonly known as: SYNTHROID TAKE 1 TABLET BY MOUTH ONCE DAILY BEFORE BREAKFAST   losartan 100 MG tablet Commonly known as: COZAAR Take 1 tablet (100 mg total) by mouth daily.   metFORMIN 500 MG tablet Commonly known as: GLUCOPHAGE Take 1 tablet (500 mg total) by mouth 2 (two) times daily with a meal.   methocarbamol 500 MG tablet Commonly known as: ROBAXIN Take 1 tablet (500  mg total) by mouth every 8 (eight) hours as needed.   metoprolol tartrate 25 MG tablet Commonly known  as: LOPRESSOR Take 1 tablet by mouth twice daily   multivitamin with minerals Tabs tablet Take 1 tablet by mouth daily.   omeprazole 40 MG capsule Commonly known as: PRILOSEC TAKE 1 CAPSULE BY MOUTH IN THE MORNING AND AT BEDTIME   oxyCODONE 5 MG immediate release tablet Commonly known as: Oxy IR/ROXICODONE Take 1-2 tablets (5-10 mg total) by mouth every 4 (four) hours as needed for moderate pain (pain score 4-6).   promethazine 12.5 MG tablet Commonly known as: PHENERGAN Take 1 tablet (12.5 mg total) by mouth every 4 (four) hours as needed for nausea or vomiting. What changed: when to take this        Diagnostic Studies: No results found.  Disposition: Discharge disposition: 01-Home or Self Care       Discharge Instructions     Call MD / Call 911   Complete by: As directed    If you experience chest pain or shortness of breath, CALL 911 and be transported to the hospital emergency room.  If you develope a fever above 101 F, pus (white drainage) or increased drainage or redness at the wound, or calf pain, call your surgeon's office.   Call MD / Call 911   Complete by: As directed    If you experience chest pain or shortness of breath, CALL 911 and be transported to the hospital emergency room.  If you develope a fever above 101 F, pus (white drainage) or increased drainage or redness at the wound, or calf pain, call your surgeon's office.   Constipation Prevention   Complete by: As directed    Drink plenty of fluids.  Prune juice may be helpful.  You may use a stool softener, such as Colace (over the counter) 100 mg twice a day.  Use MiraLax (over the counter) for constipation as needed.   Constipation Prevention   Complete by: As directed    Drink plenty of fluids.  Prune juice may be helpful.  You may use a stool softener, such as Colace (over the counter) 100 mg  twice a day.  Use MiraLax (over the counter) for constipation as needed.   Diet - low sodium heart healthy   Complete by: As directed    Diet - low sodium heart healthy   Complete by: As directed    Discharge instructions   Complete by: As directed    You may shower, dressing is waterproof.  Do not remove the dressing, we will remove it at your first post-op appointment.  Do not take a bath or soak the knee in a tub or pool.  You may weightbear as you can tolerate on the operative leg with a walker.  Continue using the CPM machine 3 times per day for one hour each time, increasing the degrees of range of motion daily.  Use the blue cradle boot under your heel to work on getting your leg straight.  Do NOT put a pillow under your knee.  You will follow-up with Dr. Marlou Sa in the clinic in 2 weeks at your given appointment date.    INSTRUCTIONS AFTER JOINT REPLACEMENT   Remove items at home which could result in a fall. This includes throw rugs or furniture in walking pathways ICE to the affected joint every three hours while awake for 30 minutes at a time, for at least the first 3-5 days, and then as needed for pain and swelling.  Continue to use ice for pain and swelling. You may notice swelling that  will progress down to the foot and ankle.  This is normal after surgery.  Elevate your leg when you are not up walking on it.   Continue to use the breathing machine you got in the hospital (incentive spirometer) which will help keep your temperature down.  It is common for your temperature to cycle up and down following surgery, especially at night when you are not up moving around and exerting yourself.  The breathing machine keeps your lungs expanded and your temperature down.   DIET:  As you were doing prior to hospitalization, we recommend a well-balanced diet.  DRESSING / WOUND CARE / SHOWERING  Keep the surgical dressing until follow up.  The dressing is water proof, so you can shower without any  extra covering.  IF THE DRESSING FALLS OFF or the wound gets wet inside, change the dressing with sterile gauze.  Please use good hand washing techniques before changing the dressing.  Do not use any lotions or creams on the incision until instructed by your surgeon.    ACTIVITY  Increase activity slowly as tolerated, but follow the weight bearing instructions below.   No driving for 6 weeks or until further direction given by your physician.  You cannot drive while taking narcotics.  No lifting or carrying greater than 10 lbs. until further directed by your surgeon. Avoid periods of inactivity such as sitting longer than an hour when not asleep. This helps prevent blood clots.  You may return to work once you are authorized by your doctor.     WEIGHT BEARING   Weight bearing as tolerated with assist device (walker, cane, etc) as directed, use it as long as suggested by your surgeon or therapist, typically at least 4-6 weeks.   EXERCISES  Results after joint replacement surgery are often greatly improved when you follow the exercise, range of motion and muscle strengthening exercises prescribed by your doctor. Safety measures are also important to protect the joint from further injury. Any time any of these exercises cause you to have increased pain or swelling, decrease what you are doing until you are comfortable again and then slowly increase them. If you have problems or questions, call your caregiver or physical therapist for advice.   Rehabilitation is important following a joint replacement. After just a few days of immobilization, the muscles of the leg can become weakened and shrink (atrophy).  These exercises are designed to build up the tone and strength of the thigh and leg muscles and to improve motion. Often times heat used for twenty to thirty minutes before working out will loosen up your tissues and help with improving the range of motion but do not use heat for the first two  weeks following surgery (sometimes heat can increase post-operative swelling).   These exercises can be done on a training (exercise) mat, on the floor, on a table or on a bed. Use whatever works the best and is most comfortable for you.    Use music or television while you are exercising so that the exercises are a pleasant break in your day. This will make your life better with the exercises acting as a break in your routine that you can look forward to.   Perform all exercises about fifteen times, three times per day or as directed.  You should exercise both the operative leg and the other leg as well.  Exercises include:   Quad Sets - Tighten up the muscle on the front of the  thigh (Quad) and hold for 5-10 seconds.   Straight Leg Raises - With your knee straight (if you were given a brace, keep it on), lift the leg to 60 degrees, hold for 3 seconds, and slowly lower the leg.  Perform this exercise against resistance later as your leg gets stronger.  Leg Slides: Lying on your back, slowly slide your foot toward your buttocks, bending your knee up off the floor (only go as far as is comfortable). Then slowly slide your foot back down until your leg is flat on the floor again.  Angel Wings: Lying on your back spread your legs to the side as far apart as you can without causing discomfort.  Hamstring Strength:  Lying on your back, push your heel against the floor with your leg straight by tightening up the muscles of your buttocks.  Repeat, but this time bend your knee to a comfortable angle, and push your heel against the floor.  You may put a pillow under the heel to make it more comfortable if necessary.   A rehabilitation program following joint replacement surgery can speed recovery and prevent re-injury in the future due to weakened muscles. Contact your doctor or a physical therapist for more information on knee rehabilitation.    CONSTIPATION  Constipation is defined medically as fewer than  three stools per week and severe constipation as less than one stool per week.  Even if you have a regular bowel pattern at home, your normal regimen is likely to be disrupted due to multiple reasons following surgery.  Combination of anesthesia, postoperative narcotics, change in appetite and fluid intake all can affect your bowels.   YOU MUST use at least one of the following options; they are listed in order of increasing strength to get the job done.  They are all available over the counter, and you may need to use some, POSSIBLY even all of these options:    Drink plenty of fluids (prune juice may be helpful) and high fiber foods Colace 100 mg by mouth twice a day  Senokot for constipation as directed and as needed Dulcolax (bisacodyl), take with full glass of water  Miralax (polyethylene glycol) once or twice a day as needed.  If you have tried all these things and are unable to have a bowel movement in the first 3-4 days after surgery call either your surgeon or your primary doctor.    If you experience loose stools or diarrhea, hold the medications until you stool forms back up.  If your symptoms do not get better within 1 week or if they get worse, check with your doctor.  If you experience "the worst abdominal pain ever" or develop nausea or vomiting, please contact the office immediately for further recommendations for treatment.   ITCHING:  If you experience itching with your medications, try taking only a single pain pill, or even half a pain pill at a time.  You can also use Benadryl over the counter for itching or also to help with sleep.   TED HOSE STOCKINGS:  Use stockings on both legs until for at least 2 weeks or as directed by physician office. They may be removed at night for sleeping.  MEDICATIONS:  See your medication summary on the "After Visit Summary" that nursing will review with you.  You may have some home medications which will be placed on hold until you complete the  course of blood thinner medication.  It is important for you to complete  the blood thinner medication as prescribed.  PRECAUTIONS:  If you experience chest pain or shortness of breath - call 911 immediately for transfer to the hospital emergency department.   If you develop a fever greater that 101 F, purulent drainage from wound, increased redness or drainage from wound, foul odor from the wound/dressing, or calf pain - CONTACT YOUR SURGEON.                                                   FOLLOW-UP APPOINTMENTS:  If you do not already have a post-op appointment, please call the office for an appointment to be seen by your surgeon.  Guidelines for how soon to be seen are listed in your "After Visit Summary", but are typically between 1-4 weeks after surgery.  OTHER INSTRUCTIONS:   Knee Replacement:  Do not place pillow under knee, focus on keeping the knee straight while resting. CPM instructions: 0-90 degrees, 2 hours in the morning, 2 hours in the afternoon, and 2 hours in the evening. Place foam block, curve side up under heel at all times except when in CPM or when walking.  DO NOT modify, tear, cut, or change the foam block in any way.  POST-OPERATIVE OPIOID TAPER INSTRUCTIONS: It is important to wean off of your opioid medication as soon as possible. If you do not need pain medication after your surgery it is ok to stop day one. Opioids include: Codeine, Hydrocodone(Norco, Vicodin), Oxycodone(Percocet, oxycontin) and hydromorphone amongst others.  Long term and even short term use of opiods can cause: Increased pain response Dependence Constipation Depression Respiratory depression And more.  Withdrawal symptoms can include Flu like symptoms Nausea, vomiting And more Techniques to manage these symptoms Hydrate well Eat regular healthy meals Stay active Use relaxation techniques(deep breathing, meditating, yoga) Do Not substitute Alcohol to help with tapering If you have been  on opioids for less than two weeks and do not have pain than it is ok to stop all together.  Plan to wean off of opioids This plan should start within one week post op of your joint replacement. Maintain the same interval or time between taking each dose and first decrease the dose.  Cut the total daily intake of opioids by one tablet each day Next start to increase the time between doses. The last dose that should be eliminated is the evening dose.   MAKE SURE YOU:  Understand these instructions.  Get help right away if you are not doing well or get worse.    Thank you for letting us be a part of your medical care team.  It is a privilege we respect greatly.  We hope these instructions will help you stay on track for a fast and full recovery!    Dental Antibiotics:  In most cases prophylactic antibiotics for Dental procdeures after total joint surgery are not necessary.  Exceptions are as follows:  1. History of prior total joint infection  2. Severely immunocompromised (Organ Transplant, cancer chemotherapy, Rheumatoid biologic meds such as [place])  3. Poorly controlled diabetes (A1C &gt; 8.0, blood glucose over 200)  If you have one of these conditions, contact your surgeon for an antibiotic prescription, prior to your dental procedure.   Increase activity slowly as tolerated   Complete by: As directed    Increase activity slowly as tolerated  Complete by: As directed    Post-operative opioid taper instructions:   Complete by: As directed    POST-OPERATIVE OPIOID TAPER INSTRUCTIONS: It is important to wean off of your opioid medication as soon as possible. If you do not need pain medication after your surgery it is ok to stop day one. Opioids include: Codeine, Hydrocodone(Norco, Vicodin), Oxycodone(Percocet, oxycontin) and hydromorphone amongst others.  Long term and even short term use of opiods can cause: Increased pain  response Dependence Constipation Depression Respiratory depression And more.  Withdrawal symptoms can include Flu like symptoms Nausea, vomiting And more Techniques to manage these symptoms Hydrate well Eat regular healthy meals Stay active Use relaxation techniques(deep breathing, meditating, yoga) Do Not substitute Alcohol to help with tapering If you have been on opioids for less than two weeks and do not have pain than it is ok to stop all together.  Plan to wean off of opioids This plan should start within one week post op of your joint replacement. Maintain the same interval or time between taking each dose and first decrease the dose.  Cut the total daily intake of opioids by one tablet each day Next start to increase the time between doses. The last dose that should be eliminated is the evening dose.      Post-operative opioid taper instructions:   Complete by: As directed    POST-OPERATIVE OPIOID TAPER INSTRUCTIONS: It is important to wean off of your opioid medication as soon as possible. If you do not need pain medication after your surgery it is ok to stop day one. Opioids include: Codeine, Hydrocodone(Norco, Vicodin), Oxycodone(Percocet, oxycontin) and hydromorphone amongst others.  Long term and even short term use of opiods can cause: Increased pain response Dependence Constipation Depression Respiratory depression And more.  Withdrawal symptoms can include Flu like symptoms Nausea, vomiting And more Techniques to manage these symptoms Hydrate well Eat regular healthy meals Stay active Use relaxation techniques(deep breathing, meditating, yoga) Do Not substitute Alcohol to help with tapering If you have been on opioids for less than two weeks and do not have pain than it is ok to stop all together.  Plan to wean off of opioids This plan should start within one week post op of your joint replacement. Maintain the same interval or time between taking  each dose and first decrease the dose.  Cut the total daily intake of opioids by one tablet each day Next start to increase the time between doses. The last dose that should be eliminated is the evening dose.           Follow-up Information     [facility] [place] [facility] Follow up.   Specialty: [place] Why: home health PT services will be provided by [facility], statr of care within48 hours post discharge Contact information: [address] [place] [address] [phone]                  Signed: Donella Stade 02/23/2021, 9:28 PM

## 2021-02-24 ENCOUNTER — Other Ambulatory Visit: Payer: Self-pay | Admitting: Surgical

## 2021-02-24 ENCOUNTER — Other Ambulatory Visit: Payer: Self-pay

## 2021-02-24 ENCOUNTER — Telehealth: Payer: Self-pay

## 2021-02-24 ENCOUNTER — Encounter: Payer: Self-pay | Admitting: Surgical

## 2021-02-24 ENCOUNTER — Ambulatory Visit (INDEPENDENT_AMBULATORY_CARE_PROVIDER_SITE_OTHER): Payer: Medicare HMO

## 2021-02-24 ENCOUNTER — Ambulatory Visit (INDEPENDENT_AMBULATORY_CARE_PROVIDER_SITE_OTHER): Payer: Medicare HMO | Admitting: Surgical

## 2021-02-24 ENCOUNTER — Ambulatory Visit (HOSPITAL_COMMUNITY)
Admission: RE | Admit: 2021-02-24 | Discharge: 2021-02-24 | Disposition: A | Discharge status: Home / Self Care | Payer: Medicare HMO | Source: Ambulatory Visit | Attending: Surgical | Admitting: Surgical

## 2021-02-24 DIAGNOSIS — Z96652 Presence of left artificial knee joint: Secondary | ICD-10-CM | POA: Diagnosis not present

## 2021-02-24 DIAGNOSIS — M7989 Other specified soft tissue disorders: Secondary | ICD-10-CM

## 2021-02-24 DIAGNOSIS — M79662 Pain in left lower leg: Secondary | ICD-10-CM | POA: Diagnosis not present

## 2021-02-24 MED ORDER — RIVAROXABAN (XARELTO) VTE STARTER PACK (15 & 20 MG): ORAL_TABLET | ORAL | 0 refills | Status: DC

## 2021-02-24 MED ORDER — OXYCODONE HCL 5 MG PO TABS: 5.0000 mg | ORAL_TABLET | Freq: Three times a day (TID) | ORAL | 0 refills | Status: DC | PRN

## 2021-02-24 NOTE — Progress Notes (Signed)
VASCULAR LAB    Left lower extremity venous duplex has been performed.  See CV proc for preliminary results.  [place], PA-C with report  Laritza Vokes, RVT 02/24/2021, 3:16 PM

## 2021-02-24 NOTE — Progress Notes (Signed)
Post-Op Visit Note   Patient: [name]           Date of Birth: [birth year]           MRN: [id number] Visit Date: 02/24/2021 PCP: [name], MD   Assessment & Plan:  Chief Complaint:  Chief Complaint  Patient presents with   Left Knee - Routine Post Op   Visit Diagnoses:  1. Status post total left knee replacement     Plan: 55 year old male who presents s/p left total knee arthroplasty on 02/10/2021.  Reports pain is moderate but controlled.  He is taking pain medicine 2-3 times per day.  He is up to 90 degrees on the CPM machine.  He has transitioned from walker to walking with a cane.  Denies any chest pain or shortness of breath.  No abdominal pain.  Having regular bowel movements and is back to his baseline in this regard.  Taking aspirin for DVT prophylaxis.  He does report some calf pain on the operative leg that has not really changed since time of surgery.  On examination patient has 0 degrees extension and 90 degrees of knee flexion.  Excellent quadricep strength rated 5/5.  Able to perform multiple straight leg raises with about 3 degrees extensor lag.  Incision is healing well without any evidence of infection or dehiscence.  Steri-Strips redressed.  Negative Homans' sign.  Positive calf tenderness with increased calf swelling compared with the contralateral leg.  Plan for ultrasound to rule out DVT of the left lower extremity.  He has no history of prior DVT/PE.  Does have pretty compelling calf tenderness on exam as well as bruising and swelling through the operative leg calf.  Call him with these results.  He will be set up for physical therapy at [facility] in [place]; prescription given to him.  Doing well and home health physical therapy and they have gotten him to 100 degrees of flexion.  Overall he is doing very well following surgery and plan to see him back in 4 weeks for clinical recheck with Dr. Marlou Sa.  Follow-Up Instructions: No follow-ups on file.    Orders:  Orders Placed This Encounter  Procedures   XR Knee 1-2 Views Left   No orders of the defined types were placed in this encounter.   Imaging: No results found.  PMFS History: Patient Active Problem List   Diagnosis Date Noted   Arthritis of left knee    Osteoarthritis 02/10/2021   S/P TKR (total knee replacement), left 02/10/2021   Grief counseling 11/14/2020   Abnormal electrocardiogram (ECG) (EKG) 09/02/2020   Preoperative evaluation to rule out surgical contraindication 09/02/2020   MDD (major depressive disorder), recurrent episode, moderate ([place]) 05/29/2020   Colon cancer screening 02/02/2020   Eczema of both external ears 12/08/2019   1+ pitting edema 12/08/2019   Polyarthritis of multiple sites 10/27/2019   Coronary artery calcification seen on CT scan 10/27/2019   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 ([place]) 08/26/2019   Radiculopathy, cervical region 06/10/2019   Carpal tunnel syndrome, left upper limb 06/10/2019   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   Spinal stenosis of cervical region 12/31/2018   Loose body of left knee    Plantar fasciitis of left foot 08/24/2017   Chronic pain of both hips 06/20/2017   Complete rotator cuff tear of left shoulder 09/14/2015   Hypothyroidism due to acquired atrophy of thyroid 09/14/2015   Arthritis of knee 06/23/2014   Encounter for  preventive health examination 06/17/2014   Enlarged RV (right ventricle) 06/17/2014   GERD (gastroesophageal reflux disease) 04/20/2014   Cervical spine degeneration 02/17/2014   Overweight 10/29/2013   Chronic knee pain 10/29/2013   Type 2 diabetes mellitus with diabetic neuropathy, unspecified ([place]) 10/23/2012   Seborrheic dermatitis, unspecified 05/17/2012   Fatty liver 04/22/2012   Hypogonadism male 03/12/2012   Chronic pain of multiple joints 12/24/2010   Sciatica of left side associated with disorder of lumbar spine 12/24/2010    Hyperlipidemia associated with type 2 diabetes mellitus ([place]) 01/13/2009   Anxiety state 01/13/2009   Essential hypertension 01/13/2009   OSA (obstructive sleep apnea) 01/13/2009   Past Medical History:  Diagnosis Date   Acute medial meniscus tear of left knee    Alcohol-induced chronic pancreatitis ([place]) 11/20/2016   Anxiety    Anxiety and depression    Arthritis    knees and shoulders   Benzodiazepine withdrawal with complication ([place]) 16/01/9603   Bruit    L   Chest pain    hx   Cognitive complaints with normal neuropsychological exam 10/23/2016   Diabetes mellitus without complication ([facility])    "borderline", diet controlled, no meds, patient has lost 30 lbs   Edema    Fatty liver    GERD (gastroesophageal reflux disease)    uses Omeprazole   Goiter    HLD (hyperlipidemia)    Hypertension    essential, benign   Hypothyroidism    Impotence of organic origin    Murmur    never has caused any problems   Neuromuscular disorder ([place])    Other chest pain    tightness, pressure   Palpitation    hx   Precordial pain    Sleep apnea    uses cpap    Family History  Problem Relation Age of Onset   Heart disease Father    Colon cancer Neg Hx    Colon polyps Neg Hx    Esophageal cancer Neg Hx    Rectal cancer Neg Hx    Stomach cancer Neg Hx     Past Surgical History:  Procedure Laterality Date   APPENDECTOMY  2002   done at [facility] N/A 08/27/2015   Procedure: Left Heart Cath and Coronary Angiography;  Surgeon: Jolaine Artist, MD;  Location: [facility];  Service: Cardiovascular;  Laterality: N/A;   COLONOSCOPY  in his 20's   EAR CYST EXCISION Left 07/08/2019   Procedure: open excision baker's cyst left knee;  Surgeon: Meredith Pel, MD;  Location: [facility];  Service: Orthopedics;  Laterality: Left;   HERNIA REPAIR  5-40-98   umbilical   JOINT REPLACEMENT     right knee   KNEE ARTHROSCOPY Left 12/17/2018    Procedure: left knee arthroscopy, meniscal debridement, loose body removal;  Surgeon: Meredith Pel, MD;  Location: [facility];  Service: Orthopedics;  Laterality: Left;   REVISION TOTAL KNEE ARTHROPLASTY Right 06/23/2014   DR DEAN   TONSILLECTOMY     TOTAL KNEE ARTHROPLASTY Right 2011   right   TOTAL KNEE ARTHROPLASTY Left 02/10/2021   Procedure: LEFT TOTAL KNEE ARTHROPLASTY;  Surgeon: Meredith Pel, MD;  Location: [facility];  Service: Orthopedics;  Laterality: Left;   TOTAL KNEE REVISION Right 06/23/2014   Procedure: TOTAL KNEE REVISION;  Surgeon: Meredith Pel, MD;  Location: [facility];  Service: Orthopedics;  Laterality: Right;   UPPER GASTROINTESTINAL ENDOSCOPY  04/30/2013  Sharlett Iles    WISDOM TOOTH EXTRACTION     Social History   Occupational History   Occupation: Personnel officer: Public relations account executive   Occupation: SERVICE DIRECTOR    Employer: [employer]  Tobacco Use   Smoking status: Former    Packs/day: 2.00    Years: 18.00    Pack years: 36.00    Types: Cigarettes    Quit date: 04/24/2005    Years since quitting: 15.8   Smokeless tobacco: Never   Tobacco comments:    quit 20 years ago   Vaping Use   Vaping Use: Never used  Substance and Sexual Activity   Alcohol use: Yes    Alcohol/week: 4.0 standard drinks    Types: 4 Cans of beer per week   Drug use: No   Sexual activity: Yes

## 2021-02-24 NOTE — Telephone Encounter (Signed)
FYI-  Patient is positive for DVT.  [name] spoke with [employer] today.

## 2021-02-28 DIAGNOSIS — M25662 Stiffness of left knee, not elsewhere classified: Secondary | ICD-10-CM | POA: Diagnosis not present

## 2021-02-28 DIAGNOSIS — M25562 Pain in left knee: Secondary | ICD-10-CM | POA: Diagnosis not present

## 2021-03-02 ENCOUNTER — Other Ambulatory Visit: Payer: Self-pay | Admitting: Internal Medicine

## 2021-03-02 DIAGNOSIS — F331 Major depressive disorder, recurrent, moderate: Secondary | ICD-10-CM

## 2021-03-02 DIAGNOSIS — F411 Generalized anxiety disorder: Secondary | ICD-10-CM

## 2021-03-03 ENCOUNTER — Telehealth (HOSPITAL_BASED_OUTPATIENT_CLINIC_OR_DEPARTMENT_OTHER): Payer: Medicare HMO | Admitting: Psychiatry

## 2021-03-03 ENCOUNTER — Other Ambulatory Visit: Payer: Self-pay

## 2021-03-03 ENCOUNTER — Encounter (HOSPITAL_COMMUNITY): Payer: Self-pay | Admitting: Psychiatry

## 2021-03-03 VITALS — Wt 216.0 lb

## 2021-03-03 DIAGNOSIS — F419 Anxiety disorder, unspecified: Secondary | ICD-10-CM

## 2021-03-03 DIAGNOSIS — M25562 Pain in left knee: Secondary | ICD-10-CM | POA: Diagnosis not present

## 2021-03-03 DIAGNOSIS — F331 Major depressive disorder, recurrent, moderate: Secondary | ICD-10-CM

## 2021-03-03 DIAGNOSIS — M25662 Stiffness of left knee, not elsewhere classified: Secondary | ICD-10-CM | POA: Diagnosis not present

## 2021-03-03 MED ORDER — BUPROPION HCL ER (XL) 300 MG PO TB24: 300.0000 mg | ORAL_TABLET | Freq: Every day | ORAL | 2 refills | Status: DC

## 2021-03-03 MED ORDER — ARIPIPRAZOLE 2 MG PO TABS
2.0000 mg | ORAL_TABLET | Freq: Every day | ORAL | 2 refills | Status: DC
Start: 2021-03-03 — End: 2021-04-01

## 2021-03-03 NOTE — Progress Notes (Signed)
Virtual Visit via Telephone Note  I connected with [name] on 03/03/21 at  9:00 AM EST by telephone and verified that I am speaking with the correct person using two identifiers.  Location: Patient: Home Provider: Home Office   I discussed the limitations, risks, security and privacy concerns of performing an evaluation and management service by telephone and the availability of in person appointments. I also discussed with the patient that there may be a patient responsible charge related to this service. The patient expressed understanding and agreed to proceed.   History of Present Illness: Patient is evaluated by phone session.  He started grief counseling through hospice and he noticed some improvement in his sadness and increased.  He had left knee arthroplasty in October and now he is getting physical therapy.  He is feeling better and started driving every day.  He sold his camper and now moving to [place] close to his family.  His mother brother and other family members live in [place].  He still misses his wife who died in 11-04-22 due to chronic illness.  Patient is taking all his medication however I review discharge summary and it appears that his Klonopin, trazodone, BuSpar and Abilify was discontinued.  Patient is not aware why it was discontinued as he is still taking it.  He is taking Abilify which is helping his depression and anxiety.  He is sleeping better.  He denies any crying spells or any feeling of hopelessness or suicidal thoughts.  He is hoping once he move to [facility] close to his family he will be more relief due to available support system.  He denies drinking or using any illegal substances.  His appetite is okay.  He reported his weight is stable around 216 however as per EMR his weight was 231 which patient did not agree.  Patient reported his energy level is good.  He like to keep the current medication which is Wellbutrin and Abilify from [place].  He denies any  mania, paranoia or any hallucination.   Past Psychiatric History: Reviewed. H/O depression and anxiety.  No h/o suicidal attempt or inpatient treatment.  Seen Dr. Jake Michaelis and given Zoloft, Klonopin and Cymbalta.  Later PCP added BuSpar and trazodone.  We tried Lamictal but did not help.   Psychiatric Specialty Exam: Physical Exam  Review of Systems  Weight 216 lb (98 kg).There is no height or weight on file to calculate BMI.  General Appearance: NA  Eye Contact:  NA  Speech:  Slow  Volume:  Decreased  Mood:  Anxious  Affect:  NA  Thought Process:  Goal Directed  Orientation:  Full (Time, Place, and Person)  Thought Content:  Rumination  Suicidal Thoughts:  No  Homicidal Thoughts:  No  Memory:  Immediate;   Good Recent;   Good Remote;   Fair  Judgement:  Intact  Insight:  Present  Psychomotor Activity:  NA  Concentration:  Concentration: Fair and Attention Span: Fair  Recall:  Good  Fund of Knowledge:  Good  Language:  Good  Akathisia:  No  Handed:  Right  AIMS (if indicated):     Assets:  Communication Skills Desire for Improvement Housing Resilience  ADL's:  Intact  Cognition:  WNL  Sleep:   6 hrs      Assessment and Plan: Major depressive disorder, recurrent.  Anxiety.  Reviewed blood work results.  Last hemoglobin A1c is 6.5 which is stable.  It is unclear why his Abilify, trazodone, BuSpar  and Klonopin was discontinued at the time of discharge from hospital.  He was getting BuSpar, Klonopin, trazodone from his PCP Dr. Kalman Jewels.  I recommend have his PCP contact the physician who discontinued all his medication.  Patient like to go back on Abilify which had helped him a lot.  He used to take 5 mg but we will restart 2 mg for now and I recommend if he noticed some concern side effects and call us back.  I will also continue Wellbutrin which he has been taking regularly.  Encouraged to continue hospice therapy.  Recommended to call us back if there is any question or any  concern.  Follow-up in 3 months.  Follow Up Instructions:    I discussed the assessment and treatment plan with the patient. The patient was provided an opportunity to ask questions and all were answered. The patient agreed with the plan and demonstrated an understanding of the instructions.   The patient was advised to call back or seek an in-person evaluation if the symptoms worsen or if the condition fails to improve as anticipated.  I provided 20 minutes of non-face-to-face time during this encounter.   Kathlee Nations, MD

## 2021-03-04 ENCOUNTER — Encounter: Payer: Self-pay | Admitting: Internal Medicine

## 2021-03-04 ENCOUNTER — Other Ambulatory Visit: Payer: Self-pay

## 2021-03-04 ENCOUNTER — Ambulatory Visit (INDEPENDENT_AMBULATORY_CARE_PROVIDER_SITE_OTHER): Payer: Medicare HMO | Admitting: Internal Medicine

## 2021-03-04 VITALS — BP 122/74 | HR 67 | Temp 98.2°F | Ht 72.0 in | Wt 218.2 lb

## 2021-03-04 DIAGNOSIS — I824Z2 Acute embolism and thrombosis of unspecified deep veins of left distal lower extremity: Secondary | ICD-10-CM | POA: Diagnosis not present

## 2021-03-04 DIAGNOSIS — I82A12 Acute embolism and thrombosis of left axillary vein: Secondary | ICD-10-CM

## 2021-03-04 DIAGNOSIS — F411 Generalized anxiety disorder: Secondary | ICD-10-CM

## 2021-03-04 DIAGNOSIS — E1169 Type 2 diabetes mellitus with other specified complication: Secondary | ICD-10-CM | POA: Diagnosis not present

## 2021-03-04 DIAGNOSIS — G894 Chronic pain syndrome: Secondary | ICD-10-CM | POA: Diagnosis not present

## 2021-03-04 DIAGNOSIS — F331 Major depressive disorder, recurrent, moderate: Secondary | ICD-10-CM | POA: Diagnosis not present

## 2021-03-04 DIAGNOSIS — E785 Hyperlipidemia, unspecified: Secondary | ICD-10-CM | POA: Diagnosis not present

## 2021-03-04 MED ORDER — HYDROCODONE-ACETAMINOPHEN 10-325 MG PO TABS: 1.0000 | ORAL_TABLET | Freq: Four times a day (QID) | ORAL | 0 refills | Status: DC | PRN

## 2021-03-04 MED ORDER — CLONAZEPAM 1 MG PO TABS: ORAL_TABLET | ORAL | 2 refills | Status: DC

## 2021-03-04 MED ORDER — BUSPIRONE HCL 15 MG PO TABS: 15.0000 mg | ORAL_TABLET | Freq: Three times a day (TID) | ORAL | 5 refills | Status: DC

## 2021-03-04 NOTE — Progress Notes (Signed)
Subjective:  Patient ID: [name], male    DOB: 09-22-1965  Age: 56 y.o. MRN: [id number]  CC: The primary encounter diagnosis was DVT of axillary vein, acute left ([place]). Diagnoses of MDD (major depressive disorder), recurrent episode, moderate ([place]), GAD (generalized anxiety disorder), Chronic pain associated with significant psychosocial dysfunction, DVT, lower extremity, distal, acute, left ([place]), Hyperlipidemia associated with type 2 diabetes mellitus ([place]), and Anxiety state were also pertinent to this visit.  HPI [name] presents for follow up after undergoing knee replacement  Chief Complaint  Patient presents with   Follow-up    After knee replacement & recent blood clots.    This visit occurred during the SARS-CoV-2 public health emergency.  Safety protocols were in place, including screening questions prior to the visit, additional usage of staff PPE, and extensive cleaning of exam room while observing appropriate contact time as indicated for disinfecting solutions.   [name] was admitted to [facility] on Oct 20 and Underwent  an elective knee arthroplasty, discharged home on Oct 22  with oxycodone rx l for postoperative pain managment .  His last refill was  Nov 3 for #30  (10 day supply) .  For unclear reasons he was not prescribed Clonazepam  during the hospiitralization despite a documented history of benzodiazepine dependence, an dit was  discontinued at discharge.  He has resumed it twice daily using his remaining supply to avoid withdrawal (missed 2 days of medication during admission )  .  His last refill on clonazepam was Oct 3 for #60   Buspirone and hydrocodone were also discontinued and he feels more anxious with use of buspirone.  He has used the 10 oxycodone from the Nov 3 refill, which should have lasted until Nov 13.     At surger follow up he was diagnosed with a postoperative DVT by  U/s which  was done due to development of pain in calf. [facility] [place].   LEFT: - Findings consistent with acute deep vein thrombosis involving the left peroneal veins and left posterior tibial veins.  He was prescribed and is taking Xarelto, prescribed by PA for Dr Marlou Sa.  He is not sure how long he is supposed to be on therapy bc Dr Marlou Sa has referred him to me for management.  .  Monthly cost of Xarelto s $45 /month and he is financially stressed due to the recent death of his wife [name] after years of  high cost medical care, and his own disability  Outpatient Medications Prior to Visit  Medication Sig Dispense Refill   acetaminophen (TYLENOL) 325 MG tablet Take 1-2 tablets (325-650 mg total) by mouth every 6 (six) hours as needed for mild pain (pain score 1-3 or temp > 100.5). 30 tablet 0   amLODipine (NORVASC) 5 MG tablet Take 1 tablet by mouth once daily 90 tablet 0   ARIPiprazole (ABILIFY) 2 MG tablet Take 1 tablet (2 mg total) by mouth daily. 30 tablet 2   atorvastatin (LIPITOR) 80 MG tablet Take 80 mg by mouth daily.     blood glucose meter kit and supplies Dispense based on patient and insurance preference. Use to check blood sugars once daily. 1 each 0   buPROPion (WELLBUTRIN XL) 300 MG 24 hr tablet Take 1 tablet (300 mg total) by mouth daily. 30 tablet 2   clobetasol (TEMOVATE) 0.05 % external solution Apply 1 application topically 2 (two) times daily as needed (scalp  irritation).     Clobetasol Propionate 0.05 % shampoo Apply to dry scalp and leave on for 15 minutes (Patient taking differently: Apply 1 application topically 2 (two) times a week. Apply to dry scalp and leave on for 15 minutes as needed for itching.) 118 mL 5   clonazePAM (KLONOPIN) 1 MG tablet Take 1 mg by mouth daily.     dicyclomine (BENTYL) 20 MG tablet TAKE 1 TABLET BY MOUTH EVERY 6 HOURS 120 tablet 0   docusate sodium (COLACE) 100 MG capsule Take 1 capsule (100 mg total) by mouth 2 (two) times daily. 10 capsule 0   ezetimibe (ZETIA) 10 MG tablet Take 1 tablet (10 mg  total) by mouth daily. 90 tablet 3   furosemide (LASIX) 20 MG tablet Take 1 tablet (20 mg total) by mouth every other day. 45 tablet 3   gabapentin (NEURONTIN) 600 MG tablet TAKE 1 TABLET BY MOUTH THREE TIMES DAILY 270 tablet 0   glucose blood test strip Use once daily to check BS at various times  E11.9 100 each 12   levothyroxine (SYNTHROID) 88 MCG tablet TAKE 1 TABLET BY MOUTH ONCE DAILY BEFORE BREAKFAST 90 tablet 0   losartan (COZAAR) 100 MG tablet Take 1 tablet (100 mg total) by mouth daily. 90 tablet 3   metFORMIN (GLUCOPHAGE) 500 MG tablet Take 1 tablet (500 mg total) by mouth 2 (two) times daily with a meal. 180 tablet 3   methocarbamol (ROBAXIN) 500 MG tablet Take 1 tablet (500 mg total) by mouth every 8 (eight) hours as needed. 90 tablet 0   metoprolol tartrate (LOPRESSOR) 25 MG tablet Take 1 tablet by mouth twice daily 180 tablet 0   Multiple Vitamin (MULTIVITAMIN WITH MINERALS) TABS tablet Take 1 tablet by mouth daily.     omeprazole (PRILOSEC) 40 MG capsule TAKE 1 CAPSULE BY MOUTH IN THE MORNING AND AT BEDTIME 180 capsule 0   promethazine (PHENERGAN) 12.5 MG tablet Take 1 tablet (12.5 mg total) by mouth every 4 (four) hours as needed for nausea or vomiting. (Patient taking differently: Take 12.5 mg by mouth every 8 (eight) hours as needed for nausea or vomiting.) 20 tablet 0   RIVAROXABAN (XARELTO) VTE STARTER PACK (15 & 20 MG) Follow package directions: Take one 38m tablet by mouth twice a day. On day 22, switch to one 233mtablet once a day. Take with food. 51 each 0   busPIRone (BUSPAR) 15 MG tablet Take 15 mg by mouth 3 (three) times daily.     HYDROcodone-acetaminophen (NORCO) 10-325 MG tablet Take 1 tablet by mouth in the morning and at bedtime. Take one tablet by mouth twice daily as needed for pain.     oxyCODONE (OXY IR/ROXICODONE) 5 MG immediate release tablet Take 1 tablet (5 mg total) by mouth every 8 (eight) hours as needed for moderate pain (pain score 4-6). (Patient not  taking: Reported on 03/04/2021) 30 tablet 0   No facility-administered medications prior to visit.    Review of Systems;  Patient denies headache, fevers, malaise, unintentional weight loss, skin rash, eye pain, sinus congestion and sinus pain, sore throat, dysphagia,  hemoptysis , cough, dyspnea, wheezing, chest pain, palpitations, orthopnea, edema, abdominal pain, nausea, melena, diarrhea, constipation, flank pain, dysuria, hematuria, urinary  Frequency, nocturia, numbness, tingling, seizures,  Focal weakness, Loss of consciousness,  Tremor, insomnia, depression, anxiety, and suicidal ideation.      Objective:  BP 122/74 (BP Location: Left Arm, Patient Position: Sitting, Cuff Size: Normal)  Pulse 67   Temp 98.2 F (36.8 C) (Oral)   Ht 6' (1.829 m)   Wt 218 lb 3.2 oz (99 kg)   SpO2 97%   BMI 29.59 kg/m   BP Readings from Last 3 Encounters:  03/04/21 122/74  02/12/21 106/63  02/07/21 (!) 149/88    Wt Readings from Last 3 Encounters:  03/04/21 218 lb 3.2 oz (99 kg)  03/03/21 216 lb (98 kg)  02/10/21 231 lb 8 oz (105 kg)    General appearance: alert, cooperative and appears stated age Ears: normal TM's and external ear canals both ears Throat: lips, mucosa, and tongue normal; teeth and gums normal Neck: no adenopathy, no carotid bruit, supple, symmetrical, trachea midline and thyroid not enlarged, symmetric, no tenderness/mass/nodules Back: symmetric, no curvature. ROM normal. No CVA tenderness. Lungs: clear to auscultation bilaterally Heart: regular rate and rhythm, S1, S2 normal, no murmur, click, rub or gallop Abdomen: soft, non-tender; bowel sounds normal; no masses,  no organomegaly Pulses: 2+ and symmetric Skin: Skin color, texture, turgor normal. No rashes or lesions. Left knee incision clean, without dehiscence or erythema Lymph nodes: Cervical, supraclavicular, and axillary nodes normal.  Lab Results  Component Value Date   HGBA1C 6.5 (H) 02/07/2021   HGBA1C  6.6 (H) 12/16/2020   HGBA1C 6.9 (H) 09/02/2020    Lab Results  Component Value Date   CREATININE 0.90 02/11/2021   CREATININE 0.92 02/07/2021   CREATININE 0.98 12/16/2020    Lab Results  Component Value Date   WBC 10.9 (H) 02/12/2021   HGB 11.9 (L) 02/12/2021   HCT 34.4 (L) 02/12/2021   PLT 189 02/12/2021   GLUCOSE 147 (H) 02/11/2021   CHOL 152 12/16/2020   TRIG 215.0 (H) 12/16/2020   HDL 32.90 (L) 12/16/2020   LDLDIRECT 99.0 12/16/2020   LDLCALC 84 05/28/2020   ALT 23 12/16/2020   AST 20 12/16/2020   NA 136 02/11/2021   K 3.7 02/11/2021   CL 99 02/11/2021   CREATININE 0.90 02/11/2021   BUN 9 02/11/2021   CO2 31 02/11/2021   TSH 2.41 05/28/2020   PSA 0.70 10/16/2017   INR 0.98 08/27/2015   HGBA1C 6.5 (H) 02/07/2021   MICROALBUR <0.7 05/28/2020    XR Knee 1-2 Views Left  Result Date: 02/24/2021 AP and lateral views of left knee reviewed.  Total knee prosthesis in excellent position and alignment without any complicating features.  No periprosthetic fracture or lucency noted.  No patella alta or patella baja.  VAS [place] LOWER EXTREMITY VENOUS (DVT)  Result Date: 02/24/2021  Lower Venous DVT Study Patient Name:  [name]  Date of Exam:   02/24/2021 Medical Rec #: [id number]        Accession #:    [id number] Date of Birth: Mar 22, 1966        Patient Gender: M Patient Age:   37 years Exam Location:  [facility] Procedure:      VAS [place] LOWER EXTREMITY VENOUS (DVT) Referring Phys: Gloriann Loan --------------------------------------------------------------------------------  Indications: Pain, Swelling, and Status post left total knee replacement.  Comparison Study: Prior negative left LEV done 07/17/19 Performing Technologist: Sharion Dove RVS  Examination Guidelines: A complete evaluation includes B-mode imaging, spectral Doppler, color Doppler, and power Doppler as needed of all accessible portions of each vessel. Bilateral testing is considered an integral part of a  complete examination. Limited examinations for reoccurring indications may be performed as noted. The reflux portion of the exam is performed with the patient in  reverse Trendelenburg.  +-----+---------------+---------+-----------+----------+--------------+ RIGHTCompressibilityPhasicitySpontaneityPropertiesThrombus Aging +-----+---------------+---------+-----------+----------+--------------+ CFV  Full           Yes      Yes                                 +-----+---------------+---------+-----------+----------+--------------+   +---------+---------------+---------+-----------+----------+--------------+ LEFT     CompressibilityPhasicitySpontaneityPropertiesThrombus Aging +---------+---------------+---------+-----------+----------+--------------+ CFV      Full           Yes      Yes                                 +---------+---------------+---------+-----------+----------+--------------+ SFJ      Full                                                        +---------+---------------+---------+-----------+----------+--------------+ FV Prox  Full                                                        +---------+---------------+---------+-----------+----------+--------------+ FV Mid   Full                                                        +---------+---------------+---------+-----------+----------+--------------+ FV DistalFull                                                        +---------+---------------+---------+-----------+----------+--------------+ PFV      Full                                                        +---------+---------------+---------+-----------+----------+--------------+ POP      Full           Yes      Yes                                 +---------+---------------+---------+-----------+----------+--------------+ PTV      None                                         Acute           +---------+---------------+---------+-----------+----------+--------------+ PERO     None                                         Acute          +---------+---------------+---------+-----------+----------+--------------+  Summary: RIGHT: - No evidence of common femoral vein obstruction.  LEFT: - Findings consistent with acute deep vein thrombosis involving the left peroneal veins, and left posterior tibial veins.  *See table(s) above for measurements and observations. Electronically signed by Jamelle Haring on 02/24/2021 at 3:20:39 PM.    Final     Assessment & Plan:   Problem List Items Addressed This Visit     Hyperlipidemia associated with type 2 diabetes mellitus ([place])    Hi is risk for CAD is managed with atorvastatin 20 mg daily. LDL is < 100 and is diabetes is well controlled on current regimen.  He will return for fasting labs in February   Lab Results  Component Value Date   CHOL 152 12/16/2020   HDL 32.90 (L) 12/16/2020   LDLCALC 84 05/28/2020   LDLDIRECT 99.0 12/16/2020   TRIG 215.0 (H) 12/16/2020   CHOLHDL 5 12/16/2020   Lab Results  Component Value Date   HGBA1C 6.5 (H) 02/07/2021         Anxiety state    Aggravated by his wife [name] death,   his loss of employment and the consequent financial hardship. His anxiety is managed with scheduled clonazepam, buspirone And trazodone 150 mg qhs.  His has recently been awarded disability , which has alleviated the financial strain. Will encourage him to consider actively trying to reduce his dependance on  clonazepam      Relevant Medications   busPIRone (BUSPAR) 15 MG tablet   MDD (major depressive disorder), recurrent episode, moderate (HCC)   Relevant Medications   busPIRone (BUSPAR) 15 MG tablet   Chronic pain associated with significant psychosocial dysfunction    He will resume management of pain involving both knees  Cervical spine and shoulders with hydrocodone, which was refilled today Refill history  confirmed via [place] Controlled Substance databas, accessed by me today..      Relevant Medications   clonazePAM (KLONOPIN) 1 MG tablet   clonazePAM (KLONOPIN) 1 MG tablet   HYDROcodone-acetaminophen (NORCO) 10-325 MG tablet   DVT, lower extremity, distal, acute, left (HCC)    Postoperative occurring in late October  after elective left knee replacement.  Currently managed with Xarelto with minimum treatment 3 months.  Will repeat U/s at [facility] at 3 months prior to stopping therapy.  Referral to Catie Darnelle Maffucci for alternative  therapy given his financial hardship      Other Visit Diagnoses     DVT of axillary vein, acute left ([place])    -  Primary   Relevant Orders   AMB Referral to Community Care Coordinaton   GAD (generalized anxiety disorder)       Relevant Medications   busPIRone (BUSPAR) 15 MG tablet       I have discontinued Joshua Shields. Hackler "Neil"'s oxyCODONE and busPIRone. I have also changed his busPIRone. Additionally, I am having him maintain his multivitamin with minerals, Clobetasol Propionate, clobetasol, glucose blood, blood glucose meter kit and supplies, furosemide, promethazine, metFORMIN, omeprazole, ezetimibe, losartan, gabapentin, methocarbamol, levothyroxine, atorvastatin, docusate sodium, acetaminophen, amLODipine, Rivaroxaban Stater Pack (15 mg and 20 mg), metoprolol tartrate, dicyclomine, buPROPion, ARIPiprazole, clonazePAM, clonazePAM, and HYDROcodone-acetaminophen.  Meds ordered this encounter  Medications   clonazePAM (KLONOPIN) 1 MG tablet    Sig: TAKE 1 TABLET BY MOUTH TWICE DAILY AS NEEDED FOR ANXIETY    Dispense:  60 tablet    Refill:  2   HYDROcodone-acetaminophen (NORCO) 10-325 MG tablet    Sig: Take 1 tablet by  mouth every 6 (six) hours as needed.    Dispense:  60 tablet    Refill:  0   busPIRone (BUSPAR) 15 MG tablet    Sig: Take 1 tablet (15 mg total) by mouth 3 (three) times daily.    Dispense:  90 tablet    Refill:  5     Medications Discontinued During This Encounter  Medication Reason   oxyCODONE (OXY IR/ROXICODONE) 5 MG immediate release tablet Completed Course   HYDROcodone-acetaminophen (NORCO) 10-325 MG tablet    busPIRone (BUSPAR) 15 MG tablet     Follow-up: Return in about 3 months (around 06/04/2021).   [name], MD

## 2021-03-04 NOTE — Patient Instructions (Signed)
I HAVE REFILLED THE CLONAZEPAM,  HYDROCODONE AND BUSPIRONE   You will be taking Xarelto or something similar for a minimum of 3 months  .  We will repeat the ultrasound before stopping anticoagulation to confirm resolution of DVT    I am letting you know that I am referring to our clinical pharmacist ,  Catie Darnelle Maffucci.  Catie helps me provide additional services to my patients who are on  fixed incomes  and dealing with  chronic diseases , like diabetes.  I do not expect this referral to cost you anything out of pocket, but I do think Catie will be able to help you get your diabetes under control and maximize your drug benefits.  She will make contact with  You by phone in the next week

## 2021-03-05 ENCOUNTER — Other Ambulatory Visit: Payer: Self-pay | Admitting: Internal Medicine

## 2021-03-05 DIAGNOSIS — Z86718 Personal history of other venous thrombosis and embolism: Secondary | ICD-10-CM | POA: Insufficient documentation

## 2021-03-05 DIAGNOSIS — I824Z2 Acute embolism and thrombosis of unspecified deep veins of left distal lower extremity: Secondary | ICD-10-CM | POA: Insufficient documentation

## 2021-03-05 DIAGNOSIS — G894 Chronic pain syndrome: Secondary | ICD-10-CM | POA: Insufficient documentation

## 2021-03-05 MED ORDER — TRAZODONE HCL 100 MG PO TABS: ORAL_TABLET | ORAL | 0 refills | Status: DC

## 2021-03-05 NOTE — Assessment & Plan Note (Addendum)
Postoperative occurring in late October  after elective left knee replacement.  Currently managed with Xarelto with minimum treatment 3 months.  Will repeat U/s at [facility] at 3 months prior to stopping therapy.  Referral to Catie Darnelle Maffucci for alternative  therapy given his financial hardship

## 2021-03-05 NOTE — Assessment & Plan Note (Signed)
Hi is risk for CAD is managed with atorvastatin 20 mg daily. LDL is < 100 and is diabetes is well controlled on current regimen.  He will return for fasting labs in February   Lab Results  Component Value Date   CHOL 152 12/16/2020   HDL 32.90 (L) 12/16/2020   LDLCALC 84 05/28/2020   LDLDIRECT 99.0 12/16/2020   TRIG 215.0 (H) 12/16/2020   CHOLHDL 5 12/16/2020   Lab Results  Component Value Date   HGBA1C 6.5 (H) 02/07/2021

## 2021-03-05 NOTE — Assessment & Plan Note (Signed)
Aggravated by his wife Tanya's death,   his loss of employment and the consequent financial hardship. His anxiety is managed with scheduled clonazepam, buspirone And trazodone 150 mg qhs.  His has recently been awarded disability , which has alleviated the financial strain. Will encourage him to consider actively trying to reduce his dependance on  clonazepam

## 2021-03-05 NOTE — Assessment & Plan Note (Signed)
He will resume management of pain involving both knees  Cervical spine and shoulders with hydrocodone, which was refilled today Refill history confirmed via [place] Controlled Substance databas, accessed by me today.[name]

## 2021-03-07 ENCOUNTER — Telehealth: Payer: Self-pay

## 2021-03-07 NOTE — Chronic Care Management (AMB) (Signed)
  Chronic Care Management   Note  03/07/2021 Name: SHAN VALDES MRN: [id number] DOB: October 30, 1965  KEATEN MASHEK is a 55 y.o. year old male who is a primary care patient of Derrel Nip, Aris Everts, MD. I reached out to Sunday Corn by phone today in response to a referral sent by Mr. Laura Radilla Granberry's PCP.  Mr. Kings was given information about Chronic Care Management services today including:  CCM service includes personalized support from designated clinical staff supervised by his physician, including individualized plan of care and coordination with other care providers 24/7 contact phone numbers for assistance for urgent and routine care needs. Service will only be billed when office clinical staff spend 20 minutes or more in a month to coordinate care. Only one practitioner may furnish and bill the service in a calendar month. The patient may stop CCM services at any time (effective at the end of the month) by phone call to the office staff. The patient is responsible for co-pay (up to 20% after annual deductible is met) if co-pay is required by the individual health plan.   Patient agreed to services and verbal consent obtained.   Follow up plan: Telephone appointment with care management team member scheduled for:03/15/2021  Noreene Larsson, [address], [place], [place] [address] Direct Dial: [phone] [email or web address]: [email or web address]

## 2021-03-08 DIAGNOSIS — M25662 Stiffness of left knee, not elsewhere classified: Secondary | ICD-10-CM | POA: Diagnosis not present

## 2021-03-08 DIAGNOSIS — M25562 Pain in left knee: Secondary | ICD-10-CM | POA: Diagnosis not present

## 2021-03-08 DIAGNOSIS — M1991 Primary osteoarthritis, unspecified site: Secondary | ICD-10-CM | POA: Diagnosis not present

## 2021-03-08 DIAGNOSIS — Z96651 Presence of right artificial knee joint: Secondary | ICD-10-CM | POA: Diagnosis not present

## 2021-03-11 DIAGNOSIS — M1991 Primary osteoarthritis, unspecified site: Secondary | ICD-10-CM | POA: Diagnosis not present

## 2021-03-11 DIAGNOSIS — M25562 Pain in left knee: Secondary | ICD-10-CM | POA: Diagnosis not present

## 2021-03-11 DIAGNOSIS — Z96651 Presence of right artificial knee joint: Secondary | ICD-10-CM | POA: Diagnosis not present

## 2021-03-11 DIAGNOSIS — M25662 Stiffness of left knee, not elsewhere classified: Secondary | ICD-10-CM | POA: Diagnosis not present

## 2021-03-14 DIAGNOSIS — M25662 Stiffness of left knee, not elsewhere classified: Secondary | ICD-10-CM | POA: Diagnosis not present

## 2021-03-14 DIAGNOSIS — Z96651 Presence of right artificial knee joint: Secondary | ICD-10-CM | POA: Diagnosis not present

## 2021-03-14 DIAGNOSIS — M25562 Pain in left knee: Secondary | ICD-10-CM | POA: Diagnosis not present

## 2021-03-14 DIAGNOSIS — M1991 Primary osteoarthritis, unspecified site: Secondary | ICD-10-CM | POA: Diagnosis not present

## 2021-03-15 ENCOUNTER — Ambulatory Visit (INDEPENDENT_AMBULATORY_CARE_PROVIDER_SITE_OTHER): Payer: Medicare HMO | Admitting: Pharmacist

## 2021-03-15 DIAGNOSIS — G894 Chronic pain syndrome: Secondary | ICD-10-CM

## 2021-03-15 DIAGNOSIS — E1169 Type 2 diabetes mellitus with other specified complication: Secondary | ICD-10-CM

## 2021-03-15 DIAGNOSIS — F411 Generalized anxiety disorder: Secondary | ICD-10-CM

## 2021-03-15 DIAGNOSIS — E1140 Type 2 diabetes mellitus with diabetic neuropathy, unspecified: Secondary | ICD-10-CM

## 2021-03-15 DIAGNOSIS — E785 Hyperlipidemia, unspecified: Secondary | ICD-10-CM

## 2021-03-15 DIAGNOSIS — F0632 Mood disorder due to known physiological condition with major depressive-like episode: Secondary | ICD-10-CM

## 2021-03-15 NOTE — Patient Instructions (Signed)
[name],  It was great talking with you today.   Let me know if you have any questions or concerns moving forward.   Visit Information   Following is a copy of your full care plan:  Care Plan : Medication Management  Updates made by De Hollingshead, RPH-CPP since 03/15/2021 12:00 AM     Problem: HTN, CAD, hx DVT, HLD, T2DM      Long-Range Goal: Medication Access   Start Date: 03/15/2021  This Visit's Progress: On track  Priority: High  Note:   Current Barriers:  Unable to independently afford treatment regimen  Pharmacist Clinical Goal(s):  patient will verbalize ability to afford treatment regimen through collaboration with PharmD and provider.    Interventions: 1:1 collaboration with [name], MD regarding development and update of comprehensive plan of care as evidenced by provider attestation and co-signature Inter-disciplinary care team collaboration (see longitudinal plan of care) Comprehensive medication review performed; medication list updated in electronic medical record  Health Maintenance   Yearly diabetic eye exam: up to date Yearly diabetic foot exam: up to date Urine microalbumin: up to date Yearly influenza vaccination: up to date Td/Tdap vaccination: up to date Pneumonia vaccination: due - discuss moving forward COVID vaccinations: due - recommend bivalent booster  Shingrix vaccinations: due - will discuss moving forward Colonoscopy: up to date  Diabetes:  Controlled; current treatment: metformin 500 mg twice daily;  Recommended to continue current regimen at this time  Hypertension, CAD:   Controlled; current treatment: amlodipine 5 mg daily, losartan 100 mg BID, metoprolol tartrate 25 mg BID; furosemide 20 mg PRN - has not needed recently Recommended to continue current regimen at this time  Hyperlipidemia:   Uncontrolled but anticipated to be improved; current treatment: atorvastatin 80 mg daily, ezetimibe 10 mg daily;  If next LDL is  not at goal <70 with addition of ezetimibe, recommend PCSK9i. Patient would qualify for copay assistance for PCSK9i.   Depression/Anxiety/Insomnia, Chronic pain:   Improving; current treatment: aripiprazole 2 mg daily, bupropion XL 300 mg daily, buspirone 15 mg TID, clonazepam 1 mg BID, trazodone 150 mg QPM;  Pain regimen: gabapentin 600 mg TID, hydrocodone/PAP 10/325 mg PRN, methocarbamol 500 mg Q8H Regimen reinitiated since hospitalization. Patient reports he is feeling much better back on his regular regimen. Encouraged continued monitoring for sedation.   DVT Treatment, provoked (s/p TKR): Appropriately managed; current regimen: completing Xarelto 15 mg BID, will transition to Xarelto 20 mg daily on Sunday.  Discussed income. Patient meets income cut off for Xarelto assistance, but has not met out of pocket spend requirement, and is unlikely to do so before the end of the calendar year. He reports a $45 copay for Xarelto at this time.  Recommended to continue current regimen at this time. Discussing other financial support options. Care Guide referral for SDOH needs.   Hypothyroidism: Controlled per last lab work; current regimen: levothyroxine 88 mcg daily Recommended to continue current regimen at this time  GERD: Controlled per patient report; current regimen; omeprazole 40 mg BID, dicyclomine 10 mg QID PRN - patient reports taking BID Recommended to continue current regimen at this time. Unclear diagnosis for dicyclomine use. Consider benefit vs risk moving forward.    Patient Goals/Self-Care Activities patient will:  - take medications as prescribed as evidenced by patient report and record review collaborate with provider on medication access solutions      Consent to CCM Services: [name] was given information about Chronic Care Management services including:  CCM service includes personalized support from designated clinical staff supervised by his physician, including  individualized plan of care and coordination with other care providers 24/7 contact phone numbers for assistance for urgent and routine care needs. Service will only be billed when office clinical staff spend 20 minutes or more in a month to coordinate care. Only one practitioner may furnish and bill the service in a calendar month. The patient may stop CCM services at any time (effective at the end of the month) by phone call to the office staff. The patient will be responsible for cost sharing (co-pay) of up to 20% of the service fee (after annual deductible is met).  Patient agreed to services and verbal consent obtained.   Plan: Telephone follow up appointment with care management team member scheduled for:  3 months  Catie Darnelle Maffucci, PharmD, [place], CPP Clinical Pharmacist [place] at [facility] [phone]   Please call the care guide team at [phone] if you need to cancel or reschedule your appointment.   Patient verbalizes understanding of instructions provided today and agrees to view in [place].

## 2021-03-15 NOTE — Chronic Care Management (AMB) (Signed)
Chronic Care Management CCM Pharmacy Note  03/15/2021 Name:  [name] MRN:  [id number] DOB:  [birth year]  Summary: - Started on Xarelto treatment dose. Copay $45 per month.   Recommendations/Changes made from today's visit: - Over income for medication assistance. Continue current regimen at this time. Care Guide referral to evaluate for other financial support options.   Subjective: [name] is an 55 y.o. year old male who is a primary patient of Tullo, Aris Everts, MD.  The CCM team was consulted for assistance with disease management and care coordination needs.    Engaged with patient by telephone for initial visit for pharmacy case management and/or care coordination services.   Objective:  Medications Reviewed Today     Reviewed by De Hollingshead, RPH-CPP (Pharmacist) on 03/15/21 at [place] List Status: <None>   Medication Order Taking? Sig Documenting Provider Last Dose Status Informant  acetaminophen (TYLENOL) 325 MG tablet [id number] Yes Take 1-2 tablets (325-650 mg total) by mouth every 6 (six) hours as needed for mild pain (pain score 1-3 or temp > 100.5). Meredith Pel, MD Taking Active   amLODipine [facility]) 5 MG tablet [id number] Yes Take 1 tablet by mouth once daily [name], MD Taking Active   ARIPiprazole (ABILIFY) 2 MG tablet [id number] Yes Take 1 tablet (2 mg total) by mouth daily. Kathlee Nations, MD Taking Active   atorvastatin (LIPITOR) 80 MG tablet [id number] Yes Take 80 mg by mouth daily. [provider] Taking Active Self  blood glucose meter kit and supplies [id number]  Dispense based on patient and insurance preference. Use to check blood sugars once daily. [name], MD  Active Self  buPROPion (WELLBUTRIN XL) 300 MG 24 hr tablet [id number] Yes Take 1 tablet (300 mg total) by mouth daily. Kathlee Nations, MD Taking Active   busPIRone (BUSPAR) 15 MG tablet [id number] Yes Take 1 tablet (15 mg total) by mouth 3 (three) times  daily. [name], MD Taking Active   clobetasol (TEMOVATE) 0.05 % external solution [id number]  Apply 1 application topically 2 (two) times daily as needed (scalp irritation). [provider]  Active Self  Clobetasol Propionate 0.05 % shampoo [id number]  Apply to dry scalp and leave on for 15 minutes  Patient taking differently: Apply 1 application topically 2 (two) times a week. Apply to dry scalp and leave on for 15 minutes as needed for itching.   [name], MD  Active Self  clonazePAM (KLONOPIN) 1 MG tablet [id number] Yes TAKE 1 TABLET BY MOUTH TWICE DAILY AS NEEDED FOR ANXIETY [name], MD Taking Active   dicyclomine (BENTYL) 20 MG tablet [id number] Yes TAKE 1 TABLET BY MOUTH EVERY 6 HOURS [name], MD Taking Active   docusate sodium (COLACE) 100 MG capsule [id number] No Take 1 capsule (100 mg total) by mouth 2 (two) times daily.  Patient not taking: Reported on 03/15/2021   Meredith Pel, MD Not Taking Active   ezetimibe (ZETIA) 10 MG tablet [id number] Yes Take 1 tablet (10 mg total) by mouth daily. [name], MD Taking Active Self  furosemide (LASIX) 20 MG tablet [id number] Yes Take 1 tablet (20 mg total) by mouth every other day. [name], MD Taking Active Self  gabapentin (NEURONTIN) 600 MG tablet [id number] Yes TAKE 1 TABLET BY MOUTH THREE TIMES DAILY [name], MD Taking Active Self  glucose blood test strip [id number] Yes Use once daily to check  BS at various times  E11.9 [name], MD Taking Active Self  HYDROcodone-acetaminophen [facility]) 10-325 MG tablet [id number] Yes Take 1 tablet by mouth every 6 (six) hours as needed. [name], MD Taking Active   levothyroxine (SYNTHROID) 88 MCG tablet [id number] Yes TAKE 1 TABLET BY MOUTH ONCE DAILY BEFORE BREAKFAST [name], MD Taking Active Self  losartan (COZAAR) 100 MG tablet [id number] Yes Take 1 tablet (100 mg total) by mouth daily. [name], MD Taking Active Self   metFORMIN (GLUCOPHAGE) 500 MG tablet [id number] Yes Take 1 tablet (500 mg total) by mouth 2 (two) times daily with a meal. [name], MD Taking Active Self  methocarbamol (ROBAXIN) 500 MG tablet [id number] Yes Take 1 tablet (500 mg total) by mouth every 8 (eight) hours as needed. [name], MD Taking Active Self  metoprolol tartrate (LOPRESSOR) 25 MG tablet [id number] Yes Take 1 tablet by mouth twice daily [name], MD Taking Active   Multiple Vitamin (MULTIVITAMIN WITH MINERALS) TABS tablet [id number] Yes Take 1 tablet by mouth daily. [provider] Taking Active Self  omeprazole (PRILOSEC) 40 MG capsule [id number] Yes TAKE 1 CAPSULE BY MOUTH IN THE MORNING AND AT BEDTIME [name], MD Taking Active   promethazine (PHENERGAN) 12.5 MG tablet [id number] Yes Take 1 tablet (12.5 mg total) by mouth every 4 (four) hours as needed for nausea or vomiting.  Patient taking differently: Take 12.5 mg by mouth every 8 (eight) hours as needed for nausea or vomiting.   [name], MD Taking Active Self  RIVAROXABAN [employer]) VTE STARTER PACK (15 & 20 MG) [id number] Yes Follow package directions: Take one 47m tablet by mouth twice a day. On day 22, switch to one 286mtablet once a day. Take with food. Magnant, ChGerrianne ScalePA-C Taking Active   traZODone (DESYREL) 100 MG tablet [id number] TAKE 1 & 1/2 (ONE & ONE-HALF) TABLETS BY MOUTH AT BEDTIME TuCrecencio McMD Taking Active             Pertinent Labs:   Lab Results  Component Value Date   HGBA1C 6.5 (H) 02/07/2021   Lab Results  Component Value Date   CHOL 152 12/16/2020   HDL 32.90 (L) 12/16/2020   LDLCALC 84 05/28/2020   LDLDIRECT 99.0 12/16/2020   TRIG 215.0 (H) 12/16/2020   CHOLHDL 5 12/16/2020   Lab Results  Component Value Date   CREATININE 0.90 02/11/2021   BUN 9 02/11/2021   NA 136 02/11/2021   K 3.7 02/11/2021   CL 99 02/11/2021   CO2 31 02/11/2021    SDOH:  (Social Determinants of Health)  assessments and interventions performed:  SDOH Interventions    Flowsheet Row Most Recent Value  SDOH Interventions   Financial Strain Interventions Other (Comment)  [care guide referral]       CCM Care Plan  Review of patient past medical history, allergies, medications, health status, including review of consultants reports, laboratory and other test data, was performed as part of comprehensive evaluation and provision of chronic care management services.   Care Plan : Medication Management  Updates made by TrDe HollingsheadRPH-CPP since 03/15/2021 12:00 AM     Problem: HTN, CAD, hx DVT, HLD, T2DM      Long-Range Goal: Medication Access   Start Date: 03/15/2021  This Visit's Progress: On track  Priority: High  Note:   Current Barriers:  Unable to independently afford treatment regimen  Pharmacist  Clinical Goal(s):  patient will verbalize ability to afford treatment regimen through collaboration with PharmD and provider.    Interventions: 1:1 collaboration with [name], MD regarding development and update of comprehensive plan of care as evidenced by provider attestation and co-signature Inter-disciplinary care team collaboration (see longitudinal plan of care) Comprehensive medication review performed; medication list updated in electronic medical record  Health Maintenance   Yearly diabetic eye exam: up to date Yearly diabetic foot exam: up to date Urine microalbumin: up to date Yearly influenza vaccination: up to date Td/Tdap vaccination: up to date Pneumonia vaccination: due - discuss moving forward COVID vaccinations: due - recommend bivalent booster  Shingrix vaccinations: due - will discuss moving forward Colonoscopy: up to date  Diabetes:  Controlled; current treatment: metformin 500 mg twice daily;  Recommended to continue current regimen at this time  Hypertension, CAD:   Controlled; current treatment: amlodipine 5 mg daily, losartan 100 mg  BID, metoprolol tartrate 25 mg BID; furosemide 20 mg PRN - has not needed recently Recommended to continue current regimen at this time  Hyperlipidemia:   Uncontrolled but anticipated to be improved; current treatment: atorvastatin 80 mg daily, ezetimibe 10 mg daily;  If next LDL is not at goal <70 with addition of ezetimibe, recommend PCSK9i. Patient would qualify for copay assistance for PCSK9i.   Depression/Anxiety/Insomnia, Chronic pain:   Improving; current treatment: aripiprazole 2 mg daily, bupropion XL 300 mg daily, buspirone 15 mg TID, clonazepam 1 mg BID, trazodone 150 mg QPM;  Pain regimen: gabapentin 600 mg TID, hydrocodone/PAP 10/325 mg PRN, methocarbamol 500 mg Q8H Regimen reinitiated since hospitalization. Patient reports he is feeling much better back on his regular regimen. Encouraged continued monitoring for sedation.   DVT Treatment, provoked (s/p TKR): Appropriately managed; current regimen: completing Xarelto 15 mg BID, will transition to Xarelto 20 mg daily on Sunday.  Discussed income. Patient meets income cut off for Xarelto assistance, but has not met out of pocket spend requirement, and is unlikely to do so before the end of the calendar year. He reports a $45 copay for Xarelto at this time.  Recommended to continue current regimen at this time. Discussing other financial support options. Care Guide referral for SDOH needs.   Hypothyroidism: Controlled per last lab work; current regimen: levothyroxine 88 mcg daily Recommended to continue current regimen at this time  GERD: Controlled per patient report; current regimen; omeprazole 40 mg BID, dicyclomine 10 mg QID PRN - patient reports taking BID Recommended to continue current regimen at this time. Unclear diagnosis for dicyclomine use. Consider benefit vs risk moving forward.    Patient Goals/Self-Care Activities patient will:  - take medications as prescribed as evidenced by patient report and record  review collaborate with provider on medication access solutions       Plan: Telephone follow up appointment with care management team member scheduled for:  3 months  Catie Darnelle Maffucci, PharmD, [place], CPP Clinical Pharmacist [employer] at [facility] [phone]

## 2021-03-16 DIAGNOSIS — M25562 Pain in left knee: Secondary | ICD-10-CM | POA: Diagnosis not present

## 2021-03-16 DIAGNOSIS — M25662 Stiffness of left knee, not elsewhere classified: Secondary | ICD-10-CM | POA: Diagnosis not present

## 2021-03-16 DIAGNOSIS — M1991 Primary osteoarthritis, unspecified site: Secondary | ICD-10-CM | POA: Diagnosis not present

## 2021-03-16 DIAGNOSIS — Z96651 Presence of right artificial knee joint: Secondary | ICD-10-CM | POA: Diagnosis not present

## 2021-03-21 DIAGNOSIS — M25562 Pain in left knee: Secondary | ICD-10-CM | POA: Diagnosis not present

## 2021-03-21 DIAGNOSIS — M25662 Stiffness of left knee, not elsewhere classified: Secondary | ICD-10-CM | POA: Diagnosis not present

## 2021-03-21 DIAGNOSIS — M1991 Primary osteoarthritis, unspecified site: Secondary | ICD-10-CM | POA: Diagnosis not present

## 2021-03-21 DIAGNOSIS — Z96651 Presence of right artificial knee joint: Secondary | ICD-10-CM | POA: Diagnosis not present

## 2021-03-23 DIAGNOSIS — E1169 Type 2 diabetes mellitus with other specified complication: Secondary | ICD-10-CM | POA: Diagnosis not present

## 2021-03-23 DIAGNOSIS — E785 Hyperlipidemia, unspecified: Secondary | ICD-10-CM

## 2021-03-23 DIAGNOSIS — E1140 Type 2 diabetes mellitus with diabetic neuropathy, unspecified: Secondary | ICD-10-CM

## 2021-03-25 ENCOUNTER — Ambulatory Visit (INDEPENDENT_AMBULATORY_CARE_PROVIDER_SITE_OTHER): Payer: Medicare HMO | Admitting: Surgical

## 2021-03-25 ENCOUNTER — Encounter: Payer: Self-pay | Admitting: Surgical

## 2021-03-25 ENCOUNTER — Other Ambulatory Visit: Payer: Self-pay

## 2021-03-25 DIAGNOSIS — Z96652 Presence of left artificial knee joint: Secondary | ICD-10-CM

## 2021-03-25 NOTE — Progress Notes (Signed)
Post-Op Visit Note   Patient: [name]           Date of Birth: [birth year]           MRN: [id number] Visit Date: 03/25/2021 PCP: [name], MD   Assessment & Plan:  Chief Complaint: No chief complaint on file.  Visit Diagnoses:  1. Status post total left knee replacement     Plan: Patient is a 55 year old male who presents s/p left total knee arthroplasty on 02/10/2021.  He states he is progressing well and doing well with physical therapy.  He has been measured at 120 degrees of flexion in physical therapy.  He was diagnosed with DVT after his last visit.  This is being treated by his primary care physician with Xarelto.  Appreciate Dr. Derrel Nip managing this.  He denies any chest pain, shortness of breath.  Feels he has not hit a wall.  Most of his pain is in the anterior aspect of the knee and he notices it with terminal flexion but otherwise he is walking well.  He feels he is able to walk without a cane.  No difficulty with ascending or descending stairs or standing up from a toilet seat.  Only taking pain medication 1-2 times per day as needed for pain.  On examination patient has no effusion.  0 degrees extension and 120 degrees of knee flexion.  No calf tenderness.  Negative Homans' sign.  Able to perform straight leg raise easily.  Excellent quadricep strength rated 5/5.  Patient ambulates with minimal antalgia and no giving out of the operative knee.  Incision is well-healed with no evidence of infection or dehiscence.  Plan is to continue with physical therapy and follow-up with Dr. Marlou Sa for final check in 6 weeks.  No major concerns after visit today and he seems to be doing very well with excellent range of motion.  Follow-Up Instructions: No follow-ups on file.   Orders:  No orders of the defined types were placed in this encounter.  No orders of the defined types were placed in this encounter.   Imaging: No results found.  PMFS History: Patient Active  Problem List   Diagnosis Date Noted   Chronic pain associated with significant psychosocial dysfunction 03/05/2021   DVT, lower extremity, distal, acute, left ([place]) 03/05/2021   Arthritis of left knee    Osteoarthritis 02/10/2021   S/P TKR (total knee replacement), left 02/10/2021   Grief counseling 11/14/2020   Abnormal electrocardiogram (ECG) (EKG) 09/02/2020   Preoperative evaluation to rule out surgical contraindication 09/02/2020   MDD (major depressive disorder), recurrent episode, moderate ([place]) 05/29/2020   Colon cancer screening 02/02/2020   Eczema of both external ears 12/08/2019   1+ pitting edema 12/08/2019   Polyarthritis of multiple sites 10/27/2019   Coronary artery calcification seen on CT scan 10/27/2019   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 ([place]) 08/26/2019   Radiculopathy, cervical region 06/10/2019   Carpal tunnel syndrome, left upper limb 06/10/2019   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   Spinal stenosis of cervical region 12/31/2018   Loose body of left knee    Plantar fasciitis of left foot 08/24/2017   Chronic pain of both hips 06/20/2017   Complete rotator cuff tear of left shoulder 09/14/2015   Hypothyroidism due to acquired atrophy of thyroid 09/14/2015   Arthritis of knee 06/23/2014   Encounter for preventive health examination 06/17/2014   Enlarged RV (right ventricle) 06/17/2014   GERD (gastroesophageal reflux  disease) 04/20/2014   Cervical spine degeneration 02/17/2014   Overweight 10/29/2013   Chronic knee pain 10/29/2013   Type 2 diabetes mellitus with diabetic neuropathy, unspecified ([place]) 10/23/2012   Seborrheic dermatitis, unspecified 05/17/2012   Fatty liver 04/22/2012   Hypogonadism male 03/12/2012   Chronic pain of multiple joints 12/24/2010   Sciatica of left side associated with disorder of lumbar spine 12/24/2010   Hyperlipidemia associated with type 2 diabetes mellitus ([place]) 01/13/2009    Anxiety state 01/13/2009   Essential hypertension 01/13/2009   OSA (obstructive sleep apnea) 01/13/2009   Past Medical History:  Diagnosis Date   Acute medial meniscus tear of left knee    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   Anxiety    Anxiety and depression    Arthritis    knees and shoulders   Benzodiazepine withdrawal with complication ([place]) 25/36/6440   Bruit    L   Chest pain    hx   Cognitive complaints with normal neuropsychological exam 10/23/2016   Diabetes mellitus without complication ([facility])    "borderline", diet controlled, no meds, patient has lost 30 lbs   Edema    Fatty liver    GERD (gastroesophageal reflux disease)    uses Omeprazole   Goiter    HLD (hyperlipidemia)    Hypertension    essential, benign   Hypothyroidism    Impotence of organic origin    Murmur    never has caused any problems   Neuromuscular disorder ([place])    Other chest pain    tightness, pressure   Palpitation    hx   Precordial pain    Sleep apnea    uses cpap    Family History  Problem Relation Age of Onset   Heart disease Father    Colon cancer Neg Hx    Colon polyps Neg Hx    Esophageal cancer Neg Hx    Rectal cancer Neg Hx    Stomach cancer Neg Hx     Past Surgical History:  Procedure Laterality Date   APPENDECTOMY  2002   done at [place] N/A 08/27/2015   Procedure: Left Heart Cath and Coronary Angiography;  Surgeon: Jolaine Artist, MD;  Location: [facility];  Service: Cardiovascular;  Laterality: N/A;   COLONOSCOPY  in his 20's   EAR CYST EXCISION Left 07/08/2019   Procedure: open excision baker's cyst left knee;  Surgeon: Meredith Pel, MD;  Location: [facility];  Service: Orthopedics;  Laterality: Left;   HERNIA REPAIR  3-47-42   umbilical   JOINT REPLACEMENT     right knee   KNEE ARTHROSCOPY Left 12/17/2018   Procedure: left knee arthroscopy, meniscal debridement, loose body  removal;  Surgeon: Meredith Pel, MD;  Location: [facility];  Service: Orthopedics;  Laterality: Left;   REVISION TOTAL KNEE ARTHROPLASTY Right 06/23/2014   DR DEAN   TONSILLECTOMY     TOTAL KNEE ARTHROPLASTY Right 2011   right   TOTAL KNEE ARTHROPLASTY Left 02/10/2021   Procedure: LEFT TOTAL KNEE ARTHROPLASTY;  Surgeon: Meredith Pel, MD;  Location: [facility];  Service: Orthopedics;  Laterality: Left;   TOTAL KNEE REVISION Right 06/23/2014   Procedure: TOTAL KNEE REVISION;  Surgeon: Meredith Pel, MD;  Location: [facility];  Service: Orthopedics;  Laterality: Right;   UPPER GASTROINTESTINAL ENDOSCOPY  04/30/2013   Judieth Keens TOOTH EXTRACTION     Social History  Occupational History   Occupation: Personnel officer: Public relations account executive   Occupation: SERVICE DIRECTOR    Employer: [employer]  Tobacco Use   Smoking status: Former    Packs/day: 2.00    Years: 18.00    Pack years: 36.00    Types: Cigarettes    Quit date: 04/24/2005    Years since quitting: 15.9   Smokeless tobacco: Never   Tobacco comments:    quit 20 years ago   Vaping Use   Vaping Use: Never used  Substance and Sexual Activity   Alcohol use: Yes    Alcohol/week: 4.0 standard drinks    Types: 4 Cans of beer per week   Drug use: No   Sexual activity: Yes

## 2021-03-28 DIAGNOSIS — M1991 Primary osteoarthritis, unspecified site: Secondary | ICD-10-CM | POA: Diagnosis not present

## 2021-03-28 DIAGNOSIS — M25562 Pain in left knee: Secondary | ICD-10-CM | POA: Diagnosis not present

## 2021-03-28 DIAGNOSIS — M25662 Stiffness of left knee, not elsewhere classified: Secondary | ICD-10-CM | POA: Diagnosis not present

## 2021-03-28 DIAGNOSIS — Z96651 Presence of right artificial knee joint: Secondary | ICD-10-CM | POA: Diagnosis not present

## 2021-03-31 ENCOUNTER — Telehealth: Payer: Self-pay | Admitting: Orthopedic Surgery

## 2021-03-31 ENCOUNTER — Other Ambulatory Visit: Payer: Self-pay | Admitting: Internal Medicine

## 2021-03-31 DIAGNOSIS — F331 Major depressive disorder, recurrent, moderate: Secondary | ICD-10-CM

## 2021-03-31 DIAGNOSIS — M25562 Pain in left knee: Secondary | ICD-10-CM | POA: Diagnosis not present

## 2021-03-31 DIAGNOSIS — Z96651 Presence of right artificial knee joint: Secondary | ICD-10-CM | POA: Diagnosis not present

## 2021-03-31 DIAGNOSIS — M1991 Primary osteoarthritis, unspecified site: Secondary | ICD-10-CM | POA: Diagnosis not present

## 2021-03-31 DIAGNOSIS — M25662 Stiffness of left knee, not elsewhere classified: Secondary | ICD-10-CM | POA: Diagnosis not present

## 2021-03-31 NOTE — Telephone Encounter (Signed)
Patient called in to make sure medication goes to [facility] on [address], [place], [place] [address]  [phone]   Please call patient @ [phone]

## 2021-03-31 NOTE — Telephone Encounter (Signed)
Patient called advised he now uses [facility] in [place]  on [address]. Patient said he is out of Xarelto.  Patient asked for a call when the Rx is sent to the pharmacy. The number to contact patient  if needed is [phone]

## 2021-03-31 NOTE — Telephone Encounter (Signed)
IC no answer. LMVM advising per Lurena Joiner should contact his PCP since he is likely only different dose than the initial starter pack and they should be managing his DVT at this point.

## 2021-04-01 NOTE — Addendum Note (Signed)
Addended by: Adair Laundry on: 04/01/2021 03:55 PM   Modules accepted: Orders

## 2021-04-01 NOTE — Telephone Encounter (Signed)
Pt returned call and stated that the medication he is requesting is xarelto that was given to him after having surgery.

## 2021-04-01 NOTE — Telephone Encounter (Signed)
LMTCB. Need to find out medications need to be refilled.     Pharmacy has ben changed in chart.

## 2021-04-01 NOTE — Telephone Encounter (Signed)
Pt returned call and stated that the medication he is requesting is xarelto that was given to him after having surgery. Pt also stated that he needs the Abilify resent to [facility] on [address] (medication was filled by a different provider).

## 2021-04-01 NOTE — Telephone Encounter (Signed)
Patient returning calling, requesting call back  about refill, Patient unable to spell medication not seeing it on med list

## 2021-04-02 ENCOUNTER — Other Ambulatory Visit: Payer: Self-pay | Admitting: Internal Medicine

## 2021-04-02 MED ORDER — RIVAROXABAN 20 MG PO TABS: 20.0000 mg | ORAL_TABLET | Freq: Every day | ORAL | 1 refills | Status: DC

## 2021-04-02 MED ORDER — ARIPIPRAZOLE 2 MG PO TABS: 2.0000 mg | ORAL_TABLET | Freq: Every day | ORAL | 2 refills | Status: DC

## 2021-04-05 DIAGNOSIS — Z96651 Presence of right artificial knee joint: Secondary | ICD-10-CM | POA: Diagnosis not present

## 2021-04-05 DIAGNOSIS — M25662 Stiffness of left knee, not elsewhere classified: Secondary | ICD-10-CM | POA: Diagnosis not present

## 2021-04-05 DIAGNOSIS — M1991 Primary osteoarthritis, unspecified site: Secondary | ICD-10-CM | POA: Diagnosis not present

## 2021-04-05 DIAGNOSIS — M25562 Pain in left knee: Secondary | ICD-10-CM | POA: Diagnosis not present

## 2021-04-07 DIAGNOSIS — Z96651 Presence of right artificial knee joint: Secondary | ICD-10-CM | POA: Diagnosis not present

## 2021-04-07 DIAGNOSIS — M25562 Pain in left knee: Secondary | ICD-10-CM | POA: Diagnosis not present

## 2021-04-07 DIAGNOSIS — M1991 Primary osteoarthritis, unspecified site: Secondary | ICD-10-CM | POA: Diagnosis not present

## 2021-04-07 DIAGNOSIS — M25662 Stiffness of left knee, not elsewhere classified: Secondary | ICD-10-CM | POA: Diagnosis not present

## 2021-04-08 ENCOUNTER — Telehealth: Payer: Self-pay | Admitting: *Deleted

## 2021-04-08 NOTE — Telephone Encounter (Signed)
° °  Telephone encounter was:  Successful.  04/08/2021 Name: AROLDO GALLI MRN: [id number] DOB: 02/20/1966  [name] is a 55 y.o. year old male who is a primary care patient of Derrel Nip, Aris Everts, MD . The community resource team was consulted for assistance with patient has no needs at this time said his resources have been resolved and would reach out if  his needs changed   Care guide performed the following interventions: Follow up call placed to community resources to determine status of patients referral.  Follow Up Plan:  No further follow up planned at this time. The patient has been provided with needed resources.  [place], Care Management  [phone] [address] , [place] [address] Email : [email or web address]  .

## 2021-04-11 DIAGNOSIS — M25562 Pain in left knee: Secondary | ICD-10-CM | POA: Diagnosis not present

## 2021-04-11 DIAGNOSIS — M25662 Stiffness of left knee, not elsewhere classified: Secondary | ICD-10-CM | POA: Diagnosis not present

## 2021-04-11 DIAGNOSIS — Z96651 Presence of right artificial knee joint: Secondary | ICD-10-CM | POA: Diagnosis not present

## 2021-04-11 DIAGNOSIS — M1991 Primary osteoarthritis, unspecified site: Secondary | ICD-10-CM | POA: Diagnosis not present

## 2021-04-13 DIAGNOSIS — M25662 Stiffness of left knee, not elsewhere classified: Secondary | ICD-10-CM | POA: Diagnosis not present

## 2021-04-13 DIAGNOSIS — M25562 Pain in left knee: Secondary | ICD-10-CM | POA: Diagnosis not present

## 2021-04-13 DIAGNOSIS — Z96651 Presence of right artificial knee joint: Secondary | ICD-10-CM | POA: Diagnosis not present

## 2021-04-13 DIAGNOSIS — M1991 Primary osteoarthritis, unspecified site: Secondary | ICD-10-CM | POA: Diagnosis not present

## 2021-04-24 ENCOUNTER — Encounter: Payer: Self-pay | Admitting: Internal Medicine

## 2021-04-26 MED ORDER — METHOCARBAMOL 500 MG PO TABS: 500.0000 mg | ORAL_TABLET | Freq: Three times a day (TID) | ORAL | 2 refills | Status: DC | PRN

## 2021-04-26 NOTE — Telephone Encounter (Signed)
Refilled: 03/04/2021 Last OV: 03/04/2021 Next OV: 06/06/2021  Pharmacy has been changed in chart.

## 2021-04-27 MED ORDER — HYDROCODONE-ACETAMINOPHEN 10-325 MG PO TABS: 1.0000 | ORAL_TABLET | Freq: Two times a day (BID) | ORAL | 0 refills | Status: DC | PRN

## 2021-05-06 ENCOUNTER — Other Ambulatory Visit: Payer: Self-pay

## 2021-05-06 ENCOUNTER — Ambulatory Visit (INDEPENDENT_AMBULATORY_CARE_PROVIDER_SITE_OTHER): Payer: Medicare HMO | Admitting: Orthopedic Surgery

## 2021-05-06 DIAGNOSIS — Z96652 Presence of left artificial knee joint: Secondary | ICD-10-CM

## 2021-05-07 ENCOUNTER — Encounter: Payer: Self-pay | Admitting: Orthopedic Surgery

## 2021-05-07 NOTE — Progress Notes (Signed)
Post-Op Visit Note   Patient: [name]           Date of Birth: 01-02-1966           MRN: [id number] Visit Date: 05/06/2021 PCP: [name], MD   Assessment & Plan:  Chief Complaint:  Chief Complaint  Patient presents with   Left Knee - Follow-up    left total knee arthroplasty on 02/10/2021   Visit Diagnoses:  1. Status post total left knee replacement     Plan: [name] is a 56 year old patient with left total knee replacement done 02/10/2021.  He is doing well.  Has some occasional stiffness after long standing or sitting.  He is out of physical therapy.  He is on disability.  Staying with his mother and helping her out.  Recently his wife passed away after a long illness.  On examination he has full extension and alignment and flexion to about 110.  No effusion.  Gait is normal.  Plan at this time is to continue with nonweightbearing quad strengthening exercises.  All in all [name] has done a very good job of getting his left leg in shape.  Follow-up as needed  Follow-Up Instructions: Return if symptoms worsen or fail to improve.   Orders:  No orders of the defined types were placed in this encounter.  No orders of the defined types were placed in this encounter.   Imaging: No results found.  PMFS History: Patient Active Problem List   Diagnosis Date Noted   Chronic pain associated with significant psychosocial dysfunction 03/05/2021   DVT, lower extremity, distal, acute, left ([place]) 03/05/2021   Arthritis of left knee    Osteoarthritis 02/10/2021   S/P TKR (total knee replacement), left 02/10/2021   Grief counseling 11/14/2020   Abnormal electrocardiogram (ECG) (EKG) 09/02/2020   Preoperative evaluation to rule out surgical contraindication 09/02/2020   MDD (major depressive disorder), recurrent episode, moderate ([place]) 05/29/2020   Colon cancer screening 02/02/2020   Eczema of both external ears 12/08/2019   1+ pitting edema 12/08/2019   Polyarthritis of  multiple sites 10/27/2019   Coronary artery calcification seen on CT scan 10/27/2019   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 ([place]) 08/26/2019   Radiculopathy, cervical region 06/10/2019   Carpal tunnel syndrome, left upper limb 06/10/2019   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   Spinal stenosis of cervical region 12/31/2018   Loose body of left knee    Plantar fasciitis of left foot 08/24/2017   Chronic pain of both hips 06/20/2017   Complete rotator cuff tear of left shoulder 09/14/2015   Hypothyroidism due to acquired atrophy of thyroid 09/14/2015   Arthritis of knee 06/23/2014   Encounter for preventive health examination 06/17/2014   Enlarged RV (right ventricle) 06/17/2014   GERD (gastroesophageal reflux disease) 04/20/2014   Cervical spine degeneration 02/17/2014   Overweight 10/29/2013   Chronic knee pain 10/29/2013   Type 2 diabetes mellitus with diabetic neuropathy, unspecified ([place]) 10/23/2012   Seborrheic dermatitis, unspecified 05/17/2012   Fatty liver 04/22/2012   Hypogonadism male 03/12/2012   Chronic pain of multiple joints 12/24/2010   Sciatica of left side associated with disorder of lumbar spine 12/24/2010   Hyperlipidemia associated with type 2 diabetes mellitus ([place]) 01/13/2009   Anxiety state 01/13/2009   Essential hypertension 01/13/2009   OSA (obstructive sleep apnea) 01/13/2009   Past Medical History:  Diagnosis Date   Acute medial meniscus tear of left knee    Alcohol-induced chronic  pancreatitis ([place]) 11/20/2016   Anxiety    Anxiety and depression    Arthritis    knees and shoulders   Benzodiazepine withdrawal with complication ([place]) 01/14/3006   Bruit    L   Chest pain    hx   Cognitive complaints with normal neuropsychological exam 10/23/2016   Diabetes mellitus without complication (HCC)    "borderline", diet controlled, no meds, patient has lost 30 lbs   Edema    Fatty liver    GERD (gastroesophageal  reflux disease)    uses Omeprazole   Goiter    HLD (hyperlipidemia)    Hypertension    essential, benign   Hypothyroidism    Impotence of organic origin    Murmur    never has caused any problems   Neuromuscular disorder ([place])    Other chest pain    tightness, pressure   Palpitation    hx   Precordial pain    Sleep apnea    uses cpap    Family History  Problem Relation Age of Onset   Heart disease Father    Colon cancer Neg Hx    Colon polyps Neg Hx    Esophageal cancer Neg Hx    Rectal cancer Neg Hx    Stomach cancer Neg Hx     Past Surgical History:  Procedure Laterality Date   APPENDECTOMY  2002   done at [facility] N/A 08/27/2015   Procedure: Left Heart Cath and Coronary Angiography;  Surgeon: Jolaine Artist, MD;  Location: [facility];  Service: Cardiovascular;  Laterality: N/A;   COLONOSCOPY  in his 20's   EAR CYST EXCISION Left 07/08/2019   Procedure: open excision baker's cyst left knee;  Surgeon: Meredith Pel, MD;  Location: [facility];  Service: Orthopedics;  Laterality: Left;   HERNIA REPAIR  10-13-61   umbilical   JOINT REPLACEMENT     right knee   KNEE ARTHROSCOPY Left 12/17/2018   Procedure: left knee arthroscopy, meniscal debridement, loose body removal;  Surgeon: Meredith Pel, MD;  Location: [facility];  Service: Orthopedics;  Laterality: Left;   REVISION TOTAL KNEE ARTHROPLASTY Right 06/23/2014   DR Ranon Coven   TONSILLECTOMY     TOTAL KNEE ARTHROPLASTY Right 2011   right   TOTAL KNEE ARTHROPLASTY Left 02/10/2021   Procedure: LEFT TOTAL KNEE ARTHROPLASTY;  Surgeon: Meredith Pel, MD;  Location: [facility];  Service: Orthopedics;  Laterality: Left;   TOTAL KNEE REVISION Right 06/23/2014   Procedure: TOTAL KNEE REVISION;  Surgeon: Meredith Pel, MD;  Location: [facility];  Service: Orthopedics;  Laterality: Right;   UPPER GASTROINTESTINAL ENDOSCOPY  04/30/2013   Judieth Keens TOOTH  EXTRACTION     Social History   Occupational History   Occupation: Personnel officer: Public relations account executive   Occupation: SERVICE DIRECTOR    Employer: [employer]  Tobacco Use   Smoking status: Former    Packs/day: 2.00    Years: 18.00    Pack years: 36.00    Types: Cigarettes    Quit date: 04/24/2005    Years since quitting: 16.0   Smokeless tobacco: Never   Tobacco comments:    quit 20 years ago   Vaping Use   Vaping Use: Never used  Substance and Sexual Activity   Alcohol use: Yes    Alcohol/week: 4.0 standard drinks    Types: 4  Cans of beer per week   Drug use: No   Sexual activity: Yes

## 2021-05-11 ENCOUNTER — Other Ambulatory Visit: Payer: Self-pay

## 2021-05-11 MED ORDER — DICYCLOMINE HCL 20 MG PO TABS: 20.0000 mg | ORAL_TABLET | Freq: Four times a day (QID) | ORAL | 1 refills | Status: DC

## 2021-05-11 MED ORDER — LEVOTHYROXINE SODIUM 88 MCG PO TABS: 88.0000 ug | ORAL_TABLET | Freq: Every day | ORAL | 1 refills | Status: DC

## 2021-06-02 ENCOUNTER — Other Ambulatory Visit: Payer: Self-pay

## 2021-06-02 ENCOUNTER — Telehealth: Payer: Self-pay | Admitting: Internal Medicine

## 2021-06-02 ENCOUNTER — Encounter (HOSPITAL_COMMUNITY): Payer: Self-pay | Admitting: Psychiatry

## 2021-06-02 ENCOUNTER — Telehealth (HOSPITAL_BASED_OUTPATIENT_CLINIC_OR_DEPARTMENT_OTHER): Payer: Medicare HMO | Admitting: Psychiatry

## 2021-06-02 DIAGNOSIS — F419 Anxiety disorder, unspecified: Secondary | ICD-10-CM | POA: Diagnosis not present

## 2021-06-02 DIAGNOSIS — F331 Major depressive disorder, recurrent, moderate: Secondary | ICD-10-CM | POA: Diagnosis not present

## 2021-06-02 MED ORDER — AMLODIPINE BESYLATE 5 MG PO TABS: 5.0000 mg | ORAL_TABLET | Freq: Every day | ORAL | 1 refills | Status: DC

## 2021-06-02 MED ORDER — ARIPIPRAZOLE 2 MG PO TABS: 2.0000 mg | ORAL_TABLET | Freq: Every day | ORAL | 0 refills | Status: DC

## 2021-06-02 MED ORDER — BUPROPION HCL ER (XL) 300 MG PO TB24: 300.0000 mg | ORAL_TABLET | Freq: Every day | ORAL | 0 refills | Status: DC

## 2021-06-02 MED ORDER — GABAPENTIN 600 MG PO TABS: 600.0000 mg | ORAL_TABLET | Freq: Three times a day (TID) | ORAL | 1 refills | Status: DC

## 2021-06-02 NOTE — Addendum Note (Signed)
Addended by: Adair Laundry on: 06/02/2021 12:28 PM   Modules accepted: Orders

## 2021-06-02 NOTE — Telephone Encounter (Signed)
Medications have been refilled ?

## 2021-06-02 NOTE — Telephone Encounter (Signed)
Patient is requesting refill on his amLODipine (NORVASC) 5 MG tablet and gabapentin (NEURONTIN) 600 MG tablet . Please send to [facility] on [address].

## 2021-06-02 NOTE — Progress Notes (Signed)
Virtual Visit via Telephone Note  I connected with [name] on 06/02/21 at 10:00 AM EST by telephone and verified that I am speaking with the correct person using two identifiers.  Location: Patient: Home Provider: Home Office   I discussed the limitations, risks, security and privacy concerns of performing an evaluation and management service by telephone and the availability of in person appointments. I also discussed with the patient that there may be a patient responsible charge related to this service. The patient expressed understanding and agreed to proceed.   History of Present Illness: Patient is evaluated by phone session.  He is settling in to his new place.  He is excited because his mobile home will put in today.  He reported holidays were difficult because he was missing his wife who died in 07-Nov-2022.  However he had a good family support and all his family lives nearby.  He is sleeping 6 hours.  He is back on Abilify which was discontinued in October and he noticed his depression is much better.  He denies any crying spells or any feeling of hopelessness or worthlessness.  He denies any panic attack.  He has a visit with his PCP Dr. Kalman Jewels who is prescribing Klonopin, BuSpar, gabapentin and trazodone.  Patient reported his knee pain is also better but he is not doing anymore physical therapy.  He is sleeping at least 6 hours.  He denies any anhedonia.  His appetite is okay and his weight is unchanged from the past.  He weighed 222 pounds.  He has no tremor or shakes or any EPS.  Denies any mania or any psychosis.  He had stopped grief counseling through hospice because he felt he was doing better but realized if needed that he can go back to get help.  Past Psychiatric History: Reviewed. H/O depression and anxiety.  No h/o suicidal attempt or inpatient treatment.  Seen Dr. Jake Michaelis and given Zoloft, Klonopin and Cymbalta.  Later PCP added BuSpar and trazodone.  We tried Lamictal but  did not help.   Psychiatric Specialty Exam: Physical Exam  Review of Systems  Weight 222 lb (100.7 kg).There is no height or weight on file to calculate BMI.  General Appearance: NA  Eye Contact:  NA  Speech:  Slow  Volume:  Decreased  Mood:  Dysphoric  Affect:  NA  Thought Process:  Goal Directed  Orientation:  Full (Time, Place, and Person)  Thought Content:  WDL  Suicidal Thoughts:  No  Homicidal Thoughts:  No  Memory:  Immediate;   Good Recent;   Good Remote;   Fair  Judgement:  Intact  Insight:  Present  Psychomotor Activity:  NA  Concentration:  Concentration: Fair and Attention Span: Fair  Recall:  Good  Fund of Knowledge:  Good  Language:  Good  Akathisia:  No  Handed:  Right  AIMS (if indicated):     Assets:  Communication Skills Desire for Improvement Housing Social Support Transportation  ADL's:  Intact  Cognition:  WNL  Sleep:   6 hrs      Assessment and Plan: Major depressive disorder, recurrent.  Anxiety.  Patient doing better since Abilify started.  He is taking 2 mg and noticed improvement in his energy, mood, motivation.  Patient is on disability.  He like to keep his current medication.  We will continue Abilify 2 mg daily and Wellbutrin XL 300 mg daily.  He is getting buspirone, Klonopin, trazodone, gabapentin from his PCP Dr.  Tullio.  He had cut down his pain medicine and taking only 2-3 times a week.  Discussed medication benefits and side effects.  Discussed polypharmacy and in the future will consider and recommend try to cut down his medication.  We will follow up in 3 months.  Follow Up Instructions:    I discussed the assessment and treatment plan with the patient. The patient was provided an opportunity to ask questions and all were answered. The patient agreed with the plan and demonstrated an understanding of the instructions.   The patient was advised to call back or seek an in-person evaluation if the symptoms worsen or if the  condition fails to improve as anticipated.  I provided 22 minutes of non-face-to-face time during this encounter.   Kathlee Nations, MD

## 2021-06-06 ENCOUNTER — Ambulatory Visit (INDEPENDENT_AMBULATORY_CARE_PROVIDER_SITE_OTHER): Payer: Medicare HMO | Admitting: Internal Medicine

## 2021-06-06 ENCOUNTER — Other Ambulatory Visit: Payer: Self-pay

## 2021-06-06 ENCOUNTER — Encounter: Payer: Self-pay | Admitting: Internal Medicine

## 2021-06-06 VITALS — BP 150/92 | HR 73 | Temp 98.2°F | Ht 71.0 in | Wt 227.0 lb

## 2021-06-06 DIAGNOSIS — F331 Major depressive disorder, recurrent, moderate: Secondary | ICD-10-CM | POA: Diagnosis not present

## 2021-06-06 DIAGNOSIS — F411 Generalized anxiety disorder: Secondary | ICD-10-CM

## 2021-06-06 DIAGNOSIS — E785 Hyperlipidemia, unspecified: Secondary | ICD-10-CM | POA: Diagnosis not present

## 2021-06-06 DIAGNOSIS — E1140 Type 2 diabetes mellitus with diabetic neuropathy, unspecified: Secondary | ICD-10-CM | POA: Diagnosis not present

## 2021-06-06 DIAGNOSIS — G894 Chronic pain syndrome: Secondary | ICD-10-CM

## 2021-06-06 DIAGNOSIS — E034 Atrophy of thyroid (acquired): Secondary | ICD-10-CM | POA: Diagnosis not present

## 2021-06-06 DIAGNOSIS — I10 Essential (primary) hypertension: Secondary | ICD-10-CM | POA: Diagnosis not present

## 2021-06-06 DIAGNOSIS — E1169 Type 2 diabetes mellitus with other specified complication: Secondary | ICD-10-CM | POA: Diagnosis not present

## 2021-06-06 LAB — MICROALBUMIN / CREATININE URINE RATIO
Creatinine,U: 92.1 mg/dL
Microalb Creat Ratio: 4.6 mg/g (ref 0.0–30.0)
Microalb, Ur: 4.2 mg/dL — ABNORMAL HIGH (ref 0.0–1.9)

## 2021-06-06 LAB — HEMOGLOBIN A1C: Hgb A1c MFr Bld: 6.7 % — ABNORMAL HIGH (ref 4.6–6.5)

## 2021-06-06 LAB — TSH: TSH: 4.79 u[IU]/mL (ref 0.35–5.50)

## 2021-06-06 MED ORDER — HYDROCODONE-ACETAMINOPHEN 10-325 MG PO TABS: 1.0000 | ORAL_TABLET | Freq: Two times a day (BID) | ORAL | 0 refills | Status: DC | PRN

## 2021-06-06 MED ORDER — CLONAZEPAM 1 MG PO TABS: ORAL_TABLET | ORAL | 2 refills | Status: DC

## 2021-06-06 MED ORDER — TRAZODONE HCL 100 MG PO TABS: ORAL_TABLET | ORAL | 2 refills | Status: DC

## 2021-06-06 NOTE — Patient Instructions (Addendum)
Ok to add a 3rd dose of gabapentin 600 mg  daily ,  or use 1/2 tablet every 4 to 6 hours instead   Max dose of gabapentin is 2400 mg daily  so we have to go up again if needed   I have refilled the trazodone,  and the  hydrocodone and clonazepam for 3 months

## 2021-06-06 NOTE — Progress Notes (Signed)
Subjective:  Patient ID: [name], male    DOB: [birth year]  Age: 56 y.o. MRN: [id number]  CC: The primary encounter diagnosis was Essential hypertension. Diagnoses of Hyperlipidemia associated with type 2 diabetes mellitus ([place]), Type 2 diabetes mellitus with diabetic neuropathy, without long-term current use of insulin ([place]), Hypothyroidism due to acquired atrophy of thyroid, MDD (major depressive disorder), recurrent episode, moderate ([place]), GAD (generalized anxiety disorder), and Chronic pain associated with significant psychosocial dysfunction were also pertinent to this visit.   This visit occurred during the SARS-CoV-2 public health emergency.  Safety protocols were in place, including screening questions prior to the visit, additional usage of staff PPE, and extensive cleaning of exam room while observing appropriate contact time as indicated for disinfecting solutions.    HPI [name] presents for  follow up  Chief Complaint  Patient presents with   Follow-up    1mof/u for medication refill and DM   1) Diabetes  with new onset peripheral neuropathy.  For the last several months he has been having Numbness and tingling  of both feet.  The discomfort is temporarily relieved with gabpentin  but is preventing him from standing more than 25 minutes and occasionally  waking him up  .  Using gabapentin 600 mg  bid    2) GAD:  complicated by grief following the loss of his beloved wife  several months ago.  Feeling better but feels guilty for thinking about moving on.  [facility]  Seeing psychiatry and grief counselling/ . Using less clonazepam 1-2 daily .  last refill jan 9 .  Using trazodone for insomnia.   3) Chronic pain: managed with 1-2 hydrocodone daily.  NSAIDS C/I due to use of anticoagulant .  Refill history confirmed via [place] Controlled Substance databas, accessed by me today..  last refill jan 5.    4) HTN : Patient is  taking his medications as prescribed and notes no adverse effects.  Home BP readings have not been done ,  but all recent OV readings are  < 130/80 .  He is avoiding added salt in his diet     Outpatient Medications Prior to Visit  Medication Sig Dispense Refill   Clobetasol Propionate 0.05 % shampoo Apply to dry scalp and leave on for 15 minutes (Patient taking differently: Apply 1 application topically 2 (two) times a week. Apply to dry scalp and leave on for 15 minutes as needed for itching.) 118 mL 5   acetaminophen (TYLENOL) 325 MG tablet Take 1-2 tablets (325-650 mg total) by mouth every 6 (six) hours as needed for mild pain (pain score 1-3 or temp > 100.5). 30 tablet 0   amLODipine (NORVASC) 5 MG tablet Take 1 tablet (5 mg total) by mouth daily. 90 tablet 1   ARIPiprazole (ABILIFY) 2 MG tablet Take 1 tablet (2 mg total) by mouth daily. 90 tablet 0   atorvastatin (LIPITOR) 80 MG tablet Take 80 mg by mouth daily.     blood glucose meter kit and supplies Dispense based on patient and insurance preference. Use to check blood sugars once daily. 1 each 0   buPROPion (WELLBUTRIN XL) 300 MG 24 hr tablet Take 1 tablet (300 mg total) by mouth daily. 90 tablet 0   busPIRone (BUSPAR) 15 MG tablet Take 1 tablet (15 mg total) by mouth 3 (three) times daily. 90 tablet 5   clobetasol (TEMOVATE) 0.05 % external solution  Apply 1 application topically 2 (two) times daily as needed (scalp irritation). (Patient not taking: Reported on 06/06/2021)     dicyclomine (BENTYL) 20 MG tablet Take 1 tablet (20 mg total) by mouth every 6 (six) hours. 120 tablet 1   docusate sodium (COLACE) 100 MG capsule Take 1 capsule (100 mg total) by mouth 2 (two) times daily. (Patient not taking: Reported on 03/15/2021) 10 capsule 0   ezetimibe (ZETIA) 10 MG tablet Take 1 tablet (10 mg total) by mouth daily. 90 tablet 3   furosemide (LASIX) 20 MG tablet Take 1 tablet (20 mg total) by mouth every other day. 45 tablet 3   gabapentin  (NEURONTIN) 600 MG tablet Take 1 tablet (600 mg total) by mouth 3 (three) times daily. 270 tablet 1   glucose blood test strip Use once daily to check BS at various times  E11.9 100 each 12   levothyroxine (SYNTHROID) 88 MCG tablet Take 1 tablet (88 mcg total) by mouth daily before breakfast. 90 tablet 1   losartan (COZAAR) 100 MG tablet Take 1 tablet (100 mg total) by mouth daily. 90 tablet 3   metFORMIN (GLUCOPHAGE) 500 MG tablet Take 1 tablet (500 mg total) by mouth 2 (two) times daily with a meal. 180 tablet 3   methocarbamol (ROBAXIN) 500 MG tablet Take 1 tablet (500 mg total) by mouth every 8 (eight) hours as needed. 90 tablet 2   metoprolol tartrate (LOPRESSOR) 25 MG tablet Take 1 tablet by mouth twice daily 180 tablet 0   Multiple Vitamin (MULTIVITAMIN WITH MINERALS) TABS tablet Take 1 tablet by mouth daily.     omeprazole (PRILOSEC) 40 MG capsule TAKE 1 CAPSULE BY MOUTH IN THE MORNING AND AT BEDTIME 180 capsule 0   promethazine (PHENERGAN) 12.5 MG tablet Take 1 tablet (12.5 mg total) by mouth every 4 (four) hours as needed for nausea or vomiting. (Patient taking differently: Take 12.5 mg by mouth every 8 (eight) hours as needed for nausea or vomiting.) 20 tablet 0   rivaroxaban (XARELTO) 20 MG TABS tablet Take 1 tablet (20 mg total) by mouth daily with supper. 30 tablet 1   clonazePAM (KLONOPIN) 1 MG tablet TAKE 1 TABLET BY MOUTH TWICE DAILY AS NEEDED FOR ANXIETY 60 tablet 2   HYDROcodone-acetaminophen (NORCO) 10-325 MG tablet Take 1 tablet by mouth 2 (two) times daily as needed. 60 tablet 0   traZODone (DESYREL) 100 MG tablet TAKE 1 & 1/2 (ONE & ONE-HALF) TABLETS BY MOUTH AT BEDTIME 135 tablet 0   No facility-administered medications prior to visit.    Review of Systems;  Patient denies headache, fevers, malaise, unintentional weight loss, skin rash, eye pain, sinus congestion and sinus pain, sore throat, dysphagia,  hemoptysis , cough, dyspnea, wheezing, chest pain, palpitations,  orthopnea, edema, abdominal pain, nausea, melena, diarrhea, constipation, flank pain, dysuria, hematuria, urinary  Frequency, nocturia, seizures,  Focal weakness, Loss of consciousness,  Tremor, insomnia, depression, anxiety, and suicidal ideation.      Objective:  BP (!) 150/92 (BP Location: Left Arm, Patient Position: Sitting, Cuff Size: Large)    Pulse 73    Temp 98.2 F (36.8 C) (Oral)    Ht _0  (1.803 m)    Wt 227 lb (103 kg)    SpO2 98%    BMI 31.66 kg/m   BP Readings from Last 3 Encounters:  06/06/21 (!) 150/92  03/04/21 122/74  02/12/21 106/63    Wt Readings from Last 3 Encounters:  06/06/21 227 lb (103  kg)  03/04/21 218 lb 3.2 oz (99 kg)  02/10/21 231 lb 8 oz (105 kg)    General appearance: alert, cooperative and appears older than stated age Ears: normal TM's and external ear canals both ears Throat: lips, mucosa, and tongue normal; teeth and gums normal Neck: no adenopathy, no carotid bruit, supple, symmetrical, trachea midline and thyroid not enlarged, symmetric, no tenderness/mass/nodules Back: symmetric, no curvature. ROM normal. No CVA tenderness. Lungs: clear to auscultation bilaterally Heart: regular rate and rhythm, S1, S2 normal, no murmur, click, rub or gallop Abdomen: soft, non-tender; bowel sounds normal; no masses,  no organomegaly Pulses: 2+ and symmetric Skin: Skin color, texture, turgor normal. No rashes or lesions Lymph nodes: Cervical, supraclavicular, and axillary nodes normal. Psych: affect sad/grieving , makes good eye contact. No fidgeting,  Denies suicidal thoughts    Lab Results  Component Value Date   HGBA1C 6.5 (H) 02/07/2021   HGBA1C 6.6 (H) 12/16/2020   HGBA1C 6.9 (H) 09/02/2020    Lab Results  Component Value Date   CREATININE 0.90 02/11/2021   CREATININE 0.92 02/07/2021   CREATININE 0.98 12/16/2020    Lab Results  Component Value Date   WBC 10.9 (H) 02/12/2021   HGB 11.9 (L) 02/12/2021   HCT 34.4 (L) 02/12/2021   PLT 189  02/12/2021   GLUCOSE 147 (H) 02/11/2021   CHOL 152 12/16/2020   TRIG 215.0 (H) 12/16/2020   HDL 32.90 (L) 12/16/2020   LDLDIRECT 99.0 12/16/2020   LDLCALC 84 05/28/2020   ALT 23 12/16/2020   AST 20 12/16/2020   NA 136 02/11/2021   K 3.7 02/11/2021   CL 99 02/11/2021   CREATININE 0.90 02/11/2021   BUN 9 02/11/2021   CO2 31 02/11/2021   TSH 2.41 05/28/2020   PSA 0.70 10/16/2017   INR 0.98 08/27/2015   HGBA1C 6.5 (H) 02/07/2021   MICROALBUR <0.7 05/28/2020    XR Knee 1-2 Views Left  Result Date: 02/24/2021 AP and lateral views of left knee reviewed.  Total knee prosthesis in excellent position and alignment without any complicating features.  No periprosthetic fracture or lucency noted.  No patella alta or patella baja.    Assessment & Plan:   Problem List Items Addressed This Visit     Chronic pain associated with significant psychosocial dysfunction    He requires management of pain involving both knees  Cervical spine and shoulders with hydrocodone, which was refilled today.   Refill history confirmed via [place] Controlled Substance databas, accessed by me today..      Relevant Medications   traZODone (DESYREL) 100 MG tablet   clonazePAM (KLONOPIN) 1 MG tablet   HYDROcodone-acetaminophen (NORCO) 10-325 MG tablet   Essential hypertension - Primary   Relevant Orders   Comp Met (CMET)   Urine Microalbumin w/creat. ratio   Hyperlipidemia associated with type 2 diabetes mellitus ([place])   Relevant Orders   Lipid Profile   Hypothyroidism due to acquired atrophy of thyroid   Relevant Orders   TSH   MDD (major depressive disorder), recurrent episode, moderate (HCC)   Relevant Medications   traZODone (DESYREL) 100 MG tablet   Type 2 diabetes mellitus with diabetic neuropathy, unspecified ([place])   Relevant Orders   HgB A1c   Urine Microalbumin w/creat. ratio   Other Visit Diagnoses     GAD (generalized anxiety disorder)       Relevant Medications   traZODone (DESYREL) 100  MG tablet       I spent 30 minutes  dedicated to the care of this patient on the date of this encounter to include pre-visit review of patient's medical history,  most recent imaging studies, Face-to-face time with the patient , and post visit ordering of testing and therapeutics.    Follow-up: No follow-ups on file.   [name], MD

## 2021-06-06 NOTE — Assessment & Plan Note (Signed)
He requires management of pain involving both knees  Cervical spine and shoulders with hydrocodone, which was refilled today.   Refill history confirmed via [place] Controlled Substance databas, accessed by me today.Marland Kitchen

## 2021-06-07 LAB — LIPID PANEL
Cholesterol: 132 mg/dL (ref 0–200)
HDL: 40.9 mg/dL (ref >39.00)
LDL Cholesterol: 52 mg/dL (ref 0–99)
NonHDL: 90.68
Total CHOL/HDL Ratio: 3
Triglycerides: 192 mg/dL — ABNORMAL HIGH (ref 0.0–149.0)
VLDL: 38.4 mg/dL (ref 0.0–40.0)

## 2021-06-07 LAB — COMPREHENSIVE METABOLIC PANEL
ALT: 26 U/L (ref 0–53)
AST: 23 U/L (ref 0–37)
Albumin: 4.8 g/dL (ref 3.5–5.2)
Alkaline Phosphatase: 97 U/L (ref 39–117)
BUN: 12 mg/dL (ref 6–23)
CO2: 33 mEq/L — ABNORMAL HIGH (ref 19–32)
Calcium: 9.9 mg/dL (ref 8.4–10.5)
Chloride: 98 mEq/L (ref 96–112)
Creatinine, Ser: 0.93 mg/dL (ref 0.40–1.50)
GFR: 91.74 mL/min (ref >60.00)
Glucose, Bld: 108 mg/dL — ABNORMAL HIGH (ref 70–99)
Potassium: 3.9 mEq/L (ref 3.5–5.1)
Sodium: 139 mEq/L (ref 135–145)
Total Bilirubin: 0.5 mg/dL (ref 0.2–1.2)
Total Protein: 6.9 g/dL (ref 6.0–8.3)

## 2021-06-11 ENCOUNTER — Other Ambulatory Visit: Payer: Self-pay | Admitting: Internal Medicine

## 2021-06-14 ENCOUNTER — Telehealth: Payer: Self-pay | Admitting: Internal Medicine

## 2021-06-14 MED ORDER — METOPROLOL TARTRATE 25 MG PO TABS: 25.0000 mg | ORAL_TABLET | Freq: Two times a day (BID) | ORAL | 1 refills | Status: DC

## 2021-06-14 MED ORDER — CELECOXIB 200 MG PO CAPS: 200.0000 mg | ORAL_CAPSULE | Freq: Every day | ORAL | 1 refills | Status: DC

## 2021-06-14 MED ORDER — OMEPRAZOLE 40 MG PO CPDR: DELAYED_RELEASE_CAPSULE | ORAL | 1 refills | Status: DC

## 2021-06-14 NOTE — Telephone Encounter (Signed)
I have refilled the Omeprazole and the Metoprolol but Celebrex is not in the pt's current medication list.   Celebrex  Last refilled: 11/11/2020 Last OV: 06/06/2021 Next OV: not scheduled

## 2021-06-14 NOTE — Telephone Encounter (Signed)
Rx has been cancelled at pharmacy

## 2021-06-14 NOTE — Telephone Encounter (Signed)
He cannot be taking celebrex bc he is taking Xarelto.  Please call [facility] and cancel the celebrex that is just sent in.  I have cancelled it electronically

## 2021-06-14 NOTE — Telephone Encounter (Signed)
Courtney from [facility] called in requesting new scripts on medications (celecoxib (CELEBREX) 200 MG capsule),(omeprazole (PRILOSEC) 40 MG capsule), and (metoprolol tartrate (LOPRESSOR) 25 MG tablet). Courtney requesting for pt to receive a callback to advise that the medications has been refill.

## 2021-06-15 ENCOUNTER — Telehealth: Payer: Medicare HMO

## 2021-06-21 ENCOUNTER — Telehealth: Payer: Self-pay | Admitting: Pharmacist

## 2021-06-21 ENCOUNTER — Telehealth: Payer: Medicare HMO

## 2021-06-21 NOTE — Telephone Encounter (Signed)
°  Chronic Care Management   Note  06/21/2021 Name: BOYCE KELTNER MRN: [id number] DOB: [birth year]   Attempted to contact patient for scheduled appointment for medication management support. Left HIPAA compliant message for patient to return my call at their convenience.    Plan: - If I do not hear back from the patient by end of business today, will collaborate with Care Guide to outreach to schedule follow up with me  Catie Darnelle Maffucci, PharmD, [place], [place] Pharmacist [employer] at [employer] [phone]

## 2021-06-28 ENCOUNTER — Ambulatory Visit: Payer: Self-pay | Admitting: Pharmacist

## 2021-06-28 NOTE — Chronic Care Management (AMB) (Signed)
?  Chronic Care Management  ? ?Note ? ?06/28/2021 ?Name: [name] MRN: [id number] DOB: [birth year] ? ? ? ?Closing pharmacy CCM case at this time. Will collaborate with Care Guide to outreach to schedule follow up with RN CM. Patient has clinic contact information for future questions or concerns.  ? ?Catie Darnelle Maffucci, PharmD, [place], CPP ?Clinical Pharmacist ?Therapist, music at [employer] ?[phone] ? ?

## 2021-07-07 ENCOUNTER — Other Ambulatory Visit: Payer: Self-pay | Admitting: Internal Medicine

## 2021-07-07 ENCOUNTER — Telehealth: Payer: Self-pay | Admitting: Internal Medicine

## 2021-07-07 MED ORDER — CLONAZEPAM 2 MG PO TABS: 1.0000 mg | ORAL_TABLET | Freq: Two times a day (BID) | ORAL | 1 refills | Status: DC | PRN

## 2021-07-07 NOTE — Telephone Encounter (Signed)
Pharmacy is called because they are having issues with clonazepam.  1 mg is on back order and the pharmacy need to see if the provider want them to switched to half mg and take twice a day or a 2 mg and break the pill in half ?

## 2021-07-07 NOTE — Telephone Encounter (Signed)
I have refilled at the 2 mg dose .  Please remind [name] to CUT THEM IN HALF  ?

## 2021-07-07 NOTE — Telephone Encounter (Signed)
Spoke with pt and he stated that he has enough to get him through today ?

## 2021-07-08 NOTE — Telephone Encounter (Signed)
LMTCB

## 2021-07-08 NOTE — Telephone Encounter (Signed)
Pt returning call. Pt requesting callback.  °

## 2021-07-08 NOTE — Telephone Encounter (Signed)
Spoke with pt to let him know that the 2 mg dose was sent in and that he will need to cut them in half. Pt gave a verbal understanding.  ?

## 2021-08-05 ENCOUNTER — Other Ambulatory Visit: Payer: Self-pay | Admitting: Internal Medicine

## 2021-08-06 ENCOUNTER — Encounter: Payer: Self-pay | Admitting: Internal Medicine

## 2021-08-08 NOTE — Telephone Encounter (Signed)
Medication pended for refill.

## 2021-08-09 MED ORDER — HYDROCODONE-ACETAMINOPHEN 10-325 MG PO TABS: 1.0000 | ORAL_TABLET | Freq: Two times a day (BID) | ORAL | 0 refills | Status: DC | PRN

## 2021-08-15 ENCOUNTER — Telehealth: Payer: Medicare HMO

## 2021-09-01 ENCOUNTER — Encounter (HOSPITAL_COMMUNITY): Payer: Self-pay | Admitting: Psychiatry

## 2021-09-01 ENCOUNTER — Telehealth (HOSPITAL_BASED_OUTPATIENT_CLINIC_OR_DEPARTMENT_OTHER): Payer: Medicare HMO | Admitting: Psychiatry

## 2021-09-01 VITALS — Wt 230.0 lb

## 2021-09-01 DIAGNOSIS — F419 Anxiety disorder, unspecified: Secondary | ICD-10-CM | POA: Diagnosis not present

## 2021-09-01 DIAGNOSIS — F4321 Adjustment disorder with depressed mood: Secondary | ICD-10-CM

## 2021-09-01 DIAGNOSIS — F331 Major depressive disorder, recurrent, moderate: Secondary | ICD-10-CM

## 2021-09-01 MED ORDER — BUPROPION HCL ER (XL) 300 MG PO TB24: 300.0000 mg | ORAL_TABLET | Freq: Every day | ORAL | 0 refills | Status: DC

## 2021-09-01 MED ORDER — ARIPIPRAZOLE 5 MG PO TABS: ORAL_TABLET | ORAL | 1 refills | Status: DC

## 2021-09-01 NOTE — Progress Notes (Signed)
Virtual Visit via Telephone Note ? ?I connected with [name] on 09/01/21 at 10:00 AM EDT by telephone and verified that I am speaking with the correct person using two identifiers. ? ?Location: ?Patient: Home ?Provider: Home Office ?  ?I discussed the limitations, risks, security and privacy concerns of performing an evaluation and management service by telephone and the availability of in person appointments. I also discussed with the patient that there may be a patient responsible charge related to this service. The patient expressed understanding and agreed to proceed. ? ? ?History of Present Illness: ?Patient is evaluated by phone session.  He is not doing very well and reported lately having crying spells, struggle with motivation to do things.  He feels anxious and nervous.  He is sleeping on and off.  He admitted missing his wife who has been deceased last 11-08-22.  He lives by himself.  He had family member support but does not go outside as he like to.  He admitted he need to have more social support and may be looking to therapy.  He was getting grief counseling from hospice but he had stopped.  He had a total of 10 sessions.  When I ask about his medication.  He is taking BuSpar 15 mg twice a day even though it is written 3 times a day.  Patient is not sure why he is taking twice a day.  He is also taking Klonopin, trazodone, and gabapentin which is given by her primary care physician Dr. Kalman Jewels.  He is taking Klonopin 2 mg but half tablet twice a day.  We are giving him Wellbutrin and Abilify.  He has no tremor or shakes or any EPS.  His appetite is fair and his energy level is low.  He denies any suicidal thoughts, paranoia or any hallucination.  He admitted sometime fatigue, tired and not motivated to do things.  Recently he had blood work and his hemoglobin A1c is 6.7. ?  ?Past Psychiatric History: Reviewed. ?H/O depression and anxiety.  No h/o suicidal attempt or inpatient treatment.  Seen Dr.  Jake Michaelis and given Zoloft, Klonopin and Cymbalta.  Later PCP added BuSpar and trazodone.  We tried Lamictal but did not help.  ? ?Recent Results (from the past 2160 hour(s))  ?HgB A1c     Status: Abnormal  ? Collection Time: 06/06/21 10:32 AM  ?Result Value Ref Range  ? Hgb A1c MFr Bld 6.7 (H) 4.6 - 6.5 %  ?  Comment: Glycemic Control Guidelines for People with Diabetes:Non Diabetic:  <6%Goal of Therapy: <7%Additional Action Suggested:  >8%   ?Lipid Profile     Status: Abnormal  ? Collection Time: 06/06/21 10:32 AM  ?Result Value Ref Range  ? Cholesterol 132 0 - 200 mg/dL  ?  Comment: ATP III Classification       Desirable:  < 200 mg/dL               Borderline High:  200 - 239 mg/dL          High:  > = 240 mg/dL  ? Triglycerides 192.0 (H) 0.0 - 149.0 mg/dL  ?  Comment: Normal:  <150 mg/dLBorderline High:  150 - 199 mg/dL  ? HDL 40.90 >39.00 mg/dL  ? VLDL 38.4 0.0 - 40.0 mg/dL  ? LDL Cholesterol 52 0 - 99 mg/dL  ? Total CHOL/HDL Ratio 3   ?  Comment:  Men          Women1/2 Average Risk     3.4          3.3Average Risk          5.0          4.42X Average Risk          9.6          7.13X Average Risk          15.0          11.0                      ? NonHDL 90.68   ?  Comment: NOTE:  Non-HDL goal should be 30 mg/dL higher than patient's LDL goal (i.e. LDL goal of < 70 mg/dL, would have non-HDL goal of < 100 mg/dL)  ?Comp Met (CMET)     Status: Abnormal  ? Collection Time: 06/06/21 10:32 AM  ?Result Value Ref Range  ? Sodium 139 135 - 145 mEq/L  ? Potassium 3.9 3.5 - 5.1 mEq/L  ? Chloride 98 96 - 112 mEq/L  ? CO2 33 (H) 19 - 32 mEq/L  ? Glucose, Bld 108 (H) 70 - 99 mg/dL  ? BUN 12 6 - 23 mg/dL  ? Creatinine, Ser 0.93 0.40 - 1.50 mg/dL  ? Total Bilirubin 0.5 0.2 - 1.2 mg/dL  ? Alkaline Phosphatase 97 39 - 117 U/L  ? AST 23 0 - 37 U/L  ? ALT 26 0 - 53 U/L  ? Total Protein 6.9 6.0 - 8.3 g/dL  ? Albumin 4.8 3.5 - 5.2 g/dL  ? GFR 91.74 >60.00 mL/min  ?  Comment: Calculated using the CKD-EPI Creatinine Equation  (2021)  ? Calcium 9.9 8.4 - 10.5 mg/dL  ?TSH     Status: None  ? Collection Time: 06/06/21 10:32 AM  ?Result Value Ref Range  ? TSH 4.79 0.35 - 5.50 uIU/mL  ?Urine Microalbumin w/creat. ratio     Status: Abnormal  ? Collection Time: 06/06/21 10:32 AM  ?Result Value Ref Range  ? Microalb, Ur 4.2 (H) 0.0 - 1.9 mg/dL  ? Creatinine,U 92.1 mg/dL  ? Microalb Creat Ratio 4.6 0.0 - 30.0 mg/g  ?  ? ?Psychiatric Specialty Exam: ?Physical Exam  ?Review of Systems  ?Weight 230 lb (104.3 kg).Body mass index is 32.08 kg/m?.  ?General Appearance: NA  ?Eye Contact:  NA  ?Speech:  Slow  ?Volume:  Decreased  ?Mood:  Dysphoric  ?Affect:  NA  ?Thought Process:  Descriptions of Associations: Intact  ?Orientation:  Full (Time, Place, and Person)  ?Thought Content:  Rumination  ?Suicidal Thoughts:  No  ?Homicidal Thoughts:  No  ?Memory:  Immediate;   Good ?Recent;   Fair ?Remote;   Fair  ?Judgement:  Intact  ?Insight:  Present  ?Psychomotor Activity:  NA  ?Concentration:  Concentration: Fair and Attention Span: Fair  ?Recall:  Good  ?Fund of Knowledge:  Good  ?Language:  Good  ?Akathisia:  No  ?Handed:  Right  ?AIMS (if indicated):     ?Assets:  Communication Skills ?Desire for Improvement ?Housing ?Transportation  ?ADL's:  Intact  ?Cognition:  WNL  ?Sleep:   fair  ? ? ? ? ?Assessment and Plan: ?Major depressive disorder, recurrent.  Anxiety.  Grief. ? ?I reviewed blood work results and current medication.  His last hemoglobin A1c is 6.7.  Recommended to take his BuSpar 15 mg 3 times a day which is  prescribed as patient taking only twice a day.  I recommend he can take higher dose of Abilify from 3m to 5 mg but should try taking the BuSpar as prescribed.  I also encouraged to go back to grief counseling.patient had previous session through hospice and he like to go back there.  Encourage walking, exercise and spend more time outside which usually helps him.  We will give him a new prescription of Abilify which she will take half to 1  tablet at bedtime.  Continue Wellbutrin XL 300 mg daily.  He is getting gabapentin, trazodone, BuSpar and Klonopin from his primary care physician.  I will forward my note to his PCP.  Recommended to call [facility] if there is any question or any concern.  Discussed safety concerns and anytime having active suicidal thoughts or homicidal thought that he need to call 911 or go to local emergency room.  We will follow up in 4 to 6 weeks. ? ? ?Follow Up Instructions: ? ?  ?I discussed the assessment and treatment plan with the patient. The patient was provided an opportunity to ask questions and all were answered. The patient agreed with the plan and demonstrated an understanding of the instructions. ?  ?The patient was advised to call back or seek an in-person evaluation if the symptoms worsen or if the condition fails to improve as anticipated. ? ?Collaboration of Care: Primary Care Provider AEB we will forward notes to his PCP. ? ?Patient/Guardian was advised Release of Information must be obtained prior to any record release in order to collaborate their care with an outside provider. Patient/Guardian was advised if they have not already done so to contact the registration department to sign all necessary forms in order for [facility] release information regarding their care.  ? ?Consent: Patient/Guardian gives verbal consent for treatment and assignment of benefits for services provided during this visit. Patient/Guardian expressed understanding and agreed to proceed.   ? ?I provided 25 minutes of non-face-to-face time during this encounter. ? ? ?SKathlee Nations MD  ?

## 2021-09-12 ENCOUNTER — Encounter: Payer: Self-pay | Admitting: Internal Medicine

## 2021-09-12 ENCOUNTER — Other Ambulatory Visit: Payer: Self-pay

## 2021-09-12 MED ORDER — CLONAZEPAM 2 MG PO TABS: 1.0000 mg | ORAL_TABLET | Freq: Two times a day (BID) | ORAL | 1 refills | Status: DC | PRN

## 2021-09-12 NOTE — Telephone Encounter (Signed)
Sent as a refill request.

## 2021-09-12 NOTE — Telephone Encounter (Signed)
Pt is out of medication  Refilled: 07/07/2021 Last OV: 06/06/2021 Next OV: 09/30/2021

## 2021-09-15 DIAGNOSIS — E119 Type 2 diabetes mellitus without complications: Secondary | ICD-10-CM | POA: Diagnosis not present

## 2021-09-15 DIAGNOSIS — Z0100 Encounter for examination of eyes and vision without abnormal findings: Secondary | ICD-10-CM | POA: Diagnosis not present

## 2021-09-15 LAB — HM DIABETES EYE EXAM

## 2021-09-16 ENCOUNTER — Ambulatory Visit: Payer: Medicare HMO | Admitting: Podiatry

## 2021-09-16 ENCOUNTER — Encounter: Payer: Self-pay | Admitting: Podiatry

## 2021-09-16 DIAGNOSIS — M5416 Radiculopathy, lumbar region: Secondary | ICD-10-CM

## 2021-09-16 NOTE — Progress Notes (Signed)
HPI: 56 y.o. male presenting today for worsening numbness with tingling and loss of sensation to the bilateral feet.  Patient states that over the last 2 years the numbness and tingling has increased.  He is concerned for loss of balance.  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   Anxiety    Anxiety and depression    Arthritis    knees and shoulders   Benzodiazepine withdrawal with complication ([place]) 66/09/3014   Bruit    L   Chest pain    hx   Cognitive complaints with normal neuropsychological exam 10/23/2016   Diabetes mellitus without complication ([facility])    "borderline", diet controlled, no meds, patient has lost 30 lbs   Edema    Fatty liver    GERD (gastroesophageal reflux disease)    uses Omeprazole   Goiter    HLD (hyperlipidemia)    Hypertension    essential, benign   Hypothyroidism    Impotence of organic origin    Murmur    never has caused any problems   Neuromuscular disorder ([place])    Other chest pain    tightness, pressure   Palpitation    hx   Precordial pain    Sleep apnea    uses cpap    Past Surgical History:  Procedure Laterality Date   APPENDECTOMY  2002   done at [place] N/A 08/27/2015   Procedure: Left Heart Cath and Coronary Angiography;  Surgeon: Jolaine Artist, MD;  Location: [facility];  Service: Cardiovascular;  Laterality: N/A;   COLONOSCOPY  in his 20's   EAR CYST EXCISION Left 07/08/2019   Procedure: open excision baker's cyst left knee;  Surgeon: Meredith Pel, MD;  Location: [facility];  Service: Orthopedics;  Laterality: Left;   HERNIA REPAIR  0-10-93   umbilical   JOINT REPLACEMENT     right knee   KNEE ARTHROSCOPY Left 12/17/2018   Procedure: left knee arthroscopy, meniscal debridement, loose body removal;  Surgeon: Meredith Pel, MD;  Location: [facility];  Service: Orthopedics;  Laterality: Left;   REVISION TOTAL KNEE ARTHROPLASTY Right 06/23/2014   DR DEAN   TONSILLECTOMY     TOTAL KNEE ARTHROPLASTY Right 2011   right   TOTAL KNEE ARTHROPLASTY Left 02/10/2021   Procedure: LEFT TOTAL KNEE ARTHROPLASTY;  Surgeon: Meredith Pel, MD;  Location: [facility];  Service: Orthopedics;  Laterality: Left;   TOTAL KNEE REVISION Right 06/23/2014   Procedure: TOTAL KNEE REVISION;  Surgeon: Meredith Pel, MD;  Location: [facility];  Service: Orthopedics;  Laterality: Right;   UPPER GASTROINTESTINAL ENDOSCOPY  04/30/2013   Judieth Keens TOOTH EXTRACTION      Allergies  Allergen Reactions   Ambien [Zolpidem] Swelling and Other (See Comments)    Swelling in the throat     Physical Exam: General: The patient is alert and oriented x3 in no acute distress.  Dermatology: Skin is warm, dry and supple bilateral lower extremities. Negative for open lesions or macerations.  Vascular: Palpable pedal pulses bilaterally. Capillary refill within normal limits.  Negative for any significant edema or erythema  Neurological: Light touch and protective threshold diminished  Musculoskeletal Exam: No pedal deformities noted   Assessment: 1.  Chronic lumbar radiculopathy 2.  Worsening  peripheral polyneuropathy   Plan of Care:  1. Patient evaluated.  2.  After evaluating the patient I do believe that the majority the patient's symptoms are related to his lumbar spine.  He does have a chronic history of lower back pathology.  He says that about 1-1/2 years ago he did have an MRI and he was diagnosed with a bulging disc.  He has not seen his neuro spine specialist since 3.  Recommend follow-up with neuro spine 4.  Continue good management of his diabetes with his PCP, Dr. Derrel Nip 5.  Return to clinic as needed     Edrick Kins, DPM [facility]  Dr. Edrick Kins, DPM    [address], [place] [address]                Office [phone]  Fax [phone]

## 2021-09-20 ENCOUNTER — Telehealth: Payer: Self-pay | Admitting: Physical Medicine and Rehabilitation

## 2021-09-20 NOTE — Telephone Encounter (Signed)
Pt called requesting to set an appt for back and leg pains. Please call pt at [phone].

## 2021-09-22 ENCOUNTER — Ambulatory Visit: Payer: Medicare HMO | Admitting: Physical Medicine and Rehabilitation

## 2021-09-22 ENCOUNTER — Encounter: Payer: Self-pay | Admitting: Physical Medicine and Rehabilitation

## 2021-09-22 DIAGNOSIS — R202 Paresthesia of skin: Secondary | ICD-10-CM

## 2021-09-22 DIAGNOSIS — M5416 Radiculopathy, lumbar region: Secondary | ICD-10-CM | POA: Diagnosis not present

## 2021-09-22 DIAGNOSIS — G894 Chronic pain syndrome: Secondary | ICD-10-CM | POA: Diagnosis not present

## 2021-09-22 DIAGNOSIS — M4726 Other spondylosis with radiculopathy, lumbar region: Secondary | ICD-10-CM

## 2021-09-22 NOTE — Progress Notes (Signed)
[name] - 56 y.o. male MRN [id number]  Date of birth: 1966/03/17  Office Visit Note: Visit Date: 09/22/2021 PCP: [name], MD Referred by: [name], MD  Subjective: Chief Complaint  Patient presents with   Lower Back - Numbness, Pain    C/o numbness and tingling in the feet and legs that it has gotten a lot worse. Causing him to have a problem with balance too. Onset like this x 2 years. These sxs are getting worse.    HPI: [name] is a 56 y.o. male who comes in today for evaluation of chronic, worsening and severe bilateral lower back pain radiating down both legs to feet. Patient reports pain ongoing for several years. Patient reports pain is exacerbated by walking, standing, movement and activity. He describes pain as sore, aching and numbness/tingling sensation to bilateral legs/feet, currently rates pain as 8 out of 10. Patient reports some relief of pain with home exercise regimen, rest and use of medications. He is currently taking Gabapentin, also taking Norco 10-325 mg that is prescribed by his primary care provider Dr. Deborra Medina.  Patients lumbar MRI from 2020 exhibits moderate facet degeneration and shallow left subarticular recess and foraminal protrusion at the level of L3-L4. No nerve root compression or spinal canal stenosis noted. Patient had left L3 transforaminal epidural steroid injection performed in our office on 06/13/2021 and reports some relief of pain for approximately 2 days. Patient states his pain has become significantly worse and feel the numbness/tingling sensation to legs and feet are causing him to have weakness, difficulty walking and balance issues. Patient was recently seen by Dr. Daylene Katayama with [facility] in [place], per his notes numbness and tingling sensation to bilateral feet could be related to lumbar spine. Patient states his pain is negatively impacting his life and is causing difficulty performing daily  tasks. Patient denies recent trauma or falls.   Review of Systems  Cardiovascular:  Positive for leg swelling.  Musculoskeletal:  Positive for back pain.  Neurological:  Positive for tingling and weakness.       Pt reports numbness/tingling to bilateral legs and feet, also reports weakness to bilateral lower extremities and issues with balance when walking.   All other systems reviewed and are negative. Otherwise per HPI.  Assessment & Plan: Visit Diagnoses:    ICD-10-CM   1. Lumbar radiculopathy  M54.16 MR LUMBAR SPINE WO CONTRAST    2. Other spondylosis with radiculopathy, lumbar region  M47.26 MR LUMBAR SPINE WO CONTRAST    3. Paresthesia of skin  R20.2     4. Chronic pain syndrome  G89.4        Plan: Findings:  Chronic, worsening and severe bilateral lower back pain radiating to legs and feet. Patient continues to have severe pain and numbness/tingling despite good conservative therapies such as home exercise regimen, rest and use of medications. Patients clinical presentation and exam are complex, 2020 lumbar MRI findings do not directly correlate with his symptoms. I did discuss lumbar MRI findings with patient using images and spine model, I did reassure him that from a lumbar spine standpoint I did not see any concerning findings, nothing alarming that would explain his symptoms. We believe the next step is to obtain new lumbar MRI imaging to assess for possible spinal canal stenosis/nerve compression. Depending on lumbar MRI results we did discuss performing lumbar epidural steroid injection. If his symptoms persist he could benefit from consultation with  neurologist. Patient encouraged to remain active, no red flag symptoms noted upon exam today.    Meds & Orders: No orders of the defined types were placed in this encounter.   Orders Placed This Encounter  Procedures   MR LUMBAR SPINE WO CONTRAST    Follow-up: Return for follow up after lumbar MRI imaging is obtained for  review.   Procedures: No procedures performed      Clinical History: EXAM: MRI LUMBAR SPINE WITHOUT CONTRAST   TECHNIQUE: Multiplanar, multisequence MR imaging of the lumbar spine was performed. No intravenous contrast was administered.   COMPARISON:  Plain films lumbar spine from [facility] 08/16/2018. MRI lumbar spine 09/30/2017.   FINDINGS: Segmentation:  Standard.   Alignment:  Trace retrolisthesis L2 on L3 and L3 on L4 is unchanged.   Vertebrae:  No fracture, evidence of discitis, or bone lesion.   Conus medullaris and cauda equina: Conus extends to the L1-2 level. Conus and cauda equina appear normal.   Paraspinal and other soft tissues: Negative.   Disc levels:   T11-12 is imaged in the sagittal plane only and negative.   T12-L1: Negative.   L1-2: Negative.   L2-3: Shallow disc bulge.  No stenosis.   L3-4: Moderate facet degenerative disease. Shallow left subarticular recess and foraminal protrusion is unchanged. There is minimal narrowing in the left subarticular recess. The foramina remain open.   L4-5: Mild facet arthropathy.  Otherwise negative.   L5-S1: Mild facet arthropathy and a shallow disc bulge. No stenosis.   IMPRESSION: No new abnormality since the prior MRI.   Shallow left subarticular recess and foraminal protrusion at L3-4 causes mild narrowing in the left subarticular recess. No nerve root compression is identified.     Electronically Signed   By: Inge Rise M.D.   On: 12/06/2018 09:24   He reports that he quit smoking about 16 years ago. His smoking use included cigarettes. He has a 36.00 pack-year smoking history. He has never used smokeless tobacco.  Recent Labs    12/16/20 0845 02/07/21 1016 06/06/21 1032  HGBA1C 6.6* 6.5* 6.7*    Objective:  VS:  HT:    WT:   BMI:     BP:   HR: bpm  TEMP: ( )  RESP:  Physical Exam Vitals and nursing note reviewed.  HENT:     Head: Normocephalic and atraumatic.      Right Ear: External ear normal.     Left Ear: External ear normal.     Nose: Nose normal.     Mouth/Throat:     Mouth: Mucous membranes are moist.  Eyes:     Extraocular Movements: Extraocular movements intact.  Cardiovascular:     Rate and Rhythm: Normal rate.     Pulses: Normal pulses.  Pulmonary:     Effort: Pulmonary effort is normal.  Abdominal:     General: Abdomen is flat. There is no distension.  Musculoskeletal:        General: Tenderness present.     Cervical back: Normal range of motion.     Right lower leg: 2+ Edema present.     Left lower leg: 2+ Pitting Edema present.     Comments: Pt rises from seated position to standing without difficulty. Good lumbar range of motion. Strong distal strength without clonus, no pain upon palpation of greater trochanters. Sensation intact bilaterally. Walks independently, gait steady. Negative Romberg test.   Skin:    General: Skin is warm and dry.  Capillary Refill: Capillary refill takes less than 2 seconds.     Comments: Flushing/redness noted to face  Neurological:     General: No focal deficit present.     Mental Status: He is alert and oriented to person, place, and time.     GCS: GCS eye subscore is 4. GCS verbal subscore is 5. GCS motor subscore is 6.     Sensory: Sensation is intact.     Motor: Motor function is intact.     Coordination: Coordination is intact.     Gait: Gait is intact.  Psychiatric:        Mood and Affect: Mood normal.        Behavior: Behavior normal.    Ortho Exam  Imaging: No results found.  Past Medical/Family/Surgical/Social History: Medications & Allergies reviewed per EMR, new medications updated. Patient Active Problem List   Diagnosis Date Noted   Chronic pain associated with significant psychosocial dysfunction 03/05/2021   DVT, lower extremity, distal, acute, left ([place]) 03/05/2021   Arthritis of left knee    Osteoarthritis 02/10/2021   S/P TKR (total knee replacement), left  02/10/2021   Grief counseling 11/14/2020   Abnormal electrocardiogram (ECG) (EKG) 09/02/2020   Preoperative evaluation to rule out surgical contraindication 09/02/2020   MDD (major depressive disorder), recurrent episode, moderate ([place]) 05/29/2020   Colon cancer screening 02/02/2020   Eczema of both external ears 12/08/2019   1+ pitting edema 12/08/2019   Polyarthritis of multiple sites 10/27/2019   Coronary artery calcification seen on CT scan 10/27/2019   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 ([place]) 08/26/2019   Radiculopathy, cervical region 06/10/2019   Carpal tunnel syndrome, left upper limb 06/10/2019   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   Spinal stenosis of cervical region 12/31/2018   Loose body of left knee    Plantar fasciitis of left foot 08/24/2017   Chronic pain of both hips 06/20/2017   Complete rotator cuff tear of left shoulder 09/14/2015   Hypothyroidism due to acquired atrophy of thyroid 09/14/2015   Arthritis of knee 06/23/2014   Encounter for preventive health examination 06/17/2014   Enlarged RV (right ventricle) 06/17/2014   GERD (gastroesophageal reflux disease) 04/20/2014   Cervical spine degeneration 02/17/2014   Overweight 10/29/2013   Chronic knee pain 10/29/2013   Type 2 diabetes mellitus with diabetic neuropathy, unspecified ([place]) 10/23/2012   Seborrheic dermatitis, unspecified 05/17/2012   Fatty liver 04/22/2012   Hypogonadism male 03/12/2012   Chronic pain of multiple joints 12/24/2010   Sciatica of left side associated with disorder of lumbar spine 12/24/2010   Hyperlipidemia associated with type 2 diabetes mellitus ([place]) 01/13/2009   Anxiety state 01/13/2009   Essential hypertension 01/13/2009   OSA (obstructive sleep apnea) 01/13/2009   Past Medical History:  Diagnosis Date   Acute medial meniscus tear of left knee    Alcohol-induced chronic pancreatitis ([place]) 11/20/2016   Anxiety    Anxiety and  depression    Arthritis    knees and shoulders   Benzodiazepine withdrawal with complication ([place]) 02/18/2535   Bruit    L   Chest pain    hx   Cognitive complaints with normal neuropsychological exam 10/23/2016   Diabetes mellitus without complication ([place])    "borderline", diet controlled, no meds, patient has lost 30 lbs   Edema    Fatty liver    GERD (gastroesophageal reflux disease)    uses Omeprazole   Goiter    HLD (hyperlipidemia)    Hypertension  essential, benign   Hypothyroidism    Impotence of organic origin    Murmur    never has caused any problems   Neuromuscular disorder ([place])    Other chest pain    tightness, pressure   Palpitation    hx   Precordial pain    Sleep apnea    uses cpap   Family History  Problem Relation Age of Onset   Heart disease Father    Colon cancer Neg Hx    Colon polyps Neg Hx    Esophageal cancer Neg Hx    Rectal cancer Neg Hx    Stomach cancer Neg Hx    Past Surgical History:  Procedure Laterality Date   APPENDECTOMY  2002   done at [facility] N/A 08/27/2015   Procedure: Left Heart Cath and Coronary Angiography;  Surgeon: Jolaine Artist, MD;  Location: [facility];  Service: Cardiovascular;  Laterality: N/A;   COLONOSCOPY  in his 20's   EAR CYST EXCISION Left 07/08/2019   Procedure: open excision baker's cyst left knee;  Surgeon: Meredith Pel, MD;  Location: [facility];  Service: Orthopedics;  Laterality: Left;   HERNIA REPAIR  0-13-14   umbilical   JOINT REPLACEMENT     right knee   KNEE ARTHROSCOPY Left 12/17/2018   Procedure: left knee arthroscopy, meniscal debridement, loose body removal;  Surgeon: Meredith Pel, MD;  Location: [facility];  Service: Orthopedics;  Laterality: Left;   REVISION TOTAL KNEE ARTHROPLASTY Right 06/23/2014   DR DEAN   TONSILLECTOMY     TOTAL KNEE ARTHROPLASTY Right 2011   right   TOTAL KNEE ARTHROPLASTY Left 02/10/2021    Procedure: LEFT TOTAL KNEE ARTHROPLASTY;  Surgeon: Meredith Pel, MD;  Location: [facility];  Service: Orthopedics;  Laterality: Left;   TOTAL KNEE REVISION Right 06/23/2014   Procedure: TOTAL KNEE REVISION;  Surgeon: Meredith Pel, MD;  Location: [facility];  Service: Orthopedics;  Laterality: Right;   UPPER GASTROINTESTINAL ENDOSCOPY  04/30/2013   Judieth Keens TOOTH EXTRACTION     Social History   Occupational History   Occupation: Personnel officer: Public relations account executive   Occupation: SERVICE DIRECTOR    Employer: [employer]  Tobacco Use   Smoking status: Former    Packs/day: 2.00    Years: 18.00    Pack years: 36.00    Types: Cigarettes    Quit date: 04/24/2005    Years since quitting: 16.4   Smokeless tobacco: Never   Tobacco comments:    quit 20 years ago   Vaping Use   Vaping Use: Never used  Substance and Sexual Activity   Alcohol use: Not Currently    Alcohol/week: 4.0 standard drinks    Types: 4 Cans of beer per week   Drug use: No   Sexual activity: Yes

## 2021-09-28 ENCOUNTER — Ambulatory Visit
Admission: RE | Admit: 2021-09-28 | Discharge: 2021-09-28 | Disposition: A | Discharge status: Home / Self Care | Payer: Medicare HMO | Source: Ambulatory Visit | Attending: Physical Medicine and Rehabilitation | Admitting: Physical Medicine and Rehabilitation

## 2021-09-28 DIAGNOSIS — M545 Low back pain, unspecified: Secondary | ICD-10-CM | POA: Diagnosis not present

## 2021-09-28 DIAGNOSIS — M5416 Radiculopathy, lumbar region: Secondary | ICD-10-CM | POA: Diagnosis not present

## 2021-09-29 ENCOUNTER — Encounter: Payer: Self-pay | Admitting: Internal Medicine

## 2021-09-29 ENCOUNTER — Telehealth: Payer: Self-pay

## 2021-09-29 NOTE — Telephone Encounter (Signed)
LMTCB for pre charting before appt on 09/30/2021.

## 2021-09-30 ENCOUNTER — Other Ambulatory Visit: Payer: Self-pay | Admitting: Internal Medicine

## 2021-09-30 ENCOUNTER — Encounter: Payer: Self-pay | Admitting: Internal Medicine

## 2021-09-30 ENCOUNTER — Ambulatory Visit (INDEPENDENT_AMBULATORY_CARE_PROVIDER_SITE_OTHER): Payer: Medicare HMO | Admitting: Internal Medicine

## 2021-09-30 ENCOUNTER — Other Ambulatory Visit: Payer: Self-pay

## 2021-09-30 VITALS — BP 122/82 | HR 63 | Temp 97.6°F | Ht 72.0 in | Wt 231.0 lb

## 2021-09-30 DIAGNOSIS — E034 Atrophy of thyroid (acquired): Secondary | ICD-10-CM

## 2021-09-30 DIAGNOSIS — E1140 Type 2 diabetes mellitus with diabetic neuropathy, unspecified: Secondary | ICD-10-CM

## 2021-09-30 DIAGNOSIS — R200 Anesthesia of skin: Secondary | ICD-10-CM | POA: Diagnosis not present

## 2021-09-30 DIAGNOSIS — M21372 Foot drop, left foot: Secondary | ICD-10-CM

## 2021-09-30 DIAGNOSIS — F411 Generalized anxiety disorder: Secondary | ICD-10-CM

## 2021-09-30 DIAGNOSIS — I824Z2 Acute embolism and thrombosis of unspecified deep veins of left distal lower extremity: Secondary | ICD-10-CM

## 2021-09-30 DIAGNOSIS — E785 Hyperlipidemia, unspecified: Secondary | ICD-10-CM | POA: Diagnosis not present

## 2021-09-30 DIAGNOSIS — G894 Chronic pain syndrome: Secondary | ICD-10-CM

## 2021-09-30 DIAGNOSIS — I10 Essential (primary) hypertension: Secondary | ICD-10-CM | POA: Diagnosis not present

## 2021-09-30 DIAGNOSIS — M5386 Other specified dorsopathies, lumbar region: Secondary | ICD-10-CM

## 2021-09-30 DIAGNOSIS — E538 Deficiency of other specified B group vitamins: Secondary | ICD-10-CM | POA: Diagnosis not present

## 2021-09-30 DIAGNOSIS — E1169 Type 2 diabetes mellitus with other specified complication: Secondary | ICD-10-CM

## 2021-09-30 DIAGNOSIS — E663 Overweight: Secondary | ICD-10-CM | POA: Diagnosis not present

## 2021-09-30 DIAGNOSIS — R202 Paresthesia of skin: Secondary | ICD-10-CM

## 2021-09-30 DIAGNOSIS — Z86718 Personal history of other venous thrombosis and embolism: Secondary | ICD-10-CM

## 2021-09-30 DIAGNOSIS — F331 Major depressive disorder, recurrent, moderate: Secondary | ICD-10-CM

## 2021-09-30 LAB — COMPREHENSIVE METABOLIC PANEL
ALT: 35 U/L (ref 0–53)
AST: 27 U/L (ref 0–37)
Albumin: 4.2 g/dL (ref 3.5–5.2)
Alkaline Phosphatase: 89 U/L (ref 39–117)
BUN: 10 mg/dL (ref 6–23)
CO2: 33 mEq/L — ABNORMAL HIGH (ref 19–32)
Calcium: 9.1 mg/dL (ref 8.4–10.5)
Chloride: 100 mEq/L (ref 96–112)
Creatinine, Ser: 0.89 mg/dL (ref 0.40–1.50)
GFR: 95.54 mL/min (ref >60.00)
Glucose, Bld: 94 mg/dL (ref 70–99)
Potassium: 4 mEq/L (ref 3.5–5.1)
Sodium: 139 mEq/L (ref 135–145)
Total Bilirubin: 0.5 mg/dL (ref 0.2–1.2)
Total Protein: 6.3 g/dL (ref 6.0–8.3)

## 2021-09-30 LAB — TSH: TSH: 2.04 u[IU]/mL (ref 0.35–5.50)

## 2021-09-30 LAB — LIPID PANEL
Cholesterol: 161 mg/dL (ref 0–200)
HDL: 35.5 mg/dL — ABNORMAL LOW (ref >39.00)
NonHDL: 125.8
Total CHOL/HDL Ratio: 5
Triglycerides: 231 mg/dL — ABNORMAL HIGH (ref 0.0–149.0)
VLDL: 46.2 mg/dL — ABNORMAL HIGH (ref 0.0–40.0)

## 2021-09-30 LAB — B12 AND FOLATE PANEL
Folate: >24.2 ng/mL (ref >5.9)
Vitamin B-12: 279 pg/mL (ref 211–911)

## 2021-09-30 LAB — HEMOGLOBIN A1C: Hgb A1c MFr Bld: 6.8 % — ABNORMAL HIGH (ref 4.6–6.5)

## 2021-09-30 LAB — LDL CHOLESTEROL, DIRECT: Direct LDL: 116 mg/dL

## 2021-09-30 MED ORDER — ATORVASTATIN CALCIUM 80 MG PO TABS
80.0000 mg | ORAL_TABLET | Freq: Every day | ORAL | 1 refills | Status: DC
Start: 2021-09-30 — End: 2021-12-14

## 2021-09-30 MED ORDER — ATORVASTATIN CALCIUM 80 MG PO TABS: 80.0000 mg | ORAL_TABLET | Freq: Every day | ORAL | 3 refills | Status: DC

## 2021-09-30 MED ORDER — FUROSEMIDE 20 MG PO TABS: 20.0000 mg | ORAL_TABLET | ORAL | 3 refills | Status: DC

## 2021-09-30 MED ORDER — BLOOD GLUCOSE METER KIT
PACK | 0 refills | Status: DC
Start: 2021-09-30 — End: 2021-10-04

## 2021-09-30 MED ORDER — HYDROCODONE-ACETAMINOPHEN 10-325 MG PO TABS: 1.0000 | ORAL_TABLET | Freq: Two times a day (BID) | ORAL | 0 refills | Status: DC | PRN

## 2021-09-30 MED ORDER — NALOXONE HCL 4 MG/0.1ML NA LIQD: NASAL | 1 refills | Status: AC

## 2021-09-30 NOTE — Progress Notes (Unsigned)
Need refill on Hydrocodone...gluccometer is not working anymore needs new one

## 2021-09-30 NOTE — Patient Instructions (Signed)
Do NOT mix alcohol with clonazepa or with hydrocodone  Naloxone nasal spray prescription sent to [facility] in case of opioid overdose   Neurology referral in progress

## 2021-09-30 NOTE — Progress Notes (Unsigned)
Subjective:  Patient ID: [name], male    DOB: 03-10-1966  Age: 56 y.o. MRN: [id number]  CC: The primary encounter diagnosis was Hyperlipidemia associated with type 2 diabetes mellitus (HCC). Diagnoses of Essential hypertension, Type 2 diabetes mellitus with diabetic neuropathy, without long-term current use of insulin (HCC), Hypothyroidism due to acquired atrophy of thyroid, B12 deficiency, Numbness and tingling of both feet, Acquired left foot drop, Sciatica of left side associated with disorder of lumbar spine, Overweight, Chronic pain associated with significant psychosocial dysfunction, MDD (major depressive disorder), recurrent episode, moderate (HCC), Anxiety state, DVT, lower extremity, distal, acute, left (HCC), History of maternal deep vein thrombosis (DVT), and History of deep venous thrombosis (DVT) of distal vein of left lower extremity were also pertinent to this visit.   HPI [name] presents for follow up on multiple issues  Chief Complaint  Patient presents with   Follow-up    Medication refills   1) T2DM:  not checking sugars due to damaged glucometer.   Has been getting inconsistent readings  with deviations of 70 pts at any given time . He is tolerating  metformin  2) Chronic pain of lower back. Recently his lumbar spine was imaged with an MRI done  due to worsening pain and new onset numbness and tingling of feet,  seen by podiatry referred to orthopedics.   MRI was Ordered by Physiatry:  we  reviewed report today during the visit.  No change in from prior imaging  from 2020  which had no  spinal stenosis.    Last known B12 level was < 300 in 2018 and he has diabetes   3)  Depression:  abilify added by psych.  No change yet.  Has follow up this month.  No interest in anything,  feels lonely. .    Lab Results  Component Value Date   HGBA1C 6.8 (H) 09/30/2021     Lab Results  Component Value Date   VITAMINB12 279 09/30/2021        Outpatient  Medications Prior to Visit  Medication Sig Dispense Refill   acetaminophen (TYLENOL) 325 MG tablet Take 1-2 tablets (325-650 mg total) by mouth every 6 (six) hours as needed for mild pain (pain score 1-3 or temp > 100.5). 30 tablet 0   amLODipine (NORVASC) 5 MG tablet Take 1 tablet (5 mg total) by mouth daily. 90 tablet 1   ARIPiprazole (ABILIFY) 5 MG tablet Take 1/2 to one tab at bed time 30 tablet 1   blood glucose meter kit and supplies Dispense based on patient and insurance preference. Use to check blood sugars once daily. 1 each 0   buPROPion (WELLBUTRIN XL) 300 MG 24 hr tablet Take 1 tablet (300 mg total) by mouth daily. 90 tablet 0   busPIRone (BUSPAR) 15 MG tablet Take 1 tablet (15 mg total) by mouth 3 (three) times daily. 90 tablet 5   clonazePAM (KLONOPIN) 2 MG tablet Take 0.5 tablets (1 mg total) by mouth 2 (two) times daily as needed for anxiety. 30 tablet 1   dicyclomine (BENTYL) 20 MG tablet TAKE 1 TABLET BY MOUTH EVERY 6 HOURS 120 tablet 0   ezetimibe (ZETIA) 10 MG tablet Take 1 tablet (10 mg total) by mouth daily. 90 tablet 3   gabapentin (NEURONTIN) 600 MG tablet Take 1 tablet (600 mg total) by mouth 3 (three) times daily. 270 tablet 1   glucose blood test strip Use once daily to check BS at various times  E11.9 100 each 12   levothyroxine (SYNTHROID) 88 MCG tablet Take 1 tablet (88 mcg total) by mouth daily before breakfast. 90 tablet 1   losartan (COZAAR) 100 MG tablet Take 1 tablet (100 mg total) by mouth daily. 90 tablet 3   metFORMIN (GLUCOPHAGE) 500 MG tablet TAKE 1 TABLET BY MOUTH TWICE DAILY WITH A MEAL 180 tablet 0   methocarbamol (ROBAXIN) 500 MG tablet Take 1 tablet (500 mg total) by mouth every 8 (eight) hours as needed. 90 tablet 2   metoprolol tartrate (LOPRESSOR) 25 MG tablet Take 1 tablet (25 mg total) by mouth 2 (two) times daily. 180 tablet 1   Multiple Vitamin (MULTIVITAMIN WITH MINERALS) TABS tablet Take 1 tablet by mouth daily.     omeprazole (PRILOSEC) 40 MG  capsule TAKE 1 CAPSULE BY MOUTH IN THE MORNING AND AT BEDTIME 180 capsule 1   promethazine (PHENERGAN) 12.5 MG tablet Take 1 tablet (12.5 mg total) by mouth every 4 (four) hours as needed for nausea or vomiting. (Patient taking differently: Take 12.5 mg by mouth every 8 (eight) hours as needed for nausea or vomiting.) 20 tablet 0   traZODone (DESYREL) 100 MG tablet TAKE 1 & 1/2 (ONE & ONE-HALF) TABLETS BY MOUTH AT BEDTIME 135 tablet 2   atorvastatin (LIPITOR) 80 MG tablet Take 80 mg by mouth daily.     furosemide (LASIX) 20 MG tablet Take 1 tablet (20 mg total) by mouth every other day. 45 tablet 3   HYDROcodone-acetaminophen (NORCO) 10-325 MG tablet Take 1 tablet by mouth 2 (two) times daily as needed. 60 tablet 0   clobetasol (TEMOVATE) 0.05 % external solution Apply 1 application topically 2 (two) times daily as needed (scalp irritation). (Patient not taking: Reported on 06/06/2021)     Clobetasol Propionate 0.05 % shampoo Apply to dry scalp and leave on for 15 minutes (Patient not taking: Reported on 09/30/2021) 118 mL 5   XARELTO 20 MG TABS tablet TAKE 1 TABLET BY MOUTH ONCE DAILY WITH SUPPER (Patient not taking: Reported on 09/30/2021) 30 tablet 0   No facility-administered medications prior to visit.    Review of Systems;  Patient denies headache, fevers, malaise, unintentional weight loss, skin rash, eye pain, sinus congestion and sinus pain, sore throat, dysphagia,  hemoptysis , cough, dyspnea, wheezing, chest pain, palpitations, orthopnea, edema, abdominal pain, nausea, melena, diarrhea, constipation, flank pain, dysuria, hematuria, urinary  Frequency, nocturia, numbness, tingling, seizures,  Focal weakness, Loss of consciousness,  Tremor, insomnia, depression, anxiety, and suicidal ideation.      Objective:  BP 122/82 (BP Location: Left Arm, Patient Position: Sitting, Cuff Size: Normal)   Pulse 63   Temp 97.6 F (36.4 C) (Oral)   Ht 6' (1.829 m)   Wt 231 lb (104.8 kg)   SpO2 94%    BMI 31.33 kg/m   BP Readings from Last 3 Encounters:  09/30/21 122/82  06/06/21 (!) 150/92  03/04/21 122/74    Wt Readings from Last 3 Encounters:  09/30/21 231 lb (104.8 kg)  06/06/21 227 lb (103 kg)  03/04/21 218 lb 3.2 oz (99 kg)    General appearance: alert, cooperative and appears stated age Ears: normal TM's and external ear canals both ears Throat: lips, mucosa, and tongue normal; teeth and gums normal Neck: no adenopathy, no carotid bruit, supple, symmetrical, trachea midline and thyroid not enlarged, symmetric, no tenderness/mass/nodules Back: symmetric, no curvature. ROM normal. No CVA tenderness. Lungs: clear to auscultation bilaterally Heart: regular rate and rhythm, S1, S2  normal, no murmur, click, rub or gallop Abdomen: soft, non-tender; bowel sounds normal; no masses,  no organomegaly Pulses: 2+ and symmetric Skin: Skin color, texture, turgor normal. No rashes or lesions Lymph nodes: Cervical, supraclavicular, and axillary nodes normal.,   MSK:   absent patellar and achilles reflexes,  left foot dorsiflexion weaker than right   Lab Results  Component Value Date   HGBA1C 6.8 (H) 09/30/2021   HGBA1C 6.7 (H) 06/06/2021   HGBA1C 6.5 (H) 02/07/2021    Lab Results  Component Value Date   CREATININE 0.89 09/30/2021   CREATININE 0.93 06/06/2021   CREATININE 0.90 02/11/2021    Lab Results  Component Value Date   WBC 10.9 (H) 02/12/2021   HGB 11.9 (L) 02/12/2021   HCT 34.4 (L) 02/12/2021   PLT 189 02/12/2021   GLUCOSE 94 09/30/2021   CHOL 161 09/30/2021   TRIG 231.0 (H) 09/30/2021   HDL 35.50 (L) 09/30/2021   LDLDIRECT 116.0 09/30/2021   LDLCALC 52 06/06/2021   ALT 35 09/30/2021   AST 27 09/30/2021   NA 139 09/30/2021   K 4.0 09/30/2021   CL 100 09/30/2021   CREATININE 0.89 09/30/2021   BUN 10 09/30/2021   CO2 33 (H) 09/30/2021   TSH 2.04 09/30/2021   PSA 0.70 10/16/2017   INR 0.98 08/27/2015   HGBA1C 6.8 (H) 09/30/2021   MICROALBUR 4.2 (H)  06/06/2021    MR LUMBAR SPINE WO CONTRAST  Result Date: 09/29/2021 CLINICAL DATA:  Lumbar radiculopathy M54.16 (ICD-10-CM). Low back pain, symptoms persist with > 6 wks treatment. 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 EXAM: MRI LUMBAR SPINE WITHOUT CONTRAST TECHNIQUE: Multiplanar, multisequence MR imaging of the lumbar spine was performed. No intravenous contrast was administered. COMPARISON:  MRI of the lumbar spine December 05 2018. FINDINGS: Segmentation:  Standard. Alignment:  Trace retrolisthesis at L2-3 and L3-4. Vertebrae:  No fracture, evidence of discitis, or bone lesion. Conus medullaris and cauda equina: Conus extends to the L1-2 level. Conus and cauda equina appear normal. Paraspinal and other soft tissues: Negative. Disc levels: T12-L1: No spinal canal or neural foraminal stenosis. L1-2: No spinal canal or neural foraminal stenosis. L2-3: Shallow disc bulge and mild bilateral facet joint effusion. No significant spinal canal or neural foraminal stenosis. L3-4: Left subarticular to far lateral disc protrusion, moderate hypertrophic facet degenerative changes with trace bilateral joint effusion resulting in mild narrowing of the left subarticular zone and mild left neural foraminal narrowing, unchanged. L4-5: Disc bulge, moderate hypertrophic facet degenerative changes and mild ligamentum flavum redundancy resulting in mild-to-moderate right and mild left neural foraminal narrowing. No significant spinal canal stenosis. Findings have progressed since prior MRI. L5-S1: Shallow disc bulge and mild facet degenerative changes without significant spinal canal or neural foraminal stenosis, unchanged. IMPRESSION: 1. Progression of degenerative changes at L4-5 now with mild-to-moderate right and mild left neural. 2. Stable left subarticular to far lateral disc protrusion at L3-4 resulting in mild narrowing of the left subarticular zone and mild left neural foraminal narrowing.  Electronically Signed   By: Pedro Earls M.D.   On: 09/29/2021 17:20    Assessment & Plan:   Problem List Items Addressed This Visit     Type 2 diabetes mellitus with diabetic neuropathy, unspecified ([place])    Diabetes  Has been well controlled ; he is  tolerating metformin.   he has loss of sensation over  several areas of both feet from prior podiatry surgeries,  But his feet are  in excellent condition.  ASA stopped due to use of xarelto for DVT .  Tolerating  ARB  And statin at maximal dose (atorvastatin 80 mg )   Lab Results  Component Value Date   HGBA1C 6.8 (H) 09/30/2021   Lab Results  Component Value Date   MICROALBUR 4.2 (H) 06/06/2021   MICROALBUR <0.7 05/28/2020    Lab Results  Component Value Date   CHOL 161 09/30/2021   HDL 35.50 (L) 09/30/2021   LDLCALC 52 06/06/2021   LDLDIRECT 116.0 09/30/2021   TRIG 231.0 (H) 09/30/2021   CHOLHDL 5 09/30/2021         Relevant Medications   atorvastatin (LIPITOR) 80 MG tablet   Other Relevant Orders   Comp Met (CMET) (Completed)   HgB A1c (Completed)   Sciatica of left side associated with disorder of lumbar spine    MRI was repeated last week and noted the following:  "L4-5: Disc bulge, moderate hypertrophic facet degenerative changes and mild ligamentum flavum redundancy resulting in mild-to-moderate right and mild left neural foraminal narrowing. No significant spinal canal stenosis; however fFindings have progressed since prior MRI and he has developed weakness inf the left foot dorsiflexors.  Referral to Neurology in process to rule out myelopathy      Overweight    He is unable to exercise due to chronic pain involving both knees  Cervical spine, lumbar spine,  and shoulders       MDD (major depressive disorder), recurrent episode, moderate (HCC)    His symptoms are controlled on current regimen of Wellbutrin and recently increased dose of  Abilify.  Clonazepam trazodone and buspirone are managing  concurrent anxiety.  No changes today. encouraged continued follow up with psych and psychology.        Hypothyroidism due to acquired atrophy of thyroid   Relevant Orders   TSH (Completed)   Hyperlipidemia associated with type 2 diabetes mellitus ([place]) - Primary   Relevant Medications   furosemide (LASIX) 20 MG tablet (Start on 10/03/2021)   atorvastatin (LIPITOR) 80 MG tablet   Other Relevant Orders   Lipid Profile (Completed)   Direct LDL (Completed)   History of deep venous thrombosis (DVT) of distal vein of left lower extremity    Has has  Completed over 6 months of anticoagulation.  Repeat doppler needed prior to suspending Xarelto. Ordered       Relevant Orders   VAS [place] LOWER EXTREMITY VENOUS (DVT)   Essential hypertension   Relevant Medications   furosemide (LASIX) 20 MG tablet (Start on 10/03/2021)   atorvastatin (LIPITOR) 80 MG tablet   Other Relevant Orders   Comp Met (CMET) (Completed)   Chronic pain associated with significant psychosocial dysfunction    He requires management of pain involving both knees  Cervical spine and shoulders with hydrocodone, which was refilled today.   Refill history confirmed via [place] Controlled Substance databas, accessed by me today..      Relevant Medications   HYDROcodone-acetaminophen (NORCO) 10-325 MG tablet   HYDROcodone-acetaminophen (NORCO) 10-325 MG tablet   HYDROcodone-acetaminophen (NORCO) 10-325 MG tablet   Anxiety state    Aggravated by his wife Tanya's death,   his loss of employment and the consequent financial hardship. His anxiety is managed with scheduled clonazepam, buspirone And trazodone 150 mg qhs.  His has recently been awarded disability , which has alleviated the financial strain. Will encourage him to consider actively trying to reduce his dependance on  Clonazepam.  Naloxone prescription sent to  [facility] given concurrent use of hydrocodone       Other Visit Diagnoses     B12 deficiency       Relevant Orders    B12 and Folate Panel (Completed)   Methylmalonic Acid   Numbness and tingling of both feet       Relevant Orders   Ambulatory referral to Neurology   Acquired left foot drop       Relevant Orders   Ambulatory referral to Neurology   History of maternal deep vein thrombosis (DVT)           I spent a total of 32 minutes with this patient in a face to face visit on the date of this encounter reviewing the last office visit with me 3 months ago, his recent with his sports medicine doctor,  his diet  ary habits and alcohol use.  most recent imaging study ,   and post visit ordering of testing and therapeutics.    Follow-up: Return in about 3 months (around 12/31/2021).   [name], MD

## 2021-10-02 MED ORDER — HYDROCODONE-ACETAMINOPHEN 10-325 MG PO TABS
1.0000 | ORAL_TABLET | Freq: Two times a day (BID) | ORAL | 0 refills | Status: DC | PRN
Start: 2021-10-02 — End: 2021-12-14

## 2021-10-02 MED ORDER — HYDROCODONE-ACETAMINOPHEN 10-325 MG PO TABS
1.0000 | ORAL_TABLET | Freq: Two times a day (BID) | ORAL | 0 refills | Status: DC | PRN
Start: 2021-10-02 — End: 2022-01-16

## 2021-10-02 NOTE — Assessment & Plan Note (Addendum)
His symptoms are controlled on current regimen of Wellbutrin and recently increased dose of  Abilify.  Clonazepam trazodone and buspirone are managing concurrent anxiety.  No changes today. encouraged continued follow up with psych and psychology.

## 2021-10-02 NOTE — Assessment & Plan Note (Addendum)
Diabetes  Has been well controlled ; he is  tolerating metformin.   he has loss of sensation over  several areas of both feet from prior podiatry surgeries,  But his feet are in excellent condition.  ASA stopped due to use of xarelto for DVT .  Tolerating  ARB  And statin at maximal dose (atorvastatin 80 mg )   Lab Results  Component Value Date   HGBA1C 6.8 (H) 09/30/2021   Lab Results  Component Value Date   MICROALBUR 4.2 (H) 06/06/2021   MICROALBUR <0.7 05/28/2020    Lab Results  Component Value Date   CHOL 161 09/30/2021   HDL 35.50 (L) 09/30/2021   LDLCALC 52 06/06/2021   LDLDIRECT 116.0 09/30/2021   TRIG 231.0 (H) 09/30/2021   CHOLHDL 5 09/30/2021

## 2021-10-02 NOTE — Addendum Note (Signed)
Addended by: [name] on: 10/02/2021 08:52 PM   Modules accepted: Orders

## 2021-10-02 NOTE — Assessment & Plan Note (Addendum)
MRI was repeated last week and noted the following:  "L4-5: Disc bulge, moderate hypertrophic facet degenerative changes and mild ligamentum flavum redundancy resulting in mild-to-moderate right and mild left neural foraminal narrowing. No significant spinal canal stenosis; however fFindings have progressed since prior MRI and he has developed weakness inf the left foot dorsiflexors.  Referral to Neurology in process to rule out myelopathy

## 2021-10-02 NOTE — Assessment & Plan Note (Signed)
He requires management of pain involving both knees  Cervical spine and shoulders with hydrocodone, which was refilled today.   Refill history confirmed via [place] Controlled Substance databas, accessed by me today.Marland Kitchen

## 2021-10-02 NOTE — Assessment & Plan Note (Signed)
Has has  Completed over 6 months of anticoagulation.  Repeat doppler needed prior to suspending Xarelto. Ordered

## 2021-10-02 NOTE — Assessment & Plan Note (Signed)
Aggravated by his wife Tanya's death,   his loss of employment and the consequent financial hardship. His anxiety is managed with scheduled clonazepam, buspirone And trazodone 150 mg qhs.  His has recently been awarded disability , which has alleviated the financial strain. Will encourage him to consider actively trying to reduce his dependance on  Clonazepam.  Naloxone prescription sent to [facility] given concurrent use of hydrocodone

## 2021-10-02 NOTE — Assessment & Plan Note (Addendum)
He is unable to exercise due to chronic pain involving both knees  Cervical spine, lumbar spine,  and shoulders

## 2021-10-04 ENCOUNTER — Ambulatory Visit (INDEPENDENT_AMBULATORY_CARE_PROVIDER_SITE_OTHER): Payer: Medicare HMO | Admitting: Physical Medicine and Rehabilitation

## 2021-10-04 ENCOUNTER — Encounter: Payer: Self-pay | Admitting: Physical Medicine and Rehabilitation

## 2021-10-04 ENCOUNTER — Other Ambulatory Visit: Payer: Self-pay

## 2021-10-04 DIAGNOSIS — M4726 Other spondylosis with radiculopathy, lumbar region: Secondary | ICD-10-CM

## 2021-10-04 DIAGNOSIS — M5416 Radiculopathy, lumbar region: Secondary | ICD-10-CM

## 2021-10-04 DIAGNOSIS — G894 Chronic pain syndrome: Secondary | ICD-10-CM

## 2021-10-04 DIAGNOSIS — R202 Paresthesia of skin: Secondary | ICD-10-CM

## 2021-10-04 LAB — METHYLMALONIC ACID, SERUM: Methylmalonic Acid, Quant: 202 nmol/L (ref 87–318)

## 2021-10-04 MED ORDER — ONETOUCH ULTRA 2 W/DEVICE KIT: PACK | 0 refills | Status: DC

## 2021-10-04 MED ORDER — ACCU-CHEK AVIVA PLUS W/DEVICE KIT: PACK | 0 refills | Status: DC

## 2021-10-04 MED ORDER — ACCU-CHEK AVIVA PLUS VI STRP: ORAL_STRIP | 0 refills | Status: DC

## 2021-10-04 MED ORDER — ACCU-CHEK MULTICLIX LANCETS MISC: 0 refills | Status: AC

## 2021-10-04 NOTE — Progress Notes (Unsigned)
Pt here today for his Lumbar MRI review. Pt state lower back pain that travels down both legs and feet. Pt state standing and walking makes the pain worse. Pt state he takes over the counter pain meds to hep ease his pain.  Numeric Pain Rating Scale and Functional Assessment Average Pain 10 Pain Right Now 7 My pain is constant, sharp, burning, stabbing, tingling, and aching Pain is worse with: walking, standing, and some activites Pain improves with: medication   In the last MONTH (on 0-10 scale) has pain interfered with the following?  1. General activity like being  able to carry out your everyday physical activities such as walking, climbing stairs, carrying groceries, or moving a chair?  Rating(5)  2. Relation with others like being able to carry out your usual social activities and roles such as  activities at home, at work and in your community. Rating(6)  3. Enjoyment of life such that you have  been bothered by emotional problems such as feeling anxious, depressed or irritable?  Rating(7)

## 2021-10-04 NOTE — Progress Notes (Unsigned)
[name] - 56 y.o. male MRN [id number]  Date of birth: Jan 21, 1966  Office Visit Note: Visit Date: 10/04/2021 PCP: [name], MD Referred by: [name], MD  Subjective: Chief Complaint  Patient presents with   Lower Back - Pain   Right Leg - Pain   Left Leg - Pain   Left Foot - Pain   Right Foot - Pain   HPI: [name] is a 56 y.o. male who comes in today for evaluation of chronic, worsening and severe bilateral lower back pain radiating down legs to feet, left greater than right. Patient states pain has been ongoing for several years and is exacerbated by standing, walking, movement and activity. He describes pain as sore, aching and numbness/tingling sensation to bilateral legs/feet. Currently rates pain as 7 out of 10.  Patient reports some relief of pain with home exercise regimen, rest and use of medications. Patient also reports history formal physical therapy at [place], reports no relief of pain with these treatments. He continues to take Gabapentin and Norco that is prescribed by is primary care provider Dr. Deborra Medina. Patients recent lumbar MRI exhibits progression of degenerative changes at L4-L5 now with bilateral foraminal narrowing, there is also stable left subarticular to far lateral disc protrusion at L3-L4 leading to mild narrowing of left subarticular zone. No high grade spinal canal stenosis noted. Bilateral L3 transforaminal epidural steroid injection performed in our office in February of this year provided short term relief, lasting approximately 2 days. Patient continues to have issues with numbness/tingling to bilateral legs and feet, recently had appointment with PCP to discuss these issues. Patient denies focal weakness. Patient denies recent trauma or falls.    Review of Systems  Cardiovascular:  Positive for leg swelling.  Musculoskeletal:  Positive for back pain.  Neurological:  Positive for tingling. Negative for  sensory change, focal weakness and weakness.  All other systems reviewed and are negative.  Otherwise per HPI.  Assessment & Plan: Visit Diagnoses:    ICD-10-CM   1. Lumbar radiculopathy  M54.16     2. Other spondylosis with radiculopathy, lumbar region  M47.26     3. Chronic pain syndrome  G89.4     4. Paresthesia of skin  R20.2        Plan: Findings:  Chronic, worsening and severe bilateral lower back pain radiating down legs to feet, left greater than right. Patient continues to have severe pain despite good conservative therapies such as formal physical therapy, home exercise regimen, rest and use of medications. Patients recent lumbar MRI imaging does not directly correlate with symptoms. I did discuss lumbar MRI findings with patient today using images and spine model. I did reassure him that from a lumbar spine standpoint I did not see any concerning findings, no nerve compression, no spinal canal stenosis, nothing alarming that would explain his symptoms. We did discuss performing diagnostic and therapeutic bilateral L4 transforaminal epidural steroid injection, however patient would like to hold on injection at this time. If his symptoms persist post injection I do feel he would benefit from consult with neurologist. Patient encouraged to remain active. No red flag symptoms noted upon exam today.     Meds & Orders: No orders of the defined types were placed in this encounter.  No orders of the defined types were placed in this encounter.   Follow-up: Return if symptoms worsen or fail to improve.   Procedures: No procedures performed  Clinical History: EXAM: MRI LUMBAR SPINE WITHOUT CONTRAST   TECHNIQUE: Multiplanar, multisequence MR imaging of the lumbar spine was performed. No intravenous contrast was administered.   COMPARISON:  MRI of the lumbar spine December 05 2018.   FINDINGS: Segmentation:  Standard.   Alignment:  Trace retrolisthesis at L2-3 and L3-4.    Vertebrae:  No fracture, evidence of discitis, or bone lesion.   Conus medullaris and cauda equina: Conus extends to the L1-2 level. Conus and cauda equina appear normal.   Paraspinal and other soft tissues: Negative.   Disc levels:   T12-L1: No spinal canal or neural foraminal stenosis.   L1-2: No spinal canal or neural foraminal stenosis.   L2-3: Shallow disc bulge and mild bilateral facet joint effusion. No significant spinal canal or neural foraminal stenosis.   L3-4: Left subarticular to far lateral disc protrusion, moderate hypertrophic facet degenerative changes with trace bilateral joint effusion resulting in mild narrowing of the left subarticular zone and mild left neural foraminal narrowing, unchanged.   L4-5: Disc bulge, moderate hypertrophic facet degenerative changes and mild ligamentum flavum redundancy resulting in mild-to-moderate right and mild left neural foraminal narrowing. No significant spinal canal stenosis. Findings have progressed since prior MRI.   L5-S1: Shallow disc bulge and mild facet degenerative changes without significant spinal canal or neural foraminal stenosis, unchanged.   IMPRESSION: 1. Progression of degenerative changes at L4-5 now with mild-to-moderate right and mild left neural. 2. Stable left subarticular to far lateral disc protrusion at L3-4 resulting in mild narrowing of the left subarticular zone and mild left neural foraminal narrowing.     Electronically Signed   By: Pedro Earls M.D.   On: 09/29/2021 17:20   He reports that he quit smoking about 16 years ago. His smoking use included cigarettes. He has a 36.00 pack-year smoking history. He has never used smokeless tobacco.  Recent Labs    02/07/21 1016 06/06/21 1032 09/30/21 1048  HGBA1C 6.5* 6.7* 6.8*    Objective:  VS:  HT:    WT:   BMI:     BP:   HR: bpm  TEMP: ( )  RESP:  Physical Exam Vitals and nursing note reviewed.  HENT:      Head: Normocephalic and atraumatic.     Right Ear: External ear normal.     Left Ear: External ear normal.     Nose: Nose normal.     Mouth/Throat:     Mouth: Mucous membranes are moist.  Eyes:     Extraocular Movements: Extraocular movements intact.  Cardiovascular:     Rate and Rhythm: Normal rate.     Pulses: Normal pulses.  Pulmonary:     Effort: Pulmonary effort is normal.  Abdominal:     General: Abdomen is flat. There is no distension.  Musculoskeletal:        General: Tenderness present.     Cervical back: Normal range of motion.     Right lower leg: 2+ Edema present.     Left lower leg: 2+ Edema present.     Comments: Pt rises from seated position to standing without difficulty. Good lumbar range of motion. Strong distal strength without clonus, no pain upon palpation of greater trochanters. Sensation intact bilaterally. Walks independently, gait steady.   Skin:    General: Skin is warm and dry.     Capillary Refill: Capillary refill takes less than 2 seconds.  Neurological:     General: No focal deficit present.  Mental Status: He is alert and oriented to person, place, and time.  Psychiatric:        Mood and Affect: Mood normal.        Behavior: Behavior normal.     Ortho Exam  Imaging: No results found.  Past Medical/Family/Surgical/Social History: Medications & Allergies reviewed per EMR, new medications updated. Patient Active Problem List   Diagnosis Date Noted   Chronic pain associated with significant psychosocial dysfunction 03/05/2021   History of deep venous thrombosis (DVT) of distal vein of left lower extremity 03/05/2021   Arthritis of left knee    Osteoarthritis 02/10/2021   S/P TKR (total knee replacement), left 02/10/2021   Grief counseling 11/14/2020   Abnormal electrocardiogram (ECG) (EKG) 09/02/2020   Preoperative evaluation to rule out surgical contraindication 09/02/2020   MDD (major depressive disorder), recurrent episode, moderate  (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   Spinal stenosis of cervical region 12/31/2018   Loose body of left knee    Plantar fasciitis of left foot 08/24/2017   Chronic pain of both hips 06/20/2017   Complete rotator cuff tear of left shoulder 09/14/2015   Hypothyroidism due to acquired atrophy of thyroid 09/14/2015   Arthritis of knee 06/23/2014   Encounter for preventive health examination 06/17/2014   Enlarged RV (right ventricle) 06/17/2014   GERD (gastroesophageal reflux disease) 04/20/2014   Cervical spine degeneration 02/17/2014   Overweight 10/29/2013   Chronic knee pain 10/29/2013   Type 2 diabetes mellitus with diabetic neuropathy, unspecified ([place]) 10/23/2012   Seborrheic dermatitis, unspecified 05/17/2012   Fatty liver 04/22/2012   Hypogonadism male 03/12/2012   Chronic pain of multiple joints 12/24/2010   Sciatica of left side associated with disorder of lumbar spine 12/24/2010   Hyperlipidemia associated with type 2 diabetes mellitus ([place]) 01/13/2009   Anxiety state 01/13/2009   Essential hypertension 01/13/2009   OSA (obstructive sleep apnea) 01/13/2009   Past Medical History:  Diagnosis Date   Acute medial meniscus tear of left knee    Alcohol-induced chronic pancreatitis ([place]) 11/20/2016   Anxiety    Anxiety and depression    Arthritis    knees and shoulders   Benzodiazepine withdrawal with complication ([place]) 69/62/9528   Bruit    L   Chest pain    hx   Cognitive complaints with normal neuropsychological exam 10/23/2016   Diabetes mellitus without complication ([place])    "borderline", diet controlled, no meds, patient has lost 30 lbs    Edema    Fatty liver    GERD (gastroesophageal reflux disease)    uses Omeprazole   Goiter    HLD (hyperlipidemia)    Hypertension    essential, benign   Hypothyroidism    Impotence of organic origin    Murmur    never has caused any problems   Neuromuscular disorder ([place])    Other chest pain    tightness, pressure   Palpitation    hx   Precordial pain    Sleep apnea    uses cpap   Family History  Problem Relation Age of Onset   Heart disease Father    Colon cancer Neg Hx    Colon polyps Neg  Hx    Esophageal cancer Neg Hx    Rectal cancer Neg Hx    Stomach cancer Neg Hx    Past Surgical History:  Procedure Laterality Date   APPENDECTOMY  2002   done at [facility] N/A 08/27/2015   Procedure: Left Heart Cath and Coronary Angiography;  Surgeon: Jolaine Artist, MD;  Location: [facility];  Service: Cardiovascular;  Laterality: N/A;   COLONOSCOPY  in his 20's   EAR CYST EXCISION Left 07/08/2019   Procedure: open excision baker's cyst left knee;  Surgeon: Meredith Pel, MD;  Location: [facility];  Service: Orthopedics;  Laterality: Left;   HERNIA REPAIR  1-70-01   umbilical   JOINT REPLACEMENT     right knee   KNEE ARTHROSCOPY Left 12/17/2018   Procedure: left knee arthroscopy, meniscal debridement, loose body removal;  Surgeon: Meredith Pel, MD;  Location: [facility];  Service: Orthopedics;  Laterality: Left;   REVISION TOTAL KNEE ARTHROPLASTY Right 06/23/2014   DR DEAN   TONSILLECTOMY     TOTAL KNEE ARTHROPLASTY Right 2011   right   TOTAL KNEE ARTHROPLASTY Left 02/10/2021   Procedure: LEFT TOTAL KNEE ARTHROPLASTY;  Surgeon: Meredith Pel, MD;  Location: [facility];  Service: Orthopedics;  Laterality: Left;   TOTAL KNEE REVISION Right 06/23/2014   Procedure: TOTAL KNEE REVISION;  Surgeon: Meredith Pel, MD;  Location: [facility];  Service: Orthopedics;  Laterality: Right;   UPPER GASTROINTESTINAL ENDOSCOPY   04/30/2013   Judieth Keens TOOTH EXTRACTION     Social History   Occupational History   Occupation: Personnel officer: Public relations account executive   Occupation: SERVICE DIRECTOR    Employer: [employer]  Tobacco Use   Smoking status: Former    Packs/day: 2.00    Years: 18.00    Total pack years: 36.00    Types: Cigarettes    Quit date: 04/24/2005    Years since quitting: 16.4   Smokeless tobacco: Never   Tobacco comments:    quit 20 years ago   Vaping Use   Vaping Use: Never used  Substance and Sexual Activity   Alcohol use: Not Currently    Alcohol/week: 4.0 standard drinks of alcohol    Types: 4 Cans of beer per week   Drug use: No   Sexual activity: Yes

## 2021-10-05 ENCOUNTER — Ambulatory Visit (INDEPENDENT_AMBULATORY_CARE_PROVIDER_SITE_OTHER): Payer: Medicare HMO | Admitting: *Deleted

## 2021-10-05 ENCOUNTER — Other Ambulatory Visit: Payer: Self-pay | Admitting: Internal Medicine

## 2021-10-05 ENCOUNTER — Telehealth: Payer: Self-pay | Admitting: Internal Medicine

## 2021-10-05 DIAGNOSIS — D510 Vitamin B12 deficiency anemia due to intrinsic factor deficiency: Secondary | ICD-10-CM

## 2021-10-05 DIAGNOSIS — E538 Deficiency of other specified B group vitamins: Secondary | ICD-10-CM

## 2021-10-05 MED ORDER — CYANOCOBALAMIN 1000 MCG/ML IJ SOLN
1000.0000 ug | Freq: Once | INTRAMUSCULAR | Status: AC
  Administered 2021-10-05: 1000 ug via INTRAMUSCULAR

## 2021-10-05 MED ORDER — CYANOCOBALAMIN 1000 MCG/ML IJ SOLN: 1000.0000 ug | Freq: Once | INTRAMUSCULAR | 0 refills | Status: DC

## 2021-10-05 NOTE — Telephone Encounter (Signed)
NEEDS INTRINSIC FACTOR ANTIBODY CHECKED AT NEXT RN VISIT FOR B12 INJECTIONS  NOT SURE WHY IT WASN'T DRAWN WITH THE OTHER LABS ON JUNE 9

## 2021-10-05 NOTE — Addendum Note (Signed)
Addended by: Cheri Rous E on: 10/05/2021 04:58 PM   Modules accepted: Orders

## 2021-10-05 NOTE — Telephone Encounter (Signed)
CMA giving injection today knows to take pt to lab for intrinsic factor.

## 2021-10-05 NOTE — Progress Notes (Signed)
Patient presented for B 12 injection to right deltoid, patient voiced no concerns nor showed any signs of distress during injection. 

## 2021-10-10 LAB — INTRINSIC FACTOR ANTIBODIES: Intrinsic Factor: POSITIVE — AB

## 2021-10-12 ENCOUNTER — Ambulatory Visit (INDEPENDENT_AMBULATORY_CARE_PROVIDER_SITE_OTHER): Payer: Medicare HMO

## 2021-10-12 DIAGNOSIS — D510 Vitamin B12 deficiency anemia due to intrinsic factor deficiency: Secondary | ICD-10-CM | POA: Insufficient documentation

## 2021-10-12 DIAGNOSIS — E538 Deficiency of other specified B group vitamins: Secondary | ICD-10-CM | POA: Diagnosis not present

## 2021-10-12 MED ORDER — CYANOCOBALAMIN 1000 MCG/ML IJ SOLN
1000.0000 ug | Freq: Once | INTRAMUSCULAR | Status: AC
  Administered 2021-10-12: 1000 ug via INTRAMUSCULAR

## 2021-10-12 NOTE — Assessment & Plan Note (Signed)
Continue b12  injections for life

## 2021-10-12 NOTE — Progress Notes (Cosign Needed)
Patient presented for B 12 injection to left deltoid, patient voiced no concerns nor showed any signs of distress during injection. 

## 2021-10-14 ENCOUNTER — Telehealth (HOSPITAL_BASED_OUTPATIENT_CLINIC_OR_DEPARTMENT_OTHER): Payer: Medicare HMO | Admitting: Psychiatry

## 2021-10-14 ENCOUNTER — Encounter (HOSPITAL_COMMUNITY): Payer: Self-pay | Admitting: Psychiatry

## 2021-10-14 VITALS — Wt 231.0 lb

## 2021-10-14 DIAGNOSIS — F4321 Adjustment disorder with depressed mood: Secondary | ICD-10-CM | POA: Diagnosis not present

## 2021-10-14 DIAGNOSIS — F419 Anxiety disorder, unspecified: Secondary | ICD-10-CM

## 2021-10-14 DIAGNOSIS — F331 Major depressive disorder, recurrent, moderate: Secondary | ICD-10-CM | POA: Diagnosis not present

## 2021-10-14 MED ORDER — BUPROPION HCL ER (XL) 300 MG PO TB24: 300.0000 mg | ORAL_TABLET | Freq: Every day | ORAL | 0 refills | Status: DC

## 2021-10-14 MED ORDER — ARIPIPRAZOLE 5 MG PO TABS: ORAL_TABLET | ORAL | 0 refills | Status: DC

## 2021-10-19 ENCOUNTER — Ambulatory Visit (INDEPENDENT_AMBULATORY_CARE_PROVIDER_SITE_OTHER): Payer: Medicare HMO

## 2021-10-19 DIAGNOSIS — E538 Deficiency of other specified B group vitamins: Secondary | ICD-10-CM | POA: Diagnosis not present

## 2021-10-19 MED ORDER — CYANOCOBALAMIN 1000 MCG/ML IJ SOLN
1000.0000 ug | Freq: Once | INTRAMUSCULAR | Status: AC
  Administered 2021-10-19: 1000 ug via INTRAMUSCULAR

## 2021-10-19 NOTE — Progress Notes (Addendum)
Patient presented for B 12 injection to right deltoid, patient voiced no concerns nor showed any signs of distress during injection. 

## 2021-10-26 ENCOUNTER — Ambulatory Visit: Payer: Medicare HMO

## 2021-10-27 ENCOUNTER — Ambulatory Visit (INDEPENDENT_AMBULATORY_CARE_PROVIDER_SITE_OTHER): Payer: Medicare HMO

## 2021-10-27 DIAGNOSIS — E538 Deficiency of other specified B group vitamins: Secondary | ICD-10-CM

## 2021-10-27 MED ORDER — CYANOCOBALAMIN 1000 MCG/ML IJ SOLN
1000.0000 ug | Freq: Once | INTRAMUSCULAR | Status: AC
  Administered 2021-10-27: 1000 ug via INTRAMUSCULAR

## 2021-10-27 NOTE — Progress Notes (Signed)
[name] presents today for injection per MD orders. B12 injection administered IM in left Upper Arm. Administration without incident. Patient tolerated well. Katrinna Travieso,cma

## 2021-10-30 ENCOUNTER — Other Ambulatory Visit: Payer: Self-pay | Admitting: Internal Medicine

## 2021-11-08 ENCOUNTER — Other Ambulatory Visit: Payer: Self-pay

## 2021-11-08 ENCOUNTER — Encounter: Payer: Self-pay | Admitting: Internal Medicine

## 2021-11-08 MED ORDER — CLONAZEPAM 2 MG PO TABS
1.0000 mg | ORAL_TABLET | Freq: Two times a day (BID) | ORAL | 2 refills | Status: DC | PRN
Start: 2021-11-08 — End: 2022-01-16

## 2021-11-28 ENCOUNTER — Encounter: Payer: Self-pay | Admitting: Internal Medicine

## 2021-11-29 ENCOUNTER — Ambulatory Visit (INDEPENDENT_AMBULATORY_CARE_PROVIDER_SITE_OTHER): Payer: Medicare HMO

## 2021-11-29 DIAGNOSIS — E538 Deficiency of other specified B group vitamins: Secondary | ICD-10-CM

## 2021-11-29 MED ORDER — HYDROCODONE-ACETAMINOPHEN 10-325 MG PO TABS: 1.0000 | ORAL_TABLET | Freq: Two times a day (BID) | ORAL | 0 refills | Status: DC | PRN

## 2021-11-29 MED ORDER — CYANOCOBALAMIN 1000 MCG/ML IJ SOLN
1000.0000 ug | Freq: Once | INTRAMUSCULAR | Status: AC
  Administered 2021-11-29: 1000 ug via INTRAMUSCULAR

## 2021-11-29 NOTE — Telephone Encounter (Signed)
Refilled: 10/02/2021 Last OV: 09/30/2021 Next OV: not scheduled

## 2021-11-29 NOTE — Progress Notes (Signed)
Pt arrived for B12 injection, given in R deltoid. Pt tolerated injection well, showed no signs of distress nor voiced any concerns.  

## 2021-12-01 ENCOUNTER — Other Ambulatory Visit: Payer: Self-pay | Admitting: Internal Medicine

## 2021-12-01 NOTE — Progress Notes (Signed)
HPI: FU CAD. Previously followed by Dr Haroldine Laws. Cath 5/17 showed 30 Lcx. Cardiac CTA 10/20 showed Ca score 50, mild (<50) D1, mild (<50) Lcx and minimal RCA. Echocardiogram June 2022 showed normal LV function, mild right atrial enlargement.  Venous Dopplers November 2022 showed acute DVT on the left.  Since last seen, he occasionally has some dyspnea on exertion which is unchanged by his report.  No orthopnea, PND, exertional chest pain or syncope.  He has noticed bilateral lower extremity edema in the past 3 to 4 months.  Note amlodipine was initiated 6 months ago by his report.  Current Outpatient Medications  Medication Sig Dispense Refill   acetaminophen (TYLENOL) 325 MG tablet Take 1-2 tablets (325-650 mg total) by mouth every 6 (six) hours as needed for mild pain (pain score 1-3 or temp > 100.5). 30 tablet 0   amLODipine (NORVASC) 5 MG tablet Take 1 tablet by mouth once daily 90 tablet 0   ARIPiprazole (ABILIFY) 5 MG tablet Take one tab at bed time 90 tablet 0   atorvastatin (LIPITOR) 80 MG tablet Take 1 tablet (80 mg total) by mouth daily. 180 tablet 3   Blood Glucose Monitoring Suppl (ACCU-CHEK AVIVA PLUS) w/Device KIT Use to check blood sugars up to 4 times daily. ICD-10 E11.40 1 kit 0   buPROPion (WELLBUTRIN XL) 300 MG 24 hr tablet Take 1 tablet (300 mg total) by mouth daily. 90 tablet 0   busPIRone (BUSPAR) 15 MG tablet Take 1 tablet (15 mg total) by mouth 3 (three) times daily. 90 tablet 5   celecoxib (CELEBREX) 200 MG capsule Take 200 mg by mouth daily.     clobetasol (TEMOVATE) 0.05 % external solution Apply 1 application  topically 2 (two) times daily as needed (scalp irritation).     Clobetasol Propionate 0.05 % shampoo Apply to dry scalp and leave on for 15 minutes 118 mL 5   clonazePAM (KLONOPIN) 2 MG tablet Take 0.5 tablets (1 mg total) by mouth 2 (two) times daily as needed for anxiety. 30 tablet 2   dicyclomine (BENTYL) 20 MG tablet Take 1 tablet (20 mg total) by mouth  every 6 (six) hours. 120 tablet 0   ezetimibe (ZETIA) 10 MG tablet Take 1 tablet (10 mg total) by mouth daily. 90 tablet 3   furosemide (LASIX) 20 MG tablet Take 1 tablet (20 mg total) by mouth 2 (two) times a week. 4520 tablet 3   gabapentin (NEURONTIN) 600 MG tablet TAKE 1 TABLET BY MOUTH THREE TIMES DAILY 270 tablet 0   glucose blood (ACCU-CHEK AVIVA PLUS) test strip Use to check blood sugars up to 4 times daily. ICD-10 E11.40 400 each 0   glucose blood test strip Use once daily to check BS at various times  E11.9 100 each 12   HYDROcodone-acetaminophen (NORCO) 10-325 MG tablet Take 1 tablet by mouth 2 (two) times daily as needed. 60 tablet 0   HYDROcodone-acetaminophen (NORCO) 10-325 MG tablet Take 1 tablet by mouth 2 (two) times daily as needed. 60 tablet 0   Lancets (ACCU-CHEK MULTICLIX) lancets Use to to check blood sugars up to 4 times daily. ICD-10: E11.40 400 each 0   levothyroxine (SYNTHROID) 88 MCG tablet TAKE 1 TABLET BY MOUTH ONCE DAILY BEFORE BREAKFAST 90 tablet 1   losartan (COZAAR) 100 MG tablet Take 1 tablet (100 mg total) by mouth daily. 90 tablet 3   metFORMIN (GLUCOPHAGE) 500 MG tablet TAKE 1 TABLET BY MOUTH TWICE DAILY WITH  A MEAL 180 tablet 0   methocarbamol (ROBAXIN) 500 MG tablet Take 1 tablet (500 mg total) by mouth every 8 (eight) hours as needed. 90 tablet 2   metoprolol tartrate (LOPRESSOR) 25 MG tablet Take 1 tablet by mouth twice daily 180 tablet 0   Multiple Vitamin (MULTIVITAMIN WITH MINERALS) TABS tablet Take 1 tablet by mouth daily.     naloxone (NARCAN) nasal spray 4 mg/0.1 mL Use as needed for excessive sedation /opioid overdose 1 each 1   omeprazole (PRILOSEC) 40 MG capsule TAKE 1 CAPSULE BY MOUTH IN THE MORNING AND AT BEDTIME 180 capsule 0   promethazine (PHENERGAN) 12.5 MG tablet Take 1 tablet (12.5 mg total) by mouth every 4 (four) hours as needed for nausea or vomiting. (Patient taking differently: Take 12.5 mg by mouth every 8 (eight) hours as needed for  nausea or vomiting.) 20 tablet 0   rivaroxaban (XARELTO) 20 MG TABS tablet Take 1 tablet (20 mg total) by mouth daily with supper. 30 tablet 0   traZODone (DESYREL) 100 MG tablet TAKE 1 & 1/2 (ONE & ONE-HALF) TABLETS BY MOUTH AT BEDTIME 135 tablet 2   No current facility-administered medications for this visit.     Past Medical History:  Diagnosis Date   Acute medial meniscus tear of left knee    Alcohol-induced chronic pancreatitis ([place]) 11/20/2016   Anxiety    Anxiety and depression    Arthritis    knees and shoulders   Benzodiazepine withdrawal with complication ([place]) 26/37/8588   Bruit    L   Chest pain    hx   Cognitive complaints with normal neuropsychological exam 10/23/2016   Diabetes mellitus without complication ([facility])    "borderline", diet controlled, no meds, patient has lost 30 lbs   Edema    Fatty liver    GERD (gastroesophageal reflux disease)    uses Omeprazole   Goiter    HLD (hyperlipidemia)    Hypertension    essential, benign   Hypothyroidism    Impotence of organic origin    Murmur    never has caused any problems   Neuromuscular disorder ([place])    Other chest pain    tightness, pressure   Palpitation    hx   Precordial pain    Sleep apnea    uses cpap    Past Surgical History:  Procedure Laterality Date   APPENDECTOMY  2002   done at [facility] N/A 08/27/2015   Procedure: Left Heart Cath and Coronary Angiography;  Surgeon: Jolaine Artist, MD;  Location: [facility];  Service: Cardiovascular;  Laterality: N/A;   COLONOSCOPY  in his 20's   EAR CYST EXCISION Left 07/08/2019   Procedure: open excision baker's cyst left knee;  Surgeon: Meredith Pel, MD;  Location: [facility];  Service: Orthopedics;  Laterality: Left;   HERNIA REPAIR  08-24-75   umbilical   JOINT REPLACEMENT     right knee   KNEE ARTHROSCOPY Left 12/17/2018   Procedure: left knee arthroscopy, meniscal debridement, loose  body removal;  Surgeon: Meredith Pel, MD;  Location: [facility];  Service: Orthopedics;  Laterality: Left;   REVISION TOTAL KNEE ARTHROPLASTY Right 06/23/2014   DR DEAN   TONSILLECTOMY     TOTAL KNEE ARTHROPLASTY Right 2011   right   TOTAL KNEE ARTHROPLASTY Left 02/10/2021   Procedure: LEFT TOTAL KNEE ARTHROPLASTY;  Surgeon: Meredith Pel, MD;  [facility];  Service: Orthopedics;  Laterality: Left;   TOTAL KNEE REVISION Right 06/23/2014   Procedure: TOTAL KNEE REVISION;  Surgeon: Meredith Pel, MD;  Location: [facility];  Service: Orthopedics;  Laterality: Right;   UPPER GASTROINTESTINAL ENDOSCOPY  04/30/2013   Judieth Keens TOOTH EXTRACTION      Social History   Socioeconomic History   Marital status: Widowed    Spouse name: Not on file   Number of children: 2   Years of education: Not on file   Highest education level: Not on file  Occupational History   Occupation: Maintenance supervisor    Employer: [employer]   Occupation: SERVICE DIRECTOR    Employer: [employer]  Tobacco Use   Smoking status: Former    Packs/day: 2.00    Years: 18.00    Total pack years: 36.00    Types: Cigarettes    Quit date: 04/24/2005    Years since quitting: 16.6   Smokeless tobacco: Never   Tobacco comments:    quit 20 years ago   Vaping Use   Vaping Use: Never used  Substance and Sexual Activity   Alcohol use: Not Currently    Alcohol/week: 4.0 standard drinks of alcohol    Types: 4 Cans of beer per week   Drug use: No   Sexual activity: Yes  Other Topics Concern   Not on file  Social History Narrative   Married, gets regular exercise.    Social Determinants of Health   Financial Resource Strain: Low Risk  (04/08/2021)   Overall Financial Resource Strain (CARDIA)    Difficulty of Paying Living Expenses: Not very hard  Recent Concern: Financial Resource Strain - Medium Risk (03/15/2021)   Overall Financial Resource Strain (CARDIA)     Difficulty of Paying Living Expenses: Somewhat hard  Food Insecurity: Not on file  Transportation Needs: Not on file  Physical Activity: Not on file  Stress: Not on file  Social Connections: Not on file  Intimate Partner Violence: Not on file    Family History  Problem Relation Age of Onset   Heart disease Father    Colon cancer Neg Hx    Colon polyps Neg Hx    Esophageal cancer Neg Hx    Rectal cancer Neg Hx    Stomach cancer Neg Hx     ROS: no fevers or chills, productive cough, hemoptysis, dysphasia, odynophagia, melena, hematochezia, dysuria, hematuria, rash, seizure activity, orthopnea, PND, pedal edema, claudication. Remaining systems are negative.  Physical Exam: Well-developed well-nourished in no acute distress.  Skin is warm and dry.  HEENT is normal.  Neck is supple.  Chest is clear to auscultation with normal expansion.  Cardiovascular exam is regular rate and rhythm.  Abdominal exam nontender or distended. No masses palpated. Extremities show no edema. neuro grossly intact  ECG-normal sinus rhythm, no ST changes.  Personally reviewed  A/P  1 coronary artery disease-patient denies chest pain.  Continue aspirin and statin.  2 hypertension-patient's blood pressure is mildly elevated.  He is also describing bilateral lower extremity edema which temporally correlates with initiation of amlodipine.  LV function was normal previously.  I will discontinue amlodipine to see if his lower extremity edema improves.  I will also discontinue metoprolol and instead treat with carvedilol 12.5 mg twice daily as it is a more potent antihypertensive.  Follow blood pressure and adjust medications as needed.  3 hyperlipidemia-continue Lipitor.  Check lipids and liver.  4 obstructive sleep  apnea-continue CPAP.  Kirk Ruths, MD

## 2021-12-02 MED ORDER — DICYCLOMINE HCL 20 MG PO TABS: 20.0000 mg | ORAL_TABLET | Freq: Four times a day (QID) | ORAL | 0 refills | Status: DC

## 2021-12-05 ENCOUNTER — Other Ambulatory Visit: Payer: Self-pay | Admitting: Internal Medicine

## 2021-12-06 MED ORDER — RIVAROXABAN 20 MG PO TABS: 20.0000 mg | ORAL_TABLET | Freq: Every day | ORAL | 0 refills | Status: DC

## 2021-12-12 ENCOUNTER — Telehealth: Payer: Self-pay

## 2021-12-12 NOTE — Telephone Encounter (Signed)
Patient states he forgot to schedule his 24-monthfollow-up with Dr. TDeborra Medinafor his medication refill.  Patient has been scheduled to see Dr. TDerrel Nipon 12/23/2021 at 1:00pm.  Patient states he usually has labs drawn as part of his appointment.  I let patient know that we do not currently have lab orders in system, so I will send a note to Dr. TDerrel Nip

## 2021-12-13 ENCOUNTER — Other Ambulatory Visit: Payer: Self-pay | Admitting: Internal Medicine

## 2021-12-13 NOTE — Telephone Encounter (Signed)
Historical medication Last OV: 09/30/2021 Next OV: 01/04/2022  Request is for Celebrex but pt is also on Xarelto. Is it okay to refill?

## 2021-12-14 ENCOUNTER — Encounter: Payer: Self-pay | Admitting: Cardiology

## 2021-12-14 ENCOUNTER — Ambulatory Visit: Payer: Medicare HMO | Admitting: Cardiology

## 2021-12-14 VITALS — BP 142/78 | HR 84 | Ht 72.0 in | Wt 236.0 lb

## 2021-12-14 DIAGNOSIS — I10 Essential (primary) hypertension: Secondary | ICD-10-CM | POA: Diagnosis not present

## 2021-12-14 DIAGNOSIS — I2510 Atherosclerotic heart disease of native coronary artery without angina pectoris: Secondary | ICD-10-CM

## 2021-12-14 DIAGNOSIS — E785 Hyperlipidemia, unspecified: Secondary | ICD-10-CM | POA: Diagnosis not present

## 2021-12-14 DIAGNOSIS — E1169 Type 2 diabetes mellitus with other specified complication: Secondary | ICD-10-CM | POA: Diagnosis not present

## 2021-12-14 MED ORDER — CARVEDILOL 12.5 MG PO TABS: 12.5000 mg | ORAL_TABLET | Freq: Two times a day (BID) | ORAL | 3 refills | Status: DC

## 2021-12-14 NOTE — Telephone Encounter (Signed)
Pt is aware that celebrex can not be refilled. Pt gave a verbal understanding.

## 2021-12-14 NOTE — Patient Instructions (Signed)
Medication Instructions:   STOP AMLODIPINE  STOP METOPROLOL  START CARVEDILOL 12.5 MG ONE TABLET TWICE DAILY  *If you need a refill on your cardiac medications before your next appointment, please call your pharmacy*   Lab Work:  Your physician recommends that you return for lab work FASTING  If you have labs (blood work) drawn today and your tests are completely normal, you will receive your results only by: [place] (if you have MyChart) OR A paper copy in the mail If you have any lab test that is abnormal or we need to change your treatment, we will call you to review the results.   Follow-Up: At [facility], you and your health needs are our priority.  As part of our continuing mission to provide you with exceptional heart care, we have created designated Provider Care Teams.  These Care Teams include your primary Cardiologist (physician) and Advanced Practice Providers (APPs -  Physician Assistants and Nurse Practitioners) who all work together to provide you with the care you need, when you need it.  We recommend signing up for the patient portal called "MyChart".  Sign up information is provided on this After Visit Summary.  MyChart is used to connect with patients for Virtual Visits (Telemedicine).  Patients are able to view lab/test results, encounter notes, upcoming appointments, etc.  Non-urgent messages can be sent to your provider as well.   To learn more about what you can do with MyChart, go to [email or web address].    Your next appointment:   12 month(s)  The format for your next appointment:   In Person  Provider:   Kirk Ruths, MD

## 2021-12-21 ENCOUNTER — Other Ambulatory Visit: Payer: Self-pay

## 2021-12-21 NOTE — Patient Outreach (Signed)
[facility]) Care Management  12/21/2021  ASBURY HAIR [birth year] [id number]   Telephone Screen    Outreach call to patient to introduce [facility] services and assess care needs as part of benefit of PCP office and insurance plan.Spoke with patient. He denies any RN CM needs or concerns at this time. He was instructed that he could contact [facility] at any time if needs or concerns arise.    Main healthcare issue/concern today: None reported. Patient is independent with ADLs/IADLS. Drives himself to medical appts. No issues with affording and/or managing meds. He states Diabetes is controlled a present.    Health Maintenance/Care Gaps Addressed & Education Provided:   -Last AWV: None on file-encouraged to make an appt        Enzo Montgomery, RN,BSN,CCM [place] [facility] Telephonic Care Management Coordinator Direct Phone: [phone] Toll Free: [phone] Fax: [phone]

## 2021-12-23 ENCOUNTER — Ambulatory Visit: Payer: Medicare HMO | Admitting: Internal Medicine

## 2021-12-30 ENCOUNTER — Ambulatory Visit (INDEPENDENT_AMBULATORY_CARE_PROVIDER_SITE_OTHER): Payer: Medicare HMO | Admitting: *Deleted

## 2021-12-30 DIAGNOSIS — E538 Deficiency of other specified B group vitamins: Secondary | ICD-10-CM | POA: Diagnosis not present

## 2021-12-30 MED ORDER — CYANOCOBALAMIN 1000 MCG/ML IJ SOLN
1000.0000 ug | Freq: Once | INTRAMUSCULAR | Status: AC
  Administered 2021-12-30: 1000 ug via INTRAMUSCULAR

## 2021-12-30 NOTE — Progress Notes (Signed)
Pt received B12 injection in left deltoid. Pt tolerated it well with no complaints or concerns. 

## 2022-01-02 ENCOUNTER — Other Ambulatory Visit: Payer: Medicare HMO

## 2022-01-04 ENCOUNTER — Ambulatory Visit: Payer: Medicare HMO | Admitting: Internal Medicine

## 2022-01-05 ENCOUNTER — Telehealth: Payer: Self-pay | Admitting: Internal Medicine

## 2022-01-05 DIAGNOSIS — I10 Essential (primary) hypertension: Secondary | ICD-10-CM

## 2022-01-05 DIAGNOSIS — E785 Hyperlipidemia, unspecified: Secondary | ICD-10-CM

## 2022-01-05 DIAGNOSIS — E1140 Type 2 diabetes mellitus with diabetic neuropathy, unspecified: Secondary | ICD-10-CM

## 2022-01-05 NOTE — Telephone Encounter (Signed)
Labs have been ordered

## 2022-01-05 NOTE — Telephone Encounter (Signed)
Patient has a lab appt 01/11/22, there are no orders in.

## 2022-01-05 NOTE — Addendum Note (Signed)
Addended by: Adair Laundry on: 01/05/2022 04:19 PM   Modules accepted: Orders

## 2022-01-06 ENCOUNTER — Telehealth (HOSPITAL_BASED_OUTPATIENT_CLINIC_OR_DEPARTMENT_OTHER): Payer: Medicare HMO | Admitting: Psychiatry

## 2022-01-06 ENCOUNTER — Encounter (HOSPITAL_COMMUNITY): Payer: Self-pay | Admitting: Psychiatry

## 2022-01-06 VITALS — Wt 236.0 lb

## 2022-01-06 DIAGNOSIS — F4321 Adjustment disorder with depressed mood: Secondary | ICD-10-CM

## 2022-01-06 DIAGNOSIS — F331 Major depressive disorder, recurrent, moderate: Secondary | ICD-10-CM | POA: Diagnosis not present

## 2022-01-06 DIAGNOSIS — F419 Anxiety disorder, unspecified: Secondary | ICD-10-CM

## 2022-01-06 MED ORDER — ARIPIPRAZOLE 5 MG PO TABS: ORAL_TABLET | ORAL | 0 refills | Status: DC

## 2022-01-06 MED ORDER — BUPROPION HCL ER (XL) 300 MG PO TB24: 300.0000 mg | ORAL_TABLET | Freq: Every day | ORAL | 0 refills | Status: DC

## 2022-01-06 NOTE — Progress Notes (Signed)
Virtual Visit via Telephone Note  I connected with [name] on 01/06/22 at  9:00 AM EDT by telephone and verified that I am speaking with the correct person using two identifiers.  Location: Patient: In Car Provider: Home Office   I discussed the limitations, risks, security and privacy concerns of performing an evaluation and management service by telephone and the availability of in person appointments. I also discussed with the patient that there may be a patient responsible charge related to this service. The patient expressed understanding and agreed to proceed.   History of Present Illness: Patient is evaluated by phone session.  He is in therapy through hospice for grief.  Patient told that is going well as he see once a week with a therapist.  He has fewer crying spells and he denies any feeling of hopelessness or worthlessness.  He is sleeping better.  His wife died last year and he was going through grief.  He is taking his medication as prescribed.  He has no tremors, shakes or any EPS.  His energy level is good.  He is working 6 hours a day and tried to keep himself busy.  He denies any hallucination or any paranoia.  His appetite is okay.  His weight is unchanged.  He is also taking gabapentin, Klonopin, BuSpar and trazodone from his PCP.  He has no rash.  Like to continue his current medication.  Past Psychiatric History: Reviewed. H/O depression and anxiety.  No h/o suicidal attempt or inpatient treatment.  Seen Dr. Jake Michaelis and given Zoloft, Klonopin and Cymbalta.  Later PCP added BuSpar and trazodone.  We tried Lamictal but did not help.   Psychiatric Specialty Exam: Physical Exam  Review of Systems  Weight 236 lb (107 kg).There is no height or weight on file to calculate BMI.  General Appearance: NA  Eye Contact:  NA  Speech:  Clear and Coherent and Normal Rate  Volume:  Normal  Mood:  Dysphoric  Affect:  NA  Thought Process:  Goal Directed  Orientation:  Full  (Time, Place, and Person)  Thought Content:  Logical  Suicidal Thoughts:  No  Homicidal Thoughts:  No  Memory:  Immediate;   Good Recent;   Good Remote;   Good  Judgement:  Good  Insight:  Present  Psychomotor Activity:  NA  Concentration:  Concentration: Good and Attention Span: Good  Recall:  Good  Fund of Knowledge:  Good  Language:  Good  Akathisia:  No  Handed:  Right  AIMS (if indicated):     Assets:  Communication Skills Housing Social Support Transportation  ADL's:  Intact  Cognition:  WNL  Sleep:   ok      Assessment and Plan: Major depressive disorder, recurrent.  Anxiety.  Grief.  Patient is stable on his current medication.  Encouraged to continue grief counseling through hospice.  Discussed polypharmacy.  At this time patient does not want to change her cut down the medication.  Continue Wellbutrin XL 3 mg daily and Abilify 5 mg daily.  He is having a blood work for his hemoglobin A1c in few weeks.  He is also taking BuSpar, gabapentin, Klonopin and trazodone from his PCP.  Recommended to call us back if is any question or any concern.  Follow-up in 3 months.    Follow Up Instructions:    I discussed the assessment and treatment plan with the patient. The patient was provided an opportunity to ask questions and all were answered.  The patient agreed with the plan and demonstrated an understanding of the instructions.   The patient was advised to call back or seek an in-person evaluation if the symptoms worsen or if the condition fails to improve as anticipated.  Collaboration of Care: Other provider involved in patient's care AEB notes are available in epic to review.  Patient/Guardian was advised Release of Information must be obtained prior to any record release in order to collaborate their care with an outside provider. Patient/Guardian was advised if they have not already done so to contact the registration department to sign all necessary forms in order for  [place] to release information regarding their care.   Consent: Patient/Guardian gives verbal consent for treatment and assignment of benefits for services provided during this visit. Patient/Guardian expressed understanding and agreed to proceed.    I provided 20 minutes of non-face-to-face time during this encounter.   Kathlee Nations, MD

## 2022-01-07 ENCOUNTER — Other Ambulatory Visit: Payer: Self-pay | Admitting: Internal Medicine

## 2022-01-11 ENCOUNTER — Other Ambulatory Visit: Payer: Medicare HMO

## 2022-01-16 ENCOUNTER — Ambulatory Visit (INDEPENDENT_AMBULATORY_CARE_PROVIDER_SITE_OTHER): Payer: Medicare HMO | Admitting: Internal Medicine

## 2022-01-16 ENCOUNTER — Encounter: Payer: Self-pay | Admitting: Internal Medicine

## 2022-01-16 VITALS — BP 140/80 | HR 68 | Temp 98.2°F | Resp 17 | Ht 72.0 in | Wt 233.2 lb

## 2022-01-16 DIAGNOSIS — E1169 Type 2 diabetes mellitus with other specified complication: Secondary | ICD-10-CM

## 2022-01-16 DIAGNOSIS — Z7189 Other specified counseling: Secondary | ICD-10-CM

## 2022-01-16 DIAGNOSIS — F331 Major depressive disorder, recurrent, moderate: Secondary | ICD-10-CM

## 2022-01-16 DIAGNOSIS — E034 Atrophy of thyroid (acquired): Secondary | ICD-10-CM

## 2022-01-16 DIAGNOSIS — E785 Hyperlipidemia, unspecified: Secondary | ICD-10-CM | POA: Diagnosis not present

## 2022-01-16 DIAGNOSIS — Z23 Encounter for immunization: Secondary | ICD-10-CM | POA: Diagnosis not present

## 2022-01-16 DIAGNOSIS — D510 Vitamin B12 deficiency anemia due to intrinsic factor deficiency: Secondary | ICD-10-CM | POA: Diagnosis not present

## 2022-01-16 DIAGNOSIS — E1140 Type 2 diabetes mellitus with diabetic neuropathy, unspecified: Secondary | ICD-10-CM

## 2022-01-16 DIAGNOSIS — R609 Edema, unspecified: Secondary | ICD-10-CM | POA: Diagnosis not present

## 2022-01-16 LAB — COMPREHENSIVE METABOLIC PANEL
ALT: 20 U/L (ref 0–53)
AST: 19 U/L (ref 0–37)
Albumin: 4 g/dL (ref 3.5–5.2)
Alkaline Phosphatase: 93 U/L (ref 39–117)
BUN: 14 mg/dL (ref 6–23)
CO2: 30 mEq/L (ref 19–32)
Calcium: 8.9 mg/dL (ref 8.4–10.5)
Chloride: 100 mEq/L (ref 96–112)
Creatinine, Ser: 0.98 mg/dL (ref 0.40–1.50)
GFR: 85.79 mL/min (ref >60.00)
Glucose, Bld: 136 mg/dL — ABNORMAL HIGH (ref 70–99)
Potassium: 4.1 mEq/L (ref 3.5–5.1)
Sodium: 138 mEq/L (ref 135–145)
Total Bilirubin: 0.5 mg/dL (ref 0.2–1.2)
Total Protein: 6.3 g/dL (ref 6.0–8.3)

## 2022-01-16 LAB — LIPID PANEL
Cholesterol: 103 mg/dL (ref 0–200)
HDL: 30.1 mg/dL — ABNORMAL LOW (ref >39.00)
LDL Cholesterol: 48 mg/dL (ref 0–99)
NonHDL: 72.46
Total CHOL/HDL Ratio: 3
Triglycerides: 120 mg/dL (ref 0.0–149.0)
VLDL: 24 mg/dL (ref 0.0–40.0)

## 2022-01-16 LAB — TSH: TSH: 3.1 u[IU]/mL (ref 0.35–5.50)

## 2022-01-16 MED ORDER — EMPAGLIFLOZIN 10 MG PO TABS: 10.0000 mg | ORAL_TABLET | Freq: Every day | ORAL | 2 refills | Status: DC

## 2022-01-16 MED ORDER — CLONAZEPAM 2 MG PO TABS: 1.0000 mg | ORAL_TABLET | Freq: Two times a day (BID) | ORAL | 5 refills | Status: DC | PRN

## 2022-01-16 NOTE — Assessment & Plan Note (Signed)
Complicated by grief. DNR request denied. Not suicidal.  Encouraged to reach out to Dr Adele Schilder and consider joining a church that has grief counsellors since he can't afford to continue counselling since hospice counselling only extends to one year.

## 2022-01-16 NOTE — Assessment & Plan Note (Signed)
Amlodipine stopped by his cardiologist. Adding Jardiance today  Lab Results  Component Value Date   NA 139 09/30/2021   K 4.0 09/30/2021   CL 100 09/30/2021   CO2 33 (H) 09/30/2021   Lab Results  Component Value Date   CREATININE 0.89 09/30/2021

## 2022-01-16 NOTE — Assessment & Plan Note (Signed)
Hi is risk for CAD is managed with atorvastatin 20 mg daily. LDL  Has been < 100 and is diabetes appears to be less  well controlled on current regimen.  He will start jardiance and we will adjust atorvastatin based ontotday's LDL  Lab Results  Component Value Date   CHOL 161 09/30/2021   HDL 35.50 (L) 09/30/2021   LDLCALC 52 06/06/2021   LDLDIRECT 116.0 09/30/2021   TRIG 231.0 (H) 09/30/2021   CHOLHDL 5 09/30/2021   Lab Results  Component Value Date   HGBA1C 6.8 (H) 09/30/2021

## 2022-01-16 NOTE — Assessment & Plan Note (Signed)
He needs to continue counselling but apparently only received one year through Hospice.  Encouraged to consider joining a church, [employer] or [place] given his [place] /

## 2022-01-16 NOTE — Progress Notes (Signed)
Subjective:  Patient ID: [name], male    DOB: September 27, 1965  Age: 56 y.o. MRN: [id number]  CC: The primary encounter diagnosis was Need for immunization against influenza. Diagnoses of Type 2 diabetes mellitus with diabetic neuropathy, without long-term current use of insulin ([place]), Hypothyroidism due to acquired atrophy of thyroid, Pernicious anemia, MDD (major depressive disorder), recurrent episode, moderate ([place]), Hyperlipidemia associated with type 2 diabetes mellitus ([place]), 1+ pitting edema, and Grief counseling were also pertinent to this visit.   HPI [name] presents for follow up on chronic pain    type 2 DM with obesity, hypertension and OSA,  hyperlipidemia and hypothyroisiam  Chief Complaint  Patient presents with   Follow-up    3 mth f/u    1) positive depression screen:  complicated by unresolved  grief  over the loss of his beloved wife [name]  who died one year ago.  Has been having a hard time for the last week . Has not reached out to his psychiatrist Dr Adele Schilder because he was feeling better at their last televisit  one week ago . He has been receiving grief counselling through Hospice but cannot afford psychotherapy.  He is in touch with his children.  He has a [place] faith but does not currently belong to a church.  He is requesting DNR order but denies suicidality   2) CAD:  he has had a medication change .  metoprolol and amlodipine stopped 3 weeks ago and carvedilol started by his cardiologist  3) D M Type 2:  his blood sugars have been labile,  a few morning readings of 200,  other randoms are as low as 80.  Has not checked post prandial evening . He is snacking at night on junk food.    Outpatient Medications Prior to Visit  Medication Sig Dispense Refill   acetaminophen (TYLENOL) 325 MG tablet Take 1-2 tablets (325-650 mg total) by mouth every 6 (six) hours as needed for mild pain (pain score 1-3 or temp > 100.5). 30 tablet 0   ARIPiprazole (ABILIFY)  5 MG tablet Take one tab at bed time 90 tablet 0   atorvastatin (LIPITOR) 80 MG tablet Take 1 tablet (80 mg total) by mouth daily. 180 tablet 3   Blood Glucose Monitoring Suppl (ACCU-CHEK AVIVA PLUS) w/Device KIT Use to check blood sugars up to 4 times daily. ICD-10 E11.40 1 kit 0   buPROPion (WELLBUTRIN XL) 300 MG 24 hr tablet Take 1 tablet (300 mg total) by mouth daily. 90 tablet 0   busPIRone (BUSPAR) 15 MG tablet Take 1 tablet (15 mg total) by mouth 3 (three) times daily. 90 tablet 5   carvedilol (COREG) 12.5 MG tablet Take 1 tablet (12.5 mg total) by mouth 2 (two) times daily. 180 tablet 3   celecoxib (CELEBREX) 200 MG capsule Take 200 mg by mouth daily.     clobetasol (TEMOVATE) 0.05 % external solution Apply 1 application  topically 2 (two) times daily as needed (scalp irritation).     Clobetasol Propionate 0.05 % shampoo Apply to dry scalp and leave on for 15 minutes 118 mL 5   dicyclomine (BENTYL) 20 MG tablet Take 1 tablet (20 mg total) by mouth every 6 (six) hours. 120 tablet 0   ezetimibe (ZETIA) 10 MG tablet Take 1 tablet (10 mg total) by mouth daily. 90 tablet 3   furosemide (LASIX) 20 MG tablet Take 1 tablet (20 mg total) by mouth 2 (two) times a week. [place]  tablet 3   gabapentin (NEURONTIN) 600 MG tablet TAKE 1 TABLET BY MOUTH THREE TIMES DAILY 270 tablet 0   glucose blood (ACCU-CHEK AVIVA PLUS) test strip Use to check blood sugars up to 4 times daily. ICD-10 E11.40 400 each 0   glucose blood test strip Use once daily to check BS at various times  E11.9 100 each 12   HYDROcodone-acetaminophen (NORCO) 10-325 MG tablet Take 1 tablet by mouth 2 (two) times daily as needed. 60 tablet 0   Lancets (ACCU-CHEK MULTICLIX) lancets Use to to check blood sugars up to 4 times daily. ICD-10: E11.40 400 each 0   levothyroxine (SYNTHROID) 88 MCG tablet TAKE 1 TABLET BY MOUTH ONCE DAILY BEFORE BREAKFAST 90 tablet 1   losartan (COZAAR) 100 MG tablet Take 1 tablet (100 mg total) by mouth daily. 90  tablet 3   metFORMIN (GLUCOPHAGE) 500 MG tablet TAKE 1 TABLET BY MOUTH TWICE DAILY WITH A MEAL 180 tablet 1   methocarbamol (ROBAXIN) 500 MG tablet Take 1 tablet (500 mg total) by mouth every 8 (eight) hours as needed. 90 tablet 2   Multiple Vitamin (MULTIVITAMIN WITH MINERALS) TABS tablet Take 1 tablet by mouth daily.     naloxone (NARCAN) nasal spray 4 mg/0.1 mL Use as needed for excessive sedation /opioid overdose 1 each 1   omeprazole (PRILOSEC) 40 MG capsule TAKE 1 CAPSULE BY MOUTH IN THE MORNING AND AT BEDTIME 180 capsule 0   promethazine (PHENERGAN) 12.5 MG tablet Take 1 tablet (12.5 mg total) by mouth every 4 (four) hours as needed for nausea or vomiting. (Patient taking differently: Take 12.5 mg by mouth every 8 (eight) hours as needed for nausea or vomiting.) 20 tablet 0   rivaroxaban (XARELTO) 20 MG TABS tablet Take 1 tablet (20 mg total) by mouth daily with supper. 30 tablet 0   traZODone (DESYREL) 100 MG tablet TAKE 1 & 1/2 (ONE & ONE-HALF) TABLETS BY MOUTH AT BEDTIME 135 tablet 2   clonazePAM (KLONOPIN) 2 MG tablet Take 0.5 tablets (1 mg total) by mouth 2 (two) times daily as needed for anxiety. 30 tablet 2   HYDROcodone-acetaminophen (NORCO) 10-325 MG tablet Take 1 tablet by mouth 2 (two) times daily as needed. 60 tablet 0   No facility-administered medications prior to visit.    Review of Systems;  Patient denies headache, fevers, malaise, unintentional weight loss, skin rash, eye pain, sinus congestion and sinus pain, sore throat, dysphagia,  hemoptysis , cough, dyspnea, wheezing, chest pain, palpitations, orthopnea, edema, abdominal pain, nausea, melena, diarrhea, constipation, flank pain, dysuria, hematuria, urinary  Frequency, nocturia, numbness, tingling, seizures,  Focal weakness, Loss of consciousness,  Tremor, insomnia, depression, anxiety, and suicidal ideation.      Objective:  BP (!) 140/80 (BP Location: Left Arm, Patient Position: Sitting, Cuff Size: Large)   Pulse  68   Temp 98.2 F (36.8 C) (Oral)   Resp 17   Ht 6' (1.829 m)   Wt 233 lb 4 oz (105.8 kg)   SpO2 96%   BMI 31.63 kg/m   BP Readings from Last 3 Encounters:  01/16/22 (!) 140/80  12/14/21 (!) 142/78  09/30/21 122/82    Wt Readings from Last 3 Encounters:  01/16/22 233 lb 4 oz (105.8 kg)  01/06/22 236 lb (107 kg)  12/14/21 236 lb (107 kg)    General appearance: alert, cooperative and appears stated age Ears: normal TM's and external ear canals both ears Throat: lips, mucosa, and tongue normal; teeth and gums  normal Neck: no adenopathy, no carotid bruit, supple, symmetrical, trachea midline and thyroid not enlarged, symmetric, no tenderness/mass/nodules Back: symmetric, no curvature. ROM normal. No CVA tenderness. Lungs: clear to auscultation bilaterally Heart: regular rate and rhythm, S1, S2 normal, no murmur, click, rub or gallop Abdomen: soft, non-tender; bowel sounds normal; no masses,  no organomegaly Pulses: 2+ and symmetric Skin: Skin color, texture, turgor normal. No rashes or lesions Lymph nodes: Cervical, supraclavicular, and axillary nodes normal.  Psych: affect depressed,  makes good eye contact. No fidgeting,  tearful with prolonged conversation .  Denies suicidal thoughts   Lab Results  Component Value Date   HGBA1C 6.8 (H) 09/30/2021   HGBA1C 6.7 (H) 06/06/2021   HGBA1C 6.5 (H) 02/07/2021    Lab Results  Component Value Date   CREATININE 0.89 09/30/2021   CREATININE 0.93 06/06/2021   CREATININE 0.90 02/11/2021    Lab Results  Component Value Date   WBC 10.9 (H) 02/12/2021   HGB 11.9 (L) 02/12/2021   HCT 34.4 (L) 02/12/2021   PLT 189 02/12/2021   GLUCOSE 94 09/30/2021   CHOL 161 09/30/2021   TRIG 231.0 (H) 09/30/2021   HDL 35.50 (L) 09/30/2021   LDLDIRECT 116.0 09/30/2021   LDLCALC 52 06/06/2021   ALT 35 09/30/2021   AST 27 09/30/2021   NA 139 09/30/2021   K 4.0 09/30/2021   CL 100 09/30/2021   CREATININE 0.89 09/30/2021   BUN 10  09/30/2021   CO2 33 (H) 09/30/2021   TSH 2.04 09/30/2021   PSA 0.70 10/16/2017   INR 0.98 08/27/2015   HGBA1C 6.8 (H) 09/30/2021   MICROALBUR 4.2 (H) 06/06/2021    MR LUMBAR SPINE WO CONTRAST  Result Date: 09/29/2021 CLINICAL DATA:  Lumbar radiculopathy M54.16 (ICD-10-CM). Low back pain, symptoms persist with > 6 wks treatment. 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 EXAM: MRI LUMBAR SPINE WITHOUT CONTRAST TECHNIQUE: Multiplanar, multisequence MR imaging of the lumbar spine was performed. No intravenous contrast was administered. COMPARISON:  MRI of the lumbar spine December 05 2018. FINDINGS: Segmentation:  Standard. Alignment:  Trace retrolisthesis at L2-3 and L3-4. Vertebrae:  No fracture, evidence of discitis, or bone lesion. Conus medullaris and cauda equina: Conus extends to the L1-2 level. Conus and cauda equina appear normal. Paraspinal and other soft tissues: Negative. Disc levels: T12-L1: No spinal canal or neural foraminal stenosis. L1-2: No spinal canal or neural foraminal stenosis. L2-3: Shallow disc bulge and mild bilateral facet joint effusion. No significant spinal canal or neural foraminal stenosis. L3-4: Left subarticular to far lateral disc protrusion, moderate hypertrophic facet degenerative changes with trace bilateral joint effusion resulting in mild narrowing of the left subarticular zone and mild left neural foraminal narrowing, unchanged. L4-5: Disc bulge, moderate hypertrophic facet degenerative changes and mild ligamentum flavum redundancy resulting in mild-to-moderate right and mild left neural foraminal narrowing. No significant spinal canal stenosis. Findings have progressed since prior MRI. L5-S1: Shallow disc bulge and mild facet degenerative changes without significant spinal canal or neural foraminal stenosis, unchanged. IMPRESSION: 1. Progression of degenerative changes at L4-5 now with mild-to-moderate right and mild left neural. 2. Stable  left subarticular to far lateral disc protrusion at L3-4 resulting in mild narrowing of the left subarticular zone and mild left neural foraminal narrowing. Electronically Signed   By: Pedro Earls M.D.   On: 09/29/2021 17:20    Assessment & Plan:   Problem List Items Addressed This Visit     Type 2 diabetes mellitus  with diabetic neuropathy, unspecified ([place])    Adding Jardiance today at starting dose.  Continue metformin       Relevant Medications   empagliflozin (JARDIANCE) 10 MG TABS tablet   Other Relevant Orders   Hemoglobin A1c   Comprehensive metabolic panel   Lipid panel   Pernicious anemia   Relevant Orders   CBC with Differential/Platelet   MDD (major depressive disorder), recurrent episode, moderate ([place])    Complicated by grief. DNR request denied. Not suicidal.  Encouraged to reach out to Dr Adele Schilder and consider joining a church that has grief counsellors since he can't afford to continue counselling since hospice counselling only extends to one year.         Hypothyroidism due to acquired atrophy of thyroid   Relevant Orders   TSH   Hyperlipidemia associated with type 2 diabetes mellitus ([place])    Hi is risk for CAD is managed with atorvastatin 20 mg daily. LDL  Has been < 100 and is diabetes appears to be less  well controlled on current regimen.  He will start jardiance and we will adjust atorvastatin based ontotday's LDL  Lab Results  Component Value Date   CHOL 161 09/30/2021   HDL 35.50 (L) 09/30/2021   LDLCALC 52 06/06/2021   LDLDIRECT 116.0 09/30/2021   TRIG 231.0 (H) 09/30/2021   CHOLHDL 5 09/30/2021   Lab Results  Component Value Date   HGBA1C 6.8 (H) 09/30/2021         Relevant Medications   empagliflozin (JARDIANCE) 10 MG TABS tablet   Grief counseling    He needs to continue counselling but apparently only received one year through Hospice.  Encouraged to consider joining a church, [employer] or [place] given his  [place] faith /       1+ pitting edema    Amlodipine stopped by his cardiologist. Adding Jardiance today  Lab Results  Component Value Date   NA 139 09/30/2021   K 4.0 09/30/2021   CL 100 09/30/2021   CO2 33 (H) 09/30/2021   Lab Results  Component Value Date   CREATININE 0.89 09/30/2021         Other Visit Diagnoses     Need for immunization against influenza    -  Primary   Relevant Orders   Flu Vaccine QUAD 9moIM (Fluarix, Fluzone & Alfiuria Quad PF) (Completed)       I spent a total of  40 minutes with this patient in a face to face visit on the date of this encounter reviewing the last office visit with  his psychiatrist, his most recent visit with cardiology ,    patient's diet and exercise habits, home blood pressure /blood sugar readings, counselling, and post visit ordering of testing and therapeutics.    Follow-up: Return in about 3 months (around 04/17/2022).   TCrecencio Mc MD

## 2022-01-16 NOTE — Assessment & Plan Note (Signed)
Adding Jardiance today at starting dose.  Continue metformin

## 2022-01-16 NOTE — Patient Instructions (Addendum)
We are starting Jardiance which is good for diabetes and for heart disease  Please Check sugars 2 hours after dinner,  or after your evening snack   for  the next 2 weeks   PLEASE CONSIDER [facility]  OR [facility]

## 2022-01-17 ENCOUNTER — Other Ambulatory Visit: Payer: Self-pay

## 2022-01-17 ENCOUNTER — Telehealth (HOSPITAL_COMMUNITY): Payer: Self-pay | Admitting: *Deleted

## 2022-01-17 LAB — HEMOGLOBIN A1C: Hgb A1c MFr Bld: 6.8 % — ABNORMAL HIGH (ref 4.6–6.5)

## 2022-01-17 MED ORDER — ACCU-CHEK AVIVA PLUS VI STRP: ORAL_STRIP | 0 refills | Status: DC

## 2022-01-17 MED ORDER — HYDROCODONE-ACETAMINOPHEN 10-325 MG PO TABS: 1.0000 | ORAL_TABLET | Freq: Two times a day (BID) | ORAL | 0 refills | Status: DC | PRN

## 2022-01-17 NOTE — Telephone Encounter (Signed)
Writer spoke with pt after he had expressed increasing depression, and wanting to be made a DNR, to his PCP. Pt stated that he would like an earlier appointment with Dr. Adele Schilder as he has had increase in depression and is still grieving his wife. Pt denies suicidal thoughts currently. Pt did sound anxious and depressed. Writer assured pt that we would set up an earlier appointment (currently scheduled for 04/07/22) and advised pt to call Hospice and ask if he may go back for grief counseling. Pt verbalized understanding. Front desk given message to add earlier appointment.

## 2022-01-17 NOTE — Telephone Encounter (Signed)
Refilled: 11/29/2021 Last OV: 01/16/2022 Next OV: 04/19/2022

## 2022-01-17 NOTE — Telephone Encounter (Signed)
Thanks for update

## 2022-01-17 NOTE — Telephone Encounter (Signed)
Refill request has been sent to Dr. Derrel Nip.

## 2022-01-18 ENCOUNTER — Telehealth (HOSPITAL_COMMUNITY): Payer: Self-pay | Admitting: Psychiatry

## 2022-01-18 ENCOUNTER — Telehealth (HOSPITAL_BASED_OUTPATIENT_CLINIC_OR_DEPARTMENT_OTHER): Payer: Medicare HMO | Admitting: Psychiatry

## 2022-01-18 ENCOUNTER — Encounter: Payer: Self-pay | Admitting: Internal Medicine

## 2022-01-18 ENCOUNTER — Encounter (HOSPITAL_COMMUNITY): Payer: Self-pay | Admitting: Psychiatry

## 2022-01-18 VITALS — Wt 233.0 lb

## 2022-01-18 DIAGNOSIS — F419 Anxiety disorder, unspecified: Secondary | ICD-10-CM | POA: Diagnosis not present

## 2022-01-18 DIAGNOSIS — F4321 Adjustment disorder with depressed mood: Secondary | ICD-10-CM | POA: Diagnosis not present

## 2022-01-18 DIAGNOSIS — F331 Major depressive disorder, recurrent, moderate: Secondary | ICD-10-CM

## 2022-01-18 NOTE — Telephone Encounter (Signed)
D:  Dr. Adele Schilder referred pt to [place].  A:  Placed call to orient pt and to schedule a date for an assessment.  After orienting pt, pt is requesting to contact his insurance company to see how much they will pay/cover.  Encouraged pt to call the case manager back after he speaks to [facility].  Inform Dr. Adele Schilder.

## 2022-01-18 NOTE — Progress Notes (Signed)
Virtual Visit via Telephone Note  I connected with [name] on 01/18/22 at  1:00 PM EDT by telephone and verified that I am speaking with the correct person using two identifiers.  Location: Patient: Home Provider: Home Office   I discussed the limitations, risks, security and privacy concerns of performing an evaluation and management service by telephone and the availability of in person appointments. I also discussed with the patient that there may be a patient responsible charge related to this service. The patient expressed understanding and agreed to proceed.   History of Present Illness: Patient is evaluated by phone session.  This is an earlier appointment scheduled because patient had recently visit with PCP and requested and a DNR.  His PCP recommended an earlier appointment.  Patient admitted that lately he has been not doing very well after he watch a movie "" dirty dancing".  Patient told he always watched this movie with his wife and after watching the movie he started to have more depressed, isolated, poor sleep and racing thoughts.  He admitted feeling very lonely and bored.  He is also trying to start a new relationship but so far he has no luck.  He admitted lack of motivation to do things.  He has not played golf in past 6 months.  He tried to help his friend who has a machine shop and patient goes there for a few hours every day.  He tried to keep himself busy.  He had a good support from his friends and family and he talked to his family at least 2 times a week.  He denies any hallucination, paranoia, active suicidal thoughts but sometimes he feels lack of motivation and desire to do things.  He admitted a few times crying spells, decreased energy, decreased appetite but his weight is unchanged from the past.  He admitted sometimes negative thoughts which he like to push them away.  We have recommended grief counseling but so far he has no luck with the hospice because it has  been more than a year since his wife died and he does not meet criteria for hospice.  He is taking all his medication as prescribed.  He is taking Abilify and Wellbutrin from [place] and other medication given by his PCP.  He denies any agitation, anger.  He does drink beer 2-3 times a week which has not changed since his wife died.  He admitted his biggest concern is boredom and loneliness.  Past Psychiatric History:  H/O depression and anxiety.  No h/o suicidal attempt or inpatient treatment.  Seen Dr. Jake Michaelis and given Zoloft, Klonopin and Cymbalta.  Later PCP added BuSpar and trazodone.  We tried Lamictal but did not help.    Psychiatric Specialty Exam: Physical Exam  Review of Systems  Weight 233 lb (105.7 kg).There is no height or weight on file to calculate BMI.  General Appearance: NA  Eye Contact:  NA  Speech:  Slow  Volume:  Decreased  Mood:  Depressed, Dysphoric, and Hopeless  Affect:  NA  Thought Process:  Descriptions of Associations: Intact  Orientation:  Full (Time, Place, and Person)  Thought Content:  Rumination  Suicidal Thoughts:  No  Homicidal Thoughts:  No  Memory:  Immediate;   Good Recent;   Good Remote;   Good  Judgement:  Intact  Insight:  Present  Psychomotor Activity:  NA  Concentration:  Concentration: Good and Attention Span: Good  Recall:  Good  Fund of Knowledge:  Good  Language:  Good  Akathisia:  No  Handed:  Right  AIMS (if indicated):     Assets:  Communication Skills Desire for Improvement Housing Social Support Transportation  ADL's:  Intact  Cognition:  WNL  Sleep:   fair      Assessment and Plan: Major depressive disorder, recurrent.  Anxiety.  Grief.  His PHQ 10 which is unchanged from the last visit with his PCP.  He denies any active suicidal thoughts or homicidal thoughts.  He reported that he liked to have a DNR in the future because he does not want to go through suffering but he is not thinking about taking his own life.  He  wants to start a new relationship.  We talk about IOP since he has a hard time getting back to hospice grief counseling.  Patient agreed to try IOP.  We decided not to change any medication and he will do the IOP.  We will refer him for IOP group therapy.  I also recommend if he started to have any active suicidal thoughts or homicidal thought that he need to call 911 or go to local emergency room.  We will schedule a follow-up appointment once he finished IOP.  No change in his medication.  Follow Up Instructions:    I discussed the assessment and treatment plan with the patient. The patient was provided an opportunity to ask questions and all were answered. The patient agreed with the plan and demonstrated an understanding of the instructions.   The patient was advised to call back or seek an in-person evaluation if the symptoms worsen or if the condition fails to improve as anticipated.  Collaboration of Care: Primary Care Provider AEB I will forward my note to his PCP.  Patient/Guardian was advised Release of Information must be obtained prior to any record release in order to collaborate their care with an outside provider. Patient/Guardian was advised if they have not already done so to contact the registration department to sign all necessary forms in order for [place] to release information regarding their care.   Consent: Patient/Guardian gives verbal consent for treatment and assignment of benefits for services provided during this visit. Patient/Guardian expressed understanding and agreed to proceed.    I provided 25 minutes of non-face-to-face time during this encounter.   Kathlee Nations, MD

## 2022-01-21 ENCOUNTER — Other Ambulatory Visit: Payer: Self-pay | Admitting: Internal Medicine

## 2022-01-21 DIAGNOSIS — F331 Major depressive disorder, recurrent, moderate: Secondary | ICD-10-CM

## 2022-01-21 DIAGNOSIS — F411 Generalized anxiety disorder: Secondary | ICD-10-CM

## 2022-01-23 ENCOUNTER — Telehealth: Payer: Self-pay

## 2022-01-23 DIAGNOSIS — F331 Major depressive disorder, recurrent, moderate: Secondary | ICD-10-CM

## 2022-01-23 DIAGNOSIS — F411 Generalized anxiety disorder: Secondary | ICD-10-CM

## 2022-01-23 MED ORDER — BUSPIRONE HCL 15 MG PO TABS: 15.0000 mg | ORAL_TABLET | Freq: Three times a day (TID) | ORAL | 1 refills | Status: DC

## 2022-01-23 NOTE — Telephone Encounter (Signed)
Medication has been refilled.

## 2022-01-23 NOTE — Telephone Encounter (Signed)
Patient states he is out of his busPIRone (BUSPAR) 15 MG tablet.  Patient would like to request a refill.  *Patient states his preferred pharmacy is [facility] on [address]

## 2022-01-25 ENCOUNTER — Encounter: Payer: Self-pay | Admitting: Orthopedic Surgery

## 2022-01-25 ENCOUNTER — Ambulatory Visit: Payer: Medicare HMO | Admitting: Orthopedic Surgery

## 2022-01-25 ENCOUNTER — Ambulatory Visit (INDEPENDENT_AMBULATORY_CARE_PROVIDER_SITE_OTHER): Payer: Medicare HMO

## 2022-01-25 DIAGNOSIS — M25511 Pain in right shoulder: Secondary | ICD-10-CM | POA: Diagnosis not present

## 2022-01-25 NOTE — Progress Notes (Signed)
Office Visit Note   Patient: [name]           Date of Birth: Feb 04, 1966           MRN: [id number] Visit Date: 01/25/2022 Requested by: [name], MD [address] [place],  [place] [address] PCP: [name], MD  Subjective: Chief Complaint  Patient presents with   Right Shoulder - Pain    HPI: [name] is a 56 y.o. male who presents to the office reporting right shoulder pain 3 months duration.  He was reaching to pull the recliner handle when he felt a pop in his shoulder.  He has had pain and weakness in the shoulder since that time.  He is right-hand dominant.  Pain wakes him from sleep most nights.  Taking Tylenol for symptoms.  Patiently currently is not working.  Having no real problems with that shoulder before that.  Has a history of remote surgery which is biceps tenodesis and arthroscopic distal clavicle excision.  He states that going up on the upstroke with his steering wheel is painful for him.              ROS: All systems reviewed are negative as they relate to the chief complaint within the history of present illness.  Patient denies fevers or chills.  Assessment & Plan: Visit Diagnoses:  1. Right shoulder pain, unspecified chronicity     Plan: Impression is right shoulder external rotation weakness with history very suggestive of rotator cuff pathology.  Radiographs unremarkable except for mild decrease in acromiohumeral distance.  Plan MRI arthrogram right shoulder to evaluate infraspinatus/supraspinatus tear.  Follow-up after that study.  Follow-Up Instructions: No follow-ups on file.   Orders:  Orders Placed This Encounter  Procedures   XR Shoulder Right   MR SHOULDER RIGHT W CONTRAST   Arthrogram   No orders of the defined types were placed in this encounter.     Procedures: No procedures performed   Clinical Data: No additional findings.  Objective: Vital Signs: There were no vitals taken for this  visit.  Physical Exam:  Constitutional: Patient appears well-developed HEENT:  Head: Normocephalic Eyes:EOM are normal Neck: Normal range of motion Cardiovascular: Normal rate Pulmonary/chest: Effort normal Neurologic: Patient is alert Skin: Skin is warm Psychiatric: Patient has normal mood and affect  Ortho Exam: Ortho exam demonstrates good cervical spine range of motion.  Passive range of motion of both shoulders is 65/95/165.  Rotator cuff strength 4- out of 5 external rotation on the right compared to 5+ out of 5 on the left.  Subscap strength 5+ out of 5 bilaterally.  No Popeye deformity present right arm versus left arm.  The biceps is slightly asymmetric but this is likely from prior surgery.  No bruising ecchymosis noted in that right shoulder girdle region.  Does have a little bit of crepitus anteriorly with internal/external rotation of that right arm.  Specialty Comments:  EXAM: MRI LUMBAR SPINE WITHOUT CONTRAST   TECHNIQUE: Multiplanar, multisequence MR imaging of the lumbar spine was performed. No intravenous contrast was administered.   COMPARISON:  MRI of the lumbar spine December 05 2018.   FINDINGS: Segmentation:  Standard.   Alignment:  Trace retrolisthesis at L2-3 and L3-4.   Vertebrae:  No fracture, evidence of discitis, or bone lesion.   Conus medullaris and cauda equina: Conus extends to the L1-2 level. Conus and cauda equina appear normal.   Paraspinal and other soft tissues:  Negative.   Disc levels:   T12-L1: No spinal canal or neural foraminal stenosis.   L1-2: No spinal canal or neural foraminal stenosis.   L2-3: Shallow disc bulge and mild bilateral facet joint effusion. No significant spinal canal or neural foraminal stenosis.   L3-4: Left subarticular to far lateral disc protrusion, moderate hypertrophic facet degenerative changes with trace bilateral joint effusion resulting in mild narrowing of the left subarticular zone and mild left  neural foraminal narrowing, unchanged.   L4-5: Disc bulge, moderate hypertrophic facet degenerative changes and mild ligamentum flavum redundancy resulting in mild-to-moderate right and mild left neural foraminal narrowing. No significant spinal canal stenosis. Findings have progressed since prior MRI.   L5-S1: Shallow disc bulge and mild facet degenerative changes without significant spinal canal or neural foraminal stenosis, unchanged.   IMPRESSION: 1. Progression of degenerative changes at L4-5 now with mild-to-moderate right and mild left neural. 2. Stable left subarticular to far lateral disc protrusion at L3-4 resulting in mild narrowing of the left subarticular zone and mild left neural foraminal narrowing.     Electronically Signed   By: Pedro Earls M.D.   On: 09/29/2021 17:20  Imaging: No results found.   PMFS History: Patient Active Problem List   Diagnosis Date Noted   Pernicious anemia 10/12/2021   Chronic pain associated with significant psychosocial dysfunction 03/05/2021   History of deep venous thrombosis (DVT) of distal vein of left lower extremity 03/05/2021   Arthritis of left knee    Osteoarthritis 02/10/2021   S/P TKR (total knee replacement), left 02/10/2021   Grief counseling 11/14/2020   Abnormal electrocardiogram (ECG) (EKG) 09/02/2020   Preoperative evaluation to rule out surgical contraindication 09/02/2020   MDD (major depressive disorder), recurrent episode, moderate ([facility]) 05/29/2020   Colon cancer screening 02/02/2020   Eczema of both external ears 12/08/2019   1+ pitting edema 12/08/2019   Polyarthritis of multiple sites 10/27/2019   Coronary artery calcification seen on CT scan 10/27/2019   Radiculopathy, cervical region 06/10/2019   Carpal tunnel syndrome, left upper limb 06/10/2019   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   Spinal stenosis of cervical region 12/31/2018   Loose body of left knee    Plantar fasciitis of  left foot 08/24/2017   Chronic pain of both hips 06/20/2017   Complete rotator cuff tear of left shoulder 09/14/2015   Hypothyroidism due to acquired atrophy of thyroid 09/14/2015   Arthritis of knee 06/23/2014   Encounter for preventive health examination 06/17/2014   Enlarged RV (right ventricle) 06/17/2014   GERD (gastroesophageal reflux disease) 04/20/2014   Cervical spine degeneration 02/17/2014   Overweight 10/29/2013   Chronic knee pain 10/29/2013   Type 2 diabetes mellitus with diabetic neuropathy, unspecified ([facility]) 10/23/2012   Seborrheic dermatitis, unspecified 05/17/2012   Fatty liver 04/22/2012   Hypogonadism male 03/12/2012   Chronic pain of multiple joints 12/24/2010   Sciatica of left side associated with disorder of lumbar spine 12/24/2010   Hyperlipidemia associated with type 2 diabetes mellitus ([facility]) 01/13/2009   Anxiety state 01/13/2009   Essential hypertension 01/13/2009   OSA (obstructive sleep apnea) 01/13/2009   Past Medical History:  Diagnosis Date   Acute medial meniscus tear of left knee    Alcohol-induced chronic pancreatitis ([facility]) 11/20/2016   Anxiety    Anxiety and depression    Arthritis    knees and shoulders   Benzodiazepine withdrawal with complication ([facility]) 20/25/4270   Bruit    L   Chest pain  hx   Cognitive complaints with normal neuropsychological exam 10/23/2016   Diabetes mellitus without complication ([facility])    "borderline", diet controlled, no meds, patient has lost 30 lbs   Edema    Fatty liver    GERD (gastroesophageal reflux disease)    uses Omeprazole   Goiter    HLD (hyperlipidemia)    Hypertension    essential, benign   Hypothyroidism    Impotence of organic origin    Murmur    never has caused any problems   Neuromuscular disorder ([facility])    Other chest pain    tightness, pressure   Palpitation    hx   Precordial pain    Sleep apnea    uses cpap    Family History  Problem Relation Age of Onset   Heart disease  Father    Colon cancer Neg Hx    Colon polyps Neg Hx    Esophageal cancer Neg Hx    Rectal cancer Neg Hx    Stomach cancer Neg Hx     Past Surgical History:  Procedure Laterality Date   APPENDECTOMY  2002   done at [facility] N/A 08/27/2015   Procedure: Left Heart Cath and Coronary Angiography;  Surgeon: Jolaine Artist, MD;  Location: [facility];  Service: Cardiovascular;  Laterality: N/A;   COLONOSCOPY  in his 20's   EAR CYST EXCISION Left 07/08/2019   Procedure: open excision baker's cyst left knee;  Surgeon: Meredith Pel, MD;  Location: [facility];  Service: Orthopedics;  Laterality: Left;   HERNIA REPAIR  1-69-67   umbilical   JOINT REPLACEMENT     right knee   KNEE ARTHROSCOPY Left 12/17/2018   Procedure: left knee arthroscopy, meniscal debridement, loose body removal;  Surgeon: Meredith Pel, MD;  Location: [facility];  Service: Orthopedics;  Laterality: Left;   REVISION TOTAL KNEE ARTHROPLASTY Right 06/23/2014   DR Loralye Loberg   TONSILLECTOMY     TOTAL KNEE ARTHROPLASTY Right 2011   right   TOTAL KNEE ARTHROPLASTY Left 02/10/2021   Procedure: LEFT TOTAL KNEE ARTHROPLASTY;  Surgeon: Meredith Pel, MD;  Location: [facility];  Service: Orthopedics;  Laterality: Left;   TOTAL KNEE REVISION Right 06/23/2014   Procedure: TOTAL KNEE REVISION;  Surgeon: Meredith Pel, MD;  Location: [facility];  Service: Orthopedics;  Laterality: Right;   UPPER GASTROINTESTINAL ENDOSCOPY  04/30/2013   Judieth Keens TOOTH EXTRACTION     Social History   Occupational History   Occupation: Personnel officer: Public relations account executive   Occupation: SERVICE DIRECTOR    Employer: [employer]  Tobacco Use   Smoking status: Former    Packs/day: 2.00    Years: 18.00    Total pack years: 36.00    Types: Cigarettes    Quit date: 04/24/2005    Years since quitting: 16.7   Smokeless tobacco: Never   Tobacco  comments:    quit 20 years ago   Vaping Use   Vaping Use: Never used  Substance and Sexual Activity   Alcohol use: Not Currently    Alcohol/week: 4.0 standard drinks of alcohol    Types: 4 Cans of beer per week   Drug use: No   Sexual activity: Yes

## 2022-01-30 ENCOUNTER — Ambulatory Visit (INDEPENDENT_AMBULATORY_CARE_PROVIDER_SITE_OTHER): Payer: Medicare HMO

## 2022-01-30 VITALS — Ht 72.0 in | Wt 233.0 lb

## 2022-01-30 DIAGNOSIS — E538 Deficiency of other specified B group vitamins: Secondary | ICD-10-CM | POA: Diagnosis not present

## 2022-01-30 DIAGNOSIS — Z0000 Encounter for general adult medical examination without abnormal findings: Secondary | ICD-10-CM

## 2022-01-30 MED ORDER — CYANOCOBALAMIN 1000 MCG/ML IJ SOLN
1000.0000 ug | Freq: Once | INTRAMUSCULAR | Status: AC
  Administered 2022-01-30: 1000 ug via INTRAMUSCULAR

## 2022-01-30 NOTE — Progress Notes (Signed)
Pt presented for his vitamin b12 injection. Pt was identified through two identifiers. Pt tolerated shot well in his left deltoid.    [place], CMA

## 2022-01-30 NOTE — Progress Notes (Addendum)
Subjective:   [name] is a 56 y.o. male who presents for an Initial Medicare Annual Wellness Visit.  Review of Systems    No ROS.  Medicare Wellness Virtual Visit.  Visual/audio telehealth visit, UTA vital signs.   See social history for additional risk factors.   Cardiac Risk Factors include: advanced age (>34mn, >>5women);diabetes mellitus;hypertension;male gender     Objective:    Today's Vitals   01/30/22 1117  Weight: 233 lb (105.7 kg)  Height: 6' (1.829 m)   Body mass index is 31.6 kg/m.     01/30/2022   11:26 AM 02/10/2021   11:59 AM 02/07/2021    9:51 AM 10/06/2019    8:23 PM 07/04/2019   12:22 PM 07/04/2019   12:02 PM 12/17/2018    1:12 PM  Advanced Directives  Does Patient Have a Medical Advance Directive? Yes No No Yes  Yes Yes  Type of [place]   Living will;Healthcare Power of [place] will [place]  Does patient want to make changes to medical advance directive? No - Patient declined No - Patient declined     No - Patient declined  Copy of [place] Chart? No - copy requested   No - copy requested No - copy requested  No - copy requested  Would patient like information on creating a medical advance directive?  No - Patient declined No - Patient declined        Current Medications (verified) Outpatient Encounter Medications as of 01/30/2022  Medication Sig   acetaminophen (TYLENOL) 325 MG tablet Take 1-2 tablets (325-650 mg total) by mouth every 6 (six) hours as needed for mild pain (pain score 1-3 or temp > 100.5).   ARIPiprazole (ABILIFY) 5 MG tablet Take one tab at bed time   atorvastatin (LIPITOR) 80 MG tablet Take 1 tablet (80 mg total) by mouth daily.   Blood Glucose Monitoring Suppl (ACCU-CHEK AVIVA PLUS) w/Device KIT Use to check blood sugars up to 4 times daily. ICD-10 E11.40   buPROPion (WELLBUTRIN XL)  300 MG 24 hr tablet Take 1 tablet (300 mg total) by mouth daily.   busPIRone (BUSPAR) 15 MG tablet Take 1 tablet (15 mg total) by mouth 3 (three) times daily.   carvedilol (COREG) 12.5 MG tablet Take 1 tablet (12.5 mg total) by mouth 2 (two) times daily.   celecoxib (CELEBREX) 200 MG capsule Take 200 mg by mouth daily.   clobetasol (TEMOVATE) 0.05 % external solution Apply 1 application  topically 2 (two) times daily as needed (scalp irritation).   Clobetasol Propionate 0.05 % shampoo Apply to dry scalp and leave on for 15 minutes   clonazePAM (KLONOPIN) 2 MG tablet Take 0.5 tablets (1 mg total) by mouth 2 (two) times daily as needed for anxiety.   dicyclomine (BENTYL) 20 MG tablet Take 1 tablet (20 mg total) by mouth every 6 (six) hours.   empagliflozin (JARDIANCE) 10 MG TABS tablet Take 1 tablet (10 mg total) by mouth daily.   ezetimibe (ZETIA) 10 MG tablet Take 1 tablet (10 mg total) by mouth daily.   furosemide (LASIX) 20 MG tablet Take 1 tablet (20 mg total) by mouth 2 (two) times a week.   gabapentin (NEURONTIN) 600 MG tablet TAKE 1 TABLET BY MOUTH THREE TIMES DAILY   glucose blood (ACCU-CHEK AVIVA PLUS) test strip Use to check blood sugars up to 4 times daily.  ICD-10 E11.40   glucose blood test strip Use once daily to check BS at various times  E11.9   HYDROcodone-acetaminophen (NORCO) 10-325 MG tablet Take 1 tablet by mouth 2 (two) times daily as needed.   Lancets (ACCU-CHEK MULTICLIX) lancets Use to to check blood sugars up to 4 times daily. ICD-10: E11.40   levothyroxine (SYNTHROID) 88 MCG tablet TAKE 1 TABLET BY MOUTH ONCE DAILY BEFORE BREAKFAST   losartan (COZAAR) 100 MG tablet Take 1 tablet (100 mg total) by mouth daily.   metFORMIN (GLUCOPHAGE) 500 MG tablet TAKE 1 TABLET BY MOUTH TWICE DAILY WITH A MEAL   methocarbamol (ROBAXIN) 500 MG tablet Take 1 tablet (500 mg total) by mouth every 8 (eight) hours as needed.   Multiple Vitamin (MULTIVITAMIN WITH MINERALS) TABS tablet Take 1  tablet by mouth daily.   naloxone (NARCAN) nasal spray 4 mg/0.1 mL Use as needed for excessive sedation /opioid overdose   omeprazole (PRILOSEC) 40 MG capsule TAKE 1 CAPSULE BY MOUTH IN THE MORNING AND AT BEDTIME   promethazine (PHENERGAN) 12.5 MG tablet Take 1 tablet (12.5 mg total) by mouth every 4 (four) hours as needed for nausea or vomiting. (Patient taking differently: Take 12.5 mg by mouth every 8 (eight) hours as needed for nausea or vomiting.)   rivaroxaban (XARELTO) 20 MG TABS tablet Take 1 tablet (20 mg total) by mouth daily with supper.   traZODone (DESYREL) 100 MG tablet TAKE 1 & 1/2 (ONE & ONE-HALF) TABLETS BY MOUTH AT BEDTIME   No facility-administered encounter medications on file as of 01/30/2022.    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   Anxiety    Anxiety and depression    Arthritis    knees and shoulders   Benzodiazepine withdrawal with complication ([place]) 16/01/9603   Bruit    L   Chest pain    hx   Cognitive complaints with normal neuropsychological exam 10/23/2016   Diabetes mellitus without complication (HCC)    "borderline", diet controlled, no meds, patient has lost 30 lbs   Edema    Fatty liver    GERD (gastroesophageal reflux disease)    uses Omeprazole   Goiter    HLD (hyperlipidemia)    Hypertension    essential, benign   Hypothyroidism    Impotence of organic origin    Murmur    never has caused any problems   Neuromuscular disorder ([place])    Other chest pain    tightness, pressure   Palpitation    hx   Precordial pain    Sleep apnea    uses cpap   Past Surgical History:  Procedure Laterality Date   APPENDECTOMY  2002   done at [facility] N/A 08/27/2015   Procedure: Left Heart Cath and Coronary Angiography;  Surgeon: Jolaine Artist, MD;   Location: [facility];  Service: Cardiovascular;  Laterality: N/A;   COLONOSCOPY  in his 20's   EAR CYST EXCISION Left 07/08/2019   Procedure: open excision baker's cyst left knee;  Surgeon: Meredith Pel, MD;  Location: [facility];  Service: Orthopedics;  Laterality: Left;   HERNIA REPAIR  5-40-98   umbilical   JOINT REPLACEMENT     right knee   KNEE ARTHROSCOPY Left 12/17/2018   Procedure: left knee arthroscopy, meniscal debridement,  loose body removal;  Surgeon: Meredith Pel, MD;  Location: [facility];  Service: Orthopedics;  Laterality: Left;   REVISION TOTAL KNEE ARTHROPLASTY Right 06/23/2014   DR DEAN   TONSILLECTOMY     TOTAL KNEE ARTHROPLASTY Right 2011   right   TOTAL KNEE ARTHROPLASTY Left 02/10/2021   Procedure: LEFT TOTAL KNEE ARTHROPLASTY;  Surgeon: Meredith Pel, MD;  Location: [facility];  Service: Orthopedics;  Laterality: Left;   TOTAL KNEE REVISION Right 06/23/2014   Procedure: TOTAL KNEE REVISION;  Surgeon: Meredith Pel, MD;  Location: [facility];  Service: Orthopedics;  Laterality: Right;   UPPER GASTROINTESTINAL ENDOSCOPY  04/30/2013   Judieth Keens TOOTH EXTRACTION     Family History  Problem Relation Age of Onset   Heart disease Father    Colon cancer Neg Hx    Colon polyps Neg Hx    Esophageal cancer Neg Hx    Rectal cancer Neg Hx    Stomach cancer Neg Hx    Social History   Socioeconomic History   Marital status: Widowed    Spouse name: Not on file   Number of children: 2   Years of education: Not on file   Highest education level: Not on file  Occupational History   Occupation: Maintenance supervisor    Employer: [employer]   Occupation: SERVICE DIRECTOR    Employer: [employer]  Tobacco Use   Smoking status: Former    Packs/day: 2.00    Years: 18.00    Total pack years: 36.00    Types: Cigarettes    Quit date: 04/24/2005    Years since quitting: 16.7   Smokeless tobacco: Never   Tobacco comments:     quit 20 years ago   Vaping Use   Vaping Use: Never used  Substance and Sexual Activity   Alcohol use: Not Currently    Alcohol/week: 4.0 standard drinks of alcohol    Types: 4 Cans of beer per week   Drug use: No   Sexual activity: Yes  Other Topics Concern   Not on file  Social History Narrative   Married, gets regular exercise.    Social Determinants of Health   Financial Resource Strain: Low Risk  (01/30/2022)   Overall Financial Resource Strain (CARDIA)    Difficulty of Paying Living Expenses: Not hard at all  Food Insecurity: No Food Insecurity (01/30/2022)   Hunger Vital Sign    Worried About Running Out of Food in the Last Year: Never true    Ran Out of Food in the Last Year: Never true  Transportation Needs: No Transportation Needs (01/30/2022)   PRAPARE - Hydrologist (Medical): No    Lack of Transportation (Non-Medical): No  Physical Activity: Not on file  Stress: No Stress Concern Present (01/30/2022)   [place]    Feeling of Stress : Not at all  Social Connections: Unknown (01/30/2022)   Social Connection and Isolation Panel [NHANES]    Frequency of Communication with Friends and Family: More than three times a week    Frequency of Social Gatherings with Friends and Family: Not on file    Attends Religious Services: Not on file    Active Member of Clubs or Organizations: Not on file    Attends Archivist Meetings: Not on file    Marital Status: Widowed    Tobacco Counseling Counseling given: Not Answered  Tobacco comments: quit 20 years ago    Clinical Intake:  Pre-visit preparation completed: Yes        Diabetes: Yes (Followed by PCP)  How often do you need to have someone help you when you read instructions, pamphlets, or other written materials from your doctor or pharmacy?: 1 - Never    Interpreter Needed?: No      Activities of Daily  Living    01/30/2022   11:19 AM 02/10/2021   10:00 PM  In your present state of health, do you have any difficulty performing the following activities:  Hearing? 0 0  Vision? 0 0  Difficulty concentrating or making decisions? 0 0  Walking or climbing stairs? 1 0  Comment Paces self.   Dressing or bathing? 0 0  Doing errands, shopping? 0 0  Preparing Food and eating ? N   Using the Toilet? N   In the past six months, have you accidently leaked urine? N   Do you have problems with loss of bowel control? N   Managing your Medications? N   Managing your Finances? N   Housekeeping or managing your Housekeeping? N     Patient Care Team: [name], MD as PCP - General (Internal Medicine) Bary Castilla, Forest Gleason, MD (General Surgery) Sherryl Barters, NP as Nurse Practitioner (Nurse Practitioner) [name], MD (Internal Medicine)  Indicate any recent Medical Services you may have received from other than [facility] providers in the past year (date may be approximate).     Assessment:   This is a routine wellness examination for [place].  I connected with  Sunday Corn on 01/30/22 by a audio enabled telemedicine application and verified that I am speaking with the correct person using two identifiers.  Patient Location: Home  Provider Location: Home Office  I discussed the limitations of evaluation and management by telemedicine. The patient expressed understanding and agreed to proceed.   Hearing/Vision screen Hearing Screening - Comments:: Patient is able to hear conversational tones without difficulty.  No issues reported. Vision Screening - Comments:: Followed by [facility] Wears readers They have seen their ophthalmologist in the last 12 months.    Dietary issues and exercise activities discussed: Current Exercise Habits: Home exercise routine, Intensity: Mild Regular diet Good water intake   Goals Addressed             This Visit's Progress     Maintain healthy lifestyle       Stay active Healthy diet       Depression Screen    01/18/2022    1:14 PM 01/16/2022    8:45 AM 09/30/2021    9:56 AM 06/06/2021   10:04 AM 06/06/2021   10:02 AM 12/31/2020    9:12 AM 12/07/2020    1:39 PM  PHQ 2/9 Scores  PHQ - 2 Score  _0 PHQ- 9 Score  _1 Information is confidential and restricted. Go to Review Flowsheets to unlock data.    Fall Risk    01/30/2022   11:19 AM 01/16/2022    8:18 AM 09/30/2021    9:54 AM 06/06/2021   10:02 AM 12/07/2020    1:39 PM  Fall Risk   Falls in the past year? 0 0 0 1 0  Number falls in past yr: 0 0 0 1   Injury with Fall? 0 0 0    Risk for  fall due to :   No Fall Risks History of fall(s)   Follow up Falls evaluation completed;Falls prevention discussed  Falls evaluation completed Falls evaluation completed Falls evaluation completed    FALL RISK PREVENTION PERTAINING TO THE HOME: Home free of loose throw rugs in walkways, pet beds, electrical cords, etc? Yes  Adequate lighting in your home to reduce risk of falls? Yes   ASSISTIVE DEVICES UTILIZED TO PREVENT FALLS: Life alert? No  Use of a cane, walker or w/c? Yes  Grab bars in the bathroom? No  Shower chair or bench in shower? No  Elevated toilet seat or a handicapped toilet? No   TIMED UP AND GO: Was the test performed? No .   Cognitive Function:        01/30/2022   12:23 PM  6CIT Screen  What Year? 0 points  What month? 0 points  What time? 0 points  Count back from 20 0 points  Months in reverse 0 points  Repeat phrase 0 points  Total Score 0 points    Immunizations Immunization History  Administered Date(s) Administered   Hepatitis A 01/31/2011   Hepatitis B 01/30/2010, 03/02/2010, 08/31/2010   Influenza Inj Mdck Quad Pf 01/27/2018   Influenza Split 01/29/2012, 01/28/2015   Influenza,inj,Quad PF,6+ Mos 01/30/2013, 02/16/2014, 02/23/2017, 05/28/2020, 02/11/2021, 01/16/2022   Influenza-Unspecified 01/20/2015    Moderna Sars-Covid-2 Vaccination 11/20/2019, 12/18/2019, 05/21/2020   Pneumococcal Polysaccharide-23 03/30/2015   Tdap 10/16/2017   Zoster Recombinat (Shingrix) 04/04/2018   Screening Tests Health Maintenance  Topic Date Due   COVID-19 Vaccine (4 - Moderna series) 02/01/2022 (Originally 07/16/2020)   Zoster Vaccines- Shingrix (2 of 2) 04/17/2022 (Originally 05/30/2018)   Diabetic kidney evaluation - Urine ACR  06/06/2022   FOOT EXAM  06/06/2022   HEMOGLOBIN A1C  07/17/2022   OPHTHALMOLOGY EXAM  09/16/2022   Diabetic kidney evaluation - GFR measurement  01/17/2023   COLONOSCOPY (Pts 45-5yr Insurance coverage will need to be confirmed)  04/09/2023   TETANUS/TDAP  10/17/2027   INFLUENZA VACCINE  Completed   Hepatitis C Screening  Completed   HIV Screening  Completed   HPV VACCINES  Aged Out   Health Maintenance There are no preventive care reminders to display for this patient.  Lung Cancer Screening: (Low Dose CT Chest recommended if Age 57-80years, 30 pack-year currently smoking OR have quit w/in 15years.) does not qualify.   Hepatitis C Screening: Completed 2013.  Vision Screening: Recommended annual ophthalmology exams for early detection of glaucoma and other disorders of the eye.  Dental Screening: Recommended annual dental exams for proper oral hygiene  Community Resource Referral / Chronic Care Management: CRR required this visit?  No   CCM required this visit?  No      Plan:   Patient reports the numbness in his feet is worsening. No falls. Cane in use when ambulating. Follow up appointment scheduled with PCP.   I have personally reviewed and noted the following in the patient's chart:   Medical and social history Use of alcohol, tobacco or illicit drugs  Current medications and supplements including opioid prescriptions. Patient is currently taking opioid prescriptions. Information provided to patient regarding non-opioid alternatives. Patient advised to discuss  non-opioid treatment plan with their provider. Followed by PCP.  Functional ability and status Nutritional status Physical activity Advanced directives List of other physicians Hospitalizations, surgeries, and ER visits in previous 12 months Vitals Screenings to include cognitive, depression, and falls Referrals and appointments  In addition, I have  reviewed and discussed with patient certain preventive protocols, quality metrics, and best practice recommendations. A written personalized care plan for preventive services as well as general preventive health recommendations were provided to patient.     OBrien-Blaney,  L, LPN   00/10/1217     I have reviewed the above information and agree with above.   Deborra Medina, MD

## 2022-01-30 NOTE — Patient Instructions (Addendum)
[name] , Thank you for taking time to come for your Medicare Wellness Visit. I appreciate your ongoing commitment to your health goals. Please review the following plan we discussed and let me know if I can assist you in the future.   These are the goals we discussed:  Goals      Maintain healthy lifestyle     Stay active Healthy diet        This is a list of the screening recommended for you and due dates:  Health Maintenance  Topic Date Due   COVID-19 Vaccine (4 - Moderna series) 02/01/2022*   Zoster (Shingles) Vaccine (2 of 2) 04/17/2022*   Yearly kidney health urinalysis for diabetes  06/06/2022   Complete foot exam   06/06/2022   Hemoglobin A1C  07/17/2022   Eye exam for diabetics  09/16/2022   Yearly kidney function blood test for diabetes  01/17/2023   Colon Cancer Screening  04/09/2023   Tetanus Vaccine  10/17/2027   Flu Shot  Completed   Hepatitis C Screening: USPSTF Recommendation to screen - Ages 18-79 yo.  Completed   HIV Screening  Completed   HPV Vaccine  Aged Out  *Topic was postponed. The date shown is not the original due date.    Advanced directives: End of life planning; Advance aging; Advanced directives discussed.  Copy of current HCPOA/Living Will requested.    Conditions/risks identified: none new  Next appointment: Follow up in one year for your annual wellness visit   Preventive Care 40-64 Years, Male Preventive care refers to lifestyle choices and visits with your health care provider that can promote health and wellness. What does preventive care include? A yearly physical exam. This is also called an annual well check. Dental exams once or twice a year. Routine eye exams. Ask your health care provider how often you should have your eyes checked. Personal lifestyle choices, including: Daily care of your teeth and gums. Regular physical activity. Eating a healthy diet. Avoiding tobacco and drug use. Limiting alcohol use. Practicing safe  sex. Taking low-dose aspirin every day starting at age 15. What happens during an annual well check? The services and screenings done by your health care provider during your annual well check will depend on your age, overall health, lifestyle risk factors, and family history of disease. Counseling  Your health care provider may ask you questions about your: Alcohol use. Tobacco use. Drug use. Emotional well-being. Home and relationship well-being. Sexual activity. Eating habits. Work and work Statistician. Screening  You may have the following tests or measurements: Height, weight, and BMI. Blood pressure. Lipid and cholesterol levels. These may be checked every 5 years, or more frequently if you are over 71 years old. Skin check. Lung cancer screening. You may have this screening every year starting at age 69 if you have a 30-pack-year history of smoking and currently smoke or have quit within the past 15 years. Fecal occult blood test (FOBT) of the stool. You may have this test every year starting at age 65. Flexible sigmoidoscopy or colonoscopy. You may have a sigmoidoscopy every 5 years or a colonoscopy every 10 years starting at age 47. Prostate cancer screening. Recommendations will vary depending on your family history and other risks. Hepatitis C blood test. Hepatitis B blood test. Sexually transmitted disease (STD) testing. Diabetes screening. This is done by checking your blood sugar (glucose) after you have not eaten for a while (fasting). You may have this done every 1-3 years.  Discuss your test results, treatment options, and if necessary, the need for more tests with your health care provider. Vaccines  Your health care provider may recommend certain vaccines, such as: Influenza vaccine. This is recommended every year. Tetanus, diphtheria, and acellular pertussis (Tdap, Td) vaccine. You may need a Td booster every 10 years. Zoster vaccine. You may need this after age  36. Pneumococcal 13-valent conjugate (PCV13) vaccine. You may need this if you have certain conditions and have not been vaccinated. Pneumococcal polysaccharide (PPSV23) vaccine. You may need one or two doses if you smoke cigarettes or if you have certain conditions. Talk to your health care provider about which screenings and vaccines you need and how often you need them. This information is not intended to replace advice given to you by your health care provider. Make sure you discuss any questions you have with your health care provider. Document Released: 05/07/2015 Document Revised: 12/29/2015 Document Reviewed: 02/09/2015 Elsevier Interactive Patient Education  2017 [place] Prevention in the Home Falls can cause injuries. They can happen to people of all ages. There are many things you can do to make your home safe and to help prevent falls. What can I do on the outside of my home? Regularly fix the edges of walkways and driveways and fix any cracks. Remove anything that might make you trip as you walk through a door, such as a raised step or threshold. Trim any bushes or trees on the path to your home. Use bright outdoor lighting. Clear any walking paths of anything that might make someone trip, such as rocks or tools. Regularly check to see if handrails are loose or broken. Make sure that both sides of any steps have handrails. Any raised decks and porches should have guardrails on the edges. Have any leaves, snow, or ice cleared regularly. Use sand or salt on walking paths during winter. Clean up any spills in your garage right away. This includes oil or grease spills. What can I do in the bathroom? Use night lights. Install grab bars by the toilet and in the tub and shower. Do not use towel bars as grab bars. Use non-skid mats or decals in the tub or shower. If you need to sit down in the shower, use a plastic, non-slip stool. Keep the floor dry. Clean up any water  that spills on the floor as soon as it happens. Remove soap buildup in the tub or shower regularly. Attach bath mats securely with double-sided non-slip rug tape. Do not have throw rugs and other things on the floor that can make you trip. What can I do in the bedroom? Use night lights. Make sure that you have a light by your bed that is easy to reach. Do not use any sheets or blankets that are too big for your bed. They should not hang down onto the floor. Have a firm chair that has side arms. You can use this for support while you get dressed. Do not have throw rugs and other things on the floor that can make you trip. What can I do in the kitchen? Clean up any spills right away. Avoid walking on wet floors. Keep items that you use a lot in easy-to-reach places. If you need to reach something above you, use a strong step stool that has a grab bar. Keep electrical cords out of the way. Do not use floor polish or wax that makes floors slippery. If you must use wax, use non-skid  floor wax. Do not have throw rugs and other things on the floor that can make you trip. What can I do with my stairs? Do not leave any items on the stairs. Make sure that there are handrails on both sides of the stairs and use them. Fix handrails that are broken or loose. Make sure that handrails are as long as the stairways. Check any carpeting to make sure that it is firmly attached to the stairs. Fix any carpet that is loose or worn. Avoid having throw rugs at the top or bottom of the stairs. If you do have throw rugs, attach them to the floor with carpet tape. Make sure that you have a light switch at the top of the stairs and the bottom of the stairs. If you do not have them, ask someone to add them for you. What else can I do to help prevent falls? Wear shoes that: Do not have high heels. Have rubber bottoms. Are comfortable and fit you well. Are closed at the toe. Do not wear sandals. If you use a  stepladder: Make sure that it is fully opened. Do not climb a closed stepladder. Make sure that both sides of the stepladder are locked into place. Ask someone to hold it for you, if possible. Clearly mark and make sure that you can see: Any grab bars or handrails. First and last steps. Where the edge of each step is. Use tools that help you move around (mobility aids) if they are needed. These include: Canes. Walkers. Scooters. Crutches. Turn on the lights when you go into a dark area. Replace any light bulbs as soon as they burn out. Set up your furniture so you have a clear path. Avoid moving your furniture around. If any of your floors are uneven, fix them. If there are any pets around you, be aware of where they are. Review your medicines with your doctor. Some medicines can make you feel dizzy. This can increase your chance of falling. Ask your doctor what other things that you can do to help prevent falls. This information is not intended to replace advice given to you by your health care provider. Make sure you discuss any questions you have with your health care provider. Document Released: 02/04/2009 Document Revised: 09/16/2015 Document Reviewed: 05/15/2014 Elsevier Interactive Patient Education  2017 [place].  Opioid Pain Medicine Management Opioids are powerful medicines that are used to treat moderate to severe pain. When used for short periods of time, they can help you to: Sleep better. Do better in physical or occupational therapy. Feel better in the first few days after an injury. Recover from surgery. Opioids should be taken with the supervision of a trained health care provider. They should be taken for the shortest period of time possible. This is because opioids can be addictive, and the longer you take opioids, the greater your risk of addiction. This addiction can also be called opioid use disorder. What are the risks? Using opioid pain medicines for longer  than 3 days increases your risk of side effects. Side effects include: Constipation. Nausea and vomiting. Breathing difficulties (respiratory depression). Drowsiness. Confusion. Opioid use disorder. Itching. Taking opioid pain medicine for a long period of time can affect your ability to do daily tasks. It also puts you at risk for: Motor vehicle crashes. Depression. Suicide. Heart attack. Overdose, which can be life-threatening. What is a pain treatment plan? A pain treatment plan is an agreement between you and your health care  provider. Pain is unique to each person, and treatments vary depending on your condition. To manage your pain, you and your health care provider need to work together. To help you do this: Discuss the goals of your treatment, including how much pain you might expect to have and how you will manage the pain. Review the risks and benefits of taking opioid medicines. Remember that a good treatment plan uses more than one approach and minimizes the chance of side effects. Be honest about the amount of medicines you take and about any drug or alcohol use. Get pain medicine prescriptions from only one health care provider. Pain can be managed with many types of alternative treatments. Ask your health care provider to refer you to one or more specialists who can help you manage pain through: Physical or occupational therapy. Counseling (cognitive behavioral therapy). Good nutrition. Biofeedback. Massage. Meditation. Non-opioid medicine. Following a gentle exercise program. How to use opioid pain medicine Taking medicine Take your pain medicine exactly as told by your health care provider. Take it only when you need it. If your pain gets less severe, you may take less than your prescribed dose if your health care provider approves. If you are not having pain, do nottake pain medicine unless your health care provider tells you to take it. If your pain is severe, do  nottry to treat it yourself by taking more pills than instructed on your prescription. Contact your health care provider for help. Write down the times when you take your pain medicine. It is easy to become confused while on pain medicine. Writing the time can help you avoid overdose. Take other over-the-counter or prescription medicines only as told by your health care provider. Keeping yourself and others safe  While you are taking opioid pain medicine: Do not drive, use machinery, or power tools. Do not sign legal documents. Do not drink alcohol. Do not take sleeping pills. Do not supervise children by yourself. Do not do activities that require climbing or being in high places. Do not go to a lake, river, ocean, spa, or swimming pool. Do not share your pain medicine with anyone. Keep pain medicine in a locked cabinet or in a secure area where pets and children cannot reach it. Stopping your use of opioids If you have been taking opioid medicine for more than a few weeks, you may need to slowly decrease (taper) how much you take until you stop completely. Tapering your use of opioids can decrease your risk of symptoms of withdrawal, such as: Pain and cramping in the abdomen. Nausea. Sweating. Sleepiness. Restlessness. Uncontrollable shaking (tremors). Cravings for the medicine. Do not attempt to taper your use of opioids on your own. Talk with your health care provider about how to do this. Your health care provider may prescribe a step-down schedule based on how much medicine you are taking and how long you have been taking it. Getting rid of leftover pills Do not save any leftover pills. Get rid of leftover pills safely by: Taking the medicine to a prescription take-back program. This is usually offered by the county or law enforcement. Bringing them to a pharmacy that has a drug disposal container. Flushing them down the toilet. Check the label or package insert of your medicine to  see whether this is safe to do. Throwing them out in the trash. Check the label or package insert of your medicine to see whether this is safe to do. If it is safe to throw it  out, remove the medicine from the original container, put it into a sealable bag or container, and mix it with used coffee grounds, food scraps, dirt, or cat litter before putting it in the trash. Follow these instructions at home: Activity Do exercises as told by your health care provider. Avoid activities that make your pain worse. Return to your normal activities as told by your health care provider. Ask your health care provider what activities are safe for you. General instructions You may need to take these actions to prevent or treat constipation: Drink enough fluid to keep your urine pale yellow. Take over-the-counter or prescription medicines. Eat foods that are high in fiber, such as beans, whole grains, and fresh fruits and vegetables. Limit foods that are high in fat and processed sugars, such as fried or sweet foods. Keep all follow-up visits. This is important. Where to find support If you have been taking opioids for a long time, you may benefit from receiving support for quitting from a local support group or counselor. Ask your health care provider for a referral to these resources in your area. Where to find more information Centers for Disease Control and Prevention (CDC): http://www.wolf.info/ U.[place]. Food and Drug Administration (FDA): [place] Get help right away if: You may have taken too much of an opioid (overdosed). Common symptoms of an overdose: Your breathing is slower or more shallow than normal. You have a very slow heartbeat (pulse). You have slurred speech. You have nausea and vomiting. Your pupils become very small. You have other potential symptoms: You are very confused. You faint or feel like you will faint. You have cold, clammy skin. You have blue lips or fingernails. You have  thoughts of harming yourself or harming others. These symptoms may represent a serious problem that is an emergency. Do not wait to see if the symptoms will go away. Get medical help right away. Call your local emergency services (911 in the [place] Do not drive yourself to the hospital.  If you ever feel like you may hurt yourself or others, or have thoughts about taking your own life, get help right away. Go to your nearest emergency department or: Call your local emergency services (911 in the [place] Call the [facility] (219)642-5409 in the [place] Call a suicide crisis helpline, such as the [place] at 386-078-2746 or 988 in the [place]. This is open 24 hours a day in the [place] Text the Crisis Text Line at 769-467-9128 (in the [place].). Summary Opioid medicines can help you manage moderate to severe pain for a short period of time. A pain treatment plan is an agreement between you and your health care provider. Discuss the goals of your treatment, including how much pain you might expect to have and how you will manage the pain. If you think that you or someone else may have taken too much of an opioid, get medical help right away. This information is not intended to replace advice given to you by your health care provider. Make sure you discuss any questions you have with your health care provider. Document Revised: 11/03/2020 Document Reviewed: 07/21/2020 Elsevier Patient Education  [place].

## 2022-02-01 ENCOUNTER — Emergency Department
Admission: EM | Admit: 2022-02-01 | Discharge: 2022-02-01 | Disposition: A | Discharge status: Home / Self Care | Payer: Medicare HMO | Attending: Emergency Medicine | Admitting: Emergency Medicine

## 2022-02-01 ENCOUNTER — Other Ambulatory Visit: Payer: Self-pay

## 2022-02-01 ENCOUNTER — Emergency Department: Payer: Medicare HMO

## 2022-02-01 DIAGNOSIS — R079 Chest pain, unspecified: Secondary | ICD-10-CM | POA: Diagnosis not present

## 2022-02-01 DIAGNOSIS — R0789 Other chest pain: Secondary | ICD-10-CM | POA: Insufficient documentation

## 2022-02-01 DIAGNOSIS — E119 Type 2 diabetes mellitus without complications: Secondary | ICD-10-CM | POA: Insufficient documentation

## 2022-02-01 DIAGNOSIS — E039 Hypothyroidism, unspecified: Secondary | ICD-10-CM | POA: Diagnosis not present

## 2022-02-01 DIAGNOSIS — I10 Essential (primary) hypertension: Secondary | ICD-10-CM | POA: Diagnosis not present

## 2022-02-01 LAB — BASIC METABOLIC PANEL
Anion gap: 5 (ref 5–15)
BUN: 17 mg/dL (ref 6–20)
CO2: 31 mmol/L (ref 22–32)
Calcium: 9.1 mg/dL (ref 8.9–10.3)
Chloride: 102 mmol/L (ref 98–111)
Creatinine, Ser: 1.18 mg/dL (ref 0.61–1.24)
GFR, Estimated: >60 mL/min (ref >60)
Glucose, Bld: 141 mg/dL — ABNORMAL HIGH (ref 70–99)
Potassium: 4.1 mmol/L (ref 3.5–5.1)
Sodium: 138 mmol/L (ref 135–145)

## 2022-02-01 LAB — CBC
HCT: 43.7 % (ref 39.0–52.0)
Hemoglobin: 14.6 g/dL (ref 13.0–17.0)
MCH: 31.8 pg (ref 26.0–34.0)
MCHC: 33.4 g/dL (ref 30.0–36.0)
MCV: 95.2 fL (ref 80.0–100.0)
Platelets: 242 10*3/uL (ref 150–400)
RBC: 4.59 MIL/uL (ref 4.22–5.81)
RDW: 12.7 % (ref 11.5–15.5)
WBC: 7.2 10*3/uL (ref 4.0–10.5)
nRBC: 0.3 % — ABNORMAL HIGH (ref 0.0–0.2)

## 2022-02-01 LAB — TROPONIN I (HIGH SENSITIVITY)
Troponin I (High Sensitivity): 5 ng/L (ref <18)
Troponin I (High Sensitivity): 6 ng/L (ref <18)

## 2022-02-01 NOTE — ED Provider Notes (Signed)
[facility] Provider Note    Event Date/Time   First MD Initiated Contact with Patient 02/01/22 2101     (approximate)   History   Chief Complaint Chest Pain   HPI [name] is a 56 y.o. male, history of hyperlipidemia, anxiety, hypertension, OSA, type 2 diabetes, hypothyroidism, presents emergency department for evaluation of chest pain.  He states that he experienced 2 episodes of crushing chest pain since this morning.  One of them was at 10:30 AM, the other 1 was at 2 PM.  He states that they both lasted for about an hour, then self resolved.  He has never had this happen to him before.  He states that he was sitting down and resting at the time that this was happening.  He additionally does state that he has been feeling anxious for the past year of since his wife died.  He is currently asymptomatic.  Denies fever/chills, shortness of breath, abdominal pain, flank pain, nausea/vomiting, diarrhea, dysuria, rash/lesions, dizziness/lightheadedness, vision change, hearing changes, or headache.  History Limitations: No limitations.        Physical Exam  Triage Vital Signs: ED Triage Vitals  Enc Vitals Group     BP 02/01/22 1546 (!) 135/110     Pulse Rate 02/01/22 1546 92     Resp 02/01/22 1546 16     Temp 02/01/22 1546 98.6 F (37 C)     Temp Source 02/01/22 1546 Oral     SpO2 02/01/22 1546 96 %     Weight 02/01/22 1547 233 lb (105.7 kg)     Height 02/01/22 1547 6' (1.829 m)     Head Circumference --      Peak Flow --      Pain Score 02/01/22 1547 6     Pain Loc --      Pain Edu? --      Excl. in [place]? --     Most recent vital signs: Vitals:   02/01/22 1546  BP: (!) 135/110  Pulse: 92  Resp: 16  Temp: 98.6 F (37 C)  SpO2: 96%    General: Awake, NAD.  Skin: Warm, dry. No rashes or lesions.  Eyes: PERRL. Conjunctivae normal.  CV: Good peripheral perfusion.  S1-S2 present.  Mild systolic murmur. Resp: Normal effort.  Lung sounds  are clear bilaterally. Abd: Soft, non-tender. No distention.  Neuro: At baseline. No gross neurological deficits.  Musculoskeletal: Normal ROM of all extremities.  Focused Exam: No significant swelling or edema in the lower extremities.  Physical Exam    ED Results / Procedures / Treatments  Labs (all labs ordered are listed, but only abnormal results are displayed) Labs Reviewed  BASIC METABOLIC PANEL - Abnormal; Notable for the following components:      Result Value   Glucose, Bld 141 (*)    All other components within normal limits  CBC - Abnormal; Notable for the following components:   nRBC 0.3 (*)    All other components within normal limits  TROPONIN I (HIGH SENSITIVITY)  TROPONIN I (HIGH SENSITIVITY)     EKG Sinus rhythm, rate of 90, no significant ST segment changes, right axis deviation present, likely right BBB, no QT prolongation, QRS normal interval.    RADIOLOGY  ED Provider Interpretation: I personally reviewed and interpreted this x-ray, no evidence of acute cardiopulmonary abnormalities.  DG Chest 2 View  Result Date: 02/01/2022 CLINICAL DATA:  Chest pain. EXAM: CHEST - 2 VIEW COMPARISON:  Chest x-ray 11/20/2016. FINDINGS: The heart size and mediastinal contours are within normal limits. Both lungs are clear. No visible pleural effusions or pneumothorax. No acute osseous abnormality. IMPRESSION: No active cardiopulmonary disease. Electronically Signed   By: Margaretha Sheffield M.D.   On: 02/01/2022 16:19    PROCEDURES:  Critical Care performed: N/A.  Procedures    MEDICATIONS ORDERED IN ED: Medications - No data to display   IMPRESSION / MDM / [place] / ED COURSE  I reviewed the triage vital signs and the nursing notes.                              Differential diagnosis includes, but is not limited to, ACS, myocarditis, pericarditis, pneumonia, anxiety, costochondritis, GERD.   ED Course Patient appears well, vitals within  normal limits.  NAD.  CBC shows no leukocytosis or anemia.  BMP shows no electrolyte abnormalities or AKI.  EKG unremarkable.  Initial troponin 5.  Second troponin 6.  Unlikely ACS or marked rise/pericarditis.  Assessment/Plan Patient presents with 2 isolated episodes of crushing, central chest pain that lasted for approximately 1 hour.  Episodes began while he was at rest.  Physical exam is unremarkable.  His work-up shows no evidence of ACS or myocarditis/pericarditis.  Lab work-up is overall unremarkable.  Very low suspicion for pulmonary embolism, very few risk factors.  He does endorse increased anxiety over the past year, which I suspect may be contributing to his symptoms.  Currently asymptomatic at this time.  No further work-up or treatment indicated in the emergency department.  We will provide him with a cardiology referral for further evaluation given his history of hypertension and diabetes.  Patient was amenable to this plan.  With discharge.  Considered admission for this patient, but given his stable presentation and unremarkable work-up, he is unlikely to benefit from admission.  Provided the patient with anticipatory guidance, return precautions, and educational material. Encouraged the patient to return to the emergency department at any time if they begin to experience any new or worsening symptoms. Patient expressed understanding and agreed with the plan.   Patient's presentation is most consistent with acute complicated illness / injury requiring diagnostic workup.       FINAL CLINICAL IMPRESSION(S) / ED DIAGNOSES   Final diagnoses:  Atypical chest pain     Rx / DC Orders   ED Discharge Orders          Ordered    Ambulatory referral to Cardiology       Comments: If you have not heard from the Cardiology office within the next 72 hours please call [phone].   02/01/22 2109             Note:  This document was prepared using Dragon voice recognition  software and may include unintentional dictation errors.   Teodoro Spray, [place] 02/01/22 2230    Rada Hay, MD 02/02/22 Quentin Mulling

## 2022-02-01 NOTE — Discharge Instructions (Signed)
-  Please follow up with the cardiology office listed. They should be calling you within the next couple days. If they do not, please call them to schedule an appointment.   -Return to the emergency department at any time if you begin to experience any new or worsening symptoms.

## 2022-02-01 NOTE — ED Triage Notes (Signed)
Pt to ED for chest pain, 2 episodes today. 10 am and 2pm. Pain is sharp and pressure-like, radiating to back. EKG already handed to provider.

## 2022-02-01 NOTE — ED Provider Triage Note (Signed)
  Emergency Medicine Provider Triage Evaluation Note  [name] , a 56 y.o.male,  was evaluated in triage.  Pt complains of chest pain.  Patient states had 2 episodes of chest pain today, around 10 AM and 2 PM.  Described as sharp and pressure-like, radiating to the back.  Currently asymptomatic at this time.   Review of Systems  Positive: Chest pain Negative: Denies fever, abdominal pain, vomiting  Physical Exam   Vitals:   02/01/22 1546  BP: (!) 135/110  Pulse: 92  Resp: 16  Temp: 98.6 F (37 C)  SpO2: 96%   Gen:   Awake, no distress   Resp:  Normal effort  MSK:   Moves extremities without difficulty  Other:    Medical Decision Making  Given the patient's initial medical screening exam, the following diagnostic evaluation has been ordered. The patient will be placed in the appropriate treatment space, once one is available, to complete the evaluation and treatment. I have discussed the plan of care with the patient and I have advised the patient that an ED physician or mid-level practitioner will reevaluate their condition after the test results have been received, as the results may give them additional insight into the type of treatment they may need.    Diagnostics: Labs, CXR, EKG  Treatments: none immediately   Teodoro Spray, [place] 02/01/22 1626

## 2022-02-03 ENCOUNTER — Other Ambulatory Visit: Payer: Self-pay | Admitting: Internal Medicine

## 2022-02-03 ENCOUNTER — Telehealth: Payer: Self-pay | Admitting: Cardiology

## 2022-02-03 NOTE — Telephone Encounter (Signed)
Patient would like to switch from Dr. Stanford Breed to Dr. Rockey Situ.  The [facility] is close for him.  Are you both in agreement with this?

## 2022-02-08 ENCOUNTER — Other Ambulatory Visit: Payer: Self-pay | Admitting: Internal Medicine

## 2022-02-13 ENCOUNTER — Ambulatory Visit
Admission: RE | Admit: 2022-02-13 | Discharge: 2022-02-13 | Disposition: A | Discharge status: Home / Self Care | Payer: Medicare HMO | Source: Ambulatory Visit | Attending: Orthopedic Surgery | Admitting: Orthopedic Surgery

## 2022-02-13 DIAGNOSIS — M75121 Complete rotator cuff tear or rupture of right shoulder, not specified as traumatic: Secondary | ICD-10-CM | POA: Diagnosis not present

## 2022-02-13 DIAGNOSIS — S46011A Strain of muscle(s) and tendon(s) of the rotator cuff of right shoulder, initial encounter: Secondary | ICD-10-CM | POA: Diagnosis not present

## 2022-02-13 DIAGNOSIS — M25511 Pain in right shoulder: Secondary | ICD-10-CM

## 2022-02-13 MED ORDER — IOPAMIDOL (ISOVUE-M 200) INJECTION 41%
15.0000 mL | Freq: Once | INTRAMUSCULAR | Status: AC
  Administered 2022-02-13: 15 mL via INTRA_ARTICULAR

## 2022-02-15 ENCOUNTER — Telehealth: Payer: Self-pay | Admitting: Orthopedic Surgery

## 2022-02-15 ENCOUNTER — Other Ambulatory Visit: Payer: Self-pay | Admitting: Internal Medicine

## 2022-02-15 ENCOUNTER — Ambulatory Visit: Payer: Medicare HMO | Admitting: Orthopedic Surgery

## 2022-02-15 DIAGNOSIS — M75121 Complete rotator cuff tear or rupture of right shoulder, not specified as traumatic: Secondary | ICD-10-CM | POA: Diagnosis not present

## 2022-02-15 NOTE — Telephone Encounter (Signed)
Patient scheduled for right shoulder arthroscopy, debridement, mini open rotator cuff tear repair 03-07-22.  Patient requested the surgery be done at [facility].  He states he had an episode of chest pain a couple of weeks ago and went to the ER.  Patient said an EKG was done and the conclusion was anxiety. He mentions having lost his wife a year ago and is still grieving.  Please advise if any clearances are to be obtained prior to surgery.

## 2022-02-18 ENCOUNTER — Encounter: Payer: Self-pay | Admitting: Orthopedic Surgery

## 2022-02-18 NOTE — Progress Notes (Signed)
Office Visit Note   Patient: [name]           Date of Birth: 02-25-1966           MRN: [id number] Visit Date: 02/15/2022 Requested by: [name], MD [address] [place],  [place] [address] PCP: [name], MD  Subjective: Chief Complaint  Patient presents with   Other    Scan review    HPI: [name] is a 56 y.o. male who presents to the office reporting right shoulder pain.  Since he was last seen he has had an MRI scan of the right shoulder.  Had what sounds like arthroscopy and biceps tenodesis in the right shoulder about 15 years ago.  Felt a pop in his shoulder when he was dealing with a chair recently.  Prior to that he was doing well..                ROS: All systems reviewed are negative as they relate to the chief complaint within the history of present illness.  Patient denies fevers or chills.  Assessment & Plan: Visit Diagnoses:  1. Complete tear of right rotator cuff, unspecified whether traumatic     Plan: Impression is right shoulder rotator cuff tear involving infraspinatus and supraspinatus tendons with about 2 and half centimeters of retraction.  There is some atrophy of the infraspinatus muscle belly.  This may represent acute on chronic tear.  Plan at this time is shoulder arthroscopy with debridement and mini open rotator cuff tear repair of the infraspinatus and supraspinatus.  The risk benefits are discussed with the patient including not limited to infection or vessel damage incomplete pain relief as well as the chance of recurrent tearing.  He would need to use a black brace postoperatively for range of motion exercises but we would really limit any type of active or active assisted motion until 4 weeks postop.  Would likely keep him in the sling at least 3 to 4 weeks.  Patient understands risk benefits as well as the extensive rehabilitative process required.  All questions answered.  Follow-Up Instructions: No follow-ups  on file.   Orders:  No orders of the defined types were placed in this encounter.  No orders of the defined types were placed in this encounter.     Procedures: No procedures performed   Clinical Data: No additional findings.  Objective: Vital Signs: There were no vitals taken for this visit.  Physical Exam:  Constitutional: Patient appears well-developed HEENT:  Head: Normocephalic Eyes:EOM are normal Neck: Normal range of motion Cardiovascular: Normal rate Pulmonary/chest: Effort normal Neurologic: Patient is alert Skin: Skin is warm Psychiatric: Patient has normal mood and affect  Ortho Exam: Ortho exam demonstrates weakness to infraspinatus and supraspinatus testing on the right against resistance to 4- out of 5.  Subscap strength intact to belly press test on the right.  No discrete AC joint tenderness is present.  No Popeye deformity is present.  No other masses lymphadenopathy or skin changes noted in that shoulder girdle region.  Specialty Comments:  EXAM: MRI LUMBAR SPINE WITHOUT CONTRAST   TECHNIQUE: Multiplanar, multisequence MR imaging of the lumbar spine was performed. No intravenous contrast was administered.   COMPARISON:  MRI of the lumbar spine December 05 2018.   FINDINGS: Segmentation:  Standard.   Alignment:  Trace retrolisthesis at L2-3 and L3-4.   Vertebrae:  No fracture, evidence of discitis, or bone lesion.   Conus  medullaris and cauda equina: Conus extends to the L1-2 level. Conus and cauda equina appear normal.   Paraspinal and other soft tissues: Negative.   Disc levels:   T12-L1: No spinal canal or neural foraminal stenosis.   L1-2: No spinal canal or neural foraminal stenosis.   L2-3: Shallow disc bulge and mild bilateral facet joint effusion. No significant spinal canal or neural foraminal stenosis.   L3-4: Left subarticular to far lateral disc protrusion, moderate hypertrophic facet degenerative changes with trace bilateral  joint effusion resulting in mild narrowing of the left subarticular zone and mild left neural foraminal narrowing, unchanged.   L4-5: Disc bulge, moderate hypertrophic facet degenerative changes and mild ligamentum flavum redundancy resulting in mild-to-moderate right and mild left neural foraminal narrowing. No significant spinal canal stenosis. Findings have progressed since prior MRI.   L5-S1: Shallow disc bulge and mild facet degenerative changes without significant spinal canal or neural foraminal stenosis, unchanged.   IMPRESSION: 1. Progression of degenerative changes at L4-5 now with mild-to-moderate right and mild left neural. 2. Stable left subarticular to far lateral disc protrusion at L3-4 resulting in mild narrowing of the left subarticular zone and mild left neural foraminal narrowing.     Electronically Signed   By: Pedro Earls M.D.   On: 09/29/2021 17:20  Imaging: No results found.   PMFS History: Patient Active Problem List   Diagnosis Date Noted   Pernicious anemia 10/12/2021   Chronic pain associated with significant psychosocial dysfunction 03/05/2021   History of deep venous thrombosis (DVT) of distal vein of left lower extremity 03/05/2021   Arthritis of left knee    Osteoarthritis 02/10/2021   S/P TKR (total knee replacement), left 02/10/2021   Grief counseling 11/14/2020   Abnormal electrocardiogram (ECG) (EKG) 09/02/2020   Preoperative evaluation to rule out surgical contraindication 09/02/2020   MDD (major depressive disorder), recurrent episode, moderate ([place]) 05/29/2020   Colon cancer screening 02/02/2020   Eczema of both external ears 12/08/2019   1+ pitting edema 12/08/2019   Polyarthritis of multiple sites 10/27/2019   Coronary artery calcification seen on CT scan 10/27/2019   Radiculopathy, cervical region 06/10/2019   Carpal tunnel syndrome, left upper limb 06/10/2019   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   Spinal  stenosis of cervical region 12/31/2018   Loose body of left knee    Plantar fasciitis of left foot 08/24/2017   Chronic pain of both hips 06/20/2017   Complete rotator cuff tear of left shoulder 09/14/2015   Hypothyroidism [name] to acquired atrophy of thyroid 09/14/2015   Arthritis of knee 06/23/2014   Encounter for preventive health examination 06/17/2014   Enlarged RV (right ventricle) 06/17/2014   GERD (gastroesophageal reflux disease) 04/20/2014   Cervical spine degeneration 02/17/2014   Overweight 10/29/2013   Chronic knee pain 10/29/2013   Type 2 diabetes mellitus with diabetic neuropathy, unspecified ([place]) 10/23/2012   Seborrheic dermatitis, unspecified 05/17/2012   Fatty liver 04/22/2012   Hypogonadism male 03/12/2012   Chronic pain of multiple joints 12/24/2010   Sciatica of left side associated with disorder of lumbar spine 12/24/2010   Hyperlipidemia associated with type 2 diabetes mellitus ([place]) 01/13/2009   Anxiety state 01/13/2009   Essential hypertension 01/13/2009   OSA (obstructive sleep apnea) 01/13/2009   Past Medical History:  Diagnosis Date   Acute medial meniscus tear of left knee    Alcohol-induced chronic pancreatitis ([place]) 11/20/2016   Anxiety    Anxiety and depression    Arthritis    knees  and shoulders   Benzodiazepine withdrawal with complication ([place]) 22/05/5425   Bruit    L   Chest pain    hx   Cognitive complaints with normal neuropsychological exam 10/23/2016   Diabetes mellitus without complication (HCC)    "borderline", diet controlled, no meds, patient has lost 30 lbs   Edema    Fatty liver    GERD (gastroesophageal reflux disease)    uses Omeprazole   Goiter    HLD (hyperlipidemia)    Hypertension    essential, benign   Hypothyroidism    Impotence of organic origin    Murmur    never has caused any problems   Neuromuscular disorder (HCC)    Other chest pain    tightness, pressure   Palpitation    hx   Precordial pain    Sleep  apnea    uses cpap    Family History  Problem Relation Age of Onset   Heart disease Father    Colon cancer Neg Hx    Colon polyps Neg Hx    Esophageal cancer Neg Hx    Rectal cancer Neg Hx    Stomach cancer Neg Hx     Past Surgical History:  Procedure Laterality Date   APPENDECTOMY  2002   done at [facility] N/A 08/27/2015   Procedure: Left Heart Cath and Coronary Angiography;  Surgeon: Jolaine Artist, MD;  Location: [facility];  Service: Cardiovascular;  Laterality: N/A;   COLONOSCOPY  in his 20's   EAR CYST EXCISION Left 07/08/2019   Procedure: open excision baker's cyst left knee;  Surgeon: Meredith Pel, MD;  Location: [facility];  Service: Orthopedics;  Laterality: Left;   HERNIA REPAIR  0-62-37   umbilical   JOINT REPLACEMENT     right knee   KNEE ARTHROSCOPY Left 12/17/2018   Procedure: left knee arthroscopy, meniscal debridement, loose body removal;  Surgeon: Meredith Pel, MD;  Location: [facility];  Service: Orthopedics;  Laterality: Left;   REVISION TOTAL KNEE ARTHROPLASTY Right 06/23/2014   DR Deacon Gadbois   TONSILLECTOMY     TOTAL KNEE ARTHROPLASTY Right 2011   right   TOTAL KNEE ARTHROPLASTY Left 02/10/2021   Procedure: LEFT TOTAL KNEE ARTHROPLASTY;  Surgeon: Meredith Pel, MD;  Location: [facility];  Service: Orthopedics;  Laterality: Left;   TOTAL KNEE REVISION Right 06/23/2014   Procedure: TOTAL KNEE REVISION;  Surgeon: Meredith Pel, MD;  Location: [facility];  Service: Orthopedics;  Laterality: Right;   UPPER GASTROINTESTINAL ENDOSCOPY  04/30/2013   Judieth Keens TOOTH EXTRACTION     Social History   Occupational History   Occupation: Personnel officer: Public relations account executive   Occupation: SERVICE DIRECTOR    Employer: [employer]  Tobacco Use   Smoking status: Former    Packs/day: 2.00    Years: 18.00    Total pack years: 36.00    Types: Cigarettes    Quit  date: 04/24/2005    Years since quitting: 16.8   Smokeless tobacco: Never   Tobacco comments:    quit 20 years ago   Vaping Use   Vaping Use: Never used  Substance and Sexual Activity   Alcohol use: Not Currently    Alcohol/week: 4.0 standard drinks of alcohol    Types: 4 Cans of beer per week   Drug use: No   Sexual activity: Yes

## 2022-02-24 NOTE — Telephone Encounter (Signed)
Please see Dr. Randel Pigg response below. Let me know if there is anything that I can do to help with this.

## 2022-02-24 NOTE — Telephone Encounter (Signed)
[facility] for medical clearance - we can let them decide if card w/u needed thx

## 2022-02-27 ENCOUNTER — Encounter: Payer: Self-pay | Admitting: *Deleted

## 2022-02-27 NOTE — Pre-Procedure Instructions (Signed)
Surgical Instructions    Your procedure is scheduled on March 07, 2022.  Report to [facility] Main Entrance "A" at 12:15 P.M., then check in with the Admitting office.  Call this number if you have problems the morning of surgery:  [phone]   If you have any questions prior to your surgery date call [phone]: Open Monday-Friday 8am-4pm    Remember:  Do not eat after midnight the night before your surgery  You may drink clear liquids until 11:15 AM the morning of your surgery.   Clear liquids allowed are: Water, Non-Citrus Juices (without pulp), Carbonated Beverages, Clear Tea, Black Coffee Only (NO MILK, CREAM OR POWDERED CREAMER of any kind), and Gatorade.  Patient Instructions  The night before surgery:  No food after midnight. ONLY clear liquids after midnigh.  The day of surgery (if you have diabetes): Drink ONE (1) 12 oz G2 given to you in your pre admission testing appointment by 11:15 AM the morning of surgery. Drink in one sitting. Do not sip.  This drink was given to you during your hospital  pre-op appointment visit.  Nothing else to drink after completing the  12 oz bottle of G2.         If you have questions, please contact your surgeon's office.     Take these medicines the morning of surgery with A SIP OF WATER:  atorvastatin (LIPITOR)   buPROPion (WELLBUTRIN XL)   busPIRone (BUSPAR)   carvedilol (COREG)   dicyclomine (BENTYL)   ezetimibe (ZETIA)   gabapentin (NEURONTIN)   levothyroxine (SYNTHROID)   omeprazole (PRILOSEC)    Take these medicines the morning of surgery with a sip of water AS NEEDED:  acetaminophen (TYLENOL)   clonazePAM (KLONOPIN)   HYDROcodone-acetaminophen (NORCO)   methocarbamol (ROBAXIN)   naloxone (NARCAN) nasal spray   promethazine (PHENERGAN)    As of today, STOP taking any Aspirin (unless otherwise instructed by your surgeon) Aleve, Naproxen, Ibuprofen, Motrin, Advil, Goody's, BC's, all herbal medications, fish oil,  and all vitamins.           WHAT DO I DO ABOUT MY DIABETES MEDICATION?  Do not take metFORMIN (GLUCOPHAGE) the morning of surgery.   HOW TO MANAGE YOUR DIABETES BEFORE AND AFTER SURGERY  Why is it important to control my blood sugar before and after surgery? Improving blood sugar levels before and after surgery helps healing and can limit problems. A way of improving blood sugar control is eating a healthy diet by:  Eating less sugar and carbohydrates  Increasing activity/exercise  Talking with your doctor about reaching your blood sugar goals High blood sugars (greater than 180 mg/dL) can raise your risk of infections and slow your recovery, so you will need to focus on controlling your diabetes during the weeks before surgery. Make sure that the doctor who takes care of your diabetes knows about your planned surgery including the date and location.  How do I manage my blood sugar before surgery? Check your blood sugar at least 4 times a day, starting 2 days before surgery, to make sure that the level is not too high or low.  Check your blood sugar the morning of your surgery when you wake up and every 2 hours until you get to the Short Stay unit.  If your blood sugar is less than 70 mg/dL, you will need to treat for low blood sugar: Do not take insulin. Treat a low blood sugar (less than 70 mg/dL) with  cup of clear  juice (cranberry or apple), 4 glucose tablets, OR glucose gel. Recheck blood sugar in 15 minutes after treatment (to make sure it is greater than 70 mg/dL). If your blood sugar is not greater than 70 mg/dL on recheck, call [phone] for further instructions. Report your blood sugar to the short stay nurse when you get to Short Stay.  If you are admitted to the hospital after surgery: Your blood sugar will be checked by the staff and you will probably be given insulin after surgery (instead of oral diabetes medicines) to make sure you have good blood sugar  levels. The goal for blood sugar control after surgery is 80-180 mg/dL.             Do NOT Smoke (Tobacco/Vaping) for 24 hours prior to your procedure.  If you use a CPAP at night, you may bring your mask/headgear for your overnight stay.   Contacts, glasses, piercing's, hearing aid's, dentures or partials may not be worn into surgery, please bring cases for these belongings.    For patients admitted to the hospital, discharge time will be determined by your treatment team.   Patients discharged the day of surgery will not be allowed to drive home, and someone needs to stay with them for 24 hours.  SURGICAL WAITING ROOM VISITATION Patients having surgery or a procedure may have no more than 2 support people in the waiting area - these visitors may rotate.   Children under the age of 38 must have an adult with them who is not the patient. If the patient needs to stay at the hospital during part of their recovery, the visitor guidelines for inpatient rooms apply. Pre-op nurse will coordinate an appropriate time for 1 support person to accompany patient in pre-op.  This support person may not rotate.   Please refer to the [facility] website for the visitor guidelines for Inpatients (after your surgery is over and you are in a regular room).    Oral Hygiene is also important to reduce your risk of infection.  Remember - BRUSH YOUR TEETH THE MORNING OF SURGERY WITH YOUR REGULAR TOOTHPASTE  [place]- Preparing for Total Shoulder Arthroplasty  Before surgery, you can play an important role. Because skin is not sterile, your skin needs to be as free of germs as possible. You can reduce the number of germs on your skin by using the following products.   Benzoyl Peroxide Gel  o Reduces the number of germs present on the skin  o Applied twice a day to shoulder area starting two days before surgery   Chlorhexidine Gluconate (CHG) Soap (instructions listed above on how to wash with CHG  Soap)  o An antiseptic cleaner that kills germs and bonds with the skin to continue killing germs even after washing  o Used for showering the night before surgery and morning of surgery   ==================================================================  Please follow these instructions carefully:  BENZOYL PEROXIDE 5% GEL  Please do not use if you have an allergy to benzoyl peroxide. If your skin becomes reddened/irritated stop using the benzoyl peroxide.  Starting two days before surgery, apply as follows:  1. Apply benzoyl peroxide in the morning and at night. Apply after taking a shower. If you are not taking a shower clean entire shoulder front, back, and side along with the armpit with a clean wet washcloth.  2. Place a quarter-sized dollop on your SHOULDER and rub in thoroughly, making sure to cover the front, back, and side of your  shoulder, along with the armpit.   2 Days prior to Surgery First Dose on November 12th Morning Second Dose on November 12th Night  Day Before Surgery First Dose on November 13th Morning Night before surgery wash (entire body except face and private areas) with CHG Soap THEN Second Dose on November 13th Night   Morning of Surgery  wash BODY AGAIN with CHG Soap   4. Do NOT apply benzoyl peroxide gel on the day of surgery   [facility]- Preparing For Surgery  Before surgery, you can play an important role. Because skin is not sterile, your skin needs to be as free of germs as possible. You can reduce the number of germs on your skin by washing with CHG (chlorahexidine gluconate) Soap before surgery.  CHG is an antiseptic cleaner which kills germs and bonds with the skin to continue killing germs even after washing.     Please do not use if you have an allergy to CHG or antibacterial soaps. If your skin becomes reddened/irritated stop using the CHG.  Do not shave (including legs and underarms) for at least 48 hours prior to first CHG shower.  It is OK to shave your face.  Please follow these instructions carefully.     Shower the NIGHT BEFORE SURGERY and the MORNING OF SURGERY with CHG Soap.   If you chose to wash your hair, wash your hair first as usual with your normal shampoo. After you shampoo, rinse your hair and body thoroughly to remove the shampoo.  Then [employer] and genitals (private parts) with your normal soap and rinse thoroughly to remove soap.  After that Use CHG Soap as you would any other liquid soap. You can apply CHG directly to the skin and wash gently with a scrungie or a clean washcloth.   Apply the CHG Soap to your body ONLY FROM THE NECK DOWN.  Do not use on open wounds or open sores. Avoid contact with your eyes, ears, mouth and genitals (private parts). Wash Face and genitals (private parts)  with your normal soap.   Wash thoroughly, paying special attention to the area where your surgery will be performed.  Thoroughly rinse your body with warm water from the neck down.  DO NOT shower/wash with your normal soap after using and rinsing off the CHG Soap.  Pat yourself dry with a CLEAN TOWEL.  8. Apply the Benzoyl Peroxide only the night before surgery.  Do Not use it the morning of surgery.  Wear CLEAN PAJAMAS to bed the night before surgery  Place CLEAN SHEETS on your bed the night before your surgery  DO NOT SLEEP WITH PETS.   Day of Surgery: Take a shower with CHG soap. Do not wear jewelry or makeup Do not wear lotions, powders, perfumes/colognes, or deodorant. Do not shave 48 hours prior to surgery.  Men may shave face and neck. Do not bring valuables to the hospital.  [facility] is not responsible for any belongings or valuables. Do not wear nail polish, gel polish, artificial nails, or any other type of covering on natural nails (fingers and toes) If you have artificial nails or gel coating that need to be removed by a nail salon, please have this removed prior to surgery. Artificial  nails or gel coating may interfere with anesthesia's ability to adequately monitor your vital signs.  Wear Clean/Comfortable clothing the morning of surgery Remember to brush your teeth WITH YOUR REGULAR TOOTHPASTE.   Please read over the following  fact sheets that you were given.    If you received a COVID test during your pre-op visit  it is requested that you wear a mask when out in public, stay away from anyone that may not be feeling well and notify your surgeon if you develop symptoms. If you have been in contact with anyone that has tested positive in the last 10 days please notify you surgeon.

## 2022-02-28 ENCOUNTER — Encounter (HOSPITAL_COMMUNITY): Payer: Self-pay

## 2022-02-28 ENCOUNTER — Encounter: Payer: Self-pay | Admitting: Internal Medicine

## 2022-02-28 ENCOUNTER — Telehealth: Payer: Self-pay | Admitting: Internal Medicine

## 2022-02-28 ENCOUNTER — Encounter (HOSPITAL_COMMUNITY)
Admission: RE | Admit: 2022-02-28 | Discharge: 2022-02-28 | Disposition: A | Discharge status: Home / Self Care | Payer: Medicare HMO | Source: Ambulatory Visit | Attending: Orthopedic Surgery | Admitting: Orthopedic Surgery

## 2022-02-28 ENCOUNTER — Other Ambulatory Visit: Payer: Self-pay

## 2022-02-28 VITALS — BP 146/81 | HR 66 | Temp 97.7°F | Resp 18 | Ht 72.0 in | Wt 233.2 lb

## 2022-02-28 DIAGNOSIS — E119 Type 2 diabetes mellitus without complications: Secondary | ICD-10-CM | POA: Insufficient documentation

## 2022-02-28 DIAGNOSIS — Z01812 Encounter for preprocedural laboratory examination: Secondary | ICD-10-CM | POA: Insufficient documentation

## 2022-02-28 DIAGNOSIS — Z01818 Encounter for other preprocedural examination: Secondary | ICD-10-CM

## 2022-02-28 LAB — CBC
HCT: 41 % (ref 39.0–52.0)
Hemoglobin: 13.9 g/dL (ref 13.0–17.0)
MCH: 32.9 pg (ref 26.0–34.0)
MCHC: 33.9 g/dL (ref 30.0–36.0)
MCV: 96.9 fL (ref 80.0–100.0)
Platelets: 261 10*3/uL (ref 150–400)
RBC: 4.23 MIL/uL (ref 4.22–5.81)
RDW: 12.1 % (ref 11.5–15.5)
WBC: 6.2 10*3/uL (ref 4.0–10.5)
nRBC: 0 % (ref 0.0–0.2)

## 2022-02-28 LAB — BASIC METABOLIC PANEL
Anion gap: 7 (ref 5–15)
BUN: 14 mg/dL (ref 6–20)
CO2: 29 mmol/L (ref 22–32)
Calcium: 9.1 mg/dL (ref 8.9–10.3)
Chloride: 103 mmol/L (ref 98–111)
Creatinine, Ser: 1 mg/dL (ref 0.61–1.24)
GFR, Estimated: >60 mL/min (ref >60)
Glucose, Bld: 133 mg/dL — ABNORMAL HIGH (ref 70–99)
Potassium: 4.4 mmol/L (ref 3.5–5.1)
Sodium: 139 mmol/L (ref 135–145)

## 2022-02-28 LAB — SURGICAL PCR SCREEN
MRSA, PCR: NEGATIVE
Staphylococcus aureus: POSITIVE — AB

## 2022-02-28 LAB — GLUCOSE, CAPILLARY: Glucose-Capillary: 176 mg/dL — ABNORMAL HIGH (ref 70–99)

## 2022-02-28 NOTE — Telephone Encounter (Signed)
Patient is have surgery on his shoulder on 03/07/2022. He needs medical Clearance from his provider. No appointments are available before the 14th.

## 2022-02-28 NOTE — Telephone Encounter (Signed)
There is no appts with you available before then he can get clearance by someone else in our office?

## 2022-02-28 NOTE — Progress Notes (Signed)
PCP - Dr. Helene Kelp L. [place] Cardiologist - Dr. Stanford Breed was MD but he is switching to Dr. Rockey Situ because its closer to his house.  PPM/ICD - Denies Device Orders - n/a Rep Notified - n/a  Chest x-ray - 02/01/2022 EKG - 02/02/2022 Stress Test - Denies ECHO - 10/04/2020 Cardiac Cath - 08/27/2015 - negative per patient. Work-up for CP  Sleep Study - Yes. Positive sleep study many years ago. CPAP - CPAP is currently broken and he is waiting for replacement to arrive. But he wears it nightly when available. Not aware of pressure settings  Pt is DM2. He checks his blood sugar 2x/day. Normal fasting range 90-100. CBG at PAT appointment 176. He had no breakfast, 2 packs of Nabs and water so far today. Last A1c on 01/16/2022 was 6.8  Last dose of GLP1 agonist- n/a GLP1 instructions: n/a  Blood Thinner Instructions: n/a  Aspirin Instructions: n/a  ERAS Protcol - Clear liquids until 0930 morning of surgery PRE-SURGERY Ensure or G2- G2 given to patient at PAT appointment  COVID TEST- n/a   Anesthesia review: Yes. Per Dr. Randel Pigg note, he wants patient to receive Medical Clearance prior to surgery. Dr. Marlou Sa will then leave it up to PCP to determine if he will also need cardiac clearance. RN attempted to make medical clearance appointment with pt during PAT visit, but unable. Office rep sent a message to MD with information. RN instructed pt to reach out to PCP tomorrow if he does not hear back to try and get clearance in time prior to his surgery.   Patient denies shortness of breath, fever, cough and chest pain at PAT appointment   All instructions explained to the patient, with a verbal understanding of the material. Patient agrees to go over the instructions while at home for a better understanding. Patient also instructed to self quarantine after being tested for COVID-19. The opportunity to ask questions was provided.

## 2022-03-01 NOTE — Telephone Encounter (Signed)
Pt has been scheduled with Dr. Volanda Napoleon tomorrow at 3:40. Pt is aware of appt date and time.

## 2022-03-01 NOTE — Telephone Encounter (Signed)
Pt scheduled tomorrow with Dr. Volanda Napoleon.

## 2022-03-01 NOTE — Progress Notes (Signed)
Anesthesia Chart Review:  Follows with cardiology for hx of HTN, HLD, OSA on CPAP, nonobstructive CAD, DVT (09/2020).  Cath 5/17 showed 30 Lcx. Cardiac CTA 10/20 showed Ca score 50, mild (<50) D1, mild (<50) Lcx and minimal RCA. Echocardiogram June 2022 showed normal LV function, mild right atrial enlargement. Last seen by Dr. Stanford [name] 12/14/21. At that time his bp was mildly elevated and he reported a few months of BLE edema which was felt to correlate with initiation of amlodipine. Amlodipine and metoprolol were discontinued and he was started on carvedilol 12.5 mg daily.  Preop labs reviewed, WNL.  EKG 02/01/22: Normal sinus rhythm. Rate 90. Right superior axis deviation. Incomplete right bundle branch block. Right ventricular hypertrophy with repolarization abnormality  TTE 10/04/20:  1. Left ventricular ejection fraction, by estimation, is 65 to 70%. The  left ventricle has normal function. The left ventricle has no regional  wall motion abnormalities. Left ventricular diastolic parameters are  consistent with Grade II diastolic  dysfunction (pseudonormalization).   2. Right ventricular systolic function is normal. The right ventricular  size is normal. There is normal pulmonary artery systolic pressure.   3. Right atrial size was mildly dilated.   4. The mitral valve is normal in structure. Trivial mitral valve  regurgitation.   5. The aortic valve is normal in structure. Aortic valve regurgitation is  trivial.   Coronary CT 02/17/19: IMPRESSION: 1. Coronary artery calcium score 50 Agatston units. This places the patient in the 82nd percentile for age and gender, suggesting high risk for future cardiac events.   2.  Nonobstructive disease in the LAD and RCA systems.   3. Misregistration artifact makes interpretation of the mid LCx difficult. I suspect mild stenosis in the mid LCx. Due to misregistration, I do not think that FFR would be successful on this study.  Cath  08/27/2015: Assessment & Plan:   1. Minimal CAD with 30% mid LCX lesion.   [name] [facility] Stay [facility]/Anesthesiology Phone [phone] 03/14/2022 2:02 PM

## 2022-03-02 ENCOUNTER — Ambulatory Visit (INDEPENDENT_AMBULATORY_CARE_PROVIDER_SITE_OTHER): Payer: Medicare HMO | Admitting: Family Medicine

## 2022-03-02 ENCOUNTER — Encounter: Payer: Self-pay | Admitting: Family Medicine

## 2022-03-02 VITALS — BP 130/82 | HR 93 | Temp 98.4°F | Ht 72.0 in | Wt 230.6 lb

## 2022-03-02 DIAGNOSIS — Z01818 Encounter for other preprocedural examination: Secondary | ICD-10-CM | POA: Diagnosis not present

## 2022-03-02 NOTE — Progress Notes (Signed)
   SUBJECTIVE:   Chief Complaint  Patient presents with   Acute Visit    Surgical Clearance   HPI  Patient presents to clinic for surgical clearance.  Patient reports having plan for elective right shoulder surgery and requesting surgical clearance.  Surgery is planned for 11/14.  Recently had chest pain was evaluated in the ED on 02/01/2022.  Chest x-ray and EKG were unremarkable at that time.  Low suspicion for ACS or myocarditis at that time.  Was suspected to have some anxiety contributing to his symptoms.  Cardiology referral had been placed for follow-up given patient's presentation of two isolated episodes of crushing central chest pain lasting approximately 1 hour.   Per chart review patient was previously followed by Dr. Ramon Dredge in 2017.  Currently followed by Dr. Stanford Breed.   PERTINENT PMH / PSH: Hypertension CAD Acute DVT 11/22 on Eliquis  OBJECTIVE:  BP 130/82 (BP Location: Left Arm, Patient Position: Sitting, Cuff Size: Large)   Pulse 93   Temp 98.4 F (36.9 C) (Oral)   Ht 6' (1.829 m)   Wt 230 lb 9.6 oz (104.6 kg)   SpO2 95%   BMI 31.27 kg/m    Physical Exam Vitals reviewed.  Eyes:     Conjunctiva/sclera: Conjunctivae normal.  Cardiovascular:     Rate and Rhythm: Normal rate.  Pulmonary:     Effort: Pulmonary effort is normal.  Neurological:     Mental Status: He is alert. Mental status is at baseline.     ASSESSMENT/PLAN:  Preoperative evaluation to rule out surgical contraindication Assessment & Plan: Recommend patient to be seen by cardiologist for medical clearance given multiple risk factors for CAD.    PDMP reviewed  Return if symptoms worsen or fail to improve.  Carollee Leitz, MD

## 2022-03-02 NOTE — Telephone Encounter (Signed)
Patient is scheduled for right shoulder arthroscopy, debridement, mini open rotator cuff tear repair 03-07-22 at [facility]  Patient would like to know if he will be prescribed out patient physical therapy.  If so, he would like to do the sessions at [facility] in [place], [place] (the [facility] location) Phone: [phone]  Fax: [phone].

## 2022-03-02 NOTE — Telephone Encounter (Signed)
Ok to do pt there but will not start til 2 -3 wks post op

## 2022-03-02 NOTE — Patient Instructions (Addendum)
It was a pleasure meeting you today. Thank you for allowing me to take part in your health care.  Our goals for today as we discussed include:  I'm sorry I cannot complete your surgical clearance.  You will need to contact your Orthopedic surgeon to have them fax the surgical information to your Cardiologist for your medical clearance.  Your blood pressure is high today Please discuss this with your PCP at your next visit   Please follow-up with PCP as scheduled  If you have any questions or concerns, please do not hesitate to call the office at [phone].  I look forward to our next visit and until then take care and stay safe.  Regards,   Carollee Leitz, MD   [facility]

## 2022-03-03 ENCOUNTER — Ambulatory Visit (INDEPENDENT_AMBULATORY_CARE_PROVIDER_SITE_OTHER): Payer: Medicare HMO

## 2022-03-03 ENCOUNTER — Encounter: Payer: Self-pay | Admitting: Nurse Practitioner

## 2022-03-03 ENCOUNTER — Telehealth: Payer: Self-pay | Admitting: Cardiology

## 2022-03-03 ENCOUNTER — Telehealth: Payer: Self-pay | Admitting: *Deleted

## 2022-03-03 ENCOUNTER — Ambulatory Visit: Payer: Medicare HMO | Attending: Nurse Practitioner | Admitting: Nurse Practitioner

## 2022-03-03 DIAGNOSIS — E538 Deficiency of other specified B group vitamins: Secondary | ICD-10-CM | POA: Diagnosis not present

## 2022-03-03 DIAGNOSIS — Z01810 Encounter for preprocedural cardiovascular examination: Secondary | ICD-10-CM | POA: Diagnosis not present

## 2022-03-03 MED ORDER — CYANOCOBALAMIN 1000 MCG/ML IJ SOLN
1000.0000 ug | Freq: Once | INTRAMUSCULAR | Status: AC
  Administered 2022-03-03: 1000 ug via INTRAMUSCULAR

## 2022-03-03 NOTE — Anesthesia Preprocedure Evaluation (Signed)
Anesthesia Evaluation  Patient identified by MRN, date of birth, ID band Patient awake    Reviewed: Allergy & Precautions, NPO status , Patient's Chart, lab work & pertinent test results, reviewed documented beta blocker date and time   Airway Mallampati: IV  TM Distance: >3 FB Neck ROM: Full    Dental  (+) Teeth Intact, Dental Advisory Given   Pulmonary sleep apnea (CPAP currently broken) , former smoker Quit smoking 2007, 36 pack year history    Pulmonary exam normal breath sounds clear to auscultation       Cardiovascular hypertension (186/97), Pt. on medications and Pt. on home beta blockers Normal cardiovascular exam Rhythm:Regular Rate:Normal  EKG 02/01/22: Normal sinus rhythm. Rate 90. Right superior axis deviation. Incomplete right bundle branch block. Right ventricular hypertrophy with repolarization abnormality   Cath 2017 Minimal CAD with 30% mid LCX lesion.    Echo 2022 1. Left ventricular ejection fraction, by estimation, is 65 to 70%. The  left ventricle has normal function. The left ventricle has no regional  wall motion abnormalities. Left ventricular diastolic parameters are  consistent with Grade II diastolic  dysfunction (pseudonormalization).   2. Right ventricular systolic function is normal. The right ventricular  size is normal. There is normal pulmonary artery systolic pressure.   3. Right atrial size was mildly dilated.   4. The mitral valve is normal in structure. Trivial mitral valve  regurgitation.   5. The aortic valve is normal in structure. Aortic valve regurgitation is  trivial.      Neuro/Psych  PSYCHIATRIC DISORDERS Anxiety Depression       GI/Hepatic Neg liver ROS,GERD  Medicated and Controlled,,  Endo/Other  diabetes, Well Controlled, Type 2, Oral Hypoglycemic AgentsHypothyroidism  A1c 6.8  Renal/GU negative Renal ROS  negative genitourinary   Musculoskeletal  (+) Arthritis ,  Osteoarthritis,    Abdominal  (+) + obese  Peds  Hematology negative hematology ROS (+) Hb 13.9   Anesthesia Other Findings   Reproductive/Obstetrics negative OB ROS                             Anesthesia Physical Anesthesia Plan  ASA: 3  Anesthesia Plan: General and Regional   Post-op Pain Management: Regional block* and Tylenol PO (pre-op)*   Induction: Intravenous  PONV Risk Score and Plan: 2 and Ondansetron, Dexamethasone, Midazolam and Treatment may vary due to age or medical condition  Airway Management Planned: Oral ETT and Video Laryngoscope Planned  Additional Equipment: None  Intra-op Plan:   Post-operative Plan: Extubation in OR  Informed Consent: I have reviewed the patients History and Physical, chart, labs and discussed the procedure including the risks, benefits and alternatives for the proposed anesthesia with the patient or authorized representative who has indicated his/her understanding and acceptance.     Dental advisory given  Plan Discussed with: CRNA  Anesthesia Plan Comments: (   )        Anesthesia Quick Evaluation

## 2022-03-03 NOTE — Telephone Encounter (Signed)
Pt has been added on today at 1:20 per Christen Bame, NP pre op provider today.   Med rec and consent are done.     Patient Consent for Virtual Visit        [name] has provided verbal consent on 03/03/2022 for a virtual visit (video or telephone).   CONSENT FOR VIRTUAL VISIT FOR:  [name]  By participating in this virtual visit I agree to the following:  I hereby voluntarily request, consent and authorize [facility] and its employed or contracted physicians, physician assistants, nurse practitioners or other licensed health care professionals (the Practitioner), to provide me with telemedicine health care services (the "Services") as deemed necessary by the treating Practitioner. I acknowledge and consent to receive the Services by the Practitioner via telemedicine. I understand that the telemedicine visit will involve communicating with the Practitioner through live audiovisual communication technology and the disclosure of certain medical information by electronic transmission. I acknowledge that I have been given the opportunity to request an in-person assessment or other available alternative prior to the telemedicine visit and am voluntarily participating in the telemedicine visit.  I understand that I have the right to withhold or withdraw my consent to the use of telemedicine in the course of my care at any time, without affecting my right to future care or treatment, and that the Practitioner or I may terminate the telemedicine visit at any time. I understand that I have the right to inspect all information obtained and/or recorded in the course of the telemedicine visit and may receive copies of available information for a reasonable fee.  I understand that some of the potential risks of receiving the Services via telemedicine include:  Delay or interruption in medical evaluation due to technological equipment failure or disruption; Information transmitted  may not be sufficient (e.g. poor resolution of images) to allow for appropriate medical decision making by the Practitioner; and/or  In rare instances, security protocols could fail, causing a breach of personal health information.  Furthermore, I acknowledge that it is my responsibility to provide information about my medical history, conditions and care that is complete and accurate to the best of my ability. I acknowledge that Practitioner's advice, recommendations, and/or decision may be based on factors not within their control, such as incomplete or inaccurate data provided by me or distortions of diagnostic images or specimens that may result from electronic transmissions. I understand that the practice of medicine is not an exact science and that Practitioner makes no warranties or guarantees regarding treatment outcomes. I acknowledge that a copy of this consent can be made available to me via my patient portal ([facility]), or I can request a printed copy by calling the office of [facility].    I understand that my insurance will be billed for this visit.   I have read or had this consent read to me. I understand the contents of this consent, which adequately explains the benefits and risks of the Services being provided via telemedicine.  I have been provided ample opportunity to ask questions regarding this consent and the Services and have had my questions answered to my satisfaction. I give my informed consent for the services to be provided through the use of telemedicine in my medical care

## 2022-03-03 NOTE — Telephone Encounter (Signed)
Pt has been added on today at 1:20 per Christen Bame, NP pre op provider today.    Med rec and consent are done.

## 2022-03-03 NOTE — Progress Notes (Addendum)
Virtual Visit via Telephone Note   Because of [name]'s co-morbid illnesses, he is at least at moderate risk for complications without adequate follow up.  This format is felt to be most appropriate for this patient at this time.  The patient did not have access to video technology/had technical difficulties with video requiring transitioning to audio format only (telephone).  All issues noted in this document were discussed and addressed.  No physical exam could be performed with this format.  Please refer to the patient's chart for his consent to telehealth for [facility].  Evaluation Performed:  Preoperative cardiovascular risk assessment _____________   Date:  03/03/2022   Patient ID:  [name], DOB 12-08-1965, MRN [id number] Patient Location:  Home Provider location:   Office  Primary Care Provider:  [name], MD Primary Cardiologist:  Ida Rogue, MD  Chief Complaint / Patient Profile   56 y.o. y/o male with a h/o CAD with cardiac cath that showed 30% LCx 2017 and coronary CTA 01/2019 showed calcium score 50, mild (<50) d1, mild (<50) LCx and minimal RCA, acute DVT 02/2021, OSA, HLD, and hypertension who is pending right shoulder arthroscopy and rotator cuff repair and presents today for telephonic preoperative cardiovascular risk assessment.  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   Anxiety    Anxiety and depression    Arthritis    knees and shoulders   Benzodiazepine withdrawal with complication ([place]) 15/72/6203   Bruit    L   Chest pain    hx   Cognitive complaints with normal neuropsychological exam 10/23/2016   Diabetes mellitus without complication (HCC)    "borderline", diet controlled, no meds, patient has lost 30 lbs   Edema    Fatty liver    GERD (gastroesophageal reflux disease)    uses Omeprazole    Goiter    HLD (hyperlipidemia)    Hypertension    essential, benign   Hypothyroidism    Impotence of organic origin    Murmur    never has caused any problems   Neuromuscular disorder (HCC)    neuropathy bilateral feet   Other chest pain    tightness, pressure   Palpitation    hx   Precordial pain    Sleep apnea    uses cpap   Past Surgical History:  Procedure Laterality Date   APPENDECTOMY  2002   done at [facility] N/A 08/27/2015   Procedure: Left Heart Cath and Coronary Angiography;  Surgeon: Jolaine Artist, MD;  Location: [facility];  Service: Cardiovascular;  Laterality: N/A;   COLONOSCOPY  in his 20's   COLONOSCOPY  2019   EAR CYST EXCISION Left 07/08/2019   Procedure: open excision baker's cyst left knee;  Surgeon: Meredith Pel, MD;  Location: [facility];  Service: Orthopedics;  Laterality: Left;   HERNIA REPAIR  55/97/4163   umbilical   JOINT REPLACEMENT     right knee   KNEE ARTHROSCOPY Left 12/17/2018   Procedure: left knee arthroscopy, meniscal debridement, loose body removal;  Surgeon: Meredith Pel, MD;  Location: [facility];  Service: Orthopedics;  Laterality: Left;   REVISION TOTAL KNEE ARTHROPLASTY Right 06/23/2014   DR Marlou Sa   TONSILLECTOMY     TOTAL KNEE ARTHROPLASTY Right 2011  right   TOTAL KNEE ARTHROPLASTY Left 02/10/2021   Procedure: LEFT TOTAL KNEE ARTHROPLASTY;  Surgeon: Meredith Pel, MD;  Location: [facility];  Service: Orthopedics;  Laterality: Left;   TOTAL KNEE REVISION Right 06/23/2014   Procedure: TOTAL KNEE REVISION;  Surgeon: Meredith Pel, MD;  Location: [facility];  Service: Orthopedics;  Laterality: Right;   UPPER GASTROINTESTINAL ENDOSCOPY  04/30/2013   Judieth Keens TOOTH EXTRACTION      Allergies  Allergies  Allergen Reactions   Ambien [Zolpidem] Swelling and Other (See Comments)    Swelling in the throat    History of Present Illness    [name] is a 55 y.o. male who presents via audio/video conferencing for a telehealth visit today.  Pt was last seen in cardiology clinic on 12/14/2021 by Dr. Stanford Breed.  At that time WILKIN LIPPY was doing well.  Amlodipine due to LE edema was discontinued and metoprolol was changed to carvedilol 12.5 twice daily the patient is now pending procedure as outlined above. Since his last visit, he  denies chest pain, shortness of breath, lower extremity edema, fatigue, palpitations, melena, hematuria, hemoptysis, diaphoresis, weakness, presyncope, syncope, orthopnea, and PND.  He remains active walking on a consistent basis and doing housework and yard work. Seen by PCP on 11/9 at which time 130/80. Reports leg edema has resolved since stopping amlodipine.   Home Medications    Prior to Admission medications   Medication Sig Start Date End Date Taking? Authorizing Provider  acetaminophen (TYLENOL) 325 MG tablet Take 1-2 tablets (325-650 mg total) by mouth every 6 (six) hours as needed for mild pain (pain score 1-3 or temp > 100.5). 02/12/21   Meredith Pel, MD  ARIPiprazole (ABILIFY) 5 MG tablet Take one tab at bed time 01/06/22   Arfeen, Arlyce Harman, MD  atorvastatin (LIPITOR) 80 MG tablet Take 1 tablet (80 mg total) by mouth daily. 09/30/21   [name], MD  Blood Glucose Monitoring Suppl (ACCU-CHEK AVIVA PLUS) w/Device KIT Use to check blood sugars up to 4 times daily. ICD-10 E11.40 10/04/21   [name], MD  buPROPion (WELLBUTRIN XL) 300 MG 24 hr tablet Take 1 tablet (300 mg total) by mouth daily. 01/06/22   Arfeen, Arlyce Harman, MD  busPIRone (BUSPAR) 15 MG tablet Take 1 tablet (15 mg total) by mouth 3 (three) times daily. 01/23/22   [name], MD  carvedilol (COREG) 12.5 MG tablet Take 1 tablet (12.5 mg total) by mouth 2 (two) times daily. 12/14/21   Lelon Perla, MD  clobetasol (TEMOVATE) 0.05 % external solution Apply 1 application  topically 2 (two) times daily as needed (scalp irritation).     [provider]  Clobetasol Propionate 0.05 % shampoo Apply to dry scalp and leave on for 15 minutes Patient taking differently: Apply 1 Application topically daily as needed (scalp irritation). 06/05/16   [name], MD  clonazePAM (KLONOPIN) 2 MG tablet Take 0.5 tablets (1 mg total) by mouth 2 (two) times daily as needed for anxiety. 01/16/22   [name], MD  dicyclomine (BENTYL) 20 MG tablet TAKE 1 TABLET BY MOUTH EVERY 6 HOURS 02/09/22   [name], MD  empagliflozin (JARDIANCE) 10 MG TABS tablet Take 1 tablet (10 mg total) by mouth daily. 01/16/22   [name], MD  ezetimibe (ZETIA) 10 MG tablet Take 1 tablet by mouth once daily 02/03/22   [name], MD  furosemide (LASIX) 20 MG tablet Take 1 tablet (20 mg total) by mouth 2 (two) times a week. 10/03/21   [name], MD  gabapentin (NEURONTIN) 600 MG tablet TAKE 1 TABLET BY MOUTH THREE TIMES DAILY 12/13/21   [name], MD  glucose blood (ACCU-CHEK AVIVA PLUS) test strip Use to check blood sugars up to 4 times daily. ICD-10 E11.40 01/17/22   [name], MD  glucose blood test strip Use once daily to check BS at various times  E11.9 12/08/19   [name], MD  HYDROcodone-acetaminophen (NORCO) 10-325 MG tablet Take 1 tablet by mouth 2 (two) times daily as needed. 01/17/22   [name], MD  Lancets (ACCU-CHEK MULTICLIX) lancets Use to to check blood sugars up to 4 times daily. ICD-10: E11.40 10/04/21   [name], MD  levothyroxine (SYNTHROID) 88 MCG tablet TAKE 1 TABLET BY MOUTH ONCE DAILY BEFORE BREAKFAST 10/31/21   [name], MD  losartan (COZAAR) 100 MG tablet Take 1 tablet by mouth once daily 02/15/22   [name], MD  metFORMIN (GLUCOPHAGE) 500 MG tablet TAKE 1 TABLET BY MOUTH TWICE DAILY WITH A MEAL 01/09/22   [name], MD  methocarbamol (ROBAXIN) 500 MG tablet Take 1 tablet (500 mg total) by mouth every 8 (eight) hours as needed. 04/26/21   [name], MD   Multiple Vitamin (MULTIVITAMIN WITH MINERALS) TABS tablet Take 1 tablet by mouth daily.    [provider]  naloxone [facility]) nasal spray 4 mg/0.1 mL Use as needed for excessive sedation /opioid overdose 09/30/21   [name], MD  omeprazole (PRILOSEC) 40 MG capsule TAKE 1 CAPSULE BY MOUTH IN THE MORNING AND AT BEDTIME 12/13/21   [name], MD  promethazine (PHENERGAN) 12.5 MG tablet Take 1 tablet (12.5 mg total) by mouth every 4 (four) hours as needed for nausea or vomiting. Patient taking differently: Take 12.5 mg by mouth every 8 (eight) hours as needed for nausea or vomiting. 09/09/20   [name], MD  rivaroxaban (XARELTO) 20 MG TABS tablet Take 1 tablet (20 mg total) by mouth daily with supper. Patient not taking: Reported on 03/03/2022 12/06/21   [name], MD  traZODone (DESYREL) 100 MG tablet TAKE 1 & 1/2 (ONE & ONE-HALF) TABLETS BY MOUTH AT BEDTIME 06/06/21   [name], MD    Physical Exam    Vital Signs:  ARAGORN RECKER does not have vital signs available for review today.  Given telephonic nature of communication, physical exam is limited. AAOx3. NAD. Normal affect.  Speech and respirations are unlabored.  Accessory Clinical Findings    None  Assessment & Plan    1.  Preoperative Cardiovascular Risk Assessment: The patient is doing well from a cardiac perspective. Therefore, based on ACC/AHA guidelines, the patient would be at acceptable risk for the planned procedure without further cardiovascular testing. According to the Revised Cardiac Risk Index (RCRI), his Perioperative Risk of Major Cardiac Event is (%): 0.4. His Functional Capacity in METs is: 6.91 according to the [facility] Activity Status Index (DASI).   The patient was advised that if he develops new symptoms prior to surgery to contact our office to arrange for a follow-up visit, and he verbalized understanding.   A copy of this note will be routed to requesting surgeon.  Time:    Today, I have spent 8 minutes with the patient with telehealth technology discussing medical history, symptoms, and  management plan.     Emmaline Life, NP-C  03/03/2022, 1:20 PM [address] Office [phone] Fax [phone]

## 2022-03-03 NOTE — Telephone Encounter (Signed)
Primary Cardiologist:Timothy Rockey Situ, MD   Preoperative team, please contact this patient and set up a phone call appointment for further preoperative risk assessment. Please obtain consent and complete medication review. Thank you for your help.   I confirm that guidance regarding antiplatelet and oral anticoagulation therapy has been completed and, if necessary, noted below (none requested).    Emmaline Life, NP-C  03/03/2022, 11:13 AM [address], [address] Office [phone] Fax [phone]

## 2022-03-03 NOTE — Progress Notes (Signed)
Pt presented today for b12 injection. Right deltoid, IM. Pt voiced no concerns nor showed any signs of distress during injection.  

## 2022-03-03 NOTE — Telephone Encounter (Signed)
IC s/w pt and advised We will hold off at this time and address at his first post op visit.

## 2022-03-03 NOTE — Progress Notes (Signed)
Anesthesia Chart Review:  Follows with cardiology for hx of CAD with cardiac cath that showed 30% LCx 2017 and coronary CTA 01/2019 showed calcium score 50, mild (<50) d1, mild (<50) LCx and minimal RCA, acute DVT 02/2021, OSA, HLD, and hypertension. Seen by Christen Bame, NP via [place] 03/03/22 for preop eval. Pre note, "Preoperative Cardiovascular Risk Assessment: The patient is doing well from a cardiac perspective. Therefore, based on ACC/AHA guidelines, the patient would be at acceptable risk for the planned procedure without further cardiovascular testing. According to the Revised Cardiac Risk Index (RCRI), his Perioperative Risk of Major Cardiac Event is (%): 0.4. His Functional Capacity in METs is: 6.91 according to the Duke Activity Status Index (DASI)."  OSA, CPAP currently broken.   NIDDM2. Last A1c on 01/16/2022 was 6.8   Preop labs reviewed, unremarkable.   EKG 02/01/22: Normal sinus rhythm. Rate 90. Right superior axis deviation. Incomplete right bundle branch block. Right ventricular hypertrophy with repolarization abnormality  TTE 10/04/21:  1. Left ventricular ejection fraction, by estimation, is 65 to 70%. The  left ventricle has normal function. The left ventricle has no regional  wall motion abnormalities. Left ventricular diastolic parameters are  consistent with Grade II diastolic  dysfunction (pseudonormalization).   2. Right ventricular systolic function is normal. The right ventricular  size is normal. There is normal pulmonary artery systolic pressure.   3. Right atrial size was mildly dilated.   4. The mitral valve is normal in structure. Trivial mitral valve  regurgitation.   5. The aortic valve is normal in structure. Aortic valve regurgitation is  trivial.   Coronary CT 02/17/19: IMPRESSION: 1. Coronary artery calcium score 50 Agatston units. This places the patient in the 82nd percentile for age and gender, suggesting high risk for future cardiac  events.   2.  Nonobstructive disease in the LAD and RCA systems.   3. Misregistration artifact makes interpretation of the mid LCx difficult. I suspect mild stenosis in the mid LCx. Due to misregistration, I do not think that FFR would be successful on this study.  Cath 08/27/2015: Assessment & Plan:   1. Minimal CAD with 30% mid LCX lesion.    Plan Medical therapy.     [name] [facility] Short Stay [facility]/Anesthesiology Phone [phone] 03/03/2022 4:30 PM

## 2022-03-03 NOTE — Telephone Encounter (Signed)
     Pre-operative Risk Assessment    Patient Name: [name]  DOB: 11-12-1965 MRN: [id number]      Request for Surgical Clearance    Procedure:   right shoulder arthroscopy  and rotator cuff repair   Date of Surgery:  Clearance 03/07/22                                 Surgeon:  Dr. Meredith Pel  Surgeon's Group or Practice Name:  [facility] Phone number:  [phone] or Jackelyn Poling - [phone] Fax number:  [phone]   Type of Clearance Requested:   - Medical    Type of Anesthesia:  General  with interscalene block    Additional requests/questions:    Signed, Selinda Orion   03/03/2022, 10:29 AM

## 2022-03-07 ENCOUNTER — Encounter: Payer: Self-pay | Admitting: Orthopedic Surgery

## 2022-03-07 ENCOUNTER — Encounter (HOSPITAL_COMMUNITY): Payer: Self-pay | Admitting: Orthopedic Surgery

## 2022-03-07 ENCOUNTER — Ambulatory Visit (HOSPITAL_COMMUNITY): Payer: Medicare HMO | Admitting: Physician Assistant

## 2022-03-07 ENCOUNTER — Ambulatory Visit (HOSPITAL_BASED_OUTPATIENT_CLINIC_OR_DEPARTMENT_OTHER): Payer: Medicare HMO | Admitting: Anesthesiology

## 2022-03-07 ENCOUNTER — Ambulatory Visit (HOSPITAL_COMMUNITY)
Admission: RE | Admit: 2022-03-07 | Discharge: 2022-03-07 | Disposition: A | Discharge status: Home / Self Care | Payer: Medicare HMO | Attending: Orthopedic Surgery | Admitting: Orthopedic Surgery

## 2022-03-07 ENCOUNTER — Other Ambulatory Visit: Payer: Self-pay

## 2022-03-07 ENCOUNTER — Ambulatory Visit: Payer: Medicare HMO | Admitting: Internal Medicine

## 2022-03-07 ENCOUNTER — Encounter (HOSPITAL_COMMUNITY)
Admission: RE | Disposition: A | Discharge status: Home / Self Care | Payer: Self-pay | Source: Home / Self Care | Attending: Orthopedic Surgery

## 2022-03-07 DIAGNOSIS — E039 Hypothyroidism, unspecified: Secondary | ICD-10-CM | POA: Diagnosis not present

## 2022-03-07 DIAGNOSIS — M1990 Unspecified osteoarthritis, unspecified site: Secondary | ICD-10-CM | POA: Insufficient documentation

## 2022-03-07 DIAGNOSIS — E119 Type 2 diabetes mellitus without complications: Secondary | ICD-10-CM | POA: Diagnosis not present

## 2022-03-07 DIAGNOSIS — S46011A Strain of muscle(s) and tendon(s) of the rotator cuff of right shoulder, initial encounter: Secondary | ICD-10-CM | POA: Insufficient documentation

## 2022-03-07 DIAGNOSIS — F32A Depression, unspecified: Secondary | ICD-10-CM | POA: Insufficient documentation

## 2022-03-07 DIAGNOSIS — Z87891 Personal history of nicotine dependence: Secondary | ICD-10-CM | POA: Diagnosis not present

## 2022-03-07 DIAGNOSIS — Y9389 Activity, other specified: Secondary | ICD-10-CM | POA: Insufficient documentation

## 2022-03-07 DIAGNOSIS — F418 Other specified anxiety disorders: Secondary | ICD-10-CM | POA: Diagnosis not present

## 2022-03-07 DIAGNOSIS — M65811 Other synovitis and tenosynovitis, right shoulder: Secondary | ICD-10-CM | POA: Diagnosis not present

## 2022-03-07 DIAGNOSIS — M75101 Unspecified rotator cuff tear or rupture of right shoulder, not specified as traumatic: Secondary | ICD-10-CM | POA: Diagnosis not present

## 2022-03-07 DIAGNOSIS — Z9989 Dependence on other enabling machines and devices: Secondary | ICD-10-CM | POA: Diagnosis not present

## 2022-03-07 DIAGNOSIS — G4730 Sleep apnea, unspecified: Secondary | ICD-10-CM | POA: Insufficient documentation

## 2022-03-07 DIAGNOSIS — G4733 Obstructive sleep apnea (adult) (pediatric): Secondary | ICD-10-CM

## 2022-03-07 DIAGNOSIS — F419 Anxiety disorder, unspecified: Secondary | ICD-10-CM | POA: Insufficient documentation

## 2022-03-07 DIAGNOSIS — G8918 Other acute postprocedural pain: Secondary | ICD-10-CM | POA: Diagnosis not present

## 2022-03-07 DIAGNOSIS — Z01818 Encounter for other preprocedural examination: Secondary | ICD-10-CM

## 2022-03-07 DIAGNOSIS — M75121 Complete rotator cuff tear or rupture of right shoulder, not specified as traumatic: Secondary | ICD-10-CM | POA: Diagnosis not present

## 2022-03-07 DIAGNOSIS — I10 Essential (primary) hypertension: Secondary | ICD-10-CM | POA: Insufficient documentation

## 2022-03-07 HISTORY — PX: SHOULDER ARTHROSCOPY WITH ROTATOR CUFF REPAIR AND SUBACROMIAL DECOMPRESSION: SHX5686

## 2022-03-07 LAB — GLUCOSE, CAPILLARY
Glucose-Capillary: 126 mg/dL — ABNORMAL HIGH (ref 70–99)
Glucose-Capillary: 125 mg/dL — ABNORMAL HIGH (ref 70–99)
Glucose-Capillary: 148 mg/dL — ABNORMAL HIGH (ref 70–99)

## 2022-03-07 SURGERY — SHOULDER ARTHROSCOPY WITH ROTATOR CUFF REPAIR AND SUBACROMIAL DECOMPRESSION
Anesthesia: Regional | Laterality: Right

## 2022-03-07 MED ORDER — DEXAMETHASONE SODIUM PHOSPHATE 10 MG/ML IJ SOLN
INTRAMUSCULAR | Status: DC | PRN
  Administered 2022-03-07: 10 mg via INTRAVENOUS

## 2022-03-07 MED ORDER — LABETALOL HCL 5 MG/ML IV SOLN
INTRAVENOUS | Status: DC | PRN
  Administered 2022-03-07 (×2): 10 mg via INTRAVENOUS

## 2022-03-07 MED ORDER — PHENYLEPHRINE 80 MCG/ML (10ML) SYRINGE FOR IV PUSH (FOR BLOOD PRESSURE SUPPORT)
PREFILLED_SYRINGE | INTRAVENOUS | Status: DC | PRN
  Administered 2022-03-07: 80 ug via INTRAVENOUS

## 2022-03-07 MED ORDER — OXYCODONE HCL 5 MG PO TABS: 5.0000 mg | ORAL_TABLET | Freq: Once | ORAL | Status: DC | PRN

## 2022-03-07 MED ORDER — EPHEDRINE SULFATE-NACL 50-0.9 MG/10ML-% IV SOSY
PREFILLED_SYRINGE | INTRAVENOUS | Status: DC | PRN
  Administered 2022-03-07: 5 mg via INTRAVENOUS
  Administered 2022-03-07: 10 mg via INTRAVENOUS

## 2022-03-07 MED ORDER — POVIDONE-IODINE 10 % EX SWAB
2.0000 | Freq: Once | CUTANEOUS | Status: AC
  Administered 2022-03-07: 2 via TOPICAL

## 2022-03-07 MED ORDER — FENTANYL CITRATE (PF) 100 MCG/2ML IJ SOLN: 25.0000 ug | INTRAMUSCULAR | Status: DC | PRN

## 2022-03-07 MED ORDER — EPHEDRINE 5 MG/ML INJ
INTRAVENOUS | Status: AC
  Filled 2022-03-07: qty 5

## 2022-03-07 MED ORDER — LACTATED RINGERS IV SOLN: INTRAVENOUS | Status: DC

## 2022-03-07 MED ORDER — ASPIRIN 81 MG PO CHEW: 81.0000 mg | CHEWABLE_TABLET | Freq: Two times a day (BID) | ORAL | 0 refills | Status: AC

## 2022-03-07 MED ORDER — ROCURONIUM BROMIDE 10 MG/ML (PF) SYRINGE
PREFILLED_SYRINGE | INTRAVENOUS | Status: AC
  Filled 2022-03-07: qty 10

## 2022-03-07 MED ORDER — INSULIN ASPART 100 UNIT/ML IJ SOLN: 0.0000 [IU] | INTRAMUSCULAR | Status: DC | PRN

## 2022-03-07 MED ORDER — LIDOCAINE 2% (20 MG/ML) 5 ML SYRINGE
INTRAMUSCULAR | Status: DC | PRN
  Administered 2022-03-07: 60 mg via INTRAVENOUS

## 2022-03-07 MED ORDER — ROPIVACAINE HCL 5 MG/ML IJ SOLN
INTRAMUSCULAR | Status: DC | PRN
  Administered 2022-03-07: 15 mL via PERINEURAL

## 2022-03-07 MED ORDER — ONDANSETRON HCL 4 MG/2ML IJ SOLN
INTRAMUSCULAR | Status: AC
  Filled 2022-03-07: qty 2

## 2022-03-07 MED ORDER — AMISULPRIDE (ANTIEMETIC) 5 MG/2ML IV SOLN: 10.0000 mg | Freq: Once | INTRAVENOUS | Status: DC | PRN

## 2022-03-07 MED ORDER — FENTANYL CITRATE (PF) 100 MCG/2ML IJ SOLN: 100.0000 ug | Freq: Once | INTRAMUSCULAR | Status: AC

## 2022-03-07 MED ORDER — PROPOFOL 10 MG/ML IV BOLUS
INTRAVENOUS | Status: DC | PRN
  Administered 2022-03-07: 150 mg via INTRAVENOUS

## 2022-03-07 MED ORDER — PHENYLEPHRINE HCL-NACL 20-0.9 MG/250ML-% IV SOLN
INTRAVENOUS | Status: DC | PRN
  Administered 2022-03-07: 15 ug/min via INTRAVENOUS

## 2022-03-07 MED ORDER — FENTANYL CITRATE (PF) 100 MCG/2ML IJ SOLN
INTRAMUSCULAR | Status: AC
  Administered 2022-03-07: 100 ug via INTRAVENOUS
  Filled 2022-03-07: qty 2

## 2022-03-07 MED ORDER — ONDANSETRON HCL 4 MG/2ML IJ SOLN
INTRAMUSCULAR | Status: DC | PRN
  Administered 2022-03-07: 4 mg via INTRAVENOUS

## 2022-03-07 MED ORDER — CHLORHEXIDINE GLUCONATE 0.12 % MT SOLN
15.0000 mL | Freq: Once | OROMUCOSAL | Status: AC
  Administered 2022-03-07: 15 mL via OROMUCOSAL
  Filled 2022-03-07: qty 15

## 2022-03-07 MED ORDER — PROPOFOL 10 MG/ML IV BOLUS
INTRAVENOUS | Status: AC
  Filled 2022-03-07: qty 20

## 2022-03-07 MED ORDER — ACETAMINOPHEN 500 MG PO TABS
1000.0000 mg | ORAL_TABLET | Freq: Once | ORAL | Status: AC
  Administered 2022-03-07: 1000 mg via ORAL
  Filled 2022-03-07: qty 2

## 2022-03-07 MED ORDER — FENTANYL CITRATE (PF) 100 MCG/2ML IJ SOLN
INTRAMUSCULAR | Status: AC
  Filled 2022-03-07: qty 2

## 2022-03-07 MED ORDER — DEXAMETHASONE SODIUM PHOSPHATE 10 MG/ML IJ SOLN
INTRAMUSCULAR | Status: AC
  Filled 2022-03-07: qty 1

## 2022-03-07 MED ORDER — VANCOMYCIN HCL 1000 MG IV SOLR
INTRAVENOUS | Status: AC
  Filled 2022-03-07: qty 20

## 2022-03-07 MED ORDER — HYDRALAZINE HCL 20 MG/ML IJ SOLN: 5.0000 mg | INTRAMUSCULAR | Status: DC | PRN

## 2022-03-07 MED ORDER — ROCURONIUM BROMIDE 10 MG/ML (PF) SYRINGE
PREFILLED_SYRINGE | INTRAVENOUS | Status: DC | PRN
  Administered 2022-03-07: 100 mg via INTRAVENOUS

## 2022-03-07 MED ORDER — PHENYLEPHRINE 80 MCG/ML (10ML) SYRINGE FOR IV PUSH (FOR BLOOD PRESSURE SUPPORT)
PREFILLED_SYRINGE | INTRAVENOUS | Status: AC
  Filled 2022-03-07: qty 10

## 2022-03-07 MED ORDER — EPINEPHRINE PF 1 MG/ML IJ SOLN
INTRAMUSCULAR | Status: DC | PRN
  Administered 2022-03-07: 1 mL

## 2022-03-07 MED ORDER — POVIDONE-IODINE 7.5 % EX SOLN: Freq: Once | CUTANEOUS | Status: DC

## 2022-03-07 MED ORDER — MIDAZOLAM HCL 2 MG/2ML IJ SOLN: 2.0000 mg | Freq: Once | INTRAMUSCULAR | Status: AC

## 2022-03-07 MED ORDER — OXYCODONE-ACETAMINOPHEN 7.5-325 MG PO TABS: 1.0000 | ORAL_TABLET | ORAL | 0 refills | Status: DC | PRN

## 2022-03-07 MED ORDER — BUPIVACAINE LIPOSOME 1.3 % IJ SUSP
INTRAMUSCULAR | Status: DC | PRN
  Administered 2022-03-07: 10 mL via PERINEURAL

## 2022-03-07 MED ORDER — SUGAMMADEX SODIUM 200 MG/2ML IV SOLN
INTRAVENOUS | Status: DC | PRN
  Administered 2022-03-07: 200 mg via INTRAVENOUS

## 2022-03-07 MED ORDER — OXYCODONE HCL 5 MG/5ML PO SOLN: 5.0000 mg | Freq: Once | ORAL | Status: DC | PRN

## 2022-03-07 MED ORDER — ONDANSETRON HCL 4 MG/2ML IJ SOLN: 4.0000 mg | Freq: Once | INTRAMUSCULAR | Status: DC | PRN

## 2022-03-07 MED ORDER — SODIUM CHLORIDE 0.9 % IR SOLN
Status: DC | PRN
  Administered 2022-03-07: 1000 mL
  Administered 2022-03-07 (×2): 3000 mL

## 2022-03-07 MED ORDER — MIDAZOLAM HCL 2 MG/2ML IJ SOLN
INTRAMUSCULAR | Status: AC
  Administered 2022-03-07: 2 mg via INTRAVENOUS
  Filled 2022-03-07: qty 2

## 2022-03-07 MED ORDER — CEFAZOLIN SODIUM-DEXTROSE 2-4 GM/100ML-% IV SOLN
2.0000 g | INTRAVENOUS | Status: AC
  Administered 2022-03-07: 2 g via INTRAVENOUS
  Filled 2022-03-07: qty 100

## 2022-03-07 MED ORDER — FENTANYL CITRATE (PF) 250 MCG/5ML IJ SOLN
INTRAMUSCULAR | Status: DC | PRN
  Administered 2022-03-07 (×2): 50 ug via INTRAVENOUS

## 2022-03-07 MED ORDER — ORAL CARE MOUTH RINSE: 15.0000 mL | Freq: Once | OROMUCOSAL | Status: AC

## 2022-03-07 MED ORDER — VANCOMYCIN HCL 1000 MG IV SOLR
INTRAVENOUS | Status: DC | PRN
  Administered 2022-03-07: 1000 mg via TOPICAL

## 2022-03-07 MED ORDER — LIDOCAINE 2% (20 MG/ML) 5 ML SYRINGE
INTRAMUSCULAR | Status: AC
  Filled 2022-03-07: qty 5

## 2022-03-07 SURGICAL SUPPLY — 70 items
ALCOHOL 70% 16 OZ (MISCELLANEOUS) ×1 IMPLANT
ANCHOR FBRTK 2.6 SUTURETAP 1.3 (Anchor) IMPLANT
ANCHOR SUT BIOCOMP CORKSREW (Anchor) IMPLANT
ANCHOR SUT SWIVELLOK BIO (Anchor) IMPLANT
ANCHOR SWIVELOCK BIO 4.75X19.1 (Anchor) IMPLANT
BAG COUNTER SPONGE SURGICOUNT (BAG) ×1 IMPLANT
BLADE CUTTER GATOR 3.5 (BLADE) IMPLANT
BLADE EXCALIBUR 4.0X13 (MISCELLANEOUS) IMPLANT
BLADE SURG 11 STRL SS (BLADE) IMPLANT
BUR OVAL 6.0 (BURR) IMPLANT
COVER SURGICAL LIGHT HANDLE (MISCELLANEOUS) ×1 IMPLANT
DRAPE INCISE IOBAN 66X45 STRL (DRAPES) ×2 IMPLANT
DRAPE STERI 35X30 U-POUCH (DRAPES) ×1 IMPLANT
DRAPE U-SHAPE 47X51 STRL (DRAPES) ×2 IMPLANT
DRSG TEGADERM 4X4.75 (GAUZE/BANDAGES/DRESSINGS) ×3 IMPLANT
DRSG XEROFORM 1X8 (GAUZE/BANDAGES/DRESSINGS) IMPLANT
DURAPREP 26ML APPLICATOR (WOUND CARE) ×1 IMPLANT
DW OUTFLOW CASSETTE/TUBE SET (MISCELLANEOUS) IMPLANT
ELECT REM PT RETURN 9FT ADLT (ELECTROSURGICAL) ×1
ELECTRODE REM PT RTRN 9FT ADLT (ELECTROSURGICAL) ×1 IMPLANT
FILTER STRAW FLUID ASPIR (MISCELLANEOUS) ×1 IMPLANT
GAUZE SPONGE 4X4 12PLY STRL (GAUZE/BANDAGES/DRESSINGS) ×1 IMPLANT
GAUZE XEROFORM 1X8 LF (GAUZE/BANDAGES/DRESSINGS) ×1 IMPLANT
GLOVE BIOGEL PI IND STRL 7.0 (GLOVE) ×1 IMPLANT
GLOVE BIOGEL PI IND STRL 8 (GLOVE) ×1 IMPLANT
GLOVE ECLIPSE 7.0 STRL STRAW (GLOVE) ×1 IMPLANT
GLOVE ECLIPSE 8.0 STRL XLNG CF (GLOVE) ×1 IMPLANT
GOWN STRL REUS W/ TWL LRG LVL3 (GOWN DISPOSABLE) ×3 IMPLANT
GOWN STRL REUS W/TWL LRG LVL3 (GOWN DISPOSABLE) ×3
HYDROGEN PEROXIDE 16OZ (MISCELLANEOUS) ×1 IMPLANT
KIT BASIN OR (CUSTOM PROCEDURE TRAY) ×1 IMPLANT
KIT TURNOVER KIT B (KITS) ×1 IMPLANT
MANIFOLD NEPTUNE II (INSTRUMENTS) ×1 IMPLANT
NDL HYPO 25X1 1.5 SAFETY (NEEDLE) ×1 IMPLANT
NDL SCORPION MULTI FIRE (NEEDLE) IMPLANT
NDL SPNL 18GX3.5 QUINCKE PK (NEEDLE) ×1 IMPLANT
NDL SUT 6 .5 CRC .975X.05 MAYO (NEEDLE) ×1 IMPLANT
NEEDLE HYPO 25X1 1.5 SAFETY (NEEDLE) IMPLANT
NEEDLE MAYO TAPER (NEEDLE) ×1
NEEDLE SCORPION MULTI FIRE (NEEDLE) ×1 IMPLANT
NEEDLE SPNL 18GX3.5 QUINCKE PK (NEEDLE) ×1 IMPLANT
NS IRRIG 1000ML POUR BTL (IV SOLUTION) ×1 IMPLANT
PACK SHOULDER (CUSTOM PROCEDURE TRAY) ×1 IMPLANT
PAD ABD 8X10 STRL (GAUZE/BANDAGES/DRESSINGS) IMPLANT
PAD ARMBOARD 7.5X6 YLW CONV (MISCELLANEOUS) ×2 IMPLANT
PORT APPOLLO RF 90DEGREE MULTI (SURGICAL WAND) IMPLANT
RESTRAINT HEAD UNIVERSAL NS (MISCELLANEOUS) ×1 IMPLANT
SLING ARM FOAM STRAP XLG (SOFTGOODS) IMPLANT
SLING ARM IMMOBILIZER LRG (SOFTGOODS) IMPLANT
SOL PREP POV-IOD 4OZ 10% (MISCELLANEOUS) ×2 IMPLANT
SPONGE T-LAP 4X18 ~~LOC~~+RFID (SPONGE) ×1 IMPLANT
STRIP CLOSURE SKIN 1/2X4 (GAUZE/BANDAGES/DRESSINGS) ×1 IMPLANT
SUCTION FRAZIER HANDLE 10FR (MISCELLANEOUS) ×1
SUCTION TUBE FRAZIER 10FR DISP (MISCELLANEOUS) ×1 IMPLANT
SUT 2 FIBERLOOP 20 STRT BLUE (SUTURE)
SUT ETHILON 3 0 PS 1 (SUTURE) ×2 IMPLANT
SUT FIBERWIRE #2 38 T-5 BLUE (SUTURE)
SUT MNCRL AB 3-0 PS2 18 (SUTURE) ×1 IMPLANT
SUT VIC AB 0 CT1 27 (SUTURE) ×1
SUT VIC AB 0 CT1 27XBRD ANBCTR (SUTURE) ×2 IMPLANT
SUT VIC AB 1 CT1 27 (SUTURE) ×1
SUT VIC AB 1 CT1 27XBRD ANBCTR (SUTURE) ×1 IMPLANT
SUT VIC AB 2-0 CT1 27 (SUTURE) ×1
SUT VIC AB 2-0 CT1 TAPERPNT 27 (SUTURE) ×1 IMPLANT
SUT VICRYL 0 UR6 27IN ABS (SUTURE) ×1 IMPLANT
SUTURE 2 FIBERLOOP 20 STRT BLU (SUTURE) IMPLANT
SUTURE FIBERWR #2 38 T-5 BLUE (SUTURE) IMPLANT
TOWEL GREEN STERILE (TOWEL DISPOSABLE) ×1 IMPLANT
TUBING ARTHROSCOPY IRRIG 16FT (MISCELLANEOUS) ×1 IMPLANT
WATER STERILE IRR 1000ML POUR (IV SOLUTION) ×1 IMPLANT

## 2022-03-07 NOTE — Anesthesia Postprocedure Evaluation (Signed)
Anesthesia Post Note  Patient: ATTIKUS BARTOSZEK  Procedure(s) Performed: RIGHT SHOULDER ARTHROSCOPY, DEBRIDEMENT, MINI OPEN ROTATOR CUFF TEAR REPAIR (Right)     Patient location during evaluation: PACU Anesthesia Type: Regional and General Level of consciousness: awake and alert, oriented and patient cooperative Pain management: pain level controlled Vital Signs Assessment: post-procedure vital signs reviewed and stable Respiratory status: spontaneous breathing, nonlabored ventilation and respiratory function stable Cardiovascular status: blood pressure returned to baseline and stable Postop Assessment: no apparent nausea or vomiting Anesthetic complications: no Comments: Baseline oxygen saturation 90% per pt    No notable events documented.  Last Vitals:  Vitals:   03/07/22 1700 03/07/22 1702  BP: (!) 170/89   Pulse: 85 85  Resp: 11 11  Temp: 36.8 C   SpO2: 92% 90%    Last Pain:  Vitals:   03/07/22 1700  TempSrc:   PainSc: 0-No pain                 Pervis Hocking

## 2022-03-07 NOTE — Anesthesia Procedure Notes (Signed)
Procedure Name: Intubation Date/Time: 03/07/2022 1:33 PM  Performed by: Lind Guest, CRNAPre-anesthesia Checklist: Patient identified, Emergency Drugs available, Suction available, Patient being monitored and Timeout performed Patient Re-evaluated:Patient Re-evaluated prior to induction Oxygen Delivery Method: Circle system utilized Preoxygenation: Pre-oxygenation with 100% oxygen Induction Type: IV induction Ventilation: Mask ventilation without difficulty Laryngoscope Size: Glidescope and 4 Grade View: Grade I Tube type: Oral Rae Tube size: 7.0 mm Number of attempts: 1 Placement Confirmation: ETT inserted through vocal cords under direct vision, positive ETCO2 and breath sounds checked- equal and bilateral Secured at: 22 cm Tube secured with: Tape Dental Injury: Teeth and Oropharynx as per pre-operative assessment

## 2022-03-07 NOTE — H&P (Signed)
[name] is an 56 y.o. male.   Chief Complaint: Right shoulder pain HPI:  [name] is a 56 y.o. male who presents to the office reporting right shoulder pain.  Since he was last seen he has had an MRI scan of the right shoulder.  Had what sounds like arthroscopy and biceps tenodesis in the right shoulder about 15 years ago.  Felt a pop in his shoulder when he was dealing with a chair recently.  Prior to that he was doing well..     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   Anxiety    Anxiety and depression    Arthritis    knees and shoulders   Benzodiazepine withdrawal with complication ([place]) 93/81/0175   Bruit    L   Chest pain    hx   Cognitive complaints with normal neuropsychological exam 10/23/2016   Diabetes mellitus without complication (HCC)    "borderline", diet controlled, no meds, patient has lost 30 lbs   Edema    Fatty liver    GERD (gastroesophageal reflux disease)    uses Omeprazole   Goiter    HLD (hyperlipidemia)    Hypertension    essential, benign   Hypothyroidism    Impotence of organic origin    Murmur    never has caused any problems   Neuromuscular disorder (HCC)    neuropathy bilateral feet   Other chest pain    tightness, pressure   Palpitation    hx   Precordial pain    Sleep apnea    uses cpap    Past Surgical History:  Procedure Laterality Date   APPENDECTOMY  2002   done at [facility] N/A 08/27/2015   Procedure: Left Heart Cath and Coronary Angiography;  Surgeon: Jolaine Artist, MD;  Location: [place] [facility];  Service: Cardiovascular;  Laterality: N/A;   COLONOSCOPY  in his 20's   COLONOSCOPY  2019   EAR CYST EXCISION Left 07/08/2019   Procedure: open excision baker's cyst left knee;  Surgeon: Meredith Pel, MD;  Location: [place];  Service: Orthopedics;  Laterality:  Left;   HERNIA REPAIR  02/15/8526   umbilical   JOINT REPLACEMENT     right knee   KNEE ARTHROSCOPY Left 12/17/2018   Procedure: left knee arthroscopy, meniscal debridement, loose body removal;  Surgeon: Meredith Pel, MD;  Location: [facility];  Service: Orthopedics;  Laterality: Left;   REVISION TOTAL KNEE ARTHROPLASTY Right 06/23/2014   DR Dia Donate   TONSILLECTOMY     TOTAL KNEE ARTHROPLASTY Right 2011   right   TOTAL KNEE ARTHROPLASTY Left 02/10/2021   Procedure: LEFT TOTAL KNEE ARTHROPLASTY;  Surgeon: Meredith Pel, MD;  Location: [facility];  Service: Orthopedics;  Laterality: Left;   TOTAL KNEE REVISION Right 06/23/2014   Procedure: TOTAL KNEE REVISION;  Surgeon: Meredith Pel, MD;  Location: [place];  Service: Orthopedics;  Laterality: Right;   UPPER GASTROINTESTINAL ENDOSCOPY  04/30/2013   Judieth Keens TOOTH EXTRACTION      Family History  Problem Relation Age of Onset   Heart disease Father    Colon cancer Neg Hx    Colon polyps Neg Hx    Esophageal cancer Neg Hx    Rectal cancer Neg Hx    Stomach cancer Neg Hx  Social History:  reports that he quit smoking about 16 years ago. His smoking use included cigarettes. He has a 36.00 pack-year smoking history. He has never used smokeless tobacco. He reports current alcohol use of about 4.0 standard drinks of alcohol per week. He reports that he does not use drugs.  Allergies:  Allergies  Allergen Reactions   Ambien [Zolpidem] Swelling and Other (See Comments)    Swelling in the throat    Medications Prior to Admission  Medication Sig Dispense Refill   acetaminophen (TYLENOL) 325 MG tablet Take 1-2 tablets (325-650 mg total) by mouth every 6 (six) hours as needed for mild pain (pain score 1-3 or temp > 100.5). 30 tablet 0   ARIPiprazole (ABILIFY) 5 MG tablet Take one tab at bed time 90 tablet 0   atorvastatin (LIPITOR) 80 MG tablet Take 1 tablet (80 mg total) by mouth daily. 180 tablet 3   buPROPion  (WELLBUTRIN XL) 300 MG 24 hr tablet Take 1 tablet (300 mg total) by mouth daily. 90 tablet 0   busPIRone (BUSPAR) 15 MG tablet Take 1 tablet (15 mg total) by mouth 3 (three) times daily. 90 tablet 1   carvedilol (COREG) 12.5 MG tablet Take 1 tablet (12.5 mg total) by mouth 2 (two) times daily. 180 tablet 3   clonazePAM (KLONOPIN) 2 MG tablet Take 0.5 tablets (1 mg total) by mouth 2 (two) times daily as needed for anxiety. 30 tablet 5   dicyclomine (BENTYL) 20 MG tablet TAKE 1 TABLET BY MOUTH EVERY 6 HOURS 120 tablet 0   ezetimibe (ZETIA) 10 MG tablet Take 1 tablet by mouth once daily 90 tablet 0   furosemide (LASIX) 20 MG tablet Take 1 tablet (20 mg total) by mouth 2 (two) times a week. 4520 tablet 3   gabapentin (NEURONTIN) 600 MG tablet TAKE 1 TABLET BY MOUTH THREE TIMES DAILY 270 tablet 0   glucose blood (ACCU-CHEK AVIVA PLUS) test strip Use to check blood sugars up to 4 times daily. ICD-10 E11.40 400 each 0   glucose blood test strip Use once daily to check BS at various times  E11.9 100 each 12   HYDROcodone-acetaminophen (NORCO) 10-325 MG tablet Take 1 tablet by mouth 2 (two) times daily as needed. 60 tablet 0   levothyroxine (SYNTHROID) 88 MCG tablet TAKE 1 TABLET BY MOUTH ONCE DAILY BEFORE BREAKFAST 90 tablet 1   losartan (COZAAR) 100 MG tablet Take 1 tablet by mouth once daily 90 tablet 3   metFORMIN (GLUCOPHAGE) 500 MG tablet TAKE 1 TABLET BY MOUTH TWICE DAILY WITH A MEAL 180 tablet 1   methocarbamol (ROBAXIN) 500 MG tablet Take 1 tablet (500 mg total) by mouth every 8 (eight) hours as needed. 90 tablet 2   Multiple Vitamin (MULTIVITAMIN WITH MINERALS) TABS tablet Take 1 tablet by mouth daily.     naloxone (NARCAN) nasal spray 4 mg/0.1 mL Use as needed for excessive sedation /opioid overdose 1 each 1   omeprazole (PRILOSEC) 40 MG capsule TAKE 1 CAPSULE BY MOUTH IN THE MORNING AND AT BEDTIME 180 capsule 0   traZODone (DESYREL) 100 MG tablet TAKE 1 & 1/2 (ONE & ONE-HALF) TABLETS BY MOUTH AT  BEDTIME 135 tablet 2   Blood Glucose Monitoring Suppl (ACCU-CHEK AVIVA PLUS) w/Device KIT Use to check blood sugars up to 4 times daily. ICD-10 E11.40 1 kit 0   clobetasol (TEMOVATE) 0.05 % external solution Apply 1 application  topically 2 (two) times daily as needed (scalp irritation).  Clobetasol Propionate 0.05 % shampoo Apply to dry scalp and leave on for 15 minutes (Patient taking differently: Apply 1 Application topically daily as needed (scalp irritation).) 118 mL 5   empagliflozin (JARDIANCE) 10 MG TABS tablet Take 1 tablet (10 mg total) by mouth daily. 30 tablet 2   Lancets (ACCU-CHEK MULTICLIX) lancets Use to to check blood sugars up to 4 times daily. ICD-10: E11.40 400 each 0   promethazine (PHENERGAN) 12.5 MG tablet Take 1 tablet (12.5 mg total) by mouth every 4 (four) hours as needed for nausea or vomiting. (Patient taking differently: Take 12.5 mg by mouth every 8 (eight) hours as needed for nausea or vomiting.) 20 tablet 0   rivaroxaban (XARELTO) 20 MG TABS tablet Take 1 tablet (20 mg total) by mouth daily with supper. (Patient not taking: Reported on 03/03/2022) 30 tablet 0    Results for orders placed or performed during the hospital encounter of 03/07/22 (from the past 48 hour(s))  Glucose, capillary     Status: Abnormal   Collection Time: 03/07/22 10:20 AM  Result Value Ref Range   Glucose-Capillary 126 (H) 70 - 99 mg/dL    Comment: Glucose reference range applies only to samples taken after fasting for at least 8 hours.   No results found.  Review of Systems  Musculoskeletal:  Positive for arthralgias.  All other systems reviewed and are negative.   Blood pressure (!) 183/98, pulse 75, temperature 98.3 F (36.8 C), temperature source Oral, resp. rate 17, height 6' (1.829 m), weight 104.3 kg, SpO2 94 %. Physical Exam Vitals reviewed.  HENT:     Head: Normocephalic.     Nose: Nose normal.     Mouth/Throat:     Mouth: Mucous membranes are moist.  Eyes:      Pupils: Pupils are equal, round, and reactive to light.  Cardiovascular:     Rate and Rhythm: Normal rate.     Pulses: Normal pulses.  Pulmonary:     Effort: Pulmonary effort is normal.  Abdominal:     General: Abdomen is flat.  Musculoskeletal:     Cervical back: Normal range of motion.  Skin:    General: Skin is warm.     Capillary Refill: Capillary refill takes less than 2 seconds.  Neurological:     General: No focal deficit present.     Mental Status: He is alert.  Psychiatric:        Mood and Affect: Mood normal.     Ortho exam demonstrates weakness to infraspinatus and supraspinatus testing on the right against resistance to 4- out of 5.  Subscap strength intact to belly press test on the right.  No discrete AC joint tenderness is present.  No Popeye deformity is present.  No other masses lymphadenopathy or skin changes noted in that shoulder girdle region.  Assessment/Plan Impression is right shoulder rotator cuff tear involving infraspinatus and supraspinatus tendons with about 2 and half centimeters of retraction.  There is some atrophy of the infraspinatus muscle belly.  This may represent acute on chronic tear.  Plan at this time is shoulder arthroscopy with debridement and mini open rotator cuff tear repair of the infraspinatus and supraspinatus.  The risk benefits are discussed with the patient including not limited to infection or vessel damage incomplete pain relief as well as the chance of recurrent tearing.  He would need to use a black brace postoperatively for range of motion exercises but we would really limit any type of active or  active assisted motion until 4 weeks postop.  Would likely keep him in the sling at least 3 to 4 weeks.  Patient understands risk benefits as well as the extensive rehabilitative process required.  All questions answered.   Anderson Malta, MD 03/07/2022, 10:45 AM

## 2022-03-07 NOTE — Transfer of Care (Signed)
Immediate Anesthesia Transfer of Care Note  Patient: [name]  Procedure(s) Performed: RIGHT SHOULDER ARTHROSCOPY, DEBRIDEMENT, MINI OPEN ROTATOR CUFF TEAR REPAIR (Right)  Patient Location: PACU  Anesthesia Type:General and GA combined with regional for post-op pain  Level of Consciousness: awake and oriented  Airway & Oxygen Therapy: Patient Spontanous Breathing and Patient connected to face mask oxygen  Post-op Assessment: Report given to RN and Post -op Vital signs reviewed and stable  Post vital signs: Reviewed and stable  Last Vitals:  Vitals Value Taken Time  BP 172/95 03/07/22 1630  Temp 36.8 C 03/07/22 1627  Pulse 82 03/07/22 1632  Resp 13 03/07/22 1632  SpO2 90 % 03/07/22 1632  Vitals shown include unvalidated device data.  Last Pain:  Vitals:   03/07/22 1225  TempSrc:   PainSc: 0-No pain         Complications: No notable events documented.

## 2022-03-07 NOTE — Op Note (Unsigned)
NAME: [name], [name] MEDICAL RECORD NO: [id number] ACCOUNT NO: [id number] DATE OF BIRTH: 04-17-1966 FACILITY: [facility] LOCATION: [facility] PHYSICIAN: Yetta Barre. Marlou Sa, MD  Operative Report   DATE OF PROCEDURE: 03/07/2022  PREOPERATIVE DIAGNOSIS:  Right shoulder massive rotator cuff tear.  POSTOPERATIVE DIAGNOSIS:  Right shoulder massive rotator cuff tear.  PROCEDURE:  Right shoulder arthroscopy with mini open massive rotator cuff tear repair.  SURGEON:  Yetta Barre. Marlou Sa, MD  ASSISTANT:  Annie Main.  INDICATIONS:  This is a 56 year old patient who underwent shoulder surgery about 20 years ago.  Did well until he was moving a chair several weeks ago when he developed acute onset of pain and weakness in the right shoulder.  He presents now for  operative management after explanation of risks and benefits.  MRI scan shows retracted tear of the infraspinatus and supraspinatus with mild muscle atrophy of the infraspinatus muscle belly.  DESCRIPTION OF PROCEDURE:  The patient was brought to the operating room where general anesthetic was induced.  Preoperative antibiotics administered.  Timeout was called.  The patient was placed in the beach chair position with head in neutral position.   Right arm, shoulder and hand prescrubbed with alcohol and Betadine, allowed to air dry, prepped with DuraPrep solution and draped in sterile manner.  Ioban used to seal the operative field and cover the axilla.  Posterior portal was created.   Diagnostic arthroscopy was performed.  Anterior inferior, posterior inferior glenohumeral ligaments were intact.  Glenohumeral articular surfaces intact.  Retracted rotator cuff tear present.  Sutures visible within the joint and they were later removed  at the mini open portion of the case.  After, getting clear evaluation of the joint articular surface and the labrum, the instrument was removed from the posterior portal, which was then closed using 3-0 nylon.  Next, Ioban  then used to cover the entire  operative field.  Incision made off the anterolateral margin of the acromion.  Skin and subcutaneous tissue sharply divided.  Deltoid was split between the anterior and middle raphae.  About 4 cm from the anterolateral margin of the acromion.  Bursectomy  performed.  Self-retaining retractor placed.  A subacromial decompression was performed with a rasp.  The rotator cuff tear was then mobilized.  This was a crescent shaped tear, which involved the entire infraspinatus and supraspinatus.  10-0 Vicryl  Mason Allen grasping sutures were placed beginning anterior and moving posterior into the leading edge of that rotator cuff tendon.  Then, using a combination of soft tissue releases as well as subacromial releases, the rotator cuff was mobilized as much  as possible to cover the native attachment site on the greater tuberosity.  Subscap was intact by palpation.  The landing zone was then prepared using a rongeur and a scalpel.  Bone quality was marginal.  Through trial and error we ended up using 5.5  corkscrew x3 at the intersection of the humeral head articular surface and greater tuberosity.  That allowed for optimal footprint coverage of the rotator cuff.  With the Vicryl sutures, pulling traction the 12 suture tapes from those 3 anchors were  placed using the Scorpion suture passer beginning posterior to anterior, spaced equidistant.  Then, with the rotator cuff reduced using the traction sutures of Vicryl.  The suture knots were tied x6.  Suture tape limbs were crossed.  Next, the 10 Vicryl  sutures were placed into a 6.25 mm Arthrex SwiveLock and that gave nice initial repair and then the #2 suture  tape limbs, which were crossed with 6 sutures each were placed into 6.25 SwiveLock with good purchase obtained.  Overall, a watertight repair  was achieved.  A thorough irrigation was then performed.  Vancomycin powder placed.  Deltoid split closed using #1 Vicryl suture  followed by interrupted inverted 0 Vicryl suture, 2-0 Vicryl suture, and 3-0 Monocryl with Steri-Strips applied.  The patient  tolerated the procedure well without immediate complications, transferred to the recovery room in stable condition.  Impervious dressings and shoulder immobilizer placed.  We will start passive range of motion on the patient in 1 week.  [name]'s  assistance was required at all times for retraction, opening, closing, mobilization of tissue.  His assistance was a medical necessity.   PUS D: 03/07/2022 4:37:41 pm T: 03/07/2022 6:37:00 pm  JOB: [id number]/ [id number]

## 2022-03-07 NOTE — Brief Op Note (Signed)
   03/07/2022  4:30 PM  PATIENT:  [name]  56 y.o. male  PRE-OPERATIVE DIAGNOSIS:  right shoulder rotator cuff tear  POST-OPERATIVE DIAGNOSIS:    PROCEDURE:  Procedure(s): RIGHT SHOULDER ARTHROSCOPY, DEBRIDEMENT, MINI OPEN ROTATOR CUFF TEAR REPAIR  SURGEON:  Surgeon(s): Marlou Sa, Tonna Corner, MD  ASSISTANT: Annie Main  ANESTHESIA:   General  EBL: 25 ml    Total I/O In: 1000 [I.V.:1000] Out: 25 [Blood:25]  BLOOD ADMINISTERED: none  DRAINS: None  LOCAL MEDICATIONS USED:  none  SPECIMEN:  No Specimen  COUNTS:  YES  TOURNIQUET:  * No tourniquets in log *  DICTATION: .Other Dictation: Dictation Number [id number]  PLAN OF CARE: Discharge to home after PACU  PATIENT DISPOSITION:  PACU - hemodynamically stable

## 2022-03-07 NOTE — Anesthesia Procedure Notes (Signed)
Anesthesia Regional Block: Interscalene brachial plexus block   Pre-Anesthetic Checklist: , timeout performed,  Correct Patient, Correct Site, Correct Laterality,  Correct Procedure, Correct Position, site marked,  Risks and benefits discussed,  Surgical consent,  Pre-op evaluation,  At surgeon's request and post-op pain management  Laterality: Right  Prep: Maximum Sterile Barrier Precautions used, chloraprep       Needles:  Injection technique: Single-shot  Needle Type: Echogenic Stimulator Needle     Needle Length: 9cm  Needle Gauge: 22     Additional Needles:   Procedures:,,,, ultrasound used (permanent image in chart),,    Narrative:  Start time: 03/07/2022 12:05 PM End time: 03/07/2022 12:10 PM Injection made incrementally with aspirations every 5 mL.  Performed by: Personally  Anesthesiologist: Pervis Hocking, DO  Additional Notes: Monitors applied. No increased pain on injection. No increased resistance to injection. Injection made in 5cc increments. Good needle visualization. Patient tolerated procedure well.

## 2022-03-08 ENCOUNTER — Encounter (HOSPITAL_COMMUNITY): Payer: Self-pay | Admitting: Orthopedic Surgery

## 2022-03-13 ENCOUNTER — Other Ambulatory Visit: Payer: Self-pay | Admitting: Internal Medicine

## 2022-03-13 ENCOUNTER — Telehealth: Payer: Self-pay

## 2022-03-13 MED ORDER — DICYCLOMINE HCL 20 MG PO TABS: 20.0000 mg | ORAL_TABLET | Freq: Four times a day (QID) | ORAL | 0 refills | Status: DC

## 2022-03-13 MED ORDER — OMEPRAZOLE 40 MG PO CPDR: DELAYED_RELEASE_CAPSULE | ORAL | 0 refills | Status: DC

## 2022-03-13 MED ORDER — GABAPENTIN 600 MG PO TABS: 600.0000 mg | ORAL_TABLET | Freq: Three times a day (TID) | ORAL | 0 refills | Status: DC

## 2022-03-13 NOTE — Telephone Encounter (Addendum)
Patient called stating that his bandage is loose and would like to know if he can shower or not and would like to when does he suppose to use CPM? Patient had right shoulder surgery on 03/07/2022. Cb# [phone].  Please advise.  Thank you.

## 2022-03-13 NOTE — Telephone Encounter (Signed)
If the bandage is loose then I would say avoid shower water to the shoulder but okay to shower the rest of the body. Just sponge-bathe the operative site.  Can start CPM machine tomorrow as described in the discharge instructions: start 03/14/22 and use 3x/day for one hour each session, increasing the range of motion with each session

## 2022-03-13 NOTE — Telephone Encounter (Signed)
IC advised.  

## 2022-03-15 ENCOUNTER — Ambulatory Visit (INDEPENDENT_AMBULATORY_CARE_PROVIDER_SITE_OTHER): Payer: Medicare HMO | Admitting: Orthopedic Surgery

## 2022-03-15 ENCOUNTER — Encounter: Payer: Self-pay | Admitting: Orthopedic Surgery

## 2022-03-15 DIAGNOSIS — M75121 Complete rotator cuff tear or rupture of right shoulder, not specified as traumatic: Secondary | ICD-10-CM

## 2022-03-15 MED ORDER — OXYCODONE-ACETAMINOPHEN 5-325 MG PO TABS: 1.0000 | ORAL_TABLET | Freq: Four times a day (QID) | ORAL | 0 refills | Status: DC | PRN

## 2022-03-15 NOTE — Progress Notes (Signed)
Post-Op Visit Note   Patient: [name]           Date of Birth: [birth year]           MRN: [id number] Visit Date: 03/15/2022 PCP: [name], MD   Assessment & Plan:  Chief Complaint:  Chief Complaint  Patient presents with   Right Shoulder - Routine Post Op   Visit Diagnoses:  1. Complete tear of right rotator cuff, unspecified whether traumatic     Plan: [name] is a 56 year old patient who underwent right shoulder arthroscopy debridement mini open rotator cuff tear repair 03/07/2022 of a massive rotator cuff tear which was a revision.  Started CPM machine 2 days ago which was 1 week after his surgery.  He is up to 52 degrees.  Oxycodone is helping and that is refilled today.  Incision intact.  Plan at this time is to stay in the sling and really just do the CPM machine for now for the next 3 weeks.  3-week return with clinical recheck and possible discontinuation of the sling at that time.  No lifting with the right arm.  We will start formal physical therapy at 6 weeks postop for strengthening.  In that 4 to 6-week time.  We will likely have him out of the sling with no lifting but not doing any strengthening due to the revision nature of the surgery.  Follow-Up Instructions: No follow-ups on file.   Orders:  No orders of the defined types were placed in this encounter.  Meds ordered this encounter  Medications   oxyCODONE-acetaminophen (PERCOCET) 5-325 MG tablet    Sig: Take 1 tablet by mouth every 6 (six) hours as needed for severe pain.    Dispense:  30 tablet    Refill:  0    Imaging: No results found.  PMFS History: Patient Active Problem List   Diagnosis Date Noted   Pernicious anemia 10/12/2021   Chronic pain associated with significant psychosocial dysfunction 03/05/2021   History of deep venous thrombosis (DVT) of distal vein of left lower extremity 03/05/2021   Arthritis of left knee    Osteoarthritis 02/10/2021   S/P TKR (total knee replacement),  left 02/10/2021   Grief counseling 11/14/2020   Abnormal electrocardiogram (ECG) (EKG) 09/02/2020   Preoperative evaluation to rule out surgical contraindication 09/02/2020   MDD (major depressive disorder), recurrent episode, moderate ([place]) 05/29/2020   Colon cancer screening 02/02/2020   Eczema of both external ears 12/08/2019   1+ pitting edema 12/08/2019   Polyarthritis of multiple sites 10/27/2019   Coronary artery calcification seen on CT scan 10/27/2019   Radiculopathy, cervical region 06/10/2019   Carpal tunnel syndrome, left upper limb 06/10/2019   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   Spinal stenosis of cervical region 12/31/2018   Loose body of left knee    Plantar fasciitis of left foot 08/24/2017   Chronic pain of both hips 06/20/2017   Complete rotator cuff tear of left shoulder 09/14/2015   Hypothyroidism due to acquired atrophy of thyroid 09/14/2015   Arthritis of knee 06/23/2014   Encounter for preventive health examination 06/17/2014   Enlarged RV (right ventricle) 06/17/2014   GERD (gastroesophageal reflux disease) 04/20/2014   Cervical spine degeneration 02/17/2014   Overweight 10/29/2013   Chronic knee pain 10/29/2013   Type 2 diabetes mellitus with diabetic neuropathy, unspecified ([place]) 10/23/2012   Seborrheic dermatitis, unspecified 05/17/2012   Fatty liver 04/22/2012   Hypogonadism male 03/12/2012   Chronic pain of multiple joints  12/24/2010   Sciatica of left side associated with disorder of lumbar spine 12/24/2010   Hyperlipidemia associated with type 2 diabetes mellitus ([place]) 01/13/2009   Anxiety state 01/13/2009   Essential hypertension 01/13/2009   OSA (obstructive sleep apnea) 01/13/2009   Past Medical History:  Diagnosis Date   Acute medial meniscus tear of left knee    Alcohol-induced chronic pancreatitis ([place]) 11/20/2016   Anxiety    Anxiety and depression    Arthritis    knees and shoulders   Benzodiazepine withdrawal with complication  ([place]) 78/29/5621   Bruit    L   Chest pain    hx   Cognitive complaints with normal neuropsychological exam 10/23/2016   Diabetes mellitus without complication (HCC)    "borderline", diet controlled, no meds, patient has lost 30 lbs   Edema    Fatty liver    GERD (gastroesophageal reflux disease)    uses Omeprazole   Goiter    HLD (hyperlipidemia)    Hypertension    essential, benign   Hypothyroidism    Impotence of organic origin    Murmur    never has caused any problems   Neuromuscular disorder (HCC)    neuropathy bilateral feet   Other chest pain    tightness, pressure   Palpitation    hx   Precordial pain    Sleep apnea    uses cpap    Family History  Problem Relation Age of Onset   Heart disease Father    Colon cancer Neg Hx    Colon polyps Neg Hx    Esophageal cancer Neg Hx    Rectal cancer Neg Hx    Stomach cancer Neg Hx     Past Surgical History:  Procedure Laterality Date   APPENDECTOMY  2002   done at [facility] [facility] N/A 08/27/2015   Procedure: Left Heart Cath and Coronary Angiography;  Surgeon: Jolaine Artist, MD;  Location: [facility];  Service: Cardiovascular;  Laterality: N/A;   COLONOSCOPY  in his 20's   COLONOSCOPY  2019   EAR CYST EXCISION Left 07/08/2019   Procedure: open excision baker's cyst left knee;  Surgeon: Meredith Pel, MD;  Location: [facility];  Service: Orthopedics;  Laterality: Left;   HERNIA REPAIR  30/86/5784   umbilical   JOINT REPLACEMENT     right knee   KNEE ARTHROSCOPY Left 12/17/2018   Procedure: left knee arthroscopy, meniscal debridement, loose body removal;  Surgeon: Meredith Pel, MD;  Location: [place];  Service: Orthopedics;  Laterality: Left;   REVISION TOTAL KNEE ARTHROPLASTY Right 06/23/2014   DR Marlou Sa   SHOULDER ARTHROSCOPY WITH ROTATOR CUFF REPAIR AND SUBACROMIAL DECOMPRESSION Right 03/07/2022   Procedure: RIGHT SHOULDER ARTHROSCOPY, DEBRIDEMENT,  MINI OPEN ROTATOR CUFF TEAR REPAIR;  Surgeon: Meredith Pel, MD;  Location: [facility];  Service: Orthopedics;  Laterality: Right;   TONSILLECTOMY     TOTAL KNEE ARTHROPLASTY Right 2011   right   TOTAL KNEE ARTHROPLASTY Left 02/10/2021   Procedure: LEFT TOTAL KNEE ARTHROPLASTY;  Surgeon: Meredith Pel, MD;  Location: [facility];  Service: Orthopedics;  Laterality: Left;   TOTAL KNEE REVISION Right 06/23/2014   Procedure: TOTAL KNEE REVISION;  Surgeon: Meredith Pel, MD;  Location: [facility];  Service: Orthopedics;  Laterality: Right;   UPPER GASTROINTESTINAL ENDOSCOPY  04/30/2013   Judieth Keens TOOTH EXTRACTION     Social History  Occupational History   Occupation: Personnel officer: Public relations account executive   Occupation: SERVICE DIRECTOR    Employer: [employer]  Tobacco Use   Smoking status: Former    Packs/day: 2.00    Years: 18.00    Total pack years: 36.00    Types: Cigarettes    Quit date: 04/24/2005    Years since quitting: 16.9   Smokeless tobacco: Never   Tobacco comments:    quit 20 years ago   Vaping Use   Vaping Use: Never used  Substance and Sexual Activity   Alcohol use: Yes    Alcohol/week: 4.0 standard drinks of alcohol    Types: 4 Cans of beer per week    Comment: Occasionally   Drug use: No   Sexual activity: Yes

## 2022-03-18 ENCOUNTER — Encounter: Payer: Self-pay | Admitting: Family Medicine

## 2022-03-18 NOTE — Assessment & Plan Note (Signed)
Recommend patient to be seen by cardiologist for medical clearance given multiple risk factors for CAD.

## 2022-03-25 DIAGNOSIS — M65811 Other synovitis and tenosynovitis, right shoulder: Secondary | ICD-10-CM

## 2022-03-25 DIAGNOSIS — M65911 Unspecified synovitis and tenosynovitis, right shoulder: Secondary | ICD-10-CM

## 2022-03-27 ENCOUNTER — Ambulatory Visit: Payer: Medicare HMO | Admitting: Cardiovascular Disease

## 2022-04-03 ENCOUNTER — Ambulatory Visit: Payer: Medicare HMO

## 2022-04-04 ENCOUNTER — Ambulatory Visit (INDEPENDENT_AMBULATORY_CARE_PROVIDER_SITE_OTHER): Payer: Medicare HMO

## 2022-04-04 DIAGNOSIS — E538 Deficiency of other specified B group vitamins: Secondary | ICD-10-CM | POA: Diagnosis not present

## 2022-04-04 MED ORDER — CYANOCOBALAMIN 1000 MCG/ML IJ SOLN
1000.0000 ug | Freq: Once | INTRAMUSCULAR | Status: AC
  Administered 2022-04-04: 1000 ug via INTRAMUSCULAR

## 2022-04-04 NOTE — Progress Notes (Addendum)
Pt presented for their vitamin B12 injection. Pt was identified through two identifiers. Pt tolerated shot well in their left  deltoid.  

## 2022-04-04 NOTE — Telephone Encounter (Signed)
MyChart messgae sent to patient. 

## 2022-04-05 ENCOUNTER — Ambulatory Visit: Payer: Medicare HMO | Admitting: Orthopedic Surgery

## 2022-04-05 DIAGNOSIS — M75121 Complete rotator cuff tear or rupture of right shoulder, not specified as traumatic: Secondary | ICD-10-CM

## 2022-04-05 MED ORDER — OXYCODONE-ACETAMINOPHEN 5-325 MG PO TABS: ORAL_TABLET | ORAL | 0 refills | Status: DC

## 2022-04-05 NOTE — Telephone Encounter (Signed)
MyChart messgae sent to patient. 

## 2022-04-05 NOTE — Telephone Encounter (Signed)
Error

## 2022-04-07 ENCOUNTER — Telehealth (HOSPITAL_BASED_OUTPATIENT_CLINIC_OR_DEPARTMENT_OTHER): Payer: Medicare HMO | Admitting: Psychiatry

## 2022-04-07 ENCOUNTER — Encounter (HOSPITAL_COMMUNITY): Payer: Self-pay | Admitting: Psychiatry

## 2022-04-07 VITALS — Wt 218.0 lb

## 2022-04-07 DIAGNOSIS — F4321 Adjustment disorder with depressed mood: Secondary | ICD-10-CM

## 2022-04-07 DIAGNOSIS — F419 Anxiety disorder, unspecified: Secondary | ICD-10-CM

## 2022-04-07 DIAGNOSIS — F331 Major depressive disorder, recurrent, moderate: Secondary | ICD-10-CM

## 2022-04-07 MED ORDER — ARIPIPRAZOLE 5 MG PO TABS: ORAL_TABLET | ORAL | 0 refills | Status: DC

## 2022-04-07 MED ORDER — BUPROPION HCL ER (XL) 300 MG PO TB24: 300.0000 mg | ORAL_TABLET | Freq: Every day | ORAL | 0 refills | Status: DC

## 2022-04-07 NOTE — Progress Notes (Signed)
Virtual Visit via Telephone Note  I connected with [name] on 04/07/22 at  8:40 AM EST by telephone and verified that I am speaking with the correct person using two identifiers.  Location: Patient: Home Provider: Home Office   I discussed the limitations, risks, security and privacy concerns of performing an evaluation and management service by telephone and the availability of in person appointments. I also discussed with the patient that there may be a patient responsible charge related to this service. The patient expressed understanding and agreed to proceed.   History of Present Illness: Patient is evaluated by phone session.  He is doing better as started talking to a therapist through the church that is helping his grief process.  He recently had surgery for rotator cuff and feeling better.  He still feels sad and dysphoria about losing his wife but trying to keep himself busy.  He is now going on a regular basis to his friend's machine shop and spent few hours which helps.  He is sleeping better since he had a shoulder surgery and pain level reduced.  Requires pain medicine before going to bed for during the day he does not.  He reported his depression symptoms are not as bad and he is managing well better than he anticipated.  His Thanksgiving was well because he did spend time with family members and his plan is to spend Christmas eve with son and Christmas day with her daughter so he can see his 39-year-old and 14-year-old grand babies.  Patient like living in a mobile home because he have access to see his family whenever he needed.  Before he was living very far from his family members.  He denies any suicidal thoughts.  He denies any crying spells.  He is also compliant with trazodone, BuSpar, Klonopin and gabapentin from other provider.  He feels the Wellbutrin and Abilify working well for him.  He does not feel lonely as much.  We have referred him to IOP and grief counseling but  patient felt that talking to the person through church has helped a lot.  He has a plan to continue therapy from him.  He denies any agitation, anger, suicidal thoughts.  He is trying to lose weight as trying to keep him active.  He had lost more than 9 pounds since the last visit.  He drinks 1-2 times a week which has cut down from the past.  He denies any binge, intoxication.   Past Psychiatric History:  H/O depression and anxiety.  No h/o suicidal attempt or inpatient treatment.  Seen Dr. Jake Michaelis and given Zoloft, Klonopin and Cymbalta.  Later PCP added BuSpar and trazodone.  We tried Lamictal but did not help.    Psychiatric Specialty Exam: Physical Exam  Review of Systems  Weight 230 lb (104.3 kg).Body mass index is 31.19 kg/m.  General Appearance: NA  Eye Contact:  NA  Speech:  Slow  Volume:  Normal  Mood:  Dysphoric  Affect:  NA  Thought Process:  Goal Directed  Orientation:  Full (Time, Place, and Person)  Thought Content:  Rumination  Suicidal Thoughts:  No  Homicidal Thoughts:  No  Memory:  Immediate;   Good Recent;   Good Remote;   Good  Judgement:  Intact  Insight:  Good  Psychomotor Activity:  NA  Concentration:  Concentration: Good and Attention Span: Good  Recall:  Good  Fund of Knowledge:  Good  Language:  Good  Akathisia:  No  Handed:  Right  AIMS (if indicated):     Assets:  Communication Skills Housing Social Support Transportation  ADL's:  Impaired  Cognition:  WNL  Sleep:         Assessment and Plan: Major depressive disorder, recurrent.  Anxiety.  Grief.  Patient doing better and do not have any suicidal thoughts.  He started therapy through the church and that is working well.  He is trying to spend more time with a family member.  Since had rotator cuff surgery his pain level is much better and he is sleeping better.  Continue Wellbutrin XL 300 mg daily and Abilify 5 mg daily.  Patient is also getting BuSpar, trazodone, Klonopin and gabapentin  from other provider.  Discussed medication side effects and benefits.  Recommended to call us back if he has any question or any concern.  Follow-up in 3 months.    Follow Up Instructions:    I discussed the assessment and treatment plan with the patient. The patient was provided an opportunity to ask questions and all were answered. The patient agreed with the plan and demonstrated an understanding of the instructions.   The patient was advised to call back or seek an in-person evaluation if the symptoms worsen or if the condition fails to improve as anticipated.  Collaboration of Care: Other provider involved in patient's care AEB notes are available in epic to review.  Patient/Guardian was advised Release of Information must be obtained prior to any record release in order to collaborate their care with an outside provider. Patient/Guardian was advised if they have not already done so to contact the registration department to sign all necessary forms in order for [place] to release information regarding their care.   Consent: Patient/Guardian gives verbal consent for treatment and assignment of benefits for services provided during this visit. Patient/Guardian expressed understanding and agreed to proceed.    I provided 16 minutes of non-face-to-face time during this encounter.   Kathlee Nations, MD

## 2022-04-08 ENCOUNTER — Encounter: Payer: Self-pay | Admitting: Orthopedic Surgery

## 2022-04-08 NOTE — Progress Notes (Signed)
Post-Op Visit Note   Patient: [name]           Date of Birth: 08/29/1965           MRN: [id number] Visit Date: 04/05/2022 PCP: [name], MD   Assessment & Plan:  Chief Complaint:  Chief Complaint  Patient presents with   Right Shoulder - Routine Post Op    right shoulder arthroscopy debridement mini open rotator cuff tear repair 03/07/2022 of a massive rotator cuff tear which was a revision   Visit Diagnoses: No diagnosis found.  Plan: [name] is a 56 year old patient underwent right shoulder arthroscopy with debridement and repair of massive rotator cuff tear 03/08/2027.  Still in sling.  On pain meds.  Percocet refilled.  DC sling today.  Shoulder feels very good with no coarse grinding or crepitus with internal/external rotation of the arm.  I want him to discontinue the sling but not lift anything with the arm.  Continue with passive range of motion only.  4-week return and we will start likely formal physical therapy at that time.  Strongly cautioned him against doing any type of heavy lifting with that right arm.  Follow-Up Instructions: No follow-ups on file.   Orders:  No orders of the defined types were placed in this encounter.  Meds ordered this encounter  Medications   oxyCODONE-acetaminophen (PERCOCET) 5-325 MG tablet    Sig: 1 po q 12 hrs prn pain    Dispense:  25 tablet    Refill:  0    Imaging: No results found.  PMFS History: Patient Active Problem List   Diagnosis Date Noted   Synovitis of right shoulder 03/25/2022   Pernicious anemia 10/12/2021   Chronic pain associated with significant psychosocial dysfunction 03/05/2021   History of deep venous thrombosis (DVT) of distal vein of left lower extremity 03/05/2021   Arthritis of left knee    Osteoarthritis 02/10/2021   S/P TKR (total knee replacement), left 02/10/2021   Grief counseling 11/14/2020   Abnormal electrocardiogram (ECG) (EKG) 09/02/2020   Preoperative evaluation to rule out  surgical contraindication 09/02/2020   MDD (major depressive disorder), recurrent episode, moderate ([name]) 05/29/2020   Colon cancer screening 02/02/2020   Eczema of both external ears 12/08/2019   1+ pitting edema 12/08/2019   Polyarthritis of multiple sites 10/27/2019   Coronary artery calcification seen on CT scan 10/27/2019   Radiculopathy, cervical region 06/10/2019   Carpal tunnel syndrome, left upper limb 06/10/2019   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   Spinal stenosis of cervical region 12/31/2018   Loose body of left knee    Plantar fasciitis of left foot 08/24/2017   Chronic pain of both hips 06/20/2017   Nontraumatic complete tear of right rotator cuff 09/14/2015   Hypothyroidism due to acquired atrophy of thyroid 09/14/2015   Arthritis of knee 06/23/2014   Encounter for preventive health examination 06/17/2014   Enlarged RV (right ventricle) 06/17/2014   GERD (gastroesophageal reflux disease) 04/20/2014   Cervical spine degeneration 02/17/2014   Overweight 10/29/2013   Chronic knee pain 10/29/2013   Type 2 diabetes mellitus with diabetic neuropathy, unspecified ([place]) 10/23/2012   Seborrheic dermatitis, unspecified 05/17/2012   Fatty liver 04/22/2012   Hypogonadism male 03/12/2012   Chronic pain of multiple joints 12/24/2010   Sciatica of left side associated with disorder of lumbar spine 12/24/2010   Hyperlipidemia associated with type 2 diabetes mellitus ([place]) 01/13/2009   Anxiety state 01/13/2009   Essential hypertension 01/13/2009   OSA (  obstructive sleep apnea) 01/13/2009   Past Medical History:  Diagnosis Date   Acute medial meniscus tear of left knee    Alcohol-induced chronic pancreatitis ([place]) 11/20/2016   Anxiety    Anxiety and depression    Arthritis    knees and shoulders   Benzodiazepine withdrawal with complication ([place]) 94/70/9628   Bruit    L   Chest pain    hx   Cognitive complaints with normal neuropsychological exam 10/23/2016   Diabetes  mellitus without complication (HCC)    "borderline", diet controlled, no meds, patient has lost 30 lbs   Edema    Fatty liver    GERD (gastroesophageal reflux disease)    uses Omeprazole   Goiter    HLD (hyperlipidemia)    Hypertension    essential, benign   Hypothyroidism    Impotence of organic origin    Murmur    never has caused any problems   Neuromuscular disorder (HCC)    neuropathy bilateral feet   Other chest pain    tightness, pressure   Palpitation    hx   Precordial pain    Sleep apnea    uses cpap    Family History  Problem Relation Age of Onset   Heart disease Father    Colon cancer Neg Hx    Colon polyps Neg Hx    Esophageal cancer Neg Hx    Rectal cancer Neg Hx    Stomach cancer Neg Hx     Past Surgical History:  Procedure Laterality Date   APPENDECTOMY  2002   done at [facility] N/A 08/27/2015   Procedure: Left Heart Cath and Coronary Angiography;  Surgeon: Jolaine Artist, MD;  Location: [facility];  Service: Cardiovascular;  Laterality: N/A;   COLONOSCOPY  in his 20's   COLONOSCOPY  2019   EAR CYST EXCISION Left 07/08/2019   Procedure: open excision baker's cyst left knee;  Surgeon: Meredith Pel, MD;  Location: [facility];  Service: Orthopedics;  Laterality: Left;   HERNIA REPAIR  36/62/9476   umbilical   JOINT REPLACEMENT     right knee   KNEE ARTHROSCOPY Left 12/17/2018   Procedure: left knee arthroscopy, meniscal debridement, loose body removal;  Surgeon: Meredith Pel, MD;  Location: [facility];  Service: Orthopedics;  Laterality: Left;   REVISION TOTAL KNEE ARTHROPLASTY Right 06/23/2014   DR Marlou Sa   SHOULDER ARTHROSCOPY WITH ROTATOR CUFF REPAIR AND SUBACROMIAL DECOMPRESSION Right 03/07/2022   Procedure: RIGHT SHOULDER ARTHROSCOPY, DEBRIDEMENT, MINI OPEN ROTATOR CUFF TEAR REPAIR;  Surgeon: Meredith Pel, MD;  Location: [facility];  Service: Orthopedics;  Laterality: Right;    TONSILLECTOMY     TOTAL KNEE ARTHROPLASTY Right 2011   right   TOTAL KNEE ARTHROPLASTY Left 02/10/2021   Procedure: LEFT TOTAL KNEE ARTHROPLASTY;  Surgeon: Meredith Pel, MD;  Location: [facility];  Service: Orthopedics;  Laterality: Left;   TOTAL KNEE REVISION Right 06/23/2014   Procedure: TOTAL KNEE REVISION;  Surgeon: Meredith Pel, MD;  Location: [facility];  Service: Orthopedics;  Laterality: Right;   UPPER GASTROINTESTINAL ENDOSCOPY  04/30/2013   Judieth Keens TOOTH EXTRACTION     Social History   Occupational History   Occupation: Therapist, music    Employer: Public relations account executive   Occupation: SERVICE DIRECTOR    Employer: [employer]  Tobacco Use   Smoking status: Former    Packs/day:  2.00    Years: 18.00    Total pack years: 36.00    Types: Cigarettes    Quit date: 04/24/2005    Years since quitting: 16.9   Smokeless tobacco: Never   Tobacco comments:    quit 20 years ago   Vaping Use   Vaping Use: Never used  Substance and Sexual Activity   Alcohol use: Yes    Alcohol/week: 4.0 standard drinks of alcohol    Types: 4 Cans of beer per week    Comment: Occasionally   Drug use: No   Sexual activity: Yes

## 2022-04-10 ENCOUNTER — Telehealth: Payer: Self-pay | Admitting: Orthopedic Surgery

## 2022-04-10 ENCOUNTER — Other Ambulatory Visit: Payer: Self-pay | Admitting: Internal Medicine

## 2022-04-10 NOTE — Telephone Encounter (Signed)
Patient has equipment and does not know who to call to come pick it up. Patient lost the paper that came with it.  [phone]

## 2022-04-11 NOTE — Telephone Encounter (Signed)
I have messaged [employer] and Ovid Curd with [employer] asking them to reach out to patient.

## 2022-04-19 ENCOUNTER — Encounter: Payer: Self-pay | Admitting: Internal Medicine

## 2022-04-19 ENCOUNTER — Ambulatory Visit (INDEPENDENT_AMBULATORY_CARE_PROVIDER_SITE_OTHER): Payer: Medicare HMO | Admitting: Internal Medicine

## 2022-04-19 VITALS — BP 140/84 | HR 71 | Temp 98.2°F | Ht 72.0 in | Wt 213.8 lb

## 2022-04-19 DIAGNOSIS — E1140 Type 2 diabetes mellitus with diabetic neuropathy, unspecified: Secondary | ICD-10-CM

## 2022-04-19 DIAGNOSIS — E034 Atrophy of thyroid (acquired): Secondary | ICD-10-CM | POA: Diagnosis not present

## 2022-04-19 DIAGNOSIS — G8929 Other chronic pain: Secondary | ICD-10-CM

## 2022-04-19 DIAGNOSIS — I10 Essential (primary) hypertension: Secondary | ICD-10-CM | POA: Diagnosis not present

## 2022-04-19 DIAGNOSIS — E1169 Type 2 diabetes mellitus with other specified complication: Secondary | ICD-10-CM | POA: Diagnosis not present

## 2022-04-19 DIAGNOSIS — M2550 Pain in unspecified joint: Secondary | ICD-10-CM

## 2022-04-19 DIAGNOSIS — M75121 Complete rotator cuff tear or rupture of right shoulder, not specified as traumatic: Secondary | ICD-10-CM

## 2022-04-19 DIAGNOSIS — E785 Hyperlipidemia, unspecified: Secondary | ICD-10-CM | POA: Diagnosis not present

## 2022-04-19 LAB — COMPREHENSIVE METABOLIC PANEL
ALT: 21 U/L (ref 0–53)
AST: 19 U/L (ref 0–37)
Albumin: 4.2 g/dL (ref 3.5–5.2)
Alkaline Phosphatase: 121 U/L — ABNORMAL HIGH (ref 39–117)
BUN: 12 mg/dL (ref 6–23)
CO2: 36 mEq/L — ABNORMAL HIGH (ref 19–32)
Calcium: 9.5 mg/dL (ref 8.4–10.5)
Chloride: 97 mEq/L (ref 96–112)
Creatinine, Ser: 0.87 mg/dL (ref 0.40–1.50)
GFR: 95.82 mL/min (ref >60.00)
Glucose, Bld: 101 mg/dL — ABNORMAL HIGH (ref 70–99)
Potassium: 3.8 mEq/L (ref 3.5–5.1)
Sodium: 138 mEq/L (ref 135–145)
Total Bilirubin: 0.6 mg/dL (ref 0.2–1.2)
Total Protein: 6.5 g/dL (ref 6.0–8.3)

## 2022-04-19 LAB — LIPID PANEL
Cholesterol: 110 mg/dL (ref 0–200)
HDL: 30.4 mg/dL — ABNORMAL LOW (ref >39.00)
LDL Cholesterol: 55 mg/dL (ref 0–99)
NonHDL: 79.47
Total CHOL/HDL Ratio: 4
Triglycerides: 122 mg/dL (ref 0.0–149.0)
VLDL: 24.4 mg/dL (ref 0.0–40.0)

## 2022-04-19 LAB — HEMOGLOBIN A1C: Hgb A1c MFr Bld: 6.4 % (ref 4.6–6.5)

## 2022-04-19 LAB — LDL CHOLESTEROL, DIRECT: Direct LDL: 63 mg/dL

## 2022-04-19 LAB — MICROALBUMIN / CREATININE URINE RATIO
Creatinine,U: 51.2 mg/dL
Microalb Creat Ratio: 1.4 mg/g (ref 0.0–30.0)
Microalb, Ur: <0.7 mg/dL (ref 0.0–1.9)

## 2022-04-19 MED ORDER — EMPAGLIFLOZIN 10 MG PO TABS: 10.0000 mg | ORAL_TABLET | Freq: Every day | ORAL | 2 refills | Status: DC

## 2022-04-19 MED ORDER — LEVOTHYROXINE SODIUM 88 MCG PO TABS: 88.0000 ug | ORAL_TABLET | Freq: Every day | ORAL | 1 refills | Status: DC

## 2022-04-19 MED ORDER — HYDROCODONE-ACETAMINOPHEN 10-325 MG PO TABS: 1.0000 | ORAL_TABLET | Freq: Two times a day (BID) | ORAL | 0 refills | Status: DC | PRN

## 2022-04-19 MED ORDER — EZETIMIBE 10 MG PO TABS: 10.0000 mg | ORAL_TABLET | Freq: Every day | ORAL | 0 refills | Status: DC

## 2022-04-19 NOTE — Progress Notes (Signed)
Subjective:  Patient ID: [name], male    DOB: April 06, 1966  Age: 56 y.o. MRN: [id number]  CC: The primary encounter diagnosis was Essential hypertension. Diagnoses of Hyperlipidemia associated with type 2 diabetes mellitus ([place]), Type 2 diabetes mellitus with diabetic neuropathy, without long-term current use of insulin ([place]), Hypothyroidism due to acquired atrophy of thyroid, Nontraumatic complete tear of right rotator cuff, and Chronic pain of multiple joints were also pertinent to this visit.   HPI [name] presents for  Chief Complaint  Patient presents with   Medical Management of Chronic Issues    Diabetes, hypertension, hyperlipidemia    1) S/P RIGHT SHOULDER ARTHROSCOPY ON NOV 14 BY DR Marlou Sa.  WAS prescribed oxycodone 5/235 nov 14, nov 22 and Dec 13 last qty was 25 on dec 13 by dr dear .  DOING HOME EXERCISES rotator cuff tear on the right side injury occurred  while pulling the recliner lever on the side of his chair. Marland Kitchen   2) CAD:  non critical.  Has appt jan 5 with local cardiology  3) HTN:  Bp has been elevated since amlodipine  was changed to carvedilol 12.5 mg bid   3) Diabetic neuropathy:  described as tingling  sensation  in feet,  but his symptoms are worse during the day   Outpatient Medications Prior to Visit  Medication Sig Dispense Refill   ARIPiprazole (ABILIFY) 5 MG tablet Take one tab at bed time 90 tablet 0   atorvastatin (LIPITOR) 80 MG tablet Take 1 tablet (80 mg total) by mouth daily. 180 tablet 3   Blood Glucose Monitoring Suppl (ACCU-CHEK AVIVA PLUS) w/Device KIT Use to check blood sugars up to 4 times daily. ICD-10 E11.40 1 kit 0   buPROPion (WELLBUTRIN XL) 300 MG 24 hr tablet Take 1 tablet (300 mg total) by mouth daily. 90 tablet 0   busPIRone (BUSPAR) 15 MG tablet Take 1 tablet (15 mg total) by mouth 3 (three) times daily. 90 tablet 1   carvedilol (COREG) 12.5 MG tablet Take 1 tablet (12.5 mg total) by mouth 2 (two) times daily. 180 tablet 3    clobetasol (TEMOVATE) 0.05 % external solution Apply 1 application  topically 2 (two) times daily as needed (scalp irritation).     Clobetasol Propionate 0.05 % shampoo Apply to dry scalp and leave on for 15 minutes (Patient taking differently: Apply 1 Application topically daily as needed (scalp irritation).) 118 mL 5   clonazePAM (KLONOPIN) 2 MG tablet Take 0.5 tablets (1 mg total) by mouth 2 (two) times daily as needed for anxiety. 30 tablet 5   dicyclomine (BENTYL) 20 MG tablet TAKE 1 TABLET BY MOUTH EVERY 6 HOURS 120 tablet 0   furosemide (LASIX) 20 MG tablet Take 1 tablet (20 mg total) by mouth 2 (two) times a week. 4520 tablet 3   gabapentin (NEURONTIN) 600 MG tablet Take 1 tablet (600 mg total) by mouth 3 (three) times daily. 270 tablet 0   glucose blood (ACCU-CHEK AVIVA PLUS) test strip Use to check blood sugars up to 4 times daily. ICD-10 E11.40 400 each 0   glucose blood test strip Use once daily to check BS at various times  E11.9 100 each 12   Lancets (ACCU-CHEK MULTICLIX) lancets Use to to check blood sugars up to 4 times daily. ICD-10: E11.40 400 each 0   losartan (COZAAR) 100 MG tablet Take 1 tablet by mouth once daily 90 tablet 3   metFORMIN (GLUCOPHAGE) 500 MG tablet  TAKE 1 TABLET BY MOUTH TWICE DAILY WITH A MEAL 180 tablet 1   methocarbamol (ROBAXIN) 500 MG tablet Take 1 tablet (500 mg total) by mouth every 8 (eight) hours as needed. 90 tablet 2   Multiple Vitamin (MULTIVITAMIN WITH MINERALS) TABS tablet Take 1 tablet by mouth daily.     naloxone (NARCAN) nasal spray 4 mg/0.1 mL Use as needed for excessive sedation /opioid overdose 1 each 1   omeprazole (PRILOSEC) 40 MG capsule Take 1 pill by mouth once in the morning and once in the evening 180 capsule 0   promethazine (PHENERGAN) 12.5 MG tablet Take 1 tablet (12.5 mg total) by mouth every 4 (four) hours as needed for nausea or vomiting. (Patient taking differently: Take 12.5 mg by mouth every 8 (eight) hours as needed for  nausea or vomiting.) 20 tablet 0   traZODone (DESYREL) 100 MG tablet TAKE 1 & 1/2 (ONE & ONE-HALF) TABLETS BY MOUTH AT BEDTIME 135 tablet 2   empagliflozin (JARDIANCE) 10 MG TABS tablet Take 1 tablet (10 mg total) by mouth daily. 30 tablet 2   ezetimibe (ZETIA) 10 MG tablet Take 1 tablet by mouth once daily 90 tablet 0   levothyroxine (SYNTHROID) 88 MCG tablet TAKE 1 TABLET BY MOUTH ONCE DAILY BEFORE BREAKFAST 90 tablet 1   oxyCODONE-acetaminophen (PERCOCET) 5-325 MG tablet 1 po q 12 hrs prn pain (Patient not taking: Reported on 04/19/2022) 25 tablet 0   No facility-administered medications prior to visit.    Review of Systems;  Patient denies headache, fevers, malaise, unintentional weight loss, skin rash, eye pain, sinus congestion and sinus pain, sore throat, dysphagia,  hemoptysis , cough, dyspnea, wheezing, chest pain, palpitations, orthopnea, edema, abdominal pain, nausea, melena, diarrhea, constipation, flank pain, dysuria, hematuria, urinary  Frequency, nocturia, numbness, tingling, seizures,  Focal weakness, Loss of consciousness,  Tremor, insomnia, depression, anxiety, and suicidal ideation.      Objective:  BP (!) 140/84   Pulse 71   Temp 98.2 F (36.8 C) (Oral)   Ht 6' (1.829 m)   Wt 213 lb 12.8 oz (97 kg)   SpO2 96%   BMI 29.00 kg/m   BP Readings from Last 3 Encounters:  04/19/22 (!) 140/84  03/07/22 (!) 170/89  03/02/22 130/82    Wt Readings from Last 3 Encounters:  04/19/22 213 lb 12.8 oz (97 kg)  03/07/22 230 lb (104.3 kg)  03/02/22 230 lb 9.6 oz (104.6 kg)    Physical Exam Vitals reviewed.  Constitutional:      General: He is not in acute distress.    Appearance: Normal appearance. He is normal weight. He is not ill-appearing, toxic-appearing or diaphoretic.  HENT:     Head: Normocephalic.  Eyes:     General: No scleral icterus.       Right eye: No discharge.        Left eye: No discharge.     Conjunctiva/sclera: Conjunctivae normal.   Cardiovascular:     Rate and Rhythm: Normal rate and regular rhythm.     Heart sounds: Normal heart sounds.  Pulmonary:     Effort: Pulmonary effort is normal. No respiratory distress.     Breath sounds: Normal breath sounds.  Musculoskeletal:        General: Normal range of motion.     Cervical back: Normal range of motion.  Skin:    General: Skin is warm and dry.  Neurological:     General: No focal deficit present.  Mental Status: He is alert and oriented to person, place, and time. Mental status is at baseline.  Psychiatric:        Mood and Affect: Mood normal.        Behavior: Behavior normal.        Thought Content: Thought content normal.        Judgment: Judgment normal.     Lab Results  Component Value Date   HGBA1C 6.4 04/19/2022   HGBA1C 6.8 (H) 01/16/2022   HGBA1C 6.8 (H) 09/30/2021    Lab Results  Component Value Date   CREATININE 0.87 04/19/2022   CREATININE 1.00 02/28/2022   CREATININE 1.18 02/01/2022    Lab Results  Component Value Date   WBC 6.2 02/28/2022   HGB 13.9 02/28/2022   HCT 41.0 02/28/2022   PLT 261 02/28/2022   GLUCOSE 101 (H) 04/19/2022   CHOL 110 04/19/2022   TRIG 122.0 04/19/2022   HDL 30.40 (L) 04/19/2022   LDLDIRECT 63.0 04/19/2022   LDLCALC 55 04/19/2022   ALT 21 04/19/2022   AST 19 04/19/2022   NA 138 04/19/2022   K 3.8 04/19/2022   CL 97 04/19/2022   CREATININE 0.87 04/19/2022   BUN 12 04/19/2022   CO2 36 (H) 04/19/2022   TSH 3.10 01/16/2022   PSA 0.70 10/16/2017   INR 0.98 08/27/2015   HGBA1C 6.4 04/19/2022   MICROALBUR <0.7 04/19/2022    No results found.  Assessment & Plan:  .Essential hypertension Assessment & Plan: Not at goal on carvedilol 12.5 mg bid an maximal dose of losartan .  Will increase carvedilol to maximal dose. If not tolerated,  will  add 25. Mg hctz    Lab Results  Component Value Date   CREATININE 0.87 04/19/2022   Lab Results  Component Value Date   NA 138 04/19/2022   K 3.8  04/19/2022   CL 97 04/19/2022   CO2 36 (H) 04/19/2022     Orders: -     Comprehensive metabolic panel  Hyperlipidemia associated with type 2 diabetes mellitus (HCC) -     Lipid panel -     LDL cholesterol, direct  Type 2 diabetes mellitus with diabetic neuropathy, without long-term current use of insulin (HCC) Assessment & Plan: Managed with Jardiance and  metformin  .  Increasing gabapentin to maxmal dose (900 qam and q pm  600 mg qhs) A1c and lipids are at goal.    Lab Results  Component Value Date   HGBA1C 6.4 04/19/2022   Lab Results  Component Value Date   CHOL 110 04/19/2022   HDL 30.40 (L) 04/19/2022   LDLCALC 55 04/19/2022   LDLDIRECT 63.0 04/19/2022   TRIG 122.0 04/19/2022   CHOLHDL 4 04/19/2022      Orders: -     Comprehensive metabolic panel -     Hemoglobin A1c -     Microalbumin / creatinine urine ratio  Hypothyroidism due to acquired atrophy of thyroid Assessment & Plan: Thyroid function is WNL on current dose of 88 mcg.   No current changes needed.    Lab Results  Component Value Date   TSH 3.10 01/16/2022      Nontraumatic complete tear of right rotator cuff Assessment & Plan: S/p right shoulder arthrsocopy Nov 2023    Chronic pain of multiple joints Assessment & Plan: He has  chronic pain and documented objective evidence of DJD and ligament injuries  involving shoulders and knees.  He has a history of overuse of  NSAIDS without relief.  I have agreed to continue  Prescribing hydrocodone for use not more than twice daily and continue celebrex once daily along with a daily PPI     Other orders -     Empagliflozin; Take 1 tablet (10 mg total) by mouth daily.  Dispense: 30 tablet; Refill: 2 -     Ezetimibe; Take 1 tablet (10 mg total) by mouth daily.  Dispense: 90 tablet; Refill: 0 -     Levothyroxine Sodium; Take 1 tablet (88 mcg total) by mouth daily before breakfast.  Dispense: 90 tablet; Refill: 1 -     HYDROcodone-Acetaminophen; Take 1  tablet by mouth 2 (two) times daily as needed.  Dispense: 60 tablet; Refill: 0     I provided 30 minutes of face-to-face time during this encounter reviewing patient's last visit with me, patient's  most recent visit with  orthopedics, cardiology,    and neurology,  recent surgical and non surgical procedures, previous  labs and imaging studies, counseling on currently addressed issues,  and post visit ordering to diagnostics and therapeutics .   Follow-up: Return in about 3 months (around 07/19/2022).   [name], MD

## 2022-04-19 NOTE — Assessment & Plan Note (Addendum)
Not at goal on carvedilol 12.5 mg bid an maximal dose of losartan .  Will increase carvedilol to maximal dose. If not tolerated,  will  add 25. Mg hctz    Lab Results  Component Value Date   CREATININE 0.87 04/19/2022   Lab Results  Component Value Date   NA 138 04/19/2022   K 3.8 04/19/2022   CL 97 04/19/2022   CO2 36 (H) 04/19/2022

## 2022-04-19 NOTE — Assessment & Plan Note (Signed)
Thyroid function is WNL on current dose of 88 mcg.   No current changes needed.    Lab Results  Component Value Date   TSH 3.10 01/16/2022

## 2022-04-19 NOTE — Assessment & Plan Note (Signed)
S/p right shoulder arthrsocopy Nov 2023

## 2022-04-19 NOTE — Patient Instructions (Addendum)
For the blood pressure:  Increase carvedilol to 25 mg twice daily    For the foot pain due to neuropathy,   Increase the gabapentin as follows:   Take 900 mg in the morning and 900 mg in the afternoon, continue 600 mg at night  (Maximal daily  dose of 2400 mg )

## 2022-04-19 NOTE — Assessment & Plan Note (Addendum)
Managed with Jardiance and  metformin  .  Increasing gabapentin to maxmal dose (900 qam and q pm  600 mg qhs) A1c and lipids are at goal.    Lab Results  Component Value Date   HGBA1C 6.4 04/19/2022   Lab Results  Component Value Date   CHOL 110 04/19/2022   HDL 30.40 (L) 04/19/2022   LDLCALC 55 04/19/2022   LDLDIRECT 63.0 04/19/2022   TRIG 122.0 04/19/2022   CHOLHDL 4 04/19/2022

## 2022-04-19 NOTE — Assessment & Plan Note (Signed)
He has  chronic pain and documented objective evidence of DJD and ligament injuries  involving shoulders and knees.  He has a history of overuse of NSAIDS without relief.  I have agreed to continue  Prescribing hydrocodone for use not more than twice daily and continue celebrex once daily along with a daily PPI

## 2022-04-20 ENCOUNTER — Encounter: Payer: Self-pay | Admitting: Internal Medicine

## 2022-04-20 DIAGNOSIS — E1140 Type 2 diabetes mellitus with diabetic neuropathy, unspecified: Secondary | ICD-10-CM

## 2022-04-20 NOTE — Telephone Encounter (Signed)
Do you want to send in a new medication or make a pharmacy referral?

## 2022-04-21 NOTE — Telephone Encounter (Signed)
noted 

## 2022-04-24 NOTE — Progress Notes (Signed)
Cardiology Office Note  Date:  04/25/2022   ID:  [name], DOB 1965-11-20, MRN [id number]  PCP:  [name], MD   Chief Complaint  Patient presents with   12 month follow up     Establish care for CAD; former Dr. Stanford Breed patient. Patient was at [facility] in Oct. 2023 with shortness of breath and chest pain; patient continues to have shortness of breath and chest pain. Medications reviewed by the patient verbally.     HPI:  [name] is a 57 year old gentleman with past medical history of Diabetes type 2 Hyperlipidemia Sleep apnea CAD. Cath 5/17 showed 30 Lcx.  Cardiac CTA 10/20 showed Ca score 50, mild (<50) D1, mild (<50) Lcx and minimal RCA.  Echocardiogram June 2022 showed normal LV function, mild right atrial enlargement.   Venous Dopplers November 2022 showed acute DVT on the left.   Who presents for new patient evaluation to the [facility] office, follow-up of his coronary disease and DVT  Last seen by Dr. Stanford Breed August 2023 Seen for preoperative evaluation March 03, 2022, right shoulder arthroscopy and rotator cuff repair   Chronic dyspnea on exertion   He was does report having some anxiety leading to visit to the emergency room February 12, 2022 Had some chest tightness, workup in the ER negative Troponin negative Very occasional light episodes since that time, nothing consistent  Lab work reviewed A1C 6.4 LDL 63  Getting over shoulder surgery  EKG personally reviewed by myself on todays visit Normal sinus rhythm rate 80 bpm no significant ST-T wave changes  PMH:   has a past medical history of Acute medial meniscus tear of left knee, 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  PSH:    Past Surgical History:  Procedure Laterality Date   APPENDECTOMY  2002   done at [place] N/A 08/27/2015   Procedure: Left Heart Cath and Coronary Angiography;  Surgeon: Jolaine Artist, MD;  Location: [place] [facility];  Service: Cardiovascular;  Laterality: N/A;   COLONOSCOPY  in his 20's   COLONOSCOPY  2019   EAR CYST EXCISION Left 07/08/2019   Procedure: open excision baker's cyst left knee;  Surgeon: Meredith Pel, MD;  Location: [place];  Service: Orthopedics;  Laterality: Left;   HERNIA REPAIR  27/09/2374   umbilical   JOINT REPLACEMENT     right knee   KNEE ARTHROSCOPY Left 12/17/2018   Procedure: left knee arthroscopy, meniscal debridement, loose body removal;  Surgeon: Meredith Pel, MD;  Location: [place];  Service: Orthopedics;  Laterality: Left;   REVISION TOTAL KNEE ARTHROPLASTY Right 06/23/2014   DR Marlou Sa   SHOULDER ARTHROSCOPY WITH ROTATOR CUFF REPAIR AND SUBACROMIAL DECOMPRESSION Right 03/07/2022   Procedure: RIGHT SHOULDER ARTHROSCOPY, DEBRIDEMENT, MINI OPEN ROTATOR CUFF TEAR REPAIR;  Surgeon: Meredith Pel, MD;  Location: [place];  Service: Orthopedics;  Laterality: Right;   TONSILLECTOMY     TOTAL KNEE ARTHROPLASTY Right 2011   right   TOTAL KNEE ARTHROPLASTY Left 02/10/2021   Procedure: LEFT TOTAL KNEE ARTHROPLASTY;  Surgeon: Meredith Pel, MD;  Location: [place];  Service:  Orthopedics;  Laterality: Left;   TOTAL KNEE REVISION Right 06/23/2014   Procedure: TOTAL KNEE REVISION;  Surgeon: Meredith Pel, MD;  Location: [place];  Service: Orthopedics;  Laterality: Right;   UPPER GASTROINTESTINAL ENDOSCOPY  04/30/2013   Judieth Keens TOOTH EXTRACTION       Current Outpatient Medications  Medication Sig Dispense Refill   ARIPiprazole (ABILIFY) 5 MG tablet Take one tab at bed time 90 tablet 0   atorvastatin (LIPITOR) 80 MG tablet Take 1 tablet (80 mg total) by mouth daily. 180 tablet 3   Blood Glucose Monitoring Suppl (ACCU-CHEK AVIVA PLUS) w/Device KIT Use to check blood sugars up to 4 times daily. ICD-10 E11.40 1 kit 0   buPROPion (WELLBUTRIN XL) 300 MG 24 hr tablet Take 1 tablet (300 mg total) by mouth daily. 90 tablet 0   busPIRone (BUSPAR) 15 MG tablet Take 1 tablet (15 mg total) by mouth 3 (three) times daily. 90 tablet 1   clobetasol (TEMOVATE) 0.05 % external solution Apply 1 application  topically 2 (two) times daily as needed (scalp irritation).     Clobetasol Propionate 0.05 % shampoo Apply to dry scalp and leave on for 15 minutes (Patient taking differently: Apply 1 Application topically daily as needed (scalp irritation).) 118 mL 5   clonazePAM (KLONOPIN) 2 MG tablet Take 0.5 tablets (1 mg total) by mouth 2 (two) times daily as needed for anxiety. 30 tablet 5   dicyclomine (BENTYL) 20 MG tablet TAKE 1 TABLET BY MOUTH EVERY 6 HOURS 120 tablet 0   empagliflozin (JARDIANCE) 10 MG TABS tablet Take 1 tablet (10 mg total) by mouth daily. 30 tablet 2   ezetimibe (ZETIA) 10 MG tablet Take 1 tablet (10 mg total) by mouth daily. 90 tablet 0   furosemide (LASIX) 20 MG tablet Take 1 tablet (20 mg total) by mouth 2 (two) times a week. 4520 tablet 3   gabapentin (NEURONTIN) 600 MG tablet Take 1 tablet (600 mg total) by mouth 3 (three) times daily. 270 tablet 0   glucose blood (ACCU-CHEK AVIVA PLUS) test strip Use to check blood sugars up to 4 times daily. ICD-10 E11.40 400 each 0   glucose blood test strip Use once daily to check BS at various times  E11.9 100 each 12   HYDROcodone-acetaminophen (NORCO) 10-325 MG tablet Take 1 tablet by mouth 2 (two) times daily as needed. 60 tablet 0   isosorbide mononitrate (IMDUR) 30 MG 24 hr tablet Take 1  tablet (30 mg total) by mouth daily. 90 tablet 3   Lancets (ACCU-CHEK MULTICLIX) lancets Use to to check blood sugars up to 4 times daily. ICD-10: E11.40 400 each 0   levothyroxine (SYNTHROID) 88 MCG tablet Take 1 tablet (88 mcg total) by mouth daily before breakfast. 90 tablet 1   losartan (COZAAR) 100 MG tablet Take 1 tablet by mouth once daily 90 tablet 3   metFORMIN (GLUCOPHAGE) 500 MG tablet TAKE 1 TABLET BY MOUTH TWICE DAILY WITH A MEAL 180 tablet 1   methocarbamol (ROBAXIN) 500 MG tablet Take 1 tablet (500 mg total) by mouth every 8 (eight) hours as needed. 90 tablet 2   Multiple Vitamin (MULTIVITAMIN WITH MINERALS) TABS tablet Take 1 tablet by mouth daily.     naloxone (NARCAN) nasal spray 4 mg/0.1 mL Use as needed for excessive sedation /opioid overdose 1 each 1   omeprazole (PRILOSEC) 40 MG capsule Take 1 pill by mouth once in the morning and once in the  evening 180 capsule 0   promethazine (PHENERGAN) 12.5 MG tablet Take 1 tablet (12.5 mg total) by mouth every 4 (four) hours as needed for nausea or vomiting. (Patient taking differently: Take 12.5 mg by mouth every 8 (eight) hours as needed for nausea or vomiting.) 20 tablet 0   traZODone (DESYREL) 100 MG tablet TAKE 1 & 1/2 (ONE & ONE-HALF) TABLETS BY MOUTH AT BEDTIME 135 tablet 2   carvedilol (COREG) 12.5 MG tablet Take 1 tablet (12.5 mg total) by mouth 2 (two) times daily. 180 tablet 3   No current facility-administered medications for this visit.     Allergies:   Ambien [zolpidem]   Social History:  The patient  reports that he quit smoking about 17 years ago. His smoking use included cigarettes. He has a 36.00 pack-year smoking history. He has never used smokeless tobacco. He reports current alcohol use of about 4.0 standard drinks of alcohol per week. He reports that he does not use drugs.   Family History:   family history includes Heart disease in his father.    Review of Systems: Review of Systems  Constitutional:  Negative.   HENT: Negative.    Respiratory: Negative.    Cardiovascular:  Positive for chest pain.  Gastrointestinal: Negative.   Musculoskeletal: Negative.   Neurological: Negative.   Psychiatric/Behavioral: Negative.    All other systems reviewed and are negative.    PHYSICAL EXAM: VS:  BP (!) 156/80 (BP Location: Left Arm, Patient Position: Sitting, Cuff Size: Normal)   Pulse 80   Ht 6' (1.829 m)   Wt 225 lb 4 oz (102.2 kg)   SpO2 98%   BMI 30.55 kg/m  , BMI Body mass index is 30.55 kg/m. GEN: Well nourished, well developed, in no acute distress HEENT: normal Neck: no JVD, carotid bruits, or masses Cardiac: RRR; no murmurs, rubs, or gallops,no edema  Respiratory:  clear to auscultation bilaterally, normal work of breathing GI: soft, nontender, nondistended, + BS MS: no deformity or atrophy Skin: warm and dry, no rash Neuro:  Strength and sensation are intact Psych: euthymic mood, full affect   Recent Labs: 01/16/2022: TSH 3.10 02/28/2022: Hemoglobin 13.9; Platelets 261 04/19/2022: ALT 21; BUN 12; Creatinine, Ser 0.87; Potassium 3.8; Sodium 138    Lipid Panel Lab Results  Component Value Date   CHOL 110 04/19/2022   HDL 30.40 (L) 04/19/2022   LDLCALC 55 04/19/2022   TRIG 122.0 04/19/2022      Wt Readings from Last 3 Encounters:  04/25/22 225 lb 4 oz (102.2 kg)  04/19/22 213 lb 12.8 oz (97 kg)  03/07/22 230 lb (104.3 kg)      ASSESSMENT AND PLAN:  Problem List Items Addressed This Visit       Cardiology Problems   CAD (coronary artery disease)   Relevant Medications   isosorbide mononitrate (IMDUR) 30 MG 24 hr tablet   carvedilol (COREG) 12.5 MG tablet   Other Relevant Orders   EKG 12-Lead   Essential hypertension   Relevant Medications   isosorbide mononitrate (IMDUR) 30 MG 24 hr tablet   carvedilol (COREG) 12.5 MG tablet   Other Relevant Orders   EKG 12-Lead   Hyperlipidemia associated with type 2 diabetes mellitus (HCC)   Relevant  Medications   isosorbide mononitrate (IMDUR) 30 MG 24 hr tablet   carvedilol (COREG) 12.5 MG tablet   Other Relevant Orders   EKG 12-Lead     Other   Type 2 diabetes mellitus with diabetic neuropathy, unspecified (  [place]) - Primary   Relevant Orders   EKG 12-Lead   1+ pitting edema   History of deep venous thrombosis (DVT) of distal vein of left lower extremity    Coronary artery disease Prior workup including cardiac catheterization and cardiac CTA showing nonobstructive disease Recent trip to the emergency room for chest pain workup, nothing acute noted Recommend he start isosorbide mononitrate 30 mg daily for blood pressure and possible coronary spasm  Diabetes type 2 with complications Y3K relatively well-controlled  Hyperlipidemia Cholesterol is at goal on the current lipid regimen. No changes to the medications were made.    Total encounter time more than 30 minutes  Greater than 50% was spent in counseling and coordination of care with the patient    Signed, Esmond Plants, M.D., Ph.D. [place], [place]

## 2022-04-25 ENCOUNTER — Other Ambulatory Visit: Payer: Self-pay | Admitting: Internal Medicine

## 2022-04-25 ENCOUNTER — Encounter: Payer: Self-pay | Admitting: Cardiovascular Disease

## 2022-04-25 ENCOUNTER — Ambulatory Visit: Payer: Medicare HMO | Attending: Cardiovascular Disease | Admitting: Cardiovascular Disease

## 2022-04-25 VITALS — BP 156/80 | HR 80 | Ht 72.0 in | Wt 225.2 lb

## 2022-04-25 DIAGNOSIS — E785 Hyperlipidemia, unspecified: Secondary | ICD-10-CM | POA: Diagnosis not present

## 2022-04-25 DIAGNOSIS — Z86718 Personal history of other venous thrombosis and embolism: Secondary | ICD-10-CM

## 2022-04-25 DIAGNOSIS — E1169 Type 2 diabetes mellitus with other specified complication: Secondary | ICD-10-CM | POA: Diagnosis not present

## 2022-04-25 DIAGNOSIS — F331 Major depressive disorder, recurrent, moderate: Secondary | ICD-10-CM

## 2022-04-25 DIAGNOSIS — I25118 Atherosclerotic heart disease of native coronary artery with other forms of angina pectoris: Secondary | ICD-10-CM | POA: Diagnosis not present

## 2022-04-25 DIAGNOSIS — E1140 Type 2 diabetes mellitus with diabetic neuropathy, unspecified: Secondary | ICD-10-CM | POA: Diagnosis not present

## 2022-04-25 DIAGNOSIS — I10 Essential (primary) hypertension: Secondary | ICD-10-CM

## 2022-04-25 DIAGNOSIS — R609 Edema, unspecified: Secondary | ICD-10-CM | POA: Diagnosis not present

## 2022-04-25 DIAGNOSIS — F411 Generalized anxiety disorder: Secondary | ICD-10-CM

## 2022-04-25 MED ORDER — CARVEDILOL 12.5 MG PO TABS: 12.5000 mg | ORAL_TABLET | Freq: Two times a day (BID) | ORAL | 3 refills | Status: DC

## 2022-04-25 MED ORDER — ISOSORBIDE MONONITRATE ER 30 MG PO TB24: 30.0000 mg | ORAL_TABLET | Freq: Every day | ORAL | 3 refills | Status: DC

## 2022-04-25 NOTE — Patient Instructions (Addendum)
Medication Instructions:  Please start imdur/isosorbide 30 mg daily  If you need a refill on your cardiac medications before your next appointment, please call your pharmacy.   Lab work: No new labs needed  Testing/Procedures: No new testing needed  Follow-Up: At [facility], you and your health needs are our priority.  As part of our continuing mission to provide you with exceptional heart care, we have created designated Provider Care Teams.  These Care Teams include your primary Cardiologist (physician) and Advanced Practice Providers (APPs -  Physician Assistants and Nurse Practitioners) who all work together to provide you with the care you need, when you need it.  You will need a follow up appointment in 12 months  Providers on your designated Care Team:   Murray Hodgkins, NP Christell Faith, PA-C Cadence Kathlen Mody, [place]  COVID-19 Vaccine Information can be found at: [email or web address] For questions related to vaccine distribution or appointments, please email [email or web address] or call [phone].

## 2022-04-26 ENCOUNTER — Encounter: Payer: Self-pay | Admitting: Internal Medicine

## 2022-04-26 DIAGNOSIS — F411 Generalized anxiety disorder: Secondary | ICD-10-CM

## 2022-04-26 DIAGNOSIS — F331 Major depressive disorder, recurrent, moderate: Secondary | ICD-10-CM

## 2022-04-26 MED ORDER — TRAZODONE HCL 100 MG PO TABS: ORAL_TABLET | ORAL | 2 refills | Status: DC

## 2022-05-02 ENCOUNTER — Telehealth: Payer: Self-pay | Admitting: Pharmacist

## 2022-05-02 NOTE — Telephone Encounter (Signed)
Received referral from PCP regarding medication management; however, went to CCM referral queue so I was unaware of it until today. Appointment scheduled with my colleague tomorrow.   Catie Hedwig Morton, PharmD, [place], [facility] [phone]

## 2022-05-03 ENCOUNTER — Encounter: Payer: Self-pay | Admitting: Orthopedic Surgery

## 2022-05-03 ENCOUNTER — Other Ambulatory Visit: Payer: Medicare HMO

## 2022-05-03 ENCOUNTER — Ambulatory Visit: Payer: Medicare HMO | Admitting: Orthopedic Surgery

## 2022-05-03 DIAGNOSIS — L578 Other skin changes due to chronic exposure to nonionizing radiation: Secondary | ICD-10-CM | POA: Diagnosis not present

## 2022-05-03 DIAGNOSIS — D2261 Melanocytic nevi of right upper limb, including shoulder: Secondary | ICD-10-CM | POA: Diagnosis not present

## 2022-05-03 DIAGNOSIS — D225 Melanocytic nevi of trunk: Secondary | ICD-10-CM | POA: Diagnosis not present

## 2022-05-03 DIAGNOSIS — C44529 Squamous cell carcinoma of skin of other part of trunk: Secondary | ICD-10-CM | POA: Diagnosis not present

## 2022-05-03 DIAGNOSIS — D2271 Melanocytic nevi of right lower limb, including hip: Secondary | ICD-10-CM | POA: Diagnosis not present

## 2022-05-03 DIAGNOSIS — D485 Neoplasm of uncertain behavior of skin: Secondary | ICD-10-CM | POA: Diagnosis not present

## 2022-05-03 DIAGNOSIS — D2262 Melanocytic nevi of left upper limb, including shoulder: Secondary | ICD-10-CM | POA: Diagnosis not present

## 2022-05-03 DIAGNOSIS — M75121 Complete rotator cuff tear or rupture of right shoulder, not specified as traumatic: Secondary | ICD-10-CM

## 2022-05-03 DIAGNOSIS — D2272 Melanocytic nevi of left lower limb, including hip: Secondary | ICD-10-CM | POA: Diagnosis not present

## 2022-05-03 NOTE — Progress Notes (Addendum)
Chronic Care Management Pharmacy Note  05/03/2022 Name:  [name] MRN:  [id number] DOB:  08/18/1965  Summary: Pharmacy referral for medication management- patient cannot afford Jardiance.  Upon patient assistance evaluation, he would be eligible for manufacturer assistance.  Sent the patient the link to access this via MyChart- also routing to patient assistance technician to assist in process.  Patient was also told to increase carvedilol to '25mg'$  twice daily and gabapentin '900mg'$  in the morning and afternoon and '600mg'$  at night; but no new prescriptions were sent in to pharmacy.  Recommendations/Changes made from today's visit: Patient assistance team to assist patient with application and obtainment of Jardiance. Dr. Derrel Nip to consider sending new prescriptions for carvedilol & gabapentin to patient's pharmacy.   Plan: Pharmacy to schedule follow-up with patient after his next PCP visit in March to verify able to obtain Jardiance and check on status of BP and BG control.  Subjective: [name] is an 57 y.o. year old male who is a primary patient of Joshua Pheasant, MD.  The patient was referred to pharmacy for assistance with medication management.  Engaged with patient by telephone for initial visit in response to provider referral from Dr. Derrel Nip.  Objective:  LABS:  Lab Results  Component Value Date   CREATININE 0.87 04/19/2022   CREATININE 1.00 02/28/2022   CREATININE 1.18 02/01/2022     Lab Results  Component Value Date   HGBA1C 6.4 04/19/2022         Component Value Date/Time   CHOL 110 04/19/2022 0955   TRIG 122.0 04/19/2022 0955   HDL 30.40 (L) 04/19/2022 0955   CHOLHDL 4 04/19/2022 0955   VLDL 24.4 04/19/2022 0955   LDLCALC 55 04/19/2022 0955   LDLDIRECT 63.0 04/19/2022 0955     BP Readings from Last 3 Encounters:  04/25/22 (!) 156/80  04/19/22 (!) 140/84  03/07/22 (!) 170/89    Allergies  Allergen Reactions   Ambien [Zolpidem] Swelling and  Other (See Comments)    Swelling in the throat    Medications Reviewed Today     Reviewed by Meredith Pel, MD (Physician) on 05/03/22 at [place] List Status: <None>   Medication Order Taking? Sig Documenting Provider Last Dose Status Informant  ARIPiprazole (ABILIFY) 5 MG tablet [id number] No Take one tab at bed time Arfeen, Arlyce Harman, MD Taking Active   atorvastatin (LIPITOR) 80 MG tablet [id number] No Take 1 tablet (80 mg total) by mouth daily. Joshua Mc, MD Taking Active Self  Blood Glucose Monitoring Suppl (ACCU-CHEK AVIVA PLUS) w/Device KIT [id number] No Use to check blood sugars up to 4 times daily. ICD-10 E11.40 Joshua Mc, MD Taking Active Self           Med Note Colin Rhein, Patricie Geeslin A   Wed May 03, 2022  9:32 AM) Patient states testing twice daily  buPROPion (WELLBUTRIN XL) 300 MG 24 hr tablet [id number] No Take 1 tablet (300 mg total) by mouth daily. Joshua Nations, MD Taking Active   busPIRone (BUSPAR) 15 MG tablet [id number] No Take 1 tablet (15 mg total) by mouth 3 (three) times daily. Joshua Mc, MD Taking Active Self  carvedilol (COREG) 12.5 MG tablet [id number]  Take 1 tablet (12.5 mg total) by mouth 2 (two) times daily. Minna Merritts, MD  Active   clonazePAM Bobbye [place]) 2 MG tablet [id number] No Take 0.5 tablets (1 mg total) by mouth 2 (two) times daily as needed for anxiety. [name]  L, MD Taking Active Self  dicyclomine (BENTYL) 20 MG tablet [id number] No TAKE 1 TABLET BY MOUTH EVERY 6 HOURS Joshua Mc, MD Taking Active   ezetimibe (ZETIA) 10 MG tablet [id number] No Take 1 tablet (10 mg total) by mouth daily. Joshua Mc, MD Taking Active   furosemide (LASIX) 20 MG tablet [id number] No Take 1 tablet (20 mg total) by mouth 2 (two) times a week. Joshua Mc, MD Taking Active Self           Med Note [name] Feb 22, 2022  4:47 PM) Monday and Thursday  glucose blood (ACCU-CHEK AVIVA PLUS) test strip [id number] No Use to check blood sugars  up to 4 times daily. ICD-10 E11.40 Joshua Mc, MD Taking Active Self           Med Note Colin Rhein, Taaliyah Delpriore A   Wed May 03, 2022  9:31 AM) Checking twice daily  HYDROcodone-acetaminophen (NORCO) 10-325 MG tablet [id number] No Take 1 tablet by mouth 2 (two) times daily as needed. Joshua Mc, MD Taking Active   isosorbide mononitrate (IMDUR) 30 MG 24 hr tablet [id number]  Take 1 tablet (30 mg total) by mouth daily. Minna Merritts, MD  Active   Lancets [name] MULTICLIX) lancets [id number] No Use to to check blood sugars up to 4 times daily. ICD-10: E11.40 Joshua Mc, MD Taking Active Self  levothyroxine (SYNTHROID) 88 MCG tablet [id number] No Take 1 tablet (88 mcg total) by mouth daily before breakfast. Joshua Mc, MD Taking Active   losartan (COZAAR) 100 MG tablet [id number] No Take 1 tablet by mouth once daily Joshua Mc, MD Taking Active Self  metFORMIN (GLUCOPHAGE) 500 MG tablet [id number] No TAKE 1 TABLET BY MOUTH TWICE DAILY WITH A MEAL Joshua Mc, MD Taking Active Self  methocarbamol (ROBAXIN) 500 MG tablet [id number] No Take 1 tablet (500 mg total) by mouth every 8 (eight) hours as needed. Joshua Mc, MD Taking Active Self  Multiple Vitamin (MULTIVITAMIN WITH MINERALS) TABS tablet [id number] No Take 1 tablet by mouth daily. [provider] Taking Active Self  naloxone [facility]) nasal spray 4 mg/0.1 mL [id number] No Use as needed for excessive sedation /opioid overdose Joshua Mc, MD Taking Active Self           Med Note Colin Rhein, [name] A   Wed May 03, 2022  9:50 AM) On hand if needed  omeprazole (PRILOSEC) 40 MG capsule [id number] No Take 1 pill by mouth once in the morning and once in the evening Joshua Mc, MD Taking Active   promethazine (PHENERGAN) 12.5 MG tablet [id number] No Take 1 tablet (12.5 mg total) by mouth every 4 (four) hours as needed for nausea or vomiting.  Patient taking differently: Take 12.5 mg by mouth every 8 (eight) hours as  needed for nausea or vomiting.   Joshua Mc, MD Taking Active Self  traZODone (DESYREL) 100 MG tablet [id number]  TAKE 1 & 1/2 (ONE & ONE-HALF) TABLETS BY MOUTH AT BEDTIME Joshua Mc, MD  Active             Plan: Pharmacy to follow up on new scripts for gabapentin and carvedilol and access to Jardiance.  Once completed, will schedule phone follow up with patient after PCP visit in March.    05/03/2022  Patient ID: Sunday Corn, male   DOB: 1965/05/05, 57 y.o.   MRN: [id number]

## 2022-05-03 NOTE — Progress Notes (Signed)
Post-Op Visit Note   Patient: [name]           Date of Birth: May 16, 1965           MRN: [id number] Visit Date: 05/03/2022 PCP: Einar Pheasant, MD   Assessment & Plan:  Chief Complaint:  Chief Complaint  Patient presents with   Right Shoulder - Routine Post Op   Visit Diagnoses:  1. Complete tear of right rotator cuff, unspecified whether traumatic     Plan: See note dated from this day already dictated  Follow-Up Instructions: No follow-ups on file.   Orders:  No orders of the defined types were placed in this encounter.  No orders of the defined types were placed in this encounter.   Imaging: No results found.  PMFS History: Patient Active Problem List   Diagnosis Date Noted   Pernicious anemia 10/12/2021   Chronic pain associated with significant psychosocial dysfunction 03/05/2021   History of deep venous thrombosis (DVT) of distal vein of left lower extremity 03/05/2021   Arthritis of left knee    S/P TKR (total knee replacement), left 02/10/2021   Grief counseling 11/14/2020   Abnormal electrocardiogram (ECG) (EKG) 09/02/2020   Preoperative evaluation to rule out surgical contraindication 09/02/2020   MDD (major depressive disorder), recurrent episode, moderate ([place]) 05/29/2020   Colon cancer screening 02/02/2020   Eczema of both external ears 12/08/2019   1+ pitting edema 12/08/2019   Polyarthritis of multiple sites 10/27/2019   CAD (coronary artery disease) 10/27/2019   Radiculopathy, cervical region 06/10/2019   Carpal tunnel syndrome, left upper limb 06/10/2019   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   Spinal stenosis of cervical region 12/31/2018   Loose body of left knee    Plantar fasciitis of left foot 08/24/2017   Chronic pain of both hips 06/20/2017   Nontraumatic complete tear of right rotator cuff 09/14/2015   Hypothyroidism due to acquired atrophy of thyroid 09/14/2015   Arthritis of knee 06/23/2014   Encounter for preventive  health examination 06/17/2014   Enlarged RV (right ventricle) 06/17/2014   GERD (gastroesophageal reflux disease) 04/20/2014   Cervical spine degeneration 02/17/2014   Overweight 10/29/2013   Chronic knee pain 10/29/2013   Type 2 diabetes mellitus with diabetic neuropathy, unspecified ([place]) 10/23/2012   Seborrheic dermatitis, unspecified 05/17/2012   Fatty liver 04/22/2012   Hypogonadism male 03/12/2012   Chronic pain of multiple joints 12/24/2010   Sciatica of left side associated with disorder of lumbar spine 12/24/2010   Hyperlipidemia associated with type 2 diabetes mellitus ([place]) 01/13/2009   Anxiety state 01/13/2009   Essential hypertension 01/13/2009   OSA (obstructive sleep apnea) 01/13/2009   Past Medical History:  Diagnosis Date   Acute medial meniscus tear of left knee    Alcohol-induced chronic pancreatitis ([place]) 11/20/2016   Anxiety    Anxiety and depression    Arthritis    knees and shoulders   Benzodiazepine withdrawal with complication ([place]) 40/98/1191   Bruit    L   Chest pain    hx   Cognitive complaints with normal neuropsychological exam 10/23/2016   Diabetes mellitus without complication ([place])    "borderline", diet controlled, no meds, patient has lost 30 lbs   Edema    Fatty liver    GERD (gastroesophageal reflux disease)    uses Omeprazole   Goiter    HLD (hyperlipidemia)    Hypertension    essential, benign   Hypothyroidism    Impotence of organic origin  Murmur    never has caused any problems   Neuromuscular disorder (HCC)    neuropathy bilateral feet   Other chest pain    tightness, pressure   Palpitation    hx   Precordial pain    Sleep apnea    uses cpap    Family History  Problem Relation Age of Onset   Heart disease Father    Colon cancer Neg Hx    Colon polyps Neg Hx    Esophageal cancer Neg Hx    Rectal cancer Neg Hx    Stomach cancer Neg Hx     Past Surgical History:  Procedure Laterality Date   APPENDECTOMY  2002    done at [facility] N/A 08/27/2015   Procedure: Left Heart Cath and Coronary Angiography;  Surgeon: Jolaine Artist, MD;  Location: [facility];  Service: Cardiovascular;  Laterality: N/A;   COLONOSCOPY  in his 20's   COLONOSCOPY  2019   EAR CYST EXCISION Left 07/08/2019   Procedure: open excision baker's cyst left knee;  Surgeon: Meredith Pel, MD;  Location: [facility];  Service: Orthopedics;  Laterality: Left;   HERNIA REPAIR  42/35/3614   umbilical   JOINT REPLACEMENT     right knee   KNEE ARTHROSCOPY Left 12/17/2018   Procedure: left knee arthroscopy, meniscal debridement, loose body removal;  Surgeon: Meredith Pel, MD;  Location: [facility];  Service: Orthopedics;  Laterality: Left;   REVISION TOTAL KNEE ARTHROPLASTY Right 06/23/2014   DR Marlou Sa   SHOULDER ARTHROSCOPY WITH ROTATOR CUFF REPAIR AND SUBACROMIAL DECOMPRESSION Right 03/07/2022   Procedure: RIGHT SHOULDER ARTHROSCOPY, DEBRIDEMENT, MINI OPEN ROTATOR CUFF TEAR REPAIR;  Surgeon: Meredith Pel, MD;  Location: [facility];  Service: Orthopedics;  Laterality: Right;   TONSILLECTOMY     TOTAL KNEE ARTHROPLASTY Right 2011   right   TOTAL KNEE ARTHROPLASTY Left 02/10/2021   Procedure: LEFT TOTAL KNEE ARTHROPLASTY;  Surgeon: Meredith Pel, MD;  Location: [facility];  Service: Orthopedics;  Laterality: Left;   TOTAL KNEE REVISION Right 06/23/2014   Procedure: TOTAL KNEE REVISION;  Surgeon: Meredith Pel, MD;  Location: [facility];  Service: Orthopedics;  Laterality: Right;   UPPER GASTROINTESTINAL ENDOSCOPY  04/30/2013   Judieth Keens TOOTH EXTRACTION     Social History   Occupational History   Occupation: Personnel officer: Public relations account executive   Occupation: SERVICE DIRECTOR    Employer: [employer]  Tobacco Use   Smoking status: Former    Packs/day: 2.00    Years: 18.00    Total pack years: 36.00    Types: Cigarettes     Quit date: 04/24/2005    Years since quitting: 17.0   Smokeless tobacco: Never   Tobacco comments:    quit 20 years ago   Vaping Use   Vaping Use: Never used  Substance and Sexual Activity   Alcohol use: Yes    Alcohol/week: 4.0 standard drinks of alcohol    Types: 4 Cans of beer per week    Comment: Occasionally   Drug use: No   Sexual activity: Yes      Post-Op Visit Note   Patient: [name]           Date of Birth: 1965/07/21           MRN: [id number] Visit Date: 05/03/2022 PCP: Einar Pheasant, MD  Assessment & Plan:  Chief Complaint:  Chief Complaint  Patient presents with   Right Shoulder - Routine Post Op   Visit Diagnoses:  1. Complete tear of right rotator cuff, unspecified whether traumatic     Plan: [name] is a patient who is now about 2 months out right shoulder massive rotator cuff tear repair and debridement.  Having little bit of burning on the superior aspect of the shoulder with range of motion.  Takes hydrocodone as needed.  He is doing his own physical therapy.  On examination he has good range of motion with no coarse grinding or crepitus.  Currently passive range of motion is 60/100/160.  Rotator cuff strength is 4+ out of 5 to external rotation with 5+ out of 5 subscap.  No Popeye deformity present.  He is sleeping well.  Wants to play golf.  At this time I am going to keep him from lifting anything more than 5 pounds for the next 5 weeks.  Come back for clinical recheck and advancement of activities in 5 weeks.  Follow-Up Instructions: No follow-ups on file.   Orders:  No orders of the defined types were placed in this encounter.  No orders of the defined types were placed in this encounter.   Imaging: No results found.  PMFS History: Patient Active Problem List   Diagnosis Date Noted   Pernicious anemia 10/12/2021   Chronic pain associated with significant psychosocial dysfunction 03/05/2021   History of deep venous thrombosis (DVT) of  distal vein of left lower extremity 03/05/2021   Arthritis of left knee    S/P TKR (total knee replacement), left 02/10/2021   Grief counseling 11/14/2020   Abnormal electrocardiogram (ECG) (EKG) 09/02/2020   Preoperative evaluation to rule out surgical contraindication 09/02/2020   MDD (major depressive disorder), recurrent episode, moderate ([place]) 05/29/2020   Colon cancer screening 02/02/2020   Eczema of both external ears 12/08/2019   1+ pitting edema 12/08/2019   Polyarthritis of multiple sites 10/27/2019   CAD (coronary artery disease) 10/27/2019   Radiculopathy, cervical region 06/10/2019   Carpal tunnel syndrome, left upper limb 06/10/2019   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   Spinal stenosis of cervical region 12/31/2018   Loose body of left knee    Plantar fasciitis of left foot 08/24/2017   Chronic pain of both hips 06/20/2017   Nontraumatic complete tear of right rotator cuff 09/14/2015   Hypothyroidism due to acquired atrophy of thyroid 09/14/2015   Arthritis of knee 06/23/2014   Encounter for preventive health examination 06/17/2014   Enlarged RV (right ventricle) 06/17/2014   GERD (gastroesophageal reflux disease) 04/20/2014   Cervical spine degeneration 02/17/2014   Overweight 10/29/2013   Chronic knee pain 10/29/2013   Type 2 diabetes mellitus with diabetic neuropathy, unspecified ([place]) 10/23/2012   Seborrheic dermatitis, unspecified 05/17/2012   Fatty liver 04/22/2012   Hypogonadism male 03/12/2012   Chronic pain of multiple joints 12/24/2010   Sciatica of left side associated with disorder of lumbar spine 12/24/2010   Hyperlipidemia associated with type 2 diabetes mellitus ([place]) 01/13/2009   Anxiety state 01/13/2009   Essential hypertension 01/13/2009   OSA (obstructive sleep apnea) 01/13/2009   Past Medical History:  Diagnosis Date   Acute medial meniscus tear of left knee    Alcohol-induced chronic pancreatitis ([place]) 11/20/2016   Anxiety    Anxiety and  depression    Arthritis    knees and shoulders   Benzodiazepine withdrawal with complication ([place]) 53/97/6734   Bruit  L   Chest pain    hx   Cognitive complaints with normal neuropsychological exam 10/23/2016   Diabetes mellitus without complication (HCC)    "borderline", diet controlled, no meds, patient has lost 30 lbs   Edema    Fatty liver    GERD (gastroesophageal reflux disease)    uses Omeprazole   Goiter    HLD (hyperlipidemia)    Hypertension    essential, benign   Hypothyroidism    Impotence of organic origin    Murmur    never has caused any problems   Neuromuscular disorder (HCC)    neuropathy bilateral feet   Other chest pain    tightness, pressure   Palpitation    hx   Precordial pain    Sleep apnea    uses cpap    Family History  Problem Relation Age of Onset   Heart disease Father    Colon cancer Neg Hx    Colon polyps Neg Hx    Esophageal cancer Neg Hx    Rectal cancer Neg Hx    Stomach cancer Neg Hx     Past Surgical History:  Procedure Laterality Date   APPENDECTOMY  2002   done at [facility] N/A 08/27/2015   Procedure: Left Heart Cath and Coronary Angiography;  Surgeon: Jolaine Artist, MD;  Location: [facility];  Service: Cardiovascular;  Laterality: N/A;   COLONOSCOPY  in his 20's   COLONOSCOPY  2019   EAR CYST EXCISION Left 07/08/2019   Procedure: open excision baker's cyst left knee;  Surgeon: Meredith Pel, MD;  Location: [facility];  Service: Orthopedics;  Laterality: Left;   HERNIA REPAIR  17/79/3903   umbilical   JOINT REPLACEMENT     right knee   KNEE ARTHROSCOPY Left 12/17/2018   Procedure: left knee arthroscopy, meniscal debridement, loose body removal;  Surgeon: Meredith Pel, MD;  Location: [facility];  Service: Orthopedics;  Laterality: Left;   REVISION TOTAL KNEE ARTHROPLASTY Right 06/23/2014   DR Marlou Sa   SHOULDER ARTHROSCOPY WITH ROTATOR CUFF REPAIR AND  SUBACROMIAL DECOMPRESSION Right 03/07/2022   Procedure: RIGHT SHOULDER ARTHROSCOPY, DEBRIDEMENT, MINI OPEN ROTATOR CUFF TEAR REPAIR;  Surgeon: Meredith Pel, MD;  Location: [facility];  Service: Orthopedics;  Laterality: Right;   TONSILLECTOMY     TOTAL KNEE ARTHROPLASTY Right 2011   right   TOTAL KNEE ARTHROPLASTY Left 02/10/2021   Procedure: LEFT TOTAL KNEE ARTHROPLASTY;  Surgeon: Meredith Pel, MD;  Location: [facility];  Service: Orthopedics;  Laterality: Left;   TOTAL KNEE REVISION Right 06/23/2014   Procedure: TOTAL KNEE REVISION;  Surgeon: Meredith Pel, MD;  Location: [facility];  Service: Orthopedics;  Laterality: Right;   UPPER GASTROINTESTINAL ENDOSCOPY  04/30/2013   Judieth Keens TOOTH EXTRACTION     Social History   Occupational History   Occupation: Personnel officer: Public relations account executive   Occupation: SERVICE DIRECTOR    Employer: [employer]  Tobacco Use   Smoking status: Former    Packs/day: 2.00    Years: 18.00    Total pack years: 36.00    Types: Cigarettes    Quit date: 04/24/2005    Years since quitting: 17.0   Smokeless tobacco: Never   Tobacco comments:    quit 20 years ago   Vaping Use   Vaping Use: Never used  Substance and Sexual Activity  Alcohol use: Yes    Alcohol/week: 4.0 standard drinks of alcohol    Types: 4 Cans of beer per week    Comment: Occasionally   Drug use: No   Sexual activity: Yes

## 2022-05-03 NOTE — Patient Instructions (Addendum)
Mr. Mckim,  It was a pleasure to speak with you today.  I am providing the link to the medication assistance application for Jardiance below.  I will also be contacting our medication access team to assist you with getting this completed.  I have also reached out to Dr. Derrel Nip to keep her in the loop, let her know new scripts for gabapentin and carvedilol need to be sent to the pharmacy, and share my thoughts on potentially changing your metformin dose.  For now, continue taking the losartan as directed since there seems to be an intended increase in your carvedilol.  I don't want your pressures going too low.  As a reminder, it is a great idea to monitor these regularly if you are able to find a blood pressure cuff at your [facility].  I do recommend an automatic cuff that goes on the upper arm versus the wrist.  I will reach out to you as soon as I see Dr. Derrel Nip has reviewed today's notes.  Thank you!  [email or web address]   Malachy Mood

## 2022-05-07 MED ORDER — GABAPENTIN 300 MG PO CAPS: ORAL_CAPSULE | ORAL | 5 refills | Status: DC

## 2022-05-07 NOTE — Addendum Note (Signed)
Addended by: [name] on: 05/07/2022 09:28 PM   Modules accepted: Orders

## 2022-05-08 ENCOUNTER — Ambulatory Visit: Payer: Medicare HMO

## 2022-05-08 ENCOUNTER — Other Ambulatory Visit (HOSPITAL_COMMUNITY): Payer: Self-pay

## 2022-05-11 ENCOUNTER — Telehealth: Payer: Self-pay

## 2022-05-11 NOTE — Telephone Encounter (Signed)
Mailing [employer] CARES application for Jardiance to pt home.

## 2022-05-14 ENCOUNTER — Other Ambulatory Visit: Payer: Self-pay | Admitting: Internal Medicine

## 2022-05-15 ENCOUNTER — Other Ambulatory Visit: Payer: Self-pay | Admitting: Internal Medicine

## 2022-05-15 DIAGNOSIS — F411 Generalized anxiety disorder: Secondary | ICD-10-CM

## 2022-05-15 DIAGNOSIS — F331 Major depressive disorder, recurrent, moderate: Secondary | ICD-10-CM

## 2022-05-25 ENCOUNTER — Encounter: Payer: Self-pay | Admitting: Internal Medicine

## 2022-05-26 ENCOUNTER — Other Ambulatory Visit: Payer: Self-pay

## 2022-05-26 MED ORDER — METHOCARBAMOL 500 MG PO TABS: 500.0000 mg | ORAL_TABLET | Freq: Three times a day (TID) | ORAL | 2 refills | Status: DC | PRN

## 2022-05-26 MED ORDER — HYDROCODONE-ACETAMINOPHEN 10-325 MG PO TABS: 1.0000 | ORAL_TABLET | Freq: Two times a day (BID) | ORAL | 0 refills | Status: DC | PRN

## 2022-05-26 NOTE — Telephone Encounter (Signed)
Pt advised Will come up to office next week to sign

## 2022-05-26 NOTE — Telephone Encounter (Signed)
Sent to Dr Tullo for approval 

## 2022-05-28 ENCOUNTER — Encounter: Payer: Self-pay | Admitting: Orthopedic Surgery

## 2022-05-29 ENCOUNTER — Ambulatory Visit: Payer: Medicare HMO | Admitting: Surgical

## 2022-05-30 ENCOUNTER — Telehealth: Payer: Self-pay

## 2022-05-30 NOTE — Telephone Encounter (Signed)
Controlled substances agreement's signed.

## 2022-06-02 ENCOUNTER — Ambulatory Visit: Payer: Medicare HMO | Admitting: Surgical

## 2022-06-02 ENCOUNTER — Ambulatory Visit (INDEPENDENT_AMBULATORY_CARE_PROVIDER_SITE_OTHER): Payer: Medicare HMO

## 2022-06-02 ENCOUNTER — Ambulatory Visit: Payer: Self-pay

## 2022-06-02 DIAGNOSIS — M75121 Complete rotator cuff tear or rupture of right shoulder, not specified as traumatic: Secondary | ICD-10-CM

## 2022-06-03 ENCOUNTER — Encounter: Payer: Self-pay | Admitting: Surgical

## 2022-06-03 NOTE — Progress Notes (Signed)
Office Visit Note   Patient: [name]           Date of Birth: [birth year]           MRN: [id number] Visit Date: 06/02/2022 Requested by: [name], MD [address] [place],  [place] [address] PCP: [name], MD  Subjective: Chief Complaint  Patient presents with   Right Shoulder - Pain    HPI: [name] is a 57 y.o. male who presents to the office reporting right shoulder pain.  He has history of prior rotator cuff revision repair of right shoulder rotator cuff tear including the supraspinatus and infraspinatus tendons on 03/07/2022.  Infraspinatus had moderate amount of atrophy at that time.  He was doing well recovering from surgery up until about a week ago when he woke up with severe right shoulder pain and difficulty lifting his arm.  Localizes pain to the lateral aspect of the shoulder.  Does not really radiate down the arm but does radiate slightly into the back of the shoulder but not to the shoulder blade.  No shoulder blade pain, neck pain, numbness/tingling, burning sensation.  He states that he was not really doing anything physically intensive.  Does have a little bit of trapezius pain.  Takes hydrocodone on chronically from his PCP as well as trazodone to sleep at night.  He is sleeping okay and does not really wake with pain.  Has difficulty performing ADLs due to the arm weakness now..                ROS: All systems reviewed are negative as they relate to the chief complaint within the history of present illness.  Patient denies fevers or chills.  Assessment & Plan: Visit Diagnoses:  1. Complete tear of right rotator cuff, unspecified whether traumatic     Plan: Patient is a 57 year old male who presents for evaluation of shoulder pain over the last week.  He has history of large rotator cuff tear that was repaired in November of last year.  He had moderate infraspinatus atrophy at that time.  He now has fairly significant shoulder  weakness with negative radiographs.  Really cannot actively lift his arm up to any significant degree.  Positive external rotation lag sign and Hornblower sign on exam today.  Plan is to evaluate likely recurrent large rotator cuff tear with MRI arthrogram of the right shoulder and then return after MRI to discuss further options with Dr. Marlou Sa.    Follow-Up Instructions: No follow-ups on file.   Orders:  Orders Placed This Encounter  Procedures   XR Shoulder Right   XR Shoulder Right   Arthrogram   No orders of the defined types were placed in this encounter.     Procedures: No procedures performed   Clinical Data: No additional findings.  Objective: Vital Signs: There were no vitals taken for this visit.  Physical Exam:  Constitutional: Patient appears well-developed HEENT:  Head: Normocephalic Eyes:EOM are normal Neck: Normal range of motion Cardiovascular: Normal rate Pulmonary/chest: Effort normal Neurologic: Patient is alert Skin: Skin is warm Psychiatric: Patient has normal mood and affect  Ortho Exam: Ortho exam demonstrates right shoulder with 45 degrees X rotation, 90 degrees abduction, 150 degrees forward flexion passively.  He has about 30 degrees of active abduction and forward flexion.  He has positive external rotation lag sign and really is not able to maintain any degree of external rotation.  Positive Hornblower sign.  Incision is well-healed from prior surgery.  He has excellent EPL, FPL, finger abduction, grip strength, bicep flexion, tricep extension strength.  Excellent nerve is intact with deltoid firing.  No tenderness over the [facility] joint.  Minimal tenderness over the bicipital groove.  Specialty Comments:  EXAM: MRI LUMBAR SPINE WITHOUT CONTRAST   TECHNIQUE: Multiplanar, multisequence MR imaging of the lumbar spine was performed. No intravenous contrast was administered.   COMPARISON:  MRI of the lumbar spine December 05 2018.    FINDINGS: Segmentation:  Standard.   Alignment:  Trace retrolisthesis at L2-3 and L3-4.   Vertebrae:  No fracture, evidence of discitis, or bone lesion.   Conus medullaris and cauda equina: Conus extends to the L1-2 level. Conus and cauda equina appear normal.   Paraspinal and other soft tissues: Negative.   Disc levels:   T12-L1: No spinal canal or neural foraminal stenosis.   L1-2: No spinal canal or neural foraminal stenosis.   L2-3: Shallow disc bulge and mild bilateral facet joint effusion. No significant spinal canal or neural foraminal stenosis.   L3-4: Left subarticular to far lateral disc protrusion, moderate hypertrophic facet degenerative changes with trace bilateral joint effusion resulting in mild narrowing of the left subarticular zone and mild left neural foraminal narrowing, unchanged.   L4-5: Disc bulge, moderate hypertrophic facet degenerative changes and mild ligamentum flavum redundancy resulting in mild-to-moderate right and mild left neural foraminal narrowing. No significant spinal canal stenosis. Findings have progressed since prior MRI.   L5-S1: Shallow disc bulge and mild facet degenerative changes without significant spinal canal or neural foraminal stenosis, unchanged.   IMPRESSION: 1. Progression of degenerative changes at L4-5 now with mild-to-moderate right and mild left neural. 2. Stable left subarticular to far lateral disc protrusion at L3-4 resulting in mild narrowing of the left subarticular zone and mild left neural foraminal narrowing.     Electronically Signed   By: Pedro Earls M.D.   On: 09/29/2021 17:20  Imaging: No results found.   PMFS History: Patient Active Problem List   Diagnosis Date Noted   Pernicious anemia 10/12/2021   Chronic pain associated with significant psychosocial dysfunction 03/05/2021   History of deep venous thrombosis (DVT) of distal vein of left lower extremity 03/05/2021    Arthritis of left knee    S/P TKR (total knee replacement), left 02/10/2021   Grief counseling 11/14/2020   Abnormal electrocardiogram (ECG) (EKG) 09/02/2020   Preoperative evaluation to rule out surgical contraindication 09/02/2020   MDD (major depressive disorder), recurrent episode, moderate ([place]) 05/29/2020   Colon cancer screening 02/02/2020   Eczema of both external ears 12/08/2019   1+ pitting edema 12/08/2019   Polyarthritis of multiple sites 10/27/2019   CAD (coronary artery disease) 10/27/2019   Radiculopathy, cervical region 06/10/2019   Carpal tunnel syndrome, left upper limb 06/10/2019   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   Spinal stenosis of cervical region 12/31/2018   Loose body of left knee    Plantar fasciitis of left foot 08/24/2017   Chronic pain of both hips 06/20/2017   Nontraumatic complete tear of right rotator cuff 09/14/2015   Hypothyroidism due to acquired atrophy of thyroid 09/14/2015   Arthritis of knee 06/23/2014   Encounter for preventive health examination 06/17/2014   Enlarged RV (right ventricle) 06/17/2014   GERD (gastroesophageal reflux disease) 04/20/2014   Cervical spine degeneration 02/17/2014   Overweight 10/29/2013   Chronic knee pain 10/29/2013   Type 2 diabetes mellitus with diabetic neuropathy, unspecified ([place]) 10/23/2012  Seborrheic dermatitis, unspecified 05/17/2012   Fatty liver 04/22/2012   Hypogonadism male 03/12/2012   Chronic pain of multiple joints 12/24/2010   Sciatica of left side associated with disorder of lumbar spine 12/24/2010   Hyperlipidemia associated with type 2 diabetes mellitus ([place]) 01/13/2009   Anxiety state 01/13/2009   Essential hypertension 01/13/2009   OSA (obstructive sleep apnea) 01/13/2009   Past Medical History:  Diagnosis Date   Acute medial meniscus tear of left knee    Alcohol-induced chronic pancreatitis ([place]) 11/20/2016   Anxiety    Anxiety and depression    Arthritis    knees and shoulders    Benzodiazepine withdrawal with complication ([place]) 123XX123   Bruit    L   Chest pain    hx   Cognitive complaints with normal neuropsychological exam 10/23/2016   Diabetes mellitus without complication (HCC)    "borderline", diet controlled, no meds, patient has lost 30 lbs   Edema    Fatty liver    GERD (gastroesophageal reflux disease)    uses Omeprazole   Goiter    HLD (hyperlipidemia)    Hypertension    essential, benign   Hypothyroidism    Impotence of organic origin    Murmur    never has caused any problems   Neuromuscular disorder (HCC)    neuropathy bilateral feet   Other chest pain    tightness, pressure   Palpitation    hx   Precordial pain    Sleep apnea    uses cpap    Family History  Problem Relation Age of Onset   Heart disease Father    Colon cancer Neg Hx    Colon polyps Neg Hx    Esophageal cancer Neg Hx    Rectal cancer Neg Hx    Stomach cancer Neg Hx     Past Surgical History:  Procedure Laterality Date   APPENDECTOMY  2002   done at [place] N/A 08/27/2015   Procedure: Left Heart Cath and Coronary Angiography;  Surgeon: Jolaine Artist, MD;  Location: [facility];  Service: Cardiovascular;  Laterality: N/A;   COLONOSCOPY  in his 20's   COLONOSCOPY  2019   EAR CYST EXCISION Left 07/08/2019   Procedure: open excision baker's cyst left knee;  Surgeon: Meredith Pel, MD;  Location: [facility];  Service: Orthopedics;  Laterality: Left;   HERNIA REPAIR  [id number]   umbilical   JOINT REPLACEMENT     right knee   KNEE ARTHROSCOPY Left 12/17/2018   Procedure: left knee arthroscopy, meniscal debridement, loose body removal;  Surgeon: Meredith Pel, MD;  Location: [facility];  Service: Orthopedics;  Laterality: Left;   REVISION TOTAL KNEE ARTHROPLASTY Right 06/23/2014   DR Marlou Sa   SHOULDER ARTHROSCOPY WITH ROTATOR CUFF REPAIR AND SUBACROMIAL DECOMPRESSION Right 03/07/2022    Procedure: RIGHT SHOULDER ARTHROSCOPY, DEBRIDEMENT, MINI OPEN ROTATOR CUFF TEAR REPAIR;  Surgeon: Meredith Pel, MD;  Location: [facility];  Service: Orthopedics;  Laterality: Right;   TONSILLECTOMY     TOTAL KNEE ARTHROPLASTY Right 2011   right   TOTAL KNEE ARTHROPLASTY Left 02/10/2021   Procedure: LEFT TOTAL KNEE ARTHROPLASTY;  Surgeon: Meredith Pel, MD;  Location: [facility];  Service: Orthopedics;  Laterality: Left;   TOTAL KNEE REVISION Right 06/23/2014   Procedure: TOTAL KNEE REVISION;  Surgeon: Meredith Pel, MD;  Location: [facility];  Service: Orthopedics;  Laterality: Right;   UPPER  GASTROINTESTINAL ENDOSCOPY  04/30/2013   Judieth Keens TOOTH EXTRACTION     Social History   Occupational History   Occupation: Personnel officer: Public relations account executive   Occupation: SERVICE DIRECTOR    Employer: [employer]  Tobacco Use   Smoking status: Former    Packs/day: 2.00    Years: 18.00    Total pack years: 36.00    Types: Cigarettes    Quit date: 04/24/2005    Years since quitting: 17.1   Smokeless tobacco: Never   Tobacco comments:    quit 20 years ago   Vaping Use   Vaping Use: Never used  Substance and Sexual Activity   Alcohol use: Yes    Alcohol/week: 4.0 standard drinks of alcohol    Types: 4 Cans of beer per week    Comment: Occasionally   Drug use: No   Sexual activity: Yes

## 2022-06-05 ENCOUNTER — Other Ambulatory Visit: Payer: Self-pay

## 2022-06-05 DIAGNOSIS — M75121 Complete rotator cuff tear or rupture of right shoulder, not specified as traumatic: Secondary | ICD-10-CM

## 2022-06-05 NOTE — Telephone Encounter (Signed)
Rec'd patients pages back in mail.  Faxed provider portion to office.

## 2022-06-08 ENCOUNTER — Ambulatory Visit: Payer: Medicare HMO | Admitting: Orthopedic Surgery

## 2022-06-08 ENCOUNTER — Encounter: Payer: Self-pay | Admitting: Orthopedic Surgery

## 2022-06-08 DIAGNOSIS — M75121 Complete rotator cuff tear or rupture of right shoulder, not specified as traumatic: Secondary | ICD-10-CM

## 2022-06-08 NOTE — Progress Notes (Signed)
Office Visit Note   Patient: [name]           Date of Birth: 08/17/1965           MRN: [id number] Visit Date: 06/08/2022 Requested by: Einar [address], [id number] [place],  [place] [address] PCP: [name], MD  Subjective: Chief Complaint  Patient presents with   Right Shoulder - Follow-up    HPI: [name] is a 57 y.o. male who presents to the office reporting right shoulder pain.  Had massive rotator cuff tear repair 03/07/2022.  Was doing well after surgery.  Then he developed significant weakness at his last clinic visit along with significant pain.  Denies any fevers and chills.  He states he has not really done anything different.  Does take Norco 01/25/2024 2/day on pain contract.  Was on this prior to surgery as well..                ROS: All systems reviewed are negative as they relate to the chief complaint within the history of present illness.  Patient denies fevers or chills.  Assessment & Plan: Visit Diagnoses:  1. Complete tear of right rotator cuff, unspecified whether traumatic     Plan: Impression is MRI pending right shoulder.  Likely has recurrent rotator cuff tear.  Did have some infraspinatus atrophy at the time of surgery.  He does have fairly profound weakness with diminished abduction and forward flexion both around 50 degrees.  Has an external rotation lag sign as well.  Decision for [name] will be lower trapezius tendon transfer versus reverse shoulder replacement.  We will see him back after his imaging study.  Follow-Up Instructions: No follow-ups on file.   Orders:  No orders of the defined types were placed in this encounter.  No orders of the defined types were placed in this encounter.     Procedures: No procedures performed   Clinical Data: No additional findings.  Objective: Vital Signs: There were no vitals taken for this visit.  Physical Exam:  Constitutional: Patient appears  well-developed HEENT:  Head: Normocephalic Eyes:EOM are normal Neck: Normal range of motion Cardiovascular: Normal rate Pulmonary/chest: Effort normal Neurologic: Patient is alert Skin: Skin is warm Psychiatric: Patient has normal mood and affect  Ortho Exam: Ortho exam demonstrates active forward flexion and abduction both to about 50 degrees.  Has pretty significant weakness to external rotation testing on the right at 3- out of 5.  Subscap strength 5+ out of 5 on the right.  Does have some mild coarseness but not as much as I would expect with a significant rotator cuff tear recurrent's.  Incision intact.  Specialty Comments:  EXAM: MRI LUMBAR SPINE WITHOUT CONTRAST   TECHNIQUE: Multiplanar, multisequence MR imaging of the lumbar spine was performed. No intravenous contrast was administered.   COMPARISON:  MRI of the lumbar spine December 05 2018.   FINDINGS: Segmentation:  Standard.   Alignment:  Trace retrolisthesis at L2-3 and L3-4.   Vertebrae:  No fracture, evidence of discitis, or bone lesion.   Conus medullaris and cauda equina: Conus extends to the L1-2 level. Conus and cauda equina appear normal.   Paraspinal and other soft tissues: Negative.   Disc levels:   T12-L1: No spinal canal or neural foraminal stenosis.   L1-2: No spinal canal or neural foraminal stenosis.   L2-3: Shallow disc bulge and mild bilateral facet joint effusion. No significant spinal canal or neural foraminal  stenosis.   L3-4: Left subarticular to far lateral disc protrusion, moderate hypertrophic facet degenerative changes with trace bilateral joint effusion resulting in mild narrowing of the left subarticular zone and mild left neural foraminal narrowing, unchanged.   L4-5: Disc bulge, moderate hypertrophic facet degenerative changes and mild ligamentum flavum redundancy resulting in mild-to-moderate right and mild left neural foraminal narrowing. No significant spinal canal  stenosis. Findings have progressed since prior MRI.   L5-S1: Shallow disc bulge and mild facet degenerative changes without significant spinal canal or neural foraminal stenosis, unchanged.   IMPRESSION: 1. Progression of degenerative changes at L4-5 now with mild-to-moderate right and mild left neural. 2. Stable left subarticular to far lateral disc protrusion at L3-4 resulting in mild narrowing of the left subarticular zone and mild left neural foraminal narrowing.     Electronically Signed   By: Pedro Earls M.D.   On: 09/29/2021 17:20  Imaging: No results found.   PMFS History: Patient Active Problem List   Diagnosis Date Noted   Pernicious anemia 10/12/2021   Chronic pain associated with significant psychosocial dysfunction 03/05/2021   History of deep venous thrombosis (DVT) of distal vein of left lower extremity 03/05/2021   Arthritis of left knee    S/P TKR (total knee replacement), left 02/10/2021   Grief counseling 11/14/2020   Abnormal electrocardiogram (ECG) (EKG) 09/02/2020   Preoperative evaluation to rule out surgical contraindication 09/02/2020   MDD (major depressive disorder), recurrent episode, moderate ([place]) 05/29/2020   Colon cancer screening 02/02/2020   Eczema of both external ears 12/08/2019   1+ pitting edema 12/08/2019   Polyarthritis of multiple sites 10/27/2019   CAD (coronary artery disease) 10/27/2019   Radiculopathy, cervical region 06/10/2019   Carpal tunnel syndrome, left upper limb 06/10/2019   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   Spinal stenosis of cervical region 12/31/2018   Loose body of left knee    Plantar fasciitis of left foot 08/24/2017   Chronic pain of both hips 06/20/2017   Nontraumatic complete tear of right rotator cuff 09/14/2015   Hypothyroidism due to acquired atrophy of thyroid 09/14/2015   Arthritis of knee 06/23/2014   Encounter for preventive health examination 06/17/2014   Enlarged RV (right  ventricle) 06/17/2014   GERD (gastroesophageal reflux disease) 04/20/2014   Cervical spine degeneration 02/17/2014   Overweight 10/29/2013   Chronic knee pain 10/29/2013   Type 2 diabetes mellitus with diabetic neuropathy, unspecified ([place]) 10/23/2012   Seborrheic dermatitis, unspecified 05/17/2012   Fatty liver 04/22/2012   Hypogonadism male 03/12/2012   Chronic pain of multiple joints 12/24/2010   Sciatica of left side associated with disorder of lumbar spine 12/24/2010   Hyperlipidemia associated with type 2 diabetes mellitus ([place]) 01/13/2009   Anxiety state 01/13/2009   Essential hypertension 01/13/2009   OSA (obstructive sleep apnea) 01/13/2009   Past Medical History:  Diagnosis Date   Acute medial meniscus tear of left knee    Alcohol-induced chronic pancreatitis ([place]) 11/20/2016   Anxiety    Anxiety and depression    Arthritis    knees and shoulders   Benzodiazepine withdrawal with complication ([place]) 123XX123   Bruit    L   Chest pain    hx   Cognitive complaints with normal neuropsychological exam 10/23/2016   Diabetes mellitus without complication ([place])    "borderline", diet controlled, no meds, patient has lost 30 lbs   Edema    Fatty liver    GERD (gastroesophageal reflux disease)    uses Omeprazole  Goiter    HLD (hyperlipidemia)    Hypertension    essential, benign   Hypothyroidism    Impotence of organic origin    Murmur    never has caused any problems   Neuromuscular disorder (HCC)    neuropathy bilateral feet   Other chest pain    tightness, pressure   Palpitation    hx   Precordial pain    Sleep apnea    uses cpap    Family History  Problem Relation Age of Onset   Heart disease Father    Colon cancer Neg Hx    Colon polyps Neg Hx    Esophageal cancer Neg Hx    Rectal cancer Neg Hx    Stomach cancer Neg Hx     Past Surgical History:  Procedure Laterality Date   APPENDECTOMY  2002   done at [facility] N/A 08/27/2015   Procedure: Left Heart Cath and Coronary Angiography;  Surgeon: Jolaine Artist, MD;  Location: [facility];  Service: Cardiovascular;  Laterality: N/A;   COLONOSCOPY  in his 20's   COLONOSCOPY  2019   EAR CYST EXCISION Left 07/08/2019   Procedure: open excision baker's cyst left knee;  Surgeon: Meredith Pel, MD;  Location: [facility];  Service: Orthopedics;  Laterality: Left;   HERNIA REPAIR  [id number]   umbilical   JOINT REPLACEMENT     right knee   KNEE ARTHROSCOPY Left 12/17/2018   Procedure: left knee arthroscopy, meniscal debridement, loose body removal;  Surgeon: Meredith Pel, MD;  Location: [facility];  Service: Orthopedics;  Laterality: Left;   REVISION TOTAL KNEE ARTHROPLASTY Right 06/23/2014   DR Marlou Sa   SHOULDER ARTHROSCOPY WITH ROTATOR CUFF REPAIR AND SUBACROMIAL DECOMPRESSION Right 03/07/2022   Procedure: RIGHT SHOULDER ARTHROSCOPY, DEBRIDEMENT, MINI OPEN ROTATOR CUFF TEAR REPAIR;  Surgeon: Meredith Pel, MD;  Location: [facility];  Service: Orthopedics;  Laterality: Right;   TONSILLECTOMY     TOTAL KNEE ARTHROPLASTY Right 2011   right   TOTAL KNEE ARTHROPLASTY Left 02/10/2021   Procedure: LEFT TOTAL KNEE ARTHROPLASTY;  Surgeon: Meredith Pel, MD;  Location: [facility];  Service: Orthopedics;  Laterality: Left;   TOTAL KNEE REVISION Right 06/23/2014   Procedure: TOTAL KNEE REVISION;  Surgeon: Meredith Pel, MD;  Location: [facility];  Service: Orthopedics;  Laterality: Right;   UPPER GASTROINTESTINAL ENDOSCOPY  04/30/2013   Judieth Keens TOOTH EXTRACTION     Social History   Occupational History   Occupation: Personnel officer: Public relations account executive   Occupation: SERVICE DIRECTOR    Employer: [employer]  Tobacco Use   Smoking status: Former    Packs/day: 2.00    Years: 18.00    Total pack years: 36.00    Types: Cigarettes    Quit date: 04/24/2005    Years since quitting:  17.1   Smokeless tobacco: Never   Tobacco comments:    quit 20 years ago   Vaping Use   Vaping Use: Never used  Substance and Sexual Activity   Alcohol use: Yes    Alcohol/week: 4.0 standard drinks of alcohol    Types: 4 Cans of beer per week    Comment: Occasionally   Drug use: No   Sexual activity: Yes

## 2022-06-09 ENCOUNTER — Other Ambulatory Visit: Payer: Self-pay | Admitting: Internal Medicine

## 2022-06-10 ENCOUNTER — Other Ambulatory Visit: Payer: Self-pay | Admitting: Internal Medicine

## 2022-06-12 ENCOUNTER — Ambulatory Visit (INDEPENDENT_AMBULATORY_CARE_PROVIDER_SITE_OTHER): Payer: Medicare HMO

## 2022-06-12 DIAGNOSIS — E538 Deficiency of other specified B group vitamins: Secondary | ICD-10-CM | POA: Diagnosis not present

## 2022-06-12 MED ORDER — CYANOCOBALAMIN 1000 MCG/ML IJ SOLN
1000.0000 ug | Freq: Once | INTRAMUSCULAR | Status: AC
  Administered 2022-06-12: 1000 ug via INTRAMUSCULAR

## 2022-06-12 NOTE — Progress Notes (Signed)
Pt presented for their vitamin B12 injection. Pt was identified through two identifiers. Pt tolerated shot well in their left  deltoid.  

## 2022-06-13 NOTE — Telephone Encounter (Signed)
Submitted application for [employer] to [employer]) for patient assistance.   Phone: [phone]

## 2022-06-19 ENCOUNTER — Telehealth: Payer: Self-pay | Admitting: Internal Medicine

## 2022-06-19 NOTE — Telephone Encounter (Signed)
Pt would like someone to call him in regards to the jardiance medication he received in the mail

## 2022-06-20 NOTE — Telephone Encounter (Signed)
Spoke with pt and he stated that he has received the jardiance in the mail but isn't sure if he is supposed to take that with the Metformin that he is already taking.  Pt also stated that he is getting ready to have another shoulder surgery. He gets prescribed Hydrocodone by Dr. Derrel Nip but that will not take care of the pain after surgery and his surgeon will prescribe him something stronger. Pt is aware that he signed a contract with you and wants to make sure that he does not void his pain contract.

## 2022-06-20 NOTE — Telephone Encounter (Signed)
LMTCB

## 2022-06-20 NOTE — Telephone Encounter (Signed)
Pt called and I read the message to him and he stated that's what he wanted to know

## 2022-06-20 NOTE — Telephone Encounter (Signed)
noted 

## 2022-06-21 ENCOUNTER — Ambulatory Visit
Admission: RE | Admit: 2022-06-21 | Discharge: 2022-06-21 | Disposition: A | Discharge status: Home / Self Care | Payer: Medicare HMO | Source: Ambulatory Visit | Attending: Surgical | Admitting: Surgical

## 2022-06-21 DIAGNOSIS — M75121 Complete rotator cuff tear or rupture of right shoulder, not specified as traumatic: Secondary | ICD-10-CM

## 2022-06-21 DIAGNOSIS — M75101 Unspecified rotator cuff tear or rupture of right shoulder, not specified as traumatic: Secondary | ICD-10-CM | POA: Diagnosis not present

## 2022-06-21 DIAGNOSIS — S46011A Strain of muscle(s) and tendon(s) of the rotator cuff of right shoulder, initial encounter: Secondary | ICD-10-CM | POA: Diagnosis not present

## 2022-06-21 MED ORDER — IOPAMIDOL (ISOVUE-M 200) INJECTION 41%
15.0000 mL | Freq: Once | INTRAMUSCULAR | Status: AC
  Administered 2022-06-21: 15 mL via INTRA_ARTICULAR

## 2022-06-23 ENCOUNTER — Ambulatory Visit: Payer: Medicare HMO | Admitting: Orthopedic Surgery

## 2022-06-23 DIAGNOSIS — M75121 Complete rotator cuff tear or rupture of right shoulder, not specified as traumatic: Secondary | ICD-10-CM

## 2022-06-24 ENCOUNTER — Encounter: Payer: Self-pay | Admitting: Orthopedic Surgery

## 2022-06-24 NOTE — Progress Notes (Signed)
Office Visit Note   Patient: [name]           Date of Birth: July 29, 1965           MRN: [id number] Visit Date: 06/23/2022 Requested by: [name], MD [address] [place],  [place] [address] PCP: [name], MD  Subjective: Chief Complaint  Patient presents with   Other     Scan review    HPI: [name] is a 57 y.o. male who presents to the office reporting right shoulder pain.  Patient had massive rotator cuff tear repair done in November.  Did not report any definite injury but reported relatively sudden onset of pain and weakness in that right shoulder.  He states currently the pain is equivalent to the weakness in terms of his primary complaints.  He states he can hardly eat.  He denies any fevers or chills.  He does live alone.  Is a low demand individual in terms of nothing outside of ADLs that he requires of his shoulder.  MRI scan does show read tearing of the rotator cuff tendon with retraction.  Involves the supraspinatus and infraspinatus.  Teres minor and subscap tendons unremarkable..                ROS: All systems reviewed are negative as they relate to the chief complaint within the history of present illness.  Patient denies fevers or chills.  Assessment & Plan: Visit Diagnoses:  1. Complete tear of right rotator cuff, unspecified whether traumatic     Plan: Impression is profound external rotation weakness following massive rotator cuff tear repair.  The tendons are retracted and do not necessarily look repairable.  Options at this time are discussed with [name] including observation, tendon transfer which would be predictable for strength improvement but not as predictable for pain relief.  Reverse replacement would be the final option which would be more predictable for pain relief and not as helpful for the strength component particularly with external rotation.  Latissimus transfer to be considered in this case but his teres does  remain intact.  How long discussion with [name] about the various rehab options.  Does not look like infection is at play here after long discussion [name] wants to proceed with reverse replacement.  Implant longevity is discussed but because of his demand level I think it is likely that this could last a longer time.  He has had both knees replaced and 1 revised as well.  Plan at this time is thin cut CT scan for patient specific instrumentation with subsequent reverse shoulder replacement.  The risk and benefits are discussed including not limited to infection or vessel damage incomplete pain relief and incomplete restoration of function.  Patient understands risk and benefits and wishes to proceed.  All questions answered  Follow-Up Instructions: No follow-ups on file.   Orders:  Orders Placed This Encounter  Procedures   CT SHOULDER RIGHT WO CONTRAST   No orders of the defined types were placed in this encounter.     Procedures: No procedures performed   Clinical Data: No additional findings.  Objective: Vital Signs: There were no vitals taken for this visit.  Physical Exam:  Constitutional: Patient appears well-developed HEENT:  Head: Normocephalic Eyes:EOM are normal Neck: Normal range of motion Cardiovascular: Normal rate Pulmonary/chest: Effort normal Neurologic: Patient is alert Skin: Skin is warm Psychiatric: Patient has normal mood and affect  Ortho Exam: Ortho exam demonstrates forward  flexion and abduction both below 45 degrees.  Deltoid is functional.  Incision is intact with no redness or erythema.  No axillary lymphadenopathy is present.  Passive range of motion is maintained above 90 degrees of forward flexion and abduction.  He does have positive external rotation lag testing.  Pretty profound weakness to infraspinatus testing at 2 out of 5 but subscap strength is 5+ out of 5.  Specialty Comments:  EXAM: MRI LUMBAR SPINE WITHOUT CONTRAST    TECHNIQUE: Multiplanar, multisequence MR imaging of the lumbar spine was performed. No intravenous contrast was administered.   COMPARISON:  MRI of the lumbar spine December 05 2018.   FINDINGS: Segmentation:  Standard.   Alignment:  Trace retrolisthesis at L2-3 and L3-4.   Vertebrae:  No fracture, evidence of discitis, or bone lesion.   Conus medullaris and cauda equina: Conus extends to the L1-2 level. Conus and cauda equina appear normal.   Paraspinal and other soft tissues: Negative.   Disc levels:   T12-L1: No spinal canal or neural foraminal stenosis.   L1-2: No spinal canal or neural foraminal stenosis.   L2-3: Shallow disc bulge and mild bilateral facet joint effusion. No significant spinal canal or neural foraminal stenosis.   L3-4: Left subarticular to far lateral disc protrusion, moderate hypertrophic facet degenerative changes with trace bilateral joint effusion resulting in mild narrowing of the left subarticular zone and mild left neural foraminal narrowing, unchanged.   L4-5: Disc bulge, moderate hypertrophic facet degenerative changes and mild ligamentum flavum redundancy resulting in mild-to-moderate right and mild left neural foraminal narrowing. No significant spinal canal stenosis. Findings have progressed since prior MRI.   L5-S1: Shallow disc bulge and mild facet degenerative changes without significant spinal canal or neural foraminal stenosis, unchanged.   IMPRESSION: 1. Progression of degenerative changes at L4-5 now with mild-to-moderate right and mild left neural. 2. Stable left subarticular to far lateral disc protrusion at L3-4 resulting in mild narrowing of the left subarticular zone and mild left neural foraminal narrowing.     Electronically Signed   By: Baldemar Lenis M.D.   On: 09/29/2021 17:20  Imaging: No results found.   PMFS History: Patient Active Problem List   Diagnosis Date Noted   Pernicious anemia  10/12/2021   Chronic pain associated with significant psychosocial dysfunction 03/05/2021   History of deep venous thrombosis (DVT) of distal vein of left lower extremity 03/05/2021   Arthritis of left knee    S/P TKR (total knee replacement), left 02/10/2021   Grief counseling 11/14/2020   Abnormal electrocardiogram (ECG) (EKG) 09/02/2020   Preoperative evaluation to rule out surgical contraindication 09/02/2020   MDD (major depressive disorder), recurrent episode, moderate (HCC) 05/29/2020   Colon cancer screening 02/02/2020   Eczema of both external ears 12/08/2019   1+ pitting edema 12/08/2019   Polyarthritis of multiple sites 10/27/2019   CAD (coronary artery disease) 10/27/2019   Radiculopathy, cervical region 06/10/2019   Carpal tunnel syndrome, left upper limb 06/10/2019   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   Spinal stenosis of cervical region 12/31/2018   Loose body of left knee    Plantar fasciitis of left foot 08/24/2017   Chronic pain of both hips 06/20/2017   Nontraumatic complete tear of right rotator cuff 09/14/2015   Hypothyroidism due to acquired atrophy of thyroid 09/14/2015   Arthritis of knee 06/23/2014   Encounter for preventive health examination 06/17/2014   Enlarged RV (right ventricle) 06/17/2014   GERD (gastroesophageal reflux disease) 04/20/2014  Cervical spine degeneration 02/17/2014   Overweight 10/29/2013   Chronic knee pain 10/29/2013   Type 2 diabetes mellitus with diabetic neuropathy, unspecified (HCC) 10/23/2012   Seborrheic dermatitis, unspecified 05/17/2012   Fatty liver 04/22/2012   Hypogonadism male 03/12/2012   Chronic pain of multiple joints 12/24/2010   Sciatica of left side associated with disorder of lumbar spine 12/24/2010   Hyperlipidemia associated with type 2 diabetes mellitus (HCC) 01/13/2009   Anxiety state 01/13/2009   Essential hypertension 01/13/2009   OSA (obstructive sleep apnea) 01/13/2009   Past Medical History:   Diagnosis Date   Acute medial meniscus tear of left knee    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   Anxiety    Anxiety and depression    Arthritis    knees and shoulders   Benzodiazepine withdrawal with complication (HCC) 01/09/2019   Bruit    L   Chest pain    hx   Cognitive complaints with normal neuropsychological exam 10/23/2016   Diabetes mellitus without complication (HCC)    "borderline", diet controlled, no meds, patient has lost 30 lbs   Edema    Fatty liver    GERD (gastroesophageal reflux disease)    uses Omeprazole   Goiter    HLD (hyperlipidemia)    Hypertension    essential, benign   Hypothyroidism    Impotence of organic origin    Murmur    never has caused any problems   Neuromuscular disorder (HCC)    neuropathy bilateral feet   Other chest pain    tightness, pressure   Palpitation    hx   Precordial pain    Sleep apnea    uses cpap    Family History  Problem Relation Age of Onset   Heart disease Father    Colon cancer Neg Hx    Colon polyps Neg Hx    Esophageal cancer Neg Hx    Rectal cancer Neg Hx    Stomach cancer Neg Hx     Past Surgical History:  Procedure Laterality Date   APPENDECTOMY  2002   [facility] at [facility]   CARDIAC CATHETERIZATION     CARDIAC CATHETERIZATION N/A 08/27/2015   Procedure: Left Heart Cath and Coronary Angiography;  Surgeon: Dolores Patty, MD;  Location: [facility] INVASIVE CV LAB;  Service: Cardiovascular;  Laterality: N/A;   COLONOSCOPY  in his 20's   COLONOSCOPY  2019   EAR CYST EXCISION Left 07/08/2019   Procedure: open excision baker's cyst left knee;  Surgeon: Cammy Copa, MD;  Location: [facility] OR;  Service: Orthopedics;  Laterality: Left;   HERNIA REPAIR  05/15/2013   umbilical   JOINT REPLACEMENT     right knee   KNEE ARTHROSCOPY Left 12/17/2018   Procedure: left knee arthroscopy, meniscal debridement, loose body removal;  Surgeon: Cammy Copa, MD;  Location: [facility] OR;  Service:  Orthopedics;  Laterality: Left;   REVISION TOTAL KNEE ARTHROPLASTY Right 06/23/2014   DR [name]   SHOULDER ARTHROSCOPY WITH ROTATOR CUFF REPAIR AND SUBACROMIAL DECOMPRESSION Right 03/07/2022   Procedure: RIGHT SHOULDER ARTHROSCOPY, DEBRIDEMENT, MINI OPEN ROTATOR CUFF TEAR REPAIR;  Surgeon: Cammy Copa, MD;  Location: [facility] OR;  Service: Orthopedics;  Laterality: Right;   TONSILLECTOMY     TOTAL KNEE ARTHROPLASTY Right 2011   right   TOTAL KNEE ARTHROPLASTY Left 02/10/2021   Procedure: LEFT TOTAL KNEE ARTHROPLASTY;  Surgeon: Cammy Copa, MD;  Location: [facility] OR;  Service: Orthopedics;  Laterality: Left;   TOTAL KNEE  REVISION Right 06/23/2014   Procedure: TOTAL KNEE REVISION;  Surgeon: Cammy Copa, MD;  Location: [facility] OR;  Service: Orthopedics;  Laterality: Right;   UPPER GASTROINTESTINAL ENDOSCOPY  04/30/2013   Demetrius Charity TOOTH EXTRACTION     Social History   Occupational History   Occupation: Acupuncturist: Advertising copywriter   Occupation: SERVICE DIRECTOR    Employer: [employer]  Tobacco Use   Smoking status: Former    Packs/day: 2.00    Years: 18.00    Total pack years: 36.00    Types: Cigarettes    Quit date: 04/24/2005    Years since quitting: 17.1   Smokeless tobacco: Never   Tobacco comments:    quit 20 years ago   Vaping Use   Vaping Use: Never used  Substance and Sexual Activity   Alcohol use: Yes    Alcohol/week: 4.0 standard drinks of alcohol    Types: 4 Cans of beer per week    Comment: Occasionally   Drug use: No   Sexual activity: Yes

## 2022-06-27 ENCOUNTER — Ambulatory Visit
Admission: RE | Admit: 2022-06-27 | Discharge: 2022-06-27 | Disposition: A | Discharge status: Home / Self Care | Payer: Medicare HMO | Source: Ambulatory Visit | Attending: Orthopedic Surgery | Admitting: Orthopedic Surgery

## 2022-06-27 DIAGNOSIS — M25511 Pain in right shoulder: Secondary | ICD-10-CM | POA: Diagnosis not present

## 2022-06-27 DIAGNOSIS — M75121 Complete rotator cuff tear or rupture of right shoulder, not specified as traumatic: Secondary | ICD-10-CM

## 2022-07-06 NOTE — Progress Notes (Signed)
Hi [name] you have surgery sheet for [name]?

## 2022-07-07 ENCOUNTER — Telehealth (HOSPITAL_BASED_OUTPATIENT_CLINIC_OR_DEPARTMENT_OTHER): Payer: Medicare HMO | Admitting: Psychiatry

## 2022-07-07 ENCOUNTER — Telehealth (HOSPITAL_COMMUNITY): Payer: Medicare HMO | Admitting: Psychiatry

## 2022-07-07 ENCOUNTER — Encounter (HOSPITAL_COMMUNITY): Payer: Self-pay | Admitting: Psychiatry

## 2022-07-07 VITALS — Wt 221.0 lb

## 2022-07-07 DIAGNOSIS — F4321 Adjustment disorder with depressed mood: Secondary | ICD-10-CM

## 2022-07-07 DIAGNOSIS — F331 Major depressive disorder, recurrent, moderate: Secondary | ICD-10-CM | POA: Diagnosis not present

## 2022-07-07 DIAGNOSIS — F419 Anxiety disorder, unspecified: Secondary | ICD-10-CM | POA: Diagnosis not present

## 2022-07-07 MED ORDER — BUPROPION HCL ER (XL) 300 MG PO TB24: 300.0000 mg | ORAL_TABLET | Freq: Every day | ORAL | 0 refills | Status: DC

## 2022-07-07 MED ORDER — ARIPIPRAZOLE 5 MG PO TABS: ORAL_TABLET | ORAL | 0 refills | Status: DC

## 2022-07-07 NOTE — Progress Notes (Signed)
[facility] MD Virtual Progress Note   Patient Location: Home Provider Location: Home Office  I connect with patient by video and verified that I am speaking with correct person by using two identifiers. I discussed the limitations of evaluation and management by telemedicine and the availability of in person appointments. I also discussed with the patient that there may be a patient responsible charge related to this service. The patient expressed understanding and agreed to proceed.  [name] [name] y.o.  07/07/2022 10:39 AM  History of Present Illness:  Patient is evaluated by video session.  Patient told he had a setback after he found his 110 year old daughter dead on the bathtub with excessive bleeding around her.  Patient not sure what happened to her as autopsy is in the process.  Patient told daughter had cardiomyopathy and heart related issues.  He is not sure but suspect aneurysm.  Patient told his son called him after her daughter's employer called patient's son that patient's daughter did not show up for work.  Patient told he cannot forget the image after he saw her daughter with the pool of blood on better.  Patient not sure how long ago she died but he found on Aug 11, 2022 but her last text message was Saturday.  He also very sad because 68-1/2-year-old and 3/73-year-old grandkids were probably by themself around that time.  He admitted crying spells, poor sleep, dysphoria, and decreased appetite.  He may have lost 1 to 2 pounds.  He tried to keep himself busy and seeing counseling through the church at least 2 times a week.  He also goes to his friends shop and try to help him.  On the weekends he tried to keep himself busy by cleaning his own place.  He does see the grandkids since they are now with their dad.  He is pleased that he let him see the grandkids.  Patient's 93 year old son also live close by who he is in touch at least 2-3 times in person a week.   Patient also told that he has upcoming surgery in April after the surgery on his shoulder on November 14 did not do very well and he may have tear in his shoulder.  He admitted some frustration but denies any anger, violence, suicidal thoughts.  He denies any paranoia or any hallucination.  He is taking all his medication and feel it is keeping him is stable but admitted going through grief which requires some time.  He denies any feeling of hopelessness or worthlessness.  He had blood work in December and his hemoglobin A1c 6.4.  He has no tremors, shakes or any EPS.  He lives in his mobile home and he has a plan to go to the beach on the long weekend.  He has a good social network.  Past Psychiatric History: H/O depression and anxiety.  No h/o suicidal attempt or inpatient treatment.  Seen Dr. Jake Michaelis and given Zoloft, Klonopin and Cymbalta.  Later PCP added BuSpar and trazodone.  We tried Lamictal but did not help.     Outpatient Encounter Medications as of 07/07/2022  Medication Sig   ARIPiprazole (ABILIFY) 5 MG tablet Take one tab at bed time   atorvastatin (LIPITOR) 80 MG tablet Take 1 tablet (80 mg total) by mouth daily.   Blood Glucose Monitoring Suppl (ACCU-CHEK AVIVA PLUS) w/Device KIT Use to check blood sugars up to 4 times daily. ICD-10 E11.40   buPROPion (WELLBUTRIN XL) 300 MG 24 hr  tablet Take 1 tablet (300 mg total) by mouth daily.   busPIRone (BUSPAR) 15 MG tablet TAKE 1 TABLET BY MOUTH THREE TIMES DAILY   carvedilol (COREG) 12.5 MG tablet Take 1 tablet (12.5 mg total) by mouth 2 (two) times daily.   clonazePAM (KLONOPIN) 2 MG tablet Take 0.5 tablets (1 mg total) by mouth 2 (two) times daily as needed for anxiety.   dicyclomine (BENTYL) 20 MG tablet TAKE 1 TABLET BY MOUTH EVERY 6 HOURS   ezetimibe (ZETIA) 10 MG tablet Take 1 tablet (10 mg total) by mouth daily.   furosemide (LASIX) 20 MG tablet Take 1 tablet (20 mg total) by mouth 2 (two) times a week.   gabapentin (NEURONTIN) 300 MG  capsule 3 tablets in the morning and evening ,  2 tablets at night   glucose blood (ACCU-CHEK AVIVA PLUS) test strip Use to check blood sugars up to 4 times daily. ICD-10 E11.40   HYDROcodone-acetaminophen (NORCO) 10-325 MG tablet Take 1 tablet by mouth 2 (two) times daily as needed.   isosorbide mononitrate (IMDUR) 30 MG 24 hr tablet Take 1 tablet (30 mg total) by mouth daily.   Lancets (ACCU-CHEK MULTICLIX) lancets Use to to check blood sugars up to 4 times daily. ICD-10: E11.40   levothyroxine (SYNTHROID) 88 MCG tablet Take 1 tablet (88 mcg total) by mouth daily before breakfast.   losartan (COZAAR) 100 MG tablet Take 1 tablet by mouth once daily   metFORMIN (GLUCOPHAGE) 500 MG tablet TAKE 1 TABLET BY MOUTH TWICE DAILY WITH A MEAL   methocarbamol (ROBAXIN) 500 MG tablet Take 1 tablet (500 mg total) by mouth every 8 (eight) hours as needed.   Multiple Vitamin (MULTIVITAMIN WITH MINERALS) TABS tablet Take 1 tablet by mouth daily.   naloxone (NARCAN) nasal spray 4 mg/0.1 mL Use as needed for excessive sedation /opioid overdose   omeprazole (PRILOSEC) 40 MG capsule TAKE 1 CAPSULE BY MOUTH IN THE MORNING AND 1 CAPSULE IN THE EVENING   promethazine (PHENERGAN) 12.5 MG tablet Take 1 tablet (12.5 mg total) by mouth every 4 (four) hours as needed for nausea or vomiting. (Patient taking differently: Take 12.5 mg by mouth every 8 (eight) hours as needed for nausea or vomiting.)   traZODone (DESYREL) 100 MG tablet TAKE 1 & 1/2 (ONE & ONE-HALF) TABLETS BY MOUTH AT BEDTIME   No facility-administered encounter medications on file as of 07/07/2022.    Recent Results (from the past 2160 hour(s))  Lipid Profile     Status: Abnormal   Collection Time: 04/19/22  9:55 AM  Result Value Ref Range   Cholesterol 110 0 - 200 mg/dL    Comment: ATP III Classification       Desirable:  < 200 mg/dL               Borderline High:  200 - 239 mg/dL          High:  > = 240 mg/dL   Triglycerides 122.0 0.0 - 149.0 mg/dL     Comment: Normal:  <150 mg/dLBorderline High:  150 - 199 mg/dL   HDL 30.40 (L) >39.00 mg/dL   VLDL 24.4 0.0 - 40.0 mg/dL   LDL Cholesterol 55 0 - 99 mg/dL   Total CHOL/HDL Ratio 4     Comment:                Men          Women1/2 Average Risk     3.4  3.3Average Risk          5.0          4.42X Average Risk          9.6          7.13X Average Risk          15.0          11.0                       NonHDL 79.47     Comment: NOTE:  Non-HDL goal should be 30 mg/dL higher than patient's LDL goal (i.e. LDL goal of < 70 mg/dL, would have non-HDL goal of < 100 mg/dL)  Direct LDL     Status: None   Collection Time: 04/19/22  9:55 AM  Result Value Ref Range   Direct LDL 63.0 mg/dL    Comment: Optimal:  <100 mg/dLNear or Above Optimal:  100-129 mg/dLBorderline High:  130-159 mg/dLHigh:  160-189 mg/dLVery High:  >190 mg/dL  Comp Met (CMET)     Status: Abnormal   Collection Time: 04/19/22  9:55 AM  Result Value Ref Range   Sodium 138 135 - 145 mEq/L   Potassium 3.8 3.5 - 5.1 mEq/L   Chloride 97 96 - 112 mEq/L   CO2 36 (H) 19 - 32 mEq/L   Glucose, Bld 101 (H) 70 - 99 mg/dL   BUN 12 6 - 23 mg/dL   Creatinine, Ser 0.87 0.40 - 1.50 mg/dL   Total Bilirubin 0.6 0.2 - 1.2 mg/dL   Alkaline Phosphatase 121 (H) 39 - 117 U/L   AST 19 0 - 37 U/L   ALT 21 0 - 53 U/L   Total Protein 6.5 6.0 - 8.3 g/dL   Albumin 4.2 3.5 - 5.2 g/dL   GFR 95.82 >60.00 mL/min    Comment: Calculated using the CKD-EPI Creatinine Equation (2021)   Calcium 9.5 8.4 - 10.5 mg/dL  HgB A1c     Status: None   Collection Time: 04/19/22  9:55 AM  Result Value Ref Range   Hgb A1c MFr Bld 6.4 4.6 - 6.5 %    Comment: Glycemic Control Guidelines for People with Diabetes:Non Diabetic:  <6%Goal of Therapy: <7%Additional Action Suggested:  >8%   Microalbumin / creatinine urine ratio     Status: None   Collection Time: 04/19/22  9:55 AM  Result Value Ref Range   Microalb, Ur <0.7 0.0 - 1.9 mg/dL   Creatinine,U 51.2 mg/dL   Microalb  Creat Ratio 1.4 0.0 - 30.0 mg/g     Psychiatric Specialty Exam: Physical Exam  Review of Systems  Weight 221 lb (100.2 kg).There is no height or weight on file to calculate BMI.  General Appearance: Casual  Eye Contact:  Good  Speech:  Slow  Volume:  Decreased  Mood:  Depressed and Dysphoric  Affect:  Constricted  Thought Process:  Descriptions of Associations: Intact  Orientation:  Full (Time, Place, and Person)  Thought Content:  Rumination  Suicidal Thoughts:  No  Homicidal Thoughts:  No  Memory:  Immediate;   Good Recent;   Good Remote;   Good  Judgement:  Intact  Insight:  Present  Psychomotor Activity:  Decreased  Concentration:  Concentration: Good and Attention Span: Fair  Recall:  Good  Fund of Knowledge:  Good  Language:  Good  Akathisia:  No  Handed:  Right  AIMS (if indicated):     Assets:  Communication Skills Desire  for Improvement Housing Social Support Transportation  ADL's:  Intact  Cognition:  WNL  Sleep:  fair     Assessment/Plan: MDD (major depressive disorder), recurrent episode, moderate (HCC) - Plan: buPROPion (WELLBUTRIN XL) 300 MG 24 hr tablet, ARIPiprazole (ABILIFY) 5 MG tablet  Anxiety - Plan: buPROPion (WELLBUTRIN XL) 300 MG 24 hr tablet  Grief  Control is given about losing his 65 year old daughter.  Cause of death still unknown as autopsy is in the process.  He suspect probably aneurysm as history of cardiomyopathy.  Discussed grief process and keeping himself busy.  Patient is seeing therapist through the church twice a week and also goes every day Monday to Friday at his friend's shop to help him.  He also keep himself busy on the weekends and trying to clean the place.  He is very close to his son and talks to him every day and see him at least 2-3 times a week.  He is hoping to see the grandkids in the future and so far he is lucky that he has seen multiple times since daughter died.  He does not want to change the medication.  I  offered group therapy but patient is already busy in his life and does not feel he needed.  He is getting Klonopin, BuSpar and trazodone from his PCP Dr. Kalman Jewels.  Like to keep the Wellbutrin and Abilify as combination of medicine working.  I reviewed blood work results.  Hemoglobin A1c 6.4 which was drawn in December.  Though he is not looking forward to have a surgery but he has no choice and he is hoping after the surgery he may have better control on his shoulder pain.  Discussed safety concerns and any time having active suicidal thoughts or homicidal thought that he need to call 911 with a full emergency room.  We will follow-up in 3 months but I asked him if he needed we can schedule him earlier.  Patient agreed with the plan.   Follow Up Instructions:     I discussed the assessment and treatment plan with the patient. The patient was provided an opportunity to ask questions and all were answered. The patient agreed with the plan and demonstrated an understanding of the instructions.   The patient was advised to call back or seek an in-person evaluation if the symptoms worsen or if the condition fails to improve as anticipated.    Collaboration of Care: Other provider involved in patient's care AEB notes are available in epic to review  Patient/Guardian was advised Release of Information must be obtained prior to any record release in order to collaborate their care with an outside provider. Patient/Guardian was advised if they have not already done so to contact the registration department to sign all necessary forms in order for [place] to release information regarding their care.   Consent: Patient/Guardian gives verbal consent for treatment and assignment of benefits for services provided during this visit. Patient/Guardian expressed understanding and agreed to proceed.     I provided 28 minutes of non face to face time during this encounter.  Kathlee Nations, MD 07/07/2022

## 2022-07-08 ENCOUNTER — Other Ambulatory Visit: Payer: Self-pay | Admitting: Internal Medicine

## 2022-07-08 DIAGNOSIS — F411 Generalized anxiety disorder: Secondary | ICD-10-CM

## 2022-07-08 DIAGNOSIS — F331 Major depressive disorder, recurrent, moderate: Secondary | ICD-10-CM

## 2022-07-10 ENCOUNTER — Other Ambulatory Visit: Payer: Self-pay

## 2022-07-11 ENCOUNTER — Ambulatory Visit (INDEPENDENT_AMBULATORY_CARE_PROVIDER_SITE_OTHER): Payer: Medicare HMO

## 2022-07-11 DIAGNOSIS — E538 Deficiency of other specified B group vitamins: Secondary | ICD-10-CM | POA: Diagnosis not present

## 2022-07-11 MED ORDER — CYANOCOBALAMIN 1000 MCG/ML IJ SOLN
1000.0000 ug | Freq: Once | INTRAMUSCULAR | Status: AC
  Administered 2022-07-11: 1000 ug via INTRAMUSCULAR

## 2022-07-11 NOTE — Progress Notes (Signed)
Patient arrived for a B12 injection and it was administered into his left deltoid. Patient tolerated the injection well and did not show any signs of distress or voice any concerns.

## 2022-07-13 ENCOUNTER — Other Ambulatory Visit: Payer: Self-pay | Admitting: Internal Medicine

## 2022-07-19 ENCOUNTER — Other Ambulatory Visit: Payer: Self-pay | Admitting: Internal Medicine

## 2022-07-20 NOTE — Telephone Encounter (Signed)
Refilled: 01/16/2022 Last OV: 04/19/2022 Next OV: 07/26/2022

## 2022-07-26 ENCOUNTER — Ambulatory Visit (INDEPENDENT_AMBULATORY_CARE_PROVIDER_SITE_OTHER): Payer: Medicare HMO | Admitting: Internal Medicine

## 2022-07-26 ENCOUNTER — Encounter: Payer: Self-pay | Admitting: Internal Medicine

## 2022-07-26 VITALS — BP 128/72 | HR 95 | Temp 98.2°F | Ht 72.0 in | Wt 231.0 lb

## 2022-07-26 DIAGNOSIS — E034 Atrophy of thyroid (acquired): Secondary | ICD-10-CM | POA: Diagnosis not present

## 2022-07-26 DIAGNOSIS — I10 Essential (primary) hypertension: Secondary | ICD-10-CM | POA: Diagnosis not present

## 2022-07-26 DIAGNOSIS — E1169 Type 2 diabetes mellitus with other specified complication: Secondary | ICD-10-CM

## 2022-07-26 DIAGNOSIS — F411 Generalized anxiety disorder: Secondary | ICD-10-CM

## 2022-07-26 DIAGNOSIS — E1140 Type 2 diabetes mellitus with diabetic neuropathy, unspecified: Secondary | ICD-10-CM

## 2022-07-26 DIAGNOSIS — Z01818 Encounter for other preprocedural examination: Secondary | ICD-10-CM

## 2022-07-26 DIAGNOSIS — M4802 Spinal stenosis, cervical region: Secondary | ICD-10-CM

## 2022-07-26 DIAGNOSIS — M2550 Pain in unspecified joint: Secondary | ICD-10-CM

## 2022-07-26 DIAGNOSIS — F331 Major depressive disorder, recurrent, moderate: Secondary | ICD-10-CM

## 2022-07-26 DIAGNOSIS — E785 Hyperlipidemia, unspecified: Secondary | ICD-10-CM

## 2022-07-26 DIAGNOSIS — G8929 Other chronic pain: Secondary | ICD-10-CM

## 2022-07-26 DIAGNOSIS — G894 Chronic pain syndrome: Secondary | ICD-10-CM

## 2022-07-26 MED ORDER — EZETIMIBE 10 MG PO TABS: 10.0000 mg | ORAL_TABLET | Freq: Every day | ORAL | 3 refills | Status: DC

## 2022-07-26 MED ORDER — CLONAZEPAM 2 MG PO TABS: ORAL_TABLET | ORAL | 2 refills | Status: DC

## 2022-07-26 MED ORDER — HYDROCODONE-ACETAMINOPHEN 10-325 MG PO TABS: 1.0000 | ORAL_TABLET | Freq: Two times a day (BID) | ORAL | 0 refills | Status: DC | PRN

## 2022-07-26 MED ORDER — DICYCLOMINE HCL 20 MG PO TABS: 20.0000 mg | ORAL_TABLET | Freq: Four times a day (QID) | ORAL | 0 refills | Status: DC

## 2022-07-26 MED ORDER — ATORVASTATIN CALCIUM 80 MG PO TABS: 80.0000 mg | ORAL_TABLET | Freq: Every day | ORAL | 3 refills | Status: DC

## 2022-07-26 MED ORDER — BUSPIRONE HCL 15 MG PO TABS: 15.0000 mg | ORAL_TABLET | Freq: Three times a day (TID) | ORAL | 1 refills | Status: DC

## 2022-07-26 NOTE — Progress Notes (Signed)
Subjective:  Patient ID: [name], male    DOB: [birth year]  Age: 57 y.o. MRN: [id number]  CC: The primary encounter diagnosis was Essential hypertension. Diagnoses of GAD (generalized anxiety disorder), MDD (major depressive disorder), recurrent episode, moderate, Hyperlipidemia associated with type 2 diabetes mellitus, Type 2 diabetes mellitus with diabetic neuropathy, without long-term current use of insulin, Hypothyroidism due to acquired atrophy of thyroid, Spinal stenosis of cervical region, Preoperative evaluation to rule out surgical contraindication, Chronic pain of multiple joints, and Chronic pain associated with significant psychosocial dysfunction were also pertinent to this visit.   HPI EBON KETCHUM presents for follow up on chronic issues (type 2 DM,  GAD,  chronic pain management) and preoperative evaluation   Chief Complaint  Patient presents with   Medical Management of Chronic Issues    3 month follow up    1) chronic pain : multiple sites.  Right shoulder ,   lower back.    2) Preoperative evaluation  has decided to have right reverse shoulder replacement by Dr [name] to manage his persistent   pain which was not aided by the rotator cuff tear reapir that was done in November.  Surgery is scheduled for .  April 18.  He has not had any episodes of chest pain since his last surgery in NOvember.    2) Type 2 DM: taking metformin , not checking sugars.  No neuropathy  3) HTN:  Hypertension: patient checks blood pressure twice weekly at home.  Readings have been for the most part < 130/80 at rest . Patient is following a reduce salt diet most days and is taking medications as prescribed carvedilol 12.5 mg bid  imdur 30 and losartan 100  4) GAD: Taking clonazepam 2 mg daily complicated by depression /grief over losing his wife [name] 1 year ago,  followed by recent unexpected death of his 70 yr old daughter, who he found deceased several weeks ago,  autopsy pending  .    Outpatient Medications Prior to Visit  Medication Sig Dispense Refill   ARIPiprazole (ABILIFY) 5 MG tablet Take one tab at bed time 90 tablet 0   Blood Glucose Monitoring Suppl (ACCU-CHEK AVIVA PLUS) w/Device KIT Use to check blood sugars up to 4 times daily. ICD-10 E11.40 1 kit 0   buPROPion (WELLBUTRIN XL) 300 MG 24 hr tablet Take 1 tablet (300 mg total) by mouth daily. 90 tablet 0   carvedilol (COREG) 12.5 MG tablet Take 1 tablet (12.5 mg total) by mouth 2 (two) times daily. 180 tablet 3   furosemide (LASIX) 20 MG tablet Take 1 tablet (20 mg total) by mouth 2 (two) times a week. 4520 tablet 3   gabapentin (NEURONTIN) 300 MG capsule 3 tablets in the morning and evening ,  2 tablets at night 240 capsule 5   glucose blood (ACCU-CHEK AVIVA PLUS) test strip Use to check blood sugars up to 4 times daily. ICD-10 E11.40 400 each 0   isosorbide mononitrate (IMDUR) 30 MG 24 hr tablet Take 1 tablet (30 mg total) by mouth daily. 90 tablet 3   Lancets (ACCU-CHEK MULTICLIX) lancets Use to to check blood sugars up to 4 times daily. ICD-10: E11.40 400 each 0   levothyroxine (SYNTHROID) 88 MCG tablet Take 1 tablet (88 mcg total) by mouth daily before breakfast. 90 tablet 1   losartan (COZAAR) 100 MG tablet Take 1 tablet by mouth once daily 90 tablet 3   metFORMIN (GLUCOPHAGE) 500 MG tablet TAKE 1  TABLET BY MOUTH TWICE DAILY WITH A MEAL 180 tablet 1   methocarbamol (ROBAXIN) 500 MG tablet Take 1 tablet (500 mg total) by mouth every 8 (eight) hours as needed. 90 tablet 2   Multiple Vitamin (MULTIVITAMIN WITH MINERALS) TABS tablet Take 1 tablet by mouth daily.     naloxone (NARCAN) nasal spray 4 mg/0.1 mL Use as needed for excessive sedation /opioid overdose 1 each 1   omeprazole (PRILOSEC) 40 MG capsule TAKE 1 CAPSULE BY MOUTH IN THE MORNING AND 1 CAPSULE IN THE EVENING 180 capsule 3   promethazine (PHENERGAN) 12.5 MG tablet Take 1 tablet (12.5 mg total) by mouth every 4 (four) hours as needed for nausea  or vomiting. (Patient taking differently: Take 12.5 mg by mouth every 8 (eight) hours as needed for nausea or vomiting.) 20 tablet 0   traZODone (DESYREL) 100 MG tablet TAKE 1 & 1/2 (ONE & ONE-HALF) TABLETS BY MOUTH AT BEDTIME 135 tablet 2   atorvastatin (LIPITOR) 80 MG tablet Take 1 tablet (80 mg total) by mouth daily. 180 tablet 3   busPIRone (BUSPAR) 15 MG tablet TAKE 1 TABLET BY MOUTH THREE TIMES DAILY 90 tablet 0   clonazePAM (KLONOPIN) 2 MG tablet TAKE 1/2 (ONE-HALF) TABLET BY MOUTH TWICE DAILY AS NEEDED FOR ANXIETY 30 tablet 0   dicyclomine (BENTYL) 20 MG tablet TAKE 1 TABLET BY MOUTH EVERY 6 HOURS 120 tablet 0   ezetimibe (ZETIA) 10 MG tablet Take 1 tablet (10 mg total) by mouth daily. 90 tablet 0   HYDROcodone-acetaminophen (NORCO) 10-325 MG tablet Take 1 tablet by mouth 2 (two) times daily as needed. 60 tablet 0   No facility-administered medications prior to visit.    Review of Systems;  Patient denies headache, fevers, malaise, unintentional weight loss, skin rash, eye pain, sinus congestion and sinus pain, sore throat, dysphagia,  hemoptysis , cough, dyspnea, wheezing, chest pain, palpitations, orthopnea, edema, abdominal pain, nausea, melena, diarrhea, constipation, flank pain, dysuria, hematuria, urinary  Frequency, nocturia, numbness, tingling, seizures,  Focal weakness, Loss of consciousness,  Tremor, insomnia, depression, anxiety, and suicidal ideation.      Objective:  BP 128/72   Pulse 95   Temp 98.2 F (36.8 C) (Oral)   Ht 6' (1.829 m)   Wt 231 lb (104.8 kg)   SpO2 95%   BMI 31.33 kg/m   BP Readings from Last 3 Encounters:  07/26/22 128/72  04/25/22 (!) 156/80  04/19/22 (!) 140/84    Wt Readings from Last 3 Encounters:  07/26/22 231 lb (104.8 kg)  04/25/22 225 lb 4 oz (102.2 kg)  04/19/22 213 lb 12.8 oz (97 kg)    Physical Exam Vitals reviewed.  Constitutional:      General: He is not in acute distress.    Appearance: Normal appearance. He is normal  weight. He is not ill-appearing, toxic-appearing or diaphoretic.  HENT:     Head: Normocephalic.  Eyes:     General: No scleral icterus.       Right eye: No discharge.        Left eye: No discharge.     Conjunctiva/sclera: Conjunctivae normal.  Cardiovascular:     Rate and Rhythm: Normal rate and regular rhythm.     Heart sounds: Normal heart sounds.  Pulmonary:     Effort: Pulmonary effort is normal. No respiratory distress.     Breath sounds: Normal breath sounds.  Musculoskeletal:        General: Normal range of motion.  Cervical back: Normal range of motion.  Skin:    General: Skin is warm and dry.  Neurological:     General: No focal deficit present.     Mental Status: He is alert and oriented to person, place, and time. Mental status is at baseline.  Psychiatric:        Mood and Affect: Mood is depressed.        Behavior: Behavior normal.        Thought Content: Thought content normal.        Judgment: Judgment normal.    Lab Results  Component Value Date   HGBA1C 7.1 (H) 07/26/2022   HGBA1C 6.4 04/19/2022   HGBA1C 6.8 (H) 01/16/2022    Lab Results  Component Value Date   CREATININE 1.16 07/26/2022   CREATININE 0.87 04/19/2022   CREATININE 1.00 02/28/2022    Lab Results  Component Value Date   WBC 6.2 02/28/2022   HGB 13.9 02/28/2022   HCT 41.0 02/28/2022   PLT 261 02/28/2022   GLUCOSE 150 (H) 07/26/2022   CHOL 185 07/26/2022   TRIG 368.0 (H) 07/26/2022   HDL 33.70 (L) 07/26/2022   LDLDIRECT 129.0 07/26/2022   LDLCALC 55 04/19/2022   ALT 16 07/26/2022   AST 15 07/26/2022   NA 138 07/26/2022   K 4.2 07/26/2022   CL 100 07/26/2022   CREATININE 1.16 07/26/2022   BUN 18 07/26/2022   CO2 31 07/26/2022   TSH 2.16 07/26/2022   PSA 0.70 10/16/2017   INR 0.98 08/27/2015   HGBA1C 7.1 (H) 07/26/2022   MICROALBUR <0.7 04/19/2022    CT SHOULDER RIGHT WO CONTRAST  Result Date: 06/27/2022 CLINICAL DATA:  Right shoulder pain EXAM: CT OF THE UPPER RIGHT  EXTREMITY WITHOUT CONTRAST TECHNIQUE: Multidetector CT imaging of the upper right extremity was performed according to the standard protocol. RADIATION DOSE REDUCTION: This exam was performed according to the departmental dose-optimization program which includes automated exposure control, adjustment of the mA and/or kV according to patient size and/or use of iterative reconstruction technique. COMPARISON:  MRI 06/21/2022 FINDINGS: Bones/Joint/Cartilage No acute fracture. No dislocation. Mild glenohumeral joint osteoarthritis manifested by joint space narrowing and marginal osteophyte formation. No significant subchondral cystic changes within the glenoid. A moderate-sized glenohumeral joint effusion is evident. High-riding humeral head abutting the undersurface of the acromion. Evidence of prior rotator cuff repair with multiple surgical anchor sites in the humeral head. Postsurgical changes to the acromioclavicular joint. Fluid is seen within the subacromial-subdeltoid bursal space. Remaining visualized osseous structures appear grossly intact. Ligaments Suboptimally assessed by CT. Muscles and Tendons Rotator cuff tears with advanced infraspinatus muscle atrophy, better characterized by previous MRI. Soft tissues No soft tissue fluid collection or hematoma. No axillary lymphadenopathy. Visualized lung field is clear. Aortic atherosclerosis. IMPRESSION: 1. Degenerative changes of the right shoulder. High-riding humeral head compatible with known rotator cuff tears. 2. Aortic atherosclerosis. (ICD10-I70.0). Electronically Signed   By: Duanne Guess D.O.   On: 06/27/2022 15:27    Assessment & Plan:  .Essential hypertension Assessment & Plan: Improved control on carvedilol 12.5 mg bid , imdur, losartan .  Lab Results  Component Value Date   CREATININE 1.16 07/26/2022   Lab Results  Component Value Date   NA 138 07/26/2022   K 4.2 07/26/2022   CL 100 07/26/2022   CO2 31 07/26/2022     Orders: -      Comprehensive metabolic panel  GAD (generalized anxiety disorder) -     busPIRone HCl;  Take 1 tablet (15 mg total) by mouth 3 (three) times daily.  Dispense: 270 tablet; Refill: 1  MDD (major depressive disorder), recurrent episode, moderate Assessment & Plan: Complicated by grief.Managed by  Dr Lolly Mustache ; encouraged to  consider joining a church that has grief counsellors   Orders: -     busPIRone HCl; Take 1 tablet (15 mg total) by mouth 3 (three) times daily.  Dispense: 270 tablet; Refill: 1  Hyperlipidemia associated with type 2 diabetes mellitus Assessment & Plan: Hi is risk for CAD is managed with atorvastatin 80 mg daily and Zetia  but is not at goal.  Will discuss change to Repatha   Lab Results  Component Value Date   CHOL 185 07/26/2022   HDL 33.70 (L) 07/26/2022   LDLCALC 55 04/19/2022   LDLDIRECT 129.0 07/26/2022   TRIG 368.0 (H) 07/26/2022   CHOLHDL 5 07/26/2022   Lab Results  Component Value Date   HGBA1C 7.1 (H) 07/26/2022     Orders: -     Lipid panel -     LDL cholesterol, direct  Type 2 diabetes mellitus with diabetic neuropathy, without long-term current use of insulin Assessment & Plan: Managed with Jardiance and  metformin  . Taking  gabapentin T THE maxImal dose (900 qam and q pm  600 mg qhs) A1c and lipids are at goal.    Lab Results  Component Value Date   HGBA1C 7.1 (H) 07/26/2022   Lab Results  Component Value Date   CHOL 185 07/26/2022   HDL 33.70 (L) 07/26/2022   LDLCALC 55 04/19/2022   LDLDIRECT 129.0 07/26/2022   TRIG 368.0 (H) 07/26/2022   CHOLHDL 5 07/26/2022      Orders: -     Hemoglobin A1c -     Comprehensive metabolic panel  Hypothyroidism due to acquired atrophy of thyroid -     TSH  Spinal stenosis of cervical region Assessment & Plan: His pain radiates to left shoulder due to multi level disk disease by August 2020 MRI showing moderate spinal stenosis ct C5-6 level with mild cord flattening and moderate/severe  let neural foraminal narrowing . Refill history confirmed via [place] Controlled Substance databas, accessed by me today..he has not had any ER visits  And has not requested any early refills.  Refill history was confirmed via [place] Controlled Substance database by me today during  visit and there have been no prescriptions of controlled substances filled from any providers other than me. .  continue Vicodin for pain management   Preoperative evaluation to rule out surgical contraindication -     EKG 12-Lead  Chronic pain of multiple joints  Chronic pain associated with significant psychosocial dysfunction Assessment & Plan: He requires management of pain involving both knees  Cervical spine and shoulders with hydrocodone, which was refilled today.   Refill history confirmed via [place] Controlled Substance database, accessed by me today..   Other orders -     Atorvastatin Calcium; Take 1 tablet (80 mg total) by mouth daily.  Dispense: 180 tablet; Refill: 3 -     Dicyclomine HCl; Take 1 tablet (20 mg total) by mouth every 6 (six) hours.  Dispense: 120 tablet; Refill: 0 -     Ezetimibe; Take 1 tablet (10 mg total) by mouth daily.  Dispense: 90 tablet; Refill: 3 -     HYDROcodone-Acetaminophen; Take 1 tablet by mouth 2 (two) times daily as needed.  Dispense: 60 tablet; Refill: 0 -     clonazePAM;  TAKE 1/2 (ONE-HALF) TABLET BY MOUTH TWICE DAILY AS NEEDED FOR ANXIETY  Dispense: 30 tablet; Refill: 2     I provided 32 minutes of face-to-face time during this encounter reviewing patient's last visit with me, patient's  most recent visit with cardiology and orthopedics,  recent surgical and non surgical procedures, previous  labs and imaging studies, counseling on currently addressed issues,  and post visit ordering to diagnostics and therapeutics .   Follow-up: Return in about 3 months (around 10/25/2022) for follow up diabetes.   [name], MD

## 2022-07-26 NOTE — Patient Instructions (Signed)
Dr Rockey Situ is out of the office until April 7,  but I will clear you medically if Dr Marlou Sa requires it for your surgery  I will refill your hydrocodone for 3 more months.   Please schedule a 3 month follow up to avoid lapses in prescription

## 2022-07-26 NOTE — Assessment & Plan Note (Signed)
His pain radiates to left shoulder due to multi level disk disease by August 2020 MRI showing moderate spinal stenosis ct C5-6 level with mild cord flattening and moderate/severe let neural foraminal narrowing . Refill history confirmed via [place] Controlled Substance databas, accessed by me today..he has not had any ER visits  And has not requested any early refills.  Refill history was confirmed via [place] Controlled Substance database by me today during  visit and there have been no prescriptions of controlled substances filled from any providers other than me. .  continue Vicodin for pain management 

## 2022-07-27 ENCOUNTER — Encounter: Payer: Self-pay | Admitting: Internal Medicine

## 2022-07-27 LAB — COMPREHENSIVE METABOLIC PANEL
ALT: 16 U/L (ref 0–53)
AST: 15 U/L (ref 0–37)
Albumin: 4 g/dL (ref 3.5–5.2)
Alkaline Phosphatase: 112 U/L (ref 39–117)
BUN: 18 mg/dL (ref 6–23)
CO2: 31 mEq/L (ref 19–32)
Calcium: 9.1 mg/dL (ref 8.4–10.5)
Chloride: 100 mEq/L (ref 96–112)
Creatinine, Ser: 1.16 mg/dL (ref 0.40–1.50)
GFR: 69.81 mL/min (ref >60.00)
Glucose, Bld: 150 mg/dL — ABNORMAL HIGH (ref 70–99)
Potassium: 4.2 mEq/L (ref 3.5–5.1)
Sodium: 138 mEq/L (ref 135–145)
Total Bilirubin: 0.3 mg/dL (ref 0.2–1.2)
Total Protein: 6.2 g/dL (ref 6.0–8.3)

## 2022-07-27 LAB — LIPID PANEL
Cholesterol: 185 mg/dL (ref 0–200)
HDL: 33.7 mg/dL — ABNORMAL LOW (ref >39.00)
NonHDL: 151.55
Total CHOL/HDL Ratio: 5
Triglycerides: 368 mg/dL — ABNORMAL HIGH (ref 0.0–149.0)
VLDL: 73.6 mg/dL — ABNORMAL HIGH (ref 0.0–40.0)

## 2022-07-27 LAB — TSH: TSH: 2.16 u[IU]/mL (ref 0.35–5.50)

## 2022-07-27 LAB — HEMOGLOBIN A1C: Hgb A1c MFr Bld: 7.1 % — ABNORMAL HIGH (ref 4.6–6.5)

## 2022-07-27 LAB — LDL CHOLESTEROL, DIRECT: Direct LDL: 129 mg/dL

## 2022-07-29 NOTE — Assessment & Plan Note (Signed)
Complicated by grief.Managed by  Dr Lolly Mustache ; encouraged to  consider joining a church that has grief counsellors

## 2022-07-29 NOTE — Assessment & Plan Note (Addendum)
Improved control on carvedilol 12.5 mg bid , imdur, losartan .  Lab Results  Component Value Date   CREATININE 1.16 07/26/2022   Lab Results  Component Value Date   NA 138 07/26/2022   K 4.2 07/26/2022   CL 100 07/26/2022   CO2 31 07/26/2022

## 2022-07-29 NOTE — Assessment & Plan Note (Signed)
He requires management of pain involving both knees  Cervical spine and shoulders with hydrocodone, which was refilled today.   Refill history confirmed via [place] Controlled Substance database, accessed by me today.Marland Kitchen

## 2022-07-29 NOTE — Assessment & Plan Note (Addendum)
Hi is risk for CAD is managed with atorvastatin 80 mg daily and Zetia  but is not at goal.  Will discuss change to Repatha   Lab Results  Component Value Date   CHOL 185 07/26/2022   HDL 33.70 (L) 07/26/2022   LDLCALC 55 04/19/2022   LDLDIRECT 129.0 07/26/2022   TRIG 368.0 (H) 07/26/2022   CHOLHDL 5 07/26/2022   Lab Results  Component Value Date   HGBA1C 7.1 (H) 07/26/2022

## 2022-07-29 NOTE — Assessment & Plan Note (Signed)
Managed with Jardiance and  metformin  . Taking  gabapentin T THE maxImal dose (900 qam and q pm  600 mg qhs) A1c and lipids are at goal.    Lab Results  Component Value Date   HGBA1C 7.1 (H) 07/26/2022   Lab Results  Component Value Date   CHOL 185 07/26/2022   HDL 33.70 (L) 07/26/2022   LDLCALC 55 04/19/2022   LDLDIRECT 129.0 07/26/2022   TRIG 368.0 (H) 07/26/2022   CHOLHDL 5 07/26/2022

## 2022-07-31 ENCOUNTER — Other Ambulatory Visit: Payer: Medicare HMO

## 2022-08-01 NOTE — Pre-Procedure Instructions (Signed)
Surgical Instructions    Your procedure is scheduled on Thursday, April 18th.  Report to [facility] Main Entrance "A" at 05:30 A.M., then check in with the Admitting office.  Call this number if you have problems the morning of surgery:  [phone]  If you have any questions prior to your surgery date call [phone]: Open Monday-Friday 8am-4pm If you experience any cold or flu symptoms such as cough, fever, chills, shortness of breath, etc. between now and your scheduled surgery, please notify us at the above number.     Remember:  Do not eat after midnight the night before your surgery  You may drink clear liquids until 04:30 AM the morning of your surgery.   Clear liquids allowed are: Water, Non-Citrus Juices (without pulp), Carbonated Beverages, Clear Tea, Black Coffee Only (NO MILK, CREAM OR POWDERED CREAMER of any kind), and Gatorade.    Take these medicines the morning of surgery with A SIP OF WATER  atorvastatin (LIPITOR)  buPROPion (WELLBUTRIN XL)  busPIRone (BUSPAR)  carvedilol (COREG)  dicyclomine (BENTYL)  ezetimibe (ZETIA)  gabapentin (NEURONTIN)  isosorbide mononitrate (IMDUR)  levothyroxine (SYNTHROID)  omeprazole (PRILOSEC)   If needed: clonazePAM (KLONOPIN)  HYDROcodone-acetaminophen (NORCO)  methocarbamol (ROBAXIN)  promethazine (PHENERGAN)    As of today, STOP taking any Aspirin (unless otherwise instructed by your surgeon) Aleve, Naproxen, Ibuprofen, Motrin, Advil, Goody's, BC's, all herbal medications, fish oil, and all vitamins.   WHAT DO I DO ABOUT MY DIABETES MEDICATION?   Do not take metFORMIN (GLUCOPHAGE) the morning of surgery.     HOW TO MANAGE YOUR DIABETES BEFORE AND AFTER SURGERY  Why is it important to control my blood sugar before and after surgery? Improving blood sugar levels before and after surgery helps healing and can limit problems. A way of improving blood sugar control is eating a healthy diet by:  Eating less sugar  and carbohydrates  Increasing activity/exercise  Talking with your doctor about reaching your blood sugar goals High blood sugars (greater than 180 mg/dL) can raise your risk of infections and slow your recovery, so you will need to focus on controlling your diabetes during the weeks before surgery. Make sure that the doctor who takes care of your diabetes knows about your planned surgery including the date and location.  How do I manage my blood sugar before surgery? Check your blood sugar at least 4 times a day, starting 2 days before surgery, to make sure that the level is not too high or low.  Check your blood sugar the morning of your surgery when you wake up and every 2 hours until you get to the Short Stay unit.  If your blood sugar is less than 70 mg/dL, you will need to treat for low blood sugar: Do not take insulin. Treat a low blood sugar (less than 70 mg/dL) with  cup of clear juice (cranberry or apple), 4 glucose tablets, OR glucose gel. Recheck blood sugar in 15 minutes after treatment (to make sure it is greater than 70 mg/dL). If your blood sugar is not greater than 70 mg/dL on recheck, call [phone] for further instructions. Report your blood sugar to the short stay nurse when you get to Short Stay.  If you are admitted to the hospital after surgery: Your blood sugar will be checked by the staff and you will probably be given insulin after surgery (instead of oral diabetes medicines) to make sure you have good blood sugar levels. The goal for blood sugar control after  surgery is 80-180 mg/dL.                    Do NOT Smoke (Tobacco/Vaping) for 24 hours prior to your procedure.  If you use a CPAP at night, you may bring your mask/headgear for your overnight stay.   Contacts, glasses, piercing's, hearing aid's, dentures or partials may not be worn into surgery, please bring cases for these belongings.    For patients admitted to the hospital, discharge time will be  determined by your treatment team.   Patients discharged the day of surgery will not be allowed to drive home, and someone needs to stay with them for 24 hours.  SURGICAL WAITING ROOM VISITATION Patients having surgery or a procedure may have no more than 2 support people in the waiting area - these visitors may rotate.   Children under the age of 58 must have an adult with them who is not the patient. If the patient needs to stay at the hospital during part of their recovery, the visitor guidelines for inpatient rooms apply. Pre-op nurse will coordinate an appropriate time for 1 support person to accompany patient in pre-op.  This support person may not rotate.   Please refer to the [facility] website for the visitor guidelines for Inpatients (after your surgery is over and you are in a regular room).    Special instructions:   [place]- Preparing For Surgery  Before surgery, you can play an important role. Because skin is not sterile, your skin needs to be as free of germs as possible. You can reduce the number of germs on your skin by washing with CHG (chlorahexidine gluconate) Soap before surgery.  CHG is an antiseptic cleaner which kills germs and bonds with the skin to continue killing germs even after washing.    Oral Hygiene is also important to reduce your risk of infection.  Remember - BRUSH YOUR TEETH THE MORNING OF SURGERY WITH YOUR REGULAR TOOTHPASTE  Please do not use if you have an allergy to CHG or antibacterial soaps. If your skin becomes reddened/irritated stop using the CHG.  Do not shave (including legs and underarms) for at least 48 hours prior to first CHG shower. It is OK to shave your face.  Please follow these instructions carefully.   Shower the NIGHT BEFORE SURGERY and the MORNING OF SURGERY  If you chose to wash your hair, wash your hair first as usual with your normal shampoo.  After you shampoo, rinse your hair and body thoroughly to remove the  shampoo.  Use CHG Soap as you would any other liquid soap. You can apply CHG directly to the skin and wash gently with a scrungie or a clean washcloth.   Apply the CHG Soap to your body ONLY FROM THE NECK DOWN.  Do not use on open wounds or open sores. Avoid contact with your eyes, ears, mouth and genitals (private parts). Wash Face and genitals (private parts)  with your normal soap.   Wash thoroughly, paying special attention to the area where your surgery will be performed.  Thoroughly rinse your body with warm water from the neck down.  DO NOT shower/wash with your normal soap after using and rinsing off the CHG Soap.  Pat yourself dry with a CLEAN TOWEL.  Wear CLEAN PAJAMAS to bed the night before surgery  Place CLEAN SHEETS on your bed the night before your surgery  DO NOT SLEEP WITH PETS.   Day of Surgery: Take  a shower with CHG soap. Do not wear jewelry  Do not wear lotions, powders, colognes, or deodorant. Men may shave face and neck. Do not bring valuables to the hospital. [facility] is not responsible for any belongings or valuables.  Wear Clean/Comfortable clothing the morning of surgery Remember to brush your teeth WITH YOUR REGULAR TOOTHPASTE.   Please read over the following fact sheets that you were given.    If you received a COVID test during your pre-op visit  it is requested that you wear a mask when out in public, stay away from anyone that may not be feeling well and notify your surgeon if you develop symptoms. If you have been in contact with anyone that has tested positive in the last 10 days please notify you surgeon.

## 2022-08-02 ENCOUNTER — Encounter (HOSPITAL_COMMUNITY): Payer: Self-pay

## 2022-08-02 ENCOUNTER — Encounter (HOSPITAL_COMMUNITY)
Admission: RE | Admit: 2022-08-02 | Discharge: 2022-08-02 | Disposition: A | Discharge status: Home / Self Care | Payer: Medicare HMO | Source: Ambulatory Visit | Attending: Orthopedic Surgery | Admitting: Orthopedic Surgery

## 2022-08-02 ENCOUNTER — Other Ambulatory Visit: Payer: Medicare HMO

## 2022-08-02 ENCOUNTER — Other Ambulatory Visit: Payer: Self-pay

## 2022-08-02 VITALS — HR 79 | Temp 98.1°F | Resp 18 | Ht 72.0 in | Wt 231.8 lb

## 2022-08-02 DIAGNOSIS — E1140 Type 2 diabetes mellitus with diabetic neuropathy, unspecified: Secondary | ICD-10-CM | POA: Insufficient documentation

## 2022-08-02 DIAGNOSIS — M12811 Other specific arthropathies, not elsewhere classified, right shoulder: Secondary | ICD-10-CM | POA: Insufficient documentation

## 2022-08-02 DIAGNOSIS — E119 Type 2 diabetes mellitus without complications: Secondary | ICD-10-CM | POA: Diagnosis not present

## 2022-08-02 DIAGNOSIS — K760 Fatty (change of) liver, not elsewhere classified: Secondary | ICD-10-CM | POA: Diagnosis not present

## 2022-08-02 DIAGNOSIS — I10 Essential (primary) hypertension: Secondary | ICD-10-CM | POA: Diagnosis not present

## 2022-08-02 DIAGNOSIS — I2510 Atherosclerotic heart disease of native coronary artery without angina pectoris: Secondary | ICD-10-CM | POA: Diagnosis not present

## 2022-08-02 DIAGNOSIS — Z01812 Encounter for preprocedural laboratory examination: Secondary | ICD-10-CM | POA: Insufficient documentation

## 2022-08-02 DIAGNOSIS — Z01818 Encounter for other preprocedural examination: Secondary | ICD-10-CM

## 2022-08-02 LAB — CBC
HCT: 46.2 % (ref 39.0–52.0)
Hemoglobin: 15 g/dL (ref 13.0–17.0)
MCH: 30.5 pg (ref 26.0–34.0)
MCHC: 32.5 g/dL (ref 30.0–36.0)
MCV: 94.1 fL (ref 80.0–100.0)
Platelets: 240 10*3/uL (ref 150–400)
RBC: 4.91 MIL/uL (ref 4.22–5.81)
RDW: 13.4 % (ref 11.5–15.5)
WBC: 5.7 10*3/uL (ref 4.0–10.5)
nRBC: 0 % (ref 0.0–0.2)

## 2022-08-02 LAB — BASIC METABOLIC PANEL
Anion gap: 7 (ref 5–15)
BUN: 14 mg/dL (ref 6–20)
CO2: 29 mmol/L (ref 22–32)
Calcium: 8.7 mg/dL — ABNORMAL LOW (ref 8.9–10.3)
Chloride: 100 mmol/L (ref 98–111)
Creatinine, Ser: 1.2 mg/dL (ref 0.61–1.24)
GFR, Estimated: >60 mL/min (ref >60)
Glucose, Bld: 160 mg/dL — ABNORMAL HIGH (ref 70–99)
Potassium: 3.9 mmol/L (ref 3.5–5.1)
Sodium: 136 mmol/L (ref 135–145)

## 2022-08-02 LAB — URINALYSIS, ROUTINE W REFLEX MICROSCOPIC
Bacteria, UA: NONE SEEN
Bilirubin Urine: NEGATIVE
Glucose, UA: >=500 mg/dL — AB
Hgb urine dipstick: NEGATIVE
Ketones, ur: NEGATIVE mg/dL
Leukocytes,Ua: NEGATIVE
Nitrite: NEGATIVE
Protein, ur: NEGATIVE mg/dL
Specific Gravity, Urine: 1.022 (ref 1.005–1.030)
pH: 5 (ref 5.0–8.0)

## 2022-08-02 LAB — GLUCOSE, CAPILLARY: Glucose-Capillary: 187 mg/dL — ABNORMAL HIGH (ref 70–99)

## 2022-08-02 LAB — SURGICAL PCR SCREEN
MRSA, PCR: NEGATIVE
Staphylococcus aureus: POSITIVE — AB

## 2022-08-02 NOTE — Progress Notes (Addendum)
PCP - Dr. Duncan Dull Cardiologist - Dr. Odis Luster  PPM/ICD - Denies  Chest x-ray - na EKG - 07/26/2022 Stress Test -  ECHO - 10/04/2020 Cardiac Cath - 08/27/2015  Sleep Study -  Diagnosed with sleep apnea CPAP - Machine is broke and does not wear it.   Type II Diabetic Fasting Blood Sugar - 100-120 Checks Blood Sugar twice times a day   Last dose of GLP1 agonist-  Denies GLP1 instructions: n/a  Blood Thinner Instructions: Denies Aspirin Instructions: Denies  ERAS Protcol - yes PRE-SURGERY Ensure or G2- G2  COVID TEST- Denies   Anesthesia review: Yes. HTN, DM, murmer  Patient denies shortness of breath, fever, cough and chest pain at PAT appointment   All instructions explained to the patient, with a verbal understanding of the material. Patient agrees to go over the instructions while at home for a better understanding. Patient also instructed to self quarantine after being tested for COVID-19. The opportunity to ask questions was provided.

## 2022-08-02 NOTE — Pre-Procedure Instructions (Signed)
Surgical Instructions    Your procedure is scheduled on Thursday, April 18th.  Report to [facility] Main Entrance "A" at 05:30 A.M., then check in with the Admitting office.  Call this number if you have problems the morning of surgery:  [phone]  If you have any questions prior to your surgery date call [phone]: Open Monday-Friday 8am-4pm If you experience any cold or flu symptoms such as cough, fever, chills, shortness of breath, etc. between now and your scheduled surgery, please notify us at the above number.     Remember:  Do not eat after midnight the night before your surgery  You may drink clear liquids until 04:30 AM the morning of your surgery.   Clear liquids allowed are: Water, Non-Citrus Juices (without pulp), Carbonated Beverages, Clear Tea, Black Coffee Only (NO MILK, CREAM OR POWDERED CREAMER of any kind), and Gatorade.    Take these medicines the morning of surgery with A SIP OF WATER  atorvastatin (LIPITOR)  buPROPion (WELLBUTRIN XL)  busPIRone (BUSPAR)  carvedilol (COREG)  dicyclomine (BENTYL)  ezetimibe (ZETIA)  gabapentin (NEURONTIN)  isosorbide mononitrate (IMDUR)  levothyroxine (SYNTHROID)  omeprazole (PRILOSEC)   If needed: clonazePAM (KLONOPIN)  HYDROcodone-acetaminophen (NORCO)  methocarbamol (ROBAXIN)  promethazine (PHENERGAN)    As of today, STOP taking any Aspirin (unless otherwise instructed by your surgeon) Aleve, Naproxen, Ibuprofen, Motrin, Advil, Goody's, BC's, all herbal medications, fish oil, and all vitamins.   WHAT DO I DO ABOUT MY DIABETES MEDICATION?   Do not take metFORMIN (GLUCOPHAGE) the morning of surgery.     HOW TO MANAGE YOUR DIABETES BEFORE AND AFTER SURGERY  Why is it important to control my blood sugar before and after surgery? Improving blood sugar levels before and after surgery helps healing and can limit problems. A way of improving blood sugar control is eating a healthy diet by:  Eating less sugar  and carbohydrates  Increasing activity/exercise  Talking with your doctor about reaching your blood sugar goals High blood sugars (greater than 180 mg/dL) can raise your risk of infections and slow your recovery, so you will need to focus on controlling your diabetes during the weeks before surgery. Make sure that the doctor who takes care of your diabetes knows about your planned surgery including the date and location.  How do I manage my blood sugar before surgery? Check your blood sugar at least 4 times a day, starting 2 days before surgery, to make sure that the level is not too high or low.  Check your blood sugar the morning of your surgery when you wake up and every 2 hours until you get to the Short Stay unit.  If your blood sugar is less than 70 mg/dL, you will need to treat for low blood sugar: Do not take insulin. Treat a low blood sugar (less than 70 mg/dL) with  cup of clear juice (cranberry or apple), 4 glucose tablets, OR glucose gel. Recheck blood sugar in 15 minutes after treatment (to make sure it is greater than 70 mg/dL). If your blood sugar is not greater than 70 mg/dL on recheck, call [phone] for further instructions. Report your blood sugar to the short stay nurse when you get to Short Stay.  If you are admitted to the hospital after surgery: Your blood sugar will be checked by the staff and you will probably be given insulin after surgery (instead of oral diabetes medicines) to make sure you have good blood sugar levels. The goal for blood sugar control after  surgery is 80-180 mg/dL.                    Do NOT Smoke (Tobacco/Vaping) for 24 hours prior to your procedure.  If you use a CPAP at night, you may bring your mask/headgear for your overnight stay.   Contacts, glasses, piercing's, hearing aid's, dentures or partials may not be worn into surgery, please bring cases for these belongings.    For patients admitted to the hospital, discharge time will be  determined by your treatment team.   Patients discharged the day of surgery will not be allowed to drive home, and someone needs to stay with them for 24 hours.  SURGICAL WAITING ROOM VISITATION Patients having surgery or a procedure may have no more than 2 support people in the waiting area - these visitors may rotate.   Children under the age of 19 must have an adult with them who is not the patient. If the patient needs to stay at the hospital during part of their recovery, the visitor guidelines for inpatient rooms apply. Pre-op nurse will coordinate an appropriate time for 1 support person to accompany patient in pre-op.  This support person may not rotate.   Please refer to the [facility] website for the visitor guidelines for Inpatients (after your surgery is over and you are in a regular room).    Special instructions:    Oral Hygiene is also important to reduce your risk of infection.  Remember - BRUSH YOUR TEETH THE MORNING OF SURGERY WITH YOUR REGULAR TOOTHPASTE  [place]- Preparing for Total Shoulder Arthroplasty  Before surgery, you can play an important role. Because skin is not sterile, your skin needs to be as free of germs as possible. You can reduce the number of germs on your skin by using the following products.   Benzoyl Peroxide Gel  o Reduces the number of germs present on the skin  o Applied twice a day to shoulder area starting two days before surgery   Chlorhexidine Gluconate (CHG) Soap (instructions listed above on how to wash with CHG Soap)  o An antiseptic cleaner that kills germs and bonds with the skin to continue killing germs even after washing  o Used for showering the night before surgery and morning of surgery   ==================================================================  Please follow these instructions carefully:  BENZOYL PEROXIDE 5% GEL  Please do not use if you have an allergy to benzoyl peroxide. If your skin becomes  reddened/irritated stop using the benzoyl peroxide.  Starting two days before surgery, apply as follows:  1. Apply benzoyl peroxide in the morning and at night. Apply after taking a shower. If you are not taking a shower clean entire shoulder front, back, and side along with the armpit with a clean wet washcloth.  2. Place a quarter-sized dollop on your SHOULDER and rub in thoroughly, making sure to cover the front, back, and side of your shoulder, along with the armpit.   2 Days prior to Surgery First Dose on _____________ Morning Second Dose on ______________ Night  Day Before Surgery First Dose on ______________ Morning Night before surgery wash (entire body except face and private areas) with CHG Soap THEN Second Dose on ____________ Night   Morning of Surgery  wash BODY AGAIN with CHG Soap   4. Do NOT apply benzoyl peroxide gel on the day of surgery   [place]- Preparing For Surgery  Before surgery, you can play an important role. Because skin is  not sterile, your skin needs to be as free of germs as possible. You can reduce the number of germs on your skin by washing with CHG (chlorahexidine gluconate) Soap before surgery.  CHG is an antiseptic cleaner which kills germs and bonds with the skin to continue killing germs even after washing.     Please do not use if you have an allergy to CHG or antibacterial soaps. If your skin becomes reddened/irritated stop using the CHG.  Do not shave (including legs and underarms) for at least 48 hours prior to first CHG shower. It is OK to shave your face.  Please follow these instructions carefully.     Shower the NIGHT BEFORE SURGERY and the MORNING OF SURGERY with CHG Soap.   If you chose to wash your hair, wash your hair first as usual with your normal shampoo. After you shampoo, rinse your hair and body thoroughly to remove the shampoo.  Then [employer] and genitals (private parts) with your normal soap and rinse thoroughly to  remove soap.  After that Use CHG Soap as you would any other liquid soap. You can apply CHG directly to the skin and wash gently with a scrungie or a clean washcloth.   Apply the CHG Soap to your body ONLY FROM THE NECK DOWN.  Do not use on open wounds or open sores. Avoid contact with your eyes, ears, mouth and genitals (private parts). Wash Face and genitals (private parts)  with your normal soap.   Wash thoroughly, paying special attention to the area where your surgery will be performed.  Thoroughly rinse your body with warm water from the neck down.  DO NOT shower/wash with your normal soap after using and rinsing off the CHG Soap.  Pat yourself dry with a CLEAN TOWEL.  8. Apply the Benzoyl Peroxide only the night before surgery.  Do Not use it the morning of surgery.  Wear CLEAN PAJAMAS to bed the night before surgery  Place CLEAN SHEETS on your bed the night before your surgery   Day of Surgery: Take a shower with CHG soap. Do not wear jewelry  Do not wear lotions, powders, colognes, or deodorant. Men may shave face and neck. Do not bring valuables to the hospital. [facility] is not responsible for any belongings or valuables.  Wear Clean/Comfortable clothing the morning of surgery Remember to brush your teeth WITH YOUR REGULAR TOOTHPASTE.   Please read over the following fact sheets that you were given.    If you received a COVID test during your pre-op visit  it is requested that you wear a mask when out in public, stay away from anyone that may not be feeling well and notify your surgeon if you develop symptoms. If you have been in contact with anyone that has tested positive in the last 10 days please notify you surgeon.

## 2022-08-02 NOTE — Progress Notes (Signed)
   08/02/2022  Patient ID: [name], male   DOB: 09/28/1965, 57 y.o.   MRN: [id number]  Subjective/Objective: Telephone follow-up to verify that patient received Jardiance through PAP and discuss medication management related to other chronic disease states. DM -Patient received 90 days of Jardiance from [facility] around the end of February (added to medication list) -Continues taking metformin 500mg  BID -Tolerating medications well with no adverse effects -Checking FBG regularly, and states they range 110-120 -A1c 7.1 on 4/3 -Value at pre-operative appointment today was 187 and was taken approximately 40 minutes after eating lunch -Does not endorse any s/sx of hypo/hyperglycemia  HTN -Patient does not check BP at home, but plans to purchase monitor when he receives income tax return -BP at PCP visit 4/3 was 128/72; today at pre-operative appointment it was 154/86, but the patient endorses being in a large amount of pain and feeling nervous -Currently taking carvedilol 12.5mg  BID, furosemide 20mg  twice weekly, isosorbide [place] ER 30mg  daily, and losartan 100mg  daily  HLD -LDL 129 and TG 369 on 4/3 lipid panel -Currently taking atorvastatin 80mg  daily and ezetimibe 10mg  daily  Assessment/Plan:  DM -Well controlled -Continue current medication regimen, regular monitoring/recording of FBG, and routine follow-up with PCP -Recommend A1c and CMP and 10/25/22 PCP appointment; if A1c increases, can always increase Jardiance to 25mg  daily -Educated patient on how to request refill on Jardiance if not sent automatically  HTN -Mostly well controlled -Continue current medication regimen, begin checking home BP when able to obtain monitor, and follow-up with PCP regularly  HLD -Discussed potentially adding on PCSK9-I in an effort to further decrease LDL and TG; patient seems open to this -Informed that if expense is high, I can likely help with insurance coverage or resources -Forwarding note to  PCP to inform  Follow-up: Nothing scheduled at this time but can follow up as needed per Dr. Kara Mead, PharmD, DPLA

## 2022-08-03 ENCOUNTER — Telehealth: Payer: Self-pay | Admitting: Orthopedic Surgery

## 2022-08-03 NOTE — Telephone Encounter (Signed)
Patient called in stating he is scheduled to get a B12 shot on the 16th and was wondering if that is okay to do before surgery or does he need to reschedule that until after surgery

## 2022-08-03 NOTE — Anesthesia Preprocedure Evaluation (Addendum)
Anesthesia Evaluation  Patient identified by MRN, date of birth, ID band Patient awake    Reviewed: Allergy & Precautions, NPO status , Patient's Chart, lab work & pertinent test results  Airway Mallampati: II  TM Distance: >3 FB Neck ROM: Full    Dental  (+) Teeth Intact, Dental Advisory Given   Pulmonary sleep apnea and Continuous Positive Airway Pressure Ventilation , former smoker   breath sounds clear to auscultation       Cardiovascular hypertension, + CAD   Rhythm:Regular Rate:Normal     Neuro/Psych  PSYCHIATRIC DISORDERS Anxiety Depression     Neuromuscular disease    GI/Hepatic Neg liver ROS,GERD  ,,  Endo/Other  diabetesHypothyroidism    Renal/GU negative Renal ROS     Musculoskeletal  (+) Arthritis ,    Abdominal   Peds  Hematology   Anesthesia Other Findings   Reproductive/Obstetrics                             Anesthesia Physical Anesthesia Plan  ASA: 2  Anesthesia Plan: General   Post-op Pain Management: Regional block*   Induction:   PONV Risk Score and Plan: 3 and Ondansetron, Dexamethasone and Midazolam  Airway Management Planned: Oral ETT  Additional Equipment: None  Intra-op Plan:   Post-operative Plan: Extubation in OR  Informed Consent: I have reviewed the patients History and Physical, chart, labs and discussed the procedure including the risks, benefits and alternatives for the proposed anesthesia with the patient or authorized representative who has indicated his/her understanding and acceptance.     Dental advisory given  Plan Discussed with: CRNA  Anesthesia Plan Comments: (See APP note by Joslyn Hy, FNP )       Anesthesia Quick Evaluation

## 2022-08-03 NOTE — Progress Notes (Signed)
Anesthesia Chart Review:   Case: [id number] Date/Time: 08/10/22 0715   Procedure: RIGHT REVERSE SHOULDER ARTHROPLASTY, BICEPS TENODESIS (Right: Shoulder)   Anesthesia type: General   Pre-op diagnosis: right shoulder rotator cuff arthropathy   [facility] ROOM 06 / [facility] OR   Surgeons: Cammy Copa, MD       DISCUSSION: Pt is 57 years old with hx CAD (mild nonobstructive by 2017 cath), heart murmur (trivial valve regurgitation on 2022 echo), palpitations, HTN, DM, OSA, fatty liver  VS: Pulse 79   Temp 36.7 C (Oral)   Resp 18   Ht 6' (1.829 m)   Wt 105.1 kg   BMI 31.44 kg/m   PROVIDERS: - PCP is  [name], MD. Last office visit 07/26/22 - Cardiologist is Julien Nordmann, MD. Last office visit 04/25/22  LABS: Labs reviewed: Acceptable for surgery. (all labs ordered are listed, but only abnormal results are displayed)  Labs Reviewed  SURGICAL PCR SCREEN - Abnormal; Notable for the following components:      Result Value   Staphylococcus aureus POSITIVE (*)    All other components within normal limits  GLUCOSE, CAPILLARY - Abnormal; Notable for the following components:   Glucose-Capillary 187 (*)    All other components within normal limits  BASIC METABOLIC PANEL - Abnormal; Notable for the following components:   Glucose, Bld 160 (*)    Calcium 8.7 (*)    All other components within normal limits  URINALYSIS, ROUTINE W REFLEX MICROSCOPIC - Abnormal; Notable for the following components:   Glucose, UA >=500 (*)    All other components within normal limits  CBC     IMAGES: CXR 02/01/22:  - No active cardiopulmonary disease.   EKG 07/26/22:  Sinus Rhythm -frequent ectopic ventricular beats; # VECs = 2 -Left atrial enlargement. -Right sided conduction defect and right axis -possible right ventricular hypertrophy or posterior fascicular block. -Old inferior-apical infarct.   CV: Echo 10/04/20:  1. Left ventricular ejection fraction, by estimation, is 65 to  70%. The left ventricle has normal function. The left ventricle has no regional wall motion abnormalities. Left ventricular diastolic parameters are consistent with Grade II diastolic dysfunction (pseudonormalization).  2. Right ventricular systolic function is normal. The right ventricular size is normal. There is normal pulmonary artery systolic pressure.  3. Right atrial size was mildly dilated.  4. The mitral valve is normal in structure. Trivial mitral valve regurgitation.  5. The aortic valve is normal in structure. Aortic valve regurgitation is trivial.   CT coronary morphology 02/17/19 1. Coronary artery calcium score 50 Agatston units. This places the patient in the 82nd percentile for age and gender, suggesting high risk for future cardiac events. 2.  Nonobstructive disease in the LAD and RCA systems.  3. Misregistration artifact makes interpretation of the mid LCx difficult. I suspect mild stenosis in the mid LCx. Due to misregistration, I do not think that FFR would be successful on this study.  Cardiac cath 08/27/15:  1. Minimal CAD with 30% mid LCX lesion.    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   Anxiety    Anxiety and depression    Arthritis    knees and shoulders   Benzodiazepine withdrawal with complication 01/09/2019   Bruit    L   Chest pain    hx   Cognitive complaints with normal neuropsychological exam 10/23/2016   Diabetes mellitus  without complication    "borderline", diet controlled, no meds, patient has lost 30 lbs   Edema    Fatty liver    GERD (gastroesophageal reflux disease)    uses Omeprazole   Goiter    HLD (hyperlipidemia)    Hypertension    essential, benign   Hypothyroidism    Impotence of organic origin    Murmur    never has caused any problems   Neuromuscular disorder    neuropathy bilateral feet   Other chest pain    tightness, pressure    Palpitation    hx   Precordial pain    Sleep apnea    uses cpap    Past Surgical History:  Procedure Laterality Date   [facility]   CARDIAC CATHETERIZATION     CARDIAC CATHETERIZATION N/A 08/27/2015   Procedure: Left Heart Cath and Coronary Angiography;  Surgeon: Dolores Pattyaniel R Bensimhon, MD;  [facility];  Service: Cardiovascular;  Laterality: N/A;   COLONOSCOPY  in his 20's   COLONOSCOPY  2019   EAR CYST EXCISION Left 07/08/2019   Procedure: open excision baker's cyst left knee;  Surgeon: Cammy Copaean, Gregory Scott, MD;  [facility];  Service: Orthopedics;  Laterality: Left;   HERNIA REPAIR  05/15/2013   umbilical   JOINT REPLACEMENT     right knee   KNEE ARTHROSCOPY Left 12/17/2018   Procedure: left knee arthroscopy, meniscal debridement, loose body removal;  Surgeon: Cammy Copaean, Gregory Scott, MD;  [facility];  Service: Orthopedics;  Laterality: Left;   REVISION TOTAL KNEE ARTHROPLASTY Right 06/23/2014   DR August SaucerEAN   SHOULDER ARTHROSCOPY WITH ROTATOR CUFF REPAIR AND SUBACROMIAL DECOMPRESSION Right 03/07/2022   Procedure: RIGHT SHOULDER ARTHROSCOPY, DEBRIDEMENT, MINI OPEN ROTATOR CUFF TEAR REPAIR;  Surgeon: Cammy Copaean, Gregory Scott, MD;  Location[facility];  Service: Orthopedics;  Laterality: Right;   TONSILLECTOMY     TOTAL KNEE ARTHROPLASTY Right 2011   right   TOTAL KNEE ARTHROPLASTY Left 02/10/2021   Procedure: LEFT TOTAL KNEE ARTHROPLASTY;  Surgeon: Cammy Copaean, Gregory Scott, MD;  [facility];  Service: Orthopedics;  Laterality: Left;   TOTAL KNEE REVISION Right 06/23/2014   Procedure: TOTAL KNEE REVISION;  Surgeon: Cammy CopaGregory Scott Dean, MD;  [facility];  Service: Orthopedics;  Laterality: Right;   UPPER GASTROINTESTINAL ENDOSCOPY  04/30/2013   Demetrius CharityPatterson    WISDOM TOOTH EXTRACTION      MEDICATIONS:  ARIPiprazole (ABILIFY) 5 MG tablet   atorvastatin (LIPITOR) 80 MG tablet   Blood Glucose Monitoring Suppl (ACCU-CHEK AVIVA PLUS) w/Device KIT    buPROPion (WELLBUTRIN XL) 300 MG 24 hr tablet   busPIRone (BUSPAR) 15 MG tablet   carvedilol (COREG) 12.5 MG tablet   clonazePAM (KLONOPIN) 2 MG tablet   dicyclomine (BENTYL) 20 MG tablet   empagliflozin (JARDIANCE) 10 MG TABS tablet   ezetimibe (ZETIA) 10 MG tablet   furosemide (LASIX) 20 MG tablet   gabapentin (NEURONTIN) 300 MG capsule   glucose blood (ACCU-CHEK AVIVA PLUS) test strip   HYDROcodone-acetaminophen (NORCO) 10-325 MG tablet   ibuprofen (ADVIL) 200 MG tablet   isosorbide mononitrate (IMDUR) 30 MG 24 hr tablet   Lancets (ACCU-CHEK MULTICLIX) lancets   levothyroxine (SYNTHROID) 88 MCG tablet   losartan (COZAAR) 100 MG tablet   metFORMIN (GLUCOPHAGE) 500 MG tablet   methocarbamol (ROBAXIN) 500 MG tablet   Multiple Vitamin (MULTIVITAMIN WITH MINERALS) TABS tablet   naloxone (NARCAN) nasal spray 4 mg/0.1 mL  omeprazole (PRILOSEC) 40 MG capsule   promethazine (PHENERGAN) 12.5 MG tablet   traZODone (DESYREL) 100 MG tablet   No current facility-administered medications for this encounter.    If no changes, I anticipate pt can proceed with surgery as scheduled.   Rica Mast, PhD, FNP-BC [facility] Stay [facility]/Anesthesiology Phone: [phone] 08/03/2022 4:08 PM

## 2022-08-07 NOTE — Telephone Encounter (Signed)
IC advised.  

## 2022-08-07 NOTE — Telephone Encounter (Signed)
I do not see any reason why not

## 2022-08-08 ENCOUNTER — Ambulatory Visit: Payer: Medicare HMO

## 2022-08-09 ENCOUNTER — Telehealth: Payer: Self-pay | Admitting: *Deleted

## 2022-08-09 NOTE — Care Plan (Signed)
[facility] RNCM call to patient to discuss his upcoming Right Reverse Shoulder Arthroplasty with Dr. [name] on 08/10/22. He is an Ortho bundle patient through [facility] and is agreeable to case management. He has a son that can assist once at home. He received his sling/ROM device from [employer] today. Reviewed post op instructions. Will continue to follow for needs. SANE (Single assessment numeric evaluation) asked of patient, who feels he is at 40% out of 100% prior to surgery.

## 2022-08-09 NOTE — Telephone Encounter (Signed)
Ortho bundle pre-op call completed. 

## 2022-08-10 ENCOUNTER — Ambulatory Visit (HOSPITAL_BASED_OUTPATIENT_CLINIC_OR_DEPARTMENT_OTHER): Payer: Medicare HMO | Admitting: Certified Registered"

## 2022-08-10 ENCOUNTER — Ambulatory Visit (HOSPITAL_COMMUNITY): Payer: Medicare HMO | Admitting: Vascular Surgery

## 2022-08-10 ENCOUNTER — Inpatient Hospital Stay (HOSPITAL_COMMUNITY)
Admission: AD | Admit: 2022-08-10 | Discharge: 2022-08-16 | DRG: 483 | Discharge status: Against Medical Advice | Payer: Medicare HMO | Attending: Orthopedic Surgery | Admitting: Orthopedic Surgery

## 2022-08-10 ENCOUNTER — Other Ambulatory Visit: Payer: Self-pay

## 2022-08-10 ENCOUNTER — Encounter (HOSPITAL_COMMUNITY)
Admission: AD | Discharge status: Against Medical Advice | Payer: Self-pay | Source: Home / Self Care | Attending: Orthopedic Surgery

## 2022-08-10 ENCOUNTER — Observation Stay (HOSPITAL_COMMUNITY): Payer: Medicare HMO

## 2022-08-10 ENCOUNTER — Encounter (HOSPITAL_COMMUNITY): Payer: Self-pay | Admitting: Orthopedic Surgery

## 2022-08-10 DIAGNOSIS — Z79890 Hormone replacement therapy (postmenopausal): Secondary | ICD-10-CM

## 2022-08-10 DIAGNOSIS — G8918 Other acute postprocedural pain: Secondary | ICD-10-CM | POA: Diagnosis not present

## 2022-08-10 DIAGNOSIS — M19011 Primary osteoarthritis, right shoulder: Secondary | ICD-10-CM | POA: Diagnosis present

## 2022-08-10 DIAGNOSIS — I10 Essential (primary) hypertension: Secondary | ICD-10-CM | POA: Diagnosis present

## 2022-08-10 DIAGNOSIS — M75101 Unspecified rotator cuff tear or rupture of right shoulder, not specified as traumatic: Principal | ICD-10-CM | POA: Diagnosis present

## 2022-08-10 DIAGNOSIS — F10139 Alcohol abuse with withdrawal, unspecified: Secondary | ICD-10-CM | POA: Diagnosis not present

## 2022-08-10 DIAGNOSIS — E1140 Type 2 diabetes mellitus with diabetic neuropathy, unspecified: Secondary | ICD-10-CM

## 2022-08-10 DIAGNOSIS — E11649 Type 2 diabetes mellitus with hypoglycemia without coma: Secondary | ICD-10-CM | POA: Diagnosis not present

## 2022-08-10 DIAGNOSIS — F419 Anxiety disorder, unspecified: Secondary | ICD-10-CM | POA: Diagnosis present

## 2022-08-10 DIAGNOSIS — E039 Hypothyroidism, unspecified: Secondary | ICD-10-CM | POA: Diagnosis present

## 2022-08-10 DIAGNOSIS — I160 Hypertensive urgency: Secondary | ICD-10-CM | POA: Diagnosis not present

## 2022-08-10 DIAGNOSIS — E871 Hypo-osmolality and hyponatremia: Secondary | ICD-10-CM | POA: Diagnosis not present

## 2022-08-10 DIAGNOSIS — M12811 Other specific arthropathies, not elsewhere classified, right shoulder: Secondary | ICD-10-CM | POA: Diagnosis not present

## 2022-08-10 DIAGNOSIS — D696 Thrombocytopenia, unspecified: Secondary | ICD-10-CM | POA: Diagnosis present

## 2022-08-10 DIAGNOSIS — E876 Hypokalemia: Secondary | ICD-10-CM | POA: Diagnosis present

## 2022-08-10 DIAGNOSIS — Z79899 Other long term (current) drug therapy: Secondary | ICD-10-CM

## 2022-08-10 DIAGNOSIS — Z471 Aftercare following joint replacement surgery: Secondary | ICD-10-CM | POA: Diagnosis not present

## 2022-08-10 DIAGNOSIS — R4182 Altered mental status, unspecified: Secondary | ICD-10-CM | POA: Diagnosis not present

## 2022-08-10 DIAGNOSIS — Z7189 Other specified counseling: Secondary | ICD-10-CM

## 2022-08-10 DIAGNOSIS — Z96611 Presence of right artificial shoulder joint: Secondary | ICD-10-CM | POA: Diagnosis not present

## 2022-08-10 DIAGNOSIS — Z87891 Personal history of nicotine dependence: Secondary | ICD-10-CM | POA: Diagnosis not present

## 2022-08-10 DIAGNOSIS — J9811 Atelectasis: Secondary | ICD-10-CM | POA: Diagnosis present

## 2022-08-10 DIAGNOSIS — I959 Hypotension, unspecified: Secondary | ICD-10-CM | POA: Diagnosis not present

## 2022-08-10 DIAGNOSIS — F10931 Alcohol use, unspecified with withdrawal delirium: Secondary | ICD-10-CM | POA: Insufficient documentation

## 2022-08-10 DIAGNOSIS — D649 Anemia, unspecified: Secondary | ICD-10-CM | POA: Diagnosis present

## 2022-08-10 DIAGNOSIS — E785 Hyperlipidemia, unspecified: Secondary | ICD-10-CM | POA: Diagnosis present

## 2022-08-10 DIAGNOSIS — Z8249 Family history of ischemic heart disease and other diseases of the circulatory system: Secondary | ICD-10-CM | POA: Diagnosis not present

## 2022-08-10 DIAGNOSIS — Z96653 Presence of artificial knee joint, bilateral: Secondary | ICD-10-CM | POA: Diagnosis present

## 2022-08-10 DIAGNOSIS — G9341 Metabolic encephalopathy: Secondary | ICD-10-CM | POA: Diagnosis not present

## 2022-08-10 DIAGNOSIS — Z7984 Long term (current) use of oral hypoglycemic drugs: Secondary | ICD-10-CM

## 2022-08-10 DIAGNOSIS — Z888 Allergy status to other drugs, medicaments and biological substances status: Secondary | ICD-10-CM

## 2022-08-10 DIAGNOSIS — F32A Depression, unspecified: Secondary | ICD-10-CM | POA: Diagnosis present

## 2022-08-10 DIAGNOSIS — E1142 Type 2 diabetes mellitus with diabetic polyneuropathy: Secondary | ICD-10-CM | POA: Diagnosis present

## 2022-08-10 DIAGNOSIS — M25511 Pain in right shoulder: Secondary | ICD-10-CM | POA: Diagnosis present

## 2022-08-10 DIAGNOSIS — Z01818 Encounter for other preprocedural examination: Principal | ICD-10-CM

## 2022-08-10 DIAGNOSIS — G4733 Obstructive sleep apnea (adult) (pediatric): Secondary | ICD-10-CM | POA: Diagnosis present

## 2022-08-10 DIAGNOSIS — K219 Gastro-esophageal reflux disease without esophagitis: Secondary | ICD-10-CM | POA: Diagnosis present

## 2022-08-10 DIAGNOSIS — R4781 Slurred speech: Secondary | ICD-10-CM | POA: Diagnosis not present

## 2022-08-10 DIAGNOSIS — Z969 Presence of functional implant, unspecified: Secondary | ICD-10-CM

## 2022-08-10 HISTORY — PX: REVERSE SHOULDER ARTHROPLASTY: SHX5054

## 2022-08-10 LAB — GLUCOSE, CAPILLARY
Glucose-Capillary: 119 mg/dL — ABNORMAL HIGH (ref 70–99)
Glucose-Capillary: 106 mg/dL — ABNORMAL HIGH (ref 70–99)
Glucose-Capillary: 130 mg/dL — ABNORMAL HIGH (ref 70–99)
Glucose-Capillary: 119 mg/dL — ABNORMAL HIGH (ref 70–99)

## 2022-08-10 SURGERY — ARTHROPLASTY, SHOULDER, TOTAL, REVERSE
Anesthesia: Regional | Site: Shoulder | Laterality: Right

## 2022-08-10 MED ORDER — EPHEDRINE 5 MG/ML INJ
INTRAVENOUS | Status: AC
  Filled 2022-08-10: qty 5

## 2022-08-10 MED ORDER — OXYCODONE HCL 5 MG/5ML PO SOLN: 5.0000 mg | Freq: Once | ORAL | Status: DC | PRN

## 2022-08-10 MED ORDER — ACETAMINOPHEN 325 MG PO TABS: 325.0000 mg | ORAL_TABLET | ORAL | Status: DC | PRN

## 2022-08-10 MED ORDER — LIDOCAINE 2% (20 MG/ML) 5 ML SYRINGE
INTRAMUSCULAR | Status: AC
  Filled 2022-08-10: qty 5

## 2022-08-10 MED ORDER — ONDANSETRON HCL 4 MG/2ML IJ SOLN
INTRAMUSCULAR | Status: DC | PRN
  Administered 2022-08-10: 4 mg via INTRAVENOUS

## 2022-08-10 MED ORDER — LOSARTAN POTASSIUM 50 MG PO TABS
100.0000 mg | ORAL_TABLET | Freq: Every day | ORAL | Status: DC
  Administered 2022-08-11 – 2022-08-16 (×6): 100 mg via ORAL
  Filled 2022-08-10 (×6): qty 2

## 2022-08-10 MED ORDER — ASPIRIN 81 MG PO TBEC
81.0000 mg | DELAYED_RELEASE_TABLET | Freq: Every day | ORAL | Status: DC
  Administered 2022-08-10 – 2022-08-15 (×6): 81 mg via ORAL
  Filled 2022-08-10 (×6): qty 1

## 2022-08-10 MED ORDER — METOCLOPRAMIDE HCL 5 MG/ML IJ SOLN: 5.0000 mg | Freq: Three times a day (TID) | INTRAMUSCULAR | Status: DC | PRN

## 2022-08-10 MED ORDER — MIDAZOLAM HCL 2 MG/2ML IJ SOLN
INTRAMUSCULAR | Status: AC
  Filled 2022-08-10: qty 2

## 2022-08-10 MED ORDER — CLONAZEPAM 1 MG PO TABS
1.0000 mg | ORAL_TABLET | Freq: Two times a day (BID) | ORAL | Status: DC | PRN
  Administered 2022-08-11: 1 mg via ORAL
  Filled 2022-08-10: qty 1

## 2022-08-10 MED ORDER — ONDANSETRON HCL 4 MG/2ML IJ SOLN: 4.0000 mg | Freq: Four times a day (QID) | INTRAMUSCULAR | Status: DC | PRN

## 2022-08-10 MED ORDER — EZETIMIBE 10 MG PO TABS
10.0000 mg | ORAL_TABLET | Freq: Every day | ORAL | Status: DC
  Administered 2022-08-11 – 2022-08-16 (×6): 10 mg via ORAL
  Filled 2022-08-10 (×6): qty 1

## 2022-08-10 MED ORDER — FENTANYL CITRATE (PF) 250 MCG/5ML IJ SOLN
INTRAMUSCULAR | Status: DC | PRN
  Administered 2022-08-10: 100 ug via INTRAVENOUS

## 2022-08-10 MED ORDER — CHLORHEXIDINE GLUCONATE 0.12 % MT SOLN
15.0000 mL | Freq: Once | OROMUCOSAL | Status: AC
  Administered 2022-08-10: 15 mL via OROMUCOSAL
  Filled 2022-08-10: qty 15

## 2022-08-10 MED ORDER — LACTATED RINGERS IV SOLN: INTRAVENOUS | Status: DC

## 2022-08-10 MED ORDER — ACETAMINOPHEN 325 MG PO TABS
325.0000 mg | ORAL_TABLET | Freq: Four times a day (QID) | ORAL | Status: DC | PRN
  Administered 2022-08-11 – 2022-08-14 (×3): 650 mg via ORAL
  Filled 2022-08-10 (×3): qty 2

## 2022-08-10 MED ORDER — PROPOFOL 10 MG/ML IV BOLUS
INTRAVENOUS | Status: AC
  Filled 2022-08-10: qty 20

## 2022-08-10 MED ORDER — LEVOTHYROXINE SODIUM 88 MCG PO TABS
88.0000 ug | ORAL_TABLET | Freq: Every day | ORAL | Status: DC
  Administered 2022-08-11 – 2022-08-16 (×6): 88 ug via ORAL
  Filled 2022-08-10 (×6): qty 1

## 2022-08-10 MED ORDER — FENTANYL CITRATE (PF) 100 MCG/2ML IJ SOLN
INTRAMUSCULAR | Status: AC
  Filled 2022-08-10: qty 2

## 2022-08-10 MED ORDER — ROCURONIUM BROMIDE 10 MG/ML (PF) SYRINGE
PREFILLED_SYRINGE | INTRAVENOUS | Status: AC
  Filled 2022-08-10: qty 10

## 2022-08-10 MED ORDER — INSULIN ASPART 100 UNIT/ML IJ SOLN: 0.0000 [IU] | Freq: Every day | INTRAMUSCULAR | Status: DC

## 2022-08-10 MED ORDER — ACETAMINOPHEN 500 MG PO TABS
1000.0000 mg | ORAL_TABLET | Freq: Four times a day (QID) | ORAL | Status: AC
  Administered 2022-08-10 – 2022-08-11 (×3): 1000 mg via ORAL
  Filled 2022-08-10 (×3): qty 2

## 2022-08-10 MED ORDER — VANCOMYCIN HCL 1000 MG IV SOLR
INTRAVENOUS | Status: DC | PRN
  Administered 2022-08-10: 2000 mg via TOPICAL

## 2022-08-10 MED ORDER — CARVEDILOL 12.5 MG PO TABS
12.5000 mg | ORAL_TABLET | Freq: Two times a day (BID) | ORAL | Status: DC
  Administered 2022-08-10 – 2022-08-13 (×7): 12.5 mg via ORAL
  Filled 2022-08-10 (×7): qty 1

## 2022-08-10 MED ORDER — INSULIN ASPART 100 UNIT/ML IJ SOLN: 0.0000 [IU] | INTRAMUSCULAR | Status: DC | PRN

## 2022-08-10 MED ORDER — AMISULPRIDE (ANTIEMETIC) 5 MG/2ML IV SOLN: 10.0000 mg | Freq: Once | INTRAVENOUS | Status: DC | PRN

## 2022-08-10 MED ORDER — LIDOCAINE 2% (20 MG/ML) 5 ML SYRINGE
INTRAMUSCULAR | Status: DC | PRN
  Administered 2022-08-10: 60 mg via INTRAVENOUS

## 2022-08-10 MED ORDER — FUROSEMIDE 20 MG PO TABS
20.0000 mg | ORAL_TABLET | ORAL | Status: DC
  Administered 2022-08-11 – 2022-08-14 (×2): 20 mg via ORAL
  Filled 2022-08-10 (×3): qty 1

## 2022-08-10 MED ORDER — INSULIN ASPART 100 UNIT/ML IJ SOLN
0.0000 [IU] | Freq: Three times a day (TID) | INTRAMUSCULAR | Status: DC
  Administered 2022-08-10 – 2022-08-12 (×4): 2 [IU] via SUBCUTANEOUS

## 2022-08-10 MED ORDER — MENTHOL 3 MG MT LOZG: 1.0000 | LOZENGE | OROMUCOSAL | Status: DC | PRN

## 2022-08-10 MED ORDER — OXYCODONE HCL 5 MG PO TABS: 5.0000 mg | ORAL_TABLET | Freq: Once | ORAL | Status: DC | PRN

## 2022-08-10 MED ORDER — METHOCARBAMOL 500 MG PO TABS
500.0000 mg | ORAL_TABLET | Freq: Four times a day (QID) | ORAL | Status: DC | PRN
  Administered 2022-08-10 – 2022-08-14 (×4): 500 mg via ORAL
  Filled 2022-08-10 (×5): qty 1

## 2022-08-10 MED ORDER — ACETAMINOPHEN 160 MG/5ML PO SOLN: 325.0000 mg | ORAL | Status: DC | PRN

## 2022-08-10 MED ORDER — IRRISEPT - 450ML BOTTLE WITH 0.05% CHG IN STERILE WATER, USP 99.95% OPTIME
TOPICAL | Status: DC | PRN
  Administered 2022-08-10: 450 mL via TOPICAL

## 2022-08-10 MED ORDER — FENTANYL CITRATE (PF) 100 MCG/2ML IJ SOLN
25.0000 ug | INTRAMUSCULAR | Status: DC | PRN
  Administered 2022-08-10: 50 ug via INTRAVENOUS

## 2022-08-10 MED ORDER — DOCUSATE SODIUM 100 MG PO CAPS
100.0000 mg | ORAL_CAPSULE | Freq: Two times a day (BID) | ORAL | Status: DC
  Administered 2022-08-10 – 2022-08-16 (×12): 100 mg via ORAL
  Filled 2022-08-10 (×13): qty 1

## 2022-08-10 MED ORDER — MIDAZOLAM HCL 2 MG/2ML IJ SOLN
INTRAMUSCULAR | Status: DC | PRN
  Administered 2022-08-10: 2 mg via INTRAVENOUS

## 2022-08-10 MED ORDER — POVIDONE-IODINE 10 % EX SWAB
2.0000 | Freq: Once | CUTANEOUS | Status: AC
  Administered 2022-08-10: 2 via TOPICAL

## 2022-08-10 MED ORDER — SUGAMMADEX SODIUM 200 MG/2ML IV SOLN
INTRAVENOUS | Status: DC | PRN
  Administered 2022-08-10: 200 mg via INTRAVENOUS

## 2022-08-10 MED ORDER — ACETAMINOPHEN 10 MG/ML IV SOLN: 1000.0000 mg | Freq: Once | INTRAVENOUS | Status: DC | PRN

## 2022-08-10 MED ORDER — SODIUM CHLORIDE 0.9 % IV SOLN: INTRAVENOUS | Status: DC

## 2022-08-10 MED ORDER — BUSPIRONE HCL 5 MG PO TABS
15.0000 mg | ORAL_TABLET | Freq: Three times a day (TID) | ORAL | Status: DC
  Administered 2022-08-10 – 2022-08-16 (×17): 15 mg via ORAL
  Filled 2022-08-10 (×2): qty 1
  Filled 2022-08-10: qty 3
  Filled 2022-08-10 (×5): qty 1
  Filled 2022-08-10 (×2): qty 3
  Filled 2022-08-10: qty 1
  Filled 2022-08-10: qty 3
  Filled 2022-08-10 (×2): qty 1
  Filled 2022-08-10 (×2): qty 3
  Filled 2022-08-10 (×2): qty 1
  Filled 2022-08-10 (×7): qty 3
  Filled 2022-08-10: qty 1
  Filled 2022-08-10 (×3): qty 3
  Filled 2022-08-10: qty 1

## 2022-08-10 MED ORDER — METHOCARBAMOL 1000 MG/10ML IJ SOLN: 500.0000 mg | Freq: Four times a day (QID) | INTRAVENOUS | Status: DC | PRN

## 2022-08-10 MED ORDER — ISOSORBIDE MONONITRATE ER 30 MG PO TB24
30.0000 mg | ORAL_TABLET | Freq: Every day | ORAL | Status: DC
  Administered 2022-08-11 – 2022-08-16 (×6): 30 mg via ORAL
  Filled 2022-08-10 (×7): qty 1

## 2022-08-10 MED ORDER — BUPIVACAINE LIPOSOME 1.3 % IJ SUSP
INTRAMUSCULAR | Status: DC | PRN
  Administered 2022-08-10: 10 mL via PERINEURAL

## 2022-08-10 MED ORDER — OXYCODONE HCL 5 MG PO TABS
5.0000 mg | ORAL_TABLET | ORAL | Status: DC | PRN
  Administered 2022-08-10 – 2022-08-14 (×13): 10 mg via ORAL
  Filled 2022-08-10 (×13): qty 2

## 2022-08-10 MED ORDER — FENTANYL CITRATE (PF) 250 MCG/5ML IJ SOLN
INTRAMUSCULAR | Status: AC
  Filled 2022-08-10: qty 5

## 2022-08-10 MED ORDER — EPHEDRINE SULFATE-NACL 50-0.9 MG/10ML-% IV SOSY
PREFILLED_SYRINGE | INTRAVENOUS | Status: DC | PRN
  Administered 2022-08-10 (×2): 5 mg via INTRAVENOUS
  Administered 2022-08-10: 10 mg via INTRAVENOUS
  Administered 2022-08-10: 5 mg via INTRAVENOUS

## 2022-08-10 MED ORDER — ARIPIPRAZOLE 5 MG PO TABS
5.0000 mg | ORAL_TABLET | Freq: Every day | ORAL | Status: DC
  Administered 2022-08-10 – 2022-08-15 (×6): 5 mg via ORAL
  Filled 2022-08-10 (×6): qty 1

## 2022-08-10 MED ORDER — METOCLOPRAMIDE HCL 5 MG PO TABS: 5.0000 mg | ORAL_TABLET | Freq: Three times a day (TID) | ORAL | Status: DC | PRN

## 2022-08-10 MED ORDER — VANCOMYCIN HCL 1000 MG IV SOLR
INTRAVENOUS | Status: AC
  Filled 2022-08-10: qty 20

## 2022-08-10 MED ORDER — DICYCLOMINE HCL 20 MG PO TABS
20.0000 mg | ORAL_TABLET | Freq: Four times a day (QID) | ORAL | Status: DC
  Administered 2022-08-10 – 2022-08-16 (×23): 20 mg via ORAL
  Filled 2022-08-10 (×28): qty 1

## 2022-08-10 MED ORDER — ROCURONIUM BROMIDE 10 MG/ML (PF) SYRINGE
PREFILLED_SYRINGE | INTRAVENOUS | Status: DC | PRN
  Administered 2022-08-10: 60 mg via INTRAVENOUS

## 2022-08-10 MED ORDER — METFORMIN HCL 500 MG PO TABS
500.0000 mg | ORAL_TABLET | Freq: Two times a day (BID) | ORAL | Status: DC
  Administered 2022-08-10 – 2022-08-16 (×12): 500 mg via ORAL
  Filled 2022-08-10 (×12): qty 1

## 2022-08-10 MED ORDER — INSULIN ASPART 100 UNIT/ML IJ SOLN
4.0000 [IU] | Freq: Three times a day (TID) | INTRAMUSCULAR | Status: DC
  Administered 2022-08-10 – 2022-08-12 (×6): 4 [IU] via SUBCUTANEOUS

## 2022-08-10 MED ORDER — PHENYLEPHRINE 80 MCG/ML (10ML) SYRINGE FOR IV PUSH (FOR BLOOD PRESSURE SUPPORT)
PREFILLED_SYRINGE | INTRAVENOUS | Status: DC | PRN
  Administered 2022-08-10 (×3): 160 ug via INTRAVENOUS

## 2022-08-10 MED ORDER — BUPIVACAINE HCL (PF) 0.5 % IJ SOLN
INTRAMUSCULAR | Status: DC | PRN
  Administered 2022-08-10: 12 mL via PERINEURAL

## 2022-08-10 MED ORDER — 0.9 % SODIUM CHLORIDE (POUR BTL) OPTIME
TOPICAL | Status: DC | PRN
  Administered 2022-08-10: 5000 mL
  Administered 2022-08-10: 1000 mL

## 2022-08-10 MED ORDER — CELECOXIB 100 MG PO CAPS
100.0000 mg | ORAL_CAPSULE | Freq: Two times a day (BID) | ORAL | Status: DC
  Administered 2022-08-10 – 2022-08-16 (×12): 100 mg via ORAL
  Filled 2022-08-10 (×13): qty 1

## 2022-08-10 MED ORDER — TRAZODONE HCL 50 MG PO TABS
150.0000 mg | ORAL_TABLET | Freq: Every evening | ORAL | Status: DC | PRN
  Administered 2022-08-10 – 2022-08-14 (×3): 150 mg via ORAL
  Filled 2022-08-10: qty 3
  Filled 2022-08-10 (×3): qty 1

## 2022-08-10 MED ORDER — ONDANSETRON HCL 4 MG PO TABS: 4.0000 mg | ORAL_TABLET | Freq: Four times a day (QID) | ORAL | Status: DC | PRN

## 2022-08-10 MED ORDER — PHENOL 1.4 % MT LIQD: 1.0000 | OROMUCOSAL | Status: DC | PRN

## 2022-08-10 MED ORDER — BUPROPION HCL ER (XL) 150 MG PO TB24
300.0000 mg | ORAL_TABLET | Freq: Every day | ORAL | Status: DC
  Administered 2022-08-11 – 2022-08-16 (×6): 300 mg via ORAL
  Filled 2022-08-10 (×3): qty 1
  Filled 2022-08-10 (×2): qty 2
  Filled 2022-08-10: qty 1

## 2022-08-10 MED ORDER — PANTOPRAZOLE SODIUM 40 MG PO TBEC
80.0000 mg | DELAYED_RELEASE_TABLET | Freq: Every day | ORAL | Status: DC
  Administered 2022-08-11 – 2022-08-16 (×6): 80 mg via ORAL
  Filled 2022-08-10 (×6): qty 2

## 2022-08-10 MED ORDER — POVIDONE-IODINE 7.5 % EX SOLN: Freq: Once | CUTANEOUS | Status: DC

## 2022-08-10 MED ORDER — PHENYLEPHRINE 80 MCG/ML (10ML) SYRINGE FOR IV PUSH (FOR BLOOD PRESSURE SUPPORT)
PREFILLED_SYRINGE | INTRAVENOUS | Status: AC
  Filled 2022-08-10: qty 10

## 2022-08-10 MED ORDER — PROMETHAZINE HCL 25 MG/ML IJ SOLN: 6.2500 mg | INTRAMUSCULAR | Status: DC | PRN

## 2022-08-10 MED ORDER — ORAL CARE MOUTH RINSE: 15.0000 mL | Freq: Once | OROMUCOSAL | Status: AC

## 2022-08-10 MED ORDER — ONDANSETRON HCL 4 MG/2ML IJ SOLN
INTRAMUSCULAR | Status: AC
  Filled 2022-08-10: qty 2

## 2022-08-10 MED ORDER — PROPOFOL 10 MG/ML IV BOLUS
INTRAVENOUS | Status: DC | PRN
  Administered 2022-08-10: 160 mg via INTRAVENOUS

## 2022-08-10 MED ORDER — PHENYLEPHRINE HCL-NACL 20-0.9 MG/250ML-% IV SOLN
INTRAVENOUS | Status: DC | PRN
  Administered 2022-08-10: 30 ug/min via INTRAVENOUS

## 2022-08-10 MED ORDER — CEFAZOLIN SODIUM-DEXTROSE 2-4 GM/100ML-% IV SOLN
2.0000 g | INTRAVENOUS | Status: AC
  Administered 2022-08-10: 2 g via INTRAVENOUS
  Filled 2022-08-10: qty 100

## 2022-08-10 MED ORDER — CEFAZOLIN SODIUM-DEXTROSE 2-4 GM/100ML-% IV SOLN
2.0000 g | Freq: Three times a day (TID) | INTRAVENOUS | Status: AC
  Administered 2022-08-10 – 2022-08-11 (×3): 2 g via INTRAVENOUS
  Filled 2022-08-10 (×3): qty 100

## 2022-08-10 MED ORDER — HYDROMORPHONE HCL 1 MG/ML IJ SOLN: 0.5000 mg | INTRAMUSCULAR | Status: DC | PRN

## 2022-08-10 SURGICAL SUPPLY — 78 items
AID PSTN UNV HD RSTRNT DISP (MISCELLANEOUS) ×1
ALCOHOL 70% 16 OZ (MISCELLANEOUS) ×1 IMPLANT
APL PRP STRL LF DISP 70% ISPRP (MISCELLANEOUS) ×2
AUG COMP REV MI TAPER ADAPTER (Joint) ×1 IMPLANT
AUGMENT COMP REV MI TAPR ADPTR (Joint) IMPLANT
BAG COUNTER SPONGE SURGICOUNT (BAG) ×1 IMPLANT
BAG SPNG CNTER NS LX DISP (BAG) ×1
BIT DRILL 2.7 W/STOP DISP (BIT) IMPLANT
BIT DRILL QUICK REL 1/8 2PK SL (DRILL) IMPLANT
BIT DRILL TWIST 2.7 (BIT) IMPLANT
BLADE SAW SGTL 13X75X1.27 (BLADE) ×1 IMPLANT
BNDG COHESIVE 1X5 TAN STRL LF (GAUZE/BANDAGES/DRESSINGS) IMPLANT
BRNG HUM RVRS SHLDR CMPRH (Insert) ×1 IMPLANT
BSPLAT GLND SM AUG TPR ADPR (Joint) ×1 IMPLANT
CHLORAPREP W/TINT 26 (MISCELLANEOUS) ×1 IMPLANT
COOLER ICEMAN CLASSIC (MISCELLANEOUS) ×1 IMPLANT
COVER SURGICAL LIGHT HANDLE (MISCELLANEOUS) ×1 IMPLANT
DRAPE INCISE IOBAN 66X45 STRL (DRAPES) ×1 IMPLANT
DRAPE U-SHAPE 47X51 STRL (DRAPES) ×2 IMPLANT
DRILL QUICK RELEASE 1/8 INCH (DRILL) ×1
DRSG AQUACEL AG ADV 3.5X10 (GAUZE/BANDAGES/DRESSINGS) ×1 IMPLANT
ELECT BLADE 4.0 EZ CLEAN MEGAD (MISCELLANEOUS) ×1
ELECT REM PT RETURN 9FT ADLT (ELECTROSURGICAL) ×1
ELECTRODE BLDE 4.0 EZ CLN MEGD (MISCELLANEOUS) ×1 IMPLANT
ELECTRODE REM PT RTRN 9FT ADLT (ELECTROSURGICAL) ×1 IMPLANT
GAUZE SPONGE 4X4 12PLY STRL LF (GAUZE/BANDAGES/DRESSINGS) ×1 IMPLANT
GLENOID SPHERE 36+6 (Joint) IMPLANT
GLOVE BIOGEL PI IND STRL 6.5 (GLOVE) ×1 IMPLANT
GLOVE BIOGEL PI IND STRL 8 (GLOVE) ×1 IMPLANT
GLOVE ECLIPSE 6.5 STRL STRAW (GLOVE) ×1 IMPLANT
GLOVE ECLIPSE 8.0 STRL XLNG CF (GLOVE) ×1 IMPLANT
GOWN STRL REUS W/ TWL LRG LVL3 (GOWN DISPOSABLE) ×1 IMPLANT
GOWN STRL REUS W/ TWL XL LVL3 (GOWN DISPOSABLE) ×1 IMPLANT
GOWN STRL REUS W/TWL LRG LVL3 (GOWN DISPOSABLE) ×1
GOWN STRL REUS W/TWL XL LVL3 (GOWN DISPOSABLE) ×1
GUIDE MODEL REV SHLD RT (ORTHOPEDIC DISPOSABLE SUPPLIES) IMPLANT
HYDROGEN PEROXIDE 16OZ (MISCELLANEOUS) ×1 IMPLANT
INSERT HUM BEARING 36 +3 (Insert) IMPLANT
JET LAVAGE IRRISEPT WOUND (IRRIGATION / IRRIGATOR) ×2
KIT BASIN OR (CUSTOM PROCEDURE TRAY) ×1 IMPLANT
KIT TURNOVER KIT B (KITS) ×1 IMPLANT
LAVAGE JET IRRISEPT WOUND (IRRIGATION / IRRIGATOR) ×1 IMPLANT
MANIFOLD NEPTUNE II (INSTRUMENTS) ×1 IMPLANT
NDL SUT 6 .5 CRC .975X.05 MAYO (NEEDLE) IMPLANT
NEEDLE MAYO TAPER (NEEDLE) ×1
NS IRRIG 1000ML POUR BTL (IV SOLUTION) ×1 IMPLANT
PACK SHOULDER (CUSTOM PROCEDURE TRAY) ×1 IMPLANT
PAD COLD SHLDR WRAP-ON (PAD) ×1 IMPLANT
PIN THREADED REVERSE (PIN) IMPLANT
REAMER GUIDE BUSHING SURG DISP (MISCELLANEOUS) IMPLANT
REAMER GUIDE W/SCREW AUG (MISCELLANEOUS) IMPLANT
RESTRAINT HEAD UNIVERSAL NS (MISCELLANEOUS) ×1 IMPLANT
RETRIEVER SUT HEWSON (MISCELLANEOUS) ×1 IMPLANT
SCREW BONE LOCKING 4.75X30X3.5 (Screw) IMPLANT
SCREW BONE STRL 6.5MMX25MM (Screw) IMPLANT
SCREW LOCKING 4.75MMX15MM (Screw) IMPLANT
SCREW LOCKING STRL 4.75X25X3.5 (Screw) IMPLANT
SLING ARM IMMOBILIZER LRG (SOFTGOODS) ×1 IMPLANT
SLING ARM IMMOBILIZER XL (CAST SUPPLIES) IMPLANT
SOL PREP POV-IOD 4OZ 10% (MISCELLANEOUS) ×1 IMPLANT
SPONGE T-LAP 18X18 ~~LOC~~+RFID (SPONGE) ×1 IMPLANT
STEM HUMERAL STRL 12MMX83MM (Stem) IMPLANT
STRIP CLOSURE SKIN 1/2X4 (GAUZE/BANDAGES/DRESSINGS) ×1 IMPLANT
SUCTION FRAZIER HANDLE 10FR (MISCELLANEOUS) ×1
SUCTION TUBE FRAZIER 10FR DISP (MISCELLANEOUS) ×1 IMPLANT
SUT BROADBAND TAPE 2PK 1.5 (SUTURE) IMPLANT
SUT MNCRL AB 3-0 PS2 18 (SUTURE) ×1 IMPLANT
SUT SILK 2 0 TIES 10X30 (SUTURE) ×1 IMPLANT
SUT VIC AB 0 CT1 27 (SUTURE) ×2
SUT VIC AB 0 CT1 27XBRD ANBCTR (SUTURE) ×4 IMPLANT
SUT VIC AB 1 CT1 27 (SUTURE) ×8
SUT VIC AB 1 CT1 27XBRD ANBCTR (SUTURE) ×2 IMPLANT
SUT VIC AB 2-0 CT1 27 (SUTURE) ×4
SUT VIC AB 2-0 CT1 TAPERPNT 27 (SUTURE) ×3 IMPLANT
SUT VICRYL 0 UR6 27IN ABS (SUTURE) ×2 IMPLANT
TOWEL GREEN STERILE (TOWEL DISPOSABLE) ×1 IMPLANT
TRAY HUM REV SHOULDER STD +6 (Shoulder) IMPLANT
WATER STERILE IRR 1000ML POUR (IV SOLUTION) ×1 IMPLANT

## 2022-08-10 NOTE — Brief Op Note (Signed)
   08/10/2022  11:32 AM  PATIENT:  [name]  57 y.o. male  PRE-OPERATIVE DIAGNOSIS:  right shoulder rotator cuff arthropathy  POST-OPERATIVE DIAGNOSIS:  right shoulder rotator cuff arthropathy  PROCEDURE:  Procedure(s): RIGHT REVERSE SHOULDER ARTHROPLASTY,   SURGEON:  Surgeon(s): [name], Corrie Mckusick, MD  ASSISTANT: magnant pa  ANESTHESIA:   general  EBL: 100 ml    Total I/O In: 700 [I.V.:700] Out: 150 [Blood:150]  BLOOD ADMINISTERED: none  DRAINS: none   LOCAL MEDICATIONS USED: vanco  SPECIMEN:  No Specimen  COUNTS:  YES  TOURNIQUET:  * No tourniquets in log *  DICTATION: .Other Dictation: Dictation Number [id number]  PLAN OF CARE: Admit for overnight observation  PATIENT DISPOSITION:  PACU - hemodynamically stable

## 2022-08-10 NOTE — Transfer of Care (Addendum)
Immediate Anesthesia Transfer of Care Note  Patient: [name]  Procedure(s) Performed: RIGHT REVERSE SHOULDER ARTHROPLASTY, BICEPS TENODESIS (Right: Shoulder)  Patient Location: PACU  Anesthesia Type:General and Regional  Level of Consciousness: drowsy and patient cooperative  Airway & Oxygen Therapy: Patient Spontanous Breathing and Patient connected to nasal cannula oxygen  Post-op Assessment: Report given to RN and Post -op Vital signs reviewed and stable  Post vital signs: Reviewed and stable  Last Vitals:  Vitals Value Taken Time  BP 114/61 08/10/22 1127  Temp    Pulse 77 08/10/22 1130  Resp 20 08/10/22 1130  SpO2 93 % 08/10/22 1130  Vitals shown include unvalidated device data.  Last Pain:  Vitals:   08/10/22 0614  TempSrc:   PainSc: 7          Complications: No notable events documented.

## 2022-08-10 NOTE — Anesthesia Procedure Notes (Signed)
Anesthesia Regional Block: Interscalene brachial plexus block   Pre-Anesthetic Checklist: , timeout performed,  Correct Patient, Correct Site, Correct Laterality,  Correct Procedure, Correct Position, site marked,  Risks and benefits discussed,  Surgical consent,  Pre-op evaluation,  At surgeon's request and post-op pain management  Laterality: Right  Prep: chloraprep       Needles:  Injection technique: Single-shot  Needle Type: Echogenic Stimulator Needle     Needle Length: 9cm  Needle Gauge: 21     Additional Needles:   Procedures:,,,, ultrasound used (permanent image in chart),,    Narrative:  Start time: 08/10/2022 7:10 AM End time: 08/10/2022 7:15 AM Injection made incrementally with aspirations every 5 mL.  Performed by: Personally  Anesthesiologist: Shelton Silvas, MD  Additional Notes: Discussed risks and benefits of the nerve block in detail, including but not limited vascular injury, permanent nerve damage and infection.   Patient tolerated the procedure well. Local anesthetic introduced in an incremental fashion under minimal resistance after negative aspirations. No paresthesias were elicited. After completion of the procedure, no acute issues were identified and patient continued to be monitored by RN.

## 2022-08-10 NOTE — Progress Notes (Signed)
Verbal order for C Pap due sleep apnea per MD Tessa Lerner

## 2022-08-10 NOTE — Anesthesia Procedure Notes (Addendum)
Procedure Name: Intubation Date/Time: 08/10/2022 7:43 AM  Performed by: Gus Puma, CRNAPre-anesthesia Checklist: Patient identified, Emergency Drugs available, Suction available and Patient being monitored Patient Re-evaluated:Patient Re-evaluated prior to induction Oxygen Delivery Method: Circle System Utilized Preoxygenation: Pre-oxygenation with 100% oxygen Induction Type: IV induction Ventilation: Mask ventilation without difficulty Laryngoscope Size: Mac and 4 Grade View: Grade II Tube type: Oral Tube size: 7.5 mm Number of attempts: 1 Airway Equipment and Method: Stylet Placement Confirmation: ETT inserted through vocal cords under direct vision, positive ETCO2 and breath sounds checked- equal and bilateral Secured at: 23 cm Tube secured with: Tape Dental Injury: Teeth and Oropharynx as per pre-operative assessment

## 2022-08-10 NOTE — Op Note (Signed)
NAME: [name], [name] MEDICAL RECORD NO: [id number] ACCOUNT NO: [id number] DATE OF BIRTH: 1965-12-14 FACILITY: [facility] LOCATION: [facility] PHYSICIAN: Graylin Shiver. [name], MD  Operative Report   DATE OF PROCEDURE: 08/10/2022  PREOPERATIVE DIAGNOSES:  Right shoulder rotator cuff arthropathy and arthritis.  POSTOPERATIVE DIAGNOSES:  Right shoulder rotator cuff arthropathy and arthritis.  PROCEDURE:  Right reverse shoulder replacement.  SURGEON ATTENDING:  Graylin Shiver. [name], MD  ASSISTANT:  Karenann Cai, PA.  INDICATIONS:  This is a 57 year old patient with right shoulder pain.  Underwent massive rotator cuff tear repair last year.  He has since had failure of that repair and reports significant pain and weakness in the right arm.  DESCRIPTION OF PROCEDURE:  The patient was brought to the operating room where general anesthetic was induced.  Preoperative antibiotics administered.  Timeout was called.  Right shoulder, arm and hand was prescrubbed with hydrogen peroxide followed by  alcohol and then Betadine, which was allowed to air dry, then ChloraPrep solution. The patient's head in neutral position.  Operative field was covered with Ioban.  After sterile prepping and draping.  After calling timeout, deltopectoral approach was  made.  Skin and subcutaneous tissue were sharply divided.  Cephalic vein was mobilized medially.  Branch avulsion required ligation of the cephalic vein.  Deltopectoral interval was then opened.  The patient did have tears of the posterior superior  rotator cuff with retraction.  Subacromial and subdeltoid adhesions were released.  Deltoid was elevated manually off its anterior attachment.  Axillary nerve was palpated and protected at all times.  Kolbel retractor was placed.  Prior biceps tenodesis  had been performed.  The upper 1.5 to 2 cm of the pec was released in order to facilitate mobilization.  The circumflex vessels were ligated and tied with the silk sutures.  The subscap  was then detached along with the capsule along the inferior humeral  neck using progressive external rotation to the 5 o'clock position.  The capsule was also detached along the inferior 2 cm off the humeral neck.  Following this, the head was dislocated.  Rotator cuff tear was torn and not repairable in the posterior  superior rotator cuff.  There was also about 1/4 sized area of grade III-IV chondromalacia on the middle portion of the humeral head.  Next, the reaming was then performed in the superior aspect of the humeral head.  This was performed up to size 12 mm.   Multiple areas of suture and suture anchors were removed from the humeral head.  No evidence of infection, but there were multiple regions of sutures and suture anchors within the tuberosity.  That definitely structurally be stabilized the tuberosity to  some degree. The broaching was then performed after the head was cut in about 30 degrees of external rotation.  Broaching performed up to size 12 with very good fit obtained.  Next, the cap was placed and attention directed towards the glenoid.   Circumferential excision of the labrum was performed with a Bankart lesion created from the 12 o'clock to 6 o'clock position.  Anterior retractor placed.  The patient-specific guide was placed and reaming was performed.  Bone quality was pretty  reasonable on the glenoid.  Superior reaming was performed for the augment.  Next, the trial glenoid baseplate was placed with good position obtained.  True baseplate then placed with very good compression with a central compression screw and four  peripheral locking screws were then placed.  Next, trial reduction was performed with multiple  constructs.  The most stable construct was +6 glenosphere with about 1 mm inferior offset followed by a +6 humeral tray offset with +3 retentive liner.  With  that construct the patient had very good stability to adduction, extension, and the superior force.  Also, had  very good range of motion. We did resect some of the tuberosity, so that did impinge underneath the acromion.  This bone quality was not great  anyway due to multiple prior rotator cuff tear repairs.  Next, after secure construct was achieved we removed the trial glenosphere as well as, trial stem and put suture tapes into the tuberosity.  Irrigated the canal out with irrigating solution and  IrriSept solution. IrriSept solution then removed and vancomycin powder placed and the stem was placed and the same stability parameters were maintained with the +6 humeral tray and +3 definitive liner.  Excellent stability achieved.  Next, thorough  irrigation was performed.  No impingement with internal and external rotation, 90 degrees of abduction.  Pouring irrigation used x4 liters.  Vancomycin powder was then placed on the prosthesis.  Subscap was partially repaired inferiorly with the suture  tapes, but there was some pull-through through the lesser tuberosity with the other suture tapes.  Next, thorough irrigation was again performed with pouring irrigation followed by IrriSept solution followed by vancomycin powder, which was a gram placed  on the prosthesis.  The axillary nerve was again palpated and found to be intact.  Next, the deltopectoral interval was closed using #1 Vicryl suture followed by interrupted inverted 0 Vicryl suture, 2-0 Vicryl suture, and 3-0 Monocryl with Steri-Strips  and Aquacel dressing, and extra-large immobilizer applied.  The patient tolerated the procedure well without immediate complications, transferred to the recovery room in stable condition.  [name]'s assistance was required at all times for retraction,  opening, closing, mobilization of tissues and his assistance was a medical necessity.   PUS D: 08/10/2022 11:42:17 am T: 08/10/2022 3:48:00 pm  JOB: [id number]/ [id number]

## 2022-08-10 NOTE — H&P (Signed)
[name] is an 57 y.o. male.   Chief Complaint: Right shoulder pain HPI: [name] is a 57 y.o. male who presents reporting right shoulder pain.  Patient had massive rotator cuff tear repair done in November.  Did not report any definite injury but reported relatively sudden onset of pain and weakness in that right shoulder.  He states currently the pain is equivalent to the weakness in terms of his primary complaints.  He states he can hardly eat.  He denies any fevers or chills.  He does live alone.  Is a low demand individual in terms of nothing outside of ADLs that he requires of his shoulder.   MRI scan does show read tearing of the rotator cuff tendon with retraction.  Involves the supraspinatus and infraspinatus.  Teres minor and subscap tendons unremarkable..   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   Anxiety    Anxiety and depression    Arthritis    knees and shoulders   Benzodiazepine withdrawal with complication 01/09/2019   Bruit    L   Chest pain    hx   Cognitive complaints with normal neuropsychological exam 10/23/2016   Diabetes mellitus without complication    "borderline", diet controlled, no meds, patient has lost 30 lbs   Edema    Fatty liver    GERD (gastroesophageal reflux disease)    uses Omeprazole   Goiter    HLD (hyperlipidemia)    Hypertension    essential, benign   Hypothyroidism    Impotence of organic origin    Murmur    never has caused any problems   Neuromuscular disorder    neuropathy bilateral feet   Other chest pain    tightness, pressure   Palpitation    hx   Precordial pain    Sleep apnea    uses cpap    Past Surgical History:  Procedure Laterality Date   APPENDECTOMY  2002   done at [facility]   CARDIAC CATHETERIZATION     CARDIAC CATHETERIZATION N/A 08/27/2015   Procedure: Left Heart Cath and Coronary Angiography;  Surgeon:  Dolores Patty, MD;  Location: [facility] INVASIVE CV LAB;  Service: Cardiovascular;  Laterality: N/A;   COLONOSCOPY  in his 20's   COLONOSCOPY  2019   EAR CYST EXCISION Left 07/08/2019   Procedure: open excision baker's cyst left knee;  Surgeon: Cammy Copa, MD;  Location: [facility] OR;  Service: Orthopedics;  Laterality: Left;   HERNIA REPAIR  05/15/2013   umbilical   JOINT REPLACEMENT     right knee   KNEE ARTHROSCOPY Left 12/17/2018   Procedure: left knee arthroscopy, meniscal debridement, loose body removal;  Surgeon: Cammy Copa, MD;  Location: [facility] OR;  Service: Orthopedics;  Laterality: Left;   REVISION TOTAL KNEE ARTHROPLASTY Right 06/23/2014   DR [name]   SHOULDER ARTHROSCOPY WITH ROTATOR CUFF REPAIR AND SUBACROMIAL DECOMPRESSION Right 03/07/2022   Procedure: RIGHT SHOULDER ARTHROSCOPY, DEBRIDEMENT, MINI OPEN ROTATOR CUFF TEAR REPAIR;  Surgeon: Cammy Copa, MD;  Location: [facility] OR;  Service: Orthopedics;  Laterality: Right;   TONSILLECTOMY     TOTAL KNEE ARTHROPLASTY Right 2011   right   TOTAL KNEE ARTHROPLASTY Left 02/10/2021   Procedure: LEFT TOTAL KNEE ARTHROPLASTY;  Surgeon: Cammy Copa, MD;  Location: [facility] OR;  Service: Orthopedics;  Laterality: Left;   TOTAL KNEE REVISION Right 06/23/2014  Procedure: TOTAL KNEE REVISION;  Surgeon: Cammy Copa, MD;  Location: [facility] OR;  Service: Orthopedics;  Laterality: Right;   UPPER GASTROINTESTINAL ENDOSCOPY  04/30/2013   Demetrius Charity TOOTH EXTRACTION      Family History  Problem Relation Age of Onset   Heart disease Father    Colon cancer Neg Hx    Colon polyps Neg Hx    Esophageal cancer Neg Hx    Rectal cancer Neg Hx    Stomach cancer Neg Hx    Social History:  reports that he quit smoking about 17 years ago. His smoking use included cigarettes. He has a 36.00 pack-year smoking history. He has never used smokeless tobacco. He reports current alcohol use of about 4.0 standard drinks of alcohol per week.  He reports that he does not use drugs.  Allergies:  Allergies  Allergen Reactions   Ambien [Zolpidem] Swelling and Other (See Comments)    Swelling in the throat    Medications Prior to Admission  Medication Sig Dispense Refill   ARIPiprazole (ABILIFY) 5 MG tablet Take one tab at bed time 90 tablet 0   atorvastatin (LIPITOR) 80 MG tablet Take 1 tablet (80 mg total) by mouth daily. 180 tablet 3   buPROPion (WELLBUTRIN XL) 300 MG 24 hr tablet Take 1 tablet (300 mg total) by mouth daily. 90 tablet 0   busPIRone (BUSPAR) 15 MG tablet Take 1 tablet (15 mg total) by mouth 3 (three) times daily. 270 tablet 1   carvedilol (COREG) 12.5 MG tablet Take 1 tablet (12.5 mg total) by mouth 2 (two) times daily. 180 tablet 3   clonazePAM (KLONOPIN) 2 MG tablet TAKE 1/2 (ONE-HALF) TABLET BY MOUTH TWICE DAILY AS NEEDED FOR ANXIETY 30 tablet 2   dicyclomine (BENTYL) 20 MG tablet Take 1 tablet (20 mg total) by mouth every 6 (six) hours. 120 tablet 0   empagliflozin (JARDIANCE) 10 MG TABS tablet Take 10 mg by mouth daily.     ezetimibe (ZETIA) 10 MG tablet Take 1 tablet (10 mg total) by mouth daily. 90 tablet 3   furosemide (LASIX) 20 MG tablet Take 1 tablet (20 mg total) by mouth 2 (two) times a week. 4520 tablet 3   gabapentin (NEURONTIN) 300 MG capsule 3 tablets in the morning and evening ,  2 tablets at night 240 capsule 5   HYDROcodone-acetaminophen (NORCO) 10-325 MG tablet Take 1 tablet by mouth 2 (two) times daily as needed. 60 tablet 0   ibuprofen (ADVIL) 200 MG tablet Take 400 mg by mouth every 6 (six) hours as needed for moderate pain.     isosorbide mononitrate (IMDUR) 30 MG 24 hr tablet Take 1 tablet (30 mg total) by mouth daily. 90 tablet 3   levothyroxine (SYNTHROID) 88 MCG tablet Take 1 tablet (88 mcg total) by mouth daily before breakfast. 90 tablet 1   losartan (COZAAR) 100 MG tablet Take 1 tablet by mouth once daily 90 tablet 3   metFORMIN (GLUCOPHAGE) 500 MG tablet TAKE 1 TABLET BY MOUTH  TWICE DAILY WITH A MEAL 180 tablet 1   methocarbamol (ROBAXIN) 500 MG tablet Take 1 tablet (500 mg total) by mouth every 8 (eight) hours as needed. 90 tablet 2   Multiple Vitamin (MULTIVITAMIN WITH MINERALS) TABS tablet Take 1 tablet by mouth daily.     naloxone (NARCAN) nasal spray 4 mg/0.1 mL Use as needed for excessive sedation /opioid overdose 1 each 1   omeprazole (PRILOSEC) 40 MG capsule TAKE  1 CAPSULE BY MOUTH IN THE MORNING AND 1 CAPSULE IN THE EVENING 180 capsule 3   traZODone (DESYREL) 100 MG tablet TAKE 1 & 1/2 (ONE & ONE-HALF) TABLETS BY MOUTH AT BEDTIME 135 tablet 2   Blood Glucose Monitoring Suppl (ACCU-CHEK AVIVA PLUS) w/Device KIT Use to check blood sugars up to 4 times daily. ICD-10 E11.40 1 kit 0   glucose blood (ACCU-CHEK AVIVA PLUS) test strip Use to check blood sugars up to 4 times daily. ICD-10 E11.40 400 each 0   Lancets (ACCU-CHEK MULTICLIX) lancets Use to to check blood sugars up to 4 times daily. ICD-10: E11.40 400 each 0   promethazine (PHENERGAN) 12.5 MG tablet Take 1 tablet (12.5 mg total) by mouth every 4 (four) hours as needed for nausea or vomiting. (Patient taking differently: Take 12.5 mg by mouth every 8 (eight) hours as needed for nausea or vomiting.) 20 tablet 0    Results for orders placed or performed during the hospital encounter of 08/10/22 (from the past 48 hour(s))  Glucose, capillary     Status: Abnormal   Collection Time: 08/10/22  5:52 AM  Result Value Ref Range   Glucose-Capillary 119 (H) 70 - 99 mg/dL    Comment: Glucose reference range applies only to samples taken after fasting for at least 8 hours.   *Note: Due to a large number of results and/or encounters for the requested time period, some results have not been displayed. A complete set of results can be found in Results Review.   No results found.  Review of Systems  Musculoskeletal:  Positive for arthralgias.  All other systems reviewed and are negative.   Blood pressure (!) 153/95,  pulse 89, temperature 98.1 F (36.7 C), temperature source Oral, resp. rate 20, height 6' (1.829 m), weight 104.3 kg, SpO2 92 %. Physical Exam Vitals reviewed.  HENT:     Head: Normocephalic.     Nose: Nose normal.     Mouth/Throat:     Mouth: Mucous membranes are moist.  Eyes:     Pupils: Pupils are equal, round, and reactive to light.  Cardiovascular:     Rate and Rhythm: Normal rate.     Pulses: Normal pulses.  Pulmonary:     Effort: Pulmonary effort is normal.  Abdominal:     General: Abdomen is flat.  Musculoskeletal:     Cervical back: Normal range of motion.  Skin:    General: Skin is warm.     Capillary Refill: Capillary refill takes less than 2 seconds.  Neurological:     General: No focal deficit present.     Mental Status: He is alert.    Ortho exam demonstrates forward flexion and abduction both below 45 degrees. Deltoid is functional. Incision is intact with no redness or erythema. No axillary lymphadenopathy is present. Passive range of motion is maintained above 90 degrees of forward flexion and abduction. He does have positive external rotation lag testing. Pretty profound weakness to infraspinatus testing at 2 out of 5 but subscap strength is 5+ out of 5.   Assessment/Plan Impression is profound external rotation weakness following massive rotator cuff tear repair. The tendons are retracted and do not necessarily look repairable. Options at this time are discussed with [name] including observation, tendon transfer which would be predictable for strength improvement but not as predictable for pain relief. Reverse replacement would be the final option which would be more predictable for pain relief and not as helpful for the strength component particularly  with external rotation. Latissimus transfer to be considered in this case but his teres does remain intact. Had a  discussion with [name] about the various rehab options. Does not look like infection is at play here -   after long discussion [name] wants to proceed with reverse replacement. Implant longevity is discussed but because of his demand level I think it is likely that this could last a longer time. He has had both knees replaced and 1 revised as well.  The risk and benefits of reverse shoulder replacement are discussed with [name] including not limited to infection nerve and vessel damage instability as well as potential need for revision in his lifetime.  Plan to use patient specific instrumentation to minimize bone resection requirements.  Patient understands the risk and benefits and wishes to proceed.  All questions answered  Burnard Bunting, MD 08/10/2022, 6:48 AM

## 2022-08-10 NOTE — Anesthesia Postprocedure Evaluation (Signed)
Anesthesia Post Note  Patient: [name]  Procedure(s) Performed: RIGHT REVERSE SHOULDER ARTHROPLASTY, BICEPS TENODESIS (Right: Shoulder)     Patient location during evaluation: PACU Anesthesia Type: Regional and General Level of consciousness: awake and alert Pain management: pain level controlled Vital Signs Assessment: post-procedure vital signs reviewed and stable Respiratory status: spontaneous breathing, nonlabored ventilation, respiratory function stable and patient connected to nasal cannula oxygen Cardiovascular status: blood pressure returned to baseline and stable Postop Assessment: no apparent nausea or vomiting Anesthetic complications: no  No notable events documented.  Last Vitals:  Vitals:   08/10/22 1300 08/10/22 1315  BP: 130/64 136/67  Pulse: 75 73  Resp: 15 14  Temp: 36.6 C   SpO2: 93% 96%    Last Pain:  Vitals:   08/10/22 1200  TempSrc:   PainSc: 7                  Shelton Silvas

## 2022-08-11 ENCOUNTER — Encounter (HOSPITAL_COMMUNITY): Payer: Self-pay | Admitting: Orthopedic Surgery

## 2022-08-11 DIAGNOSIS — Z87891 Personal history of nicotine dependence: Secondary | ICD-10-CM | POA: Diagnosis not present

## 2022-08-11 DIAGNOSIS — I160 Hypertensive urgency: Secondary | ICD-10-CM | POA: Diagnosis not present

## 2022-08-11 DIAGNOSIS — J9811 Atelectasis: Secondary | ICD-10-CM | POA: Diagnosis present

## 2022-08-11 DIAGNOSIS — M19011 Primary osteoarthritis, right shoulder: Secondary | ICD-10-CM | POA: Diagnosis present

## 2022-08-11 DIAGNOSIS — Z96611 Presence of right artificial shoulder joint: Secondary | ICD-10-CM | POA: Diagnosis not present

## 2022-08-11 DIAGNOSIS — I959 Hypotension, unspecified: Secondary | ICD-10-CM | POA: Diagnosis not present

## 2022-08-11 DIAGNOSIS — Z7984 Long term (current) use of oral hypoglycemic drugs: Secondary | ICD-10-CM | POA: Diagnosis not present

## 2022-08-11 DIAGNOSIS — Z8249 Family history of ischemic heart disease and other diseases of the circulatory system: Secondary | ICD-10-CM | POA: Diagnosis not present

## 2022-08-11 DIAGNOSIS — E785 Hyperlipidemia, unspecified: Secondary | ICD-10-CM | POA: Diagnosis present

## 2022-08-11 DIAGNOSIS — F10931 Alcohol use, unspecified with withdrawal delirium: Secondary | ICD-10-CM | POA: Diagnosis not present

## 2022-08-11 DIAGNOSIS — G4733 Obstructive sleep apnea (adult) (pediatric): Secondary | ICD-10-CM | POA: Diagnosis present

## 2022-08-11 DIAGNOSIS — Z79890 Hormone replacement therapy (postmenopausal): Secondary | ICD-10-CM | POA: Diagnosis not present

## 2022-08-11 DIAGNOSIS — E039 Hypothyroidism, unspecified: Secondary | ICD-10-CM | POA: Diagnosis present

## 2022-08-11 DIAGNOSIS — I10 Essential (primary) hypertension: Secondary | ICD-10-CM | POA: Diagnosis present

## 2022-08-11 DIAGNOSIS — G9341 Metabolic encephalopathy: Secondary | ICD-10-CM | POA: Diagnosis not present

## 2022-08-11 DIAGNOSIS — Z96653 Presence of artificial knee joint, bilateral: Secondary | ICD-10-CM | POA: Diagnosis present

## 2022-08-11 DIAGNOSIS — D649 Anemia, unspecified: Secondary | ICD-10-CM | POA: Diagnosis present

## 2022-08-11 DIAGNOSIS — E1142 Type 2 diabetes mellitus with diabetic polyneuropathy: Secondary | ICD-10-CM | POA: Diagnosis present

## 2022-08-11 DIAGNOSIS — F419 Anxiety disorder, unspecified: Secondary | ICD-10-CM | POA: Diagnosis present

## 2022-08-11 DIAGNOSIS — E11649 Type 2 diabetes mellitus with hypoglycemia without coma: Secondary | ICD-10-CM | POA: Diagnosis not present

## 2022-08-11 DIAGNOSIS — M25511 Pain in right shoulder: Secondary | ICD-10-CM | POA: Diagnosis present

## 2022-08-11 DIAGNOSIS — E871 Hypo-osmolality and hyponatremia: Secondary | ICD-10-CM | POA: Diagnosis not present

## 2022-08-11 DIAGNOSIS — M75101 Unspecified rotator cuff tear or rupture of right shoulder, not specified as traumatic: Secondary | ICD-10-CM | POA: Diagnosis present

## 2022-08-11 DIAGNOSIS — D696 Thrombocytopenia, unspecified: Secondary | ICD-10-CM | POA: Diagnosis present

## 2022-08-11 DIAGNOSIS — F10139 Alcohol abuse with withdrawal, unspecified: Secondary | ICD-10-CM | POA: Diagnosis not present

## 2022-08-11 DIAGNOSIS — F32A Depression, unspecified: Secondary | ICD-10-CM | POA: Diagnosis present

## 2022-08-11 DIAGNOSIS — E876 Hypokalemia: Secondary | ICD-10-CM | POA: Diagnosis present

## 2022-08-11 LAB — GLUCOSE, CAPILLARY
Glucose-Capillary: 137 mg/dL — ABNORMAL HIGH (ref 70–99)
Glucose-Capillary: 112 mg/dL — ABNORMAL HIGH (ref 70–99)
Glucose-Capillary: 140 mg/dL — ABNORMAL HIGH (ref 70–99)
Glucose-Capillary: 148 mg/dL — ABNORMAL HIGH (ref 70–99)

## 2022-08-11 NOTE — Progress Notes (Signed)
RT messaged for CPAP order.

## 2022-08-11 NOTE — Progress Notes (Addendum)
Patient reevaluated.  Still continuing to have decreased oxygen saturation down to about 82 to 84%.  He does not have any chest pain or any other symptoms aside from he just feels it is difficult to take a deep breath.  Most likely related to interscalene block with involvement of the phrenic nerve.  We will see how he progresses throughout the rest of the day but anticipate that we will hold him overnight and let him go tomorrow morning.  He will stay on 2 L of nasal cannula O2 which has increased his oxygen saturation up to about 91 to 92%.  Patient is agreeable to this plan.  Call our office with any new symptoms if he develops any chest pain or shortness of breath that is worsening.  He specifically denies any chest pain, abdominal pain, headache, any other symptoms aside from difficulty taking deep breath and shoulder shortness.  Patient discussed with Dr. [name]

## 2022-08-11 NOTE — Evaluation (Signed)
Occupational Therapy Evaluation/Discharge Patient Details Name: [name] MRN: [id number] DOB: [birth year] Today's Date: 08/11/2022   History of Present Illness Pt is a 57 y/o male admitted for R reverse total shoulder arthroplasty. PMH: prior rotator cuff tear repair 11/23, sleep apnea, DM, GERD, HTN, HLD   Clinical Impression   PTA, pt lives alone and typically Independent with all daily tasks w/ intermittent cane use. Pt reports his son will be staying with him for 1 week to assist. Pt presents now w/ minor deficits in R shoulder pain and cardiopulmonary tolerance. Educated re: sling mgmt, sleeping positions, compensatory strategies for ADLs, allowed AROM hand/wrist/elbow exercises, pendulums and iceman machine mgmt. Pt able to manage UB ADL with Min A and LB ADLs with Supervision. Pt able to mobilize in room w/ Supervision though does endorse some dizziness. Recommend follow up therapy per surgeon's protocol.  SpO2 82% on RA with activity, 89-91% on 2 L O2 once in recliner       Recommendations for follow up therapy are one component of a multi-disciplinary discharge planning process, led by the attending physician.  Recommendations may be updated based on patient status, additional functional criteria and insurance authorization.   Assistance Recommended at Discharge PRN  Patient can return home with the following A lot of help with bathing/dressing/bathroom;Assistance with cooking/housework;Assist for transportation    Functional Status Assessment  Patient has had a recent decline in their functional status and demonstrates the ability to make significant improvements in function in a reasonable and predictable amount of time.  Equipment Recommendations  BSC/3in1    Recommendations for Other Services       Precautions / Restrictions Precautions Precautions: Fall;Shoulder Shoulder Interventions: Shoulder sling/immobilizer;Off for dressing/bathing/exercises Precaution Booklet  Issued: Yes (comment) Required Braces or Orthoses: Sling Restrictions Weight Bearing Restrictions: Yes RUE Weight Bearing: Non weight bearing      Mobility Bed Mobility Overal bed mobility: Modified Independent                  Transfers Overall transfer level: Independent Equipment used: None                      Balance Overall balance assessment: No apparent balance deficits (not formally assessed)                                         ADL either performed or assessed with clinical judgement   ADL Overall ADL's : Needs assistance/impaired Eating/Feeding: Modified independent;Sitting   Grooming: Supervision/safety;Standing   Upper Body Bathing: Minimal assistance;Sitting   Lower Body Bathing: Supervison/ safety;Sitting/lateral leans;Sit to/from stand   Upper Body Dressing : Minimal assistance;Sitting   Lower Body Dressing: Supervision/safety;Sit to/from stand;Sitting/lateral leans   Toilet Transfer: Supervision/safety;Ambulation   Toileting- Clothing Manipulation and Hygiene: Supervision/safety;Sitting/lateral lean;Sit to/from stand       Functional mobility during ADLs: Supervision/safety       Vision Ability to See in Adequate Light: 0 Adequate Patient Visual Report: No change from baseline Vision Assessment?: No apparent visual deficits     Perception     Praxis      Pertinent Vitals/Pain Pain Assessment Pain Assessment: Faces Faces Pain Scale: Hurts little more Pain Location: R shoulder Pain Descriptors / Indicators: Grimacing, Guarding Pain Intervention(s): Monitored during session, Premedicated before session, Patient requesting pain meds-RN notified     Hand Dominance Right   Extremity/Trunk Assessment  Upper Extremity Assessment Upper Extremity Assessment: RUE deficits/detail RUE Deficits / Details: s/p shoulder sx, nerve block still in slight effect of bicep/shoulder region. hand/wrist WFL RUE  Sensation: decreased light touch   Lower Extremity Assessment Lower Extremity Assessment: Defer to PT evaluation   Cervical / Trunk Assessment Cervical / Trunk Assessment: Normal   Communication Communication Communication: No difficulties   Cognition Arousal/Alertness: Awake/alert Behavior During Therapy: WFL for tasks assessed/performed, Flat affect Overall Cognitive Status: Within Functional Limits for tasks assessed                                 General Comments: some slower processing but overall [facility]     General Comments       Exercises     Shoulder Instructions      Home Living Family/patient expects to be discharged to:: Private residence Living Arrangements: Alone Available Help at Discharge: Family;Available 24 hours/day Type of Home: Mobile home Home Access: Stairs to enter Entrance Stairs-Number of Steps: 4-5 Entrance Stairs-Rails: Left;Right Home Layout: One level     Bathroom Shower/Tub: Producer, television/film/video: Standard     Home Equipment: Cane - single point   Additional Comments: son planning to stay with pt for 1 week after surgery      Prior Functioning/Environment Prior Level of Function : Independent/Modified Independent             Mobility Comments: occasional cane use if needed though does not always use          OT Problem List: Pain;Cardiopulmonary status limiting activity      OT Treatment/Interventions:      OT Goals(Current goals can be found in the care plan section) Acute Rehab OT Goals Patient Stated Goal: home and avoid further surgeries OT Goal Formulation: All assessment and education complete, DC therapy  OT Frequency:      Co-evaluation              AM-PAC OT "6 Clicks" Daily Activity     Outcome Measure Help from another person eating meals?: None Help from another person taking care of personal grooming?: None Help from another person toileting, which includes using toliet,  bedpan, or urinal?: A Little Help from another person bathing (including washing, rinsing, drying)?: A Little Help from another person to put on and taking off regular upper body clothing?: A Little Help from another person to put on and taking off regular lower body clothing?: A Little 6 Click Score: 20   End of Session Equipment Utilized During Treatment: Oxygen Nurse Communication: Mobility status;Other (comment) (O2)  Activity Tolerance: Patient tolerated treatment well Patient left: in chair;with call bell/phone within reach;with nursing/sitter in room  OT Visit Diagnosis: Pain Pain - Right/Left: Right Pain - part of body: Shoulder                Time: 0347-4259 OT Time Calculation (min): 46 min Charges:  OT General Charges $OT Visit: 1 Visit OT Evaluation $OT Eval Low Complexity: 1 Low OT Treatments $Self Care/Home Management : 8-22 mins $Therapeutic Exercise: 8-22 mins  Bradd Canary, OTR/L Acute Rehab Services Office: [phone]   Lorre Munroe 08/11/2022, 8:54 AM

## 2022-08-11 NOTE — Progress Notes (Signed)
  Subjective: [name] is a 57 y.o. male s/p right RSA.  They are POD 1.  Pt's pain is controlled.  Patient denies any complaints of chest pain, shortness of breath, abdominal pain.  Block is wearing off and he states that the shoulder pain is moderate.  Still has a little bit of numbness and tingling.  No trouble taking a deep breath.  Objective: Vital signs in last 24 hours: Temp:  [97.6 F (36.4 C)-98.2 F (36.8 C)] 97.6 F (36.4 C) (04/19 0521) Pulse Rate:  [73-104] 104 (04/19 0819) Resp:  [10-20] 18 (04/19 0521) BP: (112-146)/(64-88) 122/68 (04/19 0819) SpO2:  [91 %-98 %] 96 % (04/19 0521)  Intake/Output from previous day: 04/18 0701 - 04/19 0700 In: 2098.4 [P.O.:240; I.V.:1658.4; IV Piggyback:200] Out: 1150 [Urine:1000; Blood:150] Intake/Output this shift: No intake/output data recorded.  Exam:  No gross blood or drainage overlying the dressing 2+ radial pulse of the operative extremity Postoperative physical exam somewhat limited by interscalene block but intact EPL, FPL, finger abduction, wrist extension, pronation/supination, grip strength, bicep flexion, tricep extension, deltoid.  Axillary nerve is intact with deltoid firing.   Labs: No results for input(s): "HGB" in the last 72 hours. No results for input(s): "WBC", "RBC", "HCT", "PLT" in the last 72 hours. No results for input(s): "NA", "K", "CL", "CO2", "BUN", "CREATININE", "GLUCOSE", "CALCIUM" in the last 72 hours. No results for input(s): "LABPT", "INR" in the last 72 hours.  Assessment/Plan: Pt is POD 1 s/p right RSA    -Plan to discharge to home today or tomorrow pending patient's pain.  -Patient has CPM machine at home  -Anticipate discharge home today.  He is having a little bit of decreased oxygen saturation on continuous pulse ox but is O2 sats have been fairly steady on periodic checks.  He is asymptomatic.  Will see how he does with physical therapy and if this drops, plan to check his oxygen  saturation with a different machine unless he becomes symptomatic.  He does not use oxygen at home.  He is on a CPAP for OSA but he did not use this yesterday.  -No lifting with the operative arm  -Stay in sling except for showering/sleeping and using CPM machine at home.  No lifting with the operative arm more than 1 to 2 pounds  -Follow-up with Dr. [name] in clinic 2 weeks postoperatively     [facility] 08/11/2022, 8:25 AM

## 2022-08-12 ENCOUNTER — Inpatient Hospital Stay (HOSPITAL_COMMUNITY): Payer: Medicare HMO

## 2022-08-12 DIAGNOSIS — Z96611 Presence of right artificial shoulder joint: Secondary | ICD-10-CM | POA: Diagnosis not present

## 2022-08-12 LAB — BASIC METABOLIC PANEL
Anion gap: 10 (ref 5–15)
BUN: 17 mg/dL (ref 6–20)
CO2: 24 mmol/L (ref 22–32)
Calcium: 8.2 mg/dL — ABNORMAL LOW (ref 8.9–10.3)
Chloride: 99 mmol/L (ref 98–111)
Creatinine, Ser: 1.14 mg/dL (ref 0.61–1.24)
GFR, Estimated: >60 mL/min (ref >60)
Glucose, Bld: 105 mg/dL — ABNORMAL HIGH (ref 70–99)
Potassium: 4.3 mmol/L (ref 3.5–5.1)
Sodium: 133 mmol/L — ABNORMAL LOW (ref 135–145)

## 2022-08-12 LAB — CBC
HCT: 33.8 % — ABNORMAL LOW (ref 39.0–52.0)
Hemoglobin: 11 g/dL — ABNORMAL LOW (ref 13.0–17.0)
MCH: 31 pg (ref 26.0–34.0)
MCHC: 32.5 g/dL (ref 30.0–36.0)
MCV: 95.2 fL (ref 80.0–100.0)
Platelets: 135 10*3/uL — ABNORMAL LOW (ref 150–400)
RBC: 3.55 MIL/uL — ABNORMAL LOW (ref 4.22–5.81)
RDW: 13.5 % (ref 11.5–15.5)
WBC: 6 10*3/uL (ref 4.0–10.5)
nRBC: 0 % (ref 0.0–0.2)

## 2022-08-12 LAB — GLUCOSE, CAPILLARY
Glucose-Capillary: 123 mg/dL — ABNORMAL HIGH (ref 70–99)
Glucose-Capillary: 98 mg/dL (ref 70–99)
Glucose-Capillary: 99 mg/dL (ref 70–99)
Glucose-Capillary: 99 mg/dL (ref 70–99)
Glucose-Capillary: 89 mg/dL (ref 70–99)

## 2022-08-12 MED ORDER — ADULT MULTIVITAMIN W/MINERALS CH
1.0000 | ORAL_TABLET | Freq: Every day | ORAL | Status: DC
Start: 2022-08-12 — End: 2022-08-12

## 2022-08-12 MED ORDER — DOXYCYCLINE HYCLATE 100 MG PO TABS: 100.0000 mg | ORAL_TABLET | Freq: Two times a day (BID) | ORAL | 0 refills | Status: DC

## 2022-08-12 MED ORDER — HYDRALAZINE HCL 20 MG/ML IJ SOLN: 5.0000 mg | Freq: Four times a day (QID) | INTRAMUSCULAR | Status: DC | PRN

## 2022-08-12 MED ORDER — THIAMINE MONONITRATE 100 MG PO TABS
100.0000 mg | ORAL_TABLET | Freq: Every day | ORAL | Status: DC
  Administered 2022-08-12 – 2022-08-16 (×5): 100 mg via ORAL
  Filled 2022-08-12 (×5): qty 1

## 2022-08-12 MED ORDER — CLONAZEPAM 0.5 MG PO TABS
0.5000 mg | ORAL_TABLET | Freq: Two times a day (BID) | ORAL | Status: DC | PRN
  Administered 2022-08-13: 0.5 mg via ORAL
  Filled 2022-08-12: qty 1

## 2022-08-12 MED ORDER — THIAMINE HCL 100 MG/ML IJ SOLN
100.0000 mg | Freq: Every day | INTRAMUSCULAR | Status: DC
Start: 2022-08-12 — End: 2022-08-12

## 2022-08-12 MED ORDER — LORAZEPAM 2 MG/ML IJ SOLN
1.0000 mg | INTRAMUSCULAR | Status: AC | PRN
  Administered 2022-08-12: 1 mg via INTRAVENOUS
  Administered 2022-08-12 – 2022-08-13 (×6): 2 mg via INTRAVENOUS
  Administered 2022-08-13 (×2): 1 mg via INTRAVENOUS
  Administered 2022-08-13: 4 mg via INTRAVENOUS
  Administered 2022-08-14 – 2022-08-15 (×2): 2 mg via INTRAVENOUS
  Filled 2022-08-12 (×4): qty 1
  Filled 2022-08-12: qty 2
  Filled 2022-08-12 (×7): qty 1

## 2022-08-12 MED ORDER — THIAMINE MONONITRATE 100 MG PO TABS
100.0000 mg | ORAL_TABLET | Freq: Every day | ORAL | Status: DC
Start: 2022-08-12 — End: 2022-08-12

## 2022-08-12 MED ORDER — THIAMINE HCL 100 MG/ML IJ SOLN: 100.0000 mg | Freq: Every day | INTRAMUSCULAR | Status: DC

## 2022-08-12 MED ORDER — DOCUSATE SODIUM 100 MG PO CAPS: 100.0000 mg | ORAL_CAPSULE | Freq: Two times a day (BID) | ORAL | 0 refills | Status: DC

## 2022-08-12 MED ORDER — FOLIC ACID 1 MG PO TABS
1.0000 mg | ORAL_TABLET | Freq: Every day | ORAL | Status: DC
  Administered 2022-08-12 – 2022-08-16 (×5): 1 mg via ORAL
  Filled 2022-08-12 (×5): qty 1

## 2022-08-12 MED ORDER — DOXYCYCLINE HYCLATE 100 MG PO TABS
100.0000 mg | ORAL_TABLET | Freq: Two times a day (BID) | ORAL | Status: DC
  Administered 2022-08-12 – 2022-08-16 (×9): 100 mg via ORAL
  Filled 2022-08-12 (×9): qty 1

## 2022-08-12 MED ORDER — OXYCODONE HCL 5 MG PO TABS: 5.0000 mg | ORAL_TABLET | ORAL | 0 refills | Status: DC | PRN

## 2022-08-12 MED ORDER — ASPIRIN 81 MG PO TBEC: 81.0000 mg | DELAYED_RELEASE_TABLET | Freq: Every day | ORAL | 12 refills | Status: AC

## 2022-08-12 MED ORDER — LABETALOL HCL 5 MG/ML IV SOLN
10.0000 mg | Freq: Once | INTRAVENOUS | Status: DC
  Filled 2022-08-12: qty 4

## 2022-08-12 MED ORDER — LABETALOL HCL 5 MG/ML IV SOLN
10.0000 mg | INTRAVENOUS | Status: DC | PRN
  Administered 2022-08-13 – 2022-08-15 (×6): 10 mg via INTRAVENOUS
  Filled 2022-08-12 (×5): qty 4

## 2022-08-12 MED ORDER — ADULT MULTIVITAMIN W/MINERALS CH
1.0000 | ORAL_TABLET | Freq: Every day | ORAL | Status: DC
  Administered 2022-08-12 – 2022-08-16 (×5): 1 via ORAL
  Filled 2022-08-12 (×5): qty 1

## 2022-08-12 MED ORDER — OLANZAPINE 10 MG IM SOLR: 5.0000 mg | Freq: Four times a day (QID) | INTRAMUSCULAR | Status: DC | PRN

## 2022-08-12 MED ORDER — FOLIC ACID 1 MG PO TABS
1.0000 mg | ORAL_TABLET | Freq: Every day | ORAL | Status: DC
Start: 2022-08-12 — End: 2022-08-12

## 2022-08-12 MED ORDER — LORAZEPAM 1 MG PO TABS
1.0000 mg | ORAL_TABLET | ORAL | Status: AC | PRN
  Administered 2022-08-13: 3 mg via ORAL
  Administered 2022-08-14 (×3): 2 mg via ORAL
  Administered 2022-08-14 (×2): 3 mg via ORAL
  Administered 2022-08-14: 2 mg via ORAL
  Filled 2022-08-12: qty 2
  Filled 2022-08-12: qty 1
  Filled 2022-08-12: qty 3
  Filled 2022-08-12: qty 2
  Filled 2022-08-12: qty 1
  Filled 2022-08-12: qty 2
  Filled 2022-08-12 (×2): qty 3

## 2022-08-12 MED ORDER — CELECOXIB 100 MG PO CAPS: 100.0000 mg | ORAL_CAPSULE | Freq: Two times a day (BID) | ORAL | 0 refills | Status: DC

## 2022-08-12 NOTE — Progress Notes (Addendum)
  Subjective: Pt stable - pain somewhat controlled - no sob   Objective: Vital signs in last 24 hours: Temp:  [98.8 F (37.1 C)] 98.8 F (37.1 C) (04/20 0744) Pulse Rate:  [90-97] 90 (04/20 0744) Resp:  [17-18] 18 (04/20 0744) BP: (114-145)/(73-96) 114/96 (04/20 0744) SpO2:  [85 %-86 %] 86 % (04/20 0744)  Intake/Output from previous day: 04/19 0701 - 04/20 0700 In: 240 [P.O.:240] Out: -  Intake/Output this shift: No intake/output data recorded.  Exam:  Intact pulses distally No cellulitis present Compartment soft  Labs: No results for input(s): "HGB" in the last 72 hours. No results for input(s): "WBC", "RBC", "HCT", "PLT" in the last 72 hours. No results for input(s): "NA", "K", "CL", "CO2", "BUN", "CREATININE", "GLUCOSE", "CALCIUM" in the last 72 hours. No results for input(s): "LABPT", "INR" in the last 72 hours.  Assessment/Plan: Plan for dc sunday - o2 sat 86 ra - block wearing off so phrenic nerve should be recovering function F/u 2 weeks   G Dorene Grebe 08/12/2022, 8:53 AM

## 2022-08-12 NOTE — Significant Event (Addendum)
Rapid Response Event Note   Reason for Call :  New onset confusion, bilateral tremors  Initial Focused Assessment:  Patient alert to self at this time. Patient without distress at time of arrival. Skin warm and dry. Heart tones fast, lungs diminished.   VS 232/113 (142) HR 114 RR 19 O2 95% 3L [place]  Interventions:  -NIHSS 3: LOC questions and aphasia, no focal deficits--discussed with Neuro MD Lindzen, no need for CTH at this time -CIWA 10: pt endorses drinking 1/5th bourbon daily of [place] -Trend vitals -Page MD -ativan IV  -consult to medicine team  Plan of Care:  Trend vitals, document and treat CIWA per protocol. Call back for further needs.  VS 147/87 (103) *labetalol IV  not needed HR 109 RR 17 O2 93% 3L [place]  Event Summary:  MD Notified: Ortho Care On call Call Time: 1506 Arrival Time: 1510 End Time: 1550  Truddie Crumble, RN

## 2022-08-12 NOTE — Consult Note (Signed)
Initial Consultation Note   Patient: [name] ZOX:[id number] DOB: 1966/02/25 PCP: [name], MD DOA: 08/10/2022 DOS: the patient was seen and examined on 08/12/2022 Primary service: Cammy Copa, MD  Referring physician: Dr. Corrie Mckusick Reason for consult: Altered mentations suspicious for alcohol withdrawal   Assessment and Plan: No notes have been filed under this hospital service. Service: Hospitalist  1.  Acute delirium, agreed with primary team this is most likely compatible with alcohol withdrawal.  With acute onset of confusion, agitation and elevated CIWA score including blood pressure 48-72 hours after last drink with a baseline IV liquor drinker. -Continue CIWA protocol -Add as needed Zyprexa for breakthrough agitations -Other DDx, check CBC BMP UA and chest x-ray.  Will hold off brain imaging for now, if mentation not improving overnight, consider CT head tomorrow morning.  Discussed with orthopedic surgery nurse at bedside.  Polypharmacy is possible but less likely, looks like patient on multiple CNS medications including 4 different SSRI and SNRI, Klonopin and he has been taking around-the-clock oxycodone every 6 hours for last 2 days.  Will decrease Klonopin dosage to half. Avoid hypoglycemia, discontinue standing dose of 3 times daily AC NovoLog, keep sliding scale.  2.  HTN urgency, likely secondary to alcohol withdrawal, agreed with as needed BP meds for now.  Resume Coreg if able to  3.  Right shoulder replacement, as per primary team, consider alternate narcotic with NSAIDs if possible.   TRH will continue to follow the patient.  HPI: [name] is a 57 y.o. male with past medical history of alcohol abuse, right rotator cuff tear status post repair in November presented to hospital for sudden onset pain of right shoulder was found to have tearing of the right rotator cuff tendon and retraction of supraspinatus and infraspinatus muscle.  Patient  underwent right reverse shoulder replacement on 08/10/2022.  Chronically, patient drinks 1/5-1/4 bottle of bourbon whiskey every day and last drink was 3 days ago.  This afternoon, around 3 AM, patient became confused disoriented to time and place and agitated request a alcoholic drink.  Vital signs blood pressure significant elevated with SBP more than 200, tachycardia, with a CIWA score of 13.  RRT was called and initial evaluation pointed to some concern about stroke development, neurology saw the patient and code stroke was discontinued.  After treatment of 2 doses of IV Ativan, patient mentation stabilized and blood pressure also improved.  Review of Systems: Unable to review all systems due to lack of cooperation from patient. 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   Anxiety    Anxiety and depression    Arthritis    knees and shoulders   Benzodiazepine withdrawal with complication 01/09/2019   Bruit    L   Chest pain    hx   Cognitive complaints with normal neuropsychological exam 10/23/2016   Diabetes mellitus without complication    "borderline", diet controlled, no meds, patient has lost 30 lbs   Edema    Fatty liver    GERD (gastroesophageal reflux disease)    uses Omeprazole   Goiter    HLD (hyperlipidemia)    Hypertension    essential, benign   Hypothyroidism    Impotence of organic origin    Murmur    never has caused any problems   Neuromuscular disorder    neuropathy bilateral feet   Other chest pain  tightness, pressure   Palpitation    hx   Precordial pain    Sleep apnea    uses cpap   Past Surgical History:  Procedure Laterality Date   APPENDECTOMY  2002   done at [facility]   CARDIAC CATHETERIZATION     CARDIAC CATHETERIZATION N/A 08/27/2015   Procedure: Left Heart Cath and Coronary Angiography;  Surgeon: Dolores Patty, MD;  Location: [facility] INVASIVE CV LAB;   Service: Cardiovascular;  Laterality: N/A;   COLONOSCOPY  in his 20's   COLONOSCOPY  2019   EAR CYST EXCISION Left 07/08/2019   Procedure: open excision baker's cyst left knee;  Surgeon: Cammy Copa, MD;  Location: [facility] OR;  Service: Orthopedics;  Laterality: Left;   HERNIA REPAIR  05/15/2013   umbilical   JOINT REPLACEMENT     right knee   KNEE ARTHROSCOPY Left 12/17/2018   Procedure: left knee arthroscopy, meniscal debridement, loose body removal;  Surgeon: Cammy Copa, MD;  Location: [facility] OR;  Service: Orthopedics;  Laterality: Left;   REVERSE SHOULDER ARTHROPLASTY Right 08/10/2022   Procedure: RIGHT REVERSE SHOULDER ARTHROPLASTY, BICEPS TENODESIS;  Surgeon: Cammy Copa, MD;  Location: [facility] OR;  Service: Orthopedics;  Laterality: Right;   REVISION TOTAL KNEE ARTHROPLASTY Right 06/23/2014   DR [name]   SHOULDER ARTHROSCOPY WITH ROTATOR CUFF REPAIR AND SUBACROMIAL DECOMPRESSION Right 03/07/2022   Procedure: RIGHT SHOULDER ARTHROSCOPY, DEBRIDEMENT, MINI OPEN ROTATOR CUFF TEAR REPAIR;  Surgeon: Cammy Copa, MD;  Location: [facility] OR;  Service: Orthopedics;  Laterality: Right;   TONSILLECTOMY     TOTAL KNEE ARTHROPLASTY Right 2011   right   TOTAL KNEE ARTHROPLASTY Left 02/10/2021   Procedure: LEFT TOTAL KNEE ARTHROPLASTY;  Surgeon: Cammy Copa, MD;  Location: [facility] OR;  Service: Orthopedics;  Laterality: Left;   TOTAL KNEE REVISION Right 06/23/2014   Procedure: TOTAL KNEE REVISION;  Surgeon: Cammy Copa, MD;  Location: [facility] OR;  Service: Orthopedics;  Laterality: Right;   UPPER GASTROINTESTINAL ENDOSCOPY  04/30/2013   Demetrius Charity TOOTH EXTRACTION     Social History:  reports that he quit smoking about 17 years ago. His smoking use included cigarettes. He has a 36.00 pack-year smoking history. He has never used smokeless tobacco. He reports current alcohol use of about 4.0 standard drinks of alcohol per week. He reports that he does not use  drugs.  Allergies  Allergen Reactions   Ambien [Zolpidem] Swelling and Other (See Comments)    Swelling in the throat    Family History  Problem Relation Age of Onset   Heart disease Father    Colon cancer Neg Hx    Colon polyps Neg Hx    Esophageal cancer Neg Hx    Rectal cancer Neg Hx    Stomach cancer Neg Hx     Prior to Admission medications   Medication Sig Start Date End Date Taking? Authorizing Provider  ARIPiprazole (ABILIFY) 5 MG tablet Take one tab at bed time 07/07/22  Yes Arfeen, Phillips Grout, MD  atorvastatin (LIPITOR) 80 MG tablet Take 1 tablet (80 mg total) by mouth daily. 07/26/22  Yes [name], MD  buPROPion (WELLBUTRIN XL) 300 MG 24 hr tablet Take 1 tablet (300 mg total) by mouth daily. 07/07/22  Yes Arfeen, Phillips Grout, MD  busPIRone (BUSPAR) 15 MG tablet Take 1 tablet (15 mg total) by mouth 3 (three) times daily. 07/26/22  Yes [name], MD  carvedilol (COREG) 12.5 MG tablet Take  1 tablet (12.5 mg total) by mouth 2 (two) times daily. 04/25/22  Yes Gollan, Tollie Pizza, MD  clonazePAM (KLONOPIN) 2 MG tablet TAKE 1/2 (ONE-HALF) TABLET BY MOUTH TWICE DAILY AS NEEDED FOR ANXIETY 07/26/22  Yes [name], MD  dicyclomine (BENTYL) 20 MG tablet Take 1 tablet (20 mg total) by mouth every 6 (six) hours. 07/26/22  Yes [name], MD  empagliflozin (JARDIANCE) 10 MG TABS tablet Take 10 mg by mouth daily.   Yes [name], MD  ezetimibe (ZETIA) 10 MG tablet Take 1 tablet (10 mg total) by mouth daily. 07/26/22  Yes [name], MD  furosemide (LASIX) 20 MG tablet Take 1 tablet (20 mg total) by mouth 2 (two) times a week. 10/03/21  Yes [name], MD  gabapentin (NEURONTIN) 300 MG capsule 3 tablets in the morning and evening ,  2 tablets at night 05/07/22  Yes [name], MD  HYDROcodone-acetaminophen (NORCO) 10-325 MG tablet Take 1 tablet by mouth 2 (two) times daily as needed. 07/26/22  Yes [name], MD  ibuprofen (ADVIL) 200 MG tablet Take 400 mg by mouth  every 6 (six) hours as needed for moderate pain.   Yes [provider]  isosorbide mononitrate (IMDUR) 30 MG 24 hr tablet Take 1 tablet (30 mg total) by mouth daily. 04/25/22  Yes Antonieta Iba, MD  levothyroxine (SYNTHROID) 88 MCG tablet Take 1 tablet (88 mcg total) by mouth daily before breakfast. 04/19/22  Yes [name], MD  losartan (COZAAR) 100 MG tablet Take 1 tablet by mouth once daily 02/15/22  Yes [name], MD  metFORMIN (GLUCOPHAGE) 500 MG tablet TAKE 1 TABLET BY MOUTH TWICE DAILY WITH A MEAL 07/13/22  Yes [name], MD  methocarbamol (ROBAXIN) 500 MG tablet Take 1 tablet (500 mg total) by mouth every 8 (eight) hours as needed. 05/26/22  Yes [name], MD  Multiple Vitamin (MULTIVITAMIN WITH MINERALS) TABS tablet Take 1 tablet by mouth daily.   Yes [provider]  naloxone [facility]) nasal spray 4 mg/0.1 mL Use as needed for excessive sedation /opioid overdose 09/30/21  Yes [name], MD  omeprazole (PRILOSEC) 40 MG capsule TAKE 1 CAPSULE BY MOUTH IN THE MORNING AND 1 CAPSULE IN THE EVENING 06/09/22  Yes [name], MD  traZODone (DESYREL) 100 MG tablet TAKE 1 & 1/2 (ONE & ONE-HALF) TABLETS BY MOUTH AT BEDTIME 04/26/22  Yes [name], MD  aspirin EC 81 MG tablet Take 1 tablet (81 mg total) by mouth daily at 6 PM. Swallow whole. 08/12/22   Cammy Copa, MD  Blood Glucose Monitoring Suppl (ACCU-CHEK AVIVA PLUS) w/Device KIT Use to check blood sugars up to 4 times daily. ICD-10 E11.40 10/04/21   [name], MD  celecoxib (CELEBREX) 100 MG capsule Take 1 capsule (100 mg total) by mouth 2 (two) times daily. 08/12/22   Cammy Copa, MD  docusate sodium (COLACE) 100 MG capsule Take 1 capsule (100 mg total) by mouth 2 (two) times daily. 08/12/22   Cammy Copa, MD  doxycycline (VIBRA-TABS) 100 MG tablet Take 1 tablet (100 mg total) by mouth every 12 (twelve) hours. 08/12/22   Cammy Copa, MD  glucose blood (ACCU-CHEK  AVIVA PLUS) test strip Use to check blood sugars up to 4 times daily. ICD-10 E11.40 01/17/22   [name], MD  Lancets (ACCU-CHEK MULTICLIX) lancets Use to to check blood sugars up to  4 times daily. ICD-10: E11.40 10/04/21   [name], MD  oxyCODONE (OXY IR/ROXICODONE) 5 MG immediate release tablet Take 1 tablet (5 mg total) by mouth every 4 (four) hours as needed for moderate pain (pain score 4-6). 08/12/22   Cammy Copa, MD  promethazine (PHENERGAN) 12.5 MG tablet Take 1 tablet (12.5 mg total) by mouth every 4 (four) hours as needed for nausea or vomiting. Patient taking differently: Take 12.5 mg by mouth every 8 (eight) hours as needed for nausea or vomiting. 09/09/20   [name], MD    Physical Exam: Vitals:   08/12/22 1505 08/12/22 1522 08/12/22 1551 08/12/22 1600  BP: (!) 196/114 (!) 205/101 (!) 147/87 (!) 153/85  Pulse: (!) 114 (!) 115 (!) 106 (!) 109  Resp:    16  Temp:      TempSrc:      SpO2: 90% (!) 87%  93%  Weight:      Height:       Subjective:  Eyes: PERRL, lids and conjunctivae normal ENMT: Mucous membranes are moist. Posterior pharynx clear of any exudate or lesions.Normal dentition.  Neck: normal, supple, no masses, no thyromegaly Respiratory: clear to auscultation bilaterally, no wheezing, no crackles. Normal respiratory effort. No accessory muscle use.  Cardiovascular: Regular rate and rhythm, no murmurs / rubs / gallops. No extremity edema. 2+ pedal pulses. No carotid bruits.  Abdomen: no tenderness, no masses palpated. No hepatosplenomegaly. Bowel sounds positive.  Musculoskeletal: no clubbing / cyanosis. No joint deformity upper and lower extremities. Good ROM, no contractures. Normal muscle tone.  Skin: no rashes, lesions, ulcers. No induration Neurologic: CN 2-12 grossly intact. Sensation intact, DTR normal.  Slight weakness on left side compared to right side Psychiatric: Awake, oriented to himself, confused about time and place Data  Reviewed:   There are no new results to review at this time.    Family Communication: None at bedside Primary team communication: Orthopedic team Thank you very much for involving [place] in the care of your patient.  Author: Emeline General, MD 08/12/2022 5:42 PM  For on call review [email or web address].

## 2022-08-12 NOTE — Progress Notes (Signed)
Chest xray showed RLL atelectasis, please enforce incentive spirometry use  CBC, BMP largely normal limits.  UA pending.  BP stabilized.

## 2022-08-12 NOTE — Progress Notes (Signed)
Updated son [name] of patient condition.

## 2022-08-12 NOTE — Progress Notes (Signed)
Patient ID: [name], male   DOB: July 16, 1965, 57 y.o.   MRN: [id number] I was called about the patient as an update about his confusion and now severe hypertension.  He is 3 days into hospitalization from a reverse shoulder replacement.  Apparently he does drink significantly on a daily basis according to nursing.  It sounds like from the rapid response team that he is going into withdrawals.  He is also having a significantly elevated blood pressure.  We will reach out to the hospitalist service for consultation to address the BP.  He is on several different blood pressure medications and rapid responses recommended at least a dose of labetalol for the short-term.  He will also be started on a withdrawal protocol.

## 2022-08-12 NOTE — Progress Notes (Signed)
Called into patient room due to bed alarm going off.  Patient confused and taking off CPAP, arm out of sling, removing O2 probe, trying to get out of bed. Tremors.  Only oriented x 1.  BP 230's systolic. Called rapid response. FSBS obtained 99.  Rapid response at bedside. Paged attending. Orders received.

## 2022-08-12 NOTE — Progress Notes (Signed)
Placed sat probe on forehead, 95% RA

## 2022-08-12 NOTE — Progress Notes (Signed)
Patient satting 82%. Sleeping.  Replaced pulse ox.  Placed patient on 3L [place] with Cpap.  Respiratory notified. Sats to 91%.

## 2022-08-13 DIAGNOSIS — F10931 Alcohol use, unspecified with withdrawal delirium: Secondary | ICD-10-CM

## 2022-08-13 DIAGNOSIS — Z96611 Presence of right artificial shoulder joint: Secondary | ICD-10-CM | POA: Diagnosis not present

## 2022-08-13 HISTORY — DX: Alcohol use, unspecified with withdrawal delirium: F10.931

## 2022-08-13 LAB — GLUCOSE, CAPILLARY
Glucose-Capillary: 93 mg/dL (ref 70–99)
Glucose-Capillary: 104 mg/dL — ABNORMAL HIGH (ref 70–99)
Glucose-Capillary: 111 mg/dL — ABNORMAL HIGH (ref 70–99)
Glucose-Capillary: 101 mg/dL — ABNORMAL HIGH (ref 70–99)

## 2022-08-13 LAB — URINALYSIS, ROUTINE W REFLEX MICROSCOPIC
Bacteria, UA: NONE SEEN
Bilirubin Urine: NEGATIVE
Glucose, UA: 50 mg/dL — AB
Hgb urine dipstick: NEGATIVE
Ketones, ur: 20 mg/dL — AB
Leukocytes,Ua: NEGATIVE
Nitrite: NEGATIVE
Protein, ur: 30 mg/dL — AB
Specific Gravity, Urine: 1.016 (ref 1.005–1.030)
pH: 5 (ref 5.0–8.0)

## 2022-08-13 NOTE — Progress Notes (Signed)
PROGRESS NOTE    [name]  ZOX:[id number] DOB: 05-Mar-1966 DOA: 08/10/2022 PCP: [name], MD   Brief Narrative:  57 y.o. male with past medical history of alcohol abuse, right rotator cuff tear status post repair in November underwent right reverse shoulder replacement on 08/10/2022 by orthopedics.  TRH was consulted on 08/12/2022 after patient became disoriented, agitated and was found to be hypotensive, tachycardic.  There was a concern about stroke but neurology saw the patient and code stroke was discontinued.  He was treated with IV Ativan and started on CIWA protocol.  Assessment & Plan:   Possible alcohol withdrawal causing acute delirium -Improving.  Continue CIWA protocol with Ativan along with multivitamin, thiamine, folic acid.  Continue as needed Zyprexa for severe agitation. -Awake and answers some questions this morning. -Monitor mental status.  Patient is on multiple antidepressants/antipsychotics and on pain meds as well.  Hypertensive urgency -Possibly from alcohol withdrawal.  Blood pressure still elevated but improving.  Continue Coreg, isosorbide mononitrate, losartan and as needed IV antihypertensives  Right reverse shoulder replacement -Management as per primary orthopedics team  Normocytic anemia -Possibly from chronic illnesses.  Hemoglobin stable  Thrombocytopenia -Mild.  No labs today.  Hyponatremia -Mild. No labs today.  Repeat a.m. labs.  Anxiety and depression--continue current antidepressant/antipsychotic.  Outpatient follow-up with psychiatry  Diabetes mellitus type 2 -Continue CBGs with SSI.  Carb modified diet  Hypothyroidism -Continue levothyroxine  Hyperlipidemia -Continue ezetimibe    Subjective: Patient seen and examined at bedside.  Awake, answers some questions, slow to respond.  No fever, vomiting, seizures reported.  Objective: Vitals:   08/13/22 0500 08/13/22 0515 08/13/22 0611 08/13/22 0717  BP:  (!) 207/86 (!)  165/82 (!) 183/81  Pulse: 92   94  Resp:   19 (!) 24  Temp:  98.7 F (37.1 C) 98.8 F (37.1 C) 98 F (36.7 C)  TempSrc:  Oral  Oral  SpO2: 98%   94%  Weight:      Height:        Intake/Output Summary (Last 24 hours) at 08/13/2022 1050 Last data filed at 08/13/2022 0315 Gross per 24 hour  Intake 300 ml  Output 550 ml  Net -250 ml   Filed Weights   08/10/22 0547  Weight: 104.3 kg    Examination:  General exam: Appears calm and comfortable.  On room air.  Looks chronically ill and deconditioned. Respiratory system: Bilateral decreased breath sounds at bases with intermittent tachypnea Cardiovascular system: S1 & S2 heard, Rate controlled Gastrointestinal system: Abdomen is nondistended, soft and nontender. Normal bowel sounds heard. Extremities: No cyanosis, clubbing, edema.  Right shoulder dressing present Central nervous system: Awake, slow to respond.  Poor historian.  No focal neurological deficits. Moving extremities Skin: No rashes, lesions or ulcers Psychiatry: Currently not agitated.  Flat affect.    Data Reviewed: I have personally reviewed following labs and imaging studies  CBC: Recent Labs  Lab 08/12/22 1643  WBC 6.0  HGB 11.0*  HCT 33.8*  MCV 95.2  PLT 135*   Basic Metabolic Panel: Recent Labs  Lab 08/12/22 1643  NA 133*  K 4.3  CL 99  CO2 24  GLUCOSE 105*  BUN 17  CREATININE 1.14  CALCIUM 8.2*   GFR: Estimated Creatinine Clearance: 89.3 mL/min (by C-G formula based on SCr of 1.14 mg/dL). Liver Function Tests: No results for input(s): "AST", "ALT", "ALKPHOS", "BILITOT", "PROT", "ALBUMIN" in the last 168 hours. No results for input(s): "LIPASE", "AMYLASE" in the  last 168 hours. No results for input(s): "AMMONIA" in the last 168 hours. Coagulation Profile: No results for input(s): "INR", "PROTIME" in the last 168 hours. Cardiac Enzymes: No results for input(s): "CKTOTAL", "CKMB", "CKMBINDEX", "TROPONINI" in the last 168 hours. BNP (last 3  results) No results for input(s): "PROBNP" in the last 8760 hours. HbA1C: No results for input(s): "HGBA1C" in the last 72 hours. CBG: Recent Labs  Lab 08/12/22 1139 08/12/22 1504 08/12/22 1752 08/12/22 2011 08/13/22 0716  GLUCAP 98 99 99 89 93   Lipid Profile: No results for input(s): "CHOL", "HDL", "LDLCALC", "TRIG", "CHOLHDL", "LDLDIRECT" in the last 72 hours. Thyroid Function Tests: No results for input(s): "TSH", "T4TOTAL", "FREET4", "T3FREE", "THYROIDAB" in the last 72 hours. Anemia Panel: No results for input(s): "VITAMINB12", "FOLATE", "FERRITIN", "TIBC", "IRON", "RETICCTPCT" in the last 72 hours. Sepsis Labs: No results for input(s): "PROCALCITON", "LATICACIDVEN" in the last 168 hours.  No results found for this or any previous visit (from the past 240 hour(s)).       Radiology Studies: DG Chest 1 View  Result Date: 08/12/2022 CLINICAL DATA:  Altered mental status EXAM: CHEST  1 VIEW COMPARISON:  None Available. FINDINGS: Enlarged cardiopericardial silhouette with vascular congestion. There is poor inflation. Mild opacity along the minor fissure. No pneumothorax. Overlapping cardiac leads. Right shoulder arthroplasty. The left inferior costophrenic angle is clipped off the edge of the film IMPRESSION: Poor inflation with enlarged cardiopericardial silhouette vascular congestion. Mild opacity along the margin of the minor fissure. Recommend follow-up. If needed a repeat x-ray with improved inflation could be considered as clinically appropriate Electronically Signed   By: Karen Kays M.D.   On: 08/12/2022 19:30        Scheduled Meds:  ARIPiprazole  5 mg Oral QHS   aspirin EC  81 mg Oral q1800   buPROPion  300 mg Oral Daily   busPIRone  15 mg Oral TID   carvedilol  12.5 mg Oral BID   celecoxib  100 mg Oral BID   dicyclomine  20 mg Oral Q6H   docusate sodium  100 mg Oral BID   doxycycline  100 mg Oral Q12H   ezetimibe  10 mg Oral Daily   folic acid  1 mg Oral  Daily   furosemide  20 mg Oral Once per day on Mon Thu   insulin aspart  0-15 Units Subcutaneous TID WC   insulin aspart  0-5 Units Subcutaneous QHS   isosorbide mononitrate  30 mg Oral Daily   labetalol  10 mg Intravenous Once   levothyroxine  88 mcg Oral Q0600   losartan  100 mg Oral Daily   metFORMIN  500 mg Oral BID WC   multivitamin with minerals  1 tablet Oral Daily   pantoprazole  80 mg Oral Daily   thiamine  100 mg Oral Daily   Or   thiamine  100 mg Intravenous Daily   Continuous Infusions:  sodium chloride 75 mL/hr at 08/13/22 0520   methocarbamol (ROBAXIN) IV            Glade Lloyd, MD [facility] 08/13/2022, 10:50 AM

## 2022-08-13 NOTE — Plan of Care (Signed)
  Problem: Skin Integrity: Goal: Risk for impaired skin integrity will decrease Outcome: Progressing   Problem: Education: Goal: Knowledge of General Education information will improve Description: Including pain rating scale, medication(s)/side effects and non-pharmacologic comfort measures Outcome: Progressing   Problem: Activity: Goal: Risk for activity intolerance will decrease Outcome: Progressing   Problem: Nutrition: Goal: Adequate nutrition will be maintained Outcome: Progressing   Problem: Coping: Goal: Level of anxiety will decrease Outcome: Progressing   Problem: Pain Managment: Goal: General experience of comfort will improve Outcome: Progressing   Problem: Safety: Goal: Ability to remain free from injury will improve Outcome: Progressing

## 2022-08-13 NOTE — Progress Notes (Signed)
Patient ID: [name], male   DOB: 05/17/1965, 57 y.o.   MRN: [id number] The patient needed to stay yesterday secondary to showing signs of going on to withdrawal from alcohol use.  Also, his blood pressure was quite high.  [facility] consulted and Dr. Chipper Herb did see the patient.  We certainly appreciate his consultation.  This morning, the patient does follow commands appropriately but he does appear fatigued.  His systolic blood pressure is in the 180s.  He understands why he is being kept here in terms of following his withdrawal symptoms combined with needing to him get his blood pressure under control.  His right shoulder is swollen to be expected.  There is no redness.  His Aquacel dressing shows some scant bloody drainage.

## 2022-08-13 NOTE — Plan of Care (Signed)
  Problem: Education: Goal: Knowledge of the prescribed therapeutic regimen will improve Outcome: Not Progressing Goal: Understanding of activity limitations/precautions following surgery will improve Outcome: Not Progressing   Problem: Education: Goal: Understanding of activity limitations/precautions following surgery will improve Outcome: Not Progressing

## 2022-08-14 DIAGNOSIS — F10931 Alcohol use, unspecified with withdrawal delirium: Secondary | ICD-10-CM | POA: Diagnosis not present

## 2022-08-14 DIAGNOSIS — Z96611 Presence of right artificial shoulder joint: Secondary | ICD-10-CM | POA: Diagnosis not present

## 2022-08-14 LAB — CBC WITH DIFFERENTIAL/PLATELET
Abs Immature Granulocytes: 0.02 10*3/uL (ref 0.00–0.07)
Basophils Absolute: 0 10*3/uL (ref 0.0–0.1)
Basophils Relative: 1 %
Eosinophils Absolute: 0.3 10*3/uL (ref 0.0–0.5)
Eosinophils Relative: 6 %
HCT: 28.1 % — ABNORMAL LOW (ref 39.0–52.0)
Hemoglobin: 9.4 g/dL — ABNORMAL LOW (ref 13.0–17.0)
Immature Granulocytes: 1 %
Lymphocytes Relative: 12 %
Lymphs Abs: 0.5 10*3/uL — ABNORMAL LOW (ref 0.7–4.0)
MCH: 31.2 pg (ref 26.0–34.0)
MCHC: 33.5 g/dL (ref 30.0–36.0)
MCV: 93.4 fL (ref 80.0–100.0)
Monocytes Absolute: 0.5 10*3/uL (ref 0.1–1.0)
Monocytes Relative: 12 %
Neutro Abs: 2.9 10*3/uL (ref 1.7–7.7)
Neutrophils Relative %: 68 %
Platelets: 155 10*3/uL (ref 150–400)
RBC: 3.01 MIL/uL — ABNORMAL LOW (ref 4.22–5.81)
RDW: 13.5 % (ref 11.5–15.5)
WBC: 4.1 10*3/uL (ref 4.0–10.5)
nRBC: 0 % (ref 0.0–0.2)

## 2022-08-14 LAB — GLUCOSE, CAPILLARY
Glucose-Capillary: 91 mg/dL (ref 70–99)
Glucose-Capillary: 109 mg/dL — ABNORMAL HIGH (ref 70–99)
Glucose-Capillary: 86 mg/dL (ref 70–99)
Glucose-Capillary: 92 mg/dL (ref 70–99)
Glucose-Capillary: 69 mg/dL — ABNORMAL LOW (ref 70–99)
Glucose-Capillary: 86 mg/dL (ref 70–99)

## 2022-08-14 LAB — COMPREHENSIVE METABOLIC PANEL
ALT: 12 U/L (ref 0–44)
AST: 18 U/L (ref 15–41)
Albumin: 2.5 g/dL — ABNORMAL LOW (ref 3.5–5.0)
Alkaline Phosphatase: 68 U/L (ref 38–126)
Anion gap: 8 (ref 5–15)
BUN: 15 mg/dL (ref 6–20)
CO2: 28 mmol/L (ref 22–32)
Calcium: 7.9 mg/dL — ABNORMAL LOW (ref 8.9–10.3)
Chloride: 101 mmol/L (ref 98–111)
Creatinine, Ser: 0.92 mg/dL (ref 0.61–1.24)
GFR, Estimated: >60 mL/min (ref >60)
Glucose, Bld: 97 mg/dL (ref 70–99)
Potassium: 3.7 mmol/L (ref 3.5–5.1)
Sodium: 137 mmol/L (ref 135–145)
Total Bilirubin: 0.7 mg/dL (ref 0.3–1.2)
Total Protein: 5.3 g/dL — ABNORMAL LOW (ref 6.5–8.1)

## 2022-08-14 LAB — MAGNESIUM: Magnesium: 1.9 mg/dL (ref 1.7–2.4)

## 2022-08-14 MED ORDER — CARVEDILOL 25 MG PO TABS
25.0000 mg | ORAL_TABLET | Freq: Two times a day (BID) | ORAL | Status: DC
  Administered 2022-08-14 – 2022-08-16 (×5): 25 mg via ORAL
  Filled 2022-08-14 (×5): qty 1

## 2022-08-14 MED ORDER — CHLORDIAZEPOXIDE HCL 25 MG PO CAPS
25.0000 mg | ORAL_CAPSULE | Freq: Three times a day (TID) | ORAL | Status: AC
  Administered 2022-08-14 (×3): 25 mg via ORAL
  Filled 2022-08-14 (×3): qty 1

## 2022-08-14 NOTE — Progress Notes (Signed)
Pt remains confused overnight, CIWA scores elevated. Pt requiring Ativan per CIWA multiple times. Pt requiring 2-3LNC. Bed alarm remains on. Pt removing telemetry often and using R arm, taking out of sling multiple times. Pt reoriented frequently and reminded to limit use or R arm/shoulder, arm placed back in sling. New IV placed overnight as pt removed previous IV. IVF has been infusing overnight, pt remained continent. No BM. No difficulty with swallowing or taking medications/drinking water.

## 2022-08-14 NOTE — Progress Notes (Signed)
  Subjective: [name] is a 57 y.o. male s/p right RSA.  They are POD 4.  Pt's pain is controlled.  Patient denies any complaints of chest pain, shortness of breath, abdominal pain.  He is undergoing treatment for alcohol withdrawal with CIWA protocol.  His speech is slurred intermittently but he is able to respond to questioning and follow commands.  States that shoulder pain is controlled.  He reports that the dyspnea that he had on Friday has improved  Objective: Vital signs in last 24 hours: Temp:  [97.9 F (36.6 C)-98.7 F (37.1 C)] 98.7 F (37.1 C) (04/22 1227) Pulse Rate:  [71-100] 78 (04/22 1227) Resp:  [18-20] 18 (04/22 1227) BP: (140-197)/(75-165) 163/89 (04/22 1227) SpO2:  [88 %-100 %] 98 % (04/22 1227)  Intake/Output from previous day: 04/21 0701 - 04/22 0700 In: 2262 [P.O.:240; I.V.:2022] Out: 3025 [Urine:3025] Intake/Output this shift: Total I/O In: 338.8 [P.O.:120; I.V.:218.8] Out: -   Exam:  No gross blood or drainage overlying the dressing.  Scant bloody drainage over the anterior aspect of the incision 2+ radial pulse of the operative extremity Intact EPL, FPL, finger abduction, grip strength, bicep flexion, tricep extension, deltoid function of the right upper extremity.  Axillary nerve is intact with deltoid firing.  He has no calf tenderness bilaterally.  Negative Homans' sign bilaterally.   Labs: Recent Labs    08/12/22 1643 08/14/22 0352  HGB 11.0* 9.4*   Recent Labs    08/12/22 1643 08/14/22 0352  WBC 6.0 4.1  RBC 3.55* 3.01*  HCT 33.8* 28.1*  PLT 135* 155   Recent Labs    08/12/22 1643 08/14/22 0352  NA 133* 137  K 4.3 3.7  CL 99 101  CO2 24 28  BUN 17 15  CREATININE 1.14 0.92  GLUCOSE 105* 97  CALCIUM 8.2* 7.9*   No results for input(s): "LABPT", "INR" in the last 72 hours.  Assessment/Plan: Pt is POD 4 s/p right RSA    -Plan to discharge when cleared by medical team.  Appreciate their assistance with managing withdrawal  symptoms.  Okay to discharge from shoulder standpoint whenever he is ready.  -No lifting with the operative arm  -Aspirin for DVT prophylaxis.  No sign of DVT/PE at this point.  -Follow-up with Dr. [name] in clinic 2 weeks postoperatively     [facility] 08/14/2022, 12:33 PM

## 2022-08-14 NOTE — Care Management Important Message (Signed)
Important Message  Patient Details  Name: [name] MRN: [id number] Date of Birth: [birth year]   Medicare Important Message Given:  Yes     [name] 08/14/2022, 1:02 PM

## 2022-08-14 NOTE — Progress Notes (Signed)
This RN responded to bed alarm and found patient getting back into bed. Patient had managed to stand, walk to the bathroom, urinate on the floor and walk back to the bed. Patient with very unsteady gait, confusion and insisting he now needs to "treat the pool water." Patient back in bed and bathed, linens changed and PRN ativan given. Safety concern expressed to MD and suggest for higher level of care. Transfer orders placed by MD for progressive unit.

## 2022-08-14 NOTE — Progress Notes (Addendum)
Patient's blood sugar is 69. Informed on call hospitalist. Orders received. Hypoglycemic protocol started.  Recheck blood sugar at 9:40 p.m. Blood sugar is 86

## 2022-08-14 NOTE — Progress Notes (Signed)
PROGRESS NOTE    [name]  ZOX:[id number] DOB: 1965/05/09 DOA: 08/10/2022 PCP: [name], MD   Brief Narrative:  57 y.o. male with past medical history of alcohol abuse, right rotator cuff tear status post repair in November underwent right reverse shoulder replacement on 08/10/2022 by orthopedics.  TRH was consulted on 08/12/2022 after patient became disoriented, agitated and was found to be hypotensive, tachycardic.  There was a concern about stroke but neurology saw the patient and code stroke was discontinued.  He was treated with IV Ativan and started on CIWA protocol.  Assessment & Plan:   Possible alcohol withdrawal causing acute delirium -Continues to remain in withdrawal.  Continue CIWA protocol with Ativan along with multivitamin, thiamine, folic acid.  Will also add scheduled Librium.  Continue as needed Zyprexa for severe agitation. -Monitor mental status.  Patient is on multiple antidepressants/antipsychotics and on pain meds as well which is probably contributing to increased sedation.  Hypertensive urgency -Possibly from alcohol withdrawal.  Blood pressure still elevated but improving.  Continue isosorbide mononitrate, losartan and as needed IV antihypertensives.  Increase dose of Coreg to 25 mg twice a day.  Right reverse shoulder replacement -Management as per primary orthopedics team  Normocytic anemia -Possibly from chronic illnesses.  Hemoglobin stable  Thrombocytopenia -Resolved  Hyponatremia -Resolved  Anxiety and depression--continue current antidepressant/antipsychotic.  Outpatient follow-up with psychiatry  Diabetes mellitus type 2 -Continue CBGs with SSI.  Carb modified diet  Hypothyroidism -Continue levothyroxine  Hyperlipidemia -Continue ezetimibe    Subjective: Patient seen and examined at bedside.  Awake, answers some questions, slow to respond.  Nursing staff reports intermittent confusion overnight.  No fever, seizures, vomiting  reported.  Objective: Vitals:   08/13/22 2332 08/14/22 0500 08/14/22 0507 08/14/22 0600  BP:   (!) 159/80   Pulse: 71 76 84   Resp:   20   Temp:   97.9 F (36.6 C)   TempSrc:   Oral   SpO2: 96% (!) 88% 96% 94%  Weight:      Height:        Intake/Output Summary (Last 24 hours) at 08/14/2022 0739 Last data filed at 08/14/2022 0606 Gross per 24 hour  Intake 2262.04 ml  Output 3025 ml  Net -762.96 ml    Filed Weights   08/10/22 0547  Weight: 104.3 kg    Examination:  General: On 3 L oxygen via nasal.  No distress ENT/neck: No thyromegaly.  JVD is not elevated  respiratory: Decreased breath sounds at bases bilaterally with some crackles; intermittently tachypneic  CVS: S1-S2 heard, rate controlled currently Abdominal: Soft, nontender, slightly distended; no organomegaly, bowel sounds are heard Extremities: Trace lower extremity edema; no cyanosis  CNS: Awake extremity slow to respond.  Poor historian.  No focal neurologic deficit.  Moves extremities Lymph: No obvious lymphadenopathy Skin: No obvious ecchymosis/lesions  psych: No signs of agitation currently.  Extremely flat affect.   Musculoskeletal: Right shoulder dressing present.    Data Reviewed: I have personally reviewed following labs and imaging studies  CBC: Recent Labs  Lab 08/12/22 1643 08/14/22 0352  WBC 6.0 4.1  NEUTROABS  --  2.9  HGB 11.0* 9.4*  HCT 33.8* 28.1*  MCV 95.2 93.4  PLT 135* 155    Basic Metabolic Panel: Recent Labs  Lab 08/12/22 1643 08/14/22 0352  NA 133* 137  K 4.3 3.7  CL 99 101  CO2 24 28  GLUCOSE 105* 97  BUN 17 15  CREATININE 1.14 0.92  CALCIUM 8.2* 7.9*  MG  --  1.9    GFR: Estimated Creatinine Clearance: 110.6 mL/min (by C-G formula based on SCr of 0.92 mg/dL). Liver Function Tests: Recent Labs  Lab 08/14/22 0352  AST 18  ALT 12  ALKPHOS 68  BILITOT 0.7  PROT 5.3*  ALBUMIN 2.5*   No results for input(s): "LIPASE", "AMYLASE" in the last 168 hours. No  results for input(s): "AMMONIA" in the last 168 hours. Coagulation Profile: No results for input(s): "INR", "PROTIME" in the last 168 hours. Cardiac Enzymes: No results for input(s): "CKTOTAL", "CKMB", "CKMBINDEX", "TROPONINI" in the last 168 hours. BNP (last 3 results) No results for input(s): "PROBNP" in the last 8760 hours. HbA1C: No results for input(s): "HGBA1C" in the last 72 hours. CBG: Recent Labs  Lab 08/12/22 2011 08/13/22 0716 08/13/22 1133 08/13/22 1654 08/13/22 2140  GLUCAP 89 93 104* 111* 101*    Lipid Profile: No results for input(s): "CHOL", "HDL", "LDLCALC", "TRIG", "CHOLHDL", "LDLDIRECT" in the last 72 hours. Thyroid Function Tests: No results for input(s): "TSH", "T4TOTAL", "FREET4", "T3FREE", "THYROIDAB" in the last 72 hours. Anemia Panel: No results for input(s): "VITAMINB12", "FOLATE", "FERRITIN", "TIBC", "IRON", "RETICCTPCT" in the last 72 hours. Sepsis Labs: No results for input(s): "PROCALCITON", "LATICACIDVEN" in the last 168 hours.  No results found for this or any previous visit (from the past 240 hour(s)).       Radiology Studies: DG Chest 1 View  Result Date: 08/12/2022 CLINICAL DATA:  Altered mental status EXAM: CHEST  1 VIEW COMPARISON:  None Available. FINDINGS: Enlarged cardiopericardial silhouette with vascular congestion. There is poor inflation. Mild opacity along the minor fissure. No pneumothorax. Overlapping cardiac leads. Right shoulder arthroplasty. The left inferior costophrenic angle is clipped off the edge of the film IMPRESSION: Poor inflation with enlarged cardiopericardial silhouette vascular congestion. Mild opacity along the margin of the minor fissure. Recommend follow-up. If needed a repeat x-ray with improved inflation could be considered as clinically appropriate Electronically Signed   By: Karen Kays M.D.   On: 08/12/2022 19:30        Scheduled Meds:  ARIPiprazole  5 mg Oral QHS   aspirin EC  81 mg Oral q1800    buPROPion  300 mg Oral Daily   busPIRone  15 mg Oral TID   carvedilol  12.5 mg Oral BID   celecoxib  100 mg Oral BID   dicyclomine  20 mg Oral Q6H   docusate sodium  100 mg Oral BID   doxycycline  100 mg Oral Q12H   ezetimibe  10 mg Oral Daily   folic acid  1 mg Oral Daily   furosemide  20 mg Oral Once per day on Mon Thu   insulin aspart  0-15 Units Subcutaneous TID WC   insulin aspart  0-5 Units Subcutaneous QHS   isosorbide mononitrate  30 mg Oral Daily   labetalol  10 mg Intravenous Once   levothyroxine  88 mcg Oral Q0600   losartan  100 mg Oral Daily   metFORMIN  500 mg Oral BID WC   multivitamin with minerals  1 tablet Oral Daily   pantoprazole  80 mg Oral Daily   thiamine  100 mg Oral Daily   Or   thiamine  100 mg Intravenous Daily   Continuous Infusions:  sodium chloride 75 mL/hr at 08/14/22 1610   methocarbamol (ROBAXIN) IV            Glade Lloyd, MD [facility] 08/14/2022, 7:39 AM

## 2022-08-14 NOTE — Progress Notes (Signed)
Patient son Thayer Ohm set up password with this RN for privacy. No information to be released unless password provided per Thayer Ohm.

## 2022-08-15 DIAGNOSIS — Z96611 Presence of right artificial shoulder joint: Secondary | ICD-10-CM | POA: Diagnosis not present

## 2022-08-15 DIAGNOSIS — F10931 Alcohol use, unspecified with withdrawal delirium: Secondary | ICD-10-CM | POA: Diagnosis not present

## 2022-08-15 LAB — GLUCOSE, CAPILLARY
Glucose-Capillary: 89 mg/dL (ref 70–99)
Glucose-Capillary: 108 mg/dL — ABNORMAL HIGH (ref 70–99)
Glucose-Capillary: 115 mg/dL — ABNORMAL HIGH (ref 70–99)
Glucose-Capillary: 152 mg/dL — ABNORMAL HIGH (ref 70–99)

## 2022-08-15 MED ORDER — BOOST / RESOURCE BREEZE PO LIQD CUSTOM
1.0000 | Freq: Three times a day (TID) | ORAL | Status: DC
  Administered 2022-08-15 – 2022-08-16 (×3): 1 via ORAL

## 2022-08-15 MED ORDER — CHLORDIAZEPOXIDE HCL 25 MG PO CAPS
25.0000 mg | ORAL_CAPSULE | Freq: Two times a day (BID) | ORAL | Status: AC
  Administered 2022-08-15 (×2): 25 mg via ORAL
  Filled 2022-08-15 (×2): qty 1

## 2022-08-15 MED ORDER — HALOPERIDOL LACTATE 5 MG/ML IJ SOLN: 2.0000 mg | Freq: Four times a day (QID) | INTRAMUSCULAR | Status: DC | PRN

## 2022-08-15 MED ORDER — HALOPERIDOL LACTATE 5 MG/ML IJ SOLN
INTRAMUSCULAR | Status: AC
  Filled 2022-08-15: qty 1

## 2022-08-15 MED ORDER — HALOPERIDOL LACTATE 5 MG/ML IJ SOLN
2.0000 mg | INTRAMUSCULAR | Status: AC
  Administered 2022-08-15: 2 mg via INTRAVENOUS

## 2022-08-15 NOTE — Progress Notes (Signed)
PROGRESS NOTE    [name]  ZOX:[id number] DOB: Aug 25, 1965 DOA: 08/10/2022 PCP: [name], MD   Brief Narrative:  57 y.o. male with past medical history of alcohol abuse, right rotator cuff tear status post repair in November underwent right reverse shoulder replacement on 08/10/2022 by orthopedics.  TRH was consulted on 08/12/2022 after patient became disoriented, agitated and was found to be hypotensive, tachycardic.  There was a concern about stroke but neurology saw the patient and code stroke was discontinued.  He was treated with IV Ativan and started on CIWA protocol.  Assessment & Plan:   Possible alcohol withdrawal causing acute delirium -Continues to remain in withdrawal.  Patient transferred to progressive unit on 08/14/2022 because of continued withdrawal.  Continue CIWA protocol with Ativan along with multivitamin, thiamine, folic acid.  Continue scheduled Librium for today,  BID.  Continue as needed Zyprexa for severe agitation. -Monitor mental status.  Patient is on multiple antidepressants/antipsychotics and on pain meds as well which is probably contributing to increased sedation.  Hypertensive urgency -Possibly from alcohol withdrawal.  Blood pressure still elevated but improving.  Continue isosorbide mononitrate, losartan and as needed IV antihypertensives.  Increased dose of Coreg to 25 mg twice a day on 08/14/2022.  Right reverse shoulder replacement -Management as per primary orthopedics team  Normocytic anemia -Possibly from chronic illnesses.  Hemoglobin stable  Thrombocytopenia -Resolved  Hyponatremia -Resolved  Anxiety and depression--continue current antidepressant/antipsychotic.  Outpatient follow-up with psychiatry  Diabetes mellitus type 2 with hypoglycemia -Continue CBGs with SSI.  Carb modified diet.  Will hold metformin while inpatient.  Hypothyroidism -Continue levothyroxine  Hyperlipidemia -Continue  ezetimibe    Subjective: Patient seen and examined at bedside.  Awake, still slow to respond but answers some questions appropriately.  Nursing staff reports intermittent confusion yesterday.  No fever, seizures, vomiting reported. Objective: Vitals:   08/15/22 0000 08/15/22 0400 08/15/22 0504 08/15/22 0742  BP: (!) 142/68 (!) 168/83 (!) 156/75 (!) 185/106  Pulse: 76 86 79 88  Resp: Temp:  97.9 F (36.6 C)    TempSrc:  Axillary    SpO2: 92% 93% 95% 93%  Weight:      Height:        Intake/Output Summary (Last 24 hours) at 08/15/2022 0819 Last data filed at 08/15/2022 0300 Gross per 24 hour  Intake 693.75 ml  Output 1400 ml  Net -706.25 ml    Filed Weights   08/10/22 0547  Weight: 104.3 kg    Examination:  General: No acute distress.  On 2 L oxygen by nasal cannula.   ENT/neck: No obvious JVD elevation or palpable neck masses noted respiratory: Decreased breath sounds at bases with scattered crackles  CVS: Rate mostly controlled; S1 and S2 are heard  abdominal: Soft, nontender, distended mildly; no organomegaly, normal bowel sounds heard.   Extremities: No clubbing; mild lower extremity edema present CNS: Alert; slow to respond; answers some questions appropriately.  Poor historian.  No focal neurologic deficit.  Able to move extremities Lymph: No obvious palpable lymphadenopathy noted Skin: No obvious rashes/petechiae psych: Flat affect currently.  Is not agitated currently. Musculoskeletal: Right shoulder dressing present.    Data Reviewed: I have personally reviewed following labs and imaging studies  CBC: Recent Labs  Lab 08/12/22 1643 08/14/22 0352  WBC 6.0 4.1  NEUTROABS  --  2.9  HGB 11.0* 9.4*  HCT 33.8* 28.1*  MCV 95.2 93.4  PLT 135* 155    Basic  Metabolic Panel: Recent Labs  Lab 08/12/22 1643 08/14/22 0352  NA 133* 137  K 4.3 3.7  CL 99 101  CO2 24 28  GLUCOSE 105* 97  BUN 17 15  CREATININE 1.14 0.92  CALCIUM 8.2* 7.9*   MG  --  1.9    GFR: Estimated Creatinine Clearance: 110.6 mL/min (by C-G formula based on SCr of 0.92 mg/dL). Liver Function Tests: Recent Labs  Lab 08/14/22 0352  AST 18  ALT 12  ALKPHOS 68  BILITOT 0.7  PROT 5.3*  ALBUMIN 2.5*    No results for input(s): "LIPASE", "AMYLASE" in the last 168 hours. No results for input(s): "AMMONIA" in the last 168 hours. Coagulation Profile: No results for input(s): "INR", "PROTIME" in the last 168 hours. Cardiac Enzymes: No results for input(s): "CKTOTAL", "CKMB", "CKMBINDEX", "TROPONINI" in the last 168 hours. BNP (last 3 results) No results for input(s): "PROBNP" in the last 8760 hours. HbA1C: No results for input(s): "HGBA1C" in the last 72 hours. CBG: Recent Labs  Lab 08/14/22 1602 08/14/22 1933 08/14/22 2123 08/14/22 2140 08/15/22 0742  GLUCAP 86 92 69* 86 89    Lipid Profile: No results for input(s): "CHOL", "HDL", "LDLCALC", "TRIG", "CHOLHDL", "LDLDIRECT" in the last 72 hours. Thyroid Function Tests: No results for input(s): "TSH", "T4TOTAL", "FREET4", "T3FREE", "THYROIDAB" in the last 72 hours. Anemia Panel: No results for input(s): "VITAMINB12", "FOLATE", "FERRITIN", "TIBC", "IRON", "RETICCTPCT" in the last 72 hours. Sepsis Labs: No results for input(s): "PROCALCITON", "LATICACIDVEN" in the last 168 hours.  No results found for this or any previous visit (from the past 240 hour(s)).       Radiology Studies: No results found.      Scheduled Meds:  ARIPiprazole  5 mg Oral QHS   aspirin EC  81 mg Oral q1800   buPROPion  300 mg Oral Daily   busPIRone  15 mg Oral TID   carvedilol  25 mg Oral BID   celecoxib  100 mg Oral BID   dicyclomine  20 mg Oral Q6H   docusate sodium  100 mg Oral BID   doxycycline  100 mg Oral Q12H   ezetimibe  10 mg Oral Daily   folic acid  1 mg Oral Daily   furosemide  20 mg Oral Once per day on Mon Thu   insulin aspart  0-15 Units Subcutaneous TID WC   insulin aspart  0-5 Units  Subcutaneous QHS   isosorbide mononitrate  30 mg Oral Daily   labetalol  10 mg Intravenous Once   levothyroxine  88 mcg Oral Q0600   losartan  100 mg Oral Daily   metFORMIN  500 mg Oral BID WC   multivitamin with minerals  1 tablet Oral Daily   pantoprazole  80 mg Oral Daily   thiamine  100 mg Oral Daily   Or   thiamine  100 mg Intravenous Daily   Continuous Infusions:  methocarbamol (ROBAXIN) IV            Glade Lloyd, MD [facility] 08/15/2022, 8:19 AM

## 2022-08-15 NOTE — Progress Notes (Signed)
Pt transferred to Progressive care unit 5 [place].

## 2022-08-15 NOTE — Progress Notes (Signed)
  Transition of Care [facility]) Screening Note   Patient Details  Name: [name] Date of Birth: 1966-01-29   Transition of [facility]) CM/SW Contact:    Mearl Latin, LCSW Phone Number: 08/15/2022, 1:25 PM    Transition of Care Department [facility]) has reviewed patient who transferred from 5N. He is an [facility] Bundle patient. We will continue to monitor patient advancement through interdisciplinary progression rounds. If new patient transition needs arise, please place a TOC consult.

## 2022-08-15 NOTE — Progress Notes (Signed)
  Subjective: Patient stable.  Pain controlled.  He looks more alert and oriented and comfortable today than he has in the past several days.  Appreciate medical management of withdrawal.   Objective: Vital signs in last 24 hours: Temp:  [97.9 F (36.6 C)-98.9 F (37.2 C)] 98.4 F (36.9 C) (04/23 1900) Pulse Rate:  [68-99] 83 (04/23 1900) Resp:  [12-19] 14 (04/23 1900) BP: (142-202)/(68-106) 173/88 (04/23 1900) SpO2:  [91 %-96 %] 92 % (04/23 1900)  Intake/Output from previous day: 04/22 0701 - 04/23 0700 In: 693.8 [P.O.:475; I.V.:218.8] Out: 1400 [Urine:1400] Intake/Output this shift: No intake/output data recorded.  Exam:  Patient has good forward flexion and abduction of the right arm.  There is some swelling present.  Dressing is dry.  Labs: Recent Labs    08/14/22 0352  HGB 9.4*   Recent Labs    08/14/22 0352  WBC 4.1  RBC 3.01*  HCT 28.1*  PLT 155   Recent Labs    08/14/22 0352  NA 137  K 3.7  CL 101  CO2 28  BUN 15  CREATININE 0.92  GLUCOSE 97  CALCIUM 7.9*   No results for input(s): "LABPT", "INR" in the last 72 hours.  Assessment/Plan: Plan is patient is ready to go home from orthopedic standpoint.  Anticipate possibly tomorrow if he is medically cleared.  He will follow-up with [place] in about 10 days for clinical recheck.   Marrianne Mood Anamae Rochelle 08/15/2022, 9:01 PM

## 2022-08-15 NOTE — Progress Notes (Signed)
Initial Nutrition Assessment  DOCUMENTATION CODES:   Not applicable  INTERVENTION:   Continue Boost Breeze po TID, each supplement provides 250 kcal and 9 grams of protein Continue liberalized regular diet  Encourage good PO intake Continue Folic Acid, Thiamine, and Multivitamin w/ minerals daily Meal ordering with assist Menu reviewed with pt   NUTRITION DIAGNOSIS:   Increased nutrient needs related to post-op healing as evidenced by estimated needs.  GOAL:   Patient will meet greater than or equal to 90% of their needs  MONITOR:   PO intake, Supplement acceptance, Labs, I & O's  REASON FOR ASSESSMENT:   Consult Assessment of nutrition requirement/status  ASSESSMENT:   57 y.o. male presented for surgery of R shoulder. Had rotator cuff repair done in November. PMH includes HLD, T2DM, GERD, MDD, pancreatitis 2/2 EtOH, and HTN.    4/18 - Op, R reverse shoulder replacement 4/20 - rapid response called 2/2 possible EtOH withdrawal 4/22 - transferred to progressive care  Pt laying in bed, woke to RD voice. Pt pleasantly confused, repeated to RD multiple times that he was confused. He shared he was unsure where he was and thought he had an appointment today.  Pt reports that he had a good appetite at home PTA, just has not been well since surgery. He thinks this is secondary to pain. Denies any nausea or vomiting. RD reviewed menu with pt and discussed having someone come in to take order.  Discussed oral nutrition supplements with pt. Reports that he has tried Ensure in the past and they hurt his stomach, but he was agreeable to try [employer].   Discussed case with RN.  Medications reviewed and include: Bentyl, Colace, Doxycycline, Folic acid, Lasix, NovoLog SSI, Metformin, MVI, Protonix, Thiamine  Labs reviewed: 24 hr CBGs 69-109  Diet Order:   Diet Order             Diet - low sodium heart healthy           Diet - low sodium heart healthy           Diet  regular Room service appropriate? Yes; Fluid consistency: Thin  Diet effective now                  EDUCATION NEEDS:   Not appropriate for education at this time  Skin:  Skin Assessment: Reviewed RN Assessment  Last BM:  4/23 - Type 4, large  Height:  Ht Readings from Last 1 Encounters:  08/10/22 6' (1.829 m)   Weight:  Wt Readings from Last 1 Encounters:  08/10/22 104.3 kg   Ideal Body Weight:  80.9 kg  BMI:  Body mass index is 31.19 kg/m.  Estimated Nutritional Needs:  Kcal:  1610-9604 Protein:  110-130 grams Fluid:  >/= 2 L   Kirby Crigler RD, LDN Clinical Dietitian See [facility] for contact information.

## 2022-08-16 DIAGNOSIS — Z96611 Presence of right artificial shoulder joint: Secondary | ICD-10-CM | POA: Diagnosis not present

## 2022-08-16 LAB — BASIC METABOLIC PANEL
Anion gap: 11 (ref 5–15)
BUN: 14 mg/dL (ref 6–20)
CO2: 27 mmol/L (ref 22–32)
Calcium: 9 mg/dL (ref 8.9–10.3)
Chloride: 99 mmol/L (ref 98–111)
Creatinine, Ser: 0.89 mg/dL (ref 0.61–1.24)
GFR, Estimated: >60 mL/min (ref >60)
Glucose, Bld: 115 mg/dL — ABNORMAL HIGH (ref 70–99)
Potassium: 3.2 mmol/L — ABNORMAL LOW (ref 3.5–5.1)
Sodium: 137 mmol/L (ref 135–145)

## 2022-08-16 LAB — GLUCOSE, CAPILLARY: Glucose-Capillary: 103 mg/dL — ABNORMAL HIGH (ref 70–99)

## 2022-08-16 LAB — MAGNESIUM: Magnesium: 1.9 mg/dL (ref 1.7–2.4)

## 2022-08-16 LAB — BRAIN NATRIURETIC PEPTIDE: B Natriuretic Peptide: 187.7 pg/mL — ABNORMAL HIGH (ref 0.0–100.0)

## 2022-08-16 MED ORDER — FOLIC ACID 1 MG PO TABS: 1.0000 mg | ORAL_TABLET | Freq: Every day | ORAL | 0 refills | Status: DC

## 2022-08-16 MED ORDER — CHLORDIAZEPOXIDE HCL 5 MG PO CAPS
10.0000 mg | ORAL_CAPSULE | Freq: Three times a day (TID) | ORAL | Status: DC
  Administered 2022-08-16: 10 mg via ORAL
  Filled 2022-08-16: qty 2

## 2022-08-16 MED ORDER — EPHEDRINE 5 MG/ML INJ
INTRAVENOUS | Status: AC
  Filled 2022-08-16: qty 5

## 2022-08-16 MED ORDER — LIDOCAINE 2% (20 MG/ML) 5 ML SYRINGE
INTRAMUSCULAR | Status: AC
  Filled 2022-08-16: qty 5

## 2022-08-16 MED ORDER — HYDRALAZINE HCL 20 MG/ML IJ SOLN: 10.0000 mg | Freq: Four times a day (QID) | INTRAMUSCULAR | Status: DC | PRN

## 2022-08-16 MED ORDER — AMLODIPINE BESYLATE 10 MG PO TABS
10.0000 mg | ORAL_TABLET | Freq: Every day | ORAL | Status: DC
  Administered 2022-08-16: 10 mg via ORAL
  Filled 2022-08-16: qty 1

## 2022-08-16 MED ORDER — FENTANYL CITRATE (PF) 250 MCG/5ML IJ SOLN
INTRAMUSCULAR | Status: AC
  Filled 2022-08-16: qty 5

## 2022-08-16 MED ORDER — DEXAMETHASONE SODIUM PHOSPHATE 10 MG/ML IJ SOLN
INTRAMUSCULAR | Status: AC
  Filled 2022-08-16: qty 1

## 2022-08-16 MED ORDER — KETOROLAC TROMETHAMINE 30 MG/ML IJ SOLN
INTRAMUSCULAR | Status: AC
  Filled 2022-08-16: qty 1

## 2022-08-16 MED ORDER — POTASSIUM CHLORIDE CRYS ER 20 MEQ PO TBCR: 40.0000 meq | EXTENDED_RELEASE_TABLET | Freq: Once | ORAL | Status: DC

## 2022-08-16 MED ORDER — MIDAZOLAM HCL 2 MG/2ML IJ SOLN
INTRAMUSCULAR | Status: AC
  Filled 2022-08-16: qty 2

## 2022-08-16 MED ORDER — ONDANSETRON HCL 4 MG/2ML IJ SOLN
INTRAMUSCULAR | Status: AC
  Filled 2022-08-16: qty 2

## 2022-08-16 MED ORDER — ATORVASTATIN CALCIUM 80 MG PO TABS: 80.0000 mg | ORAL_TABLET | Freq: Every day | ORAL | Status: DC

## 2022-08-16 MED ORDER — PROPOFOL 10 MG/ML IV BOLUS
INTRAVENOUS | Status: AC
  Filled 2022-08-16: qty 20

## 2022-08-16 MED ORDER — HYDRALAZINE HCL 50 MG PO TABS
50.0000 mg | ORAL_TABLET | Freq: Three times a day (TID) | ORAL | Status: DC
  Administered 2022-08-16: 50 mg via ORAL
  Filled 2022-08-16: qty 1

## 2022-08-16 NOTE — Progress Notes (Signed)
[name] is alert and oriented x4. He was advised that due to his blood pressure being elevated he should discharge tomorrow. [name] stated he wanted to be released today, although blood pressure was increased. MD [name] and MD [name] spoke with pt about the risks and benefits of staying versus leaving AMA. Son at bedside during this time, AMA form completed. Mende exited with son and belongings.

## 2022-08-16 NOTE — Evaluation (Signed)
Occupational Therapy Evaluation and Discharge Patient Details Name: [name] MRN: [id number] DOB: June 19, 1965 Today's Date: 08/16/2022   History of Present Illness Pt is a 57 y/o male admitted for R reverse total shoulder arthroplasty. Hospital course complicated by acute delirium due and hypertensive urgency due to alcohol withdrawl. PMH: prior rotator cuff tear repair 11/23, sleep apnea, DM, GERD, HTN, HLD, depression.   Clinical Impression   Pt is functioning modified independently. Reinforced shoulder precautions, NWB status, pendulum exercises and AROM R elbow to hand. Pt with significant edema, per MD note, plans to order a sleeve. Pt demonstrated ability to dress himself in preparation for discharge home. Verbalized understanding of how to bathe and perform pericare, with L hand, adhering to shoulder precautions. Recommend further therapy as per orthopedic surgeon.      Recommendations for follow up therapy are one component of a multi-disciplinary discharge planning process, led by the attending physician.  Recommendations may be updated based on patient status, additional functional criteria and insurance authorization.   Assistance Recommended at Discharge PRN  Patient can return home with the following Assistance with cooking/housework;Assist for transportation    Functional Status Assessment  Patient has had a recent decline in their functional status and demonstrates the ability to make significant improvements in function in a reasonable and predictable amount of time.  Equipment Recommendations  None recommended by OT    Recommendations for Other Services       Precautions / Restrictions Precautions Precautions: Shoulder Precaution Booklet Issued: Yes (comment) Required Braces or Orthoses: Sling Restrictions Weight Bearing Restrictions: Yes RUE Weight Bearing: Non weight bearing      Mobility Bed Mobility               General bed mobility comments:  in chair    Transfers Overall transfer level: Independent Equipment used: None                      Balance                                           ADL either performed or assessed with clinical judgement   ADL Overall ADL's : Modified independent                                     Functional mobility during ADLs: Modified independent General ADL Comments: reinforced compensatory strategies for ADLs, sling donning and doffing and wearing schedule     Vision Patient Visual Report: No change from baseline       Perception     Praxis      Pertinent Vitals/Pain Pain Assessment Pain Assessment: Faces Faces Pain Scale: Hurts a little bit Pain Location: R shoulder Pain Descriptors / Indicators: Sore Pain Intervention(s): Monitored during session, Repositioned     Hand Dominance Right   Extremity/Trunk Assessment Upper Extremity Assessment Upper Extremity Assessment: RUE deficits/detail RUE Deficits / Details: completed AROM elbow to hand, pendulum exercises       Cervical / Trunk Assessment Cervical / Trunk Assessment: Normal   Communication     Cognition Arousal/Alertness: Awake/alert Behavior During Therapy: WFL for tasks assessed/performed, Flat affect Overall Cognitive Status: Within Functional Limits for tasks assessed  General Comments       Exercises     Shoulder Instructions      Home Living Family/patient expects to be discharged to:: Private residence Living Arrangements: Alone Available Help at Discharge: Family;Available 24 hours/day Type of Home: Mobile home Home Access: Stairs to enter Entrance Stairs-Number of Steps: 4-5 Entrance Stairs-Rails: Left;Right Home Layout: One level     Bathroom Shower/Tub: Producer, television/film/video: Standard     Home Equipment: Medical laboratory scientific officer - single point   Additional Comments: son planning to stay with  pt for 1 week after surgery      Prior Functioning/Environment Prior Level of Function : Independent/Modified Independent             Mobility Comments: occasional cane use if needed though does not always use          OT Problem List: Pain;Decreased strength      OT Treatment/Interventions:      OT Goals(Current goals can be found in the care plan section) Acute Rehab OT Goals OT Goal Formulation: All assessment and education complete, DC therapy  OT Frequency:      Co-evaluation              AM-PAC OT "6 Clicks" Daily Activity     Outcome Measure Help from another person eating meals?: None Help from another person taking care of personal grooming?: None Help from another person toileting, which includes using toliet, bedpan, or urinal?: None Help from another person bathing (including washing, rinsing, drying)?: None Help from another person to put on and taking off regular upper body clothing?: None Help from another person to put on and taking off regular lower body clothing?: None 6 Click Score: 24   End of Session    Activity Tolerance: Patient tolerated treatment well Patient left: in chair;with call bell/phone within reach;with chair alarm set  OT Visit Diagnosis: Pain                Time: 1610-9604 OT Time Calculation (min): 27 min Charges:  OT General Charges $OT Visit: 1 Visit OT Evaluation $OT Eval Low Complexity: 1 Low OT Treatments $Self Care/Home Management : 8-22 mins  Berna Spare, OTR/L Acute Rehabilitation Services Office: [phone]   Evern Bio 08/16/2022, 10:29 AM

## 2022-08-16 NOTE — Progress Notes (Signed)
  Subjective: Patient stable this morning.  Pain controlled.  Shoulder feeling improved    Objective: Vital signs in last 24 hours: Temp:  [97.6 F (36.4 C)-98.9 F (37.2 C)] 97.6 F (36.4 C) (04/24 0400) Pulse Rate:  [62-99] 65 (04/24 0600) Resp:  [13-21] 16 (04/24 0600) BP: (152-185)/(83-106) 158/90 (04/24 0600) SpO2:  [91 %-97 %] 94 % (04/24 0600)  Intake/Output from previous day: 04/23 0701 - 04/24 0700 In: -  Out: 400 [Urine:400] Intake/Output this shift: No intake/output data recorded.  Exam:  No bilateral calf tenderness.  Ankle dorsiflexion intact bilaterally.  Right arm has mild swelling but the dressing is intact.  He is able to AB duct and forward flex the arm to about 90 degrees.  Labs: Recent Labs    08/14/22 0352  HGB 9.4*   Recent Labs    08/14/22 0352  WBC 4.1  RBC 3.01*  HCT 28.1*  PLT 155   Recent Labs    08/14/22 0352  NA 137  K 3.7  CL 101  CO2 28  BUN 15  CREATININE 0.92  GLUCOSE 97  CALCIUM 7.9*   No results for input(s): "LABPT", "INR" in the last 72 hours.  Assessment/Plan: Impression is patient is doing well following shoulder replacement.  Blood pressure remains slightly elevated but that is being changed with medication.  He stated that he anticipates not drinking when he goes home.  He will also get his CPAP machine fixed.  From an orthopedic standpoint he is okay to discharge home today.  I do want him to be seen by Occupational Therapy to get a sleeve to wear to help decrease the swelling in the right arm.   Marrianne Mood Canden Cieslinski 08/16/2022, 7:39 AM

## 2022-08-16 NOTE — Plan of Care (Signed)
                                                         AMA  Patient at this time expresses desire to leave the Hospital immidiately, patient has been warned that this is not Medically advisable at this time, and can result in Medical complications like Death and Disability, patient understands and accepts the risks involved and assumes full responsibilty of this decision.  Son bedside says that he cannot help with the situation as his father is adamant to go home, requested to follow-up with PCP within a week.  Informed the primary attending Dr. [name] as well.   Susa Raring M.D on 08/16/2022 at 11:14 AM  [facility]  Time < 30 minutes

## 2022-08-16 NOTE — TOC Transition Note (Addendum)
Transition of Care [facility]) - CM/SW Discharge Note   Patient Details  Name: [name] MRN: [id number] Date of Birth: Aug 03, 1965  Transition of Care [facility]) CM/SW Contact:  Gordy Clement, RN Phone Number: 08/16/2022, 9:31 AM   Clinical Narrative:      CM met with Patient to assess for any DC needs and to follow up on recommended DME.  Patient was recommended a BSC but declined. He stated he did not need it.  Ortho will see prior to DC for recommended sleeve   Patient will follow up as OP with Ortho. Son to transport home No TOC needs     Update  11:08 AM It was discussed in Progression that patient will not DC today due to elevated BP  Update 11:14 AM Provider just advised that Son is at bedside and Patient will sign out AMA    Final next level of care: Home/Self Care Barriers to Discharge: No Barriers Identified   Patient Goals and CMS Choice CMS Medicare.gov Compare Post Acute Care list provided to:: Other (Comment Required) (N/A) Choice offered to / list presented to : NA  Discharge Placement                         Discharge Plan and Services Additional resources added to the After Visit Summary for   In-house Referral: NA Discharge Planning Services: NA Post Acute Care Choice: NA          DME Arranged: N/A DME Agency: NA       HH Arranged: NA HH Agency: NA        Social Determinants of Health (SDOH) Interventions SDOH Screenings   Food Insecurity: No Food Insecurity (08/10/2022)  Recent Concern: Food Insecurity - Food Insecurity Present (07/24/2022)  Housing: Low Risk  (08/10/2022)  Transportation Needs: No Transportation Needs (08/10/2022)  Utilities: Not At Risk (08/10/2022)  Alcohol Screen: Low Risk  (07/24/2022)  Depression (PHQ2-9): Medium Risk (07/26/2022)  Financial Resource Strain: Medium Risk (07/24/2022)  Physical Activity: Insufficiently Active (07/24/2022)  Social Connections: Moderately Integrated (07/24/2022)  Stress: Stress Concern Present  (07/24/2022)  Tobacco Use: Medium Risk (08/11/2022)     Readmission Risk Interventions     No data to display

## 2022-08-16 NOTE — Progress Notes (Signed)
Consult progress note.  Orthopedics primary team.                                                                                                                                                                                                            Patient Demographics:    [name], is a 57 y.o. male, DOB - 1966-03-18, ZOX:[id number]  Outpatient Primary MD for the patient is [name], MD    LOS - 5  Admit date - 08/10/2022    No chief complaint on file.      Brief Narrative (HPI from H&P)   57 y.o. male with past medical history of alcohol abuse, right rotator cuff tear status post repair in November underwent right reverse shoulder replacement on 08/10/2022 by orthopedics. TRH was consulted on 08/12/2022 after patient became disoriented, agitated and was found to be hypotensive, tachycardic. There was a concern about stroke but neurology saw the patient and code stroke was discontinued. He was thought to be in DTs and was treated with IV Ativan and started on CIWA protocol.  He also had issues with poorly controlled hypertension along with some hypokalemia.   Subjective:    [name] today has, No headache, No chest pain, No abdominal pain - No Nausea, No new weakness tingling or numbness, no SOB   Assessment  & Plan :    Acute metabolic encephalopathy possible DTs.  Patient not clear about how much alcohol he consumes, was seen by neurology and previous medical hospitalist team CVA was ruled out, he was treated by CIWA protocol, as needed Zyprexa with much improvement, he is currently on Librium which is being tapered off, strictly counseled to abstain from alcohol but history is not clear.  Currently no headache or focal deficits.  Continue to monitor.   Hypertensive urgency.  Blood pressure this morning systolic 205.  Medications further adjusted.  Monitor if stable likely discharge tomorrow.  Elective  right shoulder rotator cuff surgery.  Defer management to primary orthopedics team.  Anxiety and depression.  No acute issues medications continued outpatient follow-up with his psychiatrist.  Hypothyroidism.  On Synthroid.  Dyslipidemia.  On Zetia.  Hypokalemia.  Replaced.      Condition - Fair  Family Communication  : None present See     Disposition Plan  :    Status is: Inpatient  DVT Prophylaxis  :    SCD's Start: 08/10/22 1441 Place TED hose Start: 08/10/22 1441    Lab Results  Component Value Date   PLT 155 08/14/2022    Diet :  Diet Order             Diet - low sodium heart healthy           Diet - low sodium heart healthy           Diet - low sodium heart healthy           Diet regular Room service appropriate? Yes; Fluid consistency: Thin  Diet effective now                    Inpatient Medications  Scheduled Meds:  amLODipine  10 mg Oral Daily   ARIPiprazole  5 mg Oral QHS   aspirin EC  81 mg Oral q1800   atorvastatin  80 mg Oral Daily   buPROPion  300 mg Oral Daily   busPIRone  15 mg Oral TID   carvedilol  25 mg Oral BID   celecoxib  100 mg Oral BID   chlordiazePOXIDE  10 mg Oral TID   dicyclomine  20 mg Oral Q6H   docusate sodium  100 mg Oral BID   doxycycline  100 mg Oral Q12H   ezetimibe  10 mg Oral Daily   feeding supplement  1 Container Oral TID BM   folic acid  1 mg Oral Daily   furosemide  20 mg Oral Once per day on Mon Thu   hydrALAZINE  50 mg Oral Q8H   insulin aspart  0-15 Units Subcutaneous TID WC   insulin aspart  0-5 Units Subcutaneous QHS   isosorbide mononitrate  30 mg Oral Daily   levothyroxine  88 mcg Oral Q0600   losartan  100 mg Oral Daily   metFORMIN  500 mg Oral BID WC   multivitamin with minerals  1 tablet Oral Daily   pantoprazole  80 mg Oral Daily   potassium chloride  40 mEq Oral Once   thiamine  100 mg Oral Daily   Continuous Infusions:  methocarbamol (ROBAXIN) IV     PRN Meds:.acetaminophen,  clonazePAM, hydrALAZINE, labetalol, methocarbamol **OR** methocarbamol (ROBAXIN) IV, OLANZapine, ondansetron **OR** [DISCONTINUED] ondansetron (ZOFRAN) IV, oxyCODONE, traZODone     Objective:   Vitals:   08/16/22 0200 08/16/22 0400 08/16/22 0600 08/16/22 0900  BP: (!) 152/83 (!) 160/85 (!) 158/90 (!) 163/75  Pulse: 72 65 65   Resp: Temp:  97.6 F (36.4 C)    TempSrc:  Oral    SpO2: 91% 92% 94%   Weight:      Height:        Wt Readings from Last 3 Encounters:  08/10/22 104.3 kg  08/02/22 105.1 kg  07/26/22 104.8 kg     Intake/Output Summary (Last 24 hours) at 08/16/2022 1014 Last data filed at 08/15/2022 2000 Gross per 24 hour  Intake --  Output 400 ml  Net -400 ml     Physical Exam  Awake Alert, No new F.N deficits, Normal affect, currently not in DTs [place].AT,PERRAL Supple Neck, No JVD,   Symmetrical Chest wall movement, Good air movement bilaterally, CTAB RRR,No Gallops,Rubs or new Murmurs,  +ve B.Sounds, Abd Soft, No tenderness,   No Cyanosis, R.shoulder postop site stable, arm in sling,       Data Review:    Recent Labs  Lab 08/12/22 1643 08/14/22 0352  WBC 6.0 4.1  HGB 11.0* 9.4*  HCT 33.8* 28.1*  PLT 135* 155  MCV 95.2 93.4  MCH 31.0 31.2  MCHC 32.5 33.5  RDW 13.5 13.5  LYMPHSABS  --  0.5*  MONOABS  --  0.5  EOSABS  --  0.3  BASOSABS  --  0.0    Recent Labs  Lab 08/12/22 1643 08/14/22 0352 08/16/22 0832  NA 133* 137 137  K 4.3 3.7 3.2*  CL 99 101 99  CO2 24 28 27   ANIONGAP 10 8 11   GLUCOSE 105* 97 115*  BUN 17 15 14   CREATININE 1.14 0.92 0.89  AST  --  18  --   ALT  --  12  --   ALKPHOS  --  68  --   BILITOT  --  0.7  --   ALBUMIN  --  2.5*  --   BNP  --   --  187.7*  MG  --  1.9 1.9  CALCIUM 8.2* 7.9* 9.0      Radiology Reports DG Chest 1 View  Result Date: 08/12/2022 CLINICAL DATA:  Altered mental status EXAM: CHEST  1 VIEW COMPARISON:  None Available. FINDINGS: Enlarged cardiopericardial silhouette with  vascular congestion. There is poor inflation. Mild opacity along the minor fissure. No pneumothorax. Overlapping cardiac leads. Right shoulder arthroplasty. The left inferior costophrenic angle is clipped off the edge of the film IMPRESSION: Poor inflation with enlarged cardiopericardial silhouette vascular congestion. Mild opacity along the margin of the minor fissure. Recommend follow-up. If needed a repeat x-ray with improved inflation could be considered as clinically appropriate Electronically Signed   By: Karen Kays M.D.   On: 08/12/2022 19:30      Signature  -   Susa Raring M.D on 08/16/2022 at 10:14 AM   -  To page go to [email or web address]

## 2022-08-16 NOTE — Discharge Instructions (Signed)
In a time of Crisis: [facility]  Mobile Crisis Management provides immediate crisis response, 24/7.  Call [phone]  [facility] for MH/DD/SA [facility] is available 24 hours a day, 7 days a week. Customer Service Specialists will assist you to find a crisis provider that is well-matched with your needs. Your local number is: [phone]  [facility] ([facility]) IOP, individual counseling, medication management [address] [place], [place] [address] [phone] Call for intake hours; Medicaid and Uninsured    Outpatient Substance Use Providers  [facility] ([facility]) Group and individual counseling. [address]  [place], [place] [address] [phone] [place]: [phone]  [place]: [phone] Medicaid and uninsured.   [facility] Offers IOP groups multiple times per week. [address] [place], [place] [address] [phone] Takes Medicaid and other insurances.   [facility]  Chemical Dependency Intensive Outpatient Program (IOP) [address] [place], [place] [address] [phone] Takes Nurse, learning disability and [place].   [facility]  IOP and Partial Hospitalization Program  [address]  [place], [place] [address] [phone] Private Insurance, [place] only for partial hospitalization  [facility] [address], [place], [place] [address] [phone] Monday-Friday. Short and Long term options. [facility] ([facility]) IOP, individual counseling, medication management [address] [place], [place] [address] [phone] Medicaid and [facility]  [facility] [address]  [place], [place] [address] [phone] Private Insurance and Self Pay   [facility] [address]  [place], [place] [address] [phone] Private Insurance, [place], and Self Pay   [facility]  [address] [place], [place] [address] [facility]  [address]  [place], [place] [address] [phone]  [facility]  [address] [place], [place] [address] [phone]      Residential Substance Use Treatment Programs  [facility] [address] [place], [place] [address] [phone] or [phone] Detox and Residential Rehab 14 days (Medicare, Medicaid, private insurance, and self pay). No methadone. Call for pre-screen.   [facility]) [address]  [place], [place] [address] [phone] Detox (self Pay and Medicaid Limited availability) Rehab Only Male (Medicare, Medicaid, and Self Pay)-No methadone.  [facility] [address] [place], [place] [address] [phone] or [phone] Private Insurance only  [facility] [address] [place], [place] [address] Phone:  [phone] Must be detoxed 72 hours prior to admission; 28 day program.  Self-pay.  [facility] [address]  [place], [place] [phone] [employer], Medicare, [place] (not straight [place]). They offer assistance with transportation.   [facility] [address] [place],  [place], [place] [address] [phone] Christian Based Program. Men only. No insurance  [facility] [address] [place], [place] [address] Women's: [phone] Men's: [phone] No Medicaid.   [facility] [place] Locations across the [place] (mainly [place]) willing to help with transportation.  [phone] [employer]. [facility]  [address]  [place], [place] [address] [phone] Treatment Only, must make assessment appointment, and must be sober for  assessment appointment. Self pay, Medicare A and B, [facility], must be [facility] resident. No  methadone.   [facility]  [address] [place], [place] [address] [phone] No pending legal charges, Long-term work program. No methadone. Call for assessment.  [facility]  [address], [place], [place] [address] [phone] or [phone] Commercial Insurance Only  [facility] - [phone] Private Insurance (no [place]). Males/Females, call to make referrals, multiple facilities   [facility] [address],  [place], [place] [address]  [phone] Men Only Upfront Fee   [facility]no methadone [facility] [address] [place], [place] [address] [phone] (f)

## 2022-08-17 ENCOUNTER — Encounter: Payer: Self-pay | Admitting: Internal Medicine

## 2022-08-17 ENCOUNTER — Telehealth: Payer: Self-pay | Admitting: *Deleted

## 2022-08-17 ENCOUNTER — Encounter: Payer: Self-pay | Admitting: Cardiovascular Disease

## 2022-08-17 NOTE — Transitions of Care (Post Inpatient/ED Visit) (Signed)
   08/17/2022  Name: [name] MRN: [id number] DOB: 10/16/1965  Today's TOC FU Call Status: Today's TOC FU Call Status:: Successful TOC FU Call Competed TOC FU Call Complete Date: 08/17/22 Transition Care Management Follow-up Telephone Call Date of Discharge: 08/16/22 (Left AMA) [facility]charge [facility]) Type of Discharge: Inpatient Admission Primary Inpatient Discharge Diagnosis:: Rt shoulder pain How have you been since you were released from the hospital?: Better (Per patient he now has the sling on it feels better) Any questions or concerns?: Yes Patient Questions/Concerns::Patient has question about BNP . Patient has left message with PCP and Cardiologist. RN discussed what the BNP could man and reinforced to him to discuss with DRS.  Patient Questions/Concerns Addressed:  Items Reviewed: Did you receive and understand the discharge instructions provided?: No (patient left AMA and no discharge instructions was given) Medications obtained and verified?: No Medications Not Reviewed Reasons:: Other: (No new meds ordered/ Pt left AMA) Any new allergies since your discharge?: No Dietary orders reviewed?: No Do you have support at home?: Yes People in Home: alone Name of Support/Comfort Primary Source: Cristal Deer son  Home Care and Equipment/Supplies: Were Home Health Services Ordered?: NA Any new equipment or medical supplies ordered?: NA  Functional Questionnaire: Do you need assistance with bathing/showering or dressing?: No Do you need assistance with meal preparation?: Yes Do you need assistance with eating?: No Do you have difficulty maintaining continence: No Do you need assistance with getting out of bed/getting out of a chair/moving?: No Do you have difficulty managing or taking your medications?: No  Follow up appointments reviewed: PCP Follow-up appointment confirmed?: NA (Patient left AMA. Refused RN to make a follow up with PCP. Patient stated he has  left a message for cardiolofist regarding the elevated BNP and would wait and see what he says.) [facility] Follow-up appointment confirmed?: NA Do you need transportation to your follow-up appointment?: No Do you understand care options if your condition(s) worsen?: Yes-patient verbalized understanding   Interventions Today    Flowsheet Row Most Recent Value  General Interventions   General Interventions Discussed/Reviewed General Interventions Discussed, General Interventions Reviewed, Doctor Visits  [Pt left AMA. Patient decline f/u visit with PCP.]  Doctor Visits Discussed/Reviewed Doctor Visits Discussed, Doctor Visits Reviewed         Gean Maidens BSN RN [facility] [phone]

## 2022-08-20 DIAGNOSIS — M19011 Primary osteoarthritis, right shoulder: Secondary | ICD-10-CM

## 2022-08-20 DIAGNOSIS — Z969 Presence of functional implant, unspecified: Secondary | ICD-10-CM

## 2022-08-23 ENCOUNTER — Other Ambulatory Visit (HOSPITAL_COMMUNITY): Payer: Self-pay | Admitting: Psychiatry

## 2022-08-23 DIAGNOSIS — F419 Anxiety disorder, unspecified: Secondary | ICD-10-CM

## 2022-08-23 DIAGNOSIS — F331 Major depressive disorder, recurrent, moderate: Secondary | ICD-10-CM

## 2022-08-25 ENCOUNTER — Other Ambulatory Visit (INDEPENDENT_AMBULATORY_CARE_PROVIDER_SITE_OTHER): Payer: Medicare HMO

## 2022-08-25 ENCOUNTER — Ambulatory Visit (INDEPENDENT_AMBULATORY_CARE_PROVIDER_SITE_OTHER): Payer: Medicare HMO | Admitting: Surgical

## 2022-08-25 ENCOUNTER — Telehealth: Payer: Self-pay | Admitting: *Deleted

## 2022-08-25 ENCOUNTER — Encounter: Payer: Self-pay | Admitting: Surgical

## 2022-08-25 ENCOUNTER — Other Ambulatory Visit: Payer: Self-pay | Admitting: *Deleted

## 2022-08-25 DIAGNOSIS — M75121 Complete rotator cuff tear or rupture of right shoulder, not specified as traumatic: Secondary | ICD-10-CM | POA: Diagnosis not present

## 2022-08-25 DIAGNOSIS — Z96611 Presence of right artificial shoulder joint: Secondary | ICD-10-CM

## 2022-08-25 DIAGNOSIS — M19011 Primary osteoarthritis, right shoulder: Secondary | ICD-10-CM

## 2022-08-25 MED ORDER — OXYCODONE HCL 5 MG PO TABS: 5.0000 mg | ORAL_TABLET | Freq: Four times a day (QID) | ORAL | 0 refills | Status: DC | PRN

## 2022-08-25 NOTE — Telephone Encounter (Signed)
Met with patient in office today during his post op appointment; PA feels he is doing well. Will start OPPT- patient preferred to go to [facility] in [place], but after referral made, Roseanne Reno contacted CM back and informed patient had too large of an outstanding balance for them to be able to see him. I explained this to patient and referral made with [facility] OPPT in [place]. Awaiting scheduling. He states he thinks they were sending bills to wrong address and will contact Roseanne Reno to correct this.

## 2022-08-25 NOTE — Progress Notes (Signed)
Post-Op Visit Note   Patient: [name]           Date of Birth: 05-12-1965           MRN: [id number] Visit Date: 08/25/2022 PCP: [name], MD   Assessment & Plan:  Chief Complaint:  Chief Complaint  Patient presents with   Right Shoulder - Routine Post Op   Visit Diagnoses:  1. Complete tear of right rotator cuff, unspecified whether traumatic     Plan: [name] is a 57 y.o. male who presents s/p right reverse shoulder arthroplasty on 08/10/2022.  Patient is doing well and pain is overall controlled.  Using CPM machine as instructed.  Denies any chest pain, SOB, fevers, chills.  No complaint of any instability symptoms.  Taking pain medication every 6-8 hours.  He denies any tremors.  Feels he is progressively improving from his hospitalization.  Not taking any of the pain medicine with alcohol.  On exam, patient has range of motion 40 degrees X rotation, 80 degrees abduction, 100 degrees forward elevation passively and actively..  Intact EPL, FPL, finger abduction, finger adduction, pronation/supination, bicep, tricep, deltoid of operative extremity.  Axillary nerve intact with deltoid firing.  Incision is healing well without evidence of infection or dehiscence.  Incision was made sure to be covered with Steri-Strips from the proximal to distal aspect of the length of the incision.  2+ radial pulse of the operative extremity  Plan is discontinue sling.  Okay to very lightly lift with the operative extremity but no lifting anything heavier than a coffee cup or cell phone.  Start physical therapy to focus on passive range of motion and active range of motion with deltoid isometrics.  Do not want to externally rotate past 30 degrees to protect subscapularis repair.  Follow-up in 4 weeks for clinical recheck.   Follow-Up Instructions: No follow-ups on file.   Orders:  Orders Placed This Encounter  Procedures   XR Shoulder Right   No orders of the defined types  were placed in this encounter.   Imaging: No results found.  PMFS History: Patient Active Problem List   Diagnosis Date Noted   Arthritis of right shoulder region 08/20/2022   Retained orthopedic hardware 08/20/2022   Alcohol withdrawal delirium (HCC) 08/13/2022   S/P reverse total shoulder arthroplasty, right 08/10/2022   Pernicious anemia 10/12/2021   Chronic pain associated with significant psychosocial dysfunction 03/05/2021   History of deep venous thrombosis (DVT) of distal vein of left lower extremity 03/05/2021   Arthritis of left knee    S/P TKR (total knee replacement), left 02/10/2021   Grief counseling 11/14/2020   Abnormal electrocardiogram (ECG) (EKG) 09/02/2020   Preoperative evaluation to rule out surgical contraindication 09/02/2020   MDD (major depressive disorder), recurrent episode, moderate (HCC) 05/29/2020   Colon cancer screening 02/02/2020   Eczema of both external ears 12/08/2019   1+ pitting edema 12/08/2019   Polyarthritis of multiple sites 10/27/2019   CAD (coronary artery disease) 10/27/2019   Radiculopathy, cervical region 06/10/2019   Carpal tunnel syndrome, left upper limb 06/10/2019   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   Spinal stenosis of cervical region 12/31/2018   Loose body of left knee    Plantar fasciitis of left foot 08/24/2017   Chronic pain of both hips 06/20/2017   Nontraumatic complete tear of right rotator cuff 09/14/2015   Hypothyroidism due to acquired atrophy of thyroid 09/14/2015   Arthritis of knee 06/23/2014   Encounter for  preventive health examination 06/17/2014   Enlarged RV (right ventricle) 06/17/2014   GERD (gastroesophageal reflux disease) 04/20/2014   Cervical spine degeneration 02/17/2014   Overweight 10/29/2013   Chronic knee pain 10/29/2013   Type 2 diabetes mellitus with diabetic neuropathy, unspecified (HCC) 10/23/2012   Seborrheic dermatitis, unspecified 05/17/2012   Fatty liver 04/22/2012   Hypogonadism  male 03/12/2012   Chronic pain of multiple joints 12/24/2010   Sciatica of left side associated with disorder of lumbar spine 12/24/2010   Hyperlipidemia associated with type 2 diabetes mellitus (HCC) 01/13/2009   Anxiety state 01/13/2009   Essential hypertension 01/13/2009   OSA (obstructive sleep apnea) 01/13/2009   Past Medical History:  Diagnosis Date   Acute medial meniscus tear of left knee    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   Anxiety    Anxiety and depression    Arthritis    knees and shoulders   Benzodiazepine withdrawal with complication (HCC) 01/09/2019   Bruit    L   Chest pain    hx   Cognitive complaints with normal neuropsychological exam 10/23/2016   Diabetes mellitus without complication (HCC)    "borderline", diet controlled, no meds, patient has lost 30 lbs   Edema    Fatty liver    GERD (gastroesophageal reflux disease)    uses Omeprazole   Goiter    HLD (hyperlipidemia)    Hypertension    essential, benign   Hypothyroidism    Impotence of organic origin    Murmur    never has caused any problems   Neuromuscular disorder (HCC)    neuropathy bilateral feet   Other chest pain    tightness, pressure   Palpitation    hx   Precordial pain    Sleep apnea    uses cpap    Family History  Problem Relation Age of Onset   Heart disease Father    Colon cancer Neg Hx    Colon polyps Neg Hx    Esophageal cancer Neg Hx    Rectal cancer Neg Hx    Stomach cancer Neg Hx     Past Surgical History:  Procedure Laterality Date   APPENDECTOMY  2002   [facility] at [facility]   CARDIAC CATHETERIZATION     CARDIAC CATHETERIZATION N/A 08/27/2015   Procedure: Left Heart Cath and Coronary Angiography;  Surgeon: Dolores Patty, MD;  [facility] INVASIVE CV LAB;  Service: Cardiovascular;  Laterality: N/A;   COLONOSCOPY  in his 20's   COLONOSCOPY  2019   EAR CYST EXCISION Left 07/08/2019   Procedure: open excision baker's cyst left knee;  Surgeon:  Cammy Copa, MD;  Location[facility] OR;  Service: Orthopedics;  Laterality: Left;   HERNIA REPAIR  05/15/2013   umbilical   JOINT REPLACEMENT     right knee   KNEE ARTHROSCOPY Left 12/17/2018   Procedure: left knee arthroscopy, meniscal debridement, loose body removal;  Surgeon: Cammy Copa, MD;  [facility];  Service: Orthopedics;  Laterality: Left;   REVERSE SHOULDER ARTHROPLASTY Right 08/10/2022   Procedure: RIGHT REVERSE SHOULDER ARTHROPLASTY, BICEPS TENODESIS;  Surgeon: Cammy Copa, MD;  Location: [facility] OR;  Service: Orthopedics;  Laterality: Right;   REVISION TOTAL KNEE ARTHROPLASTY Right 06/23/2014   DR [name]   SHOULDER ARTHROSCOPY WITH ROTATOR CUFF REPAIR AND SUBACROMIAL DECOMPRESSION Right 03/07/2022   Procedure: RIGHT SHOULDER ARTHROSCOPY, DEBRIDEMENT, MINI OPEN ROTATOR CUFF TEAR REPAIR;  Surgeon: Cammy Copa, MD;  Location: [facility] OR;  Service: Orthopedics;  Laterality: Right;   TONSILLECTOMY     TOTAL KNEE ARTHROPLASTY Right 2011   right   TOTAL KNEE ARTHROPLASTY Left 02/10/2021   Procedure: LEFT TOTAL KNEE ARTHROPLASTY;  Surgeon: Cammy Copa, MD;  Location[facility] OR;  Service: Orthopedics;  Laterality: Left;   TOTAL KNEE REVISION Right 06/23/2014   Procedure: TOTAL KNEE REVISION;  Surgeon: Cammy Copa, MD;  Location: [facility] OR;  Service: Orthopedics;  Laterality: Right;   UPPER GASTROINTESTINAL ENDOSCOPY  04/30/2013   Demetrius Charity TOOTH EXTRACTION     Social History   Occupational History   Occupation: Acupuncturist: Advertising copywriter   Occupation: SERVICE DIRECTOR    Employer: [employer]  Tobacco Use   Smoking status: Former    Packs/day: 2.00    Years: 18.00    Additional pack years: 0.00    Total pack years: 36.00    Types: Cigarettes    Quit date: 04/24/2005    Years since quitting: 17.3   Smokeless tobacco: Never   Tobacco comments:    quit 20 years ago   Vaping Use   Vaping Use:  Never used  Substance and Sexual Activity   Alcohol use: Yes    Alcohol/week: 4.0 standard drinks of alcohol    Types: 4 Cans of beer per week    Comment: Occasionally   Drug use: No   Sexual activity: Yes

## 2022-08-25 NOTE — Telephone Encounter (Signed)
Late entry for 08/17/22 D/C call and 7 day call combined due to prolonged hospitalization after Right Reverse Shoulder Arthroplasty on 08/10/22.

## 2022-08-30 ENCOUNTER — Ambulatory Visit: Payer: Medicare HMO | Attending: Internal Medicine

## 2022-08-30 DIAGNOSIS — M25511 Pain in right shoulder: Secondary | ICD-10-CM | POA: Insufficient documentation

## 2022-08-30 DIAGNOSIS — M25611 Stiffness of right shoulder, not elsewhere classified: Secondary | ICD-10-CM | POA: Insufficient documentation

## 2022-08-30 DIAGNOSIS — Z96611 Presence of right artificial shoulder joint: Secondary | ICD-10-CM | POA: Diagnosis not present

## 2022-08-30 DIAGNOSIS — M19011 Primary osteoarthritis, right shoulder: Secondary | ICD-10-CM | POA: Insufficient documentation

## 2022-08-30 NOTE — Therapy (Signed)
[facility] [address] [place], [place], [address] Phone: [phone]   Fax:  [phone]  Physical Therapy Evaluation  Patient Details  Name: [name] MRN: [id number] Date of Birth: Aug 29, 1965 Referring Provider (PT): [name] Corrie Mckusick, MD   Encounter Date: 08/30/2022   PT End of Session - 08/30/22 1353     Visit Number 1    Number of Visits 25    Date for PT Re-Evaluation 11/24/22    PT Start Time 1353    PT Stop Time 1428    PT Time Calculation (min) 35 min    Activity Tolerance Patient tolerated treatment well    Behavior During Therapy [facility] for tasks assessed/performed             Past Medical History:  Diagnosis Date   Acute medial meniscus tear of left knee    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   Anxiety    Anxiety and depression    Arthritis    knees and shoulders   Benzodiazepine withdrawal with complication (HCC) 01/09/2019   Bruit    L   Chest pain    hx   Cognitive complaints with normal neuropsychological exam 10/23/2016   Diabetes mellitus without complication (HCC)    "borderline", diet controlled, no meds, patient has lost 30 lbs   Edema    Fatty liver    GERD (gastroesophageal reflux disease)    uses Omeprazole   Goiter    HLD (hyperlipidemia)    Hypertension    essential, benign   Hypothyroidism    Impotence of organic origin    Murmur    never has caused any problems   Neuromuscular disorder (HCC)    neuropathy bilateral feet   Other chest pain    tightness, pressure   Palpitation    hx   Precordial pain    Sleep apnea    uses cpap    Past Surgical History:  Procedure Laterality Date   APPENDECTOMY  2002   done at [facility]   CARDIAC CATHETERIZATION     CARDIAC CATHETERIZATION N/A 08/27/2015   Procedure: Left Heart Cath and Coronary Angiography;  Surgeon: Dolores Patty, MD;  Location: [facility] INVASIVE CV LAB;  Service: Cardiovascular;  Laterality:  N/A;   COLONOSCOPY  in his 20's   COLONOSCOPY  2019   EAR CYST EXCISION Left 07/08/2019   Procedure: open excision baker's cyst left knee;  Surgeon: Cammy Copa, MD;  Location: [facility] OR;  Service: Orthopedics;  Laterality: Left;   HERNIA REPAIR  05/15/2013   umbilical   JOINT REPLACEMENT     right knee   KNEE ARTHROSCOPY Left 12/17/2018   Procedure: left knee arthroscopy, meniscal debridement, loose body removal;  Surgeon: Cammy Copa, MD;  Location: [facility] OR;  Service: Orthopedics;  Laterality: Left;   REVERSE SHOULDER ARTHROPLASTY Right 08/10/2022   Procedure: RIGHT REVERSE SHOULDER ARTHROPLASTY, BICEPS TENODESIS;  Surgeon: Cammy Copa, MD;  Location: [facility] OR;  Service: Orthopedics;  Laterality: Right;   REVISION TOTAL KNEE ARTHROPLASTY Right 06/23/2014   DR [name]   SHOULDER ARTHROSCOPY WITH ROTATOR CUFF REPAIR AND SUBACROMIAL DECOMPRESSION Right 03/07/2022   Procedure: RIGHT SHOULDER ARTHROSCOPY, DEBRIDEMENT, MINI OPEN ROTATOR CUFF TEAR REPAIR;  Surgeon: Cammy Copa, MD;  Location: [facility] OR;  Service: Orthopedics;  Laterality: Right;   TONSILLECTOMY     TOTAL KNEE ARTHROPLASTY Right 2011   right   TOTAL KNEE ARTHROPLASTY Left 02/10/2021   Procedure:  LEFT TOTAL KNEE ARTHROPLASTY;  Surgeon: Cammy Copa, MD;  Location: [facility] OR;  Service: Orthopedics;  Laterality: Left;   TOTAL KNEE REVISION Right 06/23/2014   Procedure: TOTAL KNEE REVISION;  Surgeon: Cammy Copa, MD;  Location: [facility] OR;  Service: Orthopedics;  Laterality: Right;   UPPER GASTROINTESTINAL ENDOSCOPY  04/30/2013   Demetrius Charity TOOTH EXTRACTION      There were no vitals filed for this visit.    Subjective Assessment - 08/30/22 1357     Subjective R shoulder: 6/10 currently. 9/10 at worst for the past 2 weeks.    Pertinent History S/P R reverse TSA on 08/10/2022.  Has not yet had PT since then. Pt is R hand dominant. Was given pedulums to do at home following his procedure. Pt fell off  a ladder 3 years ago. Underwent a RTC repair which did not hold, resulting in his current shoulder replacement surgery.    Currently in Pain? Yes    Pain Score 6     Pain Location Shoulder    Pain Orientation Right    Pain Type Surgical pain;Chronic pain    Pain Onset More than a month ago    Pain Frequency Constant                [facility] PT Assessment - 08/30/22 1402       Assessment   Medical Diagnosis M19.011 (ICD-10-CM) - Arthritis of right shoulder region  Z96.611 (ICD-10-CM) - S/P reverse total shoulder arthroplasty, right    Referring Provider (PT) [name] Corrie Mckusick, MD    Onset Date/Surgical Date 08/10/22    Prior Therapy None for current surgery.      Precautions   Precaution Comments Reverse TSA precautions      Restrictions   Other Position/Activity Restrictions Reverse TSA restrictions      Observation/Other Assessments   Observations Surgical incision anterior shoulder/deltoid area, with steri strips, healing satisfactorily.      PROM   Right Shoulder Flexion 120 Degrees    Right Shoulder ABduction 90 Degrees    Right Shoulder Internal Rotation --   Pt able to rest his hand on abdomen   Right Shoulder External Rotation 30 Degrees   with arm at side, and propped with pillows to neutral     Strength   Overall Strength Comments Strength not assessed at this time to promote more healing.                        Objective measurements completed on examination: See above findings.   R reverse TSA on 08/10/2022   Per surgeon note on 08/25/2022 "Plan is discontinue sling. Okay to very lightly lift with the operative extremity but no lifting anything heavier than a coffee cup or cell phone. Start physical therapy to focus on passive range of motion and active range of motion with deltoid isometrics. Do not want to externally rotate past 30 degrees to protect subscapularis repair. Follow-up in 4 weeks for clinical recheck. "   No latex  allergies Blood pressure overall controlled by medication.   Blood pressure L arm sitting, mechanically taken, normal cuff: 157/78, HR 75  Surgical incision anterior shoulder/deltoid area, with steri strips, healing satisfactorily.    Therapeutic exercise  Supine R shoulder PROM  With L UE performing movement    Flexion 10x3   With PT    Abduction 10x3   Improved exercise technique, movement at target joints, use of  target muscles after mod verbal, visual, tactile cues.    Response to treatment PROM feels good per pt.     Clinical impression Pt is a 57 year old male who came to physical therapy S/P R reverse TSA on 08/10/2022. He also presents with limited R shoulder ROM, strength, and pain. Pt will benefit from skilled physical therapy services to address the aforementioned deficits as well as improving function and ability to raise his arm with less difficulty and more comfortably.            Access Code: [email or web address] URL: [email or web address] Date: 08/30/2022 Prepared by: Loralyn Freshwater  Exercises - Supine Shoulder Flexion PROM  - 3 x daily - 7 x weekly - 3 sets - 10 reps                PT Education - 08/30/22 1453     Education Details ther-ex, HEP, POC    Person(s) Educated Patient    Methods Explanation;Demonstration;Tactile cues;Verbal cues;Handout    Comprehension Verbalized understanding;Returned demonstration              PT Short Term Goals - 08/30/22 1441       PT SHORT TERM GOAL #1   Title Pt will be independent with his initial HEP to improve R shoulder ROM, strength and function.    Baseline Pt has started his initial HEP (08/30/2022)    Time 3    Period Weeks    Status New    Target Date 09/22/22               PT Long Term Goals - 08/30/22 1446       PT LONG TERM GOAL #1   Title Pt will imrove R shoulder flexion and abduction AROM to 140 degrees or more to promote ability to reach with less difficulty.     Baseline R shoulder PROM 120 degrees flexion, 90 degrees abduction (08/30/2022)    Time 12    Period Weeks    Status New    Target Date 11/24/22      PT LONG TERM GOAL #2   Title Pt will improve R shoulder ER AROM to at least 70 degrees to promote ability to reach behind his head.    Baseline R shoulder PROM ER with arm at side 30 degrees (08/30/2022)    Time 12    Period Weeks    Status New    Target Date 11/24/22      PT LONG TERM GOAL #3   Title Pt will have at least 4/5 R shoulder flexion, abduction, ER and IR strength to promote ability to use his R UE for functional tasks.    Baseline R shoulder strength not yet tested to allow for more healing. (08/30/2022)    Time 12    Period Weeks    Status New    Target Date 11/24/22      PT LONG TERM GOAL #4   Title Pt will improve his R shoulder FOTO score by at least 10 points as a demonstration of improved function.    Baseline R shoulder FOTO 39 (08/30/2022)    Time 12    Period Weeks    Status New    Target Date 11/24/22                    Plan - 08/30/22 1454     Clinical Impression Statement Pt is a 57 year old male who came to physical therapy  S/P R reverse TSA on 08/10/2022. He also presents with limited R shoulder ROM, strength, and pain. Pt will benefit from skilled physical therapy services to address the aforementioned deficits as well as improving function and ability to raise his arm with less difficulty and more comfortably.    Personal Factors and Comorbidities Comorbidity 3+;Past/Current Experience;Time since onset of injury/illness/exacerbation;Fitness    Comorbidities Anxiety, depression, arthritis, DM, HTN, sleep apnea    Examination-Activity Limitations Bathing;Dressing;Bed Mobility;Hygiene/Grooming;Lift;Caring for Others;Carry;Reach Overhead;Toileting    Stability/Clinical Decision Making Stable/Uncomplicated    Clinical Decision Making Low    Rehab Potential Fair    PT Frequency 2x / week    PT  Duration 12 weeks    PT Treatment/Interventions Therapeutic exercise;Therapeutic activities;Neuromuscular re-education;Patient/family education;Manual techniques;Electrical Stimulation;Passive range of motion    PT Next Visit Plan PROM, A/AROM, manual techniques, modalities PRN    PT Home Exercise Plan Medbridge Access Code: [email or web address]    Consulted and Agree with Plan of Care Patient             Patient will benefit from skilled therapeutic intervention in order to improve the following deficits and impairments:  Pain, Postural dysfunction, Improper body mechanics, Impaired UE functional use, Decreased strength, Decreased range of motion  Visit Diagnosis: Right shoulder pain, unspecified chronicity - Plan: PT plan of care cert/re-cert  Stiffness of right shoulder, not elsewhere classified - Plan: PT plan of care cert/re-cert     Problem List Patient Active Problem List   Diagnosis Date Noted   Arthritis of right shoulder region 08/20/2022   Retained orthopedic hardware 08/20/2022   Alcohol withdrawal delirium (HCC) 08/13/2022   S/P reverse total shoulder arthroplasty, right 08/10/2022   Pernicious anemia 10/12/2021   Chronic pain associated with significant psychosocial dysfunction 03/05/2021   History of deep venous thrombosis (DVT) of distal vein of left lower extremity 03/05/2021   Arthritis of left knee    S/P TKR (total knee replacement), left 02/10/2021   Grief counseling 11/14/2020   Abnormal electrocardiogram (ECG) (EKG) 09/02/2020   Preoperative evaluation to rule out surgical contraindication 09/02/2020   MDD (major depressive disorder), recurrent episode, moderate (HCC) 05/29/2020   Colon cancer screening 02/02/2020   Eczema of both external ears 12/08/2019   1+ pitting edema 12/08/2019   Polyarthritis of multiple sites 10/27/2019   CAD (coronary artery disease) 10/27/2019   Radiculopathy, cervical region 06/10/2019   Carpal tunnel syndrome, left upper limb  06/10/2019   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   Spinal stenosis of cervical region 12/31/2018   Loose body of left knee    Plantar fasciitis of left foot 08/24/2017   Chronic pain of both hips 06/20/2017   Nontraumatic complete tear of right rotator cuff 09/14/2015   Hypothyroidism due to acquired atrophy of thyroid 09/14/2015   Arthritis of knee 06/23/2014   Encounter for preventive health examination 06/17/2014   Enlarged RV (right ventricle) 06/17/2014   GERD (gastroesophageal reflux disease) 04/20/2014   Cervical spine degeneration 02/17/2014   Overweight 10/29/2013   Chronic knee pain 10/29/2013   Type 2 diabetes mellitus with diabetic neuropathy, unspecified (HCC) 10/23/2012   Seborrheic dermatitis, unspecified 05/17/2012   Fatty liver 04/22/2012   Hypogonadism male 03/12/2012   Chronic pain of multiple joints 12/24/2010   Sciatica of left side associated with disorder of lumbar spine 12/24/2010   Hyperlipidemia associated with type 2 diabetes mellitus (HCC) 01/13/2009   Anxiety state 01/13/2009   Essential hypertension 01/13/2009   OSA (obstructive sleep apnea) 01/13/2009   Loralyn Freshwater  PT, DPT  08/30/2022, 3:09 PM  [facility] [address] [place], [address] Phone: [phone]   Fax:  [phone]  Name: [name] MRN: [id number] Date of Birth: 14-Dec-1965

## 2022-09-04 ENCOUNTER — Ambulatory Visit: Payer: Medicare HMO

## 2022-09-04 DIAGNOSIS — M25611 Stiffness of right shoulder, not elsewhere classified: Secondary | ICD-10-CM

## 2022-09-04 DIAGNOSIS — Z96611 Presence of right artificial shoulder joint: Secondary | ICD-10-CM | POA: Diagnosis not present

## 2022-09-04 DIAGNOSIS — M25511 Pain in right shoulder: Secondary | ICD-10-CM

## 2022-09-04 DIAGNOSIS — M19011 Primary osteoarthritis, right shoulder: Secondary | ICD-10-CM | POA: Diagnosis not present

## 2022-09-04 NOTE — Therapy (Signed)
OUTPATIENT PHYSICAL THERAPY TREATMENT NOTE   Patient Name: [name] MRN: [id number] DOB:Mar 25, 1966, 57 y.o., male Today's Date: 09/04/2022  PCP: [name], MD  REFERRING PROVIDER: Cammy Copa, MD   END OF SESSION:  PT End of Session - 09/04/22 1417     Visit Number 2    Number of Visits 25    Date for PT Re-Evaluation 11/24/22    PT Start Time 1417    PT Stop Time 1458    PT Time Calculation (min) 41 min    Activity Tolerance Patient tolerated treatment well    Behavior During Therapy [facility] for tasks assessed/performed             Past Medical History:  Diagnosis Date   Acute medial meniscus tear of left knee    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   Anxiety    Anxiety and depression    Arthritis    knees and shoulders   Benzodiazepine withdrawal with complication (HCC) 01/09/2019   Bruit    L   Chest pain    hx   Cognitive complaints with normal neuropsychological exam 10/23/2016   Diabetes mellitus without complication (HCC)    "borderline", diet controlled, no meds, patient has lost 30 lbs   Edema    Fatty liver    GERD (gastroesophageal reflux disease)    uses Omeprazole   Goiter    HLD (hyperlipidemia)    Hypertension    essential, benign   Hypothyroidism    Impotence of organic origin    Murmur    never has caused any problems   Neuromuscular disorder (HCC)    neuropathy bilateral feet   Other chest pain    tightness, pressure   Palpitation    hx   Precordial pain    Sleep apnea    uses cpap   Past Surgical History:  Procedure Laterality Date   APPENDECTOMY  2002   done at [facility]   CARDIAC CATHETERIZATION     CARDIAC CATHETERIZATION N/A 08/27/2015   Procedure: Left Heart Cath and Coronary Angiography;  Surgeon: Dolores Patty, MD;  Location: [facility] INVASIVE CV LAB;  Service: Cardiovascular;  Laterality: N/A;   COLONOSCOPY  in his 20's   COLONOSCOPY  2019   EAR CYST EXCISION Left 07/08/2019    Procedure: open excision baker's cyst left knee;  Surgeon: Cammy Copa, MD;  Location: [facility] OR;  Service: Orthopedics;  Laterality: Left;   HERNIA REPAIR  05/15/2013   umbilical   JOINT REPLACEMENT     right knee   KNEE ARTHROSCOPY Left 12/17/2018   Procedure: left knee arthroscopy, meniscal debridement, loose body removal;  Surgeon: Cammy Copa, MD;  Location: [facility] OR;  Service: Orthopedics;  Laterality: Left;   REVERSE SHOULDER ARTHROPLASTY Right 08/10/2022   Procedure: RIGHT REVERSE SHOULDER ARTHROPLASTY, BICEPS TENODESIS;  Surgeon: Cammy Copa, MD;  Location: [facility] OR;  Service: Orthopedics;  Laterality: Right;   REVISION TOTAL KNEE ARTHROPLASTY Right 06/23/2014   DR [name]   SHOULDER ARTHROSCOPY WITH ROTATOR CUFF REPAIR AND SUBACROMIAL DECOMPRESSION Right 03/07/2022   Procedure: RIGHT SHOULDER ARTHROSCOPY, DEBRIDEMENT, MINI OPEN ROTATOR CUFF TEAR REPAIR;  Surgeon: Cammy Copa, MD;  Location: [facility] OR;  Service: Orthopedics;  Laterality: Right;   TONSILLECTOMY     TOTAL KNEE ARTHROPLASTY Right 2011   right   TOTAL KNEE ARTHROPLASTY Left 02/10/2021   Procedure: LEFT TOTAL KNEE ARTHROPLASTY;  Surgeon: Cammy Copa, MD;  Location: [facility] OR;  Service:  Orthopedics;  Laterality: Left;   TOTAL KNEE REVISION Right 06/23/2014   Procedure: TOTAL KNEE REVISION;  Surgeon: Cammy Copa, MD;  Location: [facility] OR;  Service: Orthopedics;  Laterality: Right;   UPPER GASTROINTESTINAL ENDOSCOPY  04/30/2013   Demetrius Charity TOOTH EXTRACTION     Patient Active Problem List   Diagnosis Date Noted   Arthritis of right shoulder region 08/20/2022   Retained orthopedic hardware 08/20/2022   Alcohol withdrawal delirium (HCC) 08/13/2022   S/P reverse total shoulder arthroplasty, right 08/10/2022   Pernicious anemia 10/12/2021   Chronic pain associated with significant psychosocial dysfunction 03/05/2021   History of deep venous thrombosis (DVT) of distal vein of left lower  extremity 03/05/2021   Arthritis of left knee    S/P TKR (total knee replacement), left 02/10/2021   Grief counseling 11/14/2020   Abnormal electrocardiogram (ECG) (EKG) 09/02/2020   Preoperative evaluation to rule out surgical contraindication 09/02/2020   MDD (major depressive disorder), recurrent episode, moderate (HCC) 05/29/2020   Colon cancer screening 02/02/2020   Eczema of both external ears 12/08/2019   1+ pitting edema 12/08/2019   Polyarthritis of multiple sites 10/27/2019   CAD (coronary artery disease) 10/27/2019   Radiculopathy, cervical region 06/10/2019   Carpal tunnel syndrome, left upper limb 06/10/2019   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   Spinal stenosis of cervical region 12/31/2018   Loose body of left knee    Plantar fasciitis of left foot 08/24/2017   Chronic pain of both hips 06/20/2017   Nontraumatic complete tear of right rotator cuff 09/14/2015   Hypothyroidism due to acquired atrophy of thyroid 09/14/2015   Arthritis of knee 06/23/2014   Encounter for preventive health examination 06/17/2014   Enlarged RV (right ventricle) 06/17/2014   GERD (gastroesophageal reflux disease) 04/20/2014   Cervical spine degeneration 02/17/2014   Overweight 10/29/2013   Chronic knee pain 10/29/2013   Type 2 diabetes mellitus with diabetic neuropathy, unspecified (HCC) 10/23/2012   Seborrheic dermatitis, unspecified 05/17/2012   Fatty liver 04/22/2012   Hypogonadism male 03/12/2012   Chronic pain of multiple joints 12/24/2010   Sciatica of left side associated with disorder of lumbar spine 12/24/2010   Hyperlipidemia associated with type 2 diabetes mellitus (HCC) 01/13/2009   Anxiety state 01/13/2009   Essential hypertension 01/13/2009   OSA (obstructive sleep apnea) 01/13/2009    REFERRING DIAG: M19.011 (ICD-10-CM) - Arthritis of right shoulder region Z96.611 (ICD-10-CM) - S/P reverse total shoulder arthroplasty, right   THERAPY DIAG:  Right shoulder pain,  unspecified chronicity  Stiffness of right shoulder, not elsewhere classified  Rationale for Evaluation and Treatment Rehabilitation  PERTINENT HISTORY: S/P R reverse TSA on 08/10/2022. Has not yet had PT since then. Pt is R hand dominant. Was given pedulums to do at home following his procedure. Pt fell off a ladder 3 years ago. Underwent a RTC repair which did not hold, resulting in his current shoulder replacement surgery.   PRECAUTIONS: Reverse TSA precautions   SUBJECTIVE:   SUBJECTIVE STATEMENT: R shoulder is about a 4/10 currently. Was pretty sore after last session, put ice on it at home.  The exercises make his shoulder feel good.   PAIN:  Are you having pain? See subjective   TODAY'S TREATMENT:  DATE: 09/04/2022  R reverse TSA on 08/10/2022     Per surgeon note on 08/25/2022 "Plan is discontinue sling. Okay to very lightly lift with the operative extremity but no lifting anything heavier than a coffee cup or cell phone. Start physical therapy to focus on passive range of motion and active range of motion with deltoid isometrics. Do not want to externally rotate past 30 degrees to protect subscapularis repair. Follow-up in 4 weeks for clinical recheck. "     No latex allergies Blood pressure overall controlled by medication.    Surgical incision anterior shoulder/deltoid area, with steri strips, healing satisfactorily.      Therapeutic exercise  Pt currently on his 4th week post op    Supine R shoulder PROM             With L UE performing movement                          Flexion 10x3               With PT    Scaption 10x3                          Abduction 10x3    With R arm at side   ER 10x3, not past 30 degrees   Seated scapular retraction 10x3 with 5 second holds  Seated submax (less than 50% effort) deltoid isometrics in  neutral   Scaption 10x5 seconds    Abduction 10x5 seconds    Flexion 10x5 seconds    Extension 10x5 seconds      Improved exercise technique, movement at target joints, use of target muscles after mod verbal, visual, tactile cues.      Response to treatment No pain reported after session.        Clinical impression Pt currently on his 4th week post op. Continued working on PROM to decrease stiffness and added submax deltoid isometrics per MD instruction. Stiff end feel with R shoulder abduction PROM. No pain reported after session. Pt will benefit from continued skilled physical therapy services to improve ROM, strength and function.        PATIENT EDUCATION: Education details: there-ex, HEP Person educated: Patient Education method: Explanation, Demonstration, Tactile cues, Verbal cues, and Handouts Education comprehension: verbalized understanding and returned demonstration  HOME EXERCISE PROGRAM: Access Code: [email or web address] URL: [email or web address] Date: 08/30/2022 Prepared by: Loralyn Freshwater   Exercises - Supine Shoulder Flexion PROM  - 3 x daily - 7 x weekly - 3 sets - 10 reps   PT Short Term Goals - 08/30/22 1441       PT SHORT TERM GOAL #1   Title Pt will be independent with his initial HEP to improve R shoulder ROM, strength and function.    Baseline Pt has started his initial HEP (08/30/2022)    Time 3    Period Weeks    Status New    Target Date 09/22/22              PT Long Term Goals - 08/30/22 1446       PT LONG TERM GOAL #1   Title Pt will imrove R shoulder flexion and abduction AROM to 140 degrees or more to promote ability to reach with less difficulty.    Baseline R shoulder PROM 120 degrees flexion, 90 degrees abduction (08/30/2022)    Time 12    Period Weeks  Status New    Target Date 11/24/22      PT LONG TERM GOAL #2   Title Pt will improve R shoulder ER AROM to at least 70 degrees to promote ability to reach behind  his head.    Baseline R shoulder PROM ER with arm at side 30 degrees (08/30/2022)    Time 12    Period Weeks    Status New    Target Date 11/24/22      PT LONG TERM GOAL #3   Title Pt will have at least 4/5 R shoulder flexion, abduction, ER and IR strength to promote ability to use his R UE for functional tasks.    Baseline R shoulder strength not yet tested to allow for more healing. (08/30/2022)    Time 12    Period Weeks    Status New    Target Date 11/24/22      PT LONG TERM GOAL #4   Title Pt will improve his R shoulder FOTO score by at least 10 points as a demonstration of improved function.    Baseline R shoulder FOTO 39 (08/30/2022)    Time 12    Period Weeks    Status New    Target Date 11/24/22              Plan - 09/04/22 1411     Clinical Impression Statement Pt currently on his 4th week post op. Continued working on PROM to decrease stiffness and added submax deltoid isometrics per MD instruction. Stiff end feel with R shoulder abduction PROM. No pain reported after session. Pt will benefit from continued skilled physical therapy services to improve ROM, strength and function.    Personal Factors and Comorbidities Comorbidity 3+;Past/Current Experience;Time since onset of injury/illness/exacerbation;Fitness    Comorbidities Anxiety, depression, arthritis, DM, HTN, sleep apnea    Examination-Activity Limitations Bathing;Dressing;Bed Mobility;Hygiene/Grooming;Lift;Caring for Others;Carry;Reach Overhead;Toileting    Stability/Clinical Decision Making Stable/Uncomplicated    Clinical Decision Making Low    Rehab Potential Fair    PT Frequency 2x / week    PT Duration 12 weeks    PT Treatment/Interventions Therapeutic exercise;Therapeutic activities;Neuromuscular re-education;Patient/family education;Manual techniques;Electrical Stimulation;Passive range of motion    PT Next Visit Plan PROM, A/AROM, manual techniques, modalities PRN    PT Home Exercise Plan Medbridge  Access Code: [email or web address]    Consulted and Agree with Plan of Care Patient              Loralyn Freshwater PT, DPT  09/04/2022, 3:03 PM

## 2022-09-06 ENCOUNTER — Ambulatory Visit: Payer: Medicare HMO

## 2022-09-06 DIAGNOSIS — M25511 Pain in right shoulder: Secondary | ICD-10-CM

## 2022-09-06 DIAGNOSIS — M25611 Stiffness of right shoulder, not elsewhere classified: Secondary | ICD-10-CM | POA: Diagnosis not present

## 2022-09-06 DIAGNOSIS — M19011 Primary osteoarthritis, right shoulder: Secondary | ICD-10-CM | POA: Diagnosis not present

## 2022-09-06 DIAGNOSIS — Z96611 Presence of right artificial shoulder joint: Secondary | ICD-10-CM | POA: Diagnosis not present

## 2022-09-06 NOTE — Therapy (Signed)
OUTPATIENT PHYSICAL THERAPY TREATMENT NOTE   Patient Name: [name] MRN: [id number] DOB:February 04, 1966, 57 y.o., male Today's Date: 09/06/2022  PCP: [name], MD  REFERRING PROVIDER: Cammy Copa, MD   END OF SESSION:  PT End of Session - 09/06/22 1300     Visit Number 3    Number of Visits 25    Date for PT Re-Evaluation 11/24/22    PT Start Time 1300    PT Stop Time 1340    PT Time Calculation (min) 40 min    Activity Tolerance Patient tolerated treatment well    Behavior During Therapy [facility] for tasks assessed/performed              Past Medical History:  Diagnosis Date   Acute medial meniscus tear of left knee    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   Anxiety    Anxiety and depression    Arthritis    knees and shoulders   Benzodiazepine withdrawal with complication (HCC) 01/09/2019   Bruit    L   Chest pain    hx   Cognitive complaints with normal neuropsychological exam 10/23/2016   Diabetes mellitus without complication (HCC)    "borderline", diet controlled, no meds, patient has lost 30 lbs   Edema    Fatty liver    GERD (gastroesophageal reflux disease)    uses Omeprazole   Goiter    HLD (hyperlipidemia)    Hypertension    essential, benign   Hypothyroidism    Impotence of organic origin    Murmur    never has caused any problems   Neuromuscular disorder (HCC)    neuropathy bilateral feet   Other chest pain    tightness, pressure   Palpitation    hx   Precordial pain    Sleep apnea    uses cpap   Past Surgical History:  Procedure Laterality Date   APPENDECTOMY  2002   done at [facility]   CARDIAC CATHETERIZATION     CARDIAC CATHETERIZATION N/A 08/27/2015   Procedure: Left Heart Cath and Coronary Angiography;  Surgeon: Dolores Patty, MD;  Location: [facility] INVASIVE CV LAB;  Service: Cardiovascular;  Laterality: N/A;   COLONOSCOPY  in his 20's   COLONOSCOPY  2019   EAR CYST EXCISION Left 07/08/2019    Procedure: open excision baker's cyst left knee;  Surgeon: Cammy Copa, MD;  Location: [facility] OR;  Service: Orthopedics;  Laterality: Left;   HERNIA REPAIR  05/15/2013   umbilical   JOINT REPLACEMENT     right knee   KNEE ARTHROSCOPY Left 12/17/2018   Procedure: left knee arthroscopy, meniscal debridement, loose body removal;  Surgeon: Cammy Copa, MD;  Location: [facility] OR;  Service: Orthopedics;  Laterality: Left;   REVERSE SHOULDER ARTHROPLASTY Right 08/10/2022   Procedure: RIGHT REVERSE SHOULDER ARTHROPLASTY, BICEPS TENODESIS;  Surgeon: Cammy Copa, MD;  Location: [facility] OR;  Service: Orthopedics;  Laterality: Right;   REVISION TOTAL KNEE ARTHROPLASTY Right 06/23/2014   DR [name]   SHOULDER ARTHROSCOPY WITH ROTATOR CUFF REPAIR AND SUBACROMIAL DECOMPRESSION Right 03/07/2022   Procedure: RIGHT SHOULDER ARTHROSCOPY, DEBRIDEMENT, MINI OPEN ROTATOR CUFF TEAR REPAIR;  Surgeon: Cammy Copa, MD;  Location: [facility] OR;  Service: Orthopedics;  Laterality: Right;   TONSILLECTOMY     TOTAL KNEE ARTHROPLASTY Right 2011   right   TOTAL KNEE ARTHROPLASTY Left 02/10/2021   Procedure: LEFT TOTAL KNEE ARTHROPLASTY;  Surgeon: Cammy Copa, MD;  Location: [facility] OR;  Service: Orthopedics;  Laterality: Left;   TOTAL KNEE REVISION Right 06/23/2014   Procedure: TOTAL KNEE REVISION;  Surgeon: Cammy Copa, MD;  Location: [facility] OR;  Service: Orthopedics;  Laterality: Right;   UPPER GASTROINTESTINAL ENDOSCOPY  04/30/2013   Demetrius Charity TOOTH EXTRACTION     Patient Active Problem List   Diagnosis Date Noted   Arthritis of right shoulder region 08/20/2022   Retained orthopedic hardware 08/20/2022   Alcohol withdrawal delirium (HCC) 08/13/2022   S/P reverse total shoulder arthroplasty, right 08/10/2022   Pernicious anemia 10/12/2021   Chronic pain associated with significant psychosocial dysfunction 03/05/2021   History of deep venous thrombosis (DVT) of distal vein of left lower  extremity 03/05/2021   Arthritis of left knee    S/P TKR (total knee replacement), left 02/10/2021   Grief counseling 11/14/2020   Abnormal electrocardiogram (ECG) (EKG) 09/02/2020   Preoperative evaluation to rule out surgical contraindication 09/02/2020   MDD (major depressive disorder), recurrent episode, moderate (HCC) 05/29/2020   Colon cancer screening 02/02/2020   Eczema of both external ears 12/08/2019   1+ pitting edema 12/08/2019   Polyarthritis of multiple sites 10/27/2019   CAD (coronary artery disease) 10/27/2019   Radiculopathy, cervical region 06/10/2019   Carpal tunnel syndrome, left upper limb 06/10/2019   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   Spinal stenosis of cervical region 12/31/2018   Loose body of left knee    Plantar fasciitis of left foot 08/24/2017   Chronic pain of both hips 06/20/2017   Nontraumatic complete tear of right rotator cuff 09/14/2015   Hypothyroidism due to acquired atrophy of thyroid 09/14/2015   Arthritis of knee 06/23/2014   Encounter for preventive health examination 06/17/2014   Enlarged RV (right ventricle) 06/17/2014   GERD (gastroesophageal reflux disease) 04/20/2014   Cervical spine degeneration 02/17/2014   Overweight 10/29/2013   Chronic knee pain 10/29/2013   Type 2 diabetes mellitus with diabetic neuropathy, unspecified (HCC) 10/23/2012   Seborrheic dermatitis, unspecified 05/17/2012   Fatty liver 04/22/2012   Hypogonadism male 03/12/2012   Chronic pain of multiple joints 12/24/2010   Sciatica of left side associated with disorder of lumbar spine 12/24/2010   Hyperlipidemia associated with type 2 diabetes mellitus (HCC) 01/13/2009   Anxiety state 01/13/2009   Essential hypertension 01/13/2009   OSA (obstructive sleep apnea) 01/13/2009    REFERRING DIAG: M19.011 (ICD-10-CM) - Arthritis of right shoulder region Z96.611 (ICD-10-CM) - S/P reverse total shoulder arthroplasty, right   THERAPY DIAG:  Right shoulder pain,  unspecified chronicity  Stiffness of right shoulder, not elsewhere classified  Rationale for Evaluation and Treatment Rehabilitation  PERTINENT HISTORY: S/P R reverse TSA on 08/10/2022. Has not yet had PT since then. Pt is R hand dominant. Was given pedulums to do at home following his procedure. Pt fell off a ladder 3 years ago. Underwent a RTC repair which did not hold, resulting in his current shoulder replacement surgery.   PRECAUTIONS: Reverse TSA precautions   SUBJECTIVE:   SUBJECTIVE STATEMENT: R shoulder is about a 4-5/10 currently. Was ok after last session, put ice on it.     PAIN:  Are you having pain? See subjective   TODAY'S TREATMENT:  DATE: 09/06/2022  R reverse TSA on 08/10/2022     Per surgeon note on 08/25/2022 "Plan is discontinue sling. Okay to very lightly lift with the operative extremity but no lifting anything heavier than a coffee cup or cell phone. Start physical therapy to focus on passive range of motion and active range of motion with deltoid isometrics. Do not want to externally rotate past 30 degrees to protect subscapularis repair. Follow-up in 4 weeks for clinical recheck. "     No latex allergies Blood pressure overall controlled by medication.    Surgical incision anterior shoulder/deltoid area, with steri strips, healing satisfactorily.      Therapeutic exercise  Pt currently on his 4th week post op    Supine R shoulder PROM             With L UE performing movement                          Flexion 10x3               With PT    Scaption 10x3                          Abduction 10x3    With R arm at side   ER 10x3, not past 30 degrees  Seated scapular retraction 10x3 with 5 second holds  Seated submax (less than 50% effort) deltoid isometrics in neutral     Scaption 10x5 seconds for 2  sets   Abduction 10x5 seconds for 2 sets   Flexion 10x5 seconds for 2 sets   Extension 10x5 seconds for 2 sets      Improved exercise technique, movement at target joints, use of target muscles after mod verbal, visual, tactile cues.      Response to treatment Pt tolerated session well without aggravation of symptoms.        Clinical impression Pt currently on his 4th week post op. Continued working on PROM to decrease stiffness and added submax deltoid isometrics per MD instruction. No pain reported after session. Pt will benefit from continued skilled physical therapy services to improve ROM, strength and function.        PATIENT EDUCATION: Education details: there-ex, HEP Person educated: Patient Education method: Explanation, Demonstration, Tactile cues, Verbal cues, and Handouts Education comprehension: verbalized understanding and returned demonstration  HOME EXERCISE PROGRAM: Access Code: [email or web address] URL: [email or web address] Date: 08/30/2022 Prepared by: Loralyn Freshwater   Exercises - Supine Shoulder Flexion PROM  - 3 x daily - 7 x weekly - 3 sets - 10 reps   PT Short Term Goals - 08/30/22 1441       PT SHORT TERM GOAL #1   Title Pt will be independent with his initial HEP to improve R shoulder ROM, strength and function.    Baseline Pt has started his initial HEP (08/30/2022)    Time 3    Period Weeks    Status New    Target Date 09/22/22              PT Long Term Goals - 08/30/22 1446       PT LONG TERM GOAL #1   Title Pt will imrove R shoulder flexion and abduction AROM to 140 degrees or more to promote ability to reach with less difficulty.    Baseline R shoulder PROM 120 degrees flexion, 90 degrees abduction (08/30/2022)    Time 12  Period Weeks    Status New    Target Date 11/24/22      PT LONG TERM GOAL #2   Title Pt will improve R shoulder ER AROM to at least 70 degrees to promote ability to reach behind his head.     Baseline R shoulder PROM ER with arm at side 30 degrees (08/30/2022)    Time 12    Period Weeks    Status New    Target Date 11/24/22      PT LONG TERM GOAL #3   Title Pt will have at least 4/5 R shoulder flexion, abduction, ER and IR strength to promote ability to use his R UE for functional tasks.    Baseline R shoulder strength not yet tested to allow for more healing. (08/30/2022)    Time 12    Period Weeks    Status New    Target Date 11/24/22      PT LONG TERM GOAL #4   Title Pt will improve his R shoulder FOTO score by at least 10 points as a demonstration of improved function.    Baseline R shoulder FOTO 39 (08/30/2022)    Time 12    Period Weeks    Status New    Target Date 11/24/22              Plan - 09/06/22 1254     Clinical Impression Statement Pt currently on his 4th week post op. Continued working on PROM to decrease stiffness and added submax deltoid isometrics per MD instruction. No pain reported after session. Pt will benefit from continued skilled physical therapy services to improve ROM, strength and function.    Personal Factors and Comorbidities Comorbidity 3+;Past/Current Experience;Time since onset of injury/illness/exacerbation;Fitness    Comorbidities Anxiety, depression, arthritis, DM, HTN, sleep apnea    Examination-Activity Limitations Bathing;Dressing;Bed Mobility;Hygiene/Grooming;Lift;Caring for Others;Carry;Reach Overhead;Toileting    Stability/Clinical Decision Making Stable/Uncomplicated    Rehab Potential Fair    PT Frequency 2x / week    PT Duration 12 weeks    PT Treatment/Interventions Therapeutic exercise;Therapeutic activities;Neuromuscular re-education;Patient/family education;Manual techniques;Electrical Stimulation;Passive range of motion    PT Next Visit Plan PROM, A/AROM, manual techniques, modalities PRN    PT Home Exercise Plan Medbridge Access Code: [email or web address]    Consulted and Agree with Plan of Care Patient               Loralyn Freshwater PT, DPT  09/06/2022, 4:29 PM

## 2022-09-09 ENCOUNTER — Other Ambulatory Visit: Payer: Self-pay | Admitting: Orthopedic Surgery

## 2022-09-10 ENCOUNTER — Other Ambulatory Visit: Payer: Self-pay | Admitting: Internal Medicine

## 2022-09-11 ENCOUNTER — Emergency Department
Admission: EM | Admit: 2022-09-11 | Discharge: 2022-09-11 | Disposition: A | Discharge status: Home / Self Care | Payer: Medicare HMO | Attending: Emergency Medicine | Admitting: Emergency Medicine

## 2022-09-11 ENCOUNTER — Telehealth: Payer: Self-pay | Admitting: Internal Medicine

## 2022-09-11 ENCOUNTER — Emergency Department: Payer: Medicare HMO

## 2022-09-11 ENCOUNTER — Encounter: Payer: Self-pay | Admitting: Emergency Medicine

## 2022-09-11 ENCOUNTER — Other Ambulatory Visit: Payer: Self-pay

## 2022-09-11 DIAGNOSIS — I2510 Atherosclerotic heart disease of native coronary artery without angina pectoris: Secondary | ICD-10-CM | POA: Diagnosis not present

## 2022-09-11 DIAGNOSIS — E119 Type 2 diabetes mellitus without complications: Secondary | ICD-10-CM | POA: Insufficient documentation

## 2022-09-11 DIAGNOSIS — R030 Elevated blood-pressure reading, without diagnosis of hypertension: Secondary | ICD-10-CM | POA: Diagnosis present

## 2022-09-11 DIAGNOSIS — I10 Essential (primary) hypertension: Secondary | ICD-10-CM | POA: Insufficient documentation

## 2022-09-11 DIAGNOSIS — R519 Headache, unspecified: Secondary | ICD-10-CM | POA: Insufficient documentation

## 2022-09-11 DIAGNOSIS — E039 Hypothyroidism, unspecified: Secondary | ICD-10-CM | POA: Diagnosis not present

## 2022-09-11 LAB — BASIC METABOLIC PANEL
Anion gap: 9 (ref 5–15)
BUN: 14 mg/dL (ref 6–20)
CO2: 28 mmol/L (ref 22–32)
Calcium: 8.6 mg/dL — ABNORMAL LOW (ref 8.9–10.3)
Chloride: 100 mmol/L (ref 98–111)
Creatinine, Ser: 1.02 mg/dL (ref 0.61–1.24)
GFR, Estimated: >60 mL/min (ref >60)
Glucose, Bld: 101 mg/dL — ABNORMAL HIGH (ref 70–99)
Potassium: 3.9 mmol/L (ref 3.5–5.1)
Sodium: 137 mmol/L (ref 135–145)

## 2022-09-11 LAB — CBC
HCT: 41.3 % (ref 39.0–52.0)
Hemoglobin: 13.6 g/dL (ref 13.0–17.0)
MCH: 30.6 pg (ref 26.0–34.0)
MCHC: 32.9 g/dL (ref 30.0–36.0)
MCV: 92.8 fL (ref 80.0–100.0)
Platelets: 268 10*3/uL (ref 150–400)
RBC: 4.45 MIL/uL (ref 4.22–5.81)
RDW: 13.3 % (ref 11.5–15.5)
WBC: 6.4 10*3/uL (ref 4.0–10.5)
nRBC: 0 % (ref 0.0–0.2)

## 2022-09-11 LAB — TROPONIN I (HIGH SENSITIVITY): Troponin I (High Sensitivity): 7 ng/L (ref <18)

## 2022-09-11 NOTE — Telephone Encounter (Signed)
Pt called stating his BP has been high 213/90. Sent to access nurse

## 2022-09-11 NOTE — ED Triage Notes (Signed)
Pt to ED for HTN for one week. Hx of same and takes meds. +h/a.

## 2022-09-11 NOTE — Discharge Instructions (Signed)
Your blood work, chest x-ray, and CT scan were normal.  Please take your blood pressure medication prior to checking her blood pressure at home.  Please follow-up with your primary care provider this week for blood pressure recheck.  Please return for any new, worsening, or change in symptoms or other concerns. It was a pleasure caring for you today.

## 2022-09-11 NOTE — Telephone Encounter (Signed)
Pt was advised by access nurse to go the ED.

## 2022-09-11 NOTE — ED Provider Notes (Signed)
[facility] Provider Note    Event Date/Time   First MD Initiated Contact with Patient 09/11/22 1046     (approximate)   History   Hypertension   HPI  [name] is a 57 y.o. male who presents today for evaluation of elevated blood pressure.  Patient reports that he recently had his blood pressure medications adjusted.  He has been taking his blood pressure in the morning when he wakes up.  He noticed that his blood pressure was elevated to 200s systolic this morning prompting him to come into the emergency department for evaluation.  Patient notes that he took this blood pressure prior to taking his morning meds.  He has not had any chest pain or shortness of breath.  He has not had any visual changes.  He reports mild headache this morning but this has since resolved.  No weakness or paresthesias.  Patient Active Problem List   Diagnosis Date Noted   Arthritis of right shoulder region 08/20/2022   Retained orthopedic hardware 08/20/2022   Alcohol withdrawal delirium (HCC) 08/13/2022   S/P reverse total shoulder arthroplasty, right 08/10/2022   Pernicious anemia 10/12/2021   Chronic pain associated with significant psychosocial dysfunction 03/05/2021   History of deep venous thrombosis (DVT) of distal vein of left lower extremity 03/05/2021   Arthritis of left knee    S/P TKR (total knee replacement), left 02/10/2021   Grief counseling 11/14/2020   Abnormal electrocardiogram (ECG) (EKG) 09/02/2020   Preoperative evaluation to rule out surgical contraindication 09/02/2020   MDD (major depressive disorder), recurrent episode, moderate (HCC) 05/29/2020   Colon cancer screening 02/02/2020   Eczema of both external ears 12/08/2019   1+ pitting edema 12/08/2019   Polyarthritis of multiple sites 10/27/2019   CAD (coronary artery disease) 10/27/2019   Radiculopathy, cervical region 06/10/2019   Carpal tunnel syndrome, left upper limb 06/10/2019    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   Spinal stenosis of cervical region 12/31/2018   Loose body of left knee    Plantar fasciitis of left foot 08/24/2017   Chronic pain of both hips 06/20/2017   Nontraumatic complete tear of right rotator cuff 09/14/2015   Hypothyroidism due to acquired atrophy of thyroid 09/14/2015   Arthritis of knee 06/23/2014   Encounter for preventive health examination 06/17/2014   Enlarged RV (right ventricle) 06/17/2014   GERD (gastroesophageal reflux disease) 04/20/2014   Cervical spine degeneration 02/17/2014   Overweight 10/29/2013   Chronic knee pain 10/29/2013   Type 2 diabetes mellitus with diabetic neuropathy, unspecified (HCC) 10/23/2012   Seborrheic dermatitis, unspecified 05/17/2012   Fatty liver 04/22/2012   Hypogonadism male 03/12/2012   Chronic pain of multiple joints 12/24/2010   Sciatica of left side associated with disorder of lumbar spine 12/24/2010   Hyperlipidemia associated with type 2 diabetes mellitus (HCC) 01/13/2009   Anxiety state 01/13/2009   Essential hypertension 01/13/2009   OSA (obstructive sleep apnea) 01/13/2009          Physical Exam   Triage Vital Signs: ED Triage Vitals  Enc Vitals Group     BP 09/11/22 1022 (!) 158/106     Pulse Rate 09/11/22 1022 72     Resp 09/11/22 1022 18     Temp 09/11/22 1021 98.7 F (37.1 C)     Temp src --      SpO2 09/11/22 1022 96 %     Weight 09/11/22 1022 231 lb (104.8 kg)     Height 09/11/22 1022  6' (1.829 m)     Head Circumference --      Peak Flow --      Pain Score 09/11/22 1021 6     Pain Loc --      Pain Edu? --      Excl. in GC? --     Most recent vital signs: Vitals:   09/11/22 1022 09/11/22 1239  BP: (!) 158/106 (!) 162/99  Pulse: 72 84  Resp: 18 18  Temp:    SpO2: 96% 98%    Physical Exam Vitals and nursing note reviewed.  Constitutional:      General: Awake and alert. No acute distress.    Appearance: Normal appearance. The patient is overweight.   HENT:     Head: Normocephalic and atraumatic.     Mouth: Mucous membranes are moist.  Eyes:     General: PERRL. Normal EOMs        Right eye: No discharge.        Left eye: No discharge.     Conjunctiva/sclera: Conjunctivae normal.  Cardiovascular:     Rate and Rhythm: Normal rate and regular rhythm.     Pulses: Normal pulses.  Pulmonary:     Effort: Pulmonary effort is normal. No respiratory distress.     Breath sounds: Normal breath sounds.  Abdominal:     Abdomen is soft. There is no abdominal tenderness. No rebound or guarding. No distention. Musculoskeletal:        General: No swelling. Normal range of motion.     Cervical back: Normal range of motion and neck supple.  Skin:    General: Skin is warm and dry.     Capillary Refill: Capillary refill takes less than 2 seconds.     Findings: No rash.  Neurological:     Mental Status: The patient is awake and alert.   Neurological: GCS 15 alert and oriented x3 Normal speech, no expressive or receptive aphasia or dysarthria Cranial nerves II through XII intact Normal visual fields 5 out of 5 strength in all 4 extremities with intact sensation throughout No extremity drift Normal finger-to-nose testing, no limb or truncal ataxia    ED Results / Procedures / Treatments   Labs (all labs ordered are listed, but only abnormal results are displayed) Labs Reviewed  BASIC METABOLIC PANEL - Abnormal; Notable for the following components:      Result Value   Glucose, Bld 101 (*)    Calcium 8.6 (*)    All other components within normal limits  CBC  TROPONIN I (HIGH SENSITIVITY)     EKG     RADIOLOGY I independently reviewed and interpreted imaging and agree with radiologists findings.     PROCEDURES:  Critical Care performed:   Procedures   MEDICATIONS ORDERED IN ED: Medications - No data to display   IMPRESSION / MDM / ASSESSMENT AND PLAN / ED COURSE  I reviewed the triage vital signs and the nursing  notes.   Differential diagnosis includes, but is not limited to, hypertension, hypertensive urgency, hypertensive emergency, parenchymal hemorrhage, endorgan damage.  I reviewed the patient's chart.  Patient has a known right rotator cuff tear and had repair in November.  He had a recent MRI that reveals rotator cuff tendon tear with retraction involving the supraspinatus and infraspinatus.  Per the orthopedic note, the tendons are retracted and do not necessarily look repairable, and therefore patient is a candidate for tendon transfer or a reverse replacement, and the options were  discussed with the patient who prefers a reverse replacement.  In the meantime, he has been participating with physical therapy.  Patient called his PCP today to relay that his blood pressure was quite elevated, he reported that it was 213/90.  He was instructed to come to the emergency department.  Patient also had a recent appointment with his primary care doctor on 08/16/2022 and had his medications adjusted for his hypertension.  He takes hydralazine and labetalol as needed per PCP note.  Patient presents the emergency department awake and alert, hemodynamically stable and afebrile.  He is nontoxic in appearance.  He has nonfocal neurological exam.  His blood pressure is mildly elevated to 158/106.  He is currently asymptomatic.  Labs obtained are overall reassuring.  There is no evidence of endorgan damage.  CT head obtained does not demonstrate any parenchymal hemorrhage or intracranial hemorrhage.  He was reminded of his as needed medications of which he has not taken any.  He was also advised to check his blood pressure after taking his morning blood pressure medications.  We discussed strict return precautions.  Patient was instructed to see his PCP this week for blood pressure recheck.  He understands return precautions in the meantime.  He was discharged in stable condition.  Patient's presentation is most consistent  with exacerbation of chronic illness.   FINAL CLINICAL IMPRESSION(S) / ED DIAGNOSES   Final diagnoses:  Hypertension, unspecified type     Rx / DC Orders   ED Discharge Orders     None        Note:  This document was prepared using Dragon voice recognition software and may include unintentional dictation errors.   Jackelyn Hoehn, PA-C 09/11/22 1447    Corena Herter, MD 09/11/22 [phone]

## 2022-09-12 ENCOUNTER — Encounter: Payer: Self-pay | Admitting: Internal Medicine

## 2022-09-12 ENCOUNTER — Ambulatory Visit: Payer: Medicare HMO

## 2022-09-12 DIAGNOSIS — M25611 Stiffness of right shoulder, not elsewhere classified: Secondary | ICD-10-CM

## 2022-09-12 DIAGNOSIS — M25511 Pain in right shoulder: Secondary | ICD-10-CM

## 2022-09-12 NOTE — Therapy (Signed)
OUTPATIENT PHYSICAL THERAPY TREATMENT NOTE   Patient Name: [name] MRN: [id number] DOB:05-17-1965, 57 y.o., male Today's Date: 09/12/2022  PCP: [name], MD  REFERRING PROVIDER: Cammy Copa, MD   END OF SESSION:  PT End of Session - 09/12/22 1436     Visit Number 4    Number of Visits 25    Date for PT Re-Evaluation 11/24/22    PT Start Time 1436    PT Stop Time 1443    PT Time Calculation (min) 7 min    Activity Tolerance Patient tolerated treatment well    Behavior During Therapy [facility] for tasks assessed/performed               Past Medical History:  Diagnosis Date   Acute medial meniscus tear of left knee    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   Anxiety    Anxiety and depression    Arthritis    knees and shoulders   Benzodiazepine withdrawal with complication (HCC) 01/09/2019   Bruit    L   Chest pain    hx   Cognitive complaints with normal neuropsychological exam 10/23/2016   Diabetes mellitus without complication (HCC)    "borderline", diet controlled, no meds, patient has lost 30 lbs   Edema    Fatty liver    GERD (gastroesophageal reflux disease)    uses Omeprazole   Goiter    HLD (hyperlipidemia)    Hypertension    essential, benign   Hypothyroidism    Impotence of organic origin    Murmur    never has caused any problems   Neuromuscular disorder (HCC)    neuropathy bilateral feet   Other chest pain    tightness, pressure   Palpitation    hx   Precordial pain    Sleep apnea    uses cpap   Past Surgical History:  Procedure Laterality Date   APPENDECTOMY  2002   done at [facility]   CARDIAC CATHETERIZATION     CARDIAC CATHETERIZATION N/A 08/27/2015   Procedure: Left Heart Cath and Coronary Angiography;  Surgeon: Dolores Patty, MD;  Location: [facility] INVASIVE CV LAB;  Service: Cardiovascular;  Laterality: N/A;   COLONOSCOPY  in his 20's   COLONOSCOPY  2019   EAR CYST EXCISION Left 07/08/2019    Procedure: open excision baker's cyst left knee;  Surgeon: Cammy Copa, MD;  Location: [facility] OR;  Service: Orthopedics;  Laterality: Left;   HERNIA REPAIR  05/15/2013   umbilical   JOINT REPLACEMENT     right knee   KNEE ARTHROSCOPY Left 12/17/2018   Procedure: left knee arthroscopy, meniscal debridement, loose body removal;  Surgeon: Cammy Copa, MD;  Location: [facility] OR;  Service: Orthopedics;  Laterality: Left;   REVERSE SHOULDER ARTHROPLASTY Right 08/10/2022   Procedure: RIGHT REVERSE SHOULDER ARTHROPLASTY, BICEPS TENODESIS;  Surgeon: Cammy Copa, MD;  Location: [facility] OR;  Service: Orthopedics;  Laterality: Right;   REVISION TOTAL KNEE ARTHROPLASTY Right 06/23/2014   DR [name]   SHOULDER ARTHROSCOPY WITH ROTATOR CUFF REPAIR AND SUBACROMIAL DECOMPRESSION Right 03/07/2022   Procedure: RIGHT SHOULDER ARTHROSCOPY, DEBRIDEMENT, MINI OPEN ROTATOR CUFF TEAR REPAIR;  Surgeon: Cammy Copa, MD;  Location: [facility] OR;  Service: Orthopedics;  Laterality: Right;   TONSILLECTOMY     TOTAL KNEE ARTHROPLASTY Right 2011   right   TOTAL KNEE ARTHROPLASTY Left 02/10/2021   Procedure: LEFT TOTAL KNEE ARTHROPLASTY;  Surgeon: Cammy Copa, MD;  Location: [facility] OR;  Service: Orthopedics;  Laterality: Left;   TOTAL KNEE REVISION Right 06/23/2014   Procedure: TOTAL KNEE REVISION;  Surgeon: Cammy Copa, MD;  Location: [facility] OR;  Service: Orthopedics;  Laterality: Right;   UPPER GASTROINTESTINAL ENDOSCOPY  04/30/2013   Demetrius Charity TOOTH EXTRACTION     Patient Active Problem List   Diagnosis Date Noted   Arthritis of right shoulder region 08/20/2022   Retained orthopedic hardware 08/20/2022   Alcohol withdrawal delirium (HCC) 08/13/2022   S/P reverse total shoulder arthroplasty, right 08/10/2022   Pernicious anemia 10/12/2021   Chronic pain associated with significant psychosocial dysfunction 03/05/2021   History of deep venous thrombosis (DVT) of distal vein of left lower  extremity 03/05/2021   Arthritis of left knee    S/P TKR (total knee replacement), left 02/10/2021   Grief counseling 11/14/2020   Abnormal electrocardiogram (ECG) (EKG) 09/02/2020   Preoperative evaluation to rule out surgical contraindication 09/02/2020   MDD (major depressive disorder), recurrent episode, moderate (HCC) 05/29/2020   Colon cancer screening 02/02/2020   Eczema of both external ears 12/08/2019   1+ pitting edema 12/08/2019   Polyarthritis of multiple sites 10/27/2019   CAD (coronary artery disease) 10/27/2019   Radiculopathy, cervical region 06/10/2019   Carpal tunnel syndrome, left upper limb 06/10/2019   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   Spinal stenosis of cervical region 12/31/2018   Loose body of left knee    Plantar fasciitis of left foot 08/24/2017   Chronic pain of both hips 06/20/2017   Nontraumatic complete tear of right rotator cuff 09/14/2015   Hypothyroidism due to acquired atrophy of thyroid 09/14/2015   Arthritis of knee 06/23/2014   Encounter for preventive health examination 06/17/2014   Enlarged RV (right ventricle) 06/17/2014   GERD (gastroesophageal reflux disease) 04/20/2014   Cervical spine degeneration 02/17/2014   Overweight 10/29/2013   Chronic knee pain 10/29/2013   Type 2 diabetes mellitus with diabetic neuropathy, unspecified (HCC) 10/23/2012   Seborrheic dermatitis, unspecified 05/17/2012   Fatty liver 04/22/2012   Hypogonadism male 03/12/2012   Chronic pain of multiple joints 12/24/2010   Sciatica of left side associated with disorder of lumbar spine 12/24/2010   Hyperlipidemia associated with type 2 diabetes mellitus (HCC) 01/13/2009   Anxiety state 01/13/2009   Essential hypertension 01/13/2009   OSA (obstructive sleep apnea) 01/13/2009    REFERRING DIAG: M19.011 (ICD-10-CM) - Arthritis of right shoulder region Z96.611 (ICD-10-CM) - S/P reverse total shoulder arthroplasty, right   THERAPY DIAG:  Right shoulder pain,  unspecified chronicity  Stiffness of right shoulder, not elsewhere classified  Rationale for Evaluation and Treatment Rehabilitation  PERTINENT HISTORY: S/P R reverse TSA on 08/10/2022. Has not yet had PT since then. Pt is R hand dominant. Was given pedulums to do at home following his procedure. Pt fell off a ladder 3 years ago. Underwent a RTC repair which did not hold, resulting in his current shoulder replacement surgery.   PRECAUTIONS: Reverse TSA precautions   SUBJECTIVE:   SUBJECTIVE STATEMENT: R shoulder is sore, kept him up yesterday. 6/10 currently. Took his blood pressure medicine but has still been running high. Going to talk to his doctor to see if his meds can change.    PAIN:  Are you having pain? See subjective   TODAY'S TREATMENT:  DATE: 09/12/2022  R reverse TSA on 08/10/2022     Per surgeon note on 08/25/2022 "Plan is discontinue sling. Okay to very lightly lift with the operative extremity but no lifting anything heavier than a coffee cup or cell phone. Start physical therapy to focus on passive range of motion and active range of motion with deltoid isometrics. Do not want to externally rotate past 30 degrees to protect subscapularis repair. Follow-up in 4 weeks for clinical recheck. "     No latex allergies Blood pressure overall controlled by medication.    Surgical incision anterior shoulder/deltoid area, with steri strips, healing satisfactorily.      Therapeutic exercise  Pt currently on his 5th  week post op   Blood pressure L arm sitting, mechanically taken, normal cuff: 204/98, HR 70   No light headedness or dizziness        Response to treatment Pt tolerated session well without aggravation of symptoms.        Clinical impression  Pt demonstrates BP L arm sitting, 204/98, HR 70. PT not performed secondary  to elevated levels not appropriate for exercise today. Pt was recommended to contact his doctor to help control his blood pressure. Pt verbalized understanding.        PATIENT EDUCATION: Education details: there-ex, HEP Person educated: Patient Education method: Explanation, Demonstration, Tactile cues, Verbal cues, and Handouts Education comprehension: verbalized understanding and returned demonstration  HOME EXERCISE PROGRAM: Access Code: [email or web address] URL: [email or web address] Date: 08/30/2022 Prepared by: Loralyn Freshwater   Exercises - Supine Shoulder Flexion PROM  - 3 x daily - 7 x weekly - 3 sets - 10 reps   PT Short Term Goals - 08/30/22 1441       PT SHORT TERM GOAL #1   Title Pt will be independent with his initial HEP to improve R shoulder ROM, strength and function.    Baseline Pt has started his initial HEP (08/30/2022)    Time 3    Period Weeks    Status New    Target Date 09/22/22              PT Long Term Goals - 08/30/22 1446       PT LONG TERM GOAL #1   Title Pt will imrove R shoulder flexion and abduction AROM to 140 degrees or more to promote ability to reach with less difficulty.    Baseline R shoulder PROM 120 degrees flexion, 90 degrees abduction (08/30/2022)    Time 12    Period Weeks    Status New    Target Date 11/24/22      PT LONG TERM GOAL #2   Title Pt will improve R shoulder ER AROM to at least 70 degrees to promote ability to reach behind his head.    Baseline R shoulder PROM ER with arm at side 30 degrees (08/30/2022)    Time 12    Period Weeks    Status New    Target Date 11/24/22      PT LONG TERM GOAL #3   Title Pt will have at least 4/5 R shoulder flexion, abduction, ER and IR strength to promote ability to use his R UE for functional tasks.    Baseline R shoulder strength not yet tested to allow for more healing. (08/30/2022)    Time 12    Period Weeks    Status New    Target Date 11/24/22      PT LONG TERM GOAL #4  Title Pt will improve his R shoulder FOTO score by at least 10 points as a demonstration of improved function.    Baseline R shoulder FOTO 39 (08/30/2022)    Time 12    Period Weeks    Status New    Target Date 11/24/22              Plan - 09/12/22 1434     Clinical Impression Statement Pt demonstrates BP L arm sitting, 204/98, HR 70. PT not performed secondary to elevated levels not appropriate for exercise today. Pt was recommended to contact his doctor to help control his blood pressure. Pt verbalized understanding.    Personal Factors and Comorbidities Comorbidity 3+;Past/Current Experience;Time since onset of injury/illness/exacerbation;Fitness    Comorbidities Anxiety, depression, arthritis, DM, HTN, sleep apnea    Examination-Activity Limitations Bathing;Dressing;Bed Mobility;Hygiene/Grooming;Lift;Caring for Others;Carry;Reach Overhead;Toileting    Stability/Clinical Decision Making Stable/Uncomplicated    Rehab Potential Fair    PT Frequency 2x / week    PT Duration 12 weeks    PT Treatment/Interventions Therapeutic exercise;Therapeutic activities;Neuromuscular re-education;Patient/family education;Manual techniques;Electrical Stimulation;Passive range of motion    PT Next Visit Plan PROM, A/AROM, manual techniques, modalities PRN    PT Home Exercise Plan Medbridge Access Code: [email or web address]    Consulted and Agree with Plan of Care Patient              Loralyn Freshwater PT, DPT  09/12/2022, 2:51 PM

## 2022-09-13 ENCOUNTER — Ambulatory Visit (INDEPENDENT_AMBULATORY_CARE_PROVIDER_SITE_OTHER): Payer: Medicare HMO | Admitting: Internal Medicine

## 2022-09-13 ENCOUNTER — Encounter: Payer: Self-pay | Admitting: Internal Medicine

## 2022-09-13 VITALS — BP 190/92 | HR 88 | Temp 98.0°F | Ht 72.0 in | Wt 225.6 lb

## 2022-09-13 DIAGNOSIS — E119 Type 2 diabetes mellitus without complications: Secondary | ICD-10-CM

## 2022-09-13 DIAGNOSIS — I10 Essential (primary) hypertension: Secondary | ICD-10-CM | POA: Diagnosis not present

## 2022-09-13 DIAGNOSIS — F411 Generalized anxiety disorder: Secondary | ICD-10-CM

## 2022-09-13 DIAGNOSIS — Z7984 Long term (current) use of oral hypoglycemic drugs: Secondary | ICD-10-CM

## 2022-09-13 DIAGNOSIS — G894 Chronic pain syndrome: Secondary | ICD-10-CM

## 2022-09-13 DIAGNOSIS — F331 Major depressive disorder, recurrent, moderate: Secondary | ICD-10-CM

## 2022-09-13 MED ORDER — HYDRALAZINE HCL 25 MG PO TABS: 25.0000 mg | ORAL_TABLET | Freq: Three times a day (TID) | ORAL | 2 refills | Status: DC

## 2022-09-13 MED ORDER — AMLODIPINE BESYLATE 5 MG PO TABS: 5.0000 mg | ORAL_TABLET | Freq: Every day | ORAL | 1 refills | Status: DC

## 2022-09-13 NOTE — Patient Instructions (Addendum)
Continue carvedilol 12.5   twice daily,  losartan in the evening,  imdur in the morning    Add amlodipine 5 mg in the  morning .  If BP is not < 150/90  after 3-4 doses .Marland Kitchen Increase dose  to 10 mg daily    I am calling in hydralazine to use  FOR ELEVATIONS   IN BETWEEN SCHEDULED MEDS   the dose of  25 MG CAN BE TAKEN  for readings > 150/90

## 2022-09-13 NOTE — Assessment & Plan Note (Addendum)
On prior visits he had Improved control on carvedilol 12.5 mg bid , imdur, losartan .  Current elevations are due in part to medication noncompliance adding amlodipine 5 mg daily ,  will increase to 10 mg if BP is not < 130/80 in  2 weeks    Lab Results  Component Value Date   CREATININE 1.02 09/11/2022   Lab Results  Component Value Date   NA 137 09/11/2022   K 3.9 09/11/2022   CL 100 09/11/2022   CO2 28 09/11/2022

## 2022-09-13 NOTE — Telephone Encounter (Signed)
Pt was seen today and Dr. Darrick Huntsman was made aware.

## 2022-09-13 NOTE — Progress Notes (Signed)
Subjective:  Patient ID: [name], male    DOB: 1965-05-22  Age: 57 y.o. MRN: [id number]  CC: The primary encounter diagnosis was Essential hypertension. Diagnoses of Diabetes mellitus treated with oral medication (HCC), MDD (major depressive disorder), recurrent episode, moderate (HCC), Anxiety state, and Chronic pain associated with significant psychosocial dysfunction were also pertinent to this visit.   HPI [name] presents for  Chief Complaint  Patient presents with   Medical Management of Chronic Issues    Elevated blood pressure    1) 57 yr old male with depression , obesity/HTN/DM ,chronic pain and CAD.  presents for management of  elevated blood pressure readings for the last several days,.  [name] was directed to the ER  ER on Monday  after calling office with home reading of 213 systolic  accompanied by a headache.  In ER    CT head  ,  cardiac enzymes were both non diagnostic . Repeat BP was lower (systolic 156) and he was discharged home without medication changes.  Regimen prior to today was supposed to be carvedilol 12.5 mg bid . Losartan 100 mg daily and Imdur 30 mg daily , with hydralazine and labetalol prescribed for prn use.  However,  he states he does  not have  the prn medications and and has been taking the daily scheduled  medications inconsistently due to forgetfulness.  He has been under increased emotional stress  due to the increased care needs of his mother  who  has been diagnosed with dementia and  lives at home,  but cannot cook.  Mother has a daytime sitter ; he and is  brother  have been taking turns staying with her in the evenings,  and he forgot to take yesterday evenings medications    Medications were taken this morning at 6 am .  His home BP machine notes an average  bp of 177/92   2) Chronic shoulder pain  s/p right reverse shoulder arthroscopy on April   18 .  He is receiving outpatient PT.    3) Depression/anxiety:  taking wellbutrin,   buspirone and abilify.   Still using clonazepam bid   4) Chronic opioid use:  database reviewed.  Post operative prescriptions for oxycodone IR 5 mg dose occurred on April 20 (qty 30) and May 3  (20) .last hydrocodone /APAP 10/325 mg refill was done on Feb 8 qty #60     Outpatient Medications Prior to Visit  Medication Sig Dispense Refill   ARIPiprazole (ABILIFY) 5 MG tablet Take one tab at bed time 90 tablet 0   aspirin EC 81 MG tablet Take 1 tablet (81 mg total) by mouth daily at 6 PM. Swallow whole. 30 tablet 12   atorvastatin (LIPITOR) 80 MG tablet Take 1 tablet (80 mg total) by mouth daily. 180 tablet 3   Blood Glucose Monitoring Suppl (ACCU-CHEK AVIVA PLUS) w/Device KIT Use to check blood sugars up to 4 times daily. ICD-10 E11.40 1 kit 0   buPROPion (WELLBUTRIN XL) 300 MG 24 hr tablet Take 1 tablet (300 mg total) by mouth daily. 90 tablet 0   busPIRone (BUSPAR) 15 MG tablet Take 1 tablet (15 mg total) by mouth 3 (three) times daily. 270 tablet 1   carvedilol (COREG) 12.5 MG tablet Take 1 tablet (12.5 mg total) by mouth 2 (two) times daily. 180 tablet 3   celecoxib (CELEBREX) 100 MG capsule Take 1 capsule by mouth twice daily 60 capsule 0  clonazePAM (KLONOPIN) 2 MG tablet TAKE 1/2 (ONE-HALF) TABLET BY MOUTH TWICE DAILY AS NEEDED FOR ANXIETY 30 tablet 2   dicyclomine (BENTYL) 20 MG tablet TAKE 1 TABLET BY MOUTH EVERY 6 HOURS 120 tablet 0   docusate sodium (COLACE) 100 MG capsule Take 1 capsule (100 mg total) by mouth 2 (two) times daily. 10 capsule 0   doxycycline (VIBRA-TABS) 100 MG tablet Take 1 tablet (100 mg total) by mouth every 12 (twelve) hours. 14 tablet 0   empagliflozin (JARDIANCE) 10 MG TABS tablet Take 10 mg by mouth daily.     ezetimibe (ZETIA) 10 MG tablet Take 1 tablet (10 mg total) by mouth daily. 90 tablet 3   folic acid (FOLVITE) 1 MG tablet Take 1 tablet (1 mg total) by mouth daily. 90 tablet 0   furosemide (LASIX) 20 MG tablet Take 1 tablet (20 mg total) by mouth 2  (two) times a week. 4520 tablet 3   gabapentin (NEURONTIN) 300 MG capsule 3 tablets in the morning and evening ,  2 tablets at night 240 capsule 5   glucose blood (ACCU-CHEK AVIVA PLUS) test strip Use to check blood sugars up to 4 times daily. ICD-10 E11.40 400 each 0   isosorbide mononitrate (IMDUR) 30 MG 24 hr tablet Take 1 tablet (30 mg total) by mouth daily. 90 tablet 3   Lancets (ACCU-CHEK MULTICLIX) lancets Use to to check blood sugars up to 4 times daily. ICD-10: E11.40 400 each 0   levothyroxine (SYNTHROID) 88 MCG tablet Take 1 tablet (88 mcg total) by mouth daily before breakfast. 90 tablet 1   losartan (COZAAR) 100 MG tablet Take 1 tablet by mouth once daily 90 tablet 3   metFORMIN (GLUCOPHAGE) 500 MG tablet TAKE 1 TABLET BY MOUTH TWICE DAILY WITH A MEAL 180 tablet 1   methocarbamol (ROBAXIN) 500 MG tablet Take 1 tablet (500 mg total) by mouth every 8 (eight) hours as needed. 90 tablet 2   Multiple Vitamin (MULTIVITAMIN WITH MINERALS) TABS tablet Take 1 tablet by mouth daily.     naloxone (NARCAN) nasal spray 4 mg/0.1 mL Use as needed for excessive sedation /opioid overdose 1 each 1   omeprazole (PRILOSEC) 40 MG capsule TAKE 1 CAPSULE BY MOUTH IN THE MORNING AND 1 CAPSULE IN THE EVENING 180 capsule 3   oxyCODONE (OXY IR/ROXICODONE) 5 MG immediate release tablet Take 1 tablet (5 mg total) by mouth every 6 (six) hours as needed for moderate pain (pain score 4-6). 24 tablet 0   promethazine (PHENERGAN) 12.5 MG tablet Take 1 tablet (12.5 mg total) by mouth every 4 (four) hours as needed for nausea or vomiting. (Patient taking differently: Take 12.5 mg by mouth every 8 (eight) hours as needed for nausea or vomiting.) 20 tablet 0   traZODone (DESYREL) 100 MG tablet TAKE 1 & 1/2 (ONE & ONE-HALF) TABLETS BY MOUTH AT BEDTIME 135 tablet 2   No facility-administered medications prior to visit.    Review of Systems;  Patient denies headache, fevers, malaise, unintentional weight loss, skin rash,  eye pain, sinus congestion and sinus pain, sore throat, dysphagia,  hemoptysis , cough, dyspnea, wheezing, chest pain, palpitations, orthopnea, edema, abdominal pain, nausea, melena, diarrhea, constipation, flank pain, dysuria, hematuria, urinary  Frequency, nocturia, numbness, tingling, seizures,  Focal weakness, Loss of consciousness,  Tremor, insomnia, depression, anxiety, and suicidal ideation.      Objective:  BP (!) 190/92   Pulse 88   Temp 98 F (36.7 C) (Oral)   Ht  6' (1.829 m)   Wt 225 lb 9.6 oz (102.3 kg)   SpO2 96%   BMI 30.60 kg/m   BP Readings from Last 3 Encounters:  09/13/22 (!) 190/92  09/11/22 (!) 162/99  08/16/22 (!) 163/75    Wt Readings from Last 3 Encounters:  09/13/22 225 lb 9.6 oz (102.3 kg)  09/11/22 231 lb (104.8 kg)  08/10/22 230 lb (104.3 kg)    Physical Exam  Lab Results  Component Value Date   HGBA1C 7.1 (H) 07/26/2022   HGBA1C 6.4 04/19/2022   HGBA1C 6.8 (H) 01/16/2022    Lab Results  Component Value Date   CREATININE 1.02 09/11/2022   CREATININE 0.89 08/16/2022   CREATININE 0.92 08/14/2022    Lab Results  Component Value Date   WBC 6.4 09/11/2022   HGB 13.6 09/11/2022   HCT 41.3 09/11/2022   PLT 268 09/11/2022   GLUCOSE 101 (H) 09/11/2022   CHOL 185 07/26/2022   TRIG 368.0 (H) 07/26/2022   HDL 33.70 (L) 07/26/2022   LDLDIRECT 129.0 07/26/2022   LDLCALC 55 04/19/2022   ALT 12 08/14/2022   AST 18 08/14/2022   NA 137 09/11/2022   K 3.9 09/11/2022   CL 100 09/11/2022   CREATININE 1.02 09/11/2022   BUN 14 09/11/2022   CO2 28 09/11/2022   TSH 2.16 07/26/2022   PSA 0.70 10/16/2017   INR 0.98 08/27/2015   HGBA1C 7.1 (H) 07/26/2022   MICROALBUR <0.7 04/19/2022    CT Head Wo Contrast  Result Date: 09/11/2022 CLINICAL DATA:  Headaches EXAM: CT HEAD WITHOUT CONTRAST TECHNIQUE: Contiguous axial images were obtained from the base of the skull through the vertex without intravenous contrast. RADIATION DOSE REDUCTION: This exam  was performed according to the departmental dose-optimization program which includes automated exposure control, adjustment of the mA and/or kV according to patient size and/or use of iterative reconstruction technique. COMPARISON:  09/24/2017 FINDINGS: Brain: No acute intracranial findings are seen. There are no signs of bleeding within the cranium. Ventricles are unremarkable. Cortical sulci are prominent. Vascular: Unremarkable. Skull: No acute findings are seen. Sinuses/Orbits: Unremarkable. Other: None. IMPRESSION: No acute intracranial findings are seen in noncontrast CT brain. Electronically Signed   By: Ernie Avena M.D.   On: 09/11/2022 11:27   DG Chest 2 View  Result Date: 09/11/2022 CLINICAL DATA:  57 year old male with hypertension. EXAM: CHEST - 2 VIEW COMPARISON:  Chest radiographs 08/12/2022 and earlier. FINDINGS: PA and lateral views at 1046 hours. Right shoulder arthroplasty redemonstrated, new from last year. Lung volumes and mediastinal contours are normal. Visualized tracheal air column is within normal limits. Both lungs appear clear. No pneumothorax or pleural effusion. No acute osseous abnormality identified. Negative visible bowel gas. IMPRESSION: Negative, no cardiopulmonary abnormality. Electronically Signed   By: Odessa Fleming M.D.   On: 09/11/2022 11:02    Assessment & Plan:  .Essential hypertension Assessment & Plan: On prior visits he had Improved control on carvedilol 12.5 mg bid , imdur, losartan .  Current elevations are due in part to medication noncompliance adding amlodipine 5 mg daily ,  will increase to 10 mg if BP is not < 130/80 in  2 weeks    Lab Results  Component Value Date   CREATININE 1.02 09/11/2022   Lab Results  Component Value Date   NA 137 09/11/2022   K 3.9 09/11/2022   CL 100 09/11/2022   CO2 28 09/11/2022      Diabetes mellitus treated with oral medication (HCC) Assessment &  Plan: Managed with Jardiance and  metformin  . Taking   gabapentin at the maxImal dose (900 qam and q pm  600 mg qhs) A1c and lipids are at goal.    Lab Results  Component Value Date   HGBA1C 7.1 (H) 07/26/2022   Lab Results  Component Value Date   CHOL 185 07/26/2022   HDL 33.70 (L) 07/26/2022   LDLCALC 55 04/19/2022   LDLDIRECT 129.0 07/26/2022   TRIG 368.0 (H) 07/26/2022   CHOLHDL 5 07/26/2022       MDD (major depressive disorder), recurrent episode, moderate (HCC) Assessment & Plan: Complicated by increased caregiver burden caring ofr mother who has dementia .Managed by  psychiatrist Dr Lolly Mustache    Anxiety state Assessment & Plan: Aggravated by his mother's current state /diagnosis of dementia and increased responsibilities i His anxiety is managed with scheduled clonazepam, buspirone And trazodone 100 mg qhs.  Will encourage him to consider actively trying to reduce his dependance on  Clonazepam.  [facility]oxone prescription sent to [facility] given concurrent use of hydrocodone    Chronic pain associated with significant psychosocial dysfunction Assessment & Plan: He requires management of pain involving both knees,   Cervical spine and shoulders with hydrocodone, which was  last filled on Feb 3.  A refill for April is on file at his pharmacy  he has been prescribed oxycodone IR 5 mg  post operatively following his reverse shoulder replacement April 16.  Last rx from orthopedics was May 3 for #20.    Refill history confirmed via [place] Controlled Substance database, accessed by me today..   Other orders -     amLODIPine Besylate; Take 1 tablet (5 mg total) by mouth daily.  Dispense: 90 tablet; Refill: 1 -     hydrALAZINE HCl; Take 1 tablet (25 mg total) by mouth 3 (three) times daily. (Patient taking differently: Take 25 mg by mouth 3 (three) times daily. FOR ELEVATIONS   IN BETWEEN SCHEDULED MEDS   the dose of  25 MG CAN BE TAKEN  for readings > 150/90)  Dispense: 90 tablet; Refill: 2     I provided 30 minutes of face-to-face time  during this encounter reviewing patient's last visit with me, patient's  most recent visit with Orthopedics,  recent ER visit ,  recent surgical and non surgical procedures, previous  labs and imaging studies, counseling on currently addressed issues,  and post visit ordering to diagnostics and therapeutics .   Follow-up: No follow-ups on file.   [name], MD

## 2022-09-14 ENCOUNTER — Ambulatory Visit: Payer: Medicare HMO

## 2022-09-14 ENCOUNTER — Telehealth: Payer: Self-pay

## 2022-09-14 NOTE — Transitions of Care (Post Inpatient/ED Visit) (Signed)
09/14/2022  Name: [name] MRN: [id number] DOB: 1965-06-03  Today's TOC FU Call Status: Today's TOC FU Call Status:: Successful TOC FU Call Competed TOC FU Call Complete Date: 09/14/22  Red on EMMI-ED Discharge Alert Date & Reason:09/13/22 "Have d/c instructions? No"  Transition Care Management Follow-up Telephone Call Date of Discharge: 09/11/22 Discharge Facility: [facility]) Type of Discharge: Emergency Department Reason for ED Visit: Other: ("HTN,unspecified type") How have you been since you were released from the hospital?: Better (Pt states he is taking BP about 3-4x/day. Last BP reading was 171/86. He reports he saw PCP yest and had BP meds adjusted.) Any questions or concerns?: No  Items Reviewed: Did you receive and understand the discharge instructions provided?: Yes Medications obtained,verified, and reconciled?: No Medications Not Reviewed Reasons:: Other: (partial med review done-reviewed BP mgmt meds with pt-he confirms he is taking as ordered and has no questions/concerns about meds) Any new allergies since your discharge?: No Dietary orders reviewed?: Yes Type of Diet Ordered:: low salt/heart healthy Do you have support at home?: Yes  Medications Reviewed Today: Medications Reviewed Today     Reviewed by Charlyn Minerva, RN (Registered Nurse) on 09/14/22 at 1218  Med List Status: <None>   Medication Order Taking? Sig Documenting Provider Last Dose Status Informant  amLODipine (NORVASC) 5 MG tablet [id number] Yes Take 1 tablet (5 mg total) by mouth daily. [name], MD Taking Active   ARIPiprazole (ABILIFY) 5 MG tablet [id number]  Take one tab at bed time Cleotis Nipper, MD  Active Self  aspirin EC 81 MG tablet [id number]  Take 1 tablet (81 mg total) by mouth daily at 6 PM. Swallow whole. Cammy Copa, MD  Active   atorvastatin (LIPITOR) 80 MG tablet [id number]  Take 1 tablet (80 mg total) by mouth daily.  [name], MD  Active Self  Blood Glucose Monitoring Suppl (ACCU-CHEK AVIVA PLUS) w/Device KIT [id number]  Use to check blood sugars up to 4 times daily. ICD-10 E11.40 [name], MD  Active Self           Med Note Littie Deeds, CHERYL A   Wed May 03, 2022  9:32 AM) Patient states testing twice daily  buPROPion (WELLBUTRIN XL) 300 MG 24 hr tablet [id number]  Take 1 tablet (300 mg total) by mouth daily. Cleotis Nipper, MD  Active Self  busPIRone (BUSPAR) 15 MG tablet [id number]  Take 1 tablet (15 mg total) by mouth 3 (three) times daily. [name], MD  Active Self  carvedilol (COREG) 12.5 MG tablet [id number] Yes Take 1 tablet (12.5 mg total) by mouth 2 (two) times daily. Antonieta Iba, MD Taking Active Self  celecoxib (CELEBREX) 100 MG capsule [id number]  Take 1 capsule by mouth twice daily Magnant, Joycie Peek, PA-C  Active   clonazePAM (KLONOPIN) 2 MG tablet [id number]  TAKE 1/2 (ONE-HALF) TABLET BY MOUTH TWICE DAILY AS NEEDED FOR ANXIETY [name], MD  Active Self  dicyclomine (BENTYL) 20 MG tablet [id number]  TAKE 1 TABLET BY MOUTH EVERY 6 HOURS [name], MD  Active   docusate sodium (COLACE) 100 MG capsule [id number]  Take 1 capsule (100 mg total) by mouth 2 (two) times daily. Cammy Copa, MD  Active   doxycycline (VIBRA-TABS) 100 MG tablet [id number]  Take 1 tablet (100 mg total) by mouth every 12 (twelve) hours. Cammy Copa, MD  Active   empagliflozin (JARDIANCE) 10 MG TABS tablet  [id number]  Take 10 mg by mouth daily. [name], MD  Active Self           Med Note Littie Deeds, CHERYL A   Wed Aug 02, 2022  3:42 PM) Receive as part of PAP  ezetimibe (ZETIA) 10 MG tablet [id number]  Take 1 tablet (10 mg total) by mouth daily. [name], MD  Active Self  folic acid (FOLVITE) 1 MG tablet [id number]  Take 1 tablet (1 mg total) by mouth daily. Cammy Copa, MD  Active   furosemide (LASIX) 20 MG tablet [id number]  Take 1 tablet (20 mg total) by mouth 2  (two) times a week. [name], MD  Active Self           Med Note Sherrie Mustache, [place] A   Fri Jul 28, 2022  3:40 PM) Tuesdays and Fridays  gabapentin (NEURONTIN) 300 MG capsule [id number]  3 tablets in the morning and evening ,  2 tablets at night [name], MD  Active Self  glucose blood (ACCU-CHEK AVIVA PLUS) test strip [id number]  Use to check blood sugars up to 4 times daily. ICD-10 E11.40 [name], MD  Active Self           Med Note Littie Deeds, CHERYL A   Wed May 03, 2022  9:31 AM) Checking twice daily  hydrALAZINE (APRESOLINE) 25 MG tablet [id number]  Take 1 tablet (25 mg total) by mouth 3 (three) times daily.  Patient taking differently: Take 25 mg by mouth 3 (three) times daily. FOR ELEVATIONS   IN BETWEEN SCHEDULED MEDS   the dose of  25 MG CAN BE TAKEN  for readings > 150/90   [name], MD  Active Self  isosorbide mononitrate (IMDUR) 30 MG 24 hr tablet [id number] Yes Take 1 tablet (30 mg total) by mouth daily. Antonieta Iba, MD Taking Active Self  Lancets (ACCU-CHEK MULTICLIX) lancets [id number]  Use to to check blood sugars up to 4 times daily. ICD-10: E11.40 [name], MD  Active Self  levothyroxine (SYNTHROID) 88 MCG tablet [id number]  Take 1 tablet (88 mcg total) by mouth daily before breakfast. [name], MD  Active Self  losartan (COZAAR) 100 MG tablet [id number] Yes Take 1 tablet by mouth once daily [name], MD Taking Active Self  metFORMIN (GLUCOPHAGE) 500 MG tablet [id number]  TAKE 1 TABLET BY MOUTH TWICE DAILY WITH A MEAL [name], MD  Active Self  methocarbamol (ROBAXIN) 500 MG tablet [id number]  Take 1 tablet (500 mg total) by mouth every 8 (eight) hours as needed. [name], MD  Active Self  Multiple Vitamin (MULTIVITAMIN WITH MINERALS) TABS tablet [id number]  Take 1 tablet by mouth daily. [provider]  Active Self  naloxone [facility]) nasal spray 4 mg/0.1 mL [id number]  Use as needed for excessive sedation /opioid overdose  [name], MD  Active Self           Med Note Sherrie Mustache, [place] A   Fri Jul 28, 2022  3:43 PM)    omeprazole (PRILOSEC) 40 MG capsule [id number]  TAKE 1 CAPSULE BY MOUTH IN THE MORNING AND 1 CAPSULE IN THE EVENING [name], MD  Active Self  oxyCODONE (OXY IR/ROXICODONE) 5 MG immediate release tablet [id number]  Take 1 tablet (5 mg total) by mouth every 6 (six) hours as needed for moderate pain (pain score 4-6). Magnant, Joycie Peek, PA-C  Active   promethazine (PHENERGAN) 12.5  MG tablet [id number]  Take 1 tablet (12.5 mg total) by mouth every 4 (four) hours as needed for nausea or vomiting.  Patient taking differently: Take 12.5 mg by mouth every 8 (eight) hours as needed for nausea or vomiting.   [name], MD  Active Self  traZODone (DESYREL) 100 MG tablet [id number]  TAKE 1 & 1/2 (ONE & ONE-HALF) TABLETS BY MOUTH AT BEDTIME [name], MD  Active Self            Home Care and Equipment/Supplies: Were Home Health Services Ordered?: NA Any new equipment or medical supplies ordered?: NA  Functional Questionnaire: Do you need assistance with bathing/showering or dressing?: No Do you need assistance with meal preparation?: No Do you need assistance with eating?: No Do you have difficulty maintaining continence: No Do you need assistance with getting out of bed/getting out of a chair/moving?: No Do you have difficulty managing or taking your medications?: No  Follow up appointments reviewed: PCP Follow-up appointment confirmed?: Yes Date of PCP follow-up appointment?: 09/13/22 Follow-up Provider: Dr. Darrick Huntsman Specialist [facility] Follow-up appointment confirmed?: NA Do you need transportation to your follow-up appointment?: No Do you understand care options if your condition(s) worsen?: Yes-patient verbalized understanding  SDOH Interventions Today    Flowsheet Row Most Recent Value  SDOH Interventions   Food Insecurity Interventions Intervention Not Indicated   Transportation Interventions Intervention Not Indicated       TOC Interventions Today    Flowsheet Row Most Recent Value  TOC Interventions   TOC Interventions Discussed/Reviewed TOC Interventions Discussed      Interventions Today    Flowsheet Row Most Recent Value  Chronic Disease   Chronic disease during today's visit Hypertension (HTN)  General Interventions   General Interventions Discussed/Reviewed General Interventions Discussed, Doctor Visits, Durable Medical Equipment (DME)  Doctor Visits Discussed/Reviewed Doctor Visits Discussed, PCP  Durable Medical Equipment (DME) BP Cuff  PCP/Specialist Visits Compliance with follow-up visit  Education Interventions   Education Provided Provided Education  Provided Verbal Education On Nutrition, When to see the doctor, Medication  Nutrition Interventions   Nutrition Discussed/Reviewed Nutrition Discussed, Adding fruits and vegetables, Decreasing salt  Pharmacy Interventions   Pharmacy Dicussed/Reviewed Pharmacy Topics Discussed, Medications and their functions       Alessandra Grout [facility] Care Management Care Management Community Coordinator Direct Phone: [phone] Toll Free: [phone] Fax: [phone]

## 2022-09-15 ENCOUNTER — Encounter: Payer: Self-pay | Admitting: Internal Medicine

## 2022-09-15 NOTE — Assessment & Plan Note (Signed)
He requires management of pain involving both knees,   Cervical spine and shoulders with hydrocodone, which was  last filled on Feb 3.  A refill for April is on file at his pharmacy  he has been prescribed oxycodone IR 5 mg  post operatively following his reverse shoulder replacement April 16.  Last rx from orthopedics was May 3 for #20.    Refill history confirmed via [place] Controlled Substance database, accessed by me today.Marland Kitchen

## 2022-09-15 NOTE — Assessment & Plan Note (Signed)
Complicated by increased caregiver burden caring ofr mother who has dementia .Managed by  psychiatrist Dr Lolly Mustache

## 2022-09-15 NOTE — Assessment & Plan Note (Signed)
Aggravated by his mother's current state /diagnosis of dementia and increased responsibilities i His anxiety is managed with scheduled clonazepam, buspirone And trazodone 100 mg qhs.  Will encourage him to consider actively trying to reduce his dependance on  Clonazepam.  Naloxone prescription sent to [facility] given concurrent use of hydrocodone

## 2022-09-15 NOTE — Assessment & Plan Note (Signed)
Managed with Jardiance and  metformin  . Taking  gabapentin at the maxImal dose (900 qam and q pm  600 mg qhs) A1c and lipids are at goal.    Lab Results  Component Value Date   HGBA1C 7.1 (H) 07/26/2022   Lab Results  Component Value Date   CHOL 185 07/26/2022   HDL 33.70 (L) 07/26/2022   LDLCALC 55 04/19/2022   LDLDIRECT 129.0 07/26/2022   TRIG 368.0 (H) 07/26/2022   CHOLHDL 5 07/26/2022

## 2022-09-19 ENCOUNTER — Ambulatory Visit: Payer: Medicare HMO

## 2022-09-19 DIAGNOSIS — M25511 Pain in right shoulder: Secondary | ICD-10-CM

## 2022-09-19 DIAGNOSIS — M25611 Stiffness of right shoulder, not elsewhere classified: Secondary | ICD-10-CM | POA: Diagnosis not present

## 2022-09-19 DIAGNOSIS — M19011 Primary osteoarthritis, right shoulder: Secondary | ICD-10-CM | POA: Diagnosis not present

## 2022-09-19 DIAGNOSIS — Z96611 Presence of right artificial shoulder joint: Secondary | ICD-10-CM | POA: Diagnosis not present

## 2022-09-19 NOTE — Therapy (Signed)
OUTPATIENT PHYSICAL THERAPY TREATMENT NOTE And Progress Report   Patient Name: [name] MRN: [id number] DOB:[birth year], 57 y.o., male Today's Date: 09/19/2022  PCP: [name], MD  REFERRING PROVIDER: Cammy Copa, MD   END OF SESSION:  PT End of Session - 09/19/22 1512     Visit Number 5    Number of Visits 25    Date for PT Re-Evaluation 11/24/22    PT Start Time 1512    PT Stop Time 1558    PT Time Calculation (min) 46 min    Activity Tolerance Patient tolerated treatment well    Behavior During Therapy [facility] for tasks assessed/performed               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   Anxiety    Anxiety and depression    Arthritis    knees and shoulders   Benzodiazepine withdrawal with complication (HCC) 01/09/2019   Bruit    L   Chest pain    hx   Cognitive complaints with normal neuropsychological exam 10/23/2016   Diabetes mellitus without complication (HCC)    "borderline", diet controlled, no meds, patient has lost 30 lbs   Edema    Fatty liver    GERD (gastroesophageal reflux disease)    uses Omeprazole   Goiter    HLD (hyperlipidemia)    Hypertension    essential, benign   Hypothyroidism    Impotence of organic origin    Murmur    never has caused any problems   Neuromuscular disorder (HCC)    neuropathy bilateral feet   Other chest pain    tightness, pressure   Palpitation    hx   Precordial pain    Sleep apnea    uses cpap   Past Surgical History:  Procedure Laterality Date   APPENDECTOMY  2002   done at [facility]   CARDIAC CATHETERIZATION     CARDIAC CATHETERIZATION N/A 08/27/2015   Procedure: Left Heart Cath and Coronary Angiography;  Surgeon: Dolores Patty, MD;  Location: [facility] INVASIVE CV LAB;  Service: Cardiovascular;  Laterality: N/A;   COLONOSCOPY  in his  20's   COLONOSCOPY  2019   EAR CYST EXCISION Left 07/08/2019   Procedure: open excision baker's cyst left knee;  Surgeon: Cammy Copa, MD;  Location: [facility] OR;  Service: Orthopedics;  Laterality: Left;   HERNIA REPAIR  05/15/2013   umbilical   JOINT REPLACEMENT     right knee   KNEE ARTHROSCOPY Left 12/17/2018   Procedure: left knee arthroscopy, meniscal debridement, loose body removal;  Surgeon: Cammy Copa, MD;  Location: [facility] OR;  Service: Orthopedics;  Laterality: Left;   REVERSE SHOULDER ARTHROPLASTY Right 08/10/2022   Procedure: RIGHT REVERSE SHOULDER ARTHROPLASTY, BICEPS TENODESIS;  Surgeon: Cammy Copa, MD;  Location: [facility] OR;  Service: Orthopedics;  Laterality: Right;   REVISION TOTAL KNEE ARTHROPLASTY Right 06/23/2014   DR [name]   SHOULDER ARTHROSCOPY WITH ROTATOR CUFF REPAIR AND SUBACROMIAL DECOMPRESSION Right 03/07/2022   Procedure: RIGHT SHOULDER ARTHROSCOPY, DEBRIDEMENT, MINI OPEN ROTATOR CUFF TEAR REPAIR;  Surgeon: Cammy Copa, MD;  Location: [facility] OR;  Service: Orthopedics;  Laterality: Right;   TONSILLECTOMY     TOTAL KNEE ARTHROPLASTY Right 2011   right   TOTAL KNEE ARTHROPLASTY Left 02/10/2021   Procedure: LEFT TOTAL KNEE ARTHROPLASTY;  Surgeon: Cammy Copa, MD;  Location: [facility] OR;  Service: Orthopedics;  Laterality: Left;   TOTAL KNEE REVISION Right 06/23/2014   Procedure: TOTAL KNEE REVISION;  Surgeon: Cammy Copa, MD;  Location: [facility] OR;  Service: Orthopedics;  Laterality: Right;   UPPER GASTROINTESTINAL ENDOSCOPY  04/30/2013   Demetrius Charity TOOTH EXTRACTION     Patient Active Problem List   Diagnosis Date Noted   Arthritis of right shoulder region 08/20/2022   Retained orthopedic hardware 08/20/2022   S/P reverse total shoulder arthroplasty, right 08/10/2022   Pernicious anemia 10/12/2021   Chronic pain associated with significant psychosocial dysfunction 03/05/2021   History of deep venous thrombosis (DVT) of distal vein  of left lower extremity 03/05/2021   Arthritis of left knee    S/P TKR (total knee replacement), left 02/10/2021   Grief counseling 11/14/2020   Preoperative evaluation to rule out surgical contraindication 09/02/2020   MDD (major depressive disorder), recurrent episode, moderate (HCC) 05/29/2020   Colon cancer screening 02/02/2020   Eczema of both external ears 12/08/2019   1+ pitting edema 12/08/2019   Polyarthritis of multiple sites 10/27/2019   CAD (coronary artery disease) 10/27/2019   Radiculopathy, cervical region 06/10/2019   Carpal tunnel syndrome, left upper limb 06/10/2019   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   Spinal stenosis of cervical region 12/31/2018   Loose body of left knee    Plantar fasciitis of left foot 08/24/2017   Chronic pain of both hips 06/20/2017   Nontraumatic complete tear of right rotator cuff 09/14/2015   Hypothyroidism due to acquired atrophy of thyroid 09/14/2015   Arthritis of knee 06/23/2014   Encounter for preventive health examination 06/17/2014   Enlarged RV (right ventricle) 06/17/2014   GERD (gastroesophageal reflux disease) 04/20/2014   Cervical spine degeneration 02/17/2014   Overweight 10/29/2013   Chronic knee pain 10/29/2013   Diabetes mellitus treated with oral medication (HCC) 10/23/2012   Seborrheic dermatitis, unspecified 05/17/2012   Fatty liver 04/22/2012   Hypogonadism male 03/12/2012   Chronic pain of multiple joints 12/24/2010   Sciatica of left side associated with disorder of lumbar spine 12/24/2010   Hyperlipidemia associated with type 2 diabetes mellitus (HCC) 01/13/2009   Anxiety state 01/13/2009   Essential hypertension 01/13/2009   OSA (obstructive sleep apnea) 01/13/2009    REFERRING DIAG: M19.011 (ICD-10-CM) - Arthritis of right shoulder region Z96.611 (ICD-10-CM) - S/P reverse total shoulder arthroplasty, right   THERAPY DIAG:  Right shoulder pain, unspecified chronicity  Stiffness of right shoulder, not  elsewhere classified  Rationale for Evaluation and Treatment Rehabilitation  PERTINENT HISTORY: S/P R reverse TSA on 08/10/2022. Has not yet had PT since then. Pt is R hand dominant. Was given pedulums to do at home following his procedure. Pt fell off a ladder 3 years ago. Underwent a RTC repair which did not hold, resulting in his current shoulder replacement surgery.   PRECAUTIONS: Reverse TSA precautions   SUBJECTIVE:   SUBJECTIVE STATEMENT: R shoulder is about a 4-5/10 currently when raising his arm up.  MD added amlodipine. Has a follow up appointment with surgeon tomorrow.     PAIN:  Are you having pain? See subjective   TODAY'S TREATMENT:  DATE: 09/19/2022  R reverse TSA on 08/10/2022     Per surgeon note on 08/25/2022 "Plan is discontinue sling. Okay to very lightly lift with the operative extremity but no lifting anything heavier than a coffee cup or cell phone. Start physical therapy to focus on passive range of motion and active range of motion with deltoid isometrics. Do not want to externally rotate past 30 degrees to protect subscapularis repair. Follow-up in 4 weeks for clinical recheck. "     No latex allergies Blood pressure overall controlled by medication.    Surgical incision anterior shoulder/deltoid area, with steri strips, healing satisfactorily.      Therapeutic exercise  Pt currently on his 5th - 6th week post op  Blood pressure L arm sitting, mechanically taken, normal cuff: 165/85, HR 90  Supine R shoulder A/AROM  Flexion 1x    Abduction 1x  Supine R shoulder ER with arm at side A/AROM 1x  Reviewed progress/current status with PT towards goals.    Supine R shoulder PROM             With L UE performing movement                          Flexion 10x               With PT    Scaption 10x3                           Abduction 10x3  Seated scapular retraction 10x3 with 5 second holds    Standing at wall submax (less than 50% effort) deltoid isometrics in neutral   Flexion 10x5 seconds for 2 sets  Abduction 10x5 seconds    Mod cues for pain free level of effort.      Seated submax (less than 50% effort) deltoid isometrics in neutral     Scaption 10x5 seconds for 2 sets   Extension 10x5 seconds for 1 set      Improved exercise technique, movement at target joints, use of target muscles after mod verbal, visual, tactile cues.      Response to treatment Fair tolerance to today's session.        Clinical impression Pt currently on his 5th to 6th  week post op. Pt missed about 1 week of PT secondary to elevated blood pressure levels. Pt currently demonstrates 139 degrees supine R shoulder flexion AAROM, and 108 degrees supine R shoulder abduction AAROM.  MMT for shoulder strength not yet performed to allow for more healing. Currently working on ROM and deltoid isometrics per MD instructions. Pt making very good progress with ROM. Pt will benefit from continued skilled physical therapy services to improve ROM, strength and function.        PATIENT EDUCATION: Education details: there-ex, HEP Person educated: Patient Education method: Explanation, Demonstration, Tactile cues, Verbal cues, and Handouts Education comprehension: verbalized understanding and returned demonstration  HOME EXERCISE PROGRAM: Access Code: [email or web address] URL: [email or web address] Date: 08/30/2022 Prepared by: Loralyn Freshwater   Exercises - Supine Shoulder Flexion PROM  - 3 x daily - 7 x weekly - 3 sets - 10 reps       PT Short Term Goals - 09/19/22 1559       PT SHORT TERM GOAL #1   Title Pt will be independent with his initial HEP to improve R shoulder ROM, strength and function.    Baseline Pt  has started his initial HEP (08/30/2022); Pt performing his HEP independently (09/19/2022)    Time 3     Period Weeks    Status Achieved    Target Date 09/22/22              PT Long Term Goals - 09/19/22 1520       PT LONG TERM GOAL #1   Title Pt will imrove R shoulder flexion and abduction AROM to 140 degrees or more to promote ability to reach with less difficulty.    Baseline R shoulder PROM 120 degrees flexion, 90 degrees abduction (08/30/2022); supine R shoulder AAROM flexion 139 degrees,  abduction 108 degrees (09/19/2022)    Time 12    Period Weeks    Status Partially Met    Target Date 11/24/22      PT LONG TERM GOAL #2   Title Pt will improve R shoulder ER AROM to at least 70 degrees to promote ability to reach behind his head.    Baseline R shoulder PROM ER with arm at side 30 degrees (08/30/2022); able to reach 30 degrees with arm at side (09/19/2022)    Time 12    Period Weeks    Status On-going    Target Date 11/24/22      PT LONG TERM GOAL #3   Title Pt will have at least 4/5 R shoulder flexion, abduction, ER and IR strength to promote ability to use his R UE for functional tasks.    Baseline R shoulder strength not yet tested to allow for more healing. (08/30/2022), (09/19/2022)    Time 12    Period Weeks    Status On-going    Target Date 11/24/22      PT LONG TERM GOAL #4   Title Pt will improve his R shoulder FOTO score by at least 10 points as a demonstration of improved function.    Baseline R shoulder FOTO 39 (08/30/2022)    Time 12    Period Weeks    Status On-going    Target Date 11/24/22              Plan - 09/19/22 1510     Clinical Impression Statement Pt currently on his 5th to 6th  week post op. Pt missed about 1 week of PT secondary to elevated blood pressure levels. Pt currently demonstrates 139 degrees supine R shoulder flexion AAROM, and 108 degrees supine R shoulder abduction AAROM.  MMT for shoulder strength not yet performed to allow for more healing. Currently working on ROM and deltoid isometrics per MD instructions. Pt making very good  progress with ROM. Pt will benefit from continued skilled physical therapy services to improve ROM, strength and function.    Personal Factors and Comorbidities Comorbidity 3+;Past/Current Experience;Time since onset of injury/illness/exacerbation;Fitness    Comorbidities Anxiety, depression, arthritis, DM, HTN, sleep apnea    Examination-Activity Limitations Bathing;Dressing;Bed Mobility;Hygiene/Grooming;Lift;Caring for Others;Carry;Reach Overhead;Toileting    Stability/Clinical Decision Making Stable/Uncomplicated    Rehab Potential Fair    PT Frequency 2x / week    PT Duration 12 weeks    PT Treatment/Interventions Therapeutic exercise;Therapeutic activities;Neuromuscular re-education;Patient/family education;Manual techniques;Electrical Stimulation;Passive range of motion    PT Next Visit Plan PROM, A/AROM, manual techniques, modalities PRN    PT Home Exercise Plan Medbridge Access Code: [email or web address]    Consulted and Agree with Plan of Care Patient             Thank you for your referral.   Irving Shows  Dutch Ing PT, DPT  09/19/2022, 4:10 PM

## 2022-09-20 ENCOUNTER — Encounter: Payer: Self-pay | Admitting: Orthopedic Surgery

## 2022-09-20 ENCOUNTER — Ambulatory Visit (INDEPENDENT_AMBULATORY_CARE_PROVIDER_SITE_OTHER): Payer: Medicare HMO | Admitting: Orthopedic Surgery

## 2022-09-20 DIAGNOSIS — M19011 Primary osteoarthritis, right shoulder: Secondary | ICD-10-CM

## 2022-09-20 NOTE — Progress Notes (Signed)
Post-Op Visit Note   Patient: [name]           Date of Birth: 11-Jan-1966           MRN: [id number] Visit Date: 09/20/2022 PCP: [name], MD   Assessment & Plan:  Chief Complaint:  Chief Complaint  Patient presents with   Right Shoulder - Routine Post Op    R RSA (surgery date 08-10-22)   Visit Diagnoses:  1. Arthritis of right shoulder region     Plan: [name] is a 57 year old patient who underwent right reverse shoulder replacement 1424.  Pain is a little bit worse after physical therapy.  Only really takes 1 oxycodone after therapy.  Doing home exercise program as well.  On examination the incision is intact.  Deltoid fires.  Forward flexion and abduction both above 90 degrees.  Okay to lift 5 pounds for now.  6-week return for final clinical recheck and release.  Follow-Up Instructions: No follow-ups on file.   Orders:  No orders of the defined types were placed in this encounter.  No orders of the defined types were placed in this encounter.   Imaging: No results found.  PMFS History: Patient Active Problem List   Diagnosis Date Noted   Arthritis of right shoulder region 08/20/2022   Retained orthopedic hardware 08/20/2022   S/P reverse total shoulder arthroplasty, right 08/10/2022   Pernicious anemia 10/12/2021   Chronic pain associated with significant psychosocial dysfunction 03/05/2021   History of deep venous thrombosis (DVT) of distal vein of left lower extremity 03/05/2021   Arthritis of left knee    S/P TKR (total knee replacement), left 02/10/2021   Grief counseling 11/14/2020   Preoperative evaluation to rule out surgical contraindication 09/02/2020   MDD (major depressive disorder), recurrent episode, moderate (HCC) 05/29/2020   Colon cancer screening 02/02/2020   Eczema of both external ears 12/08/2019   1+ pitting edema 12/08/2019   Polyarthritis of multiple sites 10/27/2019   CAD (coronary artery disease) 10/27/2019    Radiculopathy, cervical region 06/10/2019   Carpal tunnel syndrome, left upper limb 06/10/2019   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   Spinal stenosis of cervical region 12/31/2018   Loose body of left knee    Plantar fasciitis of left foot 08/24/2017   Chronic pain of both hips 06/20/2017   Nontraumatic complete tear of right rotator cuff 09/14/2015   Hypothyroidism due to acquired atrophy of thyroid 09/14/2015   Arthritis of knee 06/23/2014   Encounter for preventive health examination 06/17/2014   Enlarged RV (right ventricle) 06/17/2014   GERD (gastroesophageal reflux disease) 04/20/2014   Cervical spine degeneration 02/17/2014   Overweight 10/29/2013   Chronic knee pain 10/29/2013   Diabetes mellitus treated with oral medication (HCC) 10/23/2012   Seborrheic dermatitis, unspecified 05/17/2012   Fatty liver 04/22/2012   Hypogonadism male 03/12/2012   Chronic pain of multiple joints 12/24/2010   Sciatica of left side associated with disorder of lumbar spine 12/24/2010   Hyperlipidemia associated with type 2 diabetes mellitus (HCC) 01/13/2009   Anxiety state 01/13/2009   Essential hypertension 01/13/2009   OSA (obstructive sleep apnea) 01/13/2009   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   Anxiety    Anxiety and depression    Arthritis    knees and shoulders   Benzodiazepine withdrawal with complication (HCC) 01/09/2019   Bruit  L   Chest pain    hx   Cognitive complaints with normal neuropsychological exam 10/23/2016   Diabetes mellitus without complication (HCC)    "borderline", diet controlled, no meds, patient has lost 30 lbs   Edema    Fatty liver    GERD (gastroesophageal reflux disease)    uses Omeprazole   Goiter    HLD (hyperlipidemia)    Hypertension    essential, benign   Hypothyroidism    Impotence of organic  origin    Murmur    never has caused any problems   Neuromuscular disorder (HCC)    neuropathy bilateral feet   Other chest pain    tightness, pressure   Palpitation    hx   Precordial pain    Sleep apnea    uses cpap    Family History  Problem Relation Age of Onset   Heart disease Father    Colon cancer Neg Hx    Colon polyps Neg Hx    Esophageal cancer Neg Hx    Rectal cancer Neg Hx    Stomach cancer Neg Hx     Past Surgical History:  Procedure Laterality Date   APPENDECTOMY  2002   done at [facility]   CARDIAC CATHETERIZATION     CARDIAC CATHETERIZATION N/A 08/27/2015   Procedure: Left Heart Cath and Coronary Angiography;  Surgeon: Dolores Patty, MD;  Location: [facility] INVASIVE CV LAB;  Service: Cardiovascular;  Laterality: N/A;   COLONOSCOPY  in his 20's   COLONOSCOPY  2019   EAR CYST EXCISION Left 07/08/2019   Procedure: open excision baker's cyst left knee;  Surgeon: Cammy Copa, MD;  Location: [facility] OR;  Service: Orthopedics;  Laterality: Left;   HERNIA REPAIR  05/15/2013   umbilical   JOINT REPLACEMENT     right knee   KNEE ARTHROSCOPY Left 12/17/2018   Procedure: left knee arthroscopy, meniscal debridement, loose body removal;  Surgeon: Cammy Copa, MD;  Location: [facility] OR;  Service: Orthopedics;  Laterality: Left;   REVERSE SHOULDER ARTHROPLASTY Right 08/10/2022   Procedure: RIGHT REVERSE SHOULDER ARTHROPLASTY, BICEPS TENODESIS;  Surgeon: Cammy Copa, MD;  Location: [facility] OR;  Service: Orthopedics;  Laterality: Right;   REVISION TOTAL KNEE ARTHROPLASTY Right 06/23/2014   DR [name]   SHOULDER ARTHROSCOPY WITH ROTATOR CUFF REPAIR AND SUBACROMIAL DECOMPRESSION Right 03/07/2022   Procedure: RIGHT SHOULDER ARTHROSCOPY, DEBRIDEMENT, MINI OPEN ROTATOR CUFF TEAR REPAIR;  Surgeon: Cammy Copa, MD;  Location: [facility] OR;  Service: Orthopedics;  Laterality: Right;   TONSILLECTOMY     TOTAL KNEE ARTHROPLASTY Right 2011   right   TOTAL KNEE ARTHROPLASTY Left  02/10/2021   Procedure: LEFT TOTAL KNEE ARTHROPLASTY;  Surgeon: Cammy Copa, MD;  Location: [facility] OR;  Service: Orthopedics;  Laterality: Left;   TOTAL KNEE REVISION Right 06/23/2014   Procedure: TOTAL KNEE REVISION;  Surgeon: Cammy Copa, MD;  Location: [facility] OR;  Service: Orthopedics;  Laterality: Right;   UPPER GASTROINTESTINAL ENDOSCOPY  04/30/2013   Demetrius Charity TOOTH EXTRACTION     Social History   Occupational History   Occupation: Acupuncturist: Advertising copywriter   Occupation: SERVICE DIRECTOR    Employer: [employer]  Tobacco Use   Smoking status: Former    Packs/day: 2.00    Years: 18.00    Additional pack years: 0.00    Total pack years: 36.00    Types: Cigarettes    Quit date:  04/24/2005    Years since quitting: 17.4   Smokeless tobacco: Never   Tobacco comments:    quit 20 years ago   Vaping Use   Vaping Use: Never used  Substance and Sexual Activity   Alcohol use: Yes    Alcohol/week: 4.0 standard drinks of alcohol    Types: 4 Cans of beer per week    Comment: Occasionally   Drug use: No   Sexual activity: Yes

## 2022-09-21 ENCOUNTER — Telehealth: Payer: Self-pay

## 2022-09-21 ENCOUNTER — Ambulatory Visit: Payer: Medicare HMO

## 2022-09-21 DIAGNOSIS — M25511 Pain in right shoulder: Secondary | ICD-10-CM | POA: Diagnosis not present

## 2022-09-21 DIAGNOSIS — Z96611 Presence of right artificial shoulder joint: Secondary | ICD-10-CM | POA: Diagnosis not present

## 2022-09-21 DIAGNOSIS — M19011 Primary osteoarthritis, right shoulder: Secondary | ICD-10-CM | POA: Diagnosis not present

## 2022-09-21 DIAGNOSIS — M25611 Stiffness of right shoulder, not elsewhere classified: Secondary | ICD-10-CM | POA: Diagnosis not present

## 2022-09-21 NOTE — Therapy (Signed)
OUTPATIENT PHYSICAL THERAPY TREATMENT NOTE    Patient Name: [name] MRN: [id number] DOB:21-Mar-1966, 57 y.o., male Today's Date: 09/21/2022  PCP: [name], MD  REFERRING PROVIDER: Cammy Copa, MD   END OF SESSION:  PT End of Session - 09/21/22 1511     Visit Number 6    Number of Visits 25    Date for PT Re-Evaluation 11/24/22    PT Start Time 1511    PT Stop Time 1554    PT Time Calculation (min) 43 min    Activity Tolerance Patient tolerated treatment well    Behavior During Therapy [facility] for tasks assessed/performed                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   Anxiety    Anxiety and depression    Arthritis    knees and shoulders   Benzodiazepine withdrawal with complication (HCC) 01/09/2019   Bruit    L   Chest pain    hx   Cognitive complaints with normal neuropsychological exam 10/23/2016   Diabetes mellitus without complication (HCC)    "borderline", diet controlled, no meds, patient has lost 30 lbs   Edema    Fatty liver    GERD (gastroesophageal reflux disease)    uses Omeprazole   Goiter    HLD (hyperlipidemia)    Hypertension    essential, benign   Hypothyroidism    Impotence of organic origin    Murmur    never has caused any problems   Neuromuscular disorder (HCC)    neuropathy bilateral feet   Other chest pain    tightness, pressure   Palpitation    hx   Precordial pain    Sleep apnea    uses cpap   Past Surgical History:  Procedure Laterality Date   APPENDECTOMY  2002   done at [facility]   CARDIAC CATHETERIZATION     CARDIAC CATHETERIZATION N/A 08/27/2015   Procedure: Left Heart Cath and Coronary Angiography;  Surgeon: Dolores Patty, MD;  Location: [facility] INVASIVE CV LAB;  Service: Cardiovascular;  Laterality: N/A;   COLONOSCOPY  in his 20's    COLONOSCOPY  2019   EAR CYST EXCISION Left 07/08/2019   Procedure: open excision baker's cyst left knee;  Surgeon: Cammy Copa, MD;  Location: [facility] OR;  Service: Orthopedics;  Laterality: Left;   HERNIA REPAIR  05/15/2013   umbilical   JOINT REPLACEMENT     right knee   KNEE ARTHROSCOPY Left 12/17/2018   Procedure: left knee arthroscopy, meniscal debridement, loose body removal;  Surgeon: Cammy Copa, MD;  Location: [facility] OR;  Service: Orthopedics;  Laterality: Left;   REVERSE SHOULDER ARTHROPLASTY Right 08/10/2022   Procedure: RIGHT REVERSE SHOULDER ARTHROPLASTY, BICEPS TENODESIS;  Surgeon: Cammy Copa, MD;  Location: [facility] OR;  Service: Orthopedics;  Laterality: Right;   REVISION TOTAL KNEE ARTHROPLASTY Right 06/23/2014   DR [name]   SHOULDER ARTHROSCOPY WITH ROTATOR CUFF REPAIR AND SUBACROMIAL DECOMPRESSION Right 03/07/2022   Procedure: RIGHT SHOULDER ARTHROSCOPY, DEBRIDEMENT, MINI OPEN ROTATOR CUFF TEAR REPAIR;  Surgeon: Cammy Copa, MD;  Location: [facility] OR;  Service: Orthopedics;  Laterality: Right;   TONSILLECTOMY     TOTAL KNEE ARTHROPLASTY Right 2011   right   TOTAL KNEE ARTHROPLASTY Left 02/10/2021   Procedure: LEFT TOTAL KNEE ARTHROPLASTY;  Surgeon: Cammy Copa, MD;  Location: [facility] OR;  Service: Orthopedics;  Laterality: Left;   TOTAL KNEE REVISION Right 06/23/2014   Procedure: TOTAL KNEE REVISION;  Surgeon: Cammy Copa, MD;  Location: [facility] OR;  Service: Orthopedics;  Laterality: Right;   UPPER GASTROINTESTINAL ENDOSCOPY  04/30/2013   Demetrius Charity TOOTH EXTRACTION     Patient Active Problem List   Diagnosis Date Noted   Arthritis of right shoulder region 08/20/2022   Retained orthopedic hardware 08/20/2022   S/P reverse total shoulder arthroplasty, right 08/10/2022   Pernicious anemia 10/12/2021   Chronic pain associated with significant psychosocial dysfunction 03/05/2021   History of deep venous thrombosis (DVT) of distal vein of left  lower extremity 03/05/2021   Arthritis of left knee    S/P TKR (total knee replacement), left 02/10/2021   Grief counseling 11/14/2020   Preoperative evaluation to rule out surgical contraindication 09/02/2020   MDD (major depressive disorder), recurrent episode, moderate (HCC) 05/29/2020   Colon cancer screening 02/02/2020   Eczema of both external ears 12/08/2019   1+ pitting edema 12/08/2019   Polyarthritis of multiple sites 10/27/2019   CAD (coronary artery disease) 10/27/2019   Radiculopathy, cervical region 06/10/2019   Carpal tunnel syndrome, left upper limb 06/10/2019   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   Spinal stenosis of cervical region 12/31/2018   Loose body of left knee    Plantar fasciitis of left foot 08/24/2017   Chronic pain of both hips 06/20/2017   Nontraumatic complete tear of right rotator cuff 09/14/2015   Hypothyroidism due to acquired atrophy of thyroid 09/14/2015   Arthritis of knee 06/23/2014   Encounter for preventive health examination 06/17/2014   Enlarged RV (right ventricle) 06/17/2014   GERD (gastroesophageal reflux disease) 04/20/2014   Cervical spine degeneration 02/17/2014   Overweight 10/29/2013   Chronic knee pain 10/29/2013   Diabetes mellitus treated with oral medication (HCC) 10/23/2012   Seborrheic dermatitis, unspecified 05/17/2012   Fatty liver 04/22/2012   Hypogonadism male 03/12/2012   Chronic pain of multiple joints 12/24/2010   Sciatica of left side associated with disorder of lumbar spine 12/24/2010   Hyperlipidemia associated with type 2 diabetes mellitus (HCC) 01/13/2009   Anxiety state 01/13/2009   Essential hypertension 01/13/2009   OSA (obstructive sleep apnea) 01/13/2009    REFERRING DIAG: M19.011 (ICD-10-CM) - Arthritis of right shoulder region Z96.611 (ICD-10-CM) - S/P reverse total shoulder arthroplasty, right   THERAPY DIAG:  Right shoulder pain, unspecified chronicity  Stiffness of right shoulder, not elsewhere  classified  Rationale for Evaluation and Treatment Rehabilitation  PERTINENT HISTORY: S/P R reverse TSA on 08/10/2022. Has not yet had PT since then. Pt is R hand dominant. Was given pedulums to do at home following his procedure. Pt fell off a ladder 3 years ago. Underwent a RTC repair which did not hold, resulting in his current shoulder replacement surgery.   PRECAUTIONS: Reverse TSA precautions   No lifting > 5 lbs    SUBJECTIVE:   SUBJECTIVE STATEMENT: R shoulder is about a 3/10 today.     PAIN:  Are you having pain? See subjective   TODAY'S TREATMENT:  DATE: 09/21/2022  R reverse TSA on 08/10/2022     Per surgeon note on 08/25/2022 "Plan is discontinue sling. Okay to very lightly lift with the operative extremity but no lifting anything heavier than a coffee cup or cell phone. Start physical therapy to focus on passive range of motion and active range of motion with deltoid isometrics. Do not want to externally rotate past 30 degrees to protect subscapularis repair. Follow-up in 4 weeks for clinical recheck. "        No latex allergies Blood pressure overall controlled by medication.    Surgical incision anterior shoulder/deltoid area, with steri strips, healing satisfactorily.      Therapeutic exercise  Pt currently on his 6th week post op  Blood pressure L arm sitting, mechanically taken, normal cuff: 114/61, HR 83  Supine R shoulder PROM             With L UE performing movement                          Flexion 10x   136 degrees flexion in supine               With PT    Scaption 10x3                          Abduction 10x3   Supine punches A/AROM (shoulder flexion to 90 degrees with elbow bent)  5x with PT assist PRN     Seated manually resisted submax (less than 50% effort) deltoid isometrics in neutral   Flexion  10x5 seconds for 2 sets   Scaption 10x5 seconds for 2 sets   Abduction 10x5 seconds for 2 sets     Extension 10x5 seconds for 2 set   Seated scapular retraction 10x2 with 5 second holds  R shoulder discomfort, eases with rest.       Improved exercise technique, movement at target joints, use of target muscles after mod verbal, visual, tactile cues.      Response to treatment Fair tolerance to today's session.        Clinical impression Pt currently on his 6th  week post op. Continued with A/AROM, and deltoid isometrics to improve ROM and strength Fair tolerance to today's session. Pt will benefit from continued skilled physical therapy services to improve ROM, strength and function.        PATIENT EDUCATION: Education details: there-ex, HEP Person educated: Patient Education method: Explanation, Demonstration, Tactile cues, Verbal cues, and Handouts Education comprehension: verbalized understanding and returned demonstration  HOME EXERCISE PROGRAM: Access Code: [email or web address] URL: [email or web address] Date: 08/30/2022 Prepared by: Loralyn Freshwater   Exercises - Supine Shoulder Flexion PROM  - 3 x daily - 7 x weekly - 3 sets - 10 reps       PT Short Term Goals - 09/19/22 1559       PT SHORT TERM GOAL #1   Title Pt will be independent with his initial HEP to improve R shoulder ROM, strength and function.    Baseline Pt has started his initial HEP (08/30/2022); Pt performing his HEP independently (09/19/2022)    Time 3    Period Weeks    Status Achieved    Target Date 09/22/22              PT Long Term Goals - 09/19/22 1520       PT LONG TERM GOAL #1  Title Pt will imrove R shoulder flexion and abduction AROM to 140 degrees or more to promote ability to reach with less difficulty.    Baseline R shoulder PROM 120 degrees flexion, 90 degrees abduction (08/30/2022); supine R shoulder AAROM flexion 139 degrees,  abduction 108 degrees (09/19/2022)     Time 12    Period Weeks    Status Partially Met    Target Date 11/24/22      PT LONG TERM GOAL #2   Title Pt will improve R shoulder ER AROM to at least 70 degrees to promote ability to reach behind his head.    Baseline R shoulder PROM ER with arm at side 30 degrees (08/30/2022); able to reach 30 degrees with arm at side (09/19/2022)    Time 12    Period Weeks    Status On-going    Target Date 11/24/22      PT LONG TERM GOAL #3   Title Pt will have at least 4/5 R shoulder flexion, abduction, ER and IR strength to promote ability to use his R UE for functional tasks.    Baseline R shoulder strength not yet tested to allow for more healing. (08/30/2022), (09/19/2022)    Time 12    Period Weeks    Status On-going    Target Date 11/24/22      PT LONG TERM GOAL #4   Title Pt will improve his R shoulder FOTO score by at least 10 points as a demonstration of improved function.    Baseline R shoulder FOTO 39 (08/30/2022)    Time 12    Period Weeks    Status On-going    Target Date 11/24/22              Plan - 09/21/22 1507     Clinical Impression Statement Pt currently on his 6th  week post op. Continued with A/AROM, and deltoid isometrics to improve ROM and strength Fair tolerance to today's session. Pt will benefit from continued skilled physical therapy services to improve ROM, strength and function.    Personal Factors and Comorbidities Comorbidity 3+;Past/Current Experience;Time since onset of injury/illness/exacerbation;Fitness    Comorbidities Anxiety, depression, arthritis, DM, HTN, sleep apnea    Examination-Activity Limitations Bathing;Dressing;Bed Mobility;Hygiene/Grooming;Lift;Caring for Others;Carry;Reach Overhead;Toileting    Stability/Clinical Decision Making Stable/Uncomplicated    Rehab Potential Fair    PT Frequency 2x / week    PT Duration 12 weeks    PT Treatment/Interventions Therapeutic exercise;Therapeutic activities;Neuromuscular re-education;Patient/family  education;Manual techniques;Electrical Stimulation;Passive range of motion    PT Next Visit Plan PROM, A/AROM, manual techniques, modalities PRN    PT Home Exercise Plan Medbridge Access Code: [email or web address]    Consulted and Agree with Plan of Care Patient              Loralyn Freshwater PT, DPT  09/21/2022, 4:04 PM

## 2022-09-21 NOTE — Telephone Encounter (Signed)
-----   Message from Cammy Copa, MD sent at 09/20/2022 10:16 PM EDT ----- Cresenciano Lick can you have him follow-up with [name] in 6 weeks thanks

## 2022-09-21 NOTE — Telephone Encounter (Signed)
Can you please make sure appt is scheduled per Dr [name]?

## 2022-09-22 ENCOUNTER — Telehealth: Payer: Self-pay | Admitting: Orthopedic Surgery

## 2022-09-22 NOTE — Telephone Encounter (Signed)
Called patient left message to return call. Note: appointment has been reschedule with [name] at 1:00 pm per Dr. [name]

## 2022-10-03 ENCOUNTER — Ambulatory Visit: Payer: Medicare HMO | Attending: Orthopedic Surgery

## 2022-10-03 DIAGNOSIS — M25611 Stiffness of right shoulder, not elsewhere classified: Secondary | ICD-10-CM | POA: Insufficient documentation

## 2022-10-03 DIAGNOSIS — M25511 Pain in right shoulder: Secondary | ICD-10-CM | POA: Insufficient documentation

## 2022-10-03 NOTE — Therapy (Signed)
OUTPATIENT PHYSICAL THERAPY TREATMENT NOTE    Patient Name: [name] MRN: [id number] DOB:1966/01/26, 57 y.o., male Today's Date: 10/03/2022  PCP: [name], MD  REFERRING PROVIDER: Cammy Copa, MD   END OF SESSION:  PT End of Session - 10/03/22 0901     Visit Number 7    Number of Visits 25    Date for PT Re-Evaluation 11/24/22    PT Start Time 0902    PT Stop Time 0945    PT Time Calculation (min) 43 min    Activity Tolerance Patient tolerated treatment well    Behavior During Therapy [facility] for tasks assessed/performed                 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   Anxiety    Anxiety and depression    Arthritis    knees and shoulders   Benzodiazepine withdrawal with complication (HCC) 01/09/2019   Bruit    L   Chest pain    hx   Cognitive complaints with normal neuropsychological exam 10/23/2016   Diabetes mellitus without complication (HCC)    "borderline", diet controlled, no meds, patient has lost 30 lbs   Edema    Fatty liver    GERD (gastroesophageal reflux disease)    uses Omeprazole   Goiter    HLD (hyperlipidemia)    Hypertension    essential, benign   Hypothyroidism    Impotence of organic origin    Murmur    never has caused any problems   Neuromuscular disorder (HCC)    neuropathy bilateral feet   Other chest pain    tightness, pressure   Palpitation    hx   Precordial pain    Sleep apnea    uses cpap   Past Surgical History:  Procedure Laterality Date   APPENDECTOMY  2002   done at [facility]   CARDIAC CATHETERIZATION     CARDIAC CATHETERIZATION N/A 08/27/2015   Procedure: Left Heart Cath and Coronary Angiography;  Surgeon: Dolores Patty, MD;  Location: [facility] INVASIVE CV LAB;  Service: Cardiovascular;  Laterality: N/A;   COLONOSCOPY  in his 20's    COLONOSCOPY  2019   EAR CYST EXCISION Left 07/08/2019   Procedure: open excision baker's cyst left knee;  Surgeon: Cammy Copa, MD;  Location: [facility] OR;  Service: Orthopedics;  Laterality: Left;   HERNIA REPAIR  05/15/2013   umbilical   JOINT REPLACEMENT     right knee   KNEE ARTHROSCOPY Left 12/17/2018   Procedure: left knee arthroscopy, meniscal debridement, loose body removal;  Surgeon: Cammy Copa, MD;  Location: [facility] OR;  Service: Orthopedics;  Laterality: Left;   REVERSE SHOULDER ARTHROPLASTY Right 08/10/2022   Procedure: RIGHT REVERSE SHOULDER ARTHROPLASTY, BICEPS TENODESIS;  Surgeon: Cammy Copa, MD;  Location: [facility] OR;  Service: Orthopedics;  Laterality: Right;   REVISION TOTAL KNEE ARTHROPLASTY Right 06/23/2014   DR [name]   SHOULDER ARTHROSCOPY WITH ROTATOR CUFF REPAIR AND SUBACROMIAL DECOMPRESSION Right 03/07/2022   Procedure: RIGHT SHOULDER ARTHROSCOPY, DEBRIDEMENT, MINI OPEN ROTATOR CUFF TEAR REPAIR;  Surgeon: Cammy Copa, MD;  Location: [facility] OR;  Service: Orthopedics;  Laterality: Right;   TONSILLECTOMY     TOTAL KNEE ARTHROPLASTY Right 2011   right   TOTAL KNEE ARTHROPLASTY Left 02/10/2021   Procedure: LEFT TOTAL KNEE ARTHROPLASTY;  Surgeon: Cammy Copa, MD;  Location: [facility] OR;  Service: Orthopedics;  Laterality: Left;   TOTAL KNEE REVISION Right 06/23/2014   Procedure: TOTAL KNEE REVISION;  Surgeon: Cammy Copa, MD;  Location: [facility] OR;  Service: Orthopedics;  Laterality: Right;   UPPER GASTROINTESTINAL ENDOSCOPY  04/30/2013   Demetrius Charity TOOTH EXTRACTION     Patient Active Problem List   Diagnosis Date Noted   Arthritis of right shoulder region 08/20/2022   Retained orthopedic hardware 08/20/2022   S/P reverse total shoulder arthroplasty, right 08/10/2022   Pernicious anemia 10/12/2021   Chronic pain associated with significant psychosocial dysfunction 03/05/2021   History of deep venous thrombosis (DVT) of distal vein of left  lower extremity 03/05/2021   Arthritis of left knee    S/P TKR (total knee replacement), left 02/10/2021   Grief counseling 11/14/2020   Preoperative evaluation to rule out surgical contraindication 09/02/2020   MDD (major depressive disorder), recurrent episode, moderate (HCC) 05/29/2020   Colon cancer screening 02/02/2020   Eczema of both external ears 12/08/2019   1+ pitting edema 12/08/2019   Polyarthritis of multiple sites 10/27/2019   CAD (coronary artery disease) 10/27/2019   Radiculopathy, cervical region 06/10/2019   Carpal tunnel syndrome, left upper limb 06/10/2019   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   Spinal stenosis of cervical region 12/31/2018   Loose body of left knee    Plantar fasciitis of left foot 08/24/2017   Chronic pain of both hips 06/20/2017   Nontraumatic complete tear of right rotator cuff 09/14/2015   Hypothyroidism due to acquired atrophy of thyroid 09/14/2015   Arthritis of knee 06/23/2014   Encounter for preventive health examination 06/17/2014   Enlarged RV (right ventricle) 06/17/2014   GERD (gastroesophageal reflux disease) 04/20/2014   Cervical spine degeneration 02/17/2014   Overweight 10/29/2013   Chronic knee pain 10/29/2013   Diabetes mellitus treated with oral medication (HCC) 10/23/2012   Seborrheic dermatitis, unspecified 05/17/2012   Fatty liver 04/22/2012   Hypogonadism male 03/12/2012   Chronic pain of multiple joints 12/24/2010   Sciatica of left side associated with disorder of lumbar spine 12/24/2010   Hyperlipidemia associated with type 2 diabetes mellitus (HCC) 01/13/2009   Anxiety state 01/13/2009   Essential hypertension 01/13/2009   OSA (obstructive sleep apnea) 01/13/2009    REFERRING DIAG: M19.011 (ICD-10-CM) - Arthritis of right shoulder region Z96.611 (ICD-10-CM) - S/P reverse total shoulder arthroplasty, right   THERAPY DIAG:  Right shoulder pain, unspecified chronicity  Stiffness of right shoulder, not elsewhere  classified  Rationale for Evaluation and Treatment Rehabilitation  PERTINENT HISTORY: S/P R reverse TSA on 08/10/2022. Has not yet had PT since then. Pt is R hand dominant. Was given pedulums to do at home following his procedure. Pt fell off a ladder 3 years ago. Underwent a RTC repair which did not hold, resulting in his current shoulder replacement surgery.   PRECAUTIONS: Reverse TSA precautions   No lifting > 5 lbs    SUBJECTIVE:   SUBJECTIVE STATEMENT: R shoulder is doing pretty good, still a lot of soreness at the very top (superior anterior shoulder), 4/10 currently.  Has a follow up appointment with Dr. Lorin Picket on 11/01/2022    PAIN:  Are you having pain? See subjective   TODAY'S TREATMENT:  DATE: 10/03/2022  R reverse TSA on 08/10/2022     Per surgeon note on 08/25/2022 "Plan is discontinue sling. Okay to very lightly lift with the operative extremity but no lifting anything heavier than a coffee cup or cell phone. Start physical therapy to focus on passive range of motion and active range of motion with deltoid isometrics. Do not want to externally rotate past 30 degrees to protect subscapularis repair. Follow-up in 4 weeks for clinical recheck. "     No latex allergies Blood pressure overall controlled by medication.       Therapeutic exercise  Pt currently on his 8th week post op  Blood pressure L arm sitting, mechanically taken, normal cuff: 102/52, HR 104 No lightheadedness, dizziness   Seated pulleys AAROM  Flexion 10x5 seconds for 3 sets  Scaption 10x5 seconds for 3 sets  Abduction 3x5 seconds. Uncomfortable  Seated R shoulder ER AAROM with dowel 10x  Not past 30 degrees  Seated R shoulder AROM to 90 degrees  Flexion 5x2  Scaption 3x. Uncomfortable. Eases with rest.   Seated scapular retraction 10x2 with 5 second  holds    Standing R shoulder submax (less than 50% effort) deltoid isometrics in neutral    Flexion 10x5 seconds    Abduction 10x5 seconds      Improved exercise technique, movement at target joints, use of target muscles after mod verbal, visual, tactile cues.      Response to treatment Pt tolerated session well without aggravation of symptoms.        Clinical impression Pt currently on his 8th  week post op. Continued with A/AROM, and deltoid isometrics to improve ROM and strength. Able to perform R shoulder AROM flexion and scaption to at least 90 degrees. Pt tolerated session well without aggravation of symptoms. Pt will benefit from continued skilled physical therapy services to improve ROM, strength and function.        PATIENT EDUCATION: Education details: there-ex, HEP Person educated: Patient Education method: Explanation, Demonstration, Tactile cues, Verbal cues, and Handouts Education comprehension: verbalized understanding and returned demonstration  HOME EXERCISE PROGRAM: Access Code: [email or web address] URL: [email or web address] Date: 08/30/2022 Prepared by: Loralyn Freshwater   Exercises - Supine Shoulder Flexion PROM  - 3 x daily - 7 x weekly - 3 sets - 10 reps  - Seated Shoulder Flexion AAROM with Pulley Behind  - 3 x daily - 7 x weekly - 3 sets - 10 reps - 5 seconds hold - Standing Shoulder Scaption AAROM with Pulley Behind  - 3 x daily - 7 x weekly - 3 sets - 10 reps - 5 seconds hold     PT Short Term Goals - 09/19/22 1559       PT SHORT TERM GOAL #1   Title Pt will be independent with his initial HEP to improve R shoulder ROM, strength and function.    Baseline Pt has started his initial HEP (08/30/2022); Pt performing his HEP independently (09/19/2022)    Time 3    Period Weeks    Status Achieved    Target Date 09/22/22              PT Long Term Goals - 09/19/22 1520       PT LONG TERM GOAL #1   Title Pt will imrove R shoulder flexion  and abduction AROM to 140 degrees or more to promote ability to reach with less difficulty.    Baseline R shoulder PROM 120 degrees flexion, 90 degrees abduction (  08/30/2022); supine R shoulder AAROM flexion 139 degrees,  abduction 108 degrees (09/19/2022)    Time 12    Period Weeks    Status Partially Met    Target Date 11/24/22      PT LONG TERM GOAL #2   Title Pt will improve R shoulder ER AROM to at least 70 degrees to promote ability to reach behind his head.    Baseline R shoulder PROM ER with arm at side 30 degrees (08/30/2022); able to reach 30 degrees with arm at side (09/19/2022)    Time 12    Period Weeks    Status On-going    Target Date 11/24/22      PT LONG TERM GOAL #3   Title Pt will have at least 4/5 R shoulder flexion, abduction, ER and IR strength to promote ability to use his R UE for functional tasks.    Baseline R shoulder strength not yet tested to allow for more healing. (08/30/2022), (09/19/2022)    Time 12    Period Weeks    Status On-going    Target Date 11/24/22      PT LONG TERM GOAL #4   Title Pt will improve his R shoulder FOTO score by at least 10 points as a demonstration of improved function.    Baseline R shoulder FOTO 39 (08/30/2022)    Time 12    Period Weeks    Status On-going    Target Date 11/24/22              Plan - 10/03/22 0853     Clinical Impression Statement Pt currently on his 8th  week post op. Continued with A/AROM, and deltoid isometrics to improve ROM and strength. Able to perform R shoulder AROM flexion and scaption to at least 90 degrees. Pt tolerated session well without aggravation of symptoms. Pt will benefit from continued skilled physical therapy services to improve ROM, strength and function.    Personal Factors and Comorbidities Comorbidity 3+;Past/Current Experience;Time since onset of injury/illness/exacerbation;Fitness    Comorbidities Anxiety, depression, arthritis, DM, HTN, sleep apnea    Examination-Activity Limitations  Bathing;Dressing;Bed Mobility;Hygiene/Grooming;Lift;Caring for Others;Carry;Reach Overhead;Toileting    Stability/Clinical Decision Making Stable/Uncomplicated    Rehab Potential Fair    PT Frequency 2x / week    PT Duration 12 weeks    PT Treatment/Interventions Therapeutic exercise;Therapeutic activities;Neuromuscular re-education;Patient/family education;Manual techniques;Electrical Stimulation;Passive range of motion    PT Next Visit Plan PROM, A/AROM, manual techniques, modalities PRN    PT Home Exercise Plan Medbridge Access Code: [email or web address]    Consulted and Agree with Plan of Care Patient              Loralyn Freshwater PT, DPT  10/03/2022, 9:51 AM

## 2022-10-06 ENCOUNTER — Encounter (HOSPITAL_COMMUNITY): Payer: Self-pay | Admitting: Psychiatry

## 2022-10-06 ENCOUNTER — Telehealth (HOSPITAL_BASED_OUTPATIENT_CLINIC_OR_DEPARTMENT_OTHER): Payer: Medicare HMO | Admitting: Psychiatry

## 2022-10-06 VITALS — Wt 225.0 lb

## 2022-10-06 DIAGNOSIS — F331 Major depressive disorder, recurrent, moderate: Secondary | ICD-10-CM | POA: Diagnosis not present

## 2022-10-06 DIAGNOSIS — F419 Anxiety disorder, unspecified: Secondary | ICD-10-CM | POA: Diagnosis not present

## 2022-10-06 DIAGNOSIS — F4321 Adjustment disorder with depressed mood: Secondary | ICD-10-CM

## 2022-10-06 MED ORDER — ARIPIPRAZOLE 5 MG PO TABS
ORAL_TABLET | ORAL | 0 refills | Status: DC
Start: 2022-10-06 — End: 2023-01-05

## 2022-10-06 MED ORDER — BUPROPION HCL ER (XL) 300 MG PO TB24: 300.0000 mg | ORAL_TABLET | Freq: Every day | ORAL | 0 refills | Status: DC

## 2022-10-06 NOTE — Progress Notes (Signed)
[facility] MD Virtual Progress Note   Patient Location: Home Provider Location: Home Office  I connect with patient by video and verified that I am speaking with correct person by using two identifiers. I discussed the limitations of evaluation and management by telemedicine and the availability of in person appointments. I also discussed with the patient that there may be a patient responsible charge related to this service. The patient expressed understanding and agreed to proceed.  [name] [id number] 57 y.o.  10/06/2022 10:44 AM  History of Present Illness:  Patient is evaluated by video session.  He has been doing better lately since started the grief counseling through the church.  He is still waiting for the autopsy report of her daughter who found that in bathtub few months ago.  Patient told his son is now taking antidepressant and that has been helping him a lot.  Patient was recently admitted to the hospital for shoulder surgery and found to have high blood pressure.  He is now ttaking to antihypertensive medication.  Patient reported blood pressure is now under control.  He was prescribed narcotic medication but he is no longer taking it.  He reported his blood sugar is slightly increased from the past but is now watching his calorie and cut down his bread and sweet things.  He started going back to his routine as does help his friend who is Curator and has a shop.  He also very close to his son.  Denies any crying spells or any feeling of hopelessness or worthlessness but does feel dysphoric when he think about his daughter.  His last hemoglobin A1c was 7.1 marginally increased from 6.8.  He is taking Klonopin, BuSpar, trazodone from her primary care.  Likely combination of Abilify and Wellbutrin along with other medications.  He has no tremors, shakes or any EPS.  Patient has good social network.  He denies drinking or using any illegal substances.  Past  Psychiatric History: H/O depression and anxiety.  No h/o suicidal attempt or inpatient treatment.  Seen Dr. Edwin Dada and given Zoloft, Klonopin and Cymbalta.  Later PCP added BuSpar and trazodone.  We tried Lamictal but did not help.     Outpatient Encounter Medications as of 10/06/2022  Medication Sig   amLODipine (NORVASC) 5 MG tablet Take 1 tablet (5 mg total) by mouth daily.   ARIPiprazole (ABILIFY) 5 MG tablet Take one tab at bed time   aspirin EC 81 MG tablet Take 1 tablet (81 mg total) by mouth daily at 6 PM. Swallow whole.   atorvastatin (LIPITOR) 80 MG tablet Take 1 tablet (80 mg total) by mouth daily.   Blood Glucose Monitoring Suppl (ACCU-CHEK AVIVA PLUS) w/Device KIT Use to check blood sugars up to 4 times daily. ICD-10 E11.40   buPROPion (WELLBUTRIN XL) 300 MG 24 hr tablet Take 1 tablet (300 mg total) by mouth daily.   busPIRone (BUSPAR) 15 MG tablet Take 1 tablet (15 mg total) by mouth 3 (three) times daily.   carvedilol (COREG) 12.5 MG tablet Take 1 tablet (12.5 mg total) by mouth 2 (two) times daily.   celecoxib (CELEBREX) 100 MG capsule Take 1 capsule by mouth twice daily   clonazePAM (KLONOPIN) 2 MG tablet TAKE 1/2 (ONE-HALF) TABLET BY MOUTH TWICE DAILY AS NEEDED FOR ANXIETY   dicyclomine (BENTYL) 20 MG tablet TAKE 1 TABLET BY MOUTH EVERY 6 HOURS   docusate sodium (COLACE) 100 MG capsule Take 1 capsule (100 mg total) by mouth  2 (two) times daily.   doxycycline (VIBRA-TABS) 100 MG tablet Take 1 tablet (100 mg total) by mouth every 12 (twelve) hours.   empagliflozin (JARDIANCE) 10 MG TABS tablet Take 10 mg by mouth daily.   ezetimibe (ZETIA) 10 MG tablet Take 1 tablet (10 mg total) by mouth daily.   folic acid (FOLVITE) 1 MG tablet Take 1 tablet (1 mg total) by mouth daily.   furosemide (LASIX) 20 MG tablet Take 1 tablet (20 mg total) by mouth 2 (two) times a week.   gabapentin (NEURONTIN) 300 MG capsule 3 tablets in the morning and evening ,  2 tablets at night   glucose blood  (ACCU-CHEK AVIVA PLUS) test strip Use to check blood sugars up to 4 times daily. ICD-10 E11.40   hydrALAZINE (APRESOLINE) 25 MG tablet Take 1 tablet (25 mg total) by mouth 3 (three) times daily. (Patient taking differently: Take 25 mg by mouth 3 (three) times daily. FOR ELEVATIONS   IN BETWEEN SCHEDULED MEDS   the dose of  25 MG CAN BE TAKEN  for readings > 150/90)   isosorbide mononitrate (IMDUR) 30 MG 24 hr tablet Take 1 tablet (30 mg total) by mouth daily.   Lancets (ACCU-CHEK MULTICLIX) lancets Use to to check blood sugars up to 4 times daily. ICD-10: E11.40   levothyroxine (SYNTHROID) 88 MCG tablet Take 1 tablet (88 mcg total) by mouth daily before breakfast.   losartan (COZAAR) 100 MG tablet Take 1 tablet by mouth once daily   metFORMIN (GLUCOPHAGE) 500 MG tablet TAKE 1 TABLET BY MOUTH TWICE DAILY WITH A MEAL   methocarbamol (ROBAXIN) 500 MG tablet Take 1 tablet (500 mg total) by mouth every 8 (eight) hours as needed.   Multiple Vitamin (MULTIVITAMIN WITH MINERALS) TABS tablet Take 1 tablet by mouth daily.   naloxone (NARCAN) nasal spray 4 mg/0.1 mL Use as needed for excessive sedation /opioid overdose   omeprazole (PRILOSEC) 40 MG capsule TAKE 1 CAPSULE BY MOUTH IN THE MORNING AND 1 CAPSULE IN THE EVENING   oxyCODONE (OXY IR/ROXICODONE) 5 MG immediate release tablet Take 1 tablet (5 mg total) by mouth every 6 (six) hours as needed for moderate pain (pain score 4-6).   promethazine (PHENERGAN) 12.5 MG tablet Take 1 tablet (12.5 mg total) by mouth every 4 (four) hours as needed for nausea or vomiting. (Patient taking differently: Take 12.5 mg by mouth every 8 (eight) hours as needed for nausea or vomiting.)   traZODone (DESYREL) 100 MG tablet TAKE 1 & 1/2 (ONE & ONE-HALF) TABLETS BY MOUTH AT BEDTIME   No facility-administered encounter medications on file as of 10/06/2022.    Recent Results (from the past 2160 hour(s))  HgB A1c     Status: Abnormal   Collection Time: 07/26/22  2:10 PM   Result Value Ref Range   Hgb A1c MFr Bld 7.1 (H) 4.6 - 6.5 %    Comment: Glycemic Control Guidelines for People with Diabetes:Non Diabetic:  <6%Goal of Therapy: <7%Additional Action Suggested:  >8%   Lipid Profile     Status: Abnormal   Collection Time: 07/26/22  2:10 PM  Result Value Ref Range   Cholesterol 185 0 - 200 mg/dL    Comment: ATP III Classification       Desirable:  < 200 mg/dL               Borderline High:  200 - 239 mg/dL          High:  > =  240 mg/dL   Triglycerides 161.0 (H) 0.0 - 149.0 mg/dL    Comment: Normal:  <960 mg/dLBorderline High:  150 - 199 mg/dL   HDL 45.40 (L) >98.11 mg/dL   VLDL 91.4 (H) 0.0 - 78.2 mg/dL   Total CHOL/HDL Ratio 5     Comment:                Men          Women1/2 Average Risk     3.4          3.3Average Risk          5.0          4.42X Average Risk          9.6          7.13X Average Risk          15.0          11.0                       NonHDL 151.55     Comment: NOTE:  Non-HDL goal should be 30 mg/dL higher than patient's LDL goal (i.e. LDL goal of < 70 mg/dL, would have non-HDL goal of < 100 mg/dL)  Direct LDL     Status: None   Collection Time: 07/26/22  2:10 PM  Result Value Ref Range   Direct LDL 129.0 mg/dL    Comment: Optimal:  <956 mg/dLNear or Above Optimal:  100-129 mg/dLBorderline High:  130-159 mg/dLHigh:  160-189 mg/dLVery High:  >190 mg/dL  Comp Met (CMET)     Status: Abnormal   Collection Time: 07/26/22  2:10 PM  Result Value Ref Range   Sodium 138 135 - 145 mEq/L   Potassium 4.2 3.5 - 5.1 mEq/L   Chloride 100 96 - 112 mEq/L   CO2 31 19 - 32 mEq/L   Glucose, Bld 150 (H) 70 - 99 mg/dL   BUN 18 6 - 23 mg/dL   Creatinine, Ser 2.13 0.40 - 1.50 mg/dL   Total Bilirubin 0.3 0.2 - 1.2 mg/dL   Alkaline Phosphatase 112 39 - 117 U/L   AST 15 0 - 37 U/L   ALT 16 0 - 53 U/L   Total Protein 6.2 6.0 - 8.3 g/dL   Albumin 4.0 3.5 - 5.2 g/dL   GFR 08.65 >78.46 mL/min    Comment: Calculated using the CKD-EPI Creatinine Equation (2021)    Calcium 9.1 8.4 - 10.5 mg/dL  TSH     Status: None   Collection Time: 07/26/22  2:10 PM  Result Value Ref Range   TSH 2.16 0.35 - 5.50 uIU/mL  Glucose, capillary     Status: Abnormal   Collection Time: 08/02/22 10:34 AM  Result Value Ref Range   Glucose-Capillary 187 (H) 70 - 99 mg/dL    Comment: Glucose reference range applies only to samples taken after fasting for at least 8 hours.  Urinalysis, Routine w reflex microscopic -Urine, Clean Catch     Status: Abnormal   Collection Time: 08/02/22 11:47 AM  Result Value Ref Range   Color, Urine YELLOW YELLOW   APPearance CLEAR CLEAR   Specific Gravity, Urine 1.022 1.005 - 1.030   pH 5.0 5.0 - 8.0   Glucose, UA >=500 (A) NEGATIVE mg/dL   Hgb urine dipstick NEGATIVE NEGATIVE   Bilirubin Urine NEGATIVE NEGATIVE   Ketones, ur NEGATIVE NEGATIVE mg/dL   Protein, ur NEGATIVE NEGATIVE mg/dL   Nitrite NEGATIVE  NEGATIVE   Leukocytes,Ua NEGATIVE NEGATIVE   RBC / HPF 0-5 0 - 5 RBC/hpf   WBC, UA 0-5 0 - 5 WBC/hpf   Bacteria, UA NONE SEEN NONE SEEN   Squamous Epithelial / HPF 0-5 0 - 5 /HPF    Comment: Performed at [facility], [address], [place], [place] [address]  Surgical pcr screen     Status: Abnormal   Collection Time: 08/02/22 11:52 AM   Specimen: Nasal Mucosa; Nasal Swab  Result Value Ref Range   MRSA, PCR NEGATIVE NEGATIVE   Staphylococcus aureus POSITIVE (A) NEGATIVE    Comment: (NOTE) The Xpert SA Assay (FDA approved for NASAL specimens in patients 32 years of age and older), is one component of a comprehensive surveillance program. It is not intended to diagnose infection nor to guide or monitor treatment. Performed at [facility], [address], [place], [place] [address]   CBC     Status: None   Collection Time: 08/02/22 11:59 AM  Result Value Ref Range   WBC 5.7 4.0 - 10.5 K/uL   RBC 4.91 4.22 - 5.81 MIL/uL   Hemoglobin 15.0 13.0 - 17.0 g/dL   HCT 09.8 11.9 - 14.7 %   MCV 94.1 80.0 - 100.0 fL    MCH 30.5 26.0 - 34.0 pg   MCHC 32.5 30.0 - 36.0 g/dL   RDW 82.9 56.2 - 13.0 %   Platelets 240 150 - 400 K/uL   nRBC 0.0 0.0 - 0.2 %    Comment: Performed at [facility], [address], [place], [place] [address]  Basic metabolic panel     Status: Abnormal   Collection Time: 08/02/22 11:59 AM  Result Value Ref Range   Sodium 136 135 - 145 mmol/L   Potassium 3.9 3.5 - 5.1 mmol/L   Chloride 100 98 - 111 mmol/L   CO2 29 22 - 32 mmol/L   Glucose, Bld 160 (H) 70 - 99 mg/dL    Comment: Glucose reference range applies only to samples taken after fasting for at least 8 hours.   BUN 14 6 - 20 mg/dL   Creatinine, Ser 4.69 0.61 - 1.24 mg/dL   Calcium 8.7 (L) 8.9 - 10.3 mg/dL   GFR, Estimated >62 >95 mL/min    Comment: (NOTE) Calculated using the CKD-EPI Creatinine Equation (2021)    Anion gap 7 5 - 15    Comment: Performed at [facility], [address] [place], [place] [address]  Glucose, capillary     Status: Abnormal   Collection Time: 08/10/22  5:52 AM  Result Value Ref Range   Glucose-Capillary 119 (H) 70 - 99 mg/dL    Comment: Glucose reference range applies only to samples taken after fasting for at least 8 hours.  Glucose, capillary     Status: Abnormal   Collection Time: 08/10/22  3:07 PM  Result Value Ref Range   Glucose-Capillary 106 (H) 70 - 99 mg/dL    Comment: Glucose reference range applies only to samples taken after fasting for at least 8 hours.  Glucose, capillary     Status: Abnormal   Collection Time: 08/10/22  4:26 PM  Result Value Ref Range   Glucose-Capillary 130 (H) 70 - 99 mg/dL    Comment: Glucose reference range applies only to samples taken after fasting for at least 8 hours.  Glucose, capillary     Status: Abnormal   Collection Time: 08/10/22  8:12 PM  Result Value Ref Range   Glucose-Capillary 119 (H) 70 - 99 mg/dL    Comment: Glucose reference range applies only to samples taken after fasting for at least 8 hours.  Glucose, capillary      Status: Abnormal   Collection Time: 08/11/22  7:58 AM  Result Value Ref Range   Glucose-Capillary 137 (H) 70 - 99 mg/dL    Comment: Glucose reference range applies only to samples taken after fasting for at least 8 hours.  Glucose, capillary     Status: Abnormal   Collection Time: 08/11/22 11:32 AM  Result Value Ref Range   Glucose-Capillary 112 (H) 70 - 99 mg/dL    Comment: Glucose reference range applies only to samples taken after fasting for at least 8 hours.  Glucose, capillary     Status: Abnormal   Collection Time: 08/11/22  4:56 PM  Result Value Ref Range   Glucose-Capillary 140 (H) 70 - 99 mg/dL    Comment: Glucose reference range applies only to samples taken after fasting for at least 8 hours.  Glucose, capillary     Status: Abnormal   Collection Time: 08/11/22  9:50 PM  Result Value Ref Range   Glucose-Capillary 148 (H) 70 - 99 mg/dL    Comment: Glucose reference range applies only to samples taken after fasting for at least 8 hours.  Glucose, capillary     Status: Abnormal   Collection Time: 08/12/22  7:42 AM  Result Value Ref Range   Glucose-Capillary 123 (H) 70 - 99 mg/dL    Comment: Glucose reference range applies only to samples taken after fasting for at least 8 hours.  Glucose, capillary     Status: None   Collection Time: 08/12/22 11:39 AM  Result Value Ref Range   Glucose-Capillary 98 70 - 99 mg/dL    Comment: Glucose reference range applies only to samples taken after fasting for at least 8 hours.  Glucose, capillary     Status: None   Collection Time: 08/12/22  3:04 PM  Result Value Ref Range   Glucose-Capillary 99 70 - 99 mg/dL    Comment: Glucose reference range applies only to samples taken after fasting for at least 8 hours.  CBC     Status: Abnormal   Collection Time: 08/12/22  4:43 PM  Result Value Ref Range   WBC 6.0 4.0 - 10.5 K/uL   RBC 3.55 (L) 4.22 - 5.81 MIL/uL   Hemoglobin 11.0 (L) 13.0 - 17.0 g/dL   HCT 16.1 (L) 09.6 - 04.5 %   MCV 95.2  80.0 - 100.0 fL   MCH 31.0 26.0 - 34.0 pg   MCHC 32.5 30.0 - 36.0 g/dL   RDW 40.9 81.1 - 91.4 %   Platelets 135 (L) 150 - 400 K/uL   nRBC 0.0 0.0 - 0.2 %    Comment: Performed at [facility], [address], [place], [place] [address]  Basic metabolic panel     Status: Abnormal   Collection Time: 08/12/22  4:43 PM  Result Value Ref Range   Sodium 133 (L) 135 - 145 mmol/L   Potassium 4.3 3.5 - 5.1 mmol/L   Chloride 99 98 - 111 mmol/L   CO2 24 22 - 32 mmol/L   Glucose, Bld 105 (H) 70 - 99 mg/dL    Comment: Glucose reference range applies only to samples taken after fasting for at least 8 hours.   BUN 17 6 - 20 mg/dL   Creatinine, Ser 6.21  0.61 - 1.24 mg/dL   Calcium 8.2 (L) 8.9 - 10.3 mg/dL   GFR, Estimated >16 >10 mL/min    Comment: (NOTE) Calculated using the CKD-EPI Creatinine Equation (2021)    Anion gap 10 5 - 15    Comment: Performed at [facility], [address] [place], [place] [address]  Glucose, capillary     Status: None   Collection Time: 08/12/22  5:52 PM  Result Value Ref Range   Glucose-Capillary 99 70 - 99 mg/dL    Comment: Glucose reference range applies only to samples taken after fasting for at least 8 hours.  Glucose, capillary     Status: None   Collection Time: 08/12/22  8:11 PM  Result Value Ref Range   Glucose-Capillary 89 70 - 99 mg/dL    Comment: Glucose reference range applies only to samples taken after fasting for at least 8 hours.  Urinalysis, Routine w reflex microscopic -Urine, Clean Catch     Status: Abnormal   Collection Time: 08/13/22  3:10 AM  Result Value Ref Range   Color, Urine YELLOW YELLOW   APPearance CLEAR CLEAR   Specific Gravity, Urine 1.016 1.005 - 1.030   pH 5.0 5.0 - 8.0   Glucose, UA 50 (A) NEGATIVE mg/dL   Hgb urine dipstick NEGATIVE NEGATIVE   Bilirubin Urine NEGATIVE NEGATIVE   Ketones, ur 20 (A) NEGATIVE mg/dL   Protein, ur 30 (A) NEGATIVE mg/dL   Nitrite NEGATIVE NEGATIVE   Leukocytes,Ua NEGATIVE  NEGATIVE   RBC / HPF 0-5 0 - 5 RBC/hpf   WBC, UA 0-5 0 - 5 WBC/hpf   Bacteria, UA NONE SEEN NONE SEEN   Squamous Epithelial / HPF 0-5 0 - 5 /HPF   Mucus PRESENT    Hyaline Casts, UA PRESENT    Granular Casts, UA PRESENT     Comment: Performed at [facility], [address], [place], [place] [address]  Glucose, capillary     Status: None   Collection Time: 08/13/22  7:16 AM  Result Value Ref Range   Glucose-Capillary 93 70 - 99 mg/dL    Comment: Glucose reference range applies only to samples taken after fasting for at least 8 hours.  Glucose, capillary     Status: Abnormal   Collection Time: 08/13/22 11:33 AM  Result Value Ref Range   Glucose-Capillary 104 (H) 70 - 99 mg/dL    Comment: Glucose reference range applies only to samples taken after fasting for at least 8 hours.  Glucose, capillary     Status: Abnormal   Collection Time: 08/13/22  4:54 PM  Result Value Ref Range   Glucose-Capillary 111 (H) 70 - 99 mg/dL    Comment: Glucose reference range applies only to samples taken after fasting for at least 8 hours.  Glucose, capillary     Status: Abnormal   Collection Time: 08/13/22  9:40 PM  Result Value Ref Range   Glucose-Capillary 101 (H) 70 - 99 mg/dL    Comment: Glucose reference range applies only to samples taken after fasting for at least 8 hours.  CBC with Differential/Platelet     Status: Abnormal   Collection Time: 08/14/22  3:52 AM  Result Value Ref Range   WBC 4.1 4.0 - 10.5 K/uL   RBC 3.01 (L) 4.22 - 5.81 MIL/uL   Hemoglobin 9.4 (L) 13.0 - 17.0 g/dL   HCT 19.1 (L) 47.8 - 29.5 %   MCV 93.4 80.0 - 100.0 fL   MCH  31.2 26.0 - 34.0 pg   MCHC 33.5 30.0 - 36.0 g/dL   RDW 08.6 57.8 - 46.9 %   Platelets 155 150 - 400 K/uL   nRBC 0.0 0.0 - 0.2 %   Neutrophils Relative % 68 %   Neutro Abs 2.9 1.7 - 7.7 K/uL   Lymphocytes Relative 12 %   Lymphs Abs 0.5 (L) 0.7 - 4.0 K/uL   Monocytes Relative 12 %   Monocytes Absolute 0.5 0.1 - 1.0 K/uL   Eosinophils Relative  6 %   Eosinophils Absolute 0.3 0.0 - 0.5 K/uL   Basophils Relative 1 %   Basophils Absolute 0.0 0.0 - 0.1 K/uL   Immature Granulocytes 1 %   Abs Immature Granulocytes 0.02 0.00 - 0.07 K/uL    Comment: Performed at [facility], [address], [place], [place] [address]  Comprehensive metabolic panel     Status: Abnormal   Collection Time: 08/14/22  3:52 AM  Result Value Ref Range   Sodium 137 135 - 145 mmol/L   Potassium 3.7 3.5 - 5.1 mmol/L   Chloride 101 98 - 111 mmol/L   CO2 28 22 - 32 mmol/L   Glucose, Bld 97 70 - 99 mg/dL    Comment: Glucose reference range applies only to samples taken after fasting for at least 8 hours.   BUN 15 6 - 20 mg/dL   Creatinine, Ser 8.41 0.61 - 1.24 mg/dL   Calcium 7.9 (L) 8.9 - 10.3 mg/dL   Total Protein 5.3 (L) 6.5 - 8.1 g/dL   Albumin 2.5 (L) 3.5 - 5.0 g/dL   AST 18 15 - 41 U/L   ALT 12 0 - 44 U/L   Alkaline Phosphatase 68 38 - 126 U/L   Total Bilirubin 0.7 0.3 - 1.2 mg/dL   GFR, Estimated >32 >44 mL/min    Comment: (NOTE) Calculated using the CKD-EPI Creatinine Equation (2021)    Anion gap 8 5 - 15    Comment: Performed at [facility], [address] [place], [place] [address]  Magnesium     Status: None   Collection Time: 08/14/22  3:52 AM  Result Value Ref Range   Magnesium 1.9 1.7 - 2.4 mg/dL    Comment: Performed at [facility], [address], [place], [place] [address]  Glucose, capillary     Status: None   Collection Time: 08/14/22  9:08 AM  Result Value Ref Range   Glucose-Capillary 91 70 - 99 mg/dL    Comment: Glucose reference range applies only to samples taken after fasting for at least 8 hours.  Glucose, capillary     Status: Abnormal   Collection Time: 08/14/22 11:23 AM  Result Value Ref Range   Glucose-Capillary 109 (H) 70 - 99 mg/dL    Comment: Glucose reference range applies only to samples taken after fasting for at least 8 hours.  Glucose, capillary     Status: None   Collection Time:  08/14/22  4:02 PM  Result Value Ref Range   Glucose-Capillary 86 70 - 99 mg/dL    Comment: Glucose reference range applies only to samples taken after fasting for at least 8 hours.  Glucose, capillary     Status: None   Collection Time: 08/14/22  7:33 PM  Result Value Ref Range   Glucose-Capillary 92 70 - 99 mg/dL    Comment: Glucose reference range applies only to samples taken after fasting for at least 8 hours.  Comment 1 Notify RN   Glucose, capillary     Status: Abnormal   Collection Time: 08/14/22  9:23 PM  Result Value Ref Range   Glucose-Capillary 69 (L) 70 - 99 mg/dL    Comment: Glucose reference range applies only to samples taken after fasting for at least 8 hours.  Glucose, capillary     Status: None   Collection Time: 08/14/22  9:40 PM  Result Value Ref Range   Glucose-Capillary 86 70 - 99 mg/dL    Comment: Glucose reference range applies only to samples taken after fasting for at least 8 hours.  Glucose, capillary     Status: None   Collection Time: 08/15/22  7:42 AM  Result Value Ref Range   Glucose-Capillary 89 70 - 99 mg/dL    Comment: Glucose reference range applies only to samples taken after fasting for at least 8 hours.  Glucose, capillary     Status: Abnormal   Collection Time: 08/15/22 12:34 PM  Result Value Ref Range   Glucose-Capillary 108 (H) 70 - 99 mg/dL    Comment: Glucose reference range applies only to samples taken after fasting for at least 8 hours.  Glucose, capillary     Status: Abnormal   Collection Time: 08/15/22  5:12 PM  Result Value Ref Range   Glucose-Capillary 115 (H) 70 - 99 mg/dL    Comment: Glucose reference range applies only to samples taken after fasting for at least 8 hours.  Glucose, capillary     Status: Abnormal   Collection Time: 08/15/22  8:06 PM  Result Value Ref Range   Glucose-Capillary 152 (H) 70 - 99 mg/dL    Comment: Glucose reference range applies only to samples taken after fasting for at least 8 hours.  Glucose,  capillary     Status: Abnormal   Collection Time: 08/16/22  7:38 AM  Result Value Ref Range   Glucose-Capillary 103 (H) 70 - 99 mg/dL    Comment: Glucose reference range applies only to samples taken after fasting for at least 8 hours.   Comment 1 Notify RN    Comment 2 Document in Chart   Brain natriuretic peptide     Status: Abnormal   Collection Time: 08/16/22  8:32 AM  Result Value Ref Range   B Natriuretic Peptide 187.7 (H) 0.0 - 100.0 pg/mL    Comment: Performed at [facility], [address], [place], [place] [address]  Basic metabolic panel     Status: Abnormal   Collection Time: 08/16/22  8:32 AM  Result Value Ref Range   Sodium 137 135 - 145 mmol/L   Potassium 3.2 (L) 3.5 - 5.1 mmol/L   Chloride 99 98 - 111 mmol/L   CO2 27 22 - 32 mmol/L   Glucose, Bld 115 (H) 70 - 99 mg/dL    Comment: Glucose reference range applies only to samples taken after fasting for at least 8 hours.   BUN 14 6 - 20 mg/dL   Creatinine, Ser 6.04 0.61 - 1.24 mg/dL   Calcium 9.0 8.9 - 54.0 mg/dL   GFR, Estimated >98 >11 mL/min    Comment: (NOTE) Calculated using the CKD-EPI Creatinine Equation (2021)    Anion gap 11 5 - 15    Comment: Performed at [facility], [address], [place], [place] [address]  Magnesium     Status: None   Collection Time: 08/16/22  8:32 AM  Result Value Ref Range   Magnesium 1.9 1.7 -  2.4 mg/dL    Comment: Performed at [facility], [address] [place], [place] [address]  CBC     Status: None   Collection Time: 09/11/22 11:15 AM  Result Value Ref Range   WBC 6.4 4.0 - 10.5 K/uL   RBC 4.45 4.22 - 5.81 MIL/uL   Hemoglobin 13.6 13.0 - 17.0 g/dL   HCT 60.4 54.0 - 98.1 %   MCV 92.8 80.0 - 100.0 fL   MCH 30.6 26.0 - 34.0 pg   MCHC 32.9 30.0 - 36.0 g/dL   RDW 19.1 47.8 - 29.5 %   Platelets 268 150 - 400 K/uL   nRBC 0.0 0.0 - 0.2 %    Comment: Performed at [facility], [address] [place], [place] [address]  Basic metabolic panel      Status: Abnormal   Collection Time: 09/11/22 11:15 AM  Result Value Ref Range   Sodium 137 135 - 145 mmol/L   Potassium 3.9 3.5 - 5.1 mmol/L   Chloride 100 98 - 111 mmol/L   CO2 28 22 - 32 mmol/L   Glucose, Bld 101 (H) 70 - 99 mg/dL    Comment: Glucose reference range applies only to samples taken after fasting for at least 8 hours.   BUN 14 6 - 20 mg/dL   Creatinine, Ser 8.65 0.61 - 1.24 mg/dL   Calcium 8.6 (L) 8.9 - 10.3 mg/dL   GFR, Estimated >78 >46 mL/min    Comment: (NOTE) Calculated using the CKD-EPI Creatinine Equation (2021)    Anion gap 9 5 - 15    Comment: Performed at [place] [facility], [address] [place], [place] [address]  Troponin I (High Sensitivity)     Status: None   Collection Time: 09/11/22 11:15 AM  Result Value Ref Range   Troponin I (High Sensitivity) 7 <18 ng/L    Comment: (NOTE) Elevated high sensitivity troponin I (hsTnI) values and significant  changes across serial measurements may suggest ACS but many other  chronic and acute conditions are known to elevate hsTnI results.  Refer to the "Links" section for chest pain algorithms and additional  guidance. Performed at [facility], [address] [place], [place] [address]      Psychiatric Specialty Exam: Physical Exam  Review of Systems  Weight 225 lb (102.1 kg).There is no height or weight on file to calculate BMI.  General Appearance: Casual  Eye Contact:  Good  Speech:  Normal Rate  Volume:  Normal  Mood:  Dysphoric  Affect:  Appropriate  Thought Process:  Goal Directed  Orientation:  Full (Time, Place, and Person)  Thought Content:  WDL  Suicidal Thoughts:  No  Homicidal Thoughts:  No  Memory:  Immediate;   Good Recent;   Good Remote;   Fair  Judgement:  Intact  Insight:  Present  Psychomotor Activity:  Normal  Concentration:  Concentration: Good and Attention Span: Good  Recall:  Good  Fund of Knowledge:  Good  Language:  Good  Akathisia:  No  Handed:   Right  AIMS (if indicated):     Assets:  Communication Skills Desire for Improvement Housing Resilience Social Support Transportation  ADL's:  Intact  Cognition:  WNL  Sleep:  ok     Assessment/Plan: MDD (major depressive disorder), recurrent episode, moderate (HCC) - Plan: buPROPion (WELLBUTRIN XL) 300 MG 24 hr tablet, ARIPiprazole (ABILIFY) 5 MG tablet  Anxiety - Plan: buPROPion (WELLBUTRIN XL) 300 MG 24 hr tablet  Grief  Review blood work results, recent hospitalization for shoulder surgery.  I reviewed her medication.  He is no longer taking narcotic medication but now taking medicine for high blood pressure.  His sugar is slightly increased from the past.  He does not want to change the medication since he feels keeping him stable.  He would like to continue grief counseling through the church once a month.  He is getting BuSpar, Klonopin and trazodone from his primary care.  He also like to keep the Wellbutrin and Abilify.  I recommend consider cutting down the Abilify due to high blood sugar but patient insists that he has been working and is trying to cut down his calorie intake and increase his physical activity to help his better blood sugar.  We will continue Abilify 5 mg daily and Wellbutrin XL 300 mg daily.  Recommend to call us back if any question, concern or if he feel worsening of the symptoms.   Follow Up Instructions:     I discussed the assessment and treatment plan with the patient. The patient was provided an opportunity to ask questions and all were answered. The patient agreed with the plan and demonstrated an understanding of the instructions.   The patient was advised to call back or seek an in-person evaluation if the symptoms worsen or if the condition fails to improve as anticipated.    Collaboration of Care: Other provider involved in patient's care AEB notes are available in epic to review.  Patient/Guardian was advised Release of Information must be  obtained prior to any record release in order to collaborate their care with an outside provider. Patient/Guardian was advised if they have not already done so to contact the registration department to sign all necessary forms in order for [place] to release information regarding their care.   Consent: Patient/Guardian gives verbal consent for treatment and assignment of benefits for services provided during this visit. Patient/Guardian expressed understanding and agreed to proceed.     I provided 30 minutes of non face to face time during this encounter.  Note: This document was prepared by [employer] voice dictation technology and any errors that results from this process are unintentional.    Cleotis Nipper, MD 10/06/2022

## 2022-10-08 ENCOUNTER — Other Ambulatory Visit (HOSPITAL_COMMUNITY): Payer: Self-pay | Admitting: Psychiatry

## 2022-10-08 ENCOUNTER — Other Ambulatory Visit: Payer: Self-pay | Admitting: Surgical

## 2022-10-08 DIAGNOSIS — F331 Major depressive disorder, recurrent, moderate: Secondary | ICD-10-CM

## 2022-10-09 ENCOUNTER — Other Ambulatory Visit: Payer: Self-pay | Admitting: Internal Medicine

## 2022-10-11 ENCOUNTER — Ambulatory Visit: Payer: Medicare HMO

## 2022-10-11 DIAGNOSIS — M25611 Stiffness of right shoulder, not elsewhere classified: Secondary | ICD-10-CM

## 2022-10-11 DIAGNOSIS — M25511 Pain in right shoulder: Secondary | ICD-10-CM | POA: Diagnosis not present

## 2022-10-11 NOTE — Therapy (Signed)
OUTPATIENT PHYSICAL THERAPY TREATMENT NOTE    Patient Name: [name] MRN: [id number] DOB:02/22/1966, 57 y.o., male Today's Date: 10/11/2022  PCP: [name], MD  REFERRING PROVIDER: Cammy Copa, MD   END OF SESSION:  PT End of Session - 10/11/22 1513     Visit Number 8    Number of Visits 25    Date for PT Re-Evaluation 11/24/22    PT Start Time 1513    PT Stop Time 1555    PT Time Calculation (min) 42 min    Activity Tolerance Patient tolerated treatment well    Behavior During Therapy [facility] for tasks assessed/performed                  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   Anxiety    Anxiety and depression    Arthritis    knees and shoulders   Benzodiazepine withdrawal with complication (HCC) 01/09/2019   Bruit    L   Chest pain    hx   Cognitive complaints with normal neuropsychological exam 10/23/2016   Diabetes mellitus without complication (HCC)    "borderline", diet controlled, no meds, patient has lost 30 lbs   Edema    Fatty liver    GERD (gastroesophageal reflux disease)    uses Omeprazole   Goiter    HLD (hyperlipidemia)    Hypertension    essential, benign   Hypothyroidism    Impotence of organic origin    Murmur    never has caused any problems   Neuromuscular disorder (HCC)    neuropathy bilateral feet   Other chest pain    tightness, pressure   Palpitation    hx   Precordial pain    Sleep apnea    uses cpap   Past Surgical History:  Procedure Laterality Date   APPENDECTOMY  2002   done at [facility]   CARDIAC CATHETERIZATION     CARDIAC CATHETERIZATION N/A 08/27/2015   Procedure: Left Heart Cath and Coronary Angiography;  Surgeon: Dolores Patty, MD;  Location: [facility] INVASIVE CV LAB;  Service: Cardiovascular;  Laterality: N/A;   COLONOSCOPY  in his 20's    COLONOSCOPY  2019   EAR CYST EXCISION Left 07/08/2019   Procedure: open excision baker's cyst left knee;  Surgeon: Cammy Copa, MD;  Location: [facility] OR;  Service: Orthopedics;  Laterality: Left;   HERNIA REPAIR  05/15/2013   umbilical   JOINT REPLACEMENT     right knee   KNEE ARTHROSCOPY Left 12/17/2018   Procedure: left knee arthroscopy, meniscal debridement, loose body removal;  Surgeon: Cammy Copa, MD;  Location: [facility] OR;  Service: Orthopedics;  Laterality: Left;   REVERSE SHOULDER ARTHROPLASTY Right 08/10/2022   Procedure: RIGHT REVERSE SHOULDER ARTHROPLASTY, BICEPS TENODESIS;  Surgeon: Cammy Copa, MD;  Location: [facility] OR;  Service: Orthopedics;  Laterality: Right;   REVISION TOTAL KNEE ARTHROPLASTY Right 06/23/2014   DR [name]   SHOULDER ARTHROSCOPY WITH ROTATOR CUFF REPAIR AND SUBACROMIAL DECOMPRESSION Right 03/07/2022   Procedure: RIGHT SHOULDER ARTHROSCOPY, DEBRIDEMENT, MINI OPEN ROTATOR CUFF TEAR REPAIR;  Surgeon: Cammy Copa, MD;  Location: [facility] OR;  Service: Orthopedics;  Laterality: Right;   TONSILLECTOMY     TOTAL KNEE ARTHROPLASTY Right 2011   right   TOTAL KNEE ARTHROPLASTY Left 02/10/2021   Procedure: LEFT TOTAL KNEE  ARTHROPLASTY;  Surgeon: Cammy Copa, MD;  Location: [facility] OR;  Service: Orthopedics;  Laterality: Left;   TOTAL KNEE REVISION Right 06/23/2014   Procedure: TOTAL KNEE REVISION;  Surgeon: Cammy Copa, MD;  Location: [facility] OR;  Service: Orthopedics;  Laterality: Right;   UPPER GASTROINTESTINAL ENDOSCOPY  04/30/2013   Demetrius Charity TOOTH EXTRACTION     Patient Active Problem List   Diagnosis Date Noted   Arthritis of right shoulder region 08/20/2022   Retained orthopedic hardware 08/20/2022   S/P reverse total shoulder arthroplasty, right 08/10/2022   Pernicious anemia 10/12/2021   Chronic pain associated with significant psychosocial dysfunction 03/05/2021   History of deep venous thrombosis (DVT) of distal vein of left  lower extremity 03/05/2021   Arthritis of left knee    S/P TKR (total knee replacement), left 02/10/2021   Grief counseling 11/14/2020   Preoperative evaluation to rule out surgical contraindication 09/02/2020   MDD (major depressive disorder), recurrent episode, moderate (HCC) 05/29/2020   Colon cancer screening 02/02/2020   Eczema of both external ears 12/08/2019   1+ pitting edema 12/08/2019   Polyarthritis of multiple sites 10/27/2019   CAD (coronary artery disease) 10/27/2019   Radiculopathy, cervical region 06/10/2019   Carpal tunnel syndrome, left upper limb 06/10/2019   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   Spinal stenosis of cervical region 12/31/2018   Loose body of left knee    Plantar fasciitis of left foot 08/24/2017   Chronic pain of both hips 06/20/2017   Nontraumatic complete tear of right rotator cuff 09/14/2015   Hypothyroidism due to acquired atrophy of thyroid 09/14/2015   Arthritis of knee 06/23/2014   Encounter for preventive health examination 06/17/2014   Enlarged RV (right ventricle) 06/17/2014   GERD (gastroesophageal reflux disease) 04/20/2014   Cervical spine degeneration 02/17/2014   Overweight 10/29/2013   Chronic knee pain 10/29/2013   Diabetes mellitus treated with oral medication (HCC) 10/23/2012   Seborrheic dermatitis, unspecified 05/17/2012   Fatty liver 04/22/2012   Hypogonadism male 03/12/2012   Chronic pain of multiple joints 12/24/2010   Sciatica of left side associated with disorder of lumbar spine 12/24/2010   Hyperlipidemia associated with type 2 diabetes mellitus (HCC) 01/13/2009   Anxiety state 01/13/2009   Essential hypertension 01/13/2009   OSA (obstructive sleep apnea) 01/13/2009    REFERRING DIAG: M19.011 (ICD-10-CM) - Arthritis of right shoulder region Z96.611 (ICD-10-CM) - S/P reverse total shoulder arthroplasty, right   THERAPY DIAG:  Right shoulder pain, unspecified chronicity  Stiffness of right shoulder, not elsewhere  classified  Rationale for Evaluation and Treatment Rehabilitation  PERTINENT HISTORY: S/P R reverse TSA on 08/10/2022. Has not yet had PT since then. Pt is R hand dominant. Was given pedulums to do at home following his procedure. Pt fell off a ladder 3 years ago. Underwent a RTC repair which did not hold, resulting in his current shoulder replacement surgery.   PRECAUTIONS: Reverse TSA precautions   No lifting > 5 lbs    SUBJECTIVE:   SUBJECTIVE STATEMENT: R anterior shoulder hurts real bad. 7/10 currently. R shoulder started bothering him more about a day or 2 doing the pulley exercises home. Soreness has worsened.      PAIN:  Are you having pain? See subjective   TODAY'S TREATMENT:  DATE: 10/11/2022  R reverse TSA on 08/10/2022     Per surgeon note on 08/25/2022 "Plan is discontinue sling. Okay to very lightly lift with the operative extremity but no lifting anything heavier than a coffee cup or cell phone. Start physical therapy to focus on passive range of motion and active range of motion with deltoid isometrics. Do not want to externally rotate past 30 degrees to protect subscapularis repair. Follow-up in 4 weeks for clinical recheck. "     No latex allergies Blood pressure overall controlled by medication.       Therapeutic exercise  Pt currently on his 9th week post op  Blood pressure L arm sitting, mechanically taken, normal cuff: 156/80, HR 82 No lightheadedness, dizziness  Held off pulley home exercise.   Re-enforced not pushing through pain with HEP. Comfortable range of movement.   Pt verbalized understanding.,  Seated B scapular retraction 10x5 seconds   supine R shoulder AAROM with PT comfortable ROM  Flexion 10x4  Scaption 10x4  Abduction 10x4  Cues not to put weight onto R UE  Pendulums  Circles clockwise and  counter clockwise 1 minute each   Flexion 1 minute  Sitting with arms propped onto mat table for slight R shoulder flexion relaxed position to decrease anterior shoulder pressure   Feels better per pt. Pt was recommended to place his R arm in this position at home. Pt verbalized understanding.    Then with B scapular retraction 10x5 seconds for 2 sets   R shoulder submax manually resisted shoulder extension (for posterior deltoid) 10x5 seconds with PT for 3 sets  Decreased pain to 5/10 afterwards       Improved exercise technique, movement at target joints, use of target muscles after mod verbal, visual, tactile cues.      Response to treatment Pt tolerated session well without aggravation of symptoms. Decreased R shoulder pain to 5/10 at end of session.        Clinical impression  Worked on gentle pain free movement to help calm irritated tissue, decrease stiffness, and decrease pain and pressure to anterior shoulder. Decreased R shoulder pain to 5/10 at end of session. Pt was recommended to take it easy on his R shoulder for the next few days, comfortable range of movement, and continue to utilize ice. Pt verbalized understanding. Pt will benefit from continued skilled physical therapy services to improve ROM, strength and function.        PATIENT EDUCATION: Education details: there-ex, HEP Person educated: Patient Education method: Explanation, Demonstration, Tactile cues, Verbal cues, and Handouts Education comprehension: verbalized understanding and returned demonstration  HOME EXERCISE PROGRAM: Access Code: [email or web address] URL: [email or web address] Date: 08/30/2022 Prepared by: Loralyn Freshwater   Exercises - Supine Shoulder Flexion PROM  - 3 x daily - 7 x weekly - 3 sets - 10 reps    - Seated Shoulder Flexion AAROM with Pulley Behind  - 3 x daily - 7 x weekly - 3 sets - 10 reps - 5 seconds hold - Standing Shoulder Scaption AAROM with Pulley Behind  - 3 x  daily - 7 x weekly - 3 sets - 10 reps - 5 seconds hold Pulleys held off on 10/11/2022    PT Short Term Goals - 09/19/22 1559       PT SHORT TERM GOAL #1   Title Pt will be independent with his initial HEP to improve R shoulder ROM, strength and function.    Baseline Pt has started his initial HEP (  08/30/2022); Pt performing his HEP independently (09/19/2022)    Time 3    Period Weeks    Status Achieved    Target Date 09/22/22              PT Long Term Goals - 09/19/22 1520       PT LONG TERM GOAL #1   Title Pt will imrove R shoulder flexion and abduction AROM to 140 degrees or more to promote ability to reach with less difficulty.    Baseline R shoulder PROM 120 degrees flexion, 90 degrees abduction (08/30/2022); supine R shoulder AAROM flexion 139 degrees,  abduction 108 degrees (09/19/2022)    Time 12    Period Weeks    Status Partially Met    Target Date 11/24/22      PT LONG TERM GOAL #2   Title Pt will improve R shoulder ER AROM to at least 70 degrees to promote ability to reach behind his head.    Baseline R shoulder PROM ER with arm at side 30 degrees (08/30/2022); able to reach 30 degrees with arm at side (09/19/2022)    Time 12    Period Weeks    Status On-going    Target Date 11/24/22      PT LONG TERM GOAL #3   Title Pt will have at least 4/5 R shoulder flexion, abduction, ER and IR strength to promote ability to use his R UE for functional tasks.    Baseline R shoulder strength not yet tested to allow for more healing. (08/30/2022), (09/19/2022)    Time 12    Period Weeks    Status On-going    Target Date 11/24/22      PT LONG TERM GOAL #4   Title Pt will improve his R shoulder FOTO score by at least 10 points as a demonstration of improved function.    Baseline R shoulder FOTO 39 (08/30/2022)    Time 12    Period Weeks    Status On-going    Target Date 11/24/22              Plan - 10/11/22 1506     Clinical Impression Statement Worked on gentle pain free  movement to help calm irritated tissue, decrease stiffness, and decrease pain and pressure to anterior shoulder. Decreased R shoulder pain to 5/10 at end of session. Pt was recommended to take it easy on his R shoulder for the next few days, comfortable range of movement, and continue to utilize ice. Pt verbalized understanding. Pt will benefit from continued skilled physical therapy services to improve ROM, strength and function.    Personal Factors and Comorbidities Comorbidity 3+;Past/Current Experience;Time since onset of injury/illness/exacerbation;Fitness    Comorbidities Anxiety, depression, arthritis, DM, HTN, sleep apnea    Examination-Activity Limitations Bathing;Dressing;Bed Mobility;Hygiene/Grooming;Lift;Caring for Others;Carry;Reach Overhead;Toileting    Stability/Clinical Decision Making Stable/Uncomplicated    Rehab Potential Fair    PT Frequency 2x / week    PT Duration 12 weeks    PT Treatment/Interventions Therapeutic exercise;Therapeutic activities;Neuromuscular re-education;Patient/family education;Manual techniques;Electrical Stimulation;Passive range of motion    PT Next Visit Plan PROM, A/AROM, manual techniques, modalities PRN    PT Home Exercise Plan Medbridge Access Code: [email or web address]    Consulted and Agree with Plan of Care Patient              Loralyn Freshwater PT, DPT  10/11/2022, 4:02 PM

## 2022-10-19 ENCOUNTER — Ambulatory Visit: Payer: Medicare HMO

## 2022-10-19 DIAGNOSIS — M25511 Pain in right shoulder: Secondary | ICD-10-CM | POA: Diagnosis not present

## 2022-10-19 DIAGNOSIS — M25611 Stiffness of right shoulder, not elsewhere classified: Secondary | ICD-10-CM | POA: Diagnosis not present

## 2022-10-19 NOTE — Therapy (Signed)
OUTPATIENT PHYSICAL THERAPY TREATMENT NOTE    Patient Name: [name] MRN: [id number] DOB:11/26/1965, 57 y.o., male Today's Date: 10/19/2022  PCP: [name], MD  REFERRING PROVIDER: Cammy Copa, MD   END OF SESSION:  PT End of Session - 10/19/22 0949     Visit Number 9    Number of Visits 25    Date for PT Re-Evaluation 11/24/22    PT Start Time 0949    PT Stop Time 1030    PT Time Calculation (min) 41 min    Activity Tolerance Patient tolerated treatment well    Behavior During Therapy [facility] for tasks assessed/performed                   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   Anxiety    Anxiety and depression    Arthritis    knees and shoulders   Benzodiazepine withdrawal with complication (HCC) 01/09/2019   Bruit    L   Chest pain    hx   Cognitive complaints with normal neuropsychological exam 10/23/2016   Diabetes mellitus without complication (HCC)    "borderline", diet controlled, no meds, patient has lost 30 lbs   Edema    Fatty liver    GERD (gastroesophageal reflux disease)    uses Omeprazole   Goiter    HLD (hyperlipidemia)    Hypertension    essential, benign   Hypothyroidism    Impotence of organic origin    Murmur    never has caused any problems   Neuromuscular disorder (HCC)    neuropathy bilateral feet   Other chest pain    tightness, pressure   Palpitation    hx   Precordial pain    Sleep apnea    uses cpap   Past Surgical History:  Procedure Laterality Date   APPENDECTOMY  2002   done at [facility]   CARDIAC CATHETERIZATION     CARDIAC CATHETERIZATION N/A 08/27/2015   Procedure: Left Heart Cath and Coronary Angiography;  Surgeon: Dolores Patty, MD;  Location: [facility] INVASIVE CV LAB;  Service: Cardiovascular;  Laterality: N/A;   COLONOSCOPY  in his 20's    COLONOSCOPY  2019   EAR CYST EXCISION Left 07/08/2019   Procedure: open excision baker's cyst left knee;  Surgeon: Cammy Copa, MD;  Location: [facility] OR;  Service: Orthopedics;  Laterality: Left;   HERNIA REPAIR  05/15/2013   umbilical   JOINT REPLACEMENT     right knee   KNEE ARTHROSCOPY Left 12/17/2018   Procedure: left knee arthroscopy, meniscal debridement, loose body removal;  Surgeon: Cammy Copa, MD;  Location: [facility] OR;  Service: Orthopedics;  Laterality: Left;   REVERSE SHOULDER ARTHROPLASTY Right 08/10/2022   Procedure: RIGHT REVERSE SHOULDER ARTHROPLASTY, BICEPS TENODESIS;  Surgeon: Cammy Copa, MD;  Location: [facility] OR;  Service: Orthopedics;  Laterality: Right;   REVISION TOTAL KNEE ARTHROPLASTY Right 06/23/2014   DR [name]   SHOULDER ARTHROSCOPY WITH ROTATOR CUFF REPAIR AND SUBACROMIAL DECOMPRESSION Right 03/07/2022   Procedure: RIGHT SHOULDER ARTHROSCOPY, DEBRIDEMENT, MINI OPEN ROTATOR CUFF TEAR REPAIR;  Surgeon: Cammy Copa, MD;  Location: [facility] OR;  Service: Orthopedics;  Laterality: Right;   TONSILLECTOMY     TOTAL KNEE ARTHROPLASTY Right 2011   right   TOTAL KNEE ARTHROPLASTY Left 02/10/2021   Procedure: LEFT TOTAL  KNEE ARTHROPLASTY;  Surgeon: Cammy Copa, MD;  Location: [facility] OR;  Service: Orthopedics;  Laterality: Left;   TOTAL KNEE REVISION Right 06/23/2014   Procedure: TOTAL KNEE REVISION;  Surgeon: Cammy Copa, MD;  Location: [facility] OR;  Service: Orthopedics;  Laterality: Right;   UPPER GASTROINTESTINAL ENDOSCOPY  04/30/2013   Demetrius Charity TOOTH EXTRACTION     Patient Active Problem List   Diagnosis Date Noted   Arthritis of right shoulder region 08/20/2022   Retained orthopedic hardware 08/20/2022   S/P reverse total shoulder arthroplasty, right 08/10/2022   Pernicious anemia 10/12/2021   Chronic pain associated with significant psychosocial dysfunction 03/05/2021   History of deep venous thrombosis (DVT) of distal vein of left  lower extremity 03/05/2021   Arthritis of left knee    S/P TKR (total knee replacement), left 02/10/2021   Grief counseling 11/14/2020   Preoperative evaluation to rule out surgical contraindication 09/02/2020   MDD (major depressive disorder), recurrent episode, moderate (HCC) 05/29/2020   Colon cancer screening 02/02/2020   Eczema of both external ears 12/08/2019   1+ pitting edema 12/08/2019   Polyarthritis of multiple sites 10/27/2019   CAD (coronary artery disease) 10/27/2019   Radiculopathy, cervical region 06/10/2019   Carpal tunnel syndrome, left upper limb 06/10/2019   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   Spinal stenosis of cervical region 12/31/2018   Loose body of left knee    Plantar fasciitis of left foot 08/24/2017   Chronic pain of both hips 06/20/2017   Nontraumatic complete tear of right rotator cuff 09/14/2015   Hypothyroidism due to acquired atrophy of thyroid 09/14/2015   Arthritis of knee 06/23/2014   Encounter for preventive health examination 06/17/2014   Enlarged RV (right ventricle) 06/17/2014   GERD (gastroesophageal reflux disease) 04/20/2014   Cervical spine degeneration 02/17/2014   Overweight 10/29/2013   Chronic knee pain 10/29/2013   Diabetes mellitus treated with oral medication (HCC) 10/23/2012   Seborrheic dermatitis, unspecified 05/17/2012   Fatty liver 04/22/2012   Hypogonadism male 03/12/2012   Chronic pain of multiple joints 12/24/2010   Sciatica of left side associated with disorder of lumbar spine 12/24/2010   Hyperlipidemia associated with type 2 diabetes mellitus (HCC) 01/13/2009   Anxiety state 01/13/2009   Essential hypertension 01/13/2009   OSA (obstructive sleep apnea) 01/13/2009    REFERRING DIAG: M19.011 (ICD-10-CM) - Arthritis of right shoulder region Z96.611 (ICD-10-CM) - S/P reverse total shoulder arthroplasty, right   THERAPY DIAG:  Right shoulder pain, unspecified chronicity  Stiffness of right shoulder, not elsewhere  classified  Rationale for Evaluation and Treatment Rehabilitation  PERTINENT HISTORY: S/P R reverse TSA on 08/10/2022. Has not yet had PT since then. Pt is R hand dominant. Was given pedulums to do at home following his procedure. Pt fell off a ladder 3 years ago. Underwent a RTC repair which did not hold, resulting in his current shoulder replacement surgery.   PRECAUTIONS: Reverse TSA precautions   No lifting > 5 lbs    SUBJECTIVE:   SUBJECTIVE STATEMENT: R shoulder is about a 4/10 today.   PAIN:  Are you having pain? See subjective   TODAY'S TREATMENT:  DATE: 10/19/2022  R reverse TSA on 08/10/2022     Per surgeon note on 08/25/2022 "Plan is discontinue sling. Okay to very lightly lift with the operative extremity but no lifting anything heavier than a coffee cup or cell phone. Start physical therapy to focus on passive range of motion and active range of motion with deltoid isometrics. Do not want to externally rotate past 30 degrees to protect subscapularis repair. Follow-up in 4 weeks for clinical recheck. "     No latex allergies Blood pressure overall controlled by medication.       Therapeutic exercise  Pt currently on his 10 th week post op  Blood pressure L arm sitting, mechanically taken, normal cuff: 128/63, HR 75  Sitting with arms propped onto mat table for slight R shoulder flexion relaxed position to decrease anterior shoulder pressure     Then with B scapular retraction 10x5 seconds for 3 sets   R shoulder submax shoulder extension isometrics (for posterior deltoid) 10x5 seconds for 2 sets   R Teres major and triceps area discomfort which eases with rest.    With R shoulder flexion AAROM with PT assist, comfortable ROM   Flexion 8x   Anterior shoulder discomfort, eases with rest   supine R shoulder AROM comfortable  ROM  Flexion 10x2  Scaption 10x2  Abduction 10x2 AAROM   Seated gentle manually resisted shoulder extension isometrics with PT 10x5 seconds, sub max effort for 3 sets      Improved exercise technique, movement at target joints, use of target muscles after mod verbal, visual, tactile cues.      Response to treatment Fair tolerance to today's session.    Clinical impression  Continued working on pain free movement to decrease stiffness, and decrease pain and pressure to anterior shoulder. Worked on posterior deltoid strengthening to decrease muscle imbalance. Fair tolerance to today's session. Pt will benefit from continued skilled physical therapy services to improve ROM, strength and function.        PATIENT EDUCATION: Education details: there-ex, HEP Person educated: Patient Education method: Explanation, Demonstration, Tactile cues, Verbal cues, and Handouts Education comprehension: verbalized understanding and returned demonstration  HOME EXERCISE PROGRAM: Access Code: [email or web address] URL: [email or web address] Date: 08/30/2022 Prepared by: Loralyn Freshwater   Exercises - Supine Shoulder Flexion PROM  - 3 x daily - 7 x weekly - 3 sets - 10 reps    - Seated Shoulder Flexion AAROM with Pulley Behind  - 3 x daily - 7 x weekly - 3 sets - 10 reps - 5 seconds hold - Standing Shoulder Scaption AAROM with Pulley Behind  - 3 x daily - 7 x weekly - 3 sets - 10 reps - 5 seconds hold Pulleys held off on 10/11/2022      PT Short Term Goals - 09/19/22 1559       PT SHORT TERM GOAL #1   Title Pt will be independent with his initial HEP to improve R shoulder ROM, strength and function.    Baseline Pt has started his initial HEP (08/30/2022); Pt performing his HEP independently (09/19/2022)    Time 3    Period Weeks    Status Achieved    Target Date 09/22/22              PT Long Term Goals - 09/19/22 1520       PT LONG TERM GOAL #1   Title Pt will imrove R  shoulder flexion and abduction AROM to 140 degrees or more  to promote ability to reach with less difficulty.    Baseline R shoulder PROM 120 degrees flexion, 90 degrees abduction (08/30/2022); supine R shoulder AAROM flexion 139 degrees,  abduction 108 degrees (09/19/2022)    Time 12    Period Weeks    Status Partially Met    Target Date 11/24/22      PT LONG TERM GOAL #2   Title Pt will improve R shoulder ER AROM to at least 70 degrees to promote ability to reach behind his head.    Baseline R shoulder PROM ER with arm at side 30 degrees (08/30/2022); able to reach 30 degrees with arm at side (09/19/2022)    Time 12    Period Weeks    Status On-going    Target Date 11/24/22      PT LONG TERM GOAL #3   Title Pt will have at least 4/5 R shoulder flexion, abduction, ER and IR strength to promote ability to use his R UE for functional tasks.    Baseline R shoulder strength not yet tested to allow for more healing. (08/30/2022), (09/19/2022)    Time 12    Period Weeks    Status On-going    Target Date 11/24/22      PT LONG TERM GOAL #4   Title Pt will improve his R shoulder FOTO score by at least 10 points as a demonstration of improved function.    Baseline R shoulder FOTO 39 (08/30/2022)    Time 12    Period Weeks    Status On-going    Target Date 11/24/22              Plan - 10/19/22 0948     Clinical Impression Statement Continued working on pain free movement to decrease stiffness, and decrease pain and pressure to anterior shoulder. Worked on posterior deltoid strengthening to decrease muscle imbalance. Fair tolerance to today's session. Pt will benefit from continued skilled physical therapy services to improve ROM, strength and function.    Personal Factors and Comorbidities Comorbidity 3+;Past/Current Experience;Time since onset of injury/illness/exacerbation;Fitness    Comorbidities Anxiety, depression, arthritis, DM, HTN, sleep apnea    Examination-Activity Limitations  Bathing;Dressing;Bed Mobility;Hygiene/Grooming;Lift;Caring for Others;Carry;Reach Overhead;Toileting    Stability/Clinical Decision Making Stable/Uncomplicated    Rehab Potential Fair    PT Frequency 2x / week    PT Duration 12 weeks    PT Treatment/Interventions Therapeutic exercise;Therapeutic activities;Neuromuscular re-education;Patient/family education;Manual techniques;Electrical Stimulation;Passive range of motion    PT Next Visit Plan PROM, A/AROM, manual techniques, modalities PRN    PT Home Exercise Plan Medbridge Access Code: [email or web address]    Consulted and Agree with Plan of Care Patient              Loralyn Freshwater PT, DPT  10/19/2022, 4:24 PM

## 2022-10-23 ENCOUNTER — Ambulatory Visit: Payer: Medicare HMO | Attending: Orthopedic Surgery

## 2022-10-23 DIAGNOSIS — M25511 Pain in right shoulder: Secondary | ICD-10-CM | POA: Insufficient documentation

## 2022-10-23 DIAGNOSIS — M25611 Stiffness of right shoulder, not elsewhere classified: Secondary | ICD-10-CM | POA: Insufficient documentation

## 2022-10-25 ENCOUNTER — Ambulatory Visit: Payer: Medicare HMO | Admitting: Internal Medicine

## 2022-10-29 ENCOUNTER — Other Ambulatory Visit: Payer: Self-pay | Admitting: Internal Medicine

## 2022-10-30 ENCOUNTER — Ambulatory Visit: Payer: Medicare HMO

## 2022-10-30 DIAGNOSIS — M25511 Pain in right shoulder: Secondary | ICD-10-CM

## 2022-10-30 DIAGNOSIS — M25611 Stiffness of right shoulder, not elsewhere classified: Secondary | ICD-10-CM | POA: Diagnosis not present

## 2022-10-30 NOTE — Therapy (Signed)
OUTPATIENT PHYSICAL THERAPY TREATMENT NOTE And Progress Report (08/30/2022 - 10/30/2022)    Patient Name: [name] MRN: [id number] DOB:1965-10-01, 57 y.o., male Today's Date: 10/30/2022  PCP: [name], MD  REFERRING PROVIDER: Cammy Copa, MD   END OF SESSION:  PT End of Session - 10/30/22 1337     Visit Number 10    Number of Visits 25    Date for PT Re-Evaluation 11/24/22    PT Start Time 1337    PT Stop Time 1417    PT Time Calculation (min) 40 min    Activity Tolerance Patient tolerated treatment well    Behavior During Therapy [facility] for tasks assessed/performed                    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   Anxiety    Anxiety and depression    Arthritis    knees and shoulders   Benzodiazepine withdrawal with complication (HCC) 01/09/2019   Bruit    L   Chest pain    hx   Cognitive complaints with normal neuropsychological exam 10/23/2016   Diabetes mellitus without complication (HCC)    "borderline", diet controlled, no meds, patient has lost 30 lbs   Edema    Fatty liver    GERD (gastroesophageal reflux disease)    uses Omeprazole   Goiter    HLD (hyperlipidemia)    Hypertension    essential, benign   Hypothyroidism    Impotence of organic origin    Murmur    never has caused any problems   Neuromuscular disorder (HCC)    neuropathy bilateral feet   Other chest pain    tightness, pressure   Palpitation    hx   Precordial pain    Sleep apnea    uses cpap   Past Surgical History:  Procedure Laterality Date   APPENDECTOMY  2002   done at [facility]   CARDIAC CATHETERIZATION     CARDIAC CATHETERIZATION N/A 08/27/2015   Procedure: Left Heart Cath and Coronary Angiography;  Surgeon: Dolores Patty, MD;  Location: [facility] INVASIVE CV LAB;  Service: Cardiovascular;   Laterality: N/A;   COLONOSCOPY  in his 20's   COLONOSCOPY  2019   EAR CYST EXCISION Left 07/08/2019   Procedure: open excision baker's cyst left knee;  Surgeon: Cammy Copa, MD;  Location: [facility] OR;  Service: Orthopedics;  Laterality: Left;   HERNIA REPAIR  05/15/2013   umbilical   JOINT REPLACEMENT     right knee   KNEE ARTHROSCOPY Left 12/17/2018   Procedure: left knee arthroscopy, meniscal debridement, loose body removal;  Surgeon: Cammy Copa, MD;  Location: [facility] OR;  Service: Orthopedics;  Laterality: Left;   REVERSE SHOULDER ARTHROPLASTY Right 08/10/2022   Procedure: RIGHT REVERSE SHOULDER ARTHROPLASTY, BICEPS TENODESIS;  Surgeon: Cammy Copa, MD;  Location: [facility] OR;  Service: Orthopedics;  Laterality: Right;   REVISION TOTAL KNEE ARTHROPLASTY Right 06/23/2014   DR [name]   SHOULDER ARTHROSCOPY WITH ROTATOR CUFF REPAIR AND SUBACROMIAL DECOMPRESSION Right 03/07/2022   Procedure: RIGHT SHOULDER ARTHROSCOPY, DEBRIDEMENT, MINI OPEN ROTATOR CUFF TEAR REPAIR;  Surgeon: Cammy Copa, MD;  Location: [facility] OR;  Service: Orthopedics;  Laterality: Right;   TONSILLECTOMY     TOTAL KNEE ARTHROPLASTY Right 2011   right   TOTAL KNEE ARTHROPLASTY  Left 02/10/2021   Procedure: LEFT TOTAL KNEE ARTHROPLASTY;  Surgeon: Cammy Copa, MD;  Location: [facility] OR;  Service: Orthopedics;  Laterality: Left;   TOTAL KNEE REVISION Right 06/23/2014   Procedure: TOTAL KNEE REVISION;  Surgeon: Cammy Copa, MD;  Location: [facility] OR;  Service: Orthopedics;  Laterality: Right;   UPPER GASTROINTESTINAL ENDOSCOPY  04/30/2013   Demetrius Charity TOOTH EXTRACTION     Patient Active Problem List   Diagnosis Date Noted   Arthritis of right shoulder region 08/20/2022   Retained orthopedic hardware 08/20/2022   S/P reverse total shoulder arthroplasty, right 08/10/2022   Pernicious anemia 10/12/2021   Chronic pain associated with significant psychosocial dysfunction 03/05/2021   History of deep  venous thrombosis (DVT) of distal vein of left lower extremity 03/05/2021   Arthritis of left knee    S/P TKR (total knee replacement), left 02/10/2021   Grief counseling 11/14/2020   Preoperative evaluation to rule out surgical contraindication 09/02/2020   MDD (major depressive disorder), recurrent episode, moderate (HCC) 05/29/2020   Colon cancer screening 02/02/2020   Eczema of both external ears 12/08/2019   1+ pitting edema 12/08/2019   Polyarthritis of multiple sites 10/27/2019   CAD (coronary artery disease) 10/27/2019   Radiculopathy, cervical region 06/10/2019   Carpal tunnel syndrome, left upper limb 06/10/2019   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   Spinal stenosis of cervical region 12/31/2018   Loose body of left knee    Plantar fasciitis of left foot 08/24/2017   Chronic pain of both hips 06/20/2017   Nontraumatic complete tear of right rotator cuff 09/14/2015   Hypothyroidism due to acquired atrophy of thyroid 09/14/2015   Arthritis of knee 06/23/2014   Encounter for preventive health examination 06/17/2014   Enlarged RV (right ventricle) 06/17/2014   GERD (gastroesophageal reflux disease) 04/20/2014   Cervical spine degeneration 02/17/2014   Overweight 10/29/2013   Chronic knee pain 10/29/2013   Diabetes mellitus treated with oral medication (HCC) 10/23/2012   Seborrheic dermatitis, unspecified 05/17/2012   Fatty liver 04/22/2012   Hypogonadism male 03/12/2012   Chronic pain of multiple joints 12/24/2010   Sciatica of left side associated with disorder of lumbar spine 12/24/2010   Hyperlipidemia associated with type 2 diabetes mellitus (HCC) 01/13/2009   Anxiety state 01/13/2009   Essential hypertension 01/13/2009   OSA (obstructive sleep apnea) 01/13/2009    REFERRING DIAG: M19.011 (ICD-10-CM) - Arthritis of right shoulder region Z96.611 (ICD-10-CM) - S/P reverse total shoulder arthroplasty, right   THERAPY DIAG:  Right shoulder pain, unspecified  chronicity  Stiffness of right shoulder, not elsewhere classified  Rationale for Evaluation and Treatment Rehabilitation  PERTINENT HISTORY: S/P R reverse TSA on 08/10/2022. Has not yet had PT since then. Pt is R hand dominant. Was given pedulums to do at home following his procedure. Pt fell off a ladder 3 years ago. Underwent a RTC repair which did not hold, resulting in his current shoulder replacement surgery.   PRECAUTIONS: Reverse TSA precautions   No lifting > 5 lbs    SUBJECTIVE:   SUBJECTIVE STATEMENT: R shoulder is pretty good, about a 3/10 currently. Blood pressure is maintained now. Going to his surgeon this Wednesday.    PAIN:  Are you having pain? See subjective   TODAY'S TREATMENT:  DATE: 10/30/2022  R reverse TSA on 08/10/2022     Per surgeon note on 08/25/2022 "Plan is discontinue sling. Okay to very lightly lift with the operative extremity but no lifting anything heavier than a coffee cup or cell phone. Start physical therapy to focus on passive range of motion and active range of motion with deltoid isometrics. Do not want to externally rotate past 30 degrees to protect subscapularis repair. Follow-up in 4 weeks for clinical recheck. "     No latex allergies Blood pressure overall controlled by medication.       Therapeutic exercise  Pt currently on his 12th week  Standing R shoulder flexion and extension AROM 1x each Supine R shoulder ER in scapular plan AROM 1x  Standing manually resisted R shoulder flexion and abduction 1x each way.   Reviewed progress/current status with PT towards goals.   Supine with R shoulder in 90 degrees flexion   Rhythmic stabilization 20 seconds x 3    Then with shoulder in 125 degrees flexion 20 seconds x 3   Supine R shoulder flexion to 90 degrees 1 lb 10x3  Then 2 lbs 5x. Superior  shoulder discomfort   Supine R shoulder scaption to 90 degrees 1 lb 10x3  Standing R shoulder abduction 1 lbs to 90 degrees 10x3   Standing R shoulder extension with scapular retraction yellow band 10x5 seconds for 3 sets  Comfortable ROM   Standing with PT assist, D2 shoulder flexion at comfortable range with PT assist 8x, then 6x  Difficult for pt.      Improved exercise technique, movement at target joints, use of target muscles after mod verbal, visual, tactile cues.      Response to treatment Fair tolerance to today's session.    Clinical impression Pt currently on his 12 th week status post. Pt demonstrates improved R shoulder AROM, strength and function since initial evaluation. Pt making very good progress with PT towards goals. Demonstrates R shoulder ER weakness observed during PNF D2 shoulder flexion exercise. Fair tolerance to today's session. Pt will benefit from continued skilled physical therapy services to improve ROM, strength and function.        PATIENT EDUCATION: Education details: there-ex, HEP Person educated: Patient Education method: Explanation, Demonstration, Tactile cues, Verbal cues, and Handouts Education comprehension: verbalized understanding and returned demonstration  HOME EXERCISE PROGRAM: Access Code: [email or web address] URL: [email or web address] Date: 08/30/2022 Prepared by: Loralyn Freshwater   Exercises - Supine Shoulder Flexion PROM  - 3 x daily - 7 x weekly - 3 sets - 10 reps    - Seated Shoulder Flexion AAROM with Pulley Behind  - 3 x daily - 7 x weekly - 3 sets - 10 reps - 5 seconds hold - Standing Shoulder Scaption AAROM with Pulley Behind  - 3 x daily - 7 x weekly - 3 sets - 10 reps - 5 seconds hold Pulleys held off on 10/11/2022      PT Short Term Goals - 09/19/22 1559       PT SHORT TERM GOAL #1   Title Pt will be independent with his initial HEP to improve R shoulder ROM, strength and function.    Baseline Pt has  started his initial HEP (08/30/2022); Pt performing his HEP independently (09/19/2022)    Time 3    Period Weeks    Status Achieved    Target Date 09/22/22              PT Long Term Goals -  10/30/22 1339       PT LONG TERM GOAL #1   Title Pt will imrove R shoulder flexion and abduction AROM to 140 degrees or more to promote ability to reach with less difficulty.    Baseline R shoulder PROM 120 degrees flexion, 90 degrees abduction (08/30/2022); supine R shoulder AAROM flexion 139 degrees,  abduction 108 degrees (09/19/2022); R shoulder AROM 140 degrees flexion, 153 degrees (with discomfort) (10/30/2022)    Time 12    Period Weeks    Status Achieved    Target Date 11/24/22      PT LONG TERM GOAL #2   Title Pt will improve R shoulder ER AROM to at least 70 degrees to promote ability to reach behind his head.    Baseline R shoulder PROM ER with arm at side 30 degrees (08/30/2022); able to reach 30 degrees with arm at side (09/19/2022); with shoulder in scapular plane AROM 82 degrees (10/30/2022)    Time 12    Period Weeks    Status Achieved    Target Date 11/24/22      PT LONG TERM GOAL #3   Title Pt will have at least 4/5 R shoulder flexion, abduction, ER and IR strength to promote ability to use his R UE for functional tasks.    Baseline R shoulder strength not yet tested to allow for more healing. (08/30/2022), (09/19/2022); flexion 4/5 (at least), abduction 4+/5 (10/30/2022)    Time 12    Period Weeks    Status Partially Met    Target Date 11/24/22      PT LONG TERM GOAL #4   Title Pt will improve his R shoulder FOTO score by at least 10 points as a demonstration of improved function.    Baseline R shoulder FOTO 39 (08/30/2022); 61 (10/30/2022)    Time 12    Period Weeks    Status On-going    Target Date 11/24/22              Plan - 10/30/22 1331     Clinical Impression Statement Pt currently on his 12 th week status post. Pt demonstrates improved R shoulder AROM, strength and  function since initial evaluation. Pt making very good progress with PT towards goals. Demonstrates R shoulder ER weakness observed during PNF D2 shoulder flexion exercise. Fair tolerance to today's session. Pt will benefit from continued skilled physical therapy services to improve ROM, strength and function.    Personal Factors and Comorbidities Comorbidity 3+;Past/Current Experience;Time since onset of injury/illness/exacerbation;Fitness    Comorbidities Anxiety, depression, arthritis, DM, HTN, sleep apnea    Examination-Activity Limitations Bathing;Dressing;Bed Mobility;Hygiene/Grooming;Lift;Caring for Others;Carry;Reach Overhead;Toileting    Stability/Clinical Decision Making Stable/Uncomplicated    Rehab Potential Fair    PT Frequency 2x / week    PT Duration 12 weeks    PT Treatment/Interventions Therapeutic exercise;Therapeutic activities;Neuromuscular re-education;Patient/family education;Manual techniques;Electrical Stimulation;Passive range of motion    PT Next Visit Plan PROM, A/AROM, manual techniques, modalities PRN    PT Home Exercise Plan Medbridge Access Code: [email or web address]    Consulted and Agree with Plan of Care Patient             Thank you for your referral.  Loralyn Freshwater PT, DPT  10/30/2022, 2:29 PM

## 2022-10-30 NOTE — Telephone Encounter (Signed)
Medication was discontinued on 05/03/2022 due to change in therapy. Is it okay to refuse?

## 2022-11-01 ENCOUNTER — Encounter: Payer: Medicare HMO | Admitting: Orthopedic Surgery

## 2022-11-01 ENCOUNTER — Encounter: Payer: Medicare HMO | Admitting: Surgical

## 2022-11-02 ENCOUNTER — Ambulatory Visit: Payer: Medicare HMO

## 2022-11-02 ENCOUNTER — Telehealth: Payer: Self-pay

## 2022-11-02 NOTE — Telephone Encounter (Signed)
No show. Called patient who said that his mother had to go to the ER yesterday and his mom is still in the hospital today. Was not able to go the the follow up appointment with his surgeon yesterday due to that and will have to reschedule it. Wants to keep his next follow up PT session and will call us next week if anything changes.

## 2022-11-05 ENCOUNTER — Other Ambulatory Visit: Payer: Self-pay | Admitting: Internal Medicine

## 2022-11-05 ENCOUNTER — Other Ambulatory Visit: Payer: Self-pay | Admitting: Surgical

## 2022-11-08 ENCOUNTER — Telehealth: Payer: Self-pay

## 2022-11-08 ENCOUNTER — Ambulatory Visit: Payer: Medicare HMO

## 2022-11-08 NOTE — Telephone Encounter (Signed)
No show. Called patient pertaining to today's session and to see if he wants to continue PT. Return phone call requested. Phone number [phone]) provided.

## 2022-11-26 ENCOUNTER — Other Ambulatory Visit: Payer: Self-pay | Admitting: Internal Medicine

## 2022-11-28 DIAGNOSIS — E119 Type 2 diabetes mellitus without complications: Secondary | ICD-10-CM | POA: Diagnosis not present

## 2022-11-28 DIAGNOSIS — H524 Presbyopia: Secondary | ICD-10-CM | POA: Diagnosis not present

## 2022-11-28 LAB — HM DIABETES EYE EXAM

## 2022-11-30 ENCOUNTER — Telehealth: Payer: Self-pay

## 2022-11-30 ENCOUNTER — Ambulatory Visit (INDEPENDENT_AMBULATORY_CARE_PROVIDER_SITE_OTHER): Payer: Medicare HMO | Admitting: Internal Medicine

## 2022-11-30 ENCOUNTER — Encounter: Payer: Self-pay | Admitting: Internal Medicine

## 2022-11-30 VITALS — BP 124/66 | HR 73 | Temp 98.0°F | Ht 72.0 in | Wt 240.2 lb

## 2022-11-30 DIAGNOSIS — I10 Essential (primary) hypertension: Secondary | ICD-10-CM | POA: Diagnosis not present

## 2022-11-30 DIAGNOSIS — E119 Type 2 diabetes mellitus without complications: Secondary | ICD-10-CM

## 2022-11-30 DIAGNOSIS — F331 Major depressive disorder, recurrent, moderate: Secondary | ICD-10-CM | POA: Diagnosis not present

## 2022-11-30 DIAGNOSIS — M25561 Pain in right knee: Secondary | ICD-10-CM

## 2022-11-30 DIAGNOSIS — Z7984 Long term (current) use of oral hypoglycemic drugs: Secondary | ICD-10-CM | POA: Diagnosis not present

## 2022-11-30 DIAGNOSIS — M25562 Pain in left knee: Secondary | ICD-10-CM

## 2022-11-30 DIAGNOSIS — F411 Generalized anxiety disorder: Secondary | ICD-10-CM

## 2022-11-30 DIAGNOSIS — E785 Hyperlipidemia, unspecified: Secondary | ICD-10-CM

## 2022-11-30 DIAGNOSIS — E1169 Type 2 diabetes mellitus with other specified complication: Secondary | ICD-10-CM

## 2022-11-30 DIAGNOSIS — I824Z2 Acute embolism and thrombosis of unspecified deep veins of left distal lower extremity: Secondary | ICD-10-CM

## 2022-11-30 DIAGNOSIS — Z86718 Personal history of other venous thrombosis and embolism: Secondary | ICD-10-CM

## 2022-11-30 DIAGNOSIS — G894 Chronic pain syndrome: Secondary | ICD-10-CM | POA: Diagnosis not present

## 2022-11-30 DIAGNOSIS — E1140 Type 2 diabetes mellitus with diabetic neuropathy, unspecified: Secondary | ICD-10-CM | POA: Diagnosis not present

## 2022-11-30 DIAGNOSIS — G8929 Other chronic pain: Secondary | ICD-10-CM

## 2022-11-30 LAB — LIPID PANEL
Cholesterol: 124 mg/dL (ref 0–200)
HDL: 30.6 mg/dL — ABNORMAL LOW (ref >39.00)
NonHDL: 93.25
Total CHOL/HDL Ratio: 4
Triglycerides: 325 mg/dL — ABNORMAL HIGH (ref 0.0–149.0)
VLDL: 65 mg/dL — ABNORMAL HIGH (ref 0.0–40.0)

## 2022-11-30 LAB — COMPREHENSIVE METABOLIC PANEL
ALT: 25 U/L (ref 0–53)
AST: 23 U/L (ref 0–37)
Albumin: 4.2 g/dL (ref 3.5–5.2)
Alkaline Phosphatase: 111 U/L (ref 39–117)
BUN: 17 mg/dL (ref 6–23)
CO2: 31 mEq/L (ref 19–32)
Calcium: 9.3 mg/dL (ref 8.4–10.5)
Chloride: 102 mEq/L (ref 96–112)
Creatinine, Ser: 1.12 mg/dL (ref 0.40–1.50)
GFR: 72.64 mL/min (ref >60.00)
Glucose, Bld: 119 mg/dL — ABNORMAL HIGH (ref 70–99)
Potassium: 5 mEq/L (ref 3.5–5.1)
Sodium: 138 mEq/L (ref 135–145)
Total Bilirubin: 0.4 mg/dL (ref 0.2–1.2)
Total Protein: 6.4 g/dL (ref 6.0–8.3)

## 2022-11-30 LAB — LDL CHOLESTEROL, DIRECT: Direct LDL: 79 mg/dL

## 2022-11-30 LAB — HEMOGLOBIN A1C: Hgb A1c MFr Bld: 7.6 % — ABNORMAL HIGH (ref 4.6–6.5)

## 2022-11-30 MED ORDER — HYDROCODONE-ACETAMINOPHEN 10-325 MG PO TABS: 1.0000 | ORAL_TABLET | Freq: Two times a day (BID) | ORAL | 0 refills | Status: DC | PRN

## 2022-11-30 MED ORDER — GABAPENTIN 300 MG PO CAPS: 900.0000 mg | ORAL_CAPSULE | Freq: Two times a day (BID) | ORAL | 0 refills | Status: DC

## 2022-11-30 MED ORDER — METFORMIN HCL 500 MG PO TABS: 500.0000 mg | ORAL_TABLET | Freq: Two times a day (BID) | ORAL | 1 refills | Status: DC

## 2022-11-30 MED ORDER — CLONAZEPAM 1 MG PO TABS: 1.0000 mg | ORAL_TABLET | Freq: Two times a day (BID) | ORAL | 2 refills | Status: DC | PRN

## 2022-11-30 MED ORDER — BUSPIRONE HCL 15 MG PO TABS
15.0000 mg | ORAL_TABLET | Freq: Three times a day (TID) | ORAL | 1 refills | Status: DC
Start: 2022-11-30 — End: 2023-04-11

## 2022-11-30 MED ORDER — LEVOTHYROXINE SODIUM 88 MCG PO TABS: 88.0000 ug | ORAL_TABLET | Freq: Every day | ORAL | 1 refills | Status: DC

## 2022-11-30 NOTE — Telephone Encounter (Signed)
noted 

## 2022-11-30 NOTE — Telephone Encounter (Signed)
I have corrected instructions and resent the clonazepam to [facility]

## 2022-11-30 NOTE — Telephone Encounter (Signed)
[facility] pharmacy called stating they ned clarification on directions of use for his clonazepam. There is two different directions.

## 2022-11-30 NOTE — Assessment & Plan Note (Signed)
Aggravated by his mother's current state /diagnosis of dementia and increased responsibilities i His anxiety is managed with scheduled clonazepam, buspirone And trazodone 100 mg qhs.  Will encourage him to consider actively trying to reduce his dependance on  Clonazepam.  Naloxone prescription sent to [facility] given concurrent use of hydrocodone Refill history confirmed via  Controlled Substance databas, accessed by me today.Marland Kitchen

## 2022-11-30 NOTE — Progress Notes (Signed)
Subjective:  Patient ID: [name], male    DOB: 1965/09/18  Age: 57 y.o. MRN: [id number]  CC: The primary encounter diagnosis was Essential hypertension. Diagnoses of GAD (generalized anxiety disorder), MDD (major depressive disorder), recurrent episode, moderate (HCC), Hyperlipidemia associated with type 2 diabetes mellitus (HCC), Type 2 diabetes mellitus with diabetic neuropathy, without long-term current use of insulin (HCC), Chronic pain associated with significant psychosocial dysfunction, Anxiety state, DVT, lower extremity, distal, acute, left (HCC), Chronic pain of both knees, Diabetes mellitus treated with oral medication (HCC), and History of deep venous thrombosis (DVT) of distal vein of left lower extremity were also pertinent to this visit.   HPI [name] presents for  Chief Complaint  Patient presents with   Medical Management of Chronic Issues    1) Grief:  his mother died of complications from dementia IN HOSPICE JULY 25 . She had experienced A STEEP DECLINE IN HEALTH DURING THE LAST MONTH.  HE WAS AT BEDSIDE .    1) CHRONIC JOINT PAIN.  KNEES DOING OK . NEEDS REFILL ON HYDROCODONE. Hs not had refill since June.   2) HYPERTENSION:  REVIEWED ER VISIT FOR HTN URGENCY IN MAY  3) T2 DM: w/ neuropathy   TAKING METFORMIN AND [place].  Fasting 120  to 130  post prandial  160.  No lows. Going for a walks daily   . Neuropathy still limiting his standing despite current use of gabapentin   4) S/P SHOULDER SURGERY  RIGHT  :  FINISHED THE  PT AT [facility]   STILL WEAK , ROM NOT BAD .    Outpatient Medications Prior to Visit  Medication Sig Dispense Refill   amLODipine (NORVASC) 5 MG tablet Take 1 tablet (5 mg total) by mouth daily. 90 tablet 1   ARIPiprazole (ABILIFY) 5 MG tablet Take one tab at bed time 90 tablet 0   aspirin EC 81 MG tablet Take 1 tablet (81 mg total) by mouth daily at 6 PM. Swallow whole. 30 tablet 12   atorvastatin (LIPITOR) 80 MG tablet Take 1 tablet (80  mg total) by mouth daily. 180 tablet 3   Blood Glucose Monitoring Suppl (ACCU-CHEK AVIVA PLUS) w/Device KIT Use to check blood sugars up to 4 times daily. ICD-10 E11.40 1 kit 0   buPROPion (WELLBUTRIN XL) 300 MG 24 hr tablet Take 1 tablet (300 mg total) by mouth daily. 90 tablet 0   carvedilol (COREG) 12.5 MG tablet Take 1 tablet (12.5 mg total) by mouth 2 (two) times daily. 180 tablet 3   celecoxib (CELEBREX) 100 MG capsule Take 1 capsule by mouth twice daily 60 capsule 0   dicyclomine (BENTYL) 20 MG tablet TAKE 1 TABLET BY MOUTH EVERY 6 HOURS 120 tablet 0   docusate sodium (COLACE) 100 MG capsule Take 1 capsule (100 mg total) by mouth 2 (two) times daily. 10 capsule 0   doxycycline (VIBRA-TABS) 100 MG tablet Take 1 tablet (100 mg total) by mouth every 12 (twelve) hours. 14 tablet 0   empagliflozin (JARDIANCE) 10 MG TABS tablet Take 10 mg by mouth daily.     ezetimibe (ZETIA) 10 MG tablet Take 1 tablet (10 mg total) by mouth daily. 90 tablet 3   folic acid (FOLVITE) 1 MG tablet Take 1 tablet (1 mg total) by mouth daily. 90 tablet 0   furosemide (LASIX) 20 MG tablet Take 1 tablet (20 mg total) by mouth 2 (two) times a week. 4520 tablet 3   glucose blood (  ACCU-CHEK AVIVA PLUS) test strip Use to check blood sugars up to 4 times daily. ICD-10 E11.40 400 each 0   hydrALAZINE (APRESOLINE) 25 MG tablet Take 1 tablet (25 mg total) by mouth 3 (three) times daily. (Patient taking differently: Take 25 mg by mouth 3 (three) times daily. FOR ELEVATIONS   IN BETWEEN SCHEDULED MEDS   the dose of  25 MG CAN BE TAKEN  for readings > 150/90) 90 tablet 2   isosorbide mononitrate (IMDUR) 30 MG 24 hr tablet Take 1 tablet (30 mg total) by mouth daily. 90 tablet 3   Lancets (ACCU-CHEK MULTICLIX) lancets Use to to check blood sugars up to 4 times daily. ICD-10: E11.40 400 each 0   losartan (COZAAR) 100 MG tablet Take 1 tablet by mouth once daily 90 tablet 3   methocarbamol (ROBAXIN) 500 MG tablet Take 1 tablet (500 mg  total) by mouth every 8 (eight) hours as needed. 90 tablet 2   Multiple Vitamin (MULTIVITAMIN WITH MINERALS) TABS tablet Take 1 tablet by mouth daily.     naloxone (NARCAN) nasal spray 4 mg/0.1 mL Use as needed for excessive sedation /opioid overdose 1 each 1   omeprazole (PRILOSEC) 40 MG capsule TAKE 1 CAPSULE BY MOUTH IN THE MORNING AND 1 CAPSULE IN THE EVENING 180 capsule 3   promethazine (PHENERGAN) 12.5 MG tablet Take 1 tablet (12.5 mg total) by mouth every 4 (four) hours as needed for nausea or vomiting. (Patient taking differently: Take 12.5 mg by mouth every 8 (eight) hours as needed for nausea or vomiting.) 20 tablet 0   traZODone (DESYREL) 100 MG tablet TAKE 1 & 1/2 (ONE & ONE-HALF) TABLETS BY MOUTH AT BEDTIME 135 tablet 2   busPIRone (BUSPAR) 15 MG tablet Take 1 tablet (15 mg total) by mouth 3 (three) times daily. 270 tablet 1   clonazePAM (KLONOPIN) 2 MG tablet TAKE 1/2 (ONE-HALF) TABLET BY MOUTH TWICE DAILY AS NEEDED FOR ANXIETY 30 tablet 2   gabapentin (NEURONTIN) 300 MG capsule TAKE 3 CAPSULES BY MOUTH TWICE DAILY (AM  AND  PM)  AND  2  CAPS  NIGHTLY 240 capsule 0   gabapentin (NEURONTIN) 600 MG tablet TAKE 1 TABLET BY MOUTH THREE TIMES DAILY 270 tablet 2   levothyroxine (SYNTHROID) 88 MCG tablet Take 1 tablet (88 mcg total) by mouth daily before breakfast. 90 tablet 1   metFORMIN (GLUCOPHAGE) 500 MG tablet TAKE 1 TABLET BY MOUTH TWICE DAILY WITH A MEAL 180 tablet 1   No facility-administered medications prior to visit.    Review of Systems;  Patient denies headache, fevers, malaise, unintentional weight loss, skin rash, eye pain, sinus congestion and sinus pain, sore throat, dysphagia,  hemoptysis , cough, dyspnea, wheezing, chest pain, palpitations, orthopnea, edema, abdominal pain, nausea, melena, diarrhea, constipation, flank pain, dysuria, hematuria, urinary  Frequency, nocturia, numbness, tingling, seizures,  Focal weakness, Loss of consciousness,  Tremor, insomnia, depression,  anxiety, and suicidal ideation.      Objective:  BP 124/66   Pulse 73   Temp 98 F (36.7 C) (Oral)   Ht 6' (1.829 m)   Wt 240 lb 3.2 oz (109 kg)   SpO2 96%   BMI 32.58 kg/m   BP Readings from Last 3 Encounters:  11/30/22 124/66  09/13/22 (!) 190/92  09/11/22 (!) 162/99    Wt Readings from Last 3 Encounters:  11/30/22 240 lb 3.2 oz (109 kg)  09/13/22 225 lb 9.6 oz (102.3 kg)  09/11/22 231 lb (104.8 kg)  Physical Exam Vitals reviewed.  Constitutional:      General: He is not in acute distress.    Appearance: Normal appearance. He is normal weight. He is not ill-appearing, toxic-appearing or diaphoretic.  HENT:     Head: Normocephalic.  Eyes:     General: No scleral icterus.       Right eye: No discharge.        Left eye: No discharge.     Conjunctiva/sclera: Conjunctivae normal.  Cardiovascular:     Rate and Rhythm: Normal rate and regular rhythm.     Heart sounds: Normal heart sounds.  Pulmonary:     Effort: Pulmonary effort is normal. No respiratory distress.     Breath sounds: Normal breath sounds.  Musculoskeletal:        General: Normal range of motion.     Cervical back: Normal range of motion.  Skin:    General: Skin is warm and dry.  Neurological:     General: No focal deficit present.     Mental Status: He is alert and oriented to person, place, and time. Mental status is at baseline.  Psychiatric:        Mood and Affect: Mood normal.        Behavior: Behavior normal.        Thought Content: Thought content normal.        Judgment: Judgment normal.     Lab Results  Component Value Date   HGBA1C 7.6 (H) 11/30/2022   HGBA1C 7.1 (H) 07/26/2022   HGBA1C 6.4 04/19/2022    Lab Results  Component Value Date   CREATININE 1.12 11/30/2022   CREATININE 1.02 09/11/2022   CREATININE 0.89 08/16/2022    Lab Results  Component Value Date   WBC 6.4 09/11/2022   HGB 13.6 09/11/2022   HCT 41.3 09/11/2022   PLT 268 09/11/2022   GLUCOSE 119 (H)  11/30/2022   CHOL 124 11/30/2022   TRIG 325.0 (H) 11/30/2022   HDL 30.60 (L) 11/30/2022   LDLDIRECT 79.0 11/30/2022   LDLCALC 55 04/19/2022   ALT 25 11/30/2022   AST 23 11/30/2022   NA 138 11/30/2022   K 5.0 11/30/2022   CL 102 11/30/2022   CREATININE 1.12 11/30/2022   BUN 17 11/30/2022   CO2 31 11/30/2022   TSH 2.16 07/26/2022   PSA 0.70 10/16/2017   INR 0.98 08/27/2015   HGBA1C 7.6 (H) 11/30/2022   MICROALBUR <0.7 04/19/2022    CT Head Wo Contrast  Result Date: 09/11/2022 CLINICAL DATA:  Headaches EXAM: CT HEAD WITHOUT CONTRAST TECHNIQUE: Contiguous axial images were obtained from the base of the skull through the vertex without intravenous contrast. RADIATION DOSE REDUCTION: This exam was performed according to the departmental dose-optimization program which includes automated exposure control, adjustment of the mA and/or kV according to patient size and/or use of iterative reconstruction technique. COMPARISON:  09/24/2017 FINDINGS: Brain: No acute intracranial findings are seen. There are no signs of bleeding within the cranium. Ventricles are unremarkable. Cortical sulci are prominent. Vascular: Unremarkable. Skull: No acute findings are seen. Sinuses/Orbits: Unremarkable. Other: None. IMPRESSION: No acute intracranial findings are seen in noncontrast CT brain. Electronically Signed   By: Ernie Avena M.D.   On: 09/11/2022 11:27   DG Chest 2 View  Result Date: 09/11/2022 CLINICAL DATA:  57 year old male with hypertension. EXAM: CHEST - 2 VIEW COMPARISON:  Chest radiographs 08/12/2022 and earlier. FINDINGS: PA and lateral views at 1046 hours. Right shoulder arthroplasty redemonstrated, new from last  year. Lung volumes and mediastinal contours are normal. Visualized tracheal air column is within normal limits. Both lungs appear clear. No pneumothorax or pleural effusion. No acute osseous abnormality identified. Negative visible bowel gas. IMPRESSION: Negative, no cardiopulmonary  abnormality. Electronically Signed   By: Odessa Fleming M.D.   On: 09/11/2022 11:02    Assessment & Plan:  .Essential hypertension Assessment & Plan: mproved control on carvedilol 12.5 mg bid , imdur, losartan  and amlodipine 5 mg daily   Lab Results  Component Value Date   CREATININE 1.12 11/30/2022   Lab Results  Component Value Date   NA 138 11/30/2022   K 5.0 11/30/2022   CL 102 11/30/2022   CO2 31 11/30/2022     Orders: -     Comprehensive metabolic panel  GAD (generalized anxiety disorder) -     busPIRone HCl; Take 1 tablet (15 mg total) by mouth 3 (three) times daily.  Dispense: 270 tablet; Refill: 1  MDD (major depressive disorder), recurrent episode, moderate (HCC) -     busPIRone HCl; Take 1 tablet (15 mg total) by mouth 3 (three) times daily.  Dispense: 270 tablet; Refill: 1  Hyperlipidemia associated with type 2 diabetes mellitus (HCC) -     Lipid panel -     LDL cholesterol, direct  Type 2 diabetes mellitus with diabetic neuropathy, without long-term current use of insulin (HCC) -     Hemoglobin A1c -     Comprehensive metabolic panel  Chronic pain associated with significant psychosocial dysfunction Assessment & Plan: He requires management of pain involving both knees,   Cervical spine and shoulders with hydrocodone, which was  last filled on Feb 3.  A refill for April is on file at his pharmacy  he has been prescribed oxycodone IR 5 mg  post operatively following his reverse shoulder replacement April 16.  Last rx from orthopedics was May 3 for #20.    Refill history confirmed via [place] Controlled Substance database, accessed by me today..last Vicodin refill was June 6 for #60 (from me)UDS ORDERED TODAY  Orders: -     Urine drugs of abuse scrn w alc, routine (Ref Lab)  Anxiety state Assessment & Plan: Aggravated by his mother's current state /diagnosis of dementia and increased responsibilities i His anxiety is managed with scheduled clonazepam, buspirone And  trazodone 100 mg qhs.  Will encourage him to consider actively trying to reduce his dependance on  Clonazepam.  Naloxone prescription sent to [facility] given concurrent use of hydrocodone Refill history confirmed via [place] Controlled Substance databas, accessed by me today..    DVT, lower extremity, distal, acute, left (HCC)  Chronic pain of both knees Assessment & Plan: Bilateral,  With history of bilateral knee replacement  (left in 2022).     Diabetes mellitus treated with oral medication [facility]) Assessment & Plan: Managed with Jardiance and  metformin .  He has lost control of his diabetes. Adding glipizide.  . Taking  gabapentin at the maximal dose (900 qam and q pm  600 mg qhs) A1c and lipids are at goal.    Lab Results  Component Value Date   HGBA1C 7.6 (H) 11/30/2022   Lab Results  Component Value Date   CHOL 124 11/30/2022   HDL 30.60 (L) 11/30/2022   LDLCALC 55 04/19/2022   LDLDIRECT 79.0 11/30/2022   TRIG 325.0 (H) 11/30/2022   CHOLHDL 4 11/30/2022       History of deep venous thrombosis (DVT) of distal vein  of left lower extremity  Other orders -     Levothyroxine Sodium; Take 1 tablet (88 mcg total) by mouth daily before breakfast.  Dispense: 90 tablet; Refill: 1 -     metFORMIN HCl; Take 1 tablet (500 mg total) by mouth 2 (two) times daily with a meal.  Dispense: 180 tablet; Refill: 1 -     Gabapentin; Take 3 capsules (900 mg total) by mouth 2 (two) times daily. TAKE 3 CAPSULES BY MOUTH TWICE DAILY (AM  AND  PM)  AND  2  CAPS  NIGHTLY  Dispense: 540 capsule; Refill: 0 -     HYDROcodone-Acetaminophen; Take 1 tablet by mouth 2 (two) times daily as needed.  Dispense: 60 tablet; Refill: 0 -     clonazePAM; Take 1 tablet (1 mg total) by mouth 2 (two) times daily as needed for anxiety.  Dispense: 60 tablet; Refill: 2 -     glipiZIDE; Take 2.5 mg by mouth 2 (two) times daily before a meal.  Dispense: 60 tablet; Refill: 2     I provided 30 minutes of face-to-face time  during this encounter reviewing patient's last visit with me, patient's  most recent visit with cardiology,  nephrology,  and neurology,  recent surgical and non surgical procedures, previous  labs and imaging studies, counseling on currently addressed issues,  and post visit ordering to diagnostics and therapeutics .   Follow-up: Return for chronic pain management.   [name], MD

## 2022-11-30 NOTE — Assessment & Plan Note (Signed)
He requires management of pain involving both knees,   Cervical spine and shoulders with hydrocodone, which was  last filled on Feb 3.  A refill for April is on file at his pharmacy  he has been prescribed oxycodone IR 5 mg  post operatively following his reverse shoulder replacement April 16.  Last rx from orthopedics was May 3 for #20.    Refill history confirmed via [place] Controlled Substance database, accessed by me today..last Vicodin refill was June 6 for #60 (from me)UDS ORDERED TODAY

## 2022-11-30 NOTE — Patient Instructions (Signed)
I am so sorry that you lost your mother.  Please use the [facility]osp[facility] grief counselling if you need it   I have increased the gabapentin rx to 6 capsules daily (you can divide them however you think it helps your neuropathy)  I am refilling the hydrocodone and clonazepam today  Try to reduce your clonazepam dose once in a while  by a 1/2 tablet  if you can tolerate it

## 2022-12-01 MED ORDER — GLIPIZIDE 2.5 MG PO TABS: 2.5000 mg | ORAL_TABLET | Freq: Two times a day (BID) | ORAL | 2 refills | Status: DC

## 2022-12-01 NOTE — Assessment & Plan Note (Signed)
Managed with Jardiance and  metformin .  He has lost control of his diabetes. Adding glipizide.  . Taking  gabapentin at the maximal dose (900 qam and q pm  600 mg qhs) A1c and lipids are at goal.    Lab Results  Component Value Date   HGBA1C 7.6 (H) 11/30/2022   Lab Results  Component Value Date   CHOL 124 11/30/2022   HDL 30.60 (L) 11/30/2022   LDLCALC 55 04/19/2022   LDLDIRECT 79.0 11/30/2022   TRIG 325.0 (H) 11/30/2022   CHOLHDL 4 11/30/2022

## 2022-12-01 NOTE — Assessment & Plan Note (Signed)
mproved control on carvedilol 12.5 mg bid , imdur, losartan  and amlodipine 5 mg daily   Lab Results  Component Value Date   CREATININE 1.12 11/30/2022   Lab Results  Component Value Date   NA 138 11/30/2022   K 5.0 11/30/2022   CL 102 11/30/2022   CO2 31 11/30/2022

## 2022-12-01 NOTE — Assessment & Plan Note (Deleted)
Has has  Completed over 6 months of anticoagulation.

## 2022-12-01 NOTE — Assessment & Plan Note (Signed)
Bilateral,  With history of bilateral knee replacement  (left in 2022).

## 2022-12-02 ENCOUNTER — Other Ambulatory Visit: Payer: Self-pay | Admitting: Internal Medicine

## 2022-12-03 ENCOUNTER — Other Ambulatory Visit: Payer: Self-pay | Admitting: Internal Medicine

## 2022-12-06 ENCOUNTER — Other Ambulatory Visit: Payer: Self-pay | Admitting: Surgical

## 2022-12-06 ENCOUNTER — Other Ambulatory Visit: Payer: Self-pay | Admitting: Internal Medicine

## 2022-12-07 ENCOUNTER — Other Ambulatory Visit: Payer: Self-pay | Admitting: Internal Medicine

## 2022-12-25 ENCOUNTER — Other Ambulatory Visit: Payer: Self-pay | Admitting: Internal Medicine

## 2023-01-04 ENCOUNTER — Other Ambulatory Visit (HOSPITAL_COMMUNITY): Payer: Self-pay | Admitting: Psychiatry

## 2023-01-04 ENCOUNTER — Other Ambulatory Visit: Payer: Self-pay | Admitting: Internal Medicine

## 2023-01-04 ENCOUNTER — Other Ambulatory Visit: Payer: Self-pay | Admitting: Surgical

## 2023-01-04 DIAGNOSIS — F331 Major depressive disorder, recurrent, moderate: Secondary | ICD-10-CM

## 2023-01-05 ENCOUNTER — Telehealth (HOSPITAL_BASED_OUTPATIENT_CLINIC_OR_DEPARTMENT_OTHER): Payer: Medicare HMO | Admitting: Psychiatry

## 2023-01-05 ENCOUNTER — Encounter (HOSPITAL_COMMUNITY): Payer: Self-pay | Admitting: Psychiatry

## 2023-01-05 VITALS — Wt 240.0 lb

## 2023-01-05 DIAGNOSIS — F419 Anxiety disorder, unspecified: Secondary | ICD-10-CM

## 2023-01-05 DIAGNOSIS — F331 Major depressive disorder, recurrent, moderate: Secondary | ICD-10-CM | POA: Diagnosis not present

## 2023-01-05 DIAGNOSIS — F4321 Adjustment disorder with depressed mood: Secondary | ICD-10-CM | POA: Diagnosis not present

## 2023-01-05 MED ORDER — ARIPIPRAZOLE 5 MG PO TABS
ORAL_TABLET | ORAL | 0 refills | Status: DC
Start: 2023-01-05 — End: 2023-04-06

## 2023-01-05 MED ORDER — BUPROPION HCL ER (XL) 300 MG PO TB24
300.0000 mg | ORAL_TABLET | Freq: Every day | ORAL | 0 refills | Status: DC
Start: 2023-01-05 — End: 2023-04-06

## 2023-01-05 NOTE — Progress Notes (Signed)
[facility] MD Virtual Progress Note   Patient Location: Home Provider Location: Home Office  I connect with patient by video and verified that I am speaking with correct person by using two identifiers. I discussed the limitations of evaluation and management by telemedicine and the availability of in person appointments. I also discussed with the patient that there may be a patient responsible charge related to this service. The patient expressed understanding and agreed to proceed.  [name] [id number] 57 y.o.  01/05/2023 11:00 AM  History of Present Illness:  Patient is evaluated by video session.  He reported recently lost his 70 year old mother in July who had dementia.  Patient feels sad and there are some nights when he had trouble sleeping.  He is still waiting for the toxic report of her daughter who found that about the 6 months ago.  Patient continues to do grief counseling through the church.  He reported things are okay so far.  He is very close to his son.  He denies any crying spells or any suicidal thoughts.  He admitted weight gain in past 2 months.  His physician recently increased the gabapentin because he was having a lot of numbness and pain.  His hemoglobin A1c is 7.6 with slightly jumped from 6.1.  Denies any feeling of hopelessness or worthlessness.  He does walk on a regular basis.  He is taking Klonopin, BuSpar and trazodone from primary care.  He also taking gabapentin.  We are also giving him Abilify and Wellbutrin.  Patient reluctant to cut down the medication since he feels they are keeping him stable.  He is also now muscle relaxant and pain medicine.  He denies any aggression, violence, mania.  Denies drinking or using any illegal substances.  Past Psychiatric History: H/O depression and anxiety.  No h/o suicidal attempt or inpatient treatment.  Seen Dr. Edwin Dada and given Zoloft, Klonopin and Cymbalta.  Later PCP added BuSpar and trazodone.  We  tried Lamictal but did not help.     Outpatient Encounter Medications as of 01/05/2023  Medication Sig   amLODipine (NORVASC) 5 MG tablet Take 1 tablet (5 mg total) by mouth daily.   ARIPiprazole (ABILIFY) 5 MG tablet Take one tab at bed time   aspirin EC 81 MG tablet Take 1 tablet (81 mg total) by mouth daily at 6 PM. Swallow whole.   atorvastatin (LIPITOR) 80 MG tablet Take 1 tablet (80 mg total) by mouth daily.   Blood Glucose Monitoring Suppl (ACCU-CHEK AVIVA PLUS) w/Device KIT Use to check blood sugars up to 4 times daily. ICD-10 E11.40   buPROPion (WELLBUTRIN XL) 300 MG 24 hr tablet Take 1 tablet (300 mg total) by mouth daily.   busPIRone (BUSPAR) 15 MG tablet Take 1 tablet (15 mg total) by mouth 3 (three) times daily.   carvedilol (COREG) 12.5 MG tablet Take 1 tablet (12.5 mg total) by mouth 2 (two) times daily.   celecoxib (CELEBREX) 100 MG capsule Take 1 capsule by mouth twice daily   clonazePAM (KLONOPIN) 1 MG tablet Take 1 tablet (1 mg total) by mouth 2 (two) times daily as needed for anxiety.   dicyclomine (BENTYL) 20 MG tablet TAKE 1 TABLET BY MOUTH EVERY 6 HOURS   docusate sodium (COLACE) 100 MG capsule Take 1 capsule (100 mg total) by mouth 2 (two) times daily.   doxycycline (VIBRA-TABS) 100 MG tablet Take 1 tablet (100 mg total) by mouth every 12 (twelve) hours.   empagliflozin (JARDIANCE)  10 MG TABS tablet Take 10 mg by mouth daily.   ezetimibe (ZETIA) 10 MG tablet Take 1 tablet (10 mg total) by mouth daily.   folic acid (FOLVITE) 1 MG tablet Take 1 tablet (1 mg total) by mouth daily.   furosemide (LASIX) 20 MG tablet TAKE 1 TABLET BY MOUTH TWICE A WEEK   gabapentin (NEURONTIN) 300 MG capsule TAKE 3 CAPSULES BY MOUTH TWICE DAILY IN THE MORNING AND EVENING AND 2 CAPSULES NIGHTLY   glipiZIDE 2.5 MG TABS Take 2.5 mg by mouth 2 (two) times daily before a meal.   glucose blood (ACCU-CHEK AVIVA PLUS) test strip Use to check blood sugars up to 4 times daily. ICD-10 E11.40    hydrALAZINE (APRESOLINE) 25 MG tablet TAKE 1 TABLET BY MOUTH THREE TIMES DAILY   HYDROcodone-acetaminophen (NORCO) 10-325 MG tablet Take 1 tablet by mouth 2 (two) times daily as needed.   isosorbide mononitrate (IMDUR) 30 MG 24 hr tablet Take 1 tablet (30 mg total) by mouth daily.   Lancets (ACCU-CHEK MULTICLIX) lancets Use to to check blood sugars up to 4 times daily. ICD-10: E11.40   levothyroxine (SYNTHROID) 88 MCG tablet Take 1 tablet (88 mcg total) by mouth daily before breakfast.   losartan (COZAAR) 100 MG tablet Take 1 tablet by mouth once daily   metFORMIN (GLUCOPHAGE) 500 MG tablet Take 1 tablet (500 mg total) by mouth 2 (two) times daily with a meal.   methocarbamol (ROBAXIN) 500 MG tablet Take 1 tablet (500 mg total) by mouth every 8 (eight) hours as needed.   Multiple Vitamin (MULTIVITAMIN WITH MINERALS) TABS tablet Take 1 tablet by mouth daily.   naloxone (NARCAN) nasal spray 4 mg/0.1 mL Use as needed for excessive sedation /opioid overdose   omeprazole (PRILOSEC) 40 MG capsule TAKE 1 CAPSULE BY MOUTH IN THE MORNING AND 1 CAPSULE IN THE EVENING   promethazine (PHENERGAN) 12.5 MG tablet Take 1 tablet (12.5 mg total) by mouth every 4 (four) hours as needed for nausea or vomiting. (Patient taking differently: Take 12.5 mg by mouth every 8 (eight) hours as needed for nausea or vomiting.)   traZODone (DESYREL) 100 MG tablet TAKE 1 & 1/2 (ONE & ONE-HALF) TABLETS BY MOUTH AT BEDTIME   No facility-administered encounter medications on file as of 01/05/2023.    Recent Results (from the past 2160 hour(s))  HM DIABETES EYE EXAM     Status: None   Collection Time: 11/28/22  9:05 AM  Result Value Ref Range   HM Diabetic Eye Exam No Retinopathy No Retinopathy    Comment: Abstracted by HIM  HgB A1c     Status: Abnormal   Collection Time: 11/30/22 10:46 AM  Result Value Ref Range   Hgb A1c MFr Bld 7.6 (H) 4.6 - 6.5 %    Comment: Glycemic Control Guidelines for People with Diabetes:Non  Diabetic:  <6%Goal of Therapy: <7%Additional Action Suggested:  >8%   Comp Met (CMET)     Status: Abnormal   Collection Time: 11/30/22 10:46 AM  Result Value Ref Range   Sodium 138 135 - 145 mEq/L   Potassium 5.0 3.5 - 5.1 mEq/L   Chloride 102 96 - 112 mEq/L   CO2 31 19 - 32 mEq/L   Glucose, Bld 119 (H) 70 - 99 mg/dL   BUN 17 6 - 23 mg/dL   Creatinine, Ser 2.44 0.40 - 1.50 mg/dL   Total Bilirubin 0.4 0.2 - 1.2 mg/dL   Alkaline Phosphatase 111 39 - 117 U/L  AST 23 0 - 37 U/L   ALT 25 0 - 53 U/L   Total Protein 6.4 6.0 - 8.3 g/dL   Albumin 4.2 3.5 - 5.2 g/dL   GFR 86.57 >84.69 mL/min    Comment: Calculated using the CKD-EPI Creatinine Equation (2021)   Calcium 9.3 8.4 - 10.5 mg/dL  Lipid Profile     Status: Abnormal   Collection Time: 11/30/22 10:46 AM  Result Value Ref Range   Cholesterol 124 0 - 200 mg/dL    Comment: ATP III Classification       Desirable:  < 200 mg/dL               Borderline High:  200 - 239 mg/dL          High:  > = 629 mg/dL   Triglycerides 528.4 (H) 0.0 - 149.0 mg/dL    Comment: Normal:  <132 mg/dLBorderline High:  150 - 199 mg/dL   HDL 44.01 (L) >02.72 mg/dL   VLDL 53.6 (H) 0.0 - 64.4 mg/dL   Total CHOL/HDL Ratio 4     Comment:                Men          Women1/2 Average Risk     3.4          3.3Average Risk          5.0          4.42X Average Risk          9.6          7.13X Average Risk          15.0          11.0                       NonHDL 93.25     Comment: NOTE:  Non-HDL goal should be 30 mg/dL higher than patient's LDL goal (i.e. LDL goal of < 70 mg/dL, would have non-HDL goal of < 100 mg/dL)  Direct LDL     Status: None   Collection Time: 11/30/22 10:46 AM  Result Value Ref Range   Direct LDL 79.0 mg/dL    Comment: Optimal:  <034 mg/dLNear or Above Optimal:  100-129 mg/dLBorderline High:  130-159 mg/dLHigh:  160-189 mg/dLVery High:  >190 mg/dL  Urine drugs of abuse scrn w alc, routine ([facility] [place] [facility])     Status: None    Collection Time: 11/30/22 10:46 AM  Result Value Ref Range   Amphetamines, Urine Negative Cutoff=1000 ng/mL    Comment: Amphetamine test includes Amphetamine and Methamphetamine.   Barbiturate Quant, Ur Negative Cutoff=300 ng/mL   Benzodiazepine Quant, Ur Negative Cutoff=300 ng/mL   Cannabinoid Quant, Ur Negative Cutoff=50 ng/mL   Cocaine (Metab.) Negative Cutoff=300 ng/mL   Opiate Quant, Ur See Final Results Cutoff=300 ng/mL    Comment: Opiate test includes Codeine and Morphine only.   PCP Quant, Ur Negative Cutoff=25 ng/mL   Methadone Screen, Urine Negative Cutoff=300 ng/mL   Propoxyphene Negative Cutoff=300 ng/mL   Ethanol, Urine Negative Cutoff=0.020 %  Opiates Confirmation, Urine     Status: None   Collection Time: 11/30/22 10:46 AM  Result Value Ref Range   OPIATES Negative Cutoff=300    Comment: Opiate test includes Codeine and Morphine only.     Psychiatric Specialty Exam: Physical Exam  Review of Systems  Weight 240 lb (108.9 kg).There is no height or weight on  file to calculate BMI.  General Appearance: Fairly Groomed  Eye Contact:  Good  Speech:  Slow  Volume:  Decreased  Mood:  Dysphoric  Affect:  Congruent  Thought Process:  Descriptions of Associations: Intact  Orientation:  Full (Time, Place, and Person)  Thought Content:  Rumination  Suicidal Thoughts:  No  Homicidal Thoughts:  No  Memory:  Immediate;   Good Recent;   Good Remote;   Fair  Judgement:  Intact  Insight:  Present  Psychomotor Activity:  Decreased  Concentration:  Concentration: Good and Attention Span: Good  Recall:  Good  Fund of Knowledge:  Good  Language:  Good  Akathisia:  No  Handed:  Right  AIMS (if indicated):     Assets:  Communication Skills Desire for Improvement Housing Social Support Transportation  ADL's:  Intact  Cognition:  WNL  Sleep:  fair     Assessment/Plan: MDD (major depressive disorder), recurrent episode, moderate (HCC) - Plan: buPROPion (WELLBUTRIN XL)  300 MG 24 hr tablet, ARIPiprazole (ABILIFY) 5 MG tablet  Anxiety - Plan: buPROPion (WELLBUTRIN XL) 300 MG 24 hr tablet  Grief  Condolence given as recently lost his 71 year old mother who has dementia.  Patient also had lost daughter 6 months ago after she found getting better.  He admitted to struggle with losses but grief counseling for the chart has been helpful.  We discussed about weight gain in the past 2 months.  He recently increased the gabapentin by his PCP to help his neuropathy pain.  We talk about weight gain as patient also had diabetes and there is a modular increase of hemoglobin A1c.  I recommend should discuss with his PCP if gabapentin causing the weight gain.  Other possibility is the Abilify however he has been on Abilify for a while and that has been helpful for his depression.  He has no major panic attack.  He is taking multiple medication for multiple health needs and we discussed polypharmacy.  Patient agreed to talk to his PCP about gabapentin.  I reviewed blood work results and discussed with him.  We will continue Abilify 5 mg daily and XL 300 mg daily.  Like to continue trazodone, Klonopin and BuSpar from his primary care.  Encourage to continue walking, watching his calorie intake.  We will follow-up in 3 months.   Follow Up Instructions:     I discussed the assessment and treatment plan with the patient. The patient was provided an opportunity to ask questions and all were answered. The patient agreed with the plan and demonstrated an understanding of the instructions.   The patient was advised to call back or seek an in-person evaluation if the symptoms worsen or if the condition fails to improve as anticipated.    Collaboration of Care: Other provider involved in patient's care AEB notes are available in epic to review  Patient/Guardian was advised Release of Information must be obtained prior to any record release in order to collaborate their care with an  outside provider. Patient/Guardian was advised if they have not already done so to contact the registration department to sign all necessary forms in order for [place] to release information regarding their care.   Consent: Patient/Guardian gives verbal consent for treatment and assignment of benefits for services provided during this visit. Patient/Guardian expressed understanding and agreed to proceed.     I provided 28 minutes of non face to face time during this encounter.  Note: This document was prepared by [employer] voice  dictation technology and any errors that results from this process are unintentional.    Cleotis Nipper, MD 01/05/2023

## 2023-01-13 ENCOUNTER — Other Ambulatory Visit: Payer: Self-pay | Admitting: Internal Medicine

## 2023-01-13 DIAGNOSIS — F411 Generalized anxiety disorder: Secondary | ICD-10-CM

## 2023-01-13 DIAGNOSIS — F331 Major depressive disorder, recurrent, moderate: Secondary | ICD-10-CM

## 2023-01-16 ENCOUNTER — Ambulatory Visit (INDEPENDENT_AMBULATORY_CARE_PROVIDER_SITE_OTHER): Payer: Medicare HMO

## 2023-01-16 ENCOUNTER — Other Ambulatory Visit: Payer: Self-pay | Admitting: Family Medicine

## 2023-01-16 ENCOUNTER — Ambulatory Visit (INDEPENDENT_AMBULATORY_CARE_PROVIDER_SITE_OTHER): Payer: Medicare HMO | Admitting: Family Medicine

## 2023-01-16 ENCOUNTER — Encounter: Payer: Self-pay | Admitting: Family Medicine

## 2023-01-16 VITALS — BP 126/74 | HR 81 | Temp 98.5°F | Ht 72.0 in | Wt 241.8 lb

## 2023-01-16 DIAGNOSIS — R051 Acute cough: Secondary | ICD-10-CM

## 2023-01-16 DIAGNOSIS — R062 Wheezing: Secondary | ICD-10-CM | POA: Diagnosis not present

## 2023-01-16 DIAGNOSIS — J189 Pneumonia, unspecified organism: Secondary | ICD-10-CM

## 2023-01-16 DIAGNOSIS — R918 Other nonspecific abnormal finding of lung field: Secondary | ICD-10-CM | POA: Diagnosis not present

## 2023-01-16 DIAGNOSIS — R059 Cough, unspecified: Secondary | ICD-10-CM | POA: Insufficient documentation

## 2023-01-16 DIAGNOSIS — R0602 Shortness of breath: Secondary | ICD-10-CM | POA: Diagnosis not present

## 2023-01-16 DIAGNOSIS — Z96611 Presence of right artificial shoulder joint: Secondary | ICD-10-CM | POA: Diagnosis not present

## 2023-01-16 LAB — POC COVID19 BINAXNOW: SARS Coronavirus 2 Ag: NEGATIVE

## 2023-01-16 MED ORDER — ALBUTEROL SULFATE HFA 108 (90 BASE) MCG/ACT IN AERS: 2.0000 | INHALATION_SPRAY | Freq: Four times a day (QID) | RESPIRATORY_TRACT | 0 refills | Status: DC | PRN

## 2023-01-16 MED ORDER — BENZONATATE 200 MG PO CAPS: 200.0000 mg | ORAL_CAPSULE | Freq: Two times a day (BID) | ORAL | 0 refills | Status: DC | PRN

## 2023-01-16 MED ORDER — AZITHROMYCIN 250 MG PO TABS: ORAL_TABLET | ORAL | 0 refills | Status: AC

## 2023-01-16 MED ORDER — AMOXICILLIN-POT CLAVULANATE 875-125 MG PO TABS: 1.0000 | ORAL_TABLET | Freq: Two times a day (BID) | ORAL | 0 refills | Status: DC

## 2023-01-16 MED ORDER — PREDNISONE 20 MG PO TABS: 40.0000 mg | ORAL_TABLET | Freq: Every day | ORAL | 0 refills | Status: DC

## 2023-01-16 NOTE — Addendum Note (Signed)
Addended by: Glori Luis on: 01/16/2023 10:17 AM   Modules accepted: Orders

## 2023-01-16 NOTE — Progress Notes (Addendum)
Marikay Alar, MD Phone: [phone]  [name] is a 57 y.o. male who presents today for same day visit.   Congestion: Patient notes onset of symptoms on 9/21.  Notes sinus and chest congestion.  He is coughing up greenish mucus.  He has postnasal drip at night.  No sore throat, taste disturbance, or smell disturbance.  He does have some shortness of breath when moving.  He had a negative COVID test on Sunday.  No COVID exposures.  No sick exposures.  Has been taking Mucinex D and emergenC.  Social History   Tobacco Use  Smoking Status Former   Current packs/day: 0.00   Average packs/day: 2.0 packs/day for 18.0 years (36.0 ttl pk-yrs)   Types: Cigarettes   Start date: 04/25/1987   Quit date: 04/24/2005   Years since quitting: 17.7  Smokeless Tobacco Never  Tobacco Comments   quit 20 years ago     Current Outpatient Medications on File Prior to Visit  Medication Sig Dispense Refill   amLODipine (NORVASC) 5 MG tablet Take 1 tablet (5 mg total) by mouth daily. 90 tablet 1   ARIPiprazole (ABILIFY) 5 MG tablet Take one tab at bed time 90 tablet 0   aspirin EC 81 MG tablet Take 1 tablet (81 mg total) by mouth daily at 6 PM. Swallow whole. 30 tablet 12   atorvastatin (LIPITOR) 80 MG tablet Take 1 tablet (80 mg total) by mouth daily. 180 tablet 3   Blood Glucose Monitoring Suppl (ACCU-CHEK AVIVA PLUS) w/Device KIT Use to check blood sugars up to 4 times daily. ICD-10 E11.40 1 kit 0   buPROPion (WELLBUTRIN XL) 300 MG 24 hr tablet Take 1 tablet (300 mg total) by mouth daily. 90 tablet 0   busPIRone (BUSPAR) 15 MG tablet Take 1 tablet (15 mg total) by mouth 3 (three) times daily. 270 tablet 1   carvedilol (COREG) 12.5 MG tablet Take 1 tablet (12.5 mg total) by mouth 2 (two) times daily. 180 tablet 3   celecoxib (CELEBREX) 100 MG capsule Take 1 capsule by mouth twice daily 60 capsule 0   clonazePAM (KLONOPIN) 1 MG tablet Take 1 tablet (1 mg total) by mouth 2 (two) times daily as needed  for anxiety. 60 tablet 2   dicyclomine (BENTYL) 20 MG tablet TAKE 1 TABLET BY MOUTH EVERY 6 HOURS 120 tablet 0   docusate sodium (COLACE) 100 MG capsule Take 1 capsule (100 mg total) by mouth 2 (two) times daily. 10 capsule 0   empagliflozin (JARDIANCE) 10 MG TABS tablet Take 10 mg by mouth daily.     ezetimibe (ZETIA) 10 MG tablet Take 1 tablet (10 mg total) by mouth daily. 90 tablet 3   folic acid (FOLVITE) 1 MG tablet Take 1 tablet (1 mg total) by mouth daily. 90 tablet 0   furosemide (LASIX) 20 MG tablet TAKE 1 TABLET BY MOUTH TWICE A WEEK 24 tablet 1   gabapentin (NEURONTIN) 300 MG capsule TAKE 3 CAPSULES BY MOUTH TWICE DAILY IN THE MORNING AND EVENING AND 2 CAPSULES NIGHTLY 240 capsule 0   glipiZIDE 2.5 MG TABS Take 2.5 mg by mouth 2 (two) times daily before a meal. 60 tablet 2   glucose blood (ACCU-CHEK AVIVA PLUS) test strip Use to check blood sugars up to 4 times daily. ICD-10 E11.40 400 each 0   hydrALAZINE (APRESOLINE) 25 MG tablet TAKE 1 TABLET BY MOUTH THREE TIMES DAILY 270 tablet 1   HYDROcodone-acetaminophen (NORCO) 10-325 MG tablet Take 1 tablet  by mouth 2 (two) times daily as needed. 60 tablet 0   isosorbide mononitrate (IMDUR) 30 MG 24 hr tablet Take 1 tablet (30 mg total) by mouth daily. 90 tablet 3   Lancets (ACCU-CHEK MULTICLIX) lancets Use to to check blood sugars up to 4 times daily. ICD-10: E11.40 400 each 0   levothyroxine (SYNTHROID) 88 MCG tablet Take 1 tablet (88 mcg total) by mouth daily before breakfast. 90 tablet 1   losartan (COZAAR) 100 MG tablet Take 1 tablet by mouth once daily 90 tablet 3   metFORMIN (GLUCOPHAGE) 500 MG tablet Take 1 tablet (500 mg total) by mouth 2 (two) times daily with a meal. 180 tablet 1   methocarbamol (ROBAXIN) 500 MG tablet Take 1 tablet (500 mg total) by mouth every 8 (eight) hours as needed. 90 tablet 2   Multiple Vitamin (MULTIVITAMIN WITH MINERALS) TABS tablet Take 1 tablet by mouth daily.     naloxone (NARCAN) nasal spray 4 mg/0.1  mL Use as needed for excessive sedation /opioid overdose 1 each 1   omeprazole (PRILOSEC) 40 MG capsule TAKE 1 CAPSULE BY MOUTH IN THE MORNING AND 1 CAPSULE IN THE EVENING 180 capsule 3   promethazine (PHENERGAN) 12.5 MG tablet Take 1 tablet (12.5 mg total) by mouth every 4 (four) hours as needed for nausea or vomiting. (Patient taking differently: Take 12.5 mg by mouth every 8 (eight) hours as needed for nausea or vomiting.) 20 tablet 0   traZODone (DESYREL) 100 MG tablet TAKE 1 & 1/2 (ONE & ONE-HALF) TABLETS BY MOUTH AT BEDTIME 135 tablet 0   No current facility-administered medications on file prior to visit.     ROS see history of present illness  Objective  Physical Exam Vitals:   01/16/23 0821  BP: 126/74  Pulse: 81  Temp: 98.5 F (36.9 C)  SpO2: 90%   Ambulatory oxygen saturation 89%  BP Readings from Last 3 Encounters:  01/16/23 126/74  11/30/22 124/66  09/13/22 (!) 190/92   Wt Readings from Last 3 Encounters:  01/16/23 241 lb 12.8 oz (109.7 kg)  11/30/22 240 lb 3.2 oz (109 kg)  09/13/22 225 lb 9.6 oz (102.3 kg)    Physical Exam Constitutional:      General: He is not in acute distress.    Appearance: He is not diaphoretic.  Cardiovascular:     Rate and Rhythm: Normal rate and regular rhythm.     Heart sounds: Normal heart sounds.  Pulmonary:     Effort: Pulmonary effort is normal.     Comments: Coarse breath sounds and wheezing throughout Skin:    General: Skin is warm and dry.  Neurological:     Mental Status: He is alert.    Patient was given an albuterol nebulizer treatment.  His breath sounds improved significantly.  There is still some expiratory wheezing and minimally coarse breath sounds after treatment.  His resting oxygen saturation after the breathing treatment was 95%.  Ambulatory oxygen saturation after breathing treatment dropped to 90%.  Assessment/Plan: Please see individual problem list.  Acute cough Assessment & Plan: Suspect  patient has viral bronchitis.  COVID test today in the office was negative.  Less likely this could be a pneumonia.  We will get a chest x-ray today.  Patient was given a breathing treatment in the office and felt better after this.  Oxygen saturation improved after breathing treatment.  Will treat with prednisone 40 mg daily for 5 days.  He will also use an albuterol  inhaler 2 puffs every 6 hours for the next 2 days and then every 6 hours as needed.  At this point I do not believe antibiotics are indicated unless his chest x-ray reveals a focal pneumonia.  Patient was advised to seek medical attention if he develops shortness of breath that is worsening, cough productive of blood, or fevers.  Tessalon sent to pharmacy for cough as well.  Orders: -     DG Chest 2 View; Future -     predniSONE; Take 2 tablets (40 mg total) by mouth daily with breakfast.  Dispense: 10 tablet; Refill: 0 -     Albuterol Sulfate HFA; Inhale 2 puffs into the lungs every 6 (six) hours as needed for wheezing or shortness of breath.  Dispense: 8 g; Refill: 0 -     POC COVID-19 BinaxNow  Other orders -     Benzonatate; Take 1 capsule (200 mg total) by mouth 2 (two) times daily as needed for cough.  Dispense: 20 capsule; Refill: 0     Return if symptoms worsen or fail to improve.   Marikay Alar, MD [facility]

## 2023-01-16 NOTE — Assessment & Plan Note (Addendum)
Suspect patient has viral bronchitis.  COVID test today in the office was negative.  Less likely this could be a pneumonia.  We will get a chest x-ray today.  Patient was given a breathing treatment in the office and felt better after this.  Oxygen saturation improved after breathing treatment.  Will treat with prednisone 40 mg daily for 5 days.  He will also use an albuterol inhaler 2 puffs every 6 hours for the next 2 days and then every 6 hours as needed.  At this point I do not believe antibiotics are indicated unless his chest x-ray reveals a focal pneumonia.  Patient was advised to seek medical attention if he develops shortness of breath that is worsening, cough productive of blood, or fevers.  Tessalon sent to pharmacy for cough as well.

## 2023-01-16 NOTE — Patient Instructions (Addendum)
Nice to see you. If you start to have worsening shortness of breath, cough productive of blood, or fever please seek medical attention immediately. Please use your albuterol inhaler 2 puffs every 6 hours for the next 2 days and then every 6 hours as needed. We will call you with your chest x-ray results.

## 2023-01-16 NOTE — Addendum Note (Signed)
Addended by: Prince Solian A on: 01/16/2023 09:27 AM   Modules accepted: Orders

## 2023-01-17 ENCOUNTER — Encounter: Payer: Self-pay | Admitting: Internal Medicine

## 2023-01-17 MED ORDER — HYDROCODONE-ACETAMINOPHEN 10-325 MG PO TABS: 1.0000 | ORAL_TABLET | Freq: Two times a day (BID) | ORAL | 0 refills | Status: DC | PRN

## 2023-01-17 NOTE — Telephone Encounter (Signed)
Requesting: Hydrocodone Contract: Yes UDS: Yes Last Visit: 11/30/2022 Next Visit: 03/05/2023 Last Refill: 11/30/2022  Please Advise

## 2023-01-27 ENCOUNTER — Other Ambulatory Visit: Payer: Self-pay | Admitting: Internal Medicine

## 2023-02-02 ENCOUNTER — Other Ambulatory Visit: Payer: Self-pay | Admitting: Internal Medicine

## 2023-02-02 ENCOUNTER — Other Ambulatory Visit: Payer: Self-pay | Admitting: Surgical

## 2023-02-04 ENCOUNTER — Other Ambulatory Visit: Payer: Self-pay | Admitting: Family Medicine

## 2023-02-04 ENCOUNTER — Other Ambulatory Visit: Payer: Self-pay | Admitting: Internal Medicine

## 2023-02-04 DIAGNOSIS — R051 Acute cough: Secondary | ICD-10-CM

## 2023-02-16 ENCOUNTER — Telehealth: Payer: Self-pay | Admitting: Internal Medicine

## 2023-02-16 ENCOUNTER — Ambulatory Visit: Payer: Medicare HMO

## 2023-02-16 ENCOUNTER — Other Ambulatory Visit: Payer: Medicare HMO

## 2023-02-16 DIAGNOSIS — I700 Atherosclerosis of aorta: Secondary | ICD-10-CM | POA: Diagnosis not present

## 2023-02-16 DIAGNOSIS — J189 Pneumonia, unspecified organism: Secondary | ICD-10-CM

## 2023-02-16 NOTE — Telephone Encounter (Signed)
Patient dropped off document  [employer] , to be filled out by provider. Patient requested to send it via Fax within 5-days. Document is located in providers folder at front office.Please advise at  Fax number is [phone].

## 2023-02-19 NOTE — Telephone Encounter (Signed)
Placed in quick sign for signature 

## 2023-02-20 NOTE — Telephone Encounter (Signed)
faxed

## 2023-02-24 ENCOUNTER — Other Ambulatory Visit: Payer: Self-pay | Admitting: Internal Medicine

## 2023-02-27 ENCOUNTER — Ambulatory Visit (INDEPENDENT_AMBULATORY_CARE_PROVIDER_SITE_OTHER): Payer: Medicare HMO | Admitting: *Deleted

## 2023-02-27 VITALS — Ht 72.0 in | Wt 234.0 lb

## 2023-02-27 DIAGNOSIS — Z0000 Encounter for general adult medical examination without abnormal findings: Secondary | ICD-10-CM | POA: Diagnosis not present

## 2023-02-27 NOTE — Patient Instructions (Addendum)
[name] , Thank you for taking time to come for your Medicare Wellness Visit. I appreciate your ongoing commitment to your health goals. Please review the following plan we discussed and let me know if I can assist you in the future.   Referrals/Orders/Follow-Ups/Clinician Recommendations: Remember to update your vaccines and reach out to your GI doctor.  This is a list of the screening recommended for you and due dates:  Health Maintenance  Topic Date Due   Zoster (Shingles) Vaccine (2 of 2) 05/30/2018   COVID-19 Vaccine (4 - 2023-24 season) 12/24/2022   Colon Cancer Screening  04/09/2023   Flu Shot  07/23/2023*   Yearly kidney health urinalysis for diabetes  04/20/2023   Complete foot exam   04/20/2023   Hemoglobin A1C  06/02/2023   Eye exam for diabetics  11/28/2023   Yearly kidney function blood test for diabetes  11/30/2023   Medicare Annual Wellness Visit  02/27/2024   DTaP/Tdap/Td vaccine (2 - Td or Tdap) 10/17/2027   Hepatitis C Screening  Completed   HIV Screening  Completed   HPV Vaccine  Aged Out  *Topic was postponed. The date shown is not the original due date.    Advanced directives: (In Chart) A copy of your advanced directives are scanned into your chart should your provider ever need it.  Next Medicare Annual Wellness Visit scheduled for next year: Yes 03/04/24 @ 3:05   Managing Pain Without Opioids Opioids are strong medicines used to treat moderate to severe pain. For some people, especially those who have long-term (chronic) pain, opioids may not be the best choice for pain management due to: Side effects like nausea, constipation, and sleepiness. The risk of addiction (opioid use disorder). The longer you take opioids, the greater your risk of addiction. Pain that lasts for more than 3 months is called chronic pain. Managing chronic pain usually requires more than one approach and is often provided by a team of health care providers working together  (multidisciplinary approach). Pain management may be done at a pain management center or pain clinic. How to manage pain without the use of opioids Use non-opioid medicines Non-opioid medicines for pain may include: Over-the-counter or prescription non-steroidal anti-inflammatory drugs (NSAIDs). These may be the first medicines used for pain. They work well for muscle and bone pain, and they reduce swelling. Acetaminophen. This over-the-counter medicine may work well for milder pain but not swelling. Antidepressants. These may be used to treat chronic pain. A certain type of antidepressant (tricyclics) is often used. These medicines are given in lower doses for pain than when used for depression. Anticonvulsants. These are usually used to treat seizures but may also reduce nerve (neuropathic) pain. Muscle relaxants. These relieve pain caused by sudden muscle tightening (spasms). You may also use a pain medicine that is applied to the skin as a patch, cream, or gel (topical analgesic), such as a numbing medicine. These may cause fewer side effects than medicines taken by mouth. Do certain therapies as directed Some therapies can help with pain management. They include: Physical therapy. You will do exercises to gain strength and flexibility. A physical therapist may teach you exercises to move and stretch parts of your body that are weak, stiff, or painful. You can learn these exercises at physical therapy visits and practice them at home. Physical therapy may also involve: Massage. Heat wraps or applying heat or cold to affected areas. Electrical signals that interrupt pain signals (transcutaneous electrical nerve stimulation, TENS). Weak lasers  that reduce pain and swelling (low-level laser therapy). Signals from your body that help you learn to regulate pain (biofeedback). Occupational therapy. This helps you to learn ways to function at home and work with less pain. Recreational therapy. This  involves trying new activities or hobbies, such as a physical activity or drawing. Mental health therapy, including: Cognitive behavioral therapy (CBT). This helps you learn coping skills for dealing with pain. Acceptance and commitment therapy (ACT) to change the way you think and react to pain. Relaxation therapies, including muscle relaxation exercises and mindfulness-based stress reduction. Pain management counseling. This may be individual, family, or group counseling.  Receive medical treatments Medical treatments for pain management include: Nerve block injections. These may include a pain blocker and anti-inflammatory medicines. You may have injections: Near the spine to relieve chronic back or neck pain. Into joints to relieve back or joint pain. Into nerve areas that supply a painful area to relieve body pain. Into muscles (trigger point injections) to relieve some painful muscle conditions. A medical device placed near your spine to help block pain signals and relieve nerve pain or chronic back pain (spinal cord stimulation device). Acupuncture. Follow these instructions at home Medicines Take over-the-counter and prescription medicines only as told by your health care provider. If you are taking pain medicine, ask your health care providers about possible side effects to watch out for. Do not drive or use heavy machinery while taking prescription opioid pain medicine. Lifestyle  Do not use drugs or alcohol to reduce pain. If you drink alcohol, limit how much you have to: 0-1 drink a day for women who are not pregnant. 0-2 drinks a day for men. Know how much alcohol is in a drink. In the [place] one drink equals one 12 oz bottle of beer (355 mL), one 5 oz glass of wine (148 mL), or one 1 oz glass of hard liquor (44 mL). Do not use any products that contain nicotine or tobacco. These products include cigarettes, chewing tobacco, and vaping devices, such as e-cigarettes. If you  need help quitting, ask your health care provider. Eat a healthy diet and maintain a healthy weight. Poor diet and excess weight may make pain worse. Eat foods that are high in fiber. These include fresh fruits and vegetables, whole grains, and beans. Limit foods that are high in fat and processed sugars, such as fried and sweet foods. Exercise regularly. Exercise lowers stress and may help relieve pain. Ask your health care provider what activities and exercises are safe for you. If your health care provider approves, join an exercise class that combines movement and stress reduction. Examples include yoga and tai chi. Get enough sleep. Lack of sleep may make pain worse. Lower stress as much as possible. Practice stress reduction techniques as told by your therapist. General instructions Work with all your pain management providers to find the treatments that work best for you. You are an important member of your pain management team. There are many things you can do to reduce pain on your own. Consider joining an online or in-person support group for people who have chronic pain. Keep all follow-up visits. This is important. Where to find more information You can find more information about managing pain without opioids from: American Academy of Pain Medicine: painmed.org Institute for Chronic Pain: instituteforchronicpain.org American Chronic Pain Association: theacpa.org Contact a health care provider if: You have side effects from pain medicine. Your pain gets worse or does not get better with treatments  or home therapy. You are struggling with anxiety or depression. Summary Many types of pain can be managed without opioids. Chronic pain may respond better to pain management without opioids. Pain is best managed when you and a team of health care providers work together. Pain management without opioids may include non-opioid medicines, medical treatments, physical therapy, mental health  therapy, and lifestyle changes. Tell your health care providers if your pain gets worse or is not being managed well enough. This information is not intended to replace advice given to you by your health care provider. Make sure you discuss any questions you have with your health care provider. Document Revised: 07/21/2020 Document Reviewed: 07/21/2020 [employer] Patient Education  2024 [employer].

## 2023-02-27 NOTE — Progress Notes (Signed)
Subjective:   [name] is a 57 y.o. male who presents for Medicare Annual/Subsequent preventive examination.  Visit Complete: Virtual I connected with  [name] on 02/27/23 by a audio enabled telemedicine application and verified that I am speaking with the correct person using two identifiers.  Patient Location: Home  Provider Location: Office/Clinic  I discussed the limitations of evaluation and management by telemedicine. The patient expressed understanding and agreed to proceed.  Vital Signs: Because this visit was a virtual/telehealth visit, some criteria may be missing or patient reported. Any vitals not documented were not able to be obtained and vitals that have been documented are patient reported.  Patient Medicare AWV questionnaire was completed by the patient on 02/26/23; I have confirmed that all information answered by patient is correct and no changes since this date.  Cardiac Risk Factors include: advanced age (>48men, >24 women);diabetes mellitus;dyslipidemia;male gender;hypertension;obesity (BMI >30kg/m2);Other (see comment), Risk factor comments: CAD     Objective:    Today's Vitals   02/27/23 1448 02/27/23 1449  Weight: 234 lb (106.1 kg)   Height: 6' (1.829 m)   PainSc:  7    Body mass index is 31.74 kg/m.     02/27/2023    3:03 PM 09/11/2022   10:48 AM 09/11/2022   10:22 AM 08/10/2022    5:00 PM 08/02/2022   11:29 AM 03/07/2022   10:33 AM 02/28/2022    1:06 PM  Advanced Directives  Does Patient Have a Medical Advance Directive? Yes  No Yes Yes Yes Yes  Type of Estate agent of [place];Living will   Living will;Healthcare Power of [employer] Power of [employer] Power of [place];Living will Healthcare Power of [place];Living will  Does patient want to make changes to medical advance directive? No - Patient declined   No - Patient declined     Copy of Healthcare Power of Attorney in Chart? Yes - validated  most recent copy scanned in chart (See row information)   No - copy requested No - copy requested  Yes - validated most recent copy scanned in chart (See row information)  Would patient like information on creating a medical advance directive?  No - Patient declined         Current Medications (verified) Outpatient Encounter Medications as of 02/27/2023  Medication Sig   albuterol (VENTOLIN HFA) 108 (90 Base) MCG/ACT inhaler INHALE 2 PUFFS BY MOUTH EVERY 6 HOURS AS NEEDED FOR WHEEZING OR SHORTNESS OF BREATH   amLODipine (NORVASC) 5 MG tablet Take 1 tablet (5 mg total) by mouth daily.   ARIPiprazole (ABILIFY) 5 MG tablet Take one tab at bed time   aspirin EC 81 MG tablet Take 1 tablet (81 mg total) by mouth daily at 6 PM. Swallow whole.   atorvastatin (LIPITOR) 80 MG tablet Take 1 tablet (80 mg total) by mouth daily.   benzonatate (TESSALON) 200 MG capsule Take 1 capsule (200 mg total) by mouth 2 (two) times daily as needed for cough.   Blood Glucose Monitoring Suppl (ACCU-CHEK AVIVA PLUS) w/Device KIT Use to check blood sugars up to 4 times daily. ICD-10 E11.40   buPROPion (WELLBUTRIN XL) 300 MG 24 hr tablet Take 1 tablet (300 mg total) by mouth daily.   busPIRone (BUSPAR) 15 MG tablet Take 1 tablet (15 mg total) by mouth 3 (three) times daily.   carvedilol (COREG) 12.5 MG tablet Take 1 tablet (12.5 mg total) by mouth 2 (two) times daily.   celecoxib (  CELEBREX) 100 MG capsule Take 1 capsule by mouth twice daily   clonazePAM (KLONOPIN) 1 MG tablet Take 1 tablet (1 mg total) by mouth 2 (two) times daily as needed for anxiety.   dicyclomine (BENTYL) 20 MG tablet TAKE 1 TABLET BY MOUTH EVERY 6 HOURS   empagliflozin (JARDIANCE) 10 MG TABS tablet Take 10 mg by mouth daily.   ezetimibe (ZETIA) 10 MG tablet Take 1 tablet (10 mg total) by mouth daily.   furosemide (LASIX) 20 MG tablet TAKE 1 TABLET BY MOUTH TWICE A WEEK   gabapentin (NEURONTIN) 300 MG capsule TAKE 3 CAPSULES BY MOUTH IN THE MORNING AND  EVENING AND 2 CAPSULES NIGHTLY   glipiZIDE 2.5 MG TABS Take 2.5 mg by mouth 2 (two) times daily before a meal.   glucose blood (ACCU-CHEK AVIVA PLUS) test strip Use to check blood sugars up to 4 times daily. ICD-10 E11.40   hydrALAZINE (APRESOLINE) 25 MG tablet TAKE 1 TABLET BY MOUTH THREE TIMES DAILY   HYDROcodone-acetaminophen (NORCO) 10-325 MG tablet Take 1 tablet by mouth 2 (two) times daily as needed.   isosorbide mononitrate (IMDUR) 30 MG 24 hr tablet Take 1 tablet (30 mg total) by mouth daily.   Lancets (ACCU-CHEK MULTICLIX) lancets Use to to check blood sugars up to 4 times daily. ICD-10: E11.40   levothyroxine (SYNTHROID) 88 MCG tablet Take 1 tablet (88 mcg total) by mouth daily before breakfast.   losartan (COZAAR) 100 MG tablet Take 1 tablet by mouth once daily   metFORMIN (GLUCOPHAGE) 500 MG tablet Take 1 tablet (500 mg total) by mouth 2 (two) times daily with a meal.   methocarbamol (ROBAXIN) 500 MG tablet Take 1 tablet (500 mg total) by mouth every 8 (eight) hours as needed.   Multiple Vitamin (MULTIVITAMIN WITH MINERALS) TABS tablet Take 1 tablet by mouth daily.   naloxone (NARCAN) nasal spray 4 mg/0.1 mL Use as needed for excessive sedation /opioid overdose   omeprazole (PRILOSEC) 40 MG capsule TAKE 1 CAPSULE BY MOUTH IN THE MORNING AND 1 CAPSULE IN THE EVENING   promethazine (PHENERGAN) 12.5 MG tablet Take 1 tablet (12.5 mg total) by mouth every 4 (four) hours as needed for nausea or vomiting. (Patient taking differently: Take 12.5 mg by mouth every 8 (eight) hours as needed for nausea or vomiting.)   traZODone (DESYREL) 100 MG tablet TAKE 1 & 1/2 (ONE & ONE-HALF) TABLETS BY MOUTH AT BEDTIME   docusate sodium (COLACE) 100 MG capsule Take 1 capsule (100 mg total) by mouth 2 (two) times daily. (Patient not taking: Reported on 02/27/2023)   folic acid (FOLVITE) 1 MG tablet Take 1 tablet (1 mg total) by mouth daily. (Patient not taking: Reported on 02/27/2023)   predniSONE (DELTASONE) 20  MG tablet Take 2 tablets (40 mg total) by mouth daily with breakfast. (Patient not taking: Reported on 02/27/2023)   [DISCONTINUED] amoxicillin-clavulanate (AUGMENTIN) 875-125 MG tablet Take 1 tablet by mouth 2 (two) times daily. (Patient not taking: Reported on 02/27/2023)   No facility-administered encounter medications on file as of 02/27/2023.    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   Anxiety    Anxiety and depression    Arthritis    knees and shoulders   Benzodiazepine withdrawal with complication (HCC) 01/09/2019   Bruit    L  Chest pain    hx   Cognitive complaints with normal neuropsychological exam 10/23/2016   Diabetes mellitus without complication (HCC)    "borderline", diet controlled, no meds, patient has lost 30 lbs   Edema    Fatty liver    GERD (gastroesophageal reflux disease)    uses Omeprazole   Goiter    HLD (hyperlipidemia)    Hypertension    essential, benign   Hypothyroidism    Impotence of organic origin    Murmur    never has caused any problems   Neuromuscular disorder (HCC)    neuropathy bilateral feet   Other chest pain    tightness, pressure   Palpitation    hx   Precordial pain    Sleep apnea    uses cpap   Past Surgical History:  Procedure Laterality Date   APPENDECTOMY  2002   done at [facility]   CARDIAC CATHETERIZATION     CARDIAC CATHETERIZATION N/A 08/27/2015   Procedure: Left Heart Cath and Coronary Angiography;  Surgeon: Dolores Patty, MD;  Location: [facility] INVASIVE CV LAB;  Service: Cardiovascular;  Laterality: N/A;   COLONOSCOPY  in his 20's   COLONOSCOPY  2019   EAR CYST EXCISION Left 07/08/2019   Procedure: open excision baker's cyst left knee;  Surgeon: Cammy Copa, MD;  Location: [facility] OR;  Service: Orthopedics;   Laterality: Left;   HERNIA REPAIR  05/15/2013   umbilical   JOINT REPLACEMENT     right knee   KNEE ARTHROSCOPY Left 12/17/2018   Procedure: left knee arthroscopy, meniscal debridement, loose body removal;  Surgeon: Cammy Copa, MD;  Location: [facility] OR;  Service: Orthopedics;  Laterality: Left;   REVERSE SHOULDER ARTHROPLASTY Right 08/10/2022   Procedure: RIGHT REVERSE SHOULDER ARTHROPLASTY, BICEPS TENODESIS;  Surgeon: Cammy Copa, MD;  Location: [facility] OR;  Service: Orthopedics;  Laterality: Right;   REVISION TOTAL KNEE ARTHROPLASTY Right 06/23/2014   DR [name]   SHOULDER ARTHROSCOPY WITH ROTATOR CUFF REPAIR AND SUBACROMIAL DECOMPRESSION Right 03/07/2022   Procedure: RIGHT SHOULDER ARTHROSCOPY, DEBRIDEMENT, MINI OPEN ROTATOR CUFF TEAR REPAIR;  Surgeon: Cammy Copa, MD;  Location: [facility] OR;  Service: Orthopedics;  Laterality: Right;   TONSILLECTOMY     TOTAL KNEE ARTHROPLASTY Right 2011   right   TOTAL KNEE ARTHROPLASTY Left 02/10/2021   Procedure: LEFT TOTAL KNEE ARTHROPLASTY;  Surgeon: Cammy Copa, MD;  Location: [facility] OR;  Service: Orthopedics;  Laterality: Left;   TOTAL KNEE REVISION Right 06/23/2014   Procedure: TOTAL KNEE REVISION;  Surgeon: Cammy Copa, MD;  Location: [facility] OR;  Service: Orthopedics;  Laterality: Right;   UPPER GASTROINTESTINAL ENDOSCOPY  04/30/2013   Demetrius Charity TOOTH EXTRACTION     Family History  Problem Relation Age of Onset   Heart disease Father    Colon cancer Neg Hx    Colon polyps Neg Hx    Esophageal cancer Neg Hx    Rectal cancer Neg Hx    Stomach cancer Neg Hx    Social History   Socioeconomic History   Marital status: Widowed    Spouse name: Not on file   Number of children: 2   Years of education: Not on file   Highest education level: Associate degree: occupational, Scientist, product/process development, or vocational program  Occupational History   Occupation: Teaching laboratory technician    Employer: [employer]   Occupation:  SERVICE DIRECTOR    Employer: [employer]  Tobacco Use  Smoking status: Former    Current packs/day: 0.00    Average packs/day: 2.0 packs/day for 18.0 years (36.0 ttl pk-yrs)    Types: Cigarettes    Start date: 04/25/1987    Quit date: 04/24/2005    Years since quitting: 17.8   Smokeless tobacco: Never   Tobacco comments:    quit 20 years ago   Vaping Use   Vaping status: Never Used  Substance and Sexual Activity   Alcohol use: Yes    Alcohol/week: 4.0 standard drinks of alcohol    Types: 4 Cans of beer per week    Comment: Occasionally   Drug use: No   Sexual activity: Yes  Other Topics Concern   Not on file  Social History Narrative   Married, gets regular exercise.    Wife deceased   Social Determinants of Health   Financial Resource Strain: Medium Risk (02/26/2023)   Overall Financial Resource Strain (CARDIA)    Difficulty of Paying Living Expenses: Somewhat hard  Food Insecurity: Food Insecurity Present (02/26/2023)   Hunger Vital Sign    Worried About Running Out of Food in the Last Year: Sometimes true    Ran Out of Food in the Last Year: Sometimes true  Transportation Needs: No Transportation Needs (02/26/2023)   PRAPARE - Administrator, Civil Service (Medical): No    Lack of Transportation (Non-Medical): No  Physical Activity: Insufficiently Active (02/26/2023)   Exercise Vital Sign    Days of Exercise per Week: 2 days    Minutes of Exercise per Session: 10 min  Stress: Stress Concern Present (02/26/2023)   [employer] of Occupational Health - Occupational Stress Questionnaire    Feeling of Stress : To some extent  Social Connections: Moderately Integrated (02/26/2023)   Social Connection and Isolation Panel [NHANES]    Frequency of Communication with Friends and Family: More than three times a week    Frequency of Social Gatherings with Friends and Family: Once a week    Attends Religious Services: More than 4 times per year     Active Member of [employer] or Organizations: Yes    Attends Banker Meetings: 1 to 4 times per year    Marital Status: Widowed    Tobacco Counseling Counseling given: Not Answered Tobacco comments: quit 20 years ago    Clinical Intake:  Pre-visit preparation completed: Yes  Pain : 0-10 Pain Score: 7  Pain Type: Chronic pain Pain Location: Foot (both feet) Pain Orientation: Right, Left Pain Descriptors / Indicators: Sharp Pain Onset: More than a month ago Pain Frequency: Constant     BMI - recorded: 31.74 Nutritional Status: BMI > 30  Obese Nutritional Risks: None Diabetes: Yes CBG done?: No Did pt. bring in CBG monitor from home?: No  How often do you need to have someone help you when you read instructions, pamphlets, or other written materials from your doctor or pharmacy?: 1 - Never  Interpreter Needed?: No  Information entered by :: R. Juno Bozard LPN   Activities of Daily Living    02/26/2023    7:23 AM 08/10/2022    5:00 PM  In your present state of health, do you have any difficulty performing the following activities:  Hearing? 0 0  Vision? 0 0  Difficulty concentrating or making decisions? 1 1  Walking or climbing stairs? 1 0  Dressing or bathing? 0 0  Doing errands, shopping? 0 0  Preparing Food and eating ? N   Using the  Toilet? N   In the past six months, have you accidently leaked urine? N   Do you have problems with loss of bowel control? N   Managing your Medications? N   Managing your Finances? N   Housekeeping or managing your Housekeeping? N     Patient Care Team: [name], MD as PCP - General (Internal Medicine) Antonieta Iba, MD as PCP - Cardiology (Cardiology) Lemar Livings, Merrily Pew, MD (General Surgery) Novella Olive, NP as Nurse Practitioner (Nurse Practitioner) [name], MD (Internal Medicine)  Indicate any recent Medical Services you may have received from other than [facility] providers in the past year (date may be  approximate).     Assessment:   This is a routine wellness examination for [place].  Hearing/Vision screen Hearing Screening - Comments:: No issues Vision Screening - Comments:: glasses   Goals Addressed             This Visit's Progress    Patient Stated       Wants to lose weight       Depression Screen    02/27/2023    2:58 PM 01/16/2023    8:23 AM 11/30/2022   10:19 AM 09/13/2022    2:43 PM 07/26/2022    1:26 PM 04/19/2022    9:14 AM 03/02/2022    3:44 PM  PHQ 2/9 Scores  PHQ - 2 Score 2 2 2 2 2 2  0  PHQ- 9 Score 4 4 6 6 5 5      Fall Risk    02/26/2023    7:23 AM 01/16/2023    8:23 AM 11/30/2022   10:19 AM 09/13/2022    2:43 PM 07/26/2022    1:26 PM  Fall Risk   Falls in the past year? 0 0 0 0 0  Number falls in past yr: 0 0 0 0 0  Injury with Fall? 0 0 0 0 0  Risk for fall due to : No Fall Risks No Fall Risks No Fall Risks No Fall Risks No Fall Risks  Follow up Falls prevention discussed;Falls evaluation completed Falls evaluation completed Falls evaluation completed Falls evaluation completed Falls evaluation completed    MEDICARE RISK AT HOME: Medicare Risk at Home Any stairs in or around the home?: Yes If so, are there any without handrails?: No Home free of loose throw rugs in walkways, pet beds, electrical cords, etc?: Yes Adequate lighting in your home to reduce risk of falls?: Yes Life alert?: No Use of a cane, walker or w/c?: Yes Grab bars in the bathroom?: No Shower chair or bench in shower?: No Elevated toilet seat or a handicapped toilet?: No  Cognitive Function:        02/27/2023    3:04 PM 01/30/2022   12:23 PM  6CIT Screen  What Year? 0 points 0 points  What month? 0 points 0 points  What time? 0 points 0 points  Count back from 20 0 points 0 points  Months in reverse 0 points 0 points  Repeat phrase 0 points 0 points  Total Score 0 points 0 points    Immunizations Immunization History  Administered Date(s) Administered    Hepatitis A 01/31/2011   Hepatitis B 01/30/2010, 03/02/2010, 08/31/2010   Influenza Inj Mdck Quad Pf 01/27/2018   Influenza Split 01/29/2012, 01/28/2015   Influenza,inj,Quad PF,6+ Mos 01/30/2013, 02/16/2014, 02/23/2017, 05/28/2020, 02/11/2021, 01/16/2022   Influenza-Unspecified 01/20/2015   Moderna Sars-Covid-2 Vaccination 11/20/2019, 12/18/2019, 05/21/2020   Pneumococcal Polysaccharide-23 03/30/2015  Tdap 10/16/2017   Zoster Recombinant(Shingrix) 04/04/2018    TDAP status: Up to date  Flu Vaccine status: Due, Education has been provided regarding the importance of this vaccine. Advised may receive this vaccine at local pharmacy or Health Dept. Aware to provide a copy of the vaccination record if obtained from local pharmacy or Health Dept. Verbalized acceptance and understanding.  Pneumococcal vaccine status: Up to date  Covid-19 vaccine status: Information provided on how to obtain vaccines.   Qualifies for Shingles Vaccine? Yes   Zostavax completed No   Shingrix Completed?: No.    Education has been provided regarding the importance of this vaccine. Patient has been advised to call insurance company to determine out of pocket expense if they have not yet received this vaccine. Advised may also receive vaccine at local pharmacy or Health Dept. Verbalized acceptance and understanding.  Screening Tests Health Maintenance  Topic Date Due   Zoster Vaccines- Shingrix (2 of 2) 05/30/2018   COVID-19 Vaccine (4 - 2023-24 season) 12/24/2022   Medicare Annual Wellness (AWV)  01/31/2023   Colonoscopy  04/09/2023   INFLUENZA VACCINE  07/23/2023 (Originally 11/23/2022)   Diabetic kidney evaluation - Urine ACR  04/20/2023   FOOT EXAM  04/20/2023   HEMOGLOBIN A1C  06/02/2023   OPHTHALMOLOGY EXAM  11/28/2023   Diabetic kidney evaluation - eGFR measurement  11/30/2023   DTaP/Tdap/Td (2 - Td or Tdap) 10/17/2027   Hepatitis C Screening  Completed   HIV Screening  Completed   HPV VACCINES  Aged  Out    Health Maintenance  Health Maintenance Due  Topic Date Due   Zoster Vaccines- Shingrix (2 of 2) 05/30/2018   COVID-19 Vaccine (4 - 2023-24 season) 12/24/2022   Medicare Annual Wellness (AWV)  01/31/2023   Colonoscopy  04/09/2023    Colorectal cancer screening: Type of screening: Colonoscopy. Completed 03/2020. Repeat every 3 years Patient is going to call GI and talk with them  Lung Cancer Screening: (Low Dose CT Chest recommended if Age 45-80 years, 20 pack-year currently smoking OR have quit w/in 15years.) does not qualify.     Additional Screening:  Hepatitis C Screening: does qualify; Completed 03/2012  Vision Screening: Recommended annual ophthalmology exams for early detection of glaucoma and other disorders of the eye. Is the patient up to date with their annual eye exam?  Yes  Who is the provider or what is the name of the office in which the patient attends annual eye exams? [facility] If pt is not established with a provider, would they like to be referred to a provider to establish care? No .   Dental Screening: Recommended annual dental exams for proper oral hygiene  Diabetic Foot Exam: Diabetic Foot Exam: Completed 03/2022  Community Resource Referral / Chronic Care Management: CRR required this visit?  No   CCM required this visit?  No     Plan:     I have personally reviewed and noted the following in the patient's chart:   Medical and social history Use of alcohol, tobacco or illicit drugs  Current medications and supplements including opioid prescriptions. Patient is currently taking opioid prescriptions. Information provided to patient regarding non-opioid alternatives. Patient advised to discuss non-opioid treatment plan with their provider. Functional ability and status Nutritional status Physical activity Advanced directives List of other physicians Hospitalizations, surgeries, and ER visits in previous 12 months Vitals Screenings to  include cognitive, depression, and falls Referrals and appointments  In addition, I have reviewed and discussed with  patient certain preventive protocols, quality metrics, and best practice recommendations. A written personalized care plan for preventive services as well as general preventive health recommendations were provided to patient.     Sydell Axon, LPN   16/04/958   After Visit Summary: (MyChart) Due to this being a telephonic visit, the after visit summary with patients personalized plan was offered to patient via MyChart   Nurse Notes: None

## 2023-03-04 ENCOUNTER — Other Ambulatory Visit: Payer: Self-pay | Admitting: Surgical

## 2023-03-04 ENCOUNTER — Other Ambulatory Visit: Payer: Self-pay | Admitting: Internal Medicine

## 2023-03-05 ENCOUNTER — Ambulatory Visit: Payer: Medicare HMO | Admitting: Internal Medicine

## 2023-03-11 ENCOUNTER — Other Ambulatory Visit: Payer: Self-pay | Admitting: Internal Medicine

## 2023-03-14 ENCOUNTER — Encounter: Payer: Self-pay | Admitting: Internal Medicine

## 2023-03-15 MED ORDER — HYDROCODONE-ACETAMINOPHEN 10-325 MG PO TABS: 1.0000 | ORAL_TABLET | Freq: Two times a day (BID) | ORAL | 0 refills | Status: DC | PRN

## 2023-03-15 NOTE — Telephone Encounter (Signed)
Requesting: Hydrocodone Contract: Yes UDS: 11/30/2022 Last Visit: 11/30/2022 Next Visit: 04/11/2023 Last Refill: 01/17/2023  Please Advise

## 2023-03-19 ENCOUNTER — Encounter: Payer: Self-pay | Admitting: Gastroenterology

## 2023-03-27 ENCOUNTER — Other Ambulatory Visit: Payer: Self-pay | Admitting: Internal Medicine

## 2023-04-03 ENCOUNTER — Other Ambulatory Visit (HOSPITAL_COMMUNITY): Payer: Self-pay | Admitting: Psychiatry

## 2023-04-03 ENCOUNTER — Other Ambulatory Visit: Payer: Self-pay | Admitting: Internal Medicine

## 2023-04-03 ENCOUNTER — Other Ambulatory Visit: Payer: Self-pay | Admitting: Surgical

## 2023-04-03 DIAGNOSIS — F331 Major depressive disorder, recurrent, moderate: Secondary | ICD-10-CM

## 2023-04-06 ENCOUNTER — Encounter (HOSPITAL_COMMUNITY): Payer: Self-pay | Admitting: Psychiatry

## 2023-04-06 ENCOUNTER — Telehealth (HOSPITAL_BASED_OUTPATIENT_CLINIC_OR_DEPARTMENT_OTHER): Payer: Medicare HMO | Admitting: Psychiatry

## 2023-04-06 VITALS — Wt 234.0 lb

## 2023-04-06 DIAGNOSIS — F411 Generalized anxiety disorder: Secondary | ICD-10-CM | POA: Diagnosis not present

## 2023-04-06 DIAGNOSIS — F331 Major depressive disorder, recurrent, moderate: Secondary | ICD-10-CM

## 2023-04-06 DIAGNOSIS — F419 Anxiety disorder, unspecified: Secondary | ICD-10-CM | POA: Diagnosis not present

## 2023-04-06 MED ORDER — ARIPIPRAZOLE 5 MG PO TABS: ORAL_TABLET | ORAL | 0 refills | Status: DC

## 2023-04-06 MED ORDER — BUPROPION HCL ER (XL) 300 MG PO TB24: 300.0000 mg | ORAL_TABLET | Freq: Every day | ORAL | 0 refills | Status: DC

## 2023-04-06 NOTE — Progress Notes (Signed)
[facility] MD Virtual Progress Note   Patient Location: Home Provider Location: Home Office  I connect with patient by telephone and verified that I am speaking with correct person by using two identifiers. I discussed the limitations of evaluation and management by telemedicine and the availability of in person appointments. I also discussed with the patient that there may be a patient responsible charge related to this service. The patient expressed understanding and agreed to proceed.  [name] [id number] 57 y.o.  04/06/2023 10:18 AM  History of Present Illness:  Patient is evaluated by phone session.  Video sessions not able to done due to camera issues.  Patient reported things are going okay.  He reported that was the first Thanksgiving after his 70 year old mother died in 2022-11-28.  Patient denies any panic attack, crying spells or any feeling of hopelessness or worthlessness.  He is still waiting for his daughter's toxicology report.  Patient remains attached with the church.  He tried to keep himself busy.  He denies any crying spells or any feeling of hopelessness or worthlessness.  He has appointment coming up soon with his primary care for blood work.  He reported he is checking his blood sugar with his better.  He is going to have a hemoglobin A1c next week.  He is taking Klonopin, BuSpar and trazodone from his primary care.  He also taking gabapentin for neuropathy.  He denies any hallucination, paranoia.  He is reluctant to cut down the medications as it is working well.  He denies drinking or using any illegal substances.  All his family lives local and he has plan to spend time with the family around upcoming holidays.  Past Psychiatric History: H/O depression and anxiety.  No h/o suicidal attempt or inpatient treatment.  Seen Dr. Edwin Dada and given Zoloft, Klonopin and Cymbalta.  Later PCP added BuSpar and trazodone.  We tried Lamictal but did not help.      Outpatient Encounter Medications as of 04/06/2023  Medication Sig   albuterol (VENTOLIN HFA) 108 (90 Base) MCG/ACT inhaler INHALE 2 PUFFS BY MOUTH EVERY 6 HOURS AS NEEDED FOR WHEEZING OR SHORTNESS OF BREATH   amLODipine (NORVASC) 5 MG tablet Take 1 tablet by mouth once daily   ARIPiprazole (ABILIFY) 5 MG tablet Take one tab at bed time   aspirin EC 81 MG tablet Take 1 tablet (81 mg total) by mouth daily at 6 PM. Swallow whole.   atorvastatin (LIPITOR) 80 MG tablet Take 1 tablet (80 mg total) by mouth daily.   benzonatate (TESSALON) 200 MG capsule Take 1 capsule (200 mg total) by mouth 2 (two) times daily as needed for cough.   Blood Glucose Monitoring Suppl (ACCU-CHEK AVIVA PLUS) w/Device KIT Use to check blood sugars up to 4 times daily. ICD-10 E11.40   buPROPion (WELLBUTRIN XL) 300 MG 24 hr tablet Take 1 tablet (300 mg total) by mouth daily.   busPIRone (BUSPAR) 15 MG tablet Take 1 tablet (15 mg total) by mouth 3 (three) times daily.   carvedilol (COREG) 12.5 MG tablet Take 1 tablet (12.5 mg total) by mouth 2 (two) times daily.   celecoxib (CELEBREX) 100 MG capsule Take 1 capsule by mouth twice daily   clonazePAM (KLONOPIN) 1 MG tablet Take 1 tablet by mouth twice daily as needed for anxiety   dicyclomine (BENTYL) 20 MG tablet TAKE 1 TABLET BY MOUTH EVERY 6 HOURS   docusate sodium (COLACE) 100 MG capsule Take 1 capsule (100 mg total)  by mouth 2 (two) times daily. (Patient not taking: Reported on 02/27/2023)   empagliflozin (JARDIANCE) 10 MG TABS tablet Take 10 mg by mouth daily.   ezetimibe (ZETIA) 10 MG tablet Take 1 tablet (10 mg total) by mouth daily.   folic acid (FOLVITE) 1 MG tablet Take 1 tablet (1 mg total) by mouth daily. (Patient not taking: Reported on 02/27/2023)   furosemide (LASIX) 20 MG tablet TAKE 1 TABLET BY MOUTH TWICE A WEEK   gabapentin (NEURONTIN) 300 MG capsule TAKE 3 CAPSULES BY MOUTH IN THE MORNING, 3 CAPSULES IN THE EVENING, AND 2 CAPSULES NIGHTLY.   glipiZIDE 2.5  MG TABS TAKE 1 TABLET BY MOUTH TWICE DAILY BEFORE A MEAL   glucose blood (ACCU-CHEK AVIVA PLUS) test strip Use to check blood sugars up to 4 times daily. ICD-10 E11.40   hydrALAZINE (APRESOLINE) 25 MG tablet TAKE 1 TABLET BY MOUTH THREE TIMES DAILY   HYDROcodone-acetaminophen (NORCO) 10-325 MG tablet Take 1 tablet by mouth 2 (two) times daily as needed.   isosorbide mononitrate (IMDUR) 30 MG 24 hr tablet Take 1 tablet (30 mg total) by mouth daily.   Lancets (ACCU-CHEK MULTICLIX) lancets Use to to check blood sugars up to 4 times daily. ICD-10: E11.40   levothyroxine (SYNTHROID) 88 MCG tablet Take 1 tablet (88 mcg total) by mouth daily before breakfast.   losartan (COZAAR) 100 MG tablet Take 1 tablet by mouth once daily   metFORMIN (GLUCOPHAGE) 500 MG tablet Take 1 tablet (500 mg total) by mouth 2 (two) times daily with a meal.   methocarbamol (ROBAXIN) 500 MG tablet Take 1 tablet (500 mg total) by mouth every 8 (eight) hours as needed.   Multiple Vitamin (MULTIVITAMIN WITH MINERALS) TABS tablet Take 1 tablet by mouth daily.   naloxone (NARCAN) nasal spray 4 mg/0.1 mL Use as needed for excessive sedation /opioid overdose   omeprazole (PRILOSEC) 40 MG capsule TAKE 1 CAPSULE BY MOUTH IN THE MORNING AND 1 CAPSULE IN THE EVENING   predniSONE (DELTASONE) 20 MG tablet Take 2 tablets (40 mg total) by mouth daily with breakfast. (Patient not taking: Reported on 02/27/2023)   promethazine (PHENERGAN) 12.5 MG tablet Take 1 tablet (12.5 mg total) by mouth every 4 (four) hours as needed for nausea or vomiting. (Patient taking differently: Take 12.5 mg by mouth every 8 (eight) hours as needed for nausea or vomiting.)   traZODone (DESYREL) 100 MG tablet TAKE 1 & 1/2 (ONE & ONE-HALF) TABLETS BY MOUTH AT BEDTIME   No facility-administered encounter medications on file as of 04/06/2023.    Recent Results (from the past 2160 hours)  POC COVID-19     Status: None   Collection Time: 01/16/23  9:27 AM  Result Value  Ref Range   SARS Coronavirus 2 Ag Negative Negative     Psychiatric Specialty Exam: Physical Exam  Review of Systems  Weight 234 lb (106.1 kg).There is no height or weight on file to calculate BMI.  General Appearance: NA  Eye Contact:  NA  Speech:  Normal Rate  Volume:  Normal  Mood:  Euthymic  Affect:  Appropriate  Thought Process:  Goal Directed  Orientation:  Full (Time, Place, and Person)  Thought Content:  Logical  Suicidal Thoughts:  No  Homicidal Thoughts:  No  Memory:  Immediate;   Good Recent;   Good Remote;   Good  Judgement:  Good  Insight:  Good  Psychomotor Activity:  Normal  Concentration:  Concentration: Good and Attention Span: Good  Recall:  Good  Fund of Knowledge:  Good  Language:  Good  Akathisia:  No  Handed:  Right  AIMS (if indicated):     Assets:  Communication Skills Desire for Improvement Housing Resilience Social Support Transportation  ADL's:  Intact  Cognition:  WNL  Sleep:  ok     Assessment/Plan: MDD (major depressive disorder), recurrent episode, moderate (HCC) - Plan: ARIPiprazole (ABILIFY) 5 MG tablet, buPROPion (WELLBUTRIN XL) 300 MG 24 hr tablet  GAD (generalized anxiety disorder)  Anxiety - Plan: buPROPion (WELLBUTRIN XL) 300 MG 24 hr tablet  Patient is stable on his current medication.  His plan is to spend time with the family on upcoming holidays.  He does not want to change the medication since he feels symptoms are stable.  He is going to have a visit with his primary care for blood work.  He is hoping that hemoglobin A1c will improved since he is checking his blood sugar and watching his calorie intake.  Discussed polypharmacy.  Patient like to keep his current medication.  Continue Abilify 5 mg daily, Wellbutrin XL 3 mg daily.  He is getting trazodone, Klonopin, BuSpar and gabapentin from his primary care.  I have discussed if hemoglobin A1c is higher than may consider lowering the Abilify dose.  Recommended to call us  back with any question or any concern.  Follow-up in 3 months   Follow Up Instructions:     I discussed the assessment and treatment plan with the patient. The patient was provided an opportunity to ask questions and all were answered. The patient agreed with the plan and demonstrated an understanding of the instructions.   The patient was advised to call back or seek an in-person evaluation if the symptoms worsen or if the condition fails to improve as anticipated.    Collaboration of Care: Other provider involved in patient's care AEB notes are available in epic to review  Patient/Guardian was advised Release of Information must be obtained prior to any record release in order to collaborate their care with an outside provider. Patient/Guardian was advised if they have not already done so to contact the registration department to sign all necessary forms in order for [place] to release information regarding their care.   Consent: Patient/Guardian gives verbal consent for treatment and assignment of benefits for services provided during this visit. Patient/Guardian expressed understanding and agreed to proceed.     I provided 20 minutes of non face to face time during this encounter.  Note: This document was prepared by [employer] voice dictation technology and any errors that results from this process are unintentional.    Cleotis Nipper, MD 04/06/2023

## 2023-04-09 ENCOUNTER — Other Ambulatory Visit: Payer: Self-pay | Admitting: Internal Medicine

## 2023-04-11 ENCOUNTER — Other Ambulatory Visit: Payer: Self-pay | Admitting: Cardiovascular Disease

## 2023-04-11 ENCOUNTER — Encounter: Payer: Self-pay | Admitting: Internal Medicine

## 2023-04-11 ENCOUNTER — Ambulatory Visit: Payer: Medicare HMO | Admitting: Internal Medicine

## 2023-04-11 VITALS — BP 156/78 | HR 81 | Ht 72.0 in | Wt 248.8 lb

## 2023-04-11 DIAGNOSIS — E1169 Type 2 diabetes mellitus with other specified complication: Secondary | ICD-10-CM

## 2023-04-11 DIAGNOSIS — R609 Edema, unspecified: Secondary | ICD-10-CM | POA: Diagnosis not present

## 2023-04-11 DIAGNOSIS — E785 Hyperlipidemia, unspecified: Secondary | ICD-10-CM | POA: Diagnosis not present

## 2023-04-11 DIAGNOSIS — I10 Essential (primary) hypertension: Secondary | ICD-10-CM | POA: Diagnosis not present

## 2023-04-11 DIAGNOSIS — F411 Generalized anxiety disorder: Secondary | ICD-10-CM

## 2023-04-11 DIAGNOSIS — Z7984 Long term (current) use of oral hypoglycemic drugs: Secondary | ICD-10-CM | POA: Diagnosis not present

## 2023-04-11 DIAGNOSIS — E034 Atrophy of thyroid (acquired): Secondary | ICD-10-CM | POA: Diagnosis not present

## 2023-04-11 DIAGNOSIS — E119 Type 2 diabetes mellitus without complications: Secondary | ICD-10-CM

## 2023-04-11 DIAGNOSIS — Z1211 Encounter for screening for malignant neoplasm of colon: Secondary | ICD-10-CM

## 2023-04-11 DIAGNOSIS — E11638 Type 2 diabetes mellitus with other oral complications: Secondary | ICD-10-CM

## 2023-04-11 DIAGNOSIS — D1723 Benign lipomatous neoplasm of skin and subcutaneous tissue of right leg: Secondary | ICD-10-CM

## 2023-04-11 DIAGNOSIS — F331 Major depressive disorder, recurrent, moderate: Secondary | ICD-10-CM

## 2023-04-11 MED ORDER — DICYCLOMINE HCL 20 MG PO TABS: 20.0000 mg | ORAL_TABLET | Freq: Four times a day (QID) | ORAL | 0 refills | Status: DC

## 2023-04-11 MED ORDER — LOSARTAN POTASSIUM 100 MG PO TABS: 100.0000 mg | ORAL_TABLET | Freq: Every day | ORAL | 1 refills | Status: DC

## 2023-04-11 MED ORDER — CARVEDILOL 25 MG PO TABS: 25.0000 mg | ORAL_TABLET | Freq: Two times a day (BID) | ORAL | 2 refills | Status: DC

## 2023-04-11 MED ORDER — HYDRALAZINE HCL 25 MG PO TABS: 25.0000 mg | ORAL_TABLET | Freq: Three times a day (TID) | ORAL | 1 refills | Status: DC

## 2023-04-11 MED ORDER — HYDROCODONE-ACETAMINOPHEN 10-325 MG PO TABS: 1.0000 | ORAL_TABLET | Freq: Two times a day (BID) | ORAL | 0 refills | Status: DC | PRN

## 2023-04-11 MED ORDER — BUSPIRONE HCL 15 MG PO TABS: 15.0000 mg | ORAL_TABLET | Freq: Three times a day (TID) | ORAL | 3 refills | Status: DC

## 2023-04-11 MED ORDER — ROSUVASTATIN CALCIUM 40 MG PO TABS: 40.0000 mg | ORAL_TABLET | Freq: Every day | ORAL | 1 refills | Status: DC

## 2023-04-11 MED ORDER — TRAZODONE HCL 100 MG PO TABS: ORAL_TABLET | ORAL | 1 refills | Status: DC

## 2023-04-11 MED ORDER — OMEPRAZOLE 40 MG PO CPDR: DELAYED_RELEASE_CAPSULE | ORAL | 3 refills | Status: DC

## 2023-04-11 MED ORDER — METFORMIN HCL 500 MG PO TABS: 500.0000 mg | ORAL_TABLET | Freq: Two times a day (BID) | ORAL | 1 refills | Status: DC

## 2023-04-11 MED ORDER — FUROSEMIDE 20 MG PO TABS: 20.0000 mg | ORAL_TABLET | Freq: Every day | ORAL | 1 refills | Status: DC

## 2023-04-11 MED ORDER — GABAPENTIN 300 MG PO CAPS: 300.0000 mg | ORAL_CAPSULE | Freq: Three times a day (TID) | ORAL | 1 refills | Status: DC

## 2023-04-11 MED ORDER — CLONAZEPAM 1 MG PO TABS: 1.0000 mg | ORAL_TABLET | Freq: Two times a day (BID) | ORAL | 2 refills | Status: DC | PRN

## 2023-04-11 MED ORDER — EZETIMIBE 10 MG PO TABS: 10.0000 mg | ORAL_TABLET | Freq: Every day | ORAL | 3 refills | Status: DC

## 2023-04-11 MED ORDER — GLIPIZIDE 2.5 MG PO TABS: 1.0000 | ORAL_TABLET | Freq: Two times a day (BID) | ORAL | 1 refills | Status: DC

## 2023-04-11 MED ORDER — AMLODIPINE BESYLATE 5 MG PO TABS: 5.0000 mg | ORAL_TABLET | Freq: Every day | ORAL | 3 refills | Status: DC

## 2023-04-11 MED ORDER — LEVOTHYROXINE SODIUM 88 MCG PO TABS: 88.0000 ug | ORAL_TABLET | Freq: Every day | ORAL | 1 refills | Status: DC

## 2023-04-11 MED ORDER — FUROSEMIDE 20 MG PO TABS: ORAL_TABLET | ORAL | 1 refills | Status: DC

## 2023-04-11 NOTE — Progress Notes (Unsigned)
Subjective:  Patient ID: [name], male    DOB: 03/11/1966  Age: 57 y.o. MRN: [id number]  CC: The primary encounter diagnosis was Colon cancer screening. 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   HPI [name] presents for  Chief Complaint  Patient presents with   Medical Management of Chronic Issues    3 month follow up       Outpatient Medications Prior to Visit  Medication Sig Dispense Refill   albuterol (VENTOLIN HFA) 108 (90 Base) MCG/ACT inhaler INHALE 2 PUFFS BY MOUTH EVERY 6 HOURS AS NEEDED FOR WHEEZING OR SHORTNESS OF BREATH 9 g 0   ARIPiprazole (ABILIFY) 5 MG tablet Take one tab at bed time 90 tablet 0   aspirin EC 81 MG tablet Take 1 tablet (81 mg total) by mouth daily at 6 PM. Swallow whole. 30 tablet 12   benzonatate (TESSALON) 200 MG capsule Take 1 capsule (200 mg total) by mouth 2 (two) times daily as needed for cough. 20 capsule 0   Blood Glucose Monitoring Suppl (ACCU-CHEK AVIVA PLUS) w/Device KIT Use to check blood sugars up to 4 times daily. ICD-10 E11.40 1 kit 0   buPROPion (WELLBUTRIN XL) 300 MG 24 hr tablet Take 1 tablet (300 mg total) by mouth daily. 90 tablet 0   carvedilol (COREG) 12.5 MG tablet Take 1 tablet (12.5 mg total) by mouth 2 (two) times daily. 180 tablet 3   celecoxib (CELEBREX) 100 MG capsule Take 1 capsule by mouth twice daily 60 capsule 0   clonazePAM (KLONOPIN) 1 MG tablet Take 1 tablet by mouth twice daily as needed for anxiety 60 tablet 0   docusate sodium (COLACE) 100 MG capsule Take 1 capsule (100 mg total) by mouth 2 (two) times daily. 10 capsule 0    empagliflozin (JARDIANCE) 10 MG TABS tablet Take 10 mg by mouth daily.     folic acid (FOLVITE) 1 MG tablet Take 1 tablet (1 mg total) by mouth daily. 90 tablet 0   gabapentin (NEURONTIN) 300 MG capsule TAKE 3 CAPSULES BY MOUTH IN THE MORNING, 3 CAPSULES IN THE EVENING, AND 2 CAPSULES NIGHTLY. 240 capsule 1   glucose blood (ACCU-CHEK AVIVA PLUS) test strip Use to check blood sugars up to 4 times daily. ICD-10 E11.40 400 each 0   HYDROcodone-acetaminophen (NORCO) 10-325 MG tablet Take 1 tablet by mouth 2 (two) times daily as needed. 60 tablet 0   isosorbide mononitrate (IMDUR) 30 MG 24 hr tablet Take 1 tablet by mouth once daily 90 tablet 0   Lancets (ACCU-CHEK MULTICLIX) lancets Use to to check blood sugars up to 4 times daily. ICD-10: E11.40 400 each 0   methocarbamol (ROBAXIN) 500 MG tablet Take 1 tablet (500 mg total) by mouth every 8 (eight) hours as needed. 90 tablet 2   Multiple Vitamin (MULTIVITAMIN WITH MINERALS) TABS tablet Take 1 tablet by mouth daily.     naloxone (NARCAN) nasal spray 4 mg/0.1 mL Use as needed for excessive sedation /opioid overdose 1 each 1   predniSONE (DELTASONE) 20 MG tablet Take 2 tablets (40 mg total) by mouth daily with breakfast. 10 tablet 0   promethazine (PHENERGAN) 12.5 MG tablet Take 1 tablet (  12.5 mg total) by mouth every 4 (four) hours as needed for nausea or vomiting. (Patient taking differently: Take 12.5 mg by mouth every 8 (eight) hours as needed for nausea or vomiting.) 20 tablet 0   amLODipine (NORVASC) 5 MG tablet Take 1 tablet by mouth once daily 90 tablet 0   atorvastatin (LIPITOR) 80 MG tablet Take 1 tablet (80 mg total) by mouth daily. 180 tablet 3   busPIRone (BUSPAR) 15 MG tablet Take 1 tablet (15 mg total) by mouth 3 (three) times daily. 270 tablet 1   dicyclomine (BENTYL) 20 MG tablet TAKE 1 TABLET BY MOUTH EVERY 6 HOURS 120 tablet 0   ezetimibe (ZETIA) 10 MG tablet Take 1 tablet (10 mg total) by mouth daily. 90 tablet 3   furosemide (LASIX)  20 MG tablet TAKE 1 TABLET BY MOUTH TWICE A WEEK 24 tablet 1   glipiZIDE 2.5 MG TABS TAKE 1 TABLET BY MOUTH TWICE DAILY BEFORE A MEAL 180 tablet 0   hydrALAZINE (APRESOLINE) 25 MG tablet TAKE 1 TABLET BY MOUTH THREE TIMES DAILY 270 tablet 1   levothyroxine (SYNTHROID) 88 MCG tablet Take 1 tablet (88 mcg total) by mouth daily before breakfast. 90 tablet 1   losartan (COZAAR) 100 MG tablet Take 1 tablet by mouth once daily 90 tablet 0   metFORMIN (GLUCOPHAGE) 500 MG tablet Take 1 tablet (500 mg total) by mouth 2 (two) times daily with a meal. 180 tablet 1   omeprazole (PRILOSEC) 40 MG capsule TAKE 1 CAPSULE BY MOUTH IN THE MORNING AND 1 CAPSULE IN THE EVENING 180 capsule 3   traZODone (DESYREL) 100 MG tablet TAKE 1 & 1/2 (ONE & ONE-HALF) TABLETS BY MOUTH AT BEDTIME 135 tablet 0   No facility-administered medications prior to visit.    Review of Systems;  Patient denies headache, fevers, malaise, unintentional weight loss, skin rash, eye pain, sinus congestion and sinus pain, sore throat, dysphagia,  hemoptysis , cough, dyspnea, wheezing, chest pain, palpitations, orthopnea, edema, abdominal pain, nausea, melena, diarrhea, constipation, flank pain, dysuria, hematuria, urinary  Frequency, nocturia, numbness, tingling, seizures,  Focal weakness, Loss of consciousness,  Tremor, insomnia, depression, anxiety, and suicidal ideation.      Objective:  BP (!) 150/78   Pulse 81   Ht 6' (1.829 m)   Wt 248 lb 12.8 oz (112.9 kg)   SpO2 94%   BMI 33.74 kg/m   BP Readings from Last 3 Encounters:  04/11/23 (!) 150/78  01/16/23 126/74  11/30/22 124/66    Wt Readings from Last 3 Encounters:  04/11/23 248 lb 12.8 oz (112.9 kg)  04/06/23 234 lb (106.1 kg)  02/27/23 234 lb (106.1 kg)    Physical Exam  Lab Results  Component Value Date   HGBA1C 7.6 (H) 11/30/2022   HGBA1C 7.1 (H) 07/26/2022   HGBA1C 6.4 04/19/2022    Lab Results  Component Value Date   CREATININE 1.12 11/30/2022    CREATININE 1.02 09/11/2022   CREATININE 0.89 08/16/2022    Lab Results  Component Value Date   WBC 6.4 09/11/2022   HGB 13.6 09/11/2022   HCT 41.3 09/11/2022   PLT 268 09/11/2022   GLUCOSE 119 (H) 11/30/2022   CHOL 124 11/30/2022   TRIG 325.0 (H) 11/30/2022   HDL 30.60 (L) 11/30/2022   LDLDIRECT 79.0 11/30/2022   LDLCALC 55 04/19/2022   ALT 25 11/30/2022   AST 23 11/30/2022   NA 138 11/30/2022   K 5.0 11/30/2022   CL 102 11/30/2022  CREATININE 1.12 11/30/2022   BUN 17 11/30/2022   CO2 31 11/30/2022   TSH 2.16 07/26/2022   PSA 0.70 10/16/2017   INR 0.98 08/27/2015   HGBA1C 7.6 (H) 11/30/2022   MICROALBUR <0.7 04/19/2022    CT Head Wo Contrast Result Date: 09/11/2022 CLINICAL DATA:  Headaches EXAM: CT HEAD WITHOUT CONTRAST TECHNIQUE: Contiguous axial images were obtained from the base of the skull through the vertex without intravenous contrast. RADIATION DOSE REDUCTION: This exam was performed according to the departmental dose-optimization program which includes automated exposure control, adjustment of the mA and/or kV according to patient size and/or use of iterative reconstruction technique. COMPARISON:  09/24/2017 FINDINGS: Brain: No acute intracranial findings are seen. There are no signs of bleeding within the cranium. Ventricles are unremarkable. Cortical sulci are prominent. Vascular: Unremarkable. Skull: No acute findings are seen. Sinuses/Orbits: Unremarkable. Other: None. IMPRESSION: No acute intracranial findings are seen in noncontrast CT brain. Electronically Signed   By: Ernie Avena M.D.   On: 09/11/2022 11:27   DG Chest 2 View Result Date: 09/11/2022 CLINICAL DATA:  57 year old male with hypertension. EXAM: CHEST - 2 VIEW COMPARISON:  Chest radiographs 08/12/2022 and earlier. FINDINGS: PA and lateral views at 1046 hours. Right shoulder arthroplasty redemonstrated, new from last year. Lung volumes and mediastinal contours are normal. Visualized tracheal air  column is within normal limits. Both lungs appear clear. No pneumothorax or pleural effusion. No acute osseous abnormality identified. Negative visible bowel gas. IMPRESSION: Negative, no cardiopulmonary abnormality. Electronically Signed   By: Odessa Fleming M.D.   On: 09/11/2022 11:02    Assessment & Plan:  .Colon cancer screening -     Ambulatory referral to Gastroenterology  Essential hypertension  Hyperlipidemia associated with type 2 diabetes mellitus (HCC) -     Lipid panel -     LDL cholesterol, direct -     CBC with Differential/Platelet  Diabetes mellitus treated with oral medication (HCC) -     Microalbumin / creatinine urine ratio -     Hemoglobin A1c -     Comprehensive metabolic panel  Hypothyroidism due to acquired atrophy of thyroid -     TSH  GAD (generalized anxiety disorder) -     busPIRone HCl; Take 1 tablet (15 mg total) by mouth 3 (three) times daily.  Dispense: 270 tablet; Refill: 3 -     traZODone HCl; TAKE 1 & 1/2 (ONE & ONE-HALF) TABLETS BY MOUTH AT BEDTIME  Dispense: 135 tablet; Refill: 1  MDD (major depressive disorder), recurrent episode, moderate (HCC) -     busPIRone HCl; Take 1 tablet (15 mg total) by mouth 3 (three) times daily.  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  Dispense: 90 tablet; Refill: 3 -     Dicyclomine HCl; Take 1 tablet (20 mg total) by mouth every 6 (six) hours.  Dispense: 360 tablet; Refill: 0 -     Ezetimibe; Take 1 tablet (10 mg total) by mouth daily.  Dispense: 90 tablet; Refill: 3 -     Furosemide; TAKE 1 TABLET BY MOUTH TWICE A WEEK  Dispense: 24 tablet; Refill: 1 -  glipiZIDE; Take 1 tablet by mouth 2 (two) times daily before a  meal.  Dispense: 180 tablet; Refill: 1 -     hydrALAZINE HCl; Take 1 tablet (25 mg total) by mouth 3 (three) times daily.  Dispense: 270 tablet; Refill: 1 -     Levothyroxine Sodium; Take 1 tablet (88 mcg total) by mouth daily before breakfast.  Dispense: 90 tablet; Refill: 1 -     Losartan Potassium; Take 1 tablet (100 mg total) by mouth daily.  Dispense: 90 tablet; Refill: 1 -     metFORMIN HCl; Take 1 tablet (500 mg total) by mouth 2 (two) times daily with a meal.  Dispense: 180 tablet; Refill: 1 -     Omeprazole; TAKE 1 CAPSULE BY MOUTH IN THE MORNING AND 1 CAPSULE IN THE EVENING  Dispense: 180 capsule; Refill: 3 -     Rosuvastatin Calcium; Take 1 tablet (40 mg total) by mouth daily.  Dispense: 90 tablet; Refill: 1     I provided 30 minutes of face-to-face time during this encounter reviewing patient's last visit with me, patient's  most recent visit with cardiology,  nephrology,  and neurology,  recent surgical and non surgical procedures, previous  labs and imaging studies, counseling on currently addressed issues,  and post visit ordering to diagnostics and therapeutics .   Follow-up: No follow-ups on file.   [name], MD

## 2023-04-11 NOTE — Patient Instructions (Signed)
Increase your gabapentin to 900 mg at bedtime as a trial  Increase your furosemide to once daily  Increase carvedilol to 25 mg twice daily for your blood pressure

## 2023-04-12 ENCOUNTER — Telehealth: Payer: Self-pay

## 2023-04-12 DIAGNOSIS — Z860100 Personal history of colon polyps, unspecified: Secondary | ICD-10-CM

## 2023-04-12 LAB — LIPID PANEL
Cholesterol: 91 mg/dL (ref 0–200)
HDL: 34.7 mg/dL — ABNORMAL LOW (ref >39.00)
LDL Cholesterol: 26 mg/dL (ref 0–99)
NonHDL: 56.41
Total CHOL/HDL Ratio: 3
Triglycerides: 151 mg/dL — ABNORMAL HIGH (ref 0.0–149.0)
VLDL: 30.2 mg/dL (ref 0.0–40.0)

## 2023-04-12 LAB — COMPREHENSIVE METABOLIC PANEL
ALT: 25 U/L (ref 0–53)
AST: 21 U/L (ref 0–37)
Albumin: 4.3 g/dL (ref 3.5–5.2)
Alkaline Phosphatase: 101 U/L (ref 39–117)
BUN: 14 mg/dL (ref 6–23)
CO2: 31 meq/L (ref 19–32)
Calcium: 8.7 mg/dL (ref 8.4–10.5)
Chloride: 101 meq/L (ref 96–112)
Creatinine, Ser: 1.05 mg/dL (ref 0.40–1.50)
GFR: 78.29 mL/min (ref >60.00)
Glucose, Bld: 114 mg/dL — ABNORMAL HIGH (ref 70–99)
Potassium: 4.5 meq/L (ref 3.5–5.1)
Sodium: 138 meq/L (ref 135–145)
Total Bilirubin: 0.4 mg/dL (ref 0.2–1.2)
Total Protein: 6.5 g/dL (ref 6.0–8.3)

## 2023-04-12 LAB — CBC WITH DIFFERENTIAL/PLATELET
Basophils Absolute: 0 10*3/uL (ref 0.0–0.1)
Basophils Relative: 0.8 % (ref 0.0–3.0)
Eosinophils Absolute: 0.3 10*3/uL (ref 0.0–0.7)
Eosinophils Relative: 5.1 % — ABNORMAL HIGH (ref 0.0–5.0)
HCT: 41.4 % (ref 39.0–52.0)
Hemoglobin: 13.7 g/dL (ref 13.0–17.0)
Lymphocytes Relative: 19.2 % (ref 12.0–46.0)
Lymphs Abs: 1 10*3/uL (ref 0.7–4.0)
MCHC: 33.1 g/dL (ref 30.0–36.0)
MCV: 90.3 fL (ref 78.0–100.0)
Monocytes Absolute: 0.4 10*3/uL (ref 0.1–1.0)
Monocytes Relative: 8.5 % (ref 3.0–12.0)
Neutro Abs: 3.4 10*3/uL (ref 1.4–7.7)
Neutrophils Relative %: 66.4 % (ref 43.0–77.0)
Platelets: 208 10*3/uL (ref 150.0–400.0)
RBC: 4.58 Mil/uL (ref 4.22–5.81)
RDW: 15.2 % (ref 11.5–15.5)
WBC: 5.1 10*3/uL (ref 4.0–10.5)

## 2023-04-12 LAB — HEMOGLOBIN A1C: Hgb A1c MFr Bld: 6.9 % — ABNORMAL HIGH (ref 4.6–6.5)

## 2023-04-12 LAB — TSH: TSH: 1.81 u[IU]/mL (ref 0.35–5.50)

## 2023-04-12 LAB — MICROALBUMIN / CREATININE URINE RATIO
Creatinine,U: 70.3 mg/dL
Microalb Creat Ratio: 1 mg/g (ref 0.0–30.0)
Microalb, Ur: <0.7 mg/dL (ref 0.0–1.9)

## 2023-04-12 LAB — LDL CHOLESTEROL, DIRECT: Direct LDL: 49 mg/dL

## 2023-04-12 MED ORDER — NA SULFATE-K SULFATE-MG SULF 17.5-3.13-1.6 GM/177ML PO SOLN: 1.0000 | Freq: Once | ORAL | 0 refills | Status: AC

## 2023-04-12 NOTE — Assessment & Plan Note (Signed)
Increase furosemide to 20 mg daily

## 2023-04-12 NOTE — Telephone Encounter (Signed)
Gastroenterology Pre-Procedure Review  Request Date: TBD Requesting Physician: Dr. Jodelle Gross  PATIENT REVIEW QUESTIONS: The patient responded to the following health history questions as indicated:    Pt needs to discuss dates with his son and will call back to schedule.  1. Are you having any GI issues? no 2. Do you have a personal history of Polyps? yes (last colonoscopy performed at [facility] 04/08/20 by Dr. Adela Lank recommended repeat in 3 years) 3. Do you have a family history of Colon Cancer or Polyps? no 4. Diabetes Mellitus? yes (patient is diabetic takes Metformin, Glipizide, and Jardiance) 5. Joint replacements in the past 12 months?no 6. Major health problems in the past 3 months?no 7. Any artificial heart valves, MVP, or defibrillator?no 8. Cardiac history? Yes Clearance to be sent upon scheduling procedure.    MEDICATIONS & ALLERGIES:    Patient reports the following regarding taking any anticoagulation/antiplatelet therapy:   Plavix, Coumadin, Eliquis, Xarelto, Lovenox, Pradaxa, Brilinta, or Effient? no Aspirin? yes (81 mg)  Patient confirms/reports the following medications:  Current Outpatient Medications  Medication Sig Dispense Refill   albuterol (VENTOLIN HFA) 108 (90 Base) MCG/ACT inhaler INHALE 2 PUFFS BY MOUTH EVERY 6 HOURS AS NEEDED FOR WHEEZING OR SHORTNESS OF BREATH 9 g 0   amLODipine (NORVASC) 5 MG tablet Take 1 tablet (5 mg total) by mouth daily. 90 tablet 3   ARIPiprazole (ABILIFY) 5 MG tablet Take one tab at bed time 90 tablet 0   aspirin EC 81 MG tablet Take 1 tablet (81 mg total) by mouth daily at 6 PM. Swallow whole. 30 tablet 12   benzonatate (TESSALON) 200 MG capsule Take 1 capsule (200 mg total) by mouth 2 (two) times daily as needed for cough. 20 capsule 0   Blood Glucose Monitoring Suppl (ACCU-CHEK AVIVA PLUS) w/Device KIT Use to check blood sugars up to 4 times daily. ICD-10 E11.40 1 kit 0   buPROPion (WELLBUTRIN XL) 300 MG 24 hr tablet Take 1  tablet (300 mg total) by mouth daily. 90 tablet 0   busPIRone (BUSPAR) 15 MG tablet Take 1 tablet (15 mg total) by mouth 3 (three) times daily. 270 tablet 3   carvedilol (COREG) 25 MG tablet Take 1 tablet (25 mg total) by mouth 2 (two) times daily. 180 tablet 2   celecoxib (CELEBREX) 100 MG capsule Take 1 capsule by mouth twice daily 60 capsule 0   clonazePAM (KLONOPIN) 1 MG tablet Take 1 tablet (1 mg total) by mouth 2 (two) times daily as needed. for anxiety 60 tablet 2   dicyclomine (BENTYL) 20 MG tablet Take 1 tablet (20 mg total) by mouth every 6 (six) hours. 360 tablet 0   docusate sodium (COLACE) 100 MG capsule Take 1 capsule (100 mg total) by mouth 2 (two) times daily. 10 capsule 0   empagliflozin (JARDIANCE) 10 MG TABS tablet Take 10 mg by mouth daily.     ezetimibe (ZETIA) 10 MG tablet Take 1 tablet (10 mg total) by mouth daily. 90 tablet 3   folic acid (FOLVITE) 1 MG tablet Take 1 tablet (1 mg total) by mouth daily. 90 tablet 0   furosemide (LASIX) 20 MG tablet Take 1 tablet (20 mg total) by mouth daily. 90 tablet 1   gabapentin (NEURONTIN) 300 MG capsule Take 1 capsule (300 mg total) by mouth 3 (three) times daily. 810 capsule 1   glipiZIDE 2.5 MG TABS Take 1 tablet by mouth 2 (two) times daily before a meal. 180 tablet 1  glucose blood (ACCU-CHEK AVIVA PLUS) test strip Use to check blood sugars up to 4 times daily. ICD-10 E11.40 400 each 0   hydrALAZINE (APRESOLINE) 25 MG tablet Take 1 tablet (25 mg total) by mouth 3 (three) times daily. 270 tablet 1   HYDROcodone-acetaminophen (NORCO) 10-325 MG tablet Take 1 tablet by mouth 2 (two) times daily as needed. 60 tablet 0   isosorbide mononitrate (IMDUR) 30 MG 24 hr tablet Take 1 tablet by mouth once daily 90 tablet 0   Lancets (ACCU-CHEK MULTICLIX) lancets Use to to check blood sugars up to 4 times daily. ICD-10: E11.40 400 each 0   levothyroxine (SYNTHROID) 88 MCG tablet Take 1 tablet (88 mcg total) by mouth daily before breakfast. 90  tablet 1   losartan (COZAAR) 100 MG tablet Take 1 tablet (100 mg total) by mouth daily. 90 tablet 1   metFORMIN (GLUCOPHAGE) 500 MG tablet Take 1 tablet (500 mg total) by mouth 2 (two) times daily with a meal. 180 tablet 1   methocarbamol (ROBAXIN) 500 MG tablet Take 1 tablet (500 mg total) by mouth every 8 (eight) hours as needed. 90 tablet 2   Multiple Vitamin (MULTIVITAMIN WITH MINERALS) TABS tablet Take 1 tablet by mouth daily.     naloxone (NARCAN) nasal spray 4 mg/0.1 mL Use as needed for excessive sedation /opioid overdose 1 each 1   omeprazole (PRILOSEC) 40 MG capsule TAKE 1 CAPSULE BY MOUTH IN THE MORNING AND 1 CAPSULE IN THE EVENING 180 capsule 3   promethazine (PHENERGAN) 12.5 MG tablet Take 1 tablet (12.5 mg total) by mouth every 4 (four) hours as needed for nausea or vomiting. (Patient taking differently: Take 12.5 mg by mouth every 8 (eight) hours as needed for nausea or vomiting.) 20 tablet 0   rosuvastatin (CRESTOR) 40 MG tablet Take 1 tablet (40 mg total) by mouth daily. 90 tablet 1   traZODone (DESYREL) 100 MG tablet TAKE 1 & 1/2 (ONE & ONE-HALF) TABLETS BY MOUTH AT BEDTIME 135 tablet 1   No current facility-administered medications for this visit.    Patient confirms/reports the following allergies:  Allergies  Allergen Reactions   Ambien [Zolpidem] Swelling and Other (See Comments)    Swelling in the throat    No orders of the defined types were placed in this encounter.   AUTHORIZATION INFORMATION Primary Insurance: 1D#: Group #:  Secondary Insurance: 1D#: Group #:  SCHEDULE INFORMATION: Date: TBD Time: Location: [facility]

## 2023-04-12 NOTE — Assessment & Plan Note (Signed)
Not at goal despite 5 medications.  Increasing carvedilol to 25 mg bid.

## 2023-04-12 NOTE — Assessment & Plan Note (Signed)
Aggravated by his mother's current state /diagnosis of dementia and increased responsibilities i His anxiety is managed with scheduled clonazepam, buspirone And trazodone 100 mg qhs.  Will encourage him to consider actively trying to reduce his dependance on  Clonazepam.  Naloxone prescription sent to [facility] given concurrent use of hydrocodone Refill history confirmed via  Controlled Substance databas, accessed by me today.Marland Kitchen

## 2023-04-16 ENCOUNTER — Telehealth: Payer: Self-pay

## 2023-04-16 MED ORDER — GABAPENTIN 300 MG PO CAPS: 900.0000 mg | ORAL_CAPSULE | Freq: Three times a day (TID) | ORAL | 0 refills | Status: DC

## 2023-04-16 NOTE — Telephone Encounter (Signed)
Pharmacy has sent a fax asking for clarification on the Gabapentin. The rx states to take 1 capsule TID but the quantity is 810 capsules. They are wanting to know if the directions need to say 3 capsules in the morning, 2 capsules in the evening and 2 capsules at bedtime.

## 2023-04-16 NOTE — Addendum Note (Signed)
Addended by: [name] on: 04/16/2023 07:34 PM   Modules accepted: Orders

## 2023-04-17 ENCOUNTER — Telehealth: Payer: Self-pay

## 2023-04-17 NOTE — Telephone Encounter (Signed)
   Name: [name]  DOB: December 26, 1965  MRN: [id number]  Primary Cardiologist: Julien Nordmann, MD  Chart reviewed as part of pre-operative protocol coverage. Because of [name]'s past medical history and time since last visit, he will require a follow-up in-office visit in order to better assess preoperative cardiovascular risk.  Pre-op covering staff: - Please schedule appointment and call patient to inform them. If patient already had an upcoming appointment within acceptable timeframe, please add "pre-op clearance" to the appointment notes so provider is aware. - Please contact requesting surgeon's office via preferred method (i.e, phone, fax) to inform them of need for appointment prior to surgery.  Denyce Robert, NP  04/17/2023, 10:29 AM

## 2023-04-17 NOTE — Telephone Encounter (Signed)
   Pre-operative Risk Assessment    Patient Name: [name]  DOB: December 08, 1965 MRN: [id number]   LOV: 04/25/22: DR Mariah Milling NOV: NONE    Request for Surgical Clearance    Procedure:   COLONOSCOPY  Date of Surgery:  Clearance TBD                                 Surgeon:  TBD Surgeon's Group or Practice Name:  [facility]  Phone number:  [phone] Fax number:  [phone]   Type of Clearance Requested:   - Medical    Type of Anesthesia:  General    Additional requests/questions:    SignedMichaelle Copas   04/17/2023, 10:13 AM

## 2023-04-17 NOTE — Telephone Encounter (Signed)
noted 

## 2023-04-23 ENCOUNTER — Telehealth: Payer: Self-pay | Admitting: Gastroenterology

## 2023-04-23 NOTE — Telephone Encounter (Signed)
Patient called and stated that he received a letter to schedule for another colonoscopy. Patient stated that he is going to [facility] and is getting his colonoscopy done. Patient wishes to longer be contacted by [place].

## 2023-04-23 NOTE — Telephone Encounter (Signed)
Patient called office to discuss additional dates for his colonoscopy because his son is off on [place].  Dates discussed 01/22, 01/23 and 01/29, 01/30.  He will call me back to let me know what date will work for his son.    His cardiology appt is scheduled 05/08/23.  Thanks,  [place], [place]

## 2023-04-27 ENCOUNTER — Telehealth: Payer: Self-pay

## 2023-04-27 NOTE — Telephone Encounter (Signed)
 The patient called in and left a voicemail requesting for [place] to call her back. He wants to schedule his colonoscopy. I sent the message to [place].

## 2023-04-30 ENCOUNTER — Other Ambulatory Visit: Payer: Self-pay

## 2023-04-30 DIAGNOSIS — Z860100 Personal history of colon polyps, unspecified: Secondary | ICD-10-CM

## 2023-04-30 NOTE — Telephone Encounter (Signed)
 Call has been returned.  Pts colonoscopy has been scheduled 05/23/23 with Dr. Servando Snare at [facility].    Referral updated.  New instructions sent to patient.  He already has his rx.  Thanks,  [place], [place]

## 2023-05-04 ENCOUNTER — Other Ambulatory Visit: Payer: Self-pay | Admitting: Surgical

## 2023-05-06 ENCOUNTER — Encounter: Payer: Self-pay | Admitting: Internal Medicine

## 2023-05-07 NOTE — Progress Notes (Addendum)
 Cardiology Clinic Note   Date: 05/08/2023 ID: KWABENA STRUTZ, DOB 1965/09/28, MRN [id number]  Primary Cardiologist:  Evalene Lunger, MD  Patient Profile    [name] is a 58 y.o. male who presents to the clinic today for routine follow up and preoperative evaluation.     Past medical history significant for: Nonobstructive CAD. LHC 08/27/2015: Mid LCx 30%.  Coronary CTA 02/17/2019: Coronary calcium  score 50 (82nd percentile). Nonobstructive disease LAD and RCA. Suspected mild stenosis mid LCx.  Echo 10/04/2020: EF 65 to 70%.  No RWMA.  Grade II DD. Normal RV size/function.  Normal PA pressure.  Mild RAE.  Trivial MR/AI. Hypertension.  Hyperlipidemia.  Lipid panel 04/11/2023: LDL 49, HDL 35, TG 151, total 91. OSA.  GERD.  T2DM. Hypothyroidism.  DVT.   In summary, patient was initially followed by Dr. Bensimhon for CAD. He had a heart catheterization for persistent chest pain that demonstrated normal coronary arteries. In May 2017 he had several episodes of exertional chest pain with diaphoresis for which he was evaluated at [facility] ED. He underwent LHC with Dr. Cherrie which showed minimal CAD as detailed above. Coronary CTA in October 2020 showed nonobstructive disease as detailed above. He was seen by Dr. Pietro in May 2022 for preoperative evaluation prior to knee replacement. At that time he was doing well with complaints. He was seen in follow up in August 2023 with complaints of lower extremity edema that began after starting amlodipine . Metoprolol  and amlodipine  was stopped and patient was placed on carvedilol .      History of Present Illness    [name] is followed by Dr. Gollan for the above outlined history.   Patient was last seen in the office by Dr. Gollan on 04/25/2022 for routine follow up. Patient reported continued chest pain. He was started on isosorbide  for possible coronary spasm.   Today, patient reports chest pain has improved since starting  isosorbide . He will still get occasional twinges of pain but they are brief and not as bothersome. His biggest complaint today is shortness of breath. He reports for the last 2-3 months he is awakened in the middle of the night with shortness of breath causing him to sit up to catch his breath then needing to prop up on extra pillows to go back to sleep. This can occur 3 or more times a week. He typically starts out the night on just one pillow. He reports chronic lower extremity edema that has not changed and is best in the morning and progresses throughout the day. He saw his PCP last month and Lasix  was increased from 3 times a week dosing to daily dosing. He reports brisk diuresis on current dose of Lasix . His weight has increased 23 lb since November. He does not have a scale at home to weigh. Last weight 12/18 was 248 lb and today it is 257 lb. He is mostly sedentary secondary to chronic injuries after a work accident.  He is able to light to moderate household activities without shortness of breath or chest pain. He has not noticed a decrease in activity tolerance.     ROS: All other systems reviewed and are otherwise negative except as noted in History of Present Illness.  EKGs/Labs Reviewed    EKG Interpretation Date/Time:  Tuesday May 08 2023 14:50:04 EST Ventricular Rate:  76 PR Interval:  156 QRS Duration:  114 QT Interval:  428 QTC Calculation: 481 R Axis:   -81  Text Interpretation:  Sinus rhythm with occasional Premature ventricular complexes Left axis deviation Pulmonary disease pattern Nonspecific T wave abnormality When compared with ECG of 07/26/2022 (not in [facility]) no significant changes Confirmed by Loistine Sober [phone]) on 05/08/2023 2:56:54 PM   04/11/2023: ALT 25; AST 21; BUN 14; Creatinine, Ser 1.05; Potassium 4.5; Sodium 138   04/11/2023: Hemoglobin 13.7; WBC 5.1   04/11/2023: TSH 1.81   08/16/2022: B Natriuretic Peptide 187.7       Physical Exam    VS:  BP  128/62 (BP Location: Left Arm, Patient Position: Sitting, Cuff Size: Large)   Pulse 76   Ht 6' (1.829 m)   Wt 257 lb 6.4 oz (116.8 kg)   SpO2 93%   BMI 34.91 kg/m  , BMI Body mass index is 34.91 kg/m.  GEN: Well nourished, well developed, in no acute distress. Neck: No JVD or carotid bruits. Cardiac:  RRR. No murmurs. No rubs or gallops.   Respiratory:  Respirations regular and unlabored. Clear to auscultation without rales, wheezing or rhonchi. GI: Soft, nontender, nondistended. Extremities: Radials/DP/PT 2+ and equal bilaterally. No clubbing or cyanosis. 1+ pitting edema bilateral lower extremities.   Skin: Warm and dry, no rash. Neuro: Strength intact.  Assessment & Plan   Nonobstructive CAD  S/p LHC May 2017 demonstrating minimal CAD. Coronary CTA October 2020 showed calcium  score of 50 and nonobstructive CAD. Patient reports infrequent episodes of chest pain since starting isosorbide  lasts year.  -Continue amlodipine , carvedilol , isosorbide , rosuvastatin , Zetia .   Paroxysmal nocturnal dyspnea Patient reports a 2-3 month history of being awakened in the middle of the night with shortness of breath causing him to sit up to catch his breath then prop up with pillows to return to sleep. This occurs three or more times a week. He normally starts off sleeping on just 1 pillow. He has chronic lower extremity edema that is best in the morning and progresses throughout the day. He does not have any shortness of breath at rest or with light to moderate household activities. His PCP increased his Lasix  from 3 times a week dosing to daily dosing on 12/18. He feels this has helped his symptoms. 1+ pitting edema bilateral lower extremities. Lungs clear to auscultation.  -BMP today.  -Schedule echo.  -Continue daily Lasix .   Hypertension BP today 128/62. No report of headaches or dizziness.  -Continue losartan , isosorbide , hydralazine , carvedilol , amlodipine .   Hyperlipidemia LDL December  2024 49, at goal.  -Continue atorvastatin  and Zetia .   Preoperative cardiovascular risk assessment Patient is pending colonoscopy on 05/23/2023.  According to the RCRI, patient has a 0.4% risk of MACE. Patient reports activity equivalent to >4.0 METS (able to perform light to moderate household activities). Patient has new PND as detailed above.  -Will finalize cardiac clearance for colonoscopy after echo.   ADDENDUM: Echo showed normal LV/RV function with normal diastolic parameters, no RWMA and no significant valvular abnormalities. Patient is an acceptable risk for planned procedure without further cardiac testing.   Disposition: BMP today. Echo for DOE. Return in 8-10 weeks or sooner as needed. Will send finalized cardiac clearance for colonoscopy after echo (scheduled on 1/27).          Signed, Sober HERO. Margery Szostak, DNP, NP-C

## 2023-05-07 NOTE — Telephone Encounter (Signed)
 Lov: 04/11/23  Nov: 07/11/23  E-Prescribing Status: Receipt confirmed by pharmacy (04/11/2023  4:23 PM EST)

## 2023-05-08 ENCOUNTER — Ambulatory Visit: Payer: Medicare HMO | Attending: Student | Admitting: Student

## 2023-05-08 ENCOUNTER — Encounter: Payer: Self-pay | Admitting: Student

## 2023-05-08 ENCOUNTER — Other Ambulatory Visit: Payer: Self-pay

## 2023-05-08 VITALS — BP 128/62 | HR 76 | Ht 72.0 in | Wt 257.4 lb

## 2023-05-08 DIAGNOSIS — E1169 Type 2 diabetes mellitus with other specified complication: Secondary | ICD-10-CM

## 2023-05-08 DIAGNOSIS — E785 Hyperlipidemia, unspecified: Secondary | ICD-10-CM | POA: Diagnosis not present

## 2023-05-08 DIAGNOSIS — I10 Essential (primary) hypertension: Secondary | ICD-10-CM | POA: Diagnosis not present

## 2023-05-08 DIAGNOSIS — I2510 Atherosclerotic heart disease of native coronary artery without angina pectoris: Secondary | ICD-10-CM | POA: Diagnosis not present

## 2023-05-08 DIAGNOSIS — Z01810 Encounter for preprocedural cardiovascular examination: Secondary | ICD-10-CM | POA: Diagnosis not present

## 2023-05-08 DIAGNOSIS — R0600 Dyspnea, unspecified: Secondary | ICD-10-CM | POA: Diagnosis not present

## 2023-05-08 MED ORDER — HYDROCODONE-ACETAMINOPHEN 10-325 MG PO TABS: 1.0000 | ORAL_TABLET | Freq: Two times a day (BID) | ORAL | 0 refills | Status: DC | PRN

## 2023-05-08 NOTE — Patient Instructions (Signed)
 Medication Instructions:  Your physician recommends that you continue on your current medications as directed. Please refer to the Current Medication list given to you today.  *If you need a refill on your cardiac medications before your next appointment, please call your pharmacy*  Lab Work: TODAY: BMET  If you have labs (blood work) drawn today and your tests are completely normal, you will receive your results only by: MyChart Message (if you have MyChart) OR A paper copy in the mail If you have any lab test that is abnormal or we need to change your treatment, we will call you to review the results.   Testing/Procedures: Your physician has requested that you have an echocardiogram. Echocardiography is a painless test that uses sound waves to create images of your heart. It provides your doctor with information about the size and shape of your heart and how well your heart's chambers and valves are working. This procedure takes approximately one hour. There are no restrictions for this procedure. Please do NOT wear cologne, perfume, aftershave, or lotions (deodorant is allowed). Please arrive 15 minutes prior to your appointment time.  Please note: We ask at that you not bring children with you during ultrasound (echo/ vascular) testing. Due to room size and safety concerns, children are not allowed in the ultrasound rooms during exams. Our front office staff cannot provide observation of children in our lobby area while testing is being conducted. An adult accompanying a patient to their appointment will only be allowed in the ultrasound room at the discretion of the ultrasound technician under special circumstances. We apologize for any inconvenience.  Follow-Up: At [facility], you and your health needs are our priority.  As part of our continuing mission to provide you with exceptional heart care, we have created designated Provider Care Teams.  These Care Teams include your  primary Cardiologist (physician) and Advanced Practice Providers (APPs -  Physician Assistants and Nurse Practitioners) who all work together to provide you with the care you need, when you need it.  Your next appointment:   10 weeks  Provider:   You may see Timothy Gollan, MD or one of the following Advanced Practice Providers on your designated Care Team:   Lonni Meager, NP Bernardino Bring, PA-C Cadence Franchester, PA-C Tylene Lunch, NP Barnie Hila, NP

## 2023-05-09 LAB — BASIC METABOLIC PANEL
BUN/Creatinine Ratio: 17 (ref 9–20)
BUN: 18 mg/dL (ref 6–24)
CO2: 20 mmol/L (ref 20–29)
Calcium: 9 mg/dL (ref 8.7–10.2)
Chloride: 103 mmol/L (ref 96–106)
Creatinine, Ser: 1.05 mg/dL (ref 0.76–1.27)
Glucose: 132 mg/dL — ABNORMAL HIGH (ref 70–99)
Potassium: 4.1 mmol/L (ref 3.5–5.2)
Sodium: 141 mmol/L (ref 134–144)
eGFR: 82 mL/min/{1.73_m2} (ref >59)

## 2023-05-10 NOTE — Telephone Encounter (Signed)
Preoperative cardiovascular risk assessment Patient is pending colonoscopy on 05/23/2023.  According to the RCRI, patient has a 0.4% risk of MACE. Patient reports activity equivalent to >4.0 METS (able to perform light to moderate household activities). Patient has new PND as detailed above.  -Will finalize cardiac clearance for colonoscopy after echo.    Disposition: BMP today. Echo for DOE. Return in 8-10 weeks or sooner as needed. Will send finalized cardiac clearance for colonoscopy after echo (scheduled on 1/27).

## 2023-05-11 ENCOUNTER — Telehealth: Payer: Self-pay

## 2023-05-11 NOTE — Telephone Encounter (Signed)
The patient called in to speak with Marcelino Duster. The patient said that he has left two voicemails over 48 hours ago, and she never gotten a call back. Please call the patient.

## 2023-05-11 NOTE — Telephone Encounter (Signed)
R/S Colonoscopy from 01/29 to 02/06 due to pending Echo to be done on 01/27.  Trish in Endo notified.  Instructions updated.  Referral updated.   Thanks,  [place], [place]

## 2023-05-21 ENCOUNTER — Ambulatory Visit: Payer: Medicare HMO | Attending: Student

## 2023-05-21 DIAGNOSIS — R0600 Dyspnea, unspecified: Secondary | ICD-10-CM

## 2023-05-21 DIAGNOSIS — E1169 Type 2 diabetes mellitus with other specified complication: Secondary | ICD-10-CM | POA: Diagnosis not present

## 2023-05-21 DIAGNOSIS — E785 Hyperlipidemia, unspecified: Secondary | ICD-10-CM

## 2023-05-21 LAB — ECHOCARDIOGRAM COMPLETE
AV Mean grad: 8 mm[Hg]
AV Peak grad: 14.6 mm[Hg]
Ao pk vel: 1.91 m/s
Area-P 1/2: 4.39 cm2
S' Lateral: 4.7 cm

## 2023-05-21 MED ORDER — PERFLUTREN LIPID MICROSPHERE
1.0000 mL | INTRAVENOUS | Status: AC | PRN
  Administered 2023-05-21: 3 mL via INTRAVENOUS

## 2023-05-22 NOTE — Telephone Encounter (Signed)
Received addendum  Preop risk assessment from [facility] this morning 05/22/23.  Please see below addendum from office visit dated 05/08/23:  Preoperative cardiovascular risk assessment Patient is pending colonoscopy on 05/23/2023.  According to the RCRI, patient has a 0.4% risk of MACE. Patient reports activity equivalent to >4.0 METS (able to perform light to moderate household activities). Patient has new PND as detailed above.  -Will finalize cardiac clearance for colonoscopy after echo.    ADDENDUM: Echo showed normal LV/RV function with normal diastolic parameters, no RWMA and no significant valvular abnormalities. Patient is an acceptable risk for planned procedure without further cardiac testing.

## 2023-05-23 ENCOUNTER — Other Ambulatory Visit: Payer: Self-pay | Admitting: Emergency Medicine

## 2023-05-28 ENCOUNTER — Encounter: Payer: Self-pay | Admitting: Emergency Medicine

## 2023-05-31 ENCOUNTER — Encounter: Payer: Self-pay | Admitting: Gastroenterology

## 2023-05-31 ENCOUNTER — Other Ambulatory Visit: Payer: Self-pay

## 2023-05-31 ENCOUNTER — Ambulatory Visit: Payer: Medicare HMO | Admitting: Anesthesiology

## 2023-05-31 ENCOUNTER — Ambulatory Visit
Admission: RE | Admit: 2023-05-31 | Discharge: 2023-05-31 | Disposition: A | Discharge status: Home / Self Care | Payer: Medicare HMO | Attending: Gastroenterology | Admitting: Gastroenterology

## 2023-05-31 ENCOUNTER — Encounter
Admission: RE | Disposition: A | Discharge status: Home / Self Care | Payer: Self-pay | Source: Home / Self Care | Attending: Gastroenterology

## 2023-05-31 DIAGNOSIS — K5730 Diverticulosis of large intestine without perforation or abscess without bleeding: Secondary | ICD-10-CM | POA: Insufficient documentation

## 2023-05-31 DIAGNOSIS — K635 Polyp of colon: Secondary | ICD-10-CM

## 2023-05-31 DIAGNOSIS — F419 Anxiety disorder, unspecified: Secondary | ICD-10-CM | POA: Diagnosis not present

## 2023-05-31 DIAGNOSIS — Z1211 Encounter for screening for malignant neoplasm of colon: Secondary | ICD-10-CM | POA: Diagnosis not present

## 2023-05-31 DIAGNOSIS — F32A Depression, unspecified: Secondary | ICD-10-CM | POA: Insufficient documentation

## 2023-05-31 DIAGNOSIS — I2510 Atherosclerotic heart disease of native coronary artery without angina pectoris: Secondary | ICD-10-CM | POA: Diagnosis not present

## 2023-05-31 DIAGNOSIS — Z7984 Long term (current) use of oral hypoglycemic drugs: Secondary | ICD-10-CM | POA: Insufficient documentation

## 2023-05-31 DIAGNOSIS — G4730 Sleep apnea, unspecified: Secondary | ICD-10-CM | POA: Insufficient documentation

## 2023-05-31 DIAGNOSIS — Z6831 Body mass index (BMI) 31.0-31.9, adult: Secondary | ICD-10-CM | POA: Insufficient documentation

## 2023-05-31 DIAGNOSIS — E119 Type 2 diabetes mellitus without complications: Secondary | ICD-10-CM | POA: Diagnosis not present

## 2023-05-31 DIAGNOSIS — Z87891 Personal history of nicotine dependence: Secondary | ICD-10-CM | POA: Diagnosis not present

## 2023-05-31 DIAGNOSIS — Z79890 Hormone replacement therapy (postmenopausal): Secondary | ICD-10-CM | POA: Diagnosis not present

## 2023-05-31 DIAGNOSIS — E039 Hypothyroidism, unspecified: Secondary | ICD-10-CM | POA: Diagnosis not present

## 2023-05-31 DIAGNOSIS — Z791 Long term (current) use of non-steroidal anti-inflammatories (NSAID): Secondary | ICD-10-CM | POA: Diagnosis not present

## 2023-05-31 DIAGNOSIS — K640 First degree hemorrhoids: Secondary | ICD-10-CM | POA: Insufficient documentation

## 2023-05-31 DIAGNOSIS — D125 Benign neoplasm of sigmoid colon: Secondary | ICD-10-CM | POA: Diagnosis not present

## 2023-05-31 DIAGNOSIS — E669 Obesity, unspecified: Secondary | ICD-10-CM | POA: Insufficient documentation

## 2023-05-31 DIAGNOSIS — E785 Hyperlipidemia, unspecified: Secondary | ICD-10-CM | POA: Insufficient documentation

## 2023-05-31 DIAGNOSIS — Z7982 Long term (current) use of aspirin: Secondary | ICD-10-CM | POA: Diagnosis not present

## 2023-05-31 DIAGNOSIS — Z79899 Other long term (current) drug therapy: Secondary | ICD-10-CM | POA: Insufficient documentation

## 2023-05-31 DIAGNOSIS — K219 Gastro-esophageal reflux disease without esophagitis: Secondary | ICD-10-CM | POA: Insufficient documentation

## 2023-05-31 DIAGNOSIS — I10 Essential (primary) hypertension: Secondary | ICD-10-CM | POA: Insufficient documentation

## 2023-05-31 DIAGNOSIS — Z860100 Personal history of colon polyps, unspecified: Secondary | ICD-10-CM

## 2023-05-31 HISTORY — PX: POLYPECTOMY: SHX5525

## 2023-05-31 HISTORY — PX: COLONOSCOPY WITH PROPOFOL: SHX5780

## 2023-05-31 LAB — GLUCOSE, CAPILLARY: Glucose-Capillary: 126 mg/dL — ABNORMAL HIGH (ref 70–99)

## 2023-05-31 SURGERY — COLONOSCOPY WITH PROPOFOL
Anesthesia: General

## 2023-05-31 MED ORDER — LIDOCAINE HCL (PF) 2 % IJ SOLN
INTRAMUSCULAR | Status: AC
Start: 2023-05-31 — End: ?
  Filled 2023-05-31: qty 5

## 2023-05-31 MED ORDER — DEXMEDETOMIDINE HCL IN NACL 400 MCG/100ML IV SOLN
INTRAVENOUS | Status: DC | PRN
  Administered 2023-05-31: 8 ug via INTRAVENOUS

## 2023-05-31 MED ORDER — SODIUM CHLORIDE 0.9 % IV SOLN: INTRAVENOUS | Status: DC

## 2023-05-31 MED ORDER — LIDOCAINE HCL (CARDIAC) PF 100 MG/5ML IV SOSY
PREFILLED_SYRINGE | INTRAVENOUS | Status: DC | PRN
  Administered 2023-05-31: 100 mg via INTRAVENOUS

## 2023-05-31 MED ORDER — PROPOFOL 500 MG/50ML IV EMUL
INTRAVENOUS | Status: DC | PRN
  Administered 2023-05-31: 80 mg via INTRAVENOUS
  Administered 2023-05-31: 12 ug/kg/min via INTRAVENOUS

## 2023-05-31 MED ORDER — PROPOFOL 10 MG/ML IV BOLUS
INTRAVENOUS | Status: AC
Start: 2023-05-31 — End: ?
  Filled 2023-05-31: qty 40

## 2023-05-31 NOTE — Anesthesia Preprocedure Evaluation (Addendum)
 Anesthesia Evaluation  Patient identified by MRN, date of birth, ID band Patient awake    Reviewed: Allergy & Precautions, NPO status , Patient's Chart, lab work & pertinent test results  History of Anesthesia Complications Negative for: history of anesthetic complications  Airway Mallampati: I   Neck ROM: Full    Dental  (+) Missing   Pulmonary sleep apnea and Continuous Positive Airway Pressure Ventilation , former smoker (quit 2007)   Pulmonary exam normal breath sounds clear to auscultation       Cardiovascular hypertension, + CAD (nonobstructive)  Normal cardiovascular exam Rhythm:Regular Rate:Normal  ECG 05/08/23:  Sinus rhythm with occasional Premature ventricular complexes Left axis deviation Pulmonary disease pattern Nonspecific T wave abnormality When compared with ECG of 07/26/2022 (not in [facility]) no significant changes   Neuro/Psych  PSYCHIATRIC DISORDERS Anxiety Depression    Alcohol use disorder, drinks 2-3 times per week  Neuromuscular disease (neuropathy)    GI/Hepatic ,GERD  ,,  Endo/Other  diabetes, Type 2Hypothyroidism  Obesity   Renal/GU negative Renal ROS     Musculoskeletal  (+) Arthritis ,    Abdominal   Peds  Hematology negative hematology ROS (+)   Anesthesia Other Findings Cardiology note 05/08/23:  Preoperative cardiovascular risk assessment Patient is pending colonoscopy on 05/23/2023.  According to the RCRI, patient has a 0.4% risk of MACE. Patient reports activity equivalent to >4.0 METS (able to perform light to moderate household activities). Patient has new PND as detailed above.  -Will finalize cardiac clearance for colonoscopy after echo.    ADDENDUM: Echo showed normal LV/RV function with normal diastolic parameters, no RWMA and no significant valvular abnormalities. Patient is an acceptable risk for planned procedure without further cardiac testing.    Disposition: BMP today. Echo  for DOE. Return in 8-10 weeks or sooner as needed. Will send finalized cardiac clearance for colonoscopy after echo (scheduled on 1/27).    Reproductive/Obstetrics                             Anesthesia Physical Anesthesia Plan  ASA: 3  Anesthesia Plan: General   Post-op Pain Management:    Induction: Intravenous  PONV Risk Score and Plan: 2 and Propofol  infusion, TIVA and Treatment may vary due to age or medical condition  Airway Management Planned: Natural Airway  Additional Equipment:   Intra-op Plan:   Post-operative Plan:   Informed Consent: I have reviewed the patients History and Physical, chart, labs and discussed the procedure including the risks, benefits and alternatives for the proposed anesthesia with the patient or authorized representative who has indicated his/her understanding and acceptance.       Plan Discussed with: CRNA  Anesthesia Plan Comments: (LMA/GETA backup discussed.  Patient consented for risks of anesthesia including but not limited to:  - adverse reactions to medications - damage to eyes, teeth, lips or other oral mucosa - nerve damage due to positioning  - sore throat or hoarseness - damage to heart, brain, nerves, lungs, other parts of body or loss of life  Informed patient about role of CRNA in peri- and intra-operative care.  Patient voiced understanding.)        Anesthesia Quick Evaluation

## 2023-05-31 NOTE — Anesthesia Postprocedure Evaluation (Signed)
 Anesthesia Post Note  Patient: [name]  Procedure(s) Performed: COLONOSCOPY WITH PROPOFOL  POLYPECTOMY  Patient location during evaluation: PACU Anesthesia Type: General Level of consciousness: awake and alert, oriented and patient cooperative Pain management: pain level controlled Vital Signs Assessment: post-procedure vital signs reviewed and stable Respiratory status: spontaneous breathing, nonlabored ventilation and respiratory function stable Cardiovascular status: blood pressure returned to baseline and stable Postop Assessment: adequate PO intake Anesthetic complications: no   No notable events documented.   Last Vitals:  Vitals:   05/31/23 1059 05/31/23 1109  BP: 109/66 (!) 151/91  Pulse: 67 68  Resp: 11 11  Temp:    SpO2: 96% 97%    Last Pain:  Vitals:   05/31/23 1109  TempSrc:   PainSc: 0-No pain                 Alfonso Ruths

## 2023-05-31 NOTE — Transfer of Care (Signed)
 Immediate Anesthesia Transfer of Care Note  Patient: [name]  Procedure(s) Performed: COLONOSCOPY WITH PROPOFOL  POLYPECTOMY  Patient Location: PACU  Anesthesia Type:MAC  Level of Consciousness: sedated  Airway & Oxygen Therapy: Patient Spontanous Breathing  Post-op Assessment: Report given to RN and Post -op Vital signs reviewed and stable  Post vital signs: stable  Last Vitals:  Vitals Value Taken Time  BP    Temp    Pulse 69 05/31/23 1048  Resp 12 05/31/23 1049  SpO2 94 % 05/31/23 1048  Vitals shown include unfiled device data.  Last Pain:  Vitals:   05/31/23 0951  TempSrc: Temporal         Complications: No notable events documented.

## 2023-05-31 NOTE — Op Note (Addendum)
 [facility] Gastroenterology Patient Name: [name] Procedure Date: 05/31/2023 10:13 AM MRN: [id number] Account #: [id number] Date of Birth: 12-16-1965 Admit Type: Outpatient Age: 58 Room: [facility] ENDO ROOM 4 Gender: Male Note Status: Finalized Instrument Name: Arvis [id number] Procedure:             Colonoscopy Indications:           High risk colon cancer surveillance: Personal history                         of colonic polyps Providers:             Rogelia Copping MD, MD Referring MD:          Verneita Kettering, MD (Referring MD) Medicines:             Propofol  per Anesthesia Complications:         No immediate complications. Procedure:             Pre-Anesthesia Assessment:                        - Prior to the procedure, a History and Physical was                         performed, and patient medications and allergies were                         reviewed. The patient's tolerance of previous                         anesthesia was also reviewed. The risks and benefits                         of the procedure and the sedation options and risks                         were discussed with the patient. All questions were                         answered, and informed consent was obtained. Prior                         Anticoagulants: The patient has taken no anticoagulant                         or antiplatelet agents. ASA Grade Assessment: II - A                         patient with mild systemic disease. After reviewing                         the risks and benefits, the patient was deemed in                         satisfactory condition to undergo the procedure.                        After obtaining informed consent, the colonoscope was  passed under direct vision. Throughout the procedure,                         the patient's blood pressure, pulse, and oxygen                         saturations were monitored continuously. The                          Colonoscope was introduced through the anus and                         advanced to the the cecum, identified by appendiceal                         orifice and ileocecal valve. The colonoscopy was                         performed without difficulty. The patient tolerated                         the procedure well. The quality of the bowel                         preparation was excellent. Findings:      The perianal and digital rectal examinations were normal.      Two sessile polyps were found in the sigmoid colon. The polyps were 2 to       3 mm in size. These polyps were removed with a cold snare. Resection and       retrieval were complete.      Multiple small-mouthed diverticula were found in the sigmoid colon.      Non-bleeding internal hemorrhoids were found during retroflexion. The       hemorrhoids were Grade I (internal hemorrhoids that do not prolapse). Impression:            - Two 2 to 3 mm polyps in the sigmoid colon, removed                         with a cold snare. Resected and retrieved.                        - Diverticulosis in the sigmoid colon.                        - Non-bleeding internal hemorrhoids. Recommendation:        - Discharge patient to home.                        - Resume previous diet.                        - Continue present medications.                        - Await pathology results.                        - If the pathology report reveals adenomatous tissue,  then repeat the colonoscopy for surveillance in 7                         years. Procedure Code(s):     --- Professional ---                        [phone], Colonoscopy, flexible; with removal of                         tumor(s), polyp(s), or other lesion(s) by snare                         technique Diagnosis Code(s):     --- Professional ---                        Z86.010, Personal history of colonic polyps                        D12.5, Benign neoplasm  of sigmoid colon CPT copyright 2022 American Medical Association. All rights reserved. The codes documented in this report are preliminary and upon coder review may  be revised to meet current compliance requirements. Rogelia Copping MD, MD 05/31/2023 10:44:08 AM This report has been signed electronically. Number of Addenda: 0 Note Initiated On: 05/31/2023 10:13 AM Scope Withdrawal Time: 0 hours 7 minutes 32 seconds  Total Procedure Duration: 0 hours 9 minutes 19 seconds  Estimated Blood Loss:  Estimated blood loss: none.      [facility]

## 2023-05-31 NOTE — H&P (Signed)
 Joshua Copping, MD [facility] [address], [address] [place], [place] [address] Phone:[phone] Fax : [phone]  Primary Care Physician:  Marylynn Verneita CROME, MD Primary Gastroenterologist:  Dr. Copping  Pre-Procedure History & Physical: HPI:  Joshua WESTBROOKS is a 58 y.o. male is here for an colonoscopy.   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   Anxiety    Anxiety and depression    Arthritis    knees and shoulders   Benzodiazepine withdrawal with complication (HCC) 01/09/2019   Bruit    L   Chest pain    hx   Cognitive complaints with normal neuropsychological exam 10/23/2016   Diabetes mellitus without complication (HCC)    borderline, diet controlled, no meds, patient has lost 30 lbs   Edema    Fatty liver    GERD (gastroesophageal reflux disease)    uses Omeprazole    Goiter    HLD (hyperlipidemia)    Hypertension    essential, benign   Hypothyroidism    Impotence of organic origin    Murmur    never has caused any problems   Neuromuscular disorder (HCC)    neuropathy bilateral feet   Other chest pain    tightness, pressure   Palpitation    hx   Precordial pain    Sleep apnea    uses cpap    Past Surgical History:  Procedure Laterality Date   APPENDECTOMY  2002   done at [facility]   CARDIAC CATHETERIZATION     CARDIAC CATHETERIZATION N/A 08/27/2015   Procedure: Left Heart Cath and Coronary Angiography;  Surgeon: Toribio JONELLE Fuel, MD;  Location: [facility] INVASIVE CV LAB;  Service: Cardiovascular;  Laterality: N/A;   COLONOSCOPY  in his 20's   COLONOSCOPY  2019   EAR CYST EXCISION Left 07/08/2019   Procedure: open excision baker's cyst left knee;  Surgeon: Addie Cordella Hamilton, MD;  Location: [facility] OR;  Service: Orthopedics;  Laterality: Left;   HERNIA REPAIR  05/15/2013   umbilical   JOINT REPLACEMENT     right knee   KNEE  ARTHROSCOPY Left 12/17/2018   Procedure: left knee arthroscopy, meniscal debridement, loose body removal;  Surgeon: Addie Cordella Hamilton, MD;  Location: [facility] OR;  Service: Orthopedics;  Laterality: Left;   REVERSE SHOULDER ARTHROPLASTY Right 08/10/2022   Procedure: RIGHT REVERSE SHOULDER ARTHROPLASTY, BICEPS TENODESIS;  Surgeon: Addie Cordella Hamilton, MD;  Location: [facility] OR;  Service: Orthopedics;  Laterality: Right;   REVISION TOTAL KNEE ARTHROPLASTY Right 06/23/2014   DR ADDIE   SHOULDER ARTHROSCOPY WITH ROTATOR CUFF REPAIR AND SUBACROMIAL DECOMPRESSION Right 03/07/2022   Procedure: RIGHT SHOULDER ARTHROSCOPY, DEBRIDEMENT, MINI OPEN ROTATOR CUFF TEAR REPAIR;  Surgeon: Addie Cordella Hamilton, MD;  Location: [facility] OR;  Service: Orthopedics;  Laterality: Right;   TONSILLECTOMY     TOTAL KNEE ARTHROPLASTY Right 2011   right   TOTAL KNEE ARTHROPLASTY Left 02/10/2021   Procedure: LEFT TOTAL KNEE ARTHROPLASTY;  Surgeon: Addie Cordella Hamilton, MD;  Location: [facility] OR;  Service: Orthopedics;  Laterality: Left;   TOTAL KNEE REVISION Right 06/23/2014   Procedure: TOTAL KNEE REVISION;  Surgeon: Cordella Hamilton Addie, MD;  Location: [facility] OR;  Service: Orthopedics;  Laterality: Right;   UPPER GASTROINTESTINAL ENDOSCOPY  04/30/2013   Jakie QUERY TOOTH EXTRACTION      Prior to Admission medications   Medication Sig  Start Date End Date Taking? Authorizing Provider  amLODipine  (NORVASC ) 5 MG tablet Take 1 tablet (5 mg total) by mouth daily. 04/11/23  Yes Marylynn Verneita CROME, MD  ARIPiprazole  (ABILIFY ) 5 MG tablet Take one tab at bed time 04/06/23  Yes Arfeen, Leni DASEN, MD  buPROPion  (WELLBUTRIN  XL) 300 MG 24 hr tablet Take 1 tablet (300 mg total) by mouth daily. 04/06/23  Yes Arfeen, Leni DASEN, MD  carvedilol  (COREG ) 25 MG tablet Take 1 tablet (25 mg total) by mouth 2 (two) times daily. 04/11/23  Yes Marylynn Verneita CROME, MD  empagliflozin  (JARDIANCE ) 10 MG TABS tablet Take 10 mg by mouth daily.   Yes Marylynn Verneita CROME, MD  ezetimibe   (ZETIA ) 10 MG tablet Take 1 tablet (10 mg total) by mouth daily. 04/11/23  Yes Marylynn Verneita CROME, MD  furosemide  (LASIX ) 20 MG tablet Take 1 tablet (20 mg total) by mouth daily. 04/11/23  Yes Marylynn Verneita CROME, MD  gabapentin  (NEURONTIN ) 300 MG capsule Take 3 capsules (900 mg total) by mouth 3 (three) times daily. 04/16/23 07/15/23 Yes Marylynn Verneita CROME, MD  glipiZIDE  2.5 MG TABS Take 1 tablet by mouth 2 (two) times daily before a meal. 04/11/23  Yes Marylynn Verneita CROME, MD  hydrALAZINE  (APRESOLINE ) 25 MG tablet Take 1 tablet (25 mg total) by mouth 3 (three) times daily. 04/11/23  Yes Marylynn Verneita CROME, MD  HYDROcodone -acetaminophen  (NORCO) 10-325 MG tablet Take 1 tablet by mouth 2 (two) times daily as needed. 05/08/23  Yes Marylynn Verneita CROME, MD  isosorbide  mononitrate (IMDUR ) 30 MG 24 hr tablet Take 1 tablet by mouth once daily 04/11/23  Yes Gollan, Timothy J, MD  levothyroxine  (SYNTHROID ) 88 MCG tablet Take 1 tablet (88 mcg total) by mouth daily before breakfast. 04/11/23  Yes Marylynn Verneita CROME, MD  losartan  (COZAAR ) 100 MG tablet Take 1 tablet (100 mg total) by mouth daily. 04/11/23  Yes Marylynn Verneita CROME, MD  metFORMIN  (GLUCOPHAGE ) 500 MG tablet Take 1 tablet (500 mg total) by mouth 2 (two) times daily with a meal. 04/11/23  Yes Marylynn Verneita CROME, MD  rosuvastatin  (CRESTOR ) 40 MG tablet Take 1 tablet (40 mg total) by mouth daily. 04/11/23  Yes Marylynn Verneita CROME, MD  traZODone  (DESYREL ) 100 MG tablet TAKE 1 & 1/2 (ONE & ONE-HALF) TABLETS BY MOUTH AT BEDTIME 04/11/23  Yes Tullo, Teresa L, MD  albuterol  (VENTOLIN  HFA) 108 (90 Base) MCG/ACT inhaler INHALE 2 PUFFS BY MOUTH EVERY 6 HOURS AS NEEDED FOR WHEEZING OR SHORTNESS OF BREATH 02/06/23   Douglass Caul B, FNP  aspirin  EC 81 MG tablet Take 1 tablet (81 mg total) by mouth daily at 6 PM. Swallow whole. 08/12/22   Addie Cordella Hamilton, MD  Blood Glucose Monitoring Suppl (ACCU-CHEK AVIVA PLUS) w/Device KIT Use to check blood sugars up to 4 times daily. ICD-10 E11.40 10/04/21    Marylynn Verneita CROME, MD  busPIRone  (BUSPAR ) 15 MG tablet Take 1 tablet (15 mg total) by mouth 3 (three) times daily. 04/11/23   Marylynn Verneita CROME, MD  celecoxib  (CELEBREX ) 100 MG capsule Take 1 capsule by mouth twice daily 05/04/23   Magnant, [name] L, PA-C  clonazePAM  (KLONOPIN ) 1 MG tablet Take 1 tablet (1 mg total) by mouth 2 (two) times daily as needed. for anxiety 04/11/23   Marylynn Verneita CROME, MD  dicyclomine  (BENTYL ) 20 MG tablet Take 1 tablet (20 mg total) by mouth every 6 (six) hours. 04/11/23   Marylynn Verneita CROME, MD  folic acid  (FOLVITE ) 1 MG tablet  Take 1 tablet (1 mg total) by mouth daily. 08/16/22   Addie Cordella Hamilton, MD  glucose blood (ACCU-CHEK AVIVA PLUS) test strip Use to check blood sugars up to 4 times daily. ICD-10 E11.40 01/17/22   Marylynn Verneita CROME, MD  Lancets (ACCU-CHEK MULTICLIX) lancets Use to to check blood sugars up to 4 times daily. ICD-10: E11.40 10/04/21   Marylynn Verneita CROME, MD  methocarbamol  (ROBAXIN ) 500 MG tablet Take 1 tablet (500 mg total) by mouth every 8 (eight) hours as needed. 05/26/22   Tullo, Teresa L, MD  Multiple Vitamin (MULTIVITAMIN WITH MINERALS) TABS tablet Take 1 tablet by mouth daily.    [provider]  naloxone  (NARCAN ) nasal spray 4 mg/0.1 mL Use as needed for excessive sedation /opioid overdose 09/30/21   Marylynn Verneita CROME, MD  omeprazole  (PRILOSEC) 40 MG capsule TAKE 1 CAPSULE BY MOUTH IN THE MORNING AND 1 CAPSULE IN THE EVENING 04/11/23   Marylynn Verneita CROME, MD  promethazine  (PHENERGAN ) 12.5 MG tablet Take 1 tablet (12.5 mg total) by mouth every 4 (four) hours as needed for nausea or vomiting. Patient taking differently: Take 12.5 mg by mouth every 8 (eight) hours as needed for nausea or vomiting. 09/09/20   Marylynn Verneita CROME, MD    Allergies as of 04/30/2023 - Review Complete 04/11/2023  Allergen Reaction Noted   Ambien  [zolpidem ] Swelling and Other (See Comments) 11/24/2016    Family History  Problem Relation Age of Onset   Heart disease Father    Colon  cancer Neg Hx    Colon polyps Neg Hx    Esophageal cancer Neg Hx    Rectal cancer Neg Hx    Stomach cancer Neg Hx     Social History   Socioeconomic History   Marital status: Widowed    Spouse name: Not on file   Number of children: 2   Years of education: Not on file   Highest education level: Associate degree: occupational, scientist, product/process development, or vocational program  Occupational History   Occupation: Acupuncturist: [employer]   Occupation: SERVICE DIRECTOR    Employer: [employer]  Tobacco Use   Smoking status: Former    Current packs/day: 0.00    Average packs/day: 2.0 packs/day for 18.0 years (36.0 ttl pk-yrs)    Types: Cigarettes    Start date: 04/25/1987    Quit date: 04/24/2005    Years since quitting: 18.1   Smokeless tobacco: Never   Tobacco comments:    quit 20 years ago   Vaping Use   Vaping status: Never Used  Substance and Sexual Activity   Alcohol use: Yes    Alcohol/week: 4.0 standard drinks of alcohol    Types: 4 Cans of beer per week    Comment: Occasionally. none last 24 hrs   Drug use: No   Sexual activity: Yes  Other Topics Concern   Not on file  Social History Narrative   Married, gets regular exercise.    Wife deceased   Social Drivers of Corporate Investment Banker Strain: Low Risk  (04/10/2023)   Overall Financial Resource Strain (CARDIA)    Difficulty of Paying Living Expenses: Not very hard  Recent Concern: Financial Resource Strain - Medium Risk (02/26/2023)   Overall Financial Resource Strain (CARDIA)    Difficulty of Paying Living Expenses: Somewhat hard  Food Insecurity: No Food Insecurity (04/10/2023)   Hunger Vital Sign    Worried About Running Out of Food in the Last  Year: Never true    Ran Out of Food in the Last Year: Never true  Recent Concern: Food Insecurity - Food Insecurity Present (02/26/2023)   Hunger Vital Sign    Worried About Running Out of Food in the Last Year: Sometimes true     Ran Out of Food in the Last Year: Sometimes true  Transportation Needs: No Transportation Needs (04/10/2023)   PRAPARE - Administrator, Civil Service (Medical): No    Lack of Transportation (Non-Medical): No  Physical Activity: Inactive (04/10/2023)   Exercise Vital Sign    Days of Exercise per Week: 0 days    Minutes of Exercise per Session: 10 min  Stress: No Stress Concern Present (04/10/2023)   [employer] of Occupational Health - Occupational Stress Questionnaire    Feeling of Stress : Only a little  Recent Concern: Stress - Stress Concern Present (02/26/2023)   [employer] of Occupational Health - Occupational Stress Questionnaire    Feeling of Stress : To some extent  Social Connections: Moderately Integrated (04/10/2023)   Social Connection and Isolation Panel [NHANES]    Frequency of Communication with Friends and Family: Three times a week    Frequency of Social Gatherings with Friends and Family: Once a week    Attends Religious Services: More than 4 times per year    Active Member of [employer] or Organizations: Yes    Attends Banker Meetings: More than 4 times per year    Marital Status: Widowed  Intimate Partner Violence: Not At Risk (02/27/2023)   Humiliation, Afraid, Rape, and Kick questionnaire    Fear of Current or Ex-Partner: No    Emotionally Abused: No    Physically Abused: No    Sexually Abused: No    Review of Systems: See HPI, otherwise negative ROS  Physical Exam: BP (!) 147/89   Pulse 74   Temp (!) 97.2 F (36.2 C) (Temporal)   Resp 16   Ht 6' (1.829 m)   Wt 106.2 kg   SpO2 97%   BMI 31.76 kg/m  General:   Alert,  pleasant and cooperative in NAD Head:  Normocephalic and atraumatic. Neck:  Supple; no masses or thyromegaly. Lungs:  Clear throughout to auscultation.    Heart:  Regular rate and rhythm. Abdomen:  Soft, nontender and nondistended. Normal bowel sounds, without guarding, and without rebound.    Neurologic:  Alert and  oriented x4;  grossly normal neurologically.  Impression/Plan: EITAN DOUBLEDAY is here for an colonoscopy to be performed for a history of adenomatous polyps on 2022   Risks, benefits, limitations, and alternatives regarding  colonoscopy have been reviewed with the patient.  Questions have been answered.  All parties agreeable.   Joshua Copping, MD  05/31/2023, 10:14 AM

## 2023-06-01 ENCOUNTER — Encounter: Payer: Self-pay | Admitting: Gastroenterology

## 2023-06-01 LAB — SURGICAL PATHOLOGY

## 2023-06-02 ENCOUNTER — Other Ambulatory Visit: Payer: Self-pay | Admitting: Surgical

## 2023-06-04 ENCOUNTER — Encounter: Payer: Self-pay | Admitting: Gastroenterology

## 2023-06-21 ENCOUNTER — Other Ambulatory Visit: Payer: Self-pay | Admitting: Family Medicine

## 2023-06-24 ENCOUNTER — Encounter: Payer: Self-pay | Admitting: Internal Medicine

## 2023-06-24 ENCOUNTER — Other Ambulatory Visit: Payer: Self-pay | Admitting: Internal Medicine

## 2023-06-25 NOTE — Telephone Encounter (Signed)
 Refilled: 05/08/2023 Last OV: 04/11/2023 Next OV: 07/11/2023

## 2023-06-26 MED ORDER — HYDROCODONE-ACETAMINOPHEN 10-325 MG PO TABS
1.0000 | ORAL_TABLET | Freq: Two times a day (BID) | ORAL | 0 refills | Status: DC | PRN
Start: 2023-06-26 — End: 2023-08-13

## 2023-06-28 ENCOUNTER — Other Ambulatory Visit: Payer: Self-pay | Admitting: Cardiovascular Disease

## 2023-06-28 NOTE — Telephone Encounter (Signed)
 Last filled by PCP pt should have refills until 09/2023   Disp Refills Start End   carvedilol (COREG) 25 MG tablet 180 tablet 2 04/11/2023 --   Sig - Route: Take 1 tablet (25 mg total) by mouth 2 (two) times daily. - Oral   Sent to pharmacy as: carvedilol (COREG) 25 MG tablet   E-Prescribing Status: Receipt confirmed by pharmacy (04/11/2023  4:21 PM EST)

## 2023-07-03 ENCOUNTER — Other Ambulatory Visit: Payer: Self-pay | Admitting: Surgical

## 2023-07-03 ENCOUNTER — Other Ambulatory Visit (HOSPITAL_COMMUNITY): Payer: Self-pay | Admitting: Psychiatry

## 2023-07-03 DIAGNOSIS — F331 Major depressive disorder, recurrent, moderate: Secondary | ICD-10-CM

## 2023-07-09 ENCOUNTER — Encounter (HOSPITAL_COMMUNITY): Payer: Self-pay | Admitting: Psychiatry

## 2023-07-09 ENCOUNTER — Telehealth (HOSPITAL_BASED_OUTPATIENT_CLINIC_OR_DEPARTMENT_OTHER): Payer: Medicare HMO | Admitting: Psychiatry

## 2023-07-09 VITALS — Wt 232.0 lb

## 2023-07-09 DIAGNOSIS — F419 Anxiety disorder, unspecified: Secondary | ICD-10-CM

## 2023-07-09 DIAGNOSIS — F331 Major depressive disorder, recurrent, moderate: Secondary | ICD-10-CM | POA: Diagnosis not present

## 2023-07-09 MED ORDER — ARIPIPRAZOLE 5 MG PO TABS: ORAL_TABLET | ORAL | 0 refills | Status: DC

## 2023-07-09 MED ORDER — BUPROPION HCL ER (XL) 300 MG PO TB24: 300.0000 mg | ORAL_TABLET | Freq: Every day | ORAL | 0 refills | Status: DC

## 2023-07-09 NOTE — Progress Notes (Signed)
 [facility] MD Virtual Progress Note   Patient Location: Home Provider Location: Home Office  I connect with patient by video and verified that I am speaking with correct person by using two identifiers. I discussed the limitations of evaluation and management by telemedicine and the availability of in person appointments. I also discussed with the patient that there may be a patient responsible charge related to this service. The patient expressed understanding and agreed to proceed.  [name] [id number] 58 y.o.  07/09/2023 1:44 PM  History of Present Illness:  Patient is evaluated by video session.  He is taking Abilify and Wellbutrin which is helping his depression and anxiety.  He also on Klonopin, BuSpar, gabapentin.  He had a quite Christmas but he had a good social network.  This is the first time he had Christmas since his mother died in 2022/11/21.  Patient is still feels sad when he think about his loved 1 who had lost in recent time.  He is very attached to church activities.  He tried to keep himself busy.  He sleeps good.  Recently had a colonoscopy and found to have precancerous polyps which were removed.  He also given the diagnosis of diverticulitis.  He is taking care of his health and started walking and watching his calorie intake.  Denies any hallucination, paranoia, suicidal thoughts.  He reported may have few pounds weight loss but not sure about it.  He has appointment coming up to see his primary care next week.  Patient lives by himself but he has a lot of family around him.  He denies drinking or using any illegal substances.  Past Psychiatric History: H/O depression and anxiety.  No h/o suicidal attempt or inpatient treatment.  Seen Dr. Edwin Dada and given Zoloft, Klonopin and Cymbalta.  Later PCP added BuSpar and trazodone.  We tried Lamictal but did not help.     Outpatient Encounter Medications as of 07/09/2023  Medication Sig   albuterol (VENTOLIN  HFA) 108 (90 Base) MCG/ACT inhaler INHALE 2 PUFFS BY MOUTH EVERY 6 HOURS AS NEEDED FOR WHEEZING OR SHORTNESS OF BREATH   amLODipine (NORVASC) 5 MG tablet Take 1 tablet (5 mg total) by mouth daily.   ARIPiprazole (ABILIFY) 5 MG tablet Take one tab at bed time   aspirin EC 81 MG tablet Take 1 tablet (81 mg total) by mouth daily at 6 PM. Swallow whole.   Blood Glucose Monitoring Suppl (ACCU-CHEK AVIVA PLUS) w/Device KIT Use to check blood sugars up to 4 times daily. ICD-10 E11.40   buPROPion (WELLBUTRIN XL) 300 MG 24 hr tablet Take 1 tablet (300 mg total) by mouth daily.   busPIRone (BUSPAR) 15 MG tablet Take 1 tablet (15 mg total) by mouth 3 (three) times daily.   carvedilol (COREG) 25 MG tablet Take 1 tablet (25 mg total) by mouth 2 (two) times daily.   celecoxib (CELEBREX) 100 MG capsule Take 1 capsule by mouth twice daily   clonazePAM (KLONOPIN) 1 MG tablet Take 1 tablet (1 mg total) by mouth 2 (two) times daily as needed. for anxiety   dicyclomine (BENTYL) 20 MG tablet Take 1 tablet (20 mg total) by mouth every 6 (six) hours.   empagliflozin (JARDIANCE) 10 MG TABS tablet Take 10 mg by mouth daily.   ezetimibe (ZETIA) 10 MG tablet Take 1 tablet (10 mg total) by mouth daily.   folic acid (FOLVITE) 1 MG tablet Take 1 tablet (1 mg total) by mouth daily.  furosemide (LASIX) 20 MG tablet Take 1 tablet (20 mg total) by mouth daily.   gabapentin (NEURONTIN) 300 MG capsule Take 3 capsules (900 mg total) by mouth 3 (three) times daily.   glipiZIDE 2.5 MG TABS Take 1 tablet by mouth 2 (two) times daily before a meal.   glucose blood (ACCU-CHEK AVIVA PLUS) test strip Use to check blood sugars up to 4 times daily. ICD-10 E11.40   hydrALAZINE (APRESOLINE) 25 MG tablet Take 1 tablet (25 mg total) by mouth 3 (three) times daily.   HYDROcodone-acetaminophen (NORCO) 10-325 MG tablet Take 1 tablet by mouth 2 (two) times daily as needed.   isosorbide mononitrate (IMDUR) 30 MG 24 hr tablet Take 1 tablet by mouth  once daily   Lancets (ACCU-CHEK MULTICLIX) lancets Use to to check blood sugars up to 4 times daily. ICD-10: E11.40   levothyroxine (SYNTHROID) 88 MCG tablet Take 1 tablet (88 mcg total) by mouth daily before breakfast.   losartan (COZAAR) 100 MG tablet Take 1 tablet (100 mg total) by mouth daily.   metFORMIN (GLUCOPHAGE) 500 MG tablet Take 1 tablet (500 mg total) by mouth 2 (two) times daily with a meal.   methocarbamol (ROBAXIN) 500 MG tablet TAKE 1 TABLET BY MOUTH EVERY 8 HOURS AS NEEDED   Multiple Vitamin (MULTIVITAMIN WITH MINERALS) TABS tablet Take 1 tablet by mouth daily.   naloxone (NARCAN) nasal spray 4 mg/0.1 mL Use as needed for excessive sedation /opioid overdose   omeprazole (PRILOSEC) 40 MG capsule TAKE 1 CAPSULE BY MOUTH IN THE MORNING AND 1 CAPSULE IN THE EVENING   promethazine (PHENERGAN) 12.5 MG tablet Take 1 tablet (12.5 mg total) by mouth every 4 (four) hours as needed for nausea or vomiting. (Patient taking differently: Take 12.5 mg by mouth every 8 (eight) hours as needed for nausea or vomiting.)   rosuvastatin (CRESTOR) 40 MG tablet Take 1 tablet (40 mg total) by mouth daily.   traZODone (DESYREL) 100 MG tablet TAKE 1 & 1/2 (ONE & ONE-HALF) TABLETS BY MOUTH AT BEDTIME   No facility-administered encounter medications on file as of 07/09/2023.    Recent Results (from the past 2160 hours)  Urine Microalbumin w/creat. ratio     Status: None   Collection Time: 04/11/23  4:29 PM  Result Value Ref Range   Microalb, Ur <0.7 0.0 - 1.9 mg/dL   Creatinine,U 16.1 mg/dL   Microalb Creat Ratio 1.0 0.0 - 30.0 mg/g  HgB A1c     Status: Abnormal   Collection Time: 04/11/23  4:29 PM  Result Value Ref Range   Hgb A1c MFr Bld 6.9 (H) 4.6 - 6.5 %    Comment: Glycemic Control Guidelines for People with Diabetes:Non Diabetic:  <6%Goal of Therapy: <7%Additional Action Suggested:  >8%   Comp Met (CMET)     Status: Abnormal   Collection Time: 04/11/23  4:29 PM  Result Value Ref Range    Sodium 138 135 - 145 mEq/L   Potassium 4.5 3.5 - 5.1 mEq/L   Chloride 101 96 - 112 mEq/L   CO2 31 19 - 32 mEq/L   Glucose, Bld 114 (H) 70 - 99 mg/dL   BUN 14 6 - 23 mg/dL   Creatinine, Ser 0.96 0.40 - 1.50 mg/dL   Total Bilirubin 0.4 0.2 - 1.2 mg/dL   Alkaline Phosphatase 101 39 - 117 U/L   AST 21 0 - 37 U/L   ALT 25 0 - 53 U/L   Total Protein 6.5 6.0 - 8.3  g/dL   Albumin 4.3 3.5 - 5.2 g/dL   GFR 16.10 >96.04 mL/min    Comment: Calculated using the CKD-EPI Creatinine Equation (2021)   Calcium 8.7 8.4 - 10.5 mg/dL  Lipid Profile     Status: Abnormal   Collection Time: 04/11/23  4:29 PM  Result Value Ref Range   Cholesterol 91 0 - 200 mg/dL    Comment: ATP III Classification       Desirable:  < 200 mg/dL               Borderline High:  200 - 239 mg/dL          High:  > = 540 mg/dL   Triglycerides 981.1 (H) 0.0 - 149.0 mg/dL    Comment: Normal:  <914 mg/dLBorderline High:  150 - 199 mg/dL   HDL 78.29 (L) >56.21 mg/dL   VLDL 30.8 0.0 - 65.7 mg/dL   LDL Cholesterol 26 0 - 99 mg/dL   Total CHOL/HDL Ratio 3     Comment:                Men          Women1/2 Average Risk     3.4          3.3Average Risk          5.0          4.42X Average Risk          9.6          7.13X Average Risk          15.0          11.0                       NonHDL 56.41     Comment: NOTE:  Non-HDL goal should be 30 mg/dL higher than patient's LDL goal (i.e. LDL goal of < 70 mg/dL, would have non-HDL goal of < 100 mg/dL)  Direct LDL     Status: None   Collection Time: 04/11/23  4:29 PM  Result Value Ref Range   Direct LDL 49.0 mg/dL    Comment: Optimal:  <846 mg/dLNear or Above Optimal:  100-129 mg/dLBorderline High:  130-159 mg/dLHigh:  160-189 mg/dLVery High:  >190 mg/dL  TSH     Status: None   Collection Time: 04/11/23  4:29 PM  Result Value Ref Range   TSH 1.81 0.35 - 5.50 uIU/mL  CBC with Differential/Platelet     Status: Abnormal   Collection Time: 04/11/23  4:29 PM  Result Value Ref Range   WBC 5.1 4.0  - 10.5 K/uL   RBC 4.58 4.22 - 5.81 Mil/uL   Hemoglobin 13.7 13.0 - 17.0 g/dL   HCT 96.2 95.2 - 84.1 %   MCV 90.3 78.0 - 100.0 fl   MCHC 33.1 30.0 - 36.0 g/dL   RDW 32.4 40.1 - 02.7 %   Platelets 208.0 150.0 - 400.0 K/uL   Neutrophils Relative % 66.4 43.0 - 77.0 %   Lymphocytes Relative 19.2 12.0 - 46.0 %   Monocytes Relative 8.5 3.0 - 12.0 %   Eosinophils Relative 5.1 (H) 0.0 - 5.0 %   Basophils Relative 0.8 0.0 - 3.0 %   Neutro Abs 3.4 1.4 - 7.7 K/uL   Lymphs Abs 1.0 0.7 - 4.0 K/uL   Monocytes Absolute 0.4 0.1 - 1.0 K/uL   Eosinophils Absolute 0.3 0.0 - 0.7 K/uL   Basophils Absolute 0.0 0.0 -  0.1 K/uL  Basic metabolic panel     Status: Abnormal   Collection Time: 05/08/23  3:15 PM  Result Value Ref Range   Glucose 132 (H) 70 - 99 mg/dL   BUN 18 6 - 24 mg/dL   Creatinine, Ser 5.36 0.76 - 1.27 mg/dL   eGFR 82 >64 QI/HKV/4.25   BUN/Creatinine Ratio 17 9 - 20   Sodium 141 134 - 144 mmol/L   Potassium 4.1 3.5 - 5.2 mmol/L   Chloride 103 96 - 106 mmol/L   CO2 20 20 - 29 mmol/L   Calcium 9.0 8.7 - 10.2 mg/dL  ECHOCARDIOGRAM COMPLETE     Status: None   Collection Time: 05/21/23  1:20 PM  Result Value Ref Range   AV Peak grad 14.6 mmHg   Ao pk vel 1.91 m/s   S' Lateral 4.70 cm   Area-P 1/2 4.39 cm2   AV Mean grad 8.0 mmHg   Est EF 55 - 60%   Surgical pathology     Status: None   Collection Time: 05/31/23 12:00 AM  Result Value Ref Range   SURGICAL PATHOLOGY      SURGICAL PATHOLOGY [facility] [address], [address] [place], [place] [address] Telephone [phone] or [phone] Fax [phone]  REPORT OF SURGICAL PATHOLOGY   Accession #: [id number] Patient Name: OLLIE, ESTY Visit # : [id number]  MRN: [id number] Physician: Midge Minium DOB/Age May 19, 1965 (Age: 53) Gender: M Collected Date: 05/31/2023 Received Date: 05/31/2023  FINAL DIAGNOSIS       1. Sigmoid  Colon Polyp, Cold snare x2 :       FRAGMENTS OF SESSILE SERRATED POLYP  WITHOUT CYTOLOGIC DYSPLASIA.       DATE SIGNED OUT: 06/01/2023 ELECTRONIC SIGNATURE : Lance Coon Md, Pathologist, Electronic Signature  MICROSCOPIC DESCRIPTION  CASE COMMENTS STAINS USED IN DIAGNOSIS: H&E    CLINICAL HISTORY  SPECIMEN(S) OBTAINED 1. Sigmoid  Colon Polyp, Cold Snare X2  SPECIMEN COMMENTS: SPECIMEN CLINICAL INFORMATION: 1. History of colon polyps.  Colon polyps    Gross Description 1. "Sigmoid colon polyp cold snare x 2".  Receive d in formalin is 1.1 x 0.5 x 0.2 cm aggregate of two tan polypoid tissue fragments submitted entirely in block 1A..  (SB:kh 05/31/23)        Report signed out from the following location(s) [place] [address], [place] [address] CLIA #: [id number]  [facility] [address] [place], [place] [address] CLIA #: [id number]   Glucose, capillary     Status: Abnormal   Collection Time: 05/31/23 10:08 AM  Result Value Ref Range   Glucose-Capillary 126 (H) 70 - 99 mg/dL    Comment: Glucose reference range applies only to samples taken after fasting for at least 8 hours.     Psychiatric Specialty Exam: Physical Exam  Review of Systems  Weight 232 lb (105.2 kg).There is no height or weight on file to calculate BMI.  General Appearance: Casual  Eye Contact:  Good  Speech:  Clear and Coherent and Normal Rate  Volume:  Normal  Mood:  Euthymic  Affect:  Appropriate  Thought Process:  Goal Directed  Orientation:  Full (Time, Place, and Person)  Thought Content:  Logical  Suicidal Thoughts:  No  Homicidal Thoughts:  No  Memory:  Immediate;   Good Recent;   Good Remote;   Good  Judgement:  Intact  Insight:  Present  Psychomotor Activity:  Normal  Concentration:  Concentration: Good and Attention Span: Good  Recall:  Good  Fund of Knowledge:  Good  Language:  Good  Akathisia:  No  Handed:  Right  AIMS (if indicated):     Assets:  Communication Skills Desire for  Improvement Housing Social Support Transportation  ADL's:  Intact  Cognition:  WNL  Sleep:  ok     Assessment/Plan: MDD (major depressive disorder), recurrent episode, moderate (HCC) - Plan: ARIPiprazole (ABILIFY) 5 MG tablet, buPROPion (WELLBUTRIN XL) 300 MG 24 hr tablet  Anxiety - Plan: buPROPion (WELLBUTRIN XL) 300 MG 24 hr tablet  Patient stable on current medication.  Reviewed notes from other provider.  He feels the combination of current medicine is working.  Continue Abilify 5 mg daily and Wellbutrin XL 300 mg daily.  He is also on trazodone, Klonopin, BuSpar and gabapentin from his primary care.  Encouraged to keep appointment with PCP.  Recommend to call us back with any question or any concern.  Follow-up in 3 months.  Patient is not interested in therapy at this time.   Follow Up Instructions:     I discussed the assessment and treatment plan with the patient. The patient was provided an opportunity to ask questions and all were answered. The patient agreed with the plan and demonstrated an understanding of the instructions.   The patient was advised to call back or seek an in-person evaluation if the symptoms worsen or if the condition fails to improve as anticipated.    Collaboration of Care: Other provider involved in patient's care AEB notes are available in epic to review  Patient/Guardian was advised Release of Information must be obtained prior to any record release in order to collaborate their care with an outside provider. Patient/Guardian was advised if they have not already done so to contact the registration department to sign all necessary forms in order for [place] to release information regarding their care.   Consent: Patient/Guardian gives verbal consent for treatment and assignment of benefits for services provided during this visit. Patient/Guardian expressed understanding and agreed to proceed.     I provided 18 minutes of non face to face time during this  encounter.  Note: This document was prepared by [employer] voice dictation technology and any errors that results from this process are unintentional.    Cleotis Nipper, MD 07/09/2023

## 2023-07-10 ENCOUNTER — Other Ambulatory Visit: Payer: Self-pay | Admitting: Cardiovascular Disease

## 2023-07-11 ENCOUNTER — Ambulatory Visit (INDEPENDENT_AMBULATORY_CARE_PROVIDER_SITE_OTHER): Payer: Medicare HMO | Admitting: Internal Medicine

## 2023-07-11 ENCOUNTER — Encounter: Payer: Self-pay | Admitting: Internal Medicine

## 2023-07-11 VITALS — BP 136/70 | HR 76 | Ht 72.0 in | Wt 248.8 lb

## 2023-07-11 DIAGNOSIS — E1169 Type 2 diabetes mellitus with other specified complication: Secondary | ICD-10-CM

## 2023-07-11 DIAGNOSIS — F411 Generalized anxiety disorder: Secondary | ICD-10-CM

## 2023-07-11 DIAGNOSIS — Z7984 Long term (current) use of oral hypoglycemic drugs: Secondary | ICD-10-CM

## 2023-07-11 DIAGNOSIS — E663 Overweight: Secondary | ICD-10-CM | POA: Diagnosis not present

## 2023-07-11 DIAGNOSIS — I10 Essential (primary) hypertension: Secondary | ICD-10-CM | POA: Diagnosis not present

## 2023-07-11 DIAGNOSIS — R609 Edema, unspecified: Secondary | ICD-10-CM | POA: Diagnosis not present

## 2023-07-11 DIAGNOSIS — E785 Hyperlipidemia, unspecified: Secondary | ICD-10-CM

## 2023-07-11 DIAGNOSIS — E119 Type 2 diabetes mellitus without complications: Secondary | ICD-10-CM | POA: Diagnosis not present

## 2023-07-11 DIAGNOSIS — G894 Chronic pain syndrome: Secondary | ICD-10-CM | POA: Diagnosis not present

## 2023-07-11 MED ORDER — DICYCLOMINE HCL 20 MG PO TABS: 20.0000 mg | ORAL_TABLET | Freq: Four times a day (QID) | ORAL | 0 refills | Status: DC

## 2023-07-11 MED ORDER — PREGABALIN 25 MG PO CAPS: 25.0000 mg | ORAL_CAPSULE | Freq: Two times a day (BID) | ORAL | 0 refills | Status: DC

## 2023-07-11 MED ORDER — CLONAZEPAM 0.5 MG PO TABS: ORAL_TABLET | ORAL | 2 refills | Status: DC

## 2023-07-11 NOTE — Progress Notes (Unsigned)
 Subjective:  Patient ID: [name], male    DOB: 1966-02-06  Age: 58 y.o. MRN: [id number]  CC: The primary encounter diagnosis was Essential hypertension. Diagnoses of Hyperlipidemia associated with type 2 diabetes mellitus (HCC), Diabetes mellitus treated with oral medication (HCC), 1+ pitting edema, Chronic pain associated with significant psychosocial dysfunction, Overweight, Morbid obesity (HCC), and Anxiety state were also pertinent to this visit.   HPI NIKKO GOLDWIRE presents for  Chief Complaint  Patient presents with   Medical Management of Chronic Issues    3 month follow up on pain management and diabetes   1) memory issues:  during a visit from son he had difficulty remembering an upcoming event . He reports that he s not  forgetting to pay bills ,  turning lights or  stove off,  land not losing his car or getting lost driving   2) chronic pain :  neuropathy bothering him the most. Pain with standing  limits his activities.  On max dose gabapentin 900 mg tid.  Hydrocodone  10/325 twice daily   3) GAD:  clonazepam dependent   has been taking 1 mg bid  for years.  , buspar . Discussed reducing the morning dose to 00.75 given memory concerns   4) MDD :  seeing Arfeen ,  wellbutrin and abilify prescribed . Christmas was emotionally dificult but he stays in contact with his children/  5) T2DM/ Hypertension, hyperlipidemia and obesity.  he has had an intentional weight loss of 10 lbs since last visit by reducing the sugar in his  diet.  Taking his meds as directed,  not checking sugars unless he feels hypoglycemic,  no recent events.    Outpatient Medications Prior to Visit  Medication Sig Dispense Refill   albuterol (VENTOLIN HFA) 108 (90 Base) MCG/ACT inhaler INHALE 2 PUFFS BY MOUTH EVERY 6 HOURS AS NEEDED FOR WHEEZING OR SHORTNESS OF BREATH 9 g 0   amLODipine (NORVASC) 5 MG tablet Take 1 tablet (5 mg total) by mouth daily. 90 tablet 3   ARIPiprazole (ABILIFY) 5 MG tablet  Take one tab at bed time 90 tablet 0   aspirin EC 81 MG tablet Take 1 tablet (81 mg total) by mouth daily at 6 PM. Swallow whole. 30 tablet 12   atorvastatin (LIPITOR) 80 MG tablet Take 80 mg by mouth daily.     Blood Glucose Monitoring Suppl (ACCU-CHEK AVIVA PLUS) w/Device KIT Use to check blood sugars up to 4 times daily. ICD-10 E11.40 1 kit 0   buPROPion (WELLBUTRIN XL) 300 MG 24 hr tablet Take 1 tablet (300 mg total) by mouth daily. 90 tablet 0   busPIRone (BUSPAR) 15 MG tablet Take 1 tablet (15 mg total) by mouth 3 (three) times daily. 270 tablet 3   carvedilol (COREG) 25 MG tablet Take 1 tablet (25 mg total) by mouth 2 (two) times daily. 180 tablet 2   celecoxib (CELEBREX) 100 MG capsule Take 1 capsule by mouth twice daily 60 capsule 0   empagliflozin (JARDIANCE) 10 MG TABS tablet Take 10 mg by mouth daily.     ezetimibe (ZETIA) 10 MG tablet Take 1 tablet (10 mg total) by mouth daily. 90 tablet 3   folic acid (FOLVITE) 1 MG tablet Take 1 tablet (1 mg total) by mouth daily. 90 tablet 0   furosemide (LASIX) 20 MG tablet Take 1 tablet (20 mg total) by mouth daily. 90 tablet 1   gabapentin (NEURONTIN) 300 MG capsule Take 3 capsules (  900 mg total) by mouth 3 (three) times daily. 810 capsule 0   glipiZIDE 2.5 MG TABS Take 1 tablet by mouth 2 (two) times daily before a meal. 180 tablet 1   glucose blood (ACCU-CHEK AVIVA PLUS) test strip Use to check blood sugars up to 4 times daily. ICD-10 E11.40 400 each 0   hydrALAZINE (APRESOLINE) 25 MG tablet Take 1 tablet (25 mg total) by mouth 3 (three) times daily. 270 tablet 1   HYDROcodone-acetaminophen (NORCO) 10-325 MG tablet Take 1 tablet by mouth 2 (two) times daily as needed. 60 tablet 0   isosorbide mononitrate (IMDUR) 30 MG 24 hr tablet Take 1 tablet by mouth once daily 90 tablet 0   Lancets (ACCU-CHEK MULTICLIX) lancets Use to to check blood sugars up to 4 times daily. ICD-10: E11.40 400 each 0   levothyroxine (SYNTHROID) 88 MCG tablet Take 1  tablet (88 mcg total) by mouth daily before breakfast. 90 tablet 1   losartan (COZAAR) 100 MG tablet Take 1 tablet (100 mg total) by mouth daily. 90 tablet 1   metFORMIN (GLUCOPHAGE) 500 MG tablet Take 1 tablet (500 mg total) by mouth 2 (two) times daily with a meal. 180 tablet 1   methocarbamol (ROBAXIN) 500 MG tablet TAKE 1 TABLET BY MOUTH EVERY 8 HOURS AS NEEDED 90 tablet 0   Multiple Vitamin (MULTIVITAMIN WITH MINERALS) TABS tablet Take 1 tablet by mouth daily.     naloxone (NARCAN) nasal spray 4 mg/0.1 mL Use as needed for excessive sedation /opioid overdose 1 each 1   omeprazole (PRILOSEC) 40 MG capsule TAKE 1 CAPSULE BY MOUTH IN THE MORNING AND 1 CAPSULE IN THE EVENING 180 capsule 3   promethazine (PHENERGAN) 12.5 MG tablet Take 1 tablet (12.5 mg total) by mouth every 4 (four) hours as needed for nausea or vomiting. (Patient taking differently: Take 12.5 mg by mouth every 8 (eight) hours as needed for nausea or vomiting.) 20 tablet 0   rosuvastatin (CRESTOR) 40 MG tablet Take 1 tablet (40 mg total) by mouth daily. 90 tablet 1   traZODone (DESYREL) 100 MG tablet TAKE 1 & 1/2 (ONE & ONE-HALF) TABLETS BY MOUTH AT BEDTIME 135 tablet 1   clonazePAM (KLONOPIN) 1 MG tablet Take 1 tablet (1 mg total) by mouth 2 (two) times daily as needed. for anxiety 60 tablet 2   dicyclomine (BENTYL) 20 MG tablet Take 1 tablet (20 mg total) by mouth every 6 (six) hours. 360 tablet 0   No facility-administered medications prior to visit.    Review of Systems;  Patient denies headache, fevers, malaise, unintentional weight loss, skin rash, eye pain, sinus congestion and sinus pain, sore throat, dysphagia,  hemoptysis , cough, dyspnea, wheezing, chest pain, palpitations, orthopnea, edema, abdominal pain, nausea, melena, diarrhea, constipation, flank pain, dysuria, hematuria, urinary  Frequency, nocturia, numbness, tingling, seizures,  Focal weakness, Loss of consciousness,  Tremor,  and suicidal ideation.       Objective:  BP 136/70   Pulse 76   Ht 6' (1.829 m)   Wt 248 lb 12.8 oz (112.9 kg)   SpO2 96%   BMI 33.74 kg/m   BP Readings from Last 3 Encounters:  07/11/23 136/70  05/31/23 (!) 151/91  05/08/23 128/62    Wt Readings from Last 3 Encounters:  07/11/23 248 lb 12.8 oz (112.9 kg)  07/09/23 232 lb (105.2 kg)  05/31/23 234 lb 3.2 oz (106.2 kg)    Physical Exam  Lab Results  Component Value Date  HGBA1C 6.9 (H) 04/11/2023   HGBA1C 7.6 (H) 11/30/2022   HGBA1C 7.1 (H) 07/26/2022    Lab Results  Component Value Date   CREATININE 1.05 05/08/2023   CREATININE 1.05 04/11/2023   CREATININE 1.12 11/30/2022    Lab Results  Component Value Date   WBC 5.1 04/11/2023   HGB 13.7 04/11/2023   HCT 41.4 04/11/2023   PLT 208.0 04/11/2023   GLUCOSE 132 (H) 05/08/2023   CHOL 91 04/11/2023   TRIG 151.0 (H) 04/11/2023   HDL 34.70 (L) 04/11/2023   LDLDIRECT 49.0 04/11/2023   LDLCALC 26 04/11/2023   ALT 25 04/11/2023   AST 21 04/11/2023   NA 141 05/08/2023   K 4.1 05/08/2023   CL 103 05/08/2023   CREATININE 1.05 05/08/2023   BUN 18 05/08/2023   CO2 20 05/08/2023   TSH 1.81 04/11/2023   PSA 0.70 10/16/2017   INR 0.98 08/27/2015   HGBA1C 6.9 (H) 04/11/2023   MICROALBUR <0.7 04/11/2023    No results found.  Assessment & Plan:  .Essential hypertension -     Comprehensive metabolic panel; Future -     Microalbumin / creatinine urine ratio; Future  Hyperlipidemia associated with type 2 diabetes mellitus (HCC) Assessment & Plan: He appears to be taking atorvastatin ,  Zetia AND rosuvastatin (prescribed by multiple providers).  He is at goal. Will confirm with patient and dc atorvastatin  Lab Results  Component Value Date   CHOL 91 04/11/2023   HDL 34.70 (L) 04/11/2023   LDLCALC 26 04/11/2023   LDLDIRECT 49.0 04/11/2023   TRIG 151.0 (H) 04/11/2023   CHOLHDL 3 04/11/2023   Lab Results  Component Value Date   HGBA1C 6.9 (H) 04/11/2023     Orders: -     LDL  cholesterol, direct; Future -     Lipid panel; Future  Diabetes mellitus treated with oral medication (HCC) -     Comprehensive metabolic panel; Future -     Hemoglobin A1c; Future -     Microalbumin / creatinine urine ratio; Future  1+ pitting edema Assessment & Plan: managed with  furosemide to 20 mg daily and Jardiance . Last ECHO noted normal LVEF    Chronic pain associated with significant psychosocial dysfunction Assessment & Plan: He requires management of pain involving both knees,   Cervical spine and shoulders with hydrocodone,  Refill history confirmed via [place] Controlled Substance database, accessed by me today..  refilling for 3 months    Overweight  Morbid obesity (HCC) Assessment & Plan: Complicated by diabetes , fatty liver, hypertension and CAD.  He is losing weight through careful diet    Anxiety state Assessment & Plan: We have begun a slow wean from clonazepam .  Total daily dose is 2 mg .    reducing his morning dose to 0.75 mg .  We will reduce his total daily dose by 0.25 mg every 3 months    Other orders -     Dicyclomine HCl; Take 1 tablet (20 mg total) by mouth every 6 (six) hours.  Dispense: 360 tablet; Refill: 0 -     Pregabalin; Take 1 capsule (25 mg total) by mouth 2 (two) times daily.  Dispense: 60 capsule; Refill: 0 -     clonazePAM; 1.5 tablets in the morning,  2 tablets in the evening  Dispense: 105 tablet; Refill: 2     I spent 34 minutes on the day of this face to face encounter reviewing patient's  most recent visit  with cardiology, psychiatry, prior relevant surgical and non surgical procedures, recent  labs and imaging studies, counseling on weight management,  reviewing the assessment and plan with patient, and post visit ordering and reviewing of  diagnostics and therapeutics with patient  .   Follow-up: No follow-ups on file.   [name], MD

## 2023-07-11 NOTE — Patient Instructions (Addendum)
 Clonazepam has been considered to be harmful to memory with long term use, so   I recommend that we GRADUALLY,  SLOWLY reduce your clonazepam dose .  I will replace the 1 mg tablets with 0.5 mg tablets so we can reduce the 1 mg morning dose to 0.75 mg (1.5 tablets of the 0.5 mg dose)  So your clonazepam regimen for the next 3 months will be:  1.5 tablets (0.75 mg ) in the morning    2 tablets (1.0 mg ) in the evening    I am adding Lyrica 25 mg in the morning and afternoon  for your neuropathy

## 2023-07-12 ENCOUNTER — Encounter: Payer: Self-pay | Admitting: Internal Medicine

## 2023-07-12 NOTE — Assessment & Plan Note (Signed)
 He appears to be taking atorvastatin ,  Zetia AND rosuvastatin (prescribed by multiple providers).  He is at goal. Will confirm with patient and dc atorvastatin  Lab Results  Component Value Date   CHOL 91 04/11/2023   HDL 34.70 (L) 04/11/2023   LDLCALC 26 04/11/2023   LDLDIRECT 49.0 04/11/2023   TRIG 151.0 (H) 04/11/2023   CHOLHDL 3 04/11/2023   Lab Results  Component Value Date   HGBA1C 6.9 (H) 04/11/2023

## 2023-07-12 NOTE — Assessment & Plan Note (Signed)
 Complicated by diabetes , fatty liver, hypertension and CAD.  He is losing weight through careful diet

## 2023-07-12 NOTE — Assessment & Plan Note (Signed)
 We have begun a slow wean from clonazepam .  Total daily dose is 2 mg .    reducing his morning dose to 0.75 mg .  We will reduce his total daily dose by 0.25 mg every 3 months

## 2023-07-12 NOTE — Assessment & Plan Note (Addendum)
 managed with  furosemide to 20 mg daily and Jardiance . Last ECHO noted normal LVEF

## 2023-07-12 NOTE — Assessment & Plan Note (Signed)
 He requires management of pain involving both knees,   Cervical spine and shoulders with hydrocodone,  Refill history confirmed via [place] Controlled Substance database, accessed by me today..  refilling for 3 months

## 2023-07-13 MED ORDER — ROSUVASTATIN CALCIUM 40 MG PO TABS: 40.0000 mg | ORAL_TABLET | Freq: Every day | ORAL | 1 refills | Status: DC

## 2023-07-13 NOTE — Telephone Encounter (Signed)
 Pt notified. Refill sent for Crestor.

## 2023-07-13 NOTE — Addendum Note (Signed)
 Addended by: Kristie Cowman on: 07/13/2023 03:43 PM   Modules accepted: Orders

## 2023-07-14 NOTE — Progress Notes (Unsigned)
 Cardiology Clinic Note   Date: 07/18/2023 ID: Vlad, Mayberry 1966/02/19, MRN [id number]  Primary Cardiologist:  Julien Nordmann, MD  Chief Complaint   [name] is a 58 y.o. male who presents to the clinic today for follow up after echo.   Patient Profile   [name] is followed by Dr. Mariah Milling for the history outlined below.      Past medical history significant for: Nonobstructive CAD. LHC 08/27/2015: Mid LCx 30%.  Coronary CTA 02/17/2019: Coronary calcium score 50 (82nd percentile). Nonobstructive disease LAD and RCA. Suspected mild stenosis mid LCx.  Echo 05/21/2023: EF 55 to 60%.  No RWMA.  Normal diastolic parameters.  Normal RV size/function.  No significant valvular abnormalities. Hypertension.  Hyperlipidemia.  Lipid panel 04/11/2023: LDL 49, HDL 35, TG 151, total 91. OSA.  Adherent to CPAP.  GERD.  T2DM. Hypothyroidism.  DVT.   In summary, patient was initially followed by Dr. Gala Romney for CAD. He had a heart catheterization for persistent chest pain that demonstrated normal coronary arteries. In May 2017 he had several episodes of exertional chest pain with diaphoresis for which he was evaluated at [facility] ED. He underwent LHC with Dr. Gala Romney which showed minimal CAD as detailed above. Coronary CTA in October 2020 showed nonobstructive disease as detailed above. He was seen by Dr. Jens Som in May 2022 for preoperative evaluation prior to knee replacement. At that time he was doing well with complaints. He was seen in follow up in August 2023 with complaints of lower extremity edema that began after starting amlodipine. Metoprolol and amlodipine were stopped and patient was placed on carvedilol.    Patient was seen in January 2024 for routine follow-up.  He reported continued chest pain was started on isosorbide for possible coronary spasm.  Patient was last seen in the office by me on 05/08/2023 for routine follow-up and preoperative evaluation.  He reported  improved chest pain since starting isosorbide.  He reported a 2 to 75-month history of awakening in the middle of the night with shortness of breath causing him to sit up to catch his breath and needing to prop up on extra pillows to go back to sleep.  This was occurring 3 or more times in a week.  He reported chronic lower extremity edema unchanged from previous and typically best in the morning progressing throughout the day.  He had been evaluated by PCP the month prior and Lasix was increased from 3 times a week to daily dosing.  He was pending colonoscopy.  He underwent echo which showed normal LV/RV function with normal diastolic parameters and no RWMA.  He was deemed an acceptable risk to proceed with colonoscopy.     History of Present Illness    Today, patient  reports continued PND unchanged from previous. This occurs most days of the week and can happen multiple times throughout the night. He adheres to CPAP use. He will occasionally have chest pain with episodes. He will also have some dyspnea during waking hours with exertional activities. He will have mild mid-sternal chest pain with these episodes as well.  He reports lower extremity edema that progresses throughout the day but is not resolved in the morning. He reports the tightness in bilateral legs can become fairly uncomfortable. Discussed results of echo in detail. All questions answered.     ROS: All other systems reviewed and are otherwise negative except as noted in History of Present Illness.  EKGs/Labs Reviewed  04/11/2023: ALT 25; AST 21 05/08/2023: BUN 18; Creatinine, Ser 1.05; Potassium 4.1; Sodium 141   04/11/2023: Hemoglobin 13.7; WBC 5.1   04/11/2023: TSH 1.81   08/16/2022: B Natriuretic Peptide 187.7   Physical Exam    VS:  BP (!) 140/64 (BP Location: Left Arm, Patient Position: Sitting, Cuff Size: Normal)   Pulse 76   Ht 6' (1.829 m)   Wt 247 lb (112 kg)   SpO2 93%   BMI 33.50 kg/m  , BMI Body mass  index is 33.5 kg/m.  GEN: Well nourished, well developed, in no acute distress. Neck: No JVD or carotid bruits. Cardiac:  RRR. No murmurs. No rubs or gallops.   Respiratory:  Respirations regular and unlabored. Clear to auscultation without rales, wheezing or rhonchi. GI: Soft, nontender, nondistended. Extremities: Radials/DP/PT 2+ and equal bilaterally. No clubbing or cyanosis. 1+ pitting edema bilateral lower extremities.   Skin: Warm and dry, no rash. Neuro: Strength intact.  Assessment & Plan   Nonobstructive CAD/Chest pain S/p LHC May 2017 demonstrating minimal CAD. Coronary CTA October 2020 showed calcium score of 50 and nonobstructive CAD. Patient reports more frequent episodes of chest pain typically occurring with daytime exertional dyspnea and PND episodes. Pain is mid-sternal and brief usually resolving when dyspnea resolves.  -Continue amlodipine, carvedilol, isosorbide, rosuvastatin, Zetia.  -Schedule coronary CTA.  -BMP today.    Paroxysmal nocturnal dyspnea/lower extremity edema/OSA Echo January 2025 showed normal LV/RV function with normal diastolic parameters, no RWMA, no significant valvular abnormalities.  Patient reports a 4-5 month history of being awakened in the middle of the night with shortness of breath causing him to sit up to catch his breath then prop up with pillows to return to sleep. He normally starts off sleeping on just 1 pillow. This is happening most days of the week and multiple times throughout the night. He is adherent to CPAP.  Lasix doing was changed from 3 times a week dosing to daily dosing on 12/18 per PCP. He feels this has helped his symptoms initially but now lower extremity edema is more consistent and not resolving in the mornings. The tightness in bilateral legs causes him discomfort. 1+ pitting edema bilateral lower extremities. Lungs clear to auscultation. -Increase Lasix to 40 mg daily x 4 days then 20 mg thereafter.  -Repeat BMP in 2  weeks.  -Refer to pulmonology.    Hypertension BP today 140/64. No report of headaches or dizziness. -Continue losartan, isosorbide, hydralazine, carvedilol, amlodipine.    Hyperlipidemia LDL December 2024 49, at goal.  -Continue atorvastatin and Zetia.   Disposition: BMP in 2 weeks. Increase Lasix to 40 mg x 4 days then 20 mg thereafter. BMP in 2 weeks. Coronary CTA. Refer to pulmonology.  Return in 6-8 weeks or sooner as needed.          Signed, Etta Grandchild. Haroldine Redler, DNP, NP-C

## 2023-07-16 ENCOUNTER — Encounter: Payer: Self-pay | Admitting: Internal Medicine

## 2023-07-16 MED ORDER — GABAPENTIN 300 MG PO CAPS: 900.0000 mg | ORAL_CAPSULE | Freq: Three times a day (TID) | ORAL | 0 refills | Status: DC

## 2023-07-18 ENCOUNTER — Ambulatory Visit: Payer: Medicare HMO | Attending: Student | Admitting: Student

## 2023-07-18 ENCOUNTER — Encounter: Payer: Self-pay | Admitting: Student

## 2023-07-18 VITALS — BP 140/64 | HR 76 | Ht 72.0 in | Wt 247.0 lb

## 2023-07-18 DIAGNOSIS — R0600 Dyspnea, unspecified: Secondary | ICD-10-CM

## 2023-07-18 DIAGNOSIS — R079 Chest pain, unspecified: Secondary | ICD-10-CM | POA: Diagnosis not present

## 2023-07-18 DIAGNOSIS — G4733 Obstructive sleep apnea (adult) (pediatric): Secondary | ICD-10-CM

## 2023-07-18 DIAGNOSIS — R600 Localized edema: Secondary | ICD-10-CM | POA: Diagnosis not present

## 2023-07-18 DIAGNOSIS — Z79899 Other long term (current) drug therapy: Secondary | ICD-10-CM

## 2023-07-18 DIAGNOSIS — E785 Hyperlipidemia, unspecified: Secondary | ICD-10-CM

## 2023-07-18 DIAGNOSIS — R0609 Other forms of dyspnea: Secondary | ICD-10-CM

## 2023-07-18 DIAGNOSIS — I2510 Atherosclerotic heart disease of native coronary artery without angina pectoris: Secondary | ICD-10-CM

## 2023-07-18 MED ORDER — METOPROLOL TARTRATE 50 MG PO TABS: ORAL_TABLET | ORAL | 0 refills | Status: DC

## 2023-07-18 MED ORDER — FUROSEMIDE 20 MG PO TABS: ORAL_TABLET | ORAL | 1 refills | Status: DC

## 2023-07-18 NOTE — Patient Instructions (Addendum)
 Medication Instructions:  Your physician recommends the following medication changes.  TAKE: Metoprolol 100 mg once about 2 hours before your Cardiac CTA  START TAKING: Lasix 40 mg for 4 days, THEN 20 mg daily    *If you need a refill on your cardiac medications before your next appointment, please call your pharmacy*   Lab Work: Your provider would like for you to have following labs drawn today BMeT.    Your provider would like for you to return in 1.5 weeks to have the following labs drawn: BMeT.   Please go to [facility] [address] ([facility] [address], [place] [address] You do not need an appointment.  They are open from 8 am- 4:30 pm.  Lunch from 1:00 pm- 2:00 pm You DO NOT need to be fasting.  If you have labs (blood work) drawn today and your tests are completely normal, you will receive your results only by: MyChart Message (if you have MyChart) OR A paper copy in the mail If you have any lab test that is abnormal or we need to change your treatment, we will call you to review the results.   Testing/Procedures:  Your cardiac CT will be scheduled at the below location:   [facility] [address] [place], [place] [address] [phone]  If scheduled at [facility] or [facility], please arrive 15 mins early for check-in and test prep.  There is spacious parking and easy access to the radiology department from the [facility] entrance. Please enter here and check-in with the desk attendant.   If scheduled at [facility], please arrive 30 minutes early for check-in and test prep.  Please follow these instructions carefully (unless otherwise directed):  An IV will be required for this test and Nitroglycerin will be given.  Hold all erectile dysfunction medications at least 3 days (72 hrs) prior to test. (Ie viagra, cialis,  sildenafil, tadalafil, etc)   On the Night Before the Test: Be sure to Drink plenty of water. Do not consume any caffeinated/decaffeinated beverages or chocolate 12 hours prior to your test. Do not take any antihistamines 12 hours prior to your test.  On the Day of the Test: Drink plenty of water until 1 hour prior to the test. Do not eat any food 1 hour prior to test. You may take your regular medications prior to the test.  Take metoprolol (Lopressor) two hours prior to test. If you take Furosemide/Hydrochlorothiazide/Spironolactone/Chlorthalidone, please HOLD on the morning of the test. Patients who wear a continuous glucose monitor MUST remove the device prior to scanning.      After the Test: Drink plenty of water. After receiving IV contrast, you may experience a mild flushed feeling. This is normal. On occasion, you may experience a mild rash up to 24 hours after the test. This is not dangerous. If this occurs, you can take Benadryl 25 mg, Zyrtec, Claritin, or Allegra and increase your fluid intake. (Patients taking Tikosyn should avoid Benadryl, and may take Zyrtec, Claritin, or Allegra) If you experience trouble breathing, this can be serious. If it is severe call [phone] IMMEDIATELY. If it is mild, please call our office.  We will call to schedule your test 2-4 weeks out understanding that some insurance companies will need an authorization prior to the service being performed.   For more information and frequently asked questions, please visit our website : [email or web address]  For  non-scheduling related questions, please contact the cardiac imaging nurse navigator should you have any questions/concerns: Cardiac Imaging Nurse Navigators Direct Office Dial: [phone]   For scheduling needs, including cancellations and rescheduling, please call [place], [phone].   Follow-Up: At [facility], you and your health needs are our priority.  As part of  our continuing mission to provide you with exceptional heart care, we have created designated Provider Care Teams.  These Care Teams include your primary Cardiologist (physician) and Advanced Practice Providers (APPs -  Physician Assistants and Nurse Practitioners) who all work together to provide you with the care you need, when you need it.   Your next appointment:   6-8 week(s) - after testing complete  Provider:   You may see Julien Nordmann, MD or Carlos Levering, NP   Other Instructions Referral to Pulmonology

## 2023-07-19 LAB — BASIC METABOLIC PANEL WITH GFR
BUN/Creatinine Ratio: 14 (ref 9–20)
BUN: 14 mg/dL (ref 6–24)
CO2: 26 mmol/L (ref 20–29)
Calcium: 9 mg/dL (ref 8.7–10.2)
Chloride: 99 mmol/L (ref 96–106)
Creatinine, Ser: 1.03 mg/dL (ref 0.76–1.27)
Glucose: 101 mg/dL — ABNORMAL HIGH (ref 70–99)
Potassium: 4.5 mmol/L (ref 3.5–5.2)
Sodium: 140 mmol/L (ref 134–144)
eGFR: 84 mL/min/{1.73_m2} (ref >59)

## 2023-07-25 ENCOUNTER — Telehealth (HOSPITAL_COMMUNITY): Payer: Self-pay | Admitting: *Deleted

## 2023-07-25 NOTE — Telephone Encounter (Signed)
 Reaching out to patient to offer assistance regarding upcoming cardiac imaging study; pt verbalizes understanding of appt date/time, parking situation and where to check in, pre-test NPO status and medications ordered, and verified current allergies; name and call back number provided for further questions should they arise Johney Frame RN Navigator Cardiac Imaging [facility] [phone] office [phone] cell

## 2023-07-26 ENCOUNTER — Ambulatory Visit
Admission: RE | Admit: 2023-07-26 | Discharge: 2023-07-26 | Disposition: A | Discharge status: Home / Self Care | Source: Ambulatory Visit | Attending: Student | Admitting: Student

## 2023-07-26 DIAGNOSIS — I2510 Atherosclerotic heart disease of native coronary artery without angina pectoris: Secondary | ICD-10-CM | POA: Diagnosis not present

## 2023-07-26 DIAGNOSIS — R079 Chest pain, unspecified: Secondary | ICD-10-CM | POA: Diagnosis not present

## 2023-07-26 MED ORDER — SODIUM CHLORIDE 0.9 % IV SOLN: INTRAVENOUS | Status: DC

## 2023-07-26 MED ORDER — NITROGLYCERIN 0.4 MG SL SUBL
0.4000 mg | SUBLINGUAL_TABLET | Freq: Once | SUBLINGUAL | Status: AC
  Administered 2023-07-26: 0.4 mg via SUBLINGUAL

## 2023-07-26 MED ORDER — METOPROLOL TARTRATE 5 MG/5ML IV SOLN: 10.0000 mg | Freq: Once | INTRAVENOUS | Status: DC | PRN

## 2023-07-26 MED ORDER — NITROGLYCERIN 0.4 MG SL SUBL: 0.8000 mg | SUBLINGUAL_TABLET | Freq: Once | SUBLINGUAL | Status: DC

## 2023-07-26 MED ORDER — IOHEXOL 350 MG/ML SOLN
100.0000 mL | Freq: Once | INTRAVENOUS | Status: AC | PRN
  Administered 2023-07-26: 100 mL via INTRAVENOUS

## 2023-07-26 MED ORDER — DILTIAZEM HCL 25 MG/5ML IV SOLN: 10.0000 mg | INTRAVENOUS | Status: DC | PRN

## 2023-07-26 NOTE — Progress Notes (Signed)
 Patient tolerated CT well. Drank water after. Vital signs stable encourage to drink water throughout day.Reasons explained and verbalized understanding. Ambulated steady gait.

## 2023-07-27 ENCOUNTER — Other Ambulatory Visit
Admission: RE | Admit: 2023-07-27 | Discharge: 2023-07-27 | Disposition: A | Discharge status: Home / Self Care | Source: Ambulatory Visit | Attending: Student | Admitting: Student

## 2023-07-27 DIAGNOSIS — Z79899 Other long term (current) drug therapy: Secondary | ICD-10-CM | POA: Diagnosis not present

## 2023-07-27 DIAGNOSIS — R079 Chest pain, unspecified: Secondary | ICD-10-CM | POA: Diagnosis not present

## 2023-07-27 LAB — BASIC METABOLIC PANEL WITH GFR
Anion gap: 8 (ref 5–15)
BUN: 18 mg/dL (ref 6–20)
CO2: 27 mmol/L (ref 22–32)
Calcium: 8.4 mg/dL — ABNORMAL LOW (ref 8.9–10.3)
Chloride: 101 mmol/L (ref 98–111)
Creatinine, Ser: 0.97 mg/dL (ref 0.61–1.24)
GFR, Estimated: >60 mL/min (ref >60)
Glucose, Bld: 94 mg/dL (ref 70–99)
Potassium: 4.3 mmol/L (ref 3.5–5.1)
Sodium: 136 mmol/L (ref 135–145)

## 2023-07-30 ENCOUNTER — Encounter: Payer: Self-pay | Admitting: Internal Medicine

## 2023-08-02 ENCOUNTER — Encounter: Payer: Self-pay | Admitting: Internal Medicine

## 2023-08-02 MED ORDER — DEXCOM G7 SENSOR MISC: 6 refills | Status: DC

## 2023-08-02 NOTE — Telephone Encounter (Signed)
 Pt requesting. Sensor pended for review

## 2023-08-05 ENCOUNTER — Other Ambulatory Visit: Payer: Self-pay | Admitting: Surgical

## 2023-08-11 ENCOUNTER — Encounter: Payer: Self-pay | Admitting: Internal Medicine

## 2023-08-11 ENCOUNTER — Other Ambulatory Visit: Payer: Self-pay | Admitting: Internal Medicine

## 2023-08-13 MED ORDER — HYDROCODONE-ACETAMINOPHEN 10-325 MG PO TABS
1.0000 | ORAL_TABLET | Freq: Two times a day (BID) | ORAL | 0 refills | Status: DC | PRN
Start: 2023-08-13 — End: 2023-10-12

## 2023-08-13 NOTE — Telephone Encounter (Signed)
 Refilled: 06/26/2023 Last OV: 07/11/2023 Next OV: 10/12/2023

## 2023-08-15 ENCOUNTER — Encounter: Payer: Self-pay | Admitting: Internal Medicine

## 2023-08-15 ENCOUNTER — Ambulatory Visit: Admitting: Internal Medicine

## 2023-08-15 VITALS — BP 100/58 | HR 65 | Temp 97.1°F | Ht 72.0 in | Wt 249.6 lb

## 2023-08-15 DIAGNOSIS — G4733 Obstructive sleep apnea (adult) (pediatric): Secondary | ICD-10-CM

## 2023-08-15 DIAGNOSIS — Z87891 Personal history of nicotine dependence: Secondary | ICD-10-CM

## 2023-08-15 DIAGNOSIS — I10 Essential (primary) hypertension: Secondary | ICD-10-CM

## 2023-08-15 DIAGNOSIS — E119 Type 2 diabetes mellitus without complications: Secondary | ICD-10-CM | POA: Diagnosis not present

## 2023-08-15 NOTE — Patient Instructions (Addendum)
 Referral to DME company [employer] AIR-TOUCH FIT F30i MASK START AUTO CPAP 4-12 cm h20  REFERRAL TO DME FOR NEW CPAP MACHINE  Avoid Allergens and Irritants Avoid secondhand smoke Avoid SICK contacts Recommend  Masking  when appropriate Recommend Keep up-to-date with vaccinations

## 2023-08-15 NOTE — Progress Notes (Signed)
 [facility] Pulmonary Medicine Consultation      Chief Complaint  Patient presents with   Consult    On CPAP for years. Pressure is too much. Wakes up gasping for air. Fatigue.     Constitutional:  BP (!) 100/58 (BP Location: Right Arm, Cuff Size: Large)   Pulse 65   Temp (!) 97.1 F (36.2 C)   Ht 6' (1.829 m)   Wt 249 lb 9.6 oz (113.2 kg)   SpO2 97%   BMI 33.85 kg/m   Past Medical History:  Chronic pancreatitis, Anxiety, Depression, OA, DM, Fatty liver, GERD, Goiter, HLD, HTN, Hypothyroidism  Summary:  [name] is a 58 y.o. male former smoker with obstructive sleep apnea.  Subjective:  Follow-up assessment for sleep apnea Previously seen by Dr. Matilde Son and in Dr. Gloria Lares  Unable to review CPAP download however patient wears his mask every day and wears a CPAP every day  Patient uses and benefits from therapy Using CPAP nightly and with naps Pressure setting is comfortable and is sleeping well.  No exacerbation at this time No evidence of heart failure at this time No evidence or signs of infection at this time No respiratory distress No fevers, chills, nausea, vomiting, diarrhea No evidence of lower extremity edema No evidence hemoptysis  Patient does see cardiology has lower extremity edema Is on Lasix          Physical Exam:   BP (!) 100/58 (BP Location: Right Arm, Cuff Size: Large)   Pulse 65   Temp (!) 97.1 F (36.2 C)   Ht 6' (1.829 m)   Wt 249 lb 9.6 oz (113.2 kg)   SpO2 97%   BMI 33.85 kg/m          Review of Systems: Gen:  Denies  fever, sweats, chills weight loss  HEENT: Denies blurred vision, double vision, ear pain, eye pain, hearing loss, nose bleeds, sore throat Cardiac:  No dizziness, chest pain or heaviness, chest tightness,edema, No JVD Resp:   No cough, -sputum production, -shortness of breath,-wheezing, -hemoptysis,  Other:  All other systems negative   Physical Examination:   General Appearance: No  distress  EYES PERRLA, EOM intact.   NECK Supple, No JVD Pulmonary: normal breath sounds, No wheezing.  CardiovascularNormal S1,S2.  No m/r/g.   Abdomen: Benign, Soft, non-tender. Neurology UE/LE 5/5 strength, no focal deficits Ext pulses intact, cap refill intact +edema ALL OTHER ROS ARE NEGATIVE                               Assessment/Plan:  58 year old pleasant white male with morbid obesity and underlying moderate sleep apnea   Assessment of OSA Previous AHI 18 Patient definitely benefits the use of CPAP therapy as prescribed Unable to see download at this time seems like there is a malfunction in the machine CPAP prescription 14 Patient will need new mask and new machine for proper assessment Referral to DME company [employer] FIT F30i MASK START AUTO CPAP 4-12 cm h20   No evidence of acute heart failure at this time No respiratory distress No fevers, chills, nausea, vomiting, diarrhea No evidence hemoptysis  Patient Instructions Continue to use CPAP every night, minimum of 4-6 hours a night.  Change equipment every 30 days or as directed by DME.  Wash your tubing with warm soap and water  daily, hang to dry. Wash humidifier portion weekly. Use bottled, distilled water  and change  daily   Be aware of reduced alertness and do not drive or operate heavy machinery if experiencing this or drowsiness.  Exercise encouraged, as tolerated. Encouraged proper weight management.  Important to get eight or more hours of sleep  Limiting the use of the computer and television before bedtime.  Decrease naps during the day, so night time sleep will become enhanced.  Limit caffeine, and sleep deprivation.  HTN, stroke, uncontrolled diabetes and heart failure are potential risk factors.  Risk of untreated sleep apnea including cardiac arrhthymias, stroke, DM, pulm HTN.   Hypertension - Sleep apnea can contribute to hypertension, therefore  treatment of sleep apnea is important part of hypertension management.  Diabetes Mellitis - Sleep apnea can contribute to DM, therefore treatment of sleep apnea is important part of DM management.   MEDICATION ADJUSTMENTS/LABS AND TESTS ORDERED: Referral to DME company [employer] FIT F30i MASK START AUTO CPAP 4-12 cm h20 Recommend new CPAP machine Avoid Allergens and Irritants Avoid secondhand smoke Avoid SICK contacts Recommend  Masking  when appropriate Recommend Keep up-to-date with vaccinations      CURRENT MEDICATIONS REVIEWED AT LENGTH WITH PATIENT TODAY   Patient  satisfied with Plan of action and management. All questions answered   Follow up 3 months   I spent a total of 45 minutes reviewing chart data, face-to-face evaluation with the patient, counseling and coordination of care as detailed above.      Lady Pier, M.D.  [facility]  Medical Director [facility] Medical Director [facility] Cardio-Pulmonary Department    Flow Sheet    Sleep tests:  PSG 01/21/07 >> AHI 18, SpO2 low 56%. CPAP 7 cm H2O>>AHI 2.6  Medications:   Allergies as of 08/15/2023       Reactions   Ambien  [zolpidem ] Swelling, Other (See Comments)   Swelling in the throat        Medication List        Accurate as of August 15, 2023 10:10 AM. If you have any questions, ask your nurse or doctor.          Accu-Chek Aviva Plus test strip Generic drug: glucose blood Use to check blood sugars up to 4 times daily. ICD-10 E11.40   Accu-Chek Aviva Plus w/Device Kit Use to check blood sugars up to 4 times daily. ICD-10 E11.40   accu-chek multiclix lancets Use to to check blood sugars up to 4 times daily. ICD-10: E11.40   albuterol  108 (90 Base) MCG/ACT inhaler Commonly known as: VENTOLIN  HFA INHALE 2 PUFFS BY MOUTH EVERY 6 HOURS AS NEEDED FOR WHEEZING OR SHORTNESS OF BREATH   amLODipine  5 MG tablet Commonly known  as: NORVASC  Take 1 tablet (5 mg total) by mouth daily.   ARIPiprazole  5 MG tablet Commonly known as: Abilify  Take one tab at bed time   aspirin  EC 81 MG tablet Take 1 tablet (81 mg total) by mouth daily at 6 PM. Swallow whole.   buPROPion  300 MG 24 hr tablet Commonly known as: Wellbutrin  XL Take 1 tablet (300 mg total) by mouth daily.   busPIRone  15 MG tablet Commonly known as: BUSPAR  Take 1 tablet (15 mg total) by mouth 3 (three) times daily.   carvedilol  25 MG tablet Commonly known as: COREG  Take 1 tablet (25 mg total) by mouth 2 (two) times daily.   celecoxib  100 MG capsule Commonly known as: CELEBREX  Take 1 capsule by mouth twice daily   clonazePAM  0.5 MG tablet  Commonly known as: KLONOPIN  1.5 tablets in the morning,  2 tablets in the evening   Dexcom G7 Sensor Misc Use as directed   dicyclomine  20 MG tablet Commonly known as: BENTYL  Take 1 tablet (20 mg total) by mouth every 6 (six) hours.   ezetimibe  10 MG tablet Commonly known as: ZETIA  Take 1 tablet (10 mg total) by mouth daily.   folic acid  1 MG tablet Commonly known as: FOLVITE  Take 1 tablet (1 mg total) by mouth daily.   furosemide  20 MG tablet Commonly known as: LASIX  Take 2 tablets (40 mg total) by mouth daily for 4 days, THEN 1 tablet (20 mg total) daily. Start taking on: July 18, 2023   gabapentin  300 MG capsule Commonly known as: NEURONTIN  Take 3 capsules (900 mg total) by mouth 3 (three) times daily.   glipiZIDE  2.5 MG Tabs Take 1 tablet by mouth 2 (two) times daily before a meal.   hydrALAZINE  25 MG tablet Commonly known as: APRESOLINE  Take 1 tablet (25 mg total) by mouth 3 (three) times daily.   HYDROcodone -acetaminophen  10-325 MG tablet Commonly known as: NORCO Take 1 tablet by mouth 2 (two) times daily as needed.   isosorbide  mononitrate 30 MG 24 hr tablet Commonly known as: IMDUR  Take 1 tablet by mouth once daily   Jardiance  10 MG Tabs tablet Generic drug:  empagliflozin  Take 10 mg by mouth daily.   levothyroxine  88 MCG tablet Commonly known as: SYNTHROID  Take 1 tablet (88 mcg total) by mouth daily before breakfast.   losartan  100 MG tablet Commonly known as: COZAAR  Take 1 tablet (100 mg total) by mouth daily.   metFORMIN  500 MG tablet Commonly known as: GLUCOPHAGE  Take 1 tablet (500 mg total) by mouth 2 (two) times daily with a meal.   methocarbamol  500 MG tablet Commonly known as: ROBAXIN  TAKE 1 TABLET BY MOUTH EVERY 8 HOURS AS NEEDED   metoprolol  tartrate 50 MG tablet Commonly known as: LOPRESSOR  TAKE BOTH TABLETS 2 HR PRIOR TO CARDIAC PROCEDURE   multivitamin with minerals Tabs tablet Take 1 tablet by mouth daily.   naloxone  4 MG/0.1ML Liqd nasal spray kit Commonly known as: NARCAN  Use as needed for excessive sedation /opioid overdose   omeprazole  40 MG capsule Commonly known as: PRILOSEC TAKE 1 CAPSULE BY MOUTH IN THE MORNING AND 1 CAPSULE IN THE EVENING   pregabalin  25 MG capsule Commonly known as: Lyrica  Take 1 capsule (25 mg total) by mouth 2 (two) times daily.   promethazine  12.5 MG tablet Commonly known as: PHENERGAN  Take 1 tablet (12.5 mg total) by mouth every 4 (four) hours as needed for nausea or vomiting. What changed: when to take this   rosuvastatin  40 MG tablet Commonly known as: CRESTOR  Take 1 tablet (40 mg total) by mouth daily.   traZODone  100 MG tablet Commonly known as: DESYREL  TAKE 1 & 1/2 (ONE & ONE-HALF) TABLETS BY MOUTH AT BEDTIME

## 2023-08-27 NOTE — Progress Notes (Signed)
 Cardiology Clinic Note   Date: 09/07/2023 ID: WILBERT MERRIT, DOB [birth year], MRN [id number]  Primary Cardiologist:  Belva Boyden, MD  Chief Complaint   [name] is a 58 y.o. male who presents to the clinic today for follow up after testing.   Patient Profile   KOFI PRINKEY is followed by Dr. Gollan for the history outlined below.      Past medical history significant for: Nonobstructive CAD. LHC 08/27/2015: Mid LCx 30%.  Echo 05/21/2023: EF 55 to 60%.  No RWMA.  Normal diastolic parameters.  Normal RV size/function.  No significant valvular abnormalities. Coronary CTA 07/26/2023: Coronary calcium  score 138 (77 percentile).  Mild LCx stenosis.  Minimal RCA and LM stenosis. Hypertension.  Hyperlipidemia.  Lipid panel 04/11/2023: LDL 49, HDL 35, TG 151, total 91. OSA.  Adherent to CPAP.  GERD.  T2DM. Hypothyroidism.  DVT.   In summary, patient was initially followed by Dr. Bensimhon for CAD. He had a heart catheterization for persistent chest pain that demonstrated normal coronary arteries. In May 2017 he had several episodes of exertional chest pain with diaphoresis for which he was evaluated at [facility] ED. He underwent LHC with Dr. Julane Ny which showed minimal CAD as detailed above. Coronary CTA in October 2020 showed nonobstructive disease as detailed above. He was seen by Dr. Audery Blazing in May 2022 for preoperative evaluation prior to knee replacement. At that time he was doing well with complaints. He was seen in follow up in August 2023 with complaints of lower extremity edema that began after starting amlodipine . Metoprolol  and amlodipine  were stopped and patient was placed on carvedilol .    Patient was seen in January 2024 for routine follow-up.  He reported continued chest pain was started on isosorbide  for possible coronary spasm.  Patient was last seen in the office by me on 05/08/2023 for routine follow-up and preoperative evaluation.  He reported improved chest pain  since starting isosorbide .  He reported a 2 to 60-month history of awakening in the middle of the night with shortness of breath causing him to sit up to catch his breath and needing to prop up on extra pillows to go back to sleep.  This was occurring 3 or more times in a week.  He reported chronic lower extremity edema unchanged from previous and typically best in the morning progressing throughout the day.  He had been evaluated by PCP the month prior and Lasix  was increased from 3 times a week to daily dosing.  He was pending colonoscopy.  He underwent echo which showed normal LV/RV function with normal diastolic parameters and no RWMA.  He was deemed an acceptable risk to proceed with colonoscopy.   Patient was last seen in the office by me on 07/18/2023 with complaints of continued PND with occasional associated chest pain.  He also experienced dyspnea and mild midsternal chest pain with exertional activities. He was instructed to increase Lasix  to 40 mg x 4 days then return to 20 mg daily. He underwent coronary CTA for further evaluation which showed nonobstructive CAD as detailed above.  He was referred to pulmonology.     History of Present Illness    Today, patient reports lower extremity edema and PND improved while taking the increased dose of Lasix . When he went back to the lower daily dose his edema worsened. He wakes up with edema and it progresses throughout the day. Daily weight fluctuates from 242-246 lb. He does notice more edema when his weight is  up. He sleeps on 2 pillows and uses his CPAP nightly. He does not watch his sodium intake but does not add salt to his food. He denies chest pain, pressure or tightness. His activity is limited by knee pain and bilateral foot pain. He tries to walk as much as tolerated.     ROS: All other systems reviewed and are otherwise negative except as noted in History of Present Illness.  EKGs/Labs Reviewed       EKG is not performed today.    04/11/2023: ALT 25; AST 21 07/27/2023: BUN 18; Creatinine, Ser 0.97; Potassium 4.3; Sodium 136   04/11/2023: Hemoglobin 13.7; WBC 5.1   04/11/2023: TSH 1.81   Physical Exam    VS:  BP 134/66 (BP Location: Left Arm, Patient Position: Sitting, Cuff Size: Large)   Pulse 68   Resp 16   Ht 6' (1.829 m)   Wt 246 lb 6.4 oz (111.8 kg)   SpO2 95%   BMI 33.42 kg/m  , BMI Body mass index is 33.42 kg/m.  GEN: Well nourished, well developed, in no acute distress. Neck: No JVD or carotid bruits. Cardiac:  RRR. No murmurs. No rubs or gallops.   Respiratory:  Respirations regular and unlabored. Clear to auscultation without rales, wheezing or rhonchi. GI: Soft, nontender, nondistended. Extremities: Radials/DP/PT 2+ and equal bilaterally. No clubbing or cyanosis. 2+ pitting edema bilateral lower extremities.   Skin: Warm and dry, no rash. Neuro: Strength intact.  Assessment & Plan   Nonobstructive CAD/Chest pain S/p LHC May 2017 demonstrating minimal CAD. Coronary CTA April 2025 showed coronary calcium  score of 138 with mild LCx stenosis and minimal RCA and LM stenosis.  Patient denies chest pain, pressure or tightness. He tries to walk for exercise but is limited by knee pain and bilateral foot pain. He is going to look into water  aerobics.  -Continue amlodipine , carvedilol , isosorbide , rosuvastatin , Zetia .    Paroxysmal nocturnal dyspnea/lower extremity edema/OSA Echo January 2025 showed normal LV/RV function with normal diastolic parameters, no RWMA, no significant valvular abnormalities.  Patient reports continued lower extremity edema and occasional PND. He reports it was improved on increased dose of Lasix  Daily weight fluctuates between 242-246. He does not watch his sodium but reports he does not add salt to his food. He eats bacon about once a week. 2+ edema bilateral lower extremities.  Lungs clear to auscultation. -Increase Lasix  to 40 mg daily. - BMP today and repeat BMP in 2 weeks.   - Continue daily weights.    Hypertension BP today 134/66. No report of headaches or dizziness. -Continue losartan , isosorbide , hydralazine , carvedilol , amlodipine .    Hyperlipidemia LDL December 2024 49, at goal.  -Continue atorvastatin  and Zetia .   Disposition: Increase Lasix  to 40 mg daily. BMP today. Repeat BMP in 2 weeks.          Signed, Lonell Rives. Torsten Weniger, DNP, NP-C

## 2023-09-07 ENCOUNTER — Ambulatory Visit: Attending: Student | Admitting: Student

## 2023-09-07 ENCOUNTER — Encounter: Payer: Self-pay | Admitting: Student

## 2023-09-07 VITALS — BP 134/66 | HR 68 | Resp 16 | Ht 72.0 in | Wt 246.4 lb

## 2023-09-07 DIAGNOSIS — G4733 Obstructive sleep apnea (adult) (pediatric): Secondary | ICD-10-CM

## 2023-09-07 DIAGNOSIS — I2510 Atherosclerotic heart disease of native coronary artery without angina pectoris: Secondary | ICD-10-CM

## 2023-09-07 DIAGNOSIS — R600 Localized edema: Secondary | ICD-10-CM | POA: Diagnosis not present

## 2023-09-07 DIAGNOSIS — R0609 Other forms of dyspnea: Secondary | ICD-10-CM | POA: Diagnosis not present

## 2023-09-07 DIAGNOSIS — E785 Hyperlipidemia, unspecified: Secondary | ICD-10-CM | POA: Diagnosis not present

## 2023-09-07 DIAGNOSIS — R0600 Dyspnea, unspecified: Secondary | ICD-10-CM

## 2023-09-07 DIAGNOSIS — Z79899 Other long term (current) drug therapy: Secondary | ICD-10-CM | POA: Diagnosis not present

## 2023-09-07 DIAGNOSIS — I10 Essential (primary) hypertension: Secondary | ICD-10-CM

## 2023-09-07 DIAGNOSIS — I25118 Atherosclerotic heart disease of native coronary artery with other forms of angina pectoris: Secondary | ICD-10-CM | POA: Diagnosis not present

## 2023-09-07 MED ORDER — FUROSEMIDE 20 MG PO TABS: 40.0000 mg | ORAL_TABLET | Freq: Every day | ORAL | 3 refills | Status: DC

## 2023-09-07 NOTE — Patient Instructions (Signed)
 Medication Instructions:  Your physician has recommended you make the following change in your medication:   INCREASE Furosemide  (Lasix ) to 40 mg once daily    *If you need a refill on your cardiac medications before your next appointment, please call your pharmacy*  Lab Work: BMET today  BMET in 2 weeks. That can be done here in our office and you do not need appointment. Hours are 8-4 and they are closed between 1-2 for lunch.  If you have labs (blood work) drawn today and your tests are completely normal, you will receive your results only by: MyChart Message (if you have MyChart) OR A paper copy in the mail If you have any lab test that is abnormal or we need to change your treatment, we will call you to review the results.  Testing/Procedures: None  Follow-Up: At [facility], you and your health needs are our priority.  As part of our continuing mission to provide you with exceptional heart care, our providers are all part of one team.  This team includes your primary Cardiologist (physician) and Advanced Practice Providers or APPs (Physician Assistants and Nurse Practitioners) who all work together to provide you with the care you need, when you need it.  Your next appointment:   3 month(s)  Provider:   Timothy Gollan, MD or Morey Ar, NP

## 2023-09-08 ENCOUNTER — Ambulatory Visit: Payer: Self-pay | Admitting: Student

## 2023-09-08 LAB — BASIC METABOLIC PANEL WITH GFR
BUN/Creatinine Ratio: 10 (ref 9–20)
BUN: 10 mg/dL (ref 6–24)
CO2: 25 mmol/L (ref 20–29)
Calcium: 9 mg/dL (ref 8.7–10.2)
Chloride: 99 mmol/L (ref 96–106)
Creatinine, Ser: 0.97 mg/dL (ref 0.76–1.27)
Glucose: 151 mg/dL — ABNORMAL HIGH (ref 70–99)
Potassium: 4.8 mmol/L (ref 3.5–5.2)
Sodium: 138 mmol/L (ref 134–144)
eGFR: 90 mL/min/{1.73_m2} (ref >59)

## 2023-09-10 ENCOUNTER — Other Ambulatory Visit: Payer: Self-pay | Admitting: Surgical

## 2023-10-02 DIAGNOSIS — Z79899 Other long term (current) drug therapy: Secondary | ICD-10-CM | POA: Diagnosis not present

## 2023-10-02 DIAGNOSIS — R0609 Other forms of dyspnea: Secondary | ICD-10-CM | POA: Diagnosis not present

## 2023-10-02 DIAGNOSIS — I2510 Atherosclerotic heart disease of native coronary artery without angina pectoris: Secondary | ICD-10-CM | POA: Diagnosis not present

## 2023-10-02 DIAGNOSIS — I25118 Atherosclerotic heart disease of native coronary artery with other forms of angina pectoris: Secondary | ICD-10-CM | POA: Diagnosis not present

## 2023-10-03 LAB — BASIC METABOLIC PANEL WITH GFR
BUN/Creatinine Ratio: 10 (ref 9–20)
BUN: 11 mg/dL (ref 6–24)
CO2: 21 mmol/L (ref 20–29)
Calcium: 9.1 mg/dL (ref 8.7–10.2)
Chloride: 101 mmol/L (ref 96–106)
Creatinine, Ser: 1.07 mg/dL (ref 0.76–1.27)
Glucose: 103 mg/dL — ABNORMAL HIGH (ref 70–99)
Potassium: 4.3 mmol/L (ref 3.5–5.2)
Sodium: 142 mmol/L (ref 134–144)
eGFR: 80 mL/min/{1.73_m2} (ref >59)

## 2023-10-04 ENCOUNTER — Other Ambulatory Visit: Payer: Self-pay | Admitting: Student

## 2023-10-04 ENCOUNTER — Other Ambulatory Visit: Payer: Self-pay | Admitting: Internal Medicine

## 2023-10-04 ENCOUNTER — Other Ambulatory Visit (HOSPITAL_COMMUNITY): Payer: Self-pay | Admitting: Psychiatry

## 2023-10-04 DIAGNOSIS — F411 Generalized anxiety disorder: Secondary | ICD-10-CM

## 2023-10-04 DIAGNOSIS — F331 Major depressive disorder, recurrent, moderate: Secondary | ICD-10-CM

## 2023-10-04 NOTE — Progress Notes (Signed)
 Last read by Rema Care at 2:16PM on 10/03/2023.

## 2023-10-08 ENCOUNTER — Other Ambulatory Visit: Payer: Self-pay | Admitting: Surgical

## 2023-10-09 ENCOUNTER — Encounter: Payer: Self-pay | Admitting: Internal Medicine

## 2023-10-09 ENCOUNTER — Ambulatory Visit: Admitting: Internal Medicine

## 2023-10-09 VITALS — BP 128/60 | HR 78 | Temp 98.9°F | Ht 72.0 in | Wt 246.4 lb

## 2023-10-09 DIAGNOSIS — G4733 Obstructive sleep apnea (adult) (pediatric): Secondary | ICD-10-CM

## 2023-10-09 NOTE — Progress Notes (Signed)
 [facility] Pulmonary Medicine Consultation      CC Follow-up assessment for OSA  Constitutional:  BP 128/60 (BP Location: Right Arm, Patient Position: Sitting, Cuff Size: Large)   Pulse 78   Temp 98.9 F (37.2 C) (Oral)   Ht 6' (1.829 m)   Wt 246 lb 6.4 oz (111.8 kg)   SpO2 93%   BMI 33.42 kg/m   Past Medical History:  Chronic pancreatitis, Anxiety, Depression, OA, DM, Fatty liver, GERD, Goiter, HLD, HTN, Hypothyroidism  Summary:  [name] is a 58 y.o. male former smoker with obstructive sleep apnea.  Subjective:  Follow-up assessment for sleep apnea Previously seen by Dr. Matilde Son and in Dr. Gloria Lares  Compliance report reviewed in detail  Patient uses and benefits from therapy Using CPAP nightly and with naps Pressure setting is comfortable and is sleeping well. However patient needs to increase his nightly usage Encourage patient to use CPAP during nap time AHI reduced to 1.9 previous AHI 18  No exacerbation at this time No evidence of heart failure at this time No evidence or signs of infection at this time No respiratory distress No fevers, chills, nausea, vomiting, diarrhea No evidence of lower extremity edema No evidence hemoptysis   Patient does see cardiology has lower extremity edema Is on Lasix          Physical Exam:   BP 128/60 (BP Location: Right Arm, Patient Position: Sitting, Cuff Size: Large)   Pulse 78   Temp 98.9 F (37.2 C) (Oral)   Ht 6' (1.829 m)   Wt 246 lb 6.4 oz (111.8 kg)   SpO2 93%   BMI 33.42 kg/m      Review of Systems: Gen:  Denies  fever, sweats, chills weight loss  HEENT: Denies blurred vision, double vision, ear pain, eye pain, hearing loss, nose bleeds, sore throat Cardiac:  No dizziness, chest pain or heaviness, chest tightness,edema, No JVD Resp:   No cough, -sputum production, -shortness of breath,-wheezing, -hemoptysis,  Other:  All other systems negative   Physical Examination:   General  Appearance: No distress  EYES PERRLA, EOM intact.   NECK Supple, No JVD Pulmonary: normal breath sounds, No wheezing.  CardiovascularNormal S1,S2.  No m/r/g.   Abdomen: Benign, Soft, non-tender. Neurology UE/LE 5/5 strength, no focal deficits Ext pulses intact, cap refill intact ALL OTHER ROS ARE NEGATIVE     Assessment/Plan:  58 year old pleasant white male with morbid obesity and underlying moderate sleep apnea   Assessment of OSA Previous AHI 18 Continue CPAP as prescribed  Excellent compliance report Reviewed compliance report in detail with patient Patient definitely benefits the use of CPAP therapy as prescribed Using CPAP nightly and with naps Pressure setting is comfortable and is sleeping well. CPAP prescription 4-12 AHI reduced to 1.9  No evidence of acute heart failure at this time No respiratory distress No fevers, chills, nausea, vomiting, diarrhea No evidence hemoptysis  Patient Instructions Continue to use CPAP every night, minimum of 4-6 hours a night.  Change equipment every 30 days or as directed by DME.  Wash your tubing with warm soap and water  daily, hang to dry. Wash humidifier portion weekly. Use bottled, distilled water  and change daily   Be aware of reduced alertness and do not drive or operate heavy machinery if experiencing this or drowsiness.  Exercise encouraged, as tolerated. Encouraged proper weight management.  Important to get eight or more hours of sleep  Limiting the use of the computer and television before bedtime.  Decrease naps during the day, so night time sleep will become enhanced.  Limit caffeine, and sleep deprivation.  HTN, stroke, uncontrolled diabetes and heart failure are potential risk factors.  Risk of untreated sleep apnea including cardiac arrhthymias, stroke, DM, pulm HTN.   NASAL CRADLE RESMED AIR-TOUCH FIT F30i MASK   Hypertension - Sleep apnea can contribute to hypertension, therefore treatment of sleep apnea  is important part of hypertension management.  Diabetes Mellitis - Sleep apnea can contribute to DM, therefore treatment of sleep apnea is important part of DM management.   MEDICATION ADJUSTMENTS/LABS AND TESTS ORDERED: CPAP machine as prescribed Avoid Allergens and Irritants Avoid secondhand smoke Avoid SICK contacts Recommend  Masking  when appropriate Recommend Keep up-to-date with vaccinations      CURRENT MEDICATIONS REVIEWED AT LENGTH WITH PATIENT TODAY   Patient  satisfied with Plan of action and management. All questions answered   Follow up 3 months   I spent a total of 43 minutes reviewing chart data, face-to-face evaluation with the patient, counseling and coordination of care as detailed above.      Lady Pier, M.D.  [facility]  Medical Director [facility] Medical Director [facility] Cardio-Pulmonary Department    Flow Sheet    Sleep tests:  PSG 01/21/07 >> AHI 18, SpO2 low 56%. CPAP 7 cm H2O>>AHI 2.6  Medications:   Allergies as of 10/09/2023       Reactions   Ambien  [zolpidem ] Swelling, Other (See Comments)   Swelling in the throat        Medication List        Accurate as of October 09, 2023  3:00 PM. If you have any questions, ask your nurse or doctor.          Accu-Chek Aviva Plus test strip Generic drug: glucose blood Use to check blood sugars up to 4 times daily. ICD-10 E11.40   Accu-Chek Aviva Plus w/Device Kit Use to check blood sugars up to 4 times daily. ICD-10 E11.40   accu-chek multiclix lancets Use to to check blood sugars up to 4 times daily. ICD-10: E11.40   albuterol  108 (90 Base) MCG/ACT inhaler Commonly known as: VENTOLIN  HFA INHALE 2 PUFFS BY MOUTH EVERY 6 HOURS AS NEEDED FOR WHEEZING OR SHORTNESS OF BREATH   amLODipine  5 MG tablet Commonly known as: NORVASC  Take 1 tablet (5 mg total) by mouth daily.   ARIPiprazole  5 MG tablet Commonly known as: Abilify  Take one  tab at bed time   aspirin  EC 81 MG tablet Take 1 tablet (81 mg total) by mouth daily at 6 PM. Swallow whole.   buPROPion  300 MG 24 hr tablet Commonly known as: Wellbutrin  XL Take 1 tablet (300 mg total) by mouth daily.   busPIRone  15 MG tablet Commonly known as: BUSPAR  Take 1 tablet (15 mg total) by mouth 3 (three) times daily.   carvedilol  25 MG tablet Commonly known as: COREG  Take 1 tablet (25 mg total) by mouth 2 (two) times daily.   celecoxib  100 MG capsule Commonly known as: CELEBREX  Take 1 capsule by mouth twice daily   clonazePAM  0.5 MG tablet Commonly known as: KLONOPIN  1.5 tablets in the morning,  2 tablets in the evening   Dexcom G7 Sensor Misc Use as directed   dicyclomine  20 MG tablet Commonly known as: BENTYL  Take 1 tablet (20 mg total) by mouth every 6 (six) hours.   ezetimibe  10 MG tablet Commonly known as: ZETIA  Take 1 tablet (  10 mg total) by mouth daily.   folic acid  1 MG tablet Commonly known as: FOLVITE  Take 1 tablet (1 mg total) by mouth daily.   furosemide  20 MG tablet Commonly known as: LASIX  Take 2 tablets (40 mg total) by mouth daily.   gabapentin  300 MG capsule Commonly known as: NEURONTIN  Take 3 capsules (900 mg total) by mouth 3 (three) times daily.   glipiZIDE  2.5 MG Tabs Take 1 tablet by mouth 2 (two) times daily before a meal.   hydrALAZINE  25 MG tablet Commonly known as: APRESOLINE  Take 1 tablet (25 mg total) by mouth 3 (three) times daily.   HYDROcodone -acetaminophen  10-325 MG tablet Commonly known as: NORCO Take 1 tablet by mouth 2 (two) times daily as needed.   isosorbide  mononitrate 30 MG 24 hr tablet Commonly known as: IMDUR  Take 1 tablet by mouth once daily   Jardiance  10 MG Tabs tablet Generic drug: empagliflozin  Take 10 mg by mouth daily.   levothyroxine  88 MCG tablet Commonly known as: SYNTHROID  Take 1 tablet (88 mcg total) by mouth daily before breakfast.   losartan  100 MG tablet Commonly known as:  COZAAR  Take 1 tablet (100 mg total) by mouth daily.   metFORMIN  500 MG tablet Commonly known as: GLUCOPHAGE  Take 1 tablet (500 mg total) by mouth 2 (two) times daily with a meal.   methocarbamol  500 MG tablet Commonly known as: ROBAXIN  TAKE 1 TABLET BY MOUTH EVERY 8 HOURS AS NEEDED   metoprolol  tartrate 50 MG tablet Commonly known as: LOPRESSOR  TAKE BOTH TABLETS 2 HR PRIOR TO CARDIAC PROCEDURE   multivitamin with minerals Tabs tablet Take 1 tablet by mouth daily.   naloxone  4 MG/0.1ML Liqd nasal spray kit Commonly known as: NARCAN  Use as needed for excessive sedation /opioid overdose   omeprazole  40 MG capsule Commonly known as: PRILOSEC TAKE 1 CAPSULE BY MOUTH IN THE MORNING AND 1 CAPSULE IN THE EVENING   pregabalin  25 MG capsule Commonly known as: Lyrica  Take 1 capsule (25 mg total) by mouth 2 (two) times daily.   promethazine  12.5 MG tablet Commonly known as: PHENERGAN  Take 1 tablet (12.5 mg total) by mouth every 4 (four) hours as needed for nausea or vomiting. What changed: when to take this   rosuvastatin  40 MG tablet Commonly known as: CRESTOR  Take 1 tablet (40 mg total) by mouth daily.   traZODone  100 MG tablet Commonly known as: DESYREL  TAKE 1 & 1/2 (ONE & ONE-HALF) TABLETS BY MOUTH AT BEDTIME

## 2023-10-09 NOTE — Patient Instructions (Signed)
 Continue CPAP as prescribed Please use CPAP with nap time

## 2023-10-10 ENCOUNTER — Telehealth (HOSPITAL_COMMUNITY): Admitting: Psychiatry

## 2023-10-11 ENCOUNTER — Telehealth (HOSPITAL_COMMUNITY): Admitting: Psychiatry

## 2023-10-11 ENCOUNTER — Other Ambulatory Visit (HOSPITAL_COMMUNITY): Payer: Self-pay

## 2023-10-11 DIAGNOSIS — F419 Anxiety disorder, unspecified: Secondary | ICD-10-CM

## 2023-10-11 DIAGNOSIS — F331 Major depressive disorder, recurrent, moderate: Secondary | ICD-10-CM

## 2023-10-11 MED ORDER — ARIPIPRAZOLE 5 MG PO TABS: ORAL_TABLET | ORAL | 0 refills | Status: DC

## 2023-10-11 MED ORDER — BUPROPION HCL ER (XL) 300 MG PO TB24
300.0000 mg | ORAL_TABLET | Freq: Every day | ORAL | 0 refills | Status: DC
Start: 2023-10-11 — End: 2023-10-31

## 2023-10-12 ENCOUNTER — Encounter: Payer: Self-pay | Admitting: Internal Medicine

## 2023-10-12 ENCOUNTER — Ambulatory Visit (INDEPENDENT_AMBULATORY_CARE_PROVIDER_SITE_OTHER): Admitting: Internal Medicine

## 2023-10-12 VITALS — BP 140/80 | HR 68 | Ht 72.0 in | Wt 244.8 lb

## 2023-10-12 DIAGNOSIS — F411 Generalized anxiety disorder: Secondary | ICD-10-CM | POA: Diagnosis not present

## 2023-10-12 DIAGNOSIS — E785 Hyperlipidemia, unspecified: Secondary | ICD-10-CM | POA: Diagnosis not present

## 2023-10-12 DIAGNOSIS — F331 Major depressive disorder, recurrent, moderate: Secondary | ICD-10-CM

## 2023-10-12 DIAGNOSIS — E034 Atrophy of thyroid (acquired): Secondary | ICD-10-CM

## 2023-10-12 DIAGNOSIS — Z7984 Long term (current) use of oral hypoglycemic drugs: Secondary | ICD-10-CM | POA: Diagnosis not present

## 2023-10-12 DIAGNOSIS — E1169 Type 2 diabetes mellitus with other specified complication: Secondary | ICD-10-CM | POA: Diagnosis not present

## 2023-10-12 DIAGNOSIS — G894 Chronic pain syndrome: Secondary | ICD-10-CM | POA: Diagnosis not present

## 2023-10-12 DIAGNOSIS — E119 Type 2 diabetes mellitus without complications: Secondary | ICD-10-CM

## 2023-10-12 DIAGNOSIS — D510 Vitamin B12 deficiency anemia due to intrinsic factor deficiency: Secondary | ICD-10-CM | POA: Diagnosis not present

## 2023-10-12 DIAGNOSIS — I10 Essential (primary) hypertension: Secondary | ICD-10-CM | POA: Diagnosis not present

## 2023-10-12 MED ORDER — LOSARTAN POTASSIUM 100 MG PO TABS: 100.0000 mg | ORAL_TABLET | Freq: Every day | ORAL | 1 refills | Status: DC

## 2023-10-12 MED ORDER — HYDRALAZINE HCL 25 MG PO TABS: 25.0000 mg | ORAL_TABLET | Freq: Three times a day (TID) | ORAL | 1 refills | Status: DC

## 2023-10-12 MED ORDER — GABAPENTIN 300 MG PO CAPS: 900.0000 mg | ORAL_CAPSULE | Freq: Three times a day (TID) | ORAL | 0 refills | Status: DC

## 2023-10-12 MED ORDER — CLONAZEPAM 0.5 MG PO TABS: ORAL_TABLET | ORAL | 2 refills | Status: DC

## 2023-10-12 MED ORDER — GLIPIZIDE 2.5 MG PO TABS: 1.0000 | ORAL_TABLET | Freq: Two times a day (BID) | ORAL | 1 refills | Status: DC

## 2023-10-12 MED ORDER — LEVOTHYROXINE SODIUM 88 MCG PO TABS: 88.0000 ug | ORAL_TABLET | Freq: Every day | ORAL | 1 refills | Status: DC

## 2023-10-12 MED ORDER — HYDROCODONE-ACETAMINOPHEN 10-325 MG PO TABS: 1.0000 | ORAL_TABLET | Freq: Every day | ORAL | 0 refills | Status: DC | PRN

## 2023-10-12 MED ORDER — METFORMIN HCL 500 MG PO TABS: 500.0000 mg | ORAL_TABLET | Freq: Two times a day (BID) | ORAL | 1 refills | Status: DC

## 2023-10-12 NOTE — Progress Notes (Unsigned)
 Subjective:  Patient ID: [name], male    DOB: Dec 23, 1965  Age: 58 y.o. MRN: [id number]  CC: There were no encounter diagnoses.   HPI Rodgers LOISE Ada presents for  Chief Complaint  Patient presents with   Medical Management of Chronic Issues    3 month follow up    1) GAD:   Benzodiazepine  dependent :  currently using clonazepam  0.5 mg  0.75 mg  (1.5 tablets)   in the am and   1.0 mg (2 tablets) in the PM   2) chronic pain: using hydrocodone  as needed,  has not used any this week .   3)  anniversary of wife's death 12-13-02)     Outpatient Medications Prior to Visit  Medication Sig Dispense Refill   albuterol  (VENTOLIN  HFA) 108 (90 Base) MCG/ACT inhaler INHALE 2 PUFFS BY MOUTH EVERY 6 HOURS AS NEEDED FOR WHEEZING OR SHORTNESS OF BREATH 9 g 0   amLODipine  (NORVASC ) 5 MG tablet Take 1 tablet (5 mg total) by mouth daily. 90 tablet 3   ARIPiprazole  (ABILIFY ) 5 MG tablet Take one tab at bed time 30 tablet 0   aspirin  EC 81 MG tablet Take 1 tablet (81 mg total) by mouth daily at 6 PM. Swallow whole. 30 tablet 12   Blood Glucose Monitoring Suppl (ACCU-CHEK AVIVA PLUS) w/Device KIT Use to check blood sugars up to 4 times daily. ICD-10 E11.40 1 kit 0   buPROPion  (WELLBUTRIN  XL) 300 MG 24 hr tablet Take 1 tablet (300 mg total) by mouth daily. 30 tablet 0   busPIRone  (BUSPAR ) 15 MG tablet Take 1 tablet (15 mg total) by mouth 3 (three) times daily. 270 tablet 3   carvedilol  (COREG ) 25 MG tablet Take 1 tablet (25 mg total) by mouth 2 (two) times daily. 180 tablet 2   celecoxib  (CELEBREX ) 100 MG capsule Take 1 capsule by mouth twice daily 60 capsule 0   clonazePAM  (KLONOPIN ) 0.5 MG tablet 1.5 tablets in the morning,  2 tablets in the evening 105 tablet 2   Continuous Glucose Sensor (DEXCOM G7 SENSOR) MISC Use as directed (Patient not taking: Reported on 10/12/2023) 3 each 6   dicyclomine  (BENTYL ) 20 MG tablet Take 1 tablet (20 mg total) by mouth every 6 (six) hours. 360 tablet 0    empagliflozin  (JARDIANCE ) 10 MG TABS tablet Take 10 mg by mouth daily.     ezetimibe  (ZETIA ) 10 MG tablet Take 1 tablet (10 mg total) by mouth daily. 90 tablet 3   folic acid  (FOLVITE ) 1 MG tablet Take 1 tablet (1 mg total) by mouth daily. 90 tablet 0   furosemide  (LASIX ) 20 MG tablet Take 2 tablets (40 mg total) by mouth daily. 180 tablet 3   gabapentin  (NEURONTIN ) 300 MG capsule Take 3 capsules (900 mg total) by mouth 3 (three) times daily. 810 capsule 0   glipiZIDE  2.5 MG TABS Take 1 tablet by mouth 2 (two) times daily before a meal. 180 tablet 1   glucose blood (ACCU-CHEK AVIVA PLUS) test strip Use to check blood sugars up to 4 times daily. ICD-10 E11.40 400 each 0   hydrALAZINE  (APRESOLINE ) 25 MG tablet Take 1 tablet (25 mg total) by mouth 3 (three) times daily. 270 tablet 1   HYDROcodone -acetaminophen  (NORCO) 10-325 MG tablet Take 1 tablet by mouth 2 (two) times daily as needed. 60 tablet 0   isosorbide  mononitrate (IMDUR ) 30 MG 24 hr tablet Take 1 tablet by mouth once daily  90 tablet 3   Lancets (ACCU-CHEK MULTICLIX) lancets Use to to check blood sugars up to 4 times daily. ICD-10: E11.40 400 each 0   levothyroxine  (SYNTHROID ) 88 MCG tablet Take 1 tablet (88 mcg total) by mouth daily before breakfast. 90 tablet 1   losartan  (COZAAR ) 100 MG tablet Take 1 tablet (100 mg total) by mouth daily. 90 tablet 1   metFORMIN  (GLUCOPHAGE ) 500 MG tablet Take 1 tablet (500 mg total) by mouth 2 (two) times daily with a meal. 180 tablet 1   methocarbamol  (ROBAXIN ) 500 MG tablet TAKE 1 TABLET BY MOUTH EVERY 8 HOURS AS NEEDED 90 tablet 0   metoprolol  tartrate (LOPRESSOR ) 50 MG tablet TAKE BOTH TABLETS 2 HR PRIOR TO CARDIAC PROCEDURE 2 tablet 0   Multiple Vitamin (MULTIVITAMIN WITH MINERALS) TABS tablet Take 1 tablet by mouth daily.     naloxone  (NARCAN ) nasal spray 4 mg/0.1 mL Use as needed for excessive sedation /opioid overdose 1 each 1   omeprazole  (PRILOSEC) 40 MG capsule TAKE 1 CAPSULE BY MOUTH IN THE  MORNING AND 1 CAPSULE IN THE EVENING 180 capsule 3   pregabalin  (LYRICA ) 25 MG capsule Take 1 capsule (25 mg total) by mouth 2 (two) times daily. 60 capsule 0   promethazine  (PHENERGAN ) 12.5 MG tablet Take 1 tablet (12.5 mg total) by mouth every 4 (four) hours as needed for nausea or vomiting. (Patient taking differently: Take 12.5 mg by mouth every 8 (eight) hours as needed for nausea or vomiting.) 20 tablet 0   rosuvastatin  (CRESTOR ) 40 MG tablet Take 1 tablet (40 mg total) by mouth daily. 90 tablet 1   traZODone  (DESYREL ) 100 MG tablet TAKE 1 & 1/2 (ONE & ONE-HALF) TABLETS BY MOUTH AT BEDTIME 135 tablet 3   No facility-administered medications prior to visit.    Review of Systems;  Patient denies headache, fevers, malaise, unintentional weight loss, skin rash, eye pain, sinus congestion and sinus pain, sore throat, dysphagia,  hemoptysis , cough, dyspnea, wheezing, chest pain, palpitations, orthopnea, edema, abdominal pain, nausea, melena, diarrhea, constipation, flank pain, dysuria, hematuria, urinary  Frequency, nocturia, numbness, tingling, seizures,  Focal weakness, Loss of consciousness,  Tremor, insomnia, depression, anxiety, and suicidal ideation.      Objective:  BP (!) 140/80   Pulse 68   Ht 6' (1.829 m)   Wt 244 lb 12.8 oz (111 kg)   SpO2 94%   BMI 33.20 kg/m   BP Readings from Last 3 Encounters:  10/12/23 (!) 140/80  10/09/23 128/60  09/07/23 134/66    Wt Readings from Last 3 Encounters:  10/12/23 244 lb 12.8 oz (111 kg)  10/09/23 246 lb 6.4 oz (111.8 kg)  09/07/23 246 lb 6.4 oz (111.8 kg)    Physical Exam  Lab Results  Component Value Date   HGBA1C 6.9 (H) 04/11/2023   HGBA1C 7.6 (H) 11/30/2022   HGBA1C 7.1 (H) 07/26/2022    Lab Results  Component Value Date   CREATININE 1.07 10/02/2023   CREATININE 0.97 09/07/2023   CREATININE 0.97 07/27/2023    Lab Results  Component Value Date   WBC 5.1 04/11/2023   HGB 13.7 04/11/2023   HCT 41.4 04/11/2023    PLT 208.0 04/11/2023   GLUCOSE 103 (H) 10/02/2023   CHOL 91 04/11/2023   TRIG 151.0 (H) 04/11/2023   HDL 34.70 (L) 04/11/2023   LDLDIRECT 49.0 04/11/2023   LDLCALC 26 04/11/2023   ALT 25 04/11/2023   AST 21 04/11/2023   NA 142 10/02/2023  K 4.3 10/02/2023   CL 101 10/02/2023   CREATININE 1.07 10/02/2023   BUN 11 10/02/2023   CO2 21 10/02/2023   TSH 1.81 04/11/2023   PSA 0.70 10/16/2017   INR 0.98 08/27/2015   HGBA1C 6.9 (H) 04/11/2023   MICROALBUR <0.7 04/11/2023    No results found.  Assessment & Plan:  .There are no diagnoses linked to this encounter.   I spent 34 minutes on the day of this face to face encounter reviewing patient's  most recent visit with cardiology,  nephrology,  and neurology,  prior relevant surgical and non surgical procedures, recent  labs and imaging studies, counseling on weight management,  reviewing the assessment and plan with patient, and post visit ordering and reviewing of  diagnostics and therapeutics with patient  .   Follow-up: No follow-ups on file.   Verneita LITTIE Kettering, MD

## 2023-10-12 NOTE — Patient Instructions (Addendum)
 We will continue weaning your clonazepam  because your risk of overdose is STILL HIGH   I am reducing your clonazepam  to  3 tablets daily  and your hydrocodone  to 30/month based on your last refill of #60  lasting you  2  months  The Dexcom 7 glucose monitor has been placed on your arm today.  You can use the app to check your blood sugars anytime of the day or night and you can log your meals  in so we correlate your BS with your meals

## 2023-10-13 ENCOUNTER — Encounter: Payer: Self-pay | Admitting: Internal Medicine

## 2023-10-13 LAB — COMPREHENSIVE METABOLIC PANEL WITH GFR
ALT: 24 IU/L (ref 0–44)
AST: 23 IU/L (ref 0–40)
Albumin: 4.4 g/dL (ref 3.8–4.9)
Alkaline Phosphatase: 99 IU/L (ref 44–121)
BUN/Creatinine Ratio: 13 (ref 9–20)
BUN: 14 mg/dL (ref 6–24)
Bilirubin Total: 0.5 mg/dL (ref 0.0–1.2)
CO2: 26 mmol/L (ref 20–29)
Calcium: 9.1 mg/dL (ref 8.7–10.2)
Chloride: 99 mmol/L (ref 96–106)
Creatinine, Ser: 1.07 mg/dL (ref 0.76–1.27)
Globulin, Total: 2.2 g/dL (ref 1.5–4.5)
Glucose: 80 mg/dL (ref 70–99)
Potassium: 4.2 mmol/L (ref 3.5–5.2)
Sodium: 140 mmol/L (ref 134–144)
Total Protein: 6.6 g/dL (ref 6.0–8.5)
eGFR: 80 mL/min/{1.73_m2} (ref >59)

## 2023-10-13 LAB — LIPID PANEL
Chol/HDL Ratio: 2.9 ratio (ref 0.0–5.0)
Cholesterol, Total: 80 mg/dL — ABNORMAL LOW (ref 100–199)
HDL: 28 mg/dL — ABNORMAL LOW (ref >39)
LDL Chol Calc (NIH): 30 mg/dL (ref 0–99)
Triglycerides: 124 mg/dL (ref 0–149)
VLDL Cholesterol Cal: 22 mg/dL (ref 5–40)

## 2023-10-13 LAB — MICROALBUMIN / CREATININE URINE RATIO
Creatinine, Urine: 50.9 mg/dL
Microalb/Creat Ratio: 8 mg/g{creat} (ref 0–29)
Microalbumin, Urine: 4.1 ug/mL

## 2023-10-13 LAB — LDL CHOLESTEROL, DIRECT: LDL Direct: 31 mg/dL (ref 0–99)

## 2023-10-13 LAB — HEMOGLOBIN A1C
Est. average glucose Bld gHb Est-mCnc: 137 mg/dL
Hgb A1c MFr Bld: 6.4 % — ABNORMAL HIGH (ref 4.8–5.6)

## 2023-10-13 LAB — TSH: TSH: 1.96 u[IU]/mL (ref 0.450–4.500)

## 2023-10-15 ENCOUNTER — Ambulatory Visit: Payer: Self-pay | Admitting: Internal Medicine

## 2023-10-15 NOTE — Assessment & Plan Note (Addendum)
 Continue buspirone  .  We are continuing  a slow wean from clonazepam  .  reducing his morning dose to 0 5 mg as part of the plan to  reduce his total daily dose by 0.25 mg every 3 months .

## 2023-10-15 NOTE — Assessment & Plan Note (Signed)
 Continue b12  injections for life   Lab Results  Component Value Date   VITAMINB12 279 09/30/2021   Lab Results  Component Value Date   WBC 5.1 04/11/2023   HGB 13.7 04/11/2023   HCT 41.4 04/11/2023   MCV 90.3 04/11/2023   PLT 208.0 04/11/2023

## 2023-10-15 NOTE — Assessment & Plan Note (Signed)
 He has pain involving cervical spine , lumbar spine,  shoulders and knees. Fortunately,  his use of narcotics has decreased and he is now taking at most one vicodin daily.  Refills quantity has been changed to #30/month

## 2023-10-15 NOTE — Assessment & Plan Note (Signed)
 Complicated by diabetes , fatty liver, hypertension and CAD.  He is losing weight through careful diet ,  4 lbs in the last 3 months.  Given his concurrent diabetes,  will consider GLP 1 agonist at next visit

## 2023-10-15 NOTE — Assessment & Plan Note (Signed)
 Improved control with Jardiance  and  metformin , and glipizide .  . Taking  gabapentin  at the maximal dose (900 qam and q pm  600 mg qhs) A1c and lipids are at goal.    Lab Results  Component Value Date   HGBA1C 6.4 (H) 10/12/2023   Lab Results  Component Value Date   CHOL 80 (L) 10/12/2023   HDL 28 (L) 10/12/2023   LDLCALC 30 10/12/2023   LDLDIRECT 31 10/12/2023   TRIG 124 10/12/2023   CHOLHDL 2.9 10/12/2023

## 2023-10-15 NOTE — Assessment & Plan Note (Signed)
 Mood is stable, managed by  with wellbutrin  , abilify  by psychiatrist Dr Curry

## 2023-10-15 NOTE — Assessment & Plan Note (Signed)
 He is at goal with concurrent use of rosuvastatin  and zetia .  No changes today  Lab Results  Component Value Date   CHOL 80 (L) 10/12/2023   HDL 28 (L) 10/12/2023   LDLCALC 30 10/12/2023   LDLDIRECT 31 10/12/2023   TRIG 124 10/12/2023   CHOLHDL 2.9 10/12/2023   Lab Results  Component Value Date   HGBA1C 6.4 (H) 10/12/2023

## 2023-10-24 ENCOUNTER — Other Ambulatory Visit: Payer: Self-pay | Admitting: Internal Medicine

## 2023-10-28 ENCOUNTER — Emergency Department
Admission: EM | Admit: 2023-10-28 | Discharge: 2023-10-28 | Disposition: A | Discharge status: Home / Self Care | Attending: Emergency Medicine | Admitting: Emergency Medicine

## 2023-10-28 ENCOUNTER — Other Ambulatory Visit: Payer: Self-pay

## 2023-10-28 ENCOUNTER — Emergency Department

## 2023-10-28 DIAGNOSIS — E1140 Type 2 diabetes mellitus with diabetic neuropathy, unspecified: Secondary | ICD-10-CM | POA: Diagnosis not present

## 2023-10-28 DIAGNOSIS — Z96611 Presence of right artificial shoulder joint: Secondary | ICD-10-CM | POA: Diagnosis not present

## 2023-10-28 DIAGNOSIS — S0990XA Unspecified injury of head, initial encounter: Secondary | ICD-10-CM | POA: Diagnosis not present

## 2023-10-28 DIAGNOSIS — W01198A Fall on same level from slipping, tripping and stumbling with subsequent striking against other object, initial encounter: Secondary | ICD-10-CM | POA: Diagnosis not present

## 2023-10-28 DIAGNOSIS — I10 Essential (primary) hypertension: Secondary | ICD-10-CM | POA: Diagnosis not present

## 2023-10-28 DIAGNOSIS — R42 Dizziness and giddiness: Secondary | ICD-10-CM | POA: Diagnosis not present

## 2023-10-28 DIAGNOSIS — R079 Chest pain, unspecified: Secondary | ICD-10-CM | POA: Diagnosis not present

## 2023-10-28 DIAGNOSIS — R9389 Abnormal findings on diagnostic imaging of other specified body structures: Secondary | ICD-10-CM | POA: Diagnosis not present

## 2023-10-28 DIAGNOSIS — R0789 Other chest pain: Secondary | ICD-10-CM | POA: Diagnosis not present

## 2023-10-28 DIAGNOSIS — I6523 Occlusion and stenosis of bilateral carotid arteries: Secondary | ICD-10-CM | POA: Diagnosis not present

## 2023-10-28 DIAGNOSIS — W19XXXA Unspecified fall, initial encounter: Secondary | ICD-10-CM

## 2023-10-28 LAB — BASIC METABOLIC PANEL WITH GFR
Anion gap: 8 (ref 5–15)
BUN: 18 mg/dL (ref 6–20)
CO2: 28 mmol/L (ref 22–32)
Calcium: 8.8 mg/dL — ABNORMAL LOW (ref 8.9–10.3)
Chloride: 101 mmol/L (ref 98–111)
Creatinine, Ser: 1.23 mg/dL (ref 0.61–1.24)
GFR, Estimated: >60 mL/min (ref >60)
Glucose, Bld: 168 mg/dL — ABNORMAL HIGH (ref 70–99)
Potassium: 4.4 mmol/L (ref 3.5–5.1)
Sodium: 137 mmol/L (ref 135–145)

## 2023-10-28 LAB — URINALYSIS, W/ REFLEX TO CULTURE (INFECTION SUSPECTED)
Bacteria, UA: NONE SEEN
Bilirubin Urine: NEGATIVE
Glucose, UA: >=500 mg/dL — AB
Hgb urine dipstick: NEGATIVE
Ketones, ur: NEGATIVE mg/dL
Leukocytes,Ua: NEGATIVE
Nitrite: NEGATIVE
Protein, ur: NEGATIVE mg/dL
Specific Gravity, Urine: 1.011 (ref 1.005–1.030)
Squamous Epithelial / HPF: 0 /HPF (ref 0–5)
pH: 5 (ref 5.0–8.0)

## 2023-10-28 LAB — CBC
HCT: 43.1 % (ref 39.0–52.0)
Hemoglobin: 13.9 g/dL (ref 13.0–17.0)
MCH: 30.2 pg (ref 26.0–34.0)
MCHC: 32.3 g/dL (ref 30.0–36.0)
MCV: 93.7 fL (ref 80.0–100.0)
Platelets: 218 K/uL (ref 150–400)
RBC: 4.6 MIL/uL (ref 4.22–5.81)
RDW: 14.1 % (ref 11.5–15.5)
WBC: 9.9 K/uL (ref 4.0–10.5)
nRBC: 0 % (ref 0.0–0.2)

## 2023-10-28 LAB — TROPONIN I (HIGH SENSITIVITY)
Troponin I (High Sensitivity): 7 ng/L (ref <18)
Troponin I (High Sensitivity): 6 ng/L (ref <18)

## 2023-10-28 MED ORDER — SODIUM CHLORIDE 0.9 % IV BOLUS
1000.0000 mL | Freq: Once | INTRAVENOUS | Status: AC
  Administered 2023-10-28: 1000 mL via INTRAVENOUS

## 2023-10-28 MED ORDER — ACETAMINOPHEN 500 MG PO TABS
1000.0000 mg | ORAL_TABLET | Freq: Once | ORAL | Status: AC
  Administered 2023-10-28: 1000 mg via ORAL
  Filled 2023-10-28: qty 2

## 2023-10-28 NOTE — ED Triage Notes (Addendum)
 Pt comes with c/o cp that started last night. Pt then tripped and fell. Pt states neck pain. Pt denies any loc. Pt no longer having cp. Pt states dizziness also now.   Pt states he went on to bed and woke up this morning still feeling dizzy. Pt  went to church. Pt got worse at [facility]. Pt Bp was checked and it was low. Pt then came here.

## 2023-10-28 NOTE — ED Notes (Signed)
 Pt in CT - CT will take pt to 5h once done

## 2023-10-28 NOTE — Discharge Instructions (Signed)
 You are seen in the emergency department following a fall with head injury.  You are feeling lightheaded.  Your blood pressure was low and concerned that you had dehydration.  You are given IV fluids and her blood pressure improved.  Your heart enzyme was normal and your EKG was normal.  You had a CT scan of your head and neck that did not show any broken bones or bleeding.  It is importantly stay hydrated and drink plenty of fluids.  Concerned that you could have a postconcussive syndrome.  Call your primary care physician tomorrow to schedule a close follow-up appointment this week for reevaluation.  Return to the emergency department if you have any worsening symptoms.  Postconcussive management like we discussed.  Pain control:  Ibuprofen (motrin/aleve karolyn) - You can take 3 tablets (600 mg) every 6 hours as needed for pain/fever.  Acetaminophen  (tylenol ) - You can take 2 extra strength tablets (1000 mg) every 6 hours as needed for pain/fever.  You can alternate these medications or take them together.  Make sure you eat food/drink water  when taking these medications.

## 2023-10-28 NOTE — ED Provider Notes (Signed)
 [facility] Provider Note    Event Date/Time   First MD Initiated Contact with Patient 10/28/23 1050     (approximate)   History   Fall   HPI  [name] is a 58 y.o. male past medical history significant for generalized anxiety disorder, chronic pain on hydrocodone , diabetes, hypertension, who presents to the emergency department following a fall.  Patient states that last night he was getting out of his recliner and slipped on a rug and fell backwards hitting his head and neck.  States that he was having some mild chest discomfort prior to this episode.  States that he went to bed and then this morning he continued to have some dizziness which brought him into the emergency department.  States that he has a history of neuropathy so he does not walk well at baseline.  Denies any room spinning dizziness but does just feel dizzy and lightheaded.  Mild chest pressure at this time.  No change in vision, double vision, slurring of speech or extremity numbness or weakness.  Not on anticoagulation.  No history of DVT or PE.  On chart review patient is on multiple medications at home including Klonopin , hydrocodone , Robaxin .  Also takes multiple antihypertensive medications with hydralazine , Cozaar , metoprolol , Coreg      Physical Exam   Triage Vital Signs: ED Triage Vitals  Encounter Vitals Group     BP 10/28/23 1041 (!) 96/57     Girls Systolic BP Percentile --      Girls Diastolic BP Percentile --      Boys Systolic BP Percentile --      Boys Diastolic BP Percentile --      Pulse Rate 10/28/23 1041 70     Resp 10/28/23 1041 18     Temp 10/28/23 1041 98 F (36.7 C)     Temp src --      SpO2 10/28/23 1126 94 %     Weight 10/28/23 1039 244 lb 12.8 oz (111 kg)     Height 10/28/23 1039 6' (1.829 m)     Head Circumference --      Peak Flow --      Pain Score 10/28/23 1124 8     Pain Loc --      Pain Education --      Exclude from Growth Chart --      Most recent vital signs: Vitals:   10/28/23 1041 10/28/23 1126  BP: (!) 96/57   Pulse: 70   Resp: 18   Temp: 98 F (36.7 C)   SpO2:  94%    Physical Exam Constitutional:      Appearance: He is well-developed.  HENT:     Head: Atraumatic.  Eyes:     Extraocular Movements: Extraocular movements intact.     Conjunctiva/sclera: Conjunctivae normal.     Pupils: Pupils are equal, round, and reactive to light.  Cardiovascular:     Rate and Rhythm: Regular rhythm.     Pulses: Normal pulses.  Pulmonary:     Effort: No respiratory distress.     Breath sounds: No wheezing.  Abdominal:     Tenderness: There is no abdominal tenderness.  Musculoskeletal:     Cervical back: Normal range of motion.     Right lower leg: No edema.     Left lower leg: No edema.     Comments: No unilateral leg swelling  Skin:    General: Skin is warm.  Capillary Refill: Capillary refill takes less than 2 seconds.  Neurological:     Mental Status: He is alert. Mental status is at baseline.     GCS: GCS eye subscore is 4. GCS verbal subscore is 5. GCS motor subscore is 6.     Cranial Nerves: Cranial nerves 2-12 are intact.     Sensory: Sensation is intact.     Motor: Motor function is intact.     Coordination: Coordination is intact.     Gait: Gait is intact.  Psychiatric:        Mood and Affect: Mood normal.     IMPRESSION / MDM / ASSESSMENT AND PLAN / ED COURSE  I reviewed the triage vital signs and the nursing notes.  On arrival afebrile, blood pressure 96/57 and 94% on room air.  Has a nonfocal neurologic exam.  Differential diagnosis including dehydration, orthostatic hypotension, intracranial hemorrhage, postconcussive syndrome, infectious process, ACS, pneumonia, electrolyte abnormality   EKG  I, Clotilda Punter, the attending physician, personally viewed and interpreted this ECG.  EKG showed normal sinus rhythm with PVCs present.  Appears to have an incomplete right bundle branch  block.  No significant ST elevation or depression.  No significant change when compared to prior EKG.  Narrow complex, normal QTc at 434.  No tachycardic or bradycardic dysrhythmias while on cardiac telemetry.  RADIOLOGY I independently reviewed imaging, my interpretation of imaging: CT scan of the head without signs of intracranial hemorrhage  Chest x-ray read as no acute findings and unchanged asymmetric elevation of the right hemidiaphragm.  CT cervical spine with no acute fracture or signs of instability  LABS (all labs ordered are listed, but only abnormal results are displayed) Labs interpreted as -    Labs Reviewed  BASIC METABOLIC PANEL WITH GFR - Abnormal; Notable for the following components:      Result Value   Glucose, Bld 168 (*)    Calcium  8.8 (*)    All other components within normal limits  URINALYSIS, W/ REFLEX TO CULTURE (INFECTION SUSPECTED) - Abnormal; Notable for the following components:   Color, Urine YELLOW (*)    APPearance CLEAR (*)    Glucose, UA >=500 (*)    All other components within normal limits  CBC  TROPONIN I (HIGH SENSITIVITY)  TROPONIN I (HIGH SENSITIVITY)     MDM  No leukocytosis or anemia.  Initial troponin is negative and have a low suspicion for ACS.  EKG without findings of acute ischemia or dysrhythmia.  Plan for orthostatic blood pressures and if continues to be positive we will give IV fluids and reevaluate.  Concern for possible postconcussive syndrome.  No nystagmus no dysmetria on exam, have a lower suspicion for dissection.  Clinical Course as of 10/28/23 1528  Sun Oct 28, 2023  1336 No findings of urinary tract infection.  Troponin is negative.  Currently chest pain-free.  No significant leukocytosis or anemia.  Creatinine at baseline with no significant electrolyte abnormalities.  Orthostatic blood pressures were positive.  Will give IV fluids and reevaluate. [SM]  1527 On reevaluation patient states he is feeling better.   Continues to have some lightheadedness whenever he is walking but states he is feeling better.  No room spinning dizziness and no vertiginous symptoms.  Continues to have a normal neurologic exam with no nystagmus.  Repeat blood pressure significantly improved.  Concern for possible postconcussive syndrome.  No obvious infectious source and urine with no signs of urinary tract infection.  No chest  pain or shortness of breath.  Discussed calling primary care physician tomorrow to schedule close follow-up appointment.  Discussed return to the emergency department if he has any return of symptoms or worsening symptoms.  No questions at time of discharge. [SM]    Clinical Course User Index [SM] Suzanne Kirsch, MD     PROCEDURES:  Critical Care performed: No  Procedures  Patient's presentation is most consistent with acute presentation with potential threat to life or bodily function.   MEDICATIONS ORDERED IN ED: Medications  acetaminophen  (TYLENOL ) tablet 1,000 mg (1,000 mg Oral Given 10/28/23 1124)  sodium chloride  0.9 % bolus 1,000 mL (0 mLs Intravenous Stopped 10/28/23 1400)    FINAL CLINICAL IMPRESSION(S) / ED DIAGNOSES   Final diagnoses:  Fall, initial encounter  Injury of head, initial encounter  Lightheaded     Rx / DC Orders   ED Discharge Orders     None        Note:  This document was prepared using Dragon voice recognition software and may include unintentional dictation errors.   Suzanne Kirsch, MD 10/28/23 [phone]

## 2023-10-31 ENCOUNTER — Encounter (HOSPITAL_COMMUNITY): Payer: Self-pay | Admitting: Psychiatry

## 2023-10-31 ENCOUNTER — Telehealth (HOSPITAL_BASED_OUTPATIENT_CLINIC_OR_DEPARTMENT_OTHER): Admitting: Psychiatry

## 2023-10-31 DIAGNOSIS — F419 Anxiety disorder, unspecified: Secondary | ICD-10-CM

## 2023-10-31 DIAGNOSIS — F331 Major depressive disorder, recurrent, moderate: Secondary | ICD-10-CM

## 2023-10-31 MED ORDER — BUPROPION HCL ER (XL) 300 MG PO TB24: 300.0000 mg | ORAL_TABLET | Freq: Every day | ORAL | 0 refills | Status: DC

## 2023-10-31 MED ORDER — ARIPIPRAZOLE 5 MG PO TABS: ORAL_TABLET | ORAL | 0 refills | Status: DC

## 2023-10-31 NOTE — Progress Notes (Signed)
 [facility] MD Virtual Progress Note   Patient Location: Home Provider Location: Home Office  I connect with patient by video and verified that I am speaking with correct person by using two identifiers. I discussed the limitations of evaluation and management by telemedicine and the availability of in person appointments. I also discussed with the patient that there may be a patient responsible charge related to this service. The patient expressed understanding and agreed to proceed.  [name] [id number] y.o.  10/31/2023 9:48 AM  History of Present Illness:  Patient is evaluated by video session.  He reported things are going okay.  He is taking Abilify  and Wellbutrin  which is helping his depression and anxiety.  He is also on Klonopin , BuSpar  and gabapentin  from his primary care.  Patient lives by himself but he had a good support from his son and his brother who live close by.  He is involved in church activities.  Patient told recently he had a fall when he had a trip and had headaches and needed to be evaluated in the emergency room.  No new medication added of discontinued.  He is feeling fine.  Recently had a blood work with his primary care.  Hemoglobin A1c improved to 6.4 from 6.9.  He is trying to lose weight but actually he gained few pounds and now like to go back to watch his calorie intake.  Denies drinking or using any illegal substances.  Denies any panic attack, crying spells or any feeling of hopelessness or worthlessness.  Like to keep his current medication.  Past Psychiatric History: H/O depression and anxiety.  No h/o suicidal attempt or inpatient treatment.  Seen Dr. Kapoor and given Zoloft , Klonopin  and Cymbalta .  Later PCP added BuSpar  and trazodone .  We tried Lamictal  but did not help.     Outpatient Encounter Medications as of 10/31/2023  Medication Sig   albuterol  (VENTOLIN  HFA) 108 (90 Base) MCG/ACT inhaler INHALE 2 PUFFS BY MOUTH EVERY 6 HOURS  AS NEEDED FOR WHEEZING OR SHORTNESS OF BREATH   amLODipine  (NORVASC ) 5 MG tablet Take 1 tablet (5 mg total) by mouth daily.   ARIPiprazole  (ABILIFY ) 5 MG tablet Take one tab at bed time   aspirin  EC 81 MG tablet Take 1 tablet (81 mg total) by mouth daily at 6 PM. Swallow whole.   Blood Glucose Monitoring Suppl (ACCU-CHEK AVIVA PLUS) w/Device KIT Use to check blood sugars up to 4 times daily. ICD-10 E11.40   buPROPion  (WELLBUTRIN  XL) 300 MG 24 hr tablet Take 1 tablet (300 mg total) by mouth daily.   busPIRone  (BUSPAR ) 15 MG tablet Take 1 tablet (15 mg total) by mouth 3 (three) times daily.   carvedilol  (COREG ) 25 MG tablet Take 1 tablet (25 mg total) by mouth 2 (two) times daily.   celecoxib  (CELEBREX ) 100 MG capsule Take 1 capsule by mouth twice daily   clonazePAM  (KLONOPIN ) 0.5 MG tablet 1 tablets in the morning,  2 tablets in the evening   dicyclomine  (BENTYL ) 20 MG tablet Take 1 tablet (20 mg total) by mouth every 6 (six) hours.   ezetimibe  (ZETIA ) 10 MG tablet Take 1 tablet (10 mg total) by mouth daily.   folic acid  (FOLVITE ) 1 MG tablet Take 1 tablet (1 mg total) by mouth daily.   furosemide  (LASIX ) 20 MG tablet Take 2 tablets (40 mg total) by mouth daily.   gabapentin  (NEURONTIN ) 300 MG capsule Take 3 capsules (900 mg total) by mouth 3 (three)  times daily.   glipiZIDE  2.5 MG TABS Take 1 tablet by mouth 2 (two) times daily before a meal.   glucose blood (ACCU-CHEK AVIVA PLUS) test strip Use to check blood sugars up to 4 times daily. ICD-10 E11.40   hydrALAZINE  (APRESOLINE ) 25 MG tablet Take 1 tablet (25 mg total) by mouth 3 (three) times daily.   HYDROcodone -acetaminophen  (NORCO) 10-325 MG tablet Take 1 tablet by mouth daily as needed.   isosorbide  mononitrate (IMDUR ) 30 MG 24 hr tablet Take 1 tablet by mouth once daily   JARDIANCE  10 MG TABS tablet TAKE ONE TABLET BY MOUTH DAILY   Lancets (ACCU-CHEK MULTICLIX) lancets Use to to check blood sugars up to 4 times daily. ICD-10: E11.40    levothyroxine  (SYNTHROID ) 88 MCG tablet Take 1 tablet (88 mcg total) by mouth daily before breakfast.   losartan  (COZAAR ) 100 MG tablet Take 1 tablet (100 mg total) by mouth daily.   metFORMIN  (GLUCOPHAGE ) 500 MG tablet Take 1 tablet (500 mg total) by mouth 2 (two) times daily with a meal.   methocarbamol  (ROBAXIN ) 500 MG tablet TAKE 1 TABLET BY MOUTH EVERY 8 HOURS AS NEEDED   Multiple Vitamin (MULTIVITAMIN WITH MINERALS) TABS tablet Take 1 tablet by mouth daily.   naloxone  (NARCAN ) nasal spray 4 mg/0.1 mL Use as needed for excessive sedation /opioid overdose   omeprazole  (PRILOSEC) 40 MG capsule TAKE 1 CAPSULE BY MOUTH IN THE MORNING AND 1 CAPSULE IN THE EVENING   promethazine  (PHENERGAN ) 12.5 MG tablet Take 1 tablet (12.5 mg total) by mouth every 4 (four) hours as needed for nausea or vomiting. (Patient taking differently: Take 12.5 mg by mouth every 8 (eight) hours as needed for nausea or vomiting.)   rosuvastatin  (CRESTOR ) 40 MG tablet Take 1 tablet (40 mg total) by mouth daily.   traZODone  (DESYREL ) 100 MG tablet TAKE 1 & 1/2 (ONE & ONE-HALF) TABLETS BY MOUTH AT BEDTIME   No facility-administered encounter medications on file as of 10/31/2023.    Recent Results (from the past 2160 hours)  Basic metabolic panel with GFR     Status: Abnormal   Collection Time: 09/07/23  8:21 AM  Result Value Ref Range   Glucose 151 (H) 70 - 99 mg/dL   BUN 10 6 - 24 mg/dL   Creatinine, Ser 9.02 0.76 - 1.27 mg/dL   eGFR 90 >40 fO/fpw/8.26   BUN/Creatinine Ratio 10 9 - 20   Sodium 138 134 - 144 mmol/L   Potassium 4.8 3.5 - 5.2 mmol/L   Chloride 99 96 - 106 mmol/L   CO2 25 20 - 29 mmol/L   Calcium  9.0 8.7 - 10.2 mg/dL  Basic metabolic panel with GFR     Status: Abnormal   Collection Time: 10/02/23  2:06 PM  Result Value Ref Range   Glucose 103 (H) 70 - 99 mg/dL   BUN 11 6 - 24 mg/dL   Creatinine, Ser 8.92 0.76 - 1.27 mg/dL   eGFR 80 >40 fO/fpw/8.26   BUN/Creatinine Ratio 10 9 - 20   Sodium 142 134 -  144 mmol/L   Potassium 4.3 3.5 - 5.2 mmol/L   Chloride 101 96 - 106 mmol/L   CO2 21 20 - 29 mmol/L   Calcium  9.1 8.7 - 10.2 mg/dL  Lipid Profile     Status: Abnormal   Collection Time: 10/12/23  2:45 PM  Result Value Ref Range   Cholesterol, Total 80 (L) 100 - 199 mg/dL   Triglycerides 875 0 - 149  mg/dL   HDL 28 (L) >60 mg/dL   VLDL Cholesterol Cal 22 5 - 40 mg/dL   LDL Chol Calc (NIH) 30 0 - 99 mg/dL   LDL CALC COMMENT: Comment     Comment: See LDL Comment if reported.   Chol/HDL Ratio 2.9 0.0 - 5.0 ratio    Comment:                                   T. Chol/HDL Ratio                                             Men  Women                               1/2 Avg.Risk  3.4    3.3                                   Avg.Risk  5.0    4.4                                2X Avg.Risk  9.6    7.1                                3X Avg.Risk 23.4   11.0   Direct LDL     Status: None   Collection Time: 10/12/23  2:45 PM  Result Value Ref Range   LDL Direct 31 0 - 99 mg/dL  Comp Met (CMET)     Status: None   Collection Time: 10/12/23  2:45 PM  Result Value Ref Range   Glucose 80 70 - 99 mg/dL   BUN 14 6 - 24 mg/dL   Creatinine, Ser 8.92 0.76 - 1.27 mg/dL   eGFR 80 >40 fO/fpw/8.26   BUN/Creatinine Ratio 13 9 - 20   Sodium 140 134 - 144 mmol/L   Potassium 4.2 3.5 - 5.2 mmol/L   Chloride 99 96 - 106 mmol/L   CO2 26 20 - 29 mmol/L   Calcium  9.1 8.7 - 10.2 mg/dL   Total Protein 6.6 6.0 - 8.5 g/dL   Albumin  4.4 3.8 - 4.9 g/dL   Globulin, Total 2.2 1.5 - 4.5 g/dL   Bilirubin Total 0.5 0.0 - 1.2 mg/dL   Alkaline Phosphatase 99 44 - 121 IU/L   AST 23 0 - 40 IU/L   ALT 24 0 - 44 IU/L  HgB A1c     Status: Abnormal   Collection Time: 10/12/23  2:45 PM  Result Value Ref Range   Hgb A1c MFr Bld 6.4 (H) 4.8 - 5.6 %    Comment:          Prediabetes: 5.7 - 6.4          Diabetes: >6.4          Glycemic control for adults with diabetes: <7.0    Est. average glucose Bld gHb Est-mCnc 137 mg/dL   Urine Microalbumin w/creat. ratio     Status: None   Collection Time: 10/12/23  2:45 PM  Result Value Ref Range   Creatinine, Urine 50.9 Not Estab. mg/dL   Microalbumin, Urine 4.1 Not Estab. ug/mL   Microalb/Creat Ratio 8 0 - 29 mg/g creat    Comment:                        Normal:                0 -  29                        Moderately increased: 30 - 300                        Severely increased:       >300   TSH     Status: None   Collection Time: 10/12/23  2:45 PM  Result Value Ref Range   TSH 1.960 0.450 - 4.500 uIU/mL  Basic metabolic panel     Status: Abnormal   Collection Time: 10/28/23 10:49 AM  Result Value Ref Range   Sodium 137 135 - 145 mmol/L   Potassium 4.4 3.5 - 5.1 mmol/L   Chloride 101 98 - 111 mmol/L   CO2 28 22 - 32 mmol/L   Glucose, Bld 168 (H) 70 - 99 mg/dL    Comment: Glucose reference range applies only to samples taken after fasting for at least 8 hours.   BUN 18 6 - 20 mg/dL   Creatinine, Ser 8.76 0.61 - 1.24 mg/dL   Calcium  8.8 (L) 8.9 - 10.3 mg/dL   GFR, Estimated >39 >39 mL/min    Comment: (NOTE) Calculated using the CKD-EPI Creatinine Equation (2021)    Anion gap 8 5 - 15    Comment: Performed at [facility], [address] [place], [place] [address]  CBC     Status: None   Collection Time: 10/28/23 10:49 AM  Result Value Ref Range   WBC 9.9 4.0 - 10.5 K/uL   RBC 4.60 4.22 - 5.81 MIL/uL   Hemoglobin 13.9 13.0 - 17.0 g/dL   HCT 56.8 60.9 - 47.9 %   MCV 93.7 80.0 - 100.0 fL   MCH 30.2 26.0 - 34.0 pg   MCHC 32.3 30.0 - 36.0 g/dL   RDW 85.8 88.4 - 84.4 %   Platelets 218 150 - 400 K/uL   nRBC 0.0 0.0 - 0.2 %    Comment: Performed at [facility], [address] [place], [place] [address]  Troponin I (High Sensitivity)     Status: None   Collection Time: 10/28/23 10:49 AM  Result Value Ref Range   Troponin I (High Sensitivity) 7 <18 ng/L    Comment: (NOTE) Elevated high sensitivity troponin I (hsTnI) values and  significant  changes across serial measurements may suggest ACS but many other  chronic and acute conditions are known to elevate hsTnI results.  Refer to the Links section for chest pain algorithms and additional  guidance. Performed at [facility], [address] [place], [place] [address]   Urinalysis, w/ Reflex to Culture (Infection Suspected) -Urine, Clean Catch     Status: Abnormal   Collection Time: 10/28/23 12:18 PM  Result Value Ref Range   Specimen Source URINE, CLEAN CATCH    Color, Urine YELLOW (A) YELLOW   APPearance CLEAR (A) CLEAR   Specific Gravity, Urine 1.011 1.005 - 1.030   pH 5.0 5.0 - 8.0  Glucose, UA >=500 (A) NEGATIVE mg/dL   Hgb urine dipstick NEGATIVE NEGATIVE   Bilirubin Urine NEGATIVE NEGATIVE   Ketones, ur NEGATIVE NEGATIVE mg/dL   Protein, ur NEGATIVE NEGATIVE mg/dL   Nitrite NEGATIVE NEGATIVE   Leukocytes,Ua NEGATIVE NEGATIVE   RBC / HPF 0-5 0 - 5 RBC/hpf   WBC, UA 0-5 0 - 5 WBC/hpf    Comment:        Reflex urine culture not performed if WBC <=10, OR if Squamous epithelial cells >5. If Squamous epithelial cells >5 suggest recollection.    Bacteria, UA NONE SEEN NONE SEEN   Squamous Epithelial / HPF 0 0 - 5 /HPF   Mucus PRESENT    Hyaline Casts, UA PRESENT     Comment: Performed at [facility], [address] [place], [place] [address]  Troponin I (High Sensitivity)     Status: None   Collection Time: 10/28/23 12:34 PM  Result Value Ref Range   Troponin I (High Sensitivity) 6 <18 ng/L    Comment: (NOTE) Elevated high sensitivity troponin I (hsTnI) values and significant  changes across serial measurements may suggest ACS but many other  chronic and acute conditions are known to elevate hsTnI results.  Refer to the Links section for chest pain algorithms and additional  guidance. Performed at [facility], [address], [place], [place] [address]      Psychiatric Specialty Exam: Physical Exam   Review of Systems  Musculoskeletal:        Knee pain    Weight 244 lb (110.7 kg).There is no height or weight on file to calculate BMI.  General Appearance: Casual  Eye Contact:  Fair  Speech:  Normal Rate  Volume:  Normal  Mood:  Euthymic  Affect:  Appropriate  Thought Process:  Goal Directed  Orientation:  Full (Time, Place, and Person)  Thought Content:  WDL  Suicidal Thoughts:  No  Homicidal Thoughts:  No  Memory:  Immediate;   Good Recent;   Good Remote;   Fair  Judgement:  Intact  Insight:  Present  Psychomotor Activity:  Normal  Concentration:  Concentration: Fair and Attention Span: Fair  Recall:  Good  Fund of Knowledge:  Good  Language:  Good  Akathisia:  No  Handed:  Right  AIMS (if indicated):     Assets:  Communication Skills Desire for Improvement Housing Social Support Transportation  ADL's:  Intact  Cognition:  WNL  Sleep:  ok       10/12/2023    2:08 PM 07/11/2023    3:43 PM 04/11/2023    3:46 PM 02/27/2023    2:58 PM 01/16/2023    8:23 AM  Depression screen PHQ 2/9  Decreased Interest 1 1 1 1 1   Down, Depressed, Hopeless 1 1 1 1 1   PHQ - 2 Score 2 2 2 2 2   Altered sleeping 0 0 0 0 0  Tired, decreased energy 1 1 1 1 1   Change in appetite 1 0 1 0 0  Feeling bad or failure about yourself  0 0 0 0 0  Trouble concentrating 1 1 1 1 1   Moving slowly or fidgety/restless 0 0 0 0 0  Suicidal thoughts 0 0 0 0 0  PHQ-9 Score 5 4 5 4 4   Difficult doing work/chores Somewhat difficult Somewhat difficult Somewhat difficult Not difficult at all Not difficult at all    Assessment/Plan: MDD (major depressive disorder), recurrent episode, moderate (HCC) - Plan: ARIPiprazole  (ABILIFY )  5 MG tablet, buPROPion  (WELLBUTRIN  XL) 300 MG 24 hr tablet  Anxiety - Plan: buPROPion  (WELLBUTRIN  XL) 300 MG 24 hr tablet  Review blood work results and collateral information from emergency room and other providers.  Hemoglobin A1c improved from the past.  However gained few  pounds but like to go back to watch his calorie intake.  Discussed polypharmacy as patient is taking trazodone , Klonopin  and, BuSpar  and gabapentin  from his PCP and we have provided Wellbutrin  and Abilify .  Patient is very reluctant to cut down the medication.  I explained going on the most common side effects is postural hypotension from the medication.  However patient feels comfortable with the current dose.  He is not interested in therapy.  Will continue Wellbutrin  XL 300 mg daily and Abilify  5 mg daily.  Recommend to call us  back with any question or any concern.  Follow-up in 3 months.  He is getting trazodone , Klonopin , BuSpar  and gabapentin  for his primary care.   Follow Up Instructions:     I discussed the assessment and treatment plan with the patient. The patient was provided an opportunity to ask questions and all were answered. The patient agreed with the plan and demonstrated an understanding of the instructions.   The patient was advised to call back or seek an in-person evaluation if the symptoms worsen or if the condition fails to improve as anticipated.    Collaboration of Care: Other provider involved in patient's care AEB notes are available in epic to review  Patient/Guardian was advised Release of Information must be obtained prior to any record release in order to collaborate their care with an outside provider. Patient/Guardian was advised if they have not already done so to contact the registration department to sign all necessary forms in order for us  to release information regarding their care.   Consent: Patient/Guardian gives verbal consent for treatment and assignment of benefits for services provided during this visit. Patient/Guardian expressed understanding and agreed to proceed.     Total encounter time 18 minutes which includes face-to-face time, chart reviewed, care coordination, order entry and documentation during this encounter.   Note: This document was  prepared by [employer] voice dictation technology and any errors that results from this process are unintentional.    Leni ONEIDA Client, MD 10/31/2023

## 2023-11-01 ENCOUNTER — Other Ambulatory Visit: Payer: Self-pay | Admitting: Internal Medicine

## 2023-11-06 ENCOUNTER — Telehealth: Admitting: Family

## 2023-11-06 ENCOUNTER — Other Ambulatory Visit: Payer: Self-pay | Admitting: Surgical

## 2023-11-06 ENCOUNTER — Telehealth: Payer: Self-pay

## 2023-11-06 NOTE — Telephone Encounter (Signed)
LVM to call back to begin MCVV

## 2023-11-06 NOTE — Progress Notes (Unsigned)
 Virtual Visit via Video Note  I connected with [name] on 11/07/23 at 11:30 AM EDT by a video enabled telemedicine application and verified that I am speaking with the correct person using two identifiers. Location patient: home Location provider: work  Persons participating in the virtual visit: patient, provider  I discussed the limitations of evaluation and management by telemedicine and the availability of in person appointments. The patient expressed understanding and agreed to proceed.  HPI: Seen in ED 10/28/23 for fall   A/w with CP, dizziness  H/o neuropahty  D/D dehydration, orthostatic hypotension, ICH,  CXR  CT head and C spine Troponin negative  H/o CAD, OSA, DM ROS: See pertinent positives and negatives per HPI.  EXAM:  VITALS per patient if applicable: Ht 6' (1.829 m)   Wt 239 lb 12.8 oz (108.8 kg)   BMI 32.52 kg/m  BP Readings from Last 3 Encounters:  10/28/23 (!) 96/57  10/12/23 (!) 140/80  10/09/23 128/60   Wt Readings from Last 3 Encounters:  11/06/23 239 lb 12.8 oz (108.8 kg)  10/28/23 244 lb 12.8 oz (111 kg)  10/12/23 244 lb 12.8 oz (111 kg)    GENERAL: alert, oriented, appears well and in no acute distress  HEENT: atraumatic, conjunttiva clear, no obvious abnormalities on inspection of external nose and ears  NECK: normal movements of the head and neck  LUNGS: on inspection no signs of respiratory distress, breathing rate appears normal, no obvious gross SOB, gasping or wheezing  CV: no obvious cyanosis  MS: moves all visible extremities without noticeable abnormality  PSYCH/NEURO: pleasant and cooperative, no obvious depression or anxiety, speech and thought processing grossly intact  ASSESSMENT AND PLAN: Erroneous encounter - disregard     -we discussed possible serious and likely etiologies, options for evaluation and workup, limitations of telemedicine visit vs in person visit, treatment, treatment risks and precautions. Pt  prefers to treat via telemedicine empirically rather then risking or undertaking an in person visit at this moment.    I discussed the assessment and treatment plan with the patient. The patient was provided an opportunity to ask questions and all were answered. The patient agreed with the plan and demonstrated an understanding of the instructions.   The patient was advised to call back or seek an in-person evaluation if the symptoms worsen or if the condition fails to improve as anticipated.  Advised if desired AVS can be mailed or viewed via MyChart if Mychart user.   Joshua Northern, FNP  This encounter was created in error - please disregard.

## 2023-11-09 ENCOUNTER — Telehealth (INDEPENDENT_AMBULATORY_CARE_PROVIDER_SITE_OTHER): Admitting: Nurse Practitioner

## 2023-11-09 ENCOUNTER — Encounter: Payer: Self-pay | Admitting: Nurse Practitioner

## 2023-11-09 VITALS — BP 96/57 | Ht 72.0 in | Wt 239.0 lb

## 2023-11-09 DIAGNOSIS — R42 Dizziness and giddiness: Secondary | ICD-10-CM | POA: Diagnosis not present

## 2023-11-09 NOTE — Progress Notes (Signed)
 Virtual Visit via Video Note  I connected with [name] on 11/09/2023 by a video enabled telemedicine application and verified that I am speaking with the correct person using two identifiers.  Patient Location: Home Provider Location: Office/Clinic  I discussed the limitations, risks, security, and privacy concerns of performing an evaluation and management service by video and the availability of in person appointments. I also discussed with the patient that there may be a patient responsible charge related to this service. The patient expressed understanding and agreed to proceed.  Subjective: PCP: Marylynn Verneita CROME, MD  Chief Complaint  Patient presents with   Follow-up  Discussed the use of a AI scribe software for clinical note transcription with the patient, who gave verbal consent to proceed.  HPI [name] is a 58 year old male who presents for an ED follow-up after a fall.  He tripped over a rug, fell backwards, and hit his head. A CT scan of the head and spine showed no abnormalities. He experienced dizziness the day after the fall, prompting an ED visit, but has had no dizziness or headaches since.  During the ED visit, he experienced chest pain, which has since resolved.  He has persistent unilateral leg swelling, managed with leg elevation and furosemide , 20 mg, two tablets every morning. There is no shortness of breath or weight change associated with the swelling.   ROS: Per HPI  Current Outpatient Medications:    amLODipine  (NORVASC ) 5 MG tablet, Take 1 tablet (5 mg total) by mouth daily., Disp: 90 tablet, Rfl: 3   ARIPiprazole  (ABILIFY ) 5 MG tablet, Take one tab at bed time, Disp: 90 tablet, Rfl: 0   aspirin  EC 81 MG tablet, Take 1 tablet (81 mg total) by mouth daily at 6 PM. Swallow whole., Disp: 30 tablet, Rfl: 12   Blood Glucose Monitoring Suppl (ACCU-CHEK AVIVA PLUS) w/Device KIT, Use to check blood sugars up to 4 times daily. ICD-10 E11.40,  Disp: 1 kit, Rfl: 0   buPROPion  (WELLBUTRIN  XL) 300 MG 24 hr tablet, Take 1 tablet (300 mg total) by mouth daily., Disp: 90 tablet, Rfl: 0   busPIRone  (BUSPAR ) 15 MG tablet, Take 1 tablet (15 mg total) by mouth 3 (three) times daily., Disp: 270 tablet, Rfl: 3   carvedilol  (COREG ) 25 MG tablet, Take 1 tablet (25 mg total) by mouth 2 (two) times daily., Disp: 180 tablet, Rfl: 2   celecoxib  (CELEBREX ) 100 MG capsule, Take 1 capsule by mouth twice daily, Disp: 60 capsule, Rfl: 0   clonazePAM  (KLONOPIN ) 0.5 MG tablet, 1 tablets in the morning,  2 tablets in the evening, Disp: 90 tablet, Rfl: 2   dicyclomine  (BENTYL ) 20 MG tablet, TAKE 1 TABLET BY MOUTH EVERY 6 HOURS, Disp: 360 tablet, Rfl: 0   ezetimibe  (ZETIA ) 10 MG tablet, Take 1 tablet (10 mg total) by mouth daily., Disp: 90 tablet, Rfl: 3   folic acid  (FOLVITE ) 1 MG tablet, Take 1 tablet (1 mg total) by mouth daily., Disp: 90 tablet, Rfl: 0   furosemide  (LASIX ) 20 MG tablet, Take 2 tablets (40 mg total) by mouth daily., Disp: 180 tablet, Rfl: 3   gabapentin  (NEURONTIN ) 300 MG capsule, Take 3 capsules (900 mg total) by mouth 3 (three) times daily., Disp: 810 capsule, Rfl: 0   glipiZIDE  2.5 MG TABS, Take 1 tablet by mouth 2 (two) times daily before a meal., Disp: 180 tablet, Rfl: 1   glucose blood (ACCU-CHEK AVIVA PLUS) test strip, Use  to check blood sugars up to 4 times daily. ICD-10 E11.40, Disp: 400 each, Rfl: 0   hydrALAZINE  (APRESOLINE ) 25 MG tablet, Take 1 tablet (25 mg total) by mouth 3 (three) times daily., Disp: 270 tablet, Rfl: 1   HYDROcodone -acetaminophen  (NORCO) 10-325 MG tablet, Take 1 tablet by mouth daily as needed., Disp: 30 tablet, Rfl: 0   isosorbide  mononitrate (IMDUR ) 30 MG 24 hr tablet, Take 1 tablet by mouth once daily, Disp: 90 tablet, Rfl: 3   JARDIANCE  10 MG TABS tablet, TAKE ONE TABLET BY MOUTH DAILY, Disp: 90 tablet, Rfl: 0   Lancets (ACCU-CHEK MULTICLIX) lancets, Use to to check blood sugars up to 4 times daily. ICD-10:  E11.40, Disp: 400 each, Rfl: 0   levothyroxine  (SYNTHROID ) 88 MCG tablet, Take 1 tablet (88 mcg total) by mouth daily before breakfast., Disp: 90 tablet, Rfl: 1   losartan  (COZAAR ) 100 MG tablet, Take 1 tablet (100 mg total) by mouth daily., Disp: 90 tablet, Rfl: 1   metFORMIN  (GLUCOPHAGE ) 500 MG tablet, Take 1 tablet (500 mg total) by mouth 2 (two) times daily with a meal., Disp: 180 tablet, Rfl: 1   methocarbamol  (ROBAXIN ) 500 MG tablet, TAKE 1 TABLET BY MOUTH EVERY 8 HOURS AS NEEDED, Disp: 90 tablet, Rfl: 0   Multiple Vitamin (MULTIVITAMIN WITH MINERALS) TABS tablet, Take 1 tablet by mouth daily., Disp: , Rfl:    naloxone  (NARCAN ) nasal spray 4 mg/0.1 mL, Use as needed for excessive sedation /opioid overdose, Disp: 1 each, Rfl: 1   omeprazole  (PRILOSEC) 40 MG capsule, TAKE 1 CAPSULE BY MOUTH IN THE MORNING AND 1 CAPSULE IN THE EVENING, Disp: 180 capsule, Rfl: 3   promethazine  (PHENERGAN ) 12.5 MG tablet, Take 1 tablet (12.5 mg total) by mouth every 4 (four) hours as needed for nausea or vomiting., Disp: 20 tablet, Rfl: 0   rosuvastatin  (CRESTOR ) 40 MG tablet, Take 1 tablet (40 mg total) by mouth daily., Disp: 90 tablet, Rfl: 1   traZODone  (DESYREL ) 100 MG tablet, TAKE 1 & 1/2 (ONE & ONE-HALF) TABLETS BY MOUTH AT BEDTIME, Disp: 135 tablet, Rfl: 3   albuterol  (VENTOLIN  HFA) 108 (90 Base) MCG/ACT inhaler, INHALE 2 PUFFS BY MOUTH EVERY 6 HOURS AS NEEDED FOR WHEEZING OR SHORTNESS OF BREATH (Patient not taking: Reported on 11/09/2023), Disp: 9 g, Rfl: 0  Observations/Objective: Today's Vitals   11/09/23 1616  BP: (!) 96/57  Weight: 239 lb (108.4 kg)  Height: 6' (1.829 m)   Physical Exam Constitutional:      General: He is not in acute distress.    Appearance: Normal appearance. He is not ill-appearing.  Eyes:     Conjunctiva/sclera: Conjunctivae normal.  Pulmonary:     Effort: No respiratory distress.  Neurological:     Mental Status: He is alert.  Psychiatric:        Mood and Affect: Mood  normal.        Behavior: Behavior normal.        Thought Content: Thought content normal.        Judgment: Judgment normal.     Assessment and Plan: Dizziness Assessment & Plan: Dizziness has been resolved since ED visit.  No headaches.  He have unilateral leg swelling on furosemide  40 mg daily. No dyspnea or weight change. Not using compression therapy. - Recommend compression stockings or ACE wrap. - Advised patient to let us  know if swelling not improving      Follow Up Instructions: No follow-ups on file.   I discussed the  assessment and treatment plan with the patient. The patient was provided an opportunity to ask questions, and all were answered. The patient agreed with the plan and demonstrated an understanding of the instructions.   The patient was advised to call back or seek an in-person evaluation if the symptoms worsen or if the condition fails to improve as anticipated.  The above assessment and management plan was discussed with the patient. The patient verbalized understanding of and has agreed to the management plan.   Anjelo Pullman, NP

## 2023-11-14 ENCOUNTER — Ambulatory Visit: Admitting: Internal Medicine

## 2023-11-22 ENCOUNTER — Encounter: Payer: Self-pay | Admitting: Internal Medicine

## 2023-11-22 ENCOUNTER — Other Ambulatory Visit: Payer: Self-pay | Admitting: Internal Medicine

## 2023-11-23 DIAGNOSIS — W19XXXA Unspecified fall, initial encounter: Secondary | ICD-10-CM | POA: Insufficient documentation

## 2023-11-23 DIAGNOSIS — R42 Dizziness and giddiness: Secondary | ICD-10-CM | POA: Insufficient documentation

## 2023-11-23 NOTE — Telephone Encounter (Signed)
 Pt requesting refill has 4 pills left.  Requesting: hydrocodone   Contract: No UDS: no Last Visit: 10/12/2023 Next Visit: 01/16/2024 Last Refill: E-Prescribing Status: Receipt confirmed by pharmacy (10/12/2023  2:29 PM EDT)   Please Advise

## 2023-11-23 NOTE — Assessment & Plan Note (Signed)
 Dizziness has been resolved since ED visit.  No headaches.  He have unilateral leg swelling on furosemide  40 mg daily. No dyspnea or weight change. Not using compression therapy. - Recommend compression stockings or ACE wrap. - Advised patient to let us  know if swelling not improving

## 2023-11-25 MED ORDER — HYDROCODONE-ACETAMINOPHEN 10-325 MG PO TABS: 1.0000 | ORAL_TABLET | Freq: Every day | ORAL | 0 refills | Status: DC | PRN

## 2023-11-29 DIAGNOSIS — H25043 Posterior subcapsular polar age-related cataract, bilateral: Secondary | ICD-10-CM | POA: Diagnosis not present

## 2023-11-29 DIAGNOSIS — Z0100 Encounter for examination of eyes and vision without abnormal findings: Secondary | ICD-10-CM | POA: Diagnosis not present

## 2023-11-29 DIAGNOSIS — H2513 Age-related nuclear cataract, bilateral: Secondary | ICD-10-CM | POA: Diagnosis not present

## 2023-11-29 DIAGNOSIS — H43821 Vitreomacular adhesion, right eye: Secondary | ICD-10-CM | POA: Diagnosis not present

## 2023-11-29 DIAGNOSIS — E119 Type 2 diabetes mellitus without complications: Secondary | ICD-10-CM | POA: Diagnosis not present

## 2023-11-29 LAB — HM DIABETES EYE EXAM

## 2023-12-05 ENCOUNTER — Other Ambulatory Visit: Payer: Self-pay | Admitting: Surgical

## 2023-12-08 NOTE — Progress Notes (Unsigned)
 Cardiology Office Note  Date:  12/10/2023   ID:  [name], DOB 09-02-1965, MRN [id number]  PCP:  Marylynn Verneita CROME, MD   Chief Complaint  Patient presents with   3 month follow up     Patient c/o shortness of breath with exertion, bilateral LE edema and weakness with blood pressure fluctuating.     HPI:  [name] is a 58 year old gentleman with past medical history of Diabetes type 2 Hyperlipidemia Sleep apnea on CPAP CAD Cath 5/17 showed 30 Lcx Cardiac CTA 10/20 showed Ca score 50, mild (<50) D1, mild (<50) Lcx and minimal RCA.  Echocardiogram June 2022 showed normal LV function, mild right atrial enlargement.   Venous Dopplers November 2022 showed acute DVT on the left.   Chronic dyspnea on exertion  Who presents for follow-up of his coronary disease and DVT Second up Last last seen by myself in clinic 1/24 Seen by one of our providers May 2025  Echo January 2025 EF 55 to 60%, normal RV size and function, no significant valvular heart disease  Cardiac CTA April 2025 Calcium  score 138 Mild nonobstructive coronary disease  Disabled, fell off ladder Does car repair  Leg swelling trace R>L On lasix  40 daily  emergency room February 12, 2022 Anxiety,some chest tightness, workup in the ER negative Troponin negative  Lab work reviewed A1C 6.4 LDL 31 on crestor  zetia   EKG personally reviewed by myself on todays visit EKG Interpretation Date/Time:  Monday December 10 2023 08:01:45 EDT Ventricular Rate:  71 PR Interval:  158 QRS Duration:  110 QT Interval:  434 QTC Calculation: 471 R Axis:   250  Text Interpretation: Normal sinus rhythm When compared with ECG of 28-Oct-2023 10:44, Premature ventricular complexes are no longer Present Confirmed by Perla Lye [phone]) on 12/10/2023 8:21:18 AM    PMH:   has a past medical history of Acute medial meniscus tear of left knee, Alcohol withdrawal delirium (HCC) (08/13/2022), 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  PSH:    Past Surgical History:  Procedure Laterality Date   APPENDECTOMY  2002   done at [facility]   CARDIAC CATHETERIZATION     CARDIAC CATHETERIZATION N/A 08/27/2015   Procedure: Left Heart Cath and Coronary Angiography;  Surgeon: Toribio JONELLE Fuel, MD;  Location: [facility] INVASIVE CV LAB;  Service: Cardiovascular;  Laterality: N/A;   COLONOSCOPY  in his 20's   COLONOSCOPY  2019   COLONOSCOPY WITH PROPOFOL  N/A 05/31/2023   Procedure: COLONOSCOPY WITH PROPOFOL ;  Surgeon: Jinny Carmine, MD;  Location: [facility] ENDOSCOPY;  Service: Endoscopy;  Laterality: N/A;   EAR CYST EXCISION Left 07/08/2019   Procedure: open excision baker's cyst left knee;  Surgeon: Addie Cordella Hamilton, MD;  Location[facility] OR;  Service: Orthopedics;  Laterality: Left;   HERNIA REPAIR  05/15/2013   umbilical   JOINT REPLACEMENT     right knee   KNEE ARTHROSCOPY Left 12/17/2018   Procedure: left knee arthroscopy, meniscal debridement, loose body removal;  Surgeon: Addie Cordella Hamilton, MD;  Location: [facility] OR;  Service: Orthopedics;  Laterality: Left;   POLYPECTOMY  05/31/2023   Procedure: POLYPECTOMY;  Surgeon: Jinny Carmine, MD;  Location: [facility] ENDOSCOPY;  Service: Endoscopy;;   REVERSE SHOULDER ARTHROPLASTY Right 08/10/2022   Procedure: RIGHT REVERSE SHOULDER ARTHROPLASTY, BICEPS TENODESIS;  Surgeon: Addie Cordella Hamilton, MD;  Location: [facility] OR;  Service: Orthopedics;  Laterality: Right;   REVISION TOTAL KNEE ARTHROPLASTY Right 06/23/2014    DR ADDIE   SHOULDER ARTHROSCOPY WITH ROTATOR CUFF REPAIR AND SUBACROMIAL DECOMPRESSION Right 03/07/2022   Procedure: RIGHT SHOULDER ARTHROSCOPY, DEBRIDEMENT, MINI OPEN ROTATOR CUFF TEAR REPAIR;  Surgeon: Addie Cordella Hamilton, MD;  Location: [facility] OR;  Service: Orthopedics;  Laterality: Right;   TONSILLECTOMY     TOTAL KNEE ARTHROPLASTY Right 2011   right   TOTAL KNEE ARTHROPLASTY Left 02/10/2021   Procedure: LEFT TOTAL KNEE ARTHROPLASTY;  Surgeon: Addie Cordella Hamilton, MD;  [facility] OR;  Service: Orthopedics;  Laterality: Left;   TOTAL KNEE REVISION Right 06/23/2014   Procedure: TOTAL KNEE REVISION;  Surgeon: Cordella Hamilton Addie, MD;  Location: [facility] OR;  Service: Orthopedics;  Laterality: Right;   UPPER GASTROINTESTINAL ENDOSCOPY  04/30/2013   Jakie QUERY TOOTH EXTRACTION      Current Outpatient Medications  Medication Sig Dispense Refill   albuterol  (VENTOLIN  HFA) 108 (90 Base) MCG/ACT inhaler INHALE 2 PUFFS BY MOUTH EVERY 6 HOURS AS NEEDED FOR WHEEZING OR SHORTNESS OF BREATH 9 g 0   amLODipine  (NORVASC ) 5 MG tablet Take 1 tablet (5 mg total) by mouth daily. 90 tablet 3   ARIPiprazole  (ABILIFY ) 5 MG tablet Take one tab at bed time 90 tablet 0   aspirin  EC 81 MG tablet Take 1 tablet (81 mg total) by mouth daily at 6 PM. Swallow whole. 30 tablet 12   Blood Glucose Monitoring Suppl (ACCU-CHEK AVIVA PLUS) w/Device KIT Use to check blood sugars up to 4 times daily. ICD-10 E11.40 1 kit 0   buPROPion  (WELLBUTRIN  XL) 300 MG 24 hr tablet Take 1 tablet (300 mg total) by mouth daily. 90 tablet 0   busPIRone  (BUSPAR ) 15 MG tablet Take 1 tablet (15 mg total) by mouth 3 (three) times daily. 270 tablet 3   carvedilol  (COREG ) 25 MG tablet Take 1 tablet (25 mg total) by mouth 2 (two) times daily. 180 tablet 2   celecoxib  (CELEBREX ) 100 MG capsule Take 1 capsule by mouth twice daily 60 capsule 0   clonazePAM  (KLONOPIN ) 0.5 MG tablet 1 tablets in the morning,  2 tablets in the evening 90 tablet 2    dicyclomine  (BENTYL ) 20 MG tablet TAKE 1 TABLET BY MOUTH EVERY 6 HOURS 360 tablet 0   ezetimibe  (ZETIA ) 10 MG tablet Take 1 tablet (10 mg total) by mouth daily. 90 tablet 3   folic acid  (FOLVITE ) 1 MG tablet Take 1 tablet (1 mg total) by mouth daily. 90 tablet 0   furosemide  (LASIX ) 20 MG tablet Take 2 tablets (40 mg total) by mouth daily. 180 tablet 3   gabapentin  (NEURONTIN ) 300 MG capsule Take 3 capsules (900 mg total) by mouth 3 (three) times daily. 810 capsule 0   glipiZIDE  2.5 MG TABS Take 1 tablet by mouth 2 (two) times daily before a meal. 180 tablet 1   glucose blood (ACCU-CHEK AVIVA PLUS) test strip Use to check blood sugars up to 4 times daily. ICD-10 E11.40 400 each 0   hydrALAZINE  (APRESOLINE ) 25 MG tablet Take 1 tablet (25 mg total) by mouth 3 (three) times daily. 270 tablet 1   HYDROcodone -acetaminophen  (NORCO) 10-325 MG tablet Take 1 tablet by mouth daily as needed. 30 tablet 0   isosorbide  mononitrate (IMDUR ) 30  MG 24 hr tablet Take 1 tablet by mouth once daily 90 tablet 3   JARDIANCE  10 MG TABS tablet TAKE ONE TABLET BY MOUTH DAILY 90 tablet 0   Lancets (ACCU-CHEK MULTICLIX) lancets Use to to check blood sugars up to 4 times daily. ICD-10: E11.40 400 each 0   levothyroxine  (SYNTHROID ) 88 MCG tablet Take 1 tablet (88 mcg total) by mouth daily before breakfast. 90 tablet 1   losartan  (COZAAR ) 100 MG tablet Take 1 tablet (100 mg total) by mouth daily. 90 tablet 1   metFORMIN  (GLUCOPHAGE ) 500 MG tablet Take 1 tablet (500 mg total) by mouth 2 (two) times daily with a meal. 180 tablet 1   methocarbamol  (ROBAXIN ) 500 MG tablet TAKE 1 TABLET BY MOUTH EVERY 8 HOURS AS NEEDED 90 tablet 0   Multiple Vitamin (MULTIVITAMIN WITH MINERALS) TABS tablet Take 1 tablet by mouth daily.     naloxone  (NARCAN ) nasal spray 4 mg/0.1 mL Use as needed for excessive sedation /opioid overdose 1 each 1   omeprazole  (PRILOSEC) 40 MG capsule TAKE 1 CAPSULE BY MOUTH IN THE MORNING AND 1 CAPSULE IN THE EVENING 180  capsule 3   promethazine  (PHENERGAN ) 12.5 MG tablet Take 1 tablet (12.5 mg total) by mouth every 4 (four) hours as needed for nausea or vomiting. 20 tablet 0   rosuvastatin  (CRESTOR ) 40 MG tablet Take 1 tablet (40 mg total) by mouth daily. 90 tablet 1   traZODone  (DESYREL ) 100 MG tablet TAKE 1 & 1/2 (ONE & ONE-HALF) TABLETS BY MOUTH AT BEDTIME 135 tablet 3   No current facility-administered medications for this visit.     Allergies:   Ambien  [zolpidem ]   Social History:  The patient  reports that he quit smoking about 18 years ago. His smoking use included cigarettes. He started smoking about 36 years ago. He has a 36 pack-year smoking history. He has never used smokeless tobacco. He reports current alcohol use of about 4.0 standard drinks of alcohol per week. He reports that he does not use drugs.   Family History:   family history includes Heart disease in his father.    Review of Systems: Review of Systems  Constitutional: Negative.   HENT: Negative.    Respiratory: Negative.    Cardiovascular:  Positive for chest pain.  Gastrointestinal: Negative.   Musculoskeletal: Negative.   Neurological: Negative.   Psychiatric/Behavioral: Negative.    All other systems reviewed and are negative.  PHYSICAL EXAM: VS:  BP (!) 106/48 (BP Location: Right Arm, Patient Position: Sitting, Cuff Size: Normal)   Pulse 71   Ht 6' (1.829 m)   Wt 242 lb 8 oz (110 kg)   SpO2 94%   BMI 32.89 kg/m  , BMI Body mass index is 32.89 kg/m. GEN: Well nourished, well developed, in no acute distress HEENT: normal Neck: no JVD, carotid bruits, or masses Cardiac: RRR; no murmurs, rubs, or gallops, trace lower extremity edema worse on the right and the left Respiratory:  clear to auscultation bilaterally, normal work of breathing GI: soft, nontender, nondistended, + BS MS: no deformity or atrophy Skin: warm and dry, no rash Neuro:  Strength and sensation are intact Psych: euthymic mood, full  affect  Recent Labs: 10/12/2023: ALT 24; TSH 1.960 10/28/2023: BUN 18; Creatinine, Ser 1.23; Hemoglobin 13.9; Platelets 218; Potassium 4.4; Sodium 137    Lipid Panel Lab Results  Component Value Date   CHOL 80 (L) 10/12/2023   HDL 28 (L) 10/12/2023   LDLCALC 30 10/12/2023  TRIG 124 10/12/2023      Wt Readings from Last 3 Encounters:  12/10/23 242 lb 8 oz (110 kg)  11/09/23 239 lb (108.4 kg)  11/06/23 239 lb 12.8 oz (108.8 kg)     ASSESSMENT AND PLAN:  Problem List Items Addressed This Visit       Cardiology Problems   CAD (coronary artery disease) - Primary   Relevant Orders   EKG 12-Lead (Completed)     Other   Diabetes mellitus treated with oral medication (HCC)   Morbid obesity (HCC)   Other Visit Diagnoses       Primary hypertension       Relevant Orders   EKG 12-Lead (Completed)     Hyperlipidemia LDL goal <70         Dyspnea on exertion       Relevant Orders   EKG 12-Lead (Completed)     Chest pain, unspecified type          Coronary artery disease Prior workup including cardiac catheterization and cardiac CTA showing nonobstructive disease Currently with no symptoms of angina. No further workup at this time. Continue current medication regimen.  Diabetes type 2 with complications A1C 6.4 We have encouraged continued exercise, careful diet management   Hyperlipidemia Cholesterol is at goal on the current lipid regimen. No changes to the medications were made.   Signed, Velinda Lunger, M.D., Ph.D. [facility] [place], [place] [phone]

## 2023-12-10 ENCOUNTER — Ambulatory Visit: Attending: Cardiovascular Disease | Admitting: Cardiovascular Disease

## 2023-12-10 ENCOUNTER — Encounter: Payer: Self-pay | Admitting: Cardiovascular Disease

## 2023-12-10 VITALS — BP 106/48 | HR 71 | Ht 72.0 in | Wt 242.5 lb

## 2023-12-10 DIAGNOSIS — I10 Essential (primary) hypertension: Secondary | ICD-10-CM | POA: Diagnosis not present

## 2023-12-10 DIAGNOSIS — R0609 Other forms of dyspnea: Secondary | ICD-10-CM

## 2023-12-10 DIAGNOSIS — E785 Hyperlipidemia, unspecified: Secondary | ICD-10-CM

## 2023-12-10 DIAGNOSIS — R079 Chest pain, unspecified: Secondary | ICD-10-CM | POA: Diagnosis not present

## 2023-12-10 DIAGNOSIS — Z7984 Long term (current) use of oral hypoglycemic drugs: Secondary | ICD-10-CM

## 2023-12-10 DIAGNOSIS — E119 Type 2 diabetes mellitus without complications: Secondary | ICD-10-CM

## 2023-12-10 DIAGNOSIS — I25118 Atherosclerotic heart disease of native coronary artery with other forms of angina pectoris: Secondary | ICD-10-CM

## 2023-12-10 MED ORDER — ISOSORBIDE MONONITRATE ER 30 MG PO TB24: 30.0000 mg | ORAL_TABLET | Freq: Every day | ORAL | 3 refills | Status: AC

## 2023-12-10 MED ORDER — FUROSEMIDE 40 MG PO TABS: 40.0000 mg | ORAL_TABLET | Freq: Every day | ORAL | 3 refills | Status: AC

## 2023-12-10 NOTE — Patient Instructions (Addendum)
 Medication Instructions:   Please hold the amlodipine  Continue to monitor blood pressure  If you need a refill on your cardiac medications before your next appointment, please call your pharmacy.   Lab work: No new labs needed  Testing/Procedures: No new testing needed  Follow-Up: At [facility], you and your health needs are our priority.  As part of our continuing mission to provide you with exceptional heart care, we have created designated Provider Care Teams.  These Care Teams include your primary Cardiologist (physician) and Advanced Practice Providers (APPs -  Physician Assistants and Nurse Practitioners) who all work together to provide you with the care you need, when you need it.  You will need a follow up appointment in 12 months  Providers on your designated Care Team:   Lonni Meager, NP Bernardino Bring, PA-C Cadence [place], [place]  COVID-19 Vaccine Information can be found at: [email or web address] For questions related to vaccine distribution or appointments, please email [email or web address] or call [phone].

## 2023-12-24 ENCOUNTER — Other Ambulatory Visit: Payer: Self-pay | Admitting: Internal Medicine

## 2023-12-24 ENCOUNTER — Encounter: Payer: Self-pay | Admitting: Internal Medicine

## 2023-12-25 ENCOUNTER — Other Ambulatory Visit: Payer: Self-pay

## 2023-12-25 MED ORDER — ACCU-CHEK SOFTCLIX LANCETS MISC: 12 refills | Status: DC

## 2023-12-25 MED ORDER — ACCU-CHEK AVIVA PLUS VI STRP: ORAL_STRIP | 0 refills | Status: DC

## 2023-12-27 ENCOUNTER — Other Ambulatory Visit: Payer: Self-pay

## 2023-12-27 ENCOUNTER — Other Ambulatory Visit: Payer: Self-pay | Admitting: Internal Medicine

## 2023-12-27 MED ORDER — ACCU-CHEK GUIDE W/DEVICE KIT: PACK | 2 refills | Status: AC

## 2023-12-28 ENCOUNTER — Other Ambulatory Visit: Payer: Self-pay | Admitting: Internal Medicine

## 2023-12-31 ENCOUNTER — Emergency Department

## 2023-12-31 ENCOUNTER — Encounter: Payer: Self-pay | Admitting: *Deleted

## 2023-12-31 ENCOUNTER — Ambulatory Visit: Payer: Self-pay

## 2023-12-31 ENCOUNTER — Emergency Department
Admission: EM | Admit: 2023-12-31 | Discharge: 2023-12-31 | Disposition: A | Discharge status: Home / Self Care | Attending: Emergency Medicine | Admitting: Emergency Medicine

## 2023-12-31 ENCOUNTER — Other Ambulatory Visit: Payer: Self-pay

## 2023-12-31 DIAGNOSIS — S90112A Contusion of left great toe without damage to nail, initial encounter: Secondary | ICD-10-CM | POA: Diagnosis not present

## 2023-12-31 DIAGNOSIS — Y9339 Activity, other involving climbing, rappelling and jumping off: Secondary | ICD-10-CM | POA: Insufficient documentation

## 2023-12-31 DIAGNOSIS — M19072 Primary osteoarthritis, left ankle and foot: Secondary | ICD-10-CM | POA: Diagnosis not present

## 2023-12-31 DIAGNOSIS — E119 Type 2 diabetes mellitus without complications: Secondary | ICD-10-CM | POA: Insufficient documentation

## 2023-12-31 DIAGNOSIS — I2510 Atherosclerotic heart disease of native coronary artery without angina pectoris: Secondary | ICD-10-CM | POA: Diagnosis not present

## 2023-12-31 DIAGNOSIS — Z96698 Presence of other orthopedic joint implants: Secondary | ICD-10-CM | POA: Insufficient documentation

## 2023-12-31 DIAGNOSIS — X58XXXA Exposure to other specified factors, initial encounter: Secondary | ICD-10-CM | POA: Diagnosis not present

## 2023-12-31 DIAGNOSIS — I10 Essential (primary) hypertension: Secondary | ICD-10-CM | POA: Diagnosis not present

## 2023-12-31 DIAGNOSIS — M79675 Pain in left toe(s): Secondary | ICD-10-CM | POA: Diagnosis present

## 2023-12-31 LAB — CBC
HCT: 47 % (ref 39.0–52.0)
Hemoglobin: 15 g/dL (ref 13.0–17.0)
MCH: 29.3 pg (ref 26.0–34.0)
MCHC: 31.9 g/dL (ref 30.0–36.0)
MCV: 91.8 fL (ref 80.0–100.0)
Platelets: 231 K/uL (ref 150–400)
RBC: 5.12 MIL/uL (ref 4.22–5.81)
RDW: 14.4 % (ref 11.5–15.5)
WBC: 7.1 K/uL (ref 4.0–10.5)
nRBC: 0 % (ref 0.0–0.2)

## 2023-12-31 LAB — BASIC METABOLIC PANEL WITH GFR
Anion gap: 11 (ref 5–15)
BUN: 19 mg/dL (ref 6–20)
CO2: 26 mmol/L (ref 22–32)
Calcium: 9.1 mg/dL (ref 8.9–10.3)
Chloride: 102 mmol/L (ref 98–111)
Creatinine, Ser: 1.11 mg/dL (ref 0.61–1.24)
GFR, Estimated: >60 mL/min (ref >60)
Glucose, Bld: 107 mg/dL — ABNORMAL HIGH (ref 70–99)
Potassium: 4.1 mmol/L (ref 3.5–5.1)
Sodium: 139 mmol/L (ref 135–145)

## 2023-12-31 NOTE — Telephone Encounter (Signed)
 Fyi- called patient and offered urgent care and he says he would rather go to the ED so he is going to go over today to be evaluated.SABRA

## 2023-12-31 NOTE — Telephone Encounter (Signed)
 Reviewed. Given toe is purple and given history of diabets and CAD - would recommend evaluation before 01/03/24- to confirm no acute vascular event.

## 2023-12-31 NOTE — ED Provider Notes (Signed)
 [facility] Provider Note    Event Date/Time   First MD Initiated Contact with Patient 12/31/23 1851     (approximate)   History   Toe Pain   HPI  [name] is a 58 y.o. male history of type 2 diabetes coronary disease and a remote history of DVT in November 2022 on the left, hypertension   Patient noticed 2 days ago that his left big toe at the tip had a purpleish color.  Reports it feels a little tingly.  He has not noticed any other symptoms no fevers no chills.  Does not recall injuring or bumping into anything.  Reports he had a previous surgery with joint replacement years ago and that toe.  It is not swollen is not been draining.  He is not any chills.  He is hoping he just bruised it but he is concerned it might be low blood flow because of how bruised it looks.  He does have neuropathy so it makes it hard for him to know if he might of bumped into something  Physical Exam   Triage Vital Signs: ED Triage Vitals  Encounter Vitals Group     BP 12/31/23 1545 (!) 171/91     Girls Systolic BP Percentile --      Girls Diastolic BP Percentile --      Boys Systolic BP Percentile --      Boys Diastolic BP Percentile --      Pulse Rate 12/31/23 1545 89     Resp 12/31/23 1545 18     Temp 12/31/23 1545 98 F (36.7 C)     Temp Source 12/31/23 1545 Oral     SpO2 12/31/23 1545 95 %     Weight 12/31/23 1546 239 lb (108.4 kg)     Height 12/31/23 1546 6' (1.829 m)     Head Circumference --      Peak Flow --      Pain Score 12/31/23 1546 0     Pain Loc --      Pain Education --      Exclude from Growth Chart --     Most recent vital signs: Vitals:   12/31/23 1945 12/31/23 2000  BP:  (!) 184/97  Pulse: 78 81  Resp: 15 13  Temp:    SpO2: 95% 94%     General: Awake, no distress.  CV:  Good peripheral perfusion.  Normal tones and rate.  Regular.  Strong palpable pulses in the left foot including dorsalis pedis and posterior  tibial. Resp:  Normal effort.  Abd:  No distention.  Other:  See clinical media upload.  All toes of the left foot appear normal with exception to the great toe where the distal tuft has slight purpleish hue.  There is no necrosis is no abscess or indurated area.  There is capillary refill about 1 second similar to his other toes.  He denies pain he reports he just feels a little tingly or slightly numb but he reports is also very hard for him to discern because of his neuropathy  A compared to his other foot sensation appears similar bilateral.  There is no splinter hemorrhages in the nailbeds.  There is no other areas that appear similar in his other toes or right foot.  ED Results / Procedures / Treatments   Labs (all labs ordered are listed, but only abnormal results are displayed) Labs Reviewed  BASIC METABOLIC PANEL WITH GFR -  Abnormal; Notable for the following components:      Result Value   Glucose, Bld 107 (*)    All other components within normal limits  CBC     EKG  EKG performed to evaluate and exclude A-fib in the unlikely event that this represents a thrombotic type abnormality  Interpreted by me at 1935 heart rate 80 QRS 120 QT 430 Sinus rhythm no evidence of acute ischemia.  Incomplete right bundle branch block pattern.  ECG performed to evaluate for possible A-fib or prothrombotic cause, no evidence of A-fib noted at this time   RADIOLOGY  DG Toe Great Left Result Date: 12/31/2023 CLINICAL DATA:  Left great toe numbness and discoloration EXAM: LEFT GREAT TOE COMPARISON:  01/12/2020 FINDINGS: Frontal, oblique, lateral views of the left great toe are obtained. Postsurgical changes are seen from arthroplasty at the first MTP joint, with no evidence of complication. There are no acute displaced fractures. 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 Soft tissues appear unremarkable. IMPRESSION: 1. Unremarkable first MTP joint arthroplasty. 2. 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 3. No acute fracture. Electronically Signed   By: Ozell Daring M.D.   On: 12/31/2023 20:22      PROCEDURES:  Critical Care performed: No  Procedures   MEDICATIONS ORDERED IN ED: Medications - No data to display   IMPRESSION / MDM / ASSESSMENT AND PLAN / ED COURSE  I reviewed the triage vital signs and the nursing notes.                              Differential diagnosis includes, but is not limited to, possible contusion seems most likely, not warm to touch with regard to concern for cellulitic, it is not erythematous, there is no necrosis, and he has strong pulses with good blood flow and capillary refill suggesting more of a contusion like picture.  Difficult to exclude very distal ischemia in the left foot great toe but I suspect this is more likely a contusion.  No obvious evidence of infection.  Patient's presentation is most consistent with acute complicated illness / injury requiring diagnostic workup.   He does not have any splinter hemorrhages or findings that suggest embolic type phenomenon.  His ECG does not demonstrate A-fib   Clinical Course as of 12/31/23 2048  Mon Dec 31, 2023  1909 Strong palpable pulses in the left foot including dorsalis pedis and posterior tibial.  The entire left foot is warm and well-perfused.  Capillary refill is normal including in the left foot great toe.  It is warm but not hot to touch.  There is no erythema.  See clinical media upload [MQ]    Clinical Course User Index [MQ] Dicky Anes, MD   ----------------------------------------- 8:48 PM on 12/31/2023 ----------------------------------------- Discussed case with Dr. Malvin.  Reviewed clinical exam and he also reviewed the picture of the patient's foot.  Advised thinks most likely this is a contusion or bruise.  I am in agreement.  Discussed with the patient, areas outlined.  He will continue to monitor look for concerning  signs or symptoms which have been discussed in recommended he follow-up with either his primary care or with podiatry for reassessment in the next couple of days.  Return precautions and treatment recommendations and follow-up discussed with the patient who is agreeable with the plan.   FINAL CLINICAL IMPRESSION(S) / ED DIAGNOSES   Final diagnoses:  Contusion of left  great toe without damage to nail, initial encounter     Rx / DC Orders   ED Discharge Orders     None        Note:  This document was prepared using Dragon voice recognition software and may include unintentional dictation errors.   Dicky Anes, MD 12/31/23 2101

## 2023-12-31 NOTE — Telephone Encounter (Signed)
 FYI Only or Action Required?: FYI only for provider.  Patient was last seen in primary care on 11/09/2023 by Vincente Saber, NP.  Called Nurse Triage reporting Big Toe Numbness.  Symptoms began several days ago.  Interventions attempted: Nothing.  Symptoms are: stable.  Triage Disposition: See PCP When Office is Open (Within 3 Days)  Patient/caregiver understands and will follow disposition?: Yes Reason for Disposition  [1] Numbness or tingling in both feet AND [2] new or increased    L big toe is purple  Answer Assessment - Initial Assessment Questions 3-4 days ago at beach this weekend, noticed in shower L big toe was purple.  1. SYMPTOM: What's the main symptom you're concerned about? (e.g., rash, sore, callus, drainage, numbness)     L big toe is purple, stated that's unusual  2. LOCATION: Where is the  Numbness located? (e.g., foot/toe, top/bottom, left/right)     L big toe  3. APPEARANCE: What does the area look like? (e.g., normal, red, swollen; size)     Purple 4. ONSET: When did the  Numbness  start?     Neuropathy, always has numbness.   5. PAIN: Is there any pain? If Yes, ask: How bad is it? (Scale: 0-10; none, mild, moderate, severe)     No  6. CAUSE: What do you think is causing the symptoms?     Unsure  7. USUAL RANGE: What is your blood glucose level usually? (e.g., usual fasting morning value, usual evening value)     Averaging 114-128  8. OTHER SYMPTOMS: Do you have any other symptoms? (e.g., fever, weakness)     No  Protocols used: Diabetes - Foot Problems and Questions-A-AH  Copied from CRM #[id number]. Topic: Clinical - Red Word Triage >> Dec 31, 2023  2:29 PM Viola F wrote: Patient left big toe is numb - requesting sooner appt

## 2023-12-31 NOTE — Discharge Instructions (Signed)
 Follow-up please schedule a follow-up visit in the next 1 to 2 days with either your primary care provider or podiatrist to have your toe reassessed.  If you develop any fever, the toe turns black, get swollen, or color changes begin to extend further into the foot or other concerns arise please return to the ER right away.

## 2023-12-31 NOTE — ED Triage Notes (Addendum)
 Pt ambulatory to triage.  Pt has left great toe numbness and toe is purple for 3 days.  No known injury.  Hx diabetes.  Pt alert.  Speech clear

## 2024-01-03 ENCOUNTER — Ambulatory Visit: Admitting: Internal Medicine

## 2024-01-04 ENCOUNTER — Other Ambulatory Visit: Payer: Self-pay | Admitting: Surgical

## 2024-01-05 ENCOUNTER — Other Ambulatory Visit: Payer: Self-pay | Admitting: Internal Medicine

## 2024-01-09 ENCOUNTER — Ambulatory Visit: Admitting: Internal Medicine

## 2024-01-09 ENCOUNTER — Encounter: Payer: Self-pay | Admitting: Internal Medicine

## 2024-01-09 VITALS — BP 150/80 | HR 72 | Temp 98.4°F | Ht 72.0 in | Wt 242.4 lb

## 2024-01-09 DIAGNOSIS — E119 Type 2 diabetes mellitus without complications: Secondary | ICD-10-CM

## 2024-01-09 DIAGNOSIS — G4733 Obstructive sleep apnea (adult) (pediatric): Secondary | ICD-10-CM | POA: Diagnosis not present

## 2024-01-09 DIAGNOSIS — I10 Essential (primary) hypertension: Secondary | ICD-10-CM | POA: Diagnosis not present

## 2024-01-09 DIAGNOSIS — Z87891 Personal history of nicotine dependence: Secondary | ICD-10-CM

## 2024-01-09 DIAGNOSIS — Z720 Tobacco use: Secondary | ICD-10-CM

## 2024-01-09 NOTE — Patient Instructions (Addendum)
 Excellent Job A+ GOLD STAR!!  Continue CPAP as prescribed  Patient Instructions Continue to use CPAP every night, minimum of 4-6 hours a night.  Change equipment every 30 days or as directed by DME.  Wash your tubing with warm soap and water  daily, hang to dry. Wash humidifier portion weekly. Use bottled, distilled water  and change daily   Be aware of reduced alertness and do not drive or operate heavy machinery if experiencing this or drowsiness.  Exercise encouraged, as tolerated. Encouraged proper weight management.  Important to get eight or more hours of sleep  Limiting the use of the computer and television before bedtime.  Decrease naps during the day, so night time sleep will become enhanced.  Limit caffeine, and sleep deprivation.    Avoid Allergens and Irritants Avoid secondhand smoke Avoid SICK contacts Recommend  Masking  when appropriate Recommend Keep up-to-date with vaccinations  Recommend lung cancer screening program assessment referral placed

## 2024-01-09 NOTE — Progress Notes (Unsigned)
 [facility] Consultation      CC Follow-up assessment for OSA  Summary:  [name] is a 58 y.o. male former smoker with obstructive sleep apnea.  Subjective:  Follow-up assessment for sleep apnea Previously seen by Dr. Shellia and in Dr. Verdia   Discussed sleep data and reviewed with patient.  Encouraged proper weight management.  Discussed driving precautions and its relationship with hypersomnolence.  Discussed sleep hygiene, and benefits of a fixed sleep waked time.  The importance of getting eight or more hours of sleep discussed with patient.  Discussed limiting the use of the computer and television before bedtime.  Decrease naps during the day, so night time sleep will become enhanced.  Limit caffeine, and sleep deprivation.   Patient uses and benefits from therapy Using CPAP nightly and with naps Pressure setting is comfortable and is sleeping well. previous AHI 18 Excellent compliance report Average usage  6 hours AHI well-controlled 1.4  No exacerbation at this time No evidence of heart failure at this time No evidence or signs of infection at this time No respiratory distress No fevers, chills, nausea, vomiting, diarrhea No evidence of lower extremity edema No evidence hemoptysis  Patient is a former smoker 2 packs a day for 15 years      Physical Exam:   BP (!) 150/80   Pulse 72   Temp 98.4 F (36.9 C)   Ht 6' (1.829 m)   Wt 242 lb 6.4 oz (110 kg)   SpO2 95%   BMI 32.88 kg/m      Review of Systems: Gen:  Denies  fever, sweats, chills weight loss  HEENT: Denies blurred vision, double vision, ear pain, eye pain, hearing loss, nose bleeds, sore throat Cardiac:  No dizziness, chest pain or heaviness, chest tightness,edema, No JVD Resp:   No cough, -sputum production, -shortness of breath,-wheezing, -hemoptysis,  Other:  All other systems negative   Physical Examination:   General Appearance: No distress   EYES PERRLA, EOM intact.   NECK Supple, No JVD Pulmonary: normal breath sounds, No wheezing.  CardiovascularNormal S1,S2.  No m/r/g.   Abdomen: Benign, Soft, non-tender. Neurology UE/LE 5/5 strength, no focal deficits Ext pulses intact, cap refill intact ALL OTHER ROS ARE NEGATIVE    Assessment/Plan:  58 year old pleasant white male with morbid obesity and underlying moderate sleep apnea   Assessment of OSA Previous AHI 18 Continue CPAP as prescribed  Excellent compliance report Reviewed compliance report in detail with patient Patient definitely benefits the use of CPAP therapy as prescribed Using CPAP nightly and with naps Pressure setting is comfortable and is sleeping well. CPAP prescription 4-12 AHI reduced to 1.4  No evidence of acute heart failure at this time No respiratory distress No fevers, chills, nausea, vomiting, diarrhea No evidence hemoptysis  Patient Instructions Continue to use CPAP every night, minimum of 4-6 hours a night.  Change equipment every 30 days or as directed by DME.  Wash your tubing with warm soap and water  daily, hang to dry. Wash humidifier portion weekly. Use bottled, distilled water  and change daily   Be aware of reduced alertness and do not drive or operate heavy machinery if experiencing this or drowsiness.  Exercise encouraged, as tolerated. Encouraged proper weight management.  Important to get eight or more hours of sleep  Limiting the use of the computer and television before bedtime.  Decrease naps during the day, so night time sleep will become enhanced.  Limit caffeine, and sleep deprivation.  HTN, stroke, uncontrolled diabetes and heart failure are potential risk factors.  Risk of untreated sleep apnea including cardiac arrhthymias, stroke, DM, pulm HTN.   Hypertension - Sleep apnea can contribute to hypertension, therefore treatment of sleep apnea is important part of hypertension management.  Diabetes Mellitis - Sleep  apnea can contribute to DM, therefore treatment of sleep apnea is important part of DM management.  Former smoker 2 packs a day for 15 years However quit 17 years ago Patient is aged 58 may be a candidate for lung cancer screening program  MEDICATION ADJUSTMENTS/LABS AND TESTS ORDERED: Continue CPAP as prescribed Avoid Allergens and Irritants Avoid secondhand smoke Avoid SICK contacts Recommend  Masking  when appropriate Recommend Keep up-to-date with vaccinations Lung cancer screening assessment program  CURRENT MEDICATIONS REVIEWED AT LENGTH WITH PATIENT TODAY   Patient  satisfied with Plan of action and management. All questions answered   Follow up 1 year   I spent a total of 42 minutes dedicated to the care of this patient on the date of this encounter to include pre-visit review of records, face-to-face time with the patient discussing conditions above, post visit ordering of testing, clinical documentation with the electronic health record, making appropriate referrals as documented, and communicating necessary information to the patient's healthcare team.    The Patient requires high complexity decision making for assessment and support, frequent evaluation and titration of therapies, application of advanced monitoring technologies and extensive interpretation of multiple databases.  Patient satisfied with Plan of action and management. All questions answered    Nickolas Alm Cellar, M.D.  [facility]  Medical Director [facility] Medical Director [facility] Cardio-Pulmonary Department     Flow Sheet    Sleep tests:  PSG 01/21/07 >> AHI 18, SpO2 low 56%. CPAP 7 cm H2O>>AHI 2.6  Medications:   Allergies as of 01/09/2024       Reactions   Ambien  [zolpidem ] Swelling, Other (See Comments)   Swelling in the throat        Medication List        Accurate as of January 09, 2024  1:56 PM. If you have any questions, ask your  nurse or doctor.          Accu-Chek Aviva Plus test strip Generic drug: glucose blood USE TO CHECK BLOOD SUGAR UP TO 4 TIMES DAILY.   Accu-Chek Guide w/Device Kit Use to check sugars twice daily   accu-chek multiclix lancets Use to to check blood sugars up to 4 times daily. ICD-10: E11.40   Accu-Chek Softclix Lancets lancets Use as instructed   albuterol  108 (90 Base) MCG/ACT inhaler Commonly known as: VENTOLIN  HFA INHALE 2 PUFFS BY MOUTH EVERY 6 HOURS AS NEEDED FOR WHEEZING OR SHORTNESS OF BREATH   ARIPiprazole  5 MG tablet Commonly known as: Abilify  Take one tab at bed time   aspirin  EC 81 MG tablet Take 1 tablet (81 mg total) by mouth daily at 6 PM. Swallow whole.   buPROPion  300 MG 24 hr tablet Commonly known as: Wellbutrin  XL Take 1 tablet (300 mg total) by mouth daily.   busPIRone  15 MG tablet Commonly known as: BUSPAR  Take 1 tablet (15 mg total) by mouth 3 (three) times daily.   carvedilol  25 MG tablet Commonly known as: COREG  Take 1 tablet by mouth twice daily   celecoxib  100 MG capsule Commonly known as: CELEBREX  Take 1 capsule by mouth twice daily   clonazePAM  0.5 MG tablet Commonly known as: KLONOPIN   1 tablets in the morning,  2 tablets in the evening   dicyclomine  20 MG tablet Commonly known as: BENTYL  TAKE 1 TABLET BY MOUTH EVERY 6 HOURS   ezetimibe  10 MG tablet Commonly known as: ZETIA  Take 1 tablet (10 mg total) by mouth daily.   folic acid  1 MG tablet Commonly known as: FOLVITE  Take 1 tablet (1 mg total) by mouth daily.   furosemide  40 MG tablet Commonly known as: LASIX  Take 1 tablet (40 mg total) by mouth daily.   gabapentin  300 MG capsule Commonly known as: NEURONTIN  Take 3 capsules (900 mg total) by mouth 3 (three) times daily.   glipiZIDE  2.5 MG Tabs Take 1 tablet by mouth 2 (two) times daily before a meal.   hydrALAZINE  25 MG tablet Commonly known as: APRESOLINE  Take 1 tablet (25 mg total) by mouth 3 (three) times daily.    HYDROcodone -acetaminophen  10-325 MG tablet Commonly known as: NORCO Take 1 tablet by mouth daily as needed.   isosorbide  mononitrate 30 MG 24 hr tablet Commonly known as: IMDUR  Take 1 tablet (30 mg total) by mouth daily.   Jardiance  10 MG Tabs tablet Generic drug: empagliflozin  TAKE ONE TABLET BY MOUTH DAILY   levothyroxine  88 MCG tablet Commonly known as: SYNTHROID  Take 1 tablet (88 mcg total) by mouth daily before breakfast.   losartan  100 MG tablet Commonly known as: COZAAR  Take 1 tablet (100 mg total) by mouth daily.   metFORMIN  500 MG tablet Commonly known as: GLUCOPHAGE  Take 1 tablet (500 mg total) by mouth 2 (two) times daily with a meal.   methocarbamol  500 MG tablet Commonly known as: ROBAXIN  TAKE 1 TABLET BY MOUTH EVERY 8 HOURS AS NEEDED   multivitamin with minerals Tabs tablet Take 1 tablet by mouth daily.   naloxone  4 MG/0.1ML Liqd nasal spray kit Commonly known as: NARCAN  Use as needed for excessive sedation /opioid overdose   omeprazole  40 MG capsule Commonly known as: PRILOSEC TAKE 1 CAPSULE BY MOUTH IN THE MORNING AND 1 CAPSULE IN THE EVENING   promethazine  12.5 MG tablet Commonly known as: PHENERGAN  Take 1 tablet (12.5 mg total) by mouth every 4 (four) hours as needed for nausea or vomiting.   rosuvastatin  40 MG tablet Commonly known as: CRESTOR  Take 1 tablet (40 mg total) by mouth daily.   traZODone  100 MG tablet Commonly known as: DESYREL  TAKE 1 & 1/2 (ONE & ONE-HALF) TABLETS BY MOUTH AT BEDTIME

## 2024-01-16 ENCOUNTER — Encounter: Payer: Self-pay | Admitting: Internal Medicine

## 2024-01-16 ENCOUNTER — Ambulatory Visit (INDEPENDENT_AMBULATORY_CARE_PROVIDER_SITE_OTHER): Admitting: Internal Medicine

## 2024-01-16 VITALS — BP 122/68 | HR 74 | Ht 72.0 in | Wt 236.8 lb

## 2024-01-16 DIAGNOSIS — E1169 Type 2 diabetes mellitus with other specified complication: Secondary | ICD-10-CM | POA: Diagnosis not present

## 2024-01-16 DIAGNOSIS — E785 Hyperlipidemia, unspecified: Secondary | ICD-10-CM | POA: Diagnosis not present

## 2024-01-16 DIAGNOSIS — Z7984 Long term (current) use of oral hypoglycemic drugs: Secondary | ICD-10-CM | POA: Diagnosis not present

## 2024-01-16 DIAGNOSIS — G894 Chronic pain syndrome: Secondary | ICD-10-CM | POA: Diagnosis not present

## 2024-01-16 DIAGNOSIS — F411 Generalized anxiety disorder: Secondary | ICD-10-CM

## 2024-01-16 DIAGNOSIS — Z23 Encounter for immunization: Secondary | ICD-10-CM

## 2024-01-16 DIAGNOSIS — E119 Type 2 diabetes mellitus without complications: Secondary | ICD-10-CM | POA: Diagnosis not present

## 2024-01-16 DIAGNOSIS — R609 Edema, unspecified: Secondary | ICD-10-CM | POA: Diagnosis not present

## 2024-01-16 MED ORDER — HYDROCODONE-ACETAMINOPHEN 10-325 MG PO TABS: 1.0000 | ORAL_TABLET | Freq: Every day | ORAL | 0 refills | Status: DC | PRN

## 2024-01-16 MED ORDER — GABAPENTIN 300 MG PO CAPS: 900.0000 mg | ORAL_CAPSULE | Freq: Three times a day (TID) | ORAL | 0 refills | Status: DC

## 2024-01-16 MED ORDER — ROSUVASTATIN CALCIUM 40 MG PO TABS: 40.0000 mg | ORAL_TABLET | Freq: Every day | ORAL | 1 refills | Status: AC

## 2024-01-16 MED ORDER — LEVOTHYROXINE SODIUM 88 MCG PO TABS: 88.0000 ug | ORAL_TABLET | Freq: Every day | ORAL | 1 refills | Status: AC

## 2024-01-16 MED ORDER — HYDRALAZINE HCL 50 MG PO TABS: 50.0000 mg | ORAL_TABLET | Freq: Three times a day (TID) | ORAL | 1 refills | Status: DC

## 2024-01-16 MED ORDER — ACCU-CHEK AVIVA PLUS VI STRP: ORAL_STRIP | 3 refills | Status: DC

## 2024-01-16 MED ORDER — CLONAZEPAM 0.5 MG PO TABS: ORAL_TABLET | ORAL | 2 refills | Status: DC

## 2024-01-16 MED ORDER — GLIPIZIDE 2.5 MG PO TABS: 2.0000 | ORAL_TABLET | Freq: Every day | ORAL | 1 refills | Status: DC

## 2024-01-16 MED ORDER — LOSARTAN POTASSIUM 100 MG PO TABS: 100.0000 mg | ORAL_TABLET | Freq: Every day | ORAL | 1 refills | Status: AC

## 2024-01-16 MED ORDER — METFORMIN HCL 500 MG PO TABS: 500.0000 mg | ORAL_TABLET | Freq: Two times a day (BID) | ORAL | 1 refills | Status: AC

## 2024-01-16 MED ORDER — JARDIANCE 10 MG PO TABS: 10.0000 mg | ORAL_TABLET | Freq: Every day | ORAL | 0 refills | Status: DC

## 2024-01-16 MED ORDER — ACCU-CHEK SOFTCLIX LANCETS MISC: 12 refills | Status: AC

## 2024-01-16 NOTE — Progress Notes (Unsigned)
 Subjective:  Patient ID: [name], male    DOB: 11-Aug-1965  Age: 58 y.o. MRN: [id number]  CC: The primary encounter diagnosis was Hyperlipidemia associated with type 2 diabetes mellitus (HCC). Diagnoses of Diabetes mellitus treated with oral medication (HCC), Need for pneumococcal 20-valent conjugate vaccination, 1+ pitting edema, Chronic pain associated with significant psychosocial dysfunction, and GAD (generalized anxiety disorder) were also pertinent to this visit.   HPI CHIN WACHTER presents for  Chief Complaint  Patient presents with   Medical Management of Chronic Issues    3 month follow up    1) Chronic pain; he has chronic  low back pain complicated by diabetic neuropathy. his neuropathy is keeping him up at night despite  the use of high doses of gabapentin , and he is requesting an increase in vicodin to 2 daily to provide beter  relief at night  2) HTN:  home readings have been elevated since the  amlodipine  was stopped in August by Dr Perla.  Reviewed regiemen:  hydralazine  25 mg tid was added to carvedilol  and losartan  both at maximal doses.   3)  type 2 DM: his  BS have been elevated in the early am fasting,  he has had several lows in the mid afternoon,  caused by skipping lunch .  Takes glipizide  2.5 mg bid  4) OBESITY : HE HAS HAD AN INTENTIONAL WT LOSS OF 10 LB S SINCE jUNE by controlling portions and eating less fast food,    Outpatient Medications Prior to Visit  Medication Sig Dispense Refill   albuterol  (VENTOLIN  HFA) 108 (90 Base) MCG/ACT inhaler INHALE 2 PUFFS BY MOUTH EVERY 6 HOURS AS NEEDED FOR WHEEZING OR SHORTNESS OF BREATH 9 g 0   ARIPiprazole  (ABILIFY ) 5 MG tablet Take one tab at bed time 90 tablet 0   aspirin  EC 81 MG tablet Take 1 tablet (81 mg total) by mouth daily at 6 PM. Swallow whole. 30 tablet 12   Blood Glucose Monitoring Suppl (ACCU-CHEK GUIDE) w/Device KIT Use to check sugars twice daily 1 kit 2   buPROPion  (WELLBUTRIN  XL) 300 MG 24  hr tablet Take 1 tablet (300 mg total) by mouth daily. 90 tablet 0   busPIRone  (BUSPAR ) 15 MG tablet Take 1 tablet (15 mg total) by mouth 3 (three) times daily. 270 tablet 3   carvedilol  (COREG ) 25 MG tablet Take 1 tablet by mouth twice daily 180 tablet 1   celecoxib  (CELEBREX ) 100 MG capsule Take 1 capsule by mouth twice daily 60 capsule 0   dicyclomine  (BENTYL ) 20 MG tablet TAKE 1 TABLET BY MOUTH EVERY 6 HOURS 360 tablet 0   ezetimibe  (ZETIA ) 10 MG tablet Take 1 tablet (10 mg total) by mouth daily. 90 tablet 3   folic acid  (FOLVITE ) 1 MG tablet Take 1 tablet (1 mg total) by mouth daily. 90 tablet 0   furosemide  (LASIX ) 40 MG tablet Take 1 tablet (40 mg total) by mouth daily. 90 tablet 3   isosorbide  mononitrate (IMDUR ) 30 MG 24 hr tablet Take 1 tablet (30 mg total) by mouth daily. 90 tablet 3   Lancets (ACCU-CHEK MULTICLIX) lancets Use to to check blood sugars up to 4 times daily. ICD-10: E11.40 400 each 0   methocarbamol  (ROBAXIN ) 500 MG tablet TAKE 1 TABLET BY MOUTH EVERY 8 HOURS AS NEEDED 90 tablet 0   Multiple Vitamin (MULTIVITAMIN WITH MINERALS) TABS tablet Take 1 tablet by mouth daily.     naloxone  (NARCAN ) nasal spray 4 mg/0.1  mL Use as needed for excessive sedation /opioid overdose 1 each 1   omeprazole  (PRILOSEC) 40 MG capsule TAKE 1 CAPSULE BY MOUTH IN THE MORNING AND 1 CAPSULE IN THE EVENING 180 capsule 3   promethazine  (PHENERGAN ) 12.5 MG tablet Take 1 tablet (12.5 mg total) by mouth every 4 (four) hours as needed for nausea or vomiting. 20 tablet 0   traZODone  (DESYREL ) 100 MG tablet TAKE 1 & 1/2 (ONE & ONE-HALF) TABLETS BY MOUTH AT BEDTIME 135 tablet 3   Accu-Chek Softclix Lancets lancets Use as instructed 100 each 12   clonazePAM  (KLONOPIN ) 0.5 MG tablet 1 tablets in the morning,  2 tablets in the evening 90 tablet 2   gabapentin  (NEURONTIN ) 300 MG capsule Take 3 capsules (900 mg total) by mouth 3 (three) times daily. 810 capsule 0   glipiZIDE  2.5 MG TABS Take 1 tablet by mouth 2  (two) times daily before a meal. 180 tablet 1   glucose blood (ACCU-CHEK AVIVA PLUS) test strip USE TO CHECK BLOOD SUGAR UP TO 4 TIMES DAILY. 400 each 0   hydrALAZINE  (APRESOLINE ) 25 MG tablet Take 1 tablet (25 mg total) by mouth 3 (three) times daily. 270 tablet 1   HYDROcodone -acetaminophen  (NORCO) 10-325 MG tablet Take 1 tablet by mouth daily as needed. 30 tablet 0   JARDIANCE  10 MG TABS tablet TAKE ONE TABLET BY MOUTH DAILY 90 tablet 0   levothyroxine  (SYNTHROID ) 88 MCG tablet Take 1 tablet (88 mcg total) by mouth daily before breakfast. 90 tablet 1   losartan  (COZAAR ) 100 MG tablet Take 1 tablet (100 mg total) by mouth daily. 90 tablet 1   metFORMIN  (GLUCOPHAGE ) 500 MG tablet Take 1 tablet (500 mg total) by mouth 2 (two) times daily with a meal. 180 tablet 1   rosuvastatin  (CRESTOR ) 40 MG tablet Take 1 tablet (40 mg total) by mouth daily. 90 tablet 1   No facility-administered medications prior to visit.    Review of Systems;  Patient denies headache, fevers, malaise, unintentional weight loss, skin rash, eye pain, sinus congestion and sinus pain, sore throat, dysphagia,  hemoptysis , cough, dyspnea, wheezing, chest pain, palpitations, orthopnea, edema, abdominal pain, nausea, melena, diarrhea, constipation, flank pain, dysuria, hematuria, urinary  Frequency, nocturia, numbness, tingling, seizures,  Focal weakness, Loss of consciousness,  Tremor, insomnia, depression, anxiety, and suicidal ideation.      Objective:  BP 122/68   Pulse 74   Ht 6' (1.829 m)   Wt 236 lb 12.8 oz (107.4 kg)   SpO2 94%   BMI 32.12 kg/m   BP Readings from Last 3 Encounters:  01/16/24 122/68  01/09/24 (!) 150/80  12/31/23 (!) 173/97    Wt Readings from Last 3 Encounters:  01/16/24 236 lb 12.8 oz (107.4 kg)  01/09/24 242 lb 6.4 oz (110 kg)  12/31/23 239 lb (108.4 kg)    Physical Exam Vitals reviewed.  Constitutional:      General: He is not in acute distress.    Appearance: Normal appearance.  He is normal weight. He is not ill-appearing, toxic-appearing or diaphoretic.  HENT:     Head: Normocephalic.  Eyes:     General: No scleral icterus.       Right eye: No discharge.        Left eye: No discharge.     Conjunctiva/sclera: Conjunctivae normal.  Cardiovascular:     Rate and Rhythm: Normal rate and regular rhythm.     Heart sounds: Normal heart sounds.  Pulmonary:  Effort: Pulmonary effort is normal. No respiratory distress.     Breath sounds: Normal breath sounds.  Musculoskeletal:        General: Normal range of motion.     Cervical back: Normal range of motion.  Skin:    General: Skin is warm and dry.  Neurological:     General: No focal deficit present.     Mental Status: He is alert and oriented to person, place, and time. Mental status is at baseline.  Psychiatric:        Mood and Affect: Mood normal.        Behavior: Behavior normal.        Thought Content: Thought content normal.        Judgment: Judgment normal.     Lab Results  Component Value Date   HGBA1C 6.4 (H) 10/12/2023   HGBA1C 6.9 (H) 04/11/2023   HGBA1C 7.6 (H) 11/30/2022    Lab Results  Component Value Date   CREATININE 1.11 12/31/2023   CREATININE 1.23 10/28/2023   CREATININE 1.07 10/12/2023    Lab Results  Component Value Date   WBC 7.1 12/31/2023   HGB 15.0 12/31/2023   HCT 47.0 12/31/2023   PLT 231 12/31/2023   GLUCOSE 107 (H) 12/31/2023   CHOL 80 (L) 10/12/2023   TRIG 124 10/12/2023   HDL 28 (L) 10/12/2023   LDLDIRECT 31 10/12/2023   LDLCALC 30 10/12/2023   ALT 24 10/12/2023   AST 23 10/12/2023   NA 139 12/31/2023   K 4.1 12/31/2023   CL 102 12/31/2023   CREATININE 1.11 12/31/2023   BUN 19 12/31/2023   CO2 26 12/31/2023   TSH 1.960 10/12/2023   PSA 0.70 10/16/2017   INR 0.98 08/27/2015   HGBA1C 6.4 (H) 10/12/2023   MICROALBUR 47.1 (H) 01/09/2019    DG Toe Great Left Result Date: 12/31/2023 CLINICAL DATA:  Left great toe numbness and discoloration EXAM: LEFT  GREAT TOE COMPARISON:  01/12/2020 FINDINGS: Frontal, oblique, lateral views of the left great toe are obtained. Postsurgical changes are seen from arthroplasty at the first MTP joint, with no evidence of complication. There are no acute displaced fractures. 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 Soft tissues appear unremarkable. IMPRESSION: 1. Unremarkable first MTP joint arthroplasty. 2. 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 3. No acute fracture. Electronically Signed   By: Ozell Daring M.D.   On: 12/31/2023 20:22    Assessment & Plan:  .Hyperlipidemia associated with type 2 diabetes mellitus (HCC) -     Lipid panel; Future -     LDL cholesterol, direct; Future  Diabetes mellitus treated with oral medication (HCC) Assessment & Plan: Improved control with Jardiance    metformin , and glipizide .  Today I am changing glipizide  to evening only,  5 mg.  To managed fasting elevatsions and avoid recurrent afternoon hypoglycemia. . Taking  gabapentin  at the maximal dose (900 qam and q pm  600 mg qhs) A1c and lipids are at goal.    Lab Results  Component Value Date   HGBA1C 6.4 (H) 10/12/2023   Lab Results  Component Value Date   CHOL 80 (L) 10/12/2023   HDL 28 (L) 10/12/2023   LDLCALC 30 10/12/2023   LDLDIRECT 31 10/12/2023   TRIG 124 10/12/2023   CHOLHDL 2.9 10/12/2023      Orders: -     Hemoglobin A1c -     Comprehensive metabolic panel with GFR  Need for pneumococcal 20-valent conjugate vaccination -  Pneumococcal conjugate vaccine 20-valent  1+ pitting edema Assessment & Plan: Secondary to multiple medications (gabapentin , Imdur , and  amlodipine , now on hydralazine  instead of amlodipine ).  managed with  furosemide  to 20 mg daily and Jardiance  . Last ECHO noted normal LVEF    Chronic pain associated with significant psychosocial dysfunction Assessment & Plan: He has pain involving cervical spine , lumbar spine,  shoulders and knees.  unfortunately,  his diabetic neuropathy has worsened despite maximally tolerated dose of gabapentin .  He is requesting an increase to  2 hydrocodone  daily.  I have agreed to the increase but have advised him that his concurrent use of clonazepam  will need to be decreased and have reduced the quantity of clonazepam      GAD (generalized anxiety disorder) Assessment & Plan: Continue buspirone  .  We are continuing  a slow wean from clonazepam  .  reducing his evening dose  by 0.25 mg today    Other orders -     Gabapentin ; Take 3 capsules (900 mg total) by mouth 3 (three) times daily.  Dispense: 810 capsule; Refill: 0 -     hydrALAZINE  HCl; Take 1 tablet (50 mg total) by mouth 3 (three) times daily.  Dispense: 90 tablet; Refill: 1 -     Jardiance ; Take 1 tablet (10 mg total) by mouth daily.  Dispense: 90 tablet; Refill: 0 -     Levothyroxine  Sodium; Take 1 tablet (88 mcg total) by mouth daily before breakfast.  Dispense: 90 tablet; Refill: 1 -     Losartan  Potassium; Take 1 tablet (100 mg total) by mouth daily.  Dispense: 90 tablet; Refill: 1 -     metFORMIN  HCl; Take 1 tablet (500 mg total) by mouth 2 (two) times daily with a meal.  Dispense: 180 tablet; Refill: 1 -     Rosuvastatin  Calcium ; Take 1 tablet (40 mg total) by mouth daily.  Dispense: 90 tablet; Refill: 1 -     glipiZIDE ; Take 2 tablets by mouth daily before supper.  Dispense: 180 tablet; Refill: 1 -     clonazePAM ; 1 tablets in the morning,  1.5 tablets in the evening  Dispense: 75 tablet; Refill: 2 -     HYDROcodone -Acetaminophen ; Take 1-2 tablets by mouth daily as needed.  Dispense: 60 tablet; Refill: 0 -     Accu-Chek Softclix Lancets; Use as instructed  Dispense: 100 each; Refill: 12 -     Accu-Chek Aviva Plus; USE TO CHECK BLOOD SUGAR UP TO 4 TIMES DAILY.  Dispense: 400 each; Refill: 3     I spent 34 minutes on the day of this face to face encounter reviewing patient's  most recent visit with Dr Perla his cardiologist  ,  reviewing blood sugars,  reviewing  recent  labs and imaging studies, counseling on diabetes and weight management,  reviewing the assessment and plan with patient, and post visit ordering and reviewing of  diagnostics and therapeutics with patient  .   Follow-up: Return in about 3 months (around 04/16/2024).   Verneita LITTIE Kettering, MD

## 2024-01-16 NOTE — Patient Instructions (Addendum)
 Change glipizide  to 5 mg with dinner, none in the morning (to prevent afternoon low blood sugars)   Your blood pressure medications are: losartan , isosorbide , carvedilol , and hydralazine    Increase the hydralazine  to 50 mg three times daily (I sent a new rx to [facility])  I will increase the hydrocodone  to two tablets daily (morning and bedtime),  BUT YOU WILL NEED TO REDUCE THE EVENING DOSE OF CLONAZEPAM  TO 1.5 TABLETS

## 2024-01-17 NOTE — Assessment & Plan Note (Signed)
 Improved control with Jardiance    metformin , and glipizide .  Today I am changing glipizide  to evening only,  5 mg.  To managed fasting elevatsions and avoid recurrent afternoon hypoglycemia. . Taking  gabapentin  at the maximal dose (900 qam and q pm  600 mg qhs) A1c and lipids are at goal.    Lab Results  Component Value Date   HGBA1C 6.4 (H) 10/12/2023   Lab Results  Component Value Date   CHOL 80 (L) 10/12/2023   HDL 28 (L) 10/12/2023   LDLCALC 30 10/12/2023   LDLDIRECT 31 10/12/2023   TRIG 124 10/12/2023   CHOLHDL 2.9 10/12/2023

## 2024-01-17 NOTE — Assessment & Plan Note (Signed)
 He has pain involving cervical spine , lumbar spine,  shoulders and knees. unfortunately,  his diabetic neuropathy has worsened despite maximally tolerated dose of gabapentin .  He is requesting an increase to  2 hydrocodone  daily.  I have agreed to the increase but have advised him that his concurrent use of clonazepam  will need to be decreased and have reduced the quantity of clonazepam 

## 2024-01-17 NOTE — Assessment & Plan Note (Signed)
 Continue buspirone  .  We are continuing  a slow wean from clonazepam  .  reducing his evening dose  by 0.25 mg today

## 2024-01-17 NOTE — Assessment & Plan Note (Signed)
 Secondary to multiple medications (gabapentin , Imdur , and  amlodipine , now on hydralazine  instead of amlodipine ).  managed with  furosemide  to 20 mg daily and Jardiance  . Last ECHO noted normal LVEF

## 2024-01-20 ENCOUNTER — Other Ambulatory Visit: Payer: Self-pay | Admitting: Internal Medicine

## 2024-01-20 ENCOUNTER — Encounter: Payer: Self-pay | Admitting: Internal Medicine

## 2024-01-21 MED ORDER — ACCU-CHEK AVIVA PLUS VI STRP: ORAL_STRIP | 3 refills | Status: DC

## 2024-01-22 ENCOUNTER — Encounter: Payer: Self-pay | Admitting: Internal Medicine

## 2024-01-23 NOTE — Telephone Encounter (Signed)
  Last Visit: 01/16/2024 Next Visit: 04/25/2024

## 2024-01-23 NOTE — Telephone Encounter (Signed)
 Copied from CRM #[id number]. Topic: Clinical - Prescription Issue >> Jan 23, 2024 10:35 AM Rea ORN wrote: Reason for CRM: Pt called to speak to CMA concerning his Clonazepam . Pt stated [facility] said they did not get the refill on 9/24 and he is out of the med.

## 2024-01-25 ENCOUNTER — Telehealth (HOSPITAL_BASED_OUTPATIENT_CLINIC_OR_DEPARTMENT_OTHER): Admitting: Psychiatry

## 2024-01-25 ENCOUNTER — Encounter (HOSPITAL_COMMUNITY): Payer: Self-pay | Admitting: Psychiatry

## 2024-01-25 VITALS — Wt 236.0 lb

## 2024-01-25 DIAGNOSIS — F419 Anxiety disorder, unspecified: Secondary | ICD-10-CM

## 2024-01-25 DIAGNOSIS — F331 Major depressive disorder, recurrent, moderate: Secondary | ICD-10-CM

## 2024-01-25 MED ORDER — BUPROPION HCL ER (XL) 300 MG PO TB24: 300.0000 mg | ORAL_TABLET | Freq: Every day | ORAL | 0 refills | Status: DC

## 2024-01-25 MED ORDER — ARIPIPRAZOLE 5 MG PO TABS: ORAL_TABLET | ORAL | 0 refills | Status: DC

## 2024-01-25 NOTE — Progress Notes (Signed)
 [facility] MD Virtual Progress Note   Patient Location: Outside Home Provider Location: Home Office  I connect with patient by video and verified that I am speaking with correct person by using two identifiers. I discussed the limitations of evaluation and management by telemedicine and the availability of in person appointments. I also discussed with the patient that there may be a patient responsible charge related to this service. The patient expressed understanding and agreed to proceed.  [name] [id number] 58 y.o.  01/25/2024 9:31 AM  History of Present Illness:  Patient is evaluated by video session.  He reported things are going okay.  Recently he was seen in the emergency room because of left toe pain.  Patient told he was in a lot of pain and started to have discoloration and he was afraid about having a clot.  He has a history of DVT in the past.  However extensive workup was done and found to have a old bruise.  He is doing much better.  He still have chronic pain in his knee joint but stable.  He has seen his PCP who is trying to cut down the Klonopin  slowly and gradually.  He is taking multiple medication.  He reported combination of Abilify  and Wellbutrin  helping his depression.  He had a good summer and he went twice with his friend to the beach.  He is trying to keep his blood sugar under control.  He is involved in church activities.  He had a good support from his friends.  He sleeps good.  He is trying to lose weight and lost 78 pounds since the last visit.  He is watching his calorie intake.  He denies any hopelessness, worthlessness, suicidal thoughts or any major panic attack.  He is also on BuSpar  and trazodone  prescribed by PCP.  He has no tremor or shakes or any EPS.  Past Psychiatric History: H/O depression and anxiety.  No h/o suicidal attempt or inpatient treatment.  Seen Dr. Kapoor and given Zoloft , Klonopin  and Cymbalta .  Later PCP added BuSpar   and trazodone .  We tried Lamictal  but did not help.    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   Allergy    Anxiety    Anxiety and depression    Arthritis    knees and shoulders   Benzodiazepine withdrawal with complication (HCC) 01/09/2019   Bruit    L   Chest pain    hx   Cognitive complaints with normal neuropsychological exam 10/23/2016   Diabetes mellitus without complication (HCC)    borderline, diet controlled, no meds, patient has lost 30 lbs   Edema    Fatty liver    GERD (gastroesophageal reflux disease)    uses Omeprazole    Goiter    HLD (hyperlipidemia)    Hypertension    essential, benign   Hypothyroidism    Impotence of organic origin    Murmur    never has caused any problems   Neuromuscular disorder (HCC)    neuropathy bilateral feet   Other chest pain    tightness, pressure   Palpitation    hx   Precordial pain    Sleep apnea    uses cpap    Outpatient Encounter Medications as of 01/25/2024  Medication Sig   Accu-Chek Softclix Lancets lancets Use as instructed   albuterol  (  VENTOLIN  HFA) 108 (90 Base) MCG/ACT inhaler INHALE 2 PUFFS BY MOUTH EVERY 6 HOURS AS NEEDED FOR WHEEZING OR SHORTNESS OF BREATH   ARIPiprazole  (ABILIFY ) 5 MG tablet Take one tab at bed time   aspirin  EC 81 MG tablet Take 1 tablet (81 mg total) by mouth daily at 6 PM. Swallow whole.   Blood Glucose Monitoring Suppl (ACCU-CHEK GUIDE) w/Device KIT Use to check sugars twice daily   buPROPion  (WELLBUTRIN  XL) 300 MG 24 hr tablet Take 1 tablet (300 mg total) by mouth daily.   busPIRone  (BUSPAR ) 15 MG tablet Take 1 tablet (15 mg total) by mouth 3 (three) times daily.   carvedilol  (COREG ) 25 MG tablet Take 1 tablet by mouth twice daily   celecoxib  (CELEBREX ) 100 MG capsule Take 1 capsule by mouth twice daily   clonazePAM  (KLONOPIN ) 0.5 MG  tablet TAKE 1 TABLET BY MOUTH IN THE MORNING AND 1.5 TABLETS IN THE EVENING (QUANTITY  REDUCTION)   dicyclomine  (BENTYL ) 20 MG tablet TAKE 1 TABLET BY MOUTH EVERY 6 HOURS   ezetimibe  (ZETIA ) 10 MG tablet Take 1 tablet (10 mg total) by mouth daily.   folic acid  (FOLVITE ) 1 MG tablet Take 1 tablet (1 mg total) by mouth daily.   furosemide  (LASIX ) 40 MG tablet Take 1 tablet (40 mg total) by mouth daily.   gabapentin  (NEURONTIN ) 300 MG capsule Take 3 capsules (900 mg total) by mouth 3 (three) times daily.   glipiZIDE  2.5 MG TABS Take 2 tablets by mouth daily before supper.   glucose blood (ACCU-CHEK AVIVA PLUS) test strip USE TO CHECK BLOOD SUGAR UP TO 4 TIMES DAILY.   hydrALAZINE  (APRESOLINE ) 50 MG tablet Take 1 tablet (50 mg total) by mouth 3 (three) times daily.   HYDROcodone -acetaminophen  (NORCO) 10-325 MG tablet Take 1-2 tablets by mouth daily as needed.   isosorbide  mononitrate (IMDUR ) 30 MG 24 hr tablet Take 1 tablet (30 mg total) by mouth daily.   JARDIANCE  10 MG TABS tablet Take 1 tablet (10 mg total) by mouth daily.   Lancets (ACCU-CHEK MULTICLIX) lancets Use to to check blood sugars up to 4 times daily. ICD-10: E11.40   levothyroxine  (SYNTHROID ) 88 MCG tablet Take 1 tablet (88 mcg total) by mouth daily before breakfast.   losartan  (COZAAR ) 100 MG tablet Take 1 tablet (100 mg total) by mouth daily.   metFORMIN  (GLUCOPHAGE ) 500 MG tablet Take 1 tablet (500 mg total) by mouth 2 (two) times daily with a meal.   methocarbamol  (ROBAXIN ) 500 MG tablet TAKE 1 TABLET BY MOUTH EVERY 8 HOURS AS NEEDED   Multiple Vitamin (MULTIVITAMIN WITH MINERALS) TABS tablet Take 1 tablet by mouth daily.   naloxone  (NARCAN ) nasal spray 4 mg/0.1 mL Use as needed for excessive sedation /opioid overdose   omeprazole  (PRILOSEC) 40 MG capsule TAKE 1 CAPSULE BY MOUTH IN THE MORNING AND 1 CAPSULE IN THE EVENING   promethazine  (PHENERGAN ) 12.5 MG tablet Take 1 tablet (12.5 mg total) by mouth every 4 (four) hours as needed  for nausea or vomiting.   rosuvastatin  (CRESTOR ) 40 MG tablet Take 1 tablet (40 mg total) by mouth daily.   traZODone  (DESYREL ) 100 MG tablet TAKE 1 & 1/2 (ONE & ONE-HALF) TABLETS BY MOUTH AT BEDTIME   No facility-administered encounter medications on file as of 01/25/2024.    Recent Results (from the past 2160 hours)  Basic metabolic panel     Status: Abnormal   Collection Time: 10/28/23 10:49 AM  Result Value Ref Range  Sodium 137 135 - 145 mmol/L   Potassium 4.4 3.5 - 5.1 mmol/L   Chloride 101 98 - 111 mmol/L   CO2 28 22 - 32 mmol/L   Glucose, Bld 168 (H) 70 - 99 mg/dL    Comment: Glucose reference range applies only to samples taken after fasting for at least 8 hours.   BUN 18 6 - 20 mg/dL   Creatinine, Ser 8.76 0.61 - 1.24 mg/dL   Calcium  8.8 (L) 8.9 - 10.3 mg/dL   GFR, Estimated >39 >39 mL/min    Comment: (NOTE) Calculated using the CKD-EPI Creatinine Equation (2021)    Anion gap 8 5 - 15    Comment: Performed at [facility], [address] [place], [place] [address]  CBC     Status: None   Collection Time: 10/28/23 10:49 AM  Result Value Ref Range   WBC 9.9 4.0 - 10.5 K/uL   RBC 4.60 4.22 - 5.81 MIL/uL   Hemoglobin 13.9 13.0 - 17.0 g/dL   HCT 56.8 60.9 - 47.9 %   MCV 93.7 80.0 - 100.0 fL   MCH 30.2 26.0 - 34.0 pg   MCHC 32.3 30.0 - 36.0 g/dL   RDW 85.8 88.4 - 84.4 %   Platelets 218 150 - 400 K/uL   nRBC 0.0 0.0 - 0.2 %    Comment: Performed at [facility], [address], [place], [place] [address]  Troponin I (High Sensitivity)     Status: None   Collection Time: 10/28/23 10:49 AM  Result Value Ref Range   Troponin I (High Sensitivity) 7 <18 ng/L    Comment: (NOTE) Elevated high sensitivity troponin I (hsTnI) values and significant  changes across serial measurements may suggest ACS but many other  chronic and acute conditions are known to elevate hsTnI results.  Refer to the Links section for chest pain algorithms and additional   guidance. Performed at [facility], [address] [place], [place] [address]   Urinalysis, w/ Reflex to Culture (Infection Suspected) -Urine, Clean Catch     Status: Abnormal   Collection Time: 10/28/23 12:18 PM  Result Value Ref Range   Specimen Source URINE, CLEAN CATCH    Color, Urine YELLOW (A) YELLOW   APPearance CLEAR (A) CLEAR   Specific Gravity, Urine 1.011 1.005 - 1.030   pH 5.0 5.0 - 8.0   Glucose, UA >=500 (A) NEGATIVE mg/dL   Hgb urine dipstick NEGATIVE NEGATIVE   Bilirubin Urine NEGATIVE NEGATIVE   Ketones, ur NEGATIVE NEGATIVE mg/dL   Protein, ur NEGATIVE NEGATIVE mg/dL   Nitrite NEGATIVE NEGATIVE   Leukocytes,Ua NEGATIVE NEGATIVE   RBC / HPF 0-5 0 - 5 RBC/hpf   WBC, UA 0-5 0 - 5 WBC/hpf    Comment:        Reflex urine culture not performed if WBC <=10, OR if Squamous epithelial cells >5. If Squamous epithelial cells >5 suggest recollection.    Bacteria, UA NONE SEEN NONE SEEN   Squamous Epithelial / HPF 0 0 - 5 /HPF   Mucus PRESENT    Hyaline Casts, UA PRESENT     Comment: Performed at [facility], [address] [place], [place] [address]  Troponin I (High Sensitivity)     Status: None   Collection Time: 10/28/23 12:34 PM  Result Value Ref Range   Troponin I (High Sensitivity) 6 <18 ng/L    Comment: (NOTE) Elevated high sensitivity troponin I (hsTnI) values and significant  changes across  serial measurements may suggest ACS but many other  chronic and acute conditions are known to elevate hsTnI results.  Refer to the Links section for chest pain algorithms and additional  guidance. Performed at [facility], [address] [place], [place] [address]   HM DIABETES EYE EXAM     Status: None   Collection Time: 11/29/23  9:09 AM  Result Value Ref Range   HM Diabetic Eye Exam No Retinopathy No Retinopathy    Comment: Abstracted by HIM  Basic metabolic panel     Status: Abnormal   Collection Time: 12/31/23  3:48 PM   Result Value Ref Range   Sodium 139 135 - 145 mmol/L   Potassium 4.1 3.5 - 5.1 mmol/L   Chloride 102 98 - 111 mmol/L   CO2 26 22 - 32 mmol/L   Glucose, Bld 107 (H) 70 - 99 mg/dL    Comment: Glucose reference range applies only to samples taken after fasting for at least 8 hours.   BUN 19 6 - 20 mg/dL   Creatinine, Ser 8.88 0.61 - 1.24 mg/dL   Calcium  9.1 8.9 - 10.3 mg/dL   GFR, Estimated >39 >39 mL/min    Comment: (NOTE) Calculated using the CKD-EPI Creatinine Equation (2021)    Anion gap 11 5 - 15    Comment: Performed at [facility], [address] [place], [place] [address]  CBC     Status: None   Collection Time: 12/31/23  3:48 PM  Result Value Ref Range   WBC 7.1 4.0 - 10.5 K/uL   RBC 5.12 4.22 - 5.81 MIL/uL   Hemoglobin 15.0 13.0 - 17.0 g/dL   HCT 52.9 60.9 - 47.9 %   MCV 91.8 80.0 - 100.0 fL   MCH 29.3 26.0 - 34.0 pg   MCHC 31.9 30.0 - 36.0 g/dL   RDW 85.5 88.4 - 84.4 %   Platelets 231 150 - 400 K/uL   nRBC 0.0 0.0 - 0.2 %    Comment: Performed at [facility], [address], [place], [place] [address]     Psychiatric Specialty Exam: Physical Exam  Review of Systems  Musculoskeletal:        Knee pain    Weight 236 lb (107 kg).There is no height or weight on file to calculate BMI.  General Appearance: Casual  Eye Contact:  Good  Speech:  Normal Rate  Volume:  Normal  Mood:  Euthymic  Affect:  Congruent  Thought Process:  Goal Directed  Orientation:  Full (Time, Place, and Person)  Thought Content:  Logical  Suicidal Thoughts:  No  Homicidal Thoughts:  No  Memory:  Immediate;   Good Recent;   Good Remote;   Good  Judgement:  Intact  Insight:  Present  Psychomotor Activity:  Normal  Concentration:  Concentration: Fair and Attention Span: Fair  Recall:  Good  Fund of Knowledge:  Good  Language:  Good  Akathisia:  No  Handed:  Right  AIMS (if indicated):     Assets:  Communication Skills Desire for  Improvement Housing Social Support Transportation  ADL's:  Intact  Cognition:  WNL  Sleep:  ok       01/16/2024    1:31 PM 11/09/2023    4:29 PM 11/06/2023   11:32 AM 11/06/2023   11:25 AM 10/12/2023    2:08 PM  Depression screen PHQ 2/9  Decreased Interest 1 0 0 0 1  Down, Depressed, Hopeless 2 0 1  0 1  PHQ - 2 Score 3 0 1 0 2  Altered sleeping 0 0 0 0 0  Tired, decreased energy 1 0 0 0 1  Change in appetite 0 0 0 0 1  Feeling bad or failure about yourself  0 0 0 0 0  Trouble concentrating 1 0 0 0 1  Moving slowly or fidgety/restless 0 0 0 0 0  Suicidal thoughts 0 0 0 0 0  PHQ-9 Score 5 0 1 0 5  Difficult doing work/chores Somewhat difficult Not difficult at all Not difficult at all Not difficult at all Somewhat difficult    Assessment/Plan: MDD (major depressive disorder), recurrent episode, moderate (HCC) - Plan: ARIPiprazole  (ABILIFY ) 5 MG tablet, buPROPion  (WELLBUTRIN  XL) 300 MG 24 hr tablet  Anxiety - Plan: buPROPion  (WELLBUTRIN  XL) 300 MG 24 hr tablet  Patient is 58 year old man with history of hypertension, DVT, diabetes, chronic pain, major depressive disorder and anxiety.  Reviewed blood work results, current medication.  His PCP trying to cut down the Klonopin .  He is also on trazodone  and BuSpar  prescribed by PCP.  Reviewed notes from emergency room, other collateral information including blood work results and current medication.  Discussed polypharmacy and will consider reducing the dose of the medication in the future if needed.  He has multiple health issues.  Patient reported symptoms are stable and like to keep the Wellbutrin  XL 300 mg daily and Abilify  5 mg daily.  Encouraged to call back if he has any question or any concern.  Follow-up in 3 months.   Follow Up Instructions:     I discussed the assessment and treatment plan with the patient. The patient was provided an opportunity to ask questions and all were answered. The patient agreed with the plan and  demonstrated an understanding of the instructions.   The patient was advised to call back or seek an in-person evaluation if the symptoms worsen or if the condition fails to improve as anticipated.    Collaboration of Care: Other provider involved in patient's care AEB notes are available in epic to review  Patient/Guardian was advised Release of Information must be obtained prior to any record release in order to collaborate their care with an outside provider. Patient/Guardian was advised if they have not already done so to contact the registration department to sign all necessary forms in order for us  to release information regarding their care.   Consent: Patient/Guardian gives verbal consent for treatment and assignment of benefits for services provided during this visit. Patient/Guardian expressed understanding and agreed to proceed.     Total encounter time 25 minutes which includes face-to-face time, chart reviewed, care coordination, order entry and documentation during this encounter.   Note: This document was prepared by [employer] voice dictation technology and any errors that results from this process are unintentional.    Leni ONEIDA Client, MD 01/25/2024

## 2024-01-28 ENCOUNTER — Telehealth (HOSPITAL_COMMUNITY): Admitting: Psychiatry

## 2024-01-31 ENCOUNTER — Other Ambulatory Visit: Payer: Self-pay | Admitting: Internal Medicine

## 2024-02-02 ENCOUNTER — Other Ambulatory Visit: Payer: Self-pay | Admitting: Surgical

## 2024-02-11 ENCOUNTER — Other Ambulatory Visit: Payer: Self-pay | Admitting: Internal Medicine

## 2024-02-19 ENCOUNTER — Telehealth: Payer: Self-pay | Admitting: Internal Medicine

## 2024-02-19 NOTE — Telephone Encounter (Signed)
 Pt dropped off [employer] forms to be completed and returned to him. They're in Dr Lula color folder up front

## 2024-02-20 ENCOUNTER — Other Ambulatory Visit: Payer: Self-pay | Admitting: Internal Medicine

## 2024-02-20 NOTE — Telephone Encounter (Signed)
Completed and placed in quick sign folder for signature.

## 2024-02-22 NOTE — Telephone Encounter (Signed)
 Pt is aware paperwork is complete and ready for pick up. Pt stated that he will pick up on Monday. Paperwork has been placed up front in the accordion folder.

## 2024-02-25 ENCOUNTER — Encounter: Payer: Self-pay | Admitting: Radiology

## 2024-03-03 ENCOUNTER — Other Ambulatory Visit: Payer: Self-pay | Admitting: Surgical

## 2024-03-04 ENCOUNTER — Ambulatory Visit: Payer: Medicare HMO | Admitting: *Deleted

## 2024-03-04 ENCOUNTER — Telehealth: Payer: Self-pay | Admitting: *Deleted

## 2024-03-04 ENCOUNTER — Telehealth: Payer: Self-pay | Admitting: Cardiovascular Disease

## 2024-03-04 VITALS — BP 180/92 | Ht 72.0 in | Wt 241.0 lb

## 2024-03-04 DIAGNOSIS — Z0000 Encounter for general adult medical examination without abnormal findings: Secondary | ICD-10-CM

## 2024-03-04 NOTE — Telephone Encounter (Signed)
 Performed AWV Patient stated his blood pressure this morning was 169/83. Patient stated the last time he saw Dr. Gollan he had him stop Amlodipine  because of the swelling he was having. Patient stated that his blood pressure has been elevated since stopping the Amlodipine . Patient denies headache or any other symptoms at this time and stated he feels fine. Patient stated occasionally he will have a slight headach but does not have one now.. Patient took his blood pressure again before the AWV visit was completed and blood pressure was 180/92. Patient stated that he will call Dr. Gollan as soon as he hangs up and let him know about his blood pressure since stopping the Amlodipine .SABRA

## 2024-03-04 NOTE — Telephone Encounter (Signed)
 Pt c/o BP issue: STAT if pt c/o blurred vision, one-sided weakness or slurred speech.  STAT if BP is GREATER than 180/120 TODAY.  STAT if BP is LESS than 90/60 and SYMPTOMATIC TODAY  1. What is your BP concern? Pt called in to let him know of his discussion he had today with nurse for wellness check on his bp   2. Have you taken any BP medication today? Yes   3. What are your last 5 BP readings?  169/83 today AM, 180/92 today PM   4. Are you having any other symptoms (ex. Dizziness, headache, blurred vision, passed out)? Dizziness and headache when its high    Henrietta Angeline BROCKS, LPN    88/88/74  3:42 PM Note Performed AWV Patient stated his blood pressure this morning was 169/83. Patient stated the last time he saw Dr. Gollan he had him stop Amlodipine  because of the swelling he was having. Patient stated that his blood pressure has been elevated since stopping the Amlodipine . Patient denies headache or any other symptoms at this time and stated he feels fine. Patient stated occasionally he will have a slight headach but does not have one now.. Patient took his blood pressure again before the AWV visit was completed and blood pressure was 180/92. Patient stated that he will call Dr. Gollan as soon as he hangs up and let him know about his blood pressure since stopping the Amlodipine ..

## 2024-03-04 NOTE — Telephone Encounter (Signed)
 Called patient to review message from AWV today - patient states that he has had more headaches and a bit of dizziness since stopping amlodipine . The morning BP in message is prior to taking medications, instructed patient to record BP 1.5 - 2 hours after taking medications - patient verbalized understanding and agreement with plan  Please advise any changes to treatment

## 2024-03-04 NOTE — Patient Instructions (Signed)
 [name],  Thank you for taking the time for your Medicare Wellness Visit. I appreciate your continued commitment to your health goals. Please review the care plan we discussed, and feel free to reach out if I can assist you further.  Please note that Annual Wellness Visits do not include a physical exam. Some assessments may be limited, especially if the visit was conducted virtually. If needed, we may recommend an in-person follow-up with your provider.  Ongoing Care Seeing your primary care provider every 3 to 6 months helps us  monitor your health and provide consistent, personalized care.  Consider updating your covid vaccine and keep a check on your blood pressure.  Referrals If a referral was made during today's visit and you haven't received any updates within two weeks, please contact the referred provider directly to check on the status.  Recommended Screenings:  Health Maintenance  Topic Date Due   COVID-19 Vaccine (4 - 2025-26 season) 12/24/2023   Flu Shot  07/22/2024*   Complete foot exam   04/10/2024   Hemoglobin A1C  04/12/2024   Yearly kidney health urinalysis for diabetes  10/11/2024   Eye exam for diabetics  11/28/2024   Yearly kidney function blood test for diabetes  12/30/2024   Medicare Annual Wellness Visit  03/04/2025   DTaP/Tdap/Td vaccine (2 - Td or Tdap) 10/17/2027   Colon Cancer Screening  05/30/2028   Pneumococcal Vaccine for age over 70  Completed   Hepatitis B Vaccine  Completed   Hepatitis C Screening  Completed   HIV Screening  Completed   Zoster (Shingles) Vaccine  Completed   HPV Vaccine  Aged Out   Meningitis B Vaccine  Aged Out  *Topic was postponed. The date shown is not the original due date.       02/29/2024    9:25 AM  Advanced Directives  Does Patient Have a Medical Advance Directive? Yes  Type of Estate Agent of [place];Living will  Does patient want to make changes to medical advance directive? No - Patient  declined  Copy of Healthcare Power of Attorney in Chart? No - copy requested    Vision: Annual vision screenings are recommended for early detection of glaucoma, cataracts, and diabetic retinopathy. These exams can also reveal signs of chronic conditions such as diabetes and high blood pressure.  Dental: Annual dental screenings help detect early signs of oral cancer, gum disease, and other conditions linked to overall health, including heart disease and diabetes.  Please see the attached documents for additional preventive care recommendations.

## 2024-03-04 NOTE — Progress Notes (Signed)
 Chief Complaint  Patient presents with   Medicare Wellness     Subjective:   [name] is a 58 y.o. male who presents for a Medicare Annual Wellness Visit.  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   Allergy    Anxiety    Anxiety and depression    Arthritis    knees and shoulders   Benzodiazepine withdrawal with complication (HCC) 01/09/2019   Bruit    L   Chest pain    hx   Cognitive complaints with normal neuropsychological exam 10/23/2016   Diabetes mellitus without complication (HCC)    borderline, diet controlled, no meds, patient has lost 30 lbs   Edema    Fatty liver    GERD (gastroesophageal reflux disease)    uses Omeprazole    Goiter    HLD (hyperlipidemia)    Hypertension    essential, benign   Hypothyroidism    Impotence of organic origin    Murmur    never has caused any problems   Neuromuscular disorder (HCC)    neuropathy bilateral feet   Other chest pain    tightness, pressure   Palpitation    hx   Precordial pain    Sleep apnea    uses cpap   Past Surgical History:  Procedure Laterality Date   APPENDECTOMY  2002   done at [facility]   CARDIAC CATHETERIZATION     CARDIAC CATHETERIZATION N/A 08/27/2015   Procedure: Left Heart Cath and Coronary Angiography;  Surgeon: Toribio JONELLE Fuel, MD;  Location: [facility] INVASIVE CV LAB;  Service: Cardiovascular;  Laterality: N/A;   COLONOSCOPY  in his 20's   COLONOSCOPY  2019   COLONOSCOPY WITH PROPOFOL  N/A 05/31/2023   Procedure: COLONOSCOPY WITH PROPOFOL ;  Surgeon: Jinny Carmine, MD;  Location: [facility] ENDOSCOPY;  Service: Endoscopy;  Laterality: N/A;   EAR CYST EXCISION Left 07/08/2019   Procedure: open excision baker's cyst left knee;  Surgeon: Addie Cordella Hamilton, MD;  Location: [facility] OR;  Service: Orthopedics;   Laterality: Left;   HERNIA REPAIR  05/15/2013   umbilical   JOINT REPLACEMENT     right knee   KNEE ARTHROSCOPY Left 12/17/2018   Procedure: left knee arthroscopy, meniscal debridement, loose body removal;  Surgeon: Addie Cordella Hamilton, MD;  Location: [facility] OR;  Service: Orthopedics;  Laterality: Left;   POLYPECTOMY  05/31/2023   Procedure: POLYPECTOMY;  Surgeon: Jinny Carmine, MD;  Location: [facility] ENDOSCOPY;  Service: Endoscopy;;   REVERSE SHOULDER ARTHROPLASTY Right 08/10/2022   Procedure: RIGHT REVERSE SHOULDER ARTHROPLASTY, BICEPS TENODESIS;  Surgeon: Addie Cordella Hamilton, MD;  Location: [facility] OR;  Service: Orthopedics;  Laterality: Right;   REVISION TOTAL KNEE ARTHROPLASTY Right 06/23/2014   DR ADDIE   SHOULDER ARTHROSCOPY WITH ROTATOR CUFF REPAIR AND SUBACROMIAL DECOMPRESSION Right 03/07/2022   Procedure: RIGHT SHOULDER ARTHROSCOPY, DEBRIDEMENT, MINI OPEN ROTATOR CUFF TEAR REPAIR;  Surgeon: Addie Cordella Hamilton, MD;  Location: [facility] OR;  Service: Orthopedics;  Laterality: Right;   TONSILLECTOMY     TOTAL KNEE ARTHROPLASTY Right 2011   right   TOTAL KNEE ARTHROPLASTY Left 02/10/2021   Procedure: LEFT TOTAL KNEE ARTHROPLASTY;  Surgeon: Addie Cordella Hamilton, MD;  Location: [facility] OR;  Service: Orthopedics;  Laterality: Left;   TOTAL KNEE REVISION Right 06/23/2014   Procedure: TOTAL KNEE REVISION;  Surgeon: Cordella Hamilton  Addie, MD;  Location: [facility] OR;  Service: Orthopedics;  Laterality: Right;   UPPER GASTROINTESTINAL ENDOSCOPY  04/30/2013   Jakie QUERY TOOTH EXTRACTION     Family History  Problem Relation Age of Onset   Anxiety disorder Mother    Depression Mother    Hyperlipidemia Mother    Heart disease Father    Diabetes Father    Hypertension Father    Obesity Father    Arthritis Paternal Grandmother    Colon cancer Neg Hx    Colon polyps Neg Hx    Esophageal cancer Neg Hx    Rectal cancer Neg Hx    Stomach cancer Neg Hx    Social History   Occupational History   Occupation:  Acupuncturist: [employer]   Occupation: SERVICE DIRECTOR    Employer: [employer]  Tobacco Use   Smoking status: Former    Current packs/day: 0.00    Average packs/day: 2.0 packs/day for 18.0 years (36.0 ttl pk-yrs)    Types: Cigarettes    Start date: 04/25/1987    Quit date: 04/24/2005    Years since quitting: 18.8   Smokeless tobacco: Never   Tobacco comments:    quit 20 years ago   Vaping Use   Vaping status: Never Used  Substance and Sexual Activity   Alcohol use: Yes    Alcohol/week: 4.0 standard drinks of alcohol    Types: 4 Cans of beer per week    Comment: Occasionally. none last 24 hrs   Drug use: No   Sexual activity: Yes   Tobacco Counseling Counseling given: Not Answered Tobacco comments: quit 20 years ago   SDOH Screenings   Food Insecurity: No Food Insecurity (03/04/2024)  Recent Concern: Food Insecurity - Food Insecurity Present (02/29/2024)  Housing: Low Risk  (02/29/2024)  Transportation Needs: No Transportation Needs (02/29/2024)  Utilities: Not At Risk (02/26/2023)  Alcohol Screen: Low Risk  (02/29/2024)  Depression (PHQ2-9): Low Risk  (03/04/2024)  Recent Concern: Depression (PHQ2-9) - Medium Risk (01/16/2024)  Financial Resource Strain: Low Risk  (02/29/2024)  Physical Activity: Inactive (02/29/2024)  Social Connections: Moderately Integrated (02/29/2024)  Stress: No Stress Concern Present (02/29/2024)  Tobacco Use: Medium Risk (03/04/2024)  Health Literacy: Adequate Health Literacy (02/27/2023)   Depression Screen    03/04/2024    3:29 PM 01/16/2024    1:31 PM 11/09/2023    4:29 PM 11/06/2023   11:32 AM 11/06/2023   11:25 AM 10/12/2023    2:08 PM 07/11/2023    3:43 PM  PHQ 2/9 Scores  PHQ - 2 Score 1 3 0 1 0 2 2  PHQ- 9 Score 2 5  0  1  0  5  4      Data saved with a previous flowsheet row definition     Goals Addressed             This Visit's Progress    Patient Stated       Wants to get his blood  pressure down and keep blood sugar controlled       Visit info / Clinical Intake: Medicare Wellness Visit Type:: Subsequent Annual Wellness Visit Persons participating in visit:: patient Medicare Wellness Visit Mode:: Telephone If telephone:: video declined Because this visit was a virtual/telehealth visit:: pt reported vitals If Telephone or Video please confirm:: I connected with the patient using audio enabled telemedicine application and verified that I am speaking with the correct person using  two identifiers; I discussed the limitations of evaluation and management by telemedicine; The patient expressed understanding and agreed to proceed Patient Location:: Home Provider Location:: Office/Clinic Information given by:: patient Interpreter Needed?: No Pre-visit prep was completed: yes AWV questionnaire completed by patient prior to visit?: yes Date:: 02/29/24 Living arrangements:: (!) lives alone Patient's Overall Health Status Rating: good Typical amount of pain: (!) a lot Does pain affect daily life?: (!) yes Are you currently prescribed opioids?: (!) yes  Dietary Habits and Nutritional Risks How many meals a day?: 2 Eats fruit and vegetables daily?: yes Most meals are obtained by: preparing own meals In the last 2 weeks, have you had any of the following?: none Diabetic:: (!) yes Any non-healing wounds?: no How often do you check your BS?: 2 Would you like to be referred to a Nutritionist or for Diabetic Management? : no  Functional Status Activities of Daily Living (to include ambulation/medication): (Patient-Rptd) Independent Ambulation: (Patient-Rptd) Independent Medication Administration: Independent Home Management: (Patient-Rptd) Independent Manage your own finances?: yes Primary transportation is: driving Concerns about vision?: no *vision screening is required for WTM* Concerns about hearing?: no  Fall Screening Falls in the past year?: (Patient-Rptd)  0 Number of falls in past year: (Patient-Rptd) 0 Was there an injury with Fall?: (Patient-Rptd) 0 Fall Risk Category Calculator: (Patient-Rptd) 0 Patient Fall Risk Level: (Patient-Rptd) Low Fall Risk  Fall Risk Patient at Risk for Falls Due to: No Fall Risks Fall risk Follow up: Falls evaluation completed; Falls prevention discussed  Home and Transportation Safety: All rugs have non-skid backing?: N/A, no rugs All stairs or steps have railings?: N/A, no stairs Grab bars in the bathtub or shower?: (!) no Have non-skid surface in bathtub or shower?: yes Good home lighting?: yes Regular seat belt use?: yes Hospital stays in the last year:: no  Cognitive Assessment Difficulty concentrating, remembering, or making decisions? : no Will 6CIT or Mini Cog be Completed: yes What year is it?: 0 points What month is it?: 0 points Give patient an address phrase to remember (5 components): [address] [place] About what time is it?: 0 points Count backwards from 20 to 1: 0 points Say the months of the year in reverse: 0 points Repeat the address phrase from earlier: 0 points 6 CIT Score: 0 points  Advance Directives (For Healthcare) Does Patient Have a Medical Advance Directive?: Yes Does patient want to make changes to medical advance directive?: No - Patient declined Type of Advance Directive: Healthcare Power of [place]; Living will Copy of Healthcare Power of Attorney in Chart?: No - copy requested Copy of Living Will in Chart?: No - copy requested  Reviewed/Updated  Reviewed/Updated: Reviewed All (Medical, Surgical, Family, Medications, Allergies, Care Teams, Patient Goals)        Objective:    Today's Vitals   03/04/24 1512 03/04/24 1535  BP: (!) 169/83 (!) 180/92  Weight: 241 lb (109.3 kg)   Height: 6' (1.829 m)    Body mass index is 32.69 kg/m.  Current Medications (verified) Outpatient Encounter Medications as of 03/04/2024  Medication Sig   Accu-Chek  Softclix Lancets lancets Use as instructed   albuterol  (VENTOLIN  HFA) 108 (90 Base) MCG/ACT inhaler INHALE 2 PUFFS BY MOUTH EVERY 6 HOURS AS NEEDED FOR WHEEZING OR SHORTNESS OF BREATH   ARIPiprazole  (ABILIFY ) 5 MG tablet Take one tab at bed time   aspirin  EC 81 MG tablet Take 1 tablet (81 mg total) by mouth daily at 6 PM. Swallow whole.  Blood Glucose Monitoring Suppl (ACCU-CHEK GUIDE) w/Device KIT Use to check sugars twice daily   buPROPion  (WELLBUTRIN  XL) 300 MG 24 hr tablet Take 1 tablet (300 mg total) by mouth daily.   busPIRone  (BUSPAR ) 15 MG tablet Take 1 tablet (15 mg total) by mouth 3 (three) times daily.   carvedilol  (COREG ) 25 MG tablet Take 1 tablet by mouth twice daily   celecoxib  (CELEBREX ) 100 MG capsule Take 1 capsule by mouth twice daily   clonazePAM  (KLONOPIN ) 0.5 MG tablet TAKE 1 TABLET BY MOUTH IN THE MORNING AND 2 TABLETS IN THE EVENING (QUANTITY  REDUCTION)   dicyclomine  (BENTYL ) 20 MG tablet TAKE 1 TABLET BY MOUTH EVERY 6 HOURS   ezetimibe  (ZETIA ) 10 MG tablet Take 1 tablet (10 mg total) by mouth daily.   folic acid  (FOLVITE ) 1 MG tablet Take 1 tablet (1 mg total) by mouth daily.   furosemide  (LASIX ) 40 MG tablet Take 1 tablet (40 mg total) by mouth daily.   gabapentin  (NEURONTIN ) 300 MG capsule Take 3 capsules (900 mg total) by mouth 3 (three) times daily.   glipiZIDE  2.5 MG TABS Take 2 tablets by mouth daily before supper.   glucose blood (ACCU-CHEK AVIVA PLUS) test strip USE TO CHECK BLOOD SUGAR UP TO 4 TIMES DAILY.   hydrALAZINE  (APRESOLINE ) 50 MG tablet Take 1 tablet (50 mg total) by mouth 3 (three) times daily.   HYDROcodone -acetaminophen  (NORCO) 10-325 MG tablet Take 1-2 tablets by mouth daily as needed.   isosorbide  mononitrate (IMDUR ) 30 MG 24 hr tablet Take 1 tablet (30 mg total) by mouth daily.   JARDIANCE  10 MG TABS tablet Take 1 tablet (10 mg total) by mouth daily.   Lancets (ACCU-CHEK MULTICLIX) lancets Use to to check blood sugars up to 4 times daily.  ICD-10: E11.40   levothyroxine  (SYNTHROID ) 88 MCG tablet Take 1 tablet (88 mcg total) by mouth daily before breakfast.   losartan  (COZAAR ) 100 MG tablet Take 1 tablet (100 mg total) by mouth daily.   metFORMIN  (GLUCOPHAGE ) 500 MG tablet Take 1 tablet (500 mg total) by mouth 2 (two) times daily with a meal.   methocarbamol  (ROBAXIN ) 500 MG tablet TAKE 1 TABLET BY MOUTH EVERY 8 HOURS AS NEEDED   Multiple Vitamin (MULTIVITAMIN WITH MINERALS) TABS tablet Take 1 tablet by mouth daily.   naloxone  (NARCAN ) nasal spray 4 mg/0.1 mL Use as needed for excessive sedation /opioid overdose   omeprazole  (PRILOSEC) 40 MG capsule TAKE 1 CAPSULE BY MOUTH IN THE MORNING AND 1 CAPSULE IN THE EVENING   promethazine  (PHENERGAN ) 12.5 MG tablet Take 1 tablet (12.5 mg total) by mouth every 4 (four) hours as needed for nausea or vomiting.   rosuvastatin  (CRESTOR ) 40 MG tablet Take 1 tablet (40 mg total) by mouth daily.   traZODone  (DESYREL ) 100 MG tablet TAKE 1 & 1/2 (ONE & ONE-HALF) TABLETS BY MOUTH AT BEDTIME   No facility-administered encounter medications on file as of 03/04/2024.   Hearing/Vision screen Hearing Screening - Comments:: No issues Vision Screening - Comments:: Glasses Reading Eye Up to date Immunizations and Health Maintenance Health Maintenance  Topic Date Due   COVID-19 Vaccine (4 - 2025-26 season) 12/24/2023   Influenza Vaccine  07/22/2024 (Originally 11/23/2023)   FOOT EXAM  04/10/2024   HEMOGLOBIN A1C  04/12/2024   Diabetic kidney evaluation - Urine ACR  10/11/2024   OPHTHALMOLOGY EXAM  11/28/2024   Diabetic kidney evaluation - eGFR measurement  12/30/2024   Medicare Annual Wellness (AWV)  03/04/2025  DTaP/Tdap/Td (2 - Td or Tdap) 10/17/2027   Colonoscopy  05/30/2028   Pneumococcal Vaccine: 50+ Years  Completed   Hepatitis B Vaccines 19-59 Average Risk  Completed   Hepatitis C Screening  Completed   HIV Screening  Completed   Zoster Vaccines- Shingrix  Completed   HPV VACCINES  Aged  Out   Meningococcal B Vaccine  Aged Out        Assessment/Plan:  This is a routine wellness examination for [name].  Patient Care Team: Marylynn Verneita CROME, MD as PCP - General (Internal Medicine) Perla Evalene PARAS, MD as PCP - Cardiology (Cardiology) Dessa, Reyes ORN, MD (General Surgery) Larina Lilia HERO, NP as Nurse Practitioner (Nurse Practitioner) Marylynn Verneita CROME, MD (Internal Medicine)  I have personally reviewed and noted the following in the patient's chart:   Medical and social history Use of alcohol, tobacco or illicit drugs  Current medications and supplements including opioid prescriptions. Functional ability and status Nutritional status Physical activity Advanced directives List of other physicians Hospitalizations, surgeries, and ER visits in previous 12 months Vitals Screenings to include cognitive, depression, and falls Referrals and appointments  No orders of the defined types were placed in this encounter.  In addition, I have reviewed and discussed with patient certain preventive protocols, quality metrics, and best practice recommendations. A written personalized care plan for preventive services as well as general preventive health recommendations were provided to patient.   Angeline Fredericks, LPN   [id number]/88/7974   Return in 1 year (on 03/04/2025).  After Visit Summary: (MyChart) Due to this being a telephonic visit, the after visit summary with patients personalized plan was offered to patient via MyChart   Nurse Notes: Patient declines covid vaccine.

## 2024-03-04 NOTE — Telephone Encounter (Signed)
 See phone note patient has reached out to Dr. Gollan.

## 2024-03-05 ENCOUNTER — Other Ambulatory Visit: Payer: Self-pay

## 2024-03-05 ENCOUNTER — Other Ambulatory Visit: Payer: Self-pay | Admitting: Internal Medicine

## 2024-03-05 ENCOUNTER — Ambulatory Visit: Payer: Self-pay

## 2024-03-05 MED ORDER — AMLODIPINE BESYLATE 2.5 MG PO TABS: 2.5000 mg | ORAL_TABLET | Freq: Every day | ORAL | 3 refills | Status: AC

## 2024-03-05 NOTE — Telephone Encounter (Signed)
   FYI Only or Action Required?: Action required by provider: clinical question for provider.  Patient was last seen in primary care on 01/16/2024 by Marylynn Verneita CROME, MD.  Called Nurse Triage reporting Advice Only.  Symptoms began yesterday.  ITriage Disposition: Information or Advice Only Call  Patient/caregiver understands and will follow disposition?:    Copied from CRM [id number]. Topic: Clinical - Red Word Triage >> Mar 05, 2024  4:34 PM Tysheama G wrote: Red Word that prompted transfer to Nurse Triage: Patient has a high blood pressure reading of  174/76, 148/77 Reason for Disposition  Health information question, no triage required and triager able to answer question  Answer Assessment - Initial Assessment Questions 1. REASON FOR CALL: What is the main reason for your call? or How can I best help you? Pt called in concerned about high BP and still waiting on response from cardiology office.  Pt reports 174/76 BP with no s/s of high BP.  Nurse looked up protocol and stated pt could follow-up with PCP within x 3 days with no s/s at present BP therefore reassured pt ok to wait for response from office unless s/s of high BP begins and/or increase in BP.  Pt rechecked BP while on the phone with nurse and currently 148/77 with no s/s of high BP.  Nurse and pt both confirm the BP is better than before and nurse stated at present time no recommendations to do anything different at present time.  Nurse advised pt to check BP daily and continue to monitor for s/s of high BP, instructed pt to call back if things change and informed pt will send encounter to PCP office for review: pt verbalized understanding.  Protocols used: Information Only Call - No Triage-A-AH

## 2024-03-05 NOTE — Telephone Encounter (Signed)
 Pt calling back for recommendation about high bp. Please Advise.

## 2024-03-05 NOTE — Telephone Encounter (Signed)
 I recommend resuming amlodipine , albeit at 2.5 mg daily.  He should try to limit his sodium intake and continue monitoring his blood pressure.  I will defer further medication adjustments and follow-up to Dr. Gollan.  Lonni Hanson, MD [facility]

## 2024-03-06 NOTE — Telephone Encounter (Unsigned)
 Copied from CRM [id number]. Topic: Clinical - Medical Advice >> Mar 05, 2024  4:32 PM Terri G wrote: Reason for CRM: Patient is calling stating he would like to speak to the person that did his AWV yesterday. Callback number [phone]

## 2024-03-06 NOTE — Telephone Encounter (Signed)
 Called patient with medication adjustment recommendation per Dr End. Patient verbalized understanding. Will pick up medication from [facility] today and restart.

## 2024-03-06 NOTE — Telephone Encounter (Signed)
 Pt is aware and gave a verbal understanding.

## 2024-03-07 NOTE — Telephone Encounter (Signed)
 Called patient and notified him of the following from Dr. Gollan.  The nurse welfare check could have pushed his blood pressure up Can he check his blood pressure when he is by himself, sitting, 2 hours after medications Need to trend over several days not just 1 day   Blood pressure was recently well-controlled with Dr. Marylynn, 120 systolic. Thx TGollan  Patient verbalizes understanding.

## 2024-03-07 NOTE — Telephone Encounter (Signed)
 Called patient and was advised that this was taken care of by Dr. Tresia office. Patient stated that he was calling about a refill and Dr. Gollan sent the refill in for him.  Nothing else needed per patient.

## 2024-03-09 ENCOUNTER — Other Ambulatory Visit: Payer: Self-pay | Admitting: Internal Medicine

## 2024-03-19 ENCOUNTER — Other Ambulatory Visit: Payer: Self-pay | Admitting: Internal Medicine

## 2024-03-24 NOTE — Telephone Encounter (Signed)
 Last OV 01/16/24 lat refill on clonazepam  01/3024.

## 2024-03-25 ENCOUNTER — Other Ambulatory Visit: Payer: Self-pay

## 2024-03-25 ENCOUNTER — Encounter: Payer: Self-pay | Admitting: Internal Medicine

## 2024-03-27 ENCOUNTER — Encounter: Payer: Self-pay | Admitting: Internal Medicine

## 2024-03-29 ENCOUNTER — Encounter: Payer: Self-pay | Admitting: Internal Medicine

## 2024-03-29 MED ORDER — HYDROCODONE-ACETAMINOPHEN 10-325 MG PO TABS: 1.0000 | ORAL_TABLET | Freq: Every day | ORAL | 0 refills | Status: DC | PRN

## 2024-03-31 MED ORDER — ACCU-CHEK AVIVA PLUS VI STRP: ORAL_STRIP | 3 refills | Status: AC

## 2024-04-02 ENCOUNTER — Other Ambulatory Visit: Payer: Self-pay | Admitting: Surgical

## 2024-04-08 ENCOUNTER — Telehealth: Payer: Self-pay

## 2024-04-08 MED ORDER — ACCU-CHEK GUIDE TEST VI STRP: ORAL_STRIP | 3 refills | Status: AC

## 2024-04-08 NOTE — Addendum Note (Signed)
 Addended by: HARRIETTE RAISIN on: 04/08/2024 03:39 PM   Modules accepted: Orders

## 2024-04-08 NOTE — Telephone Encounter (Signed)
 Copied from CRM #[id number]. Topic: Clinical - Prescription Issue >> Apr 07, 2024  4:50 PM Dedra B wrote: Reason for CRM: Pt requested Accu-Chek Guide Me test strips but glucose blood (ACCU-CHEK AVIVA PLUS) test strips were sent to pharmacy. Pls send Guide strips to [facility] on [address]. Pt would like a call from nurse regarding this and the fact that he has been unable to check his blood sugar due to not having the correct strips.

## 2024-04-08 NOTE — Telephone Encounter (Signed)
 Correct test strips have been sent in and pt is aware.

## 2024-04-11 ENCOUNTER — Other Ambulatory Visit: Payer: Self-pay | Admitting: Internal Medicine

## 2024-04-16 ENCOUNTER — Other Ambulatory Visit: Payer: Self-pay | Admitting: Internal Medicine

## 2024-04-18 NOTE — Telephone Encounter (Signed)
 Pt has enough to get him through till Monday when Dr. Marylynn returns to office.   Refilled: 03/24/2024 Last OV: 01/16/2024 Next OV: 04/25/2024

## 2024-04-21 ENCOUNTER — Other Ambulatory Visit: Payer: Self-pay | Admitting: Medical Genetics

## 2024-04-22 ENCOUNTER — Other Ambulatory Visit

## 2024-04-23 ENCOUNTER — Other Ambulatory Visit (INDEPENDENT_AMBULATORY_CARE_PROVIDER_SITE_OTHER)

## 2024-04-23 DIAGNOSIS — E1169 Type 2 diabetes mellitus with other specified complication: Secondary | ICD-10-CM

## 2024-04-23 DIAGNOSIS — E785 Hyperlipidemia, unspecified: Secondary | ICD-10-CM | POA: Diagnosis not present

## 2024-04-23 LAB — LIPID PANEL
Cholesterol: 70 mg/dL (ref 28–200)
HDL: 30.4 mg/dL — ABNORMAL LOW
LDL Cholesterol: 21 mg/dL (ref 10–99)
NonHDL: 39.95
Total CHOL/HDL Ratio: 2
Triglycerides: 93 mg/dL (ref 10.0–149.0)
VLDL: 18.6 mg/dL (ref 0.0–40.0)

## 2024-04-23 LAB — LDL CHOLESTEROL, DIRECT: Direct LDL: 36 mg/dL

## 2024-04-24 ENCOUNTER — Ambulatory Visit: Payer: Self-pay | Admitting: Internal Medicine

## 2024-04-25 ENCOUNTER — Ambulatory Visit: Admitting: Internal Medicine

## 2024-04-25 ENCOUNTER — Encounter: Payer: Self-pay | Admitting: Internal Medicine

## 2024-04-25 VITALS — BP 134/82 | HR 72 | Ht 72.0 in | Wt 239.0 lb

## 2024-04-25 DIAGNOSIS — E119 Type 2 diabetes mellitus without complications: Secondary | ICD-10-CM

## 2024-04-25 DIAGNOSIS — E785 Hyperlipidemia, unspecified: Secondary | ICD-10-CM

## 2024-04-25 DIAGNOSIS — F331 Major depressive disorder, recurrent, moderate: Secondary | ICD-10-CM

## 2024-04-25 DIAGNOSIS — F411 Generalized anxiety disorder: Secondary | ICD-10-CM | POA: Diagnosis not present

## 2024-04-25 DIAGNOSIS — R600 Localized edema: Secondary | ICD-10-CM | POA: Diagnosis not present

## 2024-04-25 DIAGNOSIS — K760 Fatty (change of) liver, not elsewhere classified: Secondary | ICD-10-CM

## 2024-04-25 DIAGNOSIS — E034 Atrophy of thyroid (acquired): Secondary | ICD-10-CM | POA: Diagnosis not present

## 2024-04-25 DIAGNOSIS — Z7984 Long term (current) use of oral hypoglycemic drugs: Secondary | ICD-10-CM | POA: Diagnosis not present

## 2024-04-25 DIAGNOSIS — E1169 Type 2 diabetes mellitus with other specified complication: Secondary | ICD-10-CM | POA: Diagnosis not present

## 2024-04-25 LAB — POCT GLYCOSYLATED HEMOGLOBIN (HGB A1C): Hemoglobin A1C: 6 % — AB (ref 4.0–5.6)

## 2024-04-25 MED ORDER — JARDIANCE 10 MG PO TABS: 10.0000 mg | ORAL_TABLET | Freq: Every day | ORAL | 0 refills | Status: AC

## 2024-04-25 MED ORDER — OMEPRAZOLE 40 MG PO CPDR: DELAYED_RELEASE_CAPSULE | ORAL | 3 refills | Status: AC

## 2024-04-25 MED ORDER — HYDROCODONE-ACETAMINOPHEN 10-325 MG PO TABS: 1.0000 | ORAL_TABLET | Freq: Every day | ORAL | 0 refills | Status: AC | PRN

## 2024-04-25 MED ORDER — EZETIMIBE 10 MG PO TABS: 10.0000 mg | ORAL_TABLET | Freq: Every day | ORAL | 3 refills | Status: AC

## 2024-04-25 MED ORDER — BUSPIRONE HCL 15 MG PO TABS: 15.0000 mg | ORAL_TABLET | Freq: Three times a day (TID) | ORAL | 3 refills | Status: AC

## 2024-04-25 MED ORDER — GABAPENTIN 300 MG PO CAPS: 900.0000 mg | ORAL_CAPSULE | Freq: Three times a day (TID) | ORAL | 0 refills | Status: AC

## 2024-04-25 MED ORDER — CLONAZEPAM 0.5 MG PO TABS: ORAL_TABLET | ORAL | 0 refills | Status: AC

## 2024-04-25 MED ORDER — DEXCOM G7 SENSOR MISC: Status: AC

## 2024-04-25 NOTE — Patient Instructions (Addendum)
" °  YOu are on 5  medications that may be contributing to your swelling   Amlodipine ,  isosorbide , hydralazine  , gabapentin  and celebrex    Stop your celebrex    Please reduce your clonazepam  dose at bedtime to 1/2 tablet if you can tolerate it.  Use the trazodone  every night for sleep   Your A1c is excellent !  We MAY need to reduce or stop the glipizide  depending on your pattern/occurrence of low blood sugars  "

## 2024-04-25 NOTE — Assessment & Plan Note (Signed)
 Secondary to use of amlodipine  celebrex , gabapentin ,  hydralazine  and imdur .  STOPPING celebex (prescribed by orthopedist) given concurrent dx of fatty liver.

## 2024-04-25 NOTE — Progress Notes (Signed)
 "  Subjective:  Patient ID: [name], male    DOB: April 02, 1966  Age: 59 y.o. MRN: [id number]  CC: The primary encounter diagnosis was Hyperlipidemia associated with type 2 diabetes mellitus (HCC). Diagnoses of Diabetes mellitus treated with oral medication (HCC), GAD (generalized anxiety disorder), MDD (major depressive disorder), recurrent episode, moderate (HCC), Hypothyroidism due to acquired atrophy of thyroid , Bilateral leg edema, and Fatty liver were also pertinent to this visit.   HPI [name] presents for  Chief Complaint  Patient presents with   Medical Management of Chronic Issues   1) Type 2 DM/HTN/obesity:  he  feels generally well,  But is not  exercising regularly or trying to lose weight. Has not been checking  blood sugars lately ut has had several episodes of late night hypoglycemic symptoms..  Taking  5 mg  glipizide  before dinner, metformin ,  and Jardiance  as directed. Following a carbohydrate modified diet 6 days per week. SABRA Appetite is good.  Hypertension: patient checks blood pressure twice weekly at home.  Readings have been for the most part <130/80 at rest . Patient is following a reduced salt diet most days and is taking medications as prescribed   2) Chronic pain: he continues to require use of hydrocodone  twice daily  to manage pain secondary cervical and lumbar spinal stenosis , right shoulder pain and knee pain .  Orthopedics has prescribed celebrex     3) GAD/depression;  he is seeing psychiatry for management  of depression .  WE have been gradually weaning his clonazepam  dose .    Outpatient Medications Prior to Visit  Medication Sig Dispense Refill   Accu-Chek Softclix Lancets lancets Use as instructed 100 each 12   albuterol  (VENTOLIN  HFA) 108 (90 Base) MCG/ACT inhaler INHALE 2 PUFFS BY MOUTH EVERY 6 HOURS AS NEEDED FOR WHEEZING OR SHORTNESS OF BREATH 9 g 0   amLODipine  (NORVASC ) 2.5 MG tablet Take 1 tablet (2.5 mg total) by mouth daily. 90 tablet  3   ARIPiprazole  (ABILIFY ) 5 MG tablet Take one tab at bed time 90 tablet 0   aspirin  EC 81 MG tablet Take 1 tablet (81 mg total) by mouth daily at 6 PM. Swallow whole. 30 tablet 12   Blood Glucose Monitoring Suppl (ACCU-CHEK GUIDE) w/Device KIT Use to check sugars twice daily 1 kit 2   buPROPion  (WELLBUTRIN  XL) 300 MG 24 hr tablet Take 1 tablet (300 mg total) by mouth daily. 90 tablet 0   carvedilol  (COREG ) 25 MG tablet Take 1 tablet by mouth twice daily 180 tablet 1   dicyclomine  (BENTYL ) 20 MG tablet TAKE 1 TABLET BY MOUTH EVERY 6 HOURS 360 tablet 0   furosemide  (LASIX ) 40 MG tablet Take 1 tablet (40 mg total) by mouth daily. 90 tablet 3   glipiZIDE  2.5 MG TABS Take 2 tablets by mouth daily before supper. 180 tablet 1   glucose blood (ACCU-CHEK AVIVA PLUS) test strip USE TO CHECK BLOOD SUGAR UP TO 4 TIMES DAILY. 400 each 3   glucose blood (ACCU-CHEK GUIDE TEST) test strip Use to check blood sugar 3 times daily. 300 each 3   hydrALAZINE  (APRESOLINE ) 50 MG tablet TAKE 1 TABLET BY MOUTH THREE TIMES DAILY 90 tablet 0   isosorbide  mononitrate (IMDUR ) 30 MG 24 hr tablet Take 1 tablet (30 mg total) by mouth daily. 90 tablet 3   Lancets (ACCU-CHEK MULTICLIX) lancets Use to to check blood sugars up to 4 times daily. ICD-10: E11.40 400 each 0  levothyroxine  (SYNTHROID ) 88 MCG tablet Take 1 tablet (88 mcg total) by mouth daily before breakfast. 90 tablet 1   losartan  (COZAAR ) 100 MG tablet Take 1 tablet (100 mg total) by mouth daily. 90 tablet 1   metFORMIN  (GLUCOPHAGE ) 500 MG tablet Take 1 tablet (500 mg total) by mouth 2 (two) times daily with a meal. 180 tablet 1   methocarbamol  (ROBAXIN ) 500 MG tablet TAKE 1 TABLET BY MOUTH EVERY 8 HOURS AS NEEDED 90 tablet 2   Multiple Vitamin (MULTIVITAMIN WITH MINERALS) TABS tablet Take 1 tablet by mouth daily.     naloxone  (NARCAN ) nasal spray 4 mg/0.1 mL Use as needed for excessive sedation /opioid overdose 1 each 1   promethazine  (PHENERGAN ) 12.5 MG tablet  Take 1 tablet (12.5 mg total) by mouth every 4 (four) hours as needed for nausea or vomiting. 20 tablet 0   rosuvastatin  (CRESTOR ) 40 MG tablet Take 1 tablet (40 mg total) by mouth daily. 90 tablet 1   traZODone  (DESYREL ) 100 MG tablet TAKE 1 & 1/2 (ONE & ONE-HALF) TABLETS BY MOUTH AT BEDTIME 135 tablet 3   busPIRone  (BUSPAR ) 15 MG tablet Take 1 tablet (15 mg total) by mouth 3 (three) times daily. 270 tablet 3   celecoxib  (CELEBREX ) 100 MG capsule Take 1 capsule by mouth twice daily 60 capsule 0   clonazePAM  (KLONOPIN ) 0.5 MG tablet TAKE 1 TABLET BY MOUTH IN THE MORNING AND 2 TABLETS IN THE EVENING AS NEEDED FOR  ANXIETY 90 tablet 2   ezetimibe  (ZETIA ) 10 MG tablet Take 1 tablet (10 mg total) by mouth daily. 90 tablet 3   folic acid  (FOLVITE ) 1 MG tablet Take 1 tablet (1 mg total) by mouth daily. 90 tablet 0   gabapentin  (NEURONTIN ) 300 MG capsule TAKE 3 CAPSULES BY MOUTH THREE TIMES DAILY 810 capsule 0   HYDROcodone -acetaminophen  (NORCO) 10-325 MG tablet Take 1-2 tablets by mouth daily as needed. 60 tablet 0   JARDIANCE  10 MG TABS tablet Take 1 tablet (10 mg total) by mouth daily. 90 tablet 0   omeprazole  (PRILOSEC) 40 MG capsule TAKE 1 CAPSULE BY MOUTH IN THE MORNING AND 1 CAPSULE IN THE EVENING 180 capsule 3   No facility-administered medications prior to visit.    Review of Systems;  Patient denies headache, fevers, malaise, unintentional weight loss, skin rash, eye pain, sinus congestion and sinus pain, sore throat, dysphagia,  hemoptysis , cough, dyspnea, wheezing, chest pain, palpitations, orthopnea, edema, abdominal pain, nausea, melena, diarrhea, constipation, flank pain, dysuria, hematuria, urinary  Frequency, nocturia, numbness, tingling, seizures,  Focal weakness, Loss of consciousness,  Tremor, and suicidal ideation.      Objective:  BP 134/82   Pulse 72   Ht 6' (1.829 m)   Wt 239 lb (108.4 kg)   SpO2 93%   BMI 32.41 kg/m   BP Readings from Last 3 Encounters:  04/25/24  134/82  03/04/24 (!) 180/92  01/16/24 122/68    Wt Readings from Last 3 Encounters:  04/25/24 239 lb (108.4 kg)  03/04/24 241 lb (109.3 kg)  01/16/24 236 lb 12.8 oz (107.4 kg)    Physical Exam Vitals reviewed.  Constitutional:      General: He is not in acute distress.    Appearance: Normal appearance. He is normal weight. He is not ill-appearing, toxic-appearing or diaphoretic.  HENT:     Head: Normocephalic.  Eyes:     General: No scleral icterus.       Right eye: No discharge.  Left eye: No discharge.     Conjunctiva/sclera: Conjunctivae normal.  Cardiovascular:     Rate and Rhythm: Normal rate and regular rhythm.     Heart sounds: Normal heart sounds.  Pulmonary:     Effort: Pulmonary effort is normal. No respiratory distress.     Breath sounds: Normal breath sounds.  Musculoskeletal:        General: Normal range of motion.     Cervical back: Normal range of motion.  Skin:    General: Skin is warm and dry.  Neurological:     General: No focal deficit present.     Mental Status: He is alert and oriented to person, place, and time. Mental status is at baseline.  Psychiatric:        Mood and Affect: Mood normal.        Behavior: Behavior normal.        Thought Content: Thought content normal.        Judgment: Judgment normal.     Lab Results  Component Value Date   HGBA1C 6.0 (A) 04/25/2024   HGBA1C 6.4 (H) 10/12/2023   HGBA1C 6.9 (H) 04/11/2023    Lab Results  Component Value Date   CREATININE 0.88 04/25/2024   CREATININE 1.11 12/31/2023   CREATININE 1.23 10/28/2023    Lab Results  Component Value Date   WBC 7.1 12/31/2023   HGB 15.0 12/31/2023   HCT 47.0 12/31/2023   PLT 231 12/31/2023   GLUCOSE 81 04/25/2024   CHOL 70 04/23/2024   TRIG 93.0 04/23/2024   HDL 30.40 (L) 04/23/2024   LDLDIRECT 36.0 04/23/2024   LDLCALC 21 04/23/2024   ALT 26 04/25/2024   AST 24 04/25/2024   NA 143 04/25/2024   K 3.6 04/25/2024   CL 99 04/25/2024    CREATININE 0.88 04/25/2024   BUN 14 04/25/2024   CO2 30 (H) 04/25/2024   TSH 1.930 04/25/2024   PSA 0.70 10/16/2017   INR 0.98 08/27/2015   HGBA1C 6.0 (A) 04/25/2024   MICROALBUR 47.1 (H) 01/09/2019    DG Toe Great Left Result Date: 12/31/2023 CLINICAL DATA:  Left great toe numbness and discoloration EXAM: LEFT GREAT TOE COMPARISON:  01/12/2020 FINDINGS: Frontal, oblique, lateral views of the left great toe are obtained. Postsurgical changes are seen from arthroplasty at the first MTP joint, with no evidence of complication. There are no acute displaced fractures. 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 Soft tissues appear unremarkable. IMPRESSION: 1. Unremarkable first MTP joint arthroplasty. 2. 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 3. No acute fracture. Electronically Signed   By: Ozell Daring M.D.   On: 12/31/2023 20:22    Assessment & Plan:  .Hyperlipidemia associated with type 2 diabetes mellitus (HCC) -     Comprehensive metabolic panel with GFR  Diabetes mellitus treated with oral medication (HCC) Assessment & Plan: Glycemic control is excellet. But he may be having  nocturnal hypoglycemia.taking 5 mg glipizide  at dinner.   DEXCOM 7 placed for monitoring  . Proteinuria  has improved with Jardiance .   Lab Results  Component Value Date   HGBA1C 6.0 (A) 04/25/2024   Lab Results  Component Value Date   LABMICR 4.1 10/12/2023   MICROALBUR 47.1 (H) 01/09/2019       Orders: -     POCT glycosylated hemoglobin (Hb A1C)  GAD (generalized anxiety disorder) Assessment & Plan: Continue buspirone  and weaning of clonazepam   with another reduction in the evening dose  by 0.25 mg today  Orders: -     busPIRone  HCl; Take 1 tablet (15 mg total) by mouth 3 (three) times daily.  Dispense: 270 tablet; Refill: 3  MDD (major depressive disorder), recurrent episode, moderate (HCC) -     busPIRone  HCl; Take 1 tablet (15 mg total) by mouth 3 (three)  times daily.  Dispense: 270 tablet; Refill: 3  Hypothyroidism due to acquired atrophy of thyroid  Assessment & Plan: Thyroid  function is WNL on current dose of 88 mcg.   No current changes needed.    Lab Results  Component Value Date   TSH 1.930 04/25/2024     Orders: -     TSH  Bilateral leg edema Assessment & Plan: Secondary to use of amlodipine  celebrex , gabapentin ,  hydralazine  and imdur .  STOPPING celebex (prescribed by orthopedist) given concurrent dx of fatty liver.    Fatty liver Assessment & Plan: Liver enzymes normalized with alcohol abstinence and weight loss .  Continue statin and metformin     Lab Results  Component Value Date   ALT 26 04/25/2024   AST 24 04/25/2024   ALKPHOS 81 04/25/2024   BILITOT 0.6 04/25/2024   Lab Results  Component Value Date   HGBA1C 6.0 (A) 04/25/2024      Other orders -     Ezetimibe ; Take 1 tablet (10 mg total) by mouth daily.  Dispense: 90 tablet; Refill: 3 -     Gabapentin ; Take 3 capsules (900 mg total) by mouth 3 (three) times daily.  Dispense: 810 capsule; Refill: 0 -     Jardiance ; Take 1 tablet (10 mg total) by mouth daily.  Dispense: 90 tablet; Refill: 0 -     Omeprazole ; TAKE 1 CAPSULE BY MOUTH IN THE MORNING AND 1 CAPSULE IN THE EVENING  Dispense: 180 capsule; Refill: 3 -     Dexcom G7 Sensor; Use to record occurrence of hypoglycemia in type 2 Diabetes -     clonazePAM ; TAKE 1 TABLET BY MOUTH IN THE MORNING AND 1.5  TABLETS IN THE EVENING as needed for anxiety  Dispense: 75 tablet; Refill: 0 -     HYDROcodone -Acetaminophen ; Take 1-2 tablets by mouth daily as needed.  Dispense: 60 tablet; Refill: 0   I personally spent a total of 30 minutes in the care of the patient today including getting/reviewing separately obtained history, performing a medically appropriate exam/evaluation, counseling and educating, placing orders, documenting clinical information in the EHR, and communicating results  .  Follow-up: No follow-ups  on file.   Verneita LITTIE Kettering, MD "

## 2024-04-26 LAB — COMPREHENSIVE METABOLIC PANEL WITH GFR
ALT: 26 IU/L (ref 0–44)
AST: 24 IU/L (ref 0–40)
Albumin: 4.5 g/dL (ref 3.8–4.9)
Alkaline Phosphatase: 81 IU/L (ref 47–123)
BUN/Creatinine Ratio: 16 (ref 9–20)
BUN: 14 mg/dL (ref 6–24)
Bilirubin Total: 0.6 mg/dL (ref 0.0–1.2)
CO2: 30 mmol/L — ABNORMAL HIGH (ref 20–29)
Calcium: 9.2 mg/dL (ref 8.7–10.2)
Chloride: 99 mmol/L (ref 96–106)
Creatinine, Ser: 0.88 mg/dL (ref 0.76–1.27)
Globulin, Total: 2.3 g/dL (ref 1.5–4.5)
Glucose: 81 mg/dL (ref 70–99)
Potassium: 3.6 mmol/L (ref 3.5–5.2)
Sodium: 143 mmol/L (ref 134–144)
Total Protein: 6.8 g/dL (ref 6.0–8.5)
eGFR: 99 mL/min/1.73

## 2024-04-26 LAB — TSH: TSH: 1.93 u[IU]/mL (ref 0.450–4.500)

## 2024-04-26 NOTE — Assessment & Plan Note (Signed)
 Glycemic control is excellet. But he may be having  nocturnal hypoglycemia.taking 5 mg glipizide  at dinner.   DEXCOM 7 placed for monitoring  . Proteinuria  has improved with Jardiance .   Lab Results  Component Value Date   HGBA1C 6.0 (A) 04/25/2024   Lab Results  Component Value Date   LABMICR 4.1 10/12/2023   MICROALBUR 47.1 (H) 01/09/2019

## 2024-04-26 NOTE — Assessment & Plan Note (Signed)
 Thyroid  function is WNL on current dose of 88 mcg.   No current changes needed.    Lab Results  Component Value Date   TSH 1.930 04/25/2024

## 2024-04-26 NOTE — Assessment & Plan Note (Addendum)
 Continue buspirone  and weaning of clonazepam   with another reduction in the evening dose  by 0.25 mg today

## 2024-04-26 NOTE — Assessment & Plan Note (Signed)
 Liver enzymes normalized with alcohol abstinence and weight loss .  Continue statin and metformin     Lab Results  Component Value Date   ALT 26 04/25/2024   AST 24 04/25/2024   ALKPHOS 81 04/25/2024   BILITOT 0.6 04/25/2024   Lab Results  Component Value Date   HGBA1C 6.0 (A) 04/25/2024

## 2024-04-27 ENCOUNTER — Ambulatory Visit: Payer: Self-pay | Admitting: Internal Medicine

## 2024-04-28 ENCOUNTER — Telehealth (HOSPITAL_COMMUNITY): Admitting: Psychiatry

## 2024-04-29 ENCOUNTER — Other Ambulatory Visit
Admission: RE | Admit: 2024-04-29 | Discharge: 2024-04-29 | Disposition: A | Discharge status: Home / Self Care | Payer: Self-pay | Source: Ambulatory Visit | Attending: Medical Genetics | Admitting: Medical Genetics

## 2024-05-01 ENCOUNTER — Other Ambulatory Visit: Payer: Self-pay | Admitting: Internal Medicine

## 2024-05-01 ENCOUNTER — Ambulatory Visit: Payer: Self-pay

## 2024-05-01 DIAGNOSIS — E119 Type 2 diabetes mellitus without complications: Secondary | ICD-10-CM

## 2024-05-01 NOTE — Telephone Encounter (Addendum)
 Pt wearing DexCom and reports blood sugars have been dropping recently, reporting he doesn't feel good when it happens. Currently 89 after eating. Was 49 overnight. Pt instructed to go to ED or UC if symptoms worsen. Pt would like an appointment with PCP if one becomes available.   BS 89 at 1358  FYI Only or Action Required?: Action required by provider: request for appointment and update on patient condition.  Patient was last seen in primary care on 04/25/2024 by Marylynn Verneita CROME, MD.  Called Nurse Triage reporting Hypoglycemia.  Symptoms began yesterday.  Interventions attempted: Nothing.  Symptoms are: gradually worsening.  Triage Disposition: Call PCP Within 24 Hours  Patient/caregiver understands and will follow disposition?: Yes  BS 66, CALL BACK AT 2PM EST Pt instructed to drink Sprite and order food (he is currently at a restaurant), and I will call back in 1 hour to check on his sugar.   Reason for Triage: Patient's blood sugar last night of 49 and today it is 72. He states it is still dropping but no current symptoms. He was shaky last night but not today.   Patient callback is [phone] (home)    Reason for Disposition  [1] Blood glucose 70 mg/dL (3.9 mmol/L) or below OR symptomatic, now improved with Care Advice AND [2] cause unknown  Answer Assessment - Initial Assessment Questions 1. SYMPTOMS: What symptoms are you concerned about?     Low blood sugars and states he doesn't feel good  2. ONSET:  When did the symptoms start?     Last night  3. BLOOD GLUCOSE: What is your blood glucose level?      64  4. USUAL RANGE: What is your blood glucose level usually? (e.g., usual fasting morning value, usual evening value)     100-120  5. TYPE 1 or 2:  Do you know what type of diabetes you have?  (e.g., Type 1, Type 2, Gestational; doesn't know)      Not diabetic  6. INSULIN : Do you take insulin ? What type of insulin (s) do you use? What is the  mode of delivery? (syringe, pen; injection or pump) When did you last give yourself an insulin  dose? (i.e., time or hours/minutes ago) How much did you give? (i.e., how many units)     Denies  7. DIABETES PILLS: Do you take any pills for your diabetes? If Yes, ask: What is the name of the medicine(s) that you take for high blood sugar?     Metformin , last took it this morning  8. OTHER SYMPTOMS: Do you have any symptoms? (e.g., fever, frequent urination, difficulty breathing, vomiting)     Pt reports he  9. LOW BLOOD GLUCOSE TREATMENT: What have you done so far to treat the low blood glucose level? Just ate a salad      10. FOOD: When did you last eat or drink?       Salad  11. ALONE: Are you alone right now or is someone with you?        Someone is with him  Protocols used: Diabetes - Low Blood Sugar-A-AH

## 2024-05-01 NOTE — Telephone Encounter (Signed)
 [name]

## 2024-05-01 NOTE — Telephone Encounter (Signed)
 Spoke to pt discuss note below per Dr Marylynn, he verbalizes understanding, will call back if numbers are still low and schedule f/up

## 2024-05-01 NOTE — Assessment & Plan Note (Signed)
 Stopping glipizide  due to recurrent hypoglycemia with addition of Jardiance    Lab Results  Component Value Date   HGBA1C 6.0 (A) 04/25/2024

## 2024-05-02 ENCOUNTER — Other Ambulatory Visit: Payer: Self-pay | Admitting: Internal Medicine

## 2024-05-08 ENCOUNTER — Other Ambulatory Visit: Payer: Self-pay | Admitting: Surgical

## 2024-05-08 ENCOUNTER — Other Ambulatory Visit: Payer: Self-pay | Admitting: Internal Medicine

## 2024-05-09 LAB — GENECONNECT MOLECULAR SCREEN: Genetic Analysis Overall Interpretation: NEGATIVE

## 2024-05-12 ENCOUNTER — Encounter: Payer: Self-pay | Admitting: Internal Medicine

## 2024-05-13 ENCOUNTER — Telehealth (HOSPITAL_COMMUNITY): Admitting: Psychiatry

## 2024-05-13 ENCOUNTER — Encounter (HOSPITAL_COMMUNITY): Payer: Self-pay | Admitting: Psychiatry

## 2024-05-13 VITALS — Wt 239.0 lb

## 2024-05-13 DIAGNOSIS — F419 Anxiety disorder, unspecified: Secondary | ICD-10-CM

## 2024-05-13 DIAGNOSIS — F331 Major depressive disorder, recurrent, moderate: Secondary | ICD-10-CM | POA: Diagnosis not present

## 2024-05-13 MED ORDER — BUPROPION HCL ER (XL) 300 MG PO TB24: 300.0000 mg | ORAL_TABLET | Freq: Every day | ORAL | 1 refills | Status: AC

## 2024-05-13 MED ORDER — ARIPIPRAZOLE 5 MG PO TABS: ORAL_TABLET | ORAL | 1 refills | Status: AC

## 2024-05-13 NOTE — Progress Notes (Signed)
 " [facility] MD Virtual Progress Note   Patient Location: Home Provider Location: Home Office  I connect with patient by telephone and verified that I am speaking with correct person by using two identifiers. I discussed the limitations of evaluation and management by telemedicine and the availability of in person appointments. I also discussed with the patient that there may be a patient responsible charge related to this service. The patient expressed understanding and agreed to proceed.  [name] [id number] [id number] y.o.  05/13/2024 11:35 AM  History of Present Illness:  Patient is evaluated by phone session.  He reported doing better on current medications.  He had a good Christmas and spent time with his son.  He has chronic pain but so far no major issues.  He is trying to lose weight and trying to be more active in church activities.  He lost 7 pounds since the last visit.  He denies any crying spells or any feeling of hopelessness or worthlessness.  He cut down his Klonopin  and only taking trazodone  when he cannot sleep.  These meds are prescribed by his PCP.  He is taking Abilify  and Wellbutrin  which is actually helping his depression and anxiety.  He denies any panic attack.  Recently he has blood work and his TSH is normal.  His basic chemistry is also normal.  His hemoglobin A1c is 6.0 which is better than before and lipid panel shows normal triglycerides and LDL.  His BuSpar  trazodone  and Klonopin  is given by his PCP.  He denies any hallucination, paranoia or any agitation.  He denies drinking or using any illegal substances.  Past Psychiatric History: H/O depression and anxiety.  No h/o suicidal attempt or inpatient treatment.  Seen Dr. Kapoor and given Zoloft , Klonopin  and Cymbalta .  Later PCP added BuSpar  and trazodone .  We tried Lamictal  but did not help.    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   Allergy    Anxiety    Anxiety and depression    Arthritis    knees and shoulders   Benzodiazepine withdrawal with complication (HCC) 01/09/2019   Bruit    L   Chest pain    hx   Cognitive complaints with normal neuropsychological exam 10/23/2016   Diabetes mellitus without complication (HCC)    borderline, diet controlled, no meds, patient has lost 30 lbs   Edema    Fatty liver    GERD (gastroesophageal reflux disease)    uses Omeprazole    Goiter    HLD (hyperlipidemia)    Hypertension    essential, benign   Hypothyroidism    Impotence of organic origin    Murmur    never has caused any problems   Neuromuscular disorder (HCC)    neuropathy bilateral feet   Other chest pain    tightness, pressure   Palpitation    hx   Precordial pain    Sleep apnea    uses cpap    Outpatient Encounter Medications as of 05/13/2024  Medication Sig   Accu-Chek Softclix Lancets lancets Use as instructed   albuterol  (VENTOLIN  HFA) 108 (90 Base) MCG/ACT inhaler INHALE 2 PUFFS BY MOUTH EVERY 6 HOURS AS NEEDED FOR WHEEZING OR SHORTNESS OF BREATH   amLODipine  (NORVASC ) 2.5 MG tablet Take 1 tablet (2.5 mg total) by mouth daily.   ARIPiprazole  (ABILIFY ) 5  MG tablet Take one tab at bed time   aspirin  EC 81 MG tablet Take 1 tablet (81 mg total) by mouth daily at 6 PM. Swallow whole.   Blood Glucose Monitoring Suppl (ACCU-CHEK GUIDE) w/Device KIT Use to check sugars twice daily   buPROPion  (WELLBUTRIN  XL) 300 MG 24 hr tablet Take 1 tablet (300 mg total) by mouth daily.   busPIRone  (BUSPAR ) 15 MG tablet Take 1 tablet (15 mg total) by mouth 3 (three) times daily.   carvedilol  (COREG ) 25 MG tablet Take 1 tablet by mouth twice daily   celecoxib  (CELEBREX ) 100 MG capsule Take 1 capsule by mouth twice daily   clonazePAM  (KLONOPIN ) 0.5 MG tablet TAKE 1 TABLET BY MOUTH IN THE MORNING AND 1.5  TABLETS IN THE EVENING as  needed for anxiety   Continuous Glucose Sensor (DEXCOM G7 SENSOR) MISC Use to record occurrence of hypoglycemia in type 2 Diabetes   dicyclomine  (BENTYL ) 20 MG tablet TAKE 1 TABLET BY MOUTH EVERY 6 HOURS   ezetimibe  (ZETIA ) 10 MG tablet Take 1 tablet (10 mg total) by mouth daily.   furosemide  (LASIX ) 40 MG tablet Take 1 tablet (40 mg total) by mouth daily.   gabapentin  (NEURONTIN ) 300 MG capsule Take 3 capsules (900 mg total) by mouth 3 (three) times daily.   glucose blood (ACCU-CHEK AVIVA PLUS) test strip USE TO CHECK BLOOD SUGAR UP TO 4 TIMES DAILY.   glucose blood (ACCU-CHEK GUIDE TEST) test strip Use to check blood sugar 3 times daily.   hydrALAZINE  (APRESOLINE ) 50 MG tablet TAKE 1 TABLET BY MOUTH THREE TIMES DAILY   HYDROcodone -acetaminophen  (NORCO) 10-325 MG tablet Take 1-2 tablets by mouth daily as needed.   isosorbide  mononitrate (IMDUR ) 30 MG 24 hr tablet Take 1 tablet (30 mg total) by mouth daily.   JARDIANCE  10 MG TABS tablet Take 1 tablet (10 mg total) by mouth daily.   Lancets (ACCU-CHEK MULTICLIX) lancets Use to to check blood sugars up to 4 times daily. ICD-10: E11.40   levothyroxine  (SYNTHROID ) 88 MCG tablet Take 1 tablet (88 mcg total) by mouth daily before breakfast.   losartan  (COZAAR ) 100 MG tablet Take 1 tablet (100 mg total) by mouth daily.   metFORMIN  (GLUCOPHAGE ) 500 MG tablet Take 1 tablet (500 mg total) by mouth 2 (two) times daily with a meal.   methocarbamol  (ROBAXIN ) 500 MG tablet TAKE 1 TABLET BY MOUTH EVERY 8 HOURS AS NEEDED   Multiple Vitamin (MULTIVITAMIN WITH MINERALS) TABS tablet Take 1 tablet by mouth daily.   naloxone  (NARCAN ) nasal spray 4 mg/0.1 mL Use as needed for excessive sedation /opioid overdose   omeprazole  (PRILOSEC) 40 MG capsule TAKE 1 CAPSULE BY MOUTH IN THE MORNING AND 1 CAPSULE IN THE EVENING   promethazine  (PHENERGAN ) 12.5 MG tablet Take 1 tablet (12.5 mg total) by mouth every 4 (four) hours as needed for nausea or vomiting.   rosuvastatin   (CRESTOR ) 40 MG tablet Take 1 tablet (40 mg total) by mouth daily.   traZODone  (DESYREL ) 100 MG tablet TAKE 1 & 1/2 (ONE & ONE-HALF) TABLETS BY MOUTH AT BEDTIME   No facility-administered encounter medications on file as of 05/13/2024.    Recent Results (from the past 2160 hours)  Direct LDL     Status: None   Collection Time: 04/23/24 10:58 AM  Result Value Ref Range   Direct LDL 36.0 mg/dL    Comment: Optimal:  <899 mg/dLNear or Above Optimal:  100-129 mg/dLBorderline High:  130-159 mg/dLHigh:  160-189 mg/dLVery High:  >  190 mg/dL  Lipid Profile     Status: Abnormal   Collection Time: 04/23/24 10:58 AM  Result Value Ref Range   Cholesterol 70 28 - 200 mg/dL    Comment: ATP III Classification       Desirable:  < 200 mg/dL               Borderline High:  200 - 239 mg/dL          High:  > = 759 mg/dL   Triglycerides 06.9 89.9 - 149.0 mg/dL    Comment: Normal:  <849 mg/dLBorderline High:  150 - 199 mg/dL   HDL 69.59 (L) >60.99 mg/dL   VLDL 81.3 0.0 - 59.9 mg/dL   LDL Cholesterol 21 10 - 99 mg/dL   Total CHOL/HDL Ratio 2     Comment:                Men          Women1/2 Average Risk     3.4          3.3Average Risk          5.0          4.42X Average Risk          9.6          7.13X Average Risk          15.0          11.0                       NonHDL 39.95     Comment: NOTE:  Non-HDL goal should be 30 mg/dL higher than patient's LDL goal (i.e. LDL goal of < 70 mg/dL, would have non-HDL goal of < 100 mg/dL)  POCT HgB J8R     Status: Abnormal   Collection Time: 04/25/24  2:02 PM  Result Value Ref Range   Hemoglobin A1C 6.0 (A) 4.0 - 5.6 %   HbA1c POC (<> result, manual entry)     HbA1c, POC (prediabetic range)     HbA1c, POC (controlled diabetic range)    Comprehensive metabolic panel with GFR     Status: Abnormal   Collection Time: 04/25/24  2:48 PM  Result Value Ref Range   Glucose 81 70 - 99 mg/dL   BUN 14 6 - 24 mg/dL   Creatinine, Ser 9.11 0.76 - 1.27 mg/dL   eGFR 99 >40  fO/fpw/8.26   BUN/Creatinine Ratio 16 9 - 20   Sodium 143 134 - 144 mmol/L   Potassium 3.6 3.5 - 5.2 mmol/L   Chloride 99 96 - 106 mmol/L   CO2 30 (H) 20 - 29 mmol/L   Calcium  9.2 8.7 - 10.2 mg/dL   Total Protein 6.8 6.0 - 8.5 g/dL   Albumin  4.5 3.8 - 4.9 g/dL   Globulin, Total 2.3 1.5 - 4.5 g/dL   Bilirubin Total 0.6 0.0 - 1.2 mg/dL   Alkaline Phosphatase 81 47 - 123 IU/L   AST 24 0 - 40 IU/L   ALT 26 0 - 44 IU/L  TSH     Status: None   Collection Time: 04/25/24  2:48 PM  Result Value Ref Range   TSH 1.930 0.450 - 4.500 uIU/mL  GeneConnect Molecular Screen - Blood ([facility])     Status: None   Collection Time: 04/29/24  1:09 PM  Result Value Ref Range   Genetic Analysis Overall Interpretation Negative  Genetic Disease Assessed      This is a screening test and does not detect all pathogenic or likely pathogenic variant(s) in the tested genes; diagnostic testing is recommended for individuals with a personal or family history of heart disease or hereditary cancer. [facility] Tier One  Population Screen is a screening test that analyzes 11 genes related to hereditary breast and ovarian cancer (HBOC) syndrome, Lynch syndrome, and familial hypercholesterolemia. This test only reports clinically significant pathogenic and likely  pathogenic variants but does not report variants of uncertain significance (VUS). In addition, analysis of the PMS2 gene excludes exons 11-15, which overlap with a known pseudogene (PMS2CL).    Genetic Analysis Report      No pathogenic or likely pathogenic variants were detected in the genes analyzed by this test.Genetic test results should be interpreted in the context of an individual's personal medical and family history. Alteration to medical management is NOT  recommended based solely on this result. Clinical correlation is advised.Additional Considerations- This is a screening test; individuals may still carry pathogenic or likely pathogenic  variant(s) in the tested genes that are not detected by this test.-  For individuals at risk for these or other related conditions based on factors including personal or family history, diagnostic testing is recommended.- The absence of pathogenic or likely pathogenic variant(s) in the analyzed genes, while reassuring,  does not eliminate the possibility of a hereditary condition; there are other variants and genes associated with heart disease and hereditary cancer that are not included in this test.    Genes Tested See Notes     Comment: APOB, BRCA1, BRCA2, EPCAM, LDLR, LDLRAP1, PCSK9, PMS2, MLH1, MSH2, MSH6   Disclaimer See Notes     Comment: This test was developed and validated by [facility] This test has not been cleared or approved by the [place]  Food and Drug Administration (FDA). The [facility] laboratory is accredited by the College of American Pathologists (CAP) and certified under  the Clinical Laboratory Improvement Amendments (CLIA #: [id number]) to perform high-complexity clinical tests. This test is used for clinical purposes. It should not be regarded as investigational use only or for research use only.    Sequencing Location See Notes     Comment: Sequencing done at [facility] [address], [address], [place] [address] (CLIA# [id number])   Interpretation Methods and Limitations See Notes     Comment: Extracted DNA is enriched for targeted regions and then sequenced using the [facility] Exome+ (R) assay on an Illumina DNA sequencing system. Data is then aligned to a modified version of GRCh38 and all genes are analyzed using the MANE transcript and MANE  Plus Clinical transcript, when available. Small variant calling is completed using a customized version of [employer]'s DNAseq software, augmented by a proprietary small variant caller for difficult variants. Copy number variants (CNVs) are then called  using a proprietary bioinformatics pipeline based on depth analysis  with a comparison to similarly sequenced samples. Analysis of the PMS2 gene is limited to exons 1-10. Both the MSH2 Boland inversion (exons 1-7) and the BRCA2 Alu insertion are detected  by identifying discordant read-pairs spanning the breakpoints. The interpretation and reporting of variants in APOB, PCSK9, and LDLR is specific to familial hypercholesterolemia; variants associated with hypobetalipoproteinemia are not i ncluded.  Interpretation is based upon guidelines published by the [employer] and [employer]), the Association for Molecular Pathology (AMP) or their modification by [employer]  when available and/or review  of previous clinical assertions available in the [employer]. Interpretation is limited to the transcripts indicated on the report and +/- 10 bp into intronic regions, except as noted below. [facility] variant classifications include pathogenic, likely  pathogenic, variant of uncertain significance (VUS), likely benign, and benign. Only variants classified as pathogenic and likely pathogenic are included in the report. All reported variants are confirmed through secondary manual inspection of DNA  sequence data or orthogonal testing. Risk estimations and management guidelines included in this report are based on analysis of primary literature and recommendations of applicable professional societies, and should be regarded  as approximations.Based  on validation studies, this assay delivers > 99% sensitivity and specificity for single nucleotide variants and insertions and deletions (indels) up to 20 bp. Larger indels and complex variants are also reported but sensitivity may be reduced. Based on  validation studies, this assay delivers > 99% sensitivity to multi-exon CNVs and > 90% sensitivity to single-exon CNVs. This test may not detect variants in challenging regions (such as short tandem repeats, homopolymer runs, and segment  duplications),  sub-exonic CNVs, chromosomal aneuploidy, or variants in the presence of mosaicism. Phasing will be attempted and reported, when possible. Structural rearrangements such as inversions, translocations, complex rearrangements, and gene conversions are not  tested in this assay unless explicitly indicated. Additionally, deep intronic, promoter, and enhancer regions may not be covered. It is important to note that this is a screening test and cannot detect all di sease-causing variants. A negative result does  not guarantee the absence of a rare, undetectable variant in the genes analyzed; consider using a diagnostic test if there is significant personal and/or family history of one of the conditions analyzed by this test. Any potential incidental findings  outside of these genes and conditions will not be identified, nor reported. The results of a genetic test may be influenced by various factors, including bone marrow transplantation, blood transfusions, or in rare cases, hematolymphoid neoplasms.Gene  Specific Notes:APOB: analysis is limited to c.10580G>A and c.10579C>T; BRCA1: sequencing analysis extends to CDS +/-20 bp; BRCA2: analysis includes detection of c.[id number] and sequencing analysis extends to CDS +/-20 bp. EPCAM: analysis is limited  to CNV of exons 8-9; LDLR: analysis includes CNV of the promoter; MLH1: analysis includes CNV of the promoter; MSH2: analysis includes detection of the Boland inversion (inversion of exons 1 -7) and detection of c.942+3A>T, PMS2: analysis is limited to  exons 1-10.Joshua JINNY Kemp, PhD, [email or web address]      Psychiatric Specialty Exam: Physical Exam  Review of Systems  Musculoskeletal:        Joint pain    Weight 239 lb (108.4 kg).There is no height or weight on file to calculate BMI.  General Appearance: NA  Eye Contact:  NA  Speech:  Slow  Volume:  Normal  Mood:  Euthymic  Affect:  NA  Thought Process:  Goal Directed   Orientation:  Full (Time, Place, and Person)  Thought Content:  Logical  Suicidal Thoughts:  No  Homicidal Thoughts:  No  Memory:  Immediate;   Good Recent;   Good Remote;   Good  Judgement:  Intact  Insight:  Good  Psychomotor Activity:  NA  Concentration:  Concentration: Good and Attention Span: Good  Recall:  Good  Fund of Knowledge:  Good  Language:  Good  Akathisia:  No  Handed:  Right  AIMS (if indicated):     Assets:  Communication Skills Desire for Improvement Housing Social  Support Transportation  ADL's:  Intact  Cognition:  WNL  Sleep:  ok       04/25/2024    2:04 PM 03/04/2024    3:29 PM 01/16/2024    1:31 PM 11/09/2023    4:29 PM 11/06/2023   11:32 AM  Depression screen PHQ 2/9  Decreased Interest 1 0 1 0 0  Down, Depressed, Hopeless 1 1 2  0 1  PHQ - 2 Score 2 1 3  0 1  Altered sleeping 0 0 0 0 0  Tired, decreased energy 1 1 1  0 0  Change in appetite 0 0 0 0 0  Feeling bad or failure about yourself  1 0 0 0 0  Trouble concentrating 1 0 1 0 0  Moving slowly or fidgety/restless 0 0 0 0 0  Suicidal thoughts 0 0 0 0 0  PHQ-9 Score 5 2 5   0  1   Difficult doing work/chores Not difficult at all Not difficult at all Somewhat difficult Not difficult at all Not difficult at all     Data saved with a previous flowsheet row definition    Assessment/Plan: MDD (major depressive disorder), recurrent episode, moderate (HCC) - Plan: ARIPiprazole  (ABILIFY ) 5 MG tablet, buPROPion  (WELLBUTRIN  XL) 300 MG 24 hr tablet  Anxiety - Plan: buPROPion  (WELLBUTRIN  XL) 300 MG 24 hr tablet  Patient is a 59 year old man with major depressive disorder, anxiety with comorbidity of hypertension, DVT, diabetes, chronic pain.  Review collateral information, current medication and blood work results.  Labs are stable and improved from the past.  His symptoms are much stable.  He cut down his Klonopin  and trazodone  and only taking when he needed which is prescribed by PCP.  He reported  Wellbutrin  and Abilify  combination is doing the job.  Will keep the Abilify  5 mg daily and Wellbutrin  XL 300 mg daily.  Encouraged to call back if is any question or any concern.  Will follow-up in 6 months but patient can be seen sooner if needed.  Discussed polypharmacy as patient is trying to cut down his Klonopin  and trazodone .  Encourage walking, exercise, healthy lifestyle.  He is involved in church activities.   Follow Up Instructions:     I discussed the assessment and treatment plan with the patient. The patient was provided an opportunity to ask questions and all were answered. The patient agreed with the plan and demonstrated an understanding of the instructions.   The patient was advised to call back or seek an in-person evaluation if the symptoms worsen or if the condition fails to improve as anticipated.    Collaboration of Care: Other provider involved in patient's care AEB notes are available in epic to review.  Review labs from PCP.  Patient/Guardian was advised Release of Information must be obtained prior to any record release in order to collaborate their care with an outside provider. Patient/Guardian was advised if they have not already done so to contact the registration department to sign all necessary forms in order for us  to release information regarding their care.   Consent: Patient/Guardian gives verbal consent for treatment and assignment of benefits for services provided during this visit. Patient/Guardian expressed understanding and agreed to proceed.     Total encounter time 31 minutes which includes, chart reviewed, obtain history, examination, reviewing and communication results, placing orders, care coordination and documentation clinical information in EHR during this encounter.   Note: This document was prepared by [employer] voice dictation technology and any errors that results  from this process are unintentional.    Leni ONEIDA Client, MD 05/13/2024    "

## 2024-05-13 NOTE — Telephone Encounter (Signed)
 Requesting: Hydrocodone  Contract: Yes last one 05/30/2022 UDS: no Last Visit: 04/25/2024 Next Visit: 07/28/2024 Last Refill: 04/29/2024  Please Advise

## 2024-05-14 ENCOUNTER — Telehealth: Payer: Self-pay | Admitting: Internal Medicine

## 2024-05-14 NOTE — Telephone Encounter (Signed)
 Spoke with pt and he stated that he does not want to use this company to get his dexcom g7 sensors. Spoke with pharmacy to let them know and they stated they would deactivate.

## 2024-05-14 NOTE — Telephone Encounter (Signed)
 Copied from CRM #[id number]. Topic: General - Other >> May 14, 2024 12:21 PM Vena HERO wrote: Reason for CRM: Mitzie from [facility] [phone], called in to verify we received a fax for this patient in reference to the Dexcom G7 that was send on 01/12, 01/13 and 01/14. I called [employer] but could not verify it was received so I asked Mitzie to send it again. Please call with any questions or concerns

## 2024-05-25 ENCOUNTER — Other Ambulatory Visit: Payer: Self-pay | Admitting: Internal Medicine

## 2024-05-30 NOTE — Telephone Encounter (Signed)
 Spoke with pt and he stated that he is taking 2.5 mg amlodipine  and 50 mg hydralazine .

## 2024-07-28 ENCOUNTER — Ambulatory Visit: Admitting: Internal Medicine

## 2024-11-10 ENCOUNTER — Telehealth (HOSPITAL_COMMUNITY): Admitting: Psychiatry

## 2025-03-10 ENCOUNTER — Ambulatory Visit
